# Patient Record
Sex: Female | Born: 1935 | Race: White | Hispanic: No | Marital: Married | Smoking: Never smoker
Health system: Southern US, Community
[De-identification: ages and names within clinical notes are randomized; demographics above are authoritative.]

## PROBLEM LIST (undated history)

## (undated) DIAGNOSIS — H9190 Unspecified hearing loss, unspecified ear: Secondary | ICD-10-CM

## (undated) DIAGNOSIS — K219 Gastro-esophageal reflux disease without esophagitis: Secondary | ICD-10-CM

## (undated) DIAGNOSIS — E785 Hyperlipidemia, unspecified: Secondary | ICD-10-CM

## (undated) DIAGNOSIS — R32 Unspecified urinary incontinence: Secondary | ICD-10-CM

## (undated) DIAGNOSIS — F32A Depression, unspecified: Secondary | ICD-10-CM

## (undated) DIAGNOSIS — I119 Hypertensive heart disease without heart failure: Secondary | ICD-10-CM

## (undated) DIAGNOSIS — F329 Major depressive disorder, single episode, unspecified: Secondary | ICD-10-CM

## (undated) DIAGNOSIS — R2689 Other abnormalities of gait and mobility: Secondary | ICD-10-CM

## (undated) DIAGNOSIS — G629 Polyneuropathy, unspecified: Secondary | ICD-10-CM

## (undated) DIAGNOSIS — Z973 Presence of spectacles and contact lenses: Secondary | ICD-10-CM

## (undated) DIAGNOSIS — J441 Chronic obstructive pulmonary disease with (acute) exacerbation: Secondary | ICD-10-CM

## (undated) DIAGNOSIS — IMO0001 Reserved for inherently not codable concepts without codable children: Secondary | ICD-10-CM

## (undated) DIAGNOSIS — D128 Benign neoplasm of rectum: Secondary | ICD-10-CM

## (undated) DIAGNOSIS — E2749 Other adrenocortical insufficiency: Secondary | ICD-10-CM

## (undated) DIAGNOSIS — J42 Unspecified chronic bronchitis: Secondary | ICD-10-CM

## (undated) DIAGNOSIS — I10 Essential (primary) hypertension: Secondary | ICD-10-CM

## (undated) DIAGNOSIS — G20A1 Parkinson's disease without dyskinesia, without mention of fluctuations: Secondary | ICD-10-CM

## (undated) DIAGNOSIS — G20 Parkinson's disease: Secondary | ICD-10-CM

## (undated) DIAGNOSIS — Z972 Presence of dental prosthetic device (complete) (partial): Secondary | ICD-10-CM

## (undated) DIAGNOSIS — IMO0002 Reserved for concepts with insufficient information to code with codable children: Secondary | ICD-10-CM

## (undated) DIAGNOSIS — J449 Chronic obstructive pulmonary disease, unspecified: Secondary | ICD-10-CM

## (undated) DIAGNOSIS — M1990 Unspecified osteoarthritis, unspecified site: Secondary | ICD-10-CM

## (undated) HISTORY — DX: Chronic obstructive pulmonary disease with (acute) exacerbation: J44.1

## (undated) HISTORY — DX: Reserved for concepts with insufficient information to code with codable children: IMO0002

## (undated) HISTORY — PX: FINGER SURGERY: SHX640

## (undated) HISTORY — PX: APPENDECTOMY: SHX54

## (undated) HISTORY — PX: RECTAL SURGERY: SHX760

## (undated) HISTORY — DX: Hypertensive heart disease without heart failure: I11.9

## (undated) HISTORY — PX: INCONTINENCE SURGERY: SHX676

## (undated) HISTORY — DX: Benign neoplasm of rectum: D12.8

## (undated) HISTORY — PX: DILATION AND CURETTAGE OF UTERUS: SHX78

## (undated) HISTORY — PX: EYE SURGERY: SHX253

## (undated) HISTORY — PX: TONSILLECTOMY: SUR1361

## (undated) HISTORY — DX: Presence of dental prosthetic device (complete) (partial): Z97.2

## (undated) HISTORY — DX: Unspecified urinary incontinence: R32

## (undated) HISTORY — DX: Essential (primary) hypertension: I10

## (undated) HISTORY — PX: ABDOMINAL HYSTERECTOMY: SHX81

## (undated) HISTORY — DX: Hyperlipidemia, unspecified: E78.5

---

## 1998-02-07 ENCOUNTER — Emergency Department (HOSPITAL_COMMUNITY): Admission: EM | Admit: 1998-02-07 | Discharge: 1998-02-07 | Payer: Self-pay | Admitting: Emergency Medicine

## 1998-05-01 ENCOUNTER — Ambulatory Visit (HOSPITAL_COMMUNITY): Admission: RE | Admit: 1998-05-01 | Discharge: 1998-05-01 | Payer: Self-pay | Admitting: Internal Medicine

## 1999-03-18 ENCOUNTER — Ambulatory Visit (HOSPITAL_BASED_OUTPATIENT_CLINIC_OR_DEPARTMENT_OTHER): Admission: RE | Admit: 1999-03-18 | Discharge: 1999-03-18 | Payer: Self-pay | Admitting: Orthopedic Surgery

## 1999-05-20 ENCOUNTER — Ambulatory Visit (HOSPITAL_COMMUNITY): Admission: RE | Admit: 1999-05-20 | Discharge: 1999-05-20 | Payer: Self-pay | Admitting: Internal Medicine

## 1999-05-20 ENCOUNTER — Encounter: Payer: Self-pay | Admitting: Internal Medicine

## 1999-10-18 ENCOUNTER — Encounter: Payer: Self-pay | Admitting: Emergency Medicine

## 1999-10-18 ENCOUNTER — Emergency Department (HOSPITAL_COMMUNITY): Admission: EM | Admit: 1999-10-18 | Discharge: 1999-10-18 | Payer: Self-pay | Admitting: Emergency Medicine

## 2000-09-21 ENCOUNTER — Ambulatory Visit (HOSPITAL_COMMUNITY): Admission: RE | Admit: 2000-09-21 | Discharge: 2000-09-21 | Payer: Self-pay | Admitting: Internal Medicine

## 2000-09-21 ENCOUNTER — Encounter: Payer: Self-pay | Admitting: Internal Medicine

## 2000-09-28 ENCOUNTER — Ambulatory Visit (HOSPITAL_BASED_OUTPATIENT_CLINIC_OR_DEPARTMENT_OTHER): Admission: RE | Admit: 2000-09-28 | Discharge: 2000-09-28 | Payer: Self-pay | Admitting: Orthopedic Surgery

## 2001-06-15 ENCOUNTER — Encounter: Payer: Self-pay | Admitting: Internal Medicine

## 2001-06-15 ENCOUNTER — Encounter: Admission: RE | Admit: 2001-06-15 | Discharge: 2001-06-15 | Payer: Self-pay | Admitting: Internal Medicine

## 2001-09-27 ENCOUNTER — Ambulatory Visit (HOSPITAL_COMMUNITY): Admission: RE | Admit: 2001-09-27 | Discharge: 2001-09-27 | Payer: Self-pay | Admitting: Internal Medicine

## 2001-09-27 ENCOUNTER — Encounter: Payer: Self-pay | Admitting: Internal Medicine

## 2001-10-03 ENCOUNTER — Ambulatory Visit (HOSPITAL_COMMUNITY): Admission: RE | Admit: 2001-10-03 | Discharge: 2001-10-03 | Payer: Self-pay | Admitting: Gastroenterology

## 2002-09-28 ENCOUNTER — Encounter: Payer: Self-pay | Admitting: Internal Medicine

## 2002-09-28 ENCOUNTER — Ambulatory Visit (HOSPITAL_COMMUNITY): Admission: RE | Admit: 2002-09-28 | Discharge: 2002-09-28 | Payer: Self-pay | Admitting: Internal Medicine

## 2003-04-27 ENCOUNTER — Encounter: Payer: Self-pay | Admitting: Preventative Medicine

## 2003-04-27 ENCOUNTER — Ambulatory Visit (HOSPITAL_COMMUNITY): Admission: RE | Admit: 2003-04-27 | Discharge: 2003-04-27 | Payer: Self-pay | Admitting: Preventative Medicine

## 2003-10-02 ENCOUNTER — Ambulatory Visit (HOSPITAL_COMMUNITY): Admission: RE | Admit: 2003-10-02 | Discharge: 2003-10-02 | Payer: Self-pay | Admitting: Internal Medicine

## 2003-10-08 ENCOUNTER — Ambulatory Visit (HOSPITAL_BASED_OUTPATIENT_CLINIC_OR_DEPARTMENT_OTHER): Admission: RE | Admit: 2003-10-08 | Discharge: 2003-10-08 | Payer: Self-pay | Admitting: Urology

## 2003-10-08 ENCOUNTER — Ambulatory Visit (HOSPITAL_COMMUNITY): Admission: RE | Admit: 2003-10-08 | Discharge: 2003-10-08 | Payer: Self-pay | Admitting: Urology

## 2004-03-10 ENCOUNTER — Ambulatory Visit (HOSPITAL_COMMUNITY): Admission: RE | Admit: 2004-03-10 | Discharge: 2004-03-10 | Payer: Self-pay | Admitting: Family Medicine

## 2004-10-08 ENCOUNTER — Ambulatory Visit (HOSPITAL_COMMUNITY): Admission: RE | Admit: 2004-10-08 | Discharge: 2004-10-08 | Payer: Self-pay | Admitting: Internal Medicine

## 2004-12-28 ENCOUNTER — Emergency Department (HOSPITAL_COMMUNITY): Admission: EM | Admit: 2004-12-28 | Discharge: 2004-12-28 | Payer: Self-pay | Admitting: Emergency Medicine

## 2005-01-05 ENCOUNTER — Ambulatory Visit (HOSPITAL_COMMUNITY): Admission: RE | Admit: 2005-01-05 | Discharge: 2005-01-05 | Payer: Self-pay | Admitting: Internal Medicine

## 2005-06-25 ENCOUNTER — Ambulatory Visit (HOSPITAL_COMMUNITY): Admission: RE | Admit: 2005-06-25 | Discharge: 2005-06-25 | Payer: Self-pay | Admitting: Internal Medicine

## 2005-07-29 ENCOUNTER — Ambulatory Visit (HOSPITAL_COMMUNITY): Admission: RE | Admit: 2005-07-29 | Discharge: 2005-07-29 | Payer: Self-pay | Admitting: Internal Medicine

## 2005-08-10 ENCOUNTER — Ambulatory Visit: Payer: Self-pay | Admitting: Internal Medicine

## 2005-08-19 ENCOUNTER — Ambulatory Visit: Payer: Self-pay | Admitting: *Deleted

## 2005-08-31 ENCOUNTER — Ambulatory Visit: Payer: Self-pay | Admitting: Internal Medicine

## 2005-09-09 ENCOUNTER — Ambulatory Visit (HOSPITAL_COMMUNITY): Admission: RE | Admit: 2005-09-09 | Discharge: 2005-09-09 | Payer: Self-pay | Admitting: Internal Medicine

## 2005-09-15 ENCOUNTER — Ambulatory Visit: Payer: Self-pay | Admitting: Internal Medicine

## 2005-09-17 ENCOUNTER — Ambulatory Visit (HOSPITAL_COMMUNITY): Admission: RE | Admit: 2005-09-17 | Discharge: 2005-09-17 | Payer: Self-pay | Admitting: Internal Medicine

## 2005-09-29 ENCOUNTER — Ambulatory Visit: Payer: Self-pay | Admitting: Internal Medicine

## 2005-10-06 ENCOUNTER — Ambulatory Visit: Payer: Self-pay | Admitting: Internal Medicine

## 2005-10-27 ENCOUNTER — Ambulatory Visit: Payer: Self-pay | Admitting: Internal Medicine

## 2005-11-23 ENCOUNTER — Ambulatory Visit: Payer: Self-pay | Admitting: Internal Medicine

## 2005-12-15 ENCOUNTER — Ambulatory Visit: Payer: Self-pay | Admitting: Internal Medicine

## 2006-02-15 ENCOUNTER — Ambulatory Visit: Payer: Self-pay | Admitting: Internal Medicine

## 2006-03-09 ENCOUNTER — Ambulatory Visit (HOSPITAL_COMMUNITY): Admission: RE | Admit: 2006-03-09 | Discharge: 2006-03-09 | Payer: Self-pay | Admitting: Internal Medicine

## 2006-06-14 ENCOUNTER — Ambulatory Visit (HOSPITAL_COMMUNITY): Admission: RE | Admit: 2006-06-14 | Discharge: 2006-06-15 | Payer: Self-pay | Admitting: Urology

## 2006-08-16 ENCOUNTER — Ambulatory Visit: Payer: Self-pay | Admitting: Internal Medicine

## 2006-08-26 ENCOUNTER — Ambulatory Visit: Payer: Self-pay | Admitting: Gastroenterology

## 2006-08-31 ENCOUNTER — Ambulatory Visit (HOSPITAL_BASED_OUTPATIENT_CLINIC_OR_DEPARTMENT_OTHER): Admission: RE | Admit: 2006-08-31 | Discharge: 2006-09-01 | Payer: Self-pay | Admitting: Orthopedic Surgery

## 2006-09-06 ENCOUNTER — Encounter: Payer: Self-pay | Admitting: Gastroenterology

## 2006-09-06 ENCOUNTER — Ambulatory Visit: Payer: Self-pay | Admitting: Gastroenterology

## 2006-09-14 ENCOUNTER — Ambulatory Visit (HOSPITAL_COMMUNITY): Admission: RE | Admit: 2006-09-14 | Discharge: 2006-09-14 | Payer: Self-pay | Admitting: Gastroenterology

## 2006-09-27 ENCOUNTER — Ambulatory Visit: Payer: Self-pay | Admitting: Gastroenterology

## 2006-10-19 DIAGNOSIS — R2689 Other abnormalities of gait and mobility: Secondary | ICD-10-CM

## 2006-10-19 HISTORY — DX: Other abnormalities of gait and mobility: R26.89

## 2007-03-21 ENCOUNTER — Ambulatory Visit (HOSPITAL_COMMUNITY): Admission: RE | Admit: 2007-03-21 | Discharge: 2007-03-21 | Payer: Self-pay | Admitting: Internal Medicine

## 2007-04-09 ENCOUNTER — Emergency Department (HOSPITAL_COMMUNITY): Admission: EM | Admit: 2007-04-09 | Discharge: 2007-04-09 | Payer: Self-pay | Admitting: *Deleted

## 2008-03-21 ENCOUNTER — Ambulatory Visit (HOSPITAL_COMMUNITY): Admission: RE | Admit: 2008-03-21 | Discharge: 2008-03-21 | Payer: Self-pay | Admitting: Internal Medicine

## 2008-03-29 ENCOUNTER — Encounter: Admission: RE | Admit: 2008-03-29 | Discharge: 2008-03-29 | Payer: Self-pay | Admitting: Internal Medicine

## 2008-04-11 ENCOUNTER — Encounter: Admission: RE | Admit: 2008-04-11 | Discharge: 2008-04-11 | Payer: Self-pay | Admitting: Neurology

## 2008-06-07 ENCOUNTER — Encounter (HOSPITAL_COMMUNITY): Admission: RE | Admit: 2008-06-07 | Discharge: 2008-07-07 | Payer: Self-pay | Admitting: Neurology

## 2008-07-10 ENCOUNTER — Encounter (HOSPITAL_COMMUNITY): Admission: RE | Admit: 2008-07-10 | Discharge: 2008-07-18 | Payer: Self-pay | Admitting: Neurology

## 2008-07-19 ENCOUNTER — Encounter (HOSPITAL_COMMUNITY): Admission: RE | Admit: 2008-07-19 | Discharge: 2008-08-18 | Payer: Self-pay | Admitting: Neurology

## 2008-09-03 ENCOUNTER — Ambulatory Visit (HOSPITAL_COMMUNITY): Admission: RE | Admit: 2008-09-03 | Discharge: 2008-09-03 | Payer: Self-pay | Admitting: Internal Medicine

## 2009-04-15 ENCOUNTER — Ambulatory Visit (HOSPITAL_COMMUNITY): Admission: RE | Admit: 2009-04-15 | Discharge: 2009-04-15 | Payer: Self-pay | Admitting: Internal Medicine

## 2009-07-03 ENCOUNTER — Encounter (INDEPENDENT_AMBULATORY_CARE_PROVIDER_SITE_OTHER): Payer: Self-pay | Admitting: General Surgery

## 2009-07-03 ENCOUNTER — Ambulatory Visit (HOSPITAL_COMMUNITY): Admission: RE | Admit: 2009-07-03 | Discharge: 2009-07-03 | Payer: Self-pay | Admitting: General Surgery

## 2009-07-18 ENCOUNTER — Encounter: Admission: RE | Admit: 2009-07-18 | Discharge: 2009-07-18 | Payer: Self-pay | Admitting: General Surgery

## 2009-08-21 ENCOUNTER — Inpatient Hospital Stay (HOSPITAL_COMMUNITY): Admission: EM | Admit: 2009-08-21 | Discharge: 2009-08-22 | Payer: Self-pay | Admitting: Emergency Medicine

## 2010-04-16 ENCOUNTER — Ambulatory Visit (HOSPITAL_COMMUNITY): Admission: RE | Admit: 2010-04-16 | Discharge: 2010-04-16 | Payer: Self-pay | Admitting: Internal Medicine

## 2010-11-08 ENCOUNTER — Encounter: Payer: Self-pay | Admitting: Internal Medicine

## 2010-11-09 ENCOUNTER — Encounter: Payer: Self-pay | Admitting: Internal Medicine

## 2011-01-15 ENCOUNTER — Other Ambulatory Visit: Payer: Self-pay

## 2011-01-21 LAB — LIPID PANEL
Cholesterol: 179 mg/dL (ref 0–200)
LDL Cholesterol: 85 mg/dL (ref 0–99)
Triglycerides: 60 mg/dL (ref <150)
VLDL: 12 mg/dL (ref 0–40)

## 2011-01-21 LAB — POCT I-STAT, CHEM 8
Calcium, Ion: 1.06 mmol/L — ABNORMAL LOW (ref 1.12–1.32)
Glucose, Bld: 101 mg/dL — ABNORMAL HIGH (ref 70–99)
HCT: 50 % — ABNORMAL HIGH (ref 36.0–46.0)
Hemoglobin: 17 g/dL — ABNORMAL HIGH (ref 12.0–15.0)
TCO2: 26 mmol/L (ref 0–100)

## 2011-01-21 LAB — CBC
HCT: 48 % — ABNORMAL HIGH (ref 36.0–46.0)
Hemoglobin: 16.4 g/dL — ABNORMAL HIGH (ref 12.0–15.0)
MCHC: 34.1 g/dL (ref 30.0–36.0)
MCV: 92.2 fL (ref 78.0–100.0)
Platelets: 241 10*3/uL (ref 150–400)
RBC: 5.21 MIL/uL — ABNORMAL HIGH (ref 3.87–5.11)
RDW: 13.7 % (ref 11.5–15.5)
WBC: 10.1 10*3/uL (ref 4.0–10.5)

## 2011-01-21 LAB — CARDIAC PANEL(CRET KIN+CKTOT+MB+TROPI)
CK, MB: 2.4 ng/mL (ref 0.3–4.0)
Relative Index: INVALID (ref 0.0–2.5)
Troponin I: 0.03 ng/mL (ref 0.00–0.06)

## 2011-01-21 LAB — POCT CARDIAC MARKERS: Troponin i, poc: <0.05 ng/mL (ref 0.00–0.09)

## 2011-01-21 LAB — DIFFERENTIAL
Basophils Absolute: 0 10*3/uL (ref 0.0–0.1)
Basophils Relative: 0 % (ref 0–1)
Eosinophils Absolute: 0 10*3/uL (ref 0.0–0.7)
Eosinophils Relative: 0 % (ref 0–5)
Monocytes Absolute: 0.6 10*3/uL (ref 0.1–1.0)
Monocytes Relative: 6 % (ref 3–12)
Neutro Abs: 7.8 10*3/uL — ABNORMAL HIGH (ref 1.7–7.7)

## 2011-01-21 LAB — HEMOGLOBIN A1C
Hgb A1c MFr Bld: 5.8 % (ref 4.6–6.1)
Mean Plasma Glucose: 120 mg/dL

## 2011-01-21 LAB — D-DIMER, QUANTITATIVE: D-Dimer, Quant: 3.03 ug/mL-FEU — ABNORMAL HIGH (ref 0.00–0.48)

## 2011-01-21 LAB — CK TOTAL AND CKMB (NOT AT ARMC): CK, MB: 2.6 ng/mL (ref 0.3–4.0)

## 2011-01-23 LAB — DIFFERENTIAL
Lymphocytes Relative: 30 % (ref 12–46)
Monocytes Absolute: 0.5 10*3/uL (ref 0.1–1.0)
Monocytes Relative: 7 % (ref 3–12)
Neutro Abs: 3.4 10*3/uL (ref 1.7–7.7)

## 2011-01-23 LAB — CBC
HCT: 42 % (ref 36.0–46.0)
Platelets: 259 10*3/uL (ref 150–400)
RDW: 13.6 % (ref 11.5–15.5)

## 2011-01-23 LAB — COMPREHENSIVE METABOLIC PANEL
AST: 21 U/L (ref 0–37)
Albumin: 3.9 g/dL (ref 3.5–5.2)
BUN: 18 mg/dL (ref 6–23)
Creatinine, Ser: 1.07 mg/dL (ref 0.4–1.2)
GFR calc Af Amer: >60 mL/min (ref >60)
Potassium: 3.7 mEq/L (ref 3.5–5.1)
Total Protein: 5.7 g/dL — ABNORMAL LOW (ref 6.0–8.3)

## 2011-02-03 ENCOUNTER — Other Ambulatory Visit (HOSPITAL_COMMUNITY): Payer: Self-pay | Admitting: Internal Medicine

## 2011-02-03 ENCOUNTER — Ambulatory Visit (HOSPITAL_COMMUNITY)
Admission: RE | Admit: 2011-02-03 | Discharge: 2011-02-03 | Disposition: A | Discharge status: Home / Self Care | Payer: Medicare Other | Source: Ambulatory Visit | Attending: Internal Medicine | Admitting: Internal Medicine

## 2011-02-03 DIAGNOSIS — I10 Essential (primary) hypertension: Secondary | ICD-10-CM | POA: Insufficient documentation

## 2011-02-03 DIAGNOSIS — J4489 Other specified chronic obstructive pulmonary disease: Secondary | ICD-10-CM | POA: Insufficient documentation

## 2011-02-03 DIAGNOSIS — Z0000 Encounter for general adult medical examination without abnormal findings: Secondary | ICD-10-CM | POA: Insufficient documentation

## 2011-02-03 DIAGNOSIS — J449 Chronic obstructive pulmonary disease, unspecified: Secondary | ICD-10-CM | POA: Insufficient documentation

## 2011-02-18 ENCOUNTER — Other Ambulatory Visit (HOSPITAL_COMMUNITY): Payer: Self-pay | Admitting: Urology

## 2011-02-18 DIAGNOSIS — R109 Unspecified abdominal pain: Secondary | ICD-10-CM

## 2011-02-23 ENCOUNTER — Ambulatory Visit (HOSPITAL_COMMUNITY)
Admission: RE | Admit: 2011-02-23 | Discharge: 2011-02-23 | Disposition: A | Discharge status: Home / Self Care | Payer: Medicare Other | Source: Ambulatory Visit | Attending: Urology | Admitting: Urology

## 2011-02-23 ENCOUNTER — Other Ambulatory Visit (HOSPITAL_COMMUNITY): Payer: Self-pay | Admitting: Urology

## 2011-02-23 DIAGNOSIS — N949 Unspecified condition associated with female genital organs and menstrual cycle: Secondary | ICD-10-CM | POA: Insufficient documentation

## 2011-02-23 DIAGNOSIS — Z90710 Acquired absence of both cervix and uterus: Secondary | ICD-10-CM | POA: Insufficient documentation

## 2011-02-23 DIAGNOSIS — K5730 Diverticulosis of large intestine without perforation or abscess without bleeding: Secondary | ICD-10-CM | POA: Insufficient documentation

## 2011-02-23 DIAGNOSIS — R109 Unspecified abdominal pain: Secondary | ICD-10-CM

## 2011-03-06 ENCOUNTER — Encounter (INDEPENDENT_AMBULATORY_CARE_PROVIDER_SITE_OTHER): Payer: Self-pay | Admitting: General Surgery

## 2011-03-06 NOTE — Procedures (Signed)
[facility]  Patient:    Marie [name], Marie [name] Visit Number: [id number] MRN: [id number]          Service Type: END Location: ENDO Attending Physician:  Louie Bun Dictated by:   Everardo All Madilyn Fireman, M.D. Proc. Date: 10/03/01 Admit Date:  10/03/2001   CC:         [name], M.D.   Procedure Report  PROCEDURE:  Colonoscopy.  SURGEON:  John C. Madilyn Fireman, M.D.  INDICATIONS FOR PROCEDURE:  Screening colonoscopy in a 75 year old patient.  DESCRIPTION OF PROCEDURE:  The patient was placed in the left lateral decubitus position and placed on the pulse monitor with continuous low flow oxygen delivered by nasal cannula.  She was sedated with 100 mcg and IV fentanyl and 10 mg of IV Versed.  The Olympus video colonoscope was inserted into the rectum and advanced with difficulty due to looping and tortuosity to the mid ascending colon.  I could see the ileocecal valve and about two-thirds to the cecum beyond it, but could not advance all the way deep into the cecum, and thus, view of the cecum was somewhat limited.  The visualized areas of the cecum appeared normal as did the ascending, transverse, descending, and sigmoid colon.  No masses, polyps, diverticula, or other mucosal abnormalities were noted.  The rectum likewise appeared normal and a retroflexed view of the anus revealed no obviously enlarged internal hemorrhoids.  The colonoscope was then withdrawn and the patient returned to the recovery room in stable condition.  She tolerated the procedure well and there were no immediate complications.  IMPRESSION:  Essentially normal colonoscopy with limited view to the cecum.  PLAN:  Flexible sigmoidoscopy in five years. Dictated by:   Everardo All Madilyn Fireman, M.D. Attending Physician:  Louie Bun DD:  10/03/01 TD:  10/04/01 Job: [id number]

## 2011-03-06 NOTE — Assessment & Plan Note (Signed)
[facility]                           GASTROENTEROLOGY OFFICE NOTE   NAME:HYLERShagun, Wordell                     MRN:          [id number]  DATE:08/26/2006                            DOB:          06-30-1936    REASON FOR CONSULTATION:  Chronic cough.  Mrs. Bertram is a pleasant 75-year-  old white female referred through the courtesy of Dr. Sherene Sires and Dr. Maple Hudson for  evaluation.  She has been suffering from a chronic nonproductive cough.  With severe coughing, she may regurgitate some gastric contents.  She  complains of a tickling sensation when she swallows, which sets off her  cough.  She denies pyrosis.  She takes omeprazole twice a day.  Recent  barium swallow demonstrated a Schatzki's ring and severe dysmotility.  There  was no obstruction to a barium pill.  She has occasional hoarseness and a  sore throat.   PAST MEDICAL HISTORY:  Pertinent for hypertension.  She has degenerative  arthritis.  Has had joint replacements in her hand.  She is status post  hysterectomy and appendectomy.   FAMILY HISTORY:  Pertinent for a sister with ovarian cancer and father with  heart disease.   MEDICATIONS:  1. Omeprazole 20 mg q.a.m. and nightly.  2. Simvastatin.  3. Estradiol.  4. Hydrochlorothiazide.  5. Baby aspirin.  6. Bisoprolol.  7. Advair.   She has no allergies.   She neither smokes nor drinks.  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   REVIEW OF SYSTEMS:  Positive for joint pains and fatigue.   EXAM:  Pulse 80, blood pressure 110/70, weight 164.   PHYSICAL EXAMINATION:  HEENT:  EOMI. PERRLA. Sclerae are anicteric.  Conjunctivae are pink.  NECK:  Supple without thyromegaly, adenopathy or carotid bruits.  CHEST:  Clear to auscultation and percussion without adventitious sounds.  CARDIAC:  Regular rhythm; normal S1 S2.  There are no murmurs, gallops or  rubs.  ABDOMEN:  Bowel sounds are normoactive.  Abdomen is soft, non-tender and non-  distended.  There are no abdominal masses, tenderness, splenic enlargement  or hepatomegaly.  EXTREMITIES:  Full range of motion.  No cyanosis, clubbing or edema.  RECTAL:  Deferred.   IMPRESSION:  Chronic cough.  This could be due to esophageal irritation from  acid reflux.  She does have a distal esophageal stricture (Schatzki's ring)  though it does not appear to be obstructing.  She appears to have  dysmotility, which could be causing some mild aspiration.   RECOMMENDATION:  1. Switch omeprazole to 20 mg q.a.m. and 20 mg 1/2 hour prior to dinner.  2. Upper endoscopy with dilatation if indicated.  3. To consider dysphagia study to determine whether she has any      aspiration.     Barbette Hair. Arlyce Dice, MD,FACG  Electronically Signed    RDK/MedQ  DD: 08/26/2006  DT: 08/26/2006  Job #: [id number]

## 2011-03-06 NOTE — Op Note (Signed)
NAMEJONEL, Marie [name]              ACCOUNT NO.:  [id number]   MEDICAL RECORD NO.:  [id number]          PATIENT TYPE:  AMB   LOCATION:  DSC                          FACILITY:  [facility]   PHYSICIAN:  Katy Fitch. Sypher, M.D. DATE OF BIRTH:  28-Feb-1936   DATE OF PROCEDURE:  08/31/2006  DATE OF DISCHARGE:                                 OPERATIVE REPORT   PREOPERATIVE DIAGNOSES:  1. 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  2. 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   POSTOPERATIVE DIAGNOSES:  1. 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  2. 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   OPERATION:  1. Resection of left trapezium with synovectomy and removal of loose      bodies.  2. Tightrope suspension plasty operation.  3. Suspending left thumb off of index metacarpal.  4. Reconstruction of thumb index intermetacarpal ligament utilizing a free      palmaris longus tendon graft.  5. His arthrodesis of left long finger proximal interphalangeal joint.  6. Injection of right thumb carpometacarpal joint with Depo-Medrol and      lidocaine.   OPERATING SURGEON:  Josephine Igo, MD   ASSISTANT:  Annye Rusk PA-C.   ANESTHESIA:  General by LMA.   SUPERVISING ANESTHESIOLOGIST:  Dr. Krista Blue.   INDICATIONS:  Marie [name] is 75 year old right-hand-dominant woman well-  known to our practice.  She has had multiple prior procedures including  implant arthroplasty of the distal interphalangeal joints of the right ring  and small fingers and arthrodesis of her left index, ring and small finger  PIP joints.   We have followed her for rather severe bilateral thumb arthrosis.  She has  now reached the state where she would like to proceed with arthrodesis of  her final long finger DIP joint on the left  and reconstruction of her left  thumb CMC joint due to chronic pain due to bone-on-bone arthropathy.   After informed consent, Marie [name] is brought to the operating room at this  time.   Preoperatively, Marie [name] requested that we inject her right thumb CMC joint  while under anesthesia.   After informed consent, she was permitted for the aforementioned procedures  and is brought to the operating room at this time.   PROCEDURE:  Marie [name] is brought to the operating room and placed in  supine position on the operating table.   Following an anesthesia consult by Dr. Krista Blue, general anesthesia by LMA  technique was selected.   Marie [name] is brought to room 6, placed supine position on the table and  placed under general anesthesia under Dr. Theodoro Doing' direct supervision.  One  gram of Ancef was administered as an IV prophylactic antibiotic.   The procedure commenced with injection of the right thumb CMC joint with a  mixture of 1 mL of Depo-Medrol 40 mg per mL and 1 mL of 2% lidocaine.  This  was uncomplicated.  A Band-Aid was applied.  The left arm was exsanguinated  with and Esmarch bandage arterial tourniquet at the proximal brachium  inflated to 220 mmHg.   The procedure commenced in curvilinear Wagner incision exposing the thenar  muscles.  The interval between the two abductor pollicis longus tendon slips  was sharply incised and the thenar muscles elevated with a flap of  periosteum.  The carpometacarpal joint capsule was entered and the trapezium  exposed subperiosteally.  The entire trapezium was morselize and removed  piecemeal with careful preservation of the flexor carpi radialis tendon.  After complete synovectomy, multiple loose bodies removed from the thumb  carpometacarpal joint followed by a complete synovectomy.   The scaphotrapezoid joint was inspected and found to be significantly  degenerative; however, there was good synovial fluid present and some   residual cartilage.  Therefore, we did not resect the proximal portion of  the trapezoid.   Drill holes were created to the base of the index metacarpal and thumb  metacarpal utilizing the drill guide and K-wire for the tightrope set.   A mini tightrope was then passed through the drill holes with modification  with two sutures of #2 FiberWire used to tunnel the tendon grafts into the  index and thumb metacarpals.   The palmaris longus was harvested through a short transverse incision was  noted to be a very satisfactory graft.  This measured 4 mm in width and  approximate 1.5 mm in thickness.   After the drill holes were carefully cleaned of all cancellus bone, the #2  FiberWire was tensioned appropriately and the 2-0 FiberWire sutures were  used to pull loops of the palmaris longus into the thumb metacarpal and the  index metacarpal, creating a tendon bridge.   A overhand knot was tied between creating interposition between the thumb  and index metacarpals and subsequently repeatedly knotted with a spiral  technique creating a large interposition tendon graft.   The free ends were then tied in an overhand knot and secured with two  sutures of 3-0 Ethilon.   The tight rope was then properly tensioned to suspend the thumb in an  anatomic position followed by tying a four knot stacks, securing the tight  rope reconstruction.   The wounds were then irrigated followed by repair of the thenar muscles over  the tightrope button and anatomic repair of the interval between the  abductor pollicis longus tendon slips and the thenar muscles.   The palmaris longus wound was repaired with intradermal 3-0 Prolene and  Steri-Strip as was the dorsal incision used to properly seat the index  anchor.   Attention directed to the left long finger.  A paired Mercedes incision was fashioned exposing extensor mechanism.  The dorsal veins were preserved.  The extensor was incised transversely and  the DIP joint opened shotgun  style.  The collateral ligaments resected followed by meticulous cup and  cone arthrodesis technique, removing the periarticular osteophytes and  tailoring the subchondral bone for a congruous fit.   The joint was placed in approximately 10 degrees of flexion, followed by use  of three 0.035-inch Kirschner wires to secure the joint in proper slight  supination, neutral angulation and 10 degrees flexion.   After C-arm fluoroscopy confirmed satisfactory position of the PIP joint and  Kirschner wires, the extensor mechanism was repaired with multiple mattress  sutures of 4-0 Vicryl with knots buried.  The skin was repaired with  interrupted suture  of 5-0 nylon.   The tourniquet was released for a total tourniquet time of 2 hours at 220  mmHg.  There were no apparent complications.  Marie [name] tolerated surgery  and anesthesia well.   She was transferred to the recovery room with stable signs.      Katy Fitch Sypher, M.D.  Electronically Signed     RVS/MEDQ  D:  08/31/2006  T:  08/31/2006  Job:  [id number]

## 2011-03-06 NOTE — Op Note (Signed)
[facility]  Patient:    Marie [name], Marie [name]                     MRN: [id number] Proc. Date: 09/28/00 Adm. Date:  [id number] Attending:  Susa Day CC:         Katy Fitch. Naaman Plummer., M.D.   Operative Report  PREOPERATIVE DIAGNOSIS:  Severe degenerative arthritis, right long finger distal interphalangeal joint.  POSTOPERATIVE DIAGNOSIS:  Severe degenerative arthritis, right long finger distal interphalangeal joint.  OPERATION PERFORMED:  Arthrodesis, right long finger distal interphalangeal joint with 0.035 inch Kirschner wire fixation.  SURGEON:  Katy Fitch. Sypher, Montez Hageman., M.D.  ASSISTANT:  Jonni Sanger, P.A.  ANESTHESIA:  Axillary block.  SUPERVISING ANESTHESIOLOGIST:  Dr. Michelle Piper.  INDICATIONS FOR PROCEDURE:  The patient is a 75 year old woman who has had longstanding osteoarthritis affecting her small joints of her hands.  She is status post implant arthroplasty and arthrodesis of multiple distal interphalangeal joints.  Recently, she developed painful arthrosis of her right long finger DIP joint and requested arthrodesis for pain control and to aid her recovery of strong prehension.  She is very familiar with surgery having undergone previous arthrodesis multiple times in the past.  She is scheduled at this time for arthrodesis of her right long finger DIP joint.  DESCRIPTION OF PROCEDURE:  [name] was brought to the operating room and placed in supine position on the operating table.  Following placement of an axillary block, the right arm was prepped with Betadine soap and solution and sterilely draped.  One gram of Ancef was administered as an IV prophylactic antibiotic.  Following exsanguination of the limb with an Esmarch bandage, the arterial tourniquet was inflated with 250 mmHg.  The procedure commenced with a dorsal curvilinear incision exposing the extensor mechanism. A dorsal vein was electrocauterized  with bipolar current.  The extensor mechanism was transected 4 mm proximal to its insertion at the distal phalanx. Subcutaneous tissues were carefully divided exposing the collateral ligaments which were resected with scalpel and rongeur dissection.  A cup and cone type arthrodesis was fashioned shaping the middle phalanx head to a bullet shape and cup was created with a power bur in the distal phalanx.  Care was taken to remove as much of the osteophyte at the insertion of the collateral ligaments as possible without compromising the integrity of the distal phalanx.  The arthrodesis was then positioned at approximately 10 degrees flexion which was Ms. Hylars choice.  Two 0.035 inch Kirschner wires were placed with retrograde technique securing the DIP joint in 10 degrees flexion, neutral rotation and very slight supination to facilitate pinch with the pulp of the thumb.  This was secured and confirmed with satisfactory C-arm AP lateral fluoroscopic images.  The pins were trimmed in the usual manner and the wound repaired with mattress sutures of 4-0 Vicryl repairing the extensor tendon anatomically, followed by repair of the skin with interrupted sutures of 5-0 nylon.  The wound was dressed with collodion followed by application of voluminous gauze dressing and an Alumafoam splint.  There were in no apparent distress.  The patient tolerated the surgery and anesthesia well and was transferred to the recovery room with stable vital signs. DD:  09/28/00 TD:  09/28/00 Job: [id number]

## 2011-03-06 NOTE — Op Note (Signed)
Marie [name], Marie [name]                        ACCOUNT NO.:  [id number]   MEDICAL RECORD NO.:  [id number]                   PATIENT TYPE:  AMB   LOCATION:  NESC                                 FACILITY:  [facility]   PHYSICIAN:  Sigmund I. Patsi Sears, M.D.         DATE OF BIRTH:  [birth year]   DATE OF PROCEDURE:  10/08/2003  DATE OF DISCHARGE:                                 OPERATIVE REPORT   PREOPERATIVE DIAGNOSIS:  Stress urinary incontinence.   POSTOPERATIVE DIAGNOSIS:  Stress urinary incontinence.   OPERATION:  Mentor OB tape sling.   SURGEON:  Sigmund I. Patsi Sears, M.D.   ANESTHESIA:  General LMA.   PREPARATION:  After appropriate preanesthesia, the patient is brought to the  operating room and placed on the operating table in the dorsal supine  position where general LMA anesthesia was introduced.  She was then re-  placed in dorsal lithotomy position where the pubis was prepped with  Betadine solution and draped in the usual fashion.   DESCRIPTION OF PROCEDURE:  A 2 cm incision is made over the midline of the  urethra and subcutaneous tissue was dissected with the electrosurgical unit.  A 0.5 cm lateral to the clitoris is identified bilaterally with marking pen,  and incisions are made through the skin and subcutaneous tissue.  The Mentor  OB tape sling instrument is then passed bilaterally behind the obturator  bone, and the sling is placed without tension.  Cystoscopy reveals a normal-  appearing bladder.  The wings of the sling are then cut subcutaneously, with  a right-angle behind the sling.  The urethral incision is then closed in two  layers with 3-0 Vicryl suture.  Minimal bleeding was noted.  The patient was  awakened after given IV Toradol, taken to the recovery room in good  condition.                                               Sigmund I. Patsi Sears, M.D.    SIT/MEDQ  D:  10/08/2003  T:  10/08/2003  Job:  [id number]

## 2011-03-06 NOTE — Op Note (Signed)
NAMEGERTIE, Marie [name]              ACCOUNT NO.:  [id number]   MEDICAL RECORD NO.:  [id number]          PATIENT TYPE:  OIB   LOCATION:  1412                         FACILITY:  [facility]   PHYSICIAN:  Sigmund I. Patsi Sears, M.D.DATE OF BIRTH:  1935-12-17   DATE OF PROCEDURE:  06/14/2006  DATE OF DISCHARGE:                                 OPERATIVE REPORT   PREOPERATIVE DIAGNOSIS:  Stress incontinence.   POSTOPERATIVE DIAGNOSIS:  Stress incontinence.   PROCEDURE:  Prepubic sling (Prefyx : [employer]) and  cystoscopy.   SURGEON:  Sigmund I. Patsi Sears, M.D.   ASSISTANT:  Cornelious Bryant, M.D.   ANESTHESIA:  General.   ESTIMATED BLOOD LOSS:  Minimal.   COMPLICATIONS:  None.   INDICATIONS:  This is a 75 year old lady who has history of mixed urge and  stress incontinence.  She is status post __________ urethropexy in 1996 and  a __________ transobturator sling in 2004.  The patient continues to have  complaints of mixed stress and urge incontinence.  At this point she is  distressed about her stress incontinence and seeks further surgical  intervention to address the issue.  After extensive counseling, the patient  agreed to undergo prepubic sling insertion with cystoscopy.   DESCRIPTION OF PROCEDURE:  The patient was brought to the operating room.  Preoperative antibiotics were given.  Time out was taken to properly  identify the patient and procedure to be done.  General anesthesia was  induced.  She was placed in the dorsal lithotomy position.  She was prepped  and draped in normal sterile fashion.  A 1-cm incision was made over the mid  urethra.  This incision was done after the submucosal tissues were  infiltrated with lidocaine.  Tenotomy scissors were then used to dissect  through the retropubic space.  Using the Prefyx needle system, the needles  were placed through the vaginal incision directed 45 degrees laterally on  either side toward the inferior ramus of  the pubic bone.  Once the needle  touched the inferior aspect of the anterior pubic ramus, the needle was then  rotated medially and was brought up subcutaneously through the pubic skin  about 2 cm lateral to the symphysis pubis.  This process was done on both  sides.  The needles were then taken out leaving the blue dilator in place.  Right angle was then applied on the middle of the urethra and the sling was  then pulled through the pubic puncture hole until the sling snugged against  the urethra.  The  protective plastic covering was then removed leaving the  sling in place.  Cystoscopy was performed with the 12 and the 70-degree lens  and there was no bladder or urethral injury.  The extra string that was  present about the pubic sling was then cut and the wounds were closed with  Dermabond.  The __________ incision was then closed using 3-0 Vicryl in a  running fashion.  Foley catheter was inserted and vaginal pack with estrogen  cream was inserted.  The patient was then admitted for 23-hour  observation in the  hospital.  The procedure was terminated with no  complications.  The patient was awakened from anesthesia and extubated in  the operating room and taken in stable condition to PACU.  Please note Dr.  Patsi Sears was present __________ entire procedure as he was the responsible  surgeon.     ______________________________  Cornelious Bryant, MD      Sigmund I. Patsi Sears, M.D.  Electronically Signed    SK/MEDQ  D:  06/14/2006  T:  06/14/2006  Job:  [id number]

## 2011-03-06 NOTE — Assessment & Plan Note (Signed)
[facility]                         GASTROENTEROLOGY OFFICE NOTE   NAME:[name], Marie [name]                     MRN:          [id number]  DATE:09/27/2006                            DOB:          January 31, 1936    PROBLEM:  Cough.  [name] has returned following upper endoscopy.  This demonstrated a distal esophageal stricture, which was dilated to 18  mm.  Multiple gastric polyps were seen.  Biopsies demonstrated benign  fundic gland polyps.  [name] continues to complain of chronic  nonproductive cough.  This is despite omeprazole twice a day.   EXAM:  Pulse 64, blood pressure 106/70, weight 164.   IMPRESSION:  1. Chronic cough.  At this point, I am not convinced that this is acid-      related.  2. Distal esophageal stricture - asymptomatic following dilatation      therapy.   RECOMMENDATIONS:  Decrease omeprazole to once a day.  If cough is not  worsened, then she will attempt to discontinue her omeprazole all  together.  I carefully instructed [name] to contact me if she has  problems with pyrosis or worsening cough when lowering her omeprazole.     Barbette Hair. Arlyce Dice, MD,FACG  Electronically Signed    RDK/MedQ  DD: 09/27/2006  DT: 09/27/2006  Job #: [id number]

## 2011-03-10 ENCOUNTER — Other Ambulatory Visit (HOSPITAL_COMMUNITY): Payer: Self-pay | Admitting: Internal Medicine

## 2011-03-10 DIAGNOSIS — Z1231 Encounter for screening mammogram for malignant neoplasm of breast: Secondary | ICD-10-CM

## 2011-03-20 LAB — HM COLONOSCOPY

## 2011-04-24 ENCOUNTER — Ambulatory Visit (HOSPITAL_COMMUNITY): Payer: Medicare Other

## 2011-04-29 ENCOUNTER — Ambulatory Visit (HOSPITAL_COMMUNITY)
Admission: RE | Admit: 2011-04-29 | Discharge: 2011-04-29 | Disposition: A | Discharge status: Home / Self Care | Payer: Medicare Other | Source: Ambulatory Visit | Attending: Internal Medicine | Admitting: Internal Medicine

## 2011-04-29 DIAGNOSIS — Z1231 Encounter for screening mammogram for malignant neoplasm of breast: Secondary | ICD-10-CM | POA: Insufficient documentation

## 2011-04-30 ENCOUNTER — Ambulatory Visit (HOSPITAL_COMMUNITY): Payer: Medicare Other

## 2011-05-13 ENCOUNTER — Other Ambulatory Visit (HOSPITAL_COMMUNITY): Payer: Self-pay | Admitting: Internal Medicine

## 2011-05-13 ENCOUNTER — Ambulatory Visit (HOSPITAL_COMMUNITY)
Admission: RE | Admit: 2011-05-13 | Discharge: 2011-05-13 | Disposition: A | Discharge status: Home / Self Care | Payer: Medicare Other | Source: Ambulatory Visit | Attending: Internal Medicine | Admitting: Internal Medicine

## 2011-05-13 DIAGNOSIS — M47817 Spondylosis without myelopathy or radiculopathy, lumbosacral region: Secondary | ICD-10-CM | POA: Insufficient documentation

## 2011-05-13 DIAGNOSIS — M25559 Pain in unspecified hip: Secondary | ICD-10-CM | POA: Insufficient documentation

## 2011-05-13 DIAGNOSIS — W19XXXA Unspecified fall, initial encounter: Secondary | ICD-10-CM | POA: Insufficient documentation

## 2011-05-13 DIAGNOSIS — M545 Low back pain, unspecified: Secondary | ICD-10-CM | POA: Insufficient documentation

## 2011-05-13 DIAGNOSIS — M25569 Pain in unspecified knee: Secondary | ICD-10-CM

## 2011-06-11 ENCOUNTER — Ambulatory Visit (HOSPITAL_BASED_OUTPATIENT_CLINIC_OR_DEPARTMENT_OTHER)
Admission: RE | Admit: 2011-06-11 | Discharge: 2011-06-11 | Disposition: A | Discharge status: Home / Self Care | Payer: Medicare Other | Source: Ambulatory Visit | Attending: Urology | Admitting: Urology

## 2011-06-11 DIAGNOSIS — K219 Gastro-esophageal reflux disease without esophagitis: Secondary | ICD-10-CM | POA: Insufficient documentation

## 2011-06-11 DIAGNOSIS — I10 Essential (primary) hypertension: Secondary | ICD-10-CM | POA: Insufficient documentation

## 2011-06-11 DIAGNOSIS — N393 Stress incontinence (female) (male): Secondary | ICD-10-CM | POA: Insufficient documentation

## 2011-06-11 DIAGNOSIS — J42 Unspecified chronic bronchitis: Secondary | ICD-10-CM | POA: Insufficient documentation

## 2011-06-11 DIAGNOSIS — Z7982 Long term (current) use of aspirin: Secondary | ICD-10-CM | POA: Insufficient documentation

## 2011-06-11 DIAGNOSIS — R9431 Abnormal electrocardiogram [ECG] [EKG]: Secondary | ICD-10-CM | POA: Insufficient documentation

## 2011-06-11 DIAGNOSIS — Z79899 Other long term (current) drug therapy: Secondary | ICD-10-CM | POA: Insufficient documentation

## 2011-06-11 LAB — POCT I-STAT 4, (NA,K, GLUC, HGB,HCT)
Glucose, Bld: 99 mg/dL (ref 70–99)
HCT: 44 % (ref 36.0–46.0)
Hemoglobin: 15 g/dL (ref 12.0–15.0)
Potassium: 3.6 mEq/L (ref 3.5–5.1)

## 2011-06-19 NOTE — Op Note (Signed)
  Marie [name], Marie [name] NO.:  [id number]  MEDICAL RECORD NO.:  [id number]  LOCATION:                                 FACILITY:  PHYSICIAN:  Cannie Muckle I. Patsi Sears, M.D.DATE OF BIRTH:  [birth year]  DATE OF PROCEDURE:  06/11/2011 DATE OF DISCHARGE:                              OPERATIVE REPORT   PREOPERATIVE DIAGNOSIS:  Urinary incontinence.  POSTOPERATIVE DIAGNOSIS:  Urinary incontinence.  OPERATION:  Macroplastique injection, transurethral.  SURGEON:  Marlen Koman I. Patsi Sears, MD  ANESTHESIA:  General LMA.  OPERATION:  After appropriate preanesthesia, the patient was brought to the operating room, placed on the operating room in the dorsal supine position where general LMA anesthesia was induced.  She was then replaced in dorsal lithotomy position where the pubis was prepped with Betadine solution and draped in the usual fashion.  BRIEF HISTORY:  This is a 75 year old female who has a history of urinary incontinence despite multiple antiincontinence surgeries dating to 2004.  She has two-pad-a-day incontinence, with a Valsalva leak point pressure of 72 and 79 respectively.  She leaks two pads a day without sensory awareness.  She did have some improvement with physical therapy in 2008.  She is now for a Macroplastique injection to see if this will help her resolve her urinary incontinence.  DESCRIPTION OF PROCEDURE:  With the patient in the dorsal lithotomy position, vaginal inspection reveals a normal postmenopausal vagina with some mucosal atrophy.  The scope was placed in the urethra, and three vials of Macroplastique were injected in the submucosal portion of the urethra, closing the urethral orifice approximately 75%.  The patient tolerated the procedure well with no bleeding.  The bladder was drained, and the patient was awakened and taken to the recovery room in good condition.     Inez Stantz I. Patsi Sears, M.D.     SIT/MEDQ  D:  06/11/2011  T:   06/11/2011  Job:  [id number]  Electronically Signed by Jethro Bolus M.D. on 06/19/2011 03:12:19 PM

## 2011-08-05 LAB — I-STAT 8, (EC8 V) (CONVERTED LAB)
Acid-Base Excess: 1
BUN: 17
Bicarbonate: 28 — ABNORMAL HIGH
HCT: 36
Operator id: 257131
pCO2, Ven: 52.6 — ABNORMAL HIGH

## 2011-09-04 ENCOUNTER — Encounter (INDEPENDENT_AMBULATORY_CARE_PROVIDER_SITE_OTHER): Payer: Self-pay | Admitting: General Surgery

## 2011-09-04 ENCOUNTER — Ambulatory Visit (INDEPENDENT_AMBULATORY_CARE_PROVIDER_SITE_OTHER): Payer: Medicare Other | Admitting: General Surgery

## 2011-09-04 VITALS — BP 122/76 | HR 80 | Temp 96.9°F | Resp 12 | Ht 64.0 in | Wt 176.2 lb

## 2011-09-04 DIAGNOSIS — D128 Benign neoplasm of rectum: Secondary | ICD-10-CM

## 2011-09-04 NOTE — Progress Notes (Signed)
Subjective:     Patient ID: Marie [name], female   DOB: 20-Jan-1936, 75 y.o.   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 Since her last visit she has been doing fairly well. She notes that on occasion she will have a little leakage after her bowel movement. This occurs about once a month. She denies any rectal pain. She denies any bleeding in her stools.  Review of Systems  Constitutional: Negative.   HENT: Negative.   Eyes: Negative.   Respiratory: Negative.   Cardiovascular: Negative.   Gastrointestinal: Negative.   Genitourinary: Negative.   Musculoskeletal: Negative.   Skin: Negative.   Neurological: Negative.   Hematological: Bruises/bleeds easily.  Psychiatric/Behavioral: Negative.    Past Medical History  Diagnosis Date  . Hypertension   . Incontinence   . Arthritis   . Wears dentures   . Osteoporosis   . Bulging disc   . Hyperlipidemia    Past Surgical History  Procedure Date  . Incontinence surgery   . Rectal surgery     by dr. Carolynne Edouard  . Appendectomy   . Abdominal hysterectomy    Current Outpatient Prescriptions  Medication Sig Dispense Refill  . amitriptyline (ELAVIL) 10 MG tablet daily.      Marland Kitchen aspirin 81 MG tablet Take 81 mg by mouth daily.        . Cholecalciferol (VITAMIN D-3) 5000 UNITS TABS Take by mouth daily.        . Cyanocobalamin (VITAMIN B-12 PO) Take by mouth daily.        Marland Kitchen FLUoxetine (PROZAC) 40 MG capsule Take 40 mg by mouth daily.        . Fluticasone-Salmeterol (ADVAIR DISKUS) 250-50 MCG/DOSE AEPB Inhale 1 puff into the lungs every 12 (twelve) hours.        Marland Kitchen losartan-hydrochlorothiazide (HYZAAR) 100-25 MG per tablet Take 1 tablet by mouth daily.        . Multiple Vitamin (MULTIVITAMIN) capsule Take 1 capsule by mouth daily.        Marland Kitchen omeprazole (PRILOSEC) 20 MG capsule BID times 48H.      Marland Kitchen oxybutynin (DITROPAN) 5 MG  tablet daily.      . pravastatin (PRAVACHOL) 40 MG tablet daily.      . predniSONE (DELTASONE) 10 MG tablet Take 10 mg by mouth as directed.        . predniSONE (DELTASONE) 5 MG tablet Take 5 mg by mouth as directed.        Marland Kitchen TRAMADOL HCL PO Take by mouth as needed.         Allergies  Allergen Reactions  . Codeine   . Demerol   . Morphine And Related   . Sulfate        Objective:   Physical Exam  Constitutional: She is oriented to person, place, and time. She appears well-developed and well-nourished.  HENT:  Head: Normocephalic and atraumatic.  Eyes: Conjunctivae and EOM are normal. Pupils are equal, round, and reactive to light.  Neck: Normal range of motion. Neck supple.  Cardiovascular: Normal rate, regular rhythm and normal heart sounds.   Pulmonary/Chest: Effort normal and breath sounds normal.  Abdominal: Soft. Bowel sounds are normal.  Genitourinary:       Her external perirectal skin looks good. She may have a small superficial tear posteriorly. This is nontender. She has poor rectal  tone. She has a lot of stool in the rectal vault. On anoscopic exam posteriorly she does have a small 3-4 mm pedunculated mass. This is posterior.  Musculoskeletal: Normal range of motion.  Neurological: She is alert and oriented to person, place, and time.  Skin: Skin is warm and dry.  Psychiatric: She has a normal mood and affect. Her behavior is normal.       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 She now has a small mass in that same area.    Plan:     Because of her history I think this mass needs to be biopsied. She does bruise very easily and I'm afraid of rebiopsy this in the clinic that we may get into some bleeding that we can't control. I have therefore recommended that we do this biopsy in the operating room where we can control any bleeding we encounter. I discussed her in detail the risks and benefits that perspective this  as well some of the technical aspects and she understands and wishes to proceed. For her poor rectal tone we may need to refer her to a colorectal specialist

## 2011-09-04 NOTE — Patient Instructions (Signed)
Rectal prep

## 2011-09-14 ENCOUNTER — Encounter (HOSPITAL_COMMUNITY): Payer: Self-pay | Admitting: Pharmacy Technician

## 2011-09-15 ENCOUNTER — Inpatient Hospital Stay (HOSPITAL_COMMUNITY): Admission: RE | Admit: 2011-09-15 | Payer: Medicare Other | Source: Ambulatory Visit

## 2011-09-15 ENCOUNTER — Telehealth (INDEPENDENT_AMBULATORY_CARE_PROVIDER_SITE_OTHER): Payer: Self-pay | Admitting: General Surgery

## 2011-09-16 NOTE — Telephone Encounter (Signed)
Sick in what way? Probably should reschedule when she is well unless 'sickness' is minor.

## 2011-09-17 ENCOUNTER — Encounter (HOSPITAL_COMMUNITY)
Admission: RE | Admit: 2011-09-17 | Discharge: 2011-09-17 | Disposition: A | Discharge status: Home / Self Care | Payer: Medicare Other | Source: Ambulatory Visit | Attending: General Surgery | Admitting: General Surgery

## 2011-09-17 ENCOUNTER — Encounter (HOSPITAL_COMMUNITY)
Admission: RE | Admit: 2011-09-17 | Discharge: 2011-09-17 | Disposition: A | Discharge status: Home / Self Care | Payer: Medicare Other | Source: Ambulatory Visit | Attending: Anesthesiology | Admitting: Anesthesiology

## 2011-09-17 ENCOUNTER — Encounter (HOSPITAL_COMMUNITY): Payer: Self-pay

## 2011-09-17 HISTORY — DX: Gastro-esophageal reflux disease without esophagitis: K21.9

## 2011-09-17 HISTORY — DX: Depression, unspecified: F32.A

## 2011-09-17 HISTORY — DX: Polyneuropathy, unspecified: G62.9

## 2011-09-17 HISTORY — DX: Major depressive disorder, single episode, unspecified: F32.9

## 2011-09-17 LAB — BASIC METABOLIC PANEL
CO2: 32 mEq/L (ref 19–32)
Calcium: 9.6 mg/dL (ref 8.4–10.5)
Creatinine, Ser: 0.94 mg/dL (ref 0.50–1.10)

## 2011-09-17 LAB — SURGICAL PCR SCREEN: MRSA, PCR: NEGATIVE

## 2011-09-17 LAB — CBC
MCH: 32.4 pg (ref 26.0–34.0)
MCV: 96.6 fL (ref 78.0–100.0)
Platelets: 333 10*3/uL (ref 150–400)
RBC: 4.75 MIL/uL (ref 3.87–5.11)

## 2011-09-17 NOTE — Progress Notes (Signed)
Contacted patients pcp office, Dr. Kendrick Fries, Spoke with Irving Burton requested last office visit notes, and EKG.

## 2011-09-17 NOTE — Pre-Procedure Instructions (Signed)
20 Marie [name]  09/17/2011   Your procedure is scheduled on:  Monday September 21, 2011  Report to [facility].  Call this number if you have problems the morning of surgery: [phone]   Remember:   Do not eat food:After Midnight.  May have clear liquids: up to 4 Hours before arrival.(1:30am)  Clear liquids include soda, tea, black coffee, apple or grape juice, broth.  Take these medicines the morning of surgery with A SIP OF WATER:*advair, prozac, prilosec, tramadol, oxybutyin, prednisone   Do not wear jewelry, make-up or nail polish.  Do not wear lotions, powders, or perfumes. You may wear deodorant.  Do not shave 48 hours prior to surgery.  Do not bring valuables to the hospital.  Contacts, dentures or bridgework may not be worn into surgery.  Leave suitcase in the car. After surgery it may be brought to your room.  For patients admitted to the hospital, checkout time is 11:00 AM the day of discharge.   Patients discharged the day of surgery will not be allowed to drive home.  Name and phone number of your driver: Marie [name] [phone]  Special Instructions: CHG Shower Use Special Wash: 1/2 bottle night before surgery and 1/2 bottle morning of surgery.   Please read over the following fact sheets that you were given: Pain Booklet, Coughing and Deep Breathing, MRSA Information and Surgical Site Infection Prevention

## 2011-09-20 MED ORDER — DEXTROSE 5 % IV SOLN
2.0000 g | INTRAVENOUS | Status: DC
  Filled 2011-09-20: qty 2

## 2011-09-21 ENCOUNTER — Encounter (HOSPITAL_COMMUNITY)
Admission: RE | Disposition: A | Discharge status: Home / Self Care | Payer: Self-pay | Source: Ambulatory Visit | Attending: General Surgery

## 2011-09-21 ENCOUNTER — Encounter (HOSPITAL_COMMUNITY): Payer: Self-pay | Admitting: Anesthesiology

## 2011-09-21 ENCOUNTER — Ambulatory Visit (HOSPITAL_COMMUNITY): Payer: Medicare Other | Admitting: Anesthesiology

## 2011-09-21 ENCOUNTER — Ambulatory Visit (HOSPITAL_COMMUNITY)
Admission: RE | Admit: 2011-09-21 | Discharge: 2011-09-21 | Disposition: A | Discharge status: Home / Self Care | Payer: Medicare Other | Source: Ambulatory Visit | Attending: General Surgery | Admitting: General Surgery

## 2011-09-21 ENCOUNTER — Other Ambulatory Visit (INDEPENDENT_AMBULATORY_CARE_PROVIDER_SITE_OTHER): Payer: Self-pay | Admitting: General Surgery

## 2011-09-21 DIAGNOSIS — D129 Benign neoplasm of anus and anal canal: Secondary | ICD-10-CM

## 2011-09-21 DIAGNOSIS — I10 Essential (primary) hypertension: Secondary | ICD-10-CM | POA: Insufficient documentation

## 2011-09-21 DIAGNOSIS — R198 Other specified symptoms and signs involving the digestive system and abdomen: Secondary | ICD-10-CM | POA: Insufficient documentation

## 2011-09-21 DIAGNOSIS — D128 Benign neoplasm of rectum: Secondary | ICD-10-CM

## 2011-09-21 DIAGNOSIS — Z01818 Encounter for other preprocedural examination: Secondary | ICD-10-CM | POA: Insufficient documentation

## 2011-09-21 DIAGNOSIS — Z01812 Encounter for preprocedural laboratory examination: Secondary | ICD-10-CM | POA: Insufficient documentation

## 2011-09-21 DIAGNOSIS — K219 Gastro-esophageal reflux disease without esophagitis: Secondary | ICD-10-CM | POA: Insufficient documentation

## 2011-09-21 DIAGNOSIS — K08409 Partial loss of teeth, unspecified cause, unspecified class: Secondary | ICD-10-CM | POA: Insufficient documentation

## 2011-09-21 DIAGNOSIS — R0602 Shortness of breath: Secondary | ICD-10-CM | POA: Insufficient documentation

## 2011-09-21 HISTORY — PX: RECTAL BIOPSY: SHX2303

## 2011-09-21 SURGERY — BIOPSY, RECTUM
Anesthesia: Monitor Anesthesia Care | Wound class: Clean Contaminated

## 2011-09-21 MED ORDER — SODIUM CHLORIDE 0.9 % IJ SOLN: 3.0000 mL | INTRAMUSCULAR | Status: DC | PRN

## 2011-09-21 MED ORDER — SODIUM CHLORIDE 0.9 % IJ SOLN: 3.0000 mL | Freq: Two times a day (BID) | INTRAMUSCULAR | Status: DC

## 2011-09-21 MED ORDER — PROPOFOL 10 MG/ML IV EMUL
INTRAVENOUS | Status: DC | PRN
  Administered 2011-09-21: 75 ug/kg/min via INTRAVENOUS

## 2011-09-21 MED ORDER — PROMETHAZINE HCL 25 MG/ML IJ SOLN: 12.5000 mg | Freq: Four times a day (QID) | INTRAMUSCULAR | Status: DC | PRN

## 2011-09-21 MED ORDER — ACETAMINOPHEN 650 MG RE SUPP: 650.0000 mg | RECTAL | Status: DC | PRN

## 2011-09-21 MED ORDER — DEXTROSE 5 % IV SOLN
1.0000 g | INTRAVENOUS | Status: DC | PRN
  Administered 2011-09-21: 2 g via INTRAVENOUS

## 2011-09-21 MED ORDER — LACTATED RINGERS IV SOLN
INTRAVENOUS | Status: DC | PRN
  Administered 2011-09-21: 07:00:00 via INTRAVENOUS

## 2011-09-21 MED ORDER — SODIUM CHLORIDE 0.9 % IR SOLN
Status: DC | PRN
  Administered 2011-09-21: 1000 mL

## 2011-09-21 MED ORDER — ACETAMINOPHEN 325 MG PO TABS: 650.0000 mg | ORAL_TABLET | ORAL | Status: DC | PRN

## 2011-09-21 MED ORDER — PROPOFOL 10 MG/ML IV EMUL
INTRAVENOUS | Status: DC | PRN
  Administered 2011-09-21: 20 mg via INTRAVENOUS
  Administered 2011-09-21: 15 mg via INTRAVENOUS

## 2011-09-21 MED ORDER — ONDANSETRON HCL 4 MG/2ML IJ SOLN: 4.0000 mg | Freq: Once | INTRAMUSCULAR | Status: DC | PRN

## 2011-09-21 MED ORDER — MIDAZOLAM HCL 5 MG/5ML IJ SOLN
INTRAMUSCULAR | Status: DC | PRN
  Administered 2011-09-21 (×2): 1 mg via INTRAVENOUS

## 2011-09-21 MED ORDER — ONDANSETRON HCL 4 MG/2ML IJ SOLN: 4.0000 mg | Freq: Four times a day (QID) | INTRAMUSCULAR | Status: DC | PRN

## 2011-09-21 MED ORDER — HYDROMORPHONE HCL PF 1 MG/ML IJ SOLN: 0.2500 mg | INTRAMUSCULAR | Status: DC | PRN

## 2011-09-21 MED ORDER — LIDOCAINE-EPINEPHRINE 0.5-1:200000 % IJ SOLN
INTRAMUSCULAR | Status: DC | PRN
  Administered 2011-09-21: 30 mL

## 2011-09-21 MED ORDER — DIBUCAINE 1 % RE OINT
TOPICAL_OINTMENT | RECTAL | Status: DC | PRN
  Administered 2011-09-21: 1 via RECTAL

## 2011-09-21 MED ORDER — OXYCODONE HCL 5 MG PO TABS: 5.0000 mg | ORAL_TABLET | Freq: Four times a day (QID) | ORAL | Status: AC | PRN

## 2011-09-21 MED ORDER — DIBUCAINE 1 % EX OINT: TOPICAL_OINTMENT | Freq: Three times a day (TID) | CUTANEOUS | Status: AC | PRN

## 2011-09-21 MED ORDER — SODIUM CHLORIDE 0.9 % IV SOLN: 250.0000 mL | INTRAVENOUS | Status: DC | PRN

## 2011-09-21 MED ORDER — OXYCODONE HCL 5 MG PO TABS: 5.0000 mg | ORAL_TABLET | ORAL | Status: DC | PRN

## 2011-09-21 MED ORDER — BUPIVACAINE-EPINEPHRINE 0.25% -1:200000 IJ SOLN
INTRAMUSCULAR | Status: DC | PRN
  Administered 2011-09-21: 30 mL

## 2011-09-21 SURGICAL SUPPLY — 40 items
BLADE SURG 15 STRL LF DISP TIS (BLADE) ×1 IMPLANT
BLADE SURG 15 STRL SS (BLADE) ×2
CANISTER SUCTION 2500CC (MISCELLANEOUS) ×2 IMPLANT
CLOTH BEACON ORANGE TIMEOUT ST (SAFETY) ×2 IMPLANT
CONT SPEC 4OZ CLIKSEAL STRL BL (MISCELLANEOUS) ×1 IMPLANT
COVER SURGICAL LIGHT HANDLE (MISCELLANEOUS) ×2 IMPLANT
DECANTER SPIKE VIAL GLASS SM (MISCELLANEOUS) ×2 IMPLANT
DRAPE PROXIMA HALF (DRAPES) ×2 IMPLANT
DRAPE UTILITY 15X26 W/TAPE STR (DRAPE) ×4 IMPLANT
DRESSING TELFA 8X3 (GAUZE/BANDAGES/DRESSINGS) ×1 IMPLANT
ELECT CAUTERY BLADE 6.4 (BLADE) ×2 IMPLANT
ELECT REM PT RETURN 9FT ADLT (ELECTROSURGICAL) ×2
ELECTRODE REM PT RTRN 9FT ADLT (ELECTROSURGICAL) ×1 IMPLANT
GAUZE SPONGE 4X4 12PLY STRL LF (GAUZE/BANDAGES/DRESSINGS) ×1 IMPLANT
GAUZE SPONGE 4X4 16PLY XRAY LF (GAUZE/BANDAGES/DRESSINGS) ×2 IMPLANT
GLOVE BIO SURGEON STRL SZ7.5 (GLOVE) ×2 IMPLANT
GLOVE NEODERM STER SZ 7 (GLOVE) ×1 IMPLANT
GOWN STRL NON-REIN LRG LVL3 (GOWN DISPOSABLE) ×4 IMPLANT
KIT BASIN OR (CUSTOM PROCEDURE TRAY) ×2 IMPLANT
KIT ROOM TURNOVER OR (KITS) ×2 IMPLANT
NDL HYPO 25GX1X1/2 BEV (NEEDLE) ×1 IMPLANT
NEEDLE HYPO 25GX1X1/2 BEV (NEEDLE) ×2 IMPLANT
NS IRRIG 1000ML POUR BTL (IV SOLUTION) ×2 IMPLANT
PACK LITHOTOMY IV (CUSTOM PROCEDURE TRAY) ×2 IMPLANT
PAD ARMBOARD 7.5X6 YLW CONV (MISCELLANEOUS) ×4 IMPLANT
PENCIL BUTTON HOLSTER BLD 10FT (ELECTRODE) ×2 IMPLANT
SPONGE GAUZE 4X4 12PLY (GAUZE/BANDAGES/DRESSINGS) ×2 IMPLANT
SPONGE SURGIFOAM ABS GEL 12-7 (HEMOSTASIS) ×1 IMPLANT
SURGILUBE 2OZ TUBE FLIPTOP (MISCELLANEOUS) ×2 IMPLANT
SUT CHROMIC 2 0 SH (SUTURE) ×1 IMPLANT
SYR BULB 3OZ (MISCELLANEOUS) ×2 IMPLANT
SYR CONTROL 10ML LL (SYRINGE) ×4 IMPLANT
TAPE CLOTH SURG 4X10 WHT LF (GAUZE/BANDAGES/DRESSINGS) ×1 IMPLANT
TOWEL OR 17X24 6PK STRL BLUE (TOWEL DISPOSABLE) ×2 IMPLANT
TOWEL OR 17X26 10 PK STRL BLUE (TOWEL DISPOSABLE) ×2 IMPLANT
TRAY PROCTOSCOPIC FIBER OPTIC (SET/KITS/TRAYS/PACK) IMPLANT
TUBE CONNECTING 12X1/4 (SUCTIONS) ×2 IMPLANT
UNDERPAD 30X30 INCONTINENT (UNDERPADS AND DIAPERS) ×2 IMPLANT
WATER STERILE IRR 1000ML POUR (IV SOLUTION) IMPLANT
YANKAUER SUCT BULB TIP NO VENT (SUCTIONS) ×2 IMPLANT

## 2011-09-21 NOTE — Preoperative (Signed)
Beta Blockers   Reason not to administer Beta Blockers:Not Applicable 

## 2011-09-21 NOTE — Transfer of Care (Signed)
Immediate Anesthesia Transfer of Care Note  Patient: Marie [name]  Procedure(s) Performed:  BIOPSY RECTAL - 3-4 cm  Patient Location: PACU  Anesthesia Type: MAC  Level of Consciousness: awake, alert  and oriented  Airway & Oxygen Therapy: Patient Spontanous Breathing  Post-op Assessment: Report given to PACU RN and Post -op Vital signs reviewed and stable  Post vital signs: Reviewed and stable  Complications: No apparent anesthesia complications

## 2011-09-21 NOTE — H&P (View-Only) (Signed)
Subjective:     Patient ID: Marie [name], female   DOB: 12/13/1935, 75 y.o.   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 Since her last visit she has been doing fairly well. She notes that on occasion she will have a little leakage after her bowel movement. This occurs about once a month. She denies any rectal pain. She denies any bleeding in her stools.  Review of Systems  Constitutional: Negative.   HENT: Negative.   Eyes: Negative.   Respiratory: Negative.   Cardiovascular: Negative.   Gastrointestinal: Negative.   Genitourinary: Negative.   Musculoskeletal: Negative.   Skin: Negative.   Neurological: Negative.   Hematological: Bruises/bleeds easily.  Psychiatric/Behavioral: Negative.    Past Medical History  Diagnosis Date  . Hypertension   . Incontinence   . Arthritis   . Wears dentures   . Osteoporosis   . Bulging disc   . Hyperlipidemia    Past Surgical History  Procedure Date  . Incontinence surgery   . Rectal surgery     by dr. toth  . Appendectomy   . Abdominal hysterectomy    Current Outpatient Prescriptions  Medication Sig Dispense Refill  . amitriptyline (ELAVIL) 10 MG tablet daily.      . aspirin 81 MG tablet Take 81 mg by mouth daily.        . Cholecalciferol (VITAMIN D-3) 5000 UNITS TABS Take by mouth daily.        . Cyanocobalamin (VITAMIN B-12 PO) Take by mouth daily.        . FLUoxetine (PROZAC) 40 MG capsule Take 40 mg by mouth daily.        . Fluticasone-Salmeterol (ADVAIR DISKUS) 250-50 MCG/DOSE AEPB Inhale 1 puff into the lungs every 12 (twelve) hours.        . losartan-hydrochlorothiazide (HYZAAR) 100-25 MG per tablet Take 1 tablet by mouth daily.        . Multiple Vitamin (MULTIVITAMIN) capsule Take 1 capsule by mouth daily.        . omeprazole (PRILOSEC) 20 MG capsule BID times 48H.      . oxybutynin (DITROPAN) 5 MG  tablet daily.      . pravastatin (PRAVACHOL) 40 MG tablet daily.      . predniSONE (DELTASONE) 10 MG tablet Take 10 mg by mouth as directed.        . predniSONE (DELTASONE) 5 MG tablet Take 5 mg by mouth as directed.        . TRAMADOL HCL PO Take by mouth as needed.         Allergies  Allergen Reactions  . Codeine   . Demerol   . Morphine And Related   . Sulfate        Objective:   Physical Exam  Constitutional: She is oriented to person, place, and time. She appears well-developed and well-nourished.  HENT:  Head: Normocephalic and atraumatic.  Eyes: Conjunctivae and EOM are normal. Pupils are equal, round, and reactive to light.  Neck: Normal range of motion. Neck supple.  Cardiovascular: Normal rate, regular rhythm and normal heart sounds.   Pulmonary/Chest: Effort normal and breath sounds normal.  Abdominal: Soft. Bowel sounds are normal.  Genitourinary:       Her external perirectal skin looks good. She may have a small superficial tear posteriorly. This is nontender. She has poor rectal   tone. She has a lot of stool in the rectal vault. On anoscopic exam posteriorly she does have a small 3-4 mm pedunculated mass. This is posterior.  Musculoskeletal: Normal range of motion.  Neurological: She is alert and oriented to person, place, and time.  Skin: Skin is warm and dry.  Psychiatric: She has a normal mood and affect. Her behavior is normal.       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 She now has a small mass in that same area.    Plan:     Because of her history I think this mass needs to be biopsied. She does bruise very easily and I'm afraid of rebiopsy this in the clinic that we may get into some bleeding that we can't control. I have therefore recommended that we do this biopsy in the operating room where we can control any bleeding we encounter. I discussed her in detail the risks and benefits that perspective this  as well some of the technical aspects and she understands and wishes to proceed. For her poor rectal tone we may need to refer her to a colorectal specialist      

## 2011-09-21 NOTE — Anesthesia Postprocedure Evaluation (Signed)
  Anesthesia Post-op Note  Patient: Marie [name]  Procedure(s) Performed:  BIOPSY RECTAL - 3-4 cm  Patient Location: PACU  Anesthesia Type: MAC  Level of Consciousness: awake, alert , oriented and patient cooperative  Airway and Oxygen Therapy: Patient Spontanous Breathing and Patient connected to nasal cannula oxygen  Post-op Pain: none  Post-op Assessment: Post-op Vital signs reviewed, Patient's Cardiovascular Status Stable, Respiratory Function Stable, Patent Airway, No signs of Nausea or vomiting and Pain level controlled  Post-op Vital Signs: stable  Complications: No apparent anesthesia complications

## 2011-09-21 NOTE — Op Note (Signed)
09/21/2011  8:13 AM  PATIENT:  Marie [name]  75 y.o. female  PRE-OPERATIVE DIAGNOSIS:  Rectal Mass  POST-OPERATIVE DIAGNOSIS:  Rectal Mass  PROCEDURE:  Procedure(s): BIOPSY RECTAL  SURGEON:  Surgeon(s): Caleen Essex III, MD  PHYSICIAN ASSISTANT:   ASSISTANTS: none   ANESTHESIA:   local and IV sedation  EBL:  Total I/O In: 500 [I.V.:500] Out: 15 [Blood:15]  BLOOD ADMINISTERED:none  DRAINS: none   LOCAL MEDICATIONS USED:  MARCAINE 15CC and LIDOCAINE 10CC  SPECIMEN:  Excision  DISPOSITION OF SPECIMEN:  PATHOLOGY  COUNTS:  YES  TOURNIQUET:  * No tourniquets in log *  DICTATION: .Dragon Dictation  PLAN OF CARE: Discharge to home after PACU  PATIENT DISPOSITION:  PACU - hemodynamically stable.   Procedure: After informed consent was obtained the patient was brought to the operating room and placed in the supine position on the operating room table. After IV sedation had been given the patient was placed in lithotomy position. Her perirectal region was prepped with Betadine and draped in usual sterile manner. Her perirectal area was then infiltrated with half percent lidocaine with epinephrine. Her perirectal area was then infiltrated with quarter percent Marcaine with epinephrine. A bullet-type retractor was placed in the rectum and the rectum was examined circumferentially. Posteriorly the patient had 3 small masses clustered together right at her dentate line. This area was infiltrated with quarter percent Marcaine and the masses were excised sharply with tenotomy scissors. Hemostasis was achieved using the Bovie electrocautery. The specimens were sent to pathology for further evaluation. No other abnormalities were noted. A small piece of Gelfoam and the betaine ointment was used to coat the inside and outside of the rectum. Sterile dressings were applied. The patient tolerated the procedure well. At the end of the case all needle sponge and instrument counts were  correct. The patient was then taken to recovery in stable condition.

## 2011-09-21 NOTE — Anesthesia Preprocedure Evaluation (Addendum)
Anesthesia Evaluation  Patient identified by MRN, date of birth, ID band Patient awake    Reviewed: Allergy & Precautions, H&P , NPO status , Patient's Chart, lab work & pertinent test results  History of Anesthesia Complications (+) AWARENESS UNDER ANESTHESIA  Airway Mallampati: I TM Distance: >3 FB Neck ROM: full    Dental  (+) Edentulous Upper and Partial Upper   Pulmonary shortness of breath,  + rhonchi        Cardiovascular hypertension, regular Normal    Neuro/Psych PSYCHIATRIC DISORDERS  Neuromuscular disease    GI/Hepatic GERD-  ,  Endo/Other  Negative Endocrine ROS  Renal/GU negative Renal ROS  Genitourinary negative   Musculoskeletal negative musculoskeletal ROS (+)   Abdominal   Peds  Hematology negative hematology ROS (+)   Anesthesia Other Findings   Reproductive/Obstetrics negative OB ROS                         Anesthesia Physical Anesthesia Plan  ASA: III  Anesthesia Plan: MAC   Post-op Pain Management:    Induction: Intravenous  Airway Management Planned: Mask  Additional Equipment:   Intra-op Plan:   Post-operative Plan:   Informed Consent: I have reviewed the patients History and Physical, chart, labs and discussed the procedure including the risks, benefits and alternatives for the proposed anesthesia with the patient or authorized representative who has indicated his/her understanding and acceptance.   Dental Advisory Given  Plan Discussed with: CRNA, Anesthesiologist and Surgeon  Anesthesia Plan Comments:       Anesthesia Quick Evaluation

## 2011-09-21 NOTE — Interval H&P Note (Signed)
History and Physical Interval Note:  09/21/2011 7:18 AM  Marie [name]  has presented today for surgery, with the diagnosis of Rectal Mass  The various methods of treatment have been discussed with the patient and family. After consideration of risks, benefits and other options for treatment, the patient has consented to  Procedure(s): BIOPSY RECTAL as a surgical intervention .  The patients' history has been reviewed, patient examined, no change in status, stable for surgery.  I have reviewed the patients' chart and labs.  Questions were answered to the patient's satisfaction.     TOTH III,Alexas Basulto S

## 2011-09-25 ENCOUNTER — Telehealth (INDEPENDENT_AMBULATORY_CARE_PROVIDER_SITE_OTHER): Payer: Self-pay | Admitting: General Surgery

## 2011-09-25 NOTE — Telephone Encounter (Signed)
Path report given 

## 2011-09-29 ENCOUNTER — Encounter (HOSPITAL_COMMUNITY): Payer: Self-pay | Admitting: General Surgery

## 2011-10-08 ENCOUNTER — Telehealth (INDEPENDENT_AMBULATORY_CARE_PROVIDER_SITE_OTHER): Payer: Self-pay

## 2011-10-08 NOTE — Telephone Encounter (Signed)
Pt called to report some bleeding after a BM.  She is having normal bowel movements and is not constipated.  I explained that some bleeding is normal after rectal surgery, but to call our office if the bleeding increased, or if she was having increased pain.  She understood and agreed.

## 2011-10-23 ENCOUNTER — Encounter (INDEPENDENT_AMBULATORY_CARE_PROVIDER_SITE_OTHER): Payer: Self-pay | Admitting: General Surgery

## 2011-10-23 ENCOUNTER — Ambulatory Visit (INDEPENDENT_AMBULATORY_CARE_PROVIDER_SITE_OTHER): Payer: Medicare Other | Admitting: General Surgery

## 2011-10-23 VITALS — BP 116/68 | HR 66 | Temp 97.2°F | Resp 16 | Ht 64.0 in | Wt 174.0 lb

## 2011-10-23 DIAGNOSIS — D128 Benign neoplasm of rectum: Secondary | ICD-10-CM

## 2011-10-26 DIAGNOSIS — IMO0002 Reserved for concepts with insufficient information to code with codable children: Secondary | ICD-10-CM | POA: Diagnosis not present

## 2011-10-27 ENCOUNTER — Encounter (INDEPENDENT_AMBULATORY_CARE_PROVIDER_SITE_OTHER): Payer: Self-pay | Admitting: General Surgery

## 2011-10-27 NOTE — Progress Notes (Signed)
Subjective:     Patient ID: Marie [name], female   DOB: July 31, 1936, 76 y.o.   MRN: [id number]  HPI The patient is a 76 year old white female who is now about a month out from excision of a mass from her rectum. The path showed this to be a tubulovillous adenoma with no dysplasia. She has done well since the surgery and has no complaints today. Her appetite is good and her bowels are working normally.  Review of Systems     Objective:   Physical Exam On exam her perirectal skin looks good. The operative site appears to be healing well.    Assessment:     Tubulovillous adenoma of the rectum completely removed.    Plan:     At this point I would like her to continue to avoid periods of constipation. She otherwise appears to be healing well. We will plan to see her back in another month at which time we'll repeat an anoscopy

## 2011-11-09 DIAGNOSIS — M545 Low back pain: Secondary | ICD-10-CM | POA: Diagnosis not present

## 2011-11-09 DIAGNOSIS — M47817 Spondylosis without myelopathy or radiculopathy, lumbosacral region: Secondary | ICD-10-CM | POA: Diagnosis not present

## 2011-11-11 DIAGNOSIS — E559 Vitamin D deficiency, unspecified: Secondary | ICD-10-CM | POA: Diagnosis not present

## 2011-11-11 DIAGNOSIS — E782 Mixed hyperlipidemia: Secondary | ICD-10-CM | POA: Diagnosis not present

## 2011-11-11 DIAGNOSIS — R7309 Other abnormal glucose: Secondary | ICD-10-CM | POA: Diagnosis not present

## 2011-11-11 DIAGNOSIS — I10 Essential (primary) hypertension: Secondary | ICD-10-CM | POA: Diagnosis not present

## 2011-11-11 DIAGNOSIS — Z79899 Other long term (current) drug therapy: Secondary | ICD-10-CM | POA: Diagnosis not present

## 2011-11-24 ENCOUNTER — Encounter (INDEPENDENT_AMBULATORY_CARE_PROVIDER_SITE_OTHER): Payer: Medicare Other | Admitting: General Surgery

## 2011-12-09 DIAGNOSIS — N3942 Incontinence without sensory awareness: Secondary | ICD-10-CM | POA: Diagnosis not present

## 2011-12-09 DIAGNOSIS — N390 Urinary tract infection, site not specified: Secondary | ICD-10-CM | POA: Diagnosis not present

## 2011-12-21 ENCOUNTER — Encounter (INDEPENDENT_AMBULATORY_CARE_PROVIDER_SITE_OTHER): Payer: Self-pay | Admitting: General Surgery

## 2011-12-22 ENCOUNTER — Encounter (INDEPENDENT_AMBULATORY_CARE_PROVIDER_SITE_OTHER): Payer: Self-pay | Admitting: General Surgery

## 2011-12-22 ENCOUNTER — Ambulatory Visit (INDEPENDENT_AMBULATORY_CARE_PROVIDER_SITE_OTHER): Payer: Medicare Other | Admitting: General Surgery

## 2011-12-22 VITALS — BP 118/66 | HR 70 | Temp 97.8°F | Resp 18 | Ht 64.0 in | Wt 176.2 lb

## 2011-12-22 DIAGNOSIS — D129 Benign neoplasm of anus and anal canal: Secondary | ICD-10-CM | POA: Diagnosis not present

## 2011-12-22 DIAGNOSIS — D128 Benign neoplasm of rectum: Secondary | ICD-10-CM

## 2011-12-29 ENCOUNTER — Encounter (INDEPENDENT_AMBULATORY_CARE_PROVIDER_SITE_OTHER): Payer: Self-pay | Admitting: General Surgery

## 2011-12-29 NOTE — Progress Notes (Signed)
Subjective:     Patient ID: Bolivar Haw, female   DOB: 13-Aug-1936, 76 y.o.   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 She has done well since surgery. She has no complaints today. She does note some occasional fecal incontinence but this is a chronic problem for her.  Review of Systems     Objective:   Physical Exam On exam her perirectal skin looks good. On digital exam I cannot feel any masses. On anoscopic exam I do not see any irregularities.    Assessment:     Status post excision of tubulovillous adenoma from the anal verge    Plan:     At this point she is doing well. We will plan to see her back in about 3 months. I have offered to have her see a colorectal specialist for her fecal incontinence and she has declined.

## 2012-02-01 DIAGNOSIS — M25569 Pain in unspecified knee: Secondary | ICD-10-CM | POA: Diagnosis not present

## 2012-02-01 DIAGNOSIS — S8000XA Contusion of unspecified knee, initial encounter: Secondary | ICD-10-CM | POA: Diagnosis not present

## 2012-02-11 DIAGNOSIS — M1710 Unilateral primary osteoarthritis, unspecified knee: Secondary | ICD-10-CM | POA: Diagnosis not present

## 2012-02-12 ENCOUNTER — Other Ambulatory Visit (HOSPITAL_COMMUNITY): Payer: Self-pay | Admitting: Internal Medicine

## 2012-02-12 ENCOUNTER — Ambulatory Visit (HOSPITAL_COMMUNITY)
Admission: RE | Admit: 2012-02-12 | Discharge: 2012-02-12 | Disposition: A | Discharge status: Home / Self Care | Payer: Medicare Other | Source: Ambulatory Visit | Attending: Internal Medicine | Admitting: Internal Medicine

## 2012-02-12 DIAGNOSIS — Z1212 Encounter for screening for malignant neoplasm of rectum: Secondary | ICD-10-CM | POA: Diagnosis not present

## 2012-02-12 DIAGNOSIS — I10 Essential (primary) hypertension: Secondary | ICD-10-CM | POA: Diagnosis not present

## 2012-02-12 DIAGNOSIS — E782 Mixed hyperlipidemia: Secondary | ICD-10-CM | POA: Diagnosis not present

## 2012-02-12 DIAGNOSIS — R05 Cough: Secondary | ICD-10-CM | POA: Insufficient documentation

## 2012-02-12 DIAGNOSIS — Z111 Encounter for screening for respiratory tuberculosis: Secondary | ICD-10-CM | POA: Diagnosis not present

## 2012-02-12 DIAGNOSIS — R7309 Other abnormal glucose: Secondary | ICD-10-CM | POA: Diagnosis not present

## 2012-02-12 DIAGNOSIS — Z1231 Encounter for screening mammogram for malignant neoplasm of breast: Secondary | ICD-10-CM

## 2012-02-12 DIAGNOSIS — R059 Cough, unspecified: Secondary | ICD-10-CM | POA: Diagnosis not present

## 2012-02-12 DIAGNOSIS — E559 Vitamin D deficiency, unspecified: Secondary | ICD-10-CM | POA: Diagnosis not present

## 2012-02-12 DIAGNOSIS — M8580 Other specified disorders of bone density and structure, unspecified site: Secondary | ICD-10-CM

## 2012-02-12 DIAGNOSIS — Z79899 Other long term (current) drug therapy: Secondary | ICD-10-CM | POA: Diagnosis not present

## 2012-02-16 DIAGNOSIS — N3000 Acute cystitis without hematuria: Secondary | ICD-10-CM | POA: Diagnosis not present

## 2012-03-01 DIAGNOSIS — B372 Candidiasis of skin and nail: Secondary | ICD-10-CM | POA: Diagnosis not present

## 2012-03-15 ENCOUNTER — Encounter (INDEPENDENT_AMBULATORY_CARE_PROVIDER_SITE_OTHER): Payer: Self-pay | Admitting: General Surgery

## 2012-03-15 ENCOUNTER — Ambulatory Visit (INDEPENDENT_AMBULATORY_CARE_PROVIDER_SITE_OTHER): Payer: Medicare Other | Admitting: General Surgery

## 2012-03-15 VITALS — BP 120/64 | HR 86 | Temp 97.7°F | Resp 16 | Ht 64.0 in | Wt 175.4 lb

## 2012-03-15 DIAGNOSIS — D129 Benign neoplasm of anus and anal canal: Secondary | ICD-10-CM | POA: Diagnosis not present

## 2012-03-15 DIAGNOSIS — D128 Benign neoplasm of rectum: Secondary | ICD-10-CM | POA: Diagnosis not present

## 2012-03-15 NOTE — Progress Notes (Signed)
Subjective:     Patient ID: Marie [name], female   DOB: Nov 05, 1935, 76 y.o.   MRN: [id number]  HPI The patient is a 76 year old white female who is about 6 months out from a second resection of a tubulovillous adenoma from the anal verge. She denies any rectal pain. She will occasionally get a small bit of leakage of stool which is stable for her. She denies any blood in her stool.  Review of Systems  Constitutional: Negative.   HENT: Negative.   Eyes: Negative.   Respiratory: Negative.   Cardiovascular: Negative.   Gastrointestinal: Negative for blood in stool, anal bleeding and rectal pain.  Genitourinary: Negative.   Musculoskeletal: Negative.   Skin: Negative.   Neurological: Negative.   Hematological: Negative.   Psychiatric/Behavioral: Negative.        Objective:   Physical Exam  Constitutional: She is oriented to person, place, and time. She appears well-developed and well-nourished.  HENT:  Head: Normocephalic and atraumatic.  Eyes: Conjunctivae and EOM are normal. Pupils are equal, round, and reactive to light.  Neck: Normal range of motion. Neck supple.  Cardiovascular: Normal rate, regular rhythm and normal heart sounds.   Pulmonary/Chest: Effort normal and breath sounds normal.  Abdominal: Soft. Bowel sounds are normal.  Genitourinary:       She has poor rectal tone. No palpable mass. On anoscopic exam I did not see any evidence of recurrence of the adenoma.  Musculoskeletal: Normal range of motion.  Neurological: She is alert and oriented to person, place, and time.  Skin: Skin is warm and dry.  Psychiatric: She has a normal mood and affect. Her behavior is normal.       Assessment:     6 months status post removal of tubulovillous adenoma at the anal verge    Plan:     At this point we will see her back in about 6 months for another recheck

## 2012-04-29 ENCOUNTER — Ambulatory Visit (HOSPITAL_COMMUNITY)
Admission: RE | Admit: 2012-04-29 | Discharge: 2012-04-29 | Disposition: A | Discharge status: Home / Self Care | Payer: Medicare Other | Source: Ambulatory Visit | Attending: Internal Medicine | Admitting: Internal Medicine

## 2012-04-29 DIAGNOSIS — M949 Disorder of cartilage, unspecified: Secondary | ICD-10-CM | POA: Diagnosis not present

## 2012-04-29 DIAGNOSIS — M899 Disorder of bone, unspecified: Secondary | ICD-10-CM | POA: Diagnosis not present

## 2012-04-29 DIAGNOSIS — Z1231 Encounter for screening mammogram for malignant neoplasm of breast: Secondary | ICD-10-CM | POA: Diagnosis not present

## 2012-04-29 DIAGNOSIS — M8580 Other specified disorders of bone density and structure, unspecified site: Secondary | ICD-10-CM

## 2012-05-17 DIAGNOSIS — E782 Mixed hyperlipidemia: Secondary | ICD-10-CM | POA: Diagnosis not present

## 2012-05-17 DIAGNOSIS — E559 Vitamin D deficiency, unspecified: Secondary | ICD-10-CM | POA: Diagnosis not present

## 2012-05-17 DIAGNOSIS — Z79899 Other long term (current) drug therapy: Secondary | ICD-10-CM | POA: Diagnosis not present

## 2012-05-17 DIAGNOSIS — I10 Essential (primary) hypertension: Secondary | ICD-10-CM | POA: Diagnosis not present

## 2012-05-17 DIAGNOSIS — R7309 Other abnormal glucose: Secondary | ICD-10-CM | POA: Diagnosis not present

## 2012-06-27 DIAGNOSIS — G608 Other hereditary and idiopathic neuropathies: Secondary | ICD-10-CM | POA: Diagnosis not present

## 2012-06-27 DIAGNOSIS — N3000 Acute cystitis without hematuria: Secondary | ICD-10-CM | POA: Diagnosis not present

## 2012-06-27 DIAGNOSIS — M79609 Pain in unspecified limb: Secondary | ICD-10-CM | POA: Diagnosis not present

## 2012-06-27 DIAGNOSIS — L609 Nail disorder, unspecified: Secondary | ICD-10-CM | POA: Diagnosis not present

## 2012-07-21 DIAGNOSIS — Z23 Encounter for immunization: Secondary | ICD-10-CM | POA: Diagnosis not present

## 2012-07-27 DIAGNOSIS — N312 Flaccid neuropathic bladder, not elsewhere classified: Secondary | ICD-10-CM | POA: Diagnosis not present

## 2012-07-27 DIAGNOSIS — R109 Unspecified abdominal pain: Secondary | ICD-10-CM | POA: Diagnosis not present

## 2012-08-09 DIAGNOSIS — N390 Urinary tract infection, site not specified: Secondary | ICD-10-CM | POA: Diagnosis not present

## 2012-08-09 DIAGNOSIS — N312 Flaccid neuropathic bladder, not elsewhere classified: Secondary | ICD-10-CM | POA: Diagnosis not present

## 2012-08-17 DIAGNOSIS — R7309 Other abnormal glucose: Secondary | ICD-10-CM | POA: Diagnosis not present

## 2012-08-17 DIAGNOSIS — E782 Mixed hyperlipidemia: Secondary | ICD-10-CM | POA: Diagnosis not present

## 2012-08-17 DIAGNOSIS — E559 Vitamin D deficiency, unspecified: Secondary | ICD-10-CM | POA: Diagnosis not present

## 2012-08-17 DIAGNOSIS — I10 Essential (primary) hypertension: Secondary | ICD-10-CM | POA: Diagnosis not present

## 2012-08-23 DIAGNOSIS — M2040 Other hammer toe(s) (acquired), unspecified foot: Secondary | ICD-10-CM | POA: Diagnosis not present

## 2012-08-23 DIAGNOSIS — M79609 Pain in unspecified limb: Secondary | ICD-10-CM | POA: Diagnosis not present

## 2012-09-05 DIAGNOSIS — J209 Acute bronchitis, unspecified: Secondary | ICD-10-CM | POA: Diagnosis not present

## 2012-09-07 ENCOUNTER — Telehealth (INDEPENDENT_AMBULATORY_CARE_PROVIDER_SITE_OTHER): Payer: Self-pay | Admitting: General Surgery

## 2012-09-07 NOTE — Telephone Encounter (Signed)
LMOM letting pt know that I had to reschedule her appt from 11/26 to 12/5 at 4:40.  I also sent a reminder card in the mail

## 2012-09-13 ENCOUNTER — Encounter (INDEPENDENT_AMBULATORY_CARE_PROVIDER_SITE_OTHER): Payer: Medicare Other | Admitting: General Surgery

## 2012-09-22 ENCOUNTER — Ambulatory Visit (INDEPENDENT_AMBULATORY_CARE_PROVIDER_SITE_OTHER): Payer: Medicare Other | Admitting: General Surgery

## 2012-09-22 ENCOUNTER — Encounter (INDEPENDENT_AMBULATORY_CARE_PROVIDER_SITE_OTHER): Payer: Self-pay | Admitting: General Surgery

## 2012-09-22 VITALS — BP 102/60 | HR 102 | Temp 97.6°F | Resp 18 | Ht 64.0 in | Wt 184.4 lb

## 2012-09-22 DIAGNOSIS — D128 Benign neoplasm of rectum: Secondary | ICD-10-CM | POA: Diagnosis not present

## 2012-09-22 NOTE — Patient Instructions (Signed)
Plan to get 2nd opinion from Dr. Maisie Fus

## 2012-09-23 ENCOUNTER — Telehealth (INDEPENDENT_AMBULATORY_CARE_PROVIDER_SITE_OTHER): Payer: Self-pay

## 2012-09-23 NOTE — Telephone Encounter (Signed)
The pt called back and was given her appt for 12/24.

## 2012-09-23 NOTE — Telephone Encounter (Signed)
I called and left a message for the pt to call me so I can give her an appt with Dr Maisie Fus.

## 2012-09-26 ENCOUNTER — Encounter (INDEPENDENT_AMBULATORY_CARE_PROVIDER_SITE_OTHER): Payer: Self-pay | Admitting: General Surgery

## 2012-09-26 NOTE — Progress Notes (Signed)
Subjective:     Patient ID: Marie [name], female   DOB: 06-05-1936, 76 y.o.   MRN: [id number]  HPI The patient is a 76 year old white female who is about one year status post resection for a second time of a tubulovillous adenoma at the anal verge. Since her last visit she has been well. She has no complaints. She denies any rectal pain. She denies any rectal bleeding. Her appetite is good and her bowels are working normally.  Review of Systems  Constitutional: Negative.   HENT: Negative.   Eyes: Negative.   Respiratory: Negative.   Cardiovascular: Negative.   Gastrointestinal: Negative.   Genitourinary: Negative.   Musculoskeletal: Negative.   Skin: Negative.   Neurological: Negative.   Hematological: Negative.   Psychiatric/Behavioral: Negative.        Objective:   Physical Exam  Constitutional: She is oriented to person, place, and time. She appears well-developed and well-nourished.  HENT:  Head: Normocephalic and atraumatic.  Eyes: Conjunctivae normal and EOM are normal. Pupils are equal, round, and reactive to light.  Neck: Normal range of motion. Neck supple.  Cardiovascular: Normal rate, regular rhythm and normal heart sounds.   Pulmonary/Chest: Effort normal and breath sounds normal.  Abdominal: Soft. Bowel sounds are normal. She exhibits no mass. There is no tenderness.  Genitourinary:       Her perirectal skin looks good. On digital exam I cannot feel any masses. She has decreased rectal tone. On anoscopic exam it is possible there may be anterior small abnormality posteriorly. It is very difficult to distinguish this from a fold of the mucosa.  Musculoskeletal: Normal range of motion.  Neurological: She is alert and oriented to person, place, and time.  Skin: Skin is warm and dry.  Psychiatric: She has a normal mood and affect. Her behavior is normal.       Assessment:     1 year status post excision of tubulovillous adenoma from the anal verge    Plan:      At this point I would like to refer her to Dr. Maisie Fus our colorectal specialist to get a second opinion on what we're seeing that her anal verge. Otherwise will plan to see her back in about 2-3 months to see if there's been any change.

## 2012-10-03 DIAGNOSIS — Z85828 Personal history of other malignant neoplasm of skin: Secondary | ICD-10-CM | POA: Diagnosis not present

## 2012-10-03 DIAGNOSIS — L821 Other seborrheic keratosis: Secondary | ICD-10-CM | POA: Diagnosis not present

## 2012-10-03 DIAGNOSIS — D239 Other benign neoplasm of skin, unspecified: Secondary | ICD-10-CM | POA: Diagnosis not present

## 2012-10-03 DIAGNOSIS — D692 Other nonthrombocytopenic purpura: Secondary | ICD-10-CM | POA: Diagnosis not present

## 2012-10-11 ENCOUNTER — Encounter (INDEPENDENT_AMBULATORY_CARE_PROVIDER_SITE_OTHER): Payer: Self-pay | Admitting: General Surgery

## 2012-10-11 ENCOUNTER — Ambulatory Visit (INDEPENDENT_AMBULATORY_CARE_PROVIDER_SITE_OTHER): Payer: Medicare Other | Admitting: General Surgery

## 2012-10-11 VITALS — BP 120/70 | HR 84 | Temp 97.0°F | Resp 12 | Ht 64.0 in | Wt 183.5 lb

## 2012-10-11 DIAGNOSIS — K621 Rectal polyp: Secondary | ICD-10-CM

## 2012-10-11 DIAGNOSIS — K620 Anal polyp: Secondary | ICD-10-CM | POA: Diagnosis not present

## 2012-10-11 NOTE — Progress Notes (Signed)
Chief Complaint  Patient presents with  . Colon Polyps    HISTORY:  Marie [name] is a 76 y.o. female who presents to clinic for a second opinion regarding an anal exam.  She is a 76 year old female who is about one year status post resection for a second time of a tubulovillous adenoma at the anal verge.  Per Dr Billey Chang note there was a small abnormality posteriorly on anoscopy.  I have been asked to evaluate this.  She denies any fecal leakage.  She denies any fevers or pain.     Past Medical History  Diagnosis Date  . Incontinence   . Arthritis   . Wears dentures   . Osteoporosis   . Bulging disc   . Hyperlipidemia   . Shortness of breath     due to congestion at times, on prednisone and advair  . GERD (gastroesophageal reflux disease)   . Neuropathy   . Depression   . Hypertension     followed by intern med Dr. Kendrick Fries [phone]  . Tubulovillous adenoma of rectum        Past Surgical History  Procedure Date  . Incontinence surgery   . Rectal surgery     by dr. Carolynne Edouard  . Appendectomy   . Abdominal hysterectomy   . Tonsillectomy   . Eye surgery     bilateral cataract surgery and lens implant  . Dilation and curettage of uterus   . Rectal biopsy 09/21/2011    Procedure: BIOPSY RECTAL;  Surgeon: Robyne Askew, MD;  Location: [facility] OR;  Service: General;  Laterality: N/A;  3-4 cm      Current Outpatient Prescriptions  Medication Sig Dispense Refill  . amitriptyline (ELAVIL) 10 MG tablet Take 10 mg by mouth daily.       Marland Kitchen aspirin 81 MG tablet Take 81 mg by mouth daily.       . Cholecalciferol (VITAMIN D-3) 5000 UNITS TABS Take 1 tablet by mouth daily.       . Cyanocobalamin (VITAMIN B-12 PO) Take 1 tablet by mouth daily.       Marland Kitchen FLUoxetine (PROZAC) 40 MG capsule Take 40 mg by mouth daily.       . Fluticasone-Salmeterol (ADVAIR DISKUS) 250-50 MCG/DOSE AEPB Inhale 1 puff into the lungs every 12 (twelve) hours.       Marland Kitchen losartan-hydrochlorothiazide (HYZAAR) 100-25 MG per  tablet Take 1 tablet by mouth daily.       . Multiple Vitamin (MULTIVITAMIN) capsule Take 1 capsule by mouth daily.       Marland Kitchen omeprazole (PRILOSEC) 20 MG capsule Take 20 mg by mouth 2 (two) times daily.       Marland Kitchen oxybutynin (DITROPAN) 5 MG tablet Take 5 mg by mouth daily.       . pravastatin (PRAVACHOL) 40 MG tablet Take 40 mg by mouth daily.       . predniSONE (DELTASONE) 5 MG tablet Take 5 mg by mouth as directed.       Marland Kitchen TRAMADOL HCL PO Take 50 mg by mouth 3 (three) times daily as needed. For pain         Allergies  Allergen Reactions  . Codeine Other (See Comments)    hallucinations  . Demerol Other (See Comments)    hallucinations  . Morphine And Related Other (See Comments)    unknown  . Sulfate Other (See Comments)    unknown      Family History  Problem Relation Age  of Onset  . Cancer Sister     pt unaware of what kind      History   Social History  . Marital Status: Married    Spouse Name: N/A    Number of Children: N/A  . Years of Education: N/A   Social History Main Topics  . Smoking status: Never Smoker   . Smokeless tobacco: Never Used  . Alcohol Use: No  . Drug Use: No  . Sexually Active: None   Other Topics Concern  . None   Social History Narrative  . None       REVIEW OF SYSTEMS - PERTINENT POSITIVES ONLY: Review of Systems - Negative except in HPI  EXAM: Filed Vitals:   10/11/12 1128  BP: 120/70  Pulse: 84  Temp: 97 F (36.1 C)  Resp: 12    General appearance: alert and cooperative GI: soft, non-tender; bowel sounds normal; no masses,  no organomegaly  Procedure: Anoscopy Surgeon: Maisie Fus Assistant: Christella Scheuermann After the risks and benefits were explained, verbal consent was obtained for above procedure  Anesthesia: none Diagnosis:  H/O rectal polyp Findings: scar noted at dentate line, mucosa appears normal     ASSESSMENT AND PLAN: I believe I see the area that Dr Carolynne Edouard was questioning.  It appears to be mucosa that abruptly  transitions to anal canal and scar from previous resection.  Agree with plan.  Re-evaluate in 3 month.     [name], MD Colon and Rectal Surgery / General Surgery [facility]      Visit Diagnoses: No diagnosis found.  Primary Care Physician: Nadean Corwin, MD

## 2012-10-11 NOTE — Patient Instructions (Addendum)
Continue follow up with DrToth as scheduled

## 2012-10-25 ENCOUNTER — Inpatient Hospital Stay (HOSPITAL_COMMUNITY): Payer: Medicare Other | Admitting: Anesthesiology

## 2012-10-25 ENCOUNTER — Inpatient Hospital Stay (HOSPITAL_COMMUNITY): Payer: Medicare Other

## 2012-10-25 ENCOUNTER — Inpatient Hospital Stay (HOSPITAL_COMMUNITY)
Admission: EM | Admit: 2012-10-25 | Discharge: 2012-10-28 | DRG: 493 | Disposition: A | Discharge status: Skilled Nursing Facility | Payer: Medicare Other | Attending: Emergency Medicine | Admitting: Emergency Medicine

## 2012-10-25 ENCOUNTER — Emergency Department (HOSPITAL_COMMUNITY): Payer: Medicare Other

## 2012-10-25 ENCOUNTER — Encounter (HOSPITAL_COMMUNITY): Payer: Self-pay | Admitting: Anesthesiology

## 2012-10-25 ENCOUNTER — Encounter (HOSPITAL_COMMUNITY)
Admission: EM | Disposition: A | Discharge status: Skilled Nursing Facility | Payer: Self-pay | Source: Home / Self Care | Attending: Internal Medicine

## 2012-10-25 ENCOUNTER — Encounter (HOSPITAL_COMMUNITY): Payer: Self-pay | Admitting: Emergency Medicine

## 2012-10-25 DIAGNOSIS — S9306XA Dislocation of unspecified ankle joint, initial encounter: Secondary | ICD-10-CM | POA: Diagnosis not present

## 2012-10-25 DIAGNOSIS — E785 Hyperlipidemia, unspecified: Secondary | ICD-10-CM | POA: Diagnosis present

## 2012-10-25 DIAGNOSIS — S92213A Displaced fracture of cuboid bone of unspecified foot, initial encounter for closed fracture: Secondary | ICD-10-CM | POA: Diagnosis not present

## 2012-10-25 DIAGNOSIS — D128 Benign neoplasm of rectum: Secondary | ICD-10-CM

## 2012-10-25 DIAGNOSIS — S93439A Sprain of tibiofibular ligament of unspecified ankle, initial encounter: Secondary | ICD-10-CM | POA: Diagnosis not present

## 2012-10-25 DIAGNOSIS — S82851A Displaced trimalleolar fracture of right lower leg, initial encounter for closed fracture: Secondary | ICD-10-CM

## 2012-10-25 DIAGNOSIS — S82853A Displaced trimalleolar fracture of unspecified lower leg, initial encounter for closed fracture: Principal | ICD-10-CM | POA: Diagnosis present

## 2012-10-25 DIAGNOSIS — Z21 Asymptomatic human immunodeficiency virus [HIV] infection status: Secondary | ICD-10-CM | POA: Diagnosis present

## 2012-10-25 DIAGNOSIS — G609 Hereditary and idiopathic neuropathy, unspecified: Secondary | ICD-10-CM | POA: Diagnosis present

## 2012-10-25 DIAGNOSIS — R29818 Other symptoms and signs involving the nervous system: Secondary | ICD-10-CM | POA: Diagnosis not present

## 2012-10-25 DIAGNOSIS — M1990 Unspecified osteoarthritis, unspecified site: Secondary | ICD-10-CM

## 2012-10-25 DIAGNOSIS — M19049 Primary osteoarthritis, unspecified hand: Secondary | ICD-10-CM | POA: Diagnosis present

## 2012-10-25 DIAGNOSIS — I10 Essential (primary) hypertension: Secondary | ICD-10-CM

## 2012-10-25 DIAGNOSIS — Z973 Presence of spectacles and contact lenses: Secondary | ICD-10-CM

## 2012-10-25 DIAGNOSIS — K219 Gastro-esophageal reflux disease without esophagitis: Secondary | ICD-10-CM | POA: Diagnosis not present

## 2012-10-25 DIAGNOSIS — M25579 Pain in unspecified ankle and joints of unspecified foot: Secondary | ICD-10-CM | POA: Diagnosis not present

## 2012-10-25 DIAGNOSIS — G629 Polyneuropathy, unspecified: Secondary | ICD-10-CM

## 2012-10-25 DIAGNOSIS — N390 Urinary tract infection, site not specified: Secondary | ICD-10-CM | POA: Diagnosis not present

## 2012-10-25 DIAGNOSIS — J42 Unspecified chronic bronchitis: Secondary | ICD-10-CM

## 2012-10-25 DIAGNOSIS — M810 Age-related osteoporosis without current pathological fracture: Secondary | ICD-10-CM | POA: Diagnosis present

## 2012-10-25 DIAGNOSIS — E876 Hypokalemia: Secondary | ICD-10-CM

## 2012-10-25 DIAGNOSIS — H9190 Unspecified hearing loss, unspecified ear: Secondary | ICD-10-CM

## 2012-10-25 DIAGNOSIS — E2749 Other adrenocortical insufficiency: Secondary | ICD-10-CM | POA: Diagnosis present

## 2012-10-25 DIAGNOSIS — J40 Bronchitis, not specified as acute or chronic: Secondary | ICD-10-CM | POA: Diagnosis present

## 2012-10-25 DIAGNOSIS — IMO0002 Reserved for concepts with insufficient information to code with codable children: Secondary | ICD-10-CM | POA: Diagnosis not present

## 2012-10-25 DIAGNOSIS — Z01818 Encounter for other preprocedural examination: Secondary | ICD-10-CM | POA: Diagnosis not present

## 2012-10-25 DIAGNOSIS — R2689 Other abnormalities of gait and mobility: Secondary | ICD-10-CM

## 2012-10-25 DIAGNOSIS — W19XXXA Unspecified fall, initial encounter: Secondary | ICD-10-CM | POA: Diagnosis present

## 2012-10-25 DIAGNOSIS — T148XXA Other injury of unspecified body region, initial encounter: Secondary | ICD-10-CM | POA: Diagnosis not present

## 2012-10-25 DIAGNOSIS — G589 Mononeuropathy, unspecified: Secondary | ICD-10-CM | POA: Diagnosis present

## 2012-10-25 DIAGNOSIS — R296 Repeated falls: Secondary | ICD-10-CM

## 2012-10-25 DIAGNOSIS — R32 Unspecified urinary incontinence: Secondary | ICD-10-CM

## 2012-10-25 DIAGNOSIS — S82899A Other fracture of unspecified lower leg, initial encounter for closed fracture: Secondary | ICD-10-CM | POA: Diagnosis not present

## 2012-10-25 HISTORY — DX: Presence of spectacles and contact lenses: Z97.3

## 2012-10-25 HISTORY — DX: Other abnormalities of gait and mobility: R26.89

## 2012-10-25 HISTORY — DX: Unspecified osteoarthritis, unspecified site: M19.90

## 2012-10-25 HISTORY — DX: Unspecified hearing loss, unspecified ear: H91.90

## 2012-10-25 HISTORY — PX: EXTERNAL FIXATION LEG: SHX1549

## 2012-10-25 HISTORY — DX: Other adrenocortical insufficiency: E27.49

## 2012-10-25 HISTORY — DX: Unspecified chronic bronchitis: J42

## 2012-10-25 LAB — SURGICAL PCR SCREEN: MRSA, PCR: NEGATIVE

## 2012-10-25 LAB — BASIC METABOLIC PANEL
BUN: 22 mg/dL (ref 6–23)
CO2: 29 mEq/L (ref 19–32)
Chloride: 99 mEq/L (ref 96–112)
Creatinine, Ser: 1.06 mg/dL (ref 0.50–1.10)
Glucose, Bld: 108 mg/dL — ABNORMAL HIGH (ref 70–99)

## 2012-10-25 LAB — CBC WITH DIFFERENTIAL/PLATELET
Basophils Absolute: 0.1 10*3/uL (ref 0.0–0.1)
Eosinophils Absolute: 0.2 10*3/uL (ref 0.0–0.7)
Eosinophils Relative: 2 % (ref 0–5)
HCT: 42.5 % (ref 36.0–46.0)
Lymphocytes Relative: 30 % (ref 12–46)
MCH: 31.4 pg (ref 26.0–34.0)
MCHC: 33.9 g/dL (ref 30.0–36.0)
MCV: 92.8 fL (ref 78.0–100.0)
Monocytes Absolute: 1 10*3/uL (ref 0.1–1.0)
RDW: 14.1 % (ref 11.5–15.5)
WBC: 10.2 10*3/uL (ref 4.0–10.5)

## 2012-10-25 LAB — TROPONIN I: Troponin I: <0.3 ng/mL (ref <0.30)

## 2012-10-25 LAB — HEMOGLOBIN A1C: Mean Plasma Glucose: 120 mg/dL — ABNORMAL HIGH (ref <117)

## 2012-10-25 SURGERY — EXTERNAL FIXATION, LOWER EXTREMITY
Anesthesia: General | Site: Ankle | Laterality: Right | Wound class: Clean

## 2012-10-25 MED ORDER — OXYCODONE HCL 5 MG/5ML PO SOLN: 5.0000 mg | Freq: Once | ORAL | Status: DC | PRN

## 2012-10-25 MED ORDER — FENTANYL CITRATE 0.05 MG/ML IJ SOLN
INTRAMUSCULAR | Status: AC
  Filled 2012-10-25: qty 2

## 2012-10-25 MED ORDER — ONDANSETRON HCL 4 MG PO TABS: 4.0000 mg | ORAL_TABLET | Freq: Four times a day (QID) | ORAL | Status: DC | PRN

## 2012-10-25 MED ORDER — METHOCARBAMOL 100 MG/ML IJ SOLN
1000.0000 mg | Freq: Four times a day (QID) | INTRAMUSCULAR | Status: DC
  Filled 2012-10-25 (×3): qty 10

## 2012-10-25 MED ORDER — METOPROLOL TARTRATE 12.5 MG HALF TABLET
12.5000 mg | ORAL_TABLET | Freq: Two times a day (BID) | ORAL | Status: DC
  Administered 2012-10-26 – 2012-10-28 (×5): 12.5 mg via ORAL
  Filled 2012-10-25 (×7): qty 1

## 2012-10-25 MED ORDER — FENTANYL CITRATE 0.05 MG/ML IJ SOLN
INTRAMUSCULAR | Status: DC | PRN
  Administered 2012-10-25: 100 ug via INTRAVENOUS
  Administered 2012-10-25 (×2): 50 ug via INTRAVENOUS

## 2012-10-25 MED ORDER — LOSARTAN POTASSIUM 50 MG PO TABS
100.0000 mg | ORAL_TABLET | Freq: Every day | ORAL | Status: DC
  Administered 2012-10-26 – 2012-10-28 (×3): 100 mg via ORAL
  Filled 2012-10-25 (×4): qty 2

## 2012-10-25 MED ORDER — ARTIFICIAL TEARS OP OINT
TOPICAL_OINTMENT | OPHTHALMIC | Status: DC | PRN
  Administered 2012-10-25: 1 via OPHTHALMIC

## 2012-10-25 MED ORDER — CEFAZOLIN SODIUM 1-5 GM-% IV SOLN
1.0000 g | Freq: Once | INTRAVENOUS | Status: AC
  Administered 2012-10-25: 1 g via INTRAVENOUS
  Filled 2012-10-25: qty 50

## 2012-10-25 MED ORDER — METHOCARBAMOL 500 MG PO TABS
500.0000 mg | ORAL_TABLET | Freq: Four times a day (QID) | ORAL | Status: DC | PRN
  Administered 2012-10-26: 1000 mg via ORAL
  Filled 2012-10-25 (×2): qty 2

## 2012-10-25 MED ORDER — VITAMIN D3 25 MCG (1000 UNIT) PO TABS
5000.0000 [IU] | ORAL_TABLET | Freq: Every day | ORAL | Status: DC
  Administered 2012-10-26 – 2012-10-28 (×3): 5000 [IU] via ORAL
  Filled 2012-10-25 (×3): qty 5

## 2012-10-25 MED ORDER — HYDROMORPHONE HCL PF 1 MG/ML IJ SOLN: 0.2500 mg | INTRAMUSCULAR | Status: DC | PRN

## 2012-10-25 MED ORDER — ONDANSETRON HCL 4 MG/2ML IJ SOLN: 4.0000 mg | Freq: Four times a day (QID) | INTRAMUSCULAR | Status: DC | PRN

## 2012-10-25 MED ORDER — ENOXAPARIN SODIUM 40 MG/0.4ML ~~LOC~~ SOLN
40.0000 mg | SUBCUTANEOUS | Status: DC
  Administered 2012-10-26 – 2012-10-28 (×3): 40 mg via SUBCUTANEOUS
  Filled 2012-10-25 (×4): qty 0.4

## 2012-10-25 MED ORDER — CEFAZOLIN SODIUM 1-5 GM-% IV SOLN
1.0000 g | Freq: Three times a day (TID) | INTRAVENOUS | Status: AC
  Administered 2012-10-26 (×3): 1 g via INTRAVENOUS
  Filled 2012-10-25 (×3): qty 50

## 2012-10-25 MED ORDER — OXYCODONE HCL 5 MG PO TABS: 5.0000 mg | ORAL_TABLET | Freq: Once | ORAL | Status: DC | PRN

## 2012-10-25 MED ORDER — SENNA 8.6 MG PO TABS
1.0000 | ORAL_TABLET | Freq: Two times a day (BID) | ORAL | Status: DC
  Administered 2012-10-26 – 2012-10-27 (×4): 8.6 mg via ORAL
  Filled 2012-10-25 (×7): qty 1

## 2012-10-25 MED ORDER — HYDROCORTISONE SOD SUCCINATE 100 MG IJ SOLR
25.0000 mg | Freq: Three times a day (TID) | INTRAMUSCULAR | Status: AC
  Administered 2012-10-26 (×2): 25 mg via INTRAVENOUS
  Filled 2012-10-25 (×2): qty 0.5

## 2012-10-25 MED ORDER — PREDNISONE 10 MG PO TABS
10.0000 mg | ORAL_TABLET | Freq: Every day | ORAL | Status: DC
Start: 2012-10-27 — End: 2012-10-28
  Administered 2012-10-27 – 2012-10-28 (×2): 10 mg via ORAL
  Filled 2012-10-25 (×2): qty 1

## 2012-10-25 MED ORDER — EPHEDRINE SULFATE 50 MG/ML IJ SOLN
INTRAMUSCULAR | Status: DC | PRN
  Administered 2012-10-25: 10 mg via INTRAVENOUS
  Administered 2012-10-25: 15 mg via INTRAVENOUS
  Administered 2012-10-25: 5 mg via INTRAVENOUS
  Administered 2012-10-25 (×2): 10 mg via INTRAVENOUS

## 2012-10-25 MED ORDER — PROPOFOL 10 MG/ML IV BOLUS
INTRAVENOUS | Status: DC | PRN
  Administered 2012-10-25: 150 mg via INTRAVENOUS
  Administered 2012-10-25: 50 mg via INTRAVENOUS

## 2012-10-25 MED ORDER — VITAMIN D-3 125 MCG (5000 UT) PO TABS: 1.0000 | ORAL_TABLET | Freq: Every day | ORAL | Status: DC

## 2012-10-25 MED ORDER — ETOMIDATE 2 MG/ML IV SOLN
INTRAVENOUS | Status: DC | PRN
  Administered 2012-10-25: 5 mg via INTRAVENOUS

## 2012-10-25 MED ORDER — POLYETHYLENE GLYCOL 3350 17 G PO PACK
17.0000 g | PACK | Freq: Every day | ORAL | Status: DC
  Administered 2012-10-27: 17 g via ORAL
  Filled 2012-10-25 (×3): qty 1

## 2012-10-25 MED ORDER — PHENYLEPHRINE HCL 10 MG/ML IJ SOLN
10.0000 mg | INTRAVENOUS | Status: DC | PRN
  Administered 2012-10-25: 20 ug/min via INTRAVENOUS

## 2012-10-25 MED ORDER — CEFAZOLIN SODIUM-DEXTROSE 2-3 GM-% IV SOLR
2.0000 g | INTRAVENOUS | Status: AC
  Administered 2012-10-25: 2 g via INTRAVENOUS
  Filled 2012-10-25: qty 50

## 2012-10-25 MED ORDER — HYDROMORPHONE HCL PF 1 MG/ML IJ SOLN
0.5000 mg | INTRAMUSCULAR | Status: DC | PRN
  Administered 2012-10-26: 1 mg via INTRAVENOUS
  Filled 2012-10-25: qty 1

## 2012-10-25 MED ORDER — 0.9 % SODIUM CHLORIDE (POUR BTL) OPTIME
TOPICAL | Status: DC | PRN
  Administered 2012-10-25: 1000 mL

## 2012-10-25 MED ORDER — ACETAMINOPHEN 10 MG/ML IV SOLN: 1000.0000 mg | Freq: Once | INTRAVENOUS | Status: DC

## 2012-10-25 MED ORDER — OXYBUTYNIN CHLORIDE 5 MG PO TABS
5.0000 mg | ORAL_TABLET | Freq: Every day | ORAL | Status: DC
  Administered 2012-10-26 – 2012-10-28 (×3): 5 mg via ORAL
  Filled 2012-10-25 (×4): qty 1

## 2012-10-25 MED ORDER — CEFAZOLIN SODIUM 1-5 GM-% IV SOLN
INTRAVENOUS | Status: AC
  Filled 2012-10-25: qty 50

## 2012-10-25 MED ORDER — SODIUM CHLORIDE 0.9 % IJ SOLN
3.0000 mL | Freq: Two times a day (BID) | INTRAMUSCULAR | Status: DC
  Administered 2012-10-26 – 2012-10-28 (×5): 3 mL via INTRAVENOUS

## 2012-10-25 MED ORDER — ACETAMINOPHEN 10 MG/ML IV SOLN
1000.0000 mg | Freq: Four times a day (QID) | INTRAVENOUS | Status: DC
  Administered 2012-10-25: 1000 mg via INTRAVENOUS
  Filled 2012-10-25 (×5): qty 100

## 2012-10-25 MED ORDER — FENTANYL CITRATE 0.05 MG/ML IJ SOLN
100.0000 ug | Freq: Once | INTRAMUSCULAR | Status: AC
  Administered 2012-10-25: 100 ug via INTRAVENOUS
  Filled 2012-10-25: qty 2

## 2012-10-25 MED ORDER — METHOCARBAMOL 100 MG/ML IJ SOLN
1000.0000 mg | Freq: Four times a day (QID) | INTRAVENOUS | Status: DC
  Filled 2012-10-25 (×4): qty 10

## 2012-10-25 MED ORDER — MIDAZOLAM HCL 2 MG/2ML IJ SOLN: 0.5000 mg | Freq: Once | INTRAMUSCULAR | Status: DC | PRN

## 2012-10-25 MED ORDER — OXYCODONE HCL 5 MG PO TABS: 5.0000 mg | ORAL_TABLET | ORAL | Status: DC | PRN

## 2012-10-25 MED ORDER — ONDANSETRON HCL 4 MG/2ML IJ SOLN
INTRAMUSCULAR | Status: DC | PRN
  Administered 2012-10-25: 4 mg via INTRAVENOUS

## 2012-10-25 MED ORDER — FENTANYL CITRATE 0.05 MG/ML IJ SOLN
50.0000 ug | Freq: Once | INTRAMUSCULAR | Status: AC
  Administered 2012-10-25: 50 ug via INTRAVENOUS
  Filled 2012-10-25: qty 2

## 2012-10-25 MED ORDER — PANTOPRAZOLE SODIUM 40 MG PO TBEC
40.0000 mg | DELAYED_RELEASE_TABLET | Freq: Two times a day (BID) | ORAL | Status: DC
  Administered 2012-10-26 – 2012-10-28 (×5): 40 mg via ORAL
  Filled 2012-10-25 (×5): qty 1

## 2012-10-25 MED ORDER — PROMETHAZINE HCL 25 MG/ML IJ SOLN: 6.2500 mg | INTRAMUSCULAR | Status: DC | PRN

## 2012-10-25 MED ORDER — HYDROMORPHONE HCL PF 1 MG/ML IJ SOLN
1.0000 mg | INTRAMUSCULAR | Status: DC | PRN
  Administered 2012-10-25: 1 mg via INTRAVENOUS
  Filled 2012-10-25: qty 1

## 2012-10-25 MED ORDER — METHOCARBAMOL 100 MG/ML IJ SOLN
500.0000 mg | Freq: Four times a day (QID) | INTRAVENOUS | Status: DC | PRN
  Filled 2012-10-25: qty 10

## 2012-10-25 MED ORDER — METOCLOPRAMIDE HCL 10 MG PO TABS: 5.0000 mg | ORAL_TABLET | Freq: Three times a day (TID) | ORAL | Status: DC | PRN

## 2012-10-25 MED ORDER — ADULT MULTIVITAMIN W/MINERALS CH
1.0000 | ORAL_TABLET | Freq: Every day | ORAL | Status: DC
  Administered 2012-10-26 – 2012-10-28 (×3): 1 via ORAL
  Filled 2012-10-25 (×3): qty 1

## 2012-10-25 MED ORDER — ETOMIDATE 2 MG/ML IV SOLN
5.0000 mg | Freq: Once | INTRAVENOUS | Status: AC
  Administered 2012-10-25: 5 mg via INTRAVENOUS
  Filled 2012-10-25: qty 10

## 2012-10-25 MED ORDER — DOCUSATE SODIUM 100 MG PO CAPS
100.0000 mg | ORAL_CAPSULE | Freq: Two times a day (BID) | ORAL | Status: DC
  Administered 2012-10-26 – 2012-10-28 (×5): 100 mg via ORAL
  Filled 2012-10-25 (×7): qty 1

## 2012-10-25 MED ORDER — ACETAMINOPHEN 325 MG PO TABS: 650.0000 mg | ORAL_TABLET | Freq: Four times a day (QID) | ORAL | Status: DC | PRN

## 2012-10-25 MED ORDER — LACTATED RINGERS IV SOLN
INTRAVENOUS | Status: DC | PRN
  Administered 2012-10-25 (×2): via INTRAVENOUS

## 2012-10-25 MED ORDER — BISACODYL 10 MG RE SUPP: 10.0000 mg | Freq: Every day | RECTAL | Status: DC | PRN

## 2012-10-25 MED ORDER — TETANUS-DIPHTH-ACELL PERTUSSIS 5-2.5-18.5 LF-MCG/0.5 IM SUSP
0.5000 mL | Freq: Once | INTRAMUSCULAR | Status: AC
  Administered 2012-10-25: 0.5 mL via INTRAMUSCULAR
  Filled 2012-10-25: qty 0.5

## 2012-10-25 MED ORDER — METOCLOPRAMIDE HCL 5 MG/ML IJ SOLN: 5.0000 mg | Freq: Three times a day (TID) | INTRAMUSCULAR | Status: DC | PRN

## 2012-10-25 MED ORDER — METHYLPREDNISOLONE SODIUM SUCC 125 MG IJ SOLR
INTRAMUSCULAR | Status: DC | PRN
  Administered 2012-10-25: 25 mg via INTRAVENOUS

## 2012-10-25 MED ORDER — SODIUM CHLORIDE 0.9 % IV SOLN
INTRAVENOUS | Status: DC
  Administered 2012-10-25 – 2012-10-26 (×2): via INTRAVENOUS

## 2012-10-25 MED ORDER — MOMETASONE FURO-FORMOTEROL FUM 100-5 MCG/ACT IN AERO
2.0000 | INHALATION_SPRAY | Freq: Two times a day (BID) | RESPIRATORY_TRACT | Status: DC
  Administered 2012-10-26 – 2012-10-28 (×5): 2 via RESPIRATORY_TRACT
  Filled 2012-10-25: qty 8.8

## 2012-10-25 MED ORDER — ALBUTEROL SULFATE (5 MG/ML) 0.5% IN NEBU: 2.5000 mg | INHALATION_SOLUTION | RESPIRATORY_TRACT | Status: DC | PRN

## 2012-10-25 MED ORDER — PHENYLEPHRINE HCL 10 MG/ML IJ SOLN
INTRAMUSCULAR | Status: DC | PRN
  Administered 2012-10-25 (×6): 80 ug via INTRAVENOUS

## 2012-10-25 MED ORDER — DOCUSATE SODIUM 100 MG PO CAPS: 100.0000 mg | ORAL_CAPSULE | Freq: Two times a day (BID) | ORAL | Status: DC

## 2012-10-25 MED ORDER — MAGNESIUM CITRATE PO SOLN: 1.0000 | Freq: Once | ORAL | Status: AC | PRN

## 2012-10-25 MED ORDER — DIAZEPAM 5 MG/ML IJ SOLN
2.5000 mg | Freq: Once | INTRAMUSCULAR | Status: AC
  Administered 2012-10-25: 2.5 mg via INTRAVENOUS
  Filled 2012-10-25: qty 2

## 2012-10-25 MED ORDER — ACETAMINOPHEN 650 MG RE SUPP: 650.0000 mg | Freq: Four times a day (QID) | RECTAL | Status: DC | PRN

## 2012-10-25 MED ORDER — LIDOCAINE HCL (CARDIAC) 20 MG/ML IV SOLN
INTRAVENOUS | Status: DC | PRN
  Administered 2012-10-25: 100 mg via INTRAVENOUS

## 2012-10-25 MED ORDER — ACETAMINOPHEN 10 MG/ML IV SOLN
1000.0000 mg | Freq: Four times a day (QID) | INTRAVENOUS | Status: AC
  Administered 2012-10-26 (×2): 1000 mg via INTRAVENOUS
  Filled 2012-10-25 (×7): qty 100

## 2012-10-25 MED ORDER — PREDNISONE 10 MG PO TABS
10.0000 mg | ORAL_TABLET | Freq: Every day | ORAL | Status: DC
  Filled 2012-10-25: qty 1

## 2012-10-25 MED ORDER — SIMVASTATIN 20 MG PO TABS
20.0000 mg | ORAL_TABLET | Freq: Every day | ORAL | Status: DC
  Administered 2012-10-26 – 2012-10-27 (×2): 20 mg via ORAL
  Filled 2012-10-25 (×4): qty 1

## 2012-10-25 MED ORDER — LIDOCAINE HCL 2 % EX GEL
CUTANEOUS | Status: AC
  Filled 2012-10-25: qty 20

## 2012-10-25 MED ORDER — MULTIVITAMINS PO CAPS: 1.0000 | ORAL_CAPSULE | Freq: Every day | ORAL | Status: DC

## 2012-10-25 MED ORDER — DEXAMETHASONE SODIUM PHOSPHATE 4 MG/ML IJ SOLN
INTRAMUSCULAR | Status: DC | PRN
  Administered 2012-10-25: 4 mg via INTRAVENOUS

## 2012-10-25 MED ORDER — MEPERIDINE HCL 25 MG/ML IJ SOLN: 6.2500 mg | INTRAMUSCULAR | Status: DC | PRN

## 2012-10-25 MED ORDER — LACTATED RINGERS IV SOLN
INTRAVENOUS | Status: DC
  Administered 2012-10-25: 18:00:00 via INTRAVENOUS

## 2012-10-25 MED ORDER — ROCURONIUM BROMIDE 100 MG/10ML IV SOLN
INTRAVENOUS | Status: DC | PRN
  Administered 2012-10-25: 35 mg via INTRAVENOUS

## 2012-10-25 SURGICAL SUPPLY — 74 items
BANDAGE CONFORM 2  STR LF (GAUZE/BANDAGES/DRESSINGS) ×2 IMPLANT
BANDAGE ELASTIC 3 VELCRO ST LF (GAUZE/BANDAGES/DRESSINGS) ×4 IMPLANT
BANDAGE ELASTIC 4 VELCRO ST LF (GAUZE/BANDAGES/DRESSINGS) IMPLANT
BANDAGE ELASTIC 6 VELCRO ST LF (GAUZE/BANDAGES/DRESSINGS) ×1 IMPLANT
BANDAGE ESMARK 6X9 LF (GAUZE/BANDAGES/DRESSINGS) ×1 IMPLANT
BANDAGE GAUZE ELAST BULKY 4 IN (GAUZE/BANDAGES/DRESSINGS) ×2 IMPLANT
BAR EXFX 150X11 NS LF (EXFIX) ×2
BAR EXFX 350X11 NS LF (EXFIX) ×1
BAR EXFX 400X11 NS LF (EXFIX) ×1
BAR GLASS FIBER EXFX 11X150 (EXFIX) ×2 IMPLANT
BAR GLASS FIBER EXFX 11X350 (EXFIX) ×1 IMPLANT
BAR GLASS FIBER EXFX 11X400 (EXFIX) ×1 IMPLANT
BNDG CMPR 9X6 STRL LF SNTH (GAUZE/BANDAGES/DRESSINGS) ×1
BNDG COHESIVE 6X5 TAN STRL LF (GAUZE/BANDAGES/DRESSINGS) ×1 IMPLANT
BNDG ESMARK 6X9 LF (GAUZE/BANDAGES/DRESSINGS) ×2
BRUSH SCRUB DISP (MISCELLANEOUS) ×4 IMPLANT
CLAMP BLUE BAR TO BAR (MISCELLANEOUS) ×4 IMPLANT
CLAMP BLUE BAR TO PIN (MISCELLANEOUS) ×8 IMPLANT
CLEANER TIP ELECTROSURG 2X2 (MISCELLANEOUS) ×2 IMPLANT
CLOTH BEACON ORANGE TIMEOUT ST (SAFETY) ×2 IMPLANT
COVER SURGICAL LIGHT HANDLE (MISCELLANEOUS) ×4 IMPLANT
CUFF TOURNIQUET SINGLE 18IN (TOURNIQUET CUFF) IMPLANT
CUFF TOURNIQUET SINGLE 24IN (TOURNIQUET CUFF) IMPLANT
CUFF TOURNIQUET SINGLE 34IN LL (TOURNIQUET CUFF) IMPLANT
DRAPE C-ARM 42X72 X-RAY (DRAPES) IMPLANT
DRAPE C-ARMOR (DRAPES) ×3 IMPLANT
DRAPE U-SHAPE 47X51 STRL (DRAPES) ×2 IMPLANT
DRSG ADAPTIC 3X8 NADH LF (GAUZE/BANDAGES/DRESSINGS) IMPLANT
DRSG MEPITEL 4X7.2 (GAUZE/BANDAGES/DRESSINGS) ×1 IMPLANT
ELECT REM PT RETURN 9FT ADLT (ELECTROSURGICAL) ×2
ELECTRODE REM PT RTRN 9FT ADLT (ELECTROSURGICAL) ×1 IMPLANT
EVACUATOR 1/8 PVC DRAIN (DRAIN) IMPLANT
GLOVE BIO SURGEON STRL SZ7.5 (GLOVE) ×2 IMPLANT
GLOVE BIO SURGEON STRL SZ8 (GLOVE) ×2 IMPLANT
GLOVE BIOGEL PI IND STRL 7.5 (GLOVE) ×1 IMPLANT
GLOVE BIOGEL PI IND STRL 8 (GLOVE) ×1 IMPLANT
GLOVE BIOGEL PI INDICATOR 7.5 (GLOVE) ×1
GLOVE BIOGEL PI INDICATOR 8 (GLOVE) ×1
GOWN PREVENTION PLUS XLARGE (GOWN DISPOSABLE) ×2 IMPLANT
GOWN STRL NON-REIN LRG LVL3 (GOWN DISPOSABLE) ×4 IMPLANT
HALF PIN 3MM (PIN) ×1 IMPLANT
HANDPIECE INTERPULSE COAX TIP (DISPOSABLE)
KIT BASIN OR (CUSTOM PROCEDURE TRAY) ×2 IMPLANT
KIT ROOM TURNOVER OR (KITS) ×2 IMPLANT
MANIFOLD NEPTUNE II (INSTRUMENTS) ×1 IMPLANT
NEEDLE 22X1 1/2 (OR ONLY) (NEEDLE) IMPLANT
NS IRRIG 1000ML POUR BTL (IV SOLUTION) ×2 IMPLANT
PACK ORTHO EXTREMITY (CUSTOM PROCEDURE TRAY) ×2 IMPLANT
PAD ARMBOARD 7.5X6 YLW CONV (MISCELLANEOUS) ×4 IMPLANT
PADDING CAST COTTON 6X4 STRL (CAST SUPPLIES) ×3 IMPLANT
PIN CLAMP 2BAR 75MM BLUE (PIN) ×1 IMPLANT
PIN HALF YELLOW 5X160X35 (PIN) ×4 IMPLANT
PIN TRANSFIXING 5.0 (PIN) ×1 IMPLANT
SET HNDPC FAN SPRY TIP SCT (DISPOSABLE) IMPLANT
SPONGE GAUZE 4X4 12PLY (GAUZE/BANDAGES/DRESSINGS) IMPLANT
SPONGE LAP 18X18 X RAY DECT (DISPOSABLE) ×2 IMPLANT
SPONGE SCRUB IODOPHOR (GAUZE/BANDAGES/DRESSINGS) ×2 IMPLANT
STAPLER VISISTAT 35W (STAPLE) IMPLANT
STOCKINETTE IMPERVIOUS LG (DRAPES) ×2 IMPLANT
STRIP CLOSURE SKIN 1/2X4 (GAUZE/BANDAGES/DRESSINGS) IMPLANT
SUCTION FRAZIER TIP 10 FR DISP (SUCTIONS) IMPLANT
SUT ETHILON 3 0 PS 1 (SUTURE) IMPLANT
SUT ETHILON 4 0 PS 2 18 (SUTURE) ×2 IMPLANT
SUT VIC AB 0 CT1 27 (SUTURE)
SUT VIC AB 0 CT1 27XBRD ANBCTR (SUTURE) ×2 IMPLANT
SUT VIC AB 2-0 CT1 27 (SUTURE)
SUT VIC AB 2-0 CT1 TAPERPNT 27 (SUTURE) ×2 IMPLANT
SYR CONTROL 10ML LL (SYRINGE) IMPLANT
TOWEL OR 17X24 6PK STRL BLUE (TOWEL DISPOSABLE) ×2 IMPLANT
TOWEL OR 17X26 10 PK STRL BLUE (TOWEL DISPOSABLE) ×3 IMPLANT
TUBE CONNECTING 12X1/4 (SUCTIONS) ×2 IMPLANT
UNDERPAD 30X30 INCONTINENT (UNDERPADS AND DIAPERS) ×2 IMPLANT
WATER STERILE IRR 1000ML POUR (IV SOLUTION) ×2 IMPLANT
YANKAUER SUCT BULB TIP NO VENT (SUCTIONS) ×2 IMPLANT

## 2012-10-25 NOTE — Transfer of Care (Signed)
Immediate Anesthesia Transfer of Care Note  Patient: Marie [name]  Procedure(s) Performed: Procedure(s) (LRB) with comments: EXTERNAL FIXATION LEG (Right)  Patient Location: PACU  Anesthesia Type:General  Level of Consciousness: awake, patient cooperative and responds to stimulation  Airway & Oxygen Therapy: Patient Spontanous Breathing and Patient connected to nasal cannula oxygen  Post-op Assessment: Report given to PACU RN, Post -op Vital signs reviewed and stable and Patient moving all extremities X 4  Post vital signs: Reviewed and stable  Complications: No apparent anesthesia complications

## 2012-10-25 NOTE — Progress Notes (Signed)
Orthopedic Tech Progress Note Patient Details:  Marie [name] January 17, 1936 [id number]  Ortho Devices Type of Ortho Device: Post (short leg) splint Ortho Device/Splint Location: RIGHT POSTERIOR WITH STIRRUP SPLINT, ANKLE REDUCED BY ER DOCTOR Ortho Device/Splint Interventions: Application   Cammer, Mickie Bail 10/25/2012, 9:21 AM

## 2012-10-25 NOTE — ED Notes (Signed)
Patient had been to bathroom and was going back to bed.  Patient claims she heard a "pop" when she fell.  Patient claims she does not know why she fell, but denies tripping over anything.  Patient claims "my balance is bad and I trip a lot".

## 2012-10-25 NOTE — ED Notes (Signed)
PT REPORTS LEG CRAMPING CONTINUES. AWAITING ORDERS FROM ADMITTING DOCTOR WHO IS AWARE OF PROBLEM.

## 2012-10-25 NOTE — Consult Note (Signed)
Orthopaedic Trauma Service Consult note  Reason for Consult: Right trimalleolar ankle fracture dislocation Referring Physician: D. Bebe Shaggy, MD (ED physician)   HPI:   The patient is a 77 year old Caucasian female with numerous medical comorbidities who was at home earlier this morning and sustained a ground-level fall at approximately 0 6:30. Patient does not recall the exact mechanism by which she fell other than her feet got tangled underneath her and she fell resulting in a injury to her right ankle. Patient does have a history of multiple falls and falls on a regular basis. She does use a cane and a walker to assist with ambulation. Patient had immediate deformity of her right ankle and was brought to [place] for evaluation where she was found to have a right ankle fracture dislocation. Patient did have a reduction performed in the emergency department and the orthopedic trauma service was consult for definitive treatment. Do to her medical issues patient was admitted to the medicine service. We did review the post reduction films and it appeared that the talus was still subluxed relative to the tibia. Because of this we decided to perform another reduction maneuver to get better alignment. Patient was seen and evaluated in the emergency department. At the time of evaluation patient is in pod C room 29. She complains of right ankle pain. Denies any injury or other pain elsewhere. Denies any chest pain or shortness of breath no headaches, no lightheadedness. No blurred vision. Abdominal pain.   Again patient suffers from frequent falls. Per report this has been worked up by her primary care including an MRI which was normal. Her husband and her balance has been somewhat better over the last several weeks but again not perfect.   Past Medical History  Diagnosis Date  . Incontinence   . Osteoarthritis     hands,   . Wears dentures   . Osteoporosis   . Bulging disc   . Hyperlipidemia     . Chronic bronchitis     due to congestion at times, on prednisone and advair  . GERD (gastroesophageal reflux disease)   . Neuropathy     legs stay numb   . Depression     many years ago  . Hypertension     followed by intern med Dr. Kendrick Fries [phone]  . Tubulovillous adenoma of rectum   . Urinary incontinence   . Balance disorder 2008.      Falls a lot.  She can be standing and then leans too far over to one side   . Wears glasses   . Hearing loss     mild  . Iatrogenic adrenal insufficiency     Past Surgical History  Procedure Date  . Incontinence surgery     multiple procedures, not cured  . Rectal surgery     by dr. Carolynne Edouard, removal of polyp  . Appendectomy   . Abdominal hysterectomy   . Tonsillectomy   . Eye surgery     bilateral cataract surgery and lens implant  . Dilation and curettage of uterus   . Rectal biopsy 09/21/2011    Procedure: BIOPSY RECTAL;  Surgeon: Robyne Askew, MD;  Location: [facility] OR;  Service: General;  Laterality: N/A;  3-4 cm  . Finger surgery     fusions and debridements for OA    Family History  Problem Relation Age of Onset  . Cancer Sister     pt unaware of what kind  . High blood pressure Mother   .  Heart attack Father     Social History:  reports that she has never smoked. She has never used smokeless tobacco. She reports that she does not drink alcohol or use illicit drugs.  Allergies:  Allergies  Allergen Reactions  . Codeine Other (See Comments)    hallucinations  . Demerol Other (See Comments)    hallucinations  . Morphine And Related Other (See Comments)    unknown  . Other Other (See Comments)    Invan 7- pt became red all over  . Sulfate Other (See Comments)    unknown    Medications: I have reviewed the patient's current medications. See chart for home meds   Results for orders placed during the hospital encounter of 10/25/12 (from the past 48 hour(s))  BASIC METABOLIC PANEL     Status: Abnormal   Collection  Time   10/25/12  8:08 AM      Component Value Range Comment   Sodium 139  135 - 145 mEq/L    Potassium 3.1 (*) 3.5 - 5.1 mEq/L    Chloride 99  96 - 112 mEq/L    CO2 29  19 - 32 mEq/L    Glucose, Bld 108 (*) 70 - 99 mg/dL    BUN 22  6 - 23 mg/dL    Creatinine, Ser 4.09  0.50 - 1.10 mg/dL    Calcium 8.9  8.4 - 81.1 mg/dL    GFR calc non Af Amer 50 (*) >90 mL/min    GFR calc Af Amer 58 (*) >90 mL/min   CBC WITH DIFFERENTIAL     Status: Normal   Collection Time   10/25/12  8:08 AM      Component Value Range Comment   WBC 10.2  4.0 - 10.5 K/uL    RBC 4.58  3.87 - 5.11 MIL/uL    Hemoglobin 14.4  12.0 - 15.0 g/dL    HCT 91.4  78.2 - 95.6 %    MCV 92.8  78.0 - 100.0 fL    MCH 31.4  26.0 - 34.0 pg    MCHC 33.9  30.0 - 36.0 g/dL    RDW 21.3  08.6 - 57.8 %    Platelets 257  150 - 400 K/uL    Neutrophils Relative 58  43 - 77 %    Neutro Abs 5.9  1.7 - 7.7 K/uL    Lymphocytes Relative 30  12 - 46 %    Lymphs Abs 3.0  0.7 - 4.0 K/uL    Monocytes Relative 9  3 - 12 %    Monocytes Absolute 1.0  0.1 - 1.0 K/uL    Eosinophils Relative 2  0 - 5 %    Eosinophils Absolute 0.2  0.0 - 0.7 K/uL    Basophils Relative 1  0 - 1 %    Basophils Absolute 0.1  0.0 - 0.1 K/uL   TYPE AND SCREEN     Status: Normal   Collection Time   10/25/12  8:10 AM      Component Value Range Comment   ABO/RH(D) A POS      Antibody Screen NEG      Sample Expiration 10/28/2012     ABO/RH     Status: Normal   Collection Time   10/25/12  8:10 AM      Component Value Range Comment   ABO/RH(D) A POS     TROPONIN I     Status: Normal   Collection Time   10/25/12  8:21 AM      Component Value Range Comment   Troponin I <0.30  <0.30 ng/mL     Dg Chest Portable 1 View  10/25/2012  *RADIOLOGY REPORT*  Clinical Data: Fall.  Preop.  PORTABLE CHEST - 1 VIEW  Comparison: 02/12/2012  Findings: Low lung volumes.  Normal heart size.  Clear lungs.  No pneumothorax.  No obvious acute bony deformity.  IMPRESSION: No active cardiopulmonary  disease.   Original Report Authenticated By: Jolaine Click, M.D.    Dg Ankle Right Port  10/25/2012  *RADIOLOGY REPORT*  Clinical Data: Fracture.  Postreduction.  PORTABLE RIGHT ANKLE - 2 VIEW  Comparison: None.  Findings: Trimalleolar fracture again noted.  Alignment is significantly improved but not anatomic, with 9 mm of separation between the medial tibial plafond and the medial talus but only 2 mm of separation laterally, and 6 mm posterior displacement of the inferior fragment of the lateral malleolar fracture with respect to the superior.  No new fracture or specific complicating feature is observed.  A fiberglass splint noted.  IMPRESSION:  1.  Significantly improved but not anatomic alignment, with mild persistent medial displacement of the tibia with respect to the talus, and mild separation of the lateral malleolar oblique fracture site.  Interval fiberglass splinting.   Original Report Authenticated By: Gaylyn Rong, M.D.    Dg Ankle Right Port  10/25/2012  *RADIOLOGY REPORT*  Clinical Data: Ankle pain post fall  PORTABLE RIGHT ANKLE - 2 VIEW  Comparison: Portable exam 0820 hours without priors for comparison.  Findings: Fracture-dislocation right ankle: Lateral dislocation of talus from tibiotalar joint. Oblique distal fibular fracture displaced laterally with mild apex medial angulation. Fracture medial malleolus displaced laterally. Displaced posterior malleolar fracture fragment.  Tarsals grossly intact. Bones appear demineralized.  IMPRESSION: Trimalleolar fracture-dislocation right ankle.   Original Report Authenticated By: Ulyses Southward, M.D.     Review of Systems  Constitutional: Negative for fever and chills.  HENT: Positive for hearing loss.   Eyes: Negative for blurred vision.  Respiratory: Negative for shortness of breath and wheezing.   Cardiovascular: Negative for chest pain and palpitations.  Gastrointestinal: Negative for nausea, vomiting and abdominal pain.    Musculoskeletal:       R ankle pain  Neurological: Positive for tingling and sensory change. Negative for headaches.       Baseline B LEx peripheral neuropathy   Blood pressure 162/68, pulse 86, temperature 98.5 F (36.9 C), temperature source Oral, resp. rate 21, height 5\' 4"  (1.626 m), weight 81.647 kg (180 lb), SpO2 96.00%. Physical Exam  Constitutional: She is oriented to person, place, and time. She appears well-developed and well-nourished. She is cooperative.       Mild distress  HENT:  Head: Normocephalic and atraumatic.  Mouth/Throat: Mucous membranes are dry.  Eyes: EOM are normal. Right eye exhibits no nystagmus. Left eye exhibits no nystagmus.  Neck: Normal range of motion and full passive range of motion without pain. Neck supple. No spinous process tenderness and no muscular tenderness present. Normal range of motion present.  Cardiovascular:       s1 and s2  Respiratory:       CTA B   GI:       Soft, NT, + BS  Musculoskeletal:       B UEx    No acute findings     Symmetric ROM B     Motor and sensory functions grossly intact    + radial pulses bilaterally  Right Lower Extremity     Splint removed     Extensive ecchymosis and swelling medially     + fx blisters medially    Swelling and ecchymosis to dorsum of R foot    + DP pulse    Dec DPN sensation, pt states that this is baseline, contralateral lower extremity is the same    SPN and TN grossly intact     EHL, FHL motor intact    No DCT    Compartments soft and NT    No pain out of proportion with passive stretching  Left Leg notable for peripheral neuropathy   Neurological: She is alert and oriented to person, place, and time.       Baseline lower extremity peripheral neuropathy   Psychiatric: She has a normal mood and affect. Her speech is normal and behavior is normal. Cognition and memory are normal.    Assessment/Plan:  77 year old female status post ground level fall with balance issue and  acute right trimalleolar ankle fracture dislocation  1. Falls  Medicine service working up and checking labs including vitamin B12, RPR and HIV screen and TSH 2. right trimalleolar ankle fracture dislocation  We did perform another reduction maneuver in the emergency department to obtain better alignment and completely reduce the talus  She does have very significant soft tissue injury at this time including swelling ecchymosis and as well as fracture blisters. This will delay definitive fixation at least 10 days or so if not more. In all probability we'll proceed to the OR tonight for external fixation. I given patient's medical comorbidities as well as her chronic prednisone use I do not feel that her skin can handle a longterm splint. In addition it would be best to be able to evaluate her soft tissue a regular basis. This would be accomplished most easily via external fixation.  The patient will be nonweightbearing until further notice  Reduction films are pending  we will check a CT scan of the foot and ankle as well to evaluate for foot fractures as well as syndesmosis  3. medical issues  Per medicine 4. pain  Ordered IV Tylenol   Narcotics as needed  Robaxin for muscle spasms 5. DVT and PE prophylaxis   Will place on Lovenox postoperatively 6. metabolic bone disease   patient with known osteoporosis but she is also on chronic steroids. I suspect that the patient for a bone density as a result of her chronic disease and chronic steroid use. Vitamin D panel is pending. She is on vitamin D supplementation as well.   Suspect that poor bone density is secondary to chronic disease and medication use. Age is also a factor as well    Her chronic disease and a chronic steroid use does pose some risks in terms of wound and soft tissue healing and may delay healing for several weeks. It definitely predisposes her to an increased risk for complications including deep wound infection, nonunion etc.    7. disposition    OR likely this evening   Patient will need a skilled nursing facility to help care for her. Do not believe that the patient's husband is capable of doing this as it would be quite an undertaking   If all goes well the patient can likely discharge to a skilled nursing facility tomorrow   [name], PA-C [facility] [phone] (P) 10/25/2012, 1:43 PM

## 2012-10-25 NOTE — Progress Notes (Signed)
NURSING PROGRESS NOTE  Charmain Diosdado Meeuwsen [id number] Admission Data: 10/25/2012 5:28 PM Attending Provider: Renae Fickle, MD NUU:[id number] DAVID, MD Code Status: full   Marie [name] is a 77 y.o. female patient admitted from ED  No acute distress noted.  No c/o shortness of breath, no c/o chest pain.  Cardiac tele # [phone], in place, cardiac monitor yields:normal sinus rhythm.  Blood pressure 113/48, pulse 77, temperature 98.7 F (37.1 C), temperature source Oral, resp. rate 18, height 5\' 4"  (1.626 m), weight 81.647 kg (180 lb), SpO2 95.00%.   IV Fluids:  IV in place, occlusive dsg intact without redness, IV cath forearm left, condition patent and no redness normal saline.   Allergies:  Codeine; Demerol; Morphine and related; Other; and Sulfate  Past Medical History:   has a past medical history of Incontinence; Osteoarthritis; Wears dentures; Osteoporosis; Bulging disc; Hyperlipidemia; Chronic bronchitis; GERD (gastroesophageal reflux disease); Neuropathy; Depression; Hypertension; Tubulovillous adenoma of rectum; Urinary incontinence; Balance disorder (2008.  ); Wears glasses; Hearing loss; and Iatrogenic adrenal insufficiency.  Past Surgical History:   has past surgical history that includes Incontinence surgery; Rectal surgery; Appendectomy; Abdominal hysterectomy; Tonsillectomy; Eye surgery; Dilation and curettage of uterus; Rectal biopsy (09/21/2011); and Finger surgery.  Social History:   reports that she has never smoked. She has never used smokeless tobacco. She reports that she does not drink alcohol or use illicit drugs.  Skin: intact  Orientation to room, and floor completed with information packet given to patient/family. Admission INP armband ID verified with patient/family, and in place.   SR up x 2, fall assessment complete, with patient and family able to verbalize understanding of risk associated with falls, and verbalized understanding to call for assistance  before getting out of bed.   Call light within reach. Patient able to voice and demonstrate understanding of unit orientation instructions.   Will cont to eval and treat per MD orders.  Julyan Gales, Elmarie Mainland, RN

## 2012-10-25 NOTE — Progress Notes (Signed)
Ring removed from patient and given to husband prior to surgery.

## 2012-10-25 NOTE — H&P (Signed)
[facility] History and Physical  Marie [name] NFA:[id number] DOB: 1936-06-13 DOA: 10/25/2012  Referring physician: Drs. Dominica Severin PCP: Nadean Corwin, MD   Chief Complaint: Fall  HPI:   The patient is a 77 year old female with history of hypertension, hyperlipidemia, chronic bronchitis, steroid dependence, urinary incontinence, and frequent falls who presents with a fall resulting in trimalleolar fracture. She was walking from the bathroom this morning towards her walker without using an assist device. The lights were on and her right ankle twisted or gave out and she fell on it, causing a fracture. She denies associated chest pain, palpitations, shortness of breath, flushing, lightheadedness, spinning room, vision darkening, muffled hearing, nausea. She did not hit her head or lose consciousness. She was in pain and transported to the ER for evaluation.   The patient states that she has baseline difficulty with balance and walks with a cane or a rolling walker. She denies lightheadedness or room spinning or floor tilting, but frequently, she leans to the side when standing and falls over. She falls in any direction, forwards, sideways, or backwards. She states she does not realize she is leaning until it is too late and then she falls over. She states she has poor feeling in her feet and her legs are numb to the level of the knees bilaterally. She wears glasses and has some mild difficulty hearing, minimizes rugs and stairs in the household. She has had a work up by her primary care doctor for her falls which included an MRI brain which was reportedly normal. She participated in vestibular therapy previously which helped her balance.   Review of Systems:  Denies fevers, chills, recent changes in hearing and vision, rhinorrhea, sinus congestion, sore throat, shortness of breath, chest pain, nausea, vomiting, diarrhea, constipation, blood in stools. She has urinary incontinence  which has not improved despite several surgeries. She denies dysuria, but always has urinary frequency. Denies lymphadenopathy, abnormal bruising or bleeding, focal weakness, but has the numbness of the legs described in HPI. She denies confusion, slurred speech, facial droop, memory loss suggestive of dementia.    Past Medical History  Diagnosis Date  . Incontinence   . Osteoarthritis     hands,   . Wears dentures   . Osteoporosis   . Bulging disc   . Hyperlipidemia   . Chronic bronchitis     due to congestion at times, on prednisone and advair  . GERD (gastroesophageal reflux disease)   . Neuropathy     legs stay numb   . Depression     many years ago  . Hypertension     followed by intern med Dr. Kendrick Fries [phone]  . Tubulovillous adenoma of rectum   . Urinary incontinence   . Balance disorder 2008.      Falls a lot.  She can be standing and then leans too far over to one side   . Wears glasses   . Hearing loss     mild  . Iatrogenic adrenal insufficiency    Past Surgical History  Procedure Date  . Incontinence surgery     multiple procedures, not cured  . Rectal surgery     by dr. Carolynne Edouard, removal of polyp  . Appendectomy   . Abdominal hysterectomy   . Tonsillectomy   . Eye surgery     bilateral cataract surgery and lens implant  . Dilation and curettage of uterus   . Rectal biopsy 09/21/2011    Procedure: BIOPSY RECTAL;  Surgeon:  Caleen Essex III, MD;  Location: [facility] OR;  Service: General;  Laterality: N/A;  3-4 cm  . Finger surgery     fusions and debridements for OA   Social History:  reports that she has never smoked. She has never used smokeless tobacco. She reports that she does not drink alcohol or use illicit drugs. Lives in a house that is mostly single story.  She uses a cane and a walker which she uses for ambulation.  Drives.    Allergies  Allergen Reactions  . Codeine Other (See Comments)    hallucinations  . Demerol Other (See Comments)     hallucinations  . Morphine And Related Other (See Comments)    unknown  . Other Other (See Comments)    Invan 7- pt became red all over  . Sulfate Other (See Comments)    unknown    Family History  Problem Relation Age of Onset  . Cancer Sister     pt unaware of what kind  . High blood pressure Mother   . Heart attack Father     Prior to Admission medications   Medication Sig Start Date End Date Taking? Authorizing Provider  amitriptyline (ELAVIL) 10 MG tablet Take 10 mg by mouth daily.  08/15/11  Yes Historical Provider, MD  aspirin 81 MG tablet Take 81 mg by mouth daily.    Yes Historical Provider, MD  Cholecalciferol (VITAMIN D-3) 5000 UNITS TABS Take 1 tablet by mouth daily.    Yes Historical Provider, MD  Fluticasone-Salmeterol (ADVAIR DISKUS) 250-50 MCG/DOSE AEPB Inhale 1 puff into the lungs every 12 (twelve) hours.    Yes Historical Provider, MD  losartan-hydrochlorothiazide (HYZAAR) 100-25 MG per tablet Take 1 tablet by mouth daily.    Yes Historical Provider, MD  Multiple Vitamin (MULTIVITAMIN) capsule Take 1 capsule by mouth daily.    Yes Historical Provider, MD  omeprazole (PRILOSEC) 20 MG capsule Take 20 mg by mouth 2 (two) times daily.  07/11/11  Yes Historical Provider, MD  oxybutynin (DITROPAN) 5 MG tablet Take 5 mg by mouth daily.  07/13/11  Yes Historical Provider, MD  pravastatin (PRAVACHOL) 40 MG tablet Take 40 mg by mouth daily.  07/11/11  Yes Historical Provider, MD  predniSONE (DELTASONE) 5 MG tablet Take 10 mg by mouth as directed.    Yes Historical Provider, MD  traMADol (ULTRAM) 50 MG tablet Take 50 mg by mouth 2 (two) times daily as needed. For coughing   Yes Historical Provider, MD   Physical Exam: Filed Vitals:   10/25/12 0951 10/25/12 1000 10/25/12 1014 10/25/12 1100  BP: 160/56 165/55 152/59 162/68  Pulse: 98 94 90 86  Temp:      TempSrc:      Resp: 18 21    Height:      Weight:      SpO2: 96% 96% 93% 96%    General: Caucasian female, no acute  distress, lying on stretcher in pain  Eyes: PERRL, anicteric, noninjected, EOMI, no nystagmus  ENT: nares clear, OP nonerythematous, no exudates or plaques. Dry MM  Neck: supple, possibly mild thyromegaly but difficult to discern given neck habitus  Lymph: No cervical, submandibular, or supraclavicular LAD  Cardiovascular: RRR, 2/6 systolic murmur at the left and right sternal borders, 2+ pulses,  Respiratory: CTAB, no increased work of breathing  Abdomen: NABS, soft, nondistended, nontender, no organomegaly  Skin: warm and dry, no rash.  Musculoskeletal: Normal tone and bulk, patient spontaneously moves all extremities. No LEE.  RLE splinted to the knee.  Psychiatric: A&Ox4, normal affect  Neurologic: III-XII intact, strength 5/5 in BUE and LLE. RLE not tested due to splint. Hip flexion of the right side 5/5. Sensation decreased to light touch from the level of the knee down on the left side and absent proprioception.    Labs on Admission:  Basic Metabolic Panel:  Lab 10/25/12 1610  NA 139  K 3.1*  CL 99  CO2 29  GLUCOSE 108*  BUN 22  CREATININE 1.06  CALCIUM 8.9  MG --  PHOS --   Liver Function Tests: No results found for this basename: AST:5,ALT:5,ALKPHOS:5,BILITOT:5,PROT:5,ALBUMIN:5 in the last 168 hours No results found for this basename: LIPASE:5,AMYLASE:5 in the last 168 hours No results found for this basename: AMMONIA:5 in the last 168 hours CBC:  Lab 10/25/12 0808  WBC 10.2  NEUTROABS 5.9  HGB 14.4  HCT 42.5  MCV 92.8  PLT 257   Cardiac Enzymes:  Lab 10/25/12 0821  CKTOTAL --  CKMB --  CKMBINDEX --  TROPONINI <0.30    BNP (last 3 results) No results found for this basename: PROBNP:3 in the last 8760 hours CBG: No results found for this basename: GLUCAP:5 in the last 168 hours  Radiological Exams on Admission: Dg Chest Portable 1 View  10/25/2012  *RADIOLOGY REPORT*  Clinical Data: Fall.  Preop.  PORTABLE CHEST - 1 VIEW  Comparison: 02/12/2012   Findings: Low lung volumes.  Normal heart size.  Clear lungs.  No pneumothorax.  No obvious acute bony deformity.  IMPRESSION: No active cardiopulmonary disease.   Original Report Authenticated By: Jolaine Click, M.D.    Dg Ankle Right Port  10/25/2012  *RADIOLOGY REPORT*  Clinical Data: Fracture.  Postreduction.  PORTABLE RIGHT ANKLE - 2 VIEW  Comparison: None.  Findings: Trimalleolar fracture again noted.  Alignment is significantly improved but not anatomic, with 9 mm of separation between the medial tibial plafond and the medial talus but only 2 mm of separation laterally, and 6 mm posterior displacement of the inferior fragment of the lateral malleolar fracture with respect to the superior.  No new fracture or specific complicating feature is observed.  A fiberglass splint noted.  IMPRESSION:  1.  Significantly improved but not anatomic alignment, with mild persistent medial displacement of the tibia with respect to the talus, and mild separation of the lateral malleolar oblique fracture site.  Interval fiberglass splinting.   Original Report Authenticated By: Gaylyn Rong, M.D.    Dg Ankle Right Port  10/25/2012  *RADIOLOGY REPORT*  Clinical Data: Ankle pain post fall  PORTABLE RIGHT ANKLE - 2 VIEW  Comparison: Portable exam 0820 hours without priors for comparison.  Findings: Fracture-dislocation right ankle: Lateral dislocation of talus from tibiotalar joint. Oblique distal fibular fracture displaced laterally with mild apex medial angulation. Fracture medial malleolus displaced laterally. Displaced posterior malleolar fracture fragment.  Tarsals grossly intact. Bones appear demineralized.  IMPRESSION: Trimalleolar fracture-dislocation right ankle.   Original Report Authenticated By: Ulyses Southward, M.D.     EKG: Independently reviewed.  NSR. Q-waves in the inferior leads and possible suggestion of old anterior MI. No acute ischemia. Unchanged from prior.   Assessment/Plan Principal Problem:   *Closed trimalleolar fracture of right ankle Active Problems:  Hyperlipidemia  Chronic bronchitis  GERD (gastroesophageal reflux disease)  Neuropathy  Hypertension  Balance disorder  Iatrogenic adrenal insufficiency  Trimalleolar fracture:  - Management per orthopedics  - Pain medication and muscle relaxants per orthopedics  - weight bearing status per  ortho - DVT prophylaxis per ortho  Frequent mechanical falls: MRI per patient reportedly normal making CVA and normal pressure hydrocephalus less likely. Peripheral neuropathy likely contributing to balance issues. Given lack of syncope, palpitations, LOC, heart arrhythmia less likely cause of falls.  - Obtain outpatient MRI report  - HIV (patient consented in presence of her husband), RPR, TSH, SPEP, B12, B1 levels  - D/C amitriptyline (patient on low dose so low risk of withdrawal symptoms)  - PT/OT for right foot fracture in conjunction with vestibular rehab which can be done again as an outpatient  HTN/HLD and possible CAD given Q-waves on ECG: Blood pressure and pulse elevated, likely due to severe pain. Denies chest pain and able to sustain 3-4 METs  - Control pain  - Hold diuretic perioperatively  - Start low dose beta blocker  - Continue statin perioperatively  - Patient moderate risk for moderate risk procedure  - Outpatient follow up with primary care doctor for further evaluation of CAD  Chronic bronchitis: Low lung volumes on CXR, on room air. Mild tachypnea from pain  - Continue advair  - Albuterol PRN  - Incentive spirometry  - Continue oral steroids as below  - Outpatient PFTs after discharge  Steroid dependence (likely iatrogenic adrenal insufficiency):  - Stress dose steroids perioperatively: Hydrocortisone 25 mg every 8h on date of surgery, then resume home prednisone dose  - Endocrinology follow up  GERD, stable. Continue PPI  Osteoporosis, may be due to chronic steroids and now with ankle fracture -  Vitamin D  level -  Needs calcium and vitamin D and follow up with PCP  Code Status:  Full code Family Communication:  Spoke with patient and husband Disposition Plan: pending medical stability  Time spent: 72  Renae Fickle [facility] Pager [phone]  If 7PM-7AM, please contact night-coverage [email or web address] Password [email or web address] 10/25/2012, 12:01 PM

## 2012-10-25 NOTE — ED Notes (Signed)
Per dr Bebe Shaggy we are waiting for dr handy to come see pt. Pt family informed.

## 2012-10-25 NOTE — ED Notes (Signed)
DR SHORT THE HOSPITALIST IN TO SEE PT.

## 2012-10-25 NOTE — Anesthesia Preprocedure Evaluation (Addendum)
Anesthesia Evaluation  Patient identified by MRN, date of birth, ID band Patient awake    Reviewed: Allergy & Precautions, H&P , NPO status , Patient's Chart, lab work & pertinent test results  History of Anesthesia Complications Negative for: history of anesthetic complications  Airway Mallampati: II TM Distance: >3 FB Neck ROM: Full    Dental  (+) Edentulous Upper, Partial Lower and Dental Advisory Given   Pulmonary  On steroids breath sounds clear to auscultation  Pulmonary exam normal       Cardiovascular hypertension, Pt. on medications Rhythm:Regular Rate:Normal     Neuro/Psych negative neurological ROS     GI/Hepatic Neg liver ROS, GERD-  Medicated and Controlled,  Endo/Other  Morbid obesity  Renal/GU negative Renal ROS     Musculoskeletal   Abdominal (+) + obese,   Peds  Hematology   Anesthesia Other Findings   Reproductive/Obstetrics                           Anesthesia Physical Anesthesia Plan  ASA: III  Anesthesia Plan: General   Post-op Pain Management:    Induction: Intravenous  Airway Management Planned: Oral ETT  Additional Equipment:   Intra-op Plan:   Post-operative Plan: Extubation in OR  Informed Consent: I have reviewed the patients History and Physical, chart, labs and discussed the procedure including the risks, benefits and alternatives for the proposed anesthesia with the patient or authorized representative who has indicated his/her understanding and acceptance.   Dental advisory given  Plan Discussed with: CRNA and Surgeon  Anesthesia Plan Comments: (Plan routine monitors, GETA)        Anesthesia Quick Evaluation

## 2012-10-25 NOTE — ED Notes (Signed)
Handy  MD paged for additional pain medication

## 2012-10-25 NOTE — Anesthesia Procedure Notes (Signed)
Procedure Name: Intubation Date/Time: 10/25/2012 8:53 PM Performed by: Angelica Pou Pre-anesthesia Checklist: Patient identified, Timeout performed, Emergency Drugs available, Suction available and Patient being monitored Patient Re-evaluated:Patient Re-evaluated prior to inductionOxygen Delivery Method: Circle system utilized Preoxygenation: Pre-oxygenation with 100% oxygen Intubation Type: IV induction Ventilation: Mask ventilation without difficulty and Oral airway inserted - appropriate to patient size Laryngoscope Size: Mac and 3 Grade View: Grade I Tube type: Oral Tube size: 7.5 mm Number of attempts: 1 Airway Equipment and Method: Stylet and Oral airway Placement Confirmation: ETT inserted through vocal cords under direct vision,  breath sounds checked- equal and bilateral and positive ETCO2 Secured at: 22 cm Tube secured with: Tape Dental Injury: Teeth and Oropharynx as per pre-operative assessment

## 2012-10-25 NOTE — Progress Notes (Signed)
Port x-ray done.

## 2012-10-25 NOTE — ED Notes (Signed)
Care transferred and report given to Annette, RN. 

## 2012-10-25 NOTE — ED Notes (Signed)
Ortho at bedside to rereduce pt ankle

## 2012-10-25 NOTE — Preoperative (Signed)
Beta Blockers   Reason not to administer Beta Blockers:Not Applicable 

## 2012-10-25 NOTE — ED Provider Notes (Addendum)
History     CSN: [id number]  Arrival date & time 10/25/12  4098   First MD Initiated Contact with Patient 10/25/12 [phone]      Chief Complaint  Patient presents with  . Fall  . Ankle Pain    Patient is a 77 y.o. female presenting with fall. The history is provided by the patient and the spouse.  Fall Incident onset: just prior to arrival. The fall occurred while walking. The pain is moderate. She was not ambulatory at the scene. Pertinent negatives include no fever, no abdominal pain, no vomiting, no headaches and no loss of consciousness. The symptoms are aggravated by activity. She has tried rest for the symptoms. The treatment provided moderate relief.  the symptoms are improved with rest Duration - several minutes  Pt report she was walking back from bathroom and she fell.  She reports she heard a pop and may have injured her right ankle.  No head injury.  No LOC.  No headache.  No cp/sob.  No neck or back pain.  No focal weakness.  She is not sure why she fell, she just reports "I lose my balance" but no proceeding cp/sob/dizziness.  She had otherwise been at her baseline prior to fall.    Past Medical History  Diagnosis Date  . Incontinence   . Arthritis   . Wears dentures   . Osteoporosis   . Bulging disc   . Hyperlipidemia   . Shortness of breath     due to congestion at times, on prednisone and advair  . GERD (gastroesophageal reflux disease)   . Neuropathy   . Depression   . Hypertension     followed by intern med Dr. Kendrick Fries [phone]  . Tubulovillous adenoma of rectum     Past Surgical History  Procedure Date  . Incontinence surgery   . Rectal surgery     by dr. Carolynne Edouard  . Appendectomy   . Abdominal hysterectomy   . Tonsillectomy   . Eye surgery     bilateral cataract surgery and lens implant  . Dilation and curettage of uterus   . Rectal biopsy 09/21/2011    Procedure: BIOPSY RECTAL;  Surgeon: Robyne Askew, MD;  Location: [facility] OR;  Service: General;   Laterality: N/A;  3-4 cm    Family History  Problem Relation Age of Onset  . Cancer Sister     pt unaware of what kind    History  Substance Use Topics  . Smoking status: Never Smoker   . Smokeless tobacco: Never Used  . Alcohol Use: No    OB History    Grav Para Term Preterm Abortions TAB SAB Ect Mult Living                  Review of Systems  Constitutional: Negative for fever.  Respiratory: Positive for cough. Negative for chest tightness and shortness of breath.   Cardiovascular: Negative for chest pain.  Gastrointestinal: Negative for vomiting and abdominal pain.  Musculoskeletal: Negative for back pain.  Skin: Positive for wound. Negative for color change.  Neurological: Negative for dizziness, loss of consciousness, weakness and headaches.  Psychiatric/Behavioral: Negative for agitation.  All other systems reviewed and are negative.    Allergies  Codeine; Demerol; Morphine and related; Other; and Sulfate  Home Medications   Current Outpatient Rx  Name  Route  Sig  Dispense  Refill  . AMITRIPTYLINE HCL 10 MG PO TABS   Oral   Take  10 mg by mouth daily.          . ASPIRIN 81 MG PO TABS   Oral   Take 81 mg by mouth daily.          Marland Kitchen VITAMIN D-3 5000 UNITS PO TABS   Oral   Take 1 tablet by mouth daily.          Marland Kitchen VITAMIN B-12 PO   Oral   Take 1 tablet by mouth daily.          Marland Kitchen FLUOXETINE HCL 40 MG PO CAPS   Oral   Take 40 mg by mouth daily.          Marland Kitchen FLUTICASONE-SALMETEROL 250-50 MCG/DOSE IN AEPB   Inhalation   Inhale 1 puff into the lungs every 12 (twelve) hours.          Marland Kitchen LOSARTAN POTASSIUM-HCTZ 100-25 MG PO TABS   Oral   Take 1 tablet by mouth daily.          . MULTIVITAMINS PO CAPS   Oral   Take 1 capsule by mouth daily.          Marland Kitchen OMEPRAZOLE 20 MG PO CPDR   Oral   Take 20 mg by mouth 2 (two) times daily.          . OXYBUTYNIN CHLORIDE 5 MG PO TABS   Oral   Take 5 mg by mouth daily.          Marland Kitchen PRAVASTATIN  SODIUM 40 MG PO TABS   Oral   Take 40 mg by mouth daily.          Marland Kitchen PREDNISONE 5 MG PO TABS   Oral   Take 5 mg by mouth as directed.          Marland Kitchen TRAMADOL HCL PO   Oral   Take 50 mg by mouth 3 (three) times daily as needed. For pain           BP 163/79  Pulse 97  Temp 98.5 F (36.9 C) (Oral)  Resp 18  Ht 5\' 4"  (1.626 m)  Wt 180 lb (81.647 kg)  BMI 30.90 kg/m2  SpO2 93%  Physical Exam CONSTITUTIONAL: Well developed/well nourished HEAD AND FACE: Normocephalic/atraumatic EYES: EOMI/PERRL ENMT: Mucous membranes moist, No evidence of facial/nasal trauma NECK: supple no meningeal signs SPINE:entire spine nontender, NEXUS criteria met, No bruising/crepitance/stepoffs noted to spine CV: S1/S2 noted, soft systolic murmur noted LUNGS: Lungs are clear to auscultation bilaterally, no apparent distress ABDOMEN: soft, nontender, no rebound or guarding GU:no cva tenderness NEURO: Pt is awake/alert, moves all extremitiesx4, GCS 15.  No distress EXTREMITIES: pulses equal on both feet.  She has obvious deformity to right ankle.  Small abrasion overlying deformity site but no bone is protruding and no active bleeding.  There is no right prox fib tenderness.  She has no tenderness with movement of right hip. All other extremities/joints palpated/ranged and nontender SKIN: warm, color normal PSYCH: no abnormalities of mood noted  ED Course  Reduction of fracture Performed by: Joya Gaskins Authorized by: Joya Gaskins Consent: Written consent obtained. Risks and benefits: risks, benefits and alternatives were discussed Consent given by: patient Patient identity confirmed: verbally with patient and arm band Time out: Immediately prior to procedure a "time out" was called to verify the correct patient, procedure, equipment, support staff and site/side marked as required. Patient sedated: yes Vitals: Vital signs were monitored during sedation. Patient tolerance: Patient  tolerated the  procedure well with no immediate complications. Comments: Closed reduction of right ankle trimalleolar fracture Pt tolerated well She was splinted immediately after reduction by ortho tech Distal pulses intact after reduction She is able to move her toes without difficulty     Procedural sedation Performed by: Joya Gaskins Consent: Verbal consent obtained. Risks and benefits: risks, benefits and alternatives were discussed Required items:  special equipment available Patient identity confirmed: arm band and provided demographic data Time out: Immediately prior to procedure a "time out" was called to verify the correct patient, procedure, equipment, support staff and site/side marked as required.  Sedation type: moderate (conscious) sedation NPO time confirmed and considedered  Sedatives: ETOMIDATE  Physician Time at Bedside: 17  Vitals: Vital signs were monitored during sedation. Cardiac Monitor, pulse oximeter Patient tolerance: Patient tolerated the procedure well with no immediate complications. Comments: Pt with uneventful recovered. Returned to pre-procedural sedation baseline    Labs Reviewed  BASIC METABOLIC PANEL  CBC WITH DIFFERENTIAL  TYPE AND SCREEN  TROPONIN I   8:37 AM Pt with fall of unclear etiology now with obvious fx/dislocation of right ankle.  She has no other injuries noted.  She has agreed to procedural sedation.  Will reduce ankle and call orthopedics 9:48 AM D/w dr handy.  He reviewed imaging and is aware of small abrasion but no protruding bone/bleeding Given unclear cause of fall, will consult internal medicine Pt stable, without complaints after reduction  MDM  Nursing notes including past medical history and social history reviewed and considered in documentation xrays reviewed and considered Labs/vital reviewed and considered        Date: 10/25/2012  Rate: 86  Rhythm: normal sinus rhythm  QRS Axis: left   Intervals: normal  ST/T Wave abnormalities: nonspecific ST changes  Conduction Disutrbances:none  Narrative Interpretation:   Old EKG Reviewed: unchanged    Joya Gaskins, MD 10/25/12 0949  12:18 PM Update - seen by medicine Awaiting dr handy consult   Joya Gaskins, MD 10/25/12 1218

## 2012-10-25 NOTE — Anesthesia Postprocedure Evaluation (Signed)
  Anesthesia Post-op Note  Patient: Marie [name]  Procedure(s) Performed: Procedure(s) (LRB) with comments: EXTERNAL FIXATION LEG (Right)  Patient Location: PACU  Anesthesia Type:General  Level of Consciousness: awake  Airway and Oxygen Therapy: Patient Spontanous Breathing  Post-op Pain: mild  Post-op Assessment: Post-op Vital signs reviewed  Post-op Vital Signs: stable  Complications: No apparent anesthesia complications

## 2012-10-25 NOTE — Brief Op Note (Signed)
10/25/2012  9:31 PM  PATIENT:  Marie [name]  77 y.o. female  PRE-OPERATIVE DIAGNOSIS:  right ankle trimalleolar fracture, ruptured syndesmosis  POST-OPERATIVE DIAGNOSIS:  right ankle trimalleolar fracture, ruptured syndesmosis  PROCEDURE:  Procedure(s) (LRB) with comments: EXTERNAL FIXATION LEG (Right) 2. ORIF trimalleolar fracture without fixation of posterior malleolus 3. ORIF of syndesmosis  SURGEON:  Surgeon(s) and Role:    * Budd Palmer, MD - Primary  PHYSICIAN ASSISTANT: Montez Morita, [facility]  ANESTHESIA:   general  EBL:  Total I/O In: 1000 [I.V.:1000] Out: 20 [Blood:20]  BLOOD ADMINISTERED:none  DRAINS: none   LOCAL MEDICATIONS USED:  NONE  SPECIMEN:  No Specimen  DISPOSITION OF SPECIMEN:  N/A  COUNTS:  YES  TOURNIQUET:  * No tourniquets in log *  DICTATION: .Other Dictation: Dictation Number [id number]  PLAN OF CARE: Admit to inpatient   PATIENT DISPOSITION:  PACU - hemodynamically stable.   Delay start of Pharmacological VTE agent (>24hrs) due to surgical blood loss or risk of bleeding: no

## 2012-10-25 NOTE — Progress Notes (Signed)
Pt is back on the unit. Stable and alert. Husband is with pt. Will continue to monitor

## 2012-10-26 ENCOUNTER — Encounter (HOSPITAL_COMMUNITY): Payer: Self-pay | Admitting: Orthopedic Surgery

## 2012-10-26 DIAGNOSIS — G589 Mononeuropathy, unspecified: Secondary | ICD-10-CM

## 2012-10-26 DIAGNOSIS — S82853A Displaced trimalleolar fracture of unspecified lower leg, initial encounter for closed fracture: Principal | ICD-10-CM

## 2012-10-26 LAB — CBC
HCT: 36.9 % (ref 36.0–46.0)
Hemoglobin: 12.1 g/dL (ref 12.0–15.0)
MCH: 31.1 pg (ref 26.0–34.0)
MCHC: 32.8 g/dL (ref 30.0–36.0)
MCV: 94.9 fL (ref 78.0–100.0)
RDW: 14.3 % (ref 11.5–15.5)

## 2012-10-26 LAB — BASIC METABOLIC PANEL
BUN: 17 mg/dL (ref 6–23)
CO2: 26 mEq/L (ref 19–32)
Chloride: 100 mEq/L (ref 96–112)
Creatinine, Ser: 0.97 mg/dL (ref 0.50–1.10)
Glucose, Bld: 172 mg/dL — ABNORMAL HIGH (ref 70–99)

## 2012-10-26 LAB — URINE MICROSCOPIC-ADD ON

## 2012-10-26 LAB — VITAMIN D 25 HYDROXY (VIT D DEFICIENCY, FRACTURES): Vit D, 25-Hydroxy: 56 ng/mL (ref 30–89)

## 2012-10-26 LAB — URINALYSIS, ROUTINE W REFLEX MICROSCOPIC
Bilirubin Urine: NEGATIVE
Protein, ur: NEGATIVE mg/dL
Urobilinogen, UA: 0.2 mg/dL (ref 0.0–1.0)

## 2012-10-26 LAB — TSH: TSH: 0.608 u[IU]/mL (ref 0.350–4.500)

## 2012-10-26 MED ORDER — HYDROCODONE-ACETAMINOPHEN 5-325 MG PO TABS
1.0000 | ORAL_TABLET | Freq: Four times a day (QID) | ORAL | Status: DC | PRN
  Administered 2012-10-26 – 2012-10-27 (×3): 2 via ORAL
  Filled 2012-10-26 (×4): qty 2

## 2012-10-26 MED ORDER — POTASSIUM CHLORIDE CRYS ER 20 MEQ PO TBCR
40.0000 meq | EXTENDED_RELEASE_TABLET | ORAL | Status: AC
  Administered 2012-10-26 (×2): 40 meq via ORAL
  Filled 2012-10-26 (×2): qty 2

## 2012-10-26 NOTE — Clinical Social Work Placement (Addendum)
     Clinical Social Work Department CLINICAL SOCIAL WORK PLACEMENT NOTE 10/28/2012  Patient:  Marie [name], Marie [name]  Account Number:  [id number] Admit date:  10/25/2012  Clinical Social Worker:  Robin Searing  Date/time:  10/26/2012 02:11 PM  Clinical Social Work is seeking post-discharge placement for this patient at the following level of care:   SKILLED NURSING   (*CSW will update this form in Epic as items are completed)   10/26/2012  Patient/family provided with [facility] Department of Clinical Social Works list of facilities offering this level of care within the geographic area requested by the patient (or if unable, by the patients family).  10/26/2012  Patient/family informed of their freedom to choose among providers that offer the needed level of care, that participate in Medicare, Medicaid or managed care program needed by the patient, have an available bed and are willing to accept the patient.  10/26/2012  Patient/family informed of [facility] ownership interest in [facility], as well as of the fact that they are under no obligation to receive care at this facility.  PASARR submitted to EDS on 10/26/2012 PASARR number received from EDS on 10/26/2012  FL2 transmitted to all facilities in geographic area requested by pt/family on  10/26/2012 FL2 transmitted to all facilities within larger geographic area on   Patient informed that his/her managed care company has contracts with or will negotiate with  certain facilities, including the following:     Patient/family informed of bed offers received:  10/27/2012 Patient chooses bed at [facility] [place] Physician recommends and patient chooses bed at    Patient to be transferred to [facility] [place] on  10/28/2012 Patient to be transferred to facility by EMS  The following physician request were entered in Epic:   Additional Comments:

## 2012-10-26 NOTE — Clinical Social Work Psychosocial (Signed)
     Clinical Social Work Department BRIEF PSYCHOSOCIAL ASSESSMENT 10/26/2012  Patient:  Marie [name], Marie [name]     Account Number:  [id number]     Admit date:  10/25/2012  Clinical Social Worker:  Robin Searing  Date/Time:  10/26/2012 11:50 AM  Referred by:  Physician  Date Referred:  10/26/2012 Referred for  SNF Placement   Other Referral:   Interview type:  Patient Other interview type:   husband at bedside.    PSYCHOSOCIAL DATA Living Status:  FAMILY Admitted from facility:   Level of care:   Primary support name:  husband Primary support relationship to patient:  FAMILY Degree of support available:   good    CURRENT CONCERNS Current Concerns  Post-Acute Placement   Other Concerns:    SOCIAL WORK ASSESSMENT / PLAN met with patient and husband briefly to discuss possible SNF.  They are hoping for CIR but understnad and agree SNF may be needed.  They reside in [place] and prefer that area for SNF if not able to go to SNF>   Assessment/plan status:  Other - See comment Other assessment/ plan:   FL2 and PASARR completion for ?SNF   Information/referral to community resources:   SNF  EMS    PATIENTS/FAMILYS RESPONSE TO PLAN OF CARE: Patient and spouse agree to plans for CIR-vs- SNF. Will proceed with SNF search and f/u

## 2012-10-26 NOTE — Evaluation (Signed)
Occupational Therapy Evaluation Patient Details Name: Marie [name] MRN: [id number] DOB: 1935-12-18 Today's Date: 10/26/2012 Time: 4098-1191 OT Time Calculation (min): 33 min  OT Assessment / Plan / Recommendation Clinical Impression  This 77 yo female s/p fall with ankle fracture with ORIF and exteranl fixator presents to acute OT with problems below. Will benefit from acute OT with follow up OT on CIR.    OT Assessment  Patient needs continued OT Services    Follow Up Recommendations  CIR    Barriers to Discharge None    Equipment Recommendations  None recommended by OT    Recommendations for Other Services Rehab consult  Frequency  Min 2X/week    Precautions / Restrictions Precautions Precautions: Fall Precaution Comments: external fixator Restrictions Weight Bearing Restrictions: Yes RLE Weight Bearing: Non weight bearing       ADL  Eating/Feeding: Performed;Independent Where Assessed - Eating/Feeding: Chair Grooming: Simulated;Set up Where Assessed - Grooming: Unsupported sitting Upper Body Bathing: Simulated;Set up Where Assessed - Upper Body Bathing: Unsupported sitting Lower Body Bathing: Simulated;Maximal assistance Where Assessed - Lower Body Bathing: Supported sit to stand Upper Body Dressing: Simulated;Set up Where Assessed - Upper Body Dressing: Unsupported sitting Lower Body Dressing: Performed;+2 Total assistance Lower Body Dressing: Patient Percentage: 20% Where Assessed - Lower Body Dressing: Supported sit to stand Toilet Transfer: Simulated;Moderate assistance (with additional +1 for safety) Toilet Transfer Method: Squat pivot Toilet Transfer Equipment:  (Bed to recliner going to pt's left) Toileting - Clothing Manipulation and Hygiene: Performed;+1 Total assistance (With another to help stand) Toileting - Clothing Manipulation and Hygiene: Patient Percentage: 20% Where Assessed - Toileting Clothing Manipulation and Hygiene: Standing Equipment  Used: Rolling walker;Gait belt Transfers/Ambulation Related to ADLs: Mod A for sit to stand and stand to sit    OT Diagnosis: Generalized weakness  OT Problem List: Impaired balance (sitting and/or standing);Decreased knowledge of use of DME or AE OT Treatment Interventions: Self-care/ADL training;DME and/or AE instruction;Patient/family education;Balance training   OT Goals Acute Rehab OT Goals OT Goal Formulation: With patient Time For Goal Achievement: 11/09/12 Potential to Achieve Goals: Good ADL Goals Pt Will Perform Lower Body Dressing: with min assist;Supported;with adaptive equipment;Sit to stand from bed;Sit to stand from chair ADL Goal: Lower Body Dressing - Progress: Goal set today Pt Will Transfer to Toilet: with min assist;3-in-1;Squat pivot transfer ADL Goal: Toilet Transfer - Progress: Goal set today Pt Will Perform Toileting - Clothing Manipulation: with min assist;Rolling right and/or left ADL Goal: Toileting - Clothing Manipulation - Progress: Goal set today Pt Will Perform Toileting - Hygiene: with min assist;Leaning right and/or left on 3-in-1/toilet ADL Goal: Toileting - Hygiene - Progress: Goal set today Miscellaneous OT Goals Miscellaneous OT Goal #1: Pt will be Supervision in and OOB for BADLs. OT Goal: Miscellaneous Goal #1 - Progress: Goal set today Miscellaneous OT Goal #2: Pt will be able maintain standing with Min guard A in prep for LB ADLs at a sit to stand level.  Visit Information  Last OT Received On: 10/26/12 Assistance Needed: +2 PT/OT Co-Evaluation/Treatment: Yes    Subjective Data  Subjective: I need to put a Depends on  Patient Stated Goal: Go to rehab short term   Prior Functioning     Home Living Lives With: Spouse Available Help at Discharge: Family (most of time) Type of Home: House Home Access: Ramped entrance Home Layout: One level Bathroom Shower/Tub: Health visitor: Standard Home Adaptive Equipment: Bedside  commode/3-in-1;Walker - rolling;Shower chair with back;Wheelchair - manual  Prior Function Level of Independence: Independent with assistive device(s) (using RW) Able to Take Stairs?: Yes Vocation: Retired Musician: No difficulties Dominant Hand: Right         Vision/Perception Vision - Assessment Additional Comments: None   Cognition  Overall Cognitive Status: Appears within functional limits for tasks assessed/performed Arousal/Alertness: Awake/alert Orientation Level: Appears intact for tasks assessed Behavior During Session: [facility] for tasks performed    Extremity/Trunk Assessment Right Upper Extremity Assessment RUE ROM/Strength/Tone: Within functional levels Left Upper Extremity Assessment LUE ROM/Strength/Tone: Within functional levels     Mobility Bed Mobility Bed Mobility: Supine to Sit;Sitting - Scoot to Edge of Bed Supine to Sit: 1: +2 Total assist;HOB flat Supine to Sit: Patient Percentage: 70% Sitting - Scoot to Edge of Bed: 4: Min assist Transfers Transfers: Sit to Stand;Stand to Sit Sit to Stand: 3: Mod assist;With upper extremity assist;From bed Stand to Sit: 3: Mod assist;With upper extremity assist;With armrests;To chair/3-in-1 Details for Transfer Assistance: Vc's needed for safe hand placement              End of Session OT - End of Session Equipment Utilized During Treatment: Gait belt Activity Tolerance: Patient tolerated treatment well Patient left: in chair;with call bell/phone within reach;with family/visitor present       Evette Georges [phone] 10/26/2012, 10:02 AM

## 2012-10-26 NOTE — Evaluation (Signed)
Physical Therapy Evaluation Patient Details Name: Marie [name] MRN: [id number] DOB: [birth year] Today's Date: 10/26/2012 Time: 4098-1191 PT Time Calculation (min): 27 min  PT Assessment / Plan / Recommendation Clinical Impression  Pt adm with rt ankle fx and underwent external fixator and ORIF.  Pt needs skilled PT to maximize I and safety so pt can eventually return home with husband.  Pt with excellent tolerance of activity and motivated.  Feel pt could benefit from CIR.    PT Assessment  Patient needs continued PT services    Follow Up Recommendations  CIR    Does the patient have the potential to tolerate intense rehabilitation      Barriers to Discharge        Equipment Recommendations  None recommended by PT    Recommendations for Other Services     Frequency Min 5X/week    Precautions / Restrictions Precautions Precautions: Fall Precaution Comments: external fixator Restrictions Weight Bearing Restrictions: Yes RLE Weight Bearing: Non weight bearing   Pertinent Vitals/Pain Pt reports no pain currently but did have pain last night.      Mobility  Bed Mobility Bed Mobility: Supine to Sit;Sitting - Scoot to Edge of Bed Supine to Sit: 1: +2 Total assist;HOB flat Supine to Sit: Patient Percentage: 70% Sitting - Scoot to Edge of Bed: 4: Min assist Details for Bed Mobility Assistance: verbal cues for technique Transfers Sit to Stand: 3: Mod assist;With upper extremity assist;From bed Stand to Sit: 3: Mod assist;With upper extremity assist;With armrests;To chair/3-in-1 Details for Transfer Assistance: Vc's needed for safe hand placement    Shoulder Instructions     Exercises     PT Diagnosis: Difficulty walking  PT Problem List: Decreased strength;Decreased mobility;Decreased knowledge of use of DME;Decreased knowledge of precautions;Pain;Decreased balance PT Treatment Interventions: DME instruction;Functional mobility training;Patient/family  education;Therapeutic activities;Therapeutic exercise;Balance training   PT Goals Acute Rehab PT Goals PT Goal Formulation: With patient Time For Goal Achievement: 11/02/12 Potential to Achieve Goals: Good Pt will go Supine/Side to Sit: with modified independence PT Goal: Supine/Side to Sit - Progress: Goal set today Pt will go Sit to Supine/Side: with modified independence PT Goal: Sit to Supine/Side - Progress: Goal set today Pt will go Sit to Stand: with supervision PT Goal: Sit to Stand - Progress: Goal set today Pt will go Stand to Sit: with supervision PT Goal: Stand to Sit - Progress: Goal set today Pt will Transfer Bed to Chair/Chair to Bed: with supervision PT Transfer Goal: Bed to Chair/Chair to Bed - Progress: Goal set today Pt will Stand: with supervision;with bilateral upper extremity support;1 - 2 min PT Goal: Stand - Progress: Goal set today  Visit Information  Last PT Received On: 10/26/12 Assistance Needed:  (for safety) PT/OT Co-Evaluation/Treatment: Yes    Subjective Data  Subjective: Pt states she was hurting last night but isn't this morning. Patient Stated Goal: Return home   Prior Functioning  Home Living Lives With: Spouse Available Help at Discharge: Family (most of time) Type of Home: House Home Access: Ramped entrance Home Layout: One level Bathroom Shower/Tub: Health visitor: Standard Home Adaptive Equipment: Bedside commode/3-in-1;Walker - rolling;Shower chair with back;Wheelchair - manual Prior Function Level of Independence: Independent with assistive device(s) (using RW) Able to Take Stairs?: Yes Vocation: Retired Musician: No difficulties Dominant Hand: Right    Cognition  Overall Cognitive Status: Appears within functional limits for tasks assessed/performed Arousal/Alertness: Awake/alert Orientation Level: Appears intact for tasks assessed Behavior During Session: [facility]  for tasks performed      Extremity/Trunk Assessment Right Upper Extremity Assessment RUE ROM/Strength/Tone: Within functional levels Left Upper Extremity Assessment LUE ROM/Strength/Tone: Within functional levels Right Lower Extremity Assessment RLE ROM/Strength/Tone: Unable to fully assess (due to external fix.  Able to move hip/knee against gravity) Left Lower Extremity Assessment LLE ROM/Strength/Tone: Deficits LLE ROM/Strength/Tone Deficits: grossly 4/5   Balance Static Sitting Balance Static Sitting - Balance Support: No upper extremity supported Static Sitting - Level of Assistance: 7: Independent Static Standing Balance Static Standing - Balance Support: Bilateral upper extremity supported (on walker) Static Standing - Level of Assistance: 4: Min assist  End of Session PT - End of Session Equipment Utilized During Treatment: Gait belt Activity Tolerance: Patient tolerated treatment well Patient left: in chair;with call bell/phone within reach;with family/visitor present Nurse Communication: Mobility status;Precautions;Weight bearing status  GP     Marie [name] 10/26/2012, 10:27 AM  [employer] PT [phone]

## 2012-10-26 NOTE — Progress Notes (Signed)
[facility] (OTS)  Subjective: 1 Day Post-Op Procedure(s) (LRB): EXTERNAL FIXATION LEG (Right)  Pt doing very well this am Pain well controlled and much better than yesterday Denies CP, no SOB No palpitations No n/v No h/a or lightheadedness  Pt did receive stress dose of steroid in the OR hydrocortisone 25 mg, ordered hydrocortisone 25 mg q 8 h x 2 more does post op and ordered home steroid to resume on 10/27/2012  Objective: Current Vitals Blood pressure 135/57, pulse 82, temperature 98.4 F (36.9 C), temperature source Oral, resp. rate 20, height 5\' 4"  (1.626 m), weight 89.1 kg (196 lb 6.9 oz), SpO2 96.00%. Vital signs in last 24 hours: Temp:  [97 F (36.1 C)-99.2 F (37.3 C)] 98.4 F (36.9 C) (01/08 0610) Pulse Rate:  [76-98] 82  (01/08 0610) Resp:  [15-21] 20  (01/08 0610) BP: (113-165)/(42-68) 135/57 mmHg (01/08 0610) SpO2:  [92 %-100 %] 96 % (01/08 0610) Weight:  [89.1 kg (196 lb 6.9 oz)-89.2 kg (196 lb 10.4 oz)] 89.1 kg (196 lb 6.9 oz) (01/08 0500)  Intake/Output from previous day: 01/07 0701 - 01/08 0700 In: 1400 [I.V.:1400] Out: 20 [Blood:20] Intake/Output      01/07 0701 - 01/08 0700 01/08 0701 - 01/09 0700   I.V. (mL/kg) 1400 (15.7)    Total Intake(mL/kg) 1400 (15.7)    Blood 20    Total Output 20    Net +1380            LABS  Basename 10/26/12 0640 10/25/12 0808  HGB 12.1 14.4    Basename 10/26/12 0640 10/25/12 0808  WBC 10.4 10.2  RBC 3.89 4.58  HCT 36.9 42.5  PLT 227 257    Basename 10/26/12 0640 10/25/12 0808  NA 138 139  K 3.1* 3.1*  CL 100 99  CO2 26 29  BUN 17 22  CREATININE 0.97 1.06  GLUCOSE 172* 108*  CALCIUM 8.3* 8.9   No results found for this basename: LABPT:2,INR:2 in the last 72 hours  Results for Marie [name] (MRN [id number]) as of 10/26/2012 09:36  Ref. Range 10/25/2012 13:29  TSH Latest Range: 0.350-4.500 uIU/mL 3.362   Other labs pending  Physical Exam  Gen: awake and alert, appears very  comfortable Lungs: clear Cardiac: s1 and s2 Abd: soft, + BS Ext:  Right Lower Extremity   Ex fix intact   pinsites stable and look good   Dressing stable and intact   Expected drainage noted, particularly along calcaneal pin   Distal motor and sensory functions at baseline   Ext warm   + DP pulse    Assessment/Plan: 1 Day Post-Op Procedure(s) (LRB): EXTERNAL FIXATION LEG (Right)  77 y/o female s/p fall  1. Fall  Medicine initiating work up  Google pending    As outpatient pt may benefit from a program called balance for life, which we can provide contact info on during f/u  2. R trimall ankle fracture dislocation s/p ex fix and percutaneous fixation  NWB x 8 weeks  Ex fix will remain on for approximately 6 weeks  Ex fix and limited percutaneous fixation will serve as definitive treatment   Toe and knee motion as tolerated    Continue with therapies    Pt was working with therapy at the time of eval and did very will with pivot transfer to chair   Pt has a lot of equipment already set up at home, including a ramp and walker. Pt may benefit from short term stay  at inpatient rehab as no further surgery is planned other than to remove ex fix, but ex fix can also be removed in the office   3. Metabolic bone disease  Vitamin D pending  Continue with supplementation  Again chronic disease and chronic systemic steroid use likely contributing to poor bone density  Pt should have a DEXA scan performed as an outpt if she has not had one in the last 2 years, we can arrange for this during f/u  4. Chronic Bronchitis with chronic steroid use  Pt to receive 1 more stress dose of hydrocortisone, then resume home med 5. Pain  Pt with minimal pain  Continue with IV tylenol until it expires, then low dose narcotic (norco 5/325 1-2 po q 6 h prn) and po tylenol 6. DVT/PE prophylaxis  lovenox x 21 days post op (day 1/21) 7. Med issues  Continue per primary team 8. Dispo  CIR  consult  SW already consulted to look for SNF in the event pt not CIR candidate  Marie Latin, PA-C [facility] [phone] (P) 10/26/2012, 9:34 AM

## 2012-10-26 NOTE — Progress Notes (Signed)
[facility] PROGRESS NOTE  Marie [name] ZOX:[id number] DOB: December 10, 1935 DOA: 10/25/2012 PCP: Nadean Corwin, MD  Assessment/Plan: Trimalleolar fracture:  - Management per orthopedics  - Pain medication and muscle relaxants per orthopedics, good control today  - weight bearing status per ortho  - DVT prophylaxis per ortho   Frequent mechanical falls: MRI per patient reportedly normal making CVA and normal pressure hydrocephalus less likely. Peripheral neuropathy likely contributing to balance issues. Given lack of syncope, palpitations, LOC, heart arrhythmia less likely cause of falls.  -  HIV (patient consented in presence of her husband), RPR, TSH, SPEP, B12, B1 levels all pending this am -  PT/OT for right foot fracture in conjunction with vestibular rehab if needed which can be done again as an outpatient  -being considered for CIR   HTN/HLD and possible CAD given Q-waves on ECG: Blood pressure and pulse elevated initially, likely due to severe pain. Denies chest pain. Good control today. continue to control pain . Continue to hold diuretic. - continue low dose beta blocker  - Continue statin  - Outpatient follow up with primary care doctor for further evaluation of CAD   Chronic bronchitis: Low lung volumes on CXR, on room air.   - Continue advair  - Albuterol PRN  - Incentive spirometry  - Continue oral steroids.  - Outpatient PFTs after discharge   Steroid dependence (likely iatrogenic adrenal insufficiency):  - Stress dose steroids perioperatively: Hydrocortisone 25 mg every 8h on date of surgery, and for 2 doses afterwards and resume home prednisone dose on 10/27/12 per ortho recommendation - Endocrinology follow up OP  GERD, stable. Continue PPI : diet advanced to bland today  Osteoporosis, may be due to chronic steroids and now with ankle fracture  - Vitamin D level  - calcium and vitamin D levels pending. Will need OP follow up with PCP   Code Status:  full Family Communication: husband at bedside Disposition Plan: CIR or SNF for rehab and then ultimately home with husband   Consultants:  ortho  Procedures:  ORIF rt leg 10/25/12  Antibiotics:  Ancef 10/25/12>>>10/27/12  HPI/Subjective: Awake alert. Up in chair. Reports fair to good pain control NAD  Objective: Filed Vitals:   10/25/12 2300 10/26/12 0500 10/26/12 0610 10/26/12 1000  BP: 125/54  135/57   Pulse: 84  82   Temp: 99.2 F (37.3 C)  98.4 F (36.9 C)   TempSrc: Oral  Oral   Resp: 18  20   Height: 5\' 4"  (1.626 m)     Weight: 89.2 kg (196 lb 10.4 oz) 89.1 kg (196 lb 6.9 oz)    SpO2: 96%  96% 97%    Intake/Output Summary (Last 24 hours) at 10/26/12 1026 Last data filed at 10/25/12 2144  Gross per 24 hour  Intake   1400 ml  Output     20 ml  Net   1380 ml   Filed Weights   10/25/12 0746 10/25/12 2300 10/26/12 0500  Weight: 81.647 kg (180 lb) 89.2 kg (196 lb 10.4 oz) 89.1 kg (196 lb 6.9 oz)    Exam:   General:  Awake alert oriented x3  Cardiovascular: RRR +murmur No gallup rub  Respiratory: normal effort BSCTAB No wheeze  Abdomen: soft +BS non-tender to palpation  Data Reviewed: Basic Metabolic Panel:  Lab 10/26/12 8119 10/25/12 0808  NA 138 139  K 3.1* 3.1*  CL 100 99  CO2 26 29  GLUCOSE 172* 108*  BUN 17 22  CREATININE 0.97  1.06  CALCIUM 8.3* 8.9  MG -- --  PHOS -- --   Liver Function Tests: No results found for this basename: AST:5,ALT:5,ALKPHOS:5,BILITOT:5,PROT:5,ALBUMIN:5 in the last 168 hours No results found for this basename: LIPASE:5,AMYLASE:5 in the last 168 hours No results found for this basename: AMMONIA:5 in the last 168 hours CBC:  Lab 10/26/12 0640 2012-11-15 0808  WBC 10.4 10.2  NEUTROABS -- 5.9  HGB 12.1 14.4  HCT 36.9 42.5  MCV 94.9 92.8  PLT 227 257   Cardiac Enzymes:  Lab 11/15/12 0821  CKTOTAL --  CKMB --  CKMBINDEX --  TROPONINI <0.30   BNP (last 3 results) No results found for this basename: PROBNP:3  in the last 8760 hours CBG: No results found for this basename: GLUCAP:5 in the last 168 hours  Recent Results (from the past 240 hour(s))  SURGICAL PCR SCREEN     Status: Normal   Collection Time   15-Nov-2012  5:01 PM      Component Value Range Status Comment   MRSA, PCR NEGATIVE  NEGATIVE Final    Staphylococcus aureus NEGATIVE  NEGATIVE Final      Studies: Dg Ankle 2 Views Right  2012-11-15  *RADIOLOGY REPORT*  Clinical Data: Post reduction  RIGHT ANKLE - 2 VIEW  Comparison: 904 this morning  Findings: Detail obscured by cast.  Oblique fracture distal fibula appears unchanged and remains mildly displaced.  There is reduced displacement of medial malleolar fracture.  There is decreased widening of the mortise medially.  There remains a displaced posterior malleolar fracture with minimal decrease in this placement when compared to prior study.  IMPRESSION: Trimalleolar fracture with significant displacement, mildly decreased displacement of some fragments when compared to prior study.   Original Report Authenticated By: Esperanza Heir, M.D.    Dg Ankle Complete Right  Nov 15, 2012  *RADIOLOGY REPORT*  Clinical Data: Fracture fixation.  DG C-ARM 61-120 MIN, RIGHT ANKLE - COMPLETE 3+ VIEW  Technique: Four fluoroscopic spot views of the right ankle are provided.  Comparison:  CT scan and plain films 2012-11-15.  Findings: Four fluoroscopic spot views are provided and demonstrate placement of syndesmotic screw and screw in the medial malleolus. External fixator placement is also identified.  IMPRESSION: ORIF ankle fracture and external fixator placement as above.   Original Report Authenticated By: Holley Dexter, M.D.    Ct Ankle Right Wo Contrast  11/15/12  *RADIOLOGY REPORT*  Clinical Data: Fracture dislocation of the right ankle.  CT OF THE RIGHT ANKLE WITH CONTRAST  Technique:  Multidetector CT imaging was performed following the standard protocol during bolus administration of intravenous contrast.   Comparison: Radiographs dated 11/15/12  Findings: The fracture dislocation has been reduced.  Posterior malleolar fracture involves only a small segment of the articular surface of the distal tibia. There is approximately 5 mm of distraction and 4 mm of overriding.  There is 6 mm of distraction of the horizontal fracture through the medial malleolus.  The distal tibiofibular syndesmosis is disrupted.  There is an avulsion of bone from the lateral aspect of the distal tibia at the insertion of the syndesmosis and this fragment is displaced laterally between a fragment of the distal fibula and the site of origin of this avulsion.  In addition, the tibial attachment of the syndesmosis is avulsed with its osseous insertion and is displaced anterior to its normal position.  There is a small avulsion of bone from the proximal dorsal aspect of the cuboid adjacent to the anterior process of  the calcaneus.  There is no evidence of entrapped tendons at the ankle joint. Talus is intact.  Calcaneus is intact.  IMPRESSION:  1.  Disruption of the distal tibial fibular syndesmosis.  The tibial insertion of the syndesmosis is avulsed with a bone fragment which is trapped between a distal fibular fragment and the origin of the avulsed tibial fragment.  This is best seen on image number 16 of series 2.  There is also subluxation anteriorly of the fibular fragment with respect to the normal position at the syndesmosis. 2.  Slightly distracted medial and posterior malleolar fractures of the distal tibia as described. 3.  Small avulsion fracture of the dorsal proximal aspect of the cuboid.   Original Report Authenticated By: Francene Boyers, M.D.    Ct Foot Right Wo Contrast  10/25/2012  *RADIOLOGY REPORT*  Clinical Data: Fracture dislocation of the right ankle.  CT OF THE RIGHT FOOT WITHOUT CONTRAST  Technique:  Multidetector CT imaging was performed according to the standard protocol. Multiplanar CT image reconstructions were also  generated.  Comparison: Radiographs dated 10/25/2012  Findings: There is a small avulsion fracture of the dorsal proximal aspect of the cuboid adjacent to the anterior process of the calcaneus.  The other bones of the foot are intact.  Ankle fractures are described on the CT scan of the ankle of the same date.  Slight arthritis of the head of the first metatarsal.  IMPRESSION: Small avulsion from the dorsal proximal aspect of the cuboid adjacent to the anterior process of the calcaneus.   Original Report Authenticated By: Francene Boyers, M.D.    Dg Chest Portable 1 View  10/25/2012  *RADIOLOGY REPORT*  Clinical Data: Fall.  Preop.  PORTABLE CHEST - 1 VIEW  Comparison: 02/12/2012  Findings: Low lung volumes.  Normal heart size.  Clear lungs.  No pneumothorax.  No obvious acute bony deformity.  IMPRESSION: No active cardiopulmonary disease.   Original Report Authenticated By: Jolaine Click, M.D.    Dg Ankle Right Port  10/25/2012  *RADIOLOGY REPORT*  Clinical Data: Status post fracture fixation.  PORTABLE RIGHT ANKLE - 2 VIEW  Comparison: CT and plain films earlier this same date.  Findings: A single lag screw is seen in the medial malleolus. Syndesmotic screw is also in place.  Distal fibular fracture is again identified.  The patient is in a external fixator. The tibiotalar joint has been reduced.  IMPRESSION: ORIF ankle fractures and placement of an external fixator.  No acute finding.   Original Report Authenticated By: Holley Dexter, M.D.    Dg Ankle Right Port  10/25/2012  *RADIOLOGY REPORT*  Clinical Data: Fracture.  Postreduction.  PORTABLE RIGHT ANKLE - 2 VIEW  Comparison: None.  Findings: Trimalleolar fracture again noted.  Alignment is significantly improved but not anatomic, with 9 mm of separation between the medial tibial plafond and the medial talus but only 2 mm of separation laterally, and 6 mm posterior displacement of the inferior fragment of the lateral malleolar fracture with respect to the  superior.  No new fracture or specific complicating feature is observed.  A fiberglass splint noted.  IMPRESSION:  1.  Significantly improved but not anatomic alignment, with mild persistent medial displacement of the tibia with respect to the talus, and mild separation of the lateral malleolar oblique fracture site.  Interval fiberglass splinting.   Original Report Authenticated By: Gaylyn Rong, M.D.    Dg Ankle Right Port  10/25/2012  *RADIOLOGY REPORT*  Clinical Data: Ankle pain post  fall  PORTABLE RIGHT ANKLE - 2 VIEW  Comparison: Portable exam 0820 hours without priors for comparison.  Findings: Fracture-dislocation right ankle: Lateral dislocation of talus from tibiotalar joint. Oblique distal fibular fracture displaced laterally with mild apex medial angulation. Fracture medial malleolus displaced laterally. Displaced posterior malleolar fracture fragment.  Tarsals grossly intact. Bones appear demineralized.  IMPRESSION: Trimalleolar fracture-dislocation right ankle.   Original Report Authenticated By: Ulyses Southward, M.D.    Dg C-arm [phone] Min  10/25/2012  *RADIOLOGY REPORT*  Clinical Data: Fracture fixation.  DG C-ARM 61-120 MIN, RIGHT ANKLE - COMPLETE 3+ VIEW  Technique: Four fluoroscopic spot views of the right ankle are provided.  Comparison:  CT scan and plain films 10/25/12.  Findings: Four fluoroscopic spot views are provided and demonstrate placement of syndesmotic screw and screw in the medial malleolus. External fixator placement is also identified.  IMPRESSION: ORIF ankle fracture and external fixator placement as above.   Original Report Authenticated By: Holley Dexter, M.D.     Scheduled Meds:   . acetaminophen  1,000 mg Intravenous Q6H  .  ceFAZolin (ANCEF) IV  1 g Intravenous Q8H  . cholecalciferol  5,000 Units Oral Daily  . docusate sodium  100 mg Oral BID  . enoxaparin (LOVENOX) injection  40 mg Subcutaneous Q24H  . hydrocortisone sod succinate (SOLU-CORTEF) injection  25  mg Intravenous Q8H  . losartan  100 mg Oral Daily  . metoprolol tartrate  12.5 mg Oral BID  . mometasone-formoterol  2 puff Inhalation BID  . multivitamin with minerals  1 tablet Oral Daily  . oxybutynin  5 mg Oral Daily  . pantoprazole  40 mg Oral BID AC  . polyethylene glycol  17 g Oral Daily  . potassium chloride  40 mEq Oral Q4H  . predniSONE  10 mg Oral Daily  . senna  1 tablet Oral BID  . simvastatin  20 mg Oral q1800  . sodium chloride  3 mL Intravenous Q12H   Continuous Infusions:   . sodium chloride 75 mL/hr at 10/25/12 2325  . lactated ringers 50 mL/hr at 10/25/12 1731    Principal Problem:  *Closed trimalleolar fracture of right ankle Active Problems:  Hyperlipidemia  Chronic bronchitis  GERD (gastroesophageal reflux disease)  Neuropathy  Hypertension  Balance disorder  Iatrogenic adrenal insufficiency  Frequent falls    Time spent: 30 minutes    [facility] M  [facility]  If 8PM-8AM, please contact night-coverage at [email or web address], password [facility] 10/26/2012, 10:26 AM  LOS: 1 day

## 2012-10-26 NOTE — Op Note (Signed)
NAMESUBRENA, DEVEREUX NO.:  [id number]  MEDICAL RECORD NO.:  [id number]  LOCATION:                                 FACILITY:  PHYSICIAN:  Doralee Albino. Carola Frost, M.D. DATE OF BIRTH:  10/21/1935  DATE OF PROCEDURE:  10/25/2012 DATE OF DISCHARGE:                              OPERATIVE REPORT   PREOPERATIVE DIAGNOSES: 1. Right ankle trimalleolar fracture. 2. Ruptured syndesmosis. 3. Status post reduction of dislocation.  POSTOPERATIVE DIAGNOSES: 1. Right ankle trimalleolar fracture. 2. Ruptured syndesmosis. 3. Status post reduction of dislocation.  PROCEDURES: 1. ORIF of trimalleolar fracture without fixation of the posterior     malleolus. 2. ORIF of syndesmosis. 3. Spanning external fixation of the right ankle.  SURGEON:  Doralee Albino. Carola Frost, MD  ASSISTANT:  [name], PAC.  ANESTHESIA:  General.  COMPLICATIONS:  None.  TOURNIQUET:  None.  ESTIMATED BLOOD LOSS:  Minimal.  IV FLUIDS:  1000 mL crystalloid.  DISPOSITION:  PACU.  CONDITION:  Stable.  BRIEF SUMMARY OF INDICATION FOR PROCEDURE:  Marie [name] is a 77 year old female, with a past medical history notable for poor balance and multiple falls who sustained a fracture and dislocation of the ankle. She was treated with emergent reduction by the ED physician upon arrival and initial films.  However x-rays demonstrated at least 50% translation of the talus outside of the tibiotalar joint, and she then underwent a 2nd closed reduction of her dislocation by myself and Mr. Renae Fickle.  This was administered with a ankle block using lidocaine.  Subsequent CT scan demonstrated that the trimalleolar fracture did not involve a significant portion of the articular surface, and confirmed the known disruption of the syndesmosis.  There were no fracture fragments within the joint, but some within the fracture site of the posterior malleolus above the articular surface.  I did discuss with the patient and  her husband the risks and benefits of surgical treatment including the indications for same restore stability.  I am quite concerned that she already developed a large eschar over the area of pressure and was developing fracture blisters at the time of her 2nd reduction.  She understood the need for continued soft tissue care and that this could be facilitated with external fixation.  After a full discussion of these risks including heart attack, stroke, allergic reaction, infection, neurovascular injury, malunion, nonunion, loss of motion, arthritis, need for further surgery, multiple others, I did wish to proceed.  DESCRIPTION OF PROCEDURE:  Ms. Dusenbury received preoperative antibiotics, taken to the operating room where general anesthesia was induced.  Her right lower extremity was prepped and draped in usual sterile fashion while holding the reduction throughout.  A external fixator was then applied using a clamp anteriorly.  A transfixion pin into the calcaneus and pinned into the 1st and 5th metatarsal.  The pins were adjusted such that they did not protrude posteriorly and then secured in this position.  The patient underwent a reduction maneuver during provisional and definitive fixation, which required myself and Mr. Renae Fickle as well as an additional assistant to help with securing the bars while we held the reduction.  This was quite successful, and then we  proceeded with two additional maneuvers, 1 off the lateral malleolus.  A tonsil clamp was used to identify the appropriate place for a syndesmotic screw which was applied while I held reduction digitally to avoid any injury to the skin or risk an additional incision and then placed a tricortical screw with about 15 degrees of anterior angulation.  This helped to reduce the fibula in addition to the syndesmosis.  Attention was then turned to the medial side where distal to both the eschar and a additional blister and longitudinal  incision was made with advancement of the K-wire into the medial malleolus which was used to de rotate the fragment, achieve reduction and then drive that across in the metaphysis.  This was followed by placement of 36 mm, partially threaded screw which gave [place] good purchase and excellent reduction.  Following this, the additional adjustments were made to have the ankle in a plantigrade position.  As we did anticipate to continuing the fixture until complete union.  The blisters were then dressed with Mepitel and gauze followed by Kerlix and Ace wrap.  Patient was then awakened from anesthesia and transported to the PACU in stable condition.  Again [facility] did assist throughout the procedure and was absolutely necessary for its completion given the hands required for simultaneous reduction and securing of the fixture.  PROGNOSIS:  Ms. Rottinghaus remains at increased risk for multiple complications, primarily because of her comorbidities and poor balance. She is at very heavy fall risk and with the soft tissue condition has already had elevated risk for infection as well.  She will remain on the medical service until they are satisfied with her metabolic workup and will use Lovenox for DVT prophylaxis.     Doralee Albino. Carola Frost, M.D.     MHH/MEDQ  D:  10/25/2012  T:  10/26/2012  Job:  [id number]

## 2012-10-26 NOTE — Progress Notes (Signed)
PT Treatment Note  10/26/12 1059  PT Visit Information  Last PT Received On 10/26/12  Assistance Needed +2  PT Time Calculation  PT Start Time 1030  PT Stop Time 1054  PT Time Calculation (min) 24 min  Subjective Data  Subjective Pt asking to go to bathroom.  Precautions  Precautions Fall  Precaution Comments external fixator  Restrictions  Weight Bearing Restrictions Yes  RLE Weight Bearing NWB  Cognition  Overall Cognitive Status Appears within functional limits for tasks assessed/performed  Arousal/Alertness Awake/alert  Orientation Level Appears intact for tasks assessed  Behavior During Session [facility] for tasks performed  Bed Mobility  Bed Mobility Sit to Supine;Scooting to HOB  Sit to Supine 1: +2 Total assist  Sit to Supine: Patient Percentage 60%  Scooting to HOB 1: +2 Total assist  Scooting to [facility]: Patient Percentage 0%  Details for Bed Mobility Assistance verbal cues for technique  Transfers  Transfers Squat Pivot Transfers  Sit to Stand 2: Max assist;With upper extremity assist;With armrests;From chair/3-in-1  Stand to Sit 3: Mod assist;With upper extremity assist;With armrests;To chair/3-in-1  Stand Pivot Transfers 1: +2 Total assist  Stand Pivot Transfers: Patient Percentage 60%  Transfer via Psychologist, forensic  Details for Transfer Assistance Pt performed stand pivot from recliner to [facility] by holding my forearms.  For [facility] to bed used Stedy but with difficulty getting Stedy close enough to bed due to pt holding rt leg behind her.  PT - End of Session  Equipment Utilized During Treatment Gait belt  Activity Tolerance Patient tolerated treatment well  Patient left in bed;with call bell/phone within reach;with nursing in room  Nurse Communication Mobility status  PT - Assessment/Plan  Comments on Treatment Session Pt with more difficulty getting back up from recliner.    PT Plan Discharge plan remains appropriate;Frequency remains appropriate  PT Frequency Min  5X/week  Follow Up Recommendations CIR  PT equipment None recommended by PT  Acute Rehab PT Goals  PT Goal: Sit to Supine/Side - Progress Goal set today  PT Goal: Sit to Stand - Progress Goal set today  PT Goal: Stand to Sit - Progress Goal set today  PT Transfer Goal: Bed to Chair/Chair to Bed - Progress Goal set today  PT Goal: Stand - Progress Goal set today  PT General Charges  $$ ACUTE PT VISIT 1 Procedure  PT Treatments  $Therapeutic Activity 23-37 mins    [facility] PT [phone]

## 2012-10-26 NOTE — Progress Notes (Signed)
Rehab Admissions Coordinator Note:  Patient was screened by Trish Mage for appropriateness for an Inpatient Acute Rehab Consult.  At this time, I am deferring the prescreen.  An inpatient rehab consult has already been ordered and is pending completion today.  Trish Mage 10/26/2012, 10:37 AM  I can be reached at [phone].

## 2012-10-26 NOTE — Progress Notes (Signed)
Physical medicine rehabilitation consult was requested however [employer] has denied patient inpatient rehabilitation services thus we'll hold formal consult this time recommend skilled nursing facility versus home health

## 2012-10-26 NOTE — Progress Notes (Signed)
Addendum  Patient seen and examined, chart and data base reviewed.  I agree with the above assessment and plan.  For full details please see Mrs. Toya Smothers NP note.  Right lower extremity, Trimalleolar fracture. Status post surgery.  Needs placement, likely SNF.   Clint Lipps, MD [facility] Pager: [phone] 10/26/2012, 3:11 PM

## 2012-10-26 NOTE — Progress Notes (Signed)
[employer] onsite reviewer states they will not approve an inpt rehab admission for this diagnosis. I met with pt and her spouse at bedside. Wife had just signed up for [facility] 10/19/2012 but thought she had cancelled policy and had traditional Medicare and [place] supplemental. I explained that I had contacted customer service and onsite reviewer to verify her active policy. Advised pt to contact her insurance representative to  Clarify her enrollment and cancel policy if she preferred. Her current hospitalization will be billed under [facility] which I verified with rep at the pre service center also. Recommend SNF rehab at this time. I alerted RN CM and SW. Please call with any questions. [phone]

## 2012-10-27 DIAGNOSIS — S82853A Displaced trimalleolar fracture of unspecified lower leg, initial encounter for closed fracture: Secondary | ICD-10-CM | POA: Diagnosis not present

## 2012-10-27 DIAGNOSIS — N390 Urinary tract infection, site not specified: Secondary | ICD-10-CM

## 2012-10-27 DIAGNOSIS — M1990 Unspecified osteoarthritis, unspecified site: Secondary | ICD-10-CM | POA: Diagnosis not present

## 2012-10-27 DIAGNOSIS — J42 Unspecified chronic bronchitis: Secondary | ICD-10-CM | POA: Diagnosis not present

## 2012-10-27 LAB — BASIC METABOLIC PANEL
BUN: 16 mg/dL (ref 6–23)
Creatinine, Ser: 0.9 mg/dL (ref 0.50–1.10)
GFR calc Af Amer: 71 mL/min — ABNORMAL LOW (ref >90)
GFR calc non Af Amer: 61 mL/min — ABNORMAL LOW (ref >90)

## 2012-10-27 LAB — URINE CULTURE

## 2012-10-27 MED ORDER — DEXTROSE 5 % IV SOLN
2.0000 g | INTRAVENOUS | Status: DC
  Administered 2012-10-27: 2 g via INTRAVENOUS
  Filled 2012-10-27 (×3): qty 2

## 2012-10-27 MED ORDER — ACETAMINOPHEN 325 MG PO TABS: 325.0000 mg | ORAL_TABLET | Freq: Four times a day (QID) | ORAL | Status: DC | PRN

## 2012-10-27 MED ORDER — HYDROCODONE-ACETAMINOPHEN 5-325 MG PO TABS: 1.0000 | ORAL_TABLET | Freq: Four times a day (QID) | ORAL | Status: DC | PRN

## 2012-10-27 MED ORDER — ENOXAPARIN SODIUM 40 MG/0.4ML ~~LOC~~ SOLN: 40.0000 mg | SUBCUTANEOUS | Status: DC

## 2012-10-27 NOTE — Progress Notes (Signed)
[facility] PROGRESS NOTE  Berniece Abid Bunkley ZOX:[id number] DOB: 1936-10-08 DOA: 10/25/2012 PCP: Nadean Corwin, MD  Assessment/Plan: Trimalleolar fracture:  - Management per orthopedics  - Pain medication and muscle relaxants per orthopedics increased pain today  - weight bearing status per ortho  - DVT prophylaxis per ortho   UTI -will send cultures -Rocephin day #1  Frequent mechanical falls: MRI per patient reportedly normal making CVA and normal pressure hydrocephalus less likely. Peripheral neuropathy likely contributing to balance issues. Given lack of syncope, palpitations, LOC, heart arrhythmia less likely cause of falls.  - HIV (patient consented in presence of her husband), RPR, TSH,  B12 all WNL., B1 level pending this am  - PT/OT for right foot fracture in conjunction with vestibular rehab if needed which can be done again as an outpatient   HTN/HLD and possible CAD given Q-waves on ECG: Blood pressure and pulse elevated initially, likely due to severe pain. Denies chest pain. Good control today. continue to control pain . Continue to hold diuretic.  - continue low dose beta blocker  - Continue statin  - Outpatient follow up with primary care doctor for further evaluation of CAD   Chronic bronchitis: Low lung volumes on CXR, on room air.  - Continue advair  - Albuterol PRN  - Incentive spirometry  - Continue oral steroids. Back to home regimen today - Outpatient PFTs after discharge   Steroid dependence (likely iatrogenic adrenal insufficiency):  - Stress dose steroids perioperatively: Hydrocortisone 25 mg every 8h on date of surgery, and for 2 doses afterwards - resume home prednisone dose today  - Endocrinology follow up OP   GERD, stable. Continue PPI : diet advanced to bland today   Osteoporosis, may be due to chronic steroids and now with ankle fracture  - Vitamin D level WNL -Will need OP follow up with PCP   Code Status: full Family  Communication:  Disposition Plan: likely to snf   Consultants:  ortho  Procedures:  ORIF rt ankle 10/25/12  Antibiotics:  10/25/12>>>10/27/12  HPI/Subjective: Awake alert. Reports worsening pain right ankle during night  Objective: Filed Vitals:   10/26/12 2128 10/26/12 2140 10/27/12 0119 10/27/12 0518  BP: 122/72  116/58 108/62  Pulse: 82  74 71  Temp:   98.8 F (37.1 C) 99.1 F (37.3 C)  TempSrc:   Oral Oral  Resp:   20 18  Height:      Weight:    90.3 kg (199 lb 1.2 oz)  SpO2:  94% 95% 94%    Intake/Output Summary (Last 24 hours) at 10/27/12 0743 Last data filed at 10/26/12 1949  Gross per 24 hour  Intake   1970 ml  Output      0 ml  Net   1970 ml   Filed Weights   10/25/12 2300 10/26/12 0500 10/27/12 0518  Weight: 89.2 kg (196 lb 10.4 oz) 89.1 kg (196 lb 6.9 oz) 90.3 kg (199 lb 1.2 oz)    Exam:   General:  Awake alert somewhat uncomfortable appearing  Cardiovascular: RRR +murmur No gallup rub  Respiratory: normal effort BSCTAB No rhonchi no wheeze  Abdomen: soft +BS non-tender to palpation  Musckoskeletal: right LE with external fixation hardware. Site at posterior ankle with moderate amount bldy drainage. Toes warm to touch  Data Reviewed: Basic Metabolic Panel:  Lab 10/26/12 8119 10/25/12 0808  NA 138 139  K 3.1* 3.1*  CL 100 99  CO2 26 29  GLUCOSE 172* 108*  BUN 17  22  CREATININE 0.97 1.06  CALCIUM 8.3* 8.9  MG -- --  PHOS -- --   Liver Function Tests: No results found for this basename: AST:5,ALT:5,ALKPHOS:5,BILITOT:5,PROT:5,ALBUMIN:5 in the last 168 hours No results found for this basename: LIPASE:5,AMYLASE:5 in the last 168 hours No results found for this basename: AMMONIA:5 in the last 168 hours CBC:  Lab 10/26/12 0640 2012/11/15 0808  WBC 10.4 10.2  NEUTROABS -- 5.9  HGB 12.1 14.4  HCT 36.9 42.5  MCV 94.9 92.8  PLT 227 257   Cardiac Enzymes:  Lab 11/15/12 0821  CKTOTAL --  CKMB --  CKMBINDEX --  TROPONINI <0.30   BNP  (last 3 results) No results found for this basename: PROBNP:3 in the last 8760 hours CBG: No results found for this basename: GLUCAP:5 in the last 168 hours  Recent Results (from the past 240 hour(s))  SURGICAL PCR SCREEN     Status: Normal   Collection Time   11-15-2012  5:01 PM      Component Value Range Status Comment   MRSA, PCR NEGATIVE  NEGATIVE Final    Staphylococcus aureus NEGATIVE  NEGATIVE Final      Studies: Dg Ankle 2 Views Right  2012-11-15  *RADIOLOGY REPORT*  Clinical Data: Post reduction  RIGHT ANKLE - 2 VIEW  Comparison: 904 this morning  Findings: Detail obscured by cast.  Oblique fracture distal fibula appears unchanged and remains mildly displaced.  There is reduced displacement of medial malleolar fracture.  There is decreased widening of the mortise medially.  There remains a displaced posterior malleolar fracture with minimal decrease in this placement when compared to prior study.  IMPRESSION: Trimalleolar fracture with significant displacement, mildly decreased displacement of some fragments when compared to prior study.   Original Report Authenticated By: Esperanza Heir, M.D.    Dg Ankle Complete Right  2012/11/15  *RADIOLOGY REPORT*  Clinical Data: Fracture fixation.  DG C-ARM 61-120 MIN, RIGHT ANKLE - COMPLETE 3+ VIEW  Technique: Four fluoroscopic spot views of the right ankle are provided.  Comparison:  CT scan and plain films Nov 15, 2012.  Findings: Four fluoroscopic spot views are provided and demonstrate placement of syndesmotic screw and screw in the medial malleolus. External fixator placement is also identified.  IMPRESSION: ORIF ankle fracture and external fixator placement as above.   Original Report Authenticated By: Holley Dexter, M.D.    Ct Ankle Right Wo Contrast  11-15-2012  *RADIOLOGY REPORT*  Clinical Data: Fracture dislocation of the right ankle.  CT OF THE RIGHT ANKLE WITH CONTRAST  Technique:  Multidetector CT imaging was performed following the standard  protocol during bolus administration of intravenous contrast.  Comparison: Radiographs dated 11-15-12  Findings: The fracture dislocation has been reduced.  Posterior malleolar fracture involves only a small segment of the articular surface of the distal tibia. There is approximately 5 mm of distraction and 4 mm of overriding.  There is 6 mm of distraction of the horizontal fracture through the medial malleolus.  The distal tibiofibular syndesmosis is disrupted.  There is an avulsion of bone from the lateral aspect of the distal tibia at the insertion of the syndesmosis and this fragment is displaced laterally between a fragment of the distal fibula and the site of origin of this avulsion.  In addition, the tibial attachment of the syndesmosis is avulsed with its osseous insertion and is displaced anterior to its normal position.  There is a small avulsion of bone from the proximal dorsal aspect of the cuboid adjacent to  the anterior process of the calcaneus.  There is no evidence of entrapped tendons at the ankle joint. Talus is intact.  Calcaneus is intact.  IMPRESSION:  1.  Disruption of the distal tibial fibular syndesmosis.  The tibial insertion of the syndesmosis is avulsed with a bone fragment which is trapped between a distal fibular fragment and the origin of the avulsed tibial fragment.  This is best seen on image number 16 of series 2.  There is also subluxation anteriorly of the fibular fragment with respect to the normal position at the syndesmosis. 2.  Slightly distracted medial and posterior malleolar fractures of the distal tibia as described. 3.  Small avulsion fracture of the dorsal proximal aspect of the cuboid.   Original Report Authenticated By: Francene Boyers, M.D.    Ct Foot Right Wo Contrast  10/25/2012  *RADIOLOGY REPORT*  Clinical Data: Fracture dislocation of the right ankle.  CT OF THE RIGHT FOOT WITHOUT CONTRAST  Technique:  Multidetector CT imaging was performed according to the  standard protocol. Multiplanar CT image reconstructions were also generated.  Comparison: Radiographs dated 10/25/2012  Findings: There is a small avulsion fracture of the dorsal proximal aspect of the cuboid adjacent to the anterior process of the calcaneus.  The other bones of the foot are intact.  Ankle fractures are described on the CT scan of the ankle of the same date.  Slight arthritis of the head of the first metatarsal.  IMPRESSION: Small avulsion from the dorsal proximal aspect of the cuboid adjacent to the anterior process of the calcaneus.   Original Report Authenticated By: Francene Boyers, M.D.    Dg Chest Portable 1 View  10/25/2012  *RADIOLOGY REPORT*  Clinical Data: Fall.  Preop.  PORTABLE CHEST - 1 VIEW  Comparison: 02/12/2012  Findings: Low lung volumes.  Normal heart size.  Clear lungs.  No pneumothorax.  No obvious acute bony deformity.  IMPRESSION: No active cardiopulmonary disease.   Original Report Authenticated By: Jolaine Click, M.D.    Dg Ankle Right Port  10/25/2012  *RADIOLOGY REPORT*  Clinical Data: Status post fracture fixation.  PORTABLE RIGHT ANKLE - 2 VIEW  Comparison: CT and plain films earlier this same date.  Findings: A single lag screw is seen in the medial malleolus. Syndesmotic screw is also in place.  Distal fibular fracture is again identified.  The patient is in a external fixator. The tibiotalar joint has been reduced.  IMPRESSION: ORIF ankle fractures and placement of an external fixator.  No acute finding.   Original Report Authenticated By: Holley Dexter, M.D.    Dg Ankle Right Port  10/25/2012  *RADIOLOGY REPORT*  Clinical Data: Fracture.  Postreduction.  PORTABLE RIGHT ANKLE - 2 VIEW  Comparison: None.  Findings: Trimalleolar fracture again noted.  Alignment is significantly improved but not anatomic, with 9 mm of separation between the medial tibial plafond and the medial talus but only 2 mm of separation laterally, and 6 mm posterior displacement of the  inferior fragment of the lateral malleolar fracture with respect to the superior.  No new fracture or specific complicating feature is observed.  A fiberglass splint noted.  IMPRESSION:  1.  Significantly improved but not anatomic alignment, with mild persistent medial displacement of the tibia with respect to the talus, and mild separation of the lateral malleolar oblique fracture site.  Interval fiberglass splinting.   Original Report Authenticated By: Gaylyn Rong, M.D.    Dg Ankle Right Port  10/25/2012  *RADIOLOGY REPORT*  Clinical  Data: Ankle pain post fall  PORTABLE RIGHT ANKLE - 2 VIEW  Comparison: Portable exam 0820 hours without priors for comparison.  Findings: Fracture-dislocation right ankle: Lateral dislocation of talus from tibiotalar joint. Oblique distal fibular fracture displaced laterally with mild apex medial angulation. Fracture medial malleolus displaced laterally. Displaced posterior malleolar fracture fragment.  Tarsals grossly intact. Bones appear demineralized.  IMPRESSION: Trimalleolar fracture-dislocation right ankle.   Original Report Authenticated By: Ulyses Southward, M.D.    Dg C-arm [phone] Min  10/25/2012  *RADIOLOGY REPORT*  Clinical Data: Fracture fixation.  DG C-ARM 61-120 MIN, RIGHT ANKLE - COMPLETE 3+ VIEW  Technique: Four fluoroscopic spot views of the right ankle are provided.  Comparison:  CT scan and plain films 10/25/12.  Findings: Four fluoroscopic spot views are provided and demonstrate placement of syndesmotic screw and screw in the medial malleolus. External fixator placement is also identified.  IMPRESSION: ORIF ankle fracture and external fixator placement as above.   Original Report Authenticated By: Holley Dexter, M.D.     Scheduled Meds:   . cholecalciferol  5,000 Units Oral Daily  . docusate sodium  100 mg Oral BID  . enoxaparin (LOVENOX) injection  40 mg Subcutaneous Q24H  . losartan  100 mg Oral Daily  . metoprolol tartrate  12.5 mg Oral BID  .  mometasone-formoterol  2 puff Inhalation BID  . multivitamin with minerals  1 tablet Oral Daily  . oxybutynin  5 mg Oral Daily  . pantoprazole  40 mg Oral BID AC  . polyethylene glycol  17 g Oral Daily  . predniSONE  10 mg Oral Daily  . senna  1 tablet Oral BID  . simvastatin  20 mg Oral q1800  . sodium chloride  3 mL Intravenous Q12H   Continuous Infusions:   Principal Problem:  *Closed trimalleolar fracture of right ankle Active Problems:  Hyperlipidemia  Chronic bronchitis  GERD (gastroesophageal reflux disease)  Neuropathy  Hypertension  Balance disorder  Iatrogenic adrenal insufficiency  Frequent falls    Time spent: 67    [facility] M  [facility]  If 8PM-8AM, please contact night-coverage at [email or web address], password [facility] 10/27/2012, 7:43 AM  LOS: 2 days

## 2012-10-27 NOTE — Progress Notes (Signed)
UR COMPLETED  

## 2012-10-27 NOTE — Progress Notes (Signed)
Met with patient and her husband at bedside and gave SNF bed offers- they are leaning towards [facility]. I have contacted [facility] and will f/u in the morning to further plan- Reece Levy, MSW, Theresia Majors [phone]

## 2012-10-27 NOTE — Care Management Note (Signed)
    Page 1 of 1   10/28/2012     3:57:54 PM   CARE MANAGEMENT NOTE 10/28/2012  Patient:  Marie [name], Marie [name]   Account Number:  [id number]  Date Initiated:  10/27/2012  Documentation initiated by:  Letha Cape  Subjective/Objective Assessment:   dx ankle fx  admit-lives with spouse.     Action/Plan:   pt eval- rec CIR- pt 's insurance [employer] will not apporve CIR.   Anticipated DC Date:  10/28/2012   Anticipated DC Plan:  SKILLED NURSING FACILITY  In-house referral  Clinical Social Worker      DC Planning Services  CM consult      Choice offered to / List presented to:             Status of service:  Completed, signed off Medicare Important Message given?   (If response is "NO", the following Medicare IM given date fields will be blank) Date Medicare IM given:   Date Additional Medicare IM given:    Discharge Disposition:  SKILLED NURSING FACILITY  Per UR Regulation:  Reviewed for med. necessity/level of care/duration of stay  If discussed at Long Length of Stay Meetings, dates discussed:    Comments:  10/28/12 15:57 Letha Cape RN, BSN [phone] patient dc to [facility] today, CSW following.  10/27/12 10:40 Letha Cape RN, BSN [phone] patient lives with spouse,  per physical therapy recs CIR, but patient's insurance which is [employer] will not approve CIR for patient's dx.  CSW following for snf placement.

## 2012-10-27 NOTE — Progress Notes (Signed)
Orthopedic Tech Progress Note Patient Details:  Marie [name] 1936/09/05 [id number] OHF with trapeze bar applied to patient's bed. Patient comfortable with hanging bar.  Patient ID: Marie [name], female   DOB: [birth year], 77 y.o.   MRN: [id number]   Orie Rout 10/27/2012, 3:51 PM

## 2012-10-27 NOTE — Progress Notes (Signed)
Addendum  Patient seen and examined, chart and data base reviewed.  I agree with the above assessment and plan.  For full details please see Mrs Toya Smothers NP note.  Right lower extremity trimalleolar fracture status post surgery with external fixation.  UTI started on Rocephin.  Disposition to skilled nursing facility per orthopedics notes no weight bearing for 8 weeks.   Clint Lipps, MD [facility] Pager: [phone] 10/27/2012, 1:33 PM

## 2012-10-27 NOTE — Progress Notes (Signed)
Orthopaedic Trauma Service (OTS)  Subjective: 2 Days Post-Op Procedure(s) (LRB): EXTERNAL FIXATION LEG (Right)    Doing well this am Worked with therapies to transfer to chair Had some pain in R ankle last night but improved today Ice helped ankle pain last night  Insurance denied CIR Pt will need SNF  No acute ortho issues noted  Objective: Current Vitals Blood pressure 106/57, pulse 74, temperature 99.1 F (37.3 C), temperature source Oral, resp. rate 18, height 5\' 4"  (1.626 m), weight 90.3 kg (199 lb 1.2 oz), SpO2 94.00%. Vital signs in last 24 hours: Temp:  [98.3 F (36.8 C)-99.1 F (37.3 C)] 99.1 F (37.3 C) (01/09 0518) Pulse Rate:  [71-86] 74  (01/09 1016) Resp:  [18-20] 18  (01/09 0518) BP: (106-122)/(54-72) 106/57 mmHg (01/09 1016) SpO2:  [92 %-96 %] 94 % (01/09 0518) Weight:  [90.3 kg (199 lb 1.2 oz)] 90.3 kg (199 lb 1.2 oz) (01/09 0518)  Intake/Output from previous day: 01/08 0701 - 01/09 0700 In: 1970 [P.O.:240; I.V.:1530; IV Piggyback:200] Out: -   LABS  Basename 10/26/12 0640 10/25/12 0808  HGB 12.1 14.4    Basename 10/26/12 0640 10/25/12 0808  WBC 10.4 10.2  RBC 3.89 4.58  HCT 36.9 42.5  PLT 227 257    Basename 10/27/12 0635 10/26/12 0640  NA 142 138  K 3.6 3.1*  CL 106 100  CO2 26 26  BUN 16 17  CREATININE 0.90 0.97  GLUCOSE 118* 172*  CALCIUM 8.4 8.3*   No results found for this basename: LABPT:2,INR:2 in the last 72 hours  25 OH Vitamin D- 56 (normal range)  Physical Exam  Gen: awake and alert, appears very comfortable, observed pt transfer from [facility] to chair with therapy Lungs: clear  Cardiac: s1 and s2  Abd: soft, + BS  Ext:       Right Lower Extremity   Ex fix intact    pinsites stable and look good   Dressings changed, all wounds look good, blisters stable   Distal motor and sensory functions at baseline   Ext warm   + DP pulse  Covered sharp end of ex fix pin with mepilex  Swelling improving  Pt fidgeting during  dressing change almost tremor like   Assessment/Plan: 2 Days Post-Op Procedure(s) (LRB): EXTERNAL FIXATION LEG (Right)  77 y/o female s/p fall   1. Fall   Labs look good  As outpatient pt may benefit from a program called balance for life, which we can provide contact info on during f/u  2. R trimall ankle fracture dislocation s/p ex fix and percutaneous fixation   NWB x 8 weeks   Ex fix will remain on for approximately 6 weeks   Ex fix and limited percutaneous fixation will serve as definitive treatment   Toe and knee motion as tolerated   Continue with therapies   Dressing changed today  Continue to Ice and elevate  Dressing change on saturday 3. Metabolic bone disease   Vitamin d levels look good   Continue with supplementation   Again chronic disease and chronic systemic steroid use likely contributing to poor bone density   Pt should have a DEXA scan performed as an outpt if she has not had one in the last 2 years, we can arrange for this during f/u  4. Chronic Bronchitis with chronic steroid use   Home regimen 5. Pain   Pt with minimal pain   Po tylenol and norco  6. DVT/PE prophylaxis  lovenox x 21 days post op (day 2/21)  7. Med issues   Continue per primary team  8. Dispo   Pt will need snf  Follow up in 7 days with ortho   [name], PA-C [facility] [phone] (P) 10/27/2012, 10:26 AM

## 2012-10-27 NOTE — Progress Notes (Signed)
Physical Therapy Treatment Patient Details Name: Marie [name] MRN: [id number] DOB: 05-28-1936 Today's Date: 10/27/2012 Time: 0865-7846 PT Time Calculation (min): 30 min  PT Assessment / Plan / Recommendation Comments on Treatment Session  Patient slightly impulsive with stand pivot transfers with RW and needs cues to slow down and assist for safety due to walker too far out and tends to use walker for transfers.  Feel increased time of rehab at SNF will benefit her which is plan for D/C per patient.    Follow Up Recommendations  SNF                 Equipment Recommendations  None recommended by PT        Frequency Min 5X/week   Plan Discharge plan needs to be updated    Precautions / Restrictions Precautions Precautions: Fall Precaution Comments: external fixator Restrictions Weight Bearing Restrictions: Yes RLE Weight Bearing: Non weight bearing   Pertinent Vitals/Pain 5/10 left LE RN delivered meds prior to PT    Mobility  Bed Mobility Supine to Sit: 4: Min assist Sitting - Scoot to Edge of Bed: 4: Min assist Transfers Sit to Stand: 1: +2 Total assist;From bed;From chair/3-in-1;With armrests Sit to Stand: Patient Percentage: 50% Stand to Sit: 3: Mod assist;With armrests;To chair/3-in-1 Stand Pivot Transfers: 2: Max assist Details for Transfer Assistance: stand pivot with walker.  Very unsteady and pushing walker out too far.  Has 4 wheeled walker at home and report planned to put her knee on the walker seat.  Educated would not work due to pins and fixator in the way.      PT Goals Acute Rehab PT Goals Pt will go Supine/Side to Sit: with modified independence PT Goal: Supine/Side to Sit - Progress: Progressing toward goal Pt will go Sit to Stand: with supervision PT Goal: Sit to Stand - Progress: Progressing toward goal Pt will go Stand to Sit: with supervision PT Goal: Stand to Sit - Progress: Progressing toward goal Pt will Transfer Bed to Chair/Chair to  Bed: with supervision PT Transfer Goal: Bed to Chair/Chair to Bed - Progress: Progressing toward goal  Visit Information  Last PT Received On: 10/27/12    Subjective Data  Subjective: Can't go on the bedside commode.  That leg was bleeding overnight   Cognition  Overall Cognitive Status: Appears within functional limits for tasks assessed/performed Arousal/Alertness: Awake/alert Behavior During Session: Anxious         End of Session PT - End of Session Equipment Utilized During Treatment: Gait belt Activity Tolerance: Patient tolerated treatment well Patient left: in chair;with call bell/phone within reach;with family/visitor present;Other (comment) (PA in room to change dressing)   GP     Kiira Brach,CYNDI 10/27/2012, 1:18 PM Sheran Lawless, PT [phone] 10/27/2012

## 2012-10-27 NOTE — Progress Notes (Signed)
RN was attempting to clean pt's iv site and change dressing. Pt moved arm & IV came out. RN attempted two times to get pt a new site and was unsuccessful. RN paged IV team to get pt a new IV site. Pt's sites are unremarkable and pt has no complaints. -Judy Pimple, RN

## 2012-10-28 DIAGNOSIS — N390 Urinary tract infection, site not specified: Secondary | ICD-10-CM | POA: Diagnosis not present

## 2012-10-28 DIAGNOSIS — S9306XA Dislocation of unspecified ankle joint, initial encounter: Secondary | ICD-10-CM | POA: Diagnosis not present

## 2012-10-28 DIAGNOSIS — E785 Hyperlipidemia, unspecified: Secondary | ICD-10-CM | POA: Diagnosis not present

## 2012-10-28 DIAGNOSIS — E2749 Other adrenocortical insufficiency: Secondary | ICD-10-CM | POA: Diagnosis not present

## 2012-10-28 DIAGNOSIS — S82853A Displaced trimalleolar fracture of unspecified lower leg, initial encounter for closed fracture: Secondary | ICD-10-CM | POA: Diagnosis not present

## 2012-10-28 DIAGNOSIS — M6281 Muscle weakness (generalized): Secondary | ICD-10-CM | POA: Diagnosis not present

## 2012-10-28 DIAGNOSIS — W19XXXA Unspecified fall, initial encounter: Secondary | ICD-10-CM | POA: Diagnosis not present

## 2012-10-28 DIAGNOSIS — Z9181 History of falling: Secondary | ICD-10-CM | POA: Diagnosis not present

## 2012-10-28 DIAGNOSIS — R29818 Other symptoms and signs involving the nervous system: Secondary | ICD-10-CM | POA: Diagnosis not present

## 2012-10-28 DIAGNOSIS — R269 Unspecified abnormalities of gait and mobility: Secondary | ICD-10-CM | POA: Diagnosis not present

## 2012-10-28 DIAGNOSIS — I10 Essential (primary) hypertension: Secondary | ICD-10-CM | POA: Diagnosis not present

## 2012-10-28 DIAGNOSIS — F329 Major depressive disorder, single episode, unspecified: Secondary | ICD-10-CM | POA: Diagnosis not present

## 2012-10-28 LAB — PROTEIN ELECTROPHORESIS, SERUM
Alpha-1-Globulin: 6.2 % — ABNORMAL HIGH (ref 2.9–4.9)
Alpha-2-Globulin: 10.9 % (ref 7.1–11.8)
Beta Globulin: 6.9 % (ref 4.7–7.2)
Gamma Globulin: 10.4 % — ABNORMAL LOW (ref 11.1–18.8)
M-Spike, %: NOT DETECTED g/dL

## 2012-10-28 LAB — CBC
HCT: 32.2 % — ABNORMAL LOW (ref 36.0–46.0)
Hemoglobin: 10.4 g/dL — ABNORMAL LOW (ref 12.0–15.0)
WBC: 9 10*3/uL (ref 4.0–10.5)

## 2012-10-28 LAB — BASIC METABOLIC PANEL
BUN: 14 mg/dL (ref 6–23)
Chloride: 106 mEq/L (ref 96–112)
GFR calc Af Amer: 76 mL/min — ABNORMAL LOW (ref >90)
Glucose, Bld: 105 mg/dL — ABNORMAL HIGH (ref 70–99)
Potassium: 3.5 mEq/L (ref 3.5–5.1)

## 2012-10-28 MED ORDER — POLYETHYLENE GLYCOL 3350 17 G PO PACK: 17.0000 g | PACK | Freq: Every day | ORAL | Status: DC

## 2012-10-28 MED ORDER — CIPROFLOXACIN HCL 500 MG PO TABS: 500.0000 mg | ORAL_TABLET | Freq: Two times a day (BID) | ORAL | Status: AC

## 2012-10-28 MED ORDER — DSS 100 MG PO CAPS: 100.0000 mg | ORAL_CAPSULE | Freq: Two times a day (BID) | ORAL | Status: DC

## 2012-10-28 MED ORDER — BISACODYL 10 MG RE SUPP: 10.0000 mg | Freq: Every day | RECTAL | Status: DC | PRN

## 2012-10-28 MED ORDER — VITAMIN D3 25 MCG (1000 UNIT) PO TABS: 5000.0000 [IU] | ORAL_TABLET | Freq: Every day | ORAL | Status: DC

## 2012-10-28 MED ORDER — METOPROLOL TARTRATE 12.5 MG HALF TABLET: 12.5000 mg | ORAL_TABLET | Freq: Two times a day (BID) | ORAL | Status: DC

## 2012-10-28 NOTE — Discharge Summary (Signed)
Addendum  Patient seen and examined, chart and data base reviewed.  I agree with the above assessment and the discharge plan.  For full details please see Mrs Toya Smothers NP note.  Right lower extremity trimalleolar fracture status post surgery with external fixation.  UTI started on Rocephin.  Disposition to skilled nursing facility per orthopedics notes no weight bearing for 8 weeks.  Clint Lipps Pager: [phone] 10/28/2012, 12:25 PM

## 2012-10-28 NOTE — Progress Notes (Addendum)
Nsg Discharge Note  Admit Date:  10/25/2012 Discharge date: 10/28/2012   Orvis Brill Landess to be D/C'd Skilled nursing facility per MD order.  AVS completed.  Copy for chart, and copy for patient signed, and dated. Patient/caregiver able to verbalize understanding.  Discharge Medication:  [place], [place]  Home Medication Instructions ZOX:[id number]   Printed on:10/28/12 1828  Medication Information                    Fluticasone-Salmeterol (ADVAIR DISKUS) 250-50 MCG/DOSE AEPB Inhale 1 puff into the lungs every 12 (twelve) hours.            amitriptyline (ELAVIL) 10 MG tablet Take 10 mg by mouth daily.            omeprazole (PRILOSEC) 20 MG capsule Take 20 mg by mouth 2 (two) times daily.            oxybutynin (DITROPAN) 5 MG tablet Take 5 mg by mouth daily.            pravastatin (PRAVACHOL) 40 MG tablet Take 40 mg by mouth daily.            aspirin 81 MG tablet Take 81 mg by mouth daily.            Multiple Vitamin (MULTIVITAMIN) capsule Take 1 capsule by mouth daily.            Cholecalciferol (VITAMIN D-3) 5000 UNITS TABS Take 1 tablet by mouth daily.            losartan-hydrochlorothiazide (HYZAAR) 100-25 MG per tablet Take 1 tablet by mouth daily.            traMADol (ULTRAM) 50 MG tablet Take 50 mg by mouth 2 (two) times daily as needed. For coughing           predniSONE (DELTASONE) 5 MG tablet Take 10 mg by mouth daily.           acetaminophen (TYLENOL) 325 MG tablet Take 1-2 tablets (325-650 mg total) by mouth every 6 (six) hours as needed.           enoxaparin (LOVENOX) 40 MG/0.4ML injection Inject 0.4 mLs (40 mg total) into the skin daily.           HYDROcodone-acetaminophen (NORCO/VICODIN) 5-325 MG per tablet Take 1-2 tablets by mouth every 6 (six) hours as needed for pain.           bisacodyl (DULCOLAX) 10 MG suppository Place 1 suppository (10 mg total) rectally daily as needed.           cholecalciferol (VITAMIN D) 1000 UNITS tablet Take 5  tablets (5,000 Units total) by mouth daily.           docusate sodium 100 MG CAPS Take 100 mg by mouth 2 (two) times daily.           metoprolol tartrate (LOPRESSOR) 12.5 mg TABS Take 0.5 tablets (12.5 mg total) by mouth 2 (two) times daily.           polyethylene glycol (MIRALAX / GLYCOLAX) packet Take 17 g by mouth daily.           ciprofloxacin (CIPRO) 500 MG tablet Take 1 tablet (500 mg total) by mouth 2 (two) times daily.             Discharge Assessment: Filed Vitals:   10/28/12 0437  BP: 130/66  Pulse: 71  Temp: 99 F (37.2 C)  Resp: 18  Skin clean, dry and intact without evidence of skin break down, no evidence of skin tears noted. IV catheter discontinued intact. Site without signs and symptoms of complications - no redness or edema noted at insertion site, patient denies c/o pain - only slight tenderness at site.  Dressing with slight pressure applied.  D/c Instructions-Education: Discharge instructions given to transporters.   Thereasa Iannello Consuella Lose, RN 10/28/2012 6:28 PM

## 2012-10-28 NOTE — Progress Notes (Signed)
[facility] ([facility])  Subjective: 3 Days Post-Op Procedure(s) (LRB): EXTERNAL FIXATION LEG (Right)  Doing well  No issues Ready to go to snf  Objective: Current Vitals Blood pressure 130/66, pulse 71, temperature 99 F (37.2 C), temperature source Oral, resp. rate 18, height 5\' 4"  (1.626 m), weight 88.4 kg (194 lb 14.2 oz), SpO2 95.00%. Vital signs in last 24 hours: Temp:  [98.7 F (37.1 C)-99 F (37.2 C)] 99 F (37.2 C) (01/10 0437) Pulse Rate:  [71-83] 71  (01/10 0437) Resp:  [18] 18  (01/10 0437) BP: (106-135)/(57-74) 130/66 mmHg (01/10 0437) SpO2:  [95 %-99 %] 95 % (01/10 0437) Weight:  [88.4 kg (194 lb 14.2 oz)] 88.4 kg (194 lb 14.2 oz) (01/10 0437)  Intake/Output from previous day: 01/09 0701 - 01/10 0700 In: 843 [P.O.:840; I.V.:3] Out: -   LABS  Basename 10/28/12 0605 10/26/12 0640  HGB 10.4* 12.1    Basename 10/28/12 0605 10/26/12 0640  WBC 9.0 10.4  RBC 3.33* 3.89  HCT 32.2* 36.9  PLT 211 227    Basename 10/28/12 0720 10/27/12 0635  NA 141 142  K 3.5 3.6  CL 106 106  CO2 26 26  BUN 14 16  CREATININE 0.85 0.90  GLUCOSE 105* 118*  CALCIUM 8.4 8.4   No results found for this basename: LABPT:2,INR:2 in the last 72 hours    Physical Exam  Gen: Awake and alert, sitting in bedside chair, finishing breakfast Lungs:clear Cardiac:reg Abd:+ BS, NT Ext:       Right Lower Extremity  Ex fix intact  pinsites look good  Expected drainage from calcaneus pin  Distal motor and sensory functions intact  + DP pulse  Swelling looks good    Imaging No results found.  Assessment/Plan: 3 Days Post-Op Procedure(s) (LRB): EXTERNAL FIXATION LEG (Right)  77 y/o female s/p fall   1. Fall               Labs look good             As outpatient pt may benefit from a program called balance for life, which we can provide contact info on during f/u   2. R trimall ankle fracture dislocation s/p ex fix and percutaneous fixation               NWB  x 8 weeks               Ex fix will remain on for approximately 6 weeks               Ex fix and limited percutaneous fixation will serve as definitive treatment               Toe and knee motion as tolerated               Continue with therapies               Dressing changes daily starting tomorrow  Was pinsites with soap and water, wrap with kerlix to tamp down pin-skin interface to limit motion             Continue to Ice and elevate             3. Metabolic bone disease               Vitamin d levels look good                   Continue with supplementation  Again chronic disease and chronic systemic steroid use likely contributing to poor bone density               Pt should have a DEXA scan performed as an outpt if she has not had one in the last 2 years, we can arrange for this during f/u    4. Chronic Bronchitis with chronic steroid use               Home regimen 5. Pain               Pt with minimal pain               Po tylenol and norco   6. DVT/PE prophylaxis               lovenox x 21 days post op (day 3/21)   7. Med issues               Continue per primary team    UTI- on rocephin 8. Dispo               Stable for d/c to snf today             Follow up in 7 days with ortho   Activity- OOB as tolerated with assistance, NWB R leg   [name], PA-C [facility] [phone] (P) 10/28/2012, 9:32 AM

## 2012-10-28 NOTE — Discharge Summary (Signed)
Physician Discharge Summary  Laparis Durrett Freiman ZOX:[id number] DOB: 1936-09-16 DOA: 10/25/2012  PCP: Nadean Corwin, MD  Admit date: 10/25/2012 Discharge date: 10/28/2012  Time spent: 40 minutes  Recommendations for Outpatient Follow-up:  1. Follow up with Dr handy 7 days 2. Follow up with PCP 1 week  Discharge Diagnoses:  Principal Problem:  *Closed trimalleolar fracture of right ankle Active Problems:  Hyperlipidemia  Chronic bronchitis  GERD (gastroesophageal reflux disease)  Neuropathy  Hypertension  Balance disorder  Iatrogenic adrenal insufficiency  Frequent falls  UTI (lower urinary tract infection)   Discharge Condition: stable  Diet recommendation: bland   Filed Weights   10/26/12 0500 10/27/12 0518 10/28/12 0437  Weight: 89.1 kg (196 lb 6.9 oz) 90.3 kg (199 lb 1.2 oz) 88.4 kg (194 lb 14.2 oz)    History of present illness:  The patient is a 77 year old female with history of hypertension, hyperlipidemia, chronic bronchitis, steroid dependence, urinary incontinence, and frequent falls who presented on 10/25/12 with a fall resulting in trimalleolar fracture. She was walking from the bathroom towards her walker without using an assist device. The lights were on and her right ankle twisted or gave out and she fell on it, causing a fracture. She denied associated chest pain, palpitations, shortness of breath, flushing, lightheadedness, spinning room, vision darkening, muffled hearing, nausea. She did not hit her head or lose consciousness. She was in pain and transported to the ER for evaluation.  The patient stated that she has baseline difficulty with balance and walks with a cane or a rolling walker. She denied lightheadedness or room spinning or floor tilting, but frequently, she leans to the side when standing and falls over. She stated she does not realize she is leaning until it is too late and then she falls over. She stated she has poor feeling in her feet and her  legs are numb to the level of the knees bilaterally. She wears glasses and has some mild difficulty hearing, minimizes rugs and stairs in the household. She has had a work up by her primary care doctor for her falls which included an MRI brain which was reportedly normal. She participated in vestibular therapy previously which helped her balance.    Hospital Course:   Trimalleolar fracture:  10/25/12 pt underwent ORIF with external fixation right leg.  - Pain medication and muscle relaxants provided with good relief.   - will be discharged with current regimen as above -follow up with ortho 7 days -OOB with assist as tolerated NWB rt leg UTI  -cultures with no growth -received 2 days rocephin. Will discharge with cipro 3 days  Frequent mechanical falls: MRI per patient reportedly normal making CVA and normal pressure hydrocephalus less likely. Peripheral neuropathy likely contributing to balance issues. Given lack of syncope, palpitations, LOC, heart arrhythmia less likely cause of falls.  - HIV (patient consented in presence of her husband), RPR, TSH, B12 all WNL., B1 level pending this am  - PT/OT for right foot fracture in conjunction with vestibular rehab if needed which can be done again as an outpatient . At discharge no vertigo. HTN/HLD and possible CAD given Q-waves on ECG: Blood pressure and pulse elevated initially, likely due to severe pain. Denied chest pain. Good control of BP during this hospitalization. Continue to control pain .   - continue low dose beta blocker  - Continue statin    Chronic bronchitis: Low lung volumes on CXR, on room air. Stable. At discharge pt maintaining sats >90 on  room air. Continue advair - Incentive spirometry  Continue oral steroids.   Steroid dependence (likely iatrogenic adrenal insufficiency):  - Stress dose steroids perioperatively: Hydrocortisone 25 mg every 8h on date of surgery, and for 2 doses afterwards  - resume home prednisone 10/27/12. Will  be discharged on home regimen.   GERD, stable. Continue PPI : diet advanced to bland.   Osteoporosis, may be due to chronic steroids and now with ankle fracture  - Vitamin D level WNL  -Will need OP follow up with PCP     Procedures:  ORIF rt ankle 10/25/12  Consultations:  Orthopedics  Discharge Exam: Filed Vitals:   10/27/12 2107 10/27/12 2111 10/28/12 0437 10/28/12 1027  BP:  135/74 130/66   Pulse:  77 71   Temp:   99 F (37.2 C)   TempSrc:   Oral   Resp:   18   Height:      Weight:   88.4 kg (194 lb 14.2 oz)   SpO2: 97%  95% 97%    General: awake alert calm NAD Cardiovascular: RRR NO MGR  Respiratory: normal effort BSCTAB No wheeze  Discharge Instructions      Discharge Orders    Future Appointments: Provider: Department: Dept Phone: Center:   11/17/2012 2:10 PM Robyne Askew, MD [facility], [place] [phone] None     Future Orders Please Complete By Expires   Diet - low sodium heart healthy      Non weight bearing      Comments:   Right leg   Increase activity slowly          Medication List     As of 10/28/2012 10:44 AM    TAKE these medications         acetaminophen 325 MG tablet   Commonly known as: TYLENOL   Take 1-2 tablets (325-650 mg total) by mouth every 6 (six) hours as needed.      ADVAIR DISKUS 250-50 MCG/DOSE Aepb   Generic drug: Fluticasone-Salmeterol   Inhale 1 puff into the lungs every 12 (twelve) hours.      amitriptyline 10 MG tablet   Commonly known as: ELAVIL   Take 10 mg by mouth daily.      aspirin 81 MG tablet   Take 81 mg by mouth daily.      bisacodyl 10 MG suppository   Commonly known as: DULCOLAX   Place 1 suppository (10 mg total) rectally daily as needed.      Vitamin D-3 5000 UNITS Tabs   Take 1 tablet by mouth daily.      cholecalciferol 1000 UNITS tablet   Commonly known as: VITAMIN D   Take 5 tablets (5,000 Units total) by mouth daily.      ciprofloxacin 500 MG tablet   Commonly known  as: CIPRO   Take 1 tablet (500 mg total) by mouth 2 (two) times daily.      DSS 100 MG Caps   Take 100 mg by mouth 2 (two) times daily.      enoxaparin 40 MG/0.4ML injection   Commonly known as: LOVENOX   Inject 0.4 mLs (40 mg total) into the skin daily.      HYDROcodone-acetaminophen 5-325 MG per tablet   Commonly known as: NORCO/VICODIN   Take 1-2 tablets by mouth every 6 (six) hours as needed for pain.      losartan-hydrochlorothiazide 100-25 MG per tablet   Commonly known as: HYZAAR   Take 1 tablet  by mouth daily.      metoprolol tartrate 12.5 mg Tabs   Commonly known as: LOPRESSOR   Take 0.5 tablets (12.5 mg total) by mouth 2 (two) times daily.      multivitamin capsule   Take 1 capsule by mouth daily.      omeprazole 20 MG capsule   Commonly known as: PRILOSEC   Take 20 mg by mouth 2 (two) times daily.      oxybutynin 5 MG tablet   Commonly known as: DITROPAN   Take 5 mg by mouth daily.      polyethylene glycol packet   Commonly known as: MIRALAX / GLYCOLAX   Take 17 g by mouth daily.      pravastatin 40 MG tablet   Commonly known as: PRAVACHOL   Take 40 mg by mouth daily.      predniSONE 5 MG tablet   Commonly known as: DELTASONE   Take 10 mg by mouth daily.      traMADol 50 MG tablet   Commonly known as: ULTRAM   Take 50 mg by mouth 2 (two) times daily as needed. For coughing        Follow-up Information    Follow up with HANDY,MICHAEL H, MD. Schedule an appointment as soon as possible for a visit in 7 days.   Contact information:   [address] [place] [address] [phone]       Schedule an appointment as soon as possible for a visit with Nadean Corwin, MD.   Contact information:   [address] [place] [address] [phone]           The results of significant diagnostics from this hospitalization (including imaging, microbiology, ancillary and laboratory) are listed  below for reference.    Significant Diagnostic Studies: Dg Ankle 2 Views Right  October 29, 2012  *RADIOLOGY REPORT*  Clinical Data: Post reduction  RIGHT ANKLE - 2 VIEW  Comparison: 904 this morning  Findings: Detail obscured by cast.  Oblique fracture distal fibula appears unchanged and remains mildly displaced.  There is reduced displacement of medial malleolar fracture.  There is decreased widening of the mortise medially.  There remains a displaced posterior malleolar fracture with minimal decrease in this placement when compared to prior study.  IMPRESSION: Trimalleolar fracture with significant displacement, mildly decreased displacement of some fragments when compared to prior study.   Original Report Authenticated By: Esperanza Heir, M.D.    Dg Ankle Complete Right  2012-10-29  *RADIOLOGY REPORT*  Clinical Data: Fracture fixation.  DG C-ARM 61-120 MIN, RIGHT ANKLE - COMPLETE 3+ VIEW  Technique: Four fluoroscopic spot views of the right ankle are provided.  Comparison:  CT scan and plain films 2012-10-29.  Findings: Four fluoroscopic spot views are provided and demonstrate placement of syndesmotic screw and screw in the medial malleolus. External fixator placement is also identified.  IMPRESSION: ORIF ankle fracture and external fixator placement as above.   Original Report Authenticated By: Holley Dexter, M.D.    Ct Ankle Right Wo Contrast  2012-10-29  *RADIOLOGY REPORT*  Clinical Data: Fracture dislocation of the right ankle.  CT OF THE RIGHT ANKLE WITH CONTRAST  Technique:  Multidetector CT imaging was performed following the standard protocol during bolus administration of intravenous contrast.  Comparison: Radiographs dated Oct 29, 2012  Findings: The fracture dislocation has been reduced.  Posterior malleolar fracture involves only a small segment of the articular surface of the distal tibia. There is approximately 5  mm of distraction and 4 mm of overriding.  There is 6 mm of distraction of the  horizontal fracture through the medial malleolus.  The distal tibiofibular syndesmosis is disrupted.  There is an avulsion of bone from the lateral aspect of the distal tibia at the insertion of the syndesmosis and this fragment is displaced laterally between a fragment of the distal fibula and the site of origin of this avulsion.  In addition, the tibial attachment of the syndesmosis is avulsed with its osseous insertion and is displaced anterior to its normal position.  There is a small avulsion of bone from the proximal dorsal aspect of the cuboid adjacent to the anterior process of the calcaneus.  There is no evidence of entrapped tendons at the ankle joint. Talus is intact.  Calcaneus is intact.  IMPRESSION:  1.  Disruption of the distal tibial fibular syndesmosis.  The tibial insertion of the syndesmosis is avulsed with a bone fragment which is trapped between a distal fibular fragment and the origin of the avulsed tibial fragment.  This is best seen on image number 16 of series 2.  There is also subluxation anteriorly of the fibular fragment with respect to the normal position at the syndesmosis. 2.  Slightly distracted medial and posterior malleolar fractures of the distal tibia as described. 3.  Small avulsion fracture of the dorsal proximal aspect of the cuboid.   Original Report Authenticated By: Francene Boyers, M.D.    Ct Foot Right Wo Contrast  10/25/2012  *RADIOLOGY REPORT*  Clinical Data: Fracture dislocation of the right ankle.  CT OF THE RIGHT FOOT WITHOUT CONTRAST  Technique:  Multidetector CT imaging was performed according to the standard protocol. Multiplanar CT image reconstructions were also generated.  Comparison: Radiographs dated 10/25/2012  Findings: There is a small avulsion fracture of the dorsal proximal aspect of the cuboid adjacent to the anterior process of the calcaneus.  The other bones of the foot are intact.  Ankle fractures are described on the CT scan of the ankle of the same  date.  Slight arthritis of the head of the first metatarsal.  IMPRESSION: Small avulsion from the dorsal proximal aspect of the cuboid adjacent to the anterior process of the calcaneus.   Original Report Authenticated By: Francene Boyers, M.D.    Dg Chest Portable 1 View  10/25/2012  *RADIOLOGY REPORT*  Clinical Data: Fall.  Preop.  PORTABLE CHEST - 1 VIEW  Comparison: 02/12/2012  Findings: Low lung volumes.  Normal heart size.  Clear lungs.  No pneumothorax.  No obvious acute bony deformity.  IMPRESSION: No active cardiopulmonary disease.   Original Report Authenticated By: Jolaine Click, M.D.    Dg Ankle Right Port  10/25/2012  *RADIOLOGY REPORT*  Clinical Data: Status post fracture fixation.  PORTABLE RIGHT ANKLE - 2 VIEW  Comparison: CT and plain films earlier this same date.  Findings: A single lag screw is seen in the medial malleolus. Syndesmotic screw is also in place.  Distal fibular fracture is again identified.  The patient is in a external fixator. The tibiotalar joint has been reduced.  IMPRESSION: ORIF ankle fractures and placement of an external fixator.  No acute finding.   Original Report Authenticated By: Holley Dexter, M.D.    Dg Ankle Right Port  10/25/2012  *RADIOLOGY REPORT*  Clinical Data: Fracture.  Postreduction.  PORTABLE RIGHT ANKLE - 2 VIEW  Comparison: None.  Findings: Trimalleolar fracture again noted.  Alignment is significantly improved but not anatomic, with 9 mm  of separation between the medial tibial plafond and the medial talus but only 2 mm of separation laterally, and 6 mm posterior displacement of the inferior fragment of the lateral malleolar fracture with respect to the superior.  No new fracture or specific complicating feature is observed.  A fiberglass splint noted.  IMPRESSION:  1.  Significantly improved but not anatomic alignment, with mild persistent medial displacement of the tibia with respect to the talus, and mild separation of the lateral malleolar oblique  fracture site.  Interval fiberglass splinting.   Original Report Authenticated By: Gaylyn Rong, M.D.    Dg Ankle Right Port  10/25/2012  *RADIOLOGY REPORT*  Clinical Data: Ankle pain post fall  PORTABLE RIGHT ANKLE - 2 VIEW  Comparison: Portable exam 0820 hours without priors for comparison.  Findings: Fracture-dislocation right ankle: Lateral dislocation of talus from tibiotalar joint. Oblique distal fibular fracture displaced laterally with mild apex medial angulation. Fracture medial malleolus displaced laterally. Displaced posterior malleolar fracture fragment.  Tarsals grossly intact. Bones appear demineralized.  IMPRESSION: Trimalleolar fracture-dislocation right ankle.   Original Report Authenticated By: Ulyses Southward, M.D.    Dg C-arm [id number] Min  10/25/2012  *RADIOLOGY REPORT*  Clinical Data: Fracture fixation.  DG C-ARM 61-120 MIN, RIGHT ANKLE - COMPLETE 3+ VIEW  Technique: Four fluoroscopic spot views of the right ankle are provided.  Comparison:  CT scan and plain films 10/25/12.  Findings: Four fluoroscopic spot views are provided and demonstrate placement of syndesmotic screw and screw in the medial malleolus. External fixator placement is also identified.  IMPRESSION: ORIF ankle fracture and external fixator placement as above.   Original Report Authenticated By: Holley Dexter, M.D.     Microbiology: Recent Results (from the past 240 hour(s))  SURGICAL PCR SCREEN     Status: Normal   Collection Time   10/25/12  5:01 PM      Component Value Range Status Comment   MRSA, PCR NEGATIVE  NEGATIVE Final    Staphylococcus aureus NEGATIVE  NEGATIVE Final   URINE CULTURE     Status: Normal   Collection Time   10/26/12  4:54 PM      Component Value Range Status Comment   Specimen Description URINE, CLEAN CATCH   Final    Special Requests NONE   Final    Culture  Setup Time 10/27/2012 02:00   Final    Colony Count NO GROWTH   Final    Culture NO GROWTH   Final    Report Status 10/27/2012  FINAL   Final      Labs: Basic Metabolic Panel:  Lab 10/28/12 4098 10/27/12 0635 10/26/12 0640 10/25/12 0808  NA 141 142 138 139  K 3.5 3.6 3.1* 3.1*  CL 106 106 100 99  CO2 26 26 26 29   GLUCOSE 105* 118* 172* 108*  BUN 14 16 17 22   CREATININE 0.85 0.90 0.97 1.06  CALCIUM 8.4 8.4 8.3* 8.9  MG -- -- -- --  PHOS -- -- -- --   Liver Function Tests: No results found for this basename: AST:5,ALT:5,ALKPHOS:5,BILITOT:5,PROT:5,ALBUMIN:5 in the last 168 hours No results found for this basename: LIPASE:5,AMYLASE:5 in the last 168 hours No results found for this basename: AMMONIA:5 in the last 168 hours CBC:  Lab 10/28/12 0605 10/26/12 0640 10/25/12 0808  WBC 9.0 10.4 10.2  NEUTROABS -- -- 5.9  HGB 10.4* 12.1 14.4  HCT 32.2* 36.9 42.5  MCV 96.7 94.9 92.8  PLT 211 227 257   Cardiac Enzymes:  Lab 10/25/12 0821  CKTOTAL --  CKMB --  CKMBINDEX --  TROPONINI <0.30   BNP: BNP (last 3 results) No results found for this basename: PROBNP:3 in the last 8760 hours CBG: No results found for this basename: GLUCAP:5 in the last 168 hours     Signed:  Gwenyth Bender  [facility] 10/28/2012, 10:44 AM

## 2012-10-28 NOTE — Progress Notes (Signed)
Physical Therapy Treatment Patient Details Name: Marie [name] MRN: [id number] DOB: 07/30/1936 Today's Date: 10/28/2012 Time: 1201-1225 PT Time Calculation (min): 24 min  PT Assessment / Plan / Recommendation Comments on Treatment Session  Pt adm with rt ankle fx and underwent placement of external fixator.  Pt making steady but slow progress.  Agree with plan for ST-SNF.    Follow Up Recommendations  SNF     Does the patient have the potential to tolerate intense rehabilitation     Barriers to Discharge        Equipment Recommendations  None recommended by PT    Recommendations for Other Services    Frequency Min 5X/week   Plan Discharge plan remains appropriate;Frequency remains appropriate    Precautions / Restrictions Precautions Precautions: Fall Restrictions RLE Weight Bearing: Non weight bearing   Pertinent Vitals/Pain Denies pain.    Mobility  Transfers Sit to Stand: 1: +2 Total assist;With upper extremity assist;With armrests;From bed;From chair/3-in-1 Sit to Stand: Patient Percentage: 60% Stand to Sit: 1: +2 Total assist;Without upper extremity assist;To chair/3-in-1 Stand Pivot Transfers: 3: Mod assist (+1 for safety) Details for Transfer Assistance: Performed sit to stand from chair x 3 with walker.  Performed bed to chair transfer without assistive device and holding onto my forearms.    Exercises General Exercises - Upper Extremity Shoulder Flexion: Strengthening;Both;10 reps;Seated General Exercises - Lower Extremity Ankle Circles/Pumps: AROM;Left;10 reps;Seated Quad Sets: Strengthening;10 reps;Both;Seated   PT Diagnosis:    PT Problem List:   PT Treatment Interventions:     PT Goals Acute Rehab PT Goals PT Goal: Sit to Stand - Progress: Progressing toward goal PT Goal: Stand to Sit - Progress: Progressing toward goal PT Transfer Goal: Bed to Chair/Chair to Bed - Progress: Progressing toward goal PT Goal: Stand - Progress: Progressing toward  goal  Visit Information  Last PT Received On: 10/28/12 Assistance Needed: +2    Subjective Data  Subjective: Pt states she is supposed to leave today.l   Cognition  Overall Cognitive Status: Appears within functional limits for tasks assessed/performed Arousal/Alertness: Awake/alert Orientation Level: Appears intact for tasks assessed Behavior During Session: [facility] for tasks performed    Balance  Static Standing Balance Static Standing - Balance Support: Bilateral upper extremity supported (on walker) Static Standing - Level of Assistance: 4: Min assist;3: Mod assist Static Standing - Comment/# of Minutes: Stood 3 times for 30 to 60 seconds.  Initially required mod A due to balance.  Needs verbal cues to bring rt leg in front to improve balance and keep rt leg in front of chair.  End of Session PT - End of Session Equipment Utilized During Treatment: Gait belt Activity Tolerance: Patient tolerated treatment well Patient left: in chair;with call bell/phone within reach Nurse Communication: Mobility status   GP     Ledarius Leeson 10/28/2012, 1:37 PM  [facility] PT [phone]

## 2012-11-02 DIAGNOSIS — S9306XA Dislocation of unspecified ankle joint, initial encounter: Secondary | ICD-10-CM | POA: Diagnosis not present

## 2012-11-02 DIAGNOSIS — S82853A Displaced trimalleolar fracture of unspecified lower leg, initial encounter for closed fracture: Secondary | ICD-10-CM | POA: Diagnosis not present

## 2012-11-04 NOTE — Consult Note (Signed)
I saw and examined the patient with Mr. Renae Fickle, communicating the findings and plan noted above, with him scribing.  Please see notes for procedures.  Myrene Galas, MD [facility] [phone] (p)

## 2012-11-04 NOTE — Progress Notes (Signed)
I saw and examined the patient with Mr. Paul, communicating the findings and plan noted above.  Davanta Meuser, MD [facility] [phone] (p)  

## 2012-11-04 NOTE — Progress Notes (Signed)
I have seen and examined the patient. I agree with the findings above.  Budd Palmer, MD  3:35 PM

## 2012-11-16 DIAGNOSIS — S9306XA Dislocation of unspecified ankle joint, initial encounter: Secondary | ICD-10-CM | POA: Diagnosis not present

## 2012-11-16 DIAGNOSIS — S93439A Sprain of tibiofibular ligament of unspecified ankle, initial encounter: Secondary | ICD-10-CM | POA: Diagnosis not present

## 2012-11-16 DIAGNOSIS — S82853A Displaced trimalleolar fracture of unspecified lower leg, initial encounter for closed fracture: Secondary | ICD-10-CM | POA: Diagnosis not present

## 2012-11-17 ENCOUNTER — Encounter (INDEPENDENT_AMBULATORY_CARE_PROVIDER_SITE_OTHER): Payer: Medicare Other | Admitting: General Surgery

## 2012-11-30 DIAGNOSIS — S82853A Displaced trimalleolar fracture of unspecified lower leg, initial encounter for closed fracture: Secondary | ICD-10-CM | POA: Diagnosis not present

## 2012-11-30 DIAGNOSIS — S9306XA Dislocation of unspecified ankle joint, initial encounter: Secondary | ICD-10-CM | POA: Diagnosis not present

## 2012-12-02 DIAGNOSIS — J449 Chronic obstructive pulmonary disease, unspecified: Secondary | ICD-10-CM | POA: Diagnosis not present

## 2012-12-02 DIAGNOSIS — L8991 Pressure ulcer of unspecified site, stage 1: Secondary | ICD-10-CM | POA: Diagnosis not present

## 2012-12-02 DIAGNOSIS — IMO0001 Reserved for inherently not codable concepts without codable children: Secondary | ICD-10-CM | POA: Diagnosis not present

## 2012-12-02 DIAGNOSIS — M810 Age-related osteoporosis without current pathological fracture: Secondary | ICD-10-CM | POA: Diagnosis not present

## 2012-12-02 DIAGNOSIS — L89609 Pressure ulcer of unspecified heel, unspecified stage: Secondary | ICD-10-CM | POA: Diagnosis not present

## 2012-12-02 DIAGNOSIS — G609 Hereditary and idiopathic neuropathy, unspecified: Secondary | ICD-10-CM | POA: Diagnosis not present

## 2012-12-07 DIAGNOSIS — S82853A Displaced trimalleolar fracture of unspecified lower leg, initial encounter for closed fracture: Secondary | ICD-10-CM | POA: Diagnosis not present

## 2012-12-08 DIAGNOSIS — J449 Chronic obstructive pulmonary disease, unspecified: Secondary | ICD-10-CM | POA: Diagnosis not present

## 2012-12-08 DIAGNOSIS — G609 Hereditary and idiopathic neuropathy, unspecified: Secondary | ICD-10-CM | POA: Diagnosis not present

## 2012-12-08 DIAGNOSIS — IMO0001 Reserved for inherently not codable concepts without codable children: Secondary | ICD-10-CM | POA: Diagnosis not present

## 2012-12-08 DIAGNOSIS — L89609 Pressure ulcer of unspecified heel, unspecified stage: Secondary | ICD-10-CM | POA: Diagnosis not present

## 2012-12-08 DIAGNOSIS — L8991 Pressure ulcer of unspecified site, stage 1: Secondary | ICD-10-CM | POA: Diagnosis not present

## 2012-12-08 DIAGNOSIS — M810 Age-related osteoporosis without current pathological fracture: Secondary | ICD-10-CM | POA: Diagnosis not present

## 2012-12-09 DIAGNOSIS — G609 Hereditary and idiopathic neuropathy, unspecified: Secondary | ICD-10-CM | POA: Diagnosis not present

## 2012-12-09 DIAGNOSIS — L89609 Pressure ulcer of unspecified heel, unspecified stage: Secondary | ICD-10-CM | POA: Diagnosis not present

## 2012-12-09 DIAGNOSIS — IMO0001 Reserved for inherently not codable concepts without codable children: Secondary | ICD-10-CM | POA: Diagnosis not present

## 2012-12-09 DIAGNOSIS — M810 Age-related osteoporosis without current pathological fracture: Secondary | ICD-10-CM | POA: Diagnosis not present

## 2012-12-09 DIAGNOSIS — L8991 Pressure ulcer of unspecified site, stage 1: Secondary | ICD-10-CM | POA: Diagnosis not present

## 2012-12-20 DIAGNOSIS — R490 Dysphonia: Secondary | ICD-10-CM | POA: Diagnosis not present

## 2012-12-21 DIAGNOSIS — S82853A Displaced trimalleolar fracture of unspecified lower leg, initial encounter for closed fracture: Secondary | ICD-10-CM | POA: Diagnosis not present

## 2012-12-21 DIAGNOSIS — S9306XA Dislocation of unspecified ankle joint, initial encounter: Secondary | ICD-10-CM | POA: Diagnosis not present

## 2013-01-10 ENCOUNTER — Encounter (INDEPENDENT_AMBULATORY_CARE_PROVIDER_SITE_OTHER): Payer: Self-pay | Admitting: General Surgery

## 2013-01-10 ENCOUNTER — Ambulatory Visit (INDEPENDENT_AMBULATORY_CARE_PROVIDER_SITE_OTHER): Payer: Medicare Other | Admitting: General Surgery

## 2013-01-10 VITALS — BP 104/72 | HR 76 | Temp 97.6°F | Resp 16 | Ht 64.0 in | Wt 187.0 lb

## 2013-01-10 DIAGNOSIS — D129 Benign neoplasm of anus and anal canal: Secondary | ICD-10-CM | POA: Diagnosis not present

## 2013-01-10 DIAGNOSIS — D128 Benign neoplasm of rectum: Secondary | ICD-10-CM

## 2013-01-11 DIAGNOSIS — S82853A Displaced trimalleolar fracture of unspecified lower leg, initial encounter for closed fracture: Secondary | ICD-10-CM | POA: Diagnosis not present

## 2013-01-12 ENCOUNTER — Encounter (INDEPENDENT_AMBULATORY_CARE_PROVIDER_SITE_OTHER): Payer: Self-pay | Admitting: General Surgery

## 2013-01-12 NOTE — Progress Notes (Signed)
Subjective:     Patient ID: Marie [name], female   DOB: 1936-05-31, 77 y.o.   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 She has done well since her last visit. She denies any rectal pain or rectal bleeding. Her appetite is good.  Review of Systems  Constitutional: Negative.   HENT: Negative.   Eyes: Negative.   Respiratory: Negative.   Cardiovascular: Negative.   Gastrointestinal: Negative.   Endocrine: Negative.   Genitourinary: Negative.   Musculoskeletal: Negative.   Skin: Negative.   Allergic/Immunologic: Negative.   Neurological: Negative.   Hematological: Negative.   Psychiatric/Behavioral: Negative.        Objective:   Physical Exam  Constitutional: She is oriented to person, place, and time. She appears well-developed and well-nourished.  HENT:  Head: Normocephalic and atraumatic.  Eyes: Conjunctivae and EOM are normal. Pupils are equal, round, and reactive to light.  Neck: Normal range of motion. Neck supple.  Cardiovascular: Normal rate, regular rhythm and normal heart sounds.   Pulmonary/Chest: Effort normal and breath sounds normal.  Abdominal: Soft. Bowel sounds are normal.  Genitourinary:  Her perirectal skin looks good. On digital exam I do not palpate any mass. On anoscopic exam I see no evidence for recurrence of the adenoma at this point  Musculoskeletal: Normal range of motion.  Neurological: She is alert and oriented to person, place, and time.  Skin: Skin is warm and dry.  Psychiatric: She has a normal mood and affect. Her behavior is normal.       Assessment:     The patient is one year and 3 months status post resection of a tubulovillous adenoma from her anal verge     Plan:     At this point we see no evidence of recurrence. We will plan to see her back in about 3 or 4 months for a recheck

## 2013-01-18 DIAGNOSIS — I10 Essential (primary) hypertension: Secondary | ICD-10-CM | POA: Diagnosis not present

## 2013-01-18 DIAGNOSIS — R7309 Other abnormal glucose: Secondary | ICD-10-CM | POA: Diagnosis not present

## 2013-01-18 DIAGNOSIS — E782 Mixed hyperlipidemia: Secondary | ICD-10-CM | POA: Diagnosis not present

## 2013-01-18 DIAGNOSIS — Z79899 Other long term (current) drug therapy: Secondary | ICD-10-CM | POA: Diagnosis not present

## 2013-01-18 DIAGNOSIS — E559 Vitamin D deficiency, unspecified: Secondary | ICD-10-CM | POA: Diagnosis not present

## 2013-02-08 DIAGNOSIS — S9306XA Dislocation of unspecified ankle joint, initial encounter: Secondary | ICD-10-CM | POA: Diagnosis not present

## 2013-02-08 DIAGNOSIS — S82853A Displaced trimalleolar fracture of unspecified lower leg, initial encounter for closed fracture: Secondary | ICD-10-CM | POA: Diagnosis not present

## 2013-02-08 DIAGNOSIS — IMO0001 Reserved for inherently not codable concepts without codable children: Secondary | ICD-10-CM | POA: Diagnosis not present

## 2013-02-08 DIAGNOSIS — S93439A Sprain of tibiofibular ligament of unspecified ankle, initial encounter: Secondary | ICD-10-CM | POA: Diagnosis not present

## 2013-02-21 DIAGNOSIS — N3000 Acute cystitis without hematuria: Secondary | ICD-10-CM | POA: Diagnosis not present

## 2013-02-21 DIAGNOSIS — I10 Essential (primary) hypertension: Secondary | ICD-10-CM | POA: Diagnosis not present

## 2013-02-21 DIAGNOSIS — J449 Chronic obstructive pulmonary disease, unspecified: Secondary | ICD-10-CM | POA: Diagnosis not present

## 2013-02-21 DIAGNOSIS — E782 Mixed hyperlipidemia: Secondary | ICD-10-CM | POA: Diagnosis not present

## 2013-03-27 ENCOUNTER — Telehealth (INDEPENDENT_AMBULATORY_CARE_PROVIDER_SITE_OTHER): Payer: Self-pay

## 2013-03-27 DIAGNOSIS — N3000 Acute cystitis without hematuria: Secondary | ICD-10-CM | POA: Diagnosis not present

## 2013-03-27 DIAGNOSIS — N760 Acute vaginitis: Secondary | ICD-10-CM | POA: Diagnosis not present

## 2013-03-27 NOTE — Telephone Encounter (Signed)
Patient called reporting Dr Carolynne Edouard follows her for a rectal tubovillous adenoma.  She wants a sooner appointment because she started having vaginal bleeding.  It started yesterday.  Sometimes it has been heavy and other times spotting.  I asked if she has a gyn and she doesn't.  I told her it may not be related to check with her PCP 1st and they may want to check her.  She will call us back if she needs an appointment sooner still.

## 2013-04-04 DIAGNOSIS — R55 Syncope and collapse: Secondary | ICD-10-CM | POA: Diagnosis not present

## 2013-04-04 DIAGNOSIS — E782 Mixed hyperlipidemia: Secondary | ICD-10-CM | POA: Diagnosis not present

## 2013-04-04 DIAGNOSIS — I10 Essential (primary) hypertension: Secondary | ICD-10-CM | POA: Diagnosis not present

## 2013-04-06 DIAGNOSIS — M25569 Pain in unspecified knee: Secondary | ICD-10-CM | POA: Diagnosis not present

## 2013-04-07 DIAGNOSIS — S838X9A Sprain of other specified parts of unspecified knee, initial encounter: Secondary | ICD-10-CM | POA: Diagnosis not present

## 2013-04-07 DIAGNOSIS — S86819A Strain of other muscle(s) and tendon(s) at lower leg level, unspecified leg, initial encounter: Secondary | ICD-10-CM | POA: Diagnosis not present

## 2013-04-10 ENCOUNTER — Ambulatory Visit (INDEPENDENT_AMBULATORY_CARE_PROVIDER_SITE_OTHER): Payer: Medicare Other | Admitting: General Surgery

## 2013-04-17 ENCOUNTER — Other Ambulatory Visit (HOSPITAL_COMMUNITY): Payer: Self-pay | Admitting: Internal Medicine

## 2013-04-17 DIAGNOSIS — Z1231 Encounter for screening mammogram for malignant neoplasm of breast: Secondary | ICD-10-CM

## 2013-04-20 ENCOUNTER — Inpatient Hospital Stay (HOSPITAL_COMMUNITY)
Admission: EM | Admit: 2013-04-20 | Discharge: 2013-04-22 | DRG: 195 | Disposition: A | Discharge status: Home Health | Payer: Medicare Other | Attending: Internal Medicine | Admitting: Internal Medicine

## 2013-04-20 ENCOUNTER — Encounter (HOSPITAL_COMMUNITY): Payer: Self-pay

## 2013-04-20 ENCOUNTER — Emergency Department (HOSPITAL_COMMUNITY): Payer: Medicare Other

## 2013-04-20 DIAGNOSIS — J189 Pneumonia, unspecified organism: Principal | ICD-10-CM

## 2013-04-20 DIAGNOSIS — I10 Essential (primary) hypertension: Secondary | ICD-10-CM | POA: Diagnosis not present

## 2013-04-20 DIAGNOSIS — G589 Mononeuropathy, unspecified: Secondary | ICD-10-CM | POA: Diagnosis present

## 2013-04-20 DIAGNOSIS — E785 Hyperlipidemia, unspecified: Secondary | ICD-10-CM | POA: Diagnosis present

## 2013-04-20 DIAGNOSIS — R0602 Shortness of breath: Secondary | ICD-10-CM | POA: Diagnosis not present

## 2013-04-20 DIAGNOSIS — K219 Gastro-esophageal reflux disease without esophagitis: Secondary | ICD-10-CM | POA: Diagnosis present

## 2013-04-20 DIAGNOSIS — R269 Unspecified abnormalities of gait and mobility: Secondary | ICD-10-CM | POA: Diagnosis present

## 2013-04-20 DIAGNOSIS — R296 Repeated falls: Secondary | ICD-10-CM

## 2013-04-20 DIAGNOSIS — R2689 Other abnormalities of gait and mobility: Secondary | ICD-10-CM

## 2013-04-20 DIAGNOSIS — R55 Syncope and collapse: Secondary | ICD-10-CM | POA: Diagnosis not present

## 2013-04-20 DIAGNOSIS — E860 Dehydration: Secondary | ICD-10-CM | POA: Diagnosis not present

## 2013-04-20 DIAGNOSIS — Z79899 Other long term (current) drug therapy: Secondary | ICD-10-CM | POA: Diagnosis not present

## 2013-04-20 DIAGNOSIS — Z9181 History of falling: Secondary | ICD-10-CM

## 2013-04-20 DIAGNOSIS — R29818 Other symptoms and signs involving the nervous system: Secondary | ICD-10-CM | POA: Diagnosis not present

## 2013-04-20 DIAGNOSIS — R404 Transient alteration of awareness: Secondary | ICD-10-CM | POA: Diagnosis not present

## 2013-04-20 DIAGNOSIS — IMO0002 Reserved for concepts with insufficient information to code with codable children: Secondary | ICD-10-CM | POA: Diagnosis not present

## 2013-04-20 DIAGNOSIS — E876 Hypokalemia: Secondary | ICD-10-CM | POA: Diagnosis present

## 2013-04-20 DIAGNOSIS — I959 Hypotension, unspecified: Secondary | ICD-10-CM | POA: Diagnosis present

## 2013-04-20 DIAGNOSIS — J42 Unspecified chronic bronchitis: Secondary | ICD-10-CM

## 2013-04-20 DIAGNOSIS — R5381 Other malaise: Secondary | ICD-10-CM | POA: Diagnosis not present

## 2013-04-20 LAB — GLUCOSE, CAPILLARY: Glucose-Capillary: 92 mg/dL (ref 70–99)

## 2013-04-20 LAB — URINALYSIS, ROUTINE W REFLEX MICROSCOPIC
Glucose, UA: NEGATIVE mg/dL
Hgb urine dipstick: NEGATIVE
Ketones, ur: 15 mg/dL — AB
Protein, ur: NEGATIVE mg/dL

## 2013-04-20 LAB — POCT I-STAT, CHEM 8
BUN: 23 mg/dL (ref 6–23)
Calcium, Ion: 1.12 mmol/L — ABNORMAL LOW (ref 1.13–1.30)
Chloride: 101 mEq/L (ref 96–112)
Creatinine, Ser: 1.4 mg/dL — ABNORMAL HIGH (ref 0.50–1.10)
Glucose, Bld: 96 mg/dL (ref 70–99)
HCT: 53 % — ABNORMAL HIGH (ref 36.0–46.0)
Hemoglobin: 18 g/dL — ABNORMAL HIGH (ref 12.0–15.0)
Potassium: 3.6 meq/L (ref 3.5–5.1)
Sodium: 138 meq/L (ref 135–145)
TCO2: 28 mmol/L (ref 0–100)

## 2013-04-20 LAB — URINE MICROSCOPIC-ADD ON

## 2013-04-20 LAB — COMPREHENSIVE METABOLIC PANEL
ALT: 20 U/L (ref 0–35)
CO2: 26 mEq/L (ref 19–32)
Calcium: 9.4 mg/dL (ref 8.4–10.5)
Creatinine, Ser: 1.19 mg/dL — ABNORMAL HIGH (ref 0.50–1.10)
GFR calc Af Amer: 51 mL/min — ABNORMAL LOW (ref >90)
GFR calc non Af Amer: 44 mL/min — ABNORMAL LOW (ref >90)
Glucose, Bld: 93 mg/dL (ref 70–99)
Sodium: 137 mEq/L (ref 135–145)
Total Bilirubin: 0.8 mg/dL (ref 0.3–1.2)

## 2013-04-20 LAB — DIFFERENTIAL
Eosinophils Relative: 2 % (ref 0–5)
Lymphocytes Relative: 23 % (ref 12–46)
Lymphs Abs: 2.3 10*3/uL (ref 0.7–4.0)
Monocytes Absolute: 0.8 10*3/uL (ref 0.1–1.0)

## 2013-04-20 LAB — CBC
HCT: 50.1 % — ABNORMAL HIGH (ref 36.0–46.0)
HCT: 47.3 % — ABNORMAL HIGH (ref 36.0–46.0)
Hemoglobin: 17.7 g/dL — ABNORMAL HIGH (ref 12.0–15.0)
MCH: 31.3 pg (ref 26.0–34.0)
MCHC: 34 g/dL (ref 30.0–36.0)
MCV: 91.4 fL (ref 78.0–100.0)
MCV: 91.8 fL (ref 78.0–100.0)
Platelets: 275 10*3/uL (ref 150–400)
RBC: 5.48 MIL/uL — ABNORMAL HIGH (ref 3.87–5.11)
RDW: 13.7 % (ref 11.5–15.5)
WBC: 10.4 10*3/uL (ref 4.0–10.5)

## 2013-04-20 LAB — CG4 I-STAT (LACTIC ACID): Lactic Acid, Venous: 1.94 mmol/L (ref 0.5–2.2)

## 2013-04-20 MED ORDER — DEXTROSE 5 % IV SOLN
1.0000 g | Freq: Once | INTRAVENOUS | Status: AC
  Administered 2013-04-20: 1 g via INTRAVENOUS
  Filled 2013-04-20: qty 10

## 2013-04-20 MED ORDER — ENOXAPARIN SODIUM 40 MG/0.4ML ~~LOC~~ SOLN
40.0000 mg | SUBCUTANEOUS | Status: DC
  Administered 2013-04-20 – 2013-04-21 (×2): 40 mg via SUBCUTANEOUS
  Filled 2013-04-20 (×3): qty 0.4

## 2013-04-20 MED ORDER — DEXTROSE 5 % IV SOLN
1.0000 g | INTRAVENOUS | Status: DC
  Administered 2013-04-20 – 2013-04-21 (×2): 1 g via INTRAVENOUS
  Filled 2013-04-20 (×3): qty 10

## 2013-04-20 MED ORDER — AZITHROMYCIN 250 MG PO TABS
500.0000 mg | ORAL_TABLET | Freq: Once | ORAL | Status: AC
  Administered 2013-04-20: 500 mg via ORAL
  Filled 2013-04-20: qty 2

## 2013-04-20 MED ORDER — SODIUM CHLORIDE 0.9 % IV BOLUS (SEPSIS)
1000.0000 mL | Freq: Once | INTRAVENOUS | Status: AC
  Administered 2013-04-20: 1000 mL via INTRAVENOUS

## 2013-04-20 MED ORDER — DEXTROSE 5 % IV SOLN
500.0000 mg | INTRAVENOUS | Status: DC
  Administered 2013-04-20 – 2013-04-21 (×2): 500 mg via INTRAVENOUS
  Filled 2013-04-20 (×3): qty 500

## 2013-04-20 MED ORDER — PREDNISONE 50 MG PO TABS
50.0000 mg | ORAL_TABLET | Freq: Every day | ORAL | Status: AC
  Administered 2013-04-21 – 2013-04-22 (×2): 50 mg via ORAL
  Filled 2013-04-20 (×2): qty 1

## 2013-04-20 MED ORDER — SODIUM CHLORIDE 0.9 % IV SOLN
INTRAVENOUS | Status: AC
  Administered 2013-04-20: 17:00:00 via INTRAVENOUS

## 2013-04-20 NOTE — Progress Notes (Signed)
Petrice Beedy Hor [id number] Admission Data: 04/20/2013 6:19 PM Attending Provider: Drema Dallas, MD  JYN:[id number],HYQMVHQ DAVID, MD Consults/ Treatment Team:    Marie [name] is a 77 y.o. female patient admitted from ED awake, alert  & orientated  X 3,  Prior, VSS - Blood pressure 124/45, pulse 83, temperature 98.1 F (36.7 C), temperature source Oral, resp. rate 18, height 5\' 4"  (1.626 m), weight 84.8 kg (186 lb 15.2 oz), SpO2 100.00%.,, no c/o shortness of breath, no c/o chest pain, no distress noted. Tele # [id number] placed and pt is currently running:normal sinus rhythm.   IV site WDL:  antecubital left, condition patent and no redness with a transparent dsg that's clean dry and intact.  Allergies:   Allergies  Allergen Reactions  . Codeine Other (See Comments)    hallucinations  . Demerol Other (See Comments)    hallucinations  . Morphine And Related Other (See Comments)    unknown  . Other Other (See Comments)    Invan 7- pt became red all over  . Sulfate Other (See Comments)    unknown     Past Medical History  Diagnosis Date  . Incontinence   . Osteoarthritis     hands,   . Wears dentures   . Osteoporosis   . Bulging disc   . Hyperlipidemia   . Chronic bronchitis     due to congestion at times, on prednisone and advair  . GERD (gastroesophageal reflux disease)   . Neuropathy     legs stay numb   . Depression     many years ago  . Hypertension     followed by intern med Dr. Kendrick Fries [phone]  . Tubulovillous adenoma of rectum   . Urinary incontinence   . Balance disorder 2008.      Falls a lot.  She can be standing and then leans too far over to one side   . Wears glasses   . Hearing loss     mild  . Iatrogenic adrenal insufficiency     History:  obtained from the patient.   Pt orientation to unit, room and routine. Information packet given to patient/family and safety video watched.  Admission INP armband ID verified with patient/family, and in place. SR  up x 2, fall risk assessment complete with Patient and family verbalizing understanding of risks associated with falls. Pt verbalizes an understanding of how to use the call bell and to call for help before getting out of bed.  Skin, ecchymosis of right arm and buttocks from fall at home.   Will cont to monitor and assist as needed.  Cindra Eves, RN 04/20/2013 6:19 PM

## 2013-04-20 NOTE — ED Provider Notes (Signed)
I saw and evaluated the patient, reviewed the resident's note and I agree with the findings and plan.  I reviewed ECG and agree with resident interpretation.  Pt with weakness, orthostatic and mild cough over the past several weeks.  Pt was seen by PCP 3 times during this time, Dr. Nicola Police.  Pt seen again today, found to be profoundly orthostatic, 70/40 in office.  Also possible facial droop there that pt's family didn't note.  On exam today, no sig appreciable facial droop, no other focal deficits, just generalized fatigue and weakness.  CXR shows possible early LLL pneumonia.  Pt will be treated with IVF's, abx for CAP and admission.  Lungs clear, not hypoxic, No arm drift.  No facial droop.    Gavin Pound. Robb Sibal, MD 04/20/13 1526

## 2013-04-20 NOTE — ED Notes (Signed)
Results of lactic acid shown to Dr. Oni 

## 2013-04-20 NOTE — ED Provider Notes (Signed)
History    CSN: [id number] Arrival date & time 04/20/13  1257  First MD Initiated Contact with Patient 04/20/13 1318     Chief Complaint  Patient presents with  . Fatigue   (Consider location/radiation/quality/duration/timing/severity/associated sxs/prior Treatment) HPI Comments: Marie [name] is a 77 y.o. female presents today for generalized weakness for the past 2 weeks.  Patient has always had a balance issue where she walks with a  Walker, however now she describes symptoms of weakness to where she falls over every other day.  She denies hitting her head or LOC.  Her husband attests to this as well.  She has been seen by Dr. Tamela Oddi 2-3x for this but has not improved.  Today, here PCP noticed a facial droop and transferred the patient to our ED.  She was hypotensive in the office, however EMS and our ED have not gotten any hypotensive recordings.  ROS for this patient is otherwise negative as she is not complaining of anything else besides generalized and worsening weakness.  The history is provided by the patient and the spouse.   Past Medical History  Diagnosis Date  . Incontinence   . Osteoarthritis     hands,   . Wears dentures   . Osteoporosis   . Bulging disc   . Hyperlipidemia   . Chronic bronchitis     due to congestion at times, on prednisone and advair  . GERD (gastroesophageal reflux disease)   . Neuropathy     legs stay numb   . Depression     many years ago  . Hypertension     followed by intern med Dr. Kendrick Fries [phone]  . Tubulovillous adenoma of rectum   . Urinary incontinence   . Balance disorder 2008.      Falls a lot.  She can be standing and then leans too far over to one side   . Wears glasses   . Hearing loss     mild  . Iatrogenic adrenal insufficiency    Past Surgical History  Procedure Laterality Date  . Incontinence surgery      multiple procedures, not cured  . Rectal surgery      by dr. Carolynne Edouard, removal of polyp  . Appendectomy    .  Abdominal hysterectomy    . Tonsillectomy    . Eye surgery      bilateral cataract surgery and lens implant  . Dilation and curettage of uterus    . Rectal biopsy  09/21/2011    Procedure: BIOPSY RECTAL;  Surgeon: Robyne Askew, MD;  Location: [facility] OR;  Service: General;  Laterality: N/A;  3-4 cm  . Finger surgery      fusions and debridements for OA  . External fixation leg  10/25/2012    Procedure: EXTERNAL FIXATION LEG;  Surgeon: Budd Palmer, MD;  Location: [facility] OR;  Service: Orthopedics;  Laterality: Right;   Family History  Problem Relation Age of Onset  . Cancer Sister     pt unaware of what kind  . High blood pressure Mother   . Heart attack Father    History  Substance Use Topics  . Smoking status: Never Smoker   . Smokeless tobacco: Never Used  . Alcohol Use: No   OB History   Grav Para Term Preterm Abortions TAB SAB Ect Mult Living                 Review of Systems 10 Systems reviewed and  are negative for acute change except as noted in the HPI.  Allergies  Codeine; Demerol; Morphine and related; Other; and Sulfate  Home Medications   Current Outpatient Rx  Name  Route  Sig  Dispense  Refill  . acetaminophen (TYLENOL) 325 MG tablet   Oral   Take 1-2 tablets (325-650 mg total) by mouth every 6 (six) hours as needed.   90 tablet   0   . amitriptyline (ELAVIL) 10 MG tablet   Oral   Take 10 mg by mouth daily.          Marland Kitchen aspirin 81 MG tablet   Oral   Take 81 mg by mouth daily.          . bisacodyl (DULCOLAX) 10 MG suppository   Rectal   Place 1 suppository (10 mg total) rectally daily as needed.   12 suppository      . cholecalciferol (VITAMIN D) 1000 UNITS tablet   Oral   Take 5 tablets (5,000 Units total) by mouth daily.         . Cholecalciferol (VITAMIN D-3) 5000 UNITS TABS   Oral   Take 1 tablet by mouth daily.          Marland Kitchen docusate sodium 100 MG CAPS   Oral   Take 100 mg by mouth 2 (two) times daily.   10 capsule      .  EXPIRED: enoxaparin (LOVENOX) 40 MG/0.4ML injection   Subcutaneous   Inject 0.4 mLs (40 mg total) into the skin daily.   19 Syringe   0   . Fluticasone-Salmeterol (ADVAIR DISKUS) 250-50 MCG/DOSE AEPB   Inhalation   Inhale 1 puff into the lungs every 12 (twelve) hours.          Marland Kitchen HYDROcodone-acetaminophen (NORCO/VICODIN) 5-325 MG per tablet   Oral   Take 1-2 tablets by mouth every 6 (six) hours as needed for pain.   60 tablet   0   . losartan-hydrochlorothiazide (HYZAAR) 100-25 MG per tablet   Oral   Take 1 tablet by mouth daily.          . metoprolol tartrate (LOPRESSOR) 12.5 mg TABS   Oral   Take 0.5 tablets (12.5 mg total) by mouth 2 (two) times daily.   30 tablet   0   . Multiple Vitamin (MULTIVITAMIN) capsule   Oral   Take 1 capsule by mouth daily.          Marland Kitchen omeprazole (PRILOSEC) 20 MG capsule   Oral   Take 20 mg by mouth 2 (two) times daily.          Marland Kitchen oxybutynin (DITROPAN) 5 MG tablet   Oral   Take 5 mg by mouth daily.          . polyethylene glycol (MIRALAX / GLYCOLAX) packet   Oral   Take 17 g by mouth daily.   14 each   0   . pravastatin (PRAVACHOL) 40 MG tablet   Oral   Take 40 mg by mouth daily.          . predniSONE (DELTASONE) 5 MG tablet   Oral   Take 10 mg by mouth daily.         . traMADol (ULTRAM) 50 MG tablet   Oral   Take 50 mg by mouth 2 (two) times daily as needed. For coughing          BP 113/66  Pulse 88  Temp(Src) 97.7 F (36.5 C) (  Oral)  Resp 18  SpO2 98% Physical Exam  Nursing note and vitals reviewed. Constitutional: She is oriented to person, place, and time. She appears well-developed and well-nourished. No distress.  HENT:  Head: Normocephalic and atraumatic.  Nose: Nose normal.  Mouth/Throat: Oropharynx is clear and moist. No oropharyngeal exudate.  Eyes: Conjunctivae and EOM are normal. Pupils are equal, round, and reactive to light. No scleral icterus.  Neck: Normal range of motion. Neck supple.  No JVD present. No tracheal deviation present. No thyromegaly present.  Cardiovascular: Normal rate, regular rhythm and normal heart sounds.  Exam reveals no gallop and no friction rub.   No murmur heard. Pulmonary/Chest: Effort normal and breath sounds normal. No stridor. No respiratory distress. She has no wheezes. She exhibits no tenderness.  Abdominal: Soft. Bowel sounds are normal. She exhibits no distension and no mass. There is no tenderness. There is no rebound and no guarding.  Musculoskeletal: Normal range of motion. She exhibits no edema and no tenderness.  Lymphadenopathy:    She has no cervical adenopathy.  Neurological: She is alert and oriented to person, place, and time. She has normal strength. She is not disoriented. No cranial nerve deficit or sensory deficit. GCS eye subscore is 4. GCS verbal subscore is 5. GCS motor subscore is 6.  Skin: Skin is warm and dry. No rash noted. She is not diaphoretic. No erythema. No pallor.    ED Course  Procedures (including critical care time) Labs Reviewed  GLUCOSE, CAPILLARY  URINALYSIS, ROUTINE W REFLEX MICROSCOPIC  PROTIME-INR  APTT  CBC  DIFFERENTIAL  COMPREHENSIVE METABOLIC PANEL  TROPONIN I   No results found. No diagnosis found.  Date: 04/20/2013  Rate: 85  Rhythm: normal sinus rhythm  QRS Axis: left  Intervals: normal  ST/T Wave abnormalities: ST depressions laterally  Conduction Disutrbances:none  Narrative Interpretation: PRWP  Old EKG Reviewed: unchanged   MDM  Patient was found to have a pna on cxr.  Likely the cause of her weakness.  Family also states she has recurrent UTIs, UA is currently pending.  Patient given 1L IVF bolus, ceftriaxone and azithro for treatment.  She will be admitted to tele for continued care.  Patient also with AKI, Cr up 1.4 from 0.8   Tomasita Crumble, MD 04/20/13 [phone]

## 2013-04-20 NOTE — ED Notes (Signed)
CBG- 92 

## 2013-04-20 NOTE — ED Notes (Signed)
PER EMS: pt from doctors office with complaints of generalized weakness and fatigue x 1 month. BP:70/40 at doctors office. Upon EMS arrival, BP-112/67, HR-72, RR-16, 99% RA. CBG-95. Doctors office and husband states that within the last hr or two they noticed right sided facial droop. A&O x 4. Strong grips, neuro intact, no slurred speech.

## 2013-04-20 NOTE — H&P (Signed)
[facility] History and Physical  [name] ZOX:[id number] DOB: 1936-04-28 DOA: 04/20/2013  Referring physician: PCP: Nadean Corwin, MD  Specialists:  Chief Complaint: 2 weeks of presyncope   HPI: Marie [name] is a 77 y.o. WF PMHx HLD, HTN, frequent falls, chronic bronchitis, 08, neuropathy, S/P Rt Ankle Trimalleolar Fx w/ Ex-Fix. Patient has been gradually becoming weaker, positive nonproductive cough Earlier today patient states complaint Husband of significant weakness the husband took patient to see  Dr. Nicola Police, and she was found to be profoundly orthostatic, 70/40 in office. Also possible facial droop there that pt's family didn't note. On exam today, no sig appreciable facial droop, no other focal deficits, just generalized fatigue and weakness. CXR shows possible early LLL pneumonia. Pt will be treated with IVF's, abx for CAP and admission. Lungs clear, not hypoxic, No arm drift. No facial droop. TODAY patient's daughter present and stated that patient had been trying to lose weight and had decreased her nutrition intake significantly as well as increased her caffeine intake. Daughter stated that patient was not drinking fluids as she should. Patient agreed that she been drinking significant coffee, tea.   Review of Systems: The patient denies anorexia, fever, weight loss,, vision loss, decreased hearing, hoarseness, chest pain,  hematochezia, severe indigestion/heartburn, hematuria, incontinence, genital sores, muscle weakness, suspicious skin lesions, transient blindness,  depression, unusual weight change, abnormal bleeding, enlarged lymph nodes, angioedema, and breast masses.    Past Medical History  Diagnosis Date  . Incontinence   . Osteoarthritis     hands,   . Wears dentures   . Osteoporosis   . Bulging disc   . Hyperlipidemia   . Chronic bronchitis     due to congestion at times, on prednisone and advair  . GERD (gastroesophageal reflux disease)   .  Neuropathy     legs stay numb   . Depression     many years ago  . Hypertension     followed by intern med Dr. Kendrick Fries [phone]  . Tubulovillous adenoma of rectum   . Urinary incontinence   . Balance disorder 2008.      Falls a lot.  She can be standing and then leans too far over to one side   . Wears glasses   . Hearing loss     mild  . Iatrogenic adrenal insufficiency    Past Surgical History  Procedure Laterality Date  . Incontinence surgery      multiple procedures, not cured  . Rectal surgery      by dr. Carolynne Edouard, removal of polyp  . Appendectomy    . Abdominal hysterectomy    . Tonsillectomy    . Eye surgery      bilateral cataract surgery and lens implant  . Dilation and curettage of uterus    . Rectal biopsy  09/21/2011    Procedure: BIOPSY RECTAL;  Surgeon: Robyne Askew, MD;  Location: [facility] OR;  Service: General;  Laterality: N/A;  3-4 cm  . Finger surgery      fusions and debridements for OA  . External fixation leg  10/25/2012    Procedure: EXTERNAL FIXATION LEG;  Surgeon: Budd Palmer, MD;  Location: [facility] OR;  Service: Orthopedics;  Laterality: Right;   Social History:  reports that she has never smoked. She has never used smokeless tobacco. She reports that she does not drink alcohol or use illicit drugs.   where does patient live--home,    Allergies  Allergen Reactions  .  Codeine Other (See Comments)    hallucinations  . Demerol Other (See Comments)    hallucinations  . Morphine And Related Other (See Comments)    unknown  . Other Other (See Comments)    Invan 7- pt became red all over  . Sulfate Other (See Comments)    unknown    Family History  Problem Relation Age of Onset  . Cancer Sister     pt unaware of what kind  . High blood pressure Mother   . Heart attack Father      Prior to Admission medications   Medication Sig Start Date End Date Taking? Authorizing Provider  acetaminophen (TYLENOL) 325 MG tablet Take 1-2 tablets (325-650 mg  total) by mouth every 6 (six) hours as needed. 10/27/12  Yes [name], PA-C  amitriptyline (ELAVIL) 10 MG tablet Take 10 mg by mouth 2 (two) times daily.  08/15/11  Yes Historical Provider, MD  aspirin 81 MG tablet Take 81 mg by mouth daily.    Yes Historical Provider, MD  bisacodyl (DULCOLAX) 10 MG suppository Place 1 suppository (10 mg total) rectally daily as needed. 10/28/12  Yes Gwenyth Bender, NP  cholecalciferol (VITAMIN D) 1000 UNITS tablet Take 5 tablets (5,000 Units total) by mouth daily. 10/28/12  Yes Lesle Chris Black, NP  Cholecalciferol (VITAMIN D-3) 5000 UNITS TABS Take 1 tablet by mouth daily.    Yes Historical Provider, MD  Fluticasone-Salmeterol (ADVAIR DISKUS) 250-50 MCG/DOSE AEPB Inhale 1 puff into the lungs 2 (two) times daily.    Yes Historical Provider, MD  HYDROcodone-acetaminophen (NORCO/VICODIN) 5-325 MG per tablet Take 1-2 tablets by mouth every 6 (six) hours as needed for pain. 10/27/12  Yes [name], PA-C  losartan-hydrochlorothiazide (HYZAAR) 100-25 MG per tablet Take 1 tablet by mouth daily.    Yes Historical Provider, MD  metoprolol tartrate (LOPRESSOR) 12.5 mg TABS Take 0.5 tablets (12.5 mg total) by mouth 2 (two) times daily. 10/28/12  Yes Gwenyth Bender, NP  Multiple Vitamin (MULTIVITAMIN) capsule Take 1 capsule by mouth daily.    Yes Historical Provider, MD  omeprazole (PRILOSEC) 20 MG capsule Take 20 mg by mouth 2 (two) times daily.  07/11/11  Yes Historical Provider, MD  oxybutynin (DITROPAN) 5 MG tablet Take 5 mg by mouth daily.  07/13/11  Yes Historical Provider, MD  pravastatin (PRAVACHOL) 40 MG tablet Take 40 mg by mouth daily.  07/11/11  Yes Historical Provider, MD  predniSONE (DELTASONE) 5 MG tablet Take 10 mg by mouth daily.   Yes Historical Provider, MD  traMADol (ULTRAM) 50 MG tablet Take 50 mg by mouth 2 (two) times daily as needed. For coughing   Yes Historical Provider, MD   Physical Exam: Filed Vitals:   04/20/13 1530 04/20/13 1700 04/20/13 2131 04/21/13  0529  BP:  124/45 119/60 134/70  Pulse:  83 82 72  Temp: 97 F (36.1 C) 98.1 F (36.7 C) 98.1 F (36.7 C) 97.8 F (36.6 C)  TempSrc:  Oral Oral Oral  Resp:  18 18 20   Height:  5\' 4"  (1.626 m)    Weight:  84.8 kg (186 lb 15.2 oz)    SpO2:  100% 98% 94%     General: Alert, operative,NAD, still expresses feeling weakness  Eyes: Equal round reactive to light and accommodation  Cardiovascular: Regular rhythm and rate, negative murmurs rubs or gallops, PT/DP pulses 2+  Respiratory: Clear to auscultation bilateral  Abdomen: Soft, nontender, nondistended, plus bowel sounds  Skin: Bruising  on her right forearm  Neurologic: Pupils equal and react to light and accommodation, cranial nerves II through XII are intact, strength in all extremities 5/5  Labs on Admission:  Basic Metabolic Panel:  Recent Labs Lab 04/20/13 1330 04/20/13 1342 04/20/13 2026  NA 137 138  --   K 3.5 3.6  --   CL 97 101  --   CO2 26  --   --   GLUCOSE 93 96  --   BUN 22 23  --   CREATININE 1.19* 1.40* 1.13*  CALCIUM 9.4  --   --    Liver Function Tests:  Recent Labs Lab 04/20/13 1330  AST 17  ALT 20  ALKPHOS 53  BILITOT 0.8  PROT 6.5  ALBUMIN 3.5   No results found for this basename: LIPASE, AMYLASE,  in the last 168 hours No results found for this basename: AMMONIA,  in the last 168 hours CBC:  Recent Labs Lab 04/20/13 1330 04/20/13 1342 04/20/13 2026  WBC 10.4  --  11.6*  NEUTROABS 7.0  --   --   HGB 17.7* 18.0* 16.1*  HCT 50.1* 53.0* 47.3*  MCV 91.4  --  91.8  PLT 281  --  275   Cardiac Enzymes:  Recent Labs Lab 04/20/13 1330  TROPONINI <0.30    BNP (last 3 results) No results found for this basename: PROBNP,  in the last 8760 hours CBG:  Recent Labs Lab 04/20/13 1321  GLUCAP 92    Radiological Exams on Admission: Dg Chest 2 View  04/20/2013   *RADIOLOGY REPORT*  Clinical Data: Weakness  CHEST - 2 VIEW  Comparison: 02/12/2012  Findings: Cardiomediastinal  silhouette is stable.  No pulmonary edema.  There is left basilar atelectasis or early infiltrate. Mild thoracic dextroscoliosis.  IMPRESSION: Left basilar atelectasis or early infiltrate.  No pulmonary edema. Mild dextroscoliosis thoracic spine.   Original Report Authenticated By: Natasha Mead, M.D.    EKG: Mild left axis deviation, otherwise unremarkable EKG   Assessment/Plan Principal Problem:   CAP (community acquired pneumonia) Active Problems:   Hyperlipidemia   Hypertension   Dehydration   1. Hypotension; patient most likely hypotensive secondary to patient's dieting, and large intake of caffeine. We'll hydrate, and have counseled patient on the need for adequate intake of fluids during the day which do not contain(milk, juice, water) 2. CAP; patient has CXR findings consistent with early stages of pneumonia we'll treat per protocol. Will give patient a 2 day steroid burst 3. HTN; not applicable Will hold patient's hypertension medication at this time 4. Chronic bronchitis; patient on Advair and chronic steroids for her chronic bronchitis. We'll continue that regimen while in the hospital    Family Communication: Communicated care plan to daughter Erskine Squibb   Disposition Plan:   Time spent: 60 minute Drema [place] [facility] Pager [phone]  If 7PM-7AM, please contact night-coverage [email or web address] Password [facility] 04/21/2013, 6:42 AM

## 2013-04-21 ENCOUNTER — Encounter (HOSPITAL_COMMUNITY): Payer: Self-pay | Admitting: *Deleted

## 2013-04-21 ENCOUNTER — Inpatient Hospital Stay (HOSPITAL_COMMUNITY): Payer: Medicare Other

## 2013-04-21 DIAGNOSIS — E785 Hyperlipidemia, unspecified: Secondary | ICD-10-CM

## 2013-04-21 DIAGNOSIS — R0602 Shortness of breath: Secondary | ICD-10-CM | POA: Diagnosis not present

## 2013-04-21 DIAGNOSIS — I10 Essential (primary) hypertension: Secondary | ICD-10-CM

## 2013-04-21 DIAGNOSIS — E860 Dehydration: Secondary | ICD-10-CM

## 2013-04-21 DIAGNOSIS — J189 Pneumonia, unspecified organism: Secondary | ICD-10-CM

## 2013-04-21 LAB — CBC WITH DIFFERENTIAL/PLATELET
Basophils Relative: 1 % (ref 0–1)
Eosinophils Absolute: 0.2 10*3/uL (ref 0.0–0.7)
Hemoglobin: 16.1 g/dL — ABNORMAL HIGH (ref 12.0–15.0)
Lymphocytes Relative: 27 % (ref 12–46)
MCH: 31.4 pg (ref 26.0–34.0)
MCHC: 34.5 g/dL (ref 30.0–36.0)
Monocytes Absolute: 0.7 10*3/uL (ref 0.1–1.0)
Neutrophils Relative %: 60 % (ref 43–77)
Platelets: 250 10*3/uL (ref 150–400)
RBC: 5.12 MIL/uL — ABNORMAL HIGH (ref 3.87–5.11)

## 2013-04-21 LAB — COMPREHENSIVE METABOLIC PANEL
ALT: 16 U/L (ref 0–35)
CO2: 22 mEq/L (ref 19–32)
Calcium: 8.6 mg/dL (ref 8.4–10.5)
Chloride: 103 mEq/L (ref 96–112)
Creatinine, Ser: 1.08 mg/dL (ref 0.50–1.10)
GFR calc Af Amer: 57 mL/min — ABNORMAL LOW (ref >90)
GFR calc non Af Amer: 49 mL/min — ABNORMAL LOW (ref >90)
Glucose, Bld: 85 mg/dL (ref 70–99)
Total Bilirubin: 0.4 mg/dL (ref 0.3–1.2)

## 2013-04-21 LAB — LEGIONELLA ANTIGEN, URINE: Legionella Antigen, Urine: NEGATIVE

## 2013-04-21 LAB — HIV ANTIBODY (ROUTINE TESTING W REFLEX): HIV: NONREACTIVE

## 2013-04-21 MED ORDER — TRAMADOL HCL 50 MG PO TABS
50.0000 mg | ORAL_TABLET | Freq: Two times a day (BID) | ORAL | Status: DC | PRN
  Filled 2013-04-21: qty 1

## 2013-04-21 MED ORDER — MOMETASONE FURO-FORMOTEROL FUM 100-5 MCG/ACT IN AERO
2.0000 | INHALATION_SPRAY | Freq: Two times a day (BID) | RESPIRATORY_TRACT | Status: DC
  Administered 2013-04-21 (×2): 2 via RESPIRATORY_TRACT
  Filled 2013-04-21: qty 8.8

## 2013-04-21 MED ORDER — SODIUM CHLORIDE 0.9 % IV SOLN
INTRAVENOUS | Status: DC
  Administered 2013-04-21: 1000 mL via INTRAVENOUS
  Administered 2013-04-21: via INTRAVENOUS

## 2013-04-21 MED ORDER — METOPROLOL TARTRATE 12.5 MG HALF TABLET
12.5000 mg | ORAL_TABLET | Freq: Two times a day (BID) | ORAL | Status: DC
  Administered 2013-04-21 – 2013-04-22 (×3): 12.5 mg via ORAL
  Filled 2013-04-21 (×4): qty 1

## 2013-04-21 MED ORDER — PANTOPRAZOLE SODIUM 40 MG PO TBEC
40.0000 mg | DELAYED_RELEASE_TABLET | Freq: Every day | ORAL | Status: DC
  Administered 2013-04-21 – 2013-04-22 (×2): 40 mg via ORAL
  Filled 2013-04-21 (×2): qty 1

## 2013-04-21 MED ORDER — AMITRIPTYLINE HCL 25 MG PO TABS
25.0000 mg | ORAL_TABLET | Freq: Every day | ORAL | Status: DC
  Administered 2013-04-21: 25 mg via ORAL
  Filled 2013-04-21 (×2): qty 1

## 2013-04-21 MED ORDER — SIMVASTATIN 20 MG PO TABS
20.0000 mg | ORAL_TABLET | Freq: Every day | ORAL | Status: DC
  Administered 2013-04-21: 20 mg via ORAL
  Filled 2013-04-21 (×2): qty 1

## 2013-04-21 MED ORDER — BISACODYL 10 MG RE SUPP: 10.0000 mg | Freq: Every day | RECTAL | Status: DC | PRN

## 2013-04-21 MED ORDER — ASPIRIN 81 MG PO CHEW
81.0000 mg | CHEWABLE_TABLET | Freq: Every day | ORAL | Status: DC
  Administered 2013-04-21 – 2013-04-22 (×2): 81 mg via ORAL
  Filled 2013-04-21 (×3): qty 1

## 2013-04-21 NOTE — Progress Notes (Signed)
[facility] PROGRESS NOTE  Kataleena Holsapple Coste ZOX:[id number] DOB: 1935/12/08 DOA: 04/20/2013 PCP: Nadean Corwin, MD  Assessment/Plan: 1. Community acquired pneumonia -continue IV rocephin/zithromax -check swallow eval -FU cultures data  2. Chronic Bronchitis -stable, prednisone higher dose for 1 more day then back to basal dose  3. Hypotension -due to poor PO intake, in setting of infection and anti-hypertensives, resolved -continue gentle IVF today  DVt proph: lovenox  Ambulate, PT eval  Resume chronic home meds  Code Status: FULL CODE Family Communication: d/w pt and husband at bedside Disposition Plan: Home tomorrow if stable   HPI/Subjective: Feels well, wants to know when she can go home  Objective: Filed Vitals:   04/20/13 1700 04/20/13 2131 04/21/13 0529 04/21/13 1402  BP: 124/45 119/60 134/70 114/68  Pulse: 83 82 72 72  Temp: 98.1 F (36.7 C) 98.1 F (36.7 C) 97.8 F (36.6 C) 98.2 F (36.8 C)  TempSrc: Oral Oral Oral Oral  Resp: 18 18 20 20   Height: 5\' 4"  (1.626 m)     Weight: 84.8 kg (186 lb 15.2 oz)     SpO2: 100% 98% 94% 96%    Intake/Output Summary (Last 24 hours) at 04/21/13 1529 Last data filed at 04/21/13 0600  Gross per 24 hour  Intake   1245 ml  Output    350 ml  Net    895 ml   Filed Weights   04/20/13 1700  Weight: 84.8 kg (186 lb 15.2 oz)    Exam:   General: AAOx3  Cardiovascular: S1S2/RRR  Respiratory: CTAB  Abdomen: soft, NT, BS present  Musculoskeletal: no edema    Data Reviewed: Basic Metabolic Panel:  Recent Labs Lab 04/20/13 1330 04/20/13 1342 04/20/13 2026 04/21/13 0720  NA 137 138  --  136  K 3.5 3.6  --  3.5  CL 97 101  --  103  CO2 26  --   --  22  GLUCOSE 93 96  --  85  BUN 22 23  --  15  CREATININE 1.19* 1.40* 1.13* 1.08  CALCIUM 9.4  --   --  8.6   Liver Function Tests:  Recent Labs Lab 04/20/13 1330 04/21/13 0720  AST 17 15  ALT 20 16  ALKPHOS 53 44  BILITOT 0.8 0.4  PROT  6.5 5.4*  ALBUMIN 3.5 2.8*   No results found for this basename: LIPASE, AMYLASE,  in the last 168 hours No results found for this basename: AMMONIA,  in the last 168 hours CBC:  Recent Labs Lab 04/20/13 1330 04/20/13 1342 04/20/13 2026 04/21/13 0720  WBC 10.4  --  11.6* 7.8  NEUTROABS 7.0  --   --  4.7  HGB 17.7* 18.0* 16.1* 16.1*  HCT 50.1* 53.0* 47.3* 46.7*  MCV 91.4  --  91.8 91.2  PLT 281  --  275 250   Cardiac Enzymes:  Recent Labs Lab 04/20/13 1330  TROPONINI <0.30   BNP (last 3 results) No results found for this basename: PROBNP,  in the last 8760 hours CBG:  Recent Labs Lab 04/20/13 1321  GLUCAP 92    No results found for this or any previous visit (from the past 240 hour(s)).   Studies: Dg Chest 2 View  04/20/2013   *RADIOLOGY REPORT*  Clinical Data: Weakness  CHEST - 2 VIEW  Comparison: 02/12/2012  Findings: Cardiomediastinal silhouette is stable.  No pulmonary edema.  There is left basilar atelectasis or early infiltrate. Mild thoracic dextroscoliosis.  IMPRESSION: Left basilar atelectasis  or early infiltrate.  No pulmonary edema. Mild dextroscoliosis thoracic spine.   Original Report Authenticated By: Natasha Mead, M.D.   Dg Chest Port 1 View  04/21/2013   *RADIOLOGY REPORT*  Clinical Data: Shortness of breath, evaluate pneumonia  PORTABLE CHEST - 1 VIEW  Comparison: 04/20/2013  Findings: Minimal patchy opacity in the lateral left lung base, atelectasis versus pneumonia, unchanged.  No pleural effusion or pneumothorax.  The heart is normal in size.  IMPRESSION: Minimal patchy opacity in the lateral left lung base, atelectasis versus pneumonia, unchanged.   Original Report Authenticated By: Charline Bills, M.D.    Scheduled Meds: . amitriptyline  25 mg Oral QHS  . aspirin  81 mg Oral Daily  . azithromycin  500 mg Intravenous Q24H  . cefTRIAXone (ROCEPHIN)  IV  1 g Intravenous Q24H  . enoxaparin (LOVENOX) injection  40 mg Subcutaneous Q24H  . metoprolol  tartrate  12.5 mg Oral BID  . mometasone-formoterol  2 puff Inhalation BID  . pantoprazole  40 mg Oral Daily  . predniSONE  50 mg Oral Q breakfast  . simvastatin  20 mg Oral q1800   Continuous Infusions: . sodium chloride 1,000 mL (04/21/13 0948)    Principal Problem:   CAP (community acquired pneumonia) Active Problems:   Hyperlipidemia   Hypertension   Dehydration    Time spent:    [facility]  [facility] Pager [phone]. If 7PM-7AM, please contact night-coverage at [email or web address], password [facility] 04/21/2013, 3:29 PM  LOS: 1 day

## 2013-04-21 NOTE — Evaluation (Signed)
Clinical/Bedside Swallow Evaluation Patient Details  Name: Marie [name] MRN: [id number] Date of Birth: 12/08/1935  Today's Date: 04/21/2013 Time: 1030-1055 SLP Time Calculation (min): 25 min  Past Medical History:  Past Medical History  Diagnosis Date  . Incontinence   . Osteoarthritis     hands,   . Wears dentures   . Osteoporosis   . Bulging disc   . Hyperlipidemia   . Chronic bronchitis     due to congestion at times, on prednisone and advair  . GERD (gastroesophageal reflux disease)   . Neuropathy     legs stay numb   . Depression     many years ago  . Hypertension     followed by intern med Dr. Kendrick Fries [phone]  . Tubulovillous adenoma of rectum   . Urinary incontinence   . Balance disorder 2008.      Falls a lot.  She can be standing and then leans too far over to one side   . Wears glasses   . Hearing loss     mild  . Iatrogenic adrenal insufficiency    Past Surgical History:  Past Surgical History  Procedure Laterality Date  . Incontinence surgery      multiple procedures, not cured  . Rectal surgery      by dr. Carolynne Edouard, removal of polyp  . Appendectomy    . Abdominal hysterectomy    . Tonsillectomy    . Eye surgery      bilateral cataract surgery and lens implant  . Dilation and curettage of uterus    . Rectal biopsy  09/21/2011    Procedure: BIOPSY RECTAL;  Surgeon: Robyne Askew, MD;  Location: [facility] OR;  Service: General;  Laterality: N/A;  3-4 cm  . Finger surgery      fusions and debridements for OA  . External fixation leg  10/25/2012    Procedure: EXTERNAL FIXATION LEG;  Surgeon: Budd Palmer, MD;  Location: [facility] OR;  Service: Orthopedics;  Laterality: Right;   HPI:  Marie [name] is a 77 y.o. WF PMHx HLD, HTN, frequent falls, chronic bronchitis, 08, neuropathy, S/P Rt Ankle Trimalleolar Fx w/ Ex-Fix. Patient has been gradually becoming weaker, positive nonproductive cough Earlier today patient states complaint Husband of significant weakness the  husband took patient to see  Dr. Nicola Police, and she was found to be profoundly orthostatic, 70/40 in office. Also possible facial droop there that pt's family didn't note. On exam today, no sig appreciable facial droop, no other focal deficits, just generalized fatigue and weakness. CXR shows possible early LLL pneumonia. Pt will be treated with IVF's, abx for CAP and admission. Lungs clear, not hypoxic, No arm drift. No facial droop. TODAY patient's daughter present and stated that patient had been trying to lose weight and had decreased her nutrition intake significantly as well as increased her caffeine intake. Daughter stated that patient was not drinking fluids as she should. Patient agreed that she been drinking significant coffee, tea.   Assessment / Plan / Recommendation Clinical Impression  Pt with minimal evidence of aspiration. With one (initial) sip out of 10 pt with hard cough. Swallow response appears timely and strong. Pt with few risk factors and no history of pna prior. Pt does have a history of GERD and chronic bronchitis and reports she has a cough occasionally due to this. Husband confirms this. Discussed reflux precautions with pt/family, most of which pt is already following. Recommend pt continue diet with minimal  aspiration risk. No f/u at this time; If aspiration strongly suspected, MD could order MBS for objective test of swallow function    Aspiration Risk  Mild    Diet Recommendation Regular;Thin liquid   Liquid Administration via: Cup;Straw Medication Administration: Whole meds with liquid Supervision: Patient able to self feed Postural Changes and/or Swallow Maneuvers: Seated upright 90 degrees;Out of bed for meals;Upright 30-60 min after meal    Other  Recommendations Oral Care Recommendations: Patient independent with oral care   Follow Up Recommendations  None    Frequency and Duration        Pertinent Vitals/Pain NA    SLP Swallow Goals     Swallow  Study Prior Functional Status       General HPI: Marie [name] is a 77 y.o. WF PMHx HLD, HTN, frequent falls, chronic bronchitis, 08, neuropathy, S/P Rt Ankle Trimalleolar Fx w/ Ex-Fix. Patient has been gradually becoming weaker, positive nonproductive cough Earlier today patient states complaint Husband of significant weakness the husband took patient to see  Dr. Nicola Police, and she was found to be profoundly orthostatic, 70/40 in office. Also possible facial droop there that pt's family didn't note. On exam today, no sig appreciable facial droop, no other focal deficits, just generalized fatigue and weakness. CXR shows possible early LLL pneumonia. Pt will be treated with IVF's, abx for CAP and admission. Lungs clear, not hypoxic, No arm drift. No facial droop. TODAY patient's daughter present and stated that patient had been trying to lose weight and had decreased her nutrition intake significantly as well as increased her caffeine intake. Daughter stated that patient was not drinking fluids as she should. Patient agreed that she been drinking significant coffee, tea. Type of Study: Bedside swallow evaluation Diet Prior to this Study: Regular;Thin liquids Temperature Spikes Noted: No Respiratory Status: Supplemental O2 delivered via (comment) History of Recent Intubation: No Behavior/Cognition: Alert;Cooperative;Pleasant mood Oral Cavity - Dentition: Dentures, top;Dentures, not available;Missing dentition (bottom partial at home) Self-Feeding Abilities: Able to feed self Patient Positioning: Upright in chair Baseline Vocal Quality: Clear Volitional Cough: Strong Volitional Swallow: Able to elicit    Oral/Motor/Sensory Function Overall Oral Motor/Sensory Function: Appears within functional limits for tasks assessed   Ice Chips     Thin Liquid Thin Liquid: Within functional limits Presentation: Cup;Straw (hard cough following initial sip. pt reports reflux cough)    Nectar Thick Nectar Thick  Liquid: Not tested   Honey Thick Honey Thick Liquid: Not tested   Puree Puree: Within functional limits   Solid   GO    Solid: Within functional limits      [facility], [place] CCC-SLP [phone]  Claudine Mouton 04/21/2013,11:11 AM

## 2013-04-21 NOTE — Evaluation (Signed)
Physical Therapy Evaluation Patient Details Name: Marie [name] MRN: [id number] DOB: [birth year] Today's Date: 04/21/2013 Time: 7829-5621 PT Time Calculation (min): 16 min  PT Assessment / Plan / Recommendation History of Present Illness     Clinical Impression  Pt is a 77 y.o. WF PMHx HLD, HTN, frequent falls, chronic bronchitis, neuropathy, S/P Rt Ankle Trimalleolar Fx w/ Ex-Fix and admitted due to increased weakness at home and presyncope, diagnosed with hypotension and CAP. Pt currently with functional limitations due to the deficits listed below (see PT Problem List).  Pt will benefit from skilled PT to increase their independence and safety with mobility to allow discharge to home with spouse.  Recommended HHPT for balance deficits however pt declines at this time.     PT Assessment  Patient needs continued PT services    Follow Up Recommendations  Home health PT (however pt declines at this time)    Does the patient have the potential to tolerate intense rehabilitation      Barriers to Discharge        Equipment Recommendations  None recommended by PT    Recommendations for Other Services     Frequency Min 3X/week    Precautions / Restrictions Precautions Precautions: Fall   Pertinent Vitals/Pain SaO2 room air at rest 95% SaO2 room air after ambulation 100% RN notified of good sats and that pt left off O2      Mobility  Bed Mobility Bed Mobility: Not assessed Transfers Transfers: Sit to Stand;Stand to Sit Sit to Stand: 4: Min guard;With upper extremity assist;From chair/3-in-1;From toilet Stand to Sit: 4: Min guard;With upper extremity assist;To chair/3-in-1;To toilet Details for Transfer Assistance: verbal cues for safe technique Ambulation/Gait Ambulation/Gait Assistance: 4: Min guard Ambulation Distance (Feet): 300 Feet Assistive device: Rolling walker Ambulation/Gait Assistance Details: unsteady gait observed however no LOB, more unsteady during head  turns looking at environment and with turning, verbal cues for keeping RW closer Gait Pattern: Step-through pattern    Exercises     PT Diagnosis: Difficulty walking  PT Problem List: Decreased balance;Decreased mobility PT Treatment Interventions: DME instruction;Gait training;Functional mobility training;Therapeutic activities;Therapeutic exercise;Neuromuscular re-education;Balance training;Patient/family education     PT Goals(Current goals can be found in the care plan section) Acute Rehab PT Goals PT Goal Formulation: With patient Time For Goal Achievement: 04/28/13 Potential to Achieve Goals: Good  Visit Information  Last PT Received On: 04/21/13 Assistance Needed: +1       Prior Functioning  Home Living Family/patient expects to be discharged to:: Private residence Living Arrangements: Spouse/significant other Type of Home: House Home Access: Ramped entrance Home Layout: One level Home Equipment: Environmental consultant - 2 wheels;Walker - 4 wheels Prior Function Level of Independence: Independent with assistive device(s) Communication Communication: No difficulties    Cognition  Cognition Arousal/Alertness: Awake/alert Behavior During Therapy: WFL for tasks assessed/performed Overall Cognitive Status: Within Functional Limits for tasks assessed    Extremity/Trunk Assessment Lower Extremity Assessment Lower Extremity Assessment: Generalized weakness   Balance Balance Balance Assessed: Yes High Level Balance High Level Balance Activites: Head turns;Direction changes High Level Balance Comments: min/guard for above activities due to unsteadiness, pt reports hx of falls and poor balance  End of Session PT - End of Session Activity Tolerance: Patient tolerated treatment well Patient left: in chair;with call bell/phone within reach;with family/visitor present Nurse Communication: Mobility status  GP     [name],KATHrine E 04/21/2013, 12:16 PM Zenovia Jarred, PT,  DPT 04/21/2013 Pager: [phone]

## 2013-04-21 NOTE — Evaluation (Signed)
Occupational Therapy Evaluation Patient Details Name: Marie [name] MRN: [id number] DOB: 1936-04-13 Today's Date: 04/21/2013 Time: 8657-8469 OT Time Calculation (min): 21 min  OT Assessment / Plan / Recommendation History of present illness  PMH includes HLD, HTN, frequent falls, chronic bronchitis, neuropathy, s/p right ankles trimalleolar fx with Ex-Fix (January, 2014).   Clinical Impression   Pt admitted with increased weakness and presyncope. Will continue to follow acutely in order to address below problem list in prep for return home.    OT Assessment  Patient needs continued OT Services    Follow Up Recommendations  No OT follow up;Supervision/Assistance - 24 hour    Barriers to Discharge      Equipment Recommendations  None recommended by OT    Recommendations for Other Services    Frequency  Min 2X/week    Precautions / Restrictions Precautions Precautions: Fall   Pertinent Vitals/Pain See vitals    ADL  Grooming: Performed;Wash/dry hands;Set up;Brushing hair Where Assessed - Grooming: Unsupported standing Upper Body Bathing: Simulated;Set up Where Assessed - Upper Body Bathing: Unsupported sitting Lower Body Bathing: Simulated;Min guard Where Assessed - Lower Body Bathing: Unsupported sit to stand Upper Body Dressing: Simulated;Set up Where Assessed - Upper Body Dressing: Unsupported sit to stand Lower Body Dressing: Simulated;Min guard Where Assessed - Lower Body Dressing: Unsupported sit to stand Toilet Transfer: Performed;Min guard Toilet Transfer Method: Sit to Barista:  (chair) Equipment Used: Rolling walker;Gait belt Transfers/Ambulation Related to ADLs: min guard with RW ADL Comments: Pt moving slowy due to fatigue. Min guard for safety.    OT Diagnosis: Generalized weakness  OT Problem List: Decreased strength;Decreased activity tolerance;Impaired balance (sitting and/or standing);Decreased knowledge of use of DME or AE OT  Treatment Interventions: Self-care/ADL training;DME and/or AE instruction;Therapeutic activities;Patient/family education;Balance training   OT Goals(Current goals can be found in the care plan section) Acute Rehab OT Goals OT Goal Formulation: With patient Time For Goal Achievement: 04/28/13 Potential to Achieve Goals: Good  Visit Information  Last OT Received On: 04/21/13 Assistance Needed: +1       Prior Functioning     Home Living Family/patient expects to be discharged to:: Private residence Living Arrangements: Spouse/significant other Available Help at Discharge: Family Type of Home: House Home Access: Ramped entrance Home Layout: One level Home Equipment: Environmental consultant - 2 wheels;Walker - 4 wheels Prior Function Level of Independence: Independent with assistive device(s) Communication Communication: No difficulties         Vision/Perception     Cognition  Cognition Arousal/Alertness: Awake/alert Behavior During Therapy: WFL for tasks assessed/performed Overall Cognitive Status: Within Functional Limits for tasks assessed    Extremity/Trunk Assessment Upper Extremity Assessment Upper Extremity Assessment: Overall WFL for tasks assessed Lower Extremity Assessment Lower Extremity Assessment: Generalized weakness     Mobility Bed Mobility Bed Mobility: Not assessed Transfers Transfers: Sit to Stand;Stand to Sit Sit to Stand: 4: Min guard;From chair/3-in-1;With upper extremity assist;With armrests Stand to Sit: 4: Min guard;To chair/3-in-1;With armrests;With upper extremity assist Details for Transfer Assistance: VCs for safe hand placement     Exercise     Balance    End of Session OT - End of Session Equipment Utilized During Treatment: Rolling walker Activity Tolerance: Patient limited by fatigue Patient left: in chair;with call bell/phone within reach Nurse Communication: Mobility status  GO    04/21/2013 Cipriano Mile OTR/L Pager  [phone] Office [phone]  Cipriano Mile 04/21/2013, 2:55 PM

## 2013-04-22 DIAGNOSIS — Z9181 History of falling: Secondary | ICD-10-CM

## 2013-04-22 DIAGNOSIS — R29818 Other symptoms and signs involving the nervous system: Secondary | ICD-10-CM

## 2013-04-22 DIAGNOSIS — E860 Dehydration: Secondary | ICD-10-CM

## 2013-04-22 DIAGNOSIS — J42 Unspecified chronic bronchitis: Secondary | ICD-10-CM

## 2013-04-22 DIAGNOSIS — J189 Pneumonia, unspecified organism: Principal | ICD-10-CM

## 2013-04-22 LAB — COMPREHENSIVE METABOLIC PANEL
BUN: 13 mg/dL (ref 6–23)
CO2: 27 mEq/L (ref 19–32)
Calcium: 8.2 mg/dL — ABNORMAL LOW (ref 8.4–10.5)
Creatinine, Ser: 0.99 mg/dL (ref 0.50–1.10)
GFR calc Af Amer: 63 mL/min — ABNORMAL LOW (ref >90)
GFR calc non Af Amer: 55 mL/min — ABNORMAL LOW (ref >90)
Glucose, Bld: 123 mg/dL — ABNORMAL HIGH (ref 70–99)

## 2013-04-22 LAB — CBC WITH DIFFERENTIAL/PLATELET
Basophils Relative: 0 % (ref 0–1)
Hemoglobin: 14.3 g/dL (ref 12.0–15.0)
Lymphs Abs: 2 10*3/uL (ref 0.7–4.0)
Monocytes Relative: 9 % (ref 3–12)
Neutro Abs: 7.2 10*3/uL (ref 1.7–7.7)
Neutrophils Relative %: 71 % (ref 43–77)
Platelets: 251 10*3/uL (ref 150–400)
RBC: 4.51 MIL/uL (ref 3.87–5.11)

## 2013-04-22 MED ORDER — AZITHROMYCIN 500 MG PO TABS
500.0000 mg | ORAL_TABLET | Freq: Every day | ORAL | Status: DC
  Filled 2013-04-22: qty 1

## 2013-04-22 MED ORDER — POTASSIUM CHLORIDE ER 20 MEQ PO TBCR: 20.0000 meq | EXTENDED_RELEASE_TABLET | Freq: Every day | ORAL | Status: DC

## 2013-04-22 MED ORDER — LEVOFLOXACIN 500 MG PO TABS: 500.0000 mg | ORAL_TABLET | Freq: Every day | ORAL | Status: DC

## 2013-04-22 MED ORDER — POTASSIUM CHLORIDE CRYS ER 20 MEQ PO TBCR
60.0000 meq | EXTENDED_RELEASE_TABLET | Freq: Once | ORAL | Status: AC
  Administered 2013-04-22: 60 meq via ORAL
  Filled 2013-04-22: qty 3

## 2013-04-22 MED ORDER — LEVOFLOXACIN 500 MG PO TABS
500.0000 mg | ORAL_TABLET | Freq: Every day | ORAL | Status: DC
  Administered 2013-04-22: 500 mg via ORAL
  Filled 2013-04-22: qty 1

## 2013-04-22 NOTE — Discharge Summary (Addendum)
Physician Discharge Summary  Marie [name] YQM:[id number] DOB: June 17, 1936 DOA: 04/20/2013  PCP: Nadean Corwin, MD  Admit date: 04/20/2013 Discharge date: 04/22/2013  Time spent: 45 minutes  Recommendations for Outpatient Follow-up:  1. PCP in 1 week 2. Bmet in 1 week 3. Home health physical therapy   Discharge Diagnoses:  Principal Problem:   CAP (community acquired pneumonia)   Dehydration   Hypokalemia   Hyperlipidemia   Hypertension   CHronic bronchitis   Neuropathy   Gait disorder   Frequent falls  Discharge Condition: STable  Diet recommendation: heart healthy  Filed Weights   04/20/13 1700  Weight: 84.8 kg (186 lb 15.2 oz)    History of present illness:  Marie [name] is a 77 y.o. WF PMHx HLD, HTN, frequent falls, chronic bronchitis, 08, neuropathy, S/P Rt Ankle Trimalleolar Fx w/ Ex-Fix. Patient has been gradually becoming weaker, positive nonproductive cough Earlier today patient states complaint Husband of significant weakness the husband took patient to see Dr. Nicola Police, and she was found to be profoundly orthostatic, 70/40 in office. Also possible facial droop there that pt's family didn't note. On exam today, no sig appreciable facial droop, no other focal deficits, just generalized fatigue and weakness. CXR shows possible early LLL pneumonia. Pt will be treated with IVF's, abx for CAP and admission. Lungs clear, not hypoxic, No arm drift. No facial droop. On admission patient's daughter present and stated that patient had been trying to lose weight and had decreased her nutrition intake significantly as well as increased her caffeine intake. Daughter stated that patient was not drinking fluids as she should. Patient agreed that she been drinking significant coffee, tea.   Hospital Course:  1. Community acquired pneumonia -treated with IV rocephin/zithromax then transitioned to PO levaquin for 5 more days to complete a 7 day course. -Had swallow eval which  revealed only minimal evidence/risk of aspiration. Aspiration precautions were advised.  2. Chronic Bronchitis  -stable, she was treated with a higher dose of prednisone for 2 days due to risk of adrenal insufficiency, now back to basal Prednisone   3. Hypotension  -due to poor PO intake, dehydration in setting of infection and anti-hypertensives, resolved  -treated with IVF -home antihypertensives slowly resumed  4. Hypokalemia -corrected, due to HCTZ -added daily KCL at discharge -needs repeat Bmet in 1 week  5. Gait disorder/frequent falls -chronic problem -seen by PT, home health PT was recommended which was set up prior to discharge.   Consultations:  Speech therapy  Discharge Exam: Filed Vitals:   04/21/13 2044 04/21/13 2113 04/22/13 0430 04/22/13 0856  BP:  130/70 144/69 128/59  Pulse: 70 75 68 74  Temp:  98.7 F (37.1 C) 98.1 F (36.7 C)   TempSrc:  Oral Oral   Resp: 20 18 20    Height:      Weight:      SpO2: 96% 93% 96%     General: AAOx3 Cardiovascular: S1S2/RRR Respiratory: faint ronchi at R base  Discharge Instructions  Discharge Orders   Future Appointments Provider Department Dept Phone   05/01/2013 11:45 AM [facility] [place] [facility] [phone]   Patient should wear two piece clothing and wear no powder or deodorant. Patient should arrive 15 minutes early.   Future Orders Complete By Expires     Diet - low sodium heart healthy  As directed     Increase activity slowly  As directed         Medication List  acetaminophen 325 MG tablet  Commonly known as:  TYLENOL  Take 1-2 tablets (325-650 mg total) by mouth every 6 (six) hours as needed.     ADVAIR DISKUS 250-50 MCG/DOSE Aepb  Generic drug:  Fluticasone-Salmeterol  Inhale 1 puff into the lungs 2 (two) times daily.     amitriptyline 10 MG tablet  Commonly known as:  ELAVIL  Take 10 mg by mouth 2 (two) times daily.     aspirin 81 MG tablet  Take 81  mg by mouth daily.     bisacodyl 10 MG suppository  Commonly known as:  DULCOLAX  Place 1 suppository (10 mg total) rectally daily as needed.     HYDROcodone-acetaminophen 5-325 MG per tablet  Commonly known as:  NORCO/VICODIN  Take 1-2 tablets by mouth every 6 (six) hours as needed for pain.     levofloxacin 500 MG tablet  Commonly known as:  LEVAQUIN  Take 1 tablet (500 mg total) by mouth daily. For 5 days     losartan-hydrochlorothiazide 100-25 MG per tablet  Commonly known as:  HYZAAR  Take 1 tablet by mouth daily.     metoprolol tartrate 12.5 mg Tabs  Commonly known as:  LOPRESSOR  Take 0.5 tablets (12.5 mg total) by mouth 2 (two) times daily.     multivitamin capsule  Take 1 capsule by mouth daily.     omeprazole 20 MG capsule  Commonly known as:  PRILOSEC  Take 20 mg by mouth 2 (two) times daily.     oxybutynin 5 MG tablet  Commonly known as:  DITROPAN  Take 5 mg by mouth daily.     Potassium Chloride ER 20 MEQ Tbcr  Take 20 mEq by mouth daily.     pravastatin 40 MG tablet  Commonly known as:  PRAVACHOL  Take 40 mg by mouth daily.     predniSONE 5 MG tablet  Commonly known as:  DELTASONE  Take 10 mg by mouth daily.     traMADol 50 MG tablet  Commonly known as:  ULTRAM  Take 50 mg by mouth 2 (two) times daily as needed. For coughing     Vitamin D-3 5000 UNITS Tabs  Take 1 tablet by mouth daily.     cholecalciferol 1000 UNITS tablet  Commonly known as:  VITAMIN D  Take 5 tablets (5,000 Units total) by mouth daily.       Allergies  Allergen Reactions  . Codeine Other (See Comments)    hallucinations  . Demerol Other (See Comments)    hallucinations  . Morphine And Related Other (See Comments)    unknown  . Other Other (See Comments)    Invan 7- pt became red all over  . Sulfate Other (See Comments)    unknown       Follow-up Information   Follow up with Nadean Corwin, MD In 1 week.   Contact information:   [address] [place] [address] [phone]       Follow up with Lab-B[facility]  In 1 week.       The results of significant diagnostics from this hospitalization (including imaging, microbiology, ancillary and laboratory) are listed below for reference.    Significant Diagnostic Studies: Dg Chest 2 View  04/20/2013   *RADIOLOGY REPORT*  Clinical Data: Weakness  CHEST - 2 VIEW  Comparison: 02/12/2012  Findings: Cardiomediastinal silhouette is stable.  No pulmonary edema.  There is left basilar atelectasis or early infiltrate. Mild thoracic dextroscoliosis.  IMPRESSION: Left  basilar atelectasis or early infiltrate.  No pulmonary edema. Mild dextroscoliosis thoracic spine.   Original Report Authenticated By: Natasha Mead, M.D.   Dg Chest Port 1 View  04/21/2013   *RADIOLOGY REPORT*  Clinical Data: Shortness of breath, evaluate pneumonia  PORTABLE CHEST - 1 VIEW  Comparison: 04/20/2013  Findings: Minimal patchy opacity in the lateral left lung base, atelectasis versus pneumonia, unchanged.  No pleural effusion or pneumothorax.  The heart is normal in size.  IMPRESSION: Minimal patchy opacity in the lateral left lung base, atelectasis versus pneumonia, unchanged.   Original Report Authenticated By: Charline Bills, M.D.    Microbiology: No results found for this or any previous visit (from the past 240 hour(s)).   Labs: Basic Metabolic Panel:  Recent Labs Lab 04/20/13 1330 04/20/13 1342 04/20/13 2026 04/21/13 0720 04/22/13 0414  NA 137 138  --  136 140  K 3.5 3.6  --  3.5 2.9*  CL 97 101  --  103 106  CO2 26  --   --  22 27  GLUCOSE 93 96  --  85 123*  BUN 22 23  --  15 13  CREATININE 1.19* 1.40* 1.13* 1.08 0.99  CALCIUM 9.4  --   --  8.6 8.2*   Liver Function Tests:  Recent Labs Lab 04/20/13 1330 04/21/13 0720 04/22/13 0414  AST 17 15 13   ALT 20 16 15   ALKPHOS 53 44 42  BILITOT 0.8 0.4 0.2*  PROT 6.5 5.4* 5.1*  ALBUMIN 3.5 2.8* 2.6*   No results found for this basename:  LIPASE, AMYLASE,  in the last 168 hours No results found for this basename: AMMONIA,  in the last 168 hours CBC:  Recent Labs Lab 04/20/13 1330 04/20/13 1342 04/20/13 2026 04/21/13 0720 04/22/13 0414  WBC 10.4  --  11.6* 7.8 10.2  NEUTROABS 7.0  --   --  4.7 7.2  HGB 17.7* 18.0* 16.1* 16.1* 14.3  HCT 50.1* 53.0* 47.3* 46.7* 41.1  MCV 91.4  --  91.8 91.2 91.1  PLT 281  --  275 250 251   Cardiac Enzymes:  Recent Labs Lab 04/20/13 1330  TROPONINI <0.30   BNP: BNP (last 3 results) No results found for this basename: PROBNP,  in the last 8760 hours CBG:  Recent Labs Lab 04/20/13 1321  GLUCAP 92       Signed:  Derwin Reddy  [facility] 04/22/2013, 10:36 AM

## 2013-04-22 NOTE — Progress Notes (Signed)
Pt. discharged to floor,verbalized understanding of discharged instruction,medication,restriction,diet and follow up appointment.Baseline Vitals sign stable,Pt comfortable,no sign and symptom of distress. 

## 2013-04-22 NOTE — Care Management Note (Signed)
Cm spoke with patient at bedside concerning PT/OT recommendation for [facility] services. Per pt refusing HH services. Pt resides home with spouse who will assist in home care and provide tx. No other barriers or needs identified.    Marie [name] [phone]

## 2013-04-23 LAB — URINE CULTURE: Colony Count: 4000

## 2013-04-25 DIAGNOSIS — R5381 Other malaise: Secondary | ICD-10-CM | POA: Diagnosis not present

## 2013-04-25 DIAGNOSIS — R5383 Other fatigue: Secondary | ICD-10-CM | POA: Diagnosis not present

## 2013-04-25 NOTE — Progress Notes (Signed)
Utilization review complete. Devota Viruet RN CCM Case Mgmt phone [phone] 

## 2013-04-26 DIAGNOSIS — E538 Deficiency of other specified B group vitamins: Secondary | ICD-10-CM | POA: Diagnosis not present

## 2013-04-26 DIAGNOSIS — D649 Anemia, unspecified: Secondary | ICD-10-CM | POA: Diagnosis not present

## 2013-04-26 DIAGNOSIS — R0602 Shortness of breath: Secondary | ICD-10-CM | POA: Diagnosis not present

## 2013-04-26 DIAGNOSIS — N3000 Acute cystitis without hematuria: Secondary | ICD-10-CM | POA: Diagnosis not present

## 2013-04-26 DIAGNOSIS — E039 Hypothyroidism, unspecified: Secondary | ICD-10-CM | POA: Diagnosis not present

## 2013-04-27 ENCOUNTER — Other Ambulatory Visit: Payer: Self-pay | Admitting: Internal Medicine

## 2013-04-27 DIAGNOSIS — R7989 Other specified abnormal findings of blood chemistry: Secondary | ICD-10-CM

## 2013-04-27 DIAGNOSIS — R0602 Shortness of breath: Secondary | ICD-10-CM

## 2013-04-27 DIAGNOSIS — N3000 Acute cystitis without hematuria: Secondary | ICD-10-CM | POA: Diagnosis not present

## 2013-04-27 LAB — CULTURE, BLOOD (ROUTINE X 2): Culture: NO GROWTH

## 2013-04-28 ENCOUNTER — Ambulatory Visit
Admission: RE | Admit: 2013-04-28 | Discharge: 2013-04-28 | Disposition: A | Discharge status: Home / Self Care | Payer: Medicare Other | Source: Ambulatory Visit | Attending: Internal Medicine | Admitting: Internal Medicine

## 2013-04-28 DIAGNOSIS — R0602 Shortness of breath: Secondary | ICD-10-CM

## 2013-04-28 DIAGNOSIS — J9819 Other pulmonary collapse: Secondary | ICD-10-CM | POA: Diagnosis not present

## 2013-04-28 DIAGNOSIS — I2510 Atherosclerotic heart disease of native coronary artery without angina pectoris: Secondary | ICD-10-CM | POA: Diagnosis not present

## 2013-04-28 DIAGNOSIS — R7989 Other specified abnormal findings of blood chemistry: Secondary | ICD-10-CM

## 2013-04-28 MED ORDER — IOHEXOL 350 MG/ML SOLN
100.0000 mL | Freq: Once | INTRAVENOUS | Status: AC | PRN
  Administered 2013-04-28: 100 mL via INTRAVENOUS

## 2013-05-01 ENCOUNTER — Ambulatory Visit (HOSPITAL_COMMUNITY)
Admission: RE | Admit: 2013-05-01 | Discharge: 2013-05-01 | Disposition: A | Discharge status: Home / Self Care | Payer: Medicare Other | Source: Ambulatory Visit | Attending: Internal Medicine | Admitting: Internal Medicine

## 2013-05-01 DIAGNOSIS — Z1231 Encounter for screening mammogram for malignant neoplasm of breast: Secondary | ICD-10-CM | POA: Insufficient documentation

## 2013-05-01 LAB — HM MAMMOGRAPHY

## 2013-05-16 ENCOUNTER — Encounter (INDEPENDENT_AMBULATORY_CARE_PROVIDER_SITE_OTHER): Payer: Self-pay | Admitting: General Surgery

## 2013-05-16 ENCOUNTER — Ambulatory Visit (INDEPENDENT_AMBULATORY_CARE_PROVIDER_SITE_OTHER): Payer: Medicare Other | Admitting: General Surgery

## 2013-05-16 VITALS — BP 110/50 | HR 96 | Resp 14 | Ht 64.0 in | Wt 171.6 lb

## 2013-05-16 DIAGNOSIS — D128 Benign neoplasm of rectum: Secondary | ICD-10-CM | POA: Diagnosis not present

## 2013-05-16 NOTE — Progress Notes (Signed)
Subjective:     Patient ID: Marie [name], female   DOB: 1936/02/08, 77 y.o.   MRN: [id number]  HPI The patient is a 77 year old white female who is one year and 7 months status post resection of a tubulovillous adenoma at the anal verge. She has noticed one or 2 episodes of some blood on her stool in the last few months. She denies any rectal pain. She does have a bowel movement about twice a week and states that that is always the way her bowels are functioning. Since her last visit she is also been hospitalized for dehydration but she is feeling better now.  Review of Systems  Constitutional: Negative.   HENT: Negative.   Eyes: Negative.   Respiratory: Negative.   Cardiovascular: Negative.   Gastrointestinal: Positive for blood in stool. Negative for rectal pain.  Endocrine: Negative.   Genitourinary: Negative.   Musculoskeletal: Negative.   Skin: Negative.   Allergic/Immunologic: Negative.   Neurological: Negative.   Hematological: Negative.   Psychiatric/Behavioral: Negative.        Objective:   Physical Exam  Constitutional: She is oriented to person, place, and time. She appears well-developed and well-nourished.  HENT:  Head: Normocephalic and atraumatic.  Eyes: Conjunctivae and EOM are normal. Pupils are equal, round, and reactive to light.  Neck: Normal range of motion. Neck supple.  Cardiovascular: Normal rate, regular rhythm and normal heart sounds.   Pulmonary/Chest: Effort normal and breath sounds normal.  Abdominal: Soft. Bowel sounds are normal. There is no tenderness.  Genitourinary:  Her perirectal skin looks normal. On digital exam there is no palpable mass and she has poor rectal tone. On anoscopic exam I do not appreciate a mass. She does have a small irritated area in the right posterior position.  Musculoskeletal: Normal range of motion.  Neurological: She is alert and oriented to person, place, and time.  Skin: Skin is warm and dry.  Psychiatric: She  has a normal mood and affect. Her behavior is normal.       Assessment:     The patient is one year and 7 months status post resection of a tubulovillous adenoma from her anal verge. There is no evidence of recurrence today.     Plan:     At this point I will plan to see her back in 6 months for a recheck

## 2013-05-31 DIAGNOSIS — M25469 Effusion, unspecified knee: Secondary | ICD-10-CM | POA: Diagnosis not present

## 2013-06-12 DIAGNOSIS — I10 Essential (primary) hypertension: Secondary | ICD-10-CM | POA: Diagnosis not present

## 2013-06-12 DIAGNOSIS — M542 Cervicalgia: Secondary | ICD-10-CM | POA: Diagnosis not present

## 2013-06-16 ENCOUNTER — Ambulatory Visit (HOSPITAL_COMMUNITY)
Admission: RE | Admit: 2013-06-16 | Discharge: 2013-06-16 | Disposition: A | Discharge status: Home / Self Care | Payer: Medicare Other | Source: Ambulatory Visit | Attending: Orthopedic Surgery | Admitting: Orthopedic Surgery

## 2013-06-16 DIAGNOSIS — I10 Essential (primary) hypertension: Secondary | ICD-10-CM | POA: Diagnosis not present

## 2013-06-16 DIAGNOSIS — IMO0001 Reserved for inherently not codable concepts without codable children: Secondary | ICD-10-CM | POA: Insufficient documentation

## 2013-06-16 DIAGNOSIS — Z9181 History of falling: Secondary | ICD-10-CM | POA: Insufficient documentation

## 2013-06-16 DIAGNOSIS — R269 Unspecified abnormalities of gait and mobility: Secondary | ICD-10-CM | POA: Insufficient documentation

## 2013-06-16 DIAGNOSIS — M25569 Pain in unspecified knee: Secondary | ICD-10-CM | POA: Diagnosis not present

## 2013-06-16 NOTE — Evaluation (Signed)
Physical Therapy Evaluation  Patient Details  Name: Marie [name] MRN: [id number] Date of Birth: [birth year]  Today's Date: 06/16/2013 Time: 1100-1150 PT Time Calculation (min): 50 min Charge eval             Visit#: 1 of 12  Re-eval: 07/16/13 Assessment Diagnosis: R knee OA/ loss of balance  Authorization: medicare    Authorization Time Period:    Authorization Visit#: 1 of 10   Past Medical History:  Past Medical History  Diagnosis Date  . Incontinence   . Osteoarthritis     hands,   . Wears dentures   . Osteoporosis   . Bulging disc   . Hyperlipidemia   . Chronic bronchitis     due to congestion at times, on prednisone and advair  . GERD (gastroesophageal reflux disease)   . Neuropathy     legs stay numb   . Depression     many years ago  . Hypertension     followed by intern med Dr. Kendrick Fries [phone]  . Tubulovillous adenoma of rectum   . Urinary incontinence   . Balance disorder 2008.      Falls a lot.  She can be standing and then leans too far over to one side   . Wears glasses   . Hearing loss     mild  . Iatrogenic adrenal insufficiency    Past Surgical History:  Past Surgical History  Procedure Laterality Date  . Incontinence surgery      multiple procedures, not cured  . Rectal surgery      by dr. Carolynne Edouard, removal of polyp  . Appendectomy    . Abdominal hysterectomy    . Tonsillectomy    . Eye surgery      bilateral cataract surgery and lens implant  . Dilation and curettage of uterus    . Rectal biopsy  09/21/2011    Procedure: BIOPSY RECTAL;  Surgeon: Robyne Askew, MD;  Location: [facility] OR;  Service: General;  Laterality: N/A;  3-4 cm  . Finger surgery      fusions and debridements for OA  . External fixation leg  10/25/2012    Procedure: EXTERNAL FIXATION LEG;  Surgeon: Budd Palmer, MD;  Location: [facility] OR;  Service: Orthopedics;  Laterality: Right;    Subjective Symptoms/Limitations Symptoms: Marie [name] states that she has had fluid on her  knee for three weeks now.  She states that the MD did not inject or give her any medication but stated that she should be doing some exercises to help pump the fluid out.  She is now being referred to PT to decrease her swelling. The patient states that her knee hurts her immedicately and that occasionally her knee gives way on her . How long can you sit comfortably?: no problem How long can you stand comfortably?: able to stand for five minutes How long can you walk comfortably?: Pt is using a walker and a cane she is not walking much due to the fact that she falls very easily due to decreased balance.  Pain Assessment Currently in Pain?: Yes Pain Score: 5  Pain Location: Knee Pain Orientation: Right Pain Onset: Today  Balance Screening Balance Screen Has the patient fallen in the past 6 months: Yes How many times?: over 20 Has the patient had a decrease in activity level because of a fear of falling? : Yes Is the patient reluctant to leave their home because of a fear of falling? :  Yes     Sensation/Coordination/Flexibility/Functional Tests Functional Tests Functional Tests: LEFS 11/80  Assessment RLE AROM (degrees) Right Knee Extension: 0 Right Knee Flexion: 130 RLE Strength Right Hip Flexion: 3/5 Right Hip Extension: 2+/5 Right Hip ABduction: 2+/5 Right Knee Flexion: 5/5 Right Knee Extension:  (4-/5) Right Ankle Dorsiflexion: 4/5 LLE Strength Left Hip Flexion: 4/5 Left Hip Extension: 2+/5 Left Hip ABduction: 3-/5 Left Knee Flexion: 5/5 Left Knee Extension: 5/5 Left Ankle Dorsiflexion: 5/5  Exercise/Treatments Mobility/Balance  Berg Balance Test Sit to Stand: Able to stand using hands after several tries Standing Unsupported: Able to stand 2 minutes with supervision Sitting with Back Unsupported but Feet Supported on Floor or Stool: Able to sit safely and securely 2 minutes Stand to Sit: Controls descent by using hands Transfers: Able to transfer safely, definite  need of hands Standing Unsupported with Eyes Closed: Able to stand 10 seconds with supervision Standing Ubsupported with Feet Together: Able to place feet together independently and stand for 1 minute with supervision From Standing, Reach Forward with Outstretched Arm: Can reach forward >12 cm safely (5") From Standing Position, Pick up Object from Floor: Able to pick up shoe, needs supervision From Standing Position, Turn to Look Behind Over each Shoulder: Turn sideways only but maintains balance Turn 360 Degrees: Able to turn 360 degrees safely but slowly Standing Unsupported, Alternately Place Feet on Step/Stool: Able to complete >2 steps/needs minimal assist Standing Unsupported, One Foot in Front: Able to take small step independently and hold 30 seconds Standing on One Leg: Tries to lift leg/unable to hold 3 seconds but remains standing independently Total Score: 35 Timed Up and Go Test TUG: Normal TUG Normal TUG (seconds): 32       Seated Long Arc Quad: 5 reps Supine Bridges: 5 reps Straight Leg Raises: 5 reps Sidelying Hip ABduction: 5 reps Prone  Hamstring Curl: 5 reps Hip Extension: 5 reps    Physical Therapy Assessment and Plan PT Assessment and Plan Clinical Impression Statement: Pt to department with complaint of Right knee pain and swelling.  Pt states that she is falling every week.  Therapist notes that when pt is standing she tends to bear 75% wt on Rt ; 25% on LT.  Pt has significant decreased strength, balance and pain.  Pt will benefit from skilled  PT to address the above deficiets.  Therapist spoke to pt about needing to improve her balance as if she continues to fall she will continue to strain and sprain her knee.   Pt agreeable to working with therapy for a four week period of time. Pt will benefit from skilled therapeutic intervention in order to improve on the following deficits: Decreased activity tolerance;Decreased balance;Decreased strength;Difficulty  walking;Pain;Increased edema Rehab Potential: Good PT Frequency: Min 3X/week PT Duration: 4 weeks PT Treatment/Interventions: Gait training;Therapeutic activities;Therapeutic exercise;Functional mobility training PT Plan: begin step ups, nustep, high marching without holding on, SLS, tandem gt, retrogtl, lunging, heel raises, mini squats...    Goals Home Exercise Program Pt/caregiver will Perform Home Exercise Program: For increased strengthening PT Goal: Perform Home Exercise Program - Progress: Goal set today PT Short Term Goals Time to Complete Short Term Goals: 2 weeks PT Short Term Goal 1: Pt to be able to come sit to stand in one attempt 90% of the time when using hands. PT Short Term Goal 2: Pt to be able to stand for 10 minues to be able to make a sandwich PT Short Term Goal 3: Pt to be using a  walker at all times to walk with PT Long Term Goals Time to Complete Long Term Goals: 4 weeks PT Long Term Goal 1: Pt I in advance HEP PT Long Term Goal 2: Pt Berg score to be improved by 10 pts to allow pt to be able to walk with a cane safely Long Term Goal 3: Pt to be able to stand for 20 minutes to make a small meal Long Term Goal 4: Pt to report that she has not fallen in two weeks PT Long Term Goal 5: Pt to state the pain in her Rt knee is  no greater that a 2/10 80% of the day.  Additional PT Long Term Goals?: Yes PT Long Term Goal 6: Pt TUG to be improved by 10 seconds to decrease pt risk of falling  Problem List Patient Active Problem List   Diagnosis Date Noted  . Difficulty in walking(719.7) 06/16/2013  . Bilateral leg weakness 06/16/2013  . Dehydration 04/21/2013  . CAP (community acquired pneumonia) 04/20/2013  . UTI (lower urinary tract infection) 10/27/2012  . Closed trimalleolar fracture of right ankle 10/25/2012  . Frequent falls 10/25/2012  . Incontinence   . Osteoarthritis   . Hyperlipidemia   . Chronic bronchitis   . GERD (gastroesophageal reflux disease)    . Neuropathy   . Hypertension   . Balance disorder   . Wears glasses   . Hearing loss   . Iatrogenic adrenal insufficiency   . Tubulovillous adenoma of rectum 09/04/2011    PT Plan of Care PT Home Exercise Plan: given  GP Functional Assessment Tool Used: LEFS  Functional Limitation: Self care;Mobility: Walking and moving around Mobility: Walking and Moving Around Current Status [phone]): At least 80 percent but less than 100 percent impaired, limited or restricted Mobility: Walking and Moving Around Goal Status [phone]): At least 40 percent but less than 60 percent impaired, limited or restricted  Nathaneal Sommers,CINDY 06/16/2013, 4:46 PM  Physician Documentation Your signature is required to indicate approval of the treatment plan as stated above.  Please sign and either send electronically or make a copy of this report for your files and return this physician signed original.   Please mark one 1.__approve of plan  2. ___approve of plan with the following conditions.   ______________________________                                                          _____________________ Physician Signature                                                                                                             Date

## 2013-06-21 ENCOUNTER — Ambulatory Visit (HOSPITAL_COMMUNITY): Payer: Medicare Other | Admitting: *Deleted

## 2013-06-23 ENCOUNTER — Ambulatory Visit (HOSPITAL_COMMUNITY)
Admission: RE | Admit: 2013-06-23 | Discharge: 2013-06-23 | Disposition: A | Discharge status: Home / Self Care | Payer: Medicare Other | Source: Ambulatory Visit | Attending: Internal Medicine | Admitting: Internal Medicine

## 2013-06-23 DIAGNOSIS — R269 Unspecified abnormalities of gait and mobility: Secondary | ICD-10-CM | POA: Insufficient documentation

## 2013-06-23 DIAGNOSIS — M25569 Pain in unspecified knee: Secondary | ICD-10-CM | POA: Insufficient documentation

## 2013-06-23 DIAGNOSIS — I10 Essential (primary) hypertension: Secondary | ICD-10-CM | POA: Insufficient documentation

## 2013-06-23 DIAGNOSIS — R29898 Other symptoms and signs involving the musculoskeletal system: Secondary | ICD-10-CM

## 2013-06-23 DIAGNOSIS — IMO0001 Reserved for inherently not codable concepts without codable children: Secondary | ICD-10-CM | POA: Diagnosis not present

## 2013-06-23 DIAGNOSIS — R262 Difficulty in walking, not elsewhere classified: Secondary | ICD-10-CM

## 2013-06-23 DIAGNOSIS — Z9181 History of falling: Secondary | ICD-10-CM | POA: Insufficient documentation

## 2013-06-23 NOTE — Progress Notes (Signed)
Physical Therapy Treatment Patient Details  Name: Marie [name] MRN: [id number] Date of Birth: Apr 18, 1936  Today's Date: 06/23/2013 Time: 4098-1191 PT Time Calculation (min): 42 min Charge: TE 1303-1325, NMR 4782-9562  Visit#: 2 of 12  Re-eval: 07/16/13 Assessment Diagnosis: Rt knee OA/ LOB  Authorization: medicare  Authorization Time Period:    Authorization Visit#: 2 of 10   Subjective: Symptoms/Limitations Symptoms: Pt stated compliance with HEP with no questions about the exercises.  Pt reported intemittent pain Rt knee that increases with standing.  Pt reports she feels faint today, husband reports low BP this morning.  Pain Assessment Currently in Pain?: Yes Pain Score: 5  Pain Location: Knee Pain Orientation: Right  Objective:   Exercise/Treatments Aerobic Stationary Bike: nustep 10 minutes hill level 3 resistance level 2 SPM >80 Standing Heel Raises: 10 reps;Limitations Heel Raises Limitations: toe raises 10 reps Forward Step Up: Both;10 reps;Hand Hold: 1;Step Height: 4" SLS: 3x 15" with 1 finger HHA and min-mod assistance  Other Standing Knee Exercises: Tandem, retro and side stepping 1 RT with min-mod assistance Other Standing Knee Exercises: 5 STS without HHA      Physical Therapy Assessment and Plan PT Assessment and Plan Clinical Impression Statement: Treatment focus on LE strengthening and balance activities.  Pt required min-mod assistance with all exercises for unsteadiness from her weak LE.  Pt required multiple episodes of rest breaks due to fatigue and c/o dizziness.   PT Plan: Continue with current POC, progress to lunging when ready.    Goals    Problem List Patient Active Problem List   Diagnosis Date Noted  . Difficulty in walking(719.7) 06/16/2013  . Bilateral leg weakness 06/16/2013  . Dehydration 04/21/2013  . CAP (community acquired pneumonia) 04/20/2013  . UTI (lower urinary tract infection) 10/27/2012  . Closed trimalleolar  fracture of right ankle 10/25/2012  . Frequent falls 10/25/2012  . Incontinence   . Osteoarthritis   . Hyperlipidemia   . Chronic bronchitis   . GERD (gastroesophageal reflux disease)   . Neuropathy   . Hypertension   . Balance disorder   . Wears glasses   . Hearing loss   . Iatrogenic adrenal insufficiency   . Tubulovillous adenoma of rectum 09/04/2011    PT - End of Session Equipment Utilized During Treatment: Gait belt Activity Tolerance: Patient tolerated treatment well General Behavior During Therapy: [facility] for tasks assessed/performed  GP    Juel Burrow 06/23/2013, 2:27 PM

## 2013-06-26 ENCOUNTER — Ambulatory Visit (HOSPITAL_COMMUNITY)
Admission: RE | Admit: 2013-06-26 | Discharge: 2013-06-26 | Disposition: A | Discharge status: Home / Self Care | Payer: Medicare Other | Source: Ambulatory Visit | Attending: Orthopedic Surgery | Admitting: Orthopedic Surgery

## 2013-06-26 NOTE — Progress Notes (Signed)
Physical Therapy Treatment Patient Details  Name: ERLENE DEVITA MRN: [id number] Date of Birth: 1935/12/12  Today's Date: 06/26/2013 Time: 1100-1150 PT Time Calculation (min): 50 min Charges: Therex x 25' (1100-1125) NMR x 20' (1127-1147)  Visit#: 3 of 12  Re-eval: 07/16/13  Authorization: medicare  Authorization Visit#: 3 of 10   Subjective: Symptoms/Limitations Symptoms: Pt states that she is pain free. She states her main concern is her balance. Pain Assessment Currently in Pain?: No/denies   Exercise/Treatments Aerobic Stationary Bike: NuStep 10 minutes hill level 3 resistance level 2 SPM >80 to improve strength Standing Heel Raises: 10 reps;Limitations Heel Raises Limitations: toe raises 10 reps Lateral Step Up: 10 reps;Hand Hold: 2;Step Height: 4";Both Functional Squat: 10 reps Rocker Board: 2 minutes;Limitations Rocker Board Limitations: R/L Other Standing Knee Exercises: Tandem, retro and side stepping 1 RT with min-mod assistance; Tandem stance 1' bilateral with mod assist and HHA PRN Other Standing Knee Exercises: 5 STS without HHA Seated Long Arc Quad: 10 reps;Weights;Both Long Arc Quad Weight: 3 lbs.  Physical Therapy Assessment and Plan PT Assessment and Plan Clinical Impression Statement: Pt appears to be most limited by weakness and decreased balance. Pt requires mod assist with most activities to maintain balance. Pt ambulates into therapy without AD. She states taht she left her walker in the car. Advised pt to bring walker next session. Pt ambulates with wide BOS and decreased knee/hip flexion.  Pt is without complaint of dizziness this session. PT Plan: Continue to progress strength and stability per PT POC.     Problem List Patient Active Problem List   Diagnosis Date Noted  . Difficulty in walking(719.7) 06/16/2013  . Bilateral leg weakness 06/16/2013  . Dehydration 04/21/2013  . CAP (community acquired pneumonia) 04/20/2013  . UTI (lower urinary  tract infection) 10/27/2012  . Closed trimalleolar fracture of right ankle 10/25/2012  . Frequent falls 10/25/2012  . Incontinence   . Osteoarthritis   . Hyperlipidemia   . Chronic bronchitis   . GERD (gastroesophageal reflux disease)   . Neuropathy   . Hypertension   . Balance disorder   . Wears glasses   . Hearing loss   . Iatrogenic adrenal insufficiency   . Tubulovillous adenoma of rectum 09/04/2011    PT - End of Session Equipment Utilized During Treatment: Gait belt Activity Tolerance: Patient tolerated treatment well General Behavior During Therapy: [facility] for tasks assessed/performed  Seth Bake, PTA  06/26/2013, 12:04 PM

## 2013-06-28 ENCOUNTER — Ambulatory Visit (HOSPITAL_COMMUNITY)
Admission: RE | Admit: 2013-06-28 | Discharge: 2013-06-28 | Disposition: A | Discharge status: Home / Self Care | Payer: Medicare Other | Source: Ambulatory Visit | Attending: Internal Medicine | Admitting: Internal Medicine

## 2013-06-28 DIAGNOSIS — R29898 Other symptoms and signs involving the musculoskeletal system: Secondary | ICD-10-CM

## 2013-06-28 DIAGNOSIS — R262 Difficulty in walking, not elsewhere classified: Secondary | ICD-10-CM

## 2013-06-28 NOTE — Progress Notes (Signed)
Physical Therapy Treatment Patient Details  Name: Marie [name] MRN: [id number] Date of Birth: Apr 07, 1936  Today's Date: 06/28/2013 Time: 1102-1200 PT Time Calculation (min): 58 min Charge: TE 1102-1150, Manual 1150-1200  Visit#: 4 of 12  Re-eval: 07/16/13 Assessment Diagnosis: Rt knee OA/ LOB Next MD Visit: Carola Frost MD unscheduled  Authorization: medicare  Authorization Time Period:    Authorization Visit#: 4 of 10   Subjective: Symptoms/Limitations Symptoms: Pt entered dept ambulating with RW.  Pt reports pain increases with standing, noted increased edema Rt knee today. Pain Assessment Currently in Pain?: Yes Pain Score: 10-Worst pain ever (with weight bearing) Pain Location: Knee Pain Orientation: Right  Objective:  Exercise/Treatments Aerobic Stationary Bike: NuStep 10 minutes hill level 3 resistance level 2 SPM >80 Standing Heel Raises: 15 reps;Limitations Heel Raises Limitations: toe raises 15 reps Lateral Step Up: 10 reps;Hand Hold: 2;Step Height: 4";Both Forward Step Up: Both;10 reps;Hand Hold: 1;Step Height: 4" Functional Squat: 15 reps Rocker Board: 2 minutes;Limitations Rocker Board Limitations: R/L, A/P and keeping level x 2 minutes SLS: 3x 15" with 1 finger HHA and min-mod assistance  Other Standing Knee Exercises: Tandem, retro and side stepping 1 RT with min-mod assistance; Tandem stance 1' bilateral with mod assist and HHA PRN Other Standing Knee Exercises: 5 STS without HHA  Manual Therapy Manual Therapy: Edema management Edema Management: Retro massage for edema control, supine position with LE elevated  Physical Therapy Assessment and Plan PT Assessment and Plan Clinical Impression Statement: Continued gait training with RW to ensure safe mechanics while walking.  Mod assistance required wtih balance activities this session for LOB episodes.  Progressed activiteis with rocker board this session to improve balance and hip mobilty/strengthening.   Ended session with retro massage to reduce swelling.  Pt educated on benefits of compression hose.  PT Plan: Continue to progress strength and stability per PT POC.  F/U with pt about compression hose for edema control, send referral if pt is interested.      Goals    Problem List Patient Active Problem List   Diagnosis Date Noted  . Difficulty in walking(719.7) 06/16/2013  . Bilateral leg weakness 06/16/2013  . Dehydration 04/21/2013  . CAP (community acquired pneumonia) 04/20/2013  . UTI (lower urinary tract infection) 10/27/2012  . Closed trimalleolar fracture of right ankle 10/25/2012  . Frequent falls 10/25/2012  . Incontinence   . Osteoarthritis   . Hyperlipidemia   . Chronic bronchitis   . GERD (gastroesophageal reflux disease)   . Neuropathy   . Hypertension   . Balance disorder   . Wears glasses   . Hearing loss   . Iatrogenic adrenal insufficiency   . Tubulovillous adenoma of rectum 09/04/2011    PT - End of Session Equipment Utilized During Treatment: Gait belt Activity Tolerance: Patient tolerated treatment well General Behavior During Therapy: [facility] for tasks assessed/performed  GP    Juel Burrow 06/28/2013, 4:34 PM

## 2013-06-30 ENCOUNTER — Ambulatory Visit (HOSPITAL_COMMUNITY)
Admission: RE | Admit: 2013-06-30 | Discharge: 2013-06-30 | Disposition: A | Discharge status: Home / Self Care | Payer: Medicare Other | Source: Ambulatory Visit | Attending: Internal Medicine | Admitting: Internal Medicine

## 2013-06-30 DIAGNOSIS — R29898 Other symptoms and signs involving the musculoskeletal system: Secondary | ICD-10-CM

## 2013-06-30 DIAGNOSIS — R262 Difficulty in walking, not elsewhere classified: Secondary | ICD-10-CM

## 2013-06-30 NOTE — Progress Notes (Signed)
Physical Therapy Treatment Patient Details  Name: Marie [name] MRN: [id number] Date of Birth: 1936-04-12  Today's Date: 06/30/2013 Time: 0865-7846 PT Time Calculation (min): 46 min Charge: TE 1110-1133, NMR 9629-5284  Visit#: 5 of 152  Re-eval: 07/16/13 Assessment Diagnosis: Rt knee OA/ LOB Next MD Visit: Carola Frost MD unscheduled  Authorization: medicare  Authorization Time Period:    Authorization Visit#: 5 of 10   Subjective: Symptoms/Limitations Symptoms: Pt entered dept with RW, pt stated Rt knee pain scale 5/10 when standing.   Pain Assessment Currently in Pain?: Yes Pain Score: 5  Pain Location: Knee Pain Orientation: Right  Objective:  Exercise/Treatments Aerobic Stationary Bike: NuStep 10 minutes hill level 3 resistance level 3 SPM >80 Standing Heel Raises: 15 reps;Limitations Heel Raises Limitations: toe raises 15 reps Knee Flexion: Both Functional Squat: 15 reps Other Standing Knee Exercises: 5 STS without HHA   Balance Exercises Standing Standing Eyes Opened: Narrow base of support (BOS);Solid surface;5 reps;Limitations Standing Eyes Opened Limitations: [facility] BOS Tandem Stance: 3 reps;30 secs;Eyes open Tandem Gait: Forward;2 reps Sidestepping: 2 reps Turning: Both;5 reps Cone Rotation: Solid surface;A/P;R/L Marching: Solid surface;5 reps;Limitations Marching Limitations: 5" holds     Physical Therapy Assessment and Plan PT Assessment and Plan Clinical Impression Statement: Session focus on improving spatial awareness on static surface to improve balance to reduce risk of falls and strengthening exercises.   Pt required mod assistance with balance activities and vc-ing to improve spatial awareness.  Following discussion with pt about compression hose pt stated she does not wish to purchase hose due to cost.   PT Plan: Continue to progress strength and stability per PT POC.  F/U with pt about compression hose for edema control, send  referral if pt is interested.      Goals Home Exercise Program Pt/caregiver will Perform Home Exercise Program: For increased strengthening PT Short Term Goals Time to Complete Short Term Goals: 2 weeks PT Short Term Goal 1: Pt to be able to come sit to stand in one attempt 90% of the time when using hands. PT Short Term Goal 1 - Progress: Progressing toward goal PT Short Term Goal 2: Pt to be able to stand for 10 minues to be able to make a sandwich PT Short Term Goal 2 - Progress: Progressing toward goal PT Short Term Goal 3: Pt to be using a walker at all times to walk with PT Short Term Goal 3 - Progress: Progressing toward goal PT Long Term Goals Time to Complete Long Term Goals: 4 weeks PT Long Term Goal 1: Pt I in advance HEP PT Long Term Goal 2: Pt Berg score to be improved by 10 pts to allow pt to be able to walk with a cane safely PT Long Term Goal 2 - Progress: Progressing toward goal Long Term Goal 3: Pt to be able to stand for 20 minutes to make a small meal Long Term Goal 3 Progress: Progressing toward goal Long Term Goal 4: Pt to report that she has not fallen in two weeks PT Long Term Goal 5: Pt to state the pain in her Rt knee is  no greater that a 2/10 80% of the day.  Additional PT Long Term Goals?: Yes PT Long Term Goal 6: Pt TUG to be improved by 10 seconds to decrease pt risk of falling  Problem List Patient Active Problem List   Diagnosis Date Noted  . Difficulty in walking(719.7) 06/16/2013  . Bilateral leg weakness 06/16/2013  .  Dehydration 04/21/2013  . CAP (community acquired pneumonia) 04/20/2013  . UTI (lower urinary tract infection) 10/27/2012  . Closed trimalleolar fracture of right ankle 10/25/2012  . Frequent falls 10/25/2012  . Incontinence   . Osteoarthritis   . Hyperlipidemia   . Chronic bronchitis   . GERD (gastroesophageal reflux disease)   . Neuropathy   . Hypertension   . Balance disorder   . Wears glasses   . Hearing loss   .  Iatrogenic adrenal insufficiency   . Tubulovillous adenoma of rectum 09/04/2011    PT - End of Session Equipment Utilized During Treatment: Gait belt Activity Tolerance: Patient tolerated treatment well;Patient limited by fatigue General Behavior During Therapy: [facility] for tasks assessed/performed  GP    Juel Burrow 06/30/2013, 12:22 PM

## 2013-07-03 ENCOUNTER — Ambulatory Visit (HOSPITAL_COMMUNITY)
Admission: RE | Admit: 2013-07-03 | Discharge: 2013-07-03 | Disposition: A | Discharge status: Home / Self Care | Payer: Medicare Other | Source: Ambulatory Visit | Attending: Internal Medicine | Admitting: Internal Medicine

## 2013-07-03 DIAGNOSIS — M25469 Effusion, unspecified knee: Secondary | ICD-10-CM | POA: Diagnosis not present

## 2013-07-03 NOTE — Progress Notes (Signed)
  Patient Details  Name: MARIBELL DEMEO MRN: [id number] Date of Birth: 03/17/1936  Today's Date: 07/03/2013 Pt comes to therapy with note from MD requesting to hold exercises this session and complete modalities only as needed. Pt is currently not in any pain therefor modalities are unwarranted. Treatment cancelled for today. No charge.  Seth Bake, PTA  07/03/2013, 11:07 AM

## 2013-07-05 ENCOUNTER — Ambulatory Visit (HOSPITAL_COMMUNITY)
Admission: RE | Admit: 2013-07-05 | Discharge: 2013-07-05 | Disposition: A | Discharge status: Home / Self Care | Payer: Medicare Other | Source: Ambulatory Visit | Attending: Internal Medicine | Admitting: Internal Medicine

## 2013-07-05 DIAGNOSIS — H02839 Dermatochalasis of unspecified eye, unspecified eyelid: Secondary | ICD-10-CM | POA: Diagnosis not present

## 2013-07-05 DIAGNOSIS — D2310 Other benign neoplasm of skin of unspecified eyelid, including canthus: Secondary | ICD-10-CM | POA: Diagnosis not present

## 2013-07-05 DIAGNOSIS — R262 Difficulty in walking, not elsewhere classified: Secondary | ICD-10-CM

## 2013-07-05 DIAGNOSIS — R29898 Other symptoms and signs involving the musculoskeletal system: Secondary | ICD-10-CM

## 2013-07-05 DIAGNOSIS — D3130 Benign neoplasm of unspecified choroid: Secondary | ICD-10-CM | POA: Diagnosis not present

## 2013-07-05 DIAGNOSIS — Z961 Presence of intraocular lens: Secondary | ICD-10-CM | POA: Diagnosis not present

## 2013-07-05 NOTE — Progress Notes (Signed)
Physical Therapy Treatment Patient Details  Name: Marie [name] MRN: [id number] Date of Birth: 1936-09-07  Today's Date: 07/05/2013 Time: 7829-5621 PT Time Calculation (min): 48 min Charge: TE 23' 1110-1133, NMR 25' 1133-1158  Visit#: 6 of 12  Re-eval: 07/16/13 Assessment Diagnosis: Rt knee OA/ LOB Next MD Visit: Carola Frost MD unscheduled  Authorization: medicare  Authorization Time Period:    Authorization Visit#: 6 of 10   Subjective: Symptoms/Limitations Symptoms: Pt entered dept ambulating with RW, stated pain free today. Pain Assessment Currently in Pain?: No/denies  Precautions/Restrictions  Precautions Precautions: None  Exercise/Treatments Aerobic Stationary Bike: NuStep 10 minutes hill level 3 resistance level 3 SPM >100 Standing Heel Raises: 15 reps;Limitations Heel Raises Limitations: toe raises 15 reps Functional Squat: 15 reps  Balance Exercises Standing Standing Eyes Opened: Narrow base of support (BOS);5 reps Standing Eyes Opened Limitations: NBOS finding center BOS Tandem Stance: 3 reps;30 secs;Eyes open Tandem Gait: Forward;2 reps Retro Gait: 1 rep Sidestepping: 2 reps Numbers 1-15: Balance Beam;1 rep Marching: Solid surface;5 reps;Limitations Marching Limitations: 5" holds   Physical Therapy Assessment and Plan PT Assessment and Plan Clinical Impression Statement: Pt improved abiility finding center of mass, less assistance required with balance activities this session.  Added dynamic balance activity to improve reaching outside BOS with min assistance required and vc-ing to improve spatial awareness with activity.  Improved activity tolerance, only required 1 rest break through sessoin today.   PT Plan: Continue to progress strength and stability per PT POC.     Goals PT Short Term Goals PT Short Term Goal 1 - Progress: Progressing toward goal PT Short Term Goal 2 - Progress: Progressing toward goal PT Short Term Goal 3 - Progress: Progressing  toward goal  Problem List Patient Active Problem List   Diagnosis Date Noted  . Difficulty in walking(719.7) 06/16/2013  . Bilateral leg weakness 06/16/2013  . Dehydration 04/21/2013  . CAP (community acquired pneumonia) 04/20/2013  . UTI (lower urinary tract infection) 10/27/2012  . Closed trimalleolar fracture of right ankle 10/25/2012  . Frequent falls 10/25/2012  . Incontinence   . Osteoarthritis   . Hyperlipidemia   . Chronic bronchitis   . GERD (gastroesophageal reflux disease)   . Neuropathy   . Hypertension   . Balance disorder   . Wears glasses   . Hearing loss   . Iatrogenic adrenal insufficiency   . Tubulovillous adenoma of rectum 09/04/2011    PT - End of Session Equipment Utilized During Treatment: Gait belt Activity Tolerance: Patient limited by fatigue;Patient tolerated treatment well General Behavior During Therapy: [facility] for tasks assessed/performed  GP    Juel Burrow 07/05/2013, 12:03 PM

## 2013-07-07 ENCOUNTER — Ambulatory Visit (HOSPITAL_COMMUNITY)
Admission: RE | Admit: 2013-07-07 | Discharge: 2013-07-07 | Disposition: A | Discharge status: Home / Self Care | Payer: Medicare Other | Source: Ambulatory Visit | Attending: Internal Medicine | Admitting: Internal Medicine

## 2013-07-07 DIAGNOSIS — R262 Difficulty in walking, not elsewhere classified: Secondary | ICD-10-CM

## 2013-07-07 DIAGNOSIS — R29898 Other symptoms and signs involving the musculoskeletal system: Secondary | ICD-10-CM

## 2013-07-07 NOTE — Progress Notes (Signed)
Physical Therapy Treatment Patient Details  Name: Marie [name] MRN: [id number] Date of Birth: [birth year]  Today's Date: 07/07/2013 Time: 4098-1191 PT Time Calculation (min): 48 min Charge: Self care [phone], TE 1116-1140, NMR 1140-1156  Visit#: 7 of 12  Re-eval: 07/16/13 Assessment Diagnosis: Rt knee OA/ LOB Next MD Visit: Carola Frost MD unscheduled  Authorization: medicare  Authorization Time Period:    Authorization Visit#: 7 of 10   Subjective: Symptoms/Limitations Symptoms: Pt stated no real pain but does c/o swelling Rt knee. Pain Assessment Currently in Pain?: No/denies  Precautions/Restrictions  Precautions Precautions: None  Exercise/Treatments Aerobic Stationary Bike: NuStep 10 minutes hill level 3 resistance level 3 SPM >100 Standing Heel Raises: 15 reps;Limitations Heel Raises Limitations: toe raises 15 reps Knee Flexion: Both;15 reps;Limitations Knee Flexion Limitations: 3# Lateral Step Up: 15 reps;Hand Hold: 1;Step Height: 4";Both Forward Step Up: Both;15 reps;Hand Hold: 1;Step Height: 4" Functional Squat: 15 reps Other Standing Knee Exercises: 5 STS without HHA  Balance Exercises Standing Tandem Stance: Eyes open;Intermittent HHA;3 reps;30 secs;Limitations Tandem Stance Limitations: 2 sets with partial tandem stance on 4 in step Tandem Gait: Forward;2 reps Retro Gait: 1 rep Sidestepping: 2 reps Marching: Solid surface;10 reps;Limitations Marching Limitations: 5" holds   Physical Therapy Assessment and Plan PT Assessment and Plan Clinical Impression Statement: Following discussion with pt about benefits of compression hose to assist with edema to Rt knee referral was sent to MD.  Pt educated on techniques to remember to complete HEP at home, explained benefits of completeing HEP and given another copy. Improved static standing balance, able to progress to 4 in step tandem stance with min assistance required.   PT Plan: Continue to progress strength  and stability per PT POC. F/U with referral for compression hose next session.      Goals Home Exercise Program Pt/caregiver will Perform Home Exercise Program: For increased strengthening PT Short Term Goals Time to Complete Short Term Goals: 2 weeks PT Short Term Goal 1: Pt to be able to come sit to stand in one attempt 90% of the time when using hands. PT Short Term Goal 1 - Progress: Progressing toward goal PT Short Term Goal 2: Pt to be able to stand for 10 minues to be able to make a sandwich PT Short Term Goal 3: Pt to be using a walker at all times to walk with PT Long Term Goals Time to Complete Long Term Goals: 4 weeks PT Long Term Goal 1: Pt I in advance HEP PT Long Term Goal 2: Pt Berg score to be improved by 10 pts to allow pt to be able to walk with a cane safely Long Term Goal 3: Pt to be able to stand for 20 minutes to make a small meal Long Term Goal 4: Pt to report that she has not fallen in two weeks PT Long Term Goal 5: Pt to state the pain in her Rt knee is  no greater that a 2/10 80% of the day.  Additional PT Long Term Goals?: Yes PT Long Term Goal 6: Pt TUG to be improved by 10 seconds to decrease pt risk of falling  Problem List Patient Active Problem List   Diagnosis Date Noted  . Difficulty in walking(719.7) 06/16/2013  . Bilateral leg weakness 06/16/2013  . Dehydration 04/21/2013  . CAP (community acquired pneumonia) 04/20/2013  . UTI (lower urinary tract infection) 10/27/2012  . Closed trimalleolar fracture of right ankle 10/25/2012  . Frequent falls 10/25/2012  . Incontinence   .  Osteoarthritis   . Hyperlipidemia   . Chronic bronchitis   . GERD (gastroesophageal reflux disease)   . Neuropathy   . Hypertension   . Balance disorder   . Wears glasses   . Hearing loss   . Iatrogenic adrenal insufficiency   . Tubulovillous adenoma of rectum 09/04/2011       GP    Juel Burrow 07/07/2013, 2:34 PM

## 2013-07-10 ENCOUNTER — Ambulatory Visit (HOSPITAL_COMMUNITY)
Admission: RE | Admit: 2013-07-10 | Discharge: 2013-07-10 | Disposition: A | Discharge status: Home / Self Care | Payer: Medicare Other | Source: Ambulatory Visit | Attending: Internal Medicine | Admitting: Internal Medicine

## 2013-07-10 NOTE — Progress Notes (Signed)
Physical Therapy Treatment Patient Details  Name: MATHEA FRIELING MRN: [id number] Date of Birth: [birth year]  Today's Date: 07/10/2013 Time: 5621-3086 PT Time Calculation (min): 42 min Visit#: 8 of 12  Re-eval: 07/16/13 Charges:  therex 08-1129 (30'), ice 1131-1141 (10') Authorization: medicare  Authorization Visit#: 8 of 10   Subjective: Symptoms/Limitations Symptoms: Pt states her rt knee continues to swell.  Mostly with discomfort but states pain increases terribly with ambulation. Pain Assessment Currently in Pain?: Yes Pain Score: 6  Pain Location: Knee Pain Orientation: Right Pain Onset: Other (comment) (Pain with ambulation)   Exercise/Treatments Aerobic Stationary Bike: NuStep 10 minutes hill level 3 resistance level 3 SPM >100 Standing Heel Raises: 15 reps;Limitations Heel Raises Limitations: toe raises 15 reps Knee Flexion: Both;15 reps;Limitations Knee Flexion Limitations: 3# Lateral Step Up: 15 reps;Hand Hold: 1;Step Height: 4";Both Forward Step Up: Both;15 reps;Hand Hold: 1;Step Height: 4" Other Standing Knee Exercises: 5 STS without HHA   Modalities Modalities: Cryotherapy Cryotherapy Number Minutes Cryotherapy: 10 Minutes Cryotherapy Location: Knee Type of Cryotherapy: Ice pack  Physical Therapy Assessment and Plan PT Assessment and Plan Clinical Impression Statement: order not received yet for compression hose.  PT continues to have edema in Rt knee.  Discussed retro massage, however pt has done previously and increased pain .  Added ice today at end of session to help decrease pain and swelling.  PT reported improvement at end of session.  PT Plan: Continue to progress strength and stability per PT POC. F/U with referral for compression hose next session.       Problem List Patient Active Problem List   Diagnosis Date Noted  . Difficulty in walking(719.7) 06/16/2013  . Bilateral leg weakness 06/16/2013  . Dehydration 04/21/2013  . CAP (community  acquired pneumonia) 04/20/2013  . UTI (lower urinary tract infection) 10/27/2012  . Closed trimalleolar fracture of right ankle 10/25/2012  . Frequent falls 10/25/2012  . Incontinence   . Osteoarthritis   . Hyperlipidemia   . Chronic bronchitis   . GERD (gastroesophageal reflux disease)   . Neuropathy   . Hypertension   . Balance disorder   . Wears glasses   . Hearing loss   . Iatrogenic adrenal insufficiency   . Tubulovillous adenoma of rectum 09/04/2011       Lurena Nida, PTA/CLT 07/10/2013, 11:39 AM

## 2013-07-12 ENCOUNTER — Ambulatory Visit (HOSPITAL_COMMUNITY)
Admission: RE | Admit: 2013-07-12 | Discharge: 2013-07-12 | Disposition: A | Discharge status: Home / Self Care | Payer: Medicare Other | Source: Ambulatory Visit | Attending: Internal Medicine | Admitting: Internal Medicine

## 2013-07-12 DIAGNOSIS — R29898 Other symptoms and signs involving the musculoskeletal system: Secondary | ICD-10-CM

## 2013-07-12 DIAGNOSIS — R262 Difficulty in walking, not elsewhere classified: Secondary | ICD-10-CM

## 2013-07-12 DIAGNOSIS — Z23 Encounter for immunization: Secondary | ICD-10-CM | POA: Diagnosis not present

## 2013-07-12 NOTE — Progress Notes (Signed)
Physical Therapy Treatment Patient Details  Name: Marie [name] MRN: [id number] Date of Birth: 1936/01/07  Today's Date: 07/12/2013 Time: 1110-1155 PT Time Calculation (min): 45 min Charge there ex 1110-1155 Visit#: 9 of 12  Re-eval: 07/16/13    Authorization: medicare  Authorization Visit#: 9 of 10   Subjective: Symptoms/Limitations Symptoms: Pt states she continues to have swelling in her Rt knee.   [name] Assessment Currently in [name]?: Yes [name] Score: 4  [name] Location: Knee [name] Orientation: Right    Exercise/Treatments   Aerobic Stationary Bike: NuStep 10 minutes hill level 3 resistance level 3 SPM >100   Standing Knee Flexion: Both;15 reps;Limitations Knee Flexion Limitations: 4# Lateral Step Up: 15 reps;Hand Hold: 1;Step Height: 4";Both Forward Step Up: Both;15 reps;Hand Hold: 1;Step Height: 4" Functional Squat: 15 reps Rocker Board: 2 minutes SLS: 3 x 15" Other Standing Knee Exercises: tandem and retro gt x 2 RT Other Standing Knee Exercises: 5 STS without HHA    Physical Therapy Assessment and Plan PT Assessment and Plan Clinical Impression Statement: Pt given order for compression host.  PT appears to be improving in balance.  Will reassess next week.  Added tandema and retro gt for improved balance PT Frequency: Min 3X/week PT Plan: complete LEFS for new g-code next visit. Next week reassess    Goals  progressing  Problem List Patient Active Problem List   Diagnosis Date Noted  . Difficulty in walking(719.7) 06/16/2013  . Bilateral leg weakness 06/16/2013  . Dehydration 04/21/2013  . CAP (community acquired pneumonia) 04/20/2013  . UTI (lower urinary tract infection) 10/27/2012  . Closed trimalleolar fracture of right ankle 10/25/2012  . Frequent falls 10/25/2012  . Incontinence   . Osteoarthritis   . Hyperlipidemia   . Chronic bronchitis   . GERD (gastroesophageal reflux disease)   . Neuropathy   . Hypertension   . Balance disorder   .  Wears glasses   . Hearing loss   . Iatrogenic adrenal insufficiency   . Tubulovillous adenoma of rectum 09/04/2011     GP Functional Assessment Tool Used: LEFS   Kentrell Hallahan,CINDY 07/12/2013, 5:16 PM

## 2013-07-14 ENCOUNTER — Ambulatory Visit (HOSPITAL_COMMUNITY)
Admission: RE | Admit: 2013-07-14 | Discharge: 2013-07-14 | Disposition: A | Discharge status: Home / Self Care | Payer: Medicare Other | Source: Ambulatory Visit | Attending: Internal Medicine | Admitting: Internal Medicine

## 2013-07-14 DIAGNOSIS — R262 Difficulty in walking, not elsewhere classified: Secondary | ICD-10-CM

## 2013-07-14 DIAGNOSIS — R29898 Other symptoms and signs involving the musculoskeletal system: Secondary | ICD-10-CM

## 2013-07-14 NOTE — Progress Notes (Addendum)
Physical Therapy Treatment Patient Details  Name: BRYNNAN RODENBAUGH MRN: [id number] Date of Birth: 04/06/1936  Today's Date: 07/14/2013 Time: 1105-1150 PT Time Calculation (min): 45 min  Visit#: 10 of 12  Re-eval: 07/16/13    Authorization: medicare  Authorization Time Period:    Authorization Visit#: 10 of 12  Charge there ex 1110-1140; neuromuscular re-ed 1140-1150 Subjective: Symptoms/Limitations Symptoms: Pt states no swelling; doing her exercises at home Pain Assessment Pain Score: 3  Pain Location: Knee Pain Orientation: Right  Exercise/Treatments   Aerobic Stationary Bike: NuStep 10 minutes hill level 3 resistance level 3 SPM >100   Standing Heel Raises: 10 reps Lateral Step Up: 15 reps;Hand Hold: 1;Step Height: 4";Both Forward Step Up: Both;15 reps;Hand Hold: 1;Step Height: 4" Functional Squat: 15 reps Rocker Board: 2 minutes SLS: 3 x 15" Other Standing Knee Exercises: side step, tandum and retro gait x 2 RT Other Standing Knee Exercises: 5 SLS without HHA Seated Other Seated Knee Exercises: sit to stand x 5 B    Physical Therapy Assessment and Plan PT Assessment and Plan Clinical Impression Statement: Pt continues to improve in her stability;  Addised sist to stand and side stepping.  LEFS improved from 11 to 29 /80 PT Plan: begin t-band with side stepping.    Goals   progressing Problem List Patient Active Problem List   Diagnosis Date Noted  . Difficulty in walking(719.7) 06/16/2013  . Bilateral leg weakness 06/16/2013  . Dehydration 04/21/2013  . CAP (community acquired pneumonia) 04/20/2013  . UTI (lower urinary tract infection) 10/27/2012  . Closed trimalleolar fracture of right ankle 10/25/2012  . Frequent falls 10/25/2012  . Incontinence   . Osteoarthritis   . Hyperlipidemia   . Chronic bronchitis   . GERD (gastroesophageal reflux disease)   . Neuropathy   . Hypertension   . Balance disorder   . Wears glasses   . Hearing loss   .  Iatrogenic adrenal insufficiency   . Tubulovillous adenoma of rectum 09/04/2011    General Behavior During Therapy: [facility] for tasks assessed/performed  GP Functional Assessment Tool Used: LEFS Functional Limitation: Self care Mobility: Walking and Moving Around Current Status (O1308): At least 60 percent but less than 80 percent impaired, limited or restricted Mobility: Walking and Moving Around Goal Status [phone]): At least 40 percent but less than 60 percent impaired, limited or restricted  Delynda Sepulveda,CINDY 07/14/2013, 12:03 PM

## 2013-07-17 ENCOUNTER — Ambulatory Visit (HOSPITAL_COMMUNITY)
Admission: RE | Admit: 2013-07-17 | Discharge: 2013-07-17 | Disposition: A | Discharge status: Home / Self Care | Payer: Medicare Other | Source: Ambulatory Visit | Attending: Internal Medicine | Admitting: Internal Medicine

## 2013-07-17 NOTE — Evaluation (Signed)
Physical Therapy Re-evaluation  Patient Details  Name: Marie [name] MRN: [id number] Date of Birth: 03-06-1936  Today's Date: 07/17/2013 Time: 7829-5621 PT Time Calculation (min): 42 min Charges: PPT x 15'(1515-1528) MMT x 3(0865-7846) Self care x 15'(1540-1555)              Visit#: 11 of 24  Re-eval: 07/16/13 Assessment Diagnosis: Rt knee OA/ LOB Next MD Visit: Carola Frost MD unscheduled  Authorization: medicare    Authorization Visit#: 11 of 24   Past Medical History:  Past Medical History  Diagnosis Date  . Incontinence   . Osteoarthritis     hands,   . Wears dentures   . Osteoporosis   . Bulging disc   . Hyperlipidemia   . Chronic bronchitis     due to congestion at times, on prednisone and advair  . GERD (gastroesophageal reflux disease)   . Neuropathy     legs stay numb   . Depression     many years ago  . Hypertension     followed by intern med Dr. Kendrick Fries [phone]  . Tubulovillous adenoma of rectum   . Urinary incontinence   . Balance disorder 2008.      Falls a lot.  She can be standing and then leans too far over to one side   . Wears glasses   . Hearing loss     mild  . Iatrogenic adrenal insufficiency    Past Surgical History:  Past Surgical History  Procedure Laterality Date  . Incontinence surgery      multiple procedures, not cured  . Rectal surgery      by dr. Carolynne Edouard, removal of polyp  . Appendectomy    . Abdominal hysterectomy    . Tonsillectomy    . Eye surgery      bilateral cataract surgery and lens implant  . Dilation and curettage of uterus    . Rectal biopsy  09/21/2011    Procedure: BIOPSY RECTAL;  Surgeon: Robyne Askew, MD;  Location: [facility] OR;  Service: General;  Laterality: N/A;  3-4 cm  . Finger surgery      fusions and debridements for OA  . External fixation leg  10/25/2012    Procedure: EXTERNAL FIXATION LEG;  Surgeon: Budd Palmer, MD;  Location: [facility] OR;  Service: Orthopedics;  Laterality: Right;     Subjective Symptoms/Limitations Symptoms: Pt states that she only has pain when she is walking. Pain Assessment Currently in Pain?: Yes Pain Score: 5  (when walking; 0/10 at rest) Pain Location: Knee Pain Orientation: Right  Precautions/Restrictions  Precautions Precautions: None  Assessment RLE Strength Right Hip Flexion:  (4+/5 was 3/5 on 06/16/13) Right Hip Extension: 3/5 ( was 2+/5 on 06/16/13) Right Hip ABduction: 3+/5 ( was 2+/5 on 06/16/13) Right Knee Flexion: 5/5 ( was 5/5 on 06/16/13) Right Knee Extension: 5/5 ( was 4-/5 on 06/16/13) Right Ankle Dorsiflexion: 5/5 ( was 4/5 on 06/16/13) LLE Strength Left Hip Flexion:  (4+/5 ) Left Hip Extension: 3/5 ( was 2+/5 on 06/16/13) Left Hip ABduction: 4/5 ( was 3-/5 on 06/16/13) Left Knee Flexion: 5/5 ( was 5/5 on 06/16/13) Left Knee Extension: 5/5 ( was 5/5 on 06/16/13) Left Ankle Dorsiflexion: 5/5 ( was 5/5 on 06/16/13)  Exercise/Treatments Mobility/Balance  Berg Balance Test Sit to Stand: Able to stand  independently using hands Standing Unsupported: Able to stand safely 2 minutes Sitting with Back Unsupported but Feet Supported on Floor or Stool: Able to sit safely and securely  2 minutes Stand to Sit: Sits safely with minimal use of hands Transfers: Able to transfer safely, minor use of hands Standing Unsupported with Eyes Closed: Able to stand 10 seconds safely Standing Ubsupported with Feet Together: Able to place feet together independently and stand for 1 minute with supervision From Standing, Reach Forward with Outstretched Arm: Can reach confidently >25 cm (10") From Standing Position, Pick up Object from Floor: Able to pick up shoe, needs supervision From Standing Position, Turn to Look Behind Over each Shoulder: Looks behind one side only/other side shows less weight shift Turn 360 Degrees: Able to turn 360 degrees safely one side only in 4 seconds or less Standing Unsupported, Alternately Place Feet on Step/Stool:  Able to complete >2 steps/needs minimal assist Standing Unsupported, One Foot in Front: Able to plae foot ahead of the other independently and hold 30 seconds Standing on One Leg: Able to lift leg independently and hold equal to or more than 3 seconds Total Score: 45 Timed Up and Go Test TUG: Normal TUG Normal TUG (seconds): 18.8 (was 32)   Physical Therapy Assessment and Plan PT Assessment and Plan Clinical Impression Statement: Pt has progressed well with therapy. Both TUG and BERG have significantly improved. Pt continues to be limited by knee pain from osteoarthritis. Though pt has made significant gains pt would benefit from continuing skilled PT to improved remaining deficits(LE weakness and decreased balance). PT Plan: Recommend to continue skilled PT 3x per week x 4 more weeks.    Goals Home Exercise Program Pt/caregiver will Perform Home Exercise Program: For increased strengthening PT Short Term Goals Time to Complete Short Term Goals: 2 weeks PT Short Term Goal 1: Pt to be able to come sit to stand in one attempt 90% of the time when using hands. PT Short Term Goal 1 - Progress: Met PT Short Term Goal 2: Pt to be able to stand for 10 minues to be able to make a sandwich PT Short Term Goal 2 - Progress: Met PT Short Term Goal 3: Pt to be using a walker at all times to walk with PT Short Term Goal 3 - Progress: Met PT Long Term Goals Time to Complete Long Term Goals: 4 weeks PT Long Term Goal 1: Pt I in advance HEP PT Long Term Goal 1 - Progress: Met PT Long Term Goal 2: Pt Berg score to be improved by 10 pts to allow pt to be able to walk with a cane safely PT Long Term Goal 2 - Progress: Partly met Long Term Goal 3: Pt to be able to stand for 20 minutes to make a small meal Long Term Goal 3 Progress: Progressing toward goal Long Term Goal 4: Pt to report that she has not fallen in two weeks Long Term Goal 4 Progress: Progressing toward goal PT Long Term Goal 5: Pt to  state the pain in her Rt knee is  no greater that a 2/10 80% of the day.  Long Term Goal 5 Progress: Progressing toward goal Additional PT Long Term Goals?: Yes PT Long Term Goal 6: Pt TUG to be improved by 10 seconds to decrease pt risk of falling Long Term Goal 6 Progress: Met . New goal Tug to be with a cane at 15 seconds or less Problem List Patient Active Problem List   Diagnosis Date Noted  . Difficulty in walking(719.7) 06/16/2013  . Bilateral leg weakness 06/16/2013  . Dehydration 04/21/2013  . CAP (community acquired pneumonia) 04/20/2013  .  UTI (lower urinary tract infection) 10/27/2012  . Closed trimalleolar fracture of right ankle 10/25/2012  . Frequent falls 10/25/2012  . Incontinence   . Osteoarthritis   . Hyperlipidemia   . Chronic bronchitis   . GERD (gastroesophageal reflux disease)   . Neuropathy   . Hypertension   . Balance disorder   . Wears glasses   . Hearing loss   . Iatrogenic adrenal insufficiency   . Tubulovillous adenoma of rectum 09/04/2011    PT - End of Session Equipment Utilized During Treatment: Gait belt Activity Tolerance: Patient tolerated treatment well General Behavior During Therapy: WFL for tasks assessed/performed PT Plan of Care PT Home Exercise Plan: Given to increase emphasis on hip strengthening.  GP Functional Assessment Tool Used: LEFS  Seth Bake, PTA  07/17/2013, 4:02 PM  Physician Documentation Your signature is required to indicate approval of the treatment plan as stated above.  Please sign and either send electronically or make a copy of this report for your files and return this physician signed original.   Please mark one 1.__approve of plan  2. ___approve of plan with the following conditions.   ______________________________                                                          _____________________ Physician Signature                                                                                                              Date

## 2013-07-19 ENCOUNTER — Telehealth (HOSPITAL_COMMUNITY): Payer: Self-pay

## 2013-07-19 ENCOUNTER — Ambulatory Visit (HOSPITAL_COMMUNITY): Payer: Medicare Other | Admitting: *Deleted

## 2013-07-24 ENCOUNTER — Ambulatory Visit (HOSPITAL_COMMUNITY)
Admission: RE | Admit: 2013-07-24 | Discharge: 2013-07-24 | Disposition: A | Discharge status: Home / Self Care | Payer: Medicare Other | Source: Ambulatory Visit | Attending: Internal Medicine | Admitting: Internal Medicine

## 2013-07-24 DIAGNOSIS — I10 Essential (primary) hypertension: Secondary | ICD-10-CM | POA: Diagnosis not present

## 2013-07-24 DIAGNOSIS — M25569 Pain in unspecified knee: Secondary | ICD-10-CM | POA: Insufficient documentation

## 2013-07-24 DIAGNOSIS — Z9181 History of falling: Secondary | ICD-10-CM | POA: Insufficient documentation

## 2013-07-24 DIAGNOSIS — R269 Unspecified abnormalities of gait and mobility: Secondary | ICD-10-CM | POA: Insufficient documentation

## 2013-07-24 DIAGNOSIS — IMO0001 Reserved for inherently not codable concepts without codable children: Secondary | ICD-10-CM | POA: Diagnosis not present

## 2013-07-24 NOTE — Progress Notes (Addendum)
Physical Therapy Treatment Patient Details  Name: Marie [name] MRN: [id number] Date of Birth: 1936-04-18  Today's Date: 07/24/2013 Time: 4696-2952 PT Time Calculation (min): 41 min Charges: Therex x [phone]) NMR x 15'(1330-1345)  Visit#: 12 of 24  Re-eval: 07/16/13  Authorization: medicare  Authorization Visit#: 12 of 24   Subjective: Symptoms/Limitations Symptoms: Pt states that her knee knee seems to be getting better. Pain Assessment Currently in Pain?: No/denies  Exercise/Treatments Supine Bridges: 10 reps;Limitations Bridges Limitations: 5" holds Sidelying Hip ABduction: 10 reps Prone  Hip Extension: 10 reps  Balance Exercises Standing Standing Eyes Opened: Narrow base of support (BOS);5 reps;Head turns Standing Eyes Opened Limitations: NBOS finding center BOS Standing Eyes Closed: Narrow base of support (BOS);1 rep;30 secs Tandem Stance: Eyes open;Intermittent HHA;Limitations Tandem Stance Limitations: 1x1 bilateral true tandem Wall Bumps: Hips;Shoulder;10 reps Other Standing Exercises: Standing with 1 foot on 6" step without UE assist 1x1' bilateral   Physical Therapy Assessment and Plan PT Assessment and Plan Clinical Impression Statement: Treatment focus on improve hip strength and balance. Pt displays improve proprioceptive control with standing with narrow BOS. Pt tolerates progression of balance activities well. Pt is without complaint throughout session. PT Plan: Continue to progress strength and blance per PT POC.    Problem List Patient Active Problem List   Diagnosis Date Noted  . Difficulty in walking(719.7) 06/16/2013  . Bilateral leg weakness 06/16/2013  . Dehydration 04/21/2013  . CAP (community acquired pneumonia) 04/20/2013  . UTI (lower urinary tract infection) 10/27/2012  . Closed trimalleolar fracture of right ankle 10/25/2012  . Frequent falls 10/25/2012  . Incontinence   . Osteoarthritis   . Hyperlipidemia   . Chronic  bronchitis   . GERD (gastroesophageal reflux disease)   . Neuropathy   . Hypertension   . Balance disorder   . Wears glasses   . Hearing loss   . Iatrogenic adrenal insufficiency   . Tubulovillous adenoma of rectum 09/04/2011    PT - End of Session Equipment Utilized During Treatment: Gait belt Activity Tolerance: Patient tolerated treatment well General Behavior During Therapy: [facility] for tasks assessed/performed  Seth Bake, PTA  07/24/2013, 3:47 PM

## 2013-07-26 ENCOUNTER — Encounter (INDEPENDENT_AMBULATORY_CARE_PROVIDER_SITE_OTHER): Payer: Self-pay

## 2013-07-26 ENCOUNTER — Ambulatory Visit (HOSPITAL_COMMUNITY)
Admission: RE | Admit: 2013-07-26 | Discharge: 2013-07-26 | Disposition: A | Discharge status: Home / Self Care | Payer: Medicare Other | Source: Ambulatory Visit | Attending: Internal Medicine | Admitting: Internal Medicine

## 2013-07-26 DIAGNOSIS — IMO0001 Reserved for inherently not codable concepts without codable children: Secondary | ICD-10-CM | POA: Diagnosis not present

## 2013-07-26 DIAGNOSIS — I10 Essential (primary) hypertension: Secondary | ICD-10-CM | POA: Diagnosis not present

## 2013-07-26 DIAGNOSIS — M25569 Pain in unspecified knee: Secondary | ICD-10-CM | POA: Diagnosis not present

## 2013-07-26 DIAGNOSIS — R269 Unspecified abnormalities of gait and mobility: Secondary | ICD-10-CM | POA: Diagnosis not present

## 2013-07-26 DIAGNOSIS — Z9181 History of falling: Secondary | ICD-10-CM | POA: Diagnosis not present

## 2013-07-26 NOTE — Progress Notes (Signed)
Physical Therapy Treatment Patient Details  Name: Marie [name] MRN: [id number] Date of Birth: 18-Jun-1936  Today's Date: 07/26/2013 Time: 0865-7846 PT Time Calculation (min): 27 min  Visit#: 13 of 24  Re-eval: 07/16/13 Authorization: medicare  Authorization Visit#: 13 of 24  Charges:  therex 12' 1435-1447, NMR 12' 1449-1501  Subjective: Symptoms/Limitations Symptoms: "I feel horrible today"  Pt reports she has no energy and really doesnt feel like completing therapy. Pain Assessment Currently in Pain?: No/denies   Exercise/Treatments Aerobic Stationary Bike: NuStep 8 minutes hill level 3 resistance level 3 SPM >100 Balance Tandem gait 1RT Retro gait 1RT Heelraises 15 reps     Physical Therapy Assessment and Plan PT Assessment and Plan Clinical Impression Statement: Pt unable to complete session today due to extreme fatigue.  Pt reports she will attempt her HEP later today if she begins to feel better. PT Plan: Continue to progress strength and blance per PT POC.     Problem List Patient Active Problem List   Diagnosis Date Noted  . Difficulty in walking(719.7) 06/16/2013  . Bilateral leg weakness 06/16/2013  . Dehydration 04/21/2013  . CAP (community acquired pneumonia) 04/20/2013  . UTI (lower urinary tract infection) 10/27/2012  . Closed trimalleolar fracture of right ankle 10/25/2012  . Frequent falls 10/25/2012  . Incontinence   . Osteoarthritis   . Hyperlipidemia   . Chronic bronchitis   . GERD (gastroesophageal reflux disease)   . Neuropathy   . Hypertension   . Balance disorder   . Wears glasses   . Hearing loss   . Iatrogenic adrenal insufficiency   . Tubulovillous adenoma of rectum 09/04/2011    PT - End of Session Equipment Utilized During Treatment: Gait belt Activity Tolerance: Patient tolerated treatment well General Behavior During Therapy: [facility] for tasks assessed/performed   Lurena Nida, PTA/CLT 07/26/2013, 3:11 PM

## 2013-07-28 ENCOUNTER — Telehealth (HOSPITAL_COMMUNITY): Payer: Self-pay

## 2013-07-28 ENCOUNTER — Ambulatory Visit (HOSPITAL_COMMUNITY): Payer: Medicare Other | Admitting: Physical Therapy

## 2013-07-31 ENCOUNTER — Ambulatory Visit (HOSPITAL_COMMUNITY)
Admission: RE | Admit: 2013-07-31 | Discharge: 2013-07-31 | Disposition: A | Discharge status: Home / Self Care | Payer: Medicare Other | Source: Ambulatory Visit | Attending: Internal Medicine | Admitting: Internal Medicine

## 2013-07-31 DIAGNOSIS — R269 Unspecified abnormalities of gait and mobility: Secondary | ICD-10-CM | POA: Diagnosis not present

## 2013-07-31 DIAGNOSIS — I10 Essential (primary) hypertension: Secondary | ICD-10-CM | POA: Diagnosis not present

## 2013-07-31 DIAGNOSIS — M25569 Pain in unspecified knee: Secondary | ICD-10-CM | POA: Diagnosis not present

## 2013-07-31 DIAGNOSIS — Z9181 History of falling: Secondary | ICD-10-CM | POA: Diagnosis not present

## 2013-07-31 DIAGNOSIS — IMO0001 Reserved for inherently not codable concepts without codable children: Secondary | ICD-10-CM | POA: Diagnosis not present

## 2013-07-31 NOTE — Progress Notes (Signed)
Physical Therapy Treatment Patient Details  Name: Marie [name] MRN: [id number] Date of Birth: 06/20/1936  Today's Date: 07/31/2013 Time: 4098-1191 PT Time Calculation (min): 47 min  Visit#: 14 of 24  Re-eval: 07/16/13 Authorization: medicare  Authorization Visit#: 14 of 24  Charges: NMR 45'  Subjective: Symptoms/Limitations Symptoms: Pt states she feels much better today than she did last week.    States she doesn't have her full energy back but better.   Pain Assessment Currently in Pain?: No/denies   Exercise/Treatments Balance Exercises Standing SLS: Eyes open;Solid surface;5 reps;Time SLS Time: Rt: 5" Lt: 3" Balance Beam: fwd 1RT, lateral 1RT Tandem Gait: 2 reps Retro Gait: 2 reps Heel Raises: Limitations Heel Raises Limitations: heelwalking 1RT Toe Raise: Limitations Toe Raise Limitations: Toewalking 1RT Other Standing Exercises: lateral step up 10 reps each Other Standing Exercises: Hip hike Lt only 10 reps  Seated Other Seated Exercises: NuStep 10' seat 8 level 3 hills #3   Physical Therapy Assessment and Plan PT Assessment and Plan Clinical Impression Statement: Focused session today on improving balance.  Added new balance activities with balance beam and heel/toe walking being the most difficult to complete.  Tendency for extension posture on balance beam while walking laterally, requiring min-mod assist to correct.  Pt able to complete full session today with only 3 seated rest breaks. PT Plan: Continue to progress strength and blance per PT POC.     Problem List Patient Active Problem List   Diagnosis Date Noted  . Difficulty in walking(719.7) 06/16/2013  . Bilateral leg weakness 06/16/2013  . Dehydration 04/21/2013  . CAP (community acquired pneumonia) 04/20/2013  . UTI (lower urinary tract infection) 10/27/2012  . Closed trimalleolar fracture of right ankle 10/25/2012  . Frequent falls 10/25/2012  . Incontinence   . Osteoarthritis   .  Hyperlipidemia   . Chronic bronchitis   . GERD (gastroesophageal reflux disease)   . Neuropathy   . Hypertension   . Balance disorder   . Wears glasses   . Hearing loss   . Iatrogenic adrenal insufficiency   . Tubulovillous adenoma of rectum 09/04/2011    PT - End of Session Equipment Utilized During Treatment: Gait belt Activity Tolerance: Patient tolerated treatment well General Behavior During Therapy: [facility] for tasks assessed/performed   Lurena Nida, PTA/CLT 07/31/2013, 12:24 PM

## 2013-08-02 ENCOUNTER — Ambulatory Visit (HOSPITAL_COMMUNITY)
Admission: RE | Admit: 2013-08-02 | Discharge: 2013-08-02 | Disposition: A | Discharge status: Home / Self Care | Payer: Medicare Other | Source: Ambulatory Visit | Attending: Internal Medicine | Admitting: Internal Medicine

## 2013-08-02 DIAGNOSIS — I10 Essential (primary) hypertension: Secondary | ICD-10-CM | POA: Diagnosis not present

## 2013-08-02 DIAGNOSIS — IMO0001 Reserved for inherently not codable concepts without codable children: Secondary | ICD-10-CM | POA: Diagnosis not present

## 2013-08-02 DIAGNOSIS — M25569 Pain in unspecified knee: Secondary | ICD-10-CM | POA: Diagnosis not present

## 2013-08-02 DIAGNOSIS — Z9181 History of falling: Secondary | ICD-10-CM | POA: Diagnosis not present

## 2013-08-02 DIAGNOSIS — R269 Unspecified abnormalities of gait and mobility: Secondary | ICD-10-CM | POA: Diagnosis not present

## 2013-08-02 NOTE — Progress Notes (Signed)
Physical Therapy Treatment Patient Details  Name: Marie [name] MRN: [id number] Date of Birth: 12/30/1935  Today's Date: 08/02/2013 Time: 4098-1191 PT Time Calculation (min): 43 min Charges: NMR x 47'(8295-6213) Therex x 8'(1550-1558)  Visit#: 15 of 24  Re-eval: 07/16/13  Authorization: medicare  Authorization Visit#: 15 of 24   Subjective: Symptoms/Limitations Symptoms: Pt states she feels like she has more energy and that her balance/confidence appears to be improving.  Pain Assessment Currently in Pain?: No/denies   Exercise/Treatments Balance Exercises Standing Standing Eyes Opened: Narrow base of support (BOS);5 reps;Head turns Standing Eyes Closed: Narrow base of support (BOS);1 rep;30 secs Tandem Stance: Eyes open;Intermittent HHA;Limitations Tandem Stance Limitations: 1x1' bilateral Wall Bumps: Hips;Shoulder;10 reps Heel Raises: 15 reps;Limitations Heel Raises Limitations: no UE assistance Toe Raise: 15 reps Toe Raise Limitations: with HHA Other Standing Exercises: Standing with one foot on 6" step without HHA 1x1' bilateral Other Standing Exercises: heel walk 1 RTe walk x 1 RT  Sidelying Hip ABduction: 10 reps;Limitations (only 5 reps completed on RLE secondary to left hip pain)  Physical Therapy Assessment and Plan PT Assessment and Plan Clinical Impression Statement: Pt displays improved stability with tandem stance and narrow BOS. Pt tends to stand with COG behind BOS relying on Y ligaments. Pt requires multimodal cuing to improve posture throughout session. Over all pt's balance and confidence appear to be improving. PT Plan: Continue to progress strength and blance per PT POC.    Problem List Patient Active Problem List   Diagnosis Date Noted  . Difficulty in walking(719.7) 06/16/2013  . Bilateral leg weakness 06/16/2013  . Dehydration 04/21/2013  . CAP (community acquired pneumonia) 04/20/2013  . UTI (lower urinary tract infection) 10/27/2012  .  Closed trimalleolar fracture of right ankle 10/25/2012  . Frequent falls 10/25/2012  . Incontinence   . Osteoarthritis   . Hyperlipidemia   . Chronic bronchitis   . GERD (gastroesophageal reflux disease)   . Neuropathy   . Hypertension   . Balance disorder   . Wears glasses   . Hearing loss   . Iatrogenic adrenal insufficiency   . Tubulovillous adenoma of rectum 09/04/2011    PT - End of Session Equipment Utilized During Treatment: Gait belt Activity Tolerance: Patient tolerated treatment well General Behavior During Therapy: [facility] for tasks assessed/performed  Seth Bake, PTA 08/02/2013, 4:20 PM

## 2013-08-04 ENCOUNTER — Inpatient Hospital Stay (HOSPITAL_COMMUNITY): Admission: RE | Admit: 2013-08-04 | Payer: Medicare Other | Source: Ambulatory Visit | Admitting: Physical Therapy

## 2013-08-07 ENCOUNTER — Telehealth (HOSPITAL_COMMUNITY): Payer: Self-pay

## 2013-08-07 ENCOUNTER — Ambulatory Visit (HOSPITAL_COMMUNITY): Payer: Medicare Other | Admitting: Physical Therapy

## 2013-08-07 DIAGNOSIS — J309 Allergic rhinitis, unspecified: Secondary | ICD-10-CM | POA: Diagnosis not present

## 2013-08-07 DIAGNOSIS — J40 Bronchitis, not specified as acute or chronic: Secondary | ICD-10-CM | POA: Diagnosis not present

## 2013-08-07 DIAGNOSIS — J019 Acute sinusitis, unspecified: Secondary | ICD-10-CM | POA: Diagnosis not present

## 2013-08-09 ENCOUNTER — Ambulatory Visit (HOSPITAL_COMMUNITY): Payer: Medicare Other

## 2013-08-11 ENCOUNTER — Ambulatory Visit (HOSPITAL_COMMUNITY): Payer: Medicare Other | Admitting: Physical Therapy

## 2013-08-17 DIAGNOSIS — Z79899 Other long term (current) drug therapy: Secondary | ICD-10-CM | POA: Diagnosis not present

## 2013-08-17 DIAGNOSIS — E782 Mixed hyperlipidemia: Secondary | ICD-10-CM | POA: Diagnosis not present

## 2013-08-17 DIAGNOSIS — Z0000 Encounter for general adult medical examination without abnormal findings: Secondary | ICD-10-CM | POA: Diagnosis not present

## 2013-08-17 DIAGNOSIS — R7309 Other abnormal glucose: Secondary | ICD-10-CM | POA: Diagnosis not present

## 2013-08-17 DIAGNOSIS — Z23 Encounter for immunization: Secondary | ICD-10-CM | POA: Diagnosis not present

## 2013-08-17 DIAGNOSIS — Z1212 Encounter for screening for malignant neoplasm of rectum: Secondary | ICD-10-CM | POA: Diagnosis not present

## 2013-08-17 DIAGNOSIS — I10 Essential (primary) hypertension: Secondary | ICD-10-CM | POA: Diagnosis not present

## 2013-08-17 DIAGNOSIS — E559 Vitamin D deficiency, unspecified: Secondary | ICD-10-CM | POA: Diagnosis not present

## 2013-08-17 DIAGNOSIS — N3000 Acute cystitis without hematuria: Secondary | ICD-10-CM | POA: Diagnosis not present

## 2013-09-01 ENCOUNTER — Ambulatory Visit (INDEPENDENT_AMBULATORY_CARE_PROVIDER_SITE_OTHER): Payer: Medicare Other | Admitting: General Surgery

## 2013-09-01 ENCOUNTER — Encounter (INDEPENDENT_AMBULATORY_CARE_PROVIDER_SITE_OTHER): Payer: Self-pay | Admitting: General Surgery

## 2013-09-01 ENCOUNTER — Encounter (INDEPENDENT_AMBULATORY_CARE_PROVIDER_SITE_OTHER): Payer: Self-pay

## 2013-09-01 VITALS — BP 118/68 | HR 84 | Temp 98.3°F | Resp 14 | Ht 64.0 in | Wt 163.8 lb

## 2013-09-01 DIAGNOSIS — D128 Benign neoplasm of rectum: Secondary | ICD-10-CM

## 2013-09-01 NOTE — Progress Notes (Signed)
Subjective:     Patient ID: Marie [name], female   DOB: 26-Jan-1936, 77 y.o.   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 The adenoma was oriented from left to right near the dentate line rather than superficial to deep which made the resection very difficult. She states that she has noticed some blood with her bowel movements about 4 times in the last month. She does not have regular bowel movements and only moves her bowels about twice a week. She denies constipation. She denies any rectal pain.  Review of Systems  Constitutional: Negative.   HENT: Negative.   Eyes: Negative.   Respiratory: Negative.   Cardiovascular: Negative.   Gastrointestinal: Positive for blood in stool.  Endocrine: Negative.   Genitourinary: Negative.   Musculoskeletal: Negative.   Skin: Negative.   Allergic/Immunologic: Negative.   Neurological: Negative.   Hematological: Negative.   Psychiatric/Behavioral: Negative.        Objective:   Physical Exam  Constitutional: She is oriented to person, place, and time. She appears well-developed and well-nourished.  HENT:  Head: Normocephalic and atraumatic.  Eyes: Conjunctivae and EOM are normal. Pupils are equal, round, and reactive to light.  Neck: Normal range of motion. Neck supple.  Cardiovascular: Normal rate, regular rhythm and normal heart sounds.   Pulmonary/Chest: Effort normal and breath sounds normal.  Abdominal: Soft. Bowel sounds are normal. She exhibits no mass. There is no tenderness.  Genitourinary:  Rectal exam her perirectal skin looks good with no lesions. On digital exam she has poor rectal tone. There is no palpable mass. On anoscopic exam I do not see obvious recurrence of the adenoma but she does have one area of friable mucosa in the left lateral position. This area bled easily and the bleeding was controlled with silver nitrate.  She tolerated this well.  Musculoskeletal: Normal range of motion.  Neurological: She is alert and oriented to person, place, and time.  Skin: Skin is warm and dry.  Psychiatric: She has a normal mood and affect. Her behavior is normal.       Assessment:     The patient is 2 years status post resection of a tubulovillous adenoma from her rectum near the dentate line     Plan:     Because of her current finding with the friable mucosa as well as her poor rectal tone I think she needs to be evaluated by a colorectal specialist. We will make a referral and plan to see her back as needed.

## 2013-09-01 NOTE — Patient Instructions (Signed)
Will refer to a colorectal specialist

## 2013-09-04 ENCOUNTER — Telehealth (INDEPENDENT_AMBULATORY_CARE_PROVIDER_SITE_OTHER): Payer: Self-pay | Admitting: *Deleted

## 2013-09-04 NOTE — Telephone Encounter (Signed)
I called pt to inform her of her appt with Dr. Toni Arthurs at [facility] on 09/08/13 with an arrival time of 1:45pm.  I provided pt with their address and phone number.  Pt agreeable with this appt.  [facility] [phone]

## 2013-09-05 DIAGNOSIS — L97509 Non-pressure chronic ulcer of other part of unspecified foot with unspecified severity: Secondary | ICD-10-CM | POA: Diagnosis not present

## 2013-09-05 LAB — FECAL OCCULT BLOOD, GUAIAC: Fecal Occult Blood: NEGATIVE

## 2013-09-07 ENCOUNTER — Ambulatory Visit: Payer: Medicare Other | Admitting: Emergency Medicine

## 2013-09-07 ENCOUNTER — Encounter: Payer: Self-pay | Admitting: Emergency Medicine

## 2013-09-07 VITALS — BP 112/58 | HR 80 | Temp 98.0°F | Resp 18 | Wt 167.0 lb

## 2013-09-07 DIAGNOSIS — J329 Chronic sinusitis, unspecified: Secondary | ICD-10-CM

## 2013-09-07 DIAGNOSIS — J309 Allergic rhinitis, unspecified: Secondary | ICD-10-CM

## 2013-09-07 DIAGNOSIS — J440 Chronic obstructive pulmonary disease with acute lower respiratory infection: Secondary | ICD-10-CM

## 2013-09-07 MED ORDER — BENZONATATE 100 MG PO CAPS: 100.0000 mg | ORAL_CAPSULE | Freq: Three times a day (TID) | ORAL | Status: DC | PRN

## 2013-09-07 MED ORDER — POTASSIUM CHLORIDE ER 20 MEQ PO TBCR: 20.0000 meq | EXTENDED_RELEASE_TABLET | Freq: Three times a day (TID) | ORAL | Status: DC

## 2013-09-07 MED ORDER — PREDNISONE 10 MG PO TABS: ORAL_TABLET | ORAL | Status: DC

## 2013-09-07 MED ORDER — AZITHROMYCIN 250 MG PO TABS: ORAL_TABLET | ORAL | Status: AC

## 2013-09-07 NOTE — Patient Instructions (Signed)
Bronchitis °Bronchitis is a problem of the air tubes leading to your lungs. This problem makes it hard for air to get in and out of the lungs. You may cough a lot because your air tubes are narrow. Going without care can cause lasting (chronic) bronchitis. °HOME CARE  °· Drink enough fluids to keep your pee (urine) clear or pale yellow. °· Use a cool mist humidifier. °· Quit smoking if you smoke. If you keep smoking, the bronchitis might not get better. °· Only take medicine as told by your doctor. °GET HELP RIGHT AWAY IF:  °· Coughing keeps you awake. °· You start to wheeze. °· You become more sick or weak. °· You have a hard time breathing or get short of breath. °· You cough up blood. °· Coughing lasts more than 2 weeks. °· You have a fever. °· Your baby is older than 3 months with a rectal temperature of 102° F (38.9° C) or higher. °· Your baby is 3 months old or younger with a rectal temperature of 100.4° F (38° C) or higher. °MAKE SURE YOU: °· Understand these instructions. °· Will watch your condition. °· Will get help right away if you are not doing well or get worse. °Document Released: 03/23/2008 Document Revised: 12/28/2011 Document Reviewed: 05/30/2013 °[employer]® Patient Information ©2014 [employer]. ° °

## 2013-09-07 NOTE — Progress Notes (Signed)
Subjective:    Patient ID: Marie [name], female    DOB: Aug 07, 1936, 77 y.o.   MRN: [id number]  HPI Comments: 77 yo presents with cough x 3 days, now with increased hoarseness and nasal production occasionally green. No OTC tried.  Cough Associated symptoms include postnasal drip.    Current Outpatient Prescriptions on File Prior to Visit  Medication Sig Dispense Refill  . amitriptyline (ELAVIL) 10 MG tablet Take 10 mg by mouth 2 (two) times daily.       Marland Kitchen aspirin 81 MG tablet Take 81 mg by mouth daily.       . Cholecalciferol (VITAMIN D-3) 5000 UNITS TABS Take 1 tablet by mouth daily.       . Fluticasone-Salmeterol (ADVAIR DISKUS) 250-50 MCG/DOSE AEPB Inhale 1 puff into the lungs 2 (two) times daily.       Marland Kitchen losartan-hydrochlorothiazide (HYZAAR) 100-25 MG per tablet Take 1 tablet by mouth daily.       . Multiple Vitamin (MULTIVITAMIN) capsule Take 1 capsule by mouth daily.       Marland Kitchen omeprazole (PRILOSEC) 20 MG capsule Take 20 mg by mouth 2 (two) times daily.       Marland Kitchen oxybutynin (DITROPAN) 5 MG tablet Take 5 mg by mouth daily.       . pravastatin (PRAVACHOL) 40 MG tablet Take 40 mg by mouth daily.       . predniSONE (DELTASONE) 5 MG tablet Take 10 mg by mouth daily.      . traMADol (ULTRAM) 50 MG tablet Take 50 mg by mouth 2 (two) times daily as needed. For coughing      . acetaminophen (TYLENOL) 325 MG tablet Take 1-2 tablets (325-650 mg total) by mouth every 6 (six) hours as needed.  90 tablet  0  . bisacodyl (DULCOLAX) 10 MG suppository Place 1 suppository (10 mg total) rectally daily as needed.  12 suppository    . cholecalciferol (VITAMIN D) 1000 UNITS tablet Take 5 tablets (5,000 Units total) by mouth daily.      Marland Kitchen HYDROcodone-acetaminophen (NORCO/VICODIN) 5-325 MG per tablet Take 1-2 tablets by mouth every 6 (six) hours as needed for pain.  60 tablet  0  . metoprolol tartrate (LOPRESSOR) 12.5 mg TABS Take 0.5 tablets (12.5 mg total) by mouth 2 (two) times daily.  30 tablet  0   No  current facility-administered medications on file prior to visit.   ALLERGIES Codeine; Demerol; Morphine and related; Other; and Sulfate  Past Medical History  Diagnosis Date  . Incontinence   . Osteoarthritis     hands,   . Wears dentures   . Osteoporosis   . Bulging disc   . Hyperlipidemia   . Chronic bronchitis     due to congestion at times, on prednisone and advair  . GERD (gastroesophageal reflux disease)   . Neuropathy     legs stay numb   . Depression     many years ago  . Hypertension     followed by intern med Dr. Kendrick Fries [phone]  . Tubulovillous adenoma of rectum   . Urinary incontinence   . Balance disorder 2008.      Falls a lot.  She can be standing and then leans too far over to one side   . Wears glasses   . Hearing loss     mild  . Iatrogenic adrenal insufficiency     Review of Systems  HENT: Positive for congestion and postnasal drip.   Respiratory: Positive for  cough.   All other systems reviewed and are negative.    BP 112/58  Pulse 80  Temp(Src) 98 F (36.7 C) (Temporal)  Resp 18  Wt 167 lb (75.751 kg)     Objective:   Physical Exam  Nursing note and vitals reviewed. Constitutional: She is oriented to person, place, and time. She appears well-developed and well-nourished.  HENT:  Head: Normocephalic and atraumatic.  Right Ear: External ear normal.  Left Ear: External ear normal.  Nose: Nose normal.  Mouth/Throat: Oropharynx is clear and moist.  Cloudy TMs  Eyes: Conjunctivae are normal. Pupils are equal, round, and reactive to light.  Neck: Normal range of motion.  Cardiovascular: Normal rate, regular rhythm, normal heart sounds and intact distal pulses.   Pulmonary/Chest: Effort normal. She has wheezes.  Clears with cough  Musculoskeletal: Normal range of motion.  Lymphadenopathy:    She has no cervical adenopathy.  Neurological: She is alert and oriented to person, place, and time.  Skin: Skin is warm and dry.  Psychiatric:  She has a normal mood and affect. Judgment normal.          Assessment & Plan:  1. Sinus, bronchitis, allergic rhinitis- ZPAK if symptoms increase. Pred 10 mg DP and Tessalon perles, AD. Hold maintenance prednisone while on DP. Restart nebs AD, push fluids. Call if no change.

## 2013-09-08 DIAGNOSIS — K620 Anal polyp: Secondary | ICD-10-CM | POA: Diagnosis not present

## 2013-09-10 ENCOUNTER — Other Ambulatory Visit: Payer: Self-pay | Admitting: Internal Medicine

## 2013-09-11 ENCOUNTER — Other Ambulatory Visit: Payer: Self-pay | Admitting: Internal Medicine

## 2013-09-19 DIAGNOSIS — L97509 Non-pressure chronic ulcer of other part of unspecified foot with unspecified severity: Secondary | ICD-10-CM | POA: Diagnosis not present

## 2013-09-21 DIAGNOSIS — K620 Anal polyp: Secondary | ICD-10-CM | POA: Diagnosis not present

## 2013-09-25 ENCOUNTER — Encounter: Payer: Self-pay | Admitting: *Deleted

## 2013-09-25 ENCOUNTER — Ambulatory Visit (INDEPENDENT_AMBULATORY_CARE_PROVIDER_SITE_OTHER): Payer: Medicare Other | Admitting: *Deleted

## 2013-09-25 VITALS — BP 114/62 | HR 80 | Temp 98.0°F | Resp 18 | Ht 64.0 in | Wt 166.0 lb

## 2013-09-25 DIAGNOSIS — N3000 Acute cystitis without hematuria: Secondary | ICD-10-CM | POA: Diagnosis not present

## 2013-09-26 LAB — URINALYSIS, MICROSCOPIC ONLY
Casts: NONE SEEN
Crystals: NONE SEEN

## 2013-09-26 LAB — URINALYSIS, ROUTINE W REFLEX MICROSCOPIC
Ketones, ur: NEGATIVE mg/dL
Nitrite: NEGATIVE
Protein, ur: NEGATIVE mg/dL
Specific Gravity, Urine: 1.012 (ref 1.005–1.030)
Urobilinogen, UA: 0.2 mg/dL (ref 0.0–1.0)

## 2013-09-30 LAB — URINE CULTURE: Colony Count: >=100000

## 2013-10-02 ENCOUNTER — Other Ambulatory Visit: Payer: Self-pay | Admitting: Internal Medicine

## 2013-10-02 DIAGNOSIS — N3000 Acute cystitis without hematuria: Secondary | ICD-10-CM

## 2013-10-02 MED ORDER — CIPROFLOXACIN HCL 500 MG PO TABS: 500.0000 mg | ORAL_TABLET | Freq: Two times a day (BID) | ORAL | Status: AC

## 2013-10-16 ENCOUNTER — Encounter: Payer: Self-pay | Admitting: Physician Assistant

## 2013-10-16 ENCOUNTER — Ambulatory Visit (INDEPENDENT_AMBULATORY_CARE_PROVIDER_SITE_OTHER): Payer: Medicare Other | Admitting: Physician Assistant

## 2013-10-16 VITALS — BP 122/78 | HR 80 | Temp 98.1°F | Resp 16 | Ht 64.0 in | Wt 166.0 lb

## 2013-10-16 DIAGNOSIS — J209 Acute bronchitis, unspecified: Secondary | ICD-10-CM

## 2013-10-16 MED ORDER — AZITHROMYCIN 250 MG PO TABS: ORAL_TABLET | ORAL | Status: DC

## 2013-10-16 MED ORDER — PREDNISONE 20 MG PO TABS: ORAL_TABLET | ORAL | Status: DC

## 2013-10-16 NOTE — Progress Notes (Signed)
Subjective:    Patient ID: Marie [name], female    DOB: 1936-03-30, 77 y.o.   MRN: [id number]  Cough This is a new problem. The current episode started in the past 7 days. The problem has been gradually worsening. The problem occurs constantly. The cough is productive of purulent sputum. Associated symptoms include myalgias. Pertinent negatives include no chest pain, chills, ear congestion, ear pain, fever, headaches, heartburn, hemoptysis, nasal congestion, postnasal drip, rash, rhinorrhea, sore throat, shortness of breath, sweats, weight loss or wheezing. The symptoms are aggravated by lying down. She has tried nothing for the symptoms. Her past medical history is significant for bronchitis and COPD.   Current Outpatient Prescriptions on File Prior to Visit  Medication Sig Dispense Refill  . acetaminophen (TYLENOL) 325 MG tablet Take 1-2 tablets (325-650 mg total) by mouth every 6 (six) hours as needed.  90 tablet  0  . amitriptyline (ELAVIL) 10 MG tablet Take 10 mg by mouth 2 (two) times daily.       Marland Kitchen aspirin 81 MG tablet Take 81 mg by mouth daily.       . bisacodyl (DULCOLAX) 10 MG suppository Place 1 suppository (10 mg total) rectally daily as needed.  12 suppository    . cholecalciferol (VITAMIN D) 1000 UNITS tablet Take 5 tablets (5,000 Units total) by mouth daily.      . Cholecalciferol (VITAMIN D-3) 5000 UNITS TABS Take 1 tablet by mouth daily.       . Fluticasone-Salmeterol (ADVAIR DISKUS) 250-50 MCG/DOSE AEPB Inhale 1 puff into the lungs 2 (two) times daily.       Marland Kitchen HYDROcodone-acetaminophen (NORCO/VICODIN) 5-325 MG per tablet Take 1-2 tablets by mouth every 6 (six) hours as needed for pain.  60 tablet  0  . losartan-hydrochlorothiazide (HYZAAR) 100-25 MG per tablet Take 1 tablet by mouth daily.       . metoprolol tartrate (LOPRESSOR) 12.5 mg TABS Take 0.5 tablets (12.5 mg total) by mouth 2 (two) times daily.  30 tablet  0  . Multiple Vitamin (MULTIVITAMIN) capsule Take 1 capsule  by mouth daily.       Marland Kitchen omeprazole (PRILOSEC) 20 MG capsule Take 20 mg by mouth 2 (two) times daily.       Marland Kitchen omeprazole (PRILOSEC) 40 MG capsule TAKE ONE CAPSULE BY MOUTH TWICE DAILY FOR  ACID  REFLUX  60 capsule  3  . oxybutynin (DITROPAN) 5 MG tablet Take 5 mg by mouth daily.       . Potassium Chloride ER 20 MEQ TBCR Take 20 mEq by mouth 3 (three) times daily.  90 tablet  2  . pravastatin (PRAVACHOL) 40 MG tablet TAKE ONE TABLET BY MOUTH EVERY DAY FOR CHOLESTEROL  90 tablet  1  . predniSONE (DELTASONE) 10 MG tablet 1 po tid x 3 days, 1 po bid x 3days, 1 po qd x 5 days  20 tablet  0  . predniSONE (DELTASONE) 5 MG tablet Take 10 mg by mouth daily.      . traMADol (ULTRAM) 50 MG tablet Take 50 mg by mouth 2 (two) times daily as needed. For coughing       No current facility-administered medications on file prior to visit.   Past Medical History  Diagnosis Date  . Incontinence   . Osteoarthritis     hands,   . Wears dentures   . Osteoporosis   . Bulging disc   . Hyperlipidemia   . Chronic bronchitis  due to congestion at times, on prednisone and advair  . GERD (gastroesophageal reflux disease)   . Neuropathy     legs stay numb   . Depression     many years ago  . Hypertension     followed by intern med Dr. Kendrick Fries [phone]  . Tubulovillous adenoma of rectum   . Urinary incontinence   . Balance disorder 2008.      Falls a lot.  She can be standing and then leans too far over to one side   . Wears glasses   . Hearing loss     mild  . Iatrogenic adrenal insufficiency      Review of Systems  Constitutional: Negative.  Negative for fever, chills and weight loss.  HENT: Negative.  Negative for ear pain, postnasal drip, rhinorrhea and sore throat.   Eyes: Negative.   Respiratory: Positive for cough. Negative for hemoptysis, chest tightness, shortness of breath and wheezing.   Cardiovascular: Negative.  Negative for chest pain.  Gastrointestinal: Negative.  Negative for  heartburn.  Genitourinary: Negative.   Musculoskeletal: Positive for myalgias.  Skin: Negative.  Negative for rash.  Neurological: Negative.  Negative for headaches.       Objective:   Physical Exam  Vitals reviewed. Constitutional: She is oriented to person, place, and time. She appears well-developed and well-nourished.  HENT:  Head: Normocephalic and atraumatic.  Right Ear: External ear normal.  Left Ear: External ear normal.  Nose: Nose normal.  Mouth/Throat: Oropharynx is clear and moist.  Eyes: Conjunctivae are normal. Pupils are equal, round, and reactive to light.  Neck: Normal range of motion. Neck supple.  Cardiovascular: Normal rate and regular rhythm.   Pulmonary/Chest: Effort normal. No respiratory distress. She has wheezes. She has no rales. She exhibits no tenderness.  Abdominal: Soft. Bowel sounds are normal.  Lymphadenopathy:    She has no cervical adenopathy.  Neurological: She is alert and oriented to person, place, and time.  Skin: Skin is warm and dry.      Assessment & Plan:  Acute bronchitis - Plan: azithromycin (ZITHROMAX) 250 MG tablet, predniSONE (DELTASONE) 20 MG tablet

## 2013-10-16 NOTE — Patient Instructions (Signed)
The majority of colds are caused by viruses and do not require antibiotics. Please read the rest of this hand out to learn more about the common cold and what you can do to help yourself as well as help prevent the over use of antibiotics.   COMMON COLD SIGNS AND SYMPTOMS - The common cold usually causes nasal congestion, runny nose, and sneezing. A sore throat may be present on the first day but usually resolves quickly. If a cough occurs, it generally develops on about the fourth or fifth day of symptoms, typically when congestion and runny nose are resolving  COMMON COLD COMPLICATIONS - In most cases, colds do not cause serious illness or complications. Most colds last for three to seven days, although many people continue to have symptoms (coughing, sneezing, congestion) for up to two weeks.  One of the more common complications is sinusitis, which is usually caused by viruses and rarely (about 2 percent of the time) by bacteria. Having thick or yellow to green-colored nasal discharge does not mean that bacterial sinusitis has developed; discolored nasal discharge is a normal phase of the common cold.  Lower respiratory infections, such as pneumonia or bronchitis, may develop following a cold.  Infection of the middle ear, or otitis media, can accompany or follow a cold.  COMMON COLD TREATMENT - There is no specific treatment for the viruses that cause the common cold. Most treatments are aimed at relieving some of the symptoms of the cold, but do not shorten or cure the cold. Antibiotics are not useful for treating the common cold; antibiotics are only used to treat illnesses caused by bacteria, not viruses. Unnecessary use of antibiotics for the treatment of the common cold can cause allergic reactions, diarrhea, or other gastrointestinal symptoms in some patients.  The symptoms of a cold will resolve over time, even without any treatment. People with underlying medical conditions and those who use  other over-the-counter or prescription medications should speak with their healthcare provider or pharmacist to ensure that it is safe to use these treatments. The following are treatments that may reduce the symptoms caused by the common cold.  Nasal congestion - Decongestants are good for nasal congestion- if you feel very stuffy but no mucus is coming out, this is the medication that will help you the most.  Pseudoephedrine is a decongestant that can improve nasal congestion. Although a prescription is not required, drugstores in the [place] keep pseudoephedrine behind the counter, so it must be requested from a pharmacist. If you have a heart condition or high blood pressure please use Coricidin BPH instead.   Runny nose - Antihistamines such as diphenhydramine (Benadryl), certazine (Zyrtec) which are best taking at night because they can make you tired OR loratadine (Claritin),  fexafinadine (Allegra) help with a runny nose.   Nasal sprays such an oxymetazoline (Afrin and others) may also give temporary relief of nasal congestion. However, these sprays should never be used for more than two to three days; use for more than three days use can worsen congestion.  Nasocort is now over the counter and can help decrease a runny nose. Please stop the medication if you have blurry vision or nose bleeds.   Sore throat and headache - Sore throat and headache are best treated with a mild pain reliever such as acetaminophen (Tylenol) or a non-steroidal anti-inflammatory agent such as ibuprofen or naproxen (Motrin or Aleve). These medications should be taken with food to prevent stomach problems. As well as   gargling with warm water and salt.   Cough - Common cough medicine ingredients include guaifenesin and dextromethorphan; these are often combined with other medications in over-the-counter cold formulas. Often a cough is worse at night or first in the morning due to post nasal drip from you nose. You can  try to sleep at an angle to decrease a cough.   Alternative treatments - Heated, humidified air can improve symptoms of nasal congestion and runny nose, and causes few to no side effects. A number of alternative products, including vitamin C, doubling up on your vitamin D and herbal products such as echinacea, may help. Certain products, such as nasal gels that contain zinc (eg, Zicam), have been associated with a permanent loss of smell.  Antibiotics - Antibiotics should not be used to treat an uncomplicated common cold. As noted above, colds are caused by viruses. Antibiotics treat bacterial, not viral infections. Some viruses that cause the common cold can also depress the immune system or cause swelling in the lining of the nose or airways; this can, in turn, lead to a bacterial infection. Often you need to give your body 7 days to fight off a common cold while treating the symptoms with the medications listed above. If after 7 days your symptoms are not improving, you are getting worse, you have shortness of breath, chest pain, a fever of over 103 you should seek medical help immediately.   PREVENTION IS THE BEST MEDICINE - Hand washing is an essential and highly effective way to prevent the spread of infection.  Alcohol-based hand rubs are a good alternative for disinfecting hands if a sink is not available.  Hands should be washed before preparing food and eating and after coughing, blowing the nose, or sneezing. While it is not always possible to limit contact with people who may be infected with a cold, touching the eyes, nose, or mouth after direct contact should be avoided when possible. Sneezing/coughing into the sleeve of one's clothing (at the inner elbow) is another means of containing sprays of saliva and secretions and does not contaminate the hands.     

## 2013-10-30 ENCOUNTER — Ambulatory Visit: Payer: Self-pay

## 2013-11-07 ENCOUNTER — Other Ambulatory Visit: Payer: Self-pay | Admitting: Internal Medicine

## 2013-11-16 ENCOUNTER — Ambulatory Visit (INDEPENDENT_AMBULATORY_CARE_PROVIDER_SITE_OTHER): Payer: Medicare Other | Admitting: Physician Assistant

## 2013-11-16 ENCOUNTER — Encounter: Payer: Self-pay | Admitting: Physician Assistant

## 2013-11-16 VITALS — BP 110/68 | HR 80 | Temp 98.2°F | Resp 16 | Ht 64.0 in | Wt 165.0 lb

## 2013-11-16 DIAGNOSIS — N3000 Acute cystitis without hematuria: Secondary | ICD-10-CM

## 2013-11-16 DIAGNOSIS — I10 Essential (primary) hypertension: Secondary | ICD-10-CM

## 2013-11-16 DIAGNOSIS — R7303 Prediabetes: Secondary | ICD-10-CM

## 2013-11-16 DIAGNOSIS — R7309 Other abnormal glucose: Secondary | ICD-10-CM | POA: Diagnosis not present

## 2013-11-16 DIAGNOSIS — E782 Mixed hyperlipidemia: Secondary | ICD-10-CM | POA: Diagnosis not present

## 2013-11-16 DIAGNOSIS — Z79899 Other long term (current) drug therapy: Secondary | ICD-10-CM

## 2013-11-16 DIAGNOSIS — J209 Acute bronchitis, unspecified: Secondary | ICD-10-CM

## 2013-11-16 DIAGNOSIS — E785 Hyperlipidemia, unspecified: Secondary | ICD-10-CM

## 2013-11-16 LAB — CBC WITH DIFFERENTIAL/PLATELET
Basophils Absolute: 0.1 10*3/uL (ref 0.0–0.1)
Basophils Relative: 1 % (ref 0–1)
EOS ABS: 0.3 10*3/uL (ref 0.0–0.7)
EOS PCT: 3 % (ref 0–5)
HCT: 44.6 % (ref 36.0–46.0)
HEMOGLOBIN: 15 g/dL (ref 12.0–15.0)
LYMPHS ABS: 1.5 10*3/uL (ref 0.7–4.0)
Lymphocytes Relative: 15 % (ref 12–46)
MCH: 31.3 pg (ref 26.0–34.0)
MCHC: 33.6 g/dL (ref 30.0–36.0)
MCV: 92.9 fL (ref 78.0–100.0)
MONOS PCT: 6 % (ref 3–12)
Monocytes Absolute: 0.6 10*3/uL (ref 0.1–1.0)
Neutro Abs: 7.4 10*3/uL (ref 1.7–7.7)
Neutrophils Relative %: 75 % (ref 43–77)
Platelets: 305 10*3/uL (ref 150–400)
RBC: 4.8 MIL/uL (ref 3.87–5.11)
RDW: 13.4 % (ref 11.5–15.5)
WBC: 9.8 10*3/uL (ref 4.0–10.5)

## 2013-11-16 LAB — LIPID PANEL
CHOLESTEROL: 164 mg/dL (ref 0–200)
HDL: 70 mg/dL (ref >39)
LDL Cholesterol: 49 mg/dL (ref 0–99)
Total CHOL/HDL Ratio: 2.3 Ratio
Triglycerides: 226 mg/dL — ABNORMAL HIGH (ref <150)
VLDL: 45 mg/dL — AB (ref 0–40)

## 2013-11-16 LAB — BASIC METABOLIC PANEL WITH GFR
BUN: 18 mg/dL (ref 6–23)
CALCIUM: 9.3 mg/dL (ref 8.4–10.5)
CO2: 29 meq/L (ref 19–32)
CREATININE: 1.03 mg/dL (ref 0.50–1.10)
Chloride: 100 mEq/L (ref 96–112)
GFR, Est African American: 61 mL/min
GFR, Est Non African American: 53 mL/min — ABNORMAL LOW
GLUCOSE: 83 mg/dL (ref 70–99)
Potassium: 3.6 mEq/L (ref 3.5–5.3)
Sodium: 138 mEq/L (ref 135–145)

## 2013-11-16 LAB — HEMOGLOBIN A1C
HEMOGLOBIN A1C: 5.7 % — AB (ref <5.7)
Mean Plasma Glucose: 117 mg/dL — ABNORMAL HIGH (ref <117)

## 2013-11-16 LAB — HEPATIC FUNCTION PANEL
ALBUMIN: 4 g/dL (ref 3.5–5.2)
ALT: 20 U/L (ref 0–35)
AST: 20 U/L (ref 0–37)
Alkaline Phosphatase: 48 U/L (ref 39–117)
Bilirubin, Direct: 0.1 mg/dL (ref 0.0–0.3)
Indirect Bilirubin: 0.3 mg/dL (ref 0.2–1.2)
Total Bilirubin: 0.4 mg/dL (ref 0.2–1.2)
Total Protein: 6.2 g/dL (ref 6.0–8.3)

## 2013-11-16 LAB — MAGNESIUM: MAGNESIUM: 1.9 mg/dL (ref 1.5–2.5)

## 2013-11-16 LAB — TSH: TSH: 1.269 u[IU]/mL (ref 0.350–4.500)

## 2013-11-16 MED ORDER — AZITHROMYCIN 250 MG PO TABS: ORAL_TABLET | ORAL | Status: DC

## 2013-11-16 MED ORDER — NYSTATIN 100000 UNIT/ML MT SUSP: OROMUCOSAL | Status: DC

## 2013-11-16 NOTE — Progress Notes (Signed)
HPI Patient presents for 3 month follow up with hypertension, hyperlipidemia, prediabetes and vitamin D. Patient's blood pressure has been controlled at home, today their BP is BP: 110/68 mmHg  Patient denies chest pain, shortness of breath, dizziness.  Patient's cholesterol is diet controlled. In addition they are on Pravachol and denies myalgias. The cholesterol last visit was LDL 50.  Her potassium last visit was 3.2 with CO2 of 33. She is on potassium 20 meq TID. Denies muscle cramps.  She has CKD with GFR 52 last visit, CR 1.05, BUN 17.  The patient has been working on diet and exercise for prediabetes, and denies changes in vision, polys, and paresthesias. A1C 5.7  She has been treated for recurrent bronchitis and states she continues to have chest congestion but states she does not have a cold. She occ has yellow productive cough, sometimes SOB with wheezing.  CT CHEST July 2014 IMPRESSION:  1. No evidence of acute pulmonary embolus.  2. Pulmonary atelectasis and scarring not significantly changed  since 2010.  3. Aortic and coronary atherosclerosis.  She has had a recurrent infection, one in Oct with her physical, retreated in Dec with Cipro. She states she is no longer have LUTS.  Patient is on Vitamin D supplement.   Current Medications:  Current Outpatient Prescriptions on File Prior to Visit  Medication Sig Dispense Refill  . acetaminophen (TYLENOL) 325 MG tablet Take 1-2 tablets (325-650 mg total) by mouth every 6 (six) hours as needed.  90 tablet  0  . ADVAIR DISKUS 250-50 MCG/DOSE AEPB INHALE ONE PUFF BY MOUTH TWICE DAILY  60 each  6  . amitriptyline (ELAVIL) 10 MG tablet Take 10 mg by mouth 2 (two) times daily.       Marland Kitchen aspirin 81 MG tablet Take 81 mg by mouth daily.       Marland Kitchen azithromycin (ZITHROMAX) 250 MG tablet 2 pills the first day and then one pill daily for 4 days.  6 tablet  2  . bisacodyl (DULCOLAX) 10 MG suppository Place 1 suppository (10 mg total) rectally daily as  needed.  12 suppository    . cholecalciferol (VITAMIN D) 1000 UNITS tablet Take 5 tablets (5,000 Units total) by mouth daily.      . Cholecalciferol (VITAMIN D-3) 5000 UNITS TABS Take 1 tablet by mouth daily.       Marland Kitchen HYDROcodone-acetaminophen (NORCO/VICODIN) 5-325 MG per tablet Take 1-2 tablets by mouth every 6 (six) hours as needed for pain.  60 tablet  0  . losartan-hydrochlorothiazide (HYZAAR) 100-25 MG per tablet Take 1 tablet by mouth daily.       . metoprolol tartrate (LOPRESSOR) 12.5 mg TABS Take 0.5 tablets (12.5 mg total) by mouth 2 (two) times daily.  30 tablet  0  . Multiple Vitamin (MULTIVITAMIN) capsule Take 1 capsule by mouth daily.       Marland Kitchen omeprazole (PRILOSEC) 20 MG capsule Take 20 mg by mouth 2 (two) times daily.       Marland Kitchen omeprazole (PRILOSEC) 40 MG capsule TAKE ONE CAPSULE BY MOUTH TWICE DAILY FOR  ACID  REFLUX  60 capsule  3  . oxybutynin (DITROPAN) 5 MG tablet Take 5 mg by mouth daily.       . Potassium Chloride ER 20 MEQ TBCR Take 20 mEq by mouth 3 (three) times daily.  90 tablet  2  . pravastatin (PRAVACHOL) 40 MG tablet TAKE ONE TABLET BY MOUTH EVERY DAY FOR CHOLESTEROL  90 tablet  1  .  predniSONE (DELTASONE) 10 MG tablet 1 po tid x 3 days, 1 po bid x 3days, 1 po qd x 5 days  20 tablet  0  . predniSONE (DELTASONE) 20 MG tablet Take one pill two times daily for 3 days, take one pill daily for 4 days.  10 tablet  0  . predniSONE (DELTASONE) 5 MG tablet Take 10 mg by mouth daily.      . traMADol (ULTRAM) 50 MG tablet Take 50 mg by mouth 2 (two) times daily as needed. For coughing       No current facility-administered medications on file prior to visit.   Medical History:  Past Medical History  Diagnosis Date  . Incontinence   . Osteoarthritis     hands,   . Wears dentures   . Osteoporosis   . Bulging disc   . Hyperlipidemia   . Chronic bronchitis     due to congestion at times, on prednisone and advair  . GERD (gastroesophageal reflux disease)   . Neuropathy      legs stay numb   . Depression     many years ago  . Hypertension     followed by intern med Dr. Marisue [place] [phone]  . Tubulovillous adenoma of rectum   . Urinary incontinence   . Balance disorder 2008.      Falls a lot.  She can be standing and then leans too far over to one side   . Wears glasses   . Hearing loss     mild  . Iatrogenic adrenal insufficiency    Allergies:  Allergies  Allergen Reactions  . Codeine Other (See Comments)    hallucinations  . Demerol Other (See Comments)    hallucinations  . Morphine And Related Other (See Comments)    unknown  . Other Other (See Comments)    Invan 7- pt became red all over  . Sulfate Other (See Comments)    unknown    ROS Constitutional: Denies fever, chills, headaches, insomnia, fatigue, night sweats Eyes: Denies redness, blurred vision, diplopia, discharge, itchy, watery eyes.  ENT: Denies congestion, post nasal drip, sore throat, earache, dental pain, Tinnitus, Vertigo, Sinus pain, snoring.  Cardio: Denies chest pain, palpitations, irregular heartbeat, dyspnea, diaphoresis, orthopnea, PND, claudication, edema Respiratory: denies cough, shortness of breath, wheezing.  Gastrointestinal: Denies dysphagia, heartburn, AB pain/ cramps, N/V, diarrhea, constipation, hematemesis, melena, hematochezia,  hemorrhoids Genitourinary: Denies dysuria, frequency, urgency, nocturia, hesitancy, discharge, hematuria, flank pain Musculoskeletal: Denies myalgia, stiffness, pain, swelling and strain/sprain. Skin: Denies pruritis, rash, changing in skin lesion Neuro: Denies Weakness, tremor, incoordination, spasms, pain Psychiatric: Denies confusion, memory loss, sensory loss Endocrine: Denies change in weight, skin, hair change, nocturia Diabetic Polys, Denies visual blurring, hyper /hypo glycemic episodes, and paresthesia, Heme/Lymph: Denies Excessive bleeding, bruising, enlarged lymph nodes  Family history- Review and unchanged Social history-  Review and unchanged Physical Exam: Filed Vitals:   11/16/13 1001  BP: 110/68  Pulse: 80  Temp: 98.2 F (36.8 C)  Resp: 16   Filed Weights   11/16/13 1001  Weight: 165 lb (74.844 kg)   General Appearance: Well nourished, in no apparent distress. Eyes: PERRLA, EOMs, conjunctiva no swelling or erythema Sinuses: No Frontal/maxillary tenderness ENT/Mouth: Ext aud canals clear, TMs without erythema, bulging. No erythema, swelling, or exudate on post pharynx.  Tonsils not swollen or erythematous. Hearing normal. + yeast on tongue Neck: Supple, thyroid normal.  Respiratory: Respiratory effort normal, Bilateral bases with wheezing/crackles, otherwise CTAB.  Cardio: RRR with  no MRGs. 1+ edema Abdomen: Soft, + BS.  Non tender, no guarding, rebound, hernias, masses. Lymphatics: Non tender without lymphadenopathy.  Musculoskeletal: Full ROM, 5/5 strength, wide stance/antalgic gait.  Skin: Warm, dry without rashes, lesions, ecchymosis.  Neuro: Cranial nerves intact. Normal muscle tone, no cerebellar symptoms.  Psych: Awake and oriented X 3, normal affect, Insight and Judgment appropriate.   Assessment and Plan:  Hypertension: Continue medication, monitor blood pressure at home.  Continue DASH diet. Cholesterol: Continue diet and exercise. Check cholesterol.  Pre-diabetes-Continue diet and exercise. Check A1C Vitamin D Def- check level and continue medications.  COPD- try Advair HFA with MDI, Duonebs at home BID, and can add on turdorza samples- zpak sent in to [facility] in case gets worse.  Yeast mouth- nystatin mouth wash UTI- recheck urine.   OVER 40 minutes of exam, counseling, chart review, referral performed Continue diet and meds as discussed. Further disposition pending results of labs.  Vicie Mutters 10:10 AM

## 2013-11-16 NOTE — Patient Instructions (Signed)
Start on [place] at least once daily but can do twice daily.  Please wash your mouth out well after you use Advair  Chronic Obstructive Pulmonary Disease Chronic obstructive pulmonary disease (COPD) is a common lung condition in which airflow from the lungs is limited. COPD is a general term that can be used to describe many different lung problems that limit airflow, including both chronic bronchitis and emphysema. If you have COPD, your lung function will probably never return to normal, but there are measures you can take to improve lung function and make yourself feel better.  CAUSES   Smoking (common).   Exposure to secondhand smoke.   Genetic problems.  Chronic inflammatory lung diseases or recurrent infections. SYMPTOMS   Shortness of breath, especially with physical activity.   Deep, persistent (chronic) cough with a large amount of thick mucus.   Wheezing.   Rapid breaths (tachypnea).   Gray or bluish discoloration (cyanosis) of the skin, especially in fingers, toes, or lips.   Fatigue.   Weight loss.   Frequent infections or episodes when breathing symptoms become much worse (exacerbations).   Chest tightness. DIAGNOSIS  Your healthcare provider will take a medical history and perform a physical examination to make the initial diagnosis. Additional tests for COPD may include:   Lung (pulmonary) function tests.  Chest X-ray.  CT scan.  Blood tests. TREATMENT  Treatment available to help you feel better when you have COPD include:   Inhaler and nebulizer medicines. These help manage the symptoms of COPD and make your breathing more comfortable  Supplemental oxygen. Supplemental oxygen is only helpful if you have a low oxygen level in your blood.   Exercise and physical activity. These are beneficial for nearly all people with COPD. Some people may also benefit from a pulmonary rehabilitation program. HOME CARE INSTRUCTIONS   Take all medicines  (inhaled or pills) as directed by your health care provider.  Only take over-the-counter or prescription medicines for pain, fever, or discomfort as directed by your health care provider.   Avoid over-the-counter medicines or cough syrups that dry up your airway (such as antihistamines) and slow down the elimination of secretions unless instructed otherwise by your healthcare provider.   If you are a smoker, the most important thing that you can do is stop smoking. Continuing to smoke will cause further lung damage and breathing trouble. Ask your health care provider for help with quitting smoking. He or she can direct you to community resources or hospitals that provide support.  Avoid exposure to irritants such as smoke, chemicals, and fumes that aggravate your breathing.  Use oxygen therapy and pulmonary rehabilitation if directed by your health care provider. If you require home oxygen therapy, ask your healthcare provider whether you should purchase a pulse oximeter to measure your oxygen level at home.   Avoid contact with individuals who have a contagious illness.  Avoid extreme temperature and humidity changes.  Eat healthy foods. Eating smaller, more frequent meals and resting before meals may help you maintain your strength.  Stay active, but balance activity with periods of rest. Exercise and physical activity will help you maintain your ability to do things you want to do.  Preventing infection and hospitalization is very important when you have COPD. Make sure to receive all the vaccines your health care provider recommends, especially the pneumococcal and influenza vaccines. Ask your healthcare provider whether you need a pneumonia vaccine.  Learn and use relaxation techniques to manage stress.  Learn  and use controlled breathing techniques as directed by your health care provider. Controlled breathing techniques include:   Pursed lip breathing. Start by breathing in  (inhaling) through your nose for 1 second. Then, purse your lips as if you were going to whistle and breathe out (exhale) through the pursed lips for 2 seconds.   Diaphragmatic breathing. Start by putting one hand on your abdomen just above your waist. Inhale slowly through your nose. The hand on your abdomen should move out. Then purse your lips and exhale slowly. You should be able to feel the hand on your abdomen moving in as you exhale.   Learn and use controlled coughing to clear mucus from your lungs. Controlled coughing is a series of short, progressive coughs. The steps of controlled coughing are:  1. Lean your head slightly forward.  2. Breathe in deeply using diaphragmatic breathing.  3. Try to hold your breath for 3 seconds.  4. Keep your mouth slightly open while coughing twice.  5. Spit any mucus out into a tissue.  6. Rest and repeat the steps once or twice as needed. SEEK MEDICAL CARE IF:   You are coughing up more mucus than usual.   There is a change in the color or thickness of your mucus.   Your breathing is more labored than usual.   Your breathing is faster than usual.  SEEK IMMEDIATE MEDICAL CARE IF:   You have shortness of breath while you are resting.   You have shortness of breath that prevents you from:  Being able to talk.   Performing your usual physical activities.   You have chest pain lasting longer than 5 minutes.   Your skin color is more cyanotic than usual.  You measure low oxygen saturations for longer than 5 minutes with a pulse oximeter. MAKE SURE YOU:   Understand these instructions.  Will watch your condition.  Will get help right away if you are not doing well or get worse. Document Released: 07/15/2005 Document Revised: 07/26/2013 Document Reviewed: 06/01/2013 [facility] Patient Information 2014 [place], [place].

## 2013-11-17 ENCOUNTER — Other Ambulatory Visit: Payer: Self-pay

## 2013-11-17 LAB — URINALYSIS, ROUTINE W REFLEX MICROSCOPIC
BILIRUBIN URINE: NEGATIVE
Glucose, UA: NEGATIVE mg/dL
HGB URINE DIPSTICK: NEGATIVE
KETONES UR: NEGATIVE mg/dL
Nitrite: NEGATIVE
PROTEIN: NEGATIVE mg/dL
Specific Gravity, Urine: 1.015 (ref 1.005–1.030)
UROBILINOGEN UA: 0.2 mg/dL (ref 0.0–1.0)
pH: 5.5 (ref 5.0–8.0)

## 2013-11-17 LAB — URINALYSIS, MICROSCOPIC ONLY
Bacteria, UA: NONE SEEN
CASTS: NONE SEEN
CRYSTALS: NONE SEEN
Squamous Epithelial / LPF: NONE SEEN

## 2013-11-17 LAB — URINE CULTURE: Colony Count: 40000

## 2013-11-28 ENCOUNTER — Ambulatory Visit (INDEPENDENT_AMBULATORY_CARE_PROVIDER_SITE_OTHER): Payer: Medicare Other | Admitting: Internal Medicine

## 2013-11-28 ENCOUNTER — Encounter: Payer: Self-pay | Admitting: Internal Medicine

## 2013-11-28 VITALS — BP 136/80 | HR 76 | Temp 98.1°F | Resp 18 | Wt 163.6 lb

## 2013-11-28 DIAGNOSIS — J209 Acute bronchitis, unspecified: Secondary | ICD-10-CM

## 2013-11-28 DIAGNOSIS — J449 Chronic obstructive pulmonary disease, unspecified: Secondary | ICD-10-CM | POA: Diagnosis not present

## 2013-11-28 MED ORDER — LEVOFLOXACIN 500 MG PO TABS: 500.0000 mg | ORAL_TABLET | Freq: Every day | ORAL | Status: AC

## 2013-11-28 NOTE — Patient Instructions (Signed)
  Buy Mucus Relief 400 mg and take   2 tablets 3 x day with meals for mucus thinning       Chronic Obstructive Pulmonary Disease Exacerbation  Chronic obstructive pulmonary disease (COPD) is a common lung problem. In COPD, the flow of air from the lungs is limited. COPD exacerbations are times that breathing gets worse and you need extra treatment. Without treatment they can be life threatening. If they happen often, your lungs can become more damaged. HOME CARE  Do not smoke.  Avoid tobacco smoke and other things that bother your lungs.  If given, take your antibiotic medicine as told. Finish the medicine even if you start to feel better.  Only take medicines as told by your doctor.  Drink enough fluids to keep your pee (urine) clear or pale yellow (unless your doctor has told you not to).  Use a cool mist machine (vaporizer).  If you use oxygen or a machine that turns liquid medicine into a mist (nebulizer), continue to use them as told.  Keep up with shots (vaccinations) as told by your doctor.  Exercise regularly.  Eat healthy foods.  Keep all doctor visits as told. GET HELP RIGHT AWAY IF:  You are very short of breath and it gets worse.  You have trouble talking.  You have bad chest pain.  You have blood in your spit (sputum).  You have a fever.  You keep throwing up (vomiting).  You feel weak, or you pass out (faint).  You feel confused.  You keep getting worse. MAKE SURE YOU:   Understand these instructions.  Will watch your condition.  Will get help right away if you are not doing well or get worse. Document Released: 09/24/2011 Document Revised: 07/26/2013 Document Reviewed: 06/09/2013 [facility] Patient Information 2014 [place].

## 2013-11-28 NOTE — Progress Notes (Signed)
Subjective:    Patient ID: Marie [name], female    DOB: 06/21/1936, 78 y.o.   MRN: [id number]  Cough This is a recurrent problem. The current episode started more than 1 month ago. The problem has been gradually worsening. The cough is non-productive. Associated symptoms include chest pain, a fever, headaches, nasal congestion, rhinorrhea, a sore throat, shortness of breath and wheezing. Pertinent negatives include no chills, ear congestion, ear pain, heartburn, hemoptysis, rash, sweats or weight loss. The symptoms are aggravated by pollens and fumes. She has tried ipratropium inhaler, oral steroids, a beta-agonist inhaler, steroid inhaler and prescription cough suppressant for the symptoms. The treatment provided no relief. Her past medical history is significant for asthma, bronchitis, COPD, environmental allergies and pneumonia. There is no history of bronchiectasis or emphysema.   He has been treated 3 x over the lat 3 months with a Z-Pak and steroid pulse taper, then back to her maintenance steroid dosing with never full resolution of her symptoms of non-productive cough and intermittant wheezing.    Medication List       This list is accurate as of: 11/28/13  8:21 PM.  Always use your most recent med list.               acetaminophen 325 MG tablet  Commonly known as:  TYLENOL  Take 1-2 tablets (325-650 mg total) by mouth every 6 (six) hours as needed.     ADVAIR DISKUS 250-50 MCG/DOSE Aepb  Generic drug:  Fluticasone-Salmeterol  INHALE ONE PUFF BY MOUTH TWICE DAILY     amitriptyline 10 MG tablet  Commonly known as:  ELAVIL  Take 10 mg by mouth 2 (two) times daily.     aspirin 81 MG tablet  Take 81 mg by mouth daily.     bisacodyl 10 MG suppository  Commonly known as:  DULCOLAX  Place 1 suppository (10 mg total) rectally daily as needed.     cholecalciferol 1000 UNITS tablet  Commonly known as:  VITAMIN D  Take 5 tablets (5,000 Units total) by mouth daily.     HYDROcodone-acetaminophen 5-325 MG per tablet  Commonly known as:  NORCO/VICODIN  Take 1-2 tablets by mouth every 6 (six) hours as needed for pain.     levofloxacin 500 MG tablet  Commonly known as:  LEVAQUIN  Take 1 tablet (500 mg total) by mouth daily. with food for infection     losartan-hydrochlorothiazide 100-25 MG per tablet  Commonly known as:  HYZAAR  Take 1 tablet by mouth daily.     metoprolol tartrate 12.5 mg Tabs tablet  Commonly known as:  LOPRESSOR  Take 0.5 tablets (12.5 mg total) by mouth 2 (two) times daily.     multivitamin capsule  Take 1 capsule by mouth daily.     nystatin 100000 UNIT/ML suspension  Commonly known as:  MYCOSTATIN  5 ml swish and spit 4 times a day for 2 days after symptoms resolve     omeprazole 20 MG capsule  Commonly known as:  PRILOSEC  Take 20 mg by mouth 2 (two) times daily.     oxybutynin 5 MG tablet  Commonly known as:  DITROPAN  Take 5 mg by mouth daily.     Potassium Chloride ER 20 MEQ Tbcr  Take 20 mEq by mouth 3 (three) times daily.     pravastatin 40 MG tablet  Commonly known as:  PRAVACHOL  TAKE ONE TABLET BY MOUTH EVERY DAY FOR CHOLESTEROL  predniSONE 5 MG tablet  Commonly known as:  DELTASONE  Take 10 mg by mouth daily.     traMADol 50 MG tablet  Commonly known as:  ULTRAM  Take 50 mg by mouth 2 (two) times daily as needed. For coughing         Allergies  Allergen Reactions  . Codeine Other (See Comments)    hallucinations  . Demerol Other (See Comments)    hallucinations  . Morphine And Related Other (See Comments)    unknown  . Other Other (See Comments)    Invan 7- pt became red all over  . Sulfate Other (See Comments)    unknown     Past Medical History  Diagnosis Date  . Incontinence   . Osteoarthritis     hands,   . Wears dentures   . Osteoporosis   . Bulging disc   . Hyperlipidemia   . Chronic bronchitis     due to congestion at times, on prednisone and advair  . GERD  (gastroesophageal reflux disease)   . Neuropathy     legs stay numb   . Depression     many years ago  . Hypertension     followed by intern med Dr. Marisue Brooklyn [phone]  . Tubulovillous adenoma of rectum   . Urinary incontinence   . Balance disorder 2008.      Falls a lot.  She can be standing and then leans too far over to one side   . Wears glasses   . Hearing loss     mild  . Iatrogenic adrenal insufficiency    Review of Systems  Constitutional: Positive for fever. Negative for chills and weight loss.  HENT: Positive for rhinorrhea and sore throat. Negative for ear pain.   Respiratory: Positive for cough, shortness of breath and wheezing. Negative for hemoptysis.   Cardiovascular: Positive for chest pain.  Gastrointestinal: Negative for heartburn.  Skin: Negative for rash.  Allergic/Immunologic: Positive for environmental allergies.  Neurological: Positive for headaches.       Objective:   Physical Exam  Constitutional: She is oriented to person, place, and time.  Appears mildly cushenoid facies.  HENT:  Right Ear: External ear normal.  Left Ear: External ear normal.  Mouth/Throat: Oropharynx is clear and moist. No oropharyngeal exudate.  negative  Eyes: Conjunctivae and EOM are normal. Pupils are equal, round, and reactive to light. Right eye exhibits discharge. Left eye exhibits no discharge.  Neck: Normal range of motion. Neck supple. No JVD present. No thyromegaly present.  Cardiovascular: Normal rate, regular rhythm and normal heart sounds.   No murmur heard. Pulmonary/Chest: Effort normal. No respiratory distress. She has wheezes. She has rales. She exhibits tenderness.  Abdominal: Soft. Bowel sounds are normal. She exhibits no distension. There is no tenderness. There is no guarding.  Musculoskeletal: Normal range of motion. She exhibits no edema.  Gait sl broad-based   Lymphadenopathy:    She has no cervical adenopathy.  Neurological: She is alert and oriented  to person, place, and time. She displays normal reflexes. No cranial nerve deficit. Coordination normal.  Psychiatric: She has a normal mood and affect. Thought content normal.   Assessment & Plan:  1. Acute bronchitis  - levofloxacin (LEVAQUIN) 500 MG tablet; Take 1 tablet (500 mg total) by mouth daily. with food for infection  Dispense: 15 tablet; Refill: 1  2. Obstructive chronic bronchitis without exacerbation

## 2013-12-05 ENCOUNTER — Ambulatory Visit: Payer: Self-pay | Admitting: Internal Medicine

## 2013-12-05 ENCOUNTER — Emergency Department (HOSPITAL_COMMUNITY): Payer: Medicare Other

## 2013-12-05 ENCOUNTER — Emergency Department (HOSPITAL_COMMUNITY)
Admission: EM | Admit: 2013-12-05 | Discharge: 2013-12-05 | Disposition: A | Discharge status: Home / Self Care | Payer: Medicare Other | Attending: Emergency Medicine | Admitting: Emergency Medicine

## 2013-12-05 ENCOUNTER — Encounter (HOSPITAL_COMMUNITY): Payer: Self-pay | Admitting: Emergency Medicine

## 2013-12-05 DIAGNOSIS — J441 Chronic obstructive pulmonary disease with (acute) exacerbation: Secondary | ICD-10-CM

## 2013-12-05 DIAGNOSIS — I10 Essential (primary) hypertension: Secondary | ICD-10-CM | POA: Diagnosis not present

## 2013-12-05 DIAGNOSIS — Z98811 Dental restoration status: Secondary | ICD-10-CM | POA: Diagnosis not present

## 2013-12-05 DIAGNOSIS — Z79899 Other long term (current) drug therapy: Secondary | ICD-10-CM | POA: Diagnosis not present

## 2013-12-05 DIAGNOSIS — R0609 Other forms of dyspnea: Secondary | ICD-10-CM | POA: Diagnosis not present

## 2013-12-05 DIAGNOSIS — H9190 Unspecified hearing loss, unspecified ear: Secondary | ICD-10-CM | POA: Insufficient documentation

## 2013-12-05 DIAGNOSIS — K219 Gastro-esophageal reflux disease without esophagitis: Secondary | ICD-10-CM | POA: Insufficient documentation

## 2013-12-05 DIAGNOSIS — E785 Hyperlipidemia, unspecified: Secondary | ICD-10-CM | POA: Diagnosis not present

## 2013-12-05 DIAGNOSIS — Z9089 Acquired absence of other organs: Secondary | ICD-10-CM | POA: Diagnosis not present

## 2013-12-05 DIAGNOSIS — R000 Tachycardia, unspecified: Secondary | ICD-10-CM | POA: Diagnosis not present

## 2013-12-05 DIAGNOSIS — F3289 Other specified depressive episodes: Secondary | ICD-10-CM | POA: Diagnosis not present

## 2013-12-05 DIAGNOSIS — M19049 Primary osteoarthritis, unspecified hand: Secondary | ICD-10-CM | POA: Insufficient documentation

## 2013-12-05 DIAGNOSIS — F329 Major depressive disorder, single episode, unspecified: Secondary | ICD-10-CM | POA: Diagnosis not present

## 2013-12-05 DIAGNOSIS — R0602 Shortness of breath: Secondary | ICD-10-CM | POA: Diagnosis not present

## 2013-12-05 DIAGNOSIS — Z7982 Long term (current) use of aspirin: Secondary | ICD-10-CM | POA: Diagnosis not present

## 2013-12-05 DIAGNOSIS — R059 Cough, unspecified: Secondary | ICD-10-CM | POA: Diagnosis not present

## 2013-12-05 DIAGNOSIS — R05 Cough: Secondary | ICD-10-CM | POA: Diagnosis not present

## 2013-12-05 DIAGNOSIS — R0989 Other specified symptoms and signs involving the circulatory and respiratory systems: Secondary | ICD-10-CM | POA: Diagnosis not present

## 2013-12-05 LAB — CBC WITH DIFFERENTIAL/PLATELET
BASOS ABS: 0.1 10*3/uL (ref 0.0–0.1)
BASOS PCT: 1 % (ref 0–1)
EOS ABS: 0.9 10*3/uL — AB (ref 0.0–0.7)
EOS PCT: 10 % — AB (ref 0–5)
HCT: 47 % — ABNORMAL HIGH (ref 36.0–46.0)
Hemoglobin: 15.6 g/dL — ABNORMAL HIGH (ref 12.0–15.0)
LYMPHS PCT: 29 % (ref 12–46)
Lymphs Abs: 2.8 10*3/uL (ref 0.7–4.0)
MCH: 31.1 pg (ref 26.0–34.0)
MCHC: 33.2 g/dL (ref 30.0–36.0)
MCV: 93.6 fL (ref 78.0–100.0)
Monocytes Absolute: 0.4 10*3/uL (ref 0.1–1.0)
Monocytes Relative: 4 % (ref 3–12)
Neutro Abs: 5.5 10*3/uL (ref 1.7–7.7)
Neutrophils Relative %: 56 % (ref 43–77)
Platelets: 280 10*3/uL (ref 150–400)
RBC: 5.02 MIL/uL (ref 3.87–5.11)
RDW: 13.3 % (ref 11.5–15.5)
WBC: 9.7 10*3/uL (ref 4.0–10.5)

## 2013-12-05 LAB — BASIC METABOLIC PANEL
BUN: 18 mg/dL (ref 6–23)
CALCIUM: 9.2 mg/dL (ref 8.4–10.5)
CO2: 30 mEq/L (ref 19–32)
CREATININE: 0.99 mg/dL (ref 0.50–1.10)
Chloride: 101 mEq/L (ref 96–112)
GFR calc Af Amer: 63 mL/min — ABNORMAL LOW (ref >90)
GFR, EST NON AFRICAN AMERICAN: 54 mL/min — AB (ref >90)
Glucose, Bld: 134 mg/dL — ABNORMAL HIGH (ref 70–99)
Potassium: 3.1 mEq/L — ABNORMAL LOW (ref 3.7–5.3)
SODIUM: 143 meq/L (ref 137–147)

## 2013-12-05 LAB — PRO B NATRIURETIC PEPTIDE: PRO B NATRI PEPTIDE: 101.9 pg/mL (ref 0–450)

## 2013-12-05 MED ORDER — POTASSIUM CHLORIDE CRYS ER 20 MEQ PO TBCR
40.0000 meq | EXTENDED_RELEASE_TABLET | Freq: Once | ORAL | Status: AC
  Administered 2013-12-05: 40 meq via ORAL
  Filled 2013-12-05: qty 2

## 2013-12-05 MED ORDER — PREDNISONE 20 MG PO TABS: 40.0000 mg | ORAL_TABLET | Freq: Every day | ORAL | Status: DC

## 2013-12-05 MED ORDER — BENZONATATE 100 MG PO CAPS
200.0000 mg | ORAL_CAPSULE | Freq: Once | ORAL | Status: AC
Start: 2013-12-05 — End: 2013-12-05
  Administered 2013-12-05: 200 mg via ORAL
  Filled 2013-12-05: qty 2

## 2013-12-05 MED ORDER — BENZONATATE 200 MG PO CAPS: 200.0000 mg | ORAL_CAPSULE | Freq: Three times a day (TID) | ORAL | Status: DC | PRN

## 2013-12-05 MED ORDER — ALBUTEROL (5 MG/ML) CONTINUOUS INHALATION SOLN
7.5000 mg/h | INHALATION_SOLUTION | Freq: Once | RESPIRATORY_TRACT | Status: AC
  Administered 2013-12-05: 7.5 mg/h via RESPIRATORY_TRACT
  Filled 2013-12-05: qty 20

## 2013-12-05 NOTE — Discharge Instructions (Signed)
Chronic Obstructive Pulmonary Disease  Chronic obstructive pulmonary disease (COPD) is a common lung condition in which airflow from the lungs is limited. COPD is a general term that can be used to describe many different lung problems that limit airflow, including both chronic bronchitis and emphysema.  If you have COPD, your lung function will probably never return to normal, but there are measures you can take to improve lung function and make yourself feel better.   CAUSES   · Smoking (common).    · Exposure to secondhand smoke.    · Genetic problems.  · Chronic inflammatory lung diseases or recurrent infections.  SYMPTOMS   · Shortness of breath, especially with physical activity.    · Deep, persistent (chronic) cough with a large amount of thick mucus.    · Wheezing.    · Rapid breaths (tachypnea).    · Gray or bluish discoloration (cyanosis) of the skin, especially in fingers, toes, or lips.    · Fatigue.    · Weight loss.    · Frequent infections or episodes when breathing symptoms become much worse (exacerbations).    · Chest tightness.  DIAGNOSIS   Your healthcare provider will take a medical history and perform a physical examination to make the initial diagnosis.  Additional tests for COPD may include:   · Lung (pulmonary) function tests.  · Chest X-ray.  · CT scan.  · Blood tests.  TREATMENT   Treatment available to help you feel better when you have COPD include:   · Inhaler and nebulizer medicines. These help manage the symptoms of COPD and make your breathing more comfortable  · Supplemental oxygen. Supplemental oxygen is only helpful if you have a low oxygen level in your blood.    · Exercise and physical activity. These are beneficial for nearly all people with COPD. Some people may also benefit from a pulmonary rehabilitation program.  HOME CARE INSTRUCTIONS   · Take all medicines (inhaled or pills) as directed by your health care provider.  · Only take over-the-counter or prescription medicines  for pain, fever, or discomfort as directed by your health care provider.    · Avoid over-the-counter medicines or cough syrups that dry up your airway (such as antihistamines) and slow down the elimination of secretions unless instructed otherwise by your healthcare provider.    · If you are a smoker, the most important thing that you can do is stop smoking. Continuing to smoke will cause further lung damage and breathing trouble. Ask your health care provider for help with quitting smoking. He or she can direct you to community resources or hospitals that provide support.  · Avoid exposure to irritants such as smoke, chemicals, and fumes that aggravate your breathing.  · Use oxygen therapy and pulmonary rehabilitation if directed by your health care provider. If you require home oxygen therapy, ask your healthcare provider whether you should purchase a pulse oximeter to measure your oxygen level at home.    · Avoid contact with individuals who have a contagious illness.  · Avoid extreme temperature and humidity changes.  · Eat healthy foods. Eating smaller, more frequent meals and resting before meals may help you maintain your strength.  · Stay active, but balance activity with periods of rest. Exercise and physical activity will help you maintain your ability to do things you want to do.  · Preventing infection and hospitalization is very important when you have COPD. Make sure to receive all the vaccines your health care provider recommends, especially the pneumococcal and influenza vaccines. Ask your healthcare provider whether you   in (inhaling) through your nose for 1 second. Then, purse your lips as if you were going to whistle and breathe out (exhale)  through the pursed lips for 2 seconds.   Diaphragmatic breathing. Start by putting one hand on your abdomen just above your waist. Inhale slowly through your nose. The hand on your abdomen should move out. Then purse your lips and exhale slowly. You should be able to feel the hand on your abdomen moving in as you exhale.   Learn and use controlled coughing to clear mucus from your lungs. Controlled coughing is a series of short, progressive coughs. The steps of controlled coughing are:  1. Lean your head slightly forward.  2. Breathe in deeply using diaphragmatic breathing.  3. Try to hold your breath for 3 seconds.  4. Keep your mouth slightly open while coughing twice.  5. Spit any mucus out into a tissue.  6. Rest and repeat the steps once or twice as needed. SEEK MEDICAL CARE IF:   You are coughing up more mucus than usual.   There is a change in the color or thickness of your mucus.   Your breathing is more labored than usual.   Your breathing is faster than usual.  SEEK IMMEDIATE MEDICAL CARE IF:   You have shortness of breath while you are resting.   You have shortness of breath that prevents you from:  Being able to talk.   Performing your usual physical activities.   You have chest pain lasting longer than 5 minutes.   Your skin color is more cyanotic than usual.  You measure low oxygen saturations for longer than 5 minutes with a pulse oximeter. MAKE SURE YOU:   Understand these instructions.  Will watch your condition.  Will get help right away if you are not doing well or get worse. Document Released: 07/15/2005 Document Revised: 07/26/2013 Document Reviewed: 06/01/2013 [facility] Patient Information 2014 [place], [place].   Finished taking your antibiotic.  Increase your nebulizer treatments to every 4 hours if you're symptomatic.  Increase your prednisone to the new prescription given for a total of 40 mg once a day for 4 days, and then return  to your regular daily dosing amounts.  Rest, make sure you're drinking plenty of fluids.  Return here for any worsened symptoms.  Plan to see your Dr. for recheck in one week.

## 2013-12-05 NOTE — ED Notes (Signed)
Pt ambulated well with assistance. [id number] pulse-88

## 2013-12-05 NOTE — ED Notes (Signed)
EDP aware of pt O2 SAT upon d/c vitals. PA aware and given verbal order to ambulate pt with pulse oximetry. Ambulated pt and pulse ox 90-92%, HR 93-95. Pt denies any dizziness or malaise. PA aware and reported to continue with discharge. Pt and pt family aware.

## 2013-12-05 NOTE — ED Notes (Addendum)
Per EMS, pt reports productive cough and generalized aches for several days. EMS admin Duo neb,125 solu-medrol en route. EMS obtained CBG en route with a reading of 128.Pt alert and oriented. Airway patent.  Moderate dyspnea noted at time of arrival.

## 2013-12-09 NOTE — ED Provider Notes (Signed)
CSN: [id number]     Arrival date & time 12/05/13  1058 History   First MD Initiated Contact with Patient 12/05/13 1120     Chief Complaint  Patient presents with  . Shortness of Breath     (Consider location/radiation/quality/duration/timing/severity/associated sxs/prior Treatment) HPI Comments: Marie [name] is a 78 y.o. Female with a history of COPD presenting with a 1 week history of cough, productive of clear sputum, shortness of breath and wheezing which has not improved despite treatment.  She has seen her pcp for this condition 1 week ago and is currently on day 7 of a 10 day levaquin course.  She is also using her home albuterol nebulizers, last dose was taken by her last night which helps temporarily, and received a duo neb treatment enroute by ems along with a dose of solu-medrol 125 mg.  She denies chest pain, fevers, chills abdominal pain, nausea or vomiting.  She attempted to contact her pcp, with the office being closed today due to inclement weather.      The history is provided by the patient and the spouse.    Past Medical History  Diagnosis Date  . Incontinence     not indicated at this visit.  . Osteoarthritis     hands,   . Wears dentures   . Osteoporosis   . Bulging disc   . Hyperlipidemia   . Chronic bronchitis     due to congestion at times, on prednisone and advair  . GERD (gastroesophageal reflux disease)   . Neuropathy     legs stay numb   . Depression     many years ago  . Hypertension     followed by intern med Dr. Marisue Brooklyn [phone]  . Tubulovillous adenoma of rectum   . Balance disorder 2008.      Falls a lot.  She can be standing and then leans too far over to one side   . Wears glasses   . Hearing loss     mild  . Iatrogenic adrenal insufficiency    Past Surgical History  Procedure Laterality Date  . Incontinence surgery      multiple procedures, not cured  . Rectal surgery      by dr. Marlou Starks, removal of polyp  . Appendectomy    .  Abdominal hysterectomy    . Tonsillectomy    . Eye surgery      bilateral cataract surgery and lens implant  . Dilation and curettage of uterus    . Rectal biopsy  09/21/2011    Procedure: BIOPSY RECTAL;  Surgeon: Merrie Roof, MD;  Location: [facility];  Service: General;  Laterality: N/A;  3-4 cm  . Finger surgery      fusions and debridements for OA  . External fixation leg  10/25/2012    Procedure: EXTERNAL FIXATION LEG;  Surgeon: Rozanna Box, MD;  Location: [facility];  Service: Orthopedics;  Laterality: Right;   Family History  Problem Relation Age of Onset  . Cancer Sister     pt unaware of what kind  . High blood pressure Mother   . COPD Mother   . Heart attack Father    History  Substance Use Topics  . Smoking status: Never Smoker   . Smokeless tobacco: Never Used  . Alcohol Use: No   OB History   Grav Para Term Preterm Abortions TAB SAB Ect Mult Living  Review of Systems  Constitutional: Positive for fatigue. Negative for fever and chills.  HENT: Negative for congestion and sore throat.   Eyes: Negative.   Respiratory: Positive for cough, shortness of breath and wheezing. Negative for chest tightness.   Cardiovascular: Negative for chest pain.  Gastrointestinal: Negative for nausea and abdominal pain.  Genitourinary: Negative.   Musculoskeletal: Positive for myalgias. Negative for arthralgias, joint swelling and neck pain.  Skin: Negative.  Negative for rash and wound.  Neurological: Negative for dizziness, weakness, light-headedness, numbness and headaches.  Psychiatric/Behavioral: Negative.       Allergies  Codeine; Demerol; Morphine and related; Other; and Sulfate  Home Medications   Current Outpatient Rx  Name  Route  Sig  Dispense  Refill  . acetaminophen (TYLENOL) 325 MG tablet   Oral   Take 1-2 tablets (325-650 mg total) by mouth every 6 (six) hours as needed.   90 tablet   0   . ADVAIR DISKUS 250-50 MCG/DOSE AEPB      INHALE ONE  PUFF BY MOUTH TWICE DAILY   60 each   6   . albuterol (PROVENTIL) (2.5 MG/3ML) 0.083% nebulizer solution   Nebulization   Take 2.5 mg by nebulization every 6 (six) hours as needed for wheezing or shortness of breath.         Marland Kitchen amitriptyline (ELAVIL) 10 MG tablet   Oral   Take 10 mg by mouth 2 (two) times daily.          Marland Kitchen aspirin 81 MG tablet   Oral   Take 81 mg by mouth daily.          . cholecalciferol (VITAMIN D) 1000 UNITS tablet   Oral   Take 5 tablets (5,000 Units total) by mouth daily.         Marland Kitchen losartan-hydrochlorothiazide (HYZAAR) 100-25 MG per tablet   Oral   Take 1 tablet by mouth daily.          . Multiple Vitamin (MULTIVITAMIN) capsule   Oral   Take 1 capsule by mouth daily.          Marland Kitchen omeprazole (PRILOSEC) 20 MG capsule   Oral   Take 20 mg by mouth 2 (two) times daily.          Marland Kitchen oxybutynin (DITROPAN) 5 MG tablet   Oral   Take 5 mg by mouth daily.          . pravastatin (PRAVACHOL) 40 MG tablet      TAKE ONE TABLET BY MOUTH EVERY DAY FOR CHOLESTEROL   90 tablet   1   . predniSONE (DELTASONE) 5 MG tablet   Oral   Take 10 mg by mouth daily.         . traMADol (ULTRAM) 50 MG tablet   Oral   Take 50 mg by mouth 2 (two) times daily as needed. For coughing         . benzonatate (TESSALON) 200 MG capsule   Oral   Take 1 capsule (200 mg total) by mouth 3 (three) times daily as needed for cough.   21 capsule   0   . predniSONE (DELTASONE) 20 MG tablet   Oral   Take 2 tablets (40 mg total) by mouth daily.   8 tablet   0    BP 135/55  Pulse 99  Temp(Src) 97.8 F (36.6 C) (Oral)  Resp 22  Ht 5\' 4"  (1.626 m)  Wt 160 lb (72.576 kg)  BMI  27.45 kg/m2  SpO2 90% Physical Exam  Nursing note and vitals reviewed. Constitutional: She appears well-developed and well-nourished. No distress.  HENT:  Head: Normocephalic and atraumatic.  Eyes: Conjunctivae are normal.  Neck: Normal range of motion.  Cardiovascular: Normal rate,  regular rhythm, normal heart sounds and intact distal pulses.   Borderline tachycardic, pt just completed duoneb.  Pulmonary/Chest: She has wheezes. She has no rhonchi. She has no rales. She exhibits no tenderness.  Wheezing with prolonged expiration throughout all lung fields.  Abdominal: Soft. Bowel sounds are normal. There is no tenderness.  Musculoskeletal: Normal range of motion. She exhibits no edema.  Neurological: She is alert.  Skin: Skin is warm and dry.  Psychiatric: She has a normal mood and affect.    ED Course  Procedures (including critical care time) Labs Review Labs Reviewed  CBC WITH DIFFERENTIAL - Abnormal; Notable for the following:    Hemoglobin 15.6 (*)    HCT 47.0 (*)    Eosinophils Relative 10 (*)    Eosinophils Absolute 0.9 (*)    All other components within normal limits  BASIC METABOLIC PANEL - Abnormal; Notable for the following:    Potassium 3.1 (*)    Glucose, Bld 134 (*)    GFR calc non Af Amer 54 (*)    GFR calc Af Amer 63 (*)    All other components within normal limits  PRO B NATRIURETIC PEPTIDE   Imaging Review No results found.  EKG Interpretation    Date/Time:  Tuesday December 05 2013 11:17:48 EST Ventricular Rate:  97 PR Interval:  138 QRS Duration: 76 QT Interval:  402 QTC Calculation: 510 R Axis:   -28 Text Interpretation:  Normal sinus rhythm Septal infarct , age undetermined Inferior infarct , age undetermined Prolonged QT Abnormal ECG Confirmed by Beau Fanny  MD, DOUGLAS ([id number]) on 12/05/2013 3:11:38 PM           Meds ordered this encounter  Medications  . albuterol (PROVENTIL) (2.5 MG/3ML) 0.083% nebulizer solution    Sig: Take 2.5 mg by nebulization every 6 (six) hours as needed for wheezing or shortness of breath.  Marland Kitchen albuterol (PROVENTIL,VENTOLIN) solution continuous neb    Sig:   . potassium chloride SA (K-DUR,KLOR-CON) CR tablet 40 mEq    Sig:   . benzonatate (TESSALON) capsule 200 mg    Sig:   . benzonatate  (TESSALON) 200 MG capsule    Sig: Take 1 capsule (200 mg total) by mouth 3 (three) times daily as needed for cough.    Dispense:  21 capsule    Refill:  0    Order Specific Question:  Supervising Provider    Answer:  Veryl Speak [[id number]]  . predniSONE (DELTASONE) 20 MG tablet    Sig: Take 2 tablets (40 mg total) by mouth daily.    Dispense:  8 tablet    Refill:  0    Order Specific Question:  Supervising Provider    Answer:  Veryl Speak [[id number]]   Pt received an additional continuous neb over one hour in the ed. Also given tessalon for cough relief, potassium supplemented due to decreased K+.   MDM   Final diagnoses:  COPD exacerbation    Pt with copd exacerbation currently on course of levaquin.  Her wheezing improved during the time she was observed in the ed.  She ambulated without increased wheezing, sob or other complaints, with her pulse ox remaining above 92%.  She felt better and was ready to go  home.  She was advised to finish her levaquin course,  Complete an albuterol neb every 4 hours.  Also advised to increase her prednisone ( currently chronically takes 5 mg bid) to 40 mg daily for the the next 4 days (starting tomorrow).  Advised to watch her cbg's while on this increased dose.  Pt and husband understands plan.  Return here or call her pcp for any worsened sx.  Pt was seen by Dr. Stark Jock during this visit.  The patient appears reasonably screened and/or stabilized for discharge and I doubt any other medical condition or other [facility] requiring further screening, evaluation, or treatment in the ED at this time prior to discharge.     Evalee Jefferson, PA-C 12/09/13 0900

## 2013-12-10 NOTE — ED Provider Notes (Signed)
Medical screening examination/treatment/procedure(s) were conducted as a shared visit with non-physician practitioner(s) and myself.  I personally evaluated the patient during the encounter.  Patient is a 78 year old female with history of COPD. She presents with a one-week history of cough and difficulty breathing. She was seen by her doctor 1 week ago and was started on Levaquin however is not improving. She is doing her nebulizers at home however this has not helped as well. She denies any chest pain.  On exam, vitals are stable and the patient is afebrile. She is somewhat hypoxic with oxygen saturations of 90%. Heart is regular rate and rhythm without murmurs. She has expiratory wheezes bilaterally. Extremities are without edema.  Workup reveals no significant abnormalities with her laboratory studies. Chest x-ray is without infiltrate or edema. She is feeling somewhat better with steroids by EMS and breathing treatments in the ER. This appears to be a flareup of COPD and will recommend a course of steroids, increased nebulizer treatments, and when necessary followup and return.  EKG Interpretation    Date/Time:  Tuesday December 05 2013 11:17:48 EST Ventricular Rate:  97 PR Interval:  138 QRS Duration: 76 QT Interval:  402 QTC Calculation: 510 R Axis:   -28 Text Interpretation:  Normal sinus rhythm Septal infarct , age undetermined Inferior infarct , age undetermined Prolonged QT Abnormal ECG Confirmed by Beau Fanny  MD, Asha Grumbine ([id number]) on 12/05/2013 3:11:38 PM             Veryl Speak, MD 12/10/13 0157

## 2013-12-12 ENCOUNTER — Other Ambulatory Visit: Payer: Self-pay | Admitting: Emergency Medicine

## 2013-12-13 ENCOUNTER — Ambulatory Visit (INDEPENDENT_AMBULATORY_CARE_PROVIDER_SITE_OTHER): Payer: Medicare Other | Admitting: Internal Medicine

## 2013-12-13 ENCOUNTER — Encounter: Payer: Self-pay | Admitting: Internal Medicine

## 2013-12-13 VITALS — BP 120/62 | HR 88 | Temp 99.5°F | Resp 18 | Wt 161.8 lb

## 2013-12-13 DIAGNOSIS — G589 Mononeuropathy, unspecified: Secondary | ICD-10-CM | POA: Diagnosis not present

## 2013-12-13 DIAGNOSIS — J209 Acute bronchitis, unspecified: Secondary | ICD-10-CM | POA: Diagnosis not present

## 2013-12-13 DIAGNOSIS — M1990 Unspecified osteoarthritis, unspecified site: Secondary | ICD-10-CM | POA: Diagnosis not present

## 2013-12-13 DIAGNOSIS — J42 Unspecified chronic bronchitis: Secondary | ICD-10-CM

## 2013-12-13 DIAGNOSIS — J45909 Unspecified asthma, uncomplicated: Secondary | ICD-10-CM | POA: Diagnosis not present

## 2013-12-13 DIAGNOSIS — G629 Polyneuropathy, unspecified: Secondary | ICD-10-CM

## 2013-12-13 DIAGNOSIS — I10 Essential (primary) hypertension: Secondary | ICD-10-CM

## 2013-12-13 DIAGNOSIS — R2689 Other abnormalities of gait and mobility: Secondary | ICD-10-CM

## 2013-12-13 DIAGNOSIS — H9190 Unspecified hearing loss, unspecified ear: Secondary | ICD-10-CM

## 2013-12-13 NOTE — Patient Instructions (Signed)

## 2013-12-13 NOTE — Progress Notes (Signed)
   Subjective:    Patient ID: Marie [name], female    DOB: Jul 03, 1936, 78 y.o.   MRN: [id number]  Cough This is a recurrent problem. The current episode started more than 1 month ago. The problem has been waxing and waning. The cough is productive of sputum. Associated symptoms include headaches, shortness of breath and wheezing. Pertinent negatives include no chest pain, chills, fever, heartburn, hemoptysis, myalgias, nasal congestion, postnasal drip, rash, rhinorrhea, sore throat or sweats. The symptoms are aggravated by dust, fumes and pollens. She has tried steroid inhaler for the symptoms. The treatment provided no relief. Her past medical history is significant for bronchitis and COPD.   she returns today in follow up after being seen about 2 weeks ago and started on Levaquin for 2 weeks with a refill. Previously she had been treated 3 X with a Z Pak over the preceding 3 months feeling that she never completely cleared her bronchitis. She did go to [facility] ER 1 week ago and had several nebulizer treatments for a Dx of AECB and was released and advised to continue her Levaquin and schedule office follow up. Now she feels improved but still no completely cleared with persist ant cough occasionally productive of scant amounts of sputum.    Review of Systems  Constitutional: Positive for fatigue. Negative for fever, chills and diaphoresis.  HENT: Positive for congestion. Negative for postnasal drip, rhinorrhea, sinus pressure and sore throat.   Eyes: Negative.   Respiratory: Positive for cough, chest tightness, shortness of breath and wheezing. Negative for hemoptysis and stridor.   Cardiovascular: Negative for chest pain, palpitations and leg swelling.  Gastrointestinal: Negative.  Negative for heartburn.  Genitourinary: Negative.   Musculoskeletal: Negative for myalgias.  Skin: Negative for rash.  Neurological: Positive for weakness and headaches. Negative for dizziness, tremors,  syncope, light-headedness and numbness.   Objective:   Physical Exam  Constitutional: She is oriented to person, place, and time. She appears well-nourished. No distress.  HENT:  Mouth/Throat: No oropharyngeal exudate.  Eyes: Conjunctivae and EOM are normal. Pupils are equal, round, and reactive to light. Right eye exhibits no discharge. Left eye exhibits no discharge.  Neck: Normal range of motion. Neck supple. No JVD present. No thyromegaly present.  Cardiovascular: Normal rate, regular rhythm, normal heart sounds and intact distal pulses.   No murmur heard. Pulmonary/Chest: No respiratory distress. She has wheezes. She has rales.  Abdominal: Soft. Bowel sounds are normal. There is no tenderness.  Musculoskeletal: Normal range of motion.  Lymphadenopathy:    She has no cervical adenopathy.  Neurological: She is alert and oriented to person, place, and time. No cranial nerve deficit.  Skin: Skin is warm and dry. No rash noted. She is not diaphoretic. No erythema.  Psychiatric: She has a normal mood and affect.   Assessment & Plan:  1. Acute bronchitis Advised to continue her Levaquin RF to a total of 30 day along with using her HHN qid (which she had not been doing except prn)  2. Intrinsic asthma, unspecified  Recc ROV 2 weeks for reassessment.

## 2013-12-18 ENCOUNTER — Other Ambulatory Visit: Payer: Self-pay | Admitting: Emergency Medicine

## 2013-12-21 DIAGNOSIS — K620 Anal polyp: Secondary | ICD-10-CM | POA: Diagnosis not present

## 2013-12-21 DIAGNOSIS — K621 Rectal polyp: Secondary | ICD-10-CM | POA: Diagnosis not present

## 2013-12-26 DIAGNOSIS — M2040 Other hammer toe(s) (acquired), unspecified foot: Secondary | ICD-10-CM | POA: Diagnosis not present

## 2013-12-26 DIAGNOSIS — B351 Tinea unguium: Secondary | ICD-10-CM | POA: Diagnosis not present

## 2013-12-26 DIAGNOSIS — M2010 Hallux valgus (acquired), unspecified foot: Secondary | ICD-10-CM | POA: Diagnosis not present

## 2013-12-27 ENCOUNTER — Ambulatory Visit (INDEPENDENT_AMBULATORY_CARE_PROVIDER_SITE_OTHER): Payer: Medicare Other | Admitting: Physician Assistant

## 2013-12-27 ENCOUNTER — Encounter: Payer: Self-pay | Admitting: Physician Assistant

## 2013-12-27 VITALS — BP 102/60 | HR 76 | Temp 97.5°F | Resp 16 | Ht 64.0 in | Wt 163.0 lb

## 2013-12-27 DIAGNOSIS — Z0000 Encounter for general adult medical examination without abnormal findings: Secondary | ICD-10-CM | POA: Diagnosis not present

## 2013-12-27 DIAGNOSIS — Z79899 Other long term (current) drug therapy: Secondary | ICD-10-CM

## 2013-12-27 DIAGNOSIS — E876 Hypokalemia: Secondary | ICD-10-CM

## 2013-12-27 DIAGNOSIS — Z1331 Encounter for screening for depression: Secondary | ICD-10-CM

## 2013-12-27 DIAGNOSIS — E2839 Other primary ovarian failure: Secondary | ICD-10-CM

## 2013-12-27 LAB — CBC WITH DIFFERENTIAL/PLATELET
BASOS ABS: 0.1 10*3/uL (ref 0.0–0.1)
BASOS PCT: 1 % (ref 0–1)
EOS PCT: 4 % (ref 0–5)
Eosinophils Absolute: 0.3 10*3/uL (ref 0.0–0.7)
HCT: 45.3 % (ref 36.0–46.0)
Hemoglobin: 15 g/dL (ref 12.0–15.0)
LYMPHS PCT: 18 % (ref 12–46)
Lymphs Abs: 1.3 10*3/uL (ref 0.7–4.0)
MCH: 29.9 pg (ref 26.0–34.0)
MCHC: 33.1 g/dL (ref 30.0–36.0)
MCV: 90.2 fL (ref 78.0–100.0)
Monocytes Absolute: 0.4 10*3/uL (ref 0.1–1.0)
Monocytes Relative: 6 % (ref 3–12)
Neutro Abs: 5 10*3/uL (ref 1.7–7.7)
Neutrophils Relative %: 71 % (ref 43–77)
PLATELETS: 266 10*3/uL (ref 150–400)
RBC: 5.02 MIL/uL (ref 3.87–5.11)
RDW: 13.6 % (ref 11.5–15.5)
WBC: 7 10*3/uL (ref 4.0–10.5)

## 2013-12-27 LAB — BASIC METABOLIC PANEL WITH GFR
BUN: 21 mg/dL (ref 6–23)
CHLORIDE: 104 meq/L (ref 96–112)
CO2: 30 mEq/L (ref 19–32)
Calcium: 8.7 mg/dL (ref 8.4–10.5)
Creat: 0.99 mg/dL (ref 0.50–1.10)
GFR, EST AFRICAN AMERICAN: 64 mL/min
GFR, EST NON AFRICAN AMERICAN: 56 mL/min — AB
Glucose, Bld: 105 mg/dL — ABNORMAL HIGH (ref 70–99)
Potassium: 3.8 mEq/L (ref 3.5–5.3)
Sodium: 143 mEq/L (ref 135–145)

## 2013-12-27 LAB — MAGNESIUM: MAGNESIUM: 2 mg/dL (ref 1.5–2.5)

## 2013-12-27 MED ORDER — PANTOPRAZOLE SODIUM 40 MG PO TBEC: 40.0000 mg | DELAYED_RELEASE_TABLET | Freq: Every day | ORAL | Status: DC

## 2013-12-27 MED ORDER — PREDNISONE 10 MG PO TABS: 10.0000 mg | ORAL_TABLET | Freq: Every day | ORAL | Status: DC

## 2013-12-27 NOTE — Progress Notes (Signed)
Subjective:   Marie [name] is a 78 y.o. female who presents for Medicare Annual Wellness Visit and 3 month follow up on hypertension, prediabetes, hyperlipidemia, vitamin D def.  Date of last medicare wellness visit is unknown.   Her blood pressure has been controlled at home, today their BP is BP: 102/60 mmHg She denies chest pain, shortness of breath, dizziness.  Her breathing is doing better, she has two more levaquin until she finishes. She is complaining of GERD today, has had nexium in past that works but too expensive.   Names of Other Physician/Practitioners you currently use: 1. [facility]- here for primary care 2. Dr. Katy Fitch switching to Dr. In Linna Hoff, eye doctor, last visit 01/2012 3. Dr. Harrington Challenger, dentist, last visit 10/2013 4. Dr. Jannifer Franklin 5. Dr. Amedeo Plenty 6. Dr. Lucia Gaskins Patient Care Team: [name], MD as PCP - General (Internal Medicine)   Medication Review Current Outpatient Prescriptions on File Prior to Visit  Medication Sig Dispense Refill  . acetaminophen (TYLENOL) 325 MG tablet Take 1-2 tablets (325-650 mg total) by mouth every 6 (six) hours as needed.  90 tablet  0  . ADVAIR DISKUS 250-50 MCG/DOSE AEPB INHALE ONE PUFF BY MOUTH TWICE DAILY  60 each  6  . albuterol (PROVENTIL) (2.5 MG/3ML) 0.083% nebulizer solution Take 2.5 mg by nebulization every 6 (six) hours as needed for wheezing or shortness of breath.      Marland Kitchen amitriptyline (ELAVIL) 10 MG tablet Take 10 mg by mouth 2 (two) times daily.       Marland Kitchen aspirin 81 MG tablet Take 81 mg by mouth daily.       . benzonatate (TESSALON) 200 MG capsule Take 1 capsule (200 mg total) by mouth 3 (three) times daily as needed for cough.  21 capsule  0  . cholecalciferol (VITAMIN D) 1000 UNITS tablet Take 5 tablets (5,000 Units total) by mouth daily.      Marland Kitchen losartan-hydrochlorothiazide (HYZAAR) 100-25 MG per tablet Take 1 tablet by mouth daily.       . Multiple Vitamin (MULTIVITAMIN)  capsule Take 1 capsule by mouth daily.       Marland Kitchen omeprazole (PRILOSEC) 20 MG capsule Take 20 mg by mouth 2 (two) times daily.       Marland Kitchen oxybutynin (DITROPAN) 5 MG tablet Take 5 mg by mouth daily.       . pravastatin (PRAVACHOL) 40 MG tablet TAKE ONE TABLET BY MOUTH EVERY DAY FOR CHOLESTEROL  90 tablet  1  . predniSONE (DELTASONE) 10 MG tablet Take 10 mg by mouth daily with breakfast.      . traMADol (ULTRAM) 50 MG tablet Take 50 mg by mouth 2 (two) times daily as needed. For coughing       No current facility-administered medications on file prior to visit.    Current Problems (verified) Patient Active Problem List   Diagnosis Date Noted  . CAP (community acquired pneumonia) 04/20/2013  . UTI (lower urinary tract infection) 10/27/2012  . Frequent falls 10/25/2012  . Incontinence   . Osteoarthritis   . Hyperlipidemia   . Chronic bronchitis   . GERD (gastroesophageal reflux disease)   . Neuropathy   . Hypertension   . Balance disorder   . Hearing loss   . Iatrogenic adrenal insufficiency   . 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 Zostavax  06/25/1998  . Influenza Vaccine  05/19/2013  . Mammogram  05/02/2015  . Colonoscopy  03/19/2021  . Tetanus/tdap  08/18/2023  . Pneumococcal Polysaccharide Vaccine Age 92 And Over  Completed    Health Maintenance Topics with due status: Overdue     Topic Date Due   ZOSTAVAX 06/25/1998   INFLUENZA VACCINE 05/19/2013    Immunization History  Administered Date(s) Administered  . Pneumococcal-Unspecified 07/20/2003, 08/17/2013  . Td 08/17/2013  . Tdap 10/25/2012    Preventative care: Last colonoscopy: 03/2011 Last mammogram: 04/2013 Last pap smear/pelvic exam: remote DEXA: years  Prior vaccinations: TD or Tdap: 2014  Influenza: 06/2013  Pneumococcal: 2014 Shingles/Zostavax: too expensive  History reviewed: allergies, current medications, past family history, past  medical history, past social history, past surgical history and problem list  Risk Factors: Osteoporosis: postmenopausal estrogen deficiency, dietary calcium and/or vitamin D deficiency, amenorrhea and prednisone use History of fracture in the past year: no  Tobacco History  Smoking status  . Never Smoker   Smokeless tobacco  . Never Used   She does not smoke.  Patient is not a former smoker. Are there smokers in your home (other than you)?  No  Alcohol Current alcohol use: none  Caffeine Current caffeine use: denies use  Exercise Exercise limitations: The patient has exercise limitations, uses walkder at home Current exercise: none  Nutrition/Diet Current diet: in general, a "healthy" diet    Cardiac risk factors: advanced age (older than 73 for men, 76 for women), dyslipidemia, hypertension and sedentary lifestyle.  Depression Screen Nurse depression screen reviewed.  (Note: if answer to either of the following is "Yes", a more complete depression screening is indicated)   Q1: Over the past two weeks, have you felt down, depressed or hopeless? No  Q2: Over the past two weeks, have you felt little interest or pleasure in doing things? No  Have you lost interest or pleasure in daily life? No  Do you often feel hopeless? No  Do you cry easily over simple problems? No  Activities of Daily Living Nurse ADLs screen reviewed.  In your present state of health, do you have any difficulty performing the following activities?:  Driving? No Managing money?  No Feeding yourself? No Getting from bed to chair? No Climbing a flight of stairs? Yes Preparing food and eating?: No Bathing or showering? No Getting dressed: No Getting to the toilet? No Using the toilet:No Moving around from place to place: Yes In the past year have you fallen or had a near fall?:Yes   Are you sexually active?  No  Do you have more than one partner?  No  Vision Difficulties: Yes  Hearing  Difficulties: Yes Do you often ask people to speak up or repeat themselves? No Do you experience ringing or noises in your ears? No Do you have difficulty understanding soft or whispered voices? No  Cognition  Do you feel that you have a problem with memory? Yes  Do you often misplace items? No  Do you feel safe at home?  Yes  Advanced directives Does patient have a [place]? Yes Does patient have a Living Will? Yes    Objective:     Vision and hearing screens reviewed.   Blood pressure 102/60, pulse 76, temperature 97.5 F (36.4 C), resp. rate 16, height 5\' 4"  (1.626 m), weight 163 lb (73.936 kg). Body mass index is 27.97 kg/(m^2).  General appearance: alert, no distress, WD/WN,  female Cognitive Testing  Alert? Yes  Normal Appearance?Yes  Oriented to person? Yes  Place? Yes   Time? Yes  Recall of three objects?  Yes  Can perform simple calculations? Yes  Displays appropriate judgment?Yes  Can read the correct time from a watch face?Yes  HEENT: normocephalic, sclerae anicteric, TMs pearly, nares patent, no discharge or erythema, pharynx normal Oral cavity: MMM, no lesions Neck: supple, no lymphadenopathy, no thyromegaly, no masses Heart: RRR, normal S1, S2, no murmurs Lungs: CTA bilaterally, no wheezes, rhonchi, or rales Abdomen: +bs, soft, non tender, non distended, no masses, no hepatomegaly, no splenomegaly Musculoskeletal: nontender, no swelling, no obvious deformity Extremities: no edema, no cyanosis, no clubbing Pulses: 2+ symmetric, upper and lower extremities, normal cap refill Neurological: alert, oriented x 3, CN2-12 intact, strength normal upper extremities and lower extremities, sensation normal throughout, DTRs 2+ throughout, no cerebellar signs, gait normal Psychiatric: normal affect, behavior normal, pleasant  Breast: nontender, no masses or lumps, no skin changes, no nipple discharge or inversion, no axillary lymphadenopathy Gyn:  Normal external genitalia without lesions, vagina with normal mucosa, cervix without lesions, no cervical motion tenderness, no abnormal vaginal discharge.  Uterus and adnexa not enlarged, nontender, no masses.  Pap performed.   Rectal:    Assessment:   Hypokalemia- taking 84meq TID- will recheck BMP Cough- SOB better, lungs CTAB, ? GERD switch to protonix.   Plan:   During the course of the visit the patient was educated and counseled about appropriate screening and preventive services including:    Influenza vaccine  Td vaccine  Screening electrocardiogram  Screening mammography  Bone densitometry screening  Colorectal cancer screening  Diabetes screening  Glaucoma screening  Nutrition counseling   Screening recommendations, referrals:  Vaccinations: Tdap vaccine not done  Influenza vaccine not done Pneumococcal vaccine not done Shingles vaccine not done Hep B vaccine not done  Nutrition assessed and recommended  Colonoscopy yes Mammogram yes Pap smear no Pelvic exam no Recommended yearly ophthalmology/optometry visit for glaucoma screening and checkup Recommended yearly dental visit for hygiene and checkup Advanced directives - not done  Conditions/risks identified: BMI: Discussed weight loss, diet, and increase physical activity.  Increase physical activity: AHA recommends 150 minutes of physical activity a week.  Medications reviewed DEXA- ordered Urinary Incontinence is an issue: discussed non pharmacology and pharmacology options.  Fall risk: high- discussed PT, home fall assessment, medications.   Medicare Attestation I have personally reviewed: The patient's medical and social history Their use of alcohol, tobacco or illicit drugs Their current medications and supplements The patient's functional ability including ADLs,fall risks, home safety risks, cognitive, and hearing and visual impairment Diet and physical activities Evidence for  depression or mood disorders  The patient's weight, height, BMI, and visual acuity have been recorded in the chart.  I have made referrals, counseling, and provided education to the patient based on review of the above and I have provided the patient with a written personalized care plan for preventive services.     Vicie Mutters, PA-C   12/27/2013

## 2013-12-27 NOTE — Patient Instructions (Signed)
Can stop omeprazole and start protonix 40mg  once daily 30 mins before food. Can also take prevacid or zegrid at night if needed.   Diet for Gastroesophageal Reflux Disease, Adult Reflux (acid reflux) is when acid from your stomach flows up into the esophagus. When acid comes in contact with the esophagus, the acid causes irritation and soreness (inflammation) in the esophagus. When reflux happens often or so severely that it causes damage to the esophagus, it is called gastroesophageal reflux disease (GERD). Nutrition therapy can help ease the discomfort of GERD. FOODS OR DRINKS TO AVOID OR LIMIT  Smoking or chewing tobacco. Nicotine is one of the most potent stimulants to acid production in the gastrointestinal tract.  Caffeinated and decaffeinated coffee and black tea.  Regular or low-calorie carbonated beverages or energy drinks (caffeine-free carbonated beverages are allowed).   Strong spices, such as black pepper, white pepper, red pepper, cayenne, curry powder, and chili powder.  Peppermint or spearmint.  Chocolate.  High-fat foods, including meats and fried foods. Extra added fats including oils, butter, salad dressings, and nuts. Limit these to less than 8 tsp per day.  Fruits and vegetables if they are not tolerated, such as citrus fruits or tomatoes.  Alcohol.  Any food that seems to aggravate your condition. If you have questions regarding your diet, call your caregiver or a registered dietitian. OTHER THINGS THAT MAY HELP GERD INCLUDE:   Eating your meals slowly, in a relaxed setting.  Eating 5 to 6 small meals per day instead of 3 large meals.  Eliminating food for a period of time if it causes distress.  Not lying down until 3 hours after eating a meal.  Keeping the head of your bed raised 6 to 9 inches (15 to 23 cm) by using a foam wedge or blocks under the legs of the bed. Lying flat may make symptoms worse.  Being physically active. Weight loss may be helpful  in reducing reflux in overweight or obese adults.  Wear loose fitting clothing Chronic Obstructive Pulmonary Disease Chronic obstructive pulmonary disease (COPD) is a common lung problem. In COPD, the flow of air from the lungs is limited. The way your lungs work will probably never return to normal, but there are things you can do to improve you lungs and make yourself feel better. HOME CARE  Take all medicines as told by your doctor.  Only take over-the-counter or prescription medicines as told by your doctor.  Avoid medicines or cough syrups that dry up your airway (such as antihistamines) and do not allow you to get rid of thick spit. You do not need to avoid them if told differently by your doctor.  If you smoke, stop. Smoking makes the problem worse.  Avoid being around things that make your breathing worse (like smoke, chemicals, and fumes).  Use oxygen therapy and therapy to help improve your lungs (pulmonary rehabilitation) if told by your doctor. If you need home oxygen therapy, ask your doctor if you should buy a tool to measure your oxygen level (oximeter).  Avoid people who have a sickness you can catch (contagious).  Avoid going outside when it is very hot, cold, or humid.  Eat healthy foods. Eat smaller meals more often. Rest before meals.  Stay active, but remember to also rest.  Make sure to get all the shots (vaccines) your doctor recommends. Ask your doctor if you need a pneumonia shot.  Learn and use tips on how to relax.  Learn and use tips  on how to control your breathing as told by your doctor. Try:  Breathing in (inhaling) through your nose for 1 second. Then, pucker your lips and breath out (exhale) through your lips for 2 seconds.  Putting one hand on your belly (abdomen). Breathe in slowly through your nose for 1 second. Your hand on your belly should move out. Pucker your lips and breathe out slowly through your lips. Your hand on your belly should move  in as you breathe out.  Learn and use controlled coughing to clear thick spit from your lungs. 1. Lean your head a little forward. 2. Breathe in deeply. 3. Try to hold your breath for 3 seconds. 4. Keep your mouth slightly open while coughing 2 times. 5. Spit any thick spit out into a tissue. 6. Rest and do the steps again 1 or 2 times as needed. GET HELP IF:  You cough up more thick spit than usual.  There is a change in the color or thickness of the spit.  It is harder to breathe than usual.  Your breathing is faster than usual. GET HELP RIGHT AWAY IF:   You have shortness of breath while resting.  You have shortness of breath that stops you from:  Being able to talk.  Doing normal activities.  You chest hurts for longer than 5 minutes.  Your skin color is more blue than usual.  Your pulse oximeter shows that you have low oxygen for longer than 5 minutes. MAKE SURE YOU:   Understand these instructions.  Will watch your condition.  Will get help right away if you are not doing well or get worse. Document Released: 03/23/2008 Document Revised: 07/26/2013 Document Reviewed: 06/01/2013 [facility] Patient Information 2014 [place], [place].

## 2014-01-15 ENCOUNTER — Encounter: Payer: Self-pay | Admitting: Physician Assistant

## 2014-01-15 ENCOUNTER — Ambulatory Visit (HOSPITAL_COMMUNITY)
Admission: RE | Admit: 2014-01-15 | Discharge: 2014-01-15 | Disposition: A | Discharge status: Home / Self Care | Payer: Medicare Other | Source: Ambulatory Visit | Attending: Physician Assistant | Admitting: Physician Assistant

## 2014-01-15 ENCOUNTER — Ambulatory Visit (INDEPENDENT_AMBULATORY_CARE_PROVIDER_SITE_OTHER): Payer: Medicare Other | Admitting: Physician Assistant

## 2014-01-15 VITALS — BP 122/68 | HR 80 | Temp 98.1°F | Resp 16 | Wt 166.0 lb

## 2014-01-15 DIAGNOSIS — R635 Abnormal weight gain: Secondary | ICD-10-CM

## 2014-01-15 DIAGNOSIS — J440 Chronic obstructive pulmonary disease with acute lower respiratory infection: Secondary | ICD-10-CM

## 2014-01-15 DIAGNOSIS — R0600 Dyspnea, unspecified: Secondary | ICD-10-CM

## 2014-01-15 DIAGNOSIS — R0989 Other specified symptoms and signs involving the circulatory and respiratory systems: Secondary | ICD-10-CM

## 2014-01-15 DIAGNOSIS — R0609 Other forms of dyspnea: Secondary | ICD-10-CM | POA: Diagnosis not present

## 2014-01-15 LAB — BASIC METABOLIC PANEL WITH GFR
BUN: 22 mg/dL (ref 6–23)
CO2: 32 mEq/L (ref 19–32)
Calcium: 9 mg/dL (ref 8.4–10.5)
Chloride: 101 mEq/L (ref 96–112)
Creat: 0.93 mg/dL (ref 0.50–1.10)
GFR, EST AFRICAN AMERICAN: 70 mL/min
GFR, Est Non African American: 60 mL/min
Glucose, Bld: 97 mg/dL (ref 70–99)
Potassium: 3.8 mEq/L (ref 3.5–5.3)
SODIUM: 142 meq/L (ref 135–145)

## 2014-01-15 LAB — HEPATIC FUNCTION PANEL
ALT: 17 U/L (ref 0–35)
AST: 18 U/L (ref 0–37)
Albumin: 3.7 g/dL (ref 3.5–5.2)
Alkaline Phosphatase: 43 U/L (ref 39–117)
BILIRUBIN DIRECT: 0.1 mg/dL (ref 0.0–0.3)
Indirect Bilirubin: 0.4 mg/dL (ref 0.2–1.2)
Total Bilirubin: 0.5 mg/dL (ref 0.2–1.2)
Total Protein: 6 g/dL (ref 6.0–8.3)

## 2014-01-15 LAB — MAGNESIUM: MAGNESIUM: 2.1 mg/dL (ref 1.5–2.5)

## 2014-01-15 LAB — CBC WITH DIFFERENTIAL/PLATELET
BASOS PCT: 1 % (ref 0–1)
Basophils Absolute: 0.1 10*3/uL (ref 0.0–0.1)
Eosinophils Absolute: 0.3 10*3/uL (ref 0.0–0.7)
Eosinophils Relative: 4 % (ref 0–5)
HCT: 45.1 % (ref 36.0–46.0)
HEMOGLOBIN: 15.3 g/dL — AB (ref 12.0–15.0)
Lymphocytes Relative: 16 % (ref 12–46)
Lymphs Abs: 1.3 10*3/uL (ref 0.7–4.0)
MCH: 30.7 pg (ref 26.0–34.0)
MCHC: 33.9 g/dL (ref 30.0–36.0)
MCV: 90.4 fL (ref 78.0–100.0)
MONOS PCT: 4 % (ref 3–12)
Monocytes Absolute: 0.3 10*3/uL (ref 0.1–1.0)
NEUTROS ABS: 6.3 10*3/uL (ref 1.7–7.7)
NEUTROS PCT: 75 % (ref 43–77)
PLATELETS: 279 10*3/uL (ref 150–400)
RBC: 4.99 MIL/uL (ref 3.87–5.11)
RDW: 13.7 % (ref 11.5–15.5)
WBC: 8.4 10*3/uL (ref 4.0–10.5)

## 2014-01-15 MED ORDER — ALBUTEROL SULFATE (2.5 MG/3ML) 0.083% IN NEBU: 2.5000 mg | INHALATION_SOLUTION | Freq: Four times a day (QID) | RESPIRATORY_TRACT | Status: DC | PRN

## 2014-01-15 MED ORDER — DOXYCYCLINE HYCLATE 100 MG PO TABS: 100.0000 mg | ORAL_TABLET | Freq: Two times a day (BID) | ORAL | Status: DC

## 2014-01-15 MED ORDER — FUROSEMIDE 40 MG PO TABS: 40.0000 mg | ORAL_TABLET | Freq: Every day | ORAL | Status: DC

## 2014-01-15 NOTE — Progress Notes (Signed)
   Subjective:    Patient ID: Marie [name], female    DOB: 10-05-1936, 78 y.o.   MRN: [id number]  HPI Patient was seen in  Feb for possible pneumonia/bronchitis, she was given levaquin and at her last visit 03/11 had two ABX pills left, she was doing better at that time except a continuing cough. She states the protonix does not help her like the omeprazole. Currently She is coughing, wheezing, chest feels full, for one week. Her weight is up 5 lbs from Feb and she has some mild edema. She sleeps with her bed elevated normally, denies PND. Denies fever, chills.   Wt Readings from Last 3 Encounters:  01/15/14 166 lb (75.297 kg)  12/27/13 163 lb (73.936 kg)  12/13/13 161 lb 12.8 oz (73.392 kg)    Review of Systems  Constitutional: Negative.   HENT: Negative.   Respiratory: Positive for cough and wheezing. Negative for choking, chest tightness and shortness of breath.   Cardiovascular: Positive for leg swelling. Negative for chest pain and palpitations.  Gastrointestinal: Negative.   Genitourinary: Negative.   Neurological: Negative.        Objective:   Physical Exam  Constitutional: She is oriented to person, place, and time. She appears well-developed and well-nourished.  HENT:  Head: Normocephalic and atraumatic.  Neck: Normal range of motion. Neck supple. No JVD present.  Cardiovascular: Normal rate and regular rhythm.   Pulmonary/Chest: Effort normal. No respiratory distress. She has wheezes. She has rales (LLL).  Abdominal: Soft. Bowel sounds are normal. She exhibits no distension. There is no tenderness.  Musculoskeletal: She exhibits edema (1+ edema).  Neurological: She is alert and oriented to person, place, and time.  Skin: Skin is warm and dry.       Assessment & Plan:  Dyspnea with cough with thin sputum and leg swelling- CHF, infection, COPD- Get CXR and labs. Give ABX, she was just on levaquin, we will do Doxycycline , and CHF recommendations including putting her  on Lasix 40mg  1 pill in the morning for 3 days and then 1/2 pill daily, will add kdur 58meq BID.  Follow up in 1 week. If any increasing shortness of breath, swelling, or chest pressure go to ER immediately.

## 2014-01-15 NOTE — Patient Instructions (Signed)
We will know more after the chest x ray.  Please take the lasix 40mg  once daily for 2-3 days and take a potassium pill with it.  After the 2-3 days you can go to 1/2 pill daily.   Do the following things EVERYDAY: 1) Weigh yourself in the morning before breakfast. Write it down and keep it in a log. 2) Take your medicines as prescribed 3) Eat low salt foods-Limit salt (sodium) to 2000 mg per day.  4) Stay as active as you can everyday 5) Limit all fluids for the day to less than 2 liters   Shortness of Breath Shortness of breath means you have trouble breathing. Shortness of breath may indicate that you have a medical problem. You should seek immediate medical care for shortness of breath. CAUSES   Not enough oxygen in the air (as with high altitudes or a smoke-filled room).  Short-term (acute) lung disease, including:  Infections, such as pneumonia.  Fluid in the lungs, such as heart failure.  A blood clot in the lungs (pulmonary embolism).  Long-term (chronic) lung diseases.  Heart disease (heart attack, angina, heart failure, and others).  Low red blood cells (anemia).  Poor physical fitness. This can cause shortness of breath when you exercise.  Chest or back injuries or stiffness.  Being overweight.  Smoking.  Anxiety. This can make you feel like you are not getting enough air. DIAGNOSIS  Serious medical problems can usually be found during your physical exam. Tests may also be done to determine why you are having shortness of breath. Tests may include:  Chest X-rays.  Lung function tests.  Blood tests.  Electrocardiography.  Exercise testing.  Echocardiography.  Imaging scans. Your caregiver may not be able to find a cause for your shortness of breath after your exam. In this case, it is important to have a follow-up exam with your caregiver as directed.  TREATMENT  Treatment for shortness of breath depends on the cause of your symptoms and can vary  greatly. HOME CARE INSTRUCTIONS   Do not smoke. Smoking is a common cause of shortness of breath. If you smoke, ask for help to quit.  Avoid being around chemicals or things that may bother your breathing, such as paint fumes and dust.  Rest as needed. Slowly resume your usual activities.  If medicines were prescribed, take them as directed for the full length of time directed. This includes oxygen and any inhaled medicines.  Keep all follow-up appointments as directed by your caregiver. SEEK MEDICAL CARE IF:   Your condition does not improve in the time expected.  You have a hard time doing your normal activities even with rest.  You have any side effects or problems with the medicines prescribed.  You develop any new symptoms. SEEK IMMEDIATE MEDICAL CARE IF:   Your shortness of breath gets worse.  You feel lightheaded, faint, or develop a cough not controlled with medicines.  You start coughing up blood.  You have pain with breathing.  You have chest pain or pain in your arms, shoulders, or abdomen.  You have a fever.  You are unable to walk up stairs or exercise the way you normally do. MAKE SURE YOU:  Understand these instructions.  Will watch your condition.  Will get help right away if you are not doing well or get worse. Document Released: 06/30/2001 Document Revised: 04/05/2012 Document Reviewed: 12/21/2011 [facility] Patient Information 2014 [place].

## 2014-01-16 LAB — BRAIN NATRIURETIC PEPTIDE: Brain Natriuretic Peptide: 33 pg/mL (ref 0.0–100.0)

## 2014-01-23 ENCOUNTER — Ambulatory Visit: Payer: Self-pay | Admitting: Internal Medicine

## 2014-01-30 ENCOUNTER — Ambulatory Visit (INDEPENDENT_AMBULATORY_CARE_PROVIDER_SITE_OTHER): Payer: Medicare Other | Admitting: Internal Medicine

## 2014-01-30 ENCOUNTER — Encounter: Payer: Self-pay | Admitting: Internal Medicine

## 2014-01-30 VITALS — BP 124/66 | HR 76 | Temp 97.5°F | Resp 18 | Ht 64.0 in | Wt 165.2 lb

## 2014-01-30 DIAGNOSIS — I10 Essential (primary) hypertension: Secondary | ICD-10-CM

## 2014-01-30 DIAGNOSIS — J449 Chronic obstructive pulmonary disease, unspecified: Secondary | ICD-10-CM | POA: Diagnosis not present

## 2014-01-30 DIAGNOSIS — R269 Unspecified abnormalities of gait and mobility: Secondary | ICD-10-CM | POA: Diagnosis not present

## 2014-01-30 DIAGNOSIS — J42 Unspecified chronic bronchitis: Secondary | ICD-10-CM

## 2014-01-30 NOTE — Progress Notes (Signed)
Subjective:    Patient ID: Marie [name], female    DOB: 02/14/1936, 78 y.o.   MRN: [id number]  HPI Very nice 78 yo MWF with HTN, HLD, PreDM, DJD, GERD, Gait Disorder, Vit D Def and Steroid dependent COLD presents for 2 week F/U after TX with Doxycycline which also followed TX with Levaquin. Pt reports moderate improvement with no more productive sputum. She still reports DOE .    Medication Sig  . acetaminophen (TYLENOL) 325 MG tablet Take 1-2 tablets  by mouth every 6  hours prn  . ADVAIR DISKUS 250-50 MCG/DOSE AEPB INHALE ONE PUFF BY MOUTH TWICE DAILY  . albuterol (PROVENTIL) (2.5 MG/3ML) 0.083% nebulizer  Take 3 mLs (2.5 mg total) every 6  hours as needed   . amitriptyline (ELAVIL) 10 MG tablet Take 10 mg by mouth 2 (two) times daily.   Marland Kitchen aspirin 81 MG tablet Take 81 mg by mouth daily.   . benzonatate (TESSALON) 200 MG capsule Take 1 capsule  by mouth 3  times daily as needed for cough.  . cholecalciferol (VITAMIN D) 1000 UNITS tablet Take 5 tablets (5,000 Units total) by mouth daily.  Marland Kitchen doxycycline (VIBRA-TABS) 100 MG tablet Take 1 tablet (100 mg total) by mouth 2 (two) times daily.  . furosemide (LASIX) 40 MG tablet Take 1 tablet (40 mg total) by mouth daily.  Marland Kitchen losartan-hydrochlorothiazide (HYZAAR) 100-25 MG  Take 1 tablet by mouth daily.   . Multiple Vitamin (MULTIVITAMIN) capsule Take 1 capsule by mouth daily.   Marland Kitchen omeprazole (PRILOSEC) 20 MG capsule Take 20 mg by mouth 2 (two) times daily.   Marland Kitchen oxybutynin (DITROPAN) 5 MG tablet Take 5 mg by mouth daily.   . pantoprazole (PROTONIX) 40 MG tablet Take 1 tablet (40 mg total) by mouth daily.  . pravastatin (PRAVACHOL) 40 MG tablet TAKE ONE TABLET BY MOUTH EVERY DAY FOR CHOLESTEROL  . predniSONE (DELTASONE) 10 MG tablet Take 1 tablet (10 mg total) by mouth daily with breakfast.  . traMADol (ULTRAM) 50 MG tablet Take 50 mg by mouth 2 (two) times daily as needed. For coughing    Allergies  Allergen Reactions  . Codeine Other (See  Comments)    hallucinations  . Demerol Other (See Comments)    hallucinations  . Morphine And Related Other (See Comments)    unknown  . Other Other (See Comments)    Invan 7- pt became red all over  . Sulfate Other (See Comments)    unknown   Past Medical History  Diagnosis Date  . Incontinence     not indicated at this visit.  . Osteoarthritis     hands,   . Wears dentures   . Osteoporosis   . Bulging disc   . Hyperlipidemia   . Chronic bronchitis     due to congestion at times, on prednisone and advair  . GERD (gastroesophageal reflux disease)   . Neuropathy     legs stay numb   . Depression     many years ago  . Hypertension     followed by intern med Dr. Marisue Brooklyn [phone]  . Tubulovillous adenoma of rectum   . Balance disorder 2008.      Falls a lot.  She can be standing and then leans too far over to one side   . Wears glasses   . Hearing loss     mild  . Iatrogenic adrenal insufficiency    Review of Systems In addition to the HPI  above,  No Fever-chills,  No Headache, No changes with Vision or hearing,  No problems swallowing food or Liquids,  Respiratory as above.  No Abdominal pain, No Nausea or Vommitting, Bowel movements are regular,  No Blood in stool or Urine,  No dysuria,  No new skin rashes or bruises,  No new joints pains-aches,  No new weakness, tingling, numbness in any extremity,  No recent weight loss,  No polyuria, polydypsia or polyphagia,  No significant Mental Stressors.  A full 10 point Review of Systems was done, except as stated above, all other Review of Systems were negative  Objective:   Physical Exam  BP 124/66  Pulse 76  Temp 97.5 F   Resp 18  Ht 5\' 4"    Wt 165 lb 3.2 oz   BMI 28.34 kg/m2 HEENT - Eac's patent. TM's Nl.EOM's full. PERRLA. NasoOroPharynx clear. Neck - supple. Nl Thyroid. No bruits nodes JVD Chest - Clear equal but very distant BS Cor - Nl HS. RRR w/o sig MGR. PP 1(+) No edema. Abd - Benign MS- FROM.  w/o deformities. Muscle power tone and bulk Nl. Gait Nl. Neuro - No obvious Cr N abnormalities. Sensory, motor and Cerebellar functions appear Nl w/o focal abnormalities.  Assessment & Plan:   1. Hypertension   2. COPD   3. Chronic bronchitis  - Discussed meds and treatments

## 2014-01-30 NOTE — Patient Instructions (Signed)
Chronic Obstructive Pulmonary Disease Exacerbation  Chronic obstructive pulmonary disease (COPD) is a common lung problem. In COPD, the flow of air from the lungs is limited. COPD exacerbations are times that breathing gets worse and you need extra treatment. Without treatment they can be life threatening. If they happen often, your lungs can become more damaged. HOME CARE  Do not smoke.  Avoid tobacco smoke and other things that bother your lungs.  If given, take your antibiotic medicine as told. Finish the medicine even if you start to feel better.  Only take medicines as told by your doctor.  Drink enough fluids to keep your pee (urine) clear or pale yellow (unless your doctor has told you not to).  Use a cool mist machine (vaporizer).  If you use oxygen or a machine that turns liquid medicine into a mist (nebulizer), continue to use them as told.  Keep up with shots (vaccinations) as told by your doctor.  Exercise regularly.  Eat healthy foods.  Keep all doctor visits as told. GET HELP RIGHT AWAY IF:  You are very short of breath and it gets worse.  You have trouble talking.  You have bad chest pain.  You have blood in your spit (sputum).  You have a fever.  You keep throwing up (vomiting).  You feel weak, or you pass out (faint).  You feel confused.  You keep getting worse. MAKE SURE YOU:   Understand these instructions.  Will watch your condition.  Will get help right away if you are not doing well or get worse. Document Released: 09/24/2011 Document Revised: 07/26/2013 Document Reviewed: 06/09/2013 [employer] Patient Information 2014 [employer].  

## 2014-02-01 ENCOUNTER — Other Ambulatory Visit: Payer: Self-pay | Admitting: *Deleted

## 2014-02-01 MED ORDER — LOSARTAN POTASSIUM-HCTZ 100-25 MG PO TABS: 1.0000 | ORAL_TABLET | Freq: Every day | ORAL | Status: DC

## 2014-02-05 ENCOUNTER — Other Ambulatory Visit: Payer: Self-pay | Admitting: Physician Assistant

## 2014-02-05 MED ORDER — PANTOPRAZOLE SODIUM 40 MG PO TBEC: 40.0000 mg | DELAYED_RELEASE_TABLET | Freq: Two times a day (BID) | ORAL | Status: DC

## 2014-02-15 ENCOUNTER — Ambulatory Visit: Payer: Self-pay | Admitting: Internal Medicine

## 2014-02-15 DIAGNOSIS — Z85828 Personal history of other malignant neoplasm of skin: Secondary | ICD-10-CM | POA: Diagnosis not present

## 2014-02-15 DIAGNOSIS — L821 Other seborrheic keratosis: Secondary | ICD-10-CM | POA: Diagnosis not present

## 2014-02-15 DIAGNOSIS — L905 Scar conditions and fibrosis of skin: Secondary | ICD-10-CM | POA: Diagnosis not present

## 2014-02-20 ENCOUNTER — Telehealth: Payer: Self-pay | Admitting: *Deleted

## 2014-02-20 NOTE — Telephone Encounter (Signed)
REFILL= FLUOXTINE 40MG  QD #90    [facility] [place]

## 2014-02-20 NOTE — Telephone Encounter (Signed)
I do not see this in the chart. Can you get paper chart and check or can you make sure she was not talking about furosemide 40mg .

## 2014-02-21 ENCOUNTER — Other Ambulatory Visit: Payer: Self-pay | Admitting: Physician Assistant

## 2014-02-21 MED ORDER — FLUOXETINE HCL 40 MG PO CAPS
40.0000 mg | ORAL_CAPSULE | Freq: Every day | ORAL | Status: DC
Start: 2014-02-21 — End: 2014-06-01

## 2014-03-14 ENCOUNTER — Encounter: Payer: Self-pay | Admitting: Physician Assistant

## 2014-03-14 ENCOUNTER — Other Ambulatory Visit: Payer: Self-pay

## 2014-03-14 ENCOUNTER — Ambulatory Visit (INDEPENDENT_AMBULATORY_CARE_PROVIDER_SITE_OTHER): Payer: Medicare Other | Admitting: Physician Assistant

## 2014-03-14 ENCOUNTER — Ambulatory Visit (HOSPITAL_COMMUNITY)
Admission: RE | Admit: 2014-03-14 | Discharge: 2014-03-14 | Disposition: A | Discharge status: Home / Self Care | Payer: Medicare Other | Source: Ambulatory Visit | Attending: Physician Assistant | Admitting: Physician Assistant

## 2014-03-14 VITALS — BP 120/60 | HR 80 | Temp 98.2°F | Resp 16 | Ht 64.0 in | Wt 163.0 lb

## 2014-03-14 DIAGNOSIS — Z79899 Other long term (current) drug therapy: Secondary | ICD-10-CM | POA: Diagnosis not present

## 2014-03-14 DIAGNOSIS — N949 Unspecified condition associated with female genital organs and menstrual cycle: Secondary | ICD-10-CM | POA: Insufficient documentation

## 2014-03-14 DIAGNOSIS — N3000 Acute cystitis without hematuria: Secondary | ICD-10-CM

## 2014-03-14 DIAGNOSIS — M545 Low back pain, unspecified: Secondary | ICD-10-CM

## 2014-03-14 DIAGNOSIS — W19XXXA Unspecified fall, initial encounter: Secondary | ICD-10-CM

## 2014-03-14 DIAGNOSIS — IMO0002 Reserved for concepts with insufficient information to code with codable children: Secondary | ICD-10-CM | POA: Diagnosis not present

## 2014-03-14 DIAGNOSIS — R42 Dizziness and giddiness: Secondary | ICD-10-CM | POA: Diagnosis not present

## 2014-03-14 LAB — BASIC METABOLIC PANEL WITH GFR
BUN: 15 mg/dL (ref 6–23)
CO2: 29 mEq/L (ref 19–32)
Calcium: 9 mg/dL (ref 8.4–10.5)
Chloride: 99 mEq/L (ref 96–112)
Creat: 1.04 mg/dL (ref 0.50–1.10)
GFR, EST AFRICAN AMERICAN: 61 mL/min
GFR, EST NON AFRICAN AMERICAN: 53 mL/min — AB
GLUCOSE: 109 mg/dL — AB (ref 70–99)
POTASSIUM: 3.8 meq/L (ref 3.5–5.3)
SODIUM: 141 meq/L (ref 135–145)

## 2014-03-14 LAB — CBC WITH DIFFERENTIAL/PLATELET
BASOS PCT: 1 % (ref 0–1)
Basophils Absolute: 0.1 10*3/uL (ref 0.0–0.1)
Eosinophils Absolute: 0.8 10*3/uL — ABNORMAL HIGH (ref 0.0–0.7)
Eosinophils Relative: 7 % — ABNORMAL HIGH (ref 0–5)
HEMATOCRIT: 41.6 % (ref 36.0–46.0)
HEMOGLOBIN: 14.4 g/dL (ref 12.0–15.0)
Lymphocytes Relative: 20 % (ref 12–46)
Lymphs Abs: 2.2 10*3/uL (ref 0.7–4.0)
MCH: 30.6 pg (ref 26.0–34.0)
MCHC: 34.6 g/dL (ref 30.0–36.0)
MCV: 88.5 fL (ref 78.0–100.0)
MONOS PCT: 10 % (ref 3–12)
Monocytes Absolute: 1.1 10*3/uL — ABNORMAL HIGH (ref 0.1–1.0)
NEUTROS ABS: 6.7 10*3/uL (ref 1.7–7.7)
NEUTROS PCT: 62 % (ref 43–77)
Platelets: 301 10*3/uL (ref 150–400)
RBC: 4.7 MIL/uL (ref 3.87–5.11)
RDW: 13.7 % (ref 11.5–15.5)
WBC: 10.8 10*3/uL — ABNORMAL HIGH (ref 4.0–10.5)

## 2014-03-14 LAB — HEPATIC FUNCTION PANEL
ALT: 13 U/L (ref 0–35)
AST: 15 U/L (ref 0–37)
Albumin: 3.8 g/dL (ref 3.5–5.2)
Alkaline Phosphatase: 48 U/L (ref 39–117)
BILIRUBIN DIRECT: 0.2 mg/dL (ref 0.0–0.3)
Indirect Bilirubin: 0.7 mg/dL (ref 0.2–1.2)
Total Bilirubin: 0.9 mg/dL (ref 0.2–1.2)
Total Protein: 6 g/dL (ref 6.0–8.3)

## 2014-03-14 LAB — MAGNESIUM: Magnesium: 1.9 mg/dL (ref 1.5–2.5)

## 2014-03-14 NOTE — Progress Notes (Signed)
   Subjective:    Patient ID: Marie [name], female    DOB: 21-Nov-1935, 78 y.o.   MRN: [id number]  HPI 77 y.o. female with history of COPD, HTN, OA, unstable gait had a fall on Saturday. She had just gotten up from bed to go sit in a chair, she had her walker and thinks that her right leg "gave way" but is uncertain why she fell. Denies CP, SOB, dizziness before falling. Denies LOC/hitting her head. She fell on her lower back, and she states she has pain in her lower back, lower AB, and occ sharp pain on right flank. The lower back pain is worse with walking and the suprapubic pain is worse with trying to stand. She states when she stands since the fall she feels dizzy. She is on BASA but no other blood thinners.    Review of Systems  Constitutional: Negative.   HENT: Negative.   Respiratory: Negative.   Cardiovascular: Negative.   Gastrointestinal: Positive for abdominal pain. Negative for nausea, vomiting, diarrhea, constipation and rectal pain.  Genitourinary: Negative.   Musculoskeletal: Positive for back pain, gait problem and myalgias. Negative for arthralgias, joint swelling, neck pain and neck stiffness.  Skin: Positive for rash and wound.  Neurological: Positive for dizziness. Negative for tremors, seizures, facial asymmetry, speech difficulty, weakness, light-headedness, numbness and headaches.       Objective:   Physical Exam  Constitutional: She is oriented to person, place, and time. She appears well-developed and well-nourished. No distress.  HENT:  Head: Normocephalic and atraumatic.  Right Ear: External ear normal.  Left Ear: External ear normal.  Eyes: Conjunctivae and EOM are normal. Pupils are equal, round, and reactive to light.  Neck: Normal range of motion. Neck supple.  Cardiovascular: Normal rate and regular rhythm.   Murmur (4/6 holosystolic murmur) heard. Pulmonary/Chest: Effort normal and breath sounds normal.  Abdominal: Soft. Bowel sounds are normal. She  exhibits no distension. There is no tenderness. There is no rebound and no guarding.  Musculoskeletal: Normal range of motion. She exhibits tenderness (Spinal tenderness at L2-L3).  Neurological: She is alert and oriented to person, place, and time. She has normal reflexes. No cranial nerve deficit.  Skin: Skin is warm and dry. Abrasion (right arm) and bruising noted.      Assessment & Plan:  Dizziness/?Orthostatic Hypotension- she was suppose to stop the Lasix in March however she is still on it- will stop it for now and check BMP, Mag, CBC.  Suprapubic pain- nontender, benign AB- check urine, CBC Back pain s/p fall- just wants to take aleve, and will continue the tramadol. Husband requesting Xray.

## 2014-03-14 NOTE — Patient Instructions (Signed)
Stop lasix for now.   Fall Prevention and Home Safety Falls cause injuries and can affect all age groups. It is possible to use preventive measures to significantly decrease the likelihood of falls. There are many simple measures which can make your home safer and prevent falls. OUTDOORS  Repair cracks and edges of walkways and driveways.  Remove high doorway thresholds.  Trim shrubbery on the main path into your home.  Have good outside lighting.  Clear walkways of tools, rocks, debris, and clutter.  Check that handrails are not broken and are securely fastened. Both sides of steps should have handrails.  Have leaves, snow, and ice cleared regularly.  Use sand or salt on walkways during winter months.  In the garage, clean up grease or oil spills. BATHROOM  Install night lights.  Install grab bars by the toilet and in the tub and shower.  Use non-skid mats or decals in the tub or shower.  Place a plastic non-slip stool in the shower to sit on, if needed.  Keep floors dry and clean up all water on the floor immediately.  Remove soap buildup in the tub or shower on a regular basis.  Secure bath mats with non-slip, double-sided rug tape.  Remove throw rugs and tripping hazards from the floors. BEDROOMS  Install night lights.  Make sure a bedside light is easy to reach.  Do not use oversized bedding.  Keep a telephone by your bedside.  Have a firm chair with side arms to use for getting dressed.  Remove throw rugs and tripping hazards from the floor. KITCHEN  Keep handles on pots and pans turned toward the center of the stove. Use back burners when possible.  Clean up spills quickly and allow time for drying.  Avoid walking on wet floors.  Avoid hot utensils and knives.  Position shelves so they are not too high or low.  Place commonly used objects within easy reach.  If necessary, use a sturdy step stool with a grab bar when reaching.  Keep  electrical cables out of the way.  Do not use floor polish or wax that makes floors slippery. If you must use wax, use non-skid floor wax.  Remove throw rugs and tripping hazards from the floor. STAIRWAYS  Never leave objects on stairs.  Place handrails on both sides of stairways and use them. Fix any loose handrails. Make sure handrails on both sides of the stairways are as long as the stairs.  Check carpeting to make sure it is firmly attached along stairs. Make repairs to worn or loose carpet promptly.  Avoid placing throw rugs at the top or bottom of stairways, or properly secure the rug with carpet tape to prevent slippage. Get rid of throw rugs, if possible.  Have an electrician put in a light switch at the top and bottom of the stairs. OTHER FALL PREVENTION TIPS  Wear low-heel or rubber-soled shoes that are supportive and fit well. Wear closed toe shoes.  When using a stepladder, make sure it is fully opened and both spreaders are firmly locked. Do not climb a closed stepladder.  Add color or contrast paint or tape to grab bars and handrails in your home. Place contrasting color strips on first and last steps.  Learn and use mobility aids as needed. Install an electrical emergency response system.  Turn on lights to avoid dark areas. Replace light bulbs that burn out immediately. Get light switches that glow.  Arrange furniture to create clear pathways.  Keep furniture in the same place.  Firmly attach carpet with non-skid or double-sided tape.  Eliminate uneven floor surfaces.  Select a carpet pattern that does not visually hide the edge of steps.  Be aware of all pets. OTHER HOME SAFETY TIPS  Set the water temperature for 120 F (48.8 C).  Keep emergency numbers on or near the telephone.  Keep smoke detectors on every level of the home and near sleeping areas. Document Released: 09/25/2002 Document Revised: 04/05/2012 Document Reviewed: 12/25/2011 [facility]  Patient Information 2014 [employer].

## 2014-03-15 LAB — URINALYSIS, ROUTINE W REFLEX MICROSCOPIC
GLUCOSE, UA: NEGATIVE mg/dL
Hgb urine dipstick: NEGATIVE
Ketones, ur: NEGATIVE mg/dL
Nitrite: NEGATIVE
PH: 7 (ref 5.0–8.0)
Protein, ur: NEGATIVE mg/dL
Specific Gravity, Urine: 1.021 (ref 1.005–1.030)
Urobilinogen, UA: 1 mg/dL (ref 0.0–1.0)

## 2014-03-15 LAB — URINALYSIS, MICROSCOPIC ONLY
BACTERIA UA: NONE SEEN
CRYSTALS: NONE SEEN

## 2014-03-15 LAB — URINE CULTURE: Colony Count: 70000

## 2014-03-16 MED ORDER — CIPROFLOXACIN HCL 250 MG PO TABS: 500.0000 mg | ORAL_TABLET | Freq: Two times a day (BID) | ORAL | Status: AC

## 2014-03-16 NOTE — Addendum Note (Signed)
Addended by: Vicie Mutters R on: 03/16/2014 11:22 AM   Modules accepted: Orders

## 2014-03-20 DIAGNOSIS — M545 Low back pain, unspecified: Secondary | ICD-10-CM | POA: Diagnosis not present

## 2014-03-20 DIAGNOSIS — S32009A Unspecified fracture of unspecified lumbar vertebra, initial encounter for closed fracture: Secondary | ICD-10-CM | POA: Diagnosis not present

## 2014-03-25 ENCOUNTER — Other Ambulatory Visit: Payer: Self-pay | Admitting: Emergency Medicine

## 2014-03-28 ENCOUNTER — Other Ambulatory Visit: Payer: Self-pay | Admitting: Physician Assistant

## 2014-03-28 MED ORDER — TRAMADOL HCL 50 MG PO TABS: 50.0000 mg | ORAL_TABLET | Freq: Two times a day (BID) | ORAL | Status: DC | PRN

## 2014-04-02 ENCOUNTER — Other Ambulatory Visit: Payer: Self-pay | Admitting: Emergency Medicine

## 2014-04-10 ENCOUNTER — Other Ambulatory Visit: Payer: Self-pay | Admitting: Internal Medicine

## 2014-04-10 DIAGNOSIS — Z1231 Encounter for screening mammogram for malignant neoplasm of breast: Secondary | ICD-10-CM

## 2014-04-18 ENCOUNTER — Other Ambulatory Visit: Payer: Self-pay | Admitting: Physician Assistant

## 2014-04-26 DIAGNOSIS — M545 Low back pain, unspecified: Secondary | ICD-10-CM | POA: Diagnosis not present

## 2014-04-26 DIAGNOSIS — M25569 Pain in unspecified knee: Secondary | ICD-10-CM | POA: Diagnosis not present

## 2014-05-01 ENCOUNTER — Other Ambulatory Visit: Payer: Self-pay | Admitting: Internal Medicine

## 2014-05-01 ENCOUNTER — Ambulatory Visit (HOSPITAL_COMMUNITY)
Admission: RE | Admit: 2014-05-01 | Discharge: 2014-05-01 | Disposition: A | Discharge status: Home / Self Care | Payer: Medicare Other | Source: Ambulatory Visit | Attending: Internal Medicine | Admitting: Internal Medicine

## 2014-05-01 ENCOUNTER — Ambulatory Visit (INDEPENDENT_AMBULATORY_CARE_PROVIDER_SITE_OTHER): Payer: Medicare Other | Admitting: Emergency Medicine

## 2014-05-01 ENCOUNTER — Encounter: Payer: Self-pay | Admitting: Emergency Medicine

## 2014-05-01 VITALS — BP 104/58 | HR 74 | Temp 98.6°F | Resp 16 | Ht 64.0 in | Wt 162.0 lb

## 2014-05-01 DIAGNOSIS — M25561 Pain in right knee: Secondary | ICD-10-CM

## 2014-05-01 DIAGNOSIS — Z1231 Encounter for screening mammogram for malignant neoplasm of breast: Secondary | ICD-10-CM

## 2014-05-01 DIAGNOSIS — R7309 Other abnormal glucose: Secondary | ICD-10-CM | POA: Diagnosis not present

## 2014-05-01 DIAGNOSIS — G8929 Other chronic pain: Secondary | ICD-10-CM | POA: Diagnosis not present

## 2014-05-01 DIAGNOSIS — M25569 Pain in unspecified knee: Secondary | ICD-10-CM | POA: Diagnosis not present

## 2014-05-01 DIAGNOSIS — E782 Mixed hyperlipidemia: Secondary | ICD-10-CM | POA: Diagnosis not present

## 2014-05-01 DIAGNOSIS — I10 Essential (primary) hypertension: Secondary | ICD-10-CM | POA: Diagnosis not present

## 2014-05-01 LAB — CBC WITH DIFFERENTIAL/PLATELET
BASOS PCT: 0 % (ref 0–1)
Basophils Absolute: 0 10*3/uL (ref 0.0–0.1)
Eosinophils Absolute: 0.1 10*3/uL (ref 0.0–0.7)
Eosinophils Relative: 1 % (ref 0–5)
HEMATOCRIT: 45.2 % (ref 36.0–46.0)
Hemoglobin: 15.3 g/dL — ABNORMAL HIGH (ref 12.0–15.0)
Lymphocytes Relative: 16 % (ref 12–46)
Lymphs Abs: 1.6 10*3/uL (ref 0.7–4.0)
MCH: 30.4 pg (ref 26.0–34.0)
MCHC: 33.8 g/dL (ref 30.0–36.0)
MCV: 89.7 fL (ref 78.0–100.0)
MONO ABS: 0.7 10*3/uL (ref 0.1–1.0)
MONOS PCT: 7 % (ref 3–12)
NEUTROS PCT: 76 % (ref 43–77)
Neutro Abs: 7.6 10*3/uL (ref 1.7–7.7)
Platelets: 330 10*3/uL (ref 150–400)
RBC: 5.04 MIL/uL (ref 3.87–5.11)
RDW: 13.4 % (ref 11.5–15.5)
WBC: 10 10*3/uL (ref 4.0–10.5)

## 2014-05-01 LAB — HEMOGLOBIN A1C
HEMOGLOBIN A1C: 5.9 % — AB (ref <5.7)
MEAN PLASMA GLUCOSE: 123 mg/dL — AB (ref <117)

## 2014-05-01 NOTE — Patient Instructions (Signed)
Knee Exercises EXERCISES RANGE OF MOTION(ROM) AND STRETCHING EXERCISES These exercises may help you when beginning to rehabilitate your injury. Your symptoms may resolve with or without further involvement from your physician, physical therapist or athletic trainer. While completing these exercises, remember:   Restoring tissue flexibility helps normal motion to return to the joints. This allows healthier, less painful movement and activity.  An effective stretch should be held for at least 30 seconds.  A stretch should never be painful. You should only feel a gentle lengthening or release in the stretched tissue. STRETCH - Knee Extension, Prone  Lie on your stomach on a firm surface, such as a bed or countertop. Place your right / left knee and leg just beyond the edge of the surface. You may wish to place a towel under the far end of your right / left thigh for comfort.  Relax your leg muscles and allow gravity to straighten your knee. Your clinician may advise you to add an ankle weight if more resistance is helpful for you.  You should feel a stretch in the back of your right / left knee. Hold this position for __________ seconds. Repeat __________ times. Complete this stretch __________ times per day. * Your physician, physical therapist or athletic trainer may ask you to add ankle weight to enhance your stretch.  RANGE OF MOTION - Knee Flexion, Active  Lie on your back with both knees straight. (If this causes back discomfort, bend your opposite knee, placing your foot flat on the floor.)  Slowly slide your heel back toward your buttocks until you feel a gentle stretch in the front of your knee or thigh.  Hold for __________ seconds. Slowly slide your heel back to the starting position. Repeat __________ times. Complete this exercise __________ times per day.  STRETCH - Quadriceps, Prone   Lie on your stomach on a firm surface, such as a bed or padded floor.  Bend your right /  left knee and grasp your ankle. If you are unable to reach, your ankle or pant leg, use a belt around your foot to lengthen your reach.  Gently pull your heel toward your buttocks. Your knee should not slide out to the side. You should feel a stretch in the front of your thigh and/or knee.  Hold this position for __________ seconds. Repeat __________ times. Complete this stretch __________ times per day.  STRETCH - Hamstrings, Supine   Lie on your back. Loop a belt or towel over the ball of your right / left foot.  Straighten your right / left knee and slowly pull on the belt to raise your leg. Do not allow the right / left knee to bend. Keep your opposite leg flat on the floor.  Raise the leg until you feel a gentle stretch behind your right / left knee or thigh. Hold this position for __________ seconds. Repeat __________ times. Complete this stretch __________ times per day.  STRENGTHENING EXERCISES These exercises may help you when beginning to rehabilitate your injury. They may resolve your symptoms with or without further involvement from your physician, physical therapist or athletic trainer. While completing these exercises, remember:   Muscles can gain both the endurance and the strength needed for everyday activities through controlled exercises.  Complete these exercises as instructed by your physician, physical therapist or athletic trainer. Progress the resistance and repetitions only as guided.  You may experience muscle soreness or fatigue, but the pain or discomfort you are trying to eliminate should  never worsen during these exercises. If this pain does worsen, stop and make certain you are following the directions exactly. If the pain is still present after adjustments, discontinue the exercise until you can discuss the trouble with your clinician. STRENGTH - Quadriceps, Isometrics  Lie on your back with your right / left leg extended and your opposite knee  bent.  Gradually tense the muscles in the front of your right / left thigh. You should see either your knee cap slide up toward your hip or increased dimpling just above the knee. This motion will push the back of the knee down toward the floor/mat/bed on which you are lying.  Hold the muscle as tight as you can without increasing your pain for __________ seconds.  Relax the muscles slowly and completely in between each repetition. Repeat __________ times. Complete this exercise __________ times per day.  STRENGTH - Quadriceps, Short Arcs   Lie on your back. Place a __________ inch towel roll under your knee so that the knee slightly bends.  Raise only your lower leg by tightening the muscles in the front of your thigh. Do not allow your thigh to rise.  Hold this position for __________ seconds. Repeat __________ times. Complete this exercise __________ times per day.  OPTIONAL ANKLE WEIGHTS: Begin with ____________________, but DO NOT exceed ____________________. Increase in 1 pound/0.5 kilogram increments.  STRENGTH - Quadriceps, Straight Leg Raises  Quality counts! Watch for signs that the quadriceps muscle is working to insure you are strengthening the correct muscles and not "cheating" by substituting with healthier muscles.  Lay on your back with your right / left leg extended and your opposite knee bent.  Tense the muscles in the front of your right / left thigh. You should see either your knee cap slide up or increased dimpling just above the knee. Your thigh may even quiver.  Tighten these muscles even more and raise your leg 4 to 6 inches off the floor. Hold for __________ seconds.  Keeping these muscles tense, lower your leg.  Relax the muscles slowly and completely in between each repetition. Repeat __________ times. Complete this exercise __________ times per day.  STRENGTH - Hamstring, Curls  Lay on your stomach with your legs extended. (If you lay on a bed, your feet  may hang over the edge.)  Tighten the muscles in the back of your thigh to bend your right / left knee up to 90 degrees. Keep your hips flat on the bed/floor.  Hold this position for __________ seconds.  Slowly lower your leg back to the starting position. Repeat __________ times. Complete this exercise __________ times per day.  OPTIONAL ANKLE WEIGHTS: Begin with ____________________, but DO NOT exceed ____________________. Increase in 1 pound/0.5 kilogram increments.  STRENGTH - Quadriceps, Squats  Stand in a door frame so that your feet and knees are in line with the frame.  Use your hands for balance, not support, on the frame.  Slowly lower your weight, bending at the hips and knees. Keep your lower legs upright so that they are parallel with the door frame. Squat only within the range that does not increase your knee pain. Never let your hips drop below your knees.  Slowly return upright, pushing with your legs, not pulling with your hands. Repeat __________ times. Complete this exercise __________ times per day.  STRENGTH - Quadriceps, Wall Slides  Follow guidelines for form closely. Increased knee pain often results from poorly placed feet or knees.  Lean against  a smooth wall or door and walk your feet out 18-24 inches. Place your feet hip-width apart.  Slowly slide down the wall or door until your knees bend __________ degrees.* Keep your knees over your heels, not your toes, and in line with your hips, not falling to either side.  Hold for __________ seconds. Stand up to rest for __________ seconds in between each repetition. Repeat __________ times. Complete this exercise __________ times per day. * Your physician, physical therapist or athletic trainer will alter this angle based on your symptoms and progress. Document Released: 08/19/2005 Document Revised: 12/28/2011 Document Reviewed: 01/17/2009 [employer] Patient Information 2015 [place], [place]. This information is not  intended to replace advice given to you by your health care provider. Make sure you discuss any questions you have with your health care provider.

## 2014-05-01 NOTE — Progress Notes (Signed)
Subjective:    Patient ID: Marie [name], female    DOB: 01-Dec-1935, 78 y.o.   MRN: [id number]  HPI Comments: 78 yo WF presents for 3 month F/U for HTN, Cholesterol, Pre-Dm, D. Deficient. She is eating healthy. She is not exercising due to right knee pain. SHe has had recent injection in knee last week. She notes pain is worse than prior to injection. She denies any fever/ redness but + edema chronic. She notes BP is good.   WBC            10.8   03/14/2014 HGB            14.4   03/14/2014 HCT            41.6   03/14/2014 PLT             301   03/14/2014 GLUCOSE         109   03/14/2014 CHOL            164   11/16/2013 TRIG            226   11/16/2013 HDL              70   11/16/2013 LDLCALC          49   11/16/2013 ALT              13   03/14/2014 AST              15   03/14/2014 NA              141   03/14/2014 K               3.8   03/14/2014 CL               99   03/14/2014 CREATININE     1.04   03/14/2014 BUN              15   03/14/2014 CO2              29   03/14/2014 TSH           1.269   11/16/2013 INR            0.95   04/20/2013 HGBA1C          5.7   11/16/2013   Hypertension  Gastrophageal Reflux  Hyperlipidemia      Medication List       This list is accurate as of: 05/01/14  1:59 PM.  Always use your most recent med list.               acetaminophen 325 MG tablet  Commonly known as:  TYLENOL  Take 1-2 tablets (325-650 mg total) by mouth every 6 (six) hours as needed.     ADVAIR DISKUS 250-50 MCG/DOSE Aepb  Generic drug:  Fluticasone-Salmeterol  INHALE ONE PUFF BY MOUTH TWICE DAILY     albuterol (2.5 MG/3ML) 0.083% nebulizer solution  Commonly known as:  PROVENTIL  Take 3 mLs (2.5 mg total) by nebulization every 6 (six) hours as needed for wheezing or shortness of breath.     amitriptyline 10 MG tablet  Commonly known as:  ELAVIL  Take 10 mg by mouth 2 (two) times daily.     aspirin 81 MG tablet  Take 81 mg by mouth daily.     cholecalciferol 1000 UNITS  tablet  Commonly known as:  VITAMIN D  Take 5 tablets (5,000 Units total) by mouth daily.     FLUoxetine 40 MG capsule  Commonly known as:  PROZAC  Take 1 capsule (40 mg total) by mouth daily.     furosemide 40 MG tablet  Commonly known as:  LASIX  Take 1 tablet (40 mg total) by mouth daily.     losartan-hydrochlorothiazide 100-25 MG per tablet  Commonly known as:  HYZAAR  Take 1 tablet by mouth daily.     multivitamin capsule  Take 1 capsule by mouth daily.     omeprazole 40 MG capsule  Commonly known as:  PRILOSEC  TAKE ONE CAPSULE BY MOUTH TWICE DAILY FOR ACID REFLUX     oxybutynin 5 MG tablet  Commonly known as:  DITROPAN  Take 5 mg by mouth daily.     pravastatin 40 MG tablet  Commonly known as:  PRAVACHOL  TAKE ONE TABLET BY MOUTH ONCE DAILY FOR CHOLESTEROL     predniSONE 10 MG tablet  Commonly known as:  DELTASONE  Take 1 tablet (10 mg total) by mouth daily with breakfast.     traMADol 50 MG tablet  Commonly known as:  ULTRAM  TAKE ONE TABLET BY MOUTH TWICE DAILY AS NEEDED       Allergies  Allergen Reactions  . Codeine Other (See Comments)    hallucinations  . Demerol Other (See Comments)    hallucinations  . Morphine And Related Other (See Comments)    unknown  . Other Other (See Comments)    Invan 7- pt became red all over  . Sulfate Other (See Comments)    unknown   Past Medical History  Diagnosis Date  . Incontinence     not indicated at this visit.  . Osteoarthritis     hands,   . Wears dentures   . Osteoporosis   . Bulging disc   . Hyperlipidemia   . Chronic bronchitis     due to congestion at times, on prednisone and advair  . GERD (gastroesophageal reflux disease)   . Neuropathy     legs stay numb   . Depression     many years ago  . Hypertension     followed by intern med Dr. Marisue Brooklyn [phone]  . Tubulovillous adenoma of rectum   . Balance disorder 2008.      Falls a lot.  She can be standing and then leans too far over to one  side   . Wears glasses   . Hearing loss     mild  . Iatrogenic adrenal insufficiency      Review of Systems  Musculoskeletal: Positive for arthralgias.  All other systems reviewed and are negative.  BP 104/58  Pulse 74  Temp(Src) 98.6 F (37 C) (Temporal)  Resp 16  Ht 5\' 4"  (1.626 m)  Wt 162 lb (73.483 kg)  BMI 27.79 kg/m2     Objective:   Physical Exam  Nursing note and vitals reviewed. Constitutional: She is oriented to person, place, and time. She appears well-developed and well-nourished. No distress.  HENT:  Head: Normocephalic and atraumatic.  Right Ear: External ear normal.  Left Ear: External ear normal.  Nose: Nose normal.  Eyes: Conjunctivae and EOM are normal.  Neck: Normal range of motion. Neck supple. No JVD present. No thyromegaly present.  Cardiovascular: Normal rate, regular rhythm, normal heart sounds and intact distal pulses.   Pulmonary/Chest: Effort normal and breath sounds normal.  Abdominal: Soft. Bowel sounds are  normal. She exhibits no distension and no mass. There is no tenderness. There is no rebound and no guarding.  Musculoskeletal: Normal range of motion. She exhibits edema. She exhibits no tenderness.  R>L knee + edema/ crepitus  Lymphadenopathy:    She has no cervical adenopathy.  Neurological: She is alert and oriented to person, place, and time. No cranial nerve deficit. Coordination abnormal.  Walks with assistance  Skin: Skin is warm and dry. No rash noted. No erythema. No pallor.  Psychiatric: She has a normal mood and affect. Her behavior is normal. Judgment and thought content normal.          Assessment & Plan:  1.  3 month F/U for HTN, Cholesterol, Pre-Dm, D. Deficient. Needs healthy diet, cardio QD and obtain healthy weight. Check Labs, Check BP if >130/80 call office   2. Knee pain- TRY chair exercises/ Water exercises, if no change f/u ortho.

## 2014-05-02 ENCOUNTER — Ambulatory Visit (HOSPITAL_COMMUNITY): Payer: Medicare Other

## 2014-05-02 LAB — BASIC METABOLIC PANEL WITH GFR
BUN: 13 mg/dL (ref 6–23)
CALCIUM: 9.1 mg/dL (ref 8.4–10.5)
CO2: 33 mEq/L — ABNORMAL HIGH (ref 19–32)
Chloride: 100 mEq/L (ref 96–112)
Creat: 0.92 mg/dL (ref 0.50–1.10)
GFR, EST AFRICAN AMERICAN: 70 mL/min
GFR, Est Non African American: 61 mL/min
Glucose, Bld: 112 mg/dL — ABNORMAL HIGH (ref 70–99)
Potassium: 4 mEq/L (ref 3.5–5.3)
SODIUM: 140 meq/L (ref 135–145)

## 2014-05-02 LAB — HEPATIC FUNCTION PANEL
ALK PHOS: 54 U/L (ref 39–117)
ALT: 17 U/L (ref 0–35)
AST: 12 U/L (ref 0–37)
Albumin: 3.9 g/dL (ref 3.5–5.2)
BILIRUBIN DIRECT: 0.1 mg/dL (ref 0.0–0.3)
BILIRUBIN TOTAL: 0.4 mg/dL (ref 0.2–1.2)
Indirect Bilirubin: 0.3 mg/dL (ref 0.2–1.2)
Total Protein: 5.9 g/dL — ABNORMAL LOW (ref 6.0–8.3)

## 2014-05-02 LAB — LIPID PANEL
CHOL/HDL RATIO: 1.7 ratio
Cholesterol: 133 mg/dL (ref 0–200)
HDL: 77 mg/dL (ref >39)
LDL CALC: 30 mg/dL (ref 0–99)
Triglycerides: 131 mg/dL (ref <150)
VLDL: 26 mg/dL (ref 0–40)

## 2014-05-02 LAB — URIC ACID: Uric Acid, Serum: 4.5 mg/dL (ref 2.4–7.0)

## 2014-05-07 ENCOUNTER — Ambulatory Visit (HOSPITAL_COMMUNITY)
Admission: RE | Admit: 2014-05-07 | Discharge: 2014-05-07 | Disposition: A | Discharge status: Home / Self Care | Payer: Medicare Other | Source: Ambulatory Visit | Attending: Internal Medicine | Admitting: Internal Medicine

## 2014-05-07 DIAGNOSIS — Z1231 Encounter for screening mammogram for malignant neoplasm of breast: Secondary | ICD-10-CM | POA: Insufficient documentation

## 2014-05-18 ENCOUNTER — Other Ambulatory Visit: Payer: Self-pay | Admitting: Physician Assistant

## 2014-05-24 DIAGNOSIS — M25569 Pain in unspecified knee: Secondary | ICD-10-CM | POA: Diagnosis not present

## 2014-05-31 ENCOUNTER — Other Ambulatory Visit: Payer: Self-pay

## 2014-05-31 DIAGNOSIS — J440 Chronic obstructive pulmonary disease with acute lower respiratory infection: Secondary | ICD-10-CM

## 2014-05-31 DIAGNOSIS — R0600 Dyspnea, unspecified: Secondary | ICD-10-CM

## 2014-05-31 MED ORDER — AMITRIPTYLINE HCL 10 MG PO TABS: 10.0000 mg | ORAL_TABLET | Freq: Three times a day (TID) | ORAL | Status: DC

## 2014-06-01 ENCOUNTER — Other Ambulatory Visit: Payer: Self-pay | Admitting: *Deleted

## 2014-06-01 DIAGNOSIS — R0600 Dyspnea, unspecified: Secondary | ICD-10-CM

## 2014-06-01 DIAGNOSIS — R635 Abnormal weight gain: Secondary | ICD-10-CM

## 2014-06-01 MED ORDER — FUROSEMIDE 40 MG PO TABS: 40.0000 mg | ORAL_TABLET | Freq: Every day | ORAL | Status: DC

## 2014-06-01 MED ORDER — FLUOXETINE HCL 40 MG PO CAPS: 40.0000 mg | ORAL_CAPSULE | Freq: Every day | ORAL | Status: DC

## 2014-06-01 MED ORDER — OMEPRAZOLE 40 MG PO CPDR
DELAYED_RELEASE_CAPSULE | ORAL | Status: DC
Start: 2014-06-01 — End: 2015-06-11

## 2014-06-01 MED ORDER — LOSARTAN POTASSIUM-HCTZ 100-25 MG PO TABS: 1.0000 | ORAL_TABLET | Freq: Every day | ORAL | Status: DC

## 2014-06-01 MED ORDER — PREDNISONE 10 MG PO TABS: 10.0000 mg | ORAL_TABLET | Freq: Every day | ORAL | Status: DC

## 2014-06-01 MED ORDER — PRAVASTATIN SODIUM 40 MG PO TABS: ORAL_TABLET | ORAL | Status: DC

## 2014-06-04 ENCOUNTER — Telehealth: Payer: Self-pay | Admitting: *Deleted

## 2014-06-04 ENCOUNTER — Other Ambulatory Visit: Payer: Self-pay | Admitting: *Deleted

## 2014-06-04 MED ORDER — MAGIC MOUTHWASH: 5.0000 mL | Freq: Four times a day (QID) | ORAL | Status: DC | PRN

## 2014-06-04 NOTE — Telephone Encounter (Signed)
Patient called and states she has white patches on her tongue and her mouth is sore.  Per Dr Melford Aase, [place] sent to [facility] in [place], [place]

## 2014-06-05 ENCOUNTER — Other Ambulatory Visit: Payer: Self-pay | Admitting: *Deleted

## 2014-06-05 ENCOUNTER — Telehealth: Payer: Self-pay | Admitting: *Deleted

## 2014-06-05 MED ORDER — TRAMADOL HCL 50 MG PO TABS: 50.0000 mg | ORAL_TABLET | Freq: Four times a day (QID) | ORAL | Status: DC

## 2014-06-05 NOTE — Telephone Encounter (Signed)
Faxed RX for magic mouthwash to [facility] in [place], [place] -fax [phone].

## 2014-06-28 DIAGNOSIS — M1710 Unilateral primary osteoarthritis, unspecified knee: Secondary | ICD-10-CM | POA: Diagnosis not present

## 2014-07-05 DIAGNOSIS — M1710 Unilateral primary osteoarthritis, unspecified knee: Secondary | ICD-10-CM | POA: Diagnosis not present

## 2014-07-12 DIAGNOSIS — M1710 Unilateral primary osteoarthritis, unspecified knee: Secondary | ICD-10-CM | POA: Diagnosis not present

## 2014-07-19 DIAGNOSIS — M1711 Unilateral primary osteoarthritis, right knee: Secondary | ICD-10-CM | POA: Diagnosis not present

## 2014-07-26 DIAGNOSIS — M1711 Unilateral primary osteoarthritis, right knee: Secondary | ICD-10-CM | POA: Diagnosis not present

## 2014-08-09 DIAGNOSIS — M25561 Pain in right knee: Secondary | ICD-10-CM | POA: Diagnosis not present

## 2014-08-17 DIAGNOSIS — H52223 Regular astigmatism, bilateral: Secondary | ICD-10-CM | POA: Diagnosis not present

## 2014-08-17 DIAGNOSIS — H5213 Myopia, bilateral: Secondary | ICD-10-CM | POA: Diagnosis not present

## 2014-08-17 DIAGNOSIS — H524 Presbyopia: Secondary | ICD-10-CM | POA: Diagnosis not present

## 2014-08-17 DIAGNOSIS — H43819 Vitreous degeneration, unspecified eye: Secondary | ICD-10-CM | POA: Diagnosis not present

## 2014-08-20 DIAGNOSIS — M1711 Unilateral primary osteoarthritis, right knee: Secondary | ICD-10-CM | POA: Diagnosis not present

## 2014-08-23 ENCOUNTER — Encounter: Payer: Self-pay | Admitting: Internal Medicine

## 2014-08-23 ENCOUNTER — Ambulatory Visit (INDEPENDENT_AMBULATORY_CARE_PROVIDER_SITE_OTHER): Payer: Medicare Other | Admitting: Internal Medicine

## 2014-08-23 VITALS — BP 104/60 | HR 64 | Temp 97.5°F | Resp 16 | Ht 64.0 in | Wt 174.4 lb

## 2014-08-23 DIAGNOSIS — E559 Vitamin D deficiency, unspecified: Secondary | ICD-10-CM | POA: Diagnosis not present

## 2014-08-23 DIAGNOSIS — Z79899 Other long term (current) drug therapy: Secondary | ICD-10-CM | POA: Insufficient documentation

## 2014-08-23 DIAGNOSIS — R7309 Other abnormal glucose: Secondary | ICD-10-CM | POA: Diagnosis not present

## 2014-08-23 DIAGNOSIS — Z1212 Encounter for screening for malignant neoplasm of rectum: Secondary | ICD-10-CM

## 2014-08-23 DIAGNOSIS — S83241D Other tear of medial meniscus, current injury, right knee, subsequent encounter: Secondary | ICD-10-CM | POA: Diagnosis not present

## 2014-08-23 DIAGNOSIS — Z23 Encounter for immunization: Secondary | ICD-10-CM

## 2014-08-23 DIAGNOSIS — E785 Hyperlipidemia, unspecified: Secondary | ICD-10-CM | POA: Diagnosis not present

## 2014-08-23 DIAGNOSIS — I10 Essential (primary) hypertension: Secondary | ICD-10-CM | POA: Diagnosis not present

## 2014-08-23 DIAGNOSIS — Z1331 Encounter for screening for depression: Secondary | ICD-10-CM

## 2014-08-23 DIAGNOSIS — R7303 Prediabetes: Secondary | ICD-10-CM | POA: Insufficient documentation

## 2014-08-23 DIAGNOSIS — Z9181 History of falling: Secondary | ICD-10-CM

## 2014-08-23 DIAGNOSIS — M23200 Derangement of unspecified lateral meniscus due to old tear or injury, right knee: Secondary | ICD-10-CM | POA: Diagnosis not present

## 2014-08-23 LAB — CBC WITH DIFFERENTIAL/PLATELET
BASOS ABS: 0.1 10*3/uL (ref 0.0–0.1)
BASOS PCT: 1 % (ref 0–1)
EOS ABS: 0.3 10*3/uL (ref 0.0–0.7)
Eosinophils Relative: 4 % (ref 0–5)
HCT: 43.9 % (ref 36.0–46.0)
Hemoglobin: 15.1 g/dL — ABNORMAL HIGH (ref 12.0–15.0)
Lymphocytes Relative: 19 % (ref 12–46)
Lymphs Abs: 1.5 10*3/uL (ref 0.7–4.0)
MCH: 31.1 pg (ref 26.0–34.0)
MCHC: 34.4 g/dL (ref 30.0–36.0)
MCV: 90.3 fL (ref 78.0–100.0)
Monocytes Absolute: 0.6 10*3/uL (ref 0.1–1.0)
Monocytes Relative: 7 % (ref 3–12)
NEUTROS ABS: 5.6 10*3/uL (ref 1.7–7.7)
Neutrophils Relative %: 69 % (ref 43–77)
PLATELETS: 255 10*3/uL (ref 150–400)
RBC: 4.86 MIL/uL (ref 3.87–5.11)
RDW: 13.7 % (ref 11.5–15.5)
WBC: 8.1 10*3/uL (ref 4.0–10.5)

## 2014-08-23 NOTE — Progress Notes (Signed)
Patient ID: Marie [name], female   DOB: [birth year], 78 y.o.   MRN: [id number]  Annual Screening Comprehensive Examination  This very nice 78 y.o.female presents for complete physical.  Patient has been followed for HTN, COPD/Asthma,  Prediabetes, Hyperlipidemia, and Vitamin D Deficiency. Patient's asthma has been quiescent over the last year and seemingly well controlled since she's been on low dose prednisone. Patient also has an idiopathic peripheral neuropathy with unstable gait for which she uses a walker.    HTN predates since 1996. Patient's BP has been controlled at home and patient denies any cardiac symptoms as chest pain, palpitations, shortness of breath, dizziness or ankle swelling. Today's BP: 104/60 mmHg    Patient's hyperlipidemia is controlled with diet and medications. Patient denies myalgias or other medication SE's. Last lipids were Total  Chol 133; HDL 77; LDL  30; Trig 131 on 05/01/2014.   Patient has prediabetes predating since April 2011 with A1c of 6.0% and patient denies reactive hypoglycemic symptoms, visual blurring, diabetic polys, or paresthesias. Last A1c was  5.9% on 05/01/2014.   Finally, patient has history of Vitamin D Deficiency of 10 in 2008 and last Vitamin D was 37 in Oct 2014.  Medication Sig  . acetaminophen  325 MG tablet Take 1-2 tablets (325-650 mg total) by mouth every 6 (six) hours as needed.  Marland Kitchen ADVAIR DISKUS 250-50 INHALE ONE PUFF BY MOUTH TWICE DAILY  . albuterol 2.5 MG/3ML nebulizer soln Take 3 mLs (2.5 mg total) by nebulization every 6 (six) hours as needed   . amitriptyline (ELAVIL) 10 MG tablet Take 1 tablet (10 mg total) by mouth 3 (three) times daily.  Marland Kitchen aspirin 81 MG tablet Take 81 mg by mouth daily.   Marland Kitchen VITAMIN D 1000 UNITS tablet Take 5 tablets (5,000 Units total) by mouth daily.  Marland Kitchen FLUoxetine  40 MG capsule Take 1 capsule (40 mg total) by mouth daily.  . furosemide40 MG tablet Take 1 tablet (40 mg total) by mouth daily.  Marland Kitchen losartan-hctz  100-25 MG  Take 1 tablet by mouth daily.  . MULTIVITAMIN Take 1 capsule by mouth daily.   Marland Kitchen omeprazole  40 MG cap TAKE ONE CAPSULE BY MOUTH TWICE DAILY FOR ACID REFLUX  . oxybutynin  5 MG tablet Take 5 mg by mouth daily.   . pravastatin  40 MG tablet TAKE ONE TABLET BY MOUTH ONCE DAILY FOR CHOLESTEROL  . predniSONE  10 MG tablet Take 1 tablet (10 mg total) by mouth daily with breakfast.  . traMADol 50 MG tablet Take 1 tablet (50 mg total) by mouth 4 (four) times daily.   Allergies  Allergen Reactions  . Codeine Other (See Comments)    hallucinations  . Demerol Other (See Comments)    hallucinations  . Morphine And Related Other (See Comments)    unknown  . Other Other (See Comments)    Invan 7- pt became red all over  . Sulfate Other (See Comments)    unknown   Past Medical History  Diagnosis Date  . Incontinence     not indicated at this visit.  . Osteoarthritis     hands,   . Wears dentures   . Osteoporosis   . Bulging disc   . Hyperlipidemia   . Chronic bronchitis     due to congestion at times, on prednisone and advair  . GERD (gastroesophageal reflux disease)   . Neuropathy     legs stay numb   . Depression  many years ago  . Hypertension     followed by intern med Dr. Marisue Brooklyn [phone]  . Tubulovillous adenoma of rectum   . Balance disorder 2008.      Falls a lot.  She can be standing and then leans too far over to one side   . Wears glasses   . Hearing loss     mild  . Iatrogenic adrenal insufficiency    Health Maintenance  Topic Date Due  . INFLUENZA VACCINE  05/19/2014  . ZOSTAVAX  01/21/2015 (Originally 06/25/1998)  . COLONOSCOPY  03/19/2021  . TETANUS/TDAP  08/18/2023  . PNEUMOCOCCAL POLYSACCHARIDE VACCINE AGE 17 AND OVER  Completed   Immunization History  Administered Date(s) Administered  . Influenza, High Dose Seasonal PF 08/23/2014  . Pneumococcal Conjugate-13 08/23/2014  . Pneumococcal-Unspecified 07/20/2003, 08/17/2013  . Td 08/17/2013  .  Tdap 10/25/2012   Past Surgical History  Procedure Laterality Date  . Incontinence surgery      multiple procedures, not cured  . Rectal surgery      by dr. Marlou Starks, removal of polyp  . Appendectomy    . Abdominal hysterectomy    . Tonsillectomy    . Eye surgery      bilateral cataract surgery and lens implant  . Dilation and curettage of uterus    . Rectal biopsy  09/21/2011    Procedure: BIOPSY RECTAL;  Surgeon: Merrie Roof, MD;  Location: [facility];  Service: General;  Laterality: N/A;  3-4 cm  . Finger surgery      fusions and debridements for OA  . External fixation leg  10/25/2012    Procedure: EXTERNAL FIXATION LEG;  Surgeon: Rozanna Box, MD;  Location: [facility];  Service: Orthopedics;  Laterality: Right;   Family History  Problem Relation Age of Onset  . Cancer Sister     pt unaware of what kind  . High blood pressure Mother   . COPD Mother   . Heart attack Father    History  Substance Use Topics  . Smoking status: Never Smoker   . Smokeless tobacco: Never Used  . Alcohol Use: No     ROS Constitutional: Denies fever, chills, weight loss/gain, headaches, insomnia, fatigue, night sweats, and change in appetite. Eyes: Denies redness, blurred vision, diplopia, discharge, itchy, watery eyes.  ENT: Denies discharge, congestion, post nasal drip, epistaxis, sore throat, earache, hearing loss, dental pain, Tinnitus, Vertigo, Sinus pain, snoring.  Cardio: Denies chest pain, palpitations, irregular heartbeat, syncope, dyspnea, diaphoresis, orthopnea, PND, claudication, edema Respiratory: denies cough, dyspnea, DOE, pleurisy, hoarseness, laryngitis, wheezing.  Gastrointestinal: Denies dysphagia, heartburn, reflux, water brash, pain, cramps, nausea, vomiting, bloating, diarrhea, constipation, hematemesis, melena, hematochezia, jaundice, hemorrhoids Genitourinary: Denies dysuria, frequency, urgency, nocturia, hesitancy, discharge, hematuria, flank pain Breast: Breast lumps, nipple  discharge, bleeding.  Musculoskeletal: Denies arthralgia, myalgia, stiffness, Jt. Swelling, pain, limp, and strain/sprain. Denies falls. Skin: Denies puritis, rash, hives, warts, acne, eczema, changing in skin lesion Neuro: No weakness, tremor, incoordination, spasms, paresthesia, pain Psychiatric: Denies confusion, memory loss, sensory loss. Denies Depression. Endocrine: Denies change in weight, skin, hair change, nocturia, and paresthesia, diabetic polys, visual blurring, hyper / hypo glycemic episodes.  Heme/Lymph: No excessive bleeding, bruising, enlarged lymph nodes.  Physical Exam  BP 104/60 mmHg  Pulse 64  Temp(Src) 97.5 F (36.4 C)  Resp 16  Ht 5\' 4"  (1.626 m)  Wt 174 lb 6.4 oz (79.107 kg)  BMI 29.92 kg/m2  General Appearance: Well nourished and in no apparent distress.  Eyes: PERRLA, EOMs, conjunctiva no swelling or erythema, normal fundi and vessels. Sinuses: No frontal/maxillary tenderness ENT/Mouth: EACs patent / TMs  nl. Nares clear without erythema, swelling, mucoid exudates. Oral hygiene is good. No erythema, swelling, or exudate. Tongue normal, non-obstructing. Tonsils not swollen or erythematous. Hearing normal.  Neck: Supple, thyroid normal. No bruits, nodes or JVD. Respiratory: Respiratory effort normal.  BS equal and decreased bilateral without rales, rhonci, wheezing or stridor. Cardio: Heart sounds are normal with regular rate and rhythm and no murmurs, rubs or gallops. Peripheral pulses are normal and equal bilaterally without edema. No aortic or femoral bruits. Chest: symmetric with normal excursions and percussion. Breasts: Symmetric, without lumps, nipple discharge, retractions, or fibrocystic changes.  Abdomen: Flat, soft, with bowl sounds. Nontender, no guarding, rebound, hernias, masses, or organomegaly.  Lymphatics: Non tender without lymphadenopathy.  Genitourinary:  Musculoskeletal: Full ROM all peripheral extremities, joint stability, 5/5 strength, and  gait broad-based & stabilized by a walker. Skin: Warm and dry without rashes, lesions, cyanosis, clubbing or  ecchymosis.  Neuro: Cranial nerves intact, reflexes equal bilaterally. Normal muscle tone, no cerebellar symptoms. Decreased distal sensation of the lower extremities in a stocking distribution. Pysch: Awake and oriented X 3, normal affect, Insight and Judgment appropriate.  Assessment and Plan  1. Annual Screening Examination 2. Hypertension  3. Hyperlipidemia 4. Pre Diabetes 5. Vitamin D Deficiency 6. COPD/Asthma    Continue prudent diet as discussed, weight control, BP monitoring, regular exercise, and medications. Discussed med's effects and SE's. Screening labs and tests as requested with regular follow-up as recommended.

## 2014-08-23 NOTE — Patient Instructions (Signed)

## 2014-08-24 LAB — TSH: TSH: 1.759 u[IU]/mL (ref 0.350–4.500)

## 2014-08-24 LAB — BASIC METABOLIC PANEL WITH GFR
BUN: 19 mg/dL (ref 6–23)
CALCIUM: 9 mg/dL (ref 8.4–10.5)
CO2: 30 mEq/L (ref 19–32)
Chloride: 99 mEq/L (ref 96–112)
Creat: 1.04 mg/dL (ref 0.50–1.10)
GFR, EST AFRICAN AMERICAN: 60 mL/min
GFR, EST NON AFRICAN AMERICAN: 52 mL/min — AB
GLUCOSE: 93 mg/dL (ref 70–99)
Potassium: 4.3 mEq/L (ref 3.5–5.3)
SODIUM: 141 meq/L (ref 135–145)

## 2014-08-24 LAB — HEPATIC FUNCTION PANEL
ALT: 15 U/L (ref 0–35)
AST: 19 U/L (ref 0–37)
Albumin: 3.8 g/dL (ref 3.5–5.2)
Alkaline Phosphatase: 47 U/L (ref 39–117)
BILIRUBIN DIRECT: 0.1 mg/dL (ref 0.0–0.3)
BILIRUBIN TOTAL: 0.4 mg/dL (ref 0.2–1.2)
Indirect Bilirubin: 0.3 mg/dL (ref 0.2–1.2)
Total Protein: 5.8 g/dL — ABNORMAL LOW (ref 6.0–8.3)

## 2014-08-24 LAB — LIPID PANEL
CHOL/HDL RATIO: 2 ratio
CHOLESTEROL: 139 mg/dL (ref 0–200)
HDL: 69 mg/dL (ref >39)
LDL Cholesterol: 41 mg/dL (ref 0–99)
TRIGLYCERIDES: 145 mg/dL (ref <150)
VLDL: 29 mg/dL (ref 0–40)

## 2014-08-24 LAB — VITAMIN D 25 HYDROXY (VIT D DEFICIENCY, FRACTURES): VIT D 25 HYDROXY: 52 ng/mL (ref 30–89)

## 2014-08-24 LAB — MICROALBUMIN / CREATININE URINE RATIO
CREATININE, URINE: 72 mg/dL
MICROALB UR: 0.3 mg/dL (ref <2.0)
Microalb Creat Ratio: 4.2 mg/g (ref 0.0–30.0)

## 2014-08-24 LAB — URINALYSIS, MICROSCOPIC ONLY
Bacteria, UA: NONE SEEN
Casts: NONE SEEN
Crystals: NONE SEEN
Squamous Epithelial / LPF: NONE SEEN

## 2014-08-24 LAB — MAGNESIUM: MAGNESIUM: 1.9 mg/dL (ref 1.5–2.5)

## 2014-08-24 LAB — HEMOGLOBIN A1C
HEMOGLOBIN A1C: 5.7 % — AB (ref <5.7)
Mean Plasma Glucose: 117 mg/dL — ABNORMAL HIGH (ref <117)

## 2014-08-24 LAB — INSULIN, FASTING: Insulin fasting, serum: 6 u[IU]/mL (ref 2.0–19.6)

## 2014-08-29 ENCOUNTER — Ambulatory Visit (INDEPENDENT_AMBULATORY_CARE_PROVIDER_SITE_OTHER): Payer: Medicare Other | Admitting: *Deleted

## 2014-08-29 DIAGNOSIS — R8290 Unspecified abnormal findings in urine: Secondary | ICD-10-CM

## 2014-08-29 DIAGNOSIS — R8299 Other abnormal findings in urine: Secondary | ICD-10-CM | POA: Diagnosis not present

## 2014-08-29 NOTE — Progress Notes (Signed)
Patient ID: Marie [name], female   DOB: 04-22-1936, 78 y.o.   MRN: [id number] Patient presents for recheck UA, C&S with abnormal UA 08/23/14 and inability to add UC onto labs (after 24 hours).

## 2014-08-30 LAB — URINALYSIS, ROUTINE W REFLEX MICROSCOPIC
Bilirubin Urine: NEGATIVE
GLUCOSE, UA: NEGATIVE mg/dL
HGB URINE DIPSTICK: NEGATIVE
Ketones, ur: NEGATIVE mg/dL
Nitrite: NEGATIVE
PH: 6.5 (ref 5.0–8.0)
Protein, ur: NEGATIVE mg/dL
Specific Gravity, Urine: 1.015 (ref 1.005–1.030)
Urobilinogen, UA: 0.2 mg/dL (ref 0.0–1.0)

## 2014-08-30 LAB — URINALYSIS, MICROSCOPIC ONLY
CASTS: NONE SEEN
Crystals: NONE SEEN
SQUAMOUS EPITHELIAL / LPF: NONE SEEN

## 2014-09-01 ENCOUNTER — Other Ambulatory Visit: Payer: Self-pay | Admitting: Internal Medicine

## 2014-09-01 LAB — URINE CULTURE: Colony Count: >=100000

## 2014-09-01 MED ORDER — AMOXICILLIN 250 MG PO CAPS: ORAL_CAPSULE | ORAL | Status: DC

## 2014-09-03 ENCOUNTER — Ambulatory Visit: Payer: Self-pay | Admitting: Physician Assistant

## 2014-09-03 ENCOUNTER — Emergency Department (HOSPITAL_COMMUNITY): Payer: Medicare Other

## 2014-09-03 ENCOUNTER — Emergency Department (HOSPITAL_COMMUNITY)
Admission: EM | Admit: 2014-09-03 | Discharge: 2014-09-03 | Disposition: A | Discharge status: Home / Self Care | Payer: Medicare Other | Attending: Emergency Medicine | Admitting: Emergency Medicine

## 2014-09-03 ENCOUNTER — Encounter (HOSPITAL_COMMUNITY): Payer: Self-pay | Admitting: Emergency Medicine

## 2014-09-03 DIAGNOSIS — K219 Gastro-esophageal reflux disease without esophagitis: Secondary | ICD-10-CM | POA: Insufficient documentation

## 2014-09-03 DIAGNOSIS — E785 Hyperlipidemia, unspecified: Secondary | ICD-10-CM | POA: Diagnosis not present

## 2014-09-03 DIAGNOSIS — G629 Polyneuropathy, unspecified: Secondary | ICD-10-CM | POA: Insufficient documentation

## 2014-09-03 DIAGNOSIS — Z8709 Personal history of other diseases of the respiratory system: Secondary | ICD-10-CM | POA: Insufficient documentation

## 2014-09-03 DIAGNOSIS — H9190 Unspecified hearing loss, unspecified ear: Secondary | ICD-10-CM | POA: Insufficient documentation

## 2014-09-03 DIAGNOSIS — I10 Essential (primary) hypertension: Secondary | ICD-10-CM | POA: Diagnosis not present

## 2014-09-03 DIAGNOSIS — R05 Cough: Secondary | ICD-10-CM | POA: Diagnosis not present

## 2014-09-03 DIAGNOSIS — R531 Weakness: Secondary | ICD-10-CM | POA: Diagnosis not present

## 2014-09-03 DIAGNOSIS — Z79899 Other long term (current) drug therapy: Secondary | ICD-10-CM | POA: Insufficient documentation

## 2014-09-03 DIAGNOSIS — E860 Dehydration: Secondary | ICD-10-CM | POA: Insufficient documentation

## 2014-09-03 DIAGNOSIS — E876 Hypokalemia: Secondary | ICD-10-CM | POA: Insufficient documentation

## 2014-09-03 DIAGNOSIS — F329 Major depressive disorder, single episode, unspecified: Secondary | ICD-10-CM | POA: Diagnosis not present

## 2014-09-03 DIAGNOSIS — R059 Cough, unspecified: Secondary | ICD-10-CM

## 2014-09-03 DIAGNOSIS — M810 Age-related osteoporosis without current pathological fracture: Secondary | ICD-10-CM | POA: Insufficient documentation

## 2014-09-03 DIAGNOSIS — M1990 Unspecified osteoarthritis, unspecified site: Secondary | ICD-10-CM | POA: Diagnosis not present

## 2014-09-03 DIAGNOSIS — Z7982 Long term (current) use of aspirin: Secondary | ICD-10-CM | POA: Insufficient documentation

## 2014-09-03 DIAGNOSIS — R112 Nausea with vomiting, unspecified: Secondary | ICD-10-CM | POA: Diagnosis not present

## 2014-09-03 DIAGNOSIS — R404 Transient alteration of awareness: Secondary | ICD-10-CM | POA: Diagnosis not present

## 2014-09-03 LAB — URINALYSIS, ROUTINE W REFLEX MICROSCOPIC
Bilirubin Urine: NEGATIVE
Glucose, UA: NEGATIVE mg/dL
Hgb urine dipstick: NEGATIVE
Ketones, ur: NEGATIVE mg/dL
NITRITE: NEGATIVE
PROTEIN: NEGATIVE mg/dL
SPECIFIC GRAVITY, URINE: 1.01 (ref 1.005–1.030)
Urobilinogen, UA: 0.2 mg/dL (ref 0.0–1.0)
pH: 7 (ref 5.0–8.0)

## 2014-09-03 LAB — COMPREHENSIVE METABOLIC PANEL
ALBUMIN: 3.4 g/dL — AB (ref 3.5–5.2)
ALK PHOS: 50 U/L (ref 39–117)
ALT: 17 U/L (ref 0–35)
ANION GAP: 16 — AB (ref 5–15)
AST: 27 U/L (ref 0–37)
BUN: 27 mg/dL — AB (ref 6–23)
CO2: 27 mEq/L (ref 19–32)
Calcium: 8.9 mg/dL (ref 8.4–10.5)
Chloride: 96 mEq/L (ref 96–112)
Creatinine, Ser: 1.13 mg/dL — ABNORMAL HIGH (ref 0.50–1.10)
GFR calc non Af Amer: 46 mL/min — ABNORMAL LOW (ref >90)
GFR, EST AFRICAN AMERICAN: 53 mL/min — AB (ref >90)
GLUCOSE: 124 mg/dL — AB (ref 70–99)
POTASSIUM: 3.1 meq/L — AB (ref 3.7–5.3)
SODIUM: 139 meq/L (ref 137–147)
Total Bilirubin: 0.6 mg/dL (ref 0.3–1.2)
Total Protein: 6.4 g/dL (ref 6.0–8.3)

## 2014-09-03 LAB — URINE MICROSCOPIC-ADD ON

## 2014-09-03 LAB — CBC WITH DIFFERENTIAL/PLATELET
BASOS ABS: 0.1 10*3/uL (ref 0.0–0.1)
BASOS PCT: 1 % (ref 0–1)
Eosinophils Absolute: 0.3 10*3/uL (ref 0.0–0.7)
Eosinophils Relative: 2 % (ref 0–5)
HCT: 47.6 % — ABNORMAL HIGH (ref 36.0–46.0)
Hemoglobin: 15.7 g/dL — ABNORMAL HIGH (ref 12.0–15.0)
LYMPHS PCT: 8 % — AB (ref 12–46)
Lymphs Abs: 1.1 10*3/uL (ref 0.7–4.0)
MCH: 31.3 pg (ref 26.0–34.0)
MCHC: 33 g/dL (ref 30.0–36.0)
MCV: 94.8 fL (ref 78.0–100.0)
Monocytes Absolute: 0.6 10*3/uL (ref 0.1–1.0)
Monocytes Relative: 5 % (ref 3–12)
NEUTROS PCT: 84 % — AB (ref 43–77)
Neutro Abs: 11.7 10*3/uL — ABNORMAL HIGH (ref 1.7–7.7)
PLATELETS: 249 10*3/uL (ref 150–400)
RBC: 5.02 MIL/uL (ref 3.87–5.11)
RDW: 13.2 % (ref 11.5–15.5)
WBC: 13.7 10*3/uL — AB (ref 4.0–10.5)

## 2014-09-03 LAB — TROPONIN I

## 2014-09-03 MED ORDER — SODIUM CHLORIDE 0.9 % IV SOLN
Freq: Once | INTRAVENOUS | Status: AC
  Administered 2014-09-03: 11:00:00 via INTRAVENOUS

## 2014-09-03 MED ORDER — SODIUM CHLORIDE 0.9 % IV BOLUS (SEPSIS)
500.0000 mL | Freq: Once | INTRAVENOUS | Status: AC
  Administered 2014-09-03: 500 mL via INTRAVENOUS

## 2014-09-03 MED ORDER — POTASSIUM CHLORIDE 10 MEQ/100ML IV SOLN
10.0000 meq | Freq: Once | INTRAVENOUS | Status: AC
  Administered 2014-09-03: 10 meq via INTRAVENOUS
  Filled 2014-09-03: qty 100

## 2014-09-03 MED ORDER — POTASSIUM CHLORIDE CRYS ER 20 MEQ PO TBCR
40.0000 meq | EXTENDED_RELEASE_TABLET | Freq: Once | ORAL | Status: AC
  Administered 2014-09-03: 40 meq via ORAL
  Filled 2014-09-03: qty 2

## 2014-09-03 NOTE — ED Notes (Signed)
States feeling weakness for last 3-4 days  Had nvd at first but not now.  Hurting  in shoulders and neck states does that when she gets weak

## 2014-09-03 NOTE — ED Notes (Signed)
Pt denies chest pain but states that her shoulders an neck hurts when she gets weak no n/v/d in the lastb 24 hours

## 2014-09-03 NOTE — Discharge Instructions (Signed)
Stop taking Lasix (Furosemide). Take your potassium am and pm for next 5 days. You will be contacted if your Urine Culture shows infection. Stay hydrated.  Dehydration, Adult Dehydration means your body does not have as much fluid as it needs. Your kidneys, brain, and heart will not work properly without the right amount of fluids and salt.  HOME CARE  Ask your doctor how to replace body fluid losses (rehydrate).  Drink enough fluids to keep your pee (urine) clear or pale yellow.  Drink small amounts of fluids often if you feel sick to your stomach (nauseous) or throw up (vomit).  Eat like you normally do.  Avoid:  Foods or drinks high in sugar.  Bubbly (carbonated) drinks.  Juice.  Very hot or cold fluids.  Drinks with caffeine.  Fatty, greasy foods.  Alcohol.  Tobacco.  Eating too much.  Gelatin desserts.  Wash your hands to avoid spreading germs (bacteria, viruses).  Only take medicine as told by your doctor.  Keep all doctor visits as told. GET HELP RIGHT AWAY IF:   You cannot drink something without throwing up.  You get worse even with treatment.  Your vomit has blood in it or looks greenish.  Your poop (stool) has blood in it or looks black and tarry.  You have not peed in 6 to 8 hours.  You pee a small amount of very dark pee.  You have a fever.  You pass out (faint).  You have belly (abdominal) pain that gets worse or stays in one spot (localizes).  You have a rash, stiff neck, or bad headache.  You get easily annoyed, sleepy, or are hard to wake up.  You feel weak, dizzy, or very thirsty. MAKE SURE YOU:   Understand these instructions.  Will watch your condition.  Will get help right away if you are not doing well or get worse. Document Released: 08/01/2009 Document Revised: 12/28/2011 Document Reviewed: 05/25/2011 [facility] Patient Information 2015 [place], [place]. This information is not intended to replace advice given to you by  your health care provider. Make sure you discuss any questions you have with your health care provider.  Hypokalemia Hypokalemia means that the amount of potassium in the blood is lower than normal.Potassium is a chemical, called an electrolyte, that helps regulate the amount of fluid in the body. It also stimulates muscle contraction and helps nerves function properly.Most of the body's potassium is inside of cells, and only a very small amount is in the blood. Because the amount in the blood is so small, minor changes can be life-threatening. CAUSES  Antibiotics.  Diarrhea or vomiting.  Using laxatives too much, which can cause diarrhea.  Chronic kidney disease.  Water pills (diuretics).  Eating disorders (bulimia).  Low magnesium level.  Sweating a lot. SIGNS AND SYMPTOMS  Weakness.  Constipation.  Fatigue.  Muscle cramps.  Mental confusion.  Skipped heartbeats or irregular heartbeat (palpitations).  Tingling or numbness. DIAGNOSIS  Your health care provider can diagnose hypokalemia with blood tests. In addition to checking your potassium level, your health care provider may also check other lab tests. TREATMENT Hypokalemia can be treated with potassium supplements taken by mouth or adjustments in your current medicines. If your potassium level is very low, you may need to get potassium through a vein (IV) and be monitored in the hospital. A diet high in potassium is also helpful. Foods high in potassium are:  Nuts, such as peanuts and pistachios.  Seeds, such as sunflower seeds  and pumpkin seeds.  Peas, lentils, and lima beans.  Whole grain and bran cereals and breads.  Fresh fruit and vegetables, such as apricots, avocado, bananas, cantaloupe, kiwi, oranges, tomatoes, asparagus, and potatoes.  Orange and tomato juices.  Red meats.  Fruit yogurt. HOME CARE INSTRUCTIONS  Take all medicines as prescribed by your health care provider.  Maintain a healthy  diet by including nutritious food, such as fruits, vegetables, nuts, whole grains, and lean meats.  If you are taking a laxative, be sure to follow the directions on the label. SEEK MEDICAL CARE IF:  Your weakness gets worse.  You feel your heart pounding or racing.  You are vomiting or having diarrhea.  You are diabetic and having trouble keeping your blood glucose in the normal range. SEEK IMMEDIATE MEDICAL CARE IF:  You have chest pain, shortness of breath, or dizziness.  You are vomiting or having diarrhea for more than 2 days.  You faint. MAKE SURE YOU:   Understand these instructions.  Will watch your condition.  Will get help right away if you are not doing well or get worse. Document Released: 10/05/2005 Document Revised: 07/26/2013 Document Reviewed: 04/07/2013 [facility] Patient Information 2015 [place], [place]. This information is not intended to replace advice given to you by your health care provider. Make sure you discuss any questions you have with your health care provider.

## 2014-09-03 NOTE — ED Provider Notes (Signed)
CSN: [id number]     Arrival date & time 09/03/14  8466 History   First MD Initiated Contact with Patient 09/03/14 0932     Chief Complaint  Patient presents with  . Weakness      HPI  Patient presents for evaluation of generalized weakness. Feeling away for 3-4 days. On first day had some nausea and vomiting. However none sense. States her appetite has been good and taking in by mouth. States she feels generally weak. When she stands she gets dizzy. Same thing happened within the last year or 2 and husband states "she was dehydrated". She denies pain. No current nausea or vomiting. No difficulty breathing. No localizing weakness.  Take blood pressure medications. She states she takes only about a half pill in the morning now. She did take it this morning. No recent changes in her blood pressure medications. Her medication is Hyzaar. Also takes Lasix, and she is uncertain why.  Past Medical History  Diagnosis Date  . Incontinence     not indicated at this visit.  . Osteoarthritis     hands,   . Wears dentures   . Osteoporosis   . Bulging disc   . Hyperlipidemia   . Chronic bronchitis     due to congestion at times, on prednisone and advair  . GERD (gastroesophageal reflux disease)   . Neuropathy     legs stay numb   . Depression     many years ago  . Hypertension     followed by intern med Dr. Marisue Brooklyn [phone]  . Tubulovillous adenoma of rectum   . Balance disorder 2008.      Falls a lot.  She can be standing and then leans too far over to one side   . Wears glasses   . Hearing loss     mild  . Iatrogenic adrenal insufficiency    Past Surgical History  Procedure Laterality Date  . Incontinence surgery      multiple procedures, not cured  . Rectal surgery      by dr. Marlou Starks, removal of polyp  . Appendectomy    . Abdominal hysterectomy    . Tonsillectomy    . Eye surgery      bilateral cataract surgery and lens implant  . Dilation and curettage of uterus    . Rectal  biopsy  09/21/2011    Procedure: BIOPSY RECTAL;  Surgeon: Merrie Roof, MD;  Location: [facility];  Service: General;  Laterality: N/A;  3-4 cm  . Finger surgery      fusions and debridements for OA  . External fixation leg  10/25/2012    Procedure: EXTERNAL FIXATION LEG;  Surgeon: Rozanna Box, MD;  Location: [facility];  Service: Orthopedics;  Laterality: Right;   Family History  Problem Relation Age of Onset  . Cancer Sister     pt unaware of what kind  . High blood pressure Mother   . COPD Mother   . Heart attack Father    History  Substance Use Topics  . Smoking status: Never Smoker   . Smokeless tobacco: Never Used  . Alcohol Use: No   OB History    No data available     Review of Systems  Constitutional: Negative for fever, chills, diaphoresis, appetite change and fatigue.  HENT: Negative for mouth sores, sore throat and trouble swallowing.   Eyes: Negative for visual disturbance.  Respiratory: Negative for cough, chest tightness, shortness of breath and wheezing.  Cardiovascular: Negative for chest pain.  Gastrointestinal: Positive for nausea and vomiting. Negative for abdominal pain, diarrhea and abdominal distention.  Endocrine: Negative for polydipsia, polyphagia and polyuria.  Genitourinary: Negative for dysuria, frequency and hematuria.  Musculoskeletal: Negative for gait problem.  Skin: Negative for color change, pallor and rash.  Neurological: Positive for dizziness and weakness. Negative for syncope, light-headedness and headaches.  Hematological: Does not bruise/bleed easily.  Psychiatric/Behavioral: Negative for behavioral problems and confusion.      Allergies  Codeine; Demerol; Morphine and related; Other; and Sulfate  Home Medications   Prior to Admission medications   Medication Sig Start Date End Date Taking? Authorizing Provider  ADVAIR DISKUS 250-50 MCG/DOSE AEPB INHALE ONE PUFF BY MOUTH TWICE DAILY 11/07/13  Yes Melissa R Smith, PA-C  albuterol  (PROVENTIL) (2.5 MG/3ML) 0.083% nebulizer solution Take 3 mLs (2.5 mg total) by nebulization every 6 (six) hours as needed for wheezing or shortness of breath. 01/15/14  Yes Vicie Mutters, PA-C  amitriptyline (ELAVIL) 10 MG tablet Take 1 tablet (10 mg total) by mouth 3 (three) times daily. Patient taking differently: Take 10 mg by mouth 2 (two) times daily.  05/31/14  Yes [name], MD  aspirin 81 MG tablet Take 81 mg by mouth daily.    Yes Historical Provider, MD  cholecalciferol (VITAMIN D) 1000 UNITS tablet Take 5 tablets (5,000 Units total) by mouth daily. 10/28/12  Yes Lezlie Octave Black, NP  FLUoxetine (PROZAC) 40 MG capsule Take 1 capsule (40 mg total) by mouth daily. 06/01/14 06/01/15 Yes [name], MD  furosemide (LASIX) 40 MG tablet Take 1 tablet (40 mg total) by mouth daily. 06/01/14 06/01/15 Yes [name], MD  losartan-hydrochlorothiazide (HYZAAR) 100-25 MG per tablet Take 1 tablet by mouth daily. Patient taking differently: Take 0.5 tablets by mouth daily.  06/01/14  Yes [name], MD  Multiple Vitamin (MULTIVITAMIN) capsule Take 1 capsule by mouth daily.    Yes Historical Provider, MD  omeprazole (PRILOSEC) 40 MG capsule TAKE ONE CAPSULE BY MOUTH TWICE DAILY FOR ACID REFLUX 06/01/14  Yes [name], MD  oxybutynin (DITROPAN) 5 MG tablet Take 5 mg by mouth daily.  07/13/11  Yes Historical Provider, MD  pravastatin (PRAVACHOL) 40 MG tablet TAKE ONE TABLET BY MOUTH ONCE DAILY FOR CHOLESTEROL 06/01/14  Yes [name], MD  predniSONE (DELTASONE) 10 MG tablet Take 1 tablet (10 mg total) by mouth daily with breakfast. 06/01/14  Yes [name], MD  traMADol (ULTRAM) 50 MG tablet Take 1 tablet (50 mg total) by mouth 4 (four) times daily. Patient taking differently: Take 50 mg by mouth 2 (two) times daily.  06/05/14  Yes [name], MD  acetaminophen (TYLENOL) 325 MG tablet Take 1-2 tablets (325-650 mg total) by mouth every 6 (six) hours as needed. 10/27/12   Jari Pigg, PA-C  amoxicillin (AMOXIL) 250 MG capsule Take 1 capsule 3 x day after meal for 10 days 09/01/14   [name], MD   BP 111/45 mmHg  Pulse 71  Temp(Src) 98.3 F (36.8 C) (Oral)  Resp 21  SpO2 94% Physical Exam  Constitutional: She is oriented to person, place, and time. She appears well-developed and well-nourished. No distress.  HENT:  Head: Normocephalic.  Eyes: Conjunctivae are normal. Pupils are equal, round, and reactive to light. No scleral icterus.  Conjunctiva not pale.  Neck: Normal range of motion. Neck supple. No thyromegaly present.  Cardiovascular: Normal rate and regular rhythm.  Exam reveals no gallop and no friction rub.  No murmur heard. Pulmonary/Chest: Effort normal and breath sounds normal. No respiratory distress. She has no wheezes. She has no rales.  Abdominal: Soft. Bowel sounds are normal. She exhibits no distension. There is no tenderness. There is no rebound.  Musculoskeletal: Normal range of motion.  Neurological: She is alert and oriented to person, place, and time.  Skin: Skin is warm and dry. No rash noted.  No lower extremity edema  Psychiatric: She has a normal mood and affect. Her behavior is normal.    ED Course  Procedures (including critical care time) Labs Review Labs Reviewed  CBC WITH DIFFERENTIAL - Abnormal; Notable for the following:    WBC 13.7 (*)    Hemoglobin 15.7 (*)    HCT 47.6 (*)    Neutrophils Relative % 84 (*)    Neutro Abs 11.7 (*)    Lymphocytes Relative 8 (*)    All other components within normal limits  URINALYSIS, ROUTINE W REFLEX MICROSCOPIC - Abnormal; Notable for the following:    Leukocytes, UA TRACE (*)    All other components within normal limits  COMPREHENSIVE METABOLIC PANEL - Abnormal; Notable for the following:    Potassium 3.1 (*)    Glucose, Bld 124 (*)    BUN 27 (*)    Creatinine, Ser 1.13 (*)    Albumin 3.4 (*)    GFR calc non Af Amer 46 (*)    GFR calc Af Amer 53 (*)    Anion gap 16  (*)    All other components within normal limits  URINE MICROSCOPIC-ADD ON - Abnormal; Notable for the following:    Bacteria, UA MANY (*)    All other components within normal limits  URINE CULTURE  TROPONIN I    Imaging Review Dg Chest 2 View  09/03/2014   CLINICAL DATA:  Weakness for the last 3-4 days with recent nausea, vomiting and diarrhea. Shoulder pain and neck pain.  EXAM: CHEST  2 VIEW  COMPARISON:  01/15/2014.  FINDINGS: Trachea is midline. Heart size normal. Thoracic aorta is calcified. Mild biapical pleural thickening. Lungs are clear. No pleural fluid.  IMPRESSION: No acute findings.   Electronically Signed   By: Lorin Picket M.D.   On: 09/03/2014 11:27     EKG Interpretation   Date/Time:  Monday September 03 2014 09:46:53 EST Ventricular Rate:  68 PR Interval:  157 QRS Duration: 95 QT Interval:  475 QTC Calculation: 505 R Axis:   -41 Text Interpretation:  Sinus rhythm Inferior infarct, old Prolonged QT  interval Baseline wander in lead(s) V5 Confirmed by Jeneen Rinks  MD, [place]  [phone]) on 09/03/2014 9:55:17 AM      MDM   Final diagnoses:  Cough  Hypokalemia  Dehydration    On recheck patient states she is feeling "a ton better". Still having no dysuria. I discussed the high urea with her. She had a culture obtained by her primary care physician on Tuesday for a routine check. Has not heard results of culture now. I think her dehydration and hypokalemia are result of her Lasix use, coupled with a day or 2 of the poor intake and nausea and vomiting at the onset of her illness. I think she is appropriate for outpatient treatment. She has potassium at home. I have asked her to take this twice a day for 5 days, and to hold her furosemide.    Tanna Furry, MD 09/03/14 [phone]

## 2014-09-04 ENCOUNTER — Other Ambulatory Visit: Payer: Self-pay | Admitting: Orthopedic Surgery

## 2014-09-06 ENCOUNTER — Encounter: Payer: Self-pay | Admitting: Physician Assistant

## 2014-09-06 ENCOUNTER — Ambulatory Visit (INDEPENDENT_AMBULATORY_CARE_PROVIDER_SITE_OTHER): Payer: Medicare Other | Admitting: Physician Assistant

## 2014-09-06 VITALS — BP 124/78 | HR 72 | Temp 97.5°F | Resp 18 | Wt 175.0 lb

## 2014-09-06 DIAGNOSIS — E876 Hypokalemia: Secondary | ICD-10-CM | POA: Diagnosis not present

## 2014-09-06 DIAGNOSIS — I10 Essential (primary) hypertension: Secondary | ICD-10-CM

## 2014-09-06 DIAGNOSIS — E860 Dehydration: Secondary | ICD-10-CM | POA: Diagnosis not present

## 2014-09-06 DIAGNOSIS — G2581 Restless legs syndrome: Secondary | ICD-10-CM

## 2014-09-06 DIAGNOSIS — N390 Urinary tract infection, site not specified: Secondary | ICD-10-CM | POA: Diagnosis not present

## 2014-09-06 DIAGNOSIS — J449 Chronic obstructive pulmonary disease, unspecified: Secondary | ICD-10-CM

## 2014-09-06 LAB — URINE CULTURE: Colony Count: >=100000

## 2014-09-06 LAB — BASIC METABOLIC PANEL WITH GFR
BUN: 15 mg/dL (ref 6–23)
CALCIUM: 8.9 mg/dL (ref 8.4–10.5)
CHLORIDE: 104 meq/L (ref 96–112)
CO2: 31 meq/L (ref 19–32)
Creat: 0.99 mg/dL (ref 0.50–1.10)
GFR, Est African American: 64 mL/min
GFR, Est Non African American: 56 mL/min — ABNORMAL LOW
GLUCOSE: 96 mg/dL (ref 70–99)
POTASSIUM: 3.9 meq/L (ref 3.5–5.3)
SODIUM: 143 meq/L (ref 135–145)

## 2014-09-06 LAB — CBC WITH DIFFERENTIAL/PLATELET
Basophils Absolute: 0.1 10*3/uL (ref 0.0–0.1)
Basophils Relative: 1 % (ref 0–1)
Eosinophils Absolute: 0.4 10*3/uL (ref 0.0–0.7)
Eosinophils Relative: 4 % (ref 0–5)
HEMATOCRIT: 42.4 % (ref 36.0–46.0)
Hemoglobin: 14.2 g/dL (ref 12.0–15.0)
LYMPHS ABS: 1.6 10*3/uL (ref 0.7–4.0)
LYMPHS PCT: 18 % (ref 12–46)
MCH: 31.3 pg (ref 26.0–34.0)
MCHC: 33.5 g/dL (ref 30.0–36.0)
MCV: 93.4 fL (ref 78.0–100.0)
MONOS PCT: 7 % (ref 3–12)
MPV: 9.9 fL (ref 9.4–12.4)
Monocytes Absolute: 0.6 10*3/uL (ref 0.1–1.0)
NEUTROS ABS: 6.2 10*3/uL (ref 1.7–7.7)
NEUTROS PCT: 70 % (ref 43–77)
Platelets: 252 10*3/uL (ref 150–400)
RBC: 4.54 MIL/uL (ref 3.87–5.11)
RDW: 13 % (ref 11.5–15.5)
WBC: 8.8 10*3/uL (ref 4.0–10.5)

## 2014-09-06 LAB — HEPATIC FUNCTION PANEL
ALK PHOS: 44 U/L (ref 39–117)
ALT: 16 U/L (ref 0–35)
AST: 18 U/L (ref 0–37)
Albumin: 3.6 g/dL (ref 3.5–5.2)
BILIRUBIN DIRECT: 0.1 mg/dL (ref 0.0–0.3)
BILIRUBIN INDIRECT: 0.3 mg/dL (ref 0.2–1.2)
BILIRUBIN TOTAL: 0.4 mg/dL (ref 0.2–1.2)
Total Protein: 5.8 g/dL — ABNORMAL LOW (ref 6.0–8.3)

## 2014-09-06 NOTE — Patient Instructions (Addendum)
-Continue to take Hyzaar (Losartan with HCTZ) in the morning. -STOP taking the Lasix -Continue taking the potassium pill.  CALL ME with strength -Start drinking more water Every day you lose water through your breath, perspiration, urine and bowel movements. For your body to function properly, you must replenish its water supply by consuming beverages and foods that contain water.  So how much fluid does the average, healthy adult living in a temperate climate need? The Institute of Medicine determined that an adequate intake (AI) AI for women is about 9 cups (2.2 liters) of total beverages a day. Please try to drink water or flavored water (Try fruit in water or Propel or crystal light)  Make sure low water content and low sodium content.  -Please keep follow up appt on 11/29/14.  Dehydration, Adult Dehydration is when you lose more fluids from the body than you take in. Vital organs like the kidneys, brain, and heart cannot function without a proper amount of fluids and salt. Any loss of fluids from the body can cause dehydration.  CAUSES   Vomiting.  Diarrhea.  Excessive sweating.  Excessive urine output.  Fever. SYMPTOMS  Mild dehydration  Thirst.  Dry lips.  Slightly dry mouth. Moderate dehydration  Very dry mouth.  Sunken eyes.  Skin does not bounce back quickly when lightly pinched and released.  Dark urine and decreased urine production.  Decreased tear production.  Headache. Severe dehydration  Very dry mouth.  Extreme thirst.  Rapid, weak pulse (more than 100 beats per minute at rest).  Cold hands and feet.  Not able to sweat in spite of heat and temperature.  Rapid breathing.  Blue lips.  Confusion and lethargy.  Difficulty being awakened.  Minimal urine production.  No tears. DIAGNOSIS  Your caregiver will diagnose dehydration based on your symptoms and your exam. Blood and urine tests will help confirm the diagnosis. The diagnostic  evaluation should also identify the cause of dehydration. TREATMENT  Treatment of mild or moderate dehydration can often be done at home by increasing the amount of fluids that you drink. It is best to drink small amounts of fluid more often. Drinking too much at one time can make vomiting worse. Refer to the home care instructions below. Severe dehydration needs to be treated at the hospital where you will probably be given intravenous (IV) fluids that contain water and electrolytes. HOME CARE INSTRUCTIONS   Ask your caregiver about specific rehydration instructions.  Drink enough fluids to keep your urine clear or pale yellow.  Drink small amounts frequently if you have nausea and vomiting.  Eat as you normally do.  Avoid:  Foods or drinks high in sugar.  Carbonated drinks.  Juice.  Extremely hot or cold fluids.  Drinks with caffeine.  Fatty, greasy foods.  Alcohol.  Tobacco.  Overeating.  Gelatin desserts.  Wash your hands well to avoid spreading bacteria and viruses.  Only take over-the-counter or prescription medicines for pain, discomfort, or fever as directed by your caregiver.  Ask your caregiver if you should continue all prescribed and over-the-counter medicines.  Keep all follow-up appointments with your caregiver. SEEK MEDICAL CARE IF:  You have abdominal pain and it increases or stays in one area (localizes).  You have a rash, stiff neck, or severe headache.  You are irritable, sleepy, or difficult to awaken.  You are weak, dizzy, or extremely thirsty. SEEK IMMEDIATE MEDICAL CARE IF:   You are unable to keep fluids down or you get worse  despite treatment.  You have frequent episodes of vomiting or diarrhea.  You have blood or green matter (bile) in your vomit.  You have blood in your stool or your stool looks black and tarry.  You have not urinated in 6 to 8 hours, or you have only urinated a small amount of very dark urine.  You have a  fever.  You faint. MAKE SURE YOU:   Understand these instructions.  Will watch your condition.  Will get help right away if you are not doing well or get worse. Document Released: 10/05/2005 Document Revised: 12/28/2011 Document Reviewed: 05/25/2011 [facility] Patient Information 2015 [place], [place]. This information is not intended to replace advice given to you by your health care provider. Make sure you discuss any questions you have with your health care provider.

## 2014-09-06 NOTE — Progress Notes (Signed)
Subjective:    Patient ID: Marie [name], female    DOB: Dec 14, 1935, 78 y.o.   MRN: [id number]  HPI A 78yo Caucasian female presents to the office today for follow up after [facility] ED visit on 09/03/14 for dehydration.    ED Summary: Martin Majestic to the ED on 09/03/14 for weakness.  Per that visit she started having weakness 3-4 days before ED visit and had nausea and vomiting on the first day.  She got dizzy with standing and husband stated that this happened before due to dehydration.  She was currently taking medications for BP- 1/2 Hyzaar (losartan with HCTZ) in the morning and Lasix 40mg .   ED doctor told her to take potassium twice a day for 5 days and hold the lasix.  He thought the lasix was causing dehydration and low potassium.    Potassium pill-  Potassium 20 mEq- 2 tablets by mouth per day. States she has NOT taken the lasix since ED visit.    Urine culture on 08/29/14 showed enterococcus and was given Amoxicillin 250mg  TID for 10 days.  Started Amoxicillin on 09/04/14 after ED visit and should have last dose on 09/13/14. Urine culture at ED on 09/03/14 showed Pseudomonas aeruginosa with enterococcus- sensitivity report not back yet as of 09/06/14 at 1017. GFR = 46 on 09/03/14.  Water consumption- Patient only drinks 20oz coffee with french vanilla (sugar free/fat free) and does not like to drink water.  States she is feeling stronger and does not have muscles aches anymore in her shoulders.  She states she remembers having hypokalemia before and got the same muscle aches in her shoulders.  She does have restless legs at night and was wondering what to do about that.    Review of Systems  Constitutional: Negative.  Negative for fever, chills, diaphoresis and fatigue.  HENT: Negative.   Eyes: Negative.   Respiratory: Positive for shortness of breath and wheezing. Negative for cough and chest tightness.        States the SOB and wheezing has always been there and states she has not  been taking her inhalers (Advair and Proventil).  Patient has COPD and is also on prednisone.    Cardiovascular: Negative.   Gastrointestinal: Negative.   Genitourinary: Negative.  Negative for dysuria, urgency and frequency.  Musculoskeletal: Negative.   Skin: Negative.   Neurological: Positive for dizziness. Negative for light-headedness and headaches.  Psychiatric/Behavioral: Negative.    Past Medical History  Diagnosis Date  . Incontinence     not indicated at this visit.  . Osteoarthritis     hands,   . Wears dentures   . Osteoporosis   . Bulging disc   . Hyperlipidemia   . Chronic bronchitis     due to congestion at times, on prednisone and advair  . GERD (gastroesophageal reflux disease)   . Neuropathy     legs stay numb   . Depression     many years ago  . Hypertension     followed by intern med Dr. Marisue Brooklyn [phone]  . Tubulovillous adenoma of rectum   . Balance disorder 2008.      Falls a lot.  She can be standing and then leans too far over to one side   . Wears glasses   . Hearing loss     mild  . Iatrogenic adrenal insufficiency    Current Outpatient Prescriptions on File Prior to Visit  Medication Sig Dispense Refill  . acetaminophen (TYLENOL) 325 MG  tablet Take 1-2 tablets (325-650 mg total) by mouth every 6 (six) hours as needed. 90 tablet 0  . ADVAIR DISKUS 250-50 MCG/DOSE AEPB INHALE ONE PUFF BY MOUTH TWICE DAILY 60 each 6  . albuterol (PROVENTIL) (2.5 MG/3ML) 0.083% nebulizer solution Take 3 mLs (2.5 mg total) by nebulization every 6 (six) hours as needed for wheezing or shortness of breath. 75 mL 4  . amitriptyline (ELAVIL) 10 MG tablet Take 1 tablet (10 mg total) by mouth 3 (three) times daily. (Patient taking differently: Take 10 mg by mouth 2 (two) times daily. ) 270 tablet prn  . amoxicillin (AMOXIL) 250 MG capsule Take 1 capsule 3 x day after meal for 10 days 30 capsule 0  . aspirin 81 MG tablet Take 81 mg by mouth daily.     . cholecalciferol  (VITAMIN D) 1000 UNITS tablet Take 5 tablets (5,000 Units total) by mouth daily.    Marland Kitchen FLUoxetine (PROZAC) 40 MG capsule Take 1 capsule (40 mg total) by mouth daily. 90 capsule 11  . furosemide (LASIX) 40 MG tablet Take 1 tablet (40 mg total) by mouth daily. 90 tablet 11  . losartan-hydrochlorothiazide (HYZAAR) 100-25 MG per tablet Take 1 tablet by mouth daily. (Patient taking differently: Take 0.5 tablets by mouth daily. ) 90 tablet 11  . Multiple Vitamin (MULTIVITAMIN) capsule Take 1 capsule by mouth daily.     Marland Kitchen omeprazole (PRILOSEC) 40 MG capsule TAKE ONE CAPSULE BY MOUTH TWICE DAILY FOR ACID REFLUX 180 capsule 11  . oxybutynin (DITROPAN) 5 MG tablet Take 5 mg by mouth daily.     . pravastatin (PRAVACHOL) 40 MG tablet TAKE ONE TABLET BY MOUTH ONCE DAILY FOR CHOLESTEROL 90 tablet 11  . predniSONE (DELTASONE) 10 MG tablet Take 1 tablet (10 mg total) by mouth daily with breakfast. 90 tablet 11  . traMADol (ULTRAM) 50 MG tablet Take 1 tablet (50 mg total) by mouth 4 (four) times daily. (Patient taking differently: Take 50 mg by mouth 2 (two) times daily. ) 360 tablet 1   No current facility-administered medications on file prior to visit.   Allergies  Allergen Reactions  . Codeine Other (See Comments)    hallucinations  . Demerol Other (See Comments)    hallucinations  . Morphine And Related Other (See Comments)    unknown  . Other Other (See Comments)    Invan 7- pt became red all over  . Sulfate Other (See Comments)    unknown     BP 124/78 mmHg  Pulse 72  Temp(Src) 97.5 F (36.4 C)  Resp 18  Wt 175 lb (79.379 kg)  SpO2 98% Wt Readings from Last 3 Encounters:  09/06/14 175 lb (79.379 kg)  08/23/14 174 lb 6.4 oz (79.107 kg)  05/01/14 162 lb (73.483 kg)   Objective:   Physical Exam  Constitutional: She is oriented to person, place, and time. She appears well-developed and well-nourished. She does not have a sickly appearance. No distress.  HENT:  Head: Normocephalic.  Right  Ear: Tympanic membrane, external ear and ear canal normal.  Left Ear: Tympanic membrane, external ear and ear canal normal.  Nose: Nose normal. Right sinus exhibits no maxillary sinus tenderness and no frontal sinus tenderness. Left sinus exhibits no maxillary sinus tenderness and no frontal sinus tenderness.  Mouth/Throat: Uvula is midline, oropharynx is clear and moist and mucous membranes are normal. Mucous membranes are not pale and not dry. No trismus in the jaw. No uvula swelling. No oropharyngeal exudate, posterior  oropharyngeal edema, posterior oropharyngeal erythema or tonsillar abscesses.  Eyes: Conjunctivae and lids are normal. Pupils are equal, round, and reactive to light. Right eye exhibits no discharge. Left eye exhibits no discharge. No scleral icterus.  Neck: Trachea normal, normal range of motion and phonation normal. Neck supple. No tracheal tenderness present. No tracheal deviation present.  Cardiovascular: Normal rate, regular rhythm, S1 normal, S2 normal, normal heart sounds, intact distal pulses and normal pulses.  Exam reveals no gallop, no distant heart sounds and no friction rub.   No murmur heard. Pulmonary/Chest: Effort normal and breath sounds normal. No stridor. No respiratory distress. She has no decreased breath sounds. She has no wheezes. She has no rhonchi. She has no rales. She exhibits no tenderness.  Abdominal: Soft. Bowel sounds are normal. There is no tenderness. There is no rebound and no guarding.  Musculoskeletal: Normal range of motion. She exhibits no edema or tenderness.  Lymphadenopathy:  No tenderness or LAD.  Neurological: She is alert and oriented to person, place, and time. She has normal strength. No cranial nerve deficit. Gait normal.  Walks with walker.  Skin: Skin is warm, dry and intact. Bruising noted. No rash noted. She is not diaphoretic.  Some bruising on both forearms from IV lines.  Psychiatric: She has a normal mood and affect. Her speech  is normal and behavior is normal. Judgment and thought content normal. Cognition and memory are normal.  Vitals reviewed.     Assessment & Plan:  1. Dehydration -Start drinking more water.  Cut back on soda and coffee.  Coffee is a natural diuretic.   Every day you lose water through your breath, perspiration, urine and bowel movements. For your body to function properly, you must replenish its water supply by consuming beverages and foods that contain water.  So how much fluid does the average, healthy adult living in a temperate climate need? The Institute of Medicine determined that an adequate intake (AI) AI for women is about 9 cups (2.2 liters) of total beverages a day. Please try to drink water or flavored water (Try fruit in water or Propel or crystal light)  Make sure low sugar content and low sodium content. - CBC with Differential - BASIC METABOLIC PANEL WITH GFR - Hepatic function panel  2. Hypokalemia -STOP taking the Lasix and continue potassium pills (20 mEq BID) for right now.  Might adjust dose or stop potassium depending on lab results; and get another level in 4 weeks. - BASIC METABOLIC PANEL WITH GFR   3. Essential hypertension -Continue to take Hyzaar (Losartan with HCTZ) in the morning- 1/2 tablet daily. -Make sure you are montioring BP at home and let me know if it gets >140/90 or <100/70. -Start wearing light weight compression stockings to help with swelling in legs and to help push blood back up to heart.  Only wear them during the day and take them off before bed.   - CBC with Differential - BASIC METABOLIC PANEL WITH GFR - Hepatic function panel  4. UTI  -Continue the Amoxicillin 250mg  TID for 10 days- Last dose will be on 09/13/14. -Waiting for sensitivity report for urine culture done at ED on 09/03/14.   Might have to change medication.  Will call pt when results come in.   -Will recheck urine on 10/10/14 and BP.    5.COPD -Continue to take Advair,  Proventil and prednisone to help maintain COPD in control.  6. Restless Leg Syndrome - Do activities that keep  your mind alert during the day, such as crossword puzzles -Get moderate regular exercise -Massage your legs (or have someone massage them) -Apply heat to your legs with heating pads or by taking a warm bath -Avoid caffeine, nicotine, and alcohol; these things seem to make it worse  Discussed medication effects and SE's.  Pt agreed to treatment plan. If you are having CP, SOB, SOB on exertion, cough with sputum production, leg swelling, then go to the ER immediately.   Please keep your follow up appt on 11/29/14.  Lamon Rotundo, Stephani Police, PA-C 9:13 AM [facility]

## 2014-09-07 ENCOUNTER — Other Ambulatory Visit: Payer: Self-pay | Admitting: Physician Assistant

## 2014-09-07 DIAGNOSIS — N390 Urinary tract infection, site not specified: Secondary | ICD-10-CM

## 2014-09-07 MED ORDER — CIPROFLOXACIN HCL 500 MG PO TABS: 500.0000 mg | ORAL_TABLET | Freq: Two times a day (BID) | ORAL | Status: AC

## 2014-09-07 NOTE — Progress Notes (Signed)
Patient aware.

## 2014-09-10 ENCOUNTER — Other Ambulatory Visit: Payer: Self-pay | Admitting: *Deleted

## 2014-09-10 MED ORDER — NYSTATIN 100000 UNIT/ML MT SUSP: OROMUCOSAL | Status: DC

## 2014-09-12 ENCOUNTER — Encounter (HOSPITAL_BASED_OUTPATIENT_CLINIC_OR_DEPARTMENT_OTHER): Payer: Self-pay | Admitting: *Deleted

## 2014-09-12 NOTE — Progress Notes (Signed)
Pt was in er 2 weeks ago dehydration-lasix stopped-kcl given-drinking better Saw pcp-labd done 09/06/14-better-states she feels better

## 2014-09-19 ENCOUNTER — Ambulatory Visit (HOSPITAL_BASED_OUTPATIENT_CLINIC_OR_DEPARTMENT_OTHER): Payer: Medicare Other | Admitting: Anesthesiology

## 2014-09-19 ENCOUNTER — Ambulatory Visit (HOSPITAL_BASED_OUTPATIENT_CLINIC_OR_DEPARTMENT_OTHER)
Admission: RE | Admit: 2014-09-19 | Discharge: 2014-09-19 | Disposition: A | Discharge status: Home / Self Care | Payer: Medicare Other | Source: Ambulatory Visit | Attending: Orthopedic Surgery | Admitting: Orthopedic Surgery

## 2014-09-19 ENCOUNTER — Encounter (HOSPITAL_BASED_OUTPATIENT_CLINIC_OR_DEPARTMENT_OTHER)
Admission: RE | Disposition: A | Discharge status: Home / Self Care | Payer: Self-pay | Source: Ambulatory Visit | Attending: Orthopedic Surgery

## 2014-09-19 ENCOUNTER — Encounter (HOSPITAL_BASED_OUTPATIENT_CLINIC_OR_DEPARTMENT_OTHER): Payer: Self-pay | Admitting: *Deleted

## 2014-09-19 DIAGNOSIS — E785 Hyperlipidemia, unspecified: Secondary | ICD-10-CM | POA: Insufficient documentation

## 2014-09-19 DIAGNOSIS — M19041 Primary osteoarthritis, right hand: Secondary | ICD-10-CM | POA: Insufficient documentation

## 2014-09-19 DIAGNOSIS — M810 Age-related osteoporosis without current pathological fracture: Secondary | ICD-10-CM | POA: Diagnosis not present

## 2014-09-19 DIAGNOSIS — I10 Essential (primary) hypertension: Secondary | ICD-10-CM | POA: Insufficient documentation

## 2014-09-19 DIAGNOSIS — Z882 Allergy status to sulfonamides status: Secondary | ICD-10-CM | POA: Diagnosis not present

## 2014-09-19 DIAGNOSIS — M2341 Loose body in knee, right knee: Secondary | ICD-10-CM | POA: Diagnosis not present

## 2014-09-19 DIAGNOSIS — S83241A Other tear of medial meniscus, current injury, right knee, initial encounter: Secondary | ICD-10-CM | POA: Insufficient documentation

## 2014-09-19 DIAGNOSIS — M19042 Primary osteoarthritis, left hand: Secondary | ICD-10-CM | POA: Insufficient documentation

## 2014-09-19 DIAGNOSIS — Z885 Allergy status to narcotic agent status: Secondary | ICD-10-CM | POA: Insufficient documentation

## 2014-09-19 DIAGNOSIS — J42 Unspecified chronic bronchitis: Secondary | ICD-10-CM | POA: Insufficient documentation

## 2014-09-19 DIAGNOSIS — Y998 Other external cause status: Secondary | ICD-10-CM | POA: Diagnosis not present

## 2014-09-19 DIAGNOSIS — J449 Chronic obstructive pulmonary disease, unspecified: Secondary | ICD-10-CM | POA: Insufficient documentation

## 2014-09-19 DIAGNOSIS — X58XXXA Exposure to other specified factors, initial encounter: Secondary | ICD-10-CM | POA: Diagnosis not present

## 2014-09-19 DIAGNOSIS — Y9389 Activity, other specified: Secondary | ICD-10-CM | POA: Insufficient documentation

## 2014-09-19 DIAGNOSIS — K219 Gastro-esophageal reflux disease without esophagitis: Secondary | ICD-10-CM | POA: Insufficient documentation

## 2014-09-19 DIAGNOSIS — S83281A Other tear of lateral meniscus, current injury, right knee, initial encounter: Secondary | ICD-10-CM | POA: Insufficient documentation

## 2014-09-19 DIAGNOSIS — M2241 Chondromalacia patellae, right knee: Secondary | ICD-10-CM | POA: Insufficient documentation

## 2014-09-19 DIAGNOSIS — Z888 Allergy status to other drugs, medicaments and biological substances status: Secondary | ICD-10-CM | POA: Diagnosis not present

## 2014-09-19 DIAGNOSIS — Y9289 Other specified places as the place of occurrence of the external cause: Secondary | ICD-10-CM | POA: Insufficient documentation

## 2014-09-19 HISTORY — PX: KNEE ARTHROSCOPY WITH MEDIAL MENISECTOMY: SHX5651

## 2014-09-19 HISTORY — PX: CHONDROPLASTY: SHX5177

## 2014-09-19 HISTORY — PX: KNEE ARTHROSCOPY WITH LATERAL MENISECTOMY: SHX6193

## 2014-09-19 LAB — POCT HEMOGLOBIN-HEMACUE: HEMOGLOBIN: 13.4 g/dL (ref 12.0–15.0)

## 2014-09-19 LAB — GLUCOSE, CAPILLARY: Glucose-Capillary: 98 mg/dL (ref 70–99)

## 2014-09-19 SURGERY — ARTHROSCOPY, KNEE, WITH MEDIAL MENISCECTOMY
Anesthesia: General | Site: Knee | Laterality: Right

## 2014-09-19 MED ORDER — TRAMADOL HCL 50 MG PO TABS: 50.0000 mg | ORAL_TABLET | Freq: Four times a day (QID) | ORAL | Status: DC | PRN

## 2014-09-19 MED ORDER — CEFAZOLIN SODIUM-DEXTROSE 2-3 GM-% IV SOLR
2.0000 g | INTRAVENOUS | Status: AC
  Administered 2014-09-19: 2 g via INTRAVENOUS

## 2014-09-19 MED ORDER — OXYCODONE HCL 5 MG/5ML PO SOLN: 5.0000 mg | Freq: Once | ORAL | Status: DC | PRN

## 2014-09-19 MED ORDER — LACTATED RINGERS IV SOLN
INTRAVENOUS | Status: DC
  Administered 2014-09-19: 09:00:00 via INTRAVENOUS

## 2014-09-19 MED ORDER — HYDROCODONE-ACETAMINOPHEN 5-325 MG PO TABS: 1.0000 | ORAL_TABLET | Freq: Four times a day (QID) | ORAL | Status: DC | PRN

## 2014-09-19 MED ORDER — FENTANYL CITRATE 0.05 MG/ML IJ SOLN
INTRAMUSCULAR | Status: AC
  Filled 2014-09-19: qty 6

## 2014-09-19 MED ORDER — ONDANSETRON HCL 4 MG/2ML IJ SOLN: 4.0000 mg | Freq: Four times a day (QID) | INTRAMUSCULAR | Status: DC | PRN

## 2014-09-19 MED ORDER — BUPIVACAINE HCL (PF) 0.5 % IJ SOLN
INTRAMUSCULAR | Status: AC
  Filled 2014-09-19: qty 30

## 2014-09-19 MED ORDER — EPHEDRINE SULFATE 50 MG/ML IJ SOLN
INTRAMUSCULAR | Status: DC | PRN
  Administered 2014-09-19: 10 mg via INTRAVENOUS

## 2014-09-19 MED ORDER — ONDANSETRON HCL 4 MG/2ML IJ SOLN
INTRAMUSCULAR | Status: DC | PRN
  Administered 2014-09-19: 4 mg via INTRAVENOUS

## 2014-09-19 MED ORDER — OXYCODONE HCL 5 MG PO TABS: 5.0000 mg | ORAL_TABLET | Freq: Once | ORAL | Status: DC | PRN

## 2014-09-19 MED ORDER — FENTANYL CITRATE 0.05 MG/ML IJ SOLN: 50.0000 ug | INTRAMUSCULAR | Status: DC | PRN

## 2014-09-19 MED ORDER — SODIUM CHLORIDE 0.9 % IR SOLN
Status: DC | PRN
  Administered 2014-09-19: 3000 mL

## 2014-09-19 MED ORDER — CHLORHEXIDINE GLUCONATE 4 % EX LIQD: 60.0000 mL | Freq: Once | CUTANEOUS | Status: DC

## 2014-09-19 MED ORDER — PROPOFOL 10 MG/ML IV BOLUS
INTRAVENOUS | Status: DC | PRN
  Administered 2014-09-19: 30 mg via INTRAVENOUS
  Administered 2014-09-19: 200 mg via INTRAVENOUS

## 2014-09-19 MED ORDER — BUPIVACAINE HCL (PF) 0.5 % IJ SOLN
INTRAMUSCULAR | Status: DC | PRN
  Administered 2014-09-19: 20 mL via INTRA_ARTICULAR

## 2014-09-19 MED ORDER — BUPIVACAINE HCL (PF) 0.25 % IJ SOLN
INTRAMUSCULAR | Status: AC
  Filled 2014-09-19: qty 30

## 2014-09-19 MED ORDER — CEFAZOLIN SODIUM-DEXTROSE 2-3 GM-% IV SOLR
INTRAVENOUS | Status: AC
  Filled 2014-09-19: qty 50

## 2014-09-19 MED ORDER — MIDAZOLAM HCL 2 MG/2ML IJ SOLN
INTRAMUSCULAR | Status: AC
  Filled 2014-09-19: qty 2

## 2014-09-19 MED ORDER — FENTANYL CITRATE 0.05 MG/ML IJ SOLN: 25.0000 ug | INTRAMUSCULAR | Status: DC | PRN

## 2014-09-19 MED ORDER — FENTANYL CITRATE 0.05 MG/ML IJ SOLN
INTRAMUSCULAR | Status: DC | PRN
  Administered 2014-09-19: 25 ug via INTRAVENOUS
  Administered 2014-09-19: 50 ug via INTRAVENOUS
  Administered 2014-09-19: 25 ug via INTRAVENOUS

## 2014-09-19 MED ORDER — MIDAZOLAM HCL 2 MG/2ML IJ SOLN: 1.0000 mg | INTRAMUSCULAR | Status: DC | PRN

## 2014-09-19 MED ORDER — LIDOCAINE HCL (CARDIAC) 20 MG/ML IV SOLN
INTRAVENOUS | Status: DC | PRN
  Administered 2014-09-19: 80 mg via INTRAVENOUS

## 2014-09-19 MED ORDER — EPINEPHRINE HCL 1 MG/ML IJ SOLN
INTRAMUSCULAR | Status: AC
  Filled 2014-09-19: qty 1

## 2014-09-19 SURGICAL SUPPLY — 43 items
BANDAGE ELASTIC 6 VELCRO ST LF (GAUZE/BANDAGES/DRESSINGS) ×4 IMPLANT
BLADE 4.2CUDA (BLADE) IMPLANT
BLADE GREAT WHITE 4.2 (BLADE) ×3 IMPLANT
BLADE GREAT WHITE 4.2MM (BLADE) ×1
CANISTER SUCT 3000ML (MISCELLANEOUS) IMPLANT
CUTTER MENISCUS  4.2MM (BLADE)
CUTTER MENISCUS 4.2MM (BLADE) IMPLANT
DRAPE ARTHROSCOPY W/POUCH 114 (DRAPES) ×4 IMPLANT
DRSG EMULSION OIL 3X3 NADH (GAUZE/BANDAGES/DRESSINGS) ×4 IMPLANT
DURAPREP 26ML APPLICATOR (WOUND CARE) ×4 IMPLANT
ELECT MENISCUS 165MM 90D (ELECTRODE) IMPLANT
ELECT REM PT RETURN 9FT ADLT (ELECTROSURGICAL)
ELECTRODE REM PT RTRN 9FT ADLT (ELECTROSURGICAL) IMPLANT
GAUZE SPONGE 4X4 12PLY STRL (GAUZE/BANDAGES/DRESSINGS) ×4 IMPLANT
GLOVE BIOGEL PI IND STRL 7.0 (GLOVE) ×1 IMPLANT
GLOVE BIOGEL PI IND STRL 8 (GLOVE) ×4 IMPLANT
GLOVE BIOGEL PI INDICATOR 7.0 (GLOVE) ×2
GLOVE BIOGEL PI INDICATOR 8 (GLOVE) ×4
GLOVE ECLIPSE 6.5 STRL STRAW (GLOVE) ×2 IMPLANT
GLOVE ECLIPSE 7.5 STRL STRAW (GLOVE) ×8 IMPLANT
GLOVE EXAM NITRILE LRG STRL (GLOVE) ×2 IMPLANT
GOWN STRL REUS W/ TWL LRG LVL3 (GOWN DISPOSABLE) ×2 IMPLANT
GOWN STRL REUS W/ TWL XL LVL3 (GOWN DISPOSABLE) ×2 IMPLANT
GOWN STRL REUS W/TWL LRG LVL3 (GOWN DISPOSABLE) ×4
GOWN STRL REUS W/TWL XL LVL3 (GOWN DISPOSABLE) ×8 IMPLANT
HOLDER KNEE FOAM BLUE (MISCELLANEOUS) ×4 IMPLANT
IV NS IRRIG 3000ML ARTHROMATIC (IV SOLUTION) ×6 IMPLANT
KNEE WRAP E Z 3 GEL PACK (MISCELLANEOUS) ×4 IMPLANT
MANIFOLD NEPTUNE II (INSTRUMENTS) ×2 IMPLANT
NDL SAFETY ECLIPSE 18X1.5 (NEEDLE) ×1 IMPLANT
NEEDLE HYPO 18GX1.5 SHARP (NEEDLE) ×4
PACK ARTHROSCOPY DSU (CUSTOM PROCEDURE TRAY) ×4 IMPLANT
PACK BASIN DAY SURGERY FS (CUSTOM PROCEDURE TRAY) ×4 IMPLANT
PAD CAST 4YDX4 CTTN HI CHSV (CAST SUPPLIES) ×2 IMPLANT
PADDING CAST COTTON 4X4 STRL (CAST SUPPLIES) ×4
PENCIL BUTTON HOLSTER BLD 10FT (ELECTRODE) IMPLANT
SET ARTHROSCOPY TUBING (MISCELLANEOUS) ×4
SET ARTHROSCOPY TUBING LN (MISCELLANEOUS) ×2 IMPLANT
SUT ETHILON 4 0 PS 2 18 (SUTURE) IMPLANT
SYR 5ML LL (SYRINGE) ×4 IMPLANT
TOWEL OR 17X24 6PK STRL BLUE (TOWEL DISPOSABLE) ×4 IMPLANT
TOWEL OR NON WOVEN STRL DISP B (DISPOSABLE) ×1 IMPLANT
WATER STERILE IRR 1000ML POUR (IV SOLUTION) ×4 IMPLANT

## 2014-09-19 NOTE — Brief Op Note (Signed)
09/19/2014  11:43 AM  PATIENT:  Mathis Bud Scaffidi  78 y.o. female  PRE-OPERATIVE DIAGNOSIS:  RIGHT KNEE MEDIAL AND LATERAL MENISCAL TEARS  POST-OPERATIVE DIAGNOSIS:  RIGHT KNEE MEDIAL AND LATERAL MENISCAL TEARS  PROCEDURE:  Procedure(s): RIGHT KNEE ARTHROSCOPY WITH MEDIAL AND LATERAL MENISECTOMIES. CHONDROPLASTY OF PATELLA-FEMORAL JOINT (Right)  SURGEON:  Surgeon(s) and Role:    * Alta Corning, MD - Primary  PHYSICIAN ASSISTANT:   ASSISTANTS: bethune   ANESTHESIA:   general  EBL:  Total I/O In: 200 [I.V.:200] Out: -   BLOOD ADMINISTERED:none  DRAINS: none   LOCAL MEDICATIONS USED:  MARCAINE     SPECIMEN:  No Specimen  DISPOSITION OF SPECIMEN:  N/A  COUNTS:  YES  TOURNIQUET:  * No tourniquets in log *  DICTATION: .Other Dictation: Dictation Number [id number]  PLAN OF CARE: Discharge to home after PACU  PATIENT DISPOSITION:  PACU - hemodynamically stable.   Delay start of Pharmacological VTE agent (>24hrs) due to surgical blood loss or risk of bleeding: no

## 2014-09-19 NOTE — Discharge Instructions (Addendum)
POST-OP KNEE ARTHROSCOPY INSTRUCTIONS  Dr. Alain Marion PA-C  Pain You will be expected to have a moderate amount of pain in the affected knee for approximately two weeks. However, the first two days will be the most severe pain. A prescription has been provided to take as needed for the pain. The pain can be reduced by applying ice packs to the knee for the first 1-2 weeks post surgery. Also, keeping the leg elevated on pillows will help alleviate the pain. If you develop any acute pain or swelling in your calf muscle, please call the doctor.  Activity It is preferred that you stay at bed rest for approximately 24 hours. However, you may go to the bathroom with help. Weight bearing as tolerated. You may begin the knee exercises the day of surgery. Discontinue crutches as the knee pain resolves.  Dressing Keep the dressing dry. If the ace bandage should wrinkle or roll up, this can be rewrapped to prevent ridges in the bandage. You may remove all dressings in 48 hours,  apply bandaids to each wound. You may shower on the 4th day after surgery but no tub bath.  Symptoms to report to your doctor Extreme pain Extreme swelling Temperature above 101 degrees Change in the feeling, color, or movement of your toes Redness, heat, or swelling at your incision  Exercise If is preferred that as soon as possible you try to do a straight leg raise without bending the knee and concentrate on bringing the heel of your foot off the bed up to approximately 45 degrees and hold for the count of 10 seconds. Repeat this at least 10 times three or four times per day. Additional exercises are provided below.  You are encouraged to bend the knee as tolerated.  Follow-Up Call to schedule a follow-up appointment in 5-7 days. Our office # is [phone].  POST-OP EXERCISES  Short Arc Quads  1. Lie on back with legs straight. Place towel roll under thigh, just above knee. 2. Tighten thigh muscles to  straighten knee and lift heel off bed. 3. Hold for slow count of five, then lower. 4. Do three sets of ten    Straight Leg Raises  1. Lie on back with operative leg straight and other leg bent. 2. Keeping operative leg completely straight, slowly lift operative leg so foot is 5 inches off bed. 3. Hold for slow count of five, then lower. 4. Do three sets of ten.    DO BOTH EXERCISES 2 TIMES A DAY  Ankle Pumps  Work/move the operative ankle and foot up and down 10 times every hour while awake.POST-OP KNEE ARTHROSCOPY INSTRUCTIONS  Dr. Alain Marion PA-C  Pain You will be expected to have a moderate amount of pain in the affected knee for approximately two weeks. However, the first two days will be the most severe pain. A prescription has been provided to take as needed for the pain. The pain can be reduced by applying ice packs to the knee for the first 1-2 weeks post surgery. Also, keeping the leg elevated on pillows will help alleviate the pain. If you develop any acute pain or swelling in your calf muscle, please call the doctor.  Activity It is preferred that you stay at bed rest for approximately 24 hours. However, you may go to the bathroom with help. Weight bearing as tolerated. You may begin the knee exercises the day of surgery. Discontinue crutches as the knee pain resolves.  Dressing Keep the  dressing dry. If the ace bandage should wrinkle or roll up, this can be rewrapped to prevent ridges in the bandage. You may remove all dressings in 48 hours,  apply bandaids to each wound. You may shower on the 4th day after surgery but no tub bath.  Symptoms to report to your doctor Extreme pain Extreme swelling Temperature above 101 degrees Change in the feeling, color, or movement of your toes Redness, heat, or swelling at your incision  Exercise If is preferred that as soon as possible you try to do a straight leg raise without bending the knee and concentrate on  bringing the heel of your foot off the bed up to approximately 45 degrees and hold for the count of 10 seconds. Repeat this at least 10 times three or four times per day. Additional exercises are provided below.  You are encouraged to bend the knee as tolerated.  Follow-Up Call to schedule a follow-up appointment in 5-7 days. Our office # is [phone].  POST-OP EXERCISES  Short Arc Quads  1. Lie on back with legs straight. Place towel roll under thigh, just above knee. 2. Tighten thigh muscles to straighten knee and lift heel off bed. 3. Hold for slow count of five, then lower. 4. Do three sets of ten    Straight Leg Raises  1. Lie on back with operative leg straight and other leg bent. 2. Keeping operative leg completely straight, slowly lift operative leg so foot is 5 inches off bed. 3. Hold for slow count of five, then lower. 4. Do three sets of ten.    DO BOTH EXERCISES 2 TIMES A DAY  Ankle Pumps  Work/move the operative ankle and foot up and down 10 times every hour while awake.      Post Anesthesia Home Care Instructions  Activity: Get plenty of rest for the remainder of the day. A responsible adult should stay with you for 24 hours following the procedure.  For the next 24 hours, DO NOT: -Drive a car -Paediatric nurse -Drink alcoholic beverages -Take any medication unless instructed by your physician -Make any legal decisions or sign important papers.  Meals: Start with liquid foods such as gelatin or soup. Progress to regular foods as tolerated. Avoid greasy, spicy, heavy foods. If nausea and/or vomiting occur, drink only clear liquids until the nausea and/or vomiting subsides. Call your physician if vomiting continues.  Special Instructions/Symptoms: Your throat may feel dry or sore from the anesthesia or the breathing tube placed in your throat during surgery. If this causes discomfort, gargle with warm salt water. The discomfort should disappear within  24 hours.

## 2014-09-19 NOTE — Anesthesia Procedure Notes (Signed)
Procedure Name: LMA Insertion Date/Time: 09/19/2014 11:11 AM Performed by: Lyndee Leo Pre-anesthesia Checklist: Patient identified, Emergency Drugs available, Suction available and Patient being monitored Patient Re-evaluated:Patient Re-evaluated prior to inductionOxygen Delivery Method: Circle System Utilized Preoxygenation: Pre-oxygenation with 100% oxygen Intubation Type: IV induction Ventilation: Mask ventilation without difficulty LMA: LMA inserted LMA Size: 4.0 Number of attempts: 1 Airway Equipment and Method: bite block Placement Confirmation: positive ETCO2 Tube secured with: Tape Dental Injury: Teeth and Oropharynx as per pre-operative assessment

## 2014-09-19 NOTE — Transfer of Care (Signed)
Immediate Anesthesia Transfer of Care Note  Patient: Marie [name]  Procedure(s) Performed: Procedure(s): RIGHT KNEE ARTHROSCOPY WITH MEDIAL AND LATERAL MENISECTOMIES. CHONDROPLASTY OF PATELLA-FEMORAL JOINT (Right)  Patient Location: PACU  Anesthesia Type:General  Level of Consciousness: awake, sedated and patient cooperative  Airway & Oxygen Therapy: Patient Spontanous Breathing and Patient connected to face mask oxygen  Post-op Assessment: Report given to PACU RN and Post -op Vital signs reviewed and stable  Post vital signs: Reviewed and stable  Complications: No apparent anesthesia complications

## 2014-09-19 NOTE — Anesthesia Postprocedure Evaluation (Signed)
  Anesthesia Post-op Note  Patient: Marie [name]  Procedure(s) Performed: Procedure(s): RIGHT KNEE ARTHROSCOPY WITH MEDIAL AND LATERAL MENISECTOMIES. CHONDROPLASTY OF PATELLA-FEMORAL JOINT (Right)  Patient Location: PACU  Anesthesia Type: General   Level of Consciousness: awake, alert  and oriented  Airway and Oxygen Therapy: Patient Spontanous Breathing  Post-op Pain: mild  Post-op Assessment: Post-op Vital signs reviewed  Post-op Vital Signs: Reviewed  Last Vitals:  Filed Vitals:   09/19/14 1323  BP: 109/46  Pulse: 76  Temp: 36.7 C  Resp: 16    Complications: No apparent anesthesia complications

## 2014-09-19 NOTE — H&P (Addendum)
PREOPERATIVE H&P  Chief Complaint: r knee pain  HPI: Marie [name] is a 78 y.o. female who presents for evaluation of r knee pain. It has been present for greater than 3 months and has been worsening. She has failed conservative measures. Pain is rated as moderate.  Past Medical History  Diagnosis Date  . Incontinence     not indicated at this visit.  . Wears dentures   . Osteoporosis   . Bulging disc   . Hyperlipidemia   . Chronic bronchitis     due to congestion at times, on prednisone and advair  . GERD (gastroesophageal reflux disease)   . Neuropathy     legs stay numb   . Depression     many years ago  . Hypertension     followed by intern med Dr. Marisue Brooklyn [phone]  . Tubulovillous adenoma of rectum   . Balance disorder 2008.      Falls a lot.  She can be standing and then leans too far over to one side   . Wears glasses   . Hearing loss     mild  . Iatrogenic adrenal insufficiency   . Osteoarthritis     hands,    Past Surgical History  Procedure Laterality Date  . Incontinence surgery      multiple procedures, not cured  . Rectal surgery      by dr. Marlou Starks, removal of polyp  . Appendectomy    . Abdominal hysterectomy    . Tonsillectomy    . Eye surgery      bilateral cataract surgery and lens implant  . Dilation and curettage of uterus    . Rectal biopsy  09/21/2011    Procedure: BIOPSY RECTAL;  Surgeon: Merrie Roof, MD;  Location: [facility];  Service: General;  Laterality: N/A;  3-4 cm  . Finger surgery      fusions and debridements for OA  . External fixation leg  10/25/2012    Procedure: EXTERNAL FIXATION LEG;  Surgeon: Rozanna Box, MD;  Location: [facility];  Service: Orthopedics;  Laterality: Right;   History   Social History  . Marital Status: Married    Spouse Name: N/A    Number of Children: N/A  . Years of Education: N/A   Social History Main Topics  . Smoking status: Never Smoker   . Smokeless tobacco: Never Used  . Alcohol Use: No  . Drug  Use: No  . Sexual Activity: None   Other Topics Concern  . None   Social History Narrative   Lives in a house that is mostly single story.  She uses a cane and a walker which she uses for ambulation.  Drives.     Family History  Problem Relation Age of Onset  . Cancer Sister     pt unaware of what kind  . High blood pressure Mother   . COPD Mother   . Heart attack Father    Allergies  Allergen Reactions  . Codeine Other (See Comments)    hallucinations  . Demerol Other (See Comments)    hallucinations  . Morphine And Related Other (See Comments)    unknown  . Other Other (See Comments)    Invan 7- pt became red all over  . Sulfate Other (See Comments)    unknown   Prior to Admission medications   Medication Sig Start Date End Date Taking? Authorizing Provider  acetaminophen (TYLENOL) 325 MG tablet Take 1-2 tablets (  325-650 mg total) by mouth every 6 (six) hours as needed. 10/27/12   Jari Pigg, PA-C  ADVAIR DISKUS 250-50 MCG/DOSE AEPB INHALE ONE PUFF BY MOUTH TWICE DAILY 11/07/13   Melissa R Smith, PA-C  albuterol (PROVENTIL) (2.5 MG/3ML) 0.083% nebulizer solution Take 3 mLs (2.5 mg total) by nebulization every 6 (six) hours as needed for wheezing or shortness of breath. 01/15/14   Vicie Mutters, PA-C  amitriptyline (ELAVIL) 10 MG tablet Take 1 tablet (10 mg total) by mouth 3 (three) times daily. Patient taking differently: Take 10 mg by mouth 2 (two) times daily.  05/31/14   [name], MD  amoxicillin (AMOXIL) 250 MG capsule Take 1 capsule 3 x day after meal for 10 days 09/01/14   [name], MD  aspirin 81 MG tablet Take 81 mg by mouth daily.     Historical Provider, MD  cholecalciferol (VITAMIN D) 1000 UNITS tablet Take 5 tablets (5,000 Units total) by mouth daily. 10/28/12   Radene Gunning, NP  FLUoxetine (PROZAC) 40 MG capsule Take 1 capsule (40 mg total) by mouth daily. 06/01/14 06/01/15  [name], MD  losartan-hydrochlorothiazide (HYZAAR) 100-25 MG per  tablet Take 1 tablet by mouth daily. Patient taking differently: Take 0.5 tablets by mouth daily.  06/01/14   [name], MD  Multiple Vitamin (MULTIVITAMIN) capsule Take 1 capsule by mouth daily.     Historical Provider, MD  nystatin (MYCOSTATIN) 100000 UNIT/ML suspension Swish and spit 5 ml QID for two days after symptoms resolve. 09/10/14   [name], MD  omeprazole (PRILOSEC) 40 MG capsule TAKE ONE CAPSULE BY MOUTH TWICE DAILY FOR ACID REFLUX 06/01/14   [name], MD  oxybutynin (DITROPAN) 5 MG tablet Take 5 mg by mouth daily.  07/13/11   Historical Provider, MD  pravastatin (PRAVACHOL) 40 MG tablet TAKE ONE TABLET BY MOUTH ONCE DAILY FOR CHOLESTEROL 06/01/14   [name], MD  predniSONE (DELTASONE) 10 MG tablet Take 1 tablet (10 mg total) by mouth daily with breakfast. 06/01/14   [name], MD  traMADol (ULTRAM) 50 MG tablet Take 1 tablet (50 mg total) by mouth 4 (four) times daily. Patient taking differently: Take 50 mg by mouth 2 (two) times daily.  06/05/14   [name], MD     Positive ROS: none  All other systems have been reviewed and were otherwise negative with the exception of those mentioned in the HPI and as above.  Physical Exam: Filed Vitals:   09/19/14 0849  BP: 136/59  Pulse: 71  Temp: 97.8 F (36.6 C)  Resp: 18    General: Alert, no acute distress Cardiovascular: No pedal edema Respiratory: No cyanosis, no use of accessory musculature GI: No organomegaly, abdomen is soft and non-tender Skin: No lesions in the area of chief complaint Neurologic: Sensation intact distally Psychiatric: Patient is competent for consent with normal mood and affect Lymphatic: No axillary or cervical lymphadenopathy  MUSCULOSKELETAL: r knee ttp over med and Lat  jt lines and + Mcmurray  Assessment/Plan: RIGHT KNEE MEDIAL AND LATERAL TEARS Plan for Procedure(s): RIGHT KNEE ARTHROSCOPY  The risks benefits and alternatives were discussed with the  patient including but not limited to the risks of nonoperative treatment, versus surgical intervention including infection, bleeding, nerve injury, malunion, nonunion, hardware prominence, hardware failure, need for hardware removal, blood clots, cardiopulmonary complications, morbidity, mortality, among others, and they were willing to proceed.  Predicted outcome is good, although there will be at least a six to nine month expected  recovery.  Tatiyanna Lashley L, MD 09/19/2014 10:21 AM

## 2014-09-19 NOTE — Anesthesia Preprocedure Evaluation (Addendum)
Anesthesia Evaluation  Patient identified by MRN, date of birth, ID band Patient awake    Reviewed: Allergy & Precautions, H&P , NPO status , Patient's Chart, lab work & pertinent test results  Airway Mallampati: II   Neck ROM: full    Dental   Pulmonary COPD         Cardiovascular hypertension,     Neuro/Psych Depression  Neuromuscular disease    GI/Hepatic GERD-  ,  Endo/Other    Renal/GU      Musculoskeletal  (+) Arthritis -,   Abdominal   Peds  Hematology   Anesthesia Other Findings   Reproductive/Obstetrics                            Anesthesia Physical Anesthesia Plan  ASA: II  Anesthesia Plan: General   Post-op Pain Management:    Induction: Intravenous  Airway Management Planned: LMA  Additional Equipment:   Intra-op Plan:   Post-operative Plan:   Informed Consent: I have reviewed the patients History and Physical, chart, labs and discussed the procedure including the risks, benefits and alternatives for the proposed anesthesia with the patient or authorized representative who has indicated his/her understanding and acceptance.     Plan Discussed with: CRNA, Anesthesiologist and Surgeon  Anesthesia Plan Comments:         Anesthesia Quick Evaluation

## 2014-09-20 NOTE — Op Note (Signed)
[name], Marie [name] NO.:  [id number]  MEDICAL RECORD NO.:  [id number]  LOCATION:                                 FACILITY:  PHYSICIAN:  Alta Corning, M.D.   DATE OF BIRTH:  06-Jul-1936  DATE OF PROCEDURE:  09/19/2014 DATE OF DISCHARGE:  09/19/2014                              OPERATIVE REPORT   PREOPERATIVE DIAGNOSIS:  Medial and lateral meniscal tears with some chondral injury medially.  PREOPERATIVE DIAGNOSES: 1. Medial and lateral meniscal tears with some chondral injury     medially. 2. Chondromalacia of patellofemoral joint. 3. Multiple cartilaginous loose bodies.  PRINCIPAL PROCEDURES: 1. Partial medial and partial lateral meniscectomy with corresponding     debridement of medial and lateral compartments. 2. Removal of multiple chondral loose bodies. 3. Chondromalacia of the patellofemoral joint.  SURGEON:  Alta Corning, M.D.  ASSISTANT:  Gary Fleet, P.A.  ANESTHESIA:  General.  BRIEF HISTORY:  [name] is a 78 year old female with long history of significant complaints of right knee pain.  She had been treated conservatively for period of time after failure of all conservative care.  MRI was obtained, which showed she had medial and lateral meniscus tears as well as chondromalacia after discussion of treatment options including the possibility of anti-inflammatory medication and after failure of all conservative care including injection therapy.  The patient was taken to the operating room for right knee arthroscopy.  DESCRIPTION OF PROCEDURE:  The patient was taken to the operative room. After adequate anesthesia was obtained with general anesthetic, the patient was placed supine on the operating table.  The right leg was prepped and draped in usual sterile fashion.  Following this routine arthroscopic examination, the knee revealed there was obvious large medial shelf plica, which was debrided back to a smooth and stable  rim. There was significant chondromalacia of the patellofemoral joint, which debrided back to a smooth and stable rim.  There was grade 2 and grade 3 changes throughout.  Attention was turned towards the medial compartment where there was significant chondromalacia of the femur and there was multiple areas where there was flaps of cartilage, which still attached, but undermined.  At this point, these flaps were debrided.  There were multiple cartilaginous loose bodies in the medial compartment, which were debrided.  There was a medial meniscus tear in the midbody extending around posteriorly, which was debrided.  Attention was turned to the ACL, normal.  Lateral side had midbody and posterolateral meniscal tear with some chondromalacia of the lateral femoral condyle and lateral tibial plateau, which were debrided.  At this point, the final check was made for loosen fragment and pieces seeing a couple of areas of loose cartilage, which were debrided with a suction shaver. Attention was then turned back to the patellofemoral joint, which debrided down to the medial plica was debrided.  At this point, the knee was copiously and thoroughly lavaged, suctioned dry.  The arthroscopic portals were closed with bandage. Sterile compressive dressing was applied.  After 20 mL of 0.25% Marcaine plain was instilled in the knee for postoperative anesthesia.     Alta Corning, M.D.     Corliss Skains  D:  09/19/2014  T:  09/19/2014  Job:  [id number]

## 2014-09-21 ENCOUNTER — Encounter (HOSPITAL_BASED_OUTPATIENT_CLINIC_OR_DEPARTMENT_OTHER): Payer: Self-pay | Admitting: Orthopedic Surgery

## 2014-09-24 ENCOUNTER — Other Ambulatory Visit: Payer: Self-pay | Admitting: *Deleted

## 2014-09-24 MED ORDER — OXYBUTYNIN CHLORIDE 5 MG PO TABS: 5.0000 mg | ORAL_TABLET | Freq: Every day | ORAL | Status: DC

## 2014-09-26 DIAGNOSIS — R262 Difficulty in walking, not elsewhere classified: Secondary | ICD-10-CM | POA: Diagnosis not present

## 2014-09-26 DIAGNOSIS — M25561 Pain in right knee: Secondary | ICD-10-CM | POA: Diagnosis not present

## 2014-09-26 DIAGNOSIS — S83241D Other tear of medial meniscus, current injury, right knee, subsequent encounter: Secondary | ICD-10-CM | POA: Diagnosis not present

## 2014-09-26 DIAGNOSIS — S83281D Other tear of lateral meniscus, current injury, right knee, subsequent encounter: Secondary | ICD-10-CM | POA: Diagnosis not present

## 2014-10-03 ENCOUNTER — Ambulatory Visit: Payer: Medicare Other

## 2014-10-03 ENCOUNTER — Encounter (HOSPITAL_BASED_OUTPATIENT_CLINIC_OR_DEPARTMENT_OTHER): Payer: Self-pay | Admitting: Orthopedic Surgery

## 2014-10-03 DIAGNOSIS — M25561 Pain in right knee: Secondary | ICD-10-CM | POA: Diagnosis not present

## 2014-10-03 DIAGNOSIS — S83241D Other tear of medial meniscus, current injury, right knee, subsequent encounter: Secondary | ICD-10-CM | POA: Diagnosis not present

## 2014-10-03 DIAGNOSIS — R262 Difficulty in walking, not elsewhere classified: Secondary | ICD-10-CM | POA: Diagnosis not present

## 2014-10-03 DIAGNOSIS — S83281D Other tear of lateral meniscus, current injury, right knee, subsequent encounter: Secondary | ICD-10-CM | POA: Diagnosis not present

## 2014-10-16 DIAGNOSIS — D128 Benign neoplasm of rectum: Secondary | ICD-10-CM | POA: Diagnosis not present

## 2014-10-24 DIAGNOSIS — B351 Tinea unguium: Secondary | ICD-10-CM | POA: Diagnosis not present

## 2014-10-24 DIAGNOSIS — M2040 Other hammer toe(s) (acquired), unspecified foot: Secondary | ICD-10-CM | POA: Diagnosis not present

## 2014-10-24 DIAGNOSIS — M79675 Pain in left toe(s): Secondary | ICD-10-CM | POA: Diagnosis not present

## 2014-10-24 DIAGNOSIS — M2010 Hallux valgus (acquired), unspecified foot: Secondary | ICD-10-CM | POA: Diagnosis not present

## 2014-10-26 DIAGNOSIS — K621 Rectal polyp: Secondary | ICD-10-CM | POA: Diagnosis not present

## 2014-11-06 DIAGNOSIS — K621 Rectal polyp: Secondary | ICD-10-CM | POA: Diagnosis not present

## 2014-11-12 ENCOUNTER — Emergency Department (HOSPITAL_COMMUNITY): Payer: Medicare Other

## 2014-11-12 ENCOUNTER — Inpatient Hospital Stay (HOSPITAL_COMMUNITY)
Admission: EM | Admit: 2014-11-12 | Discharge: 2014-11-16 | DRG: 291 | Disposition: A | Discharge status: Home / Self Care | Payer: Medicare Other | Attending: Internal Medicine | Admitting: Internal Medicine

## 2014-11-12 ENCOUNTER — Encounter (HOSPITAL_COMMUNITY): Payer: Self-pay | Admitting: General Practice

## 2014-11-12 DIAGNOSIS — J189 Pneumonia, unspecified organism: Secondary | ICD-10-CM | POA: Diagnosis present

## 2014-11-12 DIAGNOSIS — E273 Drug-induced adrenocortical insufficiency: Secondary | ICD-10-CM | POA: Diagnosis not present

## 2014-11-12 DIAGNOSIS — J45901 Unspecified asthma with (acute) exacerbation: Secondary | ICD-10-CM | POA: Diagnosis present

## 2014-11-12 DIAGNOSIS — R0602 Shortness of breath: Secondary | ICD-10-CM

## 2014-11-12 DIAGNOSIS — E876 Hypokalemia: Secondary | ICD-10-CM

## 2014-11-12 DIAGNOSIS — J9811 Atelectasis: Secondary | ICD-10-CM | POA: Diagnosis not present

## 2014-11-12 DIAGNOSIS — G629 Polyneuropathy, unspecified: Secondary | ICD-10-CM | POA: Diagnosis present

## 2014-11-12 DIAGNOSIS — R269 Unspecified abnormalities of gait and mobility: Secondary | ICD-10-CM

## 2014-11-12 DIAGNOSIS — R0600 Dyspnea, unspecified: Secondary | ICD-10-CM | POA: Diagnosis not present

## 2014-11-12 DIAGNOSIS — K219 Gastro-esophageal reflux disease without esophagitis: Secondary | ICD-10-CM | POA: Diagnosis present

## 2014-11-12 DIAGNOSIS — Z7982 Long term (current) use of aspirin: Secondary | ICD-10-CM

## 2014-11-12 DIAGNOSIS — Z7952 Long term (current) use of systemic steroids: Secondary | ICD-10-CM

## 2014-11-12 DIAGNOSIS — J9601 Acute respiratory failure with hypoxia: Secondary | ICD-10-CM | POA: Diagnosis present

## 2014-11-12 DIAGNOSIS — J42 Unspecified chronic bronchitis: Secondary | ICD-10-CM | POA: Diagnosis present

## 2014-11-12 DIAGNOSIS — I5033 Acute on chronic diastolic (congestive) heart failure: Principal | ICD-10-CM | POA: Diagnosis present

## 2014-11-12 DIAGNOSIS — N179 Acute kidney failure, unspecified: Secondary | ICD-10-CM | POA: Diagnosis not present

## 2014-11-12 DIAGNOSIS — J441 Chronic obstructive pulmonary disease with (acute) exacerbation: Secondary | ICD-10-CM

## 2014-11-12 DIAGNOSIS — Z885 Allergy status to narcotic agent status: Secondary | ICD-10-CM

## 2014-11-12 DIAGNOSIS — M810 Age-related osteoporosis without current pathological fracture: Secondary | ICD-10-CM | POA: Diagnosis present

## 2014-11-12 DIAGNOSIS — F329 Major depressive disorder, single episode, unspecified: Secondary | ICD-10-CM | POA: Diagnosis present

## 2014-11-12 DIAGNOSIS — J440 Chronic obstructive pulmonary disease with acute lower respiratory infection: Secondary | ICD-10-CM | POA: Diagnosis present

## 2014-11-12 DIAGNOSIS — I10 Essential (primary) hypertension: Secondary | ICD-10-CM | POA: Diagnosis present

## 2014-11-12 DIAGNOSIS — R9431 Abnormal electrocardiogram [ECG] [EKG]: Secondary | ICD-10-CM | POA: Diagnosis present

## 2014-11-12 DIAGNOSIS — H9190 Unspecified hearing loss, unspecified ear: Secondary | ICD-10-CM | POA: Diagnosis present

## 2014-11-12 DIAGNOSIS — E860 Dehydration: Secondary | ICD-10-CM | POA: Diagnosis not present

## 2014-11-12 DIAGNOSIS — R404 Transient alteration of awareness: Secondary | ICD-10-CM | POA: Diagnosis not present

## 2014-11-12 DIAGNOSIS — E785 Hyperlipidemia, unspecified: Secondary | ICD-10-CM | POA: Diagnosis present

## 2014-11-12 DIAGNOSIS — M1990 Unspecified osteoarthritis, unspecified site: Secondary | ICD-10-CM | POA: Diagnosis present

## 2014-11-12 DIAGNOSIS — R531 Weakness: Secondary | ICD-10-CM | POA: Diagnosis not present

## 2014-11-12 DIAGNOSIS — Z882 Allergy status to sulfonamides status: Secondary | ICD-10-CM

## 2014-11-12 HISTORY — DX: Reserved for inherently not codable concepts without codable children: IMO0001

## 2014-11-12 HISTORY — DX: Chronic obstructive pulmonary disease, unspecified: J44.9

## 2014-11-12 LAB — BASIC METABOLIC PANEL
ANION GAP: 10 (ref 5–15)
BUN: 21 mg/dL (ref 6–23)
CHLORIDE: 99 mmol/L (ref 96–112)
CO2: 32 mmol/L (ref 19–32)
Calcium: 9 mg/dL (ref 8.4–10.5)
Creatinine, Ser: 1.31 mg/dL — ABNORMAL HIGH (ref 0.50–1.10)
GFR calc Af Amer: 45 mL/min — ABNORMAL LOW (ref >90)
GFR calc non Af Amer: 38 mL/min — ABNORMAL LOW (ref >90)
GLUCOSE: 132 mg/dL — AB (ref 70–99)
POTASSIUM: 3.3 mmol/L — AB (ref 3.5–5.1)
Sodium: 141 mmol/L (ref 135–145)

## 2014-11-12 LAB — CBC WITH DIFFERENTIAL/PLATELET
BASOS ABS: 0.1 10*3/uL (ref 0.0–0.1)
Basophils Relative: 1 % (ref 0–1)
EOS ABS: 0.7 10*3/uL (ref 0.0–0.7)
Eosinophils Relative: 7 % — ABNORMAL HIGH (ref 0–5)
HEMATOCRIT: 47.9 % — AB (ref 36.0–46.0)
Hemoglobin: 15.9 g/dL — ABNORMAL HIGH (ref 12.0–15.0)
LYMPHS PCT: 29 % (ref 12–46)
Lymphs Abs: 2.7 10*3/uL (ref 0.7–4.0)
MCH: 31.5 pg (ref 26.0–34.0)
MCHC: 33.2 g/dL (ref 30.0–36.0)
MCV: 95 fL (ref 78.0–100.0)
MONOS PCT: 10 % (ref 3–12)
Monocytes Absolute: 0.9 10*3/uL (ref 0.1–1.0)
Neutro Abs: 4.9 10*3/uL (ref 1.7–7.7)
Neutrophils Relative %: 53 % (ref 43–77)
PLATELETS: 213 10*3/uL (ref 150–400)
RBC: 5.04 MIL/uL (ref 3.87–5.11)
RDW: 13.8 % (ref 11.5–15.5)
WBC: 9.3 10*3/uL (ref 4.0–10.5)

## 2014-11-12 LAB — INFLUENZA PANEL BY PCR (TYPE A & B)
H1N1FLUPCR: NOT DETECTED
INFLBPCR: NEGATIVE
Influenza A By PCR: NEGATIVE

## 2014-11-12 LAB — CREATININE, SERUM
Creatinine, Ser: 1.14 mg/dL — ABNORMAL HIGH (ref 0.50–1.10)
GFR calc Af Amer: 53 mL/min — ABNORMAL LOW (ref >90)
GFR calc non Af Amer: 46 mL/min — ABNORMAL LOW (ref >90)

## 2014-11-12 LAB — CBC
HEMATOCRIT: 44.9 % (ref 36.0–46.0)
HEMOGLOBIN: 14.6 g/dL (ref 12.0–15.0)
MCH: 30.9 pg (ref 26.0–34.0)
MCHC: 32.5 g/dL (ref 30.0–36.0)
MCV: 94.9 fL (ref 78.0–100.0)
PLATELETS: 187 10*3/uL (ref 150–400)
RBC: 4.73 MIL/uL (ref 3.87–5.11)
RDW: 13.7 % (ref 11.5–15.5)
WBC: 7.4 10*3/uL (ref 4.0–10.5)

## 2014-11-12 MED ORDER — AZITHROMYCIN 500 MG IV SOLR
500.0000 mg | INTRAVENOUS | Status: DC
  Administered 2014-11-12 – 2014-11-13 (×2): 500 mg via INTRAVENOUS
  Filled 2014-11-12 (×2): qty 500

## 2014-11-12 MED ORDER — ALBUTEROL (5 MG/ML) CONTINUOUS INHALATION SOLN
10.0000 mg/h | INHALATION_SOLUTION | Freq: Once | RESPIRATORY_TRACT | Status: AC
  Administered 2014-11-12: 10 mg/h via RESPIRATORY_TRACT
  Filled 2014-11-12: qty 20

## 2014-11-12 MED ORDER — ALBUTEROL SULFATE (2.5 MG/3ML) 0.083% IN NEBU
2.5000 mg | INHALATION_SOLUTION | Freq: Once | RESPIRATORY_TRACT | Status: AC
  Administered 2014-11-12: 2.5 mg via RESPIRATORY_TRACT
  Filled 2014-11-12: qty 3

## 2014-11-12 MED ORDER — SODIUM CHLORIDE 0.9 % IV SOLN
INTRAVENOUS | Status: DC
  Administered 2014-11-12: 15:00:00 via INTRAVENOUS

## 2014-11-12 MED ORDER — CEFTRIAXONE SODIUM IN DEXTROSE 20 MG/ML IV SOLN
1.0000 g | INTRAVENOUS | Status: DC
  Administered 2014-11-12 – 2014-11-13 (×2): 1 g via INTRAVENOUS
  Filled 2014-11-12 (×3): qty 50

## 2014-11-12 MED ORDER — ACETAMINOPHEN 325 MG PO TABS: 650.0000 mg | ORAL_TABLET | Freq: Four times a day (QID) | ORAL | Status: DC | PRN

## 2014-11-12 MED ORDER — PREDNISONE 10 MG PO TABS
60.0000 mg | ORAL_TABLET | Freq: Every day | ORAL | Status: DC
  Filled 2014-11-12: qty 1

## 2014-11-12 MED ORDER — FLUOXETINE HCL 40 MG PO CAPS: 40.0000 mg | ORAL_CAPSULE | Freq: Every day | ORAL | Status: DC

## 2014-11-12 MED ORDER — ONDANSETRON HCL 4 MG/2ML IJ SOLN: 4.0000 mg | Freq: Four times a day (QID) | INTRAMUSCULAR | Status: DC | PRN

## 2014-11-12 MED ORDER — FLUOXETINE HCL 20 MG PO CAPS
40.0000 mg | ORAL_CAPSULE | Freq: Every day | ORAL | Status: DC
  Administered 2014-11-12: 40 mg via ORAL
  Filled 2014-11-12 (×2): qty 2

## 2014-11-12 MED ORDER — HEPARIN SODIUM (PORCINE) 5000 UNIT/ML IJ SOLN
5000.0000 [IU] | Freq: Three times a day (TID) | INTRAMUSCULAR | Status: DC
  Administered 2014-11-12 – 2014-11-16 (×12): 5000 [IU] via SUBCUTANEOUS
  Filled 2014-11-12 (×13): qty 1

## 2014-11-12 MED ORDER — POTASSIUM CHLORIDE CRYS ER 20 MEQ PO TBCR
40.0000 meq | EXTENDED_RELEASE_TABLET | Freq: Once | ORAL | Status: AC
  Administered 2014-11-12: 40 meq via ORAL
  Filled 2014-11-12: qty 2

## 2014-11-12 MED ORDER — ALBUTEROL SULFATE (2.5 MG/3ML) 0.083% IN NEBU
2.5000 mg | INHALATION_SOLUTION | Freq: Four times a day (QID) | RESPIRATORY_TRACT | Status: DC
  Administered 2014-11-12 – 2014-11-13 (×4): 2.5 mg via RESPIRATORY_TRACT
  Filled 2014-11-12 (×5): qty 3

## 2014-11-12 MED ORDER — ALBUTEROL SULFATE (2.5 MG/3ML) 0.083% IN NEBU
5.0000 mg | INHALATION_SOLUTION | Freq: Once | RESPIRATORY_TRACT | Status: AC
  Administered 2014-11-12: 5 mg via RESPIRATORY_TRACT
  Filled 2014-11-12: qty 6

## 2014-11-12 MED ORDER — SODIUM CHLORIDE 0.9 % IJ SOLN
3.0000 mL | Freq: Two times a day (BID) | INTRAMUSCULAR | Status: DC
  Administered 2014-11-12 – 2014-11-15 (×5): 3 mL via INTRAVENOUS

## 2014-11-12 MED ORDER — ACETAMINOPHEN 650 MG RE SUPP: 650.0000 mg | Freq: Four times a day (QID) | RECTAL | Status: DC | PRN

## 2014-11-12 MED ORDER — POTASSIUM CHLORIDE CRYS ER 20 MEQ PO TBCR
20.0000 meq | EXTENDED_RELEASE_TABLET | Freq: Two times a day (BID) | ORAL | Status: DC
  Administered 2014-11-12 – 2014-11-16 (×9): 20 meq via ORAL
  Filled 2014-11-12 (×10): qty 1

## 2014-11-12 MED ORDER — ONDANSETRON HCL 4 MG PO TABS: 4.0000 mg | ORAL_TABLET | Freq: Four times a day (QID) | ORAL | Status: DC | PRN

## 2014-11-12 MED ORDER — MOMETASONE FURO-FORMOTEROL FUM 100-5 MCG/ACT IN AERO
2.0000 | INHALATION_SPRAY | Freq: Two times a day (BID) | RESPIRATORY_TRACT | Status: DC
  Administered 2014-11-12 – 2014-11-16 (×8): 2 via RESPIRATORY_TRACT
  Filled 2014-11-12: qty 8.8

## 2014-11-12 MED ORDER — PREDNISONE 50 MG PO TABS
60.0000 mg | ORAL_TABLET | Freq: Every day | ORAL | Status: DC
  Administered 2014-11-12 – 2014-11-13 (×2): 60 mg via ORAL
  Filled 2014-11-12 (×4): qty 1

## 2014-11-12 MED ORDER — ALUM & MAG HYDROXIDE-SIMETH 200-200-20 MG/5ML PO SUSP
30.0000 mL | Freq: Four times a day (QID) | ORAL | Status: DC | PRN
  Filled 2014-11-12: qty 30

## 2014-11-12 MED ORDER — SODIUM CHLORIDE 0.9 % IV BOLUS (SEPSIS)
1000.0000 mL | Freq: Once | INTRAVENOUS | Status: AC
  Administered 2014-11-12: 1000 mL via INTRAVENOUS

## 2014-11-12 MED ORDER — IPRATROPIUM-ALBUTEROL 0.5-2.5 (3) MG/3ML IN SOLN
3.0000 mL | Freq: Once | RESPIRATORY_TRACT | Status: AC
  Administered 2014-11-12: 3 mL via RESPIRATORY_TRACT
  Filled 2014-11-12: qty 3

## 2014-11-12 MED ORDER — METOPROLOL SUCCINATE 12.5 MG HALF TABLET
12.5000 mg | ORAL_TABLET | Freq: Two times a day (BID) | ORAL | Status: DC
  Administered 2014-11-12 – 2014-11-16 (×8): 12.5 mg via ORAL
  Filled 2014-11-12 (×11): qty 1

## 2014-11-12 MED ORDER — PREDNISONE 20 MG PO TABS
60.0000 mg | ORAL_TABLET | Freq: Once | ORAL | Status: AC
  Administered 2014-11-12: 60 mg via ORAL
  Filled 2014-11-12: qty 3

## 2014-11-12 MED ORDER — AMITRIPTYLINE HCL 10 MG PO TABS
10.0000 mg | ORAL_TABLET | Freq: Two times a day (BID) | ORAL | Status: DC
  Administered 2014-11-12 – 2014-11-16 (×9): 10 mg via ORAL
  Filled 2014-11-12 (×11): qty 1

## 2014-11-12 MED ORDER — OXYBUTYNIN CHLORIDE 5 MG PO TABS
2.5000 mg | ORAL_TABLET | Freq: Every day | ORAL | Status: DC
  Administered 2014-11-12: 2.5 mg via ORAL
  Filled 2014-11-12 (×2): qty 0.5

## 2014-11-12 NOTE — ED Provider Notes (Signed)
CSN: [id number]     Arrival date & time 11/12/14  0720 History   First MD Initiated Contact with Patient 11/12/14 [phone]     Chief Complaint  Patient presents with  . Weakness    Patient is a 79 y.o. female presenting with cough. The history is provided by the patient and the spouse.  Cough Cough characteristics:  Productive Severity:  Moderate Onset quality:  Gradual Duration:  1 week Timing:  Intermittent Progression:  Worsening Chronicity:  New Smoker: no   Relieved by:  Nothing Worsened by:  Activity Associated symptoms: shortness of breath and wheezing   Associated symptoms: no chest pain, no fever and no headaches   Patient reports cough for one week  - she reports productive sputum mixed with blood No CP She report SOB She also reports generalized fatigue/weakness  Per husband, pt had worsened coughing this morning.  Also she has had progressive weakness/fatigue for "awhile" but not necessarily related to her cough. He reports this morning her checked her blood pressure and it "was low" but unsure if the cuff was working properly    Past Medical History  Diagnosis Date  . Incontinence     not indicated at this visit.  . Wears dentures   . Osteoporosis   . Bulging disc   . Hyperlipidemia   . Chronic bronchitis     due to congestion at times, on prednisone and advair  . GERD (gastroesophageal reflux disease)   . Neuropathy     legs stay numb   . Depression     many years ago  . Hypertension     followed by intern med Dr. Marisue Brooklyn [phone]  . Tubulovillous adenoma of rectum   . Balance disorder 2008.      Falls a lot.  She can be standing and then leans too far over to one side   . Wears glasses   . Hearing loss     mild  . Iatrogenic adrenal insufficiency   . Osteoarthritis     hands,    Past Surgical History  Procedure Laterality Date  . Incontinence surgery      multiple procedures, not cured  . Rectal surgery      by dr. Marlou Starks, removal of polyp  .  Appendectomy    . Abdominal hysterectomy    . Tonsillectomy    . Eye surgery      bilateral cataract surgery and lens implant  . Dilation and curettage of uterus    . Rectal biopsy  09/21/2011    Procedure: BIOPSY RECTAL;  Surgeon: Merrie Roof, MD;  Location: [facility];  Service: General;  Laterality: N/A;  3-4 cm  . Finger surgery      fusions and debridements for OA  . External fixation leg  10/25/2012    Procedure: EXTERNAL FIXATION LEG;  Surgeon: Rozanna Box, MD;  Location: [facility];  Service: Orthopedics;  Laterality: Right;  . Knee arthroscopy with medial menisectomy Right 09/19/2014    Procedure: RIGHT KNEE ARTHROSCOPY WITH MEDIAL AND LATERAL MENISECTOMIES. CHONDROPLASTY OF PATELLA-FEMORAL JOINT;  Surgeon: Alta Corning, MD;  Location: [facility];  Service: Orthopedics;  Laterality: Right;  . Knee arthroscopy with lateral menisectomy Right 09/19/2014    Procedure: KNEE ARTHROSCOPY WITH LATERAL MENISECTOMY;  Surgeon: Alta Corning, MD;  Location: [facility];  Service: Orthopedics;  Laterality: Right;  . Chondroplasty  09/19/2014    Procedure: CHONDROPLASTY;  Surgeon: Alta Corning, MD;  Location: [facility];  Service: Orthopedics;;   Family History  Problem Relation Age of Onset  . Cancer Sister     pt unaware of what kind  . High blood pressure Mother   . COPD Mother   . Heart attack Father    History  Substance Use Topics  . Smoking status: Never Smoker   . Smokeless tobacco: Never Used  . Alcohol Use: No   OB History    No data available     Review of Systems  Constitutional: Positive for fatigue. Negative for fever.  Respiratory: Positive for cough, shortness of breath and wheezing.   Cardiovascular: Negative for chest pain.  Gastrointestinal: Negative for vomiting.  Neurological: Positive for weakness. Negative for headaches.  All other systems reviewed and are negative.     Allergies  Codeine; Demerol; Morphine and  related; Other; and Sulfate  Home Medications   Prior to Admission medications   Medication Sig Start Date End Date Taking? Authorizing Provider  acetaminophen (TYLENOL) 325 MG tablet Take 1-2 tablets (325-650 mg total) by mouth every 6 (six) hours as needed. 10/27/12   Jari Pigg, PA-C  ADVAIR DISKUS 250-50 MCG/DOSE AEPB INHALE ONE PUFF BY MOUTH TWICE DAILY 11/07/13   Melissa R Smith, PA-C  albuterol (PROVENTIL) (2.5 MG/3ML) 0.083% nebulizer solution Take 3 mLs (2.5 mg total) by nebulization every 6 (six) hours as needed for wheezing or shortness of breath. 01/15/14   Vicie Mutters, PA-C  amitriptyline (ELAVIL) 10 MG tablet Take 1 tablet (10 mg total) by mouth 3 (three) times daily. Patient taking differently: Take 10 mg by mouth 2 (two) times daily.  05/31/14   [name], MD  amoxicillin (AMOXIL) 250 MG capsule Take 1 capsule 3 x day after meal for 10 days 09/01/14   [name], MD  aspirin 81 MG tablet Take 81 mg by mouth daily.     Historical Provider, MD  cholecalciferol (VITAMIN D) 1000 UNITS tablet Take 5 tablets (5,000 Units total) by mouth daily. 10/28/12   Radene Gunning, NP  FLUoxetine (PROZAC) 40 MG capsule Take 1 capsule (40 mg total) by mouth daily. 06/01/14 06/01/15  [name], MD  HYDROcodone-acetaminophen (NORCO) 5-325 MG per tablet Take 1-2 tablets by mouth every 6 (six) hours as needed for moderate pain. 09/19/14   Erlene Senters, PA-C  losartan-hydrochlorothiazide (HYZAAR) 100-25 MG per tablet Take 1 tablet by mouth daily. Patient taking differently: Take 0.5 tablets by mouth daily.  06/01/14   [name], MD  Multiple Vitamin (MULTIVITAMIN) capsule Take 1 capsule by mouth daily.     Historical Provider, MD  nystatin (MYCOSTATIN) 100000 UNIT/ML suspension Swish and spit 5 ml QID for two days after symptoms resolve. 09/10/14   [name], MD  omeprazole (PRILOSEC) 40 MG capsule TAKE ONE CAPSULE BY MOUTH TWICE DAILY FOR ACID REFLUX 06/01/14   [name],  MD  oxybutynin (DITROPAN) 5 MG tablet Take 1 tablet (5 mg total) by mouth daily. 09/24/14   [name], MD  pravastatin (PRAVACHOL) 40 MG tablet TAKE ONE TABLET BY MOUTH ONCE DAILY FOR CHOLESTEROL 06/01/14   [name], MD  predniSONE (DELTASONE) 10 MG tablet Take 1 tablet (10 mg total) by mouth daily with breakfast. 06/01/14   [name], MD  traMADol (ULTRAM) 50 MG tablet Take 1-2 tablets (50-100 mg total) by mouth every 6 (six) hours as needed for moderate pain. 09/19/14   Erlene Senters, PA-C   BP 101/51 mmHg  Pulse 95  Temp(Src) 98.4 F (36.9 C) (Oral)  Resp 20  Ht 5\' 4"  (1.626 m)  Wt 170 lb (77.111 kg)  BMI 29.17 kg/m2  SpO2 95% Physical Exam CONSTITUTIONAL: Well developed/well nourished HEAD: Normocephalic/atraumatic EYES: EOMI/PERRL ENMT: Mucous membranes moist NECK: supple no meningeal signs SPINE/BACK:entire spine nontender CV: P3/X9 noted, systolic ejection murmur noted (3/6) LUNGS: mild tachypnea noted, wheezing bilaterally ABDOMEN: soft, nontender, no rebound or guarding, bowel sounds noted throughout abdomen GU:no cva tenderness NEURO: Pt is awake/alert/appropriate, moves all extremitiesx4.  No facial droop.   EXTREMITIES: pulses normal/equal, full ROM SKIN: warm, color normal PSYCH: no abnormalities of mood noted, alert and oriented to situation  ED Course  Procedures  CRITICAL CARE Performed by: Sharyon Cable Total critical care time: 33 Critical care time was exclusive of separately billable procedures and treating other patients. Critical care was necessary to treat or prevent imminent or life-threatening deterioration. Critical care was time spent personally by me on the following activities: development of treatment plan with patient and/or surrogate as well as nursing, discussions with consultants, evaluation of patient's response to treatment, examination of patient, obtaining history from patient or surrogate, ordering and performing  treatments and interventions, ordering and review of laboratory studies, ordering and review of radiographic studies, pulse oximetry and re-evaluation of patient's condition. PATIENT REQUIRED 3 NEBULIZED TREATMENTS AS WELL AS ORAL STEROIDS SHE STILL HAD SIGNIFICANT WHEEZING AND ALSO DYSPNEA ON EXERTION WILL NEED ADMISSION   11:05 AM D/W DR Scherrie Gerlach, WILL ADMIT TO TRAID   Labs Review Labs Reviewed  BASIC METABOLIC PANEL - Abnormal; Notable for the following:    Potassium 3.3 (*)    Glucose, Bld 132 (*)    Creatinine, Ser 1.31 (*)    GFR calc non Af Amer 38 (*)    GFR calc Af Amer 45 (*)    All other components within normal limits  CBC WITH DIFFERENTIAL/PLATELET - Abnormal; Notable for the following:    Hemoglobin 15.9 (*)    HCT 47.9 (*)    Eosinophils Relative 7 (*)    All other components within normal limits    Imaging Review Dg Chest Portable 1 View  11/12/2014   CLINICAL DATA:  Dyspnea with cough and congestion. Initial encounter.  EXAM: PORTABLE CHEST - 1 VIEW  COMPARISON:  09/03/2014 and 01/15/2014 radiographs.  FINDINGS: 0736 hr. There are lower lung volumes with mild resulting bibasilar atelectasis. The lungs are otherwise clear. The heart size and mediastinal contours are stable. There is no pleural effusion or pneumothorax. The bones appear unremarkable. Telemetry leads overlie the chest.  IMPRESSION: Mild bibasilar atelectasis.  Otherwise stable examination.   Electronically Signed   By: Camie Patience M.D.   On: 11/12/2014 07:51     EKG Interpretation   Date/Time:  Monday November 12 2014 07:35:53 EST Ventricular Rate:  95 PR Interval:  160 QRS Duration: 88 QT Interval:  398 QTC Calculation: 500 R Axis:   -33 Text Interpretation:  Sinus rhythm Probable left atrial enlargement  Inferior infarct, old Prolonged QT interval Confirmed by Christy Gentles  MD,  Evelynne Spiers ([id number]) on 11/12/2014 7:47:56 AM     Medications  albuterol (PROVENTIL,VENTOLIN) solution continuous neb (not  administered)  ipratropium-albuterol (DUONEB) 0.5-2.5 (3) MG/3ML nebulizer solution 3 mL (3 mLs Nebulization Given 11/12/14 0746)  albuterol (PROVENTIL) (2.5 MG/3ML) 0.083% nebulizer solution 2.5 mg (2.5 mg Nebulization Given 11/12/14 0746)  predniSONE (DELTASONE) tablet 60 mg (60 mg Oral Given 11/12/14 0744)  sodium chloride 0.9 % bolus 1,000 mL (0 mLs Intravenous  Stopped 11/12/14 1048)  albuterol (PROVENTIL) (2.5 MG/3ML) 0.083% nebulizer solution 5 mg (5 mg Nebulization Given 11/12/14 0837)  potassium chloride SA (K-DUR,KLOR-CON) CR tablet 40 mEq (40 mEq Oral Given 11/12/14 0925)    MDM   Final diagnoses:  Dyspnea  COPD with acute exacerbation  Hypokalemia    xrays/imaging reviewed by myself and considered during evaluation Nursing notes including past medical history and social history reviewed and considered in documentation Labs/vital reviewed myself and considered during evaluation     Sharyon Cable, MD 11/12/14 1106

## 2014-11-12 NOTE — H&P (Signed)
[facility] History and Physical                                                                                    Patient Demographics  Myleen Brailsford, is a 79 y.o. female  MRN: [id number]   DOB - 1936/07/08  Admit Date - 11/12/2014  Outpatient Primary MD for the patient is Alesia Richards, MD  With History of -   Past Medical History  Diagnosis Date  . Incontinence     not indicated at this visit.  . Wears dentures   . Osteoporosis   . Bulging disc   . Hyperlipidemia   . Chronic bronchitis     due to congestion at times, on prednisone and advair  . GERD (gastroesophageal reflux disease)   . Neuropathy     legs stay numb   . Depression     many years ago  . Hypertension     followed by intern med Dr. Marisue Brooklyn [phone]  . Tubulovillous adenoma of rectum   . Balance disorder 2008.      Falls a lot.  She can be standing and then leans too far over to one side   . Wears glasses   . Hearing loss     mild  . Iatrogenic adrenal insufficiency   . Osteoarthritis     hands,   . COPD (chronic obstructive pulmonary disease)       Past Surgical History  Procedure Laterality Date  . Incontinence surgery      multiple procedures, not cured  . Rectal surgery      by dr. Marlou Starks, removal of polyp  . Appendectomy    . Abdominal hysterectomy    . Tonsillectomy    . Eye surgery      bilateral cataract surgery and lens implant  . Dilation and curettage of uterus    . Rectal biopsy  09/21/2011    Procedure: BIOPSY RECTAL;  Surgeon: Merrie Roof, MD;  Location: [place];  Service: General;  Laterality: N/A;  3-4 cm  . Finger surgery      fusions and debridements for OA  . External fixation leg  10/25/2012    Procedure: EXTERNAL FIXATION LEG;  Surgeon: Rozanna Box, MD;  Location: [place];  Service: Orthopedics;  Laterality: Right;  . Knee arthroscopy with medial menisectomy Right 09/19/2014    Procedure: RIGHT KNEE ARTHROSCOPY WITH MEDIAL AND LATERAL MENISECTOMIES.  CHONDROPLASTY OF PATELLA-FEMORAL JOINT;  Surgeon: Alta Corning, MD;  Location: [place];  Service: Orthopedics;  Laterality: Right;  . Knee arthroscopy with lateral menisectomy Right 09/19/2014    Procedure: KNEE ARTHROSCOPY WITH LATERAL MENISECTOMY;  Surgeon: Alta Corning, MD;  Location: [place];  Service: Orthopedics;  Laterality: Right;  . Chondroplasty  09/19/2014    Procedure: CHONDROPLASTY;  Surgeon: Alta Corning, MD;  Location: [place];  Service: Orthopedics;;    in for   Chief Complaint  Patient presents with  . Weakness     HPI  Mulki Roesler  is a 79 y.o. female, with a hx of chronic bronchitis and diastolic heart failure who came in  today complaining of  generalized weakness, SOB, and a cough over the past week or 2 which is getting progressively worse.  She denies fever, N/V, PND, chest pain, heart palpitations, sick contacts or recent colds.  She has been coughing up yellow sputum but denies hemoptysis.  She says exertion makes the SOB worse but resting makes it better after a few minutes.  She is not a smoker and she has never been around anyone who smokes.  According to her, she has never been diagnosed with COPD or asthma.  She was hospitalized 2 months ago for these same symptoms.  She has had her flu shot this year.   Review of Systems    In addition to the HPI above,  No Fever-chills, No Headache, No changes with Vision or hearing, No problems swallowing food or Liquids, No Chest pain; Cough and Shortness of Breath that is an ongoing problem but has gotten worse over the last 2 weeks No Abdominal pain, No Nausea or Vomiting, Bowel movements are regular, No Blood in stool or Urine, No dysuria, No new skin rashes; bruise on ankle and thigh from fall 2 weeks ago at doctor's office (was reaching for walker and lost her balance) No new joints pains-aches,  No new weakness, tingling, numbness in any extremity, No  recent weight gain or loss, No polyuria, polydypsia or polyphagia; hx of bladder incontinence No significant Mental Stressors.  A full 10 point Review of Systems was done, except as stated above, all other Review of Systems were negative.   Social History History  Substance Use Topics  . Smoking status: Never Smoker   . Smokeless tobacco: Never Used  . Alcohol Use: No     Family History Family History  Problem Relation Age of Onset  . Cancer Sister     pt unaware of what kind  . High blood pressure Mother   . COPD Mother   . Heart attack Father      Prior to Admission medications   Medication Sig Start Date End Date Taking? Authorizing Provider  acetaminophen (TYLENOL) 325 MG tablet Take 1-2 tablets (325-650 mg total) by mouth every 6 (six) hours as needed. 10/27/12  Yes Jari Pigg, PA-C  ADVAIR DISKUS 250-50 MCG/DOSE AEPB INHALE ONE PUFF BY MOUTH TWICE DAILY 11/07/13  Yes Melissa R Smith, PA-C  albuterol (PROVENTIL) (2.5 MG/3ML) 0.083% nebulizer solution Take 3 mLs (2.5 mg total) by nebulization every 6 (six) hours as needed for wheezing or shortness of breath. 01/15/14  Yes Vicie Mutters, PA-C  amitriptyline (ELAVIL) 10 MG tablet Take 1 tablet (10 mg total) by mouth 3 (three) times daily. Patient taking differently: Take 10 mg by mouth 2 (two) times daily.  05/31/14  Yes [name], MD  aspirin 81 MG tablet Take 81 mg by mouth daily.    Yes Historical Provider, MD  cholecalciferol (VITAMIN D) 1000 UNITS tablet Take 5 tablets (5,000 Units total) by mouth daily. 10/28/12  Yes Lezlie Octave Black, NP  FLUoxetine (PROZAC) 40 MG capsule Take 1 capsule (40 mg total) by mouth daily. 06/01/14 06/01/15 Yes [name], MD  losartan-hydrochlorothiazide (HYZAAR) 100-25 MG per tablet Take 1 tablet by mouth daily. Patient taking differently: Take 0.5 tablets by mouth daily.  06/01/14  Yes [name], MD  metoprolol succinate (TOPROL-XL) 25 MG 24 hr tablet Take 12.5 mg by mouth 2 (two)  times daily.   Yes Historical Provider, MD  Multiple Vitamin (MULTIVITAMIN) capsule Take 1 capsule by mouth daily.  Yes Historical Provider, MD  omeprazole (PRILOSEC) 40 MG capsule TAKE ONE CAPSULE BY MOUTH TWICE DAILY FOR ACID REFLUX 06/01/14  Yes [name], MD  oxybutynin (DITROPAN) 5 MG tablet Take 1 tablet (5 mg total) by mouth daily. 09/24/14  Yes [name], MD  potassium chloride SA (K-DUR,KLOR-CON) 20 MEQ tablet Take 20 mEq by mouth 2 (two) times daily.   Yes Historical Provider, MD  pravastatin (PRAVACHOL) 40 MG tablet TAKE ONE TABLET BY MOUTH ONCE DAILY FOR CHOLESTEROL 06/01/14  Yes [name], MD  predniSONE (DELTASONE) 10 MG tablet Take 1 tablet (10 mg total) by mouth daily with breakfast. 06/01/14  Yes [name], MD  traMADol (ULTRAM) 50 MG tablet Take 1-2 tablets (50-100 mg total) by mouth every 6 (six) hours as needed for moderate pain. 09/19/14  Yes Erlene Senters, PA-C  amoxicillin (AMOXIL) 250 MG capsule Take 1 capsule 3 x day after meal for 10 days Patient not taking: Reported on 11/12/2014 09/01/14   [name], MD  HYDROcodone-acetaminophen [facility]) 5-325 MG per tablet Take 1-2 tablets by mouth every 6 (six) hours as needed for moderate pain. Patient not taking: Reported on 11/12/2014 09/19/14   Erlene Senters, PA-C  nystatin (MYCOSTATIN) 100000 UNIT/ML suspension Swish and spit 5 ml QID for two days after symptoms resolve. Patient not taking: Reported on 11/12/2014 09/10/14   [name], MD    Allergies  Allergen Reactions  . Codeine Other (See Comments)    hallucinations  . Demerol Other (See Comments)    hallucinations  . Morphine And Related Other (See Comments)    unknown  . Other Other (See Comments)    Invan 7- pt became red all over  . Sulfate Other (See Comments)    unknown    Physical Exam  Vitals Filed Vitals:   11/12/14 1015 11/12/14 1045 11/12/14 1118 11/12/14 1140  BP: 119/65 127/46 116/92   Pulse: 82 91 93   Temp:       TempSrc:      Resp: 21 19 25    Height:      Weight:      SpO2: 95% 90% 91% 92%      General:  lying in bed in NAD, talking with husband  Psych:  Normal affect and insight, Not Suicidal or Homicidal, Awake Alert, Oriented X 3.  Neuro:   No F.N deficits  ENT:  Ears and Eyes appear Normal, Conjunctivae clear. Moist Oral Mucosa.  Neck:  Supple Neck, No JVD, No cervical lymphadenopathy appreciated  Respiratory:  Symmetrical Chest wall movement, audible wheezes, coarse respiratory sounds, bilateral rhonchi  Cardiac:  RRR, No Gallops, Rubs or Murmurs, No Parasternal Heave.  Abdomen:  Positive Bowel Sounds, Abdomen Soft, Non tender, No organomegaly appriciated  Skin:  No Cyanosis, Normal Skin Turgor, No Skin Rash, bruise on ankle from fall 2 weeks ago at doctor's office    Data Review  CBC  Recent Labs Lab 11/12/14 0734  WBC 9.3  HGB 15.9*  HCT 47.9*  PLT 213  MCV 95.0  MCH 31.5  MCHC 33.2  RDW 13.8  LYMPHSABS 2.7  MONOABS 0.9  EOSABS 0.7  BASOSABS 0.1   ------------------------------------------------------------------------------------------------------------------  Chemistries   Recent Labs Lab 11/12/14 0734  NA 141  K 3.3*  CL 99  CO2 32  GLUCOSE 132*  BUN 21  CREATININE 1.31*  CALCIUM 9.0   ------------------------------------------------------------------------------------------------------------------ estimated creatinine clearance is 36.7 mL/min (by C-G formula based on Cr of 1.31). ------------------------------------------------------------------------------------------------------------------ No results for input(s):  TSH, T4TOTAL, T3FREE, THYROIDAB in the last 72 hours.  Invalid input(s): FREET3   Coagulation profile No results for input(s): INR, PROTIME in the last 168 hours. ------------------------------------------------------------------------------------------------------------------- No results for input(s): DDIMER in the last 72  hours. -------------------------------------------------------------------------------------------------------------------  Cardiac Enzymes No results for input(s): CKMB, TROPONINI, MYOGLOBIN in the last 168 hours.  Invalid input(s): CK ------------------------------------------------------------------------------------------------------------------ Invalid input(s): POCBNP   ---------------------------------------------------------------------------------------------------------------  Urinalysis    Component Value Date/Time   COLORURINE YELLOW 09/03/2014 [place] 09/03/2014 1124   LABSPEC 1.010 09/03/2014 1124   PHURINE 7.0 09/03/2014 1124   GLUCOSEU NEGATIVE 09/03/2014 1124   HGBUR NEGATIVE 09/03/2014 1124   BILIRUBINUR NEGATIVE 09/03/2014 1124   KETONESUR NEGATIVE 09/03/2014 1124   PROTEINUR NEGATIVE 09/03/2014 1124   UROBILINOGEN 0.2 09/03/2014 1124   NITRITE NEGATIVE 09/03/2014 1124   LEUKOCYTESUR TRACE* 09/03/2014 1124    ----------------------------------------------------------------------------------------------------------------  Imaging results:   Dg Chest Portable 1 View  11/12/2014   CLINICAL DATA:  Dyspnea with cough and congestion. Initial encounter.  EXAM: PORTABLE CHEST - 1 VIEW  COMPARISON:  09/03/2014 and 01/15/2014 radiographs.  FINDINGS: 0736 hr. There are lower lung volumes with mild resulting bibasilar atelectasis. The lungs are otherwise clear. The heart size and mediastinal contours are stable. There is no pleural effusion or pneumothorax. The bones appear unremarkable. Telemetry leads overlie the chest.  IMPRESSION: Mild bibasilar atelectasis.  Otherwise stable examination.   Electronically Signed   By: Camie Patience M.D.   On: 11/12/2014 07:51    My personal review of EKG: Rhythm NSR, Rate  95 /min, QTc 500 , no Acute ST changes    Assessment & Plan  Active Problems:   GERD (gastroesophageal reflux disease)   Hypertension    Unstable Gait   Asthma exacerbation   CAP (community acquired pneumonia)   Dyspnea   Asthma  Asthma exacerbation Possibly secondary to CAP or acute bacterial bronchitis,  Prescribed Albuterol nebulizer, duoneb, dulera, and deltasone  Repeat CXR ordered for 1/26, started on Rocephin and Azithromycin H. Flu PCR also ordered  Community acquired pneumonia Same treatment plan as above  Acute kidney injury Likely due to home meds: losartan-hydrochlorothiazide BP soft today, med held Normal saline started Monitor BMP tomorrow  Hypokalemia Likely due to home meds: losartan-hydrochlorothiazide BP soft today, med held Prescribed potassium chloride Monitor BMP tomorrow  Unstable gait Possibly secondary to oxybutynin Dose reduced to 2.5 mg PT consult requested  Hypertension BP soft today (101/51 this morning) Hyzaar held Metoprolol continued Continue monitoring BP and consider restarting if BP increases and kidney function improves  GERD Maalox/Mylanta ordered  Prolonged QTc EKG showing QTc of 500 Will decrease her SSRI dose Repeat EKG in am, keep her on tele  DVT Prophylaxis Heparin   AM Labs Ordered, also please review Full Orders  Family Communication:      Code Status:  Full  Likely DC to  Home with husband when medically appropriate  Condition:  Guarded  Time spent in minutes : 60    Howell-Methvin, Caroline PA-C on 11/12/2014 at 12:11 PM  Between 7am to 7pm - Pager -   After 7pm go to [email or web address] - password [email or web address]  And look for the night coverage person covering me after hours  [facility] Office  [phone]    Addendum   I personally evaluated patient in the emergency room and agree with the above findings.  Patient is a pleasant 79 year old with a history of asthma, chronic bronchitis, who presents to  the emergency department with complaints of discharge breath, cough, wheezing, cough that is associated with yellow sputum  production. Symptoms started approximately 2 weeks ago. On presentation she had an extensive bilateral rhonchi, expiratory wheezes although did not require supplemental oxygen. In the emergency room she was administered albuterol and prednisone, with x-ray showing bibasilar atelectasis, low lung volumes, no evidence of infiltrate or CHF. Blood work showed white count of 9,300 and had a creatinine of 1.31 as well as potassium of 3.3. Patient on hydrochlorothiazide at home which I believe may have contributed to hypokalemia and dehydration. Patient will be admitted to telemetry, start her on oral steroids, empiric antibiotic therapy with azithromycin and ceftriaxone as well as provide nebulizer treatments.. EKG did show prolonged QTc of 500. Patient will need telemetry and repeat EKG in a.m Her SSRI dose was decreased by half. Will repeat a chest x-ray in a.m.

## 2014-11-12 NOTE — ED Notes (Signed)
MD at bedside. 

## 2014-11-12 NOTE — ED Notes (Signed)
Pt brought in via EMS. Pt complaining of generalized weakness, SOB, and a cough over the past week which is getting progressively worse. Pt denies fever, N/V. Pt lung sounds rhonchus bilaterally. Pt coughing up yellowish sputum- per patient. Pt alert and oriented. V/S 92% on R/A, 117/80-B/P, 72-HR.

## 2014-11-13 ENCOUNTER — Observation Stay (HOSPITAL_COMMUNITY): Payer: Medicare Other

## 2014-11-13 DIAGNOSIS — R0609 Other forms of dyspnea: Secondary | ICD-10-CM | POA: Diagnosis not present

## 2014-11-13 DIAGNOSIS — R0600 Dyspnea, unspecified: Secondary | ICD-10-CM | POA: Diagnosis not present

## 2014-11-13 DIAGNOSIS — H9190 Unspecified hearing loss, unspecified ear: Secondary | ICD-10-CM | POA: Diagnosis present

## 2014-11-13 DIAGNOSIS — J9601 Acute respiratory failure with hypoxia: Secondary | ICD-10-CM | POA: Diagnosis present

## 2014-11-13 DIAGNOSIS — J42 Unspecified chronic bronchitis: Secondary | ICD-10-CM | POA: Diagnosis present

## 2014-11-13 DIAGNOSIS — J45901 Unspecified asthma with (acute) exacerbation: Secondary | ICD-10-CM | POA: Diagnosis present

## 2014-11-13 DIAGNOSIS — I5033 Acute on chronic diastolic (congestive) heart failure: Secondary | ICD-10-CM | POA: Diagnosis not present

## 2014-11-13 DIAGNOSIS — M810 Age-related osteoporosis without current pathological fracture: Secondary | ICD-10-CM | POA: Diagnosis present

## 2014-11-13 DIAGNOSIS — J189 Pneumonia, unspecified organism: Secondary | ICD-10-CM | POA: Diagnosis present

## 2014-11-13 DIAGNOSIS — K219 Gastro-esophageal reflux disease without esophagitis: Secondary | ICD-10-CM | POA: Diagnosis present

## 2014-11-13 DIAGNOSIS — I10 Essential (primary) hypertension: Secondary | ICD-10-CM | POA: Diagnosis not present

## 2014-11-13 DIAGNOSIS — J90 Pleural effusion, not elsewhere classified: Secondary | ICD-10-CM | POA: Diagnosis not present

## 2014-11-13 DIAGNOSIS — J440 Chronic obstructive pulmonary disease with acute lower respiratory infection: Secondary | ICD-10-CM | POA: Diagnosis present

## 2014-11-13 DIAGNOSIS — E876 Hypokalemia: Secondary | ICD-10-CM | POA: Diagnosis present

## 2014-11-13 DIAGNOSIS — J984 Other disorders of lung: Secondary | ICD-10-CM | POA: Diagnosis not present

## 2014-11-13 DIAGNOSIS — J00 Acute nasopharyngitis [common cold]: Secondary | ICD-10-CM

## 2014-11-13 DIAGNOSIS — E785 Hyperlipidemia, unspecified: Secondary | ICD-10-CM | POA: Diagnosis present

## 2014-11-13 DIAGNOSIS — J441 Chronic obstructive pulmonary disease with (acute) exacerbation: Secondary | ICD-10-CM | POA: Diagnosis present

## 2014-11-13 DIAGNOSIS — Z885 Allergy status to narcotic agent status: Secondary | ICD-10-CM | POA: Diagnosis not present

## 2014-11-13 DIAGNOSIS — R0602 Shortness of breath: Secondary | ICD-10-CM | POA: Diagnosis not present

## 2014-11-13 DIAGNOSIS — E860 Dehydration: Secondary | ICD-10-CM | POA: Diagnosis present

## 2014-11-13 DIAGNOSIS — Z7982 Long term (current) use of aspirin: Secondary | ICD-10-CM | POA: Diagnosis not present

## 2014-11-13 DIAGNOSIS — E273 Drug-induced adrenocortical insufficiency: Secondary | ICD-10-CM | POA: Diagnosis present

## 2014-11-13 DIAGNOSIS — I509 Heart failure, unspecified: Secondary | ICD-10-CM | POA: Diagnosis not present

## 2014-11-13 DIAGNOSIS — M1990 Unspecified osteoarthritis, unspecified site: Secondary | ICD-10-CM | POA: Diagnosis present

## 2014-11-13 DIAGNOSIS — N179 Acute kidney failure, unspecified: Secondary | ICD-10-CM | POA: Diagnosis present

## 2014-11-13 DIAGNOSIS — G629 Polyneuropathy, unspecified: Secondary | ICD-10-CM | POA: Diagnosis present

## 2014-11-13 DIAGNOSIS — Z882 Allergy status to sulfonamides status: Secondary | ICD-10-CM | POA: Diagnosis not present

## 2014-11-13 DIAGNOSIS — F329 Major depressive disorder, single episode, unspecified: Secondary | ICD-10-CM | POA: Diagnosis present

## 2014-11-13 DIAGNOSIS — Z7952 Long term (current) use of systemic steroids: Secondary | ICD-10-CM | POA: Diagnosis not present

## 2014-11-13 DIAGNOSIS — R531 Weakness: Secondary | ICD-10-CM | POA: Diagnosis not present

## 2014-11-13 DIAGNOSIS — R9431 Abnormal electrocardiogram [ECG] [EKG]: Secondary | ICD-10-CM | POA: Diagnosis present

## 2014-11-13 LAB — GLUCOSE, CAPILLARY: Glucose-Capillary: 140 mg/dL — ABNORMAL HIGH (ref 70–99)

## 2014-11-13 LAB — CBC
HEMATOCRIT: 40.1 % (ref 36.0–46.0)
Hemoglobin: 13.1 g/dL (ref 12.0–15.0)
MCH: 31.2 pg (ref 26.0–34.0)
MCHC: 32.7 g/dL (ref 30.0–36.0)
MCV: 95.5 fL (ref 78.0–100.0)
Platelets: 171 10*3/uL (ref 150–400)
RBC: 4.2 MIL/uL (ref 3.87–5.11)
RDW: 13.9 % (ref 11.5–15.5)
WBC: 12.2 10*3/uL — ABNORMAL HIGH (ref 4.0–10.5)

## 2014-11-13 LAB — BASIC METABOLIC PANEL
Anion gap: 10 (ref 5–15)
BUN: 25 mg/dL — ABNORMAL HIGH (ref 6–23)
CALCIUM: 8.7 mg/dL (ref 8.4–10.5)
CHLORIDE: 109 mmol/L (ref 96–112)
CO2: 21 mmol/L (ref 19–32)
Creatinine, Ser: 1.07 mg/dL (ref 0.50–1.10)
GFR calc non Af Amer: 49 mL/min — ABNORMAL LOW (ref >90)
GFR, EST AFRICAN AMERICAN: 57 mL/min — AB (ref >90)
GLUCOSE: 157 mg/dL — AB (ref 70–99)
POTASSIUM: 3.8 mmol/L (ref 3.5–5.1)
Sodium: 140 mmol/L (ref 135–145)

## 2014-11-13 MED ORDER — FLUOXETINE HCL 20 MG PO CAPS
20.0000 mg | ORAL_CAPSULE | Freq: Every day | ORAL | Status: DC
  Administered 2014-11-13 – 2014-11-16 (×4): 20 mg via ORAL
  Filled 2014-11-13 (×4): qty 1

## 2014-11-13 MED ORDER — FLUOXETINE HCL 20 MG PO CAPS: 20.0000 mg | ORAL_CAPSULE | Freq: Every day | ORAL | Status: DC

## 2014-11-13 MED ORDER — ALBUTEROL SULFATE (2.5 MG/3ML) 0.083% IN NEBU
2.5000 mg | INHALATION_SOLUTION | Freq: Four times a day (QID) | RESPIRATORY_TRACT | Status: DC
  Administered 2014-11-13 – 2014-11-14 (×2): 2.5 mg via RESPIRATORY_TRACT
  Filled 2014-11-13 (×4): qty 3

## 2014-11-13 MED ORDER — AZITHROMYCIN 500 MG PO TABS
500.0000 mg | ORAL_TABLET | ORAL | Status: DC
  Filled 2014-11-13 (×3): qty 1

## 2014-11-13 MED ORDER — ALBUTEROL SULFATE (2.5 MG/3ML) 0.083% IN NEBU
2.5000 mg | INHALATION_SOLUTION | RESPIRATORY_TRACT | Status: DC | PRN
  Administered 2014-11-14: 2.5 mg via RESPIRATORY_TRACT
  Filled 2014-11-13: qty 3

## 2014-11-13 NOTE — Progress Notes (Signed)
UR Completed.  [phone]  

## 2014-11-13 NOTE — Progress Notes (Signed)
PROGRESS NOTE  Marie [name] QBH:[id number] DOB: 1936/07/05 DOA: 11/12/2014 PCP: Alesia Richards, MD  HPI/Subjective: Marie [name] is a 79 y.o. Caucasian female, with a hx of chronic bronchitis and diastolic heart failure who was brought into the ED yesterday by her husband with complaints of generalized weakness, SOB, and a cough over the past week or 2 which is getting progressively worse. She denied fever, N/V, PND, chest pain, heart palpitations, sick contacts or recent colds. She has been coughing up yellow sputum but denies hemoptysis. She says exertion makes the SOB worse but resting makes it better after a few minutes. She is not a smoker and she has never been around anyone who smokes. According to her, she has never been diagnosed with COPD or asthma. She was hospitalized 2 months ago for these same symptoms. She has had her flu shot this year.  After spending the night here and receiving breathing treatments, she says she feels much better today.  She still has wheezing and some coughing spells, so she and her husband are agreeable to staying at least another day to make sure these issues are resolved.  Assessment/Plan:  Asthma exacerbation Possibly secondary to CAP vs acute bacterial bronchitis Continue Rocephin and Azithromycin  Continue Albuterol nebulizer, duoneb, dulera, and Prednsione H. Flu PCR negative Repeat CXR showed mild interstitial infiltrate or edema - stopped IV fluids O2 sats 96% on room air when ambulating with PT today Reassess O2 sats and pulm exam tomorrow Anticipate discharge in the next 24 hours  Community acquired pneumonia vs bacterial bronchitis Repeat CXR did not show infiltrate Continue 24 more hours of IV Rocephin, change Azithromycin to oral  Acute kidney injury Likely due to home meds: losartan-hydrochlorothiazide BP 133/75 today, normal saline stopped Continue to hold Hyzaar for another day Cr down-trending: 1.31 yesterday, 1.07  today (baseline 0.99 from November) Monitor BMP and BP tomorrow  Hypokalemia Likely due to home meds: losartan-hydrochlorothiazide Resolved after receiving potassium chloride on 1/25 Continue to hold Hyzaar for another day Monitor BMP tomorrow: she has a hx of hypokalemia, consider restarting Hyzaar  Unstable gait Possibly secondary to oxybutynin Dose reduced to 2.5 mg, pt states she feels more stable today PT consult - agrees she has some unsteadiness, they say she has all the support/equiptment she needs at home Pt agreeable to stopping oxybutynin all together after thorough discussion of the side effects  Hypertension Hyzaar held to resolve AKI and hypokalemia Metoprolol continued Continue monitoring BP and consider restarting Hyzaar tomorrow if BP increases and kidney function improves  Prolonged QTc EKG showing QTc of 500 Decreased her SSRI dose on 1/26 from 40 to 20 Pt agreeable to tapering off of med outpatient after discussing side effects Repeat EKG pending   GERD Continue Maalox/Mylanta   DVT Prophylaxis:  Heparin SQ  Code Status: Full Family Communication: Husband present at bedside Disposition Plan: Remain inpatient for another day and reassess breathing/coughing tomorrow   Consultants:  None  Procedures:  EKG 11/12/14: QTc prolonged (500 ms)  Repeat EKG: pending  Antibiotics: Anti-infectives    Start     Dose/Rate Route Frequency Ordered Stop   11/12/14 1300  cefTRIAXone (ROCEPHIN) 1 g in dextrose 5 % 50 mL IVPB - Premix     1 g100 mL/hr over 30 Minutes Intravenous Every 24 hours 11/12/14 1213     11/12/14 1300  azithromycin (ZITHROMAX) 500 mg in dextrose 5 % 250 mL IVPB     500 mg250 mL/hr over 60 Minutes Intravenous  Every 24 hours 11/12/14 1213         Objective: Filed Vitals:   11/12/14 2144 11/13/14 0536 11/13/14 0859 11/13/14 1002  BP:  107/51  133/75  Pulse:  73  87  Temp:  98 F (36.7 C)  97.7 F (36.5 C)  TempSrc:  Oral  Oral    Resp:  16  16  Height:      Weight:      SpO2: 92% 96% 93% 97%    Intake/Output Summary (Last 24 hours) at 11/13/14 1143 Last data filed at 11/13/14 1003  Gross per 24 hour  Intake   1005 ml  Output    450 ml  Net    555 ml   Filed Weights   11/12/14 0726 11/12/14 1224  Weight: 77.111 kg (170 lb) 76.5 kg (168 lb 10.4 oz)    Exam: General: Well developed, well nourished, NAD, appears stated age, sitting up in chair talking to husband  HEENT:  Anicteic Sclera, MMM. Neck: Supple, no JVD, no masses  Cardiovascular: RRR, S1 S2 auscultated, no rubs, murmurs or gallops.   Respiratory: Audible wheezes, bilateral rhonchi, deep breaths during exam initiated coughing Abdomen: Soft, nontender, nondistended, + bowel sounds  Neuro: AAOx3 Skin: Without rashes, exudates, or nodules.  Bruises on ankle, leg, and thigh from fall at doctor's office 2 weeks ago look the same as yesterday.   Psych: Normal affect and demeanor with intact judgement and insight   Data Reviewed: Basic Metabolic Panel:  Recent Labs Lab 11/12/14 0734 11/12/14 1315 11/13/14 0620  NA 141  --  140  K 3.3*  --  3.8  CL 99  --  109  CO2 32  --  21  GLUCOSE 132*  --  157*  BUN 21  --  25*  CREATININE 1.31* 1.14* 1.07  CALCIUM 9.0  --  8.7   Liver Function Tests: No results for input(s): AST, ALT, ALKPHOS, BILITOT, PROT, ALBUMIN in the last 168 hours. No results for input(s): LIPASE, AMYLASE in the last 168 hours. No results for input(s): AMMONIA in the last 168 hours. CBC:  Recent Labs Lab 11/12/14 0734 11/12/14 1315 11/13/14 0620  WBC 9.3 7.4 12.2*  NEUTROABS 4.9  --   --   HGB 15.9* 14.6 13.1  HCT 47.9* 44.9 40.1  MCV 95.0 94.9 95.5  PLT 213 187 171   Cardiac Enzymes: No results for input(s): CKTOTAL, CKMB, CKMBINDEX, TROPONINI in the last 168 hours. BNP (last 3 results)  Recent Labs  12/05/13 1154  PROBNP 101.9   CBG:  Recent Labs Lab 11/13/14 0749  GLUCAP 140*    Studies: X-ray  Chest Pa And Lateral  11/13/2014   CLINICAL DATA:  Pneumonia, shortness of Breath  EXAM: CHEST - 2 VIEW  COMPARISON:  11/12/2014  FINDINGS: Mild diffuse interstitial prominence, increased since prior study. No confluent airspace disease. Heart size remains normal. Atheromatous aorta. No effusion. Minimal spurring in the lower thoracic spine.  IMPRESSION: 1. Mild interstitial infiltrate or edema   Electronically Signed   By: Arne Cleveland M.D.   On: 11/13/2014 08:15   Dg Chest Portable 1 View  11/12/2014   CLINICAL DATA:  Dyspnea with cough and congestion. Initial encounter.  EXAM: PORTABLE CHEST - 1 VIEW  COMPARISON:  09/03/2014 and 01/15/2014 radiographs.  FINDINGS: 0736 hr. There are lower lung volumes with mild resulting bibasilar atelectasis. The lungs are otherwise clear. The heart size and mediastinal contours are stable. There is no pleural effusion  or pneumothorax. The bones appear unremarkable. Telemetry leads overlie the chest.  IMPRESSION: Mild bibasilar atelectasis.  Otherwise stable examination.   Electronically Signed   By: Camie Patience M.D.   On: 11/12/2014 07:51    Scheduled Meds: . albuterol  2.5 mg Nebulization QID  . amitriptyline  10 mg Oral BID  . azithromycin  500 mg Intravenous Q24H  . cefTRIAXone (ROCEPHIN)  IV  1 g Intravenous Q24H  . FLUoxetine  20 mg Oral Daily  . heparin  5,000 Units Subcutaneous 3 times per day  . metoprolol succinate  12.5 mg Oral BID  . mometasone-formoterol  2 puff Inhalation BID  . potassium chloride SA  20 mEq Oral BID  . predniSONE  60 mg Oral Q breakfast  . sodium chloride  3 mL Intravenous Q12H   Continuous Infusions:   Active Problems:   GERD (gastroesophageal reflux disease)   Hypertension   Unstable Gait   Asthma exacerbation   CAP (community acquired pneumonia)   Dyspnea   Asthma   Hypokalemia  Marie Racer E. Howell-Methvin, PA-S     [facility] Pager [phone]. If 7PM-7AM, please contact night-coverage at  [email or web address], password [email or web address] 11/13/2014, 11:43 AM  LOS: 1 day     Addendum  I personally saw and evaluated patient on 11/13/2014 and agree with the above findings. Patient was admitted on 11/12/2014 presenting with complaints of cough, shortness of breath, symptoms felt to be secondary to commit acquire money versus acute bacterial bronchitis. She had extensive wheezes on exam which I suspect is related to reactive airway disease. She was started on oral prednisone at 60 mg by mouth daily and empiric antimicrobial therapy with ceftriaxone and azithromycin. Patient showing clinical improvement on today's exam however continues to have expiratory wheezes. She reports not quite be at her baseline. Repeat chest x-ray was stable, did not show new infiltrate. This could be findings consistent with mild CHF. IV fluids were discontinued. Anticipate discharge in the next 24 hours she continues to improve.

## 2014-11-13 NOTE — Evaluation (Signed)
Physical Therapy Evaluation Patient Details Name: Marie [name] MRN: [id number] DOB: 01-25-1936 Today's Date: 11/13/2014   History of Present Illness  Pt is a 79 y.o. female, with a hx of chronic bronchitis and diastolic heart failure who came in today complaining of generalized weakness, SOB, and a cough over the past week or 2 which is getting progressively worse. She has been coughing up yellow sputum but denies hemoptysis.She says exertion makes the SOB worse but resting makes it better after a few minutes.  Clinical Impression  Pt admitted with above diagnosis. Pt currently with functional limitations due to the deficits listed below (see PT Problem List). At the time of PT eval pt was able to perform transfers and ambulation with close guard or occasional min assist. Coughing fits appear to be effecting balance. Pt appears unsteady during gait training, however pt states she is at her baseline of function. Pt will benefit from skilled PT to increase their independence and safety with mobility to allow discharge to the venue listed below.  Will keep on PT caseload to continue gait and balance training until d/c.      Follow Up Recommendations Outpatient PT;Other (comment) (Higher level balance training)    Equipment Recommendations  None recommended by PT    Recommendations for Other Services       Precautions / Restrictions Precautions Precautions: Fall Precaution Comments: Cough which is very strong at times and causes pt to be thrown off balance during these coughing fits.  Restrictions Weight Bearing Restrictions: No      Mobility  Bed Mobility Overal bed mobility: Needs Assistance Bed Mobility: Supine to Sit     Supine to sit: Supervision     General bed mobility comments: Increased time and heavy use of bed rails for assist. Supervision for safety as pt was coughing and appeared to have difficulty maintaining her trunk control at times.   Transfers Overall  transfer level: Needs assistance Equipment used: Rolling walker (2 wheeled) Transfers: Sit to/from Stand Sit to Stand: Min guard         General transfer comment: Pt was able to power-up to full standing position without assistance. With UE support on walker, no unsteadiness noted.   Ambulation/Gait Ambulation/Gait assistance: Min assist Ambulation Distance (Feet): 125 Feet Assistive device: Rolling walker (2 wheeled) Gait Pattern/deviations: Step-through pattern;Decreased stride length;Trunk flexed Gait velocity: Decreased Gait velocity interpretation: Below normal speed for age/gender General Gait Details: Pt was able to ambulate with RW and frequent VC's for walker direction to avoid obstacles in the hallway. Noted some wheezing during walk back to the room, and pt slightly SOB. O2 sats during this time at 96% on RA.   Stairs            Wheelchair Mobility    Modified Rankin (Stroke Patients Only)       Balance Overall balance assessment: Needs assistance Sitting-balance support: Feet supported Sitting balance-Leahy Scale: Fair     Standing balance support: Bilateral upper extremity supported;During functional activity Standing balance-Leahy Scale: Poor Standing balance comment: Pt requires UE support to maintain balance                             Pertinent Vitals/Pain Pain Assessment: No/denies pain    Home Living Family/patient expects to be discharged to:: Private residence Living Arrangements: Spouse/significant other Available Help at Discharge: Family;Available 24 hours/day Type of Home: House Home Access: Ramped entrance  Home Layout: One level Home Equipment: Walker - 2 wheels;Shower seat      Prior Function Level of Independence: Independent with assistive device(s)         Comments: Uses RW all the time. Has one for inside the home and one she keeps in her car for the community.      Hand Dominance   Dominant Hand:  Right    Extremity/Trunk Assessment   Upper Extremity Assessment: Defer to OT evaluation           Lower Extremity Assessment: Generalized weakness      Cervical / Trunk Assessment: Kyphotic  Communication   Communication: No difficulties  Cognition Arousal/Alertness: Awake/alert Behavior During Therapy: WFL for tasks assessed/performed Overall Cognitive Status: Within Functional Limits for tasks assessed                      General Comments      Exercises        Assessment/Plan    PT Assessment Patient needs continued PT services  PT Diagnosis Difficulty walking;Generalized weakness   PT Problem List Decreased strength;Decreased range of motion;Decreased activity tolerance;Decreased balance;Decreased mobility;Decreased knowledge of use of DME;Decreased safety awareness;Decreased knowledge of precautions;Cardiopulmonary status limiting activity  PT Treatment Interventions DME instruction;Gait training;Stair training;Functional mobility training;Therapeutic activities;Therapeutic exercise;Neuromuscular re-education;Patient/family education   PT Goals (Current goals can be found in the Care Plan section) Acute Rehab PT Goals Patient Stated Goal: Return home as soon as possible PT Goal Formulation: With patient Time For Goal Achievement: 11/20/14 Potential to Achieve Goals: Good    Frequency Min 3X/week   Barriers to discharge        Co-evaluation               End of Session Equipment Utilized During Treatment: Gait belt Activity Tolerance: Patient limited by fatigue Patient left: in chair;with call bell/phone within reach Nurse Communication: Mobility status         Time: 0801-0820 PT Time Calculation (min) (ACUTE ONLY): 19 min   Charges:   PT Evaluation $Initial PT Evaluation Tier I: 1 Procedure     PT G CodesRolinda Roan 11-28-2014, 8:35 AM   Rolinda Roan, PT, DPT Acute Rehabilitation Services Pager:  [phone]

## 2014-11-14 ENCOUNTER — Inpatient Hospital Stay (HOSPITAL_COMMUNITY): Payer: Medicare Other

## 2014-11-14 DIAGNOSIS — R0600 Dyspnea, unspecified: Secondary | ICD-10-CM

## 2014-11-14 LAB — BASIC METABOLIC PANEL
ANION GAP: 7 (ref 5–15)
BUN: 29 mg/dL — AB (ref 6–23)
CO2: 24 mmol/L (ref 19–32)
CREATININE: 1.03 mg/dL (ref 0.50–1.10)
Calcium: 8.8 mg/dL (ref 8.4–10.5)
Chloride: 109 mmol/L (ref 96–112)
GFR calc non Af Amer: 51 mL/min — ABNORMAL LOW (ref >90)
GFR, EST AFRICAN AMERICAN: 60 mL/min — AB (ref >90)
Glucose, Bld: 120 mg/dL — ABNORMAL HIGH (ref 70–99)
Potassium: 3.9 mmol/L (ref 3.5–5.1)
Sodium: 140 mmol/L (ref 135–145)

## 2014-11-14 LAB — CBC
HCT: 39.7 % (ref 36.0–46.0)
HEMOGLOBIN: 12.7 g/dL (ref 12.0–15.0)
MCH: 30.5 pg (ref 26.0–34.0)
MCHC: 32 g/dL (ref 30.0–36.0)
MCV: 95.4 fL (ref 78.0–100.0)
Platelets: 184 10*3/uL (ref 150–400)
RBC: 4.16 MIL/uL (ref 3.87–5.11)
RDW: 14 % (ref 11.5–15.5)
WBC: 12.6 10*3/uL — ABNORMAL HIGH (ref 4.0–10.5)

## 2014-11-14 LAB — C-REACTIVE PROTEIN: CRP: 0.6 mg/dL — AB (ref <0.60)

## 2014-11-14 LAB — EXPECTORATED SPUTUM ASSESSMENT W GRAM STAIN, RFLX TO RESP C

## 2014-11-14 LAB — EXPECTORATED SPUTUM ASSESSMENT W REFEX TO RESP CULTURE

## 2014-11-14 LAB — SEDIMENTATION RATE: SED RATE: 4 mm/h (ref 0–22)

## 2014-11-14 LAB — BRAIN NATRIURETIC PEPTIDE: B Natriuretic Peptide: 712 pg/mL — ABNORMAL HIGH (ref 0.0–100.0)

## 2014-11-14 MED ORDER — IPRATROPIUM-ALBUTEROL 0.5-2.5 (3) MG/3ML IN SOLN
3.0000 mL | Freq: Three times a day (TID) | RESPIRATORY_TRACT | Status: DC
  Administered 2014-11-14 – 2014-11-16 (×6): 3 mL via RESPIRATORY_TRACT
  Filled 2014-11-14 (×7): qty 3

## 2014-11-14 MED ORDER — FUROSEMIDE 10 MG/ML IJ SOLN
40.0000 mg | Freq: Once | INTRAMUSCULAR | Status: AC
  Administered 2014-11-14: 40 mg via INTRAVENOUS
  Filled 2014-11-14: qty 4

## 2014-11-14 MED ORDER — METHYLPREDNISOLONE SODIUM SUCC 125 MG IJ SOLR
80.0000 mg | Freq: Four times a day (QID) | INTRAMUSCULAR | Status: DC
  Administered 2014-11-14 – 2014-11-15 (×4): 80 mg via INTRAVENOUS
  Filled 2014-11-14: qty 1.28
  Filled 2014-11-14 (×2): qty 2
  Filled 2014-11-14: qty 1.28
  Filled 2014-11-14: qty 2
  Filled 2014-11-14 (×2): qty 1.28
  Filled 2014-11-14: qty 2

## 2014-11-14 MED ORDER — IPRATROPIUM-ALBUTEROL 0.5-2.5 (3) MG/3ML IN SOLN: 3.0000 mL | RESPIRATORY_TRACT | Status: DC | PRN

## 2014-11-14 MED ORDER — FUROSEMIDE 10 MG/ML IJ SOLN
40.0000 mg | Freq: Two times a day (BID) | INTRAMUSCULAR | Status: DC
  Administered 2014-11-15 (×2): 40 mg via INTRAVENOUS
  Filled 2014-11-14 (×5): qty 4

## 2014-11-14 MED ORDER — AMOXICILLIN-POT CLAVULANATE 500-125 MG PO TABS
1.0000 | ORAL_TABLET | Freq: Three times a day (TID) | ORAL | Status: DC
Start: 2014-11-14 — End: 2014-11-15
  Administered 2014-11-14 – 2014-11-15 (×4): 500 mg via ORAL
  Filled 2014-11-14 (×6): qty 1

## 2014-11-14 MED ORDER — METHYLPREDNISOLONE SODIUM SUCC 125 MG IJ SOLR: 80.0000 mg | Freq: Three times a day (TID) | INTRAMUSCULAR | Status: DC

## 2014-11-14 NOTE — Progress Notes (Signed)
PROGRESS NOTE  Marie [name] BTD:[id number] DOB: Jan 25, 1936 DOA: 11/12/2014 PCP: Alesia Richards, MD  HPI/Subjective: 79 year old female patient with history of chronic bronchitis and diastolic heart failure who presented to the hospital with reports of generalized weakness, shortness of breath and cough for 2 weeks that had become progressively worse. No other constitutional symptoms. She reported productive yellow sputum. She also reports some exertional shortness of breath. She was hospitalized 2 months ago for similar symptoms. Chest x-ray in the ER revealed mild bibasilar atelectasis. At presentation she was afebrile but did have leukocytosis 12,200. Her blood pressure was somewhat solved at 101/51.  Assessment/Plan:  Acute respiratory failure with hypoxemia secondary to acute bacterial bronchitis  -Chest x-ray without focal infiltrate so suspect etiology is bronchitis -Increased bilateral wheezing today and now requiring oxygen so that change prednisone to IV Solu-Medrol every 6 hours -Discontinue azithromycin since can prolong QT and will begin Augmentin; will also dc Rocephin -Continue Albuterol nebulizer schedulled and when necessary, duoneb, dulera -Flu PCR negative -ESR 4 so does not appear to have inflammatory etiology to interstitial lung changes, CR-P pending -Repeat 2 view chest x-ray today with infiltrates concerning for edema  -Patient denies history of asthma and has never smoked  Known diastolic dysfunction -Increased wheezing concerning for possible cardiac etiology -Check 2-D echocardiogram-patient recently hospitalized for similar symptoms which may have been heart failure as opposed to infectious etiology -Check proBNP -give Lasix 40 mg IV x 1  Acute kidney injury -Baseline renal function 15/0.99 -Presented with renal function 21/1.31 -Offending medications held at time of admission (losartan-hctz)-continue to hold for now -Creatinine down to 1.3; BUN  increasing in the setting of steroids  Hypokalemia -Probably chronic and setting up on use of hctz -Resolved for now -May need low-dose potassium supplementation at discharge  Unstable gait -Possibly secondary to oxybutynin -Dose reduced to 2.5 mg and patient reported to M.D. on 1/26 improvement 8 instability -PT consult - agrees she has some unsteadiness, they say she has all the support/equiptment she needs at home -Pt agreeable to stopping oxybutynin all together after thorough discussion of the side effects  Hypertension -Blood pressure remains soft and antihypertensives remain on hold (see above)  Prolonged QTc -EKG showing QTc of >500 which is chronic based on previous EKG readings -Decreased her SSRI dose on 1/26 from 40 to 20 -Pt agreeable to tapering off of med outpatient after discussing side effects -We'll also adjust antibiotics therapy as well as  GERD -Continue Maalox/Mylanta   DVT Prophylaxis:  Heparin SQ  Code Status: Full Family Communication: Husband present at bedside Disposition Plan: Remain inpatient until respiratory failure symptoms resolved  Consultants:  None  Procedures:  EKG 11/12/14: QTc prolonged (500 ms)  Repeat EKG 11/13/14 with QTC 500 ms  Antibiotics: Anti-infectives    Start     Dose/Rate Route Frequency Ordered Stop   11/13/14 1300  azithromycin (ZITHROMAX) tablet 500 mg     500 mg Oral Every 24 hours 11/13/14 1253 11/16/14 1259   11/12/14 1300  cefTRIAXone (ROCEPHIN) 1 g in dextrose 5 % 50 mL IVPB - Premix     1 g100 mL/hr over 30 Minutes Intravenous Every 24 hours 11/12/14 1213     11/12/14 1300  azithromycin (ZITHROMAX) 500 mg in dextrose 5 % 250 mL IVPB  Status:  Discontinued     500 mg250 mL/hr over 60 Minutes Intravenous Every 24 hours 11/12/14 1213 11/13/14 1253       Objective: Filed Vitals:  11/14/14 0230 11/14/14 0500 11/14/14 0900 11/14/14 0930  BP:  130/57 123/51 123/51  Pulse:  66 68 68  Temp:  97.9 F (36.6 C)  97.7 F (36.5 C) 97.7 F (36.5 C)  TempSrc:  Oral Oral Oral  Resp:  _0 Height:      Weight:      SpO2: 95% 98% 98% 98%    Intake/Output Summary (Last 24 hours) at 11/14/14 1415 Last data filed at 11/14/14 0881  Gross per 24 hour  Intake    360 ml  Output    227 ml  Net    133 ml   Filed Weights   11/12/14 0726 11/12/14 1224 11/13/14 2100  Weight: 170 lb (77.111 kg) 168 lb 10.4 oz (76.5 kg) 176 lb 5.9 oz (80 kg)    Exam: General: Well developed, well nourished, NAD, appears stated age, sitting up in chair talking to husband  HEENT:  Anicteic Sclera, MMM. Neck: Supple, no JVD, no masses  Cardiovascular: RRR, S1 S2 auscultated, no rubs, murmurs or gallops.   Respiratory: Bilateral coarse wheezes, some crackles, now on nasal cannula oxygen Abdomen: Soft, nontender, nondistended, + bowel sounds  Neuro: AAOx3 Skin: Without rashes, exudates, or nodules. Has scattered using on lower extremities   Psych: Normal affect and demeanor with intact judgement and insight   Data Reviewed: Basic Metabolic Panel:  Recent Labs Lab 11/12/14 0734 11/12/14 1315 11/13/14 0620 11/14/14 0510  NA 141  --  140 140  K 3.3*  --  3.8 3.9  CL 99  --  109 109  CO2 32  --  21 24  GLUCOSE 132*  --  157* 120*  BUN 21  --  25* 29*  CREATININE 1.31* 1.14* 1.07 1.03  CALCIUM 9.0  --  8.7 8.8   Liver Function Tests: No results for input(s): AST, ALT, ALKPHOS, BILITOT, PROT, ALBUMIN in the last 168 hours. No results for input(s): LIPASE, AMYLASE in the last 168 hours. No results for input(s): AMMONIA in the last 168 hours. CBC:  Recent Labs Lab 11/12/14 0734 11/12/14 1315 11/13/14 0620 11/14/14 0510  WBC 9.3 7.4 12.2* 12.6*  NEUTROABS 4.9  --   --   --   HGB 15.9* 14.6 13.1 12.7  HCT 47.9* 44.9 40.1 39.7  MCV 95.0 94.9 95.5 95.4  PLT 213 187 171 184   Cardiac Enzymes: No results for input(s): CKTOTAL, CKMB, CKMBINDEX, TROPONINI in the last 168 hours. BNP (last 3  results)  Recent Labs  12/05/13 1154  PROBNP 101.9   CBG:  Recent Labs Lab 11/13/14 0749  GLUCAP 140*    Studies: Dg Chest 2 View  11/14/2014   CLINICAL DATA:  Short of breath for 1 week  EXAM: CHEST  2 VIEW  COMPARISON:  11/13/2014  FINDINGS: Central and basilar opacities and increased. Kerley B-lines have increased. Small pleural effusions have developed. No pneumothorax. Normal heart size.  IMPRESSION: Increased bibasilar airspace disease, small pleural effusions, and interstitial infiltrates worrisome for edema. The heart size is normal.   Electronically Signed   By: Maryclare Bean M.D.   On: 11/14/2014 11:32   X-ray Chest Pa And Lateral  11/13/2014   CLINICAL DATA:  Pneumonia, shortness of Breath  EXAM: CHEST - 2 VIEW  COMPARISON:  11/12/2014  FINDINGS: Mild diffuse interstitial prominence, increased since prior study. No confluent airspace disease. Heart size remains normal. Atheromatous aorta. No effusion. Minimal spurring in the lower thoracic spine.  IMPRESSION: 1. Mild interstitial  infiltrate or edema   Electronically Signed   By: Arne Cleveland M.D.   On: 11/13/2014 08:15    Scheduled Meds: . amitriptyline  10 mg Oral BID  . azithromycin  500 mg Oral Q24H  . cefTRIAXone (ROCEPHIN)  IV  1 g Intravenous Q24H  . FLUoxetine  20 mg Oral Daily  . heparin  5,000 Units Subcutaneous 3 times per day  . ipratropium-albuterol  3 mL Nebulization TID  . methylPREDNISolone (SOLU-MEDROL) injection  80 mg Intravenous Q6H  . metoprolol succinate  12.5 mg Oral BID  . mometasone-formoterol  2 puff Inhalation BID  . potassium chloride SA  20 mEq Oral BID  . sodium chloride  3 mL Intravenous Q12H     Erin Hearing, ANP  [facility] Pager [phone].   If 7PM-7AM, please contact night-coverage at [email or web address], password [facility] 11/14/2014, 2:15 PM  LOS: 2 days

## 2014-11-15 DIAGNOSIS — I509 Heart failure, unspecified: Secondary | ICD-10-CM

## 2014-11-15 DIAGNOSIS — J9601 Acute respiratory failure with hypoxia: Secondary | ICD-10-CM

## 2014-11-15 LAB — COMPREHENSIVE METABOLIC PANEL
ALBUMIN: 3.2 g/dL — AB (ref 3.5–5.2)
ALK PHOS: 42 U/L (ref 39–117)
ALT: 32 U/L (ref 0–35)
ANION GAP: 8 (ref 5–15)
AST: 28 U/L (ref 0–37)
BUN: 21 mg/dL (ref 6–23)
CHLORIDE: 107 mmol/L (ref 96–112)
CO2: 27 mmol/L (ref 19–32)
CREATININE: 1.07 mg/dL (ref 0.50–1.10)
Calcium: 8.6 mg/dL (ref 8.4–10.5)
GFR calc Af Amer: 57 mL/min — ABNORMAL LOW (ref >90)
GFR, EST NON AFRICAN AMERICAN: 49 mL/min — AB (ref >90)
Glucose, Bld: 160 mg/dL — ABNORMAL HIGH (ref 70–99)
POTASSIUM: 4.1 mmol/L (ref 3.5–5.1)
Sodium: 142 mmol/L (ref 135–145)
Total Bilirubin: 0.5 mg/dL (ref 0.3–1.2)
Total Protein: 5.8 g/dL — ABNORMAL LOW (ref 6.0–8.3)

## 2014-11-15 LAB — TROPONIN I
Troponin I: <0.03 ng/mL (ref <0.031)
Troponin I: <0.03 ng/mL (ref <0.031)

## 2014-11-15 LAB — CBC
HCT: 39.6 % (ref 36.0–46.0)
Hemoglobin: 12.8 g/dL (ref 12.0–15.0)
MCH: 30.5 pg (ref 26.0–34.0)
MCHC: 32.3 g/dL (ref 30.0–36.0)
MCV: 94.5 fL (ref 78.0–100.0)
Platelets: 200 10*3/uL (ref 150–400)
RBC: 4.19 MIL/uL (ref 3.87–5.11)
RDW: 13.7 % (ref 11.5–15.5)
WBC: 9 10*3/uL (ref 4.0–10.5)

## 2014-11-15 LAB — BRAIN NATRIURETIC PEPTIDE: B NATRIURETIC PEPTIDE 5: 635.6 pg/mL — AB (ref 0.0–100.0)

## 2014-11-15 MED ORDER — AMOXICILLIN-POT CLAVULANATE 875-125 MG PO TABS
1.0000 | ORAL_TABLET | Freq: Two times a day (BID) | ORAL | Status: DC
  Administered 2014-11-15 – 2014-11-16 (×2): 1 via ORAL
  Filled 2014-11-15 (×3): qty 1

## 2014-11-15 MED ORDER — METHYLPREDNISOLONE SODIUM SUCC 125 MG IJ SOLR
80.0000 mg | Freq: Three times a day (TID) | INTRAMUSCULAR | Status: DC
  Administered 2014-11-15: 80 mg via INTRAVENOUS
  Filled 2014-11-15: qty 2

## 2014-11-15 MED ORDER — PREDNISONE 50 MG PO TABS
60.0000 mg | ORAL_TABLET | Freq: Every day | ORAL | Status: DC
  Administered 2014-11-16: 60 mg via ORAL
  Filled 2014-11-15 (×2): qty 1

## 2014-11-15 MED ORDER — PANTOPRAZOLE SODIUM 40 MG PO TBEC
40.0000 mg | DELAYED_RELEASE_TABLET | Freq: Every day | ORAL | Status: DC
  Administered 2014-11-15: 40 mg via ORAL
  Filled 2014-11-15: qty 1

## 2014-11-15 NOTE — Progress Notes (Signed)
[facility] PROGRESS NOTE  Marie [name] QMG:[id number] DOB: 01/24/1936 DOA: 11/12/2014 PCP: Alesia Richards, MD  Assessment/Plan: #1 acute respiratory failure Likely secondary to acute CHF exacerbation plus or minus asthma exacerbation with bronchitis. Patient with significant clinical improvement after diuresis last night. Chest x-ray was consistent with volume overload. ProBNP was elevated and is trending back down. We'll cycle cardiac enzymes every 6 hours 3. Check a 2-D echo. Continue Lasix 40 mg IV every 12 hours. Strict I's and O's. Daily weights. Continue empiric antibiotics of Augmentin antibiotic day 3/5-7. Continue nebulizer treatments. Taper steroids. Follow.  #2 acute CHF exacerbation Questionable etiology. Patient denies any prior history of heart failure. However patient states was recently discontinued off her Lasix by her PCP approximately one to 2 months prior to admission. Cycle cardiac enzymes every 6 hours 3. Her BNP was elevated and is trending back down. Check a 2-D echo. Continue Lasix 40 mg IV every 12 hours. Follow.  #3 probable asthma exacerbation Clinically improving. Taper steroids. Continue empiric antibiotics and treat for total of 5-7 days. Nebulizer treatments. Follow.  #4 acute renal failure Likely secondary to prerenal azotemia secondary to problem #2. Improved with diuresis. Follow.  #5 hypokalemia Repleted.  #6 unstable gait Felt secondary to oxybutynin. Dose has been reduced with improvement in gait. PT/OT.  #7 hypertension Stable.  #8 prolonged QTC Repeat EKG showed improvement of prolonged QTC now at 483 from greater than 500. SSRI dose has been decreased. Taper off as outpatient.  #9 gastroesophageal reflux disease Maalox/Mylanta. PPI.  #10 prophylaxis PPI for GI prophylaxis. Heparin for DVT prophylaxis.   Code Status: Full Family Communication: Updated patient and husband at bedside. Disposition Plan: Home when medically  stable and 1-2 days.   Consultants:  None  Procedures:  Chest x-ray 11/14/2014, 11/13/2014  2-D echo 11/15/2014  Antibiotics:  Oral amoxicillin 11/14/14  IV Azithromycin 11/12/14  IV Rocephin 11/12/14  HPI/Subjective: Patient states SOB improved.  Objective: Filed Vitals:   11/15/14 1700  BP: 119/56  Pulse: 66  Temp: 98.2 F (36.8 C)  Resp: 18    Intake/Output Summary (Last 24 hours) at 11/15/14 1707 Last data filed at 11/15/14 1311  Gross per 24 hour  Intake    720 ml  Output    700 ml  Net     20 ml   Filed Weights   11/12/14 1224 11/13/14 2100 11/14/14 2147  Weight: 76.5 kg (168 lb 10.4 oz) 80 kg (176 lb 5.9 oz) 78.5 kg (173 lb 1 oz)    Exam:   General:  NAD  Cardiovascular: RRR  Respiratory: CTAB  Abdomen: Soft/NT/ND/+BS  Musculoskeletal: No c/c/e  Data Reviewed: Basic Metabolic Panel:  Recent Labs Lab 11/12/14 0734 11/12/14 1315 11/13/14 0620 11/14/14 0510 11/15/14 0630  NA 141  --  140 140 142  K 3.3*  --  3.8 3.9 4.1  CL 99  --  109 109 107  CO2 32  --  21 24 27   GLUCOSE 132*  --  157* 120* 160*  BUN 21  --  25* 29* 21  CREATININE 1.31* 1.14* 1.07 1.03 1.07  CALCIUM 9.0  --  8.7 8.8 8.6   Liver Function Tests:  Recent Labs Lab 11/15/14 0630  AST 28  ALT 32  ALKPHOS 42  BILITOT 0.5  PROT 5.8*  ALBUMIN 3.2*   No results for input(s): LIPASE, AMYLASE in the last 168 hours. No results for input(s): AMMONIA in the last 168 hours. CBC:  Recent Labs  Lab 11/12/14 0734 11/12/14 1315 11/13/14 0620 11/14/14 0510 11/15/14 0630  WBC 9.3 7.4 12.2* 12.6* 9.0  NEUTROABS 4.9  --   --   --   --   HGB 15.9* 14.6 13.1 12.7 12.8  HCT 47.9* 44.9 40.1 39.7 39.6  MCV 95.0 94.9 95.5 95.4 94.5  PLT 213 187 171 184 200   Cardiac Enzymes:  Recent Labs Lab 11/15/14 1040  TROPONINI <0.03   BNP (last 3 results)  Recent Labs  12/05/13 1154  PROBNP 101.9   CBG:  Recent Labs Lab 11/13/14 0749  GLUCAP 140*    Recent  Results (from the past 240 hour(s))  Culture, expectorated sputum-assessment     Status: None   Collection Time: 11/14/14  9:57 PM  Result Value Ref Range Status   Specimen Description SPUTUM  Final   Special Requests NONE  Final   Sputum evaluation   Final    MICROSCOPIC FINDINGS SUGGEST THAT THIS SPECIMEN IS NOT REPRESENTATIVE OF LOWER RESPIRATORY SECRETIONS. PLEASE RECOLLECT. NOTIFIED EMMANUEL CASTRO,RN 01.27.16 [place]    Report Status 11/14/2014 FINAL  Final     Studies: Dg Chest 2 View  11/14/2014   CLINICAL DATA:  Short of breath for 1 week  EXAM: CHEST  2 VIEW  COMPARISON:  11/13/2014  FINDINGS: Central and basilar opacities and increased. Kerley B-lines have increased. Small pleural effusions have developed. No pneumothorax. Normal heart size.  IMPRESSION: Increased bibasilar airspace disease, small pleural effusions, and interstitial infiltrates worrisome for edema. The heart size is normal.   Electronically Signed   By: Maryclare Bean M.D.   On: 11/14/2014 11:32    Scheduled Meds: . amitriptyline  10 mg Oral BID  . amoxicillin-clavulanate  1 tablet Oral 3 times per day  . FLUoxetine  20 mg Oral Daily  . furosemide  40 mg Intravenous Q12H  . heparin  5,000 Units Subcutaneous 3 times per day  . ipratropium-albuterol  3 mL Nebulization TID  . methylPREDNISolone (SOLU-MEDROL) injection  80 mg Intravenous 3 times per day  . metoprolol succinate  12.5 mg Oral BID  . mometasone-formoterol  2 puff Inhalation BID  . potassium chloride SA  20 mEq Oral BID  . sodium chloride  3 mL Intravenous Q12H   Continuous Infusions:   Principal Problem:   Acute respiratory failure with hypoxia Active Problems:   Asthma exacerbation   Acute exacerbation of CHF (congestive heart failure)   GERD (gastroesophageal reflux disease)   Hypertension   Unstable Gait   CAP (community acquired pneumonia)   Dyspnea   Asthma   Hypokalemia    Time spent: 37  mins    [facility] MD [facility] Pager [phone]. If 7PM-7AM, please contact night-coverage at [email or web address], password [facility] 11/15/2014, 5:07 PM  LOS: 3 days

## 2014-11-15 NOTE — Progress Notes (Signed)
Echocardiogram completed.

## 2014-11-15 NOTE — Progress Notes (Signed)
Physical Therapy Treatment Patient Details Name: Marie [name] MRN: [id number] DOB: 26-Dec-1935 Today's Date: 11/15/2014    History of Present Illness Pt is a 79 y.o. female, with a hx of chronic bronchitis and diastolic heart failure who came in today complaining of generalized weakness, SOB, and a cough over the past week or 2 which is getting progressively worse. She has been coughing up yellow sputum but denies hemoptysis.She says exertion makes the SOB worse but resting makes it better after a few minutes.    PT Comments    Pt progressing towards physical therapy goals. Was able to ambulate full length of the unit (480') on RA and sats remained 92-93%. Tolerated therapeutic exercise without report of SOB, pain, or fatigue. Encouraged RW use at all times at home until balance improves. Will continue to follow and progress as able per POC.   Follow Up Recommendations  Outpatient PT;Other (comment) (Higher level balance training)     Equipment Recommendations  None recommended by PT    Recommendations for Other Services       Precautions / Restrictions Precautions Precautions: Fall Restrictions Weight Bearing Restrictions: No    Mobility  Bed Mobility               General bed mobility comments: Pt sitting up in recliner upon PT arrival.   Transfers Overall transfer level: Needs assistance Equipment used: Rolling walker (2 wheeled) Transfers: Sit to/from Stand Sit to Stand: Min guard         General transfer comment: Pt was able to power up to full standing without assist. No unsteadiness noted.   Ambulation/Gait Ambulation/Gait assistance: Min guard Ambulation Distance (Feet): 480 Feet Assistive device: Rolling walker (2 wheeled);None Gait Pattern/deviations: Step-through pattern;Decreased stride length;Antalgic Gait velocity: Decreased Gait velocity interpretation: Below normal speed for age/gender General Gait Details: Pt anxious to try ambulation  without RW. HHA required during this time for balance, and encouraged pt to try the walker. With RW use, pt had no balance disturbances and demonstrated a much smoother gait pattern.    Stairs            Wheelchair Mobility    Modified Rankin (Stroke Patients Only)       Balance Overall balance assessment: Needs assistance Sitting-balance support: Feet supported;No upper extremity supported Sitting balance-Leahy Scale: Fair     Standing balance support: No upper extremity supported;During functional activity Standing balance-Leahy Scale: Fair                      Cognition Arousal/Alertness: Awake/alert Behavior During Therapy: WFL for tasks assessed/performed Overall Cognitive Status: Within Functional Limits for tasks assessed                      Exercises General Exercises - Lower Extremity Ankle Circles/Pumps: 10 reps Quad Sets: 15 reps Long Arc Quad: 15 reps Hip ABduction/ADduction: 15 reps    General Comments        Pertinent Vitals/Pain Pain Assessment: No/denies pain    Home Living                      Prior Function            PT Goals (current goals can now be found in the care plan section) Acute Rehab PT Goals Patient Stated Goal: Return home as soon as possible PT Goal Formulation: With patient Time For Goal Achievement: 11/20/14 Potential to Achieve Goals: Good Progress  towards PT goals: Progressing toward goals    Frequency  Min 3X/week    PT Plan Current plan remains appropriate    Co-evaluation             End of Session Equipment Utilized During Treatment: Gait belt Activity Tolerance: Patient tolerated treatment well Patient left: in chair;with call bell/phone within reach     Time: 1415-1439 PT Time Calculation (min) (ACUTE ONLY): 24 min  Charges:  $Gait Training: 8-22 mins $Therapeutic Exercise: 8-22 mins                    G Codes:      Rolinda Roan November 26, 2014, 3:01 PM   Rolinda Roan, PT, DPT Acute Rehabilitation Services Pager: [phone]

## 2014-11-16 LAB — BASIC METABOLIC PANEL
Anion gap: 3 — ABNORMAL LOW (ref 5–15)
BUN: 32 mg/dL — ABNORMAL HIGH (ref 6–23)
CALCIUM: 8.6 mg/dL (ref 8.4–10.5)
CHLORIDE: 104 mmol/L (ref 96–112)
CO2: 36 mmol/L — ABNORMAL HIGH (ref 19–32)
CREATININE: 1.23 mg/dL — AB (ref 0.50–1.10)
GFR calc Af Amer: 48 mL/min — ABNORMAL LOW (ref >90)
GFR calc non Af Amer: 42 mL/min — ABNORMAL LOW (ref >90)
Glucose, Bld: 145 mg/dL — ABNORMAL HIGH (ref 70–99)
Potassium: 3.6 mmol/L (ref 3.5–5.1)
Sodium: 143 mmol/L (ref 135–145)

## 2014-11-16 LAB — BRAIN NATRIURETIC PEPTIDE: B Natriuretic Peptide: 485.1 pg/mL — ABNORMAL HIGH (ref 0.0–100.0)

## 2014-11-16 LAB — CBC
HCT: 38.8 % (ref 36.0–46.0)
Hemoglobin: 12.5 g/dL (ref 12.0–15.0)
MCH: 30.4 pg (ref 26.0–34.0)
MCHC: 32.2 g/dL (ref 30.0–36.0)
MCV: 94.4 fL (ref 78.0–100.0)
Platelets: 217 10*3/uL (ref 150–400)
RBC: 4.11 MIL/uL (ref 3.87–5.11)
RDW: 13.7 % (ref 11.5–15.5)
WBC: 12.3 10*3/uL — AB (ref 4.0–10.5)

## 2014-11-16 MED ORDER — FUROSEMIDE 40 MG PO TABS
40.0000 mg | ORAL_TABLET | Freq: Every day | ORAL | Status: DC
  Administered 2014-11-16: 40 mg via ORAL
  Filled 2014-11-16: qty 1

## 2014-11-16 MED ORDER — AMOXICILLIN-POT CLAVULANATE 875-125 MG PO TABS: 1.0000 | ORAL_TABLET | Freq: Two times a day (BID) | ORAL | Status: DC

## 2014-11-16 MED ORDER — FLUOXETINE HCL 20 MG PO CAPS: 20.0000 mg | ORAL_CAPSULE | Freq: Every day | ORAL | Status: DC

## 2014-11-16 MED ORDER — PREDNISONE 20 MG PO TABS: 60.0000 mg | ORAL_TABLET | Freq: Every day | ORAL | Status: DC

## 2014-11-16 MED ORDER — FUROSEMIDE 40 MG PO TABS: 40.0000 mg | ORAL_TABLET | Freq: Every day | ORAL | Status: DC

## 2014-11-16 NOTE — Discharge Summary (Signed)
Physician Discharge Summary  Marie [name] GNF:[id number] DOB: [birth year] DOA: 11/12/2014  PCP: Marie Richards, MD  Admit date: 11/12/2014 Discharge date: 11/16/2014  Time spent: 70 minutes  Recommendations for Outpatient Follow-up:  1. Patient be discharged home with outpatient PT. 2. Follow-up with Marie DAVID, MD in 1 week. On follow-up patient needs a basic metabolic profile done to follow-up on electrolytes and renal function. Patient's ARB- HCTZ was discontinued. Patient's blood pressure remained well controlled on Lopressor and Lasix. Outpatient follow-up. Patient's oxybutynin was also discontinued secondary to unsteady gait with improvement in her gait and this will need to be reassessed per PCP. Patient was also noted to have a prolonged QTC and a such her SSRI was decreased to 20 mg daily. This would need to be further assessed per PCP to determine whether she needs to be tapered off all together or maintained on this lower dose. Patient will need a repeat EKG to follow-up on QTC prolongation. 3.   Discharge Diagnoses:  Principal Problem:   Acute respiratory failure with hypoxia Active Problems:   Asthma exacerbation   Acute exacerbation of CHF (congestive heart failure)   GERD (gastroesophageal reflux disease)   Hypertension   Unstable Gait   CAP (community acquired pneumonia)   Dyspnea   Asthma   Hypokalemia   Discharge Condition: stable and improved  Diet recommendation: heart healthy  Filed Weights   11/13/14 2100 11/14/14 2147 11/15/14 2100  Weight: 80 kg (176 lb 5.9 oz) 78.5 kg (173 lb 1 oz) 78.1 kg (172 lb 2.9 oz)    History of present illness:  [name] is a 79 y.o. female, with a hx of chronic bronchitis and diastolic heart failure who came in on the day of admission complaining of generalized weakness, SOB, and a cough over the past week or 2 which is getting progressively worse. She denied fever, N/V, PND, chest pain, heart  palpitations, sick contacts or recent colds. She had been coughing up yellow sputum but denied hemoptysis. She said exertion made the SOB worse but resting made it better after a few minutes. She is not a smoker and she had never been around anyone who smoked. According to her, she had never been diagnosed with COPD or asthma. She was hospitalized 2 months ago for these same symptoms. She had her flu shot this year.    Hospital Course:  #1 acute respiratory failure Likely secondary to acute diastolic CHF exacerbation plus or minus asthma exacerbation with bronchitis. Patient was initially admitted placed on IV steroids, antibiotics, nebulizer treatments. Patient still did have some significant wheezing with hypoxia. Repeat chest x-ray was obtained which was consistent with volume overload. ProBNP was also obtained which was elevated at 712. Patient was placed on IV Lasix and diuresed well and improved clinically. Cardiac enzymes which was cycled were negative 3. 2-D echo which was obtained had a EF of 65-70% with no wall motion abnormalities. Patient's renal function was followed. IV steroids were quickly tapered to oral steroids and patient be discharged home on a steroid taper. Patient was maintained on antibiotics and will need 1 more day of Augmentin to complete a five-day course of antibiotic therapy. Patient will be discharged home on Lasix 40 mg daily. Patient will need close outpatient follow-up. On day of discharge patient was satting 95% on room air and on ambulation. Patient was discharged in stable and improved condition.   #2 acute diastolic CHF exacerbation Questionable etiology. Patient denied any prior history of heart failure. However  patient states was recently discontinued off her Lasix by her PCP approximately one to 2 months prior to admission. Cardiac enzymes were cycled which were negative 3. Pro BNP which was obtained was initially elevated at 712. Patient was diuresed with  IV Lasix with clinical improvement and trending down of her proBNP. On day of discharge proBNP was down to 485. Patient was transitioned to oral Lasix. 2-D echo which was obtained had a EF of 65-70% with decreased left ventricular diastolic compliance. Patient improved clinically and be discharged home on Lasix 40 mg daily. Patient will need close outpatient follow-up with PCP.   #3 probable asthma exacerbation On admission there was some concern for probable asthma exacerbation. Patient was placed empirically on IV antibiotics, IV steroids which were tapered to oral steroids, scheduled nebulizer treatments. Patient improved clinically. Patient be discharged from 1 more day of oral Augmentin to complete a five-day course of antibiotic therapy. Patient also be discharged on a steroid taper.   #4 acute renal failure Likely secondary to prerenal azotemia secondary to problem #2. Improved with diuresis. Patient's ARB was discontinued. Patient had been placed on IV Lasix for acute CHF exacerbation. Patient was subsequently transitioned to oral Lasix. On day of discharge patient's creatinine was 1.23. Outpatient follow-up. Follow.  #5 hypokalemia Repleted. Will need outpatient follow-up  #6 unstable gait Felt secondary to oxybutynin. Patient's oxybutynin was discontinued during the hospitalization with improvement in gait. PT/OT. Outpatient PT  #7 hypertension Stable. Patient was maintained on Lopressor throughout the hospitalization. Patient's ARB HCTZ was discontinued. Patient's blood pressures remained stable and patient will be discharged home on Lopressor and Lasix. Outpatient follow-up.  #8 prolonged QTC Repeat EKG showed improvement of prolonged QTC now at 483 from greater than 500. SSRI dose has been decreased. Taper off as outpatient.  #9 gastroesophageal reflux disease Maalox/Mylanta. PPI.  Procedures:  Chest x-ray 11/14/2014, 11/13/2014  2-D echo  11/15/2014    Consultations:  None  Discharge Exam: Filed Vitals:   11/16/14 0836  BP: 120/59  Pulse: 70  Temp: 97.7 F (36.5 C)  Resp: 18    General: NAD Cardiovascular: RRR Respiratory: CTAB  Discharge Instructions   Discharge Instructions    Diet - low sodium heart healthy    Complete by:  As directed      Discharge instructions    Complete by:  As directed   Follow up with Marie Richards, MD early next week.     Increase activity slowly    Complete by:  As directed           Current Discharge Medication List    START taking these medications   Details  amoxicillin-clavulanate (AUGMENTIN) 875-125 MG per tablet Take 1 tablet by mouth every 12 (twelve) hours. Take for 1 day then stop. Qty: 2 tablet, Refills: 0    furosemide (LASIX) 40 MG tablet Take 1 tablet (40 mg total) by mouth daily. Qty: 30 tablet, Refills: 0      CONTINUE these medications which have CHANGED   Details  FLUoxetine (PROZAC) 20 MG capsule Take 1 capsule (20 mg total) by mouth daily. Qty: 30 capsule, Refills: 0    predniSONE (DELTASONE) 20 MG tablet Take 3 tablets (60 mg total) by mouth daily before breakfast. Take 3 tablets (60mg ) daily x 2 days, then 2 tablets (40mg ) daily x 2 days, then 1 tablet (20mg ) daily x 2 days then stop. Qty: 12 tablet, Refills: 0      CONTINUE these medications which have NOT  CHANGED   Details  acetaminophen (TYLENOL) 325 MG tablet Take 1-2 tablets (325-650 mg total) by mouth every 6 (six) hours as needed. Qty: 90 tablet, Refills: 0    ADVAIR DISKUS 250-50 MCG/DOSE AEPB INHALE ONE PUFF BY MOUTH TWICE DAILY Qty: 60 each, Refills: 6    albuterol (PROVENTIL) (2.5 MG/3ML) 0.083% nebulizer solution Take 3 mLs (2.5 mg total) by nebulization every 6 (six) hours as needed for wheezing or shortness of breath. Qty: 75 mL, Refills: 4   Associated Diagnoses: Dyspnea; Bronchitis, chronic obstructive w acute bronchitis    amitriptyline (ELAVIL) 10 MG tablet  Take 1 tablet (10 mg total) by mouth 3 (three) times daily. Qty: 270 tablet, Refills: prn    aspirin 81 MG tablet Take 81 mg by mouth daily.     cholecalciferol (VITAMIN D) 1000 UNITS tablet Take 5 tablets (5,000 Units total) by mouth daily.    metoprolol succinate (TOPROL-XL) 25 MG 24 hr tablet Take 12.5 mg by mouth 2 (two) times daily.    Multiple Vitamin (MULTIVITAMIN) capsule Take 1 capsule by mouth daily.     omeprazole (PRILOSEC) 40 MG capsule TAKE ONE CAPSULE BY MOUTH TWICE DAILY FOR ACID REFLUX Qty: 180 capsule, Refills: 11    potassium chloride SA (K-DUR,KLOR-CON) 20 MEQ tablet Take 20 mEq by mouth 2 (two) times daily.    pravastatin (PRAVACHOL) 40 MG tablet TAKE ONE TABLET BY MOUTH ONCE DAILY FOR CHOLESTEROL Qty: 90 tablet, Refills: 11    traMADol (ULTRAM) 50 MG tablet Take 1-2 tablets (50-100 mg total) by mouth every 6 (six) hours as needed for moderate pain. Qty: 50 tablet, Refills: 0    HYDROcodone-acetaminophen (NORCO) 5-325 MG per tablet Take 1-2 tablets by mouth every 6 (six) hours as needed for moderate pain. Qty: 40 tablet, Refills: 0    nystatin (MYCOSTATIN) 100000 UNIT/ML suspension Swish and spit 5 ml QID for two days after symptoms resolve. Qty: 240 mL, Refills: 3      STOP taking these medications     losartan-hydrochlorothiazide (HYZAAR) 100-25 MG per tablet      oxybutynin (DITROPAN) 5 MG tablet      amoxicillin (AMOXIL) 250 MG capsule        Allergies  Allergen Reactions  . Codeine Other (See Comments)    hallucinations  . Demerol Other (See Comments)    hallucinations  . Morphine And Related Other (See Comments)    unknown  . Other Other (See Comments)    Invan 7- pt became red all over  . Sulfate Other (See Comments)    unknown   Follow-up Information    Follow up with Marie Richards, MD. Schedule an appointment as soon as possible for a visit in 1 week.   Specialty:  Internal Medicine   Contact information:   [address] [place] [address] [phone]        The results of significant diagnostics from this hospitalization (including imaging, microbiology, ancillary and laboratory) are listed below for reference.    Significant Diagnostic Studies: Dg Chest 2 View  11/14/2014   CLINICAL DATA:  Short of breath for 1 week  EXAM: CHEST  2 VIEW  COMPARISON:  11/13/2014  FINDINGS: Central and basilar opacities and increased. Kerley B-lines have increased. Small pleural effusions have developed. No pneumothorax. Normal heart size.  IMPRESSION: Increased bibasilar airspace disease, small pleural effusions, and interstitial infiltrates worrisome for edema. The heart size is normal.   Electronically Signed   By: Art  Hoss M.D.   On: 11/14/2014 11:32   X-ray Chest Pa And Lateral  11/13/2014   CLINICAL DATA:  Pneumonia, shortness of Breath  EXAM: CHEST - 2 VIEW  COMPARISON:  11/12/2014  FINDINGS: Mild diffuse interstitial prominence, increased since prior study. No confluent airspace disease. Heart size remains normal. Atheromatous aorta. No effusion. Minimal spurring in the lower thoracic spine.  IMPRESSION: 1. Mild interstitial infiltrate or edema   Electronically Signed   By: Arne Cleveland M.D.   On: 11/13/2014 08:15   Dg Chest Portable 1 View  11/12/2014   CLINICAL DATA:  Dyspnea with cough and congestion. Initial encounter.  EXAM: PORTABLE CHEST - 1 VIEW  COMPARISON:  09/03/2014 and 01/15/2014 radiographs.  FINDINGS: 0736 hr. There are lower lung volumes with mild resulting bibasilar atelectasis. The lungs are otherwise clear. The heart size and mediastinal contours are stable. There is no pleural effusion or pneumothorax. The bones appear unremarkable. Telemetry leads overlie the chest.  IMPRESSION: Mild bibasilar atelectasis.  Otherwise stable examination.   Electronically Signed   By: Camie Patience M.D.   On: 11/12/2014 07:51    Microbiology: Recent Results (from the past 240 hour(s))   Culture, expectorated sputum-assessment     Status: None   Collection Time: 11/14/14  9:57 PM  Result Value Ref Range Status   Specimen Description SPUTUM  Final   Special Requests NONE  Final   Sputum evaluation   Final    MICROSCOPIC FINDINGS SUGGEST THAT THIS SPECIMEN IS NOT REPRESENTATIVE OF LOWER RESPIRATORY SECRETIONS. PLEASE RECOLLECT. NOTIFIED EMMANUEL CASTRO,RN 01.27.16 [place]    Report Status 11/14/2014 FINAL  Final     Labs: Basic Metabolic Panel:  Recent Labs Lab 11/12/14 0734 11/12/14 1315 11/13/14 0620 11/14/14 0510 11/15/14 0630 11/16/14 0605  NA 141  --  140 140 142 143  K 3.3*  --  3.8 3.9 4.1 3.6  CL 99  --  109 109 107 104  CO2 32  --  21 24 27  36*  GLUCOSE 132*  --  157* 120* 160* 145*  BUN 21  --  25* 29* 21 32*  CREATININE 1.31* 1.14* 1.07 1.03 1.07 1.23*  CALCIUM 9.0  --  8.7 8.8 8.6 8.6   Liver Function Tests:  Recent Labs Lab 11/15/14 0630  AST 28  ALT 32  ALKPHOS 42  BILITOT 0.5  PROT 5.8*  ALBUMIN 3.2*   No results for input(s): LIPASE, AMYLASE in the last 168 hours. No results for input(s): AMMONIA in the last 168 hours. CBC:  Recent Labs Lab 11/12/14 0734 11/12/14 1315 11/13/14 0620 11/14/14 0510 11/15/14 0630 11/16/14 0605  WBC 9.3 7.4 12.2* 12.6* 9.0 12.3*  NEUTROABS 4.9  --   --   --   --   --   HGB 15.9* 14.6 13.1 12.7 12.8 12.5  HCT 47.9* 44.9 40.1 39.7 39.6 38.8  MCV 95.0 94.9 95.5 95.4 94.5 94.4  PLT 213 187 171 184 200 217   Cardiac Enzymes:  Recent Labs Lab 11/15/14 1040 11/15/14 1630 11/15/14 2253  TROPONINI <0.03 <0.03 <0.03   BNP: BNP (last 3 results)  Recent Labs  12/05/13 1154  PROBNP 101.9   CBG:  Recent Labs Lab 11/13/14 0749  GLUCAP 140*       Signed:  THOMPSON,DANIEL MD [facility] 11/16/2014, 2:13 PM

## 2014-11-16 NOTE — Care Management Note (Signed)
CARE MANAGEMENT NOTE 11/16/2014  Patient:  Marie [name], Marie [name]   Account Number:  [id number]  Date Initiated:  11/16/2014  Documentation initiated by:  Loudon Krakow  Subjective/Objective Assessment:   CM following for progression and d/c planning.     Action/Plan:   Pt will d/c to home , outpatient rehab requested at [facility]. Info faxed and they will contact the pt at home with an appointment time. Pt informed as she selected [facility] for these services.   Anticipated DC Date:  11/16/2014   Anticipated DC Plan:  HOME/SELF CARE         Choice offered to / List presented to:             Status of service:  Completed, signed off Medicare Important Message given?  YES (If response is "NO", the following Medicare IM given date fields will be blank) Date Medicare IM given:  11/16/2014 Medicare IM given by:  Jahlon Baines Date Additional Medicare IM given:   Additional Medicare IM given by:    Discharge Disposition:  HOME/SELF CARE  Per UR Regulation:    If discussed at Long Length of Stay Meetings, dates discussed:    Comments:

## 2014-11-20 ENCOUNTER — Encounter: Payer: Self-pay | Admitting: Internal Medicine

## 2014-11-20 ENCOUNTER — Ambulatory Visit (INDEPENDENT_AMBULATORY_CARE_PROVIDER_SITE_OTHER): Payer: Medicare Other | Admitting: Internal Medicine

## 2014-11-20 VITALS — BP 122/74 | HR 92 | Temp 98.1°F | Resp 16 | Ht 64.0 in | Wt 168.6 lb

## 2014-11-20 DIAGNOSIS — R7309 Other abnormal glucose: Secondary | ICD-10-CM | POA: Diagnosis not present

## 2014-11-20 DIAGNOSIS — R7303 Prediabetes: Secondary | ICD-10-CM

## 2014-11-20 DIAGNOSIS — Z79899 Other long term (current) drug therapy: Secondary | ICD-10-CM

## 2014-11-20 DIAGNOSIS — I10 Essential (primary) hypertension: Secondary | ICD-10-CM

## 2014-11-20 DIAGNOSIS — E785 Hyperlipidemia, unspecified: Secondary | ICD-10-CM | POA: Diagnosis not present

## 2014-11-20 NOTE — Progress Notes (Signed)
Subjective:    Patient ID: Marie [name], female    DOB: 02-Jun-1936, 79 y.o.   MRN: [id number]  HPI Patient here for post hospital f/u after a hospitalization 1/25-29 for an apparent exacerbation of Chronic Bronchitis with asthmatic bronchitis . Since hospitalization patient has remained improved and sx free. Medication Sig  . acetaminophen (TYLENOL) 325 MG tablet Take 1-2 tablets (325-650 mg total) by mouth every 6 (six) hours as needed.  Marland Kitchen ADVAIR DISKUS 250-50 MCG/DOSE AEPB INHALE ONE PUFF BY MOUTH TWICE DAILY  . albuterol  (2.5 MG/3ML) 0.083% nebulizer  Take 3 mLs  by nebevery 6 (six) hours as needed   . aspirin 81 MG tablet Take 81 mg by mouth daily.   Marland Kitchen VITAMIN D 1000 UNITS tablet Take 5 tablets (5,000 Units total) by mouth daily.  . furosemide (LASIX) 40 MG tablet Take 1 tablet (40 mg total) by mouth daily.  .  (NORCO) 5-325 MG per tablet Take 1-2 tablets by mouth every 6 (six) hours as needed for moderate pain.  . metoprolol succinate (TOPROL-XL) 25 MG 24 hr tablet Take 12.5 mg by mouth 2 (two) times daily.  . Multiple Vitamin (MULTIVITAMIN) capsule Take 1 capsule by mouth daily.   Marland Kitchen nystatin (MYCOSTATIN) 100000 UNIT/ML suspension Swish and spit 5 ml QID for two days after symptoms resolve.  Marland Kitchen omeprazole (PRILOSEC) 40 MG capsule TAKE ONE CAPSULE BY MOUTH TWICE DAILY FOR ACID REFLUX  . potassium chloride SA (K-DUR,KLOR-CON) 20 MEQ tablet Take 20 mEq by mouth 2 (two) times daily.  . pravastatin (PRAVACHOL) 40 MG tablet TAKE ONE TABLET BY MOUTH ONCE DAILY FOR CHOLESTEROL  . amitriptyline (ELAVIL) 10 MG tablet Take 1 tablet (10 mg total) by mouth 3 (three) times daily. (Patient not taking: Reported on 11/20/2014)  . FLUoxetine (PROZAC) 20 MG capsule Take 1 capsule (20 mg total) by mouth daily. (Patient not taking: Reported on 11/20/2014)  . traMADol (ULTRAM) 50 MG tablet Take 1-2 tablets (50-100 mg total) by mouth every 6 (six) hours as needed for moderate pain. (Patient not taking: Reported  on 11/20/2014)   Allergies  Allergen Reactions  . Codeine Other (See Comments)    hallucinations  . Demerol Other (See Comments)    hallucinations  . Morphine And Related Other (See Comments)    unknown  . Other Other (See Comments)    Invan 7- pt became red all over  . Sulfate Other (See Comments)    unknown   Past Medical History  Diagnosis Date  . Incontinence     not indicated at this visit.  . Wears dentures   . Osteoporosis   . Bulging disc   . Hyperlipidemia   . Chronic bronchitis     due to congestion at times, on prednisone and advair  . GERD (gastroesophageal reflux disease)   . Neuropathy     legs stay numb   . Depression     many years ago  . Hypertension     followed by intern med Dr. Marisue [place] [phone]  . Tubulovillous adenoma of rectum   . Balance disorder 2008.      Falls a lot.  She can be standing and then leans too far over to one side   . Wears glasses   . Hearing loss     mild  . Iatrogenic adrenal insufficiency   . Osteoarthritis     hands,   . COPD (chronic obstructive pulmonary disease)   . Shortness of breath dyspnea  Past Surgical History  Procedure Laterality Date  . Incontinence surgery      multiple procedures, not cured  . Rectal surgery      by dr. Marlou Starks, removal of polyp  . Appendectomy    . Abdominal hysterectomy    . Tonsillectomy    . Eye surgery      bilateral cataract surgery and lens implant  . Dilation and curettage of uterus    . Rectal biopsy  09/21/2011    Procedure: BIOPSY RECTAL;  Surgeon: Merrie Roof, MD;  Location: [facility];  Service: General;  Laterality: N/A;  3-4 cm  . Finger surgery      fusions and debridements for OA  . External fixation leg  10/25/2012    Procedure: EXTERNAL FIXATION LEG;  Surgeon: Rozanna Box, MD;  Location: [place];  Service: Orthopedics;  Laterality: Right;  . Knee arthroscopy with medial menisectomy Right 09/19/2014    Procedure: RIGHT KNEE ARTHROSCOPY WITH MEDIAL AND LATERAL  MENISECTOMIES. CHONDROPLASTY OF PATELLA-FEMORAL JOINT;  Surgeon: Alta Corning, MD;  Location: [place];  Service: Orthopedics;  Laterality: Right;  . Knee arthroscopy with lateral menisectomy Right 09/19/2014    Procedure: KNEE ARTHROSCOPY WITH LATERAL MENISECTOMY;  Surgeon: Alta Corning, MD;  Location: [place];  Service: Orthopedics;  Laterality: Right;  . Chondroplasty  09/19/2014    Procedure: CHONDROPLASTY;  Surgeon: Alta Corning, MD;  Location: [place];  Service: Orthopedics;;   Review of Systems  10 point systems review negative as to above.    Objective:   Physical Exam BP 122/74 mmHg  Pulse 92  Temp(Src) 98.1 F (36.7 C)  Resp 16  Ht 5\' 4"  (1.626 m)  Wt 168 lb 9.6 oz (76.476 kg)  BMI 28.93 kg/m2  HEENT - Eac's patent. TM's Nl. EOM's full. PERRLA. NasoOroPharynx clear. Neck - supple. Nl Thyroid. Carotids 2+ & No bruits, nodes, JVD Chest - Distant & clear BS w/o Rales, rhonchi, wheezes. Cor - Nl HS. RRR w/o sig MGR. PP 1(+). No edema. MS- FROM w/o deformities. Muscle power, tone and bulk Nl. Gait Nl. Neuro - No obvious Cr N abnormalities. Sensory, motor and Cerebellar functions appear Nl w/o focal abnormalities. Psyche - Mental status normal & appropriate.  No delusions, ideations or obvious mood abnormalities.    Assessment & Plan:   1. Essential hypertension  - EKG 12-Lead Nl QT in f/u prolonged QT during hospitalization  2. Hyperlipidemia  3. Prediabetes  4. Medication management  - BASIC METABOLIC PANEL WITH GFR - CBC with Differential/Platelet  - Discussed diet, meds/SE's & exercise

## 2014-11-21 ENCOUNTER — Other Ambulatory Visit: Payer: Self-pay | Admitting: *Deleted

## 2014-11-21 DIAGNOSIS — J440 Chronic obstructive pulmonary disease with acute lower respiratory infection: Secondary | ICD-10-CM

## 2014-11-21 DIAGNOSIS — R0600 Dyspnea, unspecified: Secondary | ICD-10-CM

## 2014-11-21 LAB — CBC WITH DIFFERENTIAL/PLATELET
BASOS ABS: 0 10*3/uL (ref 0.0–0.1)
BASOS PCT: 0 % (ref 0–1)
EOS ABS: 0 10*3/uL (ref 0.0–0.7)
Eosinophils Relative: 0 % (ref 0–5)
HCT: 47.6 % — ABNORMAL HIGH (ref 36.0–46.0)
HEMOGLOBIN: 15.9 g/dL — AB (ref 12.0–15.0)
Lymphocytes Relative: 6 % — ABNORMAL LOW (ref 12–46)
Lymphs Abs: 1 10*3/uL (ref 0.7–4.0)
MCH: 30.7 pg (ref 26.0–34.0)
MCHC: 33.4 g/dL (ref 30.0–36.0)
MCV: 91.9 fL (ref 78.0–100.0)
MPV: 10.2 fL (ref 8.6–12.4)
Monocytes Absolute: 0.5 10*3/uL (ref 0.1–1.0)
Monocytes Relative: 3 % (ref 3–12)
NEUTROS ABS: 15.1 10*3/uL — AB (ref 1.7–7.7)
NEUTROS PCT: 91 % — AB (ref 43–77)
Platelets: 372 10*3/uL (ref 150–400)
RBC: 5.18 MIL/uL — ABNORMAL HIGH (ref 3.87–5.11)
RDW: 13.8 % (ref 11.5–15.5)
WBC: 16.6 10*3/uL — AB (ref 4.0–10.5)

## 2014-11-21 LAB — BASIC METABOLIC PANEL WITH GFR
BUN: 33 mg/dL — ABNORMAL HIGH (ref 6–23)
CALCIUM: 9.2 mg/dL (ref 8.4–10.5)
CO2: 31 mEq/L (ref 19–32)
Chloride: 97 mEq/L (ref 96–112)
Creat: 1.19 mg/dL — ABNORMAL HIGH (ref 0.50–1.10)
GFR, EST AFRICAN AMERICAN: 51 mL/min — AB
GFR, EST NON AFRICAN AMERICAN: 44 mL/min — AB
Glucose, Bld: 194 mg/dL — ABNORMAL HIGH (ref 70–99)
POTASSIUM: 4.4 meq/L (ref 3.5–5.3)
Sodium: 141 mEq/L (ref 135–145)

## 2014-11-21 MED ORDER — ALBUTEROL SULFATE (2.5 MG/3ML) 0.083% IN NEBU: 2.5000 mg | INHALATION_SOLUTION | Freq: Four times a day (QID) | RESPIRATORY_TRACT | Status: DC | PRN

## 2014-11-26 DIAGNOSIS — D128 Benign neoplasm of rectum: Secondary | ICD-10-CM | POA: Diagnosis not present

## 2014-11-26 DIAGNOSIS — M1990 Unspecified osteoarthritis, unspecified site: Secondary | ICD-10-CM | POA: Diagnosis not present

## 2014-11-26 DIAGNOSIS — J449 Chronic obstructive pulmonary disease, unspecified: Secondary | ICD-10-CM | POA: Diagnosis not present

## 2014-11-26 DIAGNOSIS — I5030 Unspecified diastolic (congestive) heart failure: Secondary | ICD-10-CM | POA: Diagnosis not present

## 2014-11-26 DIAGNOSIS — K621 Rectal polyp: Secondary | ICD-10-CM | POA: Diagnosis not present

## 2014-11-26 DIAGNOSIS — Z885 Allergy status to narcotic agent status: Secondary | ICD-10-CM | POA: Diagnosis not present

## 2014-11-26 DIAGNOSIS — Z888 Allergy status to other drugs, medicaments and biological substances status: Secondary | ICD-10-CM | POA: Diagnosis not present

## 2014-11-26 DIAGNOSIS — I10 Essential (primary) hypertension: Secondary | ICD-10-CM | POA: Diagnosis not present

## 2014-11-26 DIAGNOSIS — K219 Gastro-esophageal reflux disease without esophagitis: Secondary | ICD-10-CM | POA: Diagnosis not present

## 2014-11-28 ENCOUNTER — Ambulatory Visit (HOSPITAL_COMMUNITY): Payer: Medicare Other | Admitting: Physical Therapy

## 2014-11-29 ENCOUNTER — Ambulatory Visit: Payer: Self-pay | Admitting: Physician Assistant

## 2014-12-03 ENCOUNTER — Encounter (HOSPITAL_COMMUNITY): Payer: Self-pay | Admitting: *Deleted

## 2014-12-03 ENCOUNTER — Emergency Department (HOSPITAL_COMMUNITY)
Admission: EM | Admit: 2014-12-03 | Discharge: 2014-12-03 | Disposition: A | Discharge status: Home / Self Care | Payer: Medicare Other | Attending: Emergency Medicine | Admitting: Emergency Medicine

## 2014-12-03 DIAGNOSIS — R112 Nausea with vomiting, unspecified: Secondary | ICD-10-CM | POA: Diagnosis not present

## 2014-12-03 DIAGNOSIS — H9190 Unspecified hearing loss, unspecified ear: Secondary | ICD-10-CM | POA: Insufficient documentation

## 2014-12-03 DIAGNOSIS — Z7982 Long term (current) use of aspirin: Secondary | ICD-10-CM | POA: Diagnosis not present

## 2014-12-03 DIAGNOSIS — M1990 Unspecified osteoarthritis, unspecified site: Secondary | ICD-10-CM | POA: Diagnosis not present

## 2014-12-03 DIAGNOSIS — R404 Transient alteration of awareness: Secondary | ICD-10-CM | POA: Diagnosis not present

## 2014-12-03 DIAGNOSIS — E785 Hyperlipidemia, unspecified: Secondary | ICD-10-CM | POA: Insufficient documentation

## 2014-12-03 DIAGNOSIS — F329 Major depressive disorder, single episode, unspecified: Secondary | ICD-10-CM | POA: Insufficient documentation

## 2014-12-03 DIAGNOSIS — Z9889 Other specified postprocedural states: Secondary | ICD-10-CM | POA: Insufficient documentation

## 2014-12-03 DIAGNOSIS — E860 Dehydration: Secondary | ICD-10-CM | POA: Insufficient documentation

## 2014-12-03 DIAGNOSIS — I10 Essential (primary) hypertension: Secondary | ICD-10-CM | POA: Diagnosis not present

## 2014-12-03 DIAGNOSIS — R197 Diarrhea, unspecified: Secondary | ICD-10-CM | POA: Diagnosis not present

## 2014-12-03 DIAGNOSIS — J449 Chronic obstructive pulmonary disease, unspecified: Secondary | ICD-10-CM | POA: Diagnosis not present

## 2014-12-03 DIAGNOSIS — Z79899 Other long term (current) drug therapy: Secondary | ICD-10-CM | POA: Insufficient documentation

## 2014-12-03 DIAGNOSIS — R1110 Vomiting, unspecified: Secondary | ICD-10-CM | POA: Diagnosis present

## 2014-12-03 DIAGNOSIS — K529 Noninfective gastroenteritis and colitis, unspecified: Secondary | ICD-10-CM | POA: Diagnosis not present

## 2014-12-03 DIAGNOSIS — K219 Gastro-esophageal reflux disease without esophagitis: Secondary | ICD-10-CM | POA: Insufficient documentation

## 2014-12-03 LAB — COMPREHENSIVE METABOLIC PANEL
ALBUMIN: 3.5 g/dL (ref 3.5–5.2)
ALK PHOS: 50 U/L (ref 39–117)
ALT: 34 U/L (ref 0–35)
AST: 38 U/L — AB (ref 0–37)
Anion gap: 11 (ref 5–15)
BUN: 22 mg/dL (ref 6–23)
CALCIUM: 8.7 mg/dL (ref 8.4–10.5)
CO2: 29 mmol/L (ref 19–32)
Chloride: 100 mmol/L (ref 96–112)
Creatinine, Ser: 1.41 mg/dL — ABNORMAL HIGH (ref 0.50–1.10)
GFR calc non Af Amer: 35 mL/min — ABNORMAL LOW (ref >90)
GFR, EST AFRICAN AMERICAN: 41 mL/min — AB (ref >90)
Glucose, Bld: 133 mg/dL — ABNORMAL HIGH (ref 70–99)
Potassium: 2.8 mmol/L — ABNORMAL LOW (ref 3.5–5.1)
Sodium: 140 mmol/L (ref 135–145)
Total Bilirubin: 1 mg/dL (ref 0.3–1.2)
Total Protein: 6.3 g/dL (ref 6.0–8.3)

## 2014-12-03 LAB — URINALYSIS, ROUTINE W REFLEX MICROSCOPIC
Bilirubin Urine: NEGATIVE
Glucose, UA: NEGATIVE mg/dL
Ketones, ur: NEGATIVE mg/dL
Nitrite: NEGATIVE
PH: 7.5 (ref 5.0–8.0)
PROTEIN: NEGATIVE mg/dL
Specific Gravity, Urine: 1.012 (ref 1.005–1.030)
Urobilinogen, UA: 0.2 mg/dL (ref 0.0–1.0)

## 2014-12-03 LAB — CBC WITH DIFFERENTIAL/PLATELET
BASOS PCT: 0 % (ref 0–1)
Basophils Absolute: 0 10*3/uL (ref 0.0–0.1)
EOS ABS: 0 10*3/uL (ref 0.0–0.7)
EOS PCT: 0 % (ref 0–5)
HCT: 48.3 % — ABNORMAL HIGH (ref 36.0–46.0)
Hemoglobin: 16.5 g/dL — ABNORMAL HIGH (ref 12.0–15.0)
LYMPHS PCT: 14 % (ref 12–46)
Lymphs Abs: 1.4 10*3/uL (ref 0.7–4.0)
MCH: 31.3 pg (ref 26.0–34.0)
MCHC: 34.2 g/dL (ref 30.0–36.0)
MCV: 91.5 fL (ref 78.0–100.0)
Monocytes Absolute: 0.5 10*3/uL (ref 0.1–1.0)
Monocytes Relative: 5 % (ref 3–12)
Neutro Abs: 8.2 10*3/uL — ABNORMAL HIGH (ref 1.7–7.7)
Neutrophils Relative %: 81 % — ABNORMAL HIGH (ref 43–77)
Platelets: 228 10*3/uL (ref 150–400)
RBC: 5.28 MIL/uL — ABNORMAL HIGH (ref 3.87–5.11)
RDW: 13.7 % (ref 11.5–15.5)
WBC: 10.2 10*3/uL (ref 4.0–10.5)

## 2014-12-03 LAB — I-STAT TROPONIN, ED: Troponin i, poc: 0.03 ng/mL (ref 0.00–0.08)

## 2014-12-03 LAB — URINE MICROSCOPIC-ADD ON

## 2014-12-03 LAB — LIPASE, BLOOD: Lipase: 26 U/L (ref 11–59)

## 2014-12-03 MED ORDER — POTASSIUM CHLORIDE CRYS ER 20 MEQ PO TBCR
40.0000 meq | EXTENDED_RELEASE_TABLET | Freq: Once | ORAL | Status: AC
  Administered 2014-12-03: 40 meq via ORAL
  Filled 2014-12-03: qty 2

## 2014-12-03 MED ORDER — ONDANSETRON HCL 4 MG PO TABS: 4.0000 mg | ORAL_TABLET | Freq: Four times a day (QID) | ORAL | Status: DC | PRN

## 2014-12-03 MED ORDER — SODIUM CHLORIDE 0.9 % IV BOLUS (SEPSIS)
1000.0000 mL | Freq: Once | INTRAVENOUS | Status: AC
  Administered 2014-12-03: 1000 mL via INTRAVENOUS

## 2014-12-03 MED ORDER — POTASSIUM CHLORIDE CRYS ER 20 MEQ PO TBCR: 40.0000 meq | EXTENDED_RELEASE_TABLET | Freq: Two times a day (BID) | ORAL | Status: DC

## 2014-12-03 NOTE — ED Provider Notes (Signed)
CSN: [id number]     Arrival date & time 12/03/14  1625 History   None    Chief Complaint  Patient presents with  . Emesis  . Diarrhea     (Consider location/radiation/quality/duration/timing/severity/associated sxs/prior Treatment) HPI Comments: 79 yo F hx of COPD, HTN, HLD, prediabetes, osteoporosis, presents with CC emesis, diarrhea.  Symptoms started 24-36 hours ago.  C/o three episodes of nonbilious, nonbloody emesis, and nonbloody diarrhea.  Associated fatigue 2/2 to this, and pt endorses "I feel dehydrated".  Pt denies fever, chills, CP, SOB, cough, abdominal pain, dysuria, vaginal symptoms, rash, myalgias, or any other symptoms.  Of note pt with recent excision of rectal polyp on 2/8, and recent admission to hospital on 1/25 for COPD exacerbation.  Denies any other sick contacts.   The history is provided by the patient, the spouse and a relative. No language interpreter was used.    Past Medical History  Diagnosis Date  . Incontinence     not indicated at this visit.  . Wears dentures   . Osteoporosis   . Bulging disc   . Hyperlipidemia   . Chronic bronchitis     due to congestion at times, on prednisone and advair  . GERD (gastroesophageal reflux disease)   . Neuropathy     legs stay numb   . Depression     many years ago  . Hypertension     followed by intern med Dr. Marisue Brooklyn [phone]  . Tubulovillous adenoma of rectum   . Balance disorder 2008.      Falls a lot.  She can be standing and then leans too far over to one side   . Wears glasses   . Hearing loss     mild  . Iatrogenic adrenal insufficiency   . Osteoarthritis     hands,   . COPD (chronic obstructive pulmonary disease)   . Shortness of breath dyspnea    Past Surgical History  Procedure Laterality Date  . Incontinence surgery      multiple procedures, not cured  . Rectal surgery      by dr. Marlou Starks, removal of polyp  . Appendectomy    . Abdominal hysterectomy    . Tonsillectomy    . Eye surgery       bilateral cataract surgery and lens implant  . Dilation and curettage of uterus    . Rectal biopsy  09/21/2011    Procedure: BIOPSY RECTAL;  Surgeon: Merrie Roof, MD;  Location: [facility];  Service: General;  Laterality: N/A;  3-4 cm  . Finger surgery      fusions and debridements for OA  . External fixation leg  10/25/2012    Procedure: EXTERNAL FIXATION LEG;  Surgeon: Rozanna Box, MD;  Location: [facility];  Service: Orthopedics;  Laterality: Right;  . Knee arthroscopy with medial menisectomy Right 09/19/2014    Procedure: RIGHT KNEE ARTHROSCOPY WITH MEDIAL AND LATERAL MENISECTOMIES. CHONDROPLASTY OF PATELLA-FEMORAL JOINT;  Surgeon: Alta Corning, MD;  Location: [facility];  Service: Orthopedics;  Laterality: Right;  . Knee arthroscopy with lateral menisectomy Right 09/19/2014    Procedure: KNEE ARTHROSCOPY WITH LATERAL MENISECTOMY;  Surgeon: Alta Corning, MD;  Location: [facility];  Service: Orthopedics;  Laterality: Right;  . Chondroplasty  09/19/2014    Procedure: CHONDROPLASTY;  Surgeon: Alta Corning, MD;  Location: [facility];  Service: Orthopedics;;   Family History  Problem Relation Age of Onset  . Cancer  Sister     pt unaware of what kind  . High blood pressure Mother   . COPD Mother   . Heart attack Father    History  Substance Use Topics  . Smoking status: Never Smoker   . Smokeless tobacco: Never Used  . Alcohol Use: No   OB History    No data available     Review of Systems  Constitutional: Positive for fatigue. Negative for fever and chills.  Respiratory: Negative for cough and shortness of breath.   Cardiovascular: Negative for chest pain and palpitations.  Gastrointestinal: Positive for nausea, vomiting and diarrhea. Negative for abdominal pain and constipation.  Genitourinary: Negative for dysuria.  Musculoskeletal: Negative for myalgias.  Skin: Negative for rash.  Neurological: Negative for dizziness, weakness,  light-headedness, numbness and headaches.  Hematological: Negative for adenopathy. Does not bruise/bleed easily.  All other systems reviewed and are negative.     Allergies  Codeine; Demerol; Morphine and related; Other; and Sulfate  Home Medications   Prior to Admission medications   Medication Sig Start Date End Date Taking? Authorizing Provider  acetaminophen (TYLENOL) 325 MG tablet Take 1-2 tablets (325-650 mg total) by mouth every 6 (six) hours as needed. 10/27/12   Jari Pigg, PA-C  ADVAIR DISKUS 250-50 MCG/DOSE AEPB INHALE ONE PUFF BY MOUTH TWICE DAILY 11/07/13   Melissa R Smith, PA-C  albuterol (PROVENTIL) (2.5 MG/3ML) 0.083% nebulizer solution Take 3 mLs (2.5 mg total) by nebulization every 6 (six) hours as needed for wheezing or shortness of breath. 11/21/14   [name], MD  amitriptyline (ELAVIL) 10 MG tablet Take 1 tablet (10 mg total) by mouth 3 (three) times daily. Patient not taking: Reported on 11/20/2014 05/31/14   [name], MD  aspirin 81 MG tablet Take 81 mg by mouth daily.     Historical Provider, MD  cholecalciferol (VITAMIN D) 1000 UNITS tablet Take 5 tablets (5,000 Units total) by mouth daily. 10/28/12   Radene Gunning, NP  FLUoxetine (PROZAC) 20 MG capsule Take 1 capsule (20 mg total) by mouth daily. Patient not taking: Reported on 11/20/2014 11/16/14   Eugenie Filler, MD  furosemide (LASIX) 40 MG tablet Take 1 tablet (40 mg total) by mouth daily. 11/16/14   Eugenie Filler, MD  HYDROcodone-acetaminophen (NORCO) 5-325 MG per tablet Take 1-2 tablets by mouth every 6 (six) hours as needed for moderate pain. 09/19/14   Erlene Senters, PA-C  metoprolol succinate (TOPROL-XL) 25 MG 24 hr tablet Take 12.5 mg by mouth 2 (two) times daily.    Historical Provider, MD  Multiple Vitamin (MULTIVITAMIN) capsule Take 1 capsule by mouth daily.     Historical Provider, MD  nystatin (MYCOSTATIN) 100000 UNIT/ML suspension Swish and spit 5 ml QID for two days after symptoms  resolve. 09/10/14   [name], MD  omeprazole (PRILOSEC) 40 MG capsule TAKE ONE CAPSULE BY MOUTH TWICE DAILY FOR ACID REFLUX 06/01/14   [name], MD  potassium chloride SA (K-DUR,KLOR-CON) 20 MEQ tablet Take 20 mEq by mouth 2 (two) times daily.    Historical Provider, MD  pravastatin (PRAVACHOL) 40 MG tablet TAKE ONE TABLET BY MOUTH ONCE DAILY FOR CHOLESTEROL 06/01/14   [name], MD  predniSONE (DELTASONE) 20 MG tablet Take 3 tablets (60 mg total) by mouth daily before breakfast. Take 3 tablets (60mg ) daily x 2 days, then 2 tablets (40mg ) daily x 2 days, then 1 tablet (20mg ) daily x 2 days then stop. 11/16/14   Irine Seal  V, MD  traMADol (ULTRAM) 50 MG tablet Take 1-2 tablets (50-100 mg total) by mouth every 6 (six) hours as needed for moderate pain. Patient not taking: Reported on 11/20/2014 09/19/14   Erlene Senters, PA-C   BP 102/54 mmHg  Temp(Src) 98.8 F (37.1 C) (Oral)  Resp 22  Ht 5\' 4"  (1.626 m)  Wt 170 lb (77.111 kg)  BMI 29.17 kg/m2  SpO2 92% Physical Exam  Constitutional: She is oriented to person, place, and time. She appears well-developed and well-nourished.  HENT:  Head: Normocephalic and atraumatic.  Right Ear: External ear normal.  Left Ear: External ear normal.  Mouth/Throat: Oropharynx is clear and moist.  Eyes: Conjunctivae and EOM are normal. Pupils are equal, round, and reactive to light.  Neck: Normal range of motion. Neck supple.  Cardiovascular: Normal rate, regular rhythm, normal heart sounds and intact distal pulses.   Pulmonary/Chest: Effort normal and breath sounds normal. No respiratory distress. She has no wheezes. She has no rales. She exhibits no tenderness.  Abdominal: Soft. Bowel sounds are normal. She exhibits no distension and no mass. There is no tenderness. There is no rebound and no guarding.  Soft, nontender.  Musculoskeletal: Normal range of motion.  Neurological: She is alert and oriented to person, place, and time.  Skin:  Skin is warm and dry.  Nursing note and vitals reviewed.   ED Course  Procedures (including critical care time) Labs Review Labs Reviewed  CBC WITH DIFFERENTIAL/PLATELET  COMPREHENSIVE METABOLIC PANEL  LIPASE, BLOOD  URINALYSIS, ROUTINE W REFLEX MICROSCOPIC  I-STAT TROPOININ, ED    Imaging Review No results found.   EKG Interpretation None      MDM   Final diagnoses:  Gastroenteritis  Dehydration   79 yo F hx of COPD, HTN, HLD, prediabetes, osteoporosis, presents with CC emesis, diarrhea.  Symptoms started 24-36 hours ago.   Physical exam as above.  VS WNL, afebrile.  Labs demonstrate hemoconcentration, Hgb 16.5; hypokalemia to 2.8 (which was repleted with oral KCl in ED), mild elevation Cr 1.41.  UA with small leuks, negative nitrites, few epithelials.  Pt without dysuria, thus will not treat for UTI at this time.    Pt given 2L NS bolus.  On reeval pt feels well, no nausea, vomiting, or diarrhea in ED.  Wants to go home.   Diagnosis dehydration, gastroenteritis.   Pt okay for d/c home at this time.  Rx Zofran given.  Advise supportive care, hydration at home.  F/u with PCP in 1 week.  Return precautions given.  Pt understands and agrees with plan.    Sinda Du  I discussed pt with my attending Dr. [name].     Sinda Du, MD 12/04/14 0814  Carmin Muskrat, MD 12/05/14 [phone]

## 2014-12-03 NOTE — ED Notes (Signed)
Pt arrives via EMS from home c/o N/V/D x 24 hrs.

## 2014-12-03 NOTE — Discharge Instructions (Signed)
Dehydration, Adult Dehydration means your body does not have as much fluid as it needs. Your kidneys, brain, and heart will not work properly without the right amount of fluids and salt.  HOME CARE  Ask your doctor how to replace body fluid losses (rehydrate).  Drink enough fluids to keep your pee (urine) clear or pale yellow.  Drink small amounts of fluids often if you feel sick to your stomach (nauseous) or throw up (vomit).  Eat like you normally do.  Avoid:  Foods or drinks high in sugar.  Bubbly (carbonated) drinks.  Juice.  Very hot or cold fluids.  Drinks with caffeine.  Fatty, greasy foods.  Alcohol.  Tobacco.  Eating too much.  Gelatin desserts.  Wash your hands to avoid spreading germs (bacteria, viruses).  Only take medicine as told by your doctor.  Keep all doctor visits as told. GET HELP RIGHT AWAY IF:   You cannot drink something without throwing up.  You get worse even with treatment.  Your vomit has blood in it or looks greenish.  Your poop (stool) has blood in it or looks black and tarry.  You have not peed in 6 to 8 hours.  You pee a small amount of very dark pee.  You have a fever.  You pass out (faint).  You have belly (abdominal) pain that gets worse or stays in one spot (localizes).  You have a rash, stiff neck, or bad headache.  You get easily annoyed, sleepy, or are hard to wake up.  You feel weak, dizzy, or very thirsty. MAKE SURE YOU:   Understand these instructions.  Will watch your condition.  Will get help right away if you are not doing well or get worse. Document Released: 08/01/2009 Document Revised: 12/28/2011 Document Reviewed: 05/25/2011 [facility] Patient Information 2015 [place], [place]. This information is not intended to replace advice given to you by your health care provider. Make sure you discuss any questions you have with your health care provider.  Viral Gastroenteritis Viral gastroenteritis is also  known as stomach flu. This condition affects the stomach and intestinal tract. It can cause sudden diarrhea and vomiting. The illness typically lasts 3 to 8 days. Most people develop an immune response that eventually gets rid of the virus. While this natural response develops, the virus can make you quite ill. CAUSES  Many different viruses can cause gastroenteritis, such as rotavirus or noroviruses. You can catch one of these viruses by consuming contaminated food or water. You may also catch a virus by sharing utensils or other personal items with an infected person or by touching a contaminated surface. SYMPTOMS  The most common symptoms are diarrhea and vomiting. These problems can cause a severe loss of body fluids (dehydration) and a body salt (electrolyte) imbalance. Other symptoms may include:  Fever.  Headache.  Fatigue.  Abdominal pain. DIAGNOSIS  Your caregiver can usually diagnose viral gastroenteritis based on your symptoms and a physical exam. A stool sample may also be taken to test for the presence of viruses or other infections. TREATMENT  This illness typically goes away on its own. Treatments are aimed at rehydration. The most serious cases of viral gastroenteritis involve vomiting so severely that you are not able to keep fluids down. In these cases, fluids must be given through an intravenous line (IV). HOME CARE INSTRUCTIONS   Drink enough fluids to keep your urine clear or pale yellow. Drink small amounts of fluids frequently and increase the amounts as tolerated.  Ask your caregiver for specific rehydration instructions.  Avoid:  Foods high in sugar.  Alcohol.  Carbonated drinks.  Tobacco.  Juice.  Caffeine drinks.  Extremely hot or cold fluids.  Fatty, greasy foods.  Too much intake of anything at one time.  Dairy products until 24 to 48 hours after diarrhea stops.  You may consume probiotics. Probiotics are active cultures of beneficial  bacteria. They may lessen the amount and number of diarrheal stools in adults. Probiotics can be found in yogurt with active cultures and in supplements.  Wash your hands well to avoid spreading the virus.  Only take over-the-counter or prescription medicines for pain, discomfort, or fever as directed by your caregiver. Do not give aspirin to children. Antidiarrheal medicines are not recommended.  Ask your caregiver if you should continue to take your regular prescribed and over-the-counter medicines.  Keep all follow-up appointments as directed by your caregiver. SEEK IMMEDIATE MEDICAL CARE IF:   You are unable to keep fluids down.  You do not urinate at least once every 6 to 8 hours.  You develop shortness of breath.  You notice blood in your stool or vomit. This may look like coffee grounds.  You have abdominal pain that increases or is concentrated in one small area (localized).  You have persistent vomiting or diarrhea.  You have a fever.  The patient is a child younger than 3 months, and he or she has a fever.  The patient is a child older than 3 months, and he or she has a fever and persistent symptoms.  The patient is a child older than 3 months, and he or she has a fever and symptoms suddenly get worse.  The patient is a baby, and he or she has no tears when crying. MAKE SURE YOU:   Understand these instructions.  Will watch your condition.  Will get help right away if you are not doing well or get worse. Document Released: 10/05/2005 Document Revised: 12/28/2011 Document Reviewed: 07/22/2011 [facility] Patient Information 2015 [place], [place]. This information is not intended to replace advice given to you by your health care provider. Make sure you discuss any questions you have with your health care provider.

## 2014-12-05 ENCOUNTER — Ambulatory Visit (HOSPITAL_COMMUNITY): Payer: Medicare Other | Attending: Internal Medicine | Admitting: Physical Therapy

## 2014-12-06 ENCOUNTER — Other Ambulatory Visit: Payer: Self-pay | Admitting: *Deleted

## 2014-12-06 MED ORDER — OXYBUTYNIN CHLORIDE 5 MG PO TABS: 5.0000 mg | ORAL_TABLET | Freq: Every day | ORAL | Status: DC

## 2014-12-07 ENCOUNTER — Ambulatory Visit: Payer: Self-pay | Admitting: Physician Assistant

## 2014-12-10 ENCOUNTER — Other Ambulatory Visit: Payer: Self-pay | Admitting: *Deleted

## 2014-12-10 MED ORDER — OXYBUTYNIN CHLORIDE 5 MG PO TABS: 5.0000 mg | ORAL_TABLET | Freq: Every day | ORAL | Status: DC

## 2014-12-13 ENCOUNTER — Encounter: Payer: Self-pay | Admitting: Physician Assistant

## 2014-12-13 ENCOUNTER — Other Ambulatory Visit: Payer: Self-pay

## 2014-12-13 ENCOUNTER — Ambulatory Visit (INDEPENDENT_AMBULATORY_CARE_PROVIDER_SITE_OTHER): Payer: Medicare Other | Admitting: Physician Assistant

## 2014-12-13 VITALS — BP 118/62 | HR 76 | Temp 97.9°F | Resp 16 | Ht 64.0 in | Wt 170.0 lb

## 2014-12-13 DIAGNOSIS — E876 Hypokalemia: Secondary | ICD-10-CM | POA: Diagnosis not present

## 2014-12-13 DIAGNOSIS — I10 Essential (primary) hypertension: Secondary | ICD-10-CM | POA: Diagnosis not present

## 2014-12-13 DIAGNOSIS — E785 Hyperlipidemia, unspecified: Secondary | ICD-10-CM

## 2014-12-13 DIAGNOSIS — Z79899 Other long term (current) drug therapy: Secondary | ICD-10-CM | POA: Diagnosis not present

## 2014-12-13 DIAGNOSIS — E860 Dehydration: Secondary | ICD-10-CM

## 2014-12-13 DIAGNOSIS — J449 Chronic obstructive pulmonary disease, unspecified: Secondary | ICD-10-CM

## 2014-12-13 DIAGNOSIS — R7303 Prediabetes: Secondary | ICD-10-CM

## 2014-12-13 DIAGNOSIS — R7309 Other abnormal glucose: Secondary | ICD-10-CM | POA: Diagnosis not present

## 2014-12-13 DIAGNOSIS — E559 Vitamin D deficiency, unspecified: Secondary | ICD-10-CM

## 2014-12-13 LAB — CBC WITH DIFFERENTIAL/PLATELET
BASOS PCT: 1 % (ref 0–1)
Basophils Absolute: 0.1 10*3/uL (ref 0.0–0.1)
Eosinophils Absolute: 0.1 10*3/uL (ref 0.0–0.7)
Eosinophils Relative: 1 % (ref 0–5)
HCT: 45.5 % (ref 36.0–46.0)
Hemoglobin: 15.3 g/dL — ABNORMAL HIGH (ref 12.0–15.0)
LYMPHS ABS: 1.8 10*3/uL (ref 0.7–4.0)
LYMPHS PCT: 24 % (ref 12–46)
MCH: 31.1 pg (ref 26.0–34.0)
MCHC: 33.6 g/dL (ref 30.0–36.0)
MCV: 92.5 fL (ref 78.0–100.0)
MONO ABS: 0.5 10*3/uL (ref 0.1–1.0)
MPV: 10.6 fL (ref 8.6–12.4)
Monocytes Relative: 7 % (ref 3–12)
Neutro Abs: 5 10*3/uL (ref 1.7–7.7)
Neutrophils Relative %: 67 % (ref 43–77)
PLATELETS: 300 10*3/uL (ref 150–400)
RBC: 4.92 MIL/uL (ref 3.87–5.11)
RDW: 14.1 % (ref 11.5–15.5)
WBC: 7.5 10*3/uL (ref 4.0–10.5)

## 2014-12-13 LAB — LIPID PANEL
CHOL/HDL RATIO: 2.7 ratio
CHOLESTEROL: 188 mg/dL (ref 0–200)
HDL: 69 mg/dL (ref >=46)
LDL CALC: 87 mg/dL (ref 0–99)
TRIGLYCERIDES: 162 mg/dL — AB (ref <150)
VLDL: 32 mg/dL (ref 0–40)

## 2014-12-13 LAB — TSH: TSH: 1.076 u[IU]/mL (ref 0.350–4.500)

## 2014-12-13 LAB — BASIC METABOLIC PANEL WITH GFR
BUN: 27 mg/dL — ABNORMAL HIGH (ref 6–23)
CALCIUM: 9.5 mg/dL (ref 8.4–10.5)
CO2: 32 meq/L (ref 19–32)
CREATININE: 1.4 mg/dL — AB (ref 0.50–1.10)
Chloride: 98 mEq/L (ref 96–112)
GFR, EST AFRICAN AMERICAN: 42 mL/min — AB
GFR, Est Non African American: 37 mL/min — ABNORMAL LOW
GLUCOSE: 134 mg/dL — AB (ref 70–99)
Potassium: 4.2 mEq/L (ref 3.5–5.3)
SODIUM: 142 meq/L (ref 135–145)

## 2014-12-13 LAB — HEPATIC FUNCTION PANEL
ALT: 17 U/L (ref 0–35)
AST: 19 U/L (ref 0–37)
Albumin: 4.1 g/dL (ref 3.5–5.2)
Alkaline Phosphatase: 47 U/L (ref 39–117)
BILIRUBIN TOTAL: 0.7 mg/dL (ref 0.2–1.2)
Bilirubin, Direct: 0.1 mg/dL (ref 0.0–0.3)
Indirect Bilirubin: 0.6 mg/dL (ref 0.2–1.2)
Total Protein: 6.3 g/dL (ref 6.0–8.3)

## 2014-12-13 LAB — MAGNESIUM: MAGNESIUM: 2.1 mg/dL (ref 1.5–2.5)

## 2014-12-13 MED ORDER — POTASSIUM CHLORIDE CRYS ER 20 MEQ PO TBCR: 20.0000 meq | EXTENDED_RELEASE_TABLET | Freq: Two times a day (BID) | ORAL | Status: DC

## 2014-12-13 MED ORDER — VALSARTAN 160 MG PO TABS: 160.0000 mg | ORAL_TABLET | Freq: Every day | ORAL | Status: DC

## 2014-12-13 NOTE — Progress Notes (Signed)
Assessment and Plan:  Hypertension: Continue medication but will stop losartan and HCTZ switch to diovan, monitor blood pressure at home. Continue DASH diet.  Reminder to go to the ER if any CP, SOB, nausea, dizziness, severe HA, changes vision/speech, left arm numbness and tingling, and jaw pain. Cholesterol: Continue diet and exercise. Check cholesterol.  Pre-diabetes-Continue diet and exercise. Check A1C Vitamin D Def- continue medications.  COPD- start doing duoneb BID with advair Fatigue- ? From copd, hypotension- will stop HCTZ, continue lasix, check potassium.  Cough- will stop losartan and HCTZ and switch to diovan 160 without HCTZ, check labs.   Continue diet and meds as discussed. Further disposition pending results of labs.  HPI 79 y.o. female  hx of COPD, HTN, HLD, prediabetes, osteoporosis, presents for 3 month follow up with hypertension,, hyperlipidemia, prediabetes, vitamin D and ER follow up. Marland Kitchen  Her blood pressure has been controlled at home, denies dizziness, today their BP is BP: 118/62 mmHg  She does not workout. She denies chest pain, shortness of breath, dizziness.  She is on cholesterol medication, pravastatin 40mg  1/2 pill daily Her cholesterol is at goal. The cholesterol last visit was:   Lab Results  Component Value Date   CHOL 139 08/23/2014   HDL 69 08/23/2014   LDLCALC 41 08/23/2014   TRIG 145 08/23/2014   CHOLHDL 2.0 08/23/2014    She has been working on diet and exercise for prediabetes, and denies paresthesia of the feet, polydipsia and polyuria. Last A1C in the office was:  Lab Results  Component Value Date   HGBA1C 5.7* 08/23/2014   Patient is on Vitamin D supplement.   Lab Results  Component Value Date   VD25OH 52 08/23/2014     Uses her walker all of the time.  Went to the ER on 02/15 for diarrhea and vomiting, had weakness. She had severe hypokalemia and dehydration, she was given NS bolus, oral potassium, zofran, and sent home. Since that time  she does not have any more vomiting, diarrhea but feels weak. She is on lasix 40mg  and HCTZ She was also in the ER recently for a COPD exacerbation, she is on advair, does not do duoneb regularly. She is still on prednisone and on her GERd med and on losartan.  While in the office she started to cough so much that her O2 dropped to 88 and she could not talk, given [place] and felt better-? Mucus plug?.   Current Medications:  Current Outpatient Prescriptions on File Prior to Visit  Medication Sig Dispense Refill  . acetaminophen (TYLENOL) 325 MG tablet Take 1-2 tablets (325-650 mg total) by mouth every 6 (six) hours as needed. 90 tablet 0  . ADVAIR DISKUS 250-50 MCG/DOSE AEPB INHALE ONE PUFF BY MOUTH TWICE DAILY 60 each 6  . albuterol (PROVENTIL) (2.5 MG/3ML) 0.083% nebulizer solution Take 3 mLs (2.5 mg total) by nebulization every 6 (six) hours as needed for wheezing or shortness of breath. 75 mL 4  . aspirin 81 MG tablet Take 81 mg by mouth daily.     . cholecalciferol (VITAMIN D) 1000 UNITS tablet Take 5 tablets (5,000 Units total) by mouth daily.    Marland Kitchen FLUoxetine (PROZAC) 20 MG capsule Take 1 capsule (20 mg total) by mouth daily. 30 capsule 0  . furosemide (LASIX) 40 MG tablet Take 1 tablet (40 mg total) by mouth daily. 30 tablet 0  . HYDROcodone-acetaminophen (NORCO) 5-325 MG per tablet Take 1-2 tablets by mouth every 6 (six) hours as  needed for moderate pain. 40 tablet 0  . losartan-hydrochlorothiazide (HYZAAR) 100-25 MG per tablet Take 1 tablet by mouth daily.    . Multiple Vitamin (MULTIVITAMIN) capsule Take 1 capsule by mouth daily.     Marland Kitchen nystatin (MYCOSTATIN) 100000 UNIT/ML suspension Swish and spit 5 ml QID for two days after symptoms resolve. 240 mL 3  . omeprazole (PRILOSEC) 40 MG capsule TAKE ONE CAPSULE BY MOUTH TWICE DAILY FOR ACID REFLUX 180 capsule 11  . ondansetron (ZOFRAN) 4 MG tablet Take 1 tablet (4 mg total) by mouth every 6 (six) hours as needed for nausea or vomiting. 28  tablet 0  . oxybutynin (DITROPAN) 5 MG tablet Take 1 tablet (5 mg total) by mouth daily. 90 tablet 4  . pravastatin (PRAVACHOL) 40 MG tablet TAKE ONE TABLET BY MOUTH ONCE DAILY FOR CHOLESTEROL 90 tablet 11  . predniSONE (DELTASONE) 20 MG tablet Take 3 tablets (60 mg total) by mouth daily before breakfast. Take 3 tablets (60mg ) daily x 2 days, then 2 tablets (40mg ) daily x 2 days, then 1 tablet (20mg ) daily x 2 days then stop. 12 tablet 0   No current facility-administered medications on file prior to visit.   Medical History:  Past Medical History  Diagnosis Date  . Incontinence     not indicated at this visit.  . Wears dentures   . Osteoporosis   . Bulging disc   . Hyperlipidemia   . Chronic bronchitis     due to congestion at times, on prednisone and advair  . GERD (gastroesophageal reflux disease)   . Neuropathy     legs stay numb   . Depression     many years ago  . Hypertension     followed by intern med Dr. Marisue Brooklyn [phone]  . Tubulovillous adenoma of rectum   . Balance disorder 2008.      Falls a lot.  She can be standing and then leans too far over to one side   . Wears glasses   . Hearing loss     mild  . Iatrogenic adrenal insufficiency   . Osteoarthritis     hands,   . COPD (chronic obstructive pulmonary disease)   . Shortness of breath dyspnea    Allergies:  Allergies  Allergen Reactions  . Codeine Other (See Comments)    hallucinations  . Demerol Other (See Comments)    hallucinations  . Morphine And Related Other (See Comments)    unknown  . Other Other (See Comments)    Invan 7- pt became red all over  . Sulfate Other (See Comments)    unknown     Review of Systems:  Review of Systems  Constitutional: Positive for malaise/fatigue. Negative for fever, chills, weight loss and diaphoresis.  Eyes: Negative.   Respiratory: Positive for cough, shortness of breath and wheezing. Negative for hemoptysis and sputum production.   Cardiovascular: Negative.    Gastrointestinal: Positive for heartburn. Negative for nausea, vomiting, abdominal pain, diarrhea, constipation, blood in stool and melena.  Genitourinary: Negative.  Negative for dysuria.  Musculoskeletal: Positive for myalgias, back pain, joint pain and neck pain. Negative for falls (high risk falls).  Skin: Negative.   Neurological: Positive for tingling. Negative for dizziness, tremors, sensory change, speech change, focal weakness, seizures, loss of consciousness and weakness.  Psychiatric/Behavioral: Positive for memory loss. Negative for depression, suicidal ideas, hallucinations and substance abuse. The patient is nervous/anxious. The patient does not have insomnia.     Family history- Review  and unchanged Social history- Review and unchanged Physical Exam: BP 118/62 mmHg  Pulse 76  Temp(Src) 97.9 F (36.6 C)  Resp 16  Ht 5\' 4"  (1.626 m)  Wt 170 lb (77.111 kg)  BMI 29.17 kg/m2 Wt Readings from Last 3 Encounters:  12/13/14 170 lb (77.111 kg)  12/03/14 170 lb (77.111 kg)  11/20/14 168 lb 9.6 oz (76.476 kg)   General Appearance: Well nourished, in no apparent distress. Eyes: PERRLA, EOMs, conjunctiva no swelling or erythema Sinuses: No Frontal/maxillary tenderness ENT/Mouth: Ext aud canals clear, TMs without erythema, bulging. No erythema, swelling, or exudate on post pharynx.  Tonsils not swollen or erythematous. Hearing decreased Neck: Supple, thyroid normal.  Respiratory: Respiratory effort normal, = diffuse wheezing and decreased breath sounds without rales, rhonchi, or stridor. 96% RA, will get to 88% with coughing.  Cardio: RRR with holosystolic murmur.  Without edema.  Abdomen: Soft, + BS, obese, distented, Non tender, no guarding, rebound, hernias, masses. Lymphatics: Non tender without lymphadenopathy.  Musculoskeletal: Full ROM, 4/5 strength, Ambulates with walker  Skin: Warm, dry without rashes, lesions, ecchymosis.  Neuro: Cranial nerves intact. Normal muscle  tone, no cerebellar symptoms. Psych: Awake and oriented X 3, normal affect, Insight and Judgment appropriate.    Vicie Mutters, PA-C 12:06 PM [facility]

## 2014-12-13 NOTE — Patient Instructions (Addendum)
I want you to continue the Advair and do the [place] no matter what Stay on mucinex daily Stop the losartan with HCTZ and start on diovan 160mg  daily start on 1/2 pill, if BP is greater than 130/80 then go up to a whole pill.  Stop the oxybutynin for 2 weeks and see if it helps the swallowing/cough  Potassium Content of Foods  The body needs potassium to control blood pressure and to keep the muscles and nervous system healthy. Here are some healthy foods below that are high in potassium. Also you can get the white label salt of "NO SALT" salt substitute, 1/4 teaspoon of this is equivalent to 86meq potassium.   FOODS AND DRINKS HIGH IN POTASSIUM FOODS MODERATE IN POTASSIUM   Fruits  Avocado (cubed),  c / 50 g.  Banana (sliced), 75 g.  Cantaloupe (cubed), 80 g.  Honeydew, 1 wedge / 85 g.  Kiwi (sliced), 90 g.  Nectarine, 1 small / 129 g.  Orange, 1 medium / 131 g. Vegetables  Artichoke,  of a medium / 64 g.  Asparagus (boiled), 90 g..  Broccoli (boiled), 78 g.  Brussels sprout (boiled), 78 g.  Butternut squash (baked), 103 g.  Chickpea (cooked), 82 g.  Green peas (cooked), 80 g.  Kidney beans (cooked), 5 tbsp / 55 g.  Lima beans (cooked),  c / 43 g.  Navy beans (cooked),  c / 61 g.  Spinach (cooked),  c / 45 g.  Sweet potato (baked),  c / 50 g.  Tomato (chopped or sliced), 90 g.  Vegetable juice.  White mushrooms (cooked), 78 g.  Yam (cooked or baked),  c / 34 g.  Zucchini squash (boiled), 90 g. Other Foods and Drinks  Almonds (whole),  c / 36 g.  Fish, 3 oz / 85 g.  Nonfat fruit variety yogurt, 123 g.  Pistachio nuts, 1 oz / 28 g.  Pumpkin seeds, 1 oz / 28 g.  Red meat (broiled, cooked, grilled), 3 oz / 85 g.  Scallops (steamed), 3 oz / 85 g.  Spaghetti sauce,  c / 66 g.  Sunflower seeds (dry roasted), 1 oz / 28 g.  Veggie burger, 1 patty / 70 g. Fruits  Grapefruit,  of the fruit / 803 g  Plums (sliced), 83  g.  Tangerine, 1 large / 120 g. Vegetables  Carrots (boiled), 78 g.  Carrots (sliced), 61 g.  Rhubarb (cooked with sugar), 120 g.  Rutabaga (cooked), 120 g.  Yellow snap beans (cooked), 63 g. Other Foods and Drinks   Chicken breast (roasted and chopped),  c / 70 g.  Pita bread, 1 large / 64 g.  Shrimp (steamed), 4 oz / 113 g.  Swiss cheese (diced), 70 g.

## 2014-12-14 LAB — HEMOGLOBIN A1C
Hgb A1c MFr Bld: 6.1 % — ABNORMAL HIGH (ref <5.7)
MEAN PLASMA GLUCOSE: 128 mg/dL — AB (ref <117)

## 2014-12-16 ENCOUNTER — Other Ambulatory Visit: Payer: Self-pay | Admitting: Emergency Medicine

## 2014-12-17 ENCOUNTER — Other Ambulatory Visit: Payer: Self-pay | Admitting: *Deleted

## 2014-12-17 MED ORDER — FLUTICASONE-SALMETEROL 250-50 MCG/DOSE IN AEPB: 1.0000 | INHALATION_SPRAY | Freq: Two times a day (BID) | RESPIRATORY_TRACT | Status: DC

## 2014-12-24 ENCOUNTER — Telehealth: Payer: Self-pay | Admitting: *Deleted

## 2014-12-24 ENCOUNTER — Ambulatory Visit: Payer: Self-pay | Admitting: Internal Medicine

## 2014-12-24 MED ORDER — OXYBUTYNIN CHLORIDE 5 MG PO TABS: 5.0000 mg | ORAL_TABLET | Freq: Every day | ORAL | Status: DC

## 2014-12-24 NOTE — Telephone Encounter (Signed)
Patient request a refill on Oxybutynin.  She states she stopped the RX x about 2 weeks and did not notice any improvement in cough or swallowing.  Ok to send in refill per Irving Shows.

## 2014-12-31 ENCOUNTER — Ambulatory Visit (HOSPITAL_COMMUNITY): Payer: Medicare Other | Admitting: Physical Therapy

## 2015-01-01 ENCOUNTER — Ambulatory Visit (HOSPITAL_COMMUNITY): Payer: Medicare Other | Admitting: Physical Therapy

## 2015-01-02 ENCOUNTER — Other Ambulatory Visit: Payer: Self-pay | Admitting: Internal Medicine

## 2015-01-06 ENCOUNTER — Encounter: Payer: Self-pay | Admitting: *Deleted

## 2015-01-07 ENCOUNTER — Ambulatory Visit (HOSPITAL_COMMUNITY)
Admission: RE | Admit: 2015-01-07 | Discharge: 2015-01-07 | Disposition: A | Discharge status: Home / Self Care | Payer: Medicare Other | Source: Ambulatory Visit | Attending: Internal Medicine | Admitting: Internal Medicine

## 2015-01-07 ENCOUNTER — Encounter: Payer: Self-pay | Admitting: Internal Medicine

## 2015-01-07 ENCOUNTER — Ambulatory Visit (INDEPENDENT_AMBULATORY_CARE_PROVIDER_SITE_OTHER): Payer: Medicare Other | Admitting: Internal Medicine

## 2015-01-07 ENCOUNTER — Ambulatory Visit: Payer: Medicare Other | Admitting: Internal Medicine

## 2015-01-07 VITALS — BP 106/60 | HR 54 | Temp 97.8°F | Resp 16 | Ht 64.0 in | Wt 171.0 lb

## 2015-01-07 DIAGNOSIS — M4856XD Collapsed vertebra, not elsewhere classified, lumbar region, subsequent encounter for fracture with routine healing: Secondary | ICD-10-CM | POA: Diagnosis not present

## 2015-01-07 DIAGNOSIS — R350 Frequency of micturition: Secondary | ICD-10-CM | POA: Diagnosis not present

## 2015-01-07 DIAGNOSIS — M5442 Lumbago with sciatica, left side: Secondary | ICD-10-CM

## 2015-01-07 DIAGNOSIS — M79605 Pain in left leg: Secondary | ICD-10-CM | POA: Insufficient documentation

## 2015-01-07 DIAGNOSIS — M545 Low back pain: Secondary | ICD-10-CM | POA: Insufficient documentation

## 2015-01-07 DIAGNOSIS — M5136 Other intervertebral disc degeneration, lumbar region: Secondary | ICD-10-CM | POA: Insufficient documentation

## 2015-01-07 DIAGNOSIS — M5430 Sciatica, unspecified side: Secondary | ICD-10-CM | POA: Diagnosis not present

## 2015-01-07 MED ORDER — VALSARTAN 160 MG PO TABS: 160.0000 mg | ORAL_TABLET | Freq: Every day | ORAL | Status: DC

## 2015-01-07 MED ORDER — HYDROCODONE-ACETAMINOPHEN 5-325 MG PO TABS: 1.0000 | ORAL_TABLET | Freq: Four times a day (QID) | ORAL | Status: DC | PRN

## 2015-01-07 NOTE — Progress Notes (Signed)
Subjective:    Patient ID: Marie [name], female    DOB: 1936/07/11, 79 y.o.   MRN: [id number]  Back Pain Pertinent negatives include no abdominal pain, chest pain, dysuria, fever or numbness.  Patient reports that she has been having lower back pain that shoots down her left leg down to the bottom of her left foot.  She reports the pain in her leg is a shooting sensation.  She reports that her back pain is more of an achiness and it is "so bad".  She has been rubbing ultra blue on it at home and it helps for a short period of time.  She is not taking any other medications.  She reports that it is a 10/10.  She reports no bowel incontience.  She reports no saddle anesthesia.  She reports no history of back pain.  She reports no injury.  She has had no surgeries on her back.  She does have a history of osteoporosis.  She has no history of cancer.  She is pain free with sitting.      Review of Systems  Constitutional: Negative for fever, chills and fatigue.  Respiratory: Negative for chest tightness and shortness of breath.   Cardiovascular: Negative for chest pain.  Gastrointestinal: Negative for nausea, vomiting and abdominal pain.  Genitourinary: Negative for dysuria, urgency and frequency.  Musculoskeletal: Positive for back pain.  Neurological: Negative for numbness.       Objective:   Physical Exam  Constitutional: She is oriented to person, place, and time. She appears well-developed and well-nourished. No distress.  HENT:  Head: Normocephalic and atraumatic.  Mouth/Throat: Oropharynx is clear and moist. No oropharyngeal exudate.  Eyes: Conjunctivae and EOM are normal. Pupils are equal, round, and reactive to light. No scleral icterus.  Neck: Normal range of motion. Neck supple. No JVD present. No thyromegaly present.  Cardiovascular: Regular rhythm, normal heart sounds and intact distal pulses.  Bradycardia present.  Exam reveals no gallop and no friction rub.   No murmur  heard. Pulmonary/Chest: Effort normal and breath sounds normal. No respiratory distress. She has no wheezes. She has no rales. She exhibits no tenderness.  Abdominal: Soft. Bowel sounds are normal. She exhibits no distension and no mass. There is no tenderness. There is no rebound and no guarding.  Musculoskeletal:  Patient rises slowly from sitting to standing.  They walk with an antalgic gait and use a cane in their right hand.  There is no evidence of erythema, ecchymosis, or gross deformity.  There is tenderness to palpation over left sciatic notch and tenderness over the left buttock.  There is minimal to no tenderness to palpation of the bony lumbar spine.  Active ROM is limited due to pain and deconditioning.  Sensation to light touch is intact over all extremities.  Strength is symmetric and equal in all extremities.     Lymphadenopathy:    She has no cervical adenopathy.  Neurological: She is alert and oriented to person, place, and time. She has normal strength. No cranial nerve deficit or sensory deficit. Coordination normal.  Skin: Skin is warm and dry. She is not diaphoretic.  Psychiatric: She has a normal mood and affect. Her behavior is normal. Judgment and thought content normal.  Nursing note and vitals reviewed.  Filed Vitals:   01/07/15 1100  BP: 106/60  Pulse: 54  Temp: 97.8 F (36.6 C)  Resp: 16          Assessment & Plan:  1. Midline low back pain with left-sided sciatica  Patient presents with low back pain with sciatica like symptoms of the left leg.  History and physical without red flags for cauda equina at this time.  Given history of osteoporosis and compression fracture will go ahead and get lumbar xray.  Will check urine for infection but doubt infection.  Will do a 6 day prednisone taper from 60 mg down by 10 mg each day until back to 10 mg daily.  Norco #30 given to take only as needed for severe pain as patient cannot have tramadol with elavil given  lowering of seizure threshold.  Patient has appointment in two weeks with Estill Bamberg.  If no better may need MRI and referral to ortho vs. Therapy.    - Urinalysis, Routine w reflex microscopic - Culture, Urine - DG Lumbar Spine Complete; Future

## 2015-01-07 NOTE — Patient Instructions (Signed)
How to take the prednisone  Day 1 take 6 pills Day 2 take 5 pills Day 3 take 4 pills Day 4 take 3 pills Day 5 take 2 pills Day 6 take 1 pill  GO TO [place]!!!!  Sciatica Sciatica is pain, weakness, numbness, or tingling along the path of the sciatic nerve. The nerve starts in the lower back and runs down the back of each leg. The nerve controls the muscles in the lower leg and in the back of the knee, while also providing sensation to the back of the thigh, lower leg, and the sole of your foot. Sciatica is a symptom of another medical condition. For instance, nerve damage or certain conditions, such as a herniated disk or bone spur on the spine, pinch or put pressure on the sciatic nerve. This causes the pain, weakness, or other sensations normally associated with sciatica. Generally, sciatica only affects one side of the body. CAUSES   Herniated or slipped disc.  Degenerative disk disease.  A pain disorder involving the narrow muscle in the buttocks (piriformis syndrome).  Pelvic injury or fracture.  Pregnancy.  Tumor (rare). SYMPTOMS  Symptoms can vary from mild to very severe. The symptoms usually travel from the low back to the buttocks and down the back of the leg. Symptoms can include:  Mild tingling or dull aches in the lower back, leg, or hip.  Numbness in the back of the calf or sole of the foot.  Burning sensations in the lower back, leg, or hip.  Sharp pains in the lower back, leg, or hip.  Leg weakness.  Severe back pain inhibiting movement. These symptoms may get worse with coughing, sneezing, laughing, or prolonged sitting or standing. Also, being overweight may worsen symptoms. DIAGNOSIS  Your caregiver will perform a physical exam to look for common symptoms of sciatica. He or she may ask you to do certain movements or activities that would trigger sciatic nerve pain. Other tests may be performed to find the cause of the  sciatica. These may include:  Blood tests.  X-rays.  Imaging tests, such as an MRI or CT scan. TREATMENT  Treatment is directed at the cause of the sciatic pain. Sometimes, treatment is not necessary and the pain and discomfort goes away on its own. If treatment is needed, your caregiver may suggest:  Over-the-counter medicines to relieve pain.  Prescription medicines, such as anti-inflammatory medicine, muscle relaxants, or narcotics.  Applying heat or ice to the painful area.  Steroid injections to lessen pain, irritation, and inflammation around the nerve.  Reducing activity during periods of pain.  Exercising and stretching to strengthen your abdomen and improve flexibility of your spine. Your caregiver may suggest losing weight if the extra weight makes the back pain worse.  Physical therapy.  Surgery to eliminate what is pressing or pinching the nerve, such as a bone spur or part of a herniated disk. HOME CARE INSTRUCTIONS   Only take over-the-counter or prescription medicines for pain or discomfort as directed by your caregiver.  Apply ice to the affected area for 20 minutes, 3-4 times a day for the first 48-72 hours. Then try heat in the same way.  Exercise, stretch, or perform your usual activities if these do not aggravate your pain.  Attend physical therapy sessions as directed by your caregiver.  Keep all follow-up appointments as directed by your caregiver.  Do not wear high heels or shoes that do not provide proper support.  Check your mattress to see if it is too soft. A firm mattress may lessen your pain and discomfort. SEEK IMMEDIATE MEDICAL CARE IF:   You lose control of your bowel or bladder (incontinence).  You have increasing weakness in the lower back, pelvis, buttocks, or legs.  You have redness or swelling of your back.  You have a burning sensation when you urinate.  You have pain that gets worse when you lie down or awakens you at  night.  Your pain is worse than you have experienced in the past.  Your pain is lasting longer than 4 weeks.  You are suddenly losing weight without reason. MAKE SURE YOU:  Understand these instructions.  Will watch your condition.  Will get help right away if you are not doing well or get worse. Document Released: 09/29/2001 Document Revised: 04/05/2012 Document Reviewed: 02/14/2012 [facility] Patient Information 2015 [place], [place]. This information is not intended to replace advice given to you by your health care provider. Make sure you discuss any questions you have with your health care provider.

## 2015-01-08 LAB — URINE CULTURE
COLONY COUNT: NO GROWTH
Organism ID, Bacteria: NO GROWTH

## 2015-01-08 LAB — URINALYSIS, ROUTINE W REFLEX MICROSCOPIC
Bilirubin Urine: NEGATIVE
Glucose, UA: NEGATIVE mg/dL
HGB URINE DIPSTICK: NEGATIVE
Ketones, ur: NEGATIVE mg/dL
NITRITE: NEGATIVE
Protein, ur: NEGATIVE mg/dL
Specific Gravity, Urine: 1.006 (ref 1.005–1.030)
UROBILINOGEN UA: 0.2 mg/dL (ref 0.0–1.0)
pH: 6.5 (ref 5.0–8.0)

## 2015-01-08 LAB — URINALYSIS, MICROSCOPIC ONLY
Bacteria, UA: NONE SEEN
CRYSTALS: NONE SEEN
Casts: NONE SEEN
SQUAMOUS EPITHELIAL / LPF: NONE SEEN

## 2015-01-16 ENCOUNTER — Ambulatory Visit (INDEPENDENT_AMBULATORY_CARE_PROVIDER_SITE_OTHER): Payer: Medicare Other | Admitting: Internal Medicine

## 2015-01-16 ENCOUNTER — Encounter: Payer: Self-pay | Admitting: Physician Assistant

## 2015-01-16 VITALS — BP 110/62 | HR 72 | Temp 97.7°F | Resp 16 | Ht 64.0 in | Wt 175.0 lb

## 2015-01-16 DIAGNOSIS — M5440 Lumbago with sciatica, unspecified side: Secondary | ICD-10-CM | POA: Diagnosis not present

## 2015-01-16 DIAGNOSIS — I10 Essential (primary) hypertension: Secondary | ICD-10-CM

## 2015-01-16 DIAGNOSIS — R350 Frequency of micturition: Secondary | ICD-10-CM

## 2015-01-16 MED ORDER — VALSARTAN 160 MG PO TABS: 160.0000 mg | ORAL_TABLET | Freq: Every day | ORAL | Status: DC

## 2015-01-16 NOTE — Patient Instructions (Signed)

## 2015-01-16 NOTE — Progress Notes (Signed)
Subjective:    Patient ID: Marie [name], female    DOB: [birth year], 79 y.o.   MRN: [id number]  HPI  Patient presents to the office for evaluation of low back pain, and blood pressure recheck.  Patient tried a steroid taper with no relief.  She states that she has not been taking her pain medication because it only hurts when she moves.  She reports that the pain is still a 10/10 when she is up and moving.  She was seen about a month ago and her blood pressure medication was changed and she was started on diovan and she reports that her blood pressure has been doing well at home.  It is usally 113/60-120/60.    Review of Systems  Constitutional: Negative for fever, chills and fatigue.  Respiratory: Negative for chest tightness and shortness of breath.   Cardiovascular: Negative for chest pain.  Gastrointestinal: Negative for abdominal pain, diarrhea and constipation.  Genitourinary: Negative for dysuria, urgency, frequency, hematuria and difficulty urinating.  Neurological: Negative for numbness.       Objective:   Physical Exam  Constitutional: She is oriented to person, place, and time. She appears well-developed and well-nourished. No distress.  HENT:  Head: Normocephalic and atraumatic.  Mouth/Throat: Oropharynx is clear and moist. No oropharyngeal exudate.  Eyes: Conjunctivae and EOM are normal. Pupils are equal, round, and reactive to light. No scleral icterus.  Neck: Normal range of motion. Neck supple. No JVD present. No thyromegaly present.  Cardiovascular: Normal rate, regular rhythm, normal heart sounds and intact distal pulses.  Exam reveals no gallop and no friction rub.   No murmur heard. Pulmonary/Chest: Effort normal and breath sounds normal. No respiratory distress. She has no wheezes. She has no rales. She exhibits no tenderness.  Abdominal: Soft. Bowel sounds are normal. She exhibits no distension and no mass. There is no tenderness. There is no rebound and no  guarding.  Musculoskeletal:  Patient rises slowly from sitting to standing.  They walk with a walker consistently.  There is no evidence of erythema, ecchymosis, or gross deformity.  There is tenderness to palpation over left buttock and left sciatic notch.  Active ROM is limited due to pain.  Pain is greatest with forward bending.  Sensation to light touch is intact over all extremities.  Strength is symmetric and equal in all extremities.    Lymphadenopathy:    She has no cervical adenopathy.  Neurological: She is alert and oriented to person, place, and time.  Skin: Skin is warm and dry. She is not diaphoretic.  Psychiatric: She has a normal mood and affect. Her behavior is normal. Judgment and thought content normal.  Nursing note and vitals reviewed.  Filed Vitals:   01/16/15 1052  BP: 110/62  Pulse: 72  Temp: 97.7 F (36.5 C)  Resp: 16           Assessment & Plan:    1. Essential hypertension -BP well controlled with diovan -patient feeling much better -Cont current therapy  2. Midline low back pain with sciatica, sciatica laterality unspecified -low back pain with left sided sciatic symptoms.  Back xray from previous shows old compression fracture and DDD.  Pain could be due to degenerative disc disease.  Patient has failed prednisone therapy.  Will get MRI and will refer to [facility] for further eval.  Patient not a great surgical candidate but could receive benefit from either PT vs. Epidural injections.   -cont norco prn for pain -  MR Lumbar Spine Wo Contrast; Future - Ambulatory referral to Orthopedic Surgery

## 2015-01-17 ENCOUNTER — Ambulatory Visit (HOSPITAL_COMMUNITY)
Admission: RE | Admit: 2015-01-17 | Discharge: 2015-01-17 | Disposition: A | Discharge status: Home / Self Care | Payer: Medicare Other | Source: Ambulatory Visit | Attending: Internal Medicine | Admitting: Internal Medicine

## 2015-01-17 DIAGNOSIS — M5440 Lumbago with sciatica, unspecified side: Secondary | ICD-10-CM | POA: Insufficient documentation

## 2015-01-17 DIAGNOSIS — M4806 Spinal stenosis, lumbar region: Secondary | ICD-10-CM | POA: Diagnosis not present

## 2015-01-21 ENCOUNTER — Encounter: Payer: Self-pay | Admitting: Internal Medicine

## 2015-01-22 ENCOUNTER — Other Ambulatory Visit: Payer: Self-pay | Admitting: Physician Assistant

## 2015-01-22 DIAGNOSIS — M810 Age-related osteoporosis without current pathological fracture: Secondary | ICD-10-CM

## 2015-01-26 DIAGNOSIS — M4806 Spinal stenosis, lumbar region: Secondary | ICD-10-CM | POA: Diagnosis not present

## 2015-01-26 DIAGNOSIS — M7138 Other bursal cyst, other site: Secondary | ICD-10-CM | POA: Diagnosis not present

## 2015-01-26 DIAGNOSIS — M4696 Unspecified inflammatory spondylopathy, lumbar region: Secondary | ICD-10-CM | POA: Diagnosis not present

## 2015-02-04 ENCOUNTER — Other Ambulatory Visit: Payer: Self-pay | Admitting: Internal Medicine

## 2015-02-07 DIAGNOSIS — Z780 Asymptomatic menopausal state: Secondary | ICD-10-CM | POA: Diagnosis not present

## 2015-02-15 ENCOUNTER — Telehealth: Payer: Self-pay | Admitting: *Deleted

## 2015-02-15 DIAGNOSIS — M5416 Radiculopathy, lumbar region: Secondary | ICD-10-CM | POA: Diagnosis not present

## 2015-02-15 DIAGNOSIS — M4806 Spinal stenosis, lumbar region: Secondary | ICD-10-CM | POA: Diagnosis not present

## 2015-02-15 DIAGNOSIS — M7138 Other bursal cyst, other site: Secondary | ICD-10-CM | POA: Diagnosis not present

## 2015-02-15 DIAGNOSIS — M7130 Other bursal cyst, unspecified site: Secondary | ICD-10-CM | POA: Diagnosis not present

## 2015-02-15 NOTE — Telephone Encounter (Signed)
Patient aware of BMD results. 

## 2015-03-01 DIAGNOSIS — M4696 Unspecified inflammatory spondylopathy, lumbar region: Secondary | ICD-10-CM | POA: Diagnosis not present

## 2015-03-01 DIAGNOSIS — M4806 Spinal stenosis, lumbar region: Secondary | ICD-10-CM | POA: Diagnosis not present

## 2015-03-01 DIAGNOSIS — M7138 Other bursal cyst, other site: Secondary | ICD-10-CM | POA: Diagnosis not present

## 2015-03-02 ENCOUNTER — Encounter: Payer: Self-pay | Admitting: *Deleted

## 2015-03-02 DIAGNOSIS — M810 Age-related osteoporosis without current pathological fracture: Secondary | ICD-10-CM

## 2015-03-07 ENCOUNTER — Ambulatory Visit: Payer: Self-pay | Admitting: Internal Medicine

## 2015-03-15 ENCOUNTER — Encounter (HOSPITAL_COMMUNITY): Payer: Self-pay | Admitting: Emergency Medicine

## 2015-03-15 ENCOUNTER — Emergency Department (HOSPITAL_COMMUNITY)
Admission: EM | Admit: 2015-03-15 | Discharge: 2015-03-15 | Disposition: A | Discharge status: Home / Self Care | Payer: Medicare Other | Attending: Emergency Medicine | Admitting: Emergency Medicine

## 2015-03-15 DIAGNOSIS — E785 Hyperlipidemia, unspecified: Secondary | ICD-10-CM | POA: Insufficient documentation

## 2015-03-15 DIAGNOSIS — J449 Chronic obstructive pulmonary disease, unspecified: Secondary | ICD-10-CM | POA: Insufficient documentation

## 2015-03-15 DIAGNOSIS — I10 Essential (primary) hypertension: Secondary | ICD-10-CM | POA: Insufficient documentation

## 2015-03-15 DIAGNOSIS — R600 Localized edema: Secondary | ICD-10-CM | POA: Diagnosis not present

## 2015-03-15 DIAGNOSIS — K219 Gastro-esophageal reflux disease without esophagitis: Secondary | ICD-10-CM | POA: Insufficient documentation

## 2015-03-15 DIAGNOSIS — Z8669 Personal history of other diseases of the nervous system and sense organs: Secondary | ICD-10-CM | POA: Insufficient documentation

## 2015-03-15 DIAGNOSIS — Z79899 Other long term (current) drug therapy: Secondary | ICD-10-CM | POA: Diagnosis not present

## 2015-03-15 DIAGNOSIS — Z7982 Long term (current) use of aspirin: Secondary | ICD-10-CM | POA: Insufficient documentation

## 2015-03-15 DIAGNOSIS — M1990 Unspecified osteoarthritis, unspecified site: Secondary | ICD-10-CM | POA: Insufficient documentation

## 2015-03-15 DIAGNOSIS — Z86018 Personal history of other benign neoplasm: Secondary | ICD-10-CM | POA: Diagnosis not present

## 2015-03-15 DIAGNOSIS — R2243 Localized swelling, mass and lump, lower limb, bilateral: Secondary | ICD-10-CM | POA: Diagnosis present

## 2015-03-15 DIAGNOSIS — F329 Major depressive disorder, single episode, unspecified: Secondary | ICD-10-CM | POA: Insufficient documentation

## 2015-03-15 LAB — CBC
HEMATOCRIT: 45.9 % (ref 36.0–46.0)
HEMOGLOBIN: 14.3 g/dL (ref 12.0–15.0)
MCH: 30.2 pg (ref 26.0–34.0)
MCHC: 31.2 g/dL (ref 30.0–36.0)
MCV: 96.8 fL (ref 78.0–100.0)
Platelets: 211 10*3/uL (ref 150–400)
RBC: 4.74 MIL/uL (ref 3.87–5.11)
RDW: 13.3 % (ref 11.5–15.5)
WBC: 7.4 10*3/uL (ref 4.0–10.5)

## 2015-03-15 LAB — BRAIN NATRIURETIC PEPTIDE: B Natriuretic Peptide: 102.1 pg/mL — ABNORMAL HIGH (ref 0.0–100.0)

## 2015-03-15 LAB — BASIC METABOLIC PANEL
Anion gap: 8 (ref 5–15)
BUN: 24 mg/dL — ABNORMAL HIGH (ref 6–20)
CO2: 29 mmol/L (ref 22–32)
Calcium: 8.8 mg/dL — ABNORMAL LOW (ref 8.9–10.3)
Chloride: 107 mmol/L (ref 101–111)
Creatinine, Ser: 0.96 mg/dL (ref 0.44–1.00)
GFR calc Af Amer: >60 mL/min (ref >60)
GFR calc non Af Amer: 56 mL/min — ABNORMAL LOW (ref >60)
Glucose, Bld: 120 mg/dL — ABNORMAL HIGH (ref 65–99)
Potassium: 4.2 mmol/L (ref 3.5–5.1)
Sodium: 144 mmol/L (ref 135–145)

## 2015-03-15 MED ORDER — DOXYCYCLINE HYCLATE 100 MG PO CAPS: 100.0000 mg | ORAL_CAPSULE | Freq: Two times a day (BID) | ORAL | Status: DC

## 2015-03-15 NOTE — ED Provider Notes (Signed)
CSN: [id number]     Arrival date & time 03/15/15  1140 History   First MD Initiated Contact with Patient 03/15/15 1154     Chief Complaint  Patient presents with  . Leg Swelling     (Consider location/radiation/quality/duration/timing/severity/associated sxs/prior Treatment) HPI  Pt presents with c/o bilateral leg swelling.  Pt states that she first noted the swelling last night and this morning it is worse.  Swelling in symmetric.  No pain.  No chest pain, no difficulty breathing.  No redness of skin.  She has no hx of heart problems, no known kidney disorders.  No recent change in activity or diet.  There are no other associated systemic symptoms, there are no other alleviating or modifying factors.   Past Medical History  Diagnosis Date  . Incontinence     not indicated at this visit.  . Wears dentures   . Osteoporosis   . Bulging disc   . Hyperlipidemia   . Chronic bronchitis     due to congestion at times, on prednisone and advair  . GERD (gastroesophageal reflux disease)   . Neuropathy     legs stay numb   . Depression     many years ago  . Hypertension     followed by intern med Dr. Marisue [place] [phone]  . Tubulovillous adenoma of rectum   . Balance disorder 2008.      Falls a lot.  She can be standing and then leans too far over to one side   . Wears glasses   . Hearing loss     mild  . Iatrogenic adrenal insufficiency   . Osteoarthritis     hands,   . COPD (chronic obstructive pulmonary disease)   . Shortness of breath dyspnea    Past Surgical History  Procedure Laterality Date  . Incontinence surgery      multiple procedures, not cured  . Rectal surgery      by dr. Marlou Starks, removal of polyp  . Appendectomy    . Abdominal hysterectomy    . Tonsillectomy    . Eye surgery      bilateral cataract surgery and lens implant  . Dilation and curettage of uterus    . Rectal biopsy  09/21/2011    Procedure: BIOPSY RECTAL;  Surgeon: Merrie Roof, MD;  Location: [facility];   Service: General;  Laterality: N/A;  3-4 cm  . Finger surgery      fusions and debridements for OA  . External fixation leg  10/25/2012    Procedure: EXTERNAL FIXATION LEG;  Surgeon: Rozanna Box, MD;  Location: [facility];  Service: Orthopedics;  Laterality: Right;  . Knee arthroscopy with medial menisectomy Right 09/19/2014    Procedure: RIGHT KNEE ARTHROSCOPY WITH MEDIAL AND LATERAL MENISECTOMIES. CHONDROPLASTY OF PATELLA-FEMORAL JOINT;  Surgeon: Alta Corning, MD;  Location: [facility];  Service: Orthopedics;  Laterality: Right;  . Knee arthroscopy with lateral menisectomy Right 09/19/2014    Procedure: KNEE ARTHROSCOPY WITH LATERAL MENISECTOMY;  Surgeon: Alta Corning, MD;  Location: [facility];  Service: Orthopedics;  Laterality: Right;  . Chondroplasty  09/19/2014    Procedure: CHONDROPLASTY;  Surgeon: Alta Corning, MD;  Location: [facility];  Service: Orthopedics;;   Family History  Problem Relation Age of Onset  . Cancer Sister     pt unaware of what kind  . High blood pressure Mother   . COPD Mother   . Heart attack  Father    History  Substance Use Topics  . Smoking status: Never Smoker   . Smokeless tobacco: Never Used  . Alcohol Use: No   OB History    No data available     Review of Systems  ROS reviewed and all otherwise negative except for mentioned in HPI    Allergies  Ertapenem; Codeine; Demerol; Morphine and related; Other; and Sulfate  Home Medications   Prior to Admission medications   Medication Sig Start Date End Date Taking? Authorizing Provider  albuterol (PROVENTIL) (2.5 MG/3ML) 0.083% nebulizer solution Take 3 mLs (2.5 mg total) by nebulization every 6 (six) hours as needed for wheezing or shortness of breath. 11/21/14  Yes [name], MD  amitriptyline (ELAVIL) 10 MG tablet TAKE 1 TABLET THREE TIMES DAILY Patient taking differently: Take on tablet twice daily 01/02/15  Yes [name], MD  aspirin 81  MG tablet Take 81 mg by mouth daily. Buys OTC   Yes Historical Provider, MD  cholecalciferol (VITAMIN D) 1000 UNITS tablet Take 5 tablets (5,000 Units total) by mouth daily. 10/28/12  Yes Lezlie Octave Black, NP  Fluticasone-Salmeterol (ADVAIR DISKUS) 250-50 MCG/DOSE AEPB Inhale 1 puff into the lungs 2 (two) times daily. 12/17/14  Yes [name], MD  Multiple Vitamin (MULTIVITAMIN) capsule Take 1 capsule by mouth daily.    Yes Historical Provider, MD  nystatin (MYCOSTATIN) 500000 UNITS TABS tablet Take 1 tablet by mouth. Takes every Friday usually   Yes Historical Provider, MD  omeprazole (PRILOSEC) 40 MG capsule TAKE ONE CAPSULE BY MOUTH TWICE DAILY FOR ACID REFLUX 06/01/14  Yes [name], MD  oxybutynin (DITROPAN) 5 MG tablet Take 1 tablet (5 mg total) by mouth daily. 12/24/14  Yes [name], MD  potassium chloride SA (K-DUR,KLOR-CON) 20 MEQ tablet Take 1 tablet (20 mEq total) by mouth 2 (two) times daily. 12/13/14  Yes Vicie Mutters, PA-C  pravastatin (PRAVACHOL) 40 MG tablet TAKE ONE TABLET BY MOUTH ONCE DAILY FOR CHOLESTEROL 06/01/14  Yes [name], MD  predniSONE (DELTASONE) 10 MG tablet Take 10 mg by mouth daily. Take continuously for chest congestion 02/12/15  Yes Historical Provider, MD  traMADol (ULTRAM) 50 MG tablet Take 100 mg by mouth 2 (two) times daily. To help with cough   Yes Historical Provider, MD  valsartan (DIOVAN) 160 MG tablet Take 1 tablet (160 mg total) by mouth daily. Patient taking differently: Take 160 mg by mouth daily. Take half a tablet daily as advised by prescriber 01/16/15 01/16/16 Yes Courtney Forcucci, PA-C  nystatin (MYCOSTATIN) 100000 UNIT/ML suspension Swish and spit 5 ml QID for two days after symptoms resolve. Patient not taking: Reported on 03/15/2015 09/10/14   [name], MD   BP 137/58 mmHg  Pulse 76  Temp(Src) 98 F (36.7 C) (Oral)  Resp 18  SpO2 99%  Vitals reviewed Physical Exam  Physical Examination: General appearance - alert, well  appearing, and in no distress Mental status - alert, oriented to person, place, and time Eyes - no conjunctival injection, no scleral icterus Mouth - mucous membranes moist, pharynx normal without lesions Chest - clear to auscultation, no wheezes, rales or rhonchi, symmetric air entry Heart - normal rate, regular rhythm, normal S1, S2, no murmurs, rubs, clicks or gallops Abdomen - soft, nontender, nondistended, no masses or organomegaly Extremities - peripheral pulses normal, bilateral lower extremity edema- 1+ pitting, no clubbing or cyanosis Skin - normal coloration and turgor, no rashes  ED Course  Procedures (including critical care time) Labs  Review Labs Reviewed  BRAIN NATRIURETIC PEPTIDE - Abnormal; Notable for the following:    B Natriuretic Peptide 102.1 (*)    All other components within normal limits  BASIC METABOLIC PANEL - Abnormal; Notable for the following:    Glucose, Bld 120 (*)    BUN 24 (*)    Calcium 8.8 (*)    GFR calc non Af Amer 56 (*)    All other components within normal limits  CBC    Imaging Review No results found.   EKG Interpretation None      MDM   Final diagnoses:  Bilateral lower extremity edema    Pt presenting with c/o bialteral lower extremity edema- no asymmetry, labs including renal function and BNP are reassuring.  Low concern for DVT, on recheck right lower extremity has some new erythema since my initial exam and mild warmth overlying- may be developing cellulitis.  Pt started on po abx and will arrange for a recheck with her PMD.  Discharged with strict return precautions.  Pt agreeable with plan.    Alfonzo Beers, MD 03/17/15 [phone]

## 2015-03-15 NOTE — Discharge Instructions (Signed)
Return to the ED with any concerns including increased swelling, pain in legs, chest pain, difficulty breathing, fainting, decreased level of alertness/lethargy, or any other alarming symptoms  You should keep legs elevated as much as possible and arrange for close followup with your primary care doctor

## 2015-03-15 NOTE — ED Notes (Signed)
Delay on lab draw, edp examaning pt

## 2015-03-15 NOTE — ED Notes (Signed)
Pt presents to ED C/O bilateral leg edema. Pt stated that she noticed swelling last night and swelling has continued to get worse. Denies pain, numbness or tingling. Pitting edema noted upon assessment

## 2015-03-16 ENCOUNTER — Encounter: Payer: Self-pay | Admitting: Internal Medicine

## 2015-03-16 NOTE — Patient Instructions (Addendum)
Recommend Adult Low dose Aspirin or baby Aspirin 81 mg daily   To reduce risk of Colon Cancer 20 %,   Skin Cancer 26 % ,   Melanoma 46%   and   Pancreatic cancer 60%  ++++++++++++++++++  Vitamin D goal is between 70-100.   Please make sure that you are taking your Vitamin D as directed.   It is very important as a natural anti-inflammatory   helping hair, skin, and nails, as well as reducing stroke and heart attack risk.   It helps your bones and helps with mood.  It also decreases numerous cancer risks so please take it as directed.   Low Vit D is associated with a 200-300% higher risk for CANCER   and 200-300% higher risk for HEART   ATTACK  &  STROKE.    ......................................  It is also associated with higher death rate at younger ages,   autoimmune diseases like Rheumatoid arthritis, Lupus, Multiple Sclerosis.     Also many other serious conditions, like depression, Alzheimer's  Dementia, infertility, muscle aches, fatigue, fibromyalgia - just to name a few.  +++++++++++++++++++    Recommend the book "The END of DIETING" by Dr Joel Fuhrman   & the book "The END of DIABETES " by Dr Joel Fuhrman  At [employer] - get book & Audio CD's     Being diabetic has a  300% increased risk for heart attack, stroke, cancer, and alzheimer- type vascular dementia. It is very important that you work harder with diet by avoiding all foods that are white. Avoid white rice (brown & wild rice is OK), white potatoes (sweetpotatoes in moderation is OK), White bread or wheat bread or anything made out of white flour like bagels, donuts, rolls, buns, biscuits, cakes, pastries, cookies, pizza crust, and pasta (made from white flour & egg whites) - vegetarian pasta or spinach or wheat pasta is OK. Multigrain breads like Arnold's or [employer], or multigrain sandwich thins or flatbreads.  Diet, exercise and weight loss can reverse and cure diabetes in the early  stages.  Diet, exercise and weight loss is very important in the control and prevention of complications of diabetes which affects every system in your body, ie. Brain - dementia/stroke, eyes - glaucoma/blindness, heart - heart attack/heart failure, kidneys - dialysis, stomach - gastric paralysis, intestines - malabsorption, nerves - severe painful neuritis, circulation - gangrene & loss of a leg(s), and finally cancer and Alzheimers.    I recommend avoid fried & greasy foods,  sweets/candy, white rice (brown or wild rice or Quinoa is OK), white potatoes (sweet potatoes are OK) - anything made from white flour - bagels, doughnuts, rolls, buns, biscuits,white and wheat breads, pizza crust and traditional pasta made of white flour & egg white(vegetarian pasta or spinach or wheat pasta is OK).  Multi-grain bread is OK - like multi-grain flat bread or sandwich thins. Avoid alcohol in excess. Exercise is also important.    Eat all the vegetables you want - avoid meat, especially red meat and dairy - especially cheese.  Cheese is the most concentrated form of trans-fats which is the worst thing to clog up our arteries. Veggie cheese is OK which can be found in the fresh produce section at [employer] or [employer] or [employer]  ++++++++++++++++++++++++++  Preventive Care for Adults  A healthy lifestyle and preventive care can promote health and wellness. Preventive health guidelines for women include the following key practices.  A routine yearly physical is   a good way to check with your health care provider about your health and preventive screening. It is a chance to share any concerns and updates on your health and to receive a thorough exam.  Visit your dentist for a routine exam and preventive care every 6 months. Brush your teeth twice a day and floss once a day. Good oral hygiene prevents tooth decay and gum disease.  The frequency of eye exams is based on your age, health, family medical  history, use of contact lenses, and other factors. Follow your health care provider's recommendations for frequency of eye exams.  Eat a healthy diet. Foods like vegetables, fruits, whole grains, low-fat dairy products, and lean protein foods contain the nutrients you need without too many calories. Decrease your intake of foods high in solid fats, added sugars, and salt. Eat the right amount of calories for you.Get information about a proper diet from your health care provider, if necessary.  Regular physical exercise is one of the most important things you can do for your health. Most adults should get at least 150 minutes of moderate-intensity exercise (any activity that increases your heart rate and causes you to sweat) each week. In addition, most adults need muscle-strengthening exercises on 2 or more days a week.  Maintain a healthy weight. The body mass index (BMI) is a screening tool to identify possible weight problems. It provides an estimate of body fat based on height and weight. Your health care provider can find your BMI and can help you achieve or maintain a healthy weight.For adults 20 years and older:  A BMI below 18.5 is considered underweight.  A BMI of 18.5 to 24.9 is normal.  A BMI of 25 to 29.9 is considered overweight.  A BMI of 30 and above is considered obese.  Maintain normal blood lipids and cholesterol levels by exercising and minimizing your intake of saturated fat. Eat a balanced diet with plenty of fruit and vegetables. If your lipid or cholesterol levels are high, you are over 50, or you are at high risk for heart disease, you may need your cholesterol levels checked more frequently.Ongoing high lipid and cholesterol levels should be treated with medicines if diet and exercise are not working.  If you smoke, find out from your health care provider how to quit. If you do not use tobacco, do not start.  Lung cancer screening is recommended for adults aged 55-80  years who are at high risk for developing lung cancer because of a history of smoking. A yearly low-dose CT scan of the lungs is recommended for people who have at least a 30-pack-year history of smoking and are a current smoker or have quit within the past 15 years. A pack year of smoking is smoking an average of 1 pack of cigarettes a day for 1 year (for example: 1 pack a day for 30 years or 2 packs a day for 15 years). Yearly screening should continue until the smoker has stopped smoking for at least 15 years. Yearly screening should be stopped for people who develop a health problem that would prevent them from having lung cancer treatment.  Avoid use of street drugs. Do not share needles with anyone. Ask for help if you need support or instructions about stopping the use of drugs.  High blood pressure causes heart disease and increases the risk of stroke.  Ongoing high blood pressure should be treated with medicines if weight loss and exercise do not work.  If   you are 55-79 years old, ask your health care provider if you should take aspirin to prevent strokes.  Diabetes screening involves taking a blood sample to check your fasting blood sugar level. This should be done once every 3 years, after age 45, if you are within normal weight and without risk factors for diabetes. Testing should be considered at a younger age or be carried out more frequently if you are overweight and have at least 1 risk factor for diabetes.  Breast cancer screening is essential preventive care for women. You should practice "breast self-awareness." This means understanding the normal appearance and feel of your breasts and may include breast self-examination. Any changes detected, no matter how small, should be reported to a health care provider. Women in their 20s and 30s should have a clinical breast exam (CBE) by a health care provider as part of a regular health exam every 1 to 3 years. After age 40, women should have a  CBE every year. Starting at age 40, women should consider having a mammogram (breast X-ray test) every year. Women who have a family history of breast cancer should talk to their health care provider about genetic screening. Women at a high risk of breast cancer should talk to their health care providers about having an MRI and a mammogram every year.  Breast cancer gene (BRCA)-related cancer risk assessment is recommended for women who have family members with BRCA-related cancers. BRCA-related cancers include breast, ovarian, tubal, and peritoneal cancers. Having family members with these cancers may be associated with an increased risk for harmful changes (mutations) in the breast cancer genes BRCA1 and BRCA2. Results of the assessment will determine the need for genetic counseling and BRCA1 and BRCA2 testing.  Routine pelvic exams to screen for cancer are no longer recommended for nonpregnant women who are considered low risk for cancer of the pelvic organs (ovaries, uterus, and vagina) and who do not have symptoms. Ask your health care provider if a screening pelvic exam is right for you.  If you have had past treatment for cervical cancer or a condition that could lead to cancer, you need Pap tests and screening for cancer for at least 20 years after your treatment. If Pap tests have been discontinued, your risk factors (such as having a new sexual partner) need to be reassessed to determine if screening should be resumed. Some women have medical problems that increase the chance of getting cervical cancer. In these cases, your health care provider may recommend more frequent screening and Pap tests.    Colorectal cancer can be detected and often prevented. Most routine colorectal cancer screening begins at the age of 50 years and continues through age 75 years. However, your health care provider may recommend screening at an earlier age if you have risk factors for colon cancer. On a yearly basis,  your health care provider may provide home test kits to check for hidden blood in the stool. Use of a small camera at the end of a tube, to directly examine the colon (sigmoidoscopy or colonoscopy), can detect the earliest forms of colorectal cancer. Talk to your health care provider about this at age 50, when routine screening begins. Direct exam of the colon should be repeated every 5-10 years through age 75 years, unless early forms of pre-cancerous polyps or small growths are found.  Osteoporosis is a disease in which the bones lose minerals and strength with aging. This can result in serious bone fractures or breaks. The   risk of osteoporosis can be identified using a bone density scan. Women ages 65 years and over and women at risk for fractures or osteoporosis should discuss screening with their health care providers. Ask your health care provider whether you should take a calcium supplement or vitamin D to reduce the rate of osteoporosis.  Menopause can be associated with physical symptoms and risks. Hormone replacement therapy is available to decrease symptoms and risks. You should talk to your health care provider about whether hormone replacement therapy is right for you.  Use sunscreen. Apply sunscreen liberally and repeatedly throughout the day. You should seek shade when your shadow is shorter than you. Protect yourself by wearing long sleeves, pants, a wide-brimmed hat, and sunglasses year round, whenever you are outdoors.  Once a month, do a whole body skin exam, using a mirror to look at the skin on your back. Tell your health care provider of new moles, moles that have irregular borders, moles that are larger than a pencil eraser, or moles that have changed in shape or color.  Stay current with required vaccines (immunizations).  Influenza vaccine. All adults should be immunized every year.  Tetanus, diphtheria, and acellular pertussis (Td, Tdap) vaccine. Pregnant women should receive  1 dose of Tdap vaccine during each pregnancy. The dose should be obtained regardless of the length of time since the last dose. Immunization is preferred during the 27th-36th week of gestation. An adult who has not previously received Tdap or who does not know her vaccine status should receive 1 dose of Tdap. This initial dose should be followed by tetanus and diphtheria toxoids (Td) booster doses every 10 years. Adults with an unknown or incomplete history of completing a 3-dose immunization series with Td-containing vaccines should begin or complete a primary immunization series including a Tdap dose. Adults should receive a Td booster every 10 years.    Zoster vaccine. One dose is recommended for adults aged 60 years or older unless certain conditions are present.    Pneumococcal 13-valent conjugate (PCV13) vaccine. When indicated, a person who is uncertain of her immunization history and has no record of immunization should receive the PCV13 vaccine. An adult aged 19 years or older who has certain medical conditions and has not been previously immunized should receive 1 dose of PCV13 vaccine. This PCV13 should be followed with a dose of pneumococcal polysaccharide (PPSV23) vaccine. The PPSV23 vaccine dose should be obtained at least 8 weeks after the dose of PCV13 vaccine. An adult aged 19 years or older who has certain medical conditions and previously received 1 or more doses of PPSV23 vaccine should receive 1 dose of PCV13. The PCV13 vaccine dose should be obtained 1 or more years after the last PPSV23 vaccine dose.    Pneumococcal polysaccharide (PPSV23) vaccine. When PCV13 is also indicated, PCV13 should be obtained first. All adults aged 65 years and older should be immunized. An adult younger than age 65 years who has certain medical conditions should be immunized. Any person who resides in a nursing home or long-term care facility should be immunized. An adult smoker should be immunized.  People with an immunocompromised condition and certain other conditions should receive both PCV13 and PPSV23 vaccines. People with human immunodeficiency virus (HIV) infection should be immunized as soon as possible after diagnosis. Immunization during chemotherapy or radiation therapy should be avoided. Routine use of PPSV23 vaccine is not recommended for American Indians, [place] Natives, or people younger than 65 years unless there are medical   conditions that require PPSV23 vaccine. When indicated, people who have unknown immunization and have no record of immunization should receive PPSV23 vaccine. One-time revaccination 5 years after the first dose of PPSV23 is recommended for people aged 19-64 years who have chronic kidney failure, nephrotic syndrome, asplenia, or immunocompromised conditions. People who received 1-2 doses of PPSV23 before age 65 years should receive another dose of PPSV23 vaccine at age 65 years or later if at least 5 years have passed since the previous dose. Doses of PPSV23 are not needed for people immunized with PPSV23 at or after age 65 years.   Preventive Services / Frequency  Ages 65 years and over  Blood pressure check.  Lipid and cholesterol check.  Lung cancer screening. / Every year if you are aged 55-80 years and have a 30-pack-year history of smoking and currently smoke or have quit within the past 15 years. Yearly screening is stopped once you have quit smoking for at least 15 years or develop a health problem that would prevent you from having lung cancer treatment.  Clinical breast exam.** / Every year after age 40 years.  BRCA-related cancer risk assessment.** / For women who have family members with a BRCA-related cancer (breast, ovarian, tubal, or peritoneal cancers).  Mammogram.** / Every year beginning at age 40 years and continuing for as long as you are in good health. Consult with your health care provider.  Pap test.** / Every 3 years starting at  age 30 years through age 65 or 70 years with 3 consecutive normal Pap tests. Testing can be stopped between 65 and 70 years with 3 consecutive normal Pap tests and no abnormal Pap or HPV tests in the past 10 years.  Fecal occult blood test (FOBT) of stool. / Every year beginning at age 50 years and continuing until age 75 years. You may not need to do this test if you get a colonoscopy every 10 years.  Flexible sigmoidoscopy or colonoscopy.** / Every 5 years for a flexible sigmoidoscopy or every 10 years for a colonoscopy beginning at age 50 years and continuing until age 75 years.  Hepatitis C blood test.** / For all people born from 1945 through 1965 and any individual with known risks for hepatitis C.  Osteoporosis screening.** / A one-time screening for women ages 65 years and over and women at risk for fractures or osteoporosis.  Skin self-exam. / Monthly.  Influenza vaccine. / Every year.  Tetanus, diphtheria, and acellular pertussis (Tdap/Td) vaccine.** / 1 dose of Td every 10 years.  Zoster vaccine.** / 1 dose for adults aged 60 years or older.  Pneumococcal 13-valent conjugate (PCV13) vaccine.** / Consult your health care provider.  Pneumococcal polysaccharide (PPSV23) vaccine.** / 1 dose for all adults aged 65 years and older. Screening for abdominal aortic aneurysm (AAA)  by ultrasound is recommended for people who have history of high blood pressure or who are current or former smokers. 

## 2015-03-16 NOTE — Progress Notes (Signed)
Patient ID: Marie [name], female   DOB: [birth year], 79 y.o.   MRN: [id number]  MEDICARE ANNUAL WELLNESS VISIT AND OV   Assessment:   1. Essential hypertension  - TSH  2. Hyperlipidemia  - Lipid panel  3. Prediabetes  - Hemoglobin A1c - Insulin, random  4. Vitamin D deficiency  - Vit D  25 hydroxy   5. Adrenal insufficiency   6. COPD   7. Hereditary and idiopathic peripheral neuropathy   8. Unstable Gait   9. Depression screen   10. Need for prophylactic vaccination and inoculation against varicella  - Varicella-zoster vaccine subcutaneous  11. At high risk for falls   12. Routine general medical examination at a health care facility   13. Medication management  - CBC with Differential/Platelet - BASIC METABOLIC PANEL WITH GFR - Hepatic function panel - Magnesium  Plan:   During the course of the visit the patient was educated and counseled about appropriate screening and preventive services including:    Pneumococcal vaccine   Influenza vaccine  Td vaccine  Screening electrocardiogram  Bone densitometry screening  Colorectal cancer screening  Diabetes screening  Glaucoma screening  Nutrition counseling   Advanced directives: requested  Screening recommendations, referrals: Vaccinations:  Immunization History  Administered Date(s) Administered  . Influenza, High Dose Seasonal PF 08/23/2014  . Pneumococcal Conjugate-13 08/23/2014  . Pneumococcal-Unspecified 07/20/2003, 08/17/2013  . Td 08/18/2003  . Tdap 10/25/2012  Shingles vaccine .03/19/2015 Hep B vaccine not indicated  Nutrition assessed and recommended  Colonoscopy 03/20/2011 Recommended yearly ophthalmology/optometry visit for glaucoma screening and checkup Recommended yearly dental visit for hygiene and checkup Advanced directives - No - offered forms  Conditions/risks identified: BMI: Discussed weight loss, diet, and increase physical activity.  Increase  physical activity: AHA recommends 150 minutes of physical activity a week.  Medications reviewed PreDiabetes is not at goal, ACE/ARB therapy: Yes. Urinary Incontinence is not an issue: discussed non pharmacology and pharmacology options.  Fall risk: high- discussed PT, home fall assessment, medications.   Subjective:    Marie [name]  presents for Medicare Annual Wellness Visit and OV.  Date of last medicare wellness visit was 12/27/2013.  This very nice 79 y.o. MWF also  presents for follow up with Hypertension, COPD, Hyperlipidemia, Unstable Gait, Pre-Diabetes and Vitamin D Deficiency. Pt's COPD has improved since starting on low dose alternate day steroids with improved quality of life, but unfortunately she's dependent on her steroids now after long term use. Patient also has an unstable undefined gait disorder and has has neurology consultation in the past. She has finally reconciled herself to her need to use a assistive support walker to avoid falls  And injuries.    Patient is treated for HTN since 1996 & BP has been controlled at home. Today's BP: 122/74 mmHg. Patient has had no complaints of any cardiac type chest pain, palpitations, dyspnea/orthopnea/PND, dizziness, claudication, or dependent edema.   Hyperlipidemia is controlled with diet & meds. Patient denies myalgias or other med SE's. Last Lipids were Total Chol 188; HDL 69; LDL  87; Trig 162 on 12/13/2014.   Also, the patient has history of PreDiabetes since 2011 with an A1c 6.0% and has had no symptoms of reactive hypoglycemia, diabetic polys, paresthesias or visual blurring.  Last A1c was  6.1% on 12/13/2014.    Further, the patient also has history of Vitamin D Deficiency of 10 in 2008 and supplements vitamin D without any suspected side-effects. Last vitamin D was  52 on  08/23/2014.   Names of Other Physician/Practitioners you currently use: 1. [facility] here for primary care 2.  [place], eye doctor, last visit 2015 3. Dr Harrington Challenger, DDS / Linna Hoff, dentist, last visit 6 months ago  Patient Care Team: [name], MD as PCP - General (Internal Medicine) Dorna Leitz, MD as Consulting Physician (Orthopedic Surgery) Teena Irani, MD as Consulting Physician (Gastroenterology)  Medication Review: Medication Sig  . albuterol  (2.5 MG/3ML) 0.083% neb soln Take 3 mLs (2.5 mg total) by nebulization every 6 (six) hours as needed   . amitriptyline  10 MG tablet TAKE 1 TABLET 2 - 3  X DAILY   . aspirin 81 MG tablet Take 81 mg  daily  . VITAMIN D 1000 UNITS tablet Take 5,000 Units  daily.  Marland Kitchen doxycycline  100 MG capsule Take 1 capsule  2  times daily.  Marland Kitchen ADVAIR DISKUS 250-50 Inhale 1 puff into the lungs 2 (two) times daily.  . Multiple Vitamin  capsule Take 1 capsule by mouth daily.   Marland Kitchen omeprazole 40 MG capsule TAKE ONE CAPSULE BY MOUTH TWICE DAILY FOR ACID REFLUX  . oxybutynin  5 MG tablet Take 1 tablet (5 mg total) by mouth daily.  . KCl SA K-DUR   20 MEQ tablet Take 1 tablet (20 mEq total) by mouth 2 (two) times daily.  . pravastatin  40 MG tablet TAKE ONE TABLET BY MOUTH ONCE DAILY FOR CHOLESTEROL  . predniSONE  10 MG tablet Take 10 mg by mouth daily. Take continuously for chest congestion  . traMADol  50 MG tablet Take 100 mg by mouth 2 (two) times daily. To help with cough  . valsartan  160 MG tablet Take 1 tablet (160 mg total) by mouth daily.    Current Problems (verified) Patient Active Problem List   Diagnosis Date Noted  . Acute exacerbation of CHF (congestive heart failure) 11/15/2014  . Asthma 11/12/2014  . Prediabetes 08/23/2014  . Vitamin D deficiency 08/23/2014  . Medication management 08/23/2014  . COPD 01/30/2014  . Unstable Gait 01/30/2014  . UTI (lower urinary tract infection) 10/27/2012  . Incontinence   . Osteoarthritis   . Hyperlipidemia   . GERD (gastroesophageal reflux disease)   . Hereditary and idiopathic peripheral  neuropathy   . Hypertension   . Iatrogenic adrenal insufficiency   . Tubulovillous adenoma of rectum 09/04/2011   Screening Tests Health Maintenance  Topic Date Due  . ZOSTAVAX  06/25/1998  . INFLUENZA VACCINE  05/20/2015  . COLONOSCOPY  03/19/2021  . TETANUS/TDAP  08/18/2023  . DEXA SCAN  Completed  . PNA vac Low Risk Adult  Completed   Immunization History  Administered Date(s) Administered  . Influenza, High Dose Seasonal PF 08/23/2014  . Pneumococcal Conjugate-13 08/23/2014  . Pneumococcal-Unspecified 07/20/2003, 08/17/2013  . Td 08/17/2013  . Tdap 10/25/2012   Preventative care: Last colonoscopy: 03/20/2011  History reviewed: allergies, current medications, past family history, past medical history, past social history, past surgical history and problem list  Risk Factors: Tobacco History  Substance Use Topics  . Smoking status: Never Smoker   . Smokeless tobacco: Never Used  . Alcohol Use: No   She does not smoke.  Patient is not a former smoker. Are there smokers in your home (other than you)?  No Alcohol Current alcohol use: none  Caffeine Current caffeine use: coffee 2 cups /day  Exercise Current exercise: none  Nutrition/Diet Current diet: in general, a "healthy"  diet    Cardiac risk factors: advanced age (older than 61 for men, 50 for women), dyslipidemia, family history of premature cardiovascular disease, hypertension and sedentary lifestyle.  Depression Screen (Note: if answer to either of the following is "Yes", a more complete depression screening is indicated)   Q1: Over the past two weeks, have you felt down, depressed or hopeless? No  Q2: Over the past two weeks, have you felt little interest or pleasure in doing things? No  Have you lost interest or pleasure in daily life? No  Do you often feel hopeless? No  Do you cry easily over simple problems? No  Activities of Daily Living In your present state of health, do you have any difficulty  performing the following activities?:  Driving? No Managing money?  No Feeding yourself? No Getting from bed to chair? No Climbing a flight of stairs? No Preparing food and eating?: No Bathing or showering? No Getting dressed: No Getting to the toilet? No Using the toilet:No Moving around from place to place: No In the past year have you fallen or had a near fall?:Yes   Are you sexually active?  No  Do you have more than one partner?  No  Vision Difficulties: No  Hearing Difficulties: No Do you often ask people to speak up or repeat themselves? No Do you experience ringing or noises in your ears? No Do you have difficulty understanding soft or whispered voices? Sometimes.  Cognition  Do you feel that you have a problem with memory?No  Do you often misplace items? No  Do you feel safe at home?  Yes  Advanced directives Does patient have a [place]? No - offered forms Does patient have a Living Will? No - offered  Past Medical History  Diagnosis Date  . Incontinence     not indicated at this visit.  . Wears dentures   . Osteoporosis   . Bulging disc   . Hyperlipidemia   . Chronic bronchitis     due to congestion at times, on prednisone and advair  . GERD (gastroesophageal reflux disease)   . Neuropathy     legs stay numb   . Depression     many years ago  . Hypertension     followed by intern med Dr. Marisue Brooklyn [phone]  . Tubulovillous adenoma of rectum   . Balance disorder 2008.      Falls a lot.  She can be standing and then leans too far over to one side   . Wears glasses   . Hearing loss     mild  . Iatrogenic adrenal insufficiency   . Osteoarthritis     hands,   . COPD (chronic obstructive pulmonary disease)   . Shortness of breath dyspnea    Past Surgical History  Procedure Laterality Date  . Incontinence surgery      multiple procedures, not cured  . Rectal surgery      by dr. Marlou Starks, removal of polyp  . Appendectomy    .  Abdominal hysterectomy    . Tonsillectomy    . Eye surgery      bilateral cataract surgery and lens implant  . Dilation and curettage of uterus    . Rectal biopsy  09/21/2011    Procedure: BIOPSY RECTAL;  Surgeon: Merrie Roof, MD;  Location: [facility];  Service: General;  Laterality: N/A;  3-4 cm  . Finger surgery      fusions and debridements  for OA  . External fixation leg  10/25/2012    Procedure: EXTERNAL FIXATION LEG;  Surgeon: Rozanna Box, MD;  Location: [facility];  Service: Orthopedics;  Laterality: Right;  . Knee arthroscopy with medial menisectomy Right 09/19/2014    Procedure: RIGHT KNEE ARTHROSCOPY WITH MEDIAL AND LATERAL MENISECTOMIES. CHONDROPLASTY OF PATELLA-FEMORAL JOINT;  Surgeon: Alta Corning, MD;  Location: [facility];  Service: Orthopedics;  Laterality: Right;  . Knee arthroscopy with lateral menisectomy Right 09/19/2014    Procedure: KNEE ARTHROSCOPY WITH LATERAL MENISECTOMY;  Surgeon: Alta Corning, MD;  Location: [facility];  Service: Orthopedics;  Laterality: Right;  . Chondroplasty  09/19/2014    Procedure: CHONDROPLASTY;  Surgeon: Alta Corning, MD;  Location: [facility];  Service: Orthopedics;;   ROS: Constitutional: Denies fever, chills, weight loss/gain, headaches, insomnia, fatigue, night sweats, and change in appetite. Eyes: Denies redness, blurred vision, diplopia, discharge, itchy, watery eyes.  ENT: Denies discharge, congestion, post nasal drip, epistaxis, sore throat, earache, hearing loss, dental pain, Tinnitus, Vertigo, Sinus pain, snoring.  Cardio: Denies chest pain, palpitations, irregular heartbeat, syncope, dyspnea, diaphoresis, orthopnea, PND, claudication, edema Respiratory: denies cough, dyspnea, DOE, pleurisy, hoarseness, laryngitis, wheezing.  Gastrointestinal: Denies dysphagia, heartburn, reflux, water brash, pain, cramps, nausea, vomiting, bloating, diarrhea, constipation, hematemesis, melena, hematochezia,  jaundice, hemorrhoids Genitourinary: Denies dysuria, frequency, urgency, nocturia, hesitancy, discharge, hematuria, flank pain Breast: Breast lumps, nipple discharge, bleeding.  Musculoskeletal: Denies arthralgia, myalgia, stiffness, Jt. Swelling, pain, limp, and strain/sprain. Denies falls. Skin: Denies puritis, rash, hives, warts, acne, eczema, changing in skin lesion Neuro: No weakness, tremor, incoordination, spasms, paresthesia, pain Psychiatric: Denies confusion, memory loss, sensory loss. Denies Depression. Endocrine: Denies change in weight, skin, hair change, nocturia, and paresthesia, diabetic polys, visual blurring, hyper / hypo glycemic episodes.  Heme/Lymph: No excessive bleeding, bruising, enlarged lymph nodes  Objective:     BP 122/74   Pulse 76  Temp 97.1 F   Resp 16  Ht 5' 3.5"   Wt 183 lb 9.6 oz     BMI 32.00  General Appearance: Well nourished, alert, WD/WN, female and in no apparent distress. Eyes: PERRLA, EOMs, conjunctiva no swelling or erythema, normal fundi and vessels. Sinuses: No frontal/maxillary tenderness ENT/Mouth: EACs patent / TMs  nl. Nares clear without erythema, swelling, mucoid exudates. Oral hygiene is good. No erythema, swelling, or exudate. Tongue normal, non-obstructing. Tonsils not swollen or erythematous. Hearing normal.  Neck: Supple, thyroid normal. No bruits, nodes or JVD. Respiratory: Respiratory effort normal.  BS equal and clear bilateral without rales, rhonci, wheezing or stridor. Cardio: Heart sounds are normal with regular rate and rhythm and no murmurs, rubs or gallops. Peripheral pulses are normal and equal bilaterally without edema. No aortic or femoral bruits. Chest: symmetric with normal excursions and percussion. Abdomen: Flat, soft  with nl bowel sounds. Nontender, no guarding, rebound, hernias, masses, or organomegaly.  Lymphatics: Non tender without lymphadenopathy.  Musculoskeletal: Full ROM all peripheral extremities with  generalized decrease in muscle power, tone and bulk with gait stabilized with a rolling walker. Skin: Warm and dry without rashes, lesions, cyanosis, clubbing or  ecchymosis.  Neuro: Cranial nerves intact, reflexes equal bilaterally. Normal muscle tone, no cerebellar symptoms. Sensation intact.  Pysch: Alert and oriented X 3, normal affect, Insight and Judgment appropriate.   Cognitive Testing  Alert? Yes  Normal Appearance?Yes  Oriented to person? Yes  Place? Yes   Time? Yes  Recall of three objects?  Yes  Can perform  simple calculations? Yes  Displays appropriate judgment? Yes  Can read the correct time from a watch/clock?Yes  Medicare Attestation I have personally reviewed: The patient's medical and social history Their use of alcohol, tobacco or illicit drugs Their current medications and supplements The patient's functional ability including ADLs,fall risks, home safety risks, cognitive, and hearing and visual impairment Diet and physical activities Evidence for depression or mood disorders  The patient's weight, height, BMI, and visual acuity have been recorded in the chart.  I have made referrals, counseling, and provided education to the patient based on review of the above and I have provided the patient with a written personalized care plan for preventive services.  Over 40 minutes of exam, counseling, chart review was performed.  Marie Fontenot DAVID, MD   03/19/2015

## 2015-03-19 ENCOUNTER — Ambulatory Visit (INDEPENDENT_AMBULATORY_CARE_PROVIDER_SITE_OTHER): Payer: Medicare Other | Admitting: Internal Medicine

## 2015-03-19 ENCOUNTER — Encounter: Payer: Self-pay | Admitting: Internal Medicine

## 2015-03-19 VITALS — BP 122/74 | HR 76 | Temp 97.1°F | Resp 16 | Ht 63.5 in | Wt 183.6 lb

## 2015-03-19 DIAGNOSIS — Z23 Encounter for immunization: Secondary | ICD-10-CM | POA: Diagnosis not present

## 2015-03-19 DIAGNOSIS — I10 Essential (primary) hypertension: Secondary | ICD-10-CM | POA: Diagnosis not present

## 2015-03-19 DIAGNOSIS — E2749 Other adrenocortical insufficiency: Secondary | ICD-10-CM

## 2015-03-19 DIAGNOSIS — E785 Hyperlipidemia, unspecified: Secondary | ICD-10-CM

## 2015-03-19 DIAGNOSIS — R269 Unspecified abnormalities of gait and mobility: Secondary | ICD-10-CM

## 2015-03-19 DIAGNOSIS — Z0001 Encounter for general adult medical examination with abnormal findings: Secondary | ICD-10-CM | POA: Diagnosis not present

## 2015-03-19 DIAGNOSIS — J449 Chronic obstructive pulmonary disease, unspecified: Secondary | ICD-10-CM

## 2015-03-19 DIAGNOSIS — G609 Hereditary and idiopathic neuropathy, unspecified: Secondary | ICD-10-CM

## 2015-03-19 DIAGNOSIS — Z0000 Encounter for general adult medical examination without abnormal findings: Secondary | ICD-10-CM

## 2015-03-19 DIAGNOSIS — E559 Vitamin D deficiency, unspecified: Secondary | ICD-10-CM | POA: Diagnosis not present

## 2015-03-19 DIAGNOSIS — Z79899 Other long term (current) drug therapy: Secondary | ICD-10-CM | POA: Diagnosis not present

## 2015-03-19 DIAGNOSIS — R7303 Prediabetes: Secondary | ICD-10-CM

## 2015-03-19 DIAGNOSIS — Z9181 History of falling: Secondary | ICD-10-CM

## 2015-03-19 DIAGNOSIS — Z1331 Encounter for screening for depression: Secondary | ICD-10-CM

## 2015-03-19 DIAGNOSIS — R7309 Other abnormal glucose: Secondary | ICD-10-CM | POA: Diagnosis not present

## 2015-03-19 LAB — LIPID PANEL
CHOLESTEROL: 154 mg/dL (ref 0–200)
HDL: 63 mg/dL (ref >=46)
LDL Cholesterol: 58 mg/dL (ref 0–99)
Total CHOL/HDL Ratio: 2.4 Ratio
Triglycerides: 165 mg/dL — ABNORMAL HIGH (ref <150)
VLDL: 33 mg/dL (ref 0–40)

## 2015-03-19 LAB — CBC WITH DIFFERENTIAL/PLATELET
BASOS PCT: 1 % (ref 0–1)
Basophils Absolute: 0.1 10*3/uL (ref 0.0–0.1)
EOS ABS: 0.2 10*3/uL (ref 0.0–0.7)
Eosinophils Relative: 2 % (ref 0–5)
HCT: 44.3 % (ref 36.0–46.0)
Hemoglobin: 14.8 g/dL (ref 12.0–15.0)
LYMPHS PCT: 35 % (ref 12–46)
Lymphs Abs: 2.6 10*3/uL (ref 0.7–4.0)
MCH: 31.2 pg (ref 26.0–34.0)
MCHC: 33.4 g/dL (ref 30.0–36.0)
MCV: 93.3 fL (ref 78.0–100.0)
MONOS PCT: 7 % (ref 3–12)
MPV: 10.1 fL (ref 8.6–12.4)
Monocytes Absolute: 0.5 10*3/uL (ref 0.1–1.0)
Neutro Abs: 4.1 10*3/uL (ref 1.7–7.7)
Neutrophils Relative %: 55 % (ref 43–77)
Platelets: 267 10*3/uL (ref 150–400)
RBC: 4.75 MIL/uL (ref 3.87–5.11)
RDW: 13.3 % (ref 11.5–15.5)
WBC: 7.5 10*3/uL (ref 4.0–10.5)

## 2015-03-19 LAB — MAGNESIUM: MAGNESIUM: 1.9 mg/dL (ref 1.5–2.5)

## 2015-03-19 LAB — TSH: TSH: 3.617 u[IU]/mL (ref 0.350–4.500)

## 2015-03-19 LAB — HEPATIC FUNCTION PANEL
ALT: 20 U/L (ref 0–35)
AST: 18 U/L (ref 0–37)
Albumin: 3.7 g/dL (ref 3.5–5.2)
Alkaline Phosphatase: 49 U/L (ref 39–117)
Bilirubin, Direct: 0.1 mg/dL (ref 0.0–0.3)
Indirect Bilirubin: 0.4 mg/dL (ref 0.2–1.2)
TOTAL PROTEIN: 5.9 g/dL — AB (ref 6.0–8.3)
Total Bilirubin: 0.5 mg/dL (ref 0.2–1.2)

## 2015-03-19 LAB — BASIC METABOLIC PANEL WITH GFR
BUN: 21 mg/dL (ref 6–23)
CO2: 29 meq/L (ref 19–32)
CREATININE: 0.96 mg/dL (ref 0.50–1.10)
Calcium: 8.7 mg/dL (ref 8.4–10.5)
Chloride: 104 mEq/L (ref 96–112)
GFR, Est African American: 66 mL/min
GFR, Est Non African American: 58 mL/min — ABNORMAL LOW
GLUCOSE: 89 mg/dL (ref 70–99)
POTASSIUM: 4.2 meq/L (ref 3.5–5.3)
SODIUM: 142 meq/L (ref 135–145)

## 2015-03-19 MED ORDER — FUROSEMIDE 20 MG PO TABS: ORAL_TABLET | ORAL | Status: DC

## 2015-03-20 LAB — HEMOGLOBIN A1C
Hgb A1c MFr Bld: 5.9 % — ABNORMAL HIGH (ref <5.7)
Mean Plasma Glucose: 123 mg/dL — ABNORMAL HIGH (ref <117)

## 2015-03-20 LAB — INSULIN, RANDOM: Insulin: 8.6 u[IU]/mL (ref 2.0–19.6)

## 2015-03-20 LAB — VITAMIN D 25 HYDROXY (VIT D DEFICIENCY, FRACTURES): Vit D, 25-Hydroxy: 35 ng/mL (ref 30–100)

## 2015-04-02 DIAGNOSIS — L97521 Non-pressure chronic ulcer of other part of left foot limited to breakdown of skin: Secondary | ICD-10-CM | POA: Diagnosis not present

## 2015-04-02 DIAGNOSIS — M2042 Other hammer toe(s) (acquired), left foot: Secondary | ICD-10-CM | POA: Diagnosis not present

## 2015-04-02 DIAGNOSIS — M79675 Pain in left toe(s): Secondary | ICD-10-CM | POA: Diagnosis not present

## 2015-04-03 ENCOUNTER — Ambulatory Visit: Payer: Self-pay | Admitting: Physician Assistant

## 2015-04-08 ENCOUNTER — Other Ambulatory Visit: Payer: Self-pay | Admitting: Internal Medicine

## 2015-04-08 DIAGNOSIS — Z1231 Encounter for screening mammogram for malignant neoplasm of breast: Secondary | ICD-10-CM

## 2015-04-12 ENCOUNTER — Other Ambulatory Visit: Payer: Self-pay | Admitting: Internal Medicine

## 2015-04-16 ENCOUNTER — Encounter: Payer: Self-pay | Admitting: Internal Medicine

## 2015-04-16 ENCOUNTER — Encounter (HOSPITAL_COMMUNITY): Payer: Self-pay | Admitting: *Deleted

## 2015-04-16 ENCOUNTER — Inpatient Hospital Stay (HOSPITAL_COMMUNITY)
Admission: EM | Admit: 2015-04-16 | Discharge: 2015-04-18 | DRG: 683 | Disposition: A | Discharge status: Home Health | Payer: Medicare Other | Attending: Internal Medicine | Admitting: Internal Medicine

## 2015-04-16 ENCOUNTER — Ambulatory Visit (INDEPENDENT_AMBULATORY_CARE_PROVIDER_SITE_OTHER): Payer: Medicare Other | Admitting: Internal Medicine

## 2015-04-16 ENCOUNTER — Emergency Department (HOSPITAL_COMMUNITY): Payer: Medicare Other

## 2015-04-16 VITALS — BP 72/40 | HR 80 | Temp 97.7°F | Resp 16

## 2015-04-16 DIAGNOSIS — Z7982 Long term (current) use of aspirin: Secondary | ICD-10-CM

## 2015-04-16 DIAGNOSIS — R197 Diarrhea, unspecified: Secondary | ICD-10-CM

## 2015-04-16 DIAGNOSIS — I959 Hypotension, unspecified: Secondary | ICD-10-CM

## 2015-04-16 DIAGNOSIS — Z888 Allergy status to other drugs, medicaments and biological substances status: Secondary | ICD-10-CM

## 2015-04-16 DIAGNOSIS — E860 Dehydration: Secondary | ICD-10-CM

## 2015-04-16 DIAGNOSIS — F329 Major depressive disorder, single episode, unspecified: Secondary | ICD-10-CM | POA: Diagnosis present

## 2015-04-16 DIAGNOSIS — M810 Age-related osteoporosis without current pathological fracture: Secondary | ICD-10-CM | POA: Diagnosis present

## 2015-04-16 DIAGNOSIS — Z7952 Long term (current) use of systemic steroids: Secondary | ICD-10-CM | POA: Diagnosis not present

## 2015-04-16 DIAGNOSIS — I10 Essential (primary) hypertension: Secondary | ICD-10-CM | POA: Diagnosis not present

## 2015-04-16 DIAGNOSIS — Z881 Allergy status to other antibiotic agents status: Secondary | ICD-10-CM | POA: Diagnosis not present

## 2015-04-16 DIAGNOSIS — J449 Chronic obstructive pulmonary disease, unspecified: Secondary | ICD-10-CM | POA: Diagnosis present

## 2015-04-16 DIAGNOSIS — E785 Hyperlipidemia, unspecified: Secondary | ICD-10-CM | POA: Diagnosis present

## 2015-04-16 DIAGNOSIS — Z8249 Family history of ischemic heart disease and other diseases of the circulatory system: Secondary | ICD-10-CM

## 2015-04-16 DIAGNOSIS — Z79891 Long term (current) use of opiate analgesic: Secondary | ICD-10-CM

## 2015-04-16 DIAGNOSIS — Z809 Family history of malignant neoplasm, unspecified: Secondary | ICD-10-CM

## 2015-04-16 DIAGNOSIS — Z825 Family history of asthma and other chronic lower respiratory diseases: Secondary | ICD-10-CM | POA: Diagnosis not present

## 2015-04-16 DIAGNOSIS — E876 Hypokalemia: Secondary | ICD-10-CM | POA: Diagnosis not present

## 2015-04-16 DIAGNOSIS — R112 Nausea with vomiting, unspecified: Secondary | ICD-10-CM

## 2015-04-16 DIAGNOSIS — E861 Hypovolemia: Secondary | ICD-10-CM | POA: Diagnosis present

## 2015-04-16 DIAGNOSIS — N179 Acute kidney failure, unspecified: Secondary | ICD-10-CM

## 2015-04-16 DIAGNOSIS — G629 Polyneuropathy, unspecified: Secondary | ICD-10-CM | POA: Diagnosis present

## 2015-04-16 DIAGNOSIS — H9190 Unspecified hearing loss, unspecified ear: Secondary | ICD-10-CM | POA: Diagnosis present

## 2015-04-16 DIAGNOSIS — K219 Gastro-esophageal reflux disease without esophagitis: Secondary | ICD-10-CM | POA: Diagnosis present

## 2015-04-16 DIAGNOSIS — E273 Drug-induced adrenocortical insufficiency: Secondary | ICD-10-CM | POA: Diagnosis present

## 2015-04-16 DIAGNOSIS — Z882 Allergy status to sulfonamides status: Secondary | ICD-10-CM

## 2015-04-16 DIAGNOSIS — Z79899 Other long term (current) drug therapy: Secondary | ICD-10-CM | POA: Diagnosis not present

## 2015-04-16 DIAGNOSIS — R05 Cough: Secondary | ICD-10-CM | POA: Diagnosis not present

## 2015-04-16 DIAGNOSIS — M1990 Unspecified osteoarthritis, unspecified site: Secondary | ICD-10-CM | POA: Diagnosis present

## 2015-04-16 DIAGNOSIS — I9589 Other hypotension: Secondary | ICD-10-CM | POA: Diagnosis present

## 2015-04-16 DIAGNOSIS — Z885 Allergy status to narcotic agent status: Secondary | ICD-10-CM

## 2015-04-16 LAB — BASIC METABOLIC PANEL
ANION GAP: 15 (ref 5–15)
BUN: 31 mg/dL — ABNORMAL HIGH (ref 6–20)
CALCIUM: 9.3 mg/dL (ref 8.9–10.3)
CO2: 30 mmol/L (ref 22–32)
CREATININE: 2.03 mg/dL — AB (ref 0.44–1.00)
Chloride: 94 mmol/L — ABNORMAL LOW (ref 101–111)
GFR, EST AFRICAN AMERICAN: 26 mL/min — AB (ref >60)
GFR, EST NON AFRICAN AMERICAN: 23 mL/min — AB (ref >60)
Glucose, Bld: 138 mg/dL — ABNORMAL HIGH (ref 65–99)
Potassium: 2.4 mmol/L — CL (ref 3.5–5.1)
Sodium: 139 mmol/L (ref 135–145)

## 2015-04-16 LAB — URINALYSIS, ROUTINE W REFLEX MICROSCOPIC
BILIRUBIN URINE: NEGATIVE
Glucose, UA: NEGATIVE mg/dL
Hgb urine dipstick: NEGATIVE
KETONES UR: NEGATIVE mg/dL
NITRITE: NEGATIVE
PROTEIN: NEGATIVE mg/dL
SPECIFIC GRAVITY, URINE: 1.012 (ref 1.005–1.030)
UROBILINOGEN UA: 0.2 mg/dL (ref 0.0–1.0)
pH: 6 (ref 5.0–8.0)

## 2015-04-16 LAB — HEPATIC FUNCTION PANEL
ALK PHOS: 58 U/L (ref 38–126)
ALT: 19 U/L (ref 14–54)
AST: 27 U/L (ref 15–41)
Albumin: 4.1 g/dL (ref 3.5–5.0)
Bilirubin, Direct: 0.2 mg/dL (ref 0.1–0.5)
Indirect Bilirubin: 0.9 mg/dL (ref 0.3–0.9)
TOTAL PROTEIN: 6.9 g/dL (ref 6.5–8.1)
Total Bilirubin: 1.1 mg/dL (ref 0.3–1.2)

## 2015-04-16 LAB — I-STAT CG4 LACTIC ACID, ED
LACTIC ACID, VENOUS: 2.65 mmol/L — AB (ref 0.5–2.0)
Lactic Acid, Venous: 1.68 mmol/L (ref 0.5–2.0)

## 2015-04-16 LAB — I-STAT TROPONIN, ED: TROPONIN I, POC: 0.03 ng/mL (ref 0.00–0.08)

## 2015-04-16 LAB — CBC
HCT: 48.6 % — ABNORMAL HIGH (ref 36.0–46.0)
Hemoglobin: 16.3 g/dL — ABNORMAL HIGH (ref 12.0–15.0)
MCH: 31.5 pg (ref 26.0–34.0)
MCHC: 33.5 g/dL (ref 30.0–36.0)
MCV: 94 fL (ref 78.0–100.0)
Platelets: 313 10*3/uL (ref 150–400)
RBC: 5.17 MIL/uL — ABNORMAL HIGH (ref 3.87–5.11)
RDW: 13.5 % (ref 11.5–15.5)
WBC: 12.7 10*3/uL — AB (ref 4.0–10.5)

## 2015-04-16 LAB — PROTIME-INR
INR: 1.03 (ref 0.00–1.49)
Prothrombin Time: 13.7 seconds (ref 11.6–15.2)

## 2015-04-16 LAB — MAGNESIUM: Magnesium: 2.3 mg/dL (ref 1.7–2.4)

## 2015-04-16 LAB — URINE MICROSCOPIC-ADD ON

## 2015-04-16 LAB — POC OCCULT BLOOD, ED: Fecal Occult Bld: NEGATIVE

## 2015-04-16 LAB — APTT: aPTT: 36 seconds (ref 24–37)

## 2015-04-16 MED ORDER — PRAVASTATIN SODIUM 40 MG PO TABS
40.0000 mg | ORAL_TABLET | Freq: Every day | ORAL | Status: DC
  Administered 2015-04-17: 40 mg via ORAL
  Filled 2015-04-16: qty 1

## 2015-04-16 MED ORDER — TRAMADOL HCL 50 MG PO TABS: 50.0000 mg | ORAL_TABLET | Freq: Four times a day (QID) | ORAL | Status: DC | PRN

## 2015-04-16 MED ORDER — SODIUM CHLORIDE 0.9 % IV BOLUS (SEPSIS)
1000.0000 mL | Freq: Once | INTRAVENOUS | Status: AC
  Administered 2015-04-16: 1000 mL via INTRAVENOUS

## 2015-04-16 MED ORDER — ACETAMINOPHEN 325 MG PO TABS: 650.0000 mg | ORAL_TABLET | Freq: Four times a day (QID) | ORAL | Status: DC | PRN

## 2015-04-16 MED ORDER — ADULT MULTIVITAMIN W/MINERALS CH
1.0000 | ORAL_TABLET | Freq: Every day | ORAL | Status: DC
  Administered 2015-04-17 – 2015-04-18 (×2): 1 via ORAL
  Filled 2015-04-16 (×2): qty 1

## 2015-04-16 MED ORDER — ACETAMINOPHEN 650 MG RE SUPP: 650.0000 mg | Freq: Four times a day (QID) | RECTAL | Status: DC | PRN

## 2015-04-16 MED ORDER — SODIUM CHLORIDE 0.9 % IV SOLN
INTRAVENOUS | Status: DC
  Administered 2015-04-16 – 2015-04-17 (×2): via INTRAVENOUS

## 2015-04-16 MED ORDER — ONDANSETRON HCL 4 MG PO TABS: 4.0000 mg | ORAL_TABLET | Freq: Four times a day (QID) | ORAL | Status: DC | PRN

## 2015-04-16 MED ORDER — MOMETASONE FURO-FORMOTEROL FUM 100-5 MCG/ACT IN AERO
2.0000 | INHALATION_SPRAY | Freq: Two times a day (BID) | RESPIRATORY_TRACT | Status: DC
  Administered 2015-04-17 – 2015-04-18 (×3): 2 via RESPIRATORY_TRACT
  Filled 2015-04-16: qty 8.8

## 2015-04-16 MED ORDER — OXYBUTYNIN CHLORIDE 5 MG PO TABS
5.0000 mg | ORAL_TABLET | Freq: Every day | ORAL | Status: DC
  Administered 2015-04-17 – 2015-04-18 (×2): 5 mg via ORAL
  Filled 2015-04-16 (×2): qty 1

## 2015-04-16 MED ORDER — HEPARIN SODIUM (PORCINE) 5000 UNIT/ML IJ SOLN
5000.0000 [IU] | Freq: Three times a day (TID) | INTRAMUSCULAR | Status: DC
  Administered 2015-04-16 – 2015-04-18 (×5): 5000 [IU] via SUBCUTANEOUS
  Filled 2015-04-16 (×6): qty 1

## 2015-04-16 MED ORDER — ONDANSETRON HCL 4 MG/2ML IJ SOLN: 4.0000 mg | Freq: Four times a day (QID) | INTRAMUSCULAR | Status: DC | PRN

## 2015-04-16 MED ORDER — VITAMIN B-1 100 MG PO TABS
100.0000 mg | ORAL_TABLET | Freq: Every day | ORAL | Status: DC
  Administered 2015-04-17 – 2015-04-18 (×2): 100 mg via ORAL
  Filled 2015-04-16 (×2): qty 1

## 2015-04-16 MED ORDER — POTASSIUM CHLORIDE CRYS ER 20 MEQ PO TBCR
40.0000 meq | EXTENDED_RELEASE_TABLET | Freq: Once | ORAL | Status: AC
  Administered 2015-04-16: 40 meq via ORAL
  Filled 2015-04-16: qty 2

## 2015-04-16 MED ORDER — VITAMIN D3 25 MCG (1000 UNIT) PO TABS
5000.0000 [IU] | ORAL_TABLET | Freq: Every day | ORAL | Status: DC
  Administered 2015-04-17 – 2015-04-18 (×2): 5000 [IU] via ORAL
  Filled 2015-04-16 (×4): qty 5

## 2015-04-16 MED ORDER — PREDNISONE 5 MG PO TABS
10.0000 mg | ORAL_TABLET | Freq: Every day | ORAL | Status: DC
  Administered 2015-04-17 – 2015-04-18 (×2): 10 mg via ORAL
  Filled 2015-04-16 (×2): qty 2

## 2015-04-16 MED ORDER — MAGNESIUM SULFATE 2 GM/50ML IV SOLN
2.0000 g | Freq: Once | INTRAVENOUS | Status: DC
  Filled 2015-04-16: qty 50

## 2015-04-16 MED ORDER — ALBUTEROL SULFATE (2.5 MG/3ML) 0.083% IN NEBU: 2.5000 mg | INHALATION_SOLUTION | Freq: Four times a day (QID) | RESPIRATORY_TRACT | Status: DC | PRN

## 2015-04-16 MED ORDER — SODIUM CHLORIDE 0.9 % IJ SOLN
3.0000 mL | Freq: Two times a day (BID) | INTRAMUSCULAR | Status: DC
  Administered 2015-04-16: 3 mL via INTRAVENOUS

## 2015-04-16 MED ORDER — PANTOPRAZOLE SODIUM 40 MG PO TBEC
80.0000 mg | DELAYED_RELEASE_TABLET | Freq: Every day | ORAL | Status: DC
  Administered 2015-04-17 – 2015-04-18 (×2): 80 mg via ORAL
  Filled 2015-04-16 (×2): qty 2

## 2015-04-16 MED ORDER — POTASSIUM CHLORIDE 10 MEQ/100ML IV SOLN
10.0000 meq | INTRAVENOUS | Status: AC
  Administered 2015-04-16 – 2015-04-17 (×4): 10 meq via INTRAVENOUS
  Filled 2015-04-16 (×6): qty 100

## 2015-04-16 MED ORDER — FOLIC ACID 1 MG PO TABS
1.0000 mg | ORAL_TABLET | Freq: Every day | ORAL | Status: DC
  Administered 2015-04-17 – 2015-04-18 (×2): 1 mg via ORAL
  Filled 2015-04-16 (×2): qty 1

## 2015-04-16 MED ORDER — ASPIRIN EC 81 MG PO TBEC
81.0000 mg | DELAYED_RELEASE_TABLET | Freq: Every day | ORAL | Status: DC
  Administered 2015-04-17 – 2015-04-18 (×2): 81 mg via ORAL
  Filled 2015-04-16 (×4): qty 1

## 2015-04-16 NOTE — ED Notes (Signed)
Two unsuccessful IV attempts by this RN  

## 2015-04-16 NOTE — Progress Notes (Signed)
   Subjective:    Patient ID: Marie [name], female    DOB: July 14, 1936, 79 y.o.   MRN: [id number]  HPI  Patient presents to the office for evaluation of vomiting, weakness, and intermittent diarrhea.  Patient states that for the last week she has been having intermittent diarrhea.  When she is having a bowel movement she gets nauseated and vomits.  She feels like she has some anorexia.  She has been eating minor amounts and can occasionally keep down food.  She is having BM twice daily.  She has not noticed any blood or black colors to her stool. She reports that it is darker than usual but not black.  She reports that she is weak but not short of breath.  She reports that she last vomited at lunch.  She reports that the last BM was at lunch.  She reports that she is not having any abom   Review of Systems     Objective:   Physical Exam  Constitutional: She is oriented to person, place, and time. She appears well-developed and well-nourished.  Ashen and ill appearing   HENT:  Head: Normocephalic and atraumatic.  Mouth/Throat: No oropharyngeal exudate.  Oropharynx dry, dentures in place  Eyes: Conjunctivae are normal. No scleral icterus.  Neck: Normal range of motion. Neck supple. No JVD present. No thyromegaly present.  Cardiovascular: An irregular rhythm present. Exam reveals no gallop.   Murmur heard. Pulmonary/Chest: Effort normal and breath sounds normal. No respiratory distress. She has no wheezes. She has no rales. She exhibits no tenderness.  Abdominal: Soft. Bowel sounds are normal. She exhibits no distension and no mass. There is no tenderness. There is no rebound and no guarding.  Musculoskeletal: Normal range of motion.  Lymphadenopathy:    She has no cervical adenopathy.  Neurological: She is alert and oriented to person, place, and time.  Skin: Skin is dry. There is pallor.  Psychiatric: She has a normal mood and affect. Her behavior is normal. Judgment and thought  content normal.  Nursing note and vitals reviewed.   Filed Vitals:   04/16/15 1644  BP: 72/40  Pulse: 80  Temp: 97.7 F (36.5 C)  Resp: 16         Assessment & Plan:   1. Dehydration -likely due to poor fluid intake and nausea and vomiting  2. Hypotension, unspecified hypotension type -severely hypotensive likely due to the above  3. Non-intractable vomiting with nausea, vomiting of unspecified type -exam of abdomen was relatively benign.  Question of possible infection?   4. Diarrhea -given dark stools possible GI bleed as well.    Patient presenting with severe hypotension likely related to dehydration from poor po intake due to diarrhea with nausea and vomiting.  Due to hypotension patient has likely renal failure.  Needs labs, rehydration, and to look for possible source of nausea and vomiting.  Differential includes diverticulitis, UTI, and less likely partial bowel obstruction.  Patient sent to ER in private vehicle.  ER told patient was coming.  Dr. Melford Aase agrees with sending patient to the ER.

## 2015-04-16 NOTE — ED Notes (Signed)
Notified RN Brandy of lactic acid

## 2015-04-16 NOTE — ED Provider Notes (Signed)
CSN: [id number]     Arrival date & time 04/16/15  1712 History   First MD Initiated Contact with Patient 04/16/15 1836     Chief Complaint  Patient presents with  . Hypotension     (Consider location/radiation/quality/duration/timing/severity/associated sxs/prior Treatment) HPI   Blood pressure 95/50, pulse 77, temperature 98.1 F (36.7 C), temperature source Oral, resp. rate 18, height 5\' 4"  (1.626 m), weight 170 lb (77.111 kg), SpO2 94 %.  Marie [name] is a 79 y.o. female  with past medical history significant for hypertension, COPD complaining of generalized fatigue, nonbloody, nonbilious, no coffee-ground emesis starting 3 days ago associated with darker than normal episodes of diarrhea. She denies abdominal pain, anticoagulation, NSAID use, excessive alcohol use, history of GI bleed, syncope, shortness of breath, palpitations, chest pain, fever, chills, sick contacts, recent antibiotics use, renal or cardiac issues  Past Medical History  Diagnosis Date  . Incontinence     not indicated at this visit.  . Wears dentures   . Osteoporosis   . Bulging disc   . Hyperlipidemia   . Chronic bronchitis     due to congestion at times, on prednisone and advair  . GERD (gastroesophageal reflux disease)   . Neuropathy     legs stay numb   . Depression     many years ago  . Hypertension     followed by intern med Dr. Marisue Brooklyn [phone]  . Tubulovillous adenoma of rectum   . Balance disorder 2008.      Falls a lot.  She can be standing and then leans too far over to one side   . Wears glasses   . Hearing loss     mild  . Iatrogenic adrenal insufficiency   . Osteoarthritis     hands,   . COPD (chronic obstructive pulmonary disease)   . Shortness of breath dyspnea    Past Surgical History  Procedure Laterality Date  . Incontinence surgery      multiple procedures, not cured  . Rectal surgery      by dr. Marlou Starks, removal of polyp  . Appendectomy    . Abdominal hysterectomy    .  Tonsillectomy    . Eye surgery      bilateral cataract surgery and lens implant  . Dilation and curettage of uterus    . Rectal biopsy  09/21/2011    Procedure: BIOPSY RECTAL;  Surgeon: Merrie Roof, MD;  Location: [place];  Service: General;  Laterality: N/A;  3-4 cm  . Finger surgery      fusions and debridements for OA  . External fixation leg  10/25/2012    Procedure: EXTERNAL FIXATION LEG;  Surgeon: Rozanna Box, MD;  Location: [place];  Service: Orthopedics;  Laterality: Right;  . Knee arthroscopy with medial menisectomy Right 09/19/2014    Procedure: RIGHT KNEE ARTHROSCOPY WITH MEDIAL AND LATERAL MENISECTOMIES. CHONDROPLASTY OF PATELLA-FEMORAL JOINT;  Surgeon: Alta Corning, MD;  Location: [place];  Service: Orthopedics;  Laterality: Right;  . Knee arthroscopy with lateral menisectomy Right 09/19/2014    Procedure: KNEE ARTHROSCOPY WITH LATERAL MENISECTOMY;  Surgeon: Alta Corning, MD;  Location: [place];  Service: Orthopedics;  Laterality: Right;  . Chondroplasty  09/19/2014    Procedure: CHONDROPLASTY;  Surgeon: Alta Corning, MD;  Location: [place];  Service: Orthopedics;;   Family History  Problem Relation Age of Onset  . Cancer Sister     pt  unaware of what kind  . High blood pressure Mother   . COPD Mother   . Heart attack Father    History  Substance Use Topics  . Smoking status: Never Smoker   . Smokeless tobacco: Never Used  . Alcohol Use: No   OB History    No data available     Review of Systems  10 systems reviewed and found to be negative, except as noted in the HPI.   Allergies  Ertapenem; Codeine; Demerol; Morphine and related; Other; and Sulfate  Home Medications   Prior to Admission medications   Medication Sig Start Date End Date Taking? Authorizing Provider  albuterol (PROVENTIL) (2.5 MG/3ML) 0.083% nebulizer solution Take 3 mLs (2.5 mg total) by nebulization every 6 (six) hours as needed for  wheezing or shortness of breath. 11/21/14   [name], MD  aspirin 81 MG tablet Take 81 mg by mouth daily. Buys OTC    Historical Provider, MD  cholecalciferol (VITAMIN D) 1000 UNITS tablet Take 5 tablets (5,000 Units total) by mouth daily. 10/28/12   Radene Gunning, NP  Fluticasone-Salmeterol (ADVAIR DISKUS) 250-50 MCG/DOSE AEPB Inhale 1 puff into the lungs 2 (two) times daily. 12/17/14   [name], MD  furosemide (LASIX) 20 MG tablet Take 1 tablet daily ONLY if needed for fluid retention & swelling 03/19/15 03/18/16  [name], MD  Multiple Vitamin (MULTIVITAMIN) capsule Take 1 capsule by mouth daily.     Historical Provider, MD  nystatin (MYCOSTATIN) 500000 UNITS TABS tablet Take 1 tablet by mouth. Takes every Friday usually    Historical Provider, MD  omeprazole (PRILOSEC) 40 MG capsule TAKE ONE CAPSULE BY MOUTH TWICE DAILY FOR ACID REFLUX 06/01/14   [name], MD  oxybutynin (DITROPAN) 5 MG tablet Take 1 tablet (5 mg total) by mouth daily. 12/24/14   [name], MD  potassium chloride SA (K-DUR,KLOR-CON) 20 MEQ tablet Take 1 tablet (20 mEq total) by mouth 2 (two) times daily. 12/13/14   Vicie Mutters, PA-C  pravastatin (PRAVACHOL) 40 MG tablet TAKE ONE TABLET BY MOUTH ONCE DAILY FOR CHOLESTEROL 06/01/14   [name], MD  predniSONE (DELTASONE) 10 MG tablet Take 10 mg by mouth daily. Take continuously for chest congestion 02/12/15   Historical Provider, MD  traMADol (ULTRAM) 50 MG tablet TAKE 1 TABLET FOUR TIMES DAILY FOR PAIN 04/12/15   [name], MD  valsartan (DIOVAN) 160 MG tablet Take 1 tablet (160 mg total) by mouth daily. Patient taking differently: Take 160 mg by mouth daily. Take half a tablet daily as advised by prescriber 01/16/15 01/16/16  Loma Sousa Forcucci, PA-C   BP 95/50 mmHg  Pulse 77  Temp(Src) 98.1 F (36.7 C) (Oral)  Resp 18  Ht 5\' 4"  (1.626 m)  Wt 170 lb (77.111 kg)  BMI 29.17 kg/m2  SpO2 94% Physical Exam  Constitutional: She is oriented to  person, place, and time. She appears well-developed and well-nourished. No distress.  HENT:  Head: Normocephalic and atraumatic.  Dry MM  Eyes: Conjunctivae and EOM are normal. Pupils are equal, round, and reactive to light.  Neck: Normal range of motion.  Cardiovascular: Normal rate, regular rhythm and intact distal pulses.   Pulmonary/Chest: Effort normal and breath sounds normal. No respiratory distress. She has no wheezes. She has no rales. She exhibits no tenderness.  Abdominal: Soft. Bowel sounds are normal. She exhibits no distension and no mass. There is no tenderness. There is no rebound and no guarding.  Musculoskeletal: Normal range of  motion. She exhibits no edema.  Neurological: She is alert and oriented to person, place, and time.  Skin: She is not diaphoretic.  Psychiatric: She has a normal mood and affect.  Nursing note and vitals reviewed.   ED Course  Procedures (including critical care time) Labs Review Labs Reviewed  CBC - Abnormal; Notable for the following:    WBC 12.7 (*)    RBC 5.17 (*)    Hemoglobin 16.3 (*)    HCT 48.6 (*)    All other components within normal limits  BASIC METABOLIC PANEL - Abnormal; Notable for the following:    Potassium 2.4 (*)    Chloride 94 (*)    Glucose, Bld 138 (*)    BUN 31 (*)    Creatinine, Ser 2.03 (*)    GFR calc non Af Amer 23 (*)    GFR calc Af Amer 26 (*)    All other components within normal limits  URINE CULTURE  CULTURE, BLOOD (ROUTINE X 2)  CULTURE, BLOOD (ROUTINE X 2)  PROTIME-INR  APTT  MAGNESIUM  URINALYSIS, ROUTINE W REFLEX MICROSCOPIC (NOT AT [facility])  HEPATIC FUNCTION PANEL  I-STAT CG4 LACTIC ACID, ED  POC OCCULT BLOOD, ED  POC OCCULT BLOOD, ED  Randolm Idol, ED    Imaging Review Dg Chest Port 1 View  04/16/2015   CLINICAL DATA:  Weakness and cough for 3 days.  COPD.  EXAM: PORTABLE CHEST - 1 VIEW  COMPARISON:  11/14/2014  FINDINGS: Atherosclerotic aortic arch. Heart size within normal limits.  The lungs appear clear. No pleural effusion identified.  IMPRESSION: 1. The lungs currently appear clear. 2. Atherosclerotic aortic arch.   Electronically Signed   By: Van Clines M.D.   On: 04/16/2015 19:29     EKG Interpretation None      MDM   Final diagnoses:  Hypotension  AKI (acute kidney injury)  Dehydration  Nausea vomiting and diarrhea  Hypokalemia    Filed Vitals:   04/16/15 1730 04/16/15 1836  BP: 93/48 95/50  Pulse: 72 77  Temp: 98.1 F (36.7 C)   TempSrc: Oral   Resp: 20 18  Height:  5\' 4"  (1.626 m)  Weight:  170 lb (77.111 kg)  SpO2: 93% 94%    Medications  sodium chloride 0.9 % bolus 1,000 mL (not administered)  potassium chloride SA (K-DUR,KLOR-CON) CR tablet 40 mEq (not administered)    Mathis Bud Sandefur is a pleasant 79 y.o. female presenting with  nausea vomiting diarrhea and generalized weakness. Patient is hypotensive with soft systolic in the 51O. She is not tachycardic. She is reporting a darker than normal stool however the fecal occult blood is negative. EKG shows increasing ST depressions in the lateral leads which have been chronic for her. Her troponin is negative. Blood work shows a profound hemoconcentration with a hemoglobin of 16 and a crit of 48. Her potassium is low at 2.4 but her magnesium is normal. She has an acute kidney injury with a creatinine of 2.03, previous measurements have been normal at 0.961 month ago. Patient will need admission for hydration. She has no fever however she does have a leukocytosis of 12.7. She has no signs of infection however blood and urine cultures are drawn. Chest x-rays without infiltrate.  Case discussed with Dr. Humphrey Rolls who accepts admission: States that he put in holding orders after he sees the patient.  Monico Blitz, PA-C 04/16/15 2020  Charlesetta Shanks, MD 04/16/15 [phone]

## 2015-04-16 NOTE — H&P (Signed)
[facility] History and Physical  Sheree Lalla Deskins VFI:[id number] DOB: [birth year] DOA: 04/16/2015  Referring physician: Charlesetta Shanks, MD PCP: Alesia Richards, MD   Chief Complaint: Nausea Vomiting  HPI: Marie [name] is a 79 y.o. female with history of COPD HTN GERD hyperlipidemia presents with nausea and vomiting. She states that she has been having vomiting for the last 2-3 days. She states that she has similar episodes in the past. She states that she did not have any sick exposure. She has no diarrhea presently. She does on occasion have loose stools. She denies having any fevers or chills. She has noted some difficulty voiding which is why she is on ditropan. Patient states that she also has COPD and is on advair at home. She is not on oxygen at home presently.   Review of Systems:  Constitutional:  No weight loss, night sweats, Fevers, chills, fatigue.  HEENT:  No headaches, No sneezing, itching, ear ache, nasal congestion, post nasal drip,  Cardio-vascular:  No chest pain, swelling in lower extremities  GI:  No heartburn, indigestion, abdominal pain, +nausea, +vomiting, no diarrhea  Resp:  No shortness of breath. no productive cough, No coughing up of blood. Skin:  no rash or lesions GU:  no dysuria, change in color of urine, no urgency or frequency  Musculoskeletal:  No joint pain or swelling. No decreased range of motion.  Psych:  No change in mood or affect. No depression or anxiety  Past Medical History  Diagnosis Date  . Incontinence     not indicated at this visit.  . Wears dentures   . Osteoporosis   . Bulging disc   . Hyperlipidemia   . Chronic bronchitis     due to congestion at times, on prednisone and advair  . GERD (gastroesophageal reflux disease)   . Neuropathy     legs stay numb   . Depression     many years ago  . Hypertension     followed by intern med Dr. Marisue Brooklyn [phone]  . Tubulovillous adenoma of rectum   . Balance  disorder 2008.      Falls a lot.  She can be standing and then leans too far over to one side   . Wears glasses   . Hearing loss     mild  . Iatrogenic adrenal insufficiency   . Osteoarthritis     hands,   . COPD (chronic obstructive pulmonary disease)   . Shortness of breath dyspnea    Past Surgical History  Procedure Laterality Date  . Incontinence surgery      multiple procedures, not cured  . Rectal surgery      by dr. Marlou Starks, removal of polyp  . Appendectomy    . Abdominal hysterectomy    . Tonsillectomy    . Eye surgery      bilateral cataract surgery and lens implant  . Dilation and curettage of uterus    . Rectal biopsy  09/21/2011    Procedure: BIOPSY RECTAL;  Surgeon: Merrie Roof, MD;  Location: [place];  Service: General;  Laterality: N/A;  3-4 cm  . Finger surgery      fusions and debridements for OA  . External fixation leg  10/25/2012    Procedure: EXTERNAL FIXATION LEG;  Surgeon: Rozanna Box, MD;  Location: [place];  Service: Orthopedics;  Laterality: Right;  . Knee arthroscopy with medial menisectomy Right 09/19/2014    Procedure: RIGHT KNEE ARTHROSCOPY WITH MEDIAL AND LATERAL  MENISECTOMIES. CHONDROPLASTY OF PATELLA-FEMORAL JOINT;  Surgeon: Alta Corning, MD;  Location: [place];  Service: Orthopedics;  Laterality: Right;  . Knee arthroscopy with lateral menisectomy Right 09/19/2014    Procedure: KNEE ARTHROSCOPY WITH LATERAL MENISECTOMY;  Surgeon: Alta Corning, MD;  Location: [place];  Service: Orthopedics;  Laterality: Right;  . Chondroplasty  09/19/2014    Procedure: CHONDROPLASTY;  Surgeon: Alta Corning, MD;  Location: [place];  Service: Orthopedics;;   Social History:  reports that she has never smoked. She has never used smokeless tobacco. She reports that she does not drink alcohol or use illicit drugs.  Allergies  Allergen Reactions  . Ertapenem     Other reaction(s): Redness Caused whole body to turn  red  . Codeine Other (See Comments)    hallucinations  . Demerol Other (See Comments)    hallucinations  . Morphine And Related Other (See Comments)    hallucinations  . Other Other (See Comments)    Invan 7- pt became red all over  . Sulfate Other (See Comments)    unknown    Family History  Problem Relation Age of Onset  . Cancer Sister     pt unaware of what kind  . High blood pressure Mother   . COPD Mother   . Heart attack Father      Prior to Admission medications   Medication Sig Start Date End Date Taking? Authorizing Provider  albuterol (PROVENTIL) (2.5 MG/3ML) 0.083% nebulizer solution Take 3 mLs (2.5 mg total) by nebulization every 6 (six) hours as needed for wheezing or shortness of breath. 11/21/14   [name], MD  aspirin 81 MG tablet Take 81 mg by mouth daily. Buys OTC    Historical Provider, MD  cholecalciferol (VITAMIN D) 1000 UNITS tablet Take 5 tablets (5,000 Units total) by mouth daily. 10/28/12   Radene Gunning, NP  Fluticasone-Salmeterol (ADVAIR DISKUS) 250-50 MCG/DOSE AEPB Inhale 1 puff into the lungs 2 (two) times daily. 12/17/14   [name], MD  furosemide (LASIX) 20 MG tablet Take 1 tablet daily ONLY if needed for fluid retention & swelling 03/19/15 03/18/16  [name], MD  Multiple Vitamin (MULTIVITAMIN) capsule Take 1 capsule by mouth daily.     Historical Provider, MD  nystatin (MYCOSTATIN) 500000 UNITS TABS tablet Take 1 tablet by mouth. Takes every Friday usually    Historical Provider, MD  omeprazole (PRILOSEC) 40 MG capsule TAKE ONE CAPSULE BY MOUTH TWICE DAILY FOR ACID REFLUX 06/01/14   [name], MD  oxybutynin (DITROPAN) 5 MG tablet Take 1 tablet (5 mg total) by mouth daily. 12/24/14   [name], MD  potassium chloride SA (K-DUR,KLOR-CON) 20 MEQ tablet Take 1 tablet (20 mEq total) by mouth 2 (two) times daily. 12/13/14   Vicie Mutters, PA-C  pravastatin (PRAVACHOL) 40 MG tablet TAKE ONE TABLET BY MOUTH ONCE DAILY FOR  CHOLESTEROL 06/01/14   [name], MD  predniSONE (DELTASONE) 10 MG tablet Take 10 mg by mouth daily. Take continuously for chest congestion 02/12/15   Historical Provider, MD  traMADol (ULTRAM) 50 MG tablet TAKE 1 TABLET FOUR TIMES DAILY FOR PAIN 04/12/15   [name], MD  valsartan (DIOVAN) 160 MG tablet Take 1 tablet (160 mg total) by mouth daily. Patient taking differently: Take 160 mg by mouth daily. Take half a tablet daily as advised by prescriber 01/16/15 01/16/16  Starlyn Skeans, PA-C   Physical Exam: Filed Vitals:   04/16/15 1730  04/16/15 1836 04/16/15 2011  BP: 93/48 95/50 120/48  Pulse: 72 77 77  Temp: 98.1 F (36.7 C)  99 F (37.2 C)  TempSrc: Oral  Oral  Resp: 20 18 16   Height:  5\' 4"  (1.626 m)   Weight:  77.111 kg (170 lb)   SpO2: 93% 94% 100%    Wt Readings from Last 3 Encounters:  04/16/15 77.111 kg (170 lb)  03/19/15 83.28 kg (183 lb 9.6 oz)  01/17/15 79.379 kg (175 lb)    General:  Appears calm and comfortable Eyes: PERRL, normal lids, irises & conjunctiva ENT: grossly normal hearing, lips & tongue Neck: no LAD, masses or thyromegaly Cardiovascular: RRR, no r/g. Respiratory: CTA bilaterally, no w/r/r. Normal respiratory effort. Abdomen: soft, ntnd no rebound Skin: no rash or induration seen on limited exam Musculoskeletal: grossly normal tone BUE/BLE Psychiatric: grossly normal mood and affect Neurologic: grossly non-focal.          Labs on Admission:  Basic Metabolic Panel:  Recent Labs Lab 04/16/15 1807  NA 139  K 2.4*  CL 94*  CO2 30  GLUCOSE 138*  BUN 31*  CREATININE 2.03*  CALCIUM 9.3  MG 2.3   Liver Function Tests: No results for input(s): AST, ALT, ALKPHOS, BILITOT, PROT, ALBUMIN in the last 168 hours. No results for input(s): LIPASE, AMYLASE in the last 168 hours. No results for input(s): AMMONIA in the last 168 hours. CBC:  Recent Labs Lab 04/16/15 1807  WBC 12.7*  HGB 16.3*  HCT 48.6*  MCV 94.0  PLT 313    Cardiac Enzymes: No results for input(s): CKTOTAL, CKMB, CKMBINDEX, TROPONINI in the last 168 hours.  BNP (last 3 results)  Recent Labs  11/15/14 0630 11/16/14 0605 03/15/15 1225  BNP 635.6* 485.1* 102.1*    ProBNP (last 3 results) No results for input(s): PROBNP in the last 8760 hours.  CBG: No results for input(s): GLUCAP in the last 168 hours.  Radiological Exams on Admission: Dg Chest Port 1 View  04/16/2015   CLINICAL DATA:  Weakness and cough for 3 days.  COPD.  EXAM: PORTABLE CHEST - 1 VIEW  COMPARISON:  11/14/2014  FINDINGS: Atherosclerotic aortic arch. Heart size within normal limits. The lungs appear clear. No pleural effusion identified.  IMPRESSION: 1. The lungs currently appear clear. 2. Atherosclerotic aortic arch.   Electronically Signed   By: Van Clines M.D.   On: 04/16/2015 19:29      Assessment/Plan Active Problems:   Hyperlipidemia   Hypertension   COPD   AKI (acute kidney injury)   Hypovolemia dehydration   Hypokalemia   1. Hypovolemia with Dehydration -will be started on IVF due to dehydration -hold lasix for now -will hold losartan due to hypotension and also due to increased creatinine  2. Nausea and Vomiting -PRN odansetron -hydrate with IVF as ordered  3. AKI -secondary to dehydration -holding lasix for now as well as losartan  4. Hypokalemia -will replete with IV KCL since she is vomiting  5. COPD -will continue with inhalers dulera and nebs as needed -CXR clear presently  6. Hyperlipidemia -On statins   Code Status: Full Code (must indicate code status--if unknown or must be presumed, indicate so) DVT Prophylaxis:heparin Family Communication: none (indicate person spoken with, if applicable, with phone number if by telephone) Disposition Plan: Home (indicate anticipated LOS)  Time spent: 44min  Eldonna Neuenfeldt A [facility] Pager [phone]

## 2015-04-16 NOTE — ED Notes (Signed)
Attempted to give patient K-DUR. Pt had difficulties swallowing half a pill. Remaining not given due to risk of aspiration. Wil notify provider.

## 2015-04-16 NOTE — ED Notes (Signed)
Pt aware that a urine sample is needed, pt sts she will push call bell when ready for bathroom.

## 2015-04-16 NOTE — ED Notes (Signed)
Pt complains of fatigue for the past 3 days. Pt went to her PCP today, who reccommended she come to the ED. Pt states she has been drinking fluids as usual.

## 2015-04-16 NOTE — ED Notes (Signed)
Pt unable to provide a urine sample. Will continue to monitor.

## 2015-04-17 DIAGNOSIS — N179 Acute kidney failure, unspecified: Principal | ICD-10-CM

## 2015-04-17 DIAGNOSIS — R112 Nausea with vomiting, unspecified: Secondary | ICD-10-CM

## 2015-04-17 DIAGNOSIS — E861 Hypovolemia: Secondary | ICD-10-CM

## 2015-04-17 DIAGNOSIS — E876 Hypokalemia: Secondary | ICD-10-CM

## 2015-04-17 DIAGNOSIS — E785 Hyperlipidemia, unspecified: Secondary | ICD-10-CM

## 2015-04-17 DIAGNOSIS — J449 Chronic obstructive pulmonary disease, unspecified: Secondary | ICD-10-CM

## 2015-04-17 DIAGNOSIS — I10 Essential (primary) hypertension: Secondary | ICD-10-CM

## 2015-04-17 LAB — COMPREHENSIVE METABOLIC PANEL
ALBUMIN: 3 g/dL — AB (ref 3.5–5.0)
ALK PHOS: 40 U/L (ref 38–126)
ALT: 14 U/L (ref 14–54)
AST: 25 U/L (ref 15–41)
Anion gap: 6 (ref 5–15)
BUN: 20 mg/dL (ref 6–20)
CO2: 29 mmol/L (ref 22–32)
CREATININE: 1.31 mg/dL — AB (ref 0.44–1.00)
Calcium: 7.7 mg/dL — ABNORMAL LOW (ref 8.9–10.3)
Chloride: 107 mmol/L (ref 101–111)
GFR calc Af Amer: 45 mL/min — ABNORMAL LOW (ref >60)
GFR calc non Af Amer: 38 mL/min — ABNORMAL LOW (ref >60)
Glucose, Bld: 108 mg/dL — ABNORMAL HIGH (ref 65–99)
Potassium: 2.8 mmol/L — ABNORMAL LOW (ref 3.5–5.1)
Sodium: 142 mmol/L (ref 135–145)
TOTAL PROTEIN: 5.1 g/dL — AB (ref 6.5–8.1)
Total Bilirubin: 0.7 mg/dL (ref 0.3–1.2)

## 2015-04-17 LAB — PROTIME-INR
INR: 1.06 (ref 0.00–1.49)
PROTHROMBIN TIME: 14 s (ref 11.6–15.2)

## 2015-04-17 LAB — CBC
HCT: 40.6 % (ref 36.0–46.0)
Hemoglobin: 13.3 g/dL (ref 12.0–15.0)
MCH: 30.9 pg (ref 26.0–34.0)
MCHC: 32.8 g/dL (ref 30.0–36.0)
MCV: 94.4 fL (ref 78.0–100.0)
PLATELETS: 195 10*3/uL (ref 150–400)
RBC: 4.3 MIL/uL (ref 3.87–5.11)
RDW: 13.5 % (ref 11.5–15.5)
WBC: 8.6 10*3/uL (ref 4.0–10.5)

## 2015-04-17 LAB — TSH: TSH: 0.589 u[IU]/mL (ref 0.350–4.500)

## 2015-04-17 LAB — MAGNESIUM: Magnesium: 2 mg/dL (ref 1.7–2.4)

## 2015-04-17 LAB — GLUCOSE, CAPILLARY: GLUCOSE-CAPILLARY: 103 mg/dL — AB (ref 65–99)

## 2015-04-17 MED ORDER — POTASSIUM CHLORIDE 10 MEQ/100ML IV SOLN
INTRAVENOUS | Status: AC
  Administered 2015-04-17: 10 meq via INTRAVENOUS
  Filled 2015-04-17: qty 200

## 2015-04-17 MED ORDER — POTASSIUM CHLORIDE CRYS ER 20 MEQ PO TBCR
80.0000 meq | EXTENDED_RELEASE_TABLET | Freq: Once | ORAL | Status: AC
  Administered 2015-04-17: 80 meq via ORAL
  Filled 2015-04-17: qty 4

## 2015-04-17 MED ORDER — POTASSIUM CHLORIDE 10 MEQ/100ML IV SOLN
10.0000 meq | INTRAVENOUS | Status: AC
  Administered 2015-04-17 (×5): 10 meq via INTRAVENOUS
  Filled 2015-04-17: qty 100

## 2015-04-17 NOTE — Care Management Note (Signed)
Case Management Note  Patient Details  Name: Marie [name] MRN: [id number] Date of Birth: [birth year]  Subjective/Objective:  Pt admitted with K+2.5                  Action/Plan: from home   Expected Discharge Date:   (unknown)               Expected Discharge Plan:  Home/Self Care  In-House Referral:     Discharge planning Services  CM Consult  Post Acute Care Choice:    Choice offered to:     DME Arranged:    DME Agency:     HH Arranged:    [place] Agency:     Status of Service:  In process, will continue to follow  Medicare Important Message Given:    Date Medicare IM Given:    Medicare IM give by:    Date Additional Medicare IM Given:    Additional Medicare Important Message give by:     If discussed at [place] of Stay Meetings, dates discussed:    Additional CommentsPurcell Mouton, RN 04/17/2015, 3:51 PM

## 2015-04-17 NOTE — Progress Notes (Signed)
[facility]                                                                              Patient Demographics  Marie [name], is a 79 y.o. female, DOB - 1936-08-01, SEG:[id number]  Admit date - 04/16/2015   Admitting Physician Marie Gee, MD  Outpatient Primary MD for the patient is Marie Richards, MD  LOS - 1   Chief Complaint  Patient presents with  . Hypotension      HPI on 04/16/2015 by Dr. Devona Konig Mathis Bud [name] is a 79 y.o. female with history of COPD HTN GERD hyperlipidemia presents with nausea and vomiting. She states that she has been having vomiting for the last 2-3 days. She states that she has similar episodes in the past. She states that she did not have any sick exposure. She has no diarrhea presently. She does on occasion have loose stools. She denies having any fevers or chills. She has noted some difficulty voiding which is why she is on ditropan. Patient states that she also has COPD and is on advair at home. She is not on oxygen at home presently.  Assessment & Plan   Hypovolemia with dehydration  -lasix held -Continue IVF -Per patient, this has occurred in the past  Transient hypotension -Resolved with IVF -Continue to monitor carefully -Lasix and losartan held  Nausea and vomiting -Improving -Continue antiemetics as needed -Denies abdominal pain or diarrhea, sick contacts  Acute kidney injury -Improving, possible secondary to dehydration vs medications  creatinine upon admission was 2, currently 1.31 -Continue IV fluids and monitor BMP  Hypokalemia -Likely secondary to GI losses -Continue to replace and monitor BMP -Obtain magnesium level  COPD -Currently stable, no wheezing -Continue to dulera -Continue nebs as needed -Chest x-ray negative for infection  Hyperlipidemia -Continue statin  Code Status: Full  Family Communication: None at bedside  Disposition Plan: Admitted.  Continue IVF and monitoring. PT consulted.     Time Spent in minutes   30 minutes  Procedures  None  Consults   None  DVT Prophylaxis  heparin  Lab Results  Component Value Date   PLT 195 04/17/2015    Medications  Scheduled Meds: . aspirin EC  81 mg Oral Daily  . cholecalciferol  5,000 Units Oral Daily  . folic acid  1 mg Oral Daily  . heparin  5,000 Units Subcutaneous 3 times per day  . mometasone-formoterol  2 puff Inhalation BID  . multivitamin with minerals  1 tablet Oral Daily  . oxybutynin  5 mg Oral Daily  . pantoprazole  80 mg Oral Daily  . potassium chloride  10 mEq Intravenous Q1 Hr x 5  . potassium chloride SA  80 mEq Oral Once  . pravastatin  40 mg Oral q1800  . predniSONE  10 mg Oral Daily  . sodium chloride  3 mL Intravenous Q12H  . thiamine  100 mg Oral Daily   Continuous Infusions: . sodium chloride 50 mL/hr at 04/16/15 2247   PRN Meds:.acetaminophen **OR** acetaminophen, albuterol, ondansetron **OR** ondansetron (ZOFRAN) IV, traMADol  Antibiotics    Anti-infectives    None      Subjective:  Marie [name] seen and examined today.  Patient states she is feeling better.  She states she has not taken her Lasix in the last several days. She only takes her Lasix and she has ankle swelling. Patient denies any current chest pain or shortness of breath, abdominal pain. She feels her nausea and vomiting have improved.  Objective:   Filed Vitals:   04/16/15 2253 04/17/15 0500 04/17/15 0541 04/17/15 0958  BP: 119/82  136/47   Pulse: 93  81   Temp: 99.4 F (37.4 C)  98.6 F (37 C)   TempSrc: Oral  Oral   Resp: 18  20   Height: 5\' 4"  (1.626 m)     Weight: 78.472 kg (173 lb) 80.74 kg (178 lb)    SpO2: 95%  97% 100%    Wt Readings from Last 3 Encounters:  04/17/15 80.74 kg (178 lb)  03/19/15 83.28 kg (183 lb 9.6 oz)  01/17/15 79.379 kg (175 lb)     Intake/Output Summary (Last 24 hours) at 04/17/15 1204 Last data filed at 04/17/15 0500  Gross per 24 hour  Intake 710.83 ml  Output     250 ml  Net 460.83 ml    Exam  General: Well developed, well nourished, NAD, appears stated age  HEENT: NCAT, mucous membranes moist.   Cardiovascular: S1 S2 auscultated, 2/6 SEM. Regular rate and rhythm.  Respiratory: Clear to auscultation bilaterally with equal chest rise  Abdomen: Soft, nontender, nondistended, + bowel sounds  Extremities: warm dry without cyanosis clubbing or edema  Neuro: AAOx3, nonfocal  Psych: Normal affect and demeanor with intact judgement and insight  Data Review   Micro Results No results found for this or any previous visit (from the past 240 hour(s)).  Radiology Reports Dg Chest Port 1 View  04/16/2015   CLINICAL DATA:  Weakness and cough for 3 days.  COPD.  EXAM: PORTABLE CHEST - 1 VIEW  COMPARISON:  11/14/2014  FINDINGS: Atherosclerotic aortic arch. Heart size within normal limits. The lungs appear clear. No pleural effusion identified.  IMPRESSION: 1. The lungs currently appear clear. 2. Atherosclerotic aortic arch.   Electronically Signed   By: Marie [name] M.D.   On: 04/16/2015 19:29    CBC  Recent Labs Lab 04/16/15 1807 04/17/15 0505  WBC 12.7* 8.6  HGB 16.3* 13.3  HCT 48.6* 40.6  PLT 313 195  MCV 94.0 94.4  MCH 31.5 30.9  MCHC 33.5 32.8  RDW 13.5 13.5    Chemistries   Recent Labs Lab 04/16/15 1807 04/17/15 0505  NA 139 142  K 2.4* 2.8*  CL 94* 107  CO2 30 29  GLUCOSE 138* 108*  BUN 31* 20  CREATININE 2.03* 1.31*  CALCIUM 9.3 7.7*  MG 2.3  --   AST 27 25  ALT 19 14  ALKPHOS 58 40  BILITOT 1.1 0.7   ------------------------------------------------------------------------------------------------------------------ estimated creatinine clearance is 37.5 mL/min (by C-G formula based on Cr of 1.31). ------------------------------------------------------------------------------------------------------------------ No results for input(s): HGBA1C in the last 72  hours. ------------------------------------------------------------------------------------------------------------------ No results for input(s): CHOL, HDL, LDLCALC, TRIG, CHOLHDL, LDLDIRECT in the last 72 hours. ------------------------------------------------------------------------------------------------------------------  Recent Labs  04/17/15 0456  TSH 0.589   ------------------------------------------------------------------------------------------------------------------ No results for input(s): VITAMINB12, FOLATE, FERRITIN, TIBC, IRON, RETICCTPCT in the last 72 hours.  Coagulation profile  Recent Labs Lab 04/16/15 1807 04/17/15 0505  INR 1.03 1.06    No results for input(s): DDIMER in the last 72 hours.  Cardiac Enzymes No results for input(s): CKMB, TROPONINI,  MYOGLOBIN in the last 168 hours.  Invalid input(s): CK ------------------------------------------------------------------------------------------------------------------ Invalid input(s): POCBNP    Trelon Plush D.O. on 04/17/2015 at 12:04 PM  Between 7am to 7pm - Pager - [phone]  After 7pm go to [email or web address] - password [email or web address]  And look for the night coverage person covering for me after hours  [facility] Office  [phone]

## 2015-04-18 DIAGNOSIS — E860 Dehydration: Secondary | ICD-10-CM

## 2015-04-18 DIAGNOSIS — I959 Hypotension, unspecified: Secondary | ICD-10-CM

## 2015-04-18 LAB — URINE CULTURE

## 2015-04-18 LAB — BASIC METABOLIC PANEL
ANION GAP: 7 (ref 5–15)
BUN: 15 mg/dL (ref 6–20)
CALCIUM: 8.2 mg/dL — AB (ref 8.9–10.3)
CO2: 24 mmol/L (ref 22–32)
CREATININE: 1.2 mg/dL — AB (ref 0.44–1.00)
Chloride: 111 mmol/L (ref 101–111)
GFR calc Af Amer: 50 mL/min — ABNORMAL LOW (ref >60)
GFR, EST NON AFRICAN AMERICAN: 43 mL/min — AB (ref >60)
Glucose, Bld: 125 mg/dL — ABNORMAL HIGH (ref 65–99)
Potassium: 3.2 mmol/L — ABNORMAL LOW (ref 3.5–5.1)
Sodium: 142 mmol/L (ref 135–145)

## 2015-04-18 LAB — POTASSIUM: Potassium: 3.8 mmol/L (ref 3.5–5.1)

## 2015-04-18 LAB — GLUCOSE, CAPILLARY: Glucose-Capillary: 138 mg/dL — ABNORMAL HIGH (ref 65–99)

## 2015-04-18 LAB — HEMOGLOBIN A1C
Hgb A1c MFr Bld: 6.3 % — ABNORMAL HIGH (ref 4.8–5.6)
Mean Plasma Glucose: 134 mg/dL

## 2015-04-18 MED ORDER — POTASSIUM CHLORIDE CRYS ER 20 MEQ PO TBCR
40.0000 meq | EXTENDED_RELEASE_TABLET | Freq: Once | ORAL | Status: AC
  Administered 2015-04-18: 40 meq via ORAL
  Filled 2015-04-18: qty 2

## 2015-04-18 NOTE — Care Management (Signed)
Important Message  Patient Details  Name: Marie [name] MRN: [id number] Date of Birth: Jul 23, 1936   Medicare Important Message Given:  Yes-second notification given    Shelda Altes, LCSW 04/18/2015, 3:30 PM

## 2015-04-18 NOTE — Discharge Instructions (Signed)
Schedule an appointment with Dr. Melford Aase, 1 week, hospital follow up, repeat BMP  Dehydration, Adult Dehydration is when you lose more fluids from the body than you take in. Vital organs like the kidneys, brain, and heart cannot function without a proper amount of fluids and salt. Any loss of fluids from the body can cause dehydration.  CAUSES   Vomiting.  Diarrhea.  Excessive sweating.  Excessive urine output.  Fever. SYMPTOMS  Mild dehydration  Thirst.  Dry lips.  Slightly dry mouth. Moderate dehydration  Very dry mouth.  Sunken eyes.  Skin does not bounce back quickly when lightly pinched and released.  Dark urine and decreased urine production.  Decreased tear production.  Headache. Severe dehydration  Very dry mouth.  Extreme thirst.  Rapid, weak pulse (more than 100 beats per minute at rest).  Cold hands and feet.  Not able to sweat in spite of heat and temperature.  Rapid breathing.  Blue lips.  Confusion and lethargy.  Difficulty being awakened.  Minimal urine production.  No tears. DIAGNOSIS  Your caregiver will diagnose dehydration based on your symptoms and your exam. Blood and urine tests will help confirm the diagnosis. The diagnostic evaluation should also identify the cause of dehydration. TREATMENT  Treatment of mild or moderate dehydration can often be done at home by increasing the amount of fluids that you drink. It is best to drink small amounts of fluid more often. Drinking too much at one time can make vomiting worse. Refer to the home care instructions below. Severe dehydration needs to be treated at the hospital where you will probably be given intravenous (IV) fluids that contain water and electrolytes. HOME CARE INSTRUCTIONS   Ask your caregiver about specific rehydration instructions.  Drink enough fluids to keep your urine clear or pale yellow.  Drink small amounts frequently if you have nausea and vomiting.  Eat as  you normally do.  Avoid:  Foods or drinks high in sugar.  Carbonated drinks.  Juice.  Extremely hot or cold fluids.  Drinks with caffeine.  Fatty, greasy foods.  Alcohol.  Tobacco.  Overeating.  Gelatin desserts.  Wash your hands well to avoid spreading bacteria and viruses.  Only take over-the-counter or prescription medicines for pain, discomfort, or fever as directed by your caregiver.  Ask your caregiver if you should continue all prescribed and over-the-counter medicines.  Keep all follow-up appointments with your caregiver. SEEK MEDICAL CARE IF:  You have abdominal pain and it increases or stays in one area (localizes).  You have a rash, stiff neck, or severe headache.  You are irritable, sleepy, or difficult to awaken.  You are weak, dizzy, or extremely thirsty. SEEK IMMEDIATE MEDICAL CARE IF:   You are unable to keep fluids down or you get worse despite treatment.  You have frequent episodes of vomiting or diarrhea.  You have blood or green matter (bile) in your vomit.  You have blood in your stool or your stool looks black and tarry.  You have not urinated in 6 to 8 hours, or you have only urinated a small amount of very dark urine.  You have a fever.  You faint. MAKE SURE YOU:   Understand these instructions.  Will watch your condition.  Will get help right away if you are not doing well or get worse. Document Released: 10/05/2005 Document Revised: 12/28/2011 Document Reviewed: 05/25/2011 [facility] Patient Information 2015 [place], [place]. This information is not intended to replace advice given to you by your health  care provider. Make sure you discuss any questions you have with your health care provider.

## 2015-04-18 NOTE — Discharge Summary (Signed)
Physician Discharge Summary  Marie [name] WEX:[id number] DOB: 11/21/1935 DOA: 04/16/2015  PCP: Alesia Richards, MD  Admit date: 04/16/2015 Discharge date: 04/18/2015  Time spent: 45 minutes  Recommendations for Outpatient Follow-up:  Patient will be discharged to home with home health PT.  Patient will need to follow up with primary care provider within one week of discharge.  She will need a repeat BMP at that time.  Patient should discuss resumption of lasix and valsartan with her PCP. Patient should continue medications as prescribed.  Patient should follow a heart healthy diet.   Discharge Diagnoses:  Hypovolemia with dehydration Transient hypotension  Nausea and vomiting Acute kidney injury Hypokalemia COPD Hyperlipidemia  Discharge Condition: Stable  Diet recommendation: Heart healthy  Filed Weights   04/16/15 2253 04/17/15 0500 04/18/15 0459  Weight: 78.472 kg (173 lb) 80.74 kg (178 lb) 82.01 kg (180 lb 12.8 oz)    History of present illness:  on 04/16/2015 by Dr. Devona Konig Mathis Bud Swab is a 79 y.o. female with history of COPD HTN GERD hyperlipidemia presents with nausea and vomiting. She states that she has been having vomiting for the last 2-3 days. She states that she has similar episodes in the past. She states that she did not have any sick exposure. She has no diarrhea presently. She does on occasion have loose stools. She denies having any fevers or chills. She has noted some difficulty voiding which is why she is on ditropan. Patient states that she also has COPD and is on advair at home. She is not on oxygen at home presently.  Hospital Course:  Hypovolemia with dehydration  -lasix held -Resolved with IV fluid -Per patient, this has occurred in the past -PT consulted and recommended home health  Transient hypotension -Resolved with IVF -Continue to monitor carefully -Lasix and losartan held, patient should speak with her primary care physician  regarding resumption of these medications.  Nausea and vomiting -Improving -Continue antiemetics as needed -Denies abdominal pain or diarrhea, sick contacts  Acute kidney injury -Improving, possible secondary to dehydration vs medications -creatinine upon admission was 2, currently 1.2 -Patient will need repeat BMP within 1 week of discharge.  Hypokalemia -Likely secondary to GI losses -Replaced, 3.8 upon discharge -Magnesium 2  COPD -Currently stable, no wheezing -Continue to dulera -Chest x-ray negative for infection  Hyperlipidemia -Continue statin  Procedures  None  Consults  None  Discharge Exam: Filed Vitals:   04/18/15 1330  BP: 138/69  Pulse: 84  Temp: 98.6 F (37 C)  Resp: 18   Exam  General: Well developed, well nourished, no distress  HEENT: NCAT, mucous membranes moist.   Cardiovascular: S1 S2 auscultated, 2/6 SEM. Regular rate and rhythm.  Respiratory: Clear to auscultation   Abdomen: Soft, nontender, nondistended, + bowel sounds  Extremities: warm dry without cyanosis clubbing or edema  Neuro: AAOx3, nonfocal  Psych: Normal affect and demeanor   Discharge Instructions      Discharge Instructions    Discharge instructions    Complete by:  As directed   Patient will be discharged to home with home health PT.  Patient will need to follow up with primary care provider within one week of discharge.  She will need a repeat BMP at that time.  Patient should discuss resumption of lasix and valsartan with her PCP. Patient should continue medications as prescribed.  Patient should follow a heart healthy diet.            Medication List  STOP taking these medications        furosemide 40 MG tablet  Commonly known as:  LASIX     valsartan 160 MG tablet  Commonly known as:  DIOVAN      TAKE these medications        albuterol (2.5 MG/3ML) 0.083% nebulizer solution  Commonly known as:  PROVENTIL  Take 3 mLs (2.5 mg total) by  nebulization every 6 (six) hours as needed for wheezing or shortness of breath.     aspirin 81 MG tablet  Take 81 mg by mouth daily. Buys OTC     cholecalciferol 1000 UNITS tablet  Commonly known as:  VITAMIN D  Take 5 tablets (5,000 Units total) by mouth daily.     Fluticasone-Salmeterol 250-50 MCG/DOSE Aepb  Commonly known as:  ADVAIR DISKUS  Inhale 1 puff into the lungs 2 (two) times daily.     multivitamin capsule  Take 1 capsule by mouth daily.     nystatin 100000 UNIT/ML suspension  Commonly known as:  MYCOSTATIN  Take 5 mLs by mouth 3 (three) times daily.     nystatin 500000 UNITS Tabs tablet  Commonly known as:  MYCOSTATIN  Take 1 tablet by mouth once a week. Takes every Friday usually     omeprazole 40 MG capsule  Commonly known as:  PRILOSEC  TAKE ONE CAPSULE BY MOUTH TWICE DAILY FOR ACID REFLUX     oxybutynin 5 MG tablet  Commonly known as:  DITROPAN  Take 1 tablet (5 mg total) by mouth daily.     potassium chloride SA 20 MEQ tablet  Commonly known as:  K-DUR,KLOR-CON  Take 1 tablet (20 mEq total) by mouth 2 (two) times daily.     pravastatin 40 MG tablet  Commonly known as:  PRAVACHOL  TAKE ONE TABLET BY MOUTH ONCE DAILY FOR CHOLESTEROL     predniSONE 10 MG tablet  Commonly known as:  DELTASONE  Take 10 mg by mouth daily. Take continuously for chest congestion     traMADol 50 MG tablet  Commonly known as:  ULTRAM  TAKE 1 TABLET FOUR TIMES DAILY FOR PAIN       Allergies  Allergen Reactions  . Ertapenem     Other reaction(s): Redness Caused whole body to turn red  . Codeine Other (See Comments)    hallucinations  . Demerol Other (See Comments)    hallucinations  . Morphine And Related Other (See Comments)    hallucinations  . Other Other (See Comments)    Invan 7- pt became red all over  . Sulfate Other (See Comments)    unknown   Follow-up Information    Follow up with Alesia Richards, MD. Schedule an appointment as soon as possible  for a visit in 1 week.   Specialty:  Internal Medicine   Why:  Hospital follow up   Contact information:   [address] [place] [address] [phone]        The results of significant diagnostics from this hospitalization (including imaging, microbiology, ancillary and laboratory) are listed below for reference.    Significant Diagnostic Studies: Dg Chest Port 1 View  04/16/2015   CLINICAL DATA:  Weakness and cough for 3 days.  COPD.  EXAM: PORTABLE CHEST - 1 VIEW  COMPARISON:  11/14/2014  FINDINGS: Atherosclerotic aortic arch. Heart size within normal limits. The lungs appear clear. No pleural effusion identified.  IMPRESSION: 1. The lungs currently appear clear. 2. Atherosclerotic aortic arch.  Electronically Signed   By: Van Clines M.D.   On: 04/16/2015 19:29    Microbiology: Recent Results (from the past 240 hour(s))  Blood culture (routine x 2)     Status: None (Preliminary result)   Collection Time: 04/16/15  7:29 PM  Result Value Ref Range Status   Specimen Description BLOOD LAC  Final   Special Requests BOTTLES DRAWN AEROBIC AND ANAEROBIC 3CC  Final   Culture   Final    NO GROWTH < 24 HOURS Performed at [facility]    Report Status PENDING  Incomplete  Urine culture     Status: None   Collection Time: 04/16/15 11:10 PM  Result Value Ref Range Status   Specimen Description URINE, CLEAN CATCH  Final   Special Requests NONE  Final   Culture   Final    MULTIPLE SPECIES PRESENT, SUGGEST RECOLLECTION IF CLINICALLY INDICATED Performed at [facility]    Report Status 04/18/2015 FINAL  Final     Labs: Basic Metabolic Panel:  Recent Labs Lab 04/16/15 1807 04/17/15 0456 04/17/15 0505 04/18/15 0448 04/18/15 1213  NA 139  --  142 142  --   K 2.4*  --  2.8* 3.2* 3.8  CL 94*  --  107 111  --   CO2 30  --  29 24  --   GLUCOSE 138*  --  108* 125*  --   BUN 31*  --  20 15  --   CREATININE 2.03*  --  1.31* 1.20*  --     CALCIUM 9.3  --  7.7* 8.2*  --   MG 2.3 2.0  --   --   --    Liver Function Tests:  Recent Labs Lab 04/16/15 1807 04/17/15 0505  AST 27 25  ALT 19 14  ALKPHOS 58 40  BILITOT 1.1 0.7  PROT 6.9 5.1*  ALBUMIN 4.1 3.0*   No results for input(s): LIPASE, AMYLASE in the last 168 hours. No results for input(s): AMMONIA in the last 168 hours. CBC:  Recent Labs Lab 04/16/15 1807 04/17/15 0505  WBC 12.7* 8.6  HGB 16.3* 13.3  HCT 48.6* 40.6  MCV 94.0 94.4  PLT 313 195   Cardiac Enzymes: No results for input(s): CKTOTAL, CKMB, CKMBINDEX, TROPONINI in the last 168 hours. BNP: BNP (last 3 results)  Recent Labs  11/15/14 0630 11/16/14 0605 03/15/15 1225  BNP 635.6* 485.1* 102.1*    ProBNP (last 3 results) No results for input(s): PROBNP in the last 8760 hours.  CBG:  Recent Labs Lab 04/17/15 0734 04/18/15 0807  GLUCAP 103* 138*       Signed:  Cristal Ford  [facility] 04/18/2015, 2:01 PM

## 2015-04-18 NOTE — Evaluation (Signed)
Physical Therapy Evaluation Patient Details Name: MAYOLA MCBAIN MRN: [id number] DOB: 20-Nov-1935 Today's Date: 04/18/2015   History of Present Illness  79 yo female admitted with hyperlipidemia, hypotension, N/V. hx of HTN, COPD, osteoporosis, neuropathy, OA, multiple falls.   Clinical Impression  On eval, pt required Min guard-Min assist for mobility-able to ambulate ~150 feet with use of RW. Unsteady at times requiring Min assist. Recommend HHPT and 24 hour supervision.     Follow Up Recommendations Home health PT;Supervision/Assistance - 24 hour    Equipment Recommendations  None recommended by PT    Recommendations for Other Services       Precautions / Restrictions Precautions Precautions: Fall Restrictions Weight Bearing Restrictions: No      Mobility  Bed Mobility               General bed mobility comments: pt oob in recliner  Transfers Overall transfer level: Needs assistance Equipment used: Rolling walker (2 wheeled) Transfers: Sit to/from Stand Sit to Stand: Min guard         General transfer comment: close guard for safety. VCs hand placement. Unsteady with initial standing  Ambulation/Gait Ambulation/Gait assistance: Min assist;Min guard Ambulation Distance (Feet): 150 Feet Assistive device: Rolling walker (2 wheeled)       General Gait Details: Intermittent assist needed to stabilize. Pt tolerated distance well. No LOB.   Stairs            Wheelchair Mobility    Modified Rankin (Stroke Patients Only)       Balance Overall balance assessment: History of Falls;Needs assistance         Standing balance support: Bilateral upper extremity supported;During functional activity Standing balance-Leahy Scale: Poor Standing balance comment: needs Rw                             Pertinent Vitals/Pain Pain Assessment: No/denies pain    Home Living Family/patient expects to be discharged to:: Private residence Living  Arrangements: Spouse/significant other Available Help at Discharge: Family;Available 24 hours/day Type of Home: House Home Access: Ramped entrance     Home Layout: One level Home Equipment: Walker - 2 wheels;Walker - 4 wheels;Wheelchair - manual;Bedside commode      Prior Function Level of Independence: Independent with assistive device(s)         Comments: Uses RW all the time. Has one for inside the home and one she keeps in her car for the community.      Hand Dominance        Extremity/Trunk Assessment   Upper Extremity Assessment: Generalized weakness           Lower Extremity Assessment: Generalized weakness      Cervical / Trunk Assessment: Normal  Communication   Communication: No difficulties  Cognition Arousal/Alertness: Awake/alert Behavior During Therapy: WFL for tasks assessed/performed Overall Cognitive Status: Within Functional Limits for tasks assessed                      General Comments      Exercises        Assessment/Plan    PT Assessment All further PT needs can be met in the next venue of care (HHPT)  PT Diagnosis Difficulty walking;Generalized weakness   PT Problem List Decreased balance;Decreased mobility;Decreased strength  PT Treatment Interventions     PT Goals (Current goals can be found in the Care Plan section) Acute Rehab PT Goals Patient  Stated Goal: home today PT Goal Formulation: All assessment and education complete, DC therapy    Frequency     Barriers to discharge        Co-evaluation               End of Session Equipment Utilized During Treatment: Gait belt Activity Tolerance: Patient tolerated treatment well Patient left: in chair;with call bell/phone within reach;with family/visitor present           Time: 4604-7998 PT Time Calculation (min) (ACUTE ONLY): 14 min   Charges:   PT Evaluation $Initial PT Evaluation Tier I: 1 Procedure     PT G Codes:        Weston Anna,  MPT Pager: [phone]

## 2015-04-18 NOTE — Progress Notes (Signed)
Pt selected [facility] for HHPT/referral given to [facility].

## 2015-04-21 DIAGNOSIS — G629 Polyneuropathy, unspecified: Secondary | ICD-10-CM | POA: Diagnosis not present

## 2015-04-21 DIAGNOSIS — J449 Chronic obstructive pulmonary disease, unspecified: Secondary | ICD-10-CM | POA: Diagnosis not present

## 2015-04-21 DIAGNOSIS — I10 Essential (primary) hypertension: Secondary | ICD-10-CM | POA: Diagnosis not present

## 2015-04-21 DIAGNOSIS — Z9181 History of falling: Secondary | ICD-10-CM | POA: Diagnosis not present

## 2015-04-21 DIAGNOSIS — M1990 Unspecified osteoarthritis, unspecified site: Secondary | ICD-10-CM | POA: Diagnosis not present

## 2015-04-21 LAB — CULTURE, BLOOD (ROUTINE X 2): Culture: NO GROWTH

## 2015-04-25 ENCOUNTER — Ambulatory Visit (INDEPENDENT_AMBULATORY_CARE_PROVIDER_SITE_OTHER): Payer: Medicare Other | Admitting: Internal Medicine

## 2015-04-25 ENCOUNTER — Encounter: Payer: Self-pay | Admitting: Internal Medicine

## 2015-04-25 VITALS — BP 122/68 | HR 76 | Temp 97.5°F | Resp 16 | Ht 63.5 in | Wt 179.0 lb

## 2015-04-25 DIAGNOSIS — Z79899 Other long term (current) drug therapy: Secondary | ICD-10-CM | POA: Diagnosis not present

## 2015-04-25 DIAGNOSIS — E876 Hypokalemia: Secondary | ICD-10-CM

## 2015-04-25 DIAGNOSIS — I10 Essential (primary) hypertension: Secondary | ICD-10-CM | POA: Diagnosis not present

## 2015-04-25 NOTE — Progress Notes (Signed)
Subjective:    Patient ID: Marie [name], female    DOB: 01/22/1936, 79 y.o.   MRN: [id number]  HPI Patient presents for 1 week post hospital f/u of 2 day hospitalization for hypotension, hypokalemia and dehydration from 1 week prodrome of refractory N/V & Diarrhea. Initial BUN/Creat were elevated and K was 2.4. Hospital ,w/u was negative and patient was rehydrated and K+ corrected and sx's resolved and pat was discharged with instructions to d/c her Lasix & Diovan. It is unclear whether she and her husband understood to stop those meds, but nonetheless - her BP is WNL today. Denies GI, CV or Resp sx's today.   Medication Sig  . albuterol 2.5 MG/3ML neb solution Take 3 mLs (2.5 mg total) by nebulization every 6 (six) hours as needed for wheezing or shortness of breath.  Marland Kitchen aspirin 81 MG tablet Take 81 mg by mouth daily. Buys OTC  . VITAMIN D 1000 UNITS  Take 5 tablets (5,000 Units total) by mouth daily.  Marland Kitchen ADVAIR DISKUS 250-50  AEPB Inhale 1 puff into the lungs 2 (two) times daily.  . Multiple Vitamin  Take 1 capsule by mouth daily.   Marland Kitchen omeprazole 40 MG capsule TAKE ONE CAPSULE BY MOUTH TWICE DAILY FOR ACID REFLUX  . oxybutynin 5 MG tablet Take 1 tablet (5 mg total) by mouth daily.  . pravastatin  40 MG tablet TAKE ONE TABLET BY MOUTH ONCE DAILY FOR CHOLESTEROL  . predniSONE (DELTASONE) 10 MG tablet Take 10 mg by mouth daily.   . traMADol (ULTRAM) 50 MG tablet TAKE 1 TABLET FOUR TIMES DAILY FOR PAIN  . KCl  (K-DUR) 20 MEQ tablet Take 1 tablet (20 mEq total) by mouth 2 (two) times daily.   Allergies  Allergen Reactions  . Ertapenem     Other reaction(s): Redness Caused whole body to turn red  . Codeine Other (See Comments)    hallucinations  . Demerol Other (See Comments)    hallucinations  . Morphine And Related Other (See Comments)    hallucinations  . Other Other (See Comments)    Invan 7- pt became red all over  . Sulfate Other (See Comments)    unknown   Past Medical History   Diagnosis Date  . Incontinence     not indicated at this visit.  . Wears dentures   . Osteoporosis   . Bulging disc   . Hyperlipidemia   . Chronic bronchitis     due to congestion at times, on prednisone and advair  . GERD (gastroesophageal reflux disease)   . Neuropathy     legs stay numb   . Depression     many years ago  . Hypertension     followed by intern med Dr. Marisue Brooklyn [phone]  . Tubulovillous adenoma of rectum   . Balance disorder 2008.      Falls a lot.  She can be standing and then leans too far over to one side   . Wears glasses   . Hearing loss     mild  . Iatrogenic adrenal insufficiency   . Osteoarthritis     hands,   . COPD (chronic obstructive pulmonary disease)   . Shortness of breath dyspnea    Past Surgical History  Procedure Laterality Date  . Incontinence surgery      multiple procedures, not cured  . Rectal surgery      by dr. Marlou Starks, removal of polyp  . Appendectomy    . Abdominal  hysterectomy    . Tonsillectomy    . Eye surgery      bilateral cataract surgery and lens implant  . Dilation and curettage of uterus    . Rectal biopsy  09/21/2011    Procedure: BIOPSY RECTAL;  Surgeon: Merrie Roof, MD;  Location: [place];  Service: General;  Laterality: N/A;  3-4 cm  . Finger surgery      fusions and debridements for OA  . External fixation leg  10/25/2012    Procedure: EXTERNAL FIXATION LEG;  Surgeon: Rozanna Box, MD;  Location: [place];  Service: Orthopedics;  Laterality: Right;  . Knee arthroscopy with medial menisectomy Right 09/19/2014    Procedure: RIGHT KNEE ARTHROSCOPY WITH MEDIAL AND LATERAL MENISECTOMIES. CHONDROPLASTY OF PATELLA-FEMORAL JOINT;  Surgeon: Alta Corning, MD;  Location: [place];  Service: Orthopedics;  Laterality: Right;  . Knee arthroscopy with lateral menisectomy Right 09/19/2014    Procedure: KNEE ARTHROSCOPY WITH LATERAL MENISECTOMY;  Surgeon: Alta Corning, MD;  Location: [place];   Service: Orthopedics;  Laterality: Right;  . Chondroplasty  09/19/2014    Procedure: CHONDROPLASTY;  Surgeon: Alta Corning, MD;  Location: [place];  Service: Orthopedics;;   Review of Systems  In addition to the HPI above,  No Fever-chills,  No Headache, No changes with Vision or hearing,  No problems swallowing food or Liquids,  No Chest pain or productive Cough or Shortness of Breath,  No Abdominal pain, No Nausea or Vomitting, Bowel movements are regular,  No Blood in stool or Urine,  No dysuria,  No new skin rashes or bruises,  No new joints pains-aches,  No new weakness, tingling, numbness in any extremity,  No recent weight loss,  No polyuria, polydypsia or polyphagia,  No significant Mental Stressors.  A full 10 point Review of Systems was done, except as stated above, all other Review of Systems were negative    Objective:   Physical Exam BP 122/68 mmHg  Pulse 76  Temp(Src) 97.5 F (36.4 C)  Resp 16  Ht 5' 3.5" (1.613 m)  Wt 179 lb (81.194 kg)  BMI 31.21 kg/m2  Skin - Clear w/o rash , cyanosis, icvterus, or pallor. HEENT - Eac's patent. TM's Nl. EOM's full. PERRLA. NasoOroPharynx clear. Neck - supple. Nl Thyroid. Carotids 2+ & No bruits, nodes, JVD Chest - Clear equal BS w/o Rales, rhonchi, wheezes. Cor - Nl HS. RRR w/o sig MGR. PP 1(+). No edema. Abd - No palpable organomegaly, masses or tenderness. BS nl. MS- FROM w/o deformities. Muscle power, tone and bulk Nl. Gait Nl. Neuro - No obvious Cr N abnormalities. Sensory, motor and Cerebellar functions appear Nl w/o focal abnormalities. Psyche - Mental status normal & appropriate.  No delusions, ideations or obvious mood abnormalities.    Assessment & Plan:   1. Essential hypertension   2. Hypokalemia   3. Medication management  - BASIC METABOLIC PANEL WITH GFR

## 2015-04-25 NOTE — Patient Instructions (Signed)
  Stop Lasix (Furosemide) & Potassium Chloride (KCl) for now  Please monitor BP's 2-3 x /week & call if over 160/95

## 2015-04-26 LAB — BASIC METABOLIC PANEL WITH GFR
BUN: 25 mg/dL — ABNORMAL HIGH (ref 6–23)
CO2: 33 mEq/L — ABNORMAL HIGH (ref 19–32)
Calcium: 9.6 mg/dL (ref 8.4–10.5)
Chloride: 101 mEq/L (ref 96–112)
Creat: 1.21 mg/dL — ABNORMAL HIGH (ref 0.50–1.10)
GFR, EST AFRICAN AMERICAN: 50 mL/min — AB
GFR, EST NON AFRICAN AMERICAN: 44 mL/min — AB
Glucose, Bld: 90 mg/dL (ref 70–99)
POTASSIUM: 4.5 meq/L (ref 3.5–5.3)
Sodium: 144 mEq/L (ref 135–145)

## 2015-04-30 DIAGNOSIS — L97521 Non-pressure chronic ulcer of other part of left foot limited to breakdown of skin: Secondary | ICD-10-CM | POA: Diagnosis not present

## 2015-05-08 DIAGNOSIS — S6992XA Unspecified injury of left wrist, hand and finger(s), initial encounter: Secondary | ICD-10-CM | POA: Diagnosis not present

## 2015-05-09 DIAGNOSIS — L981 Factitial dermatitis: Secondary | ICD-10-CM | POA: Diagnosis not present

## 2015-05-09 DIAGNOSIS — Z85828 Personal history of other malignant neoplasm of skin: Secondary | ICD-10-CM | POA: Diagnosis not present

## 2015-05-09 DIAGNOSIS — M25561 Pain in right knee: Secondary | ICD-10-CM | POA: Diagnosis not present

## 2015-05-10 ENCOUNTER — Other Ambulatory Visit: Payer: Self-pay | Admitting: Internal Medicine

## 2015-05-10 ENCOUNTER — Ambulatory Visit (HOSPITAL_COMMUNITY)
Admission: RE | Admit: 2015-05-10 | Discharge: 2015-05-10 | Disposition: A | Discharge status: Home / Self Care | Payer: Medicare Other | Source: Ambulatory Visit | Attending: Internal Medicine | Admitting: Internal Medicine

## 2015-05-10 DIAGNOSIS — Z1231 Encounter for screening mammogram for malignant neoplasm of breast: Secondary | ICD-10-CM

## 2015-05-20 DIAGNOSIS — M25561 Pain in right knee: Secondary | ICD-10-CM | POA: Diagnosis not present

## 2015-05-20 DIAGNOSIS — M1711 Unilateral primary osteoarthritis, right knee: Secondary | ICD-10-CM | POA: Diagnosis not present

## 2015-05-24 ENCOUNTER — Telehealth: Payer: Self-pay

## 2015-05-24 ENCOUNTER — Other Ambulatory Visit: Payer: Self-pay | Admitting: Internal Medicine

## 2015-05-24 DIAGNOSIS — G2581 Restless legs syndrome: Secondary | ICD-10-CM

## 2015-05-24 MED ORDER — ROPINIROLE HCL 1 MG PO TABS: ORAL_TABLET | ORAL | Status: DC

## 2015-05-24 NOTE — Telephone Encounter (Signed)
Patient called requesting Rx for restless legs. Rx was sent to [facility]. Patient aware.

## 2015-05-31 DIAGNOSIS — D2211 Melanocytic nevi of right eyelid, including canthus: Secondary | ICD-10-CM | POA: Diagnosis not present

## 2015-05-31 DIAGNOSIS — L905 Scar conditions and fibrosis of skin: Secondary | ICD-10-CM | POA: Diagnosis not present

## 2015-05-31 DIAGNOSIS — D485 Neoplasm of uncertain behavior of skin: Secondary | ICD-10-CM | POA: Diagnosis not present

## 2015-05-31 DIAGNOSIS — D2239 Melanocytic nevi of other parts of face: Secondary | ICD-10-CM | POA: Diagnosis not present

## 2015-06-11 ENCOUNTER — Other Ambulatory Visit: Payer: Self-pay | Admitting: Internal Medicine

## 2015-06-19 DIAGNOSIS — L821 Other seborrheic keratosis: Secondary | ICD-10-CM | POA: Diagnosis not present

## 2015-06-19 DIAGNOSIS — Z85828 Personal history of other malignant neoplasm of skin: Secondary | ICD-10-CM | POA: Diagnosis not present

## 2015-06-19 DIAGNOSIS — D1801 Hemangioma of skin and subcutaneous tissue: Secondary | ICD-10-CM | POA: Diagnosis not present

## 2015-06-21 ENCOUNTER — Encounter: Payer: Self-pay | Admitting: Internal Medicine

## 2015-06-21 ENCOUNTER — Ambulatory Visit (INDEPENDENT_AMBULATORY_CARE_PROVIDER_SITE_OTHER): Payer: Medicare Other | Admitting: Internal Medicine

## 2015-06-21 VITALS — BP 122/64 | HR 88 | Temp 98.2°F | Resp 18 | Ht 63.5 in | Wt 178.0 lb

## 2015-06-21 DIAGNOSIS — I10 Essential (primary) hypertension: Secondary | ICD-10-CM

## 2015-06-21 DIAGNOSIS — E559 Vitamin D deficiency, unspecified: Secondary | ICD-10-CM | POA: Diagnosis not present

## 2015-06-21 DIAGNOSIS — E785 Hyperlipidemia, unspecified: Secondary | ICD-10-CM

## 2015-06-21 DIAGNOSIS — Z79899 Other long term (current) drug therapy: Secondary | ICD-10-CM | POA: Diagnosis not present

## 2015-06-21 DIAGNOSIS — R7309 Other abnormal glucose: Secondary | ICD-10-CM

## 2015-06-21 DIAGNOSIS — R7303 Prediabetes: Secondary | ICD-10-CM

## 2015-06-21 LAB — CBC WITH DIFFERENTIAL/PLATELET
Basophils Absolute: 0.1 10*3/uL (ref 0.0–0.1)
Basophils Relative: 1 % (ref 0–1)
EOS PCT: 2 % (ref 0–5)
Eosinophils Absolute: 0.2 10*3/uL (ref 0.0–0.7)
HCT: 43.6 % (ref 36.0–46.0)
HEMOGLOBIN: 14.7 g/dL (ref 12.0–15.0)
LYMPHS ABS: 1.1 10*3/uL (ref 0.7–4.0)
Lymphocytes Relative: 14 % (ref 12–46)
MCH: 31.3 pg (ref 26.0–34.0)
MCHC: 33.7 g/dL (ref 30.0–36.0)
MCV: 92.8 fL (ref 78.0–100.0)
MPV: 10.1 fL (ref 8.6–12.4)
Monocytes Absolute: 0.5 10*3/uL (ref 0.1–1.0)
Monocytes Relative: 6 % (ref 3–12)
NEUTROS PCT: 77 % (ref 43–77)
Neutro Abs: 5.9 10*3/uL (ref 1.7–7.7)
Platelets: 269 10*3/uL (ref 150–400)
RBC: 4.7 MIL/uL (ref 3.87–5.11)
RDW: 13.4 % (ref 11.5–15.5)
WBC: 7.6 10*3/uL (ref 4.0–10.5)

## 2015-06-21 LAB — HEPATIC FUNCTION PANEL
ALBUMIN: 3.6 g/dL (ref 3.6–5.1)
ALK PHOS: 48 U/L (ref 33–130)
ALT: 16 U/L (ref 6–29)
AST: 15 U/L (ref 10–35)
BILIRUBIN INDIRECT: 0.5 mg/dL (ref 0.2–1.2)
Bilirubin, Direct: 0.1 mg/dL (ref <=0.2)
TOTAL PROTEIN: 5.6 g/dL — AB (ref 6.1–8.1)
Total Bilirubin: 0.6 mg/dL (ref 0.2–1.2)

## 2015-06-21 LAB — LIPID PANEL
Cholesterol: 145 mg/dL (ref 125–200)
HDL: 69 mg/dL (ref >=46)
LDL CALC: 53 mg/dL (ref <130)
Total CHOL/HDL Ratio: 2.1 Ratio (ref <=5.0)
Triglycerides: 115 mg/dL (ref <150)
VLDL: 23 mg/dL (ref <30)

## 2015-06-21 LAB — TSH: TSH: 1.033 u[IU]/mL (ref 0.350–4.500)

## 2015-06-21 LAB — BASIC METABOLIC PANEL WITH GFR
BUN: 23 mg/dL (ref 7–25)
CHLORIDE: 101 mmol/L (ref 98–110)
CO2: 31 mmol/L (ref 20–31)
Calcium: 8.9 mg/dL (ref 8.6–10.4)
Creat: 1.03 mg/dL — ABNORMAL HIGH (ref 0.60–0.93)
GFR, Est African American: 61 mL/min (ref >=60)
GFR, Est Non African American: 53 mL/min — ABNORMAL LOW (ref >=60)
GLUCOSE: 103 mg/dL — AB (ref 65–99)
POTASSIUM: 3.4 mmol/L — AB (ref 3.5–5.3)
Sodium: 146 mmol/L (ref 135–146)

## 2015-06-21 LAB — HEMOGLOBIN A1C
HEMOGLOBIN A1C: 5.9 % — AB (ref <5.7)
Mean Plasma Glucose: 123 mg/dL — ABNORMAL HIGH (ref <117)

## 2015-06-21 LAB — MAGNESIUM: Magnesium: 2 mg/dL (ref 1.5–2.5)

## 2015-06-21 NOTE — Patient Instructions (Signed)
Please increase the amount of water that you are drinking.  You want to try to get in 4-5 water bottles per day.    USE YOUR WALKER EVERYWHERE!!!! NO FALLS PLEASE!!!!!

## 2015-06-21 NOTE — Progress Notes (Signed)
Patient ID: Marie [name], female   DOB: 07/20/1936, 79 y.o.   MRN: [id number]  Assessment and Plan:  Hypertension:  -Continue medication,  -monitor blood pressure at home.  -Continue DASH diet.   -Reminder to go to the ER if any CP, SOB, nausea, dizziness, severe HA, changes vision/speech, left arm numbness and tingling, and jaw pain.  Cholesterol: -Continue diet and exercise.  -Check cholesterol.   Pre-diabetes: -Continue diet and exercise.  -Check A1C  Vitamin D Def: -check level -continue medications.   Continue diet and meds as discussed. Further disposition pending results of labs.  HPI 79 y.o. female  presents for 3 month follow up with hypertension, hyperlipidemia, prediabetes and vitamin D.   Her blood pressure has been controlled at home, today their BP is BP: 122/64 mmHg.   She does workout. She denies chest pain, shortness of breath, dizziness.   She is on cholesterol medication and denies myalgias. Her cholesterol is at goal. The cholesterol last visit was:   Lab Results  Component Value Date   CHOL 154 03/19/2015   HDL 63 03/19/2015   LDLCALC 58 03/19/2015   TRIG 165* 03/19/2015   CHOLHDL 2.4 03/19/2015     She has been working on diet and exercise for prediabetes, and denies foot ulcerations, hyperglycemia, hypoglycemia , increased appetite, nausea, paresthesia of the feet, polydipsia, polyuria, visual disturbances, vomiting and weight loss. Last A1C in the office was:  Lab Results  Component Value Date   HGBA1C 6.3* 04/17/2015    Patient is on Vitamin D supplement.  Lab Results  Component Value Date   VD25OH 35 03/19/2015      Current Medications:  Current Outpatient Prescriptions on File Prior to Visit  Medication Sig Dispense Refill  . albuterol (PROVENTIL) (2.5 MG/3ML) 0.083% nebulizer solution Take 3 mLs (2.5 mg total) by nebulization every 6 (six) hours as needed for wheezing or shortness of breath. 75 mL 4  . aspirin 81 MG tablet Take 81 mg  by mouth daily. Buys OTC    . cholecalciferol (VITAMIN D) 1000 UNITS tablet Take 5 tablets (5,000 Units total) by mouth daily.    . Fluticasone-Salmeterol (ADVAIR DISKUS) 250-50 MCG/DOSE AEPB Inhale 1 puff into the lungs 2 (two) times daily. 60 each 99  . Multiple Vitamin (MULTIVITAMIN) capsule Take 1 capsule by mouth daily.     Marland Kitchen nystatin (MYCOSTATIN) 100000 UNIT/ML suspension Take 5 mLs by mouth 3 (three) times daily.     Marland Kitchen nystatin (MYCOSTATIN) 500000 UNITS TABS tablet Take 1 tablet by mouth once a week. Takes every Friday usually    . omeprazole (PRILOSEC) 40 MG capsule TAKE 1 CAPSULE TWICE DAILY  FOR  ACID  REFLUX 180 capsule 11  . oxybutynin (DITROPAN) 5 MG tablet Take 1 tablet (5 mg total) by mouth daily. 90 tablet 4  . pravastatin (PRAVACHOL) 40 MG tablet TAKE 1 TABLET EVERY DAY  FOR  CHOLESTEROL 90 tablet 11  . predniSONE (DELTASONE) 10 MG tablet TAKE 1 TABLET EVERY DAY WITH BREAKFAST 90 tablet 2  . rOPINIRole (REQUIP) 1 MG tablet Take 1/2 to 1 tablet 2 to 3 x daily if needed for Restless Legs 90 tablet 5  . traMADol (ULTRAM) 50 MG tablet TAKE 1 TABLET FOUR TIMES DAILY FOR PAIN 360 tablet 1   No current facility-administered medications on file prior to visit.    Medical History:  Past Medical History  Diagnosis Date  . Incontinence     not indicated at this visit.  Marland Kitchen  Wears dentures   . Osteoporosis   . Bulging disc   . Hyperlipidemia   . Chronic bronchitis     due to congestion at times, on prednisone and advair  . GERD (gastroesophageal reflux disease)   . Neuropathy     legs stay numb   . Depression     many years ago  . Hypertension     followed by intern med Dr. Marisue Brooklyn [phone]  . Tubulovillous adenoma of rectum   . Balance disorder 2008.      Falls a lot.  She can be standing and then leans too far over to one side   . Wears glasses   . Hearing loss     mild  . Iatrogenic adrenal insufficiency   . Osteoarthritis     hands,   . COPD (chronic obstructive  pulmonary disease)   . Shortness of breath dyspnea     Allergies:  Allergies  Allergen Reactions  . Ertapenem     Other reaction(s): Redness Caused whole body to turn red  . Codeine Other (See Comments)    hallucinations  . Demerol Other (See Comments)    hallucinations  . Morphine And Related Other (See Comments)    hallucinations  . Other Other (See Comments)    Invan 7- pt became red all over  . Sulfate Other (See Comments)    unknown     Review of Systems:  Review of Systems  Constitutional: Negative for fever, chills and malaise/fatigue.  HENT: Negative for congestion, ear pain and sore throat.   Respiratory: Negative for cough, shortness of breath and wheezing.   Cardiovascular: Positive for leg swelling (leg swelling which does not go away over night). Negative for chest pain and palpitations.  Gastrointestinal: Negative for heartburn, diarrhea, constipation, blood in stool and melena.  Genitourinary: Negative for dysuria, urgency, frequency and hematuria.  Neurological: Negative for dizziness, sensory change, loss of consciousness and headaches.  Psychiatric/Behavioral: Negative for depression. The patient is not nervous/anxious and does not have insomnia.     Family history- Review and unchanged  Social history- Review and unchanged  Physical Exam: BP 122/64 mmHg  Pulse 88  Temp(Src) 98.2 F (36.8 C) (Temporal)  Resp 18  Ht 5' 3.5" (1.613 m)  Wt 178 lb (80.74 kg)  BMI 31.03 kg/m2 Wt Readings from Last 3 Encounters:  06/21/15 178 lb (80.74 kg)  04/25/15 179 lb (81.194 kg)  04/18/15 180 lb 12.8 oz (82.01 kg)    General Appearance: Well nourished well developed, in no apparent distress. Eyes: PERRLA, EOMs, conjunctiva no swelling or erythema ENT/Mouth: Ear canals normal without obstruction, swelling, erythma, discharge.  TMs normal bilaterally.  Oropharynx moist, clear, without exudate, or postoropharyngeal swelling. Neck: Supple, thyroid normal,no  cervical adenopathy  Respiratory: Respiratory effort normal, Breath sounds clear A&P without rhonchi, wheeze, or rale.  No retractions, no accessory usage. Cardio: RRR with no MRGs. Brisk peripheral pulses without edema.  Abdomen: Soft, + BS,  Non tender, no guarding, rebound, hernias, masses. Musculoskeletal: Full ROM, 5/5 strength, very unstable gait without walker today.   Skin: Warm, dry without rashes, lesions, ecchymosis.  Neuro: Awake and oriented X 3, Cranial nerves intact. Normal muscle tone, no cerebellar symptoms. Psych: Normal affect, Insight and Judgment appropriate.    Starlyn Skeans, PA-C 10:20 AM [facility]

## 2015-06-22 LAB — INSULIN, RANDOM: INSULIN: 6.3 u[IU]/mL (ref 2.0–19.6)

## 2015-06-22 LAB — VITAMIN D 25 HYDROXY (VIT D DEFICIENCY, FRACTURES): VIT D 25 HYDROXY: 38 ng/mL (ref 30–100)

## 2015-06-27 DIAGNOSIS — M1711 Unilateral primary osteoarthritis, right knee: Secondary | ICD-10-CM | POA: Diagnosis not present

## 2015-07-23 DIAGNOSIS — M2042 Other hammer toe(s) (acquired), left foot: Secondary | ICD-10-CM | POA: Diagnosis not present

## 2015-07-23 DIAGNOSIS — L110 Acquired keratosis follicularis: Secondary | ICD-10-CM | POA: Diagnosis not present

## 2015-07-23 DIAGNOSIS — M79675 Pain in left toe(s): Secondary | ICD-10-CM | POA: Diagnosis not present

## 2015-07-24 ENCOUNTER — Encounter: Payer: Self-pay | Admitting: Physician Assistant

## 2015-07-24 ENCOUNTER — Ambulatory Visit (INDEPENDENT_AMBULATORY_CARE_PROVIDER_SITE_OTHER): Payer: Medicare Other | Admitting: Physician Assistant

## 2015-07-24 VITALS — BP 124/70 | HR 85 | Temp 98.1°F | Resp 14 | Ht 63.5 in | Wt 176.0 lb

## 2015-07-24 DIAGNOSIS — R350 Frequency of micturition: Secondary | ICD-10-CM | POA: Diagnosis not present

## 2015-07-24 DIAGNOSIS — Z23 Encounter for immunization: Secondary | ICD-10-CM

## 2015-07-24 NOTE — Progress Notes (Signed)
HPI: Has history of recurrent UTI in the past, has strong odor in her urine x 3-4 weeks.  Has incontinence, urgency- Denies dysuria, fever, chills, frequency.  denies hematuria, flank pain or fever   Current Outpatient Prescriptions on File Prior to Visit  Medication Sig Dispense Refill  . albuterol (PROVENTIL) (2.5 MG/3ML) 0.083% nebulizer solution Take 3 mLs (2.5 mg total) by nebulization every 6 (six) hours as needed for wheezing or shortness of breath. 75 mL 4  . aspirin 81 MG tablet Take 81 mg by mouth daily. Buys OTC    . cholecalciferol (VITAMIN D) 1000 UNITS tablet Take 5 tablets (5,000 Units total) by mouth daily.    . Fluticasone-Salmeterol (ADVAIR DISKUS) 250-50 MCG/DOSE AEPB Inhale 1 puff into the lungs 2 (two) times daily. 60 each 99  . Multiple Vitamin (MULTIVITAMIN) capsule Take 1 capsule by mouth daily.     Marland Kitchen nystatin (MYCOSTATIN) 100000 UNIT/ML suspension Take 5 mLs by mouth 3 (three) times daily.     Marland Kitchen nystatin (MYCOSTATIN) 500000 UNITS TABS tablet Take 1 tablet by mouth once a week. Takes every Friday usually    . omeprazole (PRILOSEC) 40 MG capsule TAKE 1 CAPSULE TWICE DAILY  FOR  ACID  REFLUX 180 capsule 11  . oxybutynin (DITROPAN) 5 MG tablet Take 1 tablet (5 mg total) by mouth daily. 90 tablet 4  . pravastatin (PRAVACHOL) 40 MG tablet TAKE 1 TABLET EVERY DAY  FOR  CHOLESTEROL 90 tablet 11  . predniSONE (DELTASONE) 10 MG tablet TAKE 1 TABLET EVERY DAY WITH BREAKFAST 90 tablet 2  . rOPINIRole (REQUIP) 1 MG tablet Take 1/2 to 1 tablet 2 to 3 x daily if needed for Restless Legs 90 tablet 5  . traMADol (ULTRAM) 50 MG tablet TAKE 1 TABLET FOUR TIMES DAILY FOR PAIN 360 tablet 1   No current facility-administered medications on file prior to visit.    Past Medical History  Diagnosis Date  . Incontinence     not indicated at this visit.  . Wears dentures   . Osteoporosis   . Bulging disc   . Hyperlipidemia   . Chronic bronchitis ([place])     due to congestion at times, on  prednisone and advair  . GERD (gastroesophageal reflux disease)   . Neuropathy ([facility])     legs stay numb   . Depression     many years ago  . Hypertension     followed by intern med Dr. Marisue Brooklyn [phone]  . Tubulovillous adenoma of rectum   . Balance disorder 2008.      Falls a lot.  She can be standing and then leans too far over to one side   . Wears glasses   . Hearing loss     mild  . Iatrogenic adrenal insufficiency ([place])   . Osteoarthritis     hands,   . COPD (chronic obstructive pulmonary disease) ([place])   . Shortness of breath dyspnea     ROS:  Gen.: No unexpected weight change, no night sweats Lungs: No cough, + SOB unchanged Cardiovascular: No palpitations or chest pain  PE: BP 124/70 mmHg  Pulse 85  Temp(Src) 98.1 F (36.7 C) (Temporal)  Resp 14  Ht 5' 3.5" (1.613 m)  Wt 176 lb (79.833 kg)  BMI 30.68 kg/m2  SpO2 94% General: No acute distress Lungs: Clear to auscultation Cardiovascular: Regular rate rhythm with systolic murmur, no edema Abdomen: Mild to moderate discomfort of her suprapubic region, no flank tenderness to palpation  Lab  Results  Component Value Date   WBC 7.6 06/21/2015   HGB 14.7 06/21/2015   HCT 43.6 06/21/2015   PLT 269 06/21/2015   GLUCOSE 103* 06/21/2015   CHOL 145 06/21/2015   TRIG 115 06/21/2015   HDL 69 06/21/2015   LDLCALC 53 06/21/2015   ALT 16 06/21/2015   AST 15 06/21/2015   NA 146 06/21/2015   K 3.4* 06/21/2015   CL 101 06/21/2015   CREATININE 1.03* 06/21/2015   BUN 23 06/21/2015   CO2 31 06/21/2015   TSH 1.033 06/21/2015   INR 1.06 04/17/2015   HGBA1C 5.9* 06/21/2015   MICROALBUR 0.3 08/23/2014    Assessment/Plan: Recurrent UTI symptoms, will check urine and if + will send in ABX.   Urine culture for identification and sensitivities Hydration recommended education provided

## 2015-07-25 LAB — URINALYSIS, MICROSCOPIC ONLY
CASTS: NONE SEEN [LPF]
Crystals: NONE SEEN [HPF]
SQUAMOUS EPITHELIAL / LPF: NONE SEEN [HPF] (ref <=5)
Yeast: NONE SEEN [HPF]

## 2015-07-25 LAB — URINALYSIS, ROUTINE W REFLEX MICROSCOPIC
Bilirubin Urine: NEGATIVE
Glucose, UA: NEGATIVE
Ketones, ur: NEGATIVE
NITRITE: NEGATIVE
PH: 6.5 (ref 5.0–8.0)
Protein, ur: NEGATIVE
Specific Gravity, Urine: 1.011 (ref 1.001–1.035)

## 2015-07-29 LAB — URINE CULTURE

## 2015-07-29 MED ORDER — CIPROFLOXACIN HCL 250 MG PO TABS: 250.0000 mg | ORAL_TABLET | Freq: Two times a day (BID) | ORAL | Status: DC

## 2015-07-29 NOTE — Addendum Note (Signed)
Addended by: Vicie Mutters R on: 07/29/2015 10:20 AM   Modules accepted: Orders, SmartSet

## 2015-08-09 DIAGNOSIS — M79675 Pain in left toe(s): Secondary | ICD-10-CM | POA: Diagnosis not present

## 2015-08-09 DIAGNOSIS — M2040 Other hammer toe(s) (acquired), unspecified foot: Secondary | ICD-10-CM | POA: Diagnosis not present

## 2015-08-22 DIAGNOSIS — K621 Rectal polyp: Secondary | ICD-10-CM | POA: Diagnosis not present

## 2015-08-29 ENCOUNTER — Ambulatory Visit (INDEPENDENT_AMBULATORY_CARE_PROVIDER_SITE_OTHER): Payer: Medicare Other | Admitting: *Deleted

## 2015-08-29 DIAGNOSIS — R319 Hematuria, unspecified: Secondary | ICD-10-CM

## 2015-08-29 DIAGNOSIS — N390 Urinary tract infection, site not specified: Secondary | ICD-10-CM | POA: Diagnosis not present

## 2015-08-29 NOTE — Progress Notes (Signed)
Patient ID: Marie [name], female   DOB: 21-Jan-1936, 79 y.o.   MRN: [id number] Patient presents for 1 month recheck UA, C&S.  Patient completed abx AD.

## 2015-08-30 LAB — URINALYSIS, ROUTINE W REFLEX MICROSCOPIC
Bilirubin Urine: NEGATIVE
Glucose, UA: NEGATIVE
Ketones, ur: NEGATIVE
NITRITE: NEGATIVE
PH: 7.5 (ref 5.0–8.0)
PROTEIN: NEGATIVE
Specific Gravity, Urine: 1.01 (ref 1.001–1.035)

## 2015-08-30 LAB — URINE CULTURE: Colony Count: 6000

## 2015-08-30 LAB — URINALYSIS, MICROSCOPIC ONLY
BACTERIA UA: NONE SEEN [HPF]
CASTS: NONE SEEN [LPF]
Crystals: NONE SEEN [HPF]
RBC / HPF: NONE SEEN RBC/HPF (ref <=2)
SQUAMOUS EPITHELIAL / LPF: NONE SEEN [HPF] (ref <=5)
Yeast: NONE SEEN [HPF]

## 2015-09-03 ENCOUNTER — Encounter: Payer: Self-pay | Admitting: Internal Medicine

## 2015-10-16 ENCOUNTER — Encounter: Payer: Self-pay | Admitting: Internal Medicine

## 2015-10-16 ENCOUNTER — Ambulatory Visit (INDEPENDENT_AMBULATORY_CARE_PROVIDER_SITE_OTHER): Payer: Medicare Other | Admitting: Internal Medicine

## 2015-10-16 VITALS — BP 112/64 | HR 80 | Temp 97.3°F | Resp 16 | Ht 64.0 in | Wt 176.0 lb

## 2015-10-16 DIAGNOSIS — K219 Gastro-esophageal reflux disease without esophagitis: Secondary | ICD-10-CM

## 2015-10-16 DIAGNOSIS — Z1331 Encounter for screening for depression: Secondary | ICD-10-CM

## 2015-10-16 DIAGNOSIS — G609 Hereditary and idiopathic neuropathy, unspecified: Secondary | ICD-10-CM

## 2015-10-16 DIAGNOSIS — R7309 Other abnormal glucose: Secondary | ICD-10-CM | POA: Diagnosis not present

## 2015-10-16 DIAGNOSIS — J449 Chronic obstructive pulmonary disease, unspecified: Secondary | ICD-10-CM

## 2015-10-16 DIAGNOSIS — Z9181 History of falling: Secondary | ICD-10-CM

## 2015-10-16 DIAGNOSIS — J4489 Other specified chronic obstructive pulmonary disease: Secondary | ICD-10-CM

## 2015-10-16 DIAGNOSIS — E559 Vitamin D deficiency, unspecified: Secondary | ICD-10-CM | POA: Diagnosis not present

## 2015-10-16 DIAGNOSIS — Z1212 Encounter for screening for malignant neoplasm of rectum: Secondary | ICD-10-CM

## 2015-10-16 DIAGNOSIS — R7303 Prediabetes: Secondary | ICD-10-CM

## 2015-10-16 DIAGNOSIS — Z79899 Other long term (current) drug therapy: Secondary | ICD-10-CM

## 2015-10-16 DIAGNOSIS — I10 Essential (primary) hypertension: Secondary | ICD-10-CM

## 2015-10-16 DIAGNOSIS — R296 Repeated falls: Secondary | ICD-10-CM | POA: Diagnosis not present

## 2015-10-16 DIAGNOSIS — E785 Hyperlipidemia, unspecified: Secondary | ICD-10-CM | POA: Diagnosis not present

## 2015-10-16 DIAGNOSIS — Z1389 Encounter for screening for other disorder: Secondary | ICD-10-CM | POA: Diagnosis not present

## 2015-10-16 LAB — CBC WITH DIFFERENTIAL/PLATELET
BASOS ABS: 0.1 10*3/uL (ref 0.0–0.1)
Basophils Relative: 1 % (ref 0–1)
Eosinophils Absolute: 0.1 10*3/uL (ref 0.0–0.7)
Eosinophils Relative: 1 % (ref 0–5)
HEMATOCRIT: 39.7 % (ref 36.0–46.0)
HEMOGLOBIN: 13.9 g/dL (ref 12.0–15.0)
LYMPHS PCT: 19 % (ref 12–46)
Lymphs Abs: 1.5 10*3/uL (ref 0.7–4.0)
MCH: 32.6 pg (ref 26.0–34.0)
MCHC: 35 g/dL (ref 30.0–36.0)
MCV: 93 fL (ref 78.0–100.0)
MONO ABS: 0.6 10*3/uL (ref 0.1–1.0)
MPV: 10.3 fL (ref 8.6–12.4)
Monocytes Relative: 7 % (ref 3–12)
NEUTROS ABS: 5.7 10*3/uL (ref 1.7–7.7)
NEUTROS PCT: 72 % (ref 43–77)
Platelets: 256 10*3/uL (ref 150–400)
RBC: 4.27 MIL/uL (ref 3.87–5.11)
RDW: 14.4 % (ref 11.5–15.5)
WBC: 7.9 10*3/uL (ref 4.0–10.5)

## 2015-10-16 MED ORDER — FUROSEMIDE 40 MG PO TABS: ORAL_TABLET | ORAL | Status: DC

## 2015-10-16 NOTE — Patient Instructions (Signed)
Recommend Adult Low Dose Aspirin or   coated  Aspirin 81 mg daily   To reduce risk of Colon Cancer 20 %,   Skin Cancer 26 % ,   Melanoma 46%   and   Pancreatic cancer 60%   ++++++++++++++++++++++++++++++++++++++++++++++++++++++  Vitamin D goal   is between 70-100.   Please make sure that you are taking your Vitamin D as directed.   It is very important as a natural anti-inflammatory   helping hair, skin, and nails, as well as reducing stroke and heart attack risk.   It helps your bones and helps with mood.  It also decreases numerous cancer risks so please take it as directed.   Low Vit D is associated with a 200-300% higher risk for CANCER   and 200-300% higher risk for HEART   ATTACK  &  STROKE.   ......................................  It is also associated with higher death rate at younger ages,   autoimmune diseases like Rheumatoid arthritis, Lupus, Multiple Sclerosis.     Also many other serious conditions, like depression, Alzheimer's  Dementia, infertility, muscle aches, fatigue, fibromyalgia - just to name a few.  ++++++++++++++++++++++++++++++++++++++++++++++++  Recommend the book "The END of DIETING" by Dr Joel Fuhrman   & the book "The END of DIABETES " by Dr Joel Fuhrman  At [employer] - get book & Audio CD's     Being diabetic has a  300% increased risk for heart attack, stroke, cancer, and alzheimer- type vascular dementia. It is very important that you work harder with diet by avoiding all foods that are white. Avoid white rice (brown & wild rice is OK), white potatoes (sweetpotatoes in moderation is OK), White bread or wheat bread or anything made out of white flour like bagels, donuts, rolls, buns, biscuits, cakes, pastries, cookies, pizza crust, and pasta (made from white flour & egg whites) - vegetarian pasta or spinach or wheat pasta is OK. Multigrain breads like Arnold's or [employer], or multigrain sandwich thins or flatbreads.  Diet,  exercise and weight loss can reverse and cure diabetes in the early stages.  Diet, exercise and weight loss is very important in the control and prevention of complications of diabetes which affects every system in your body, ie. Brain - dementia/stroke, eyes - glaucoma/blindness, heart - heart attack/heart failure, kidneys - dialysis, stomach - gastric paralysis, intestines - malabsorption, nerves - severe painful neuritis, circulation - gangrene & loss of a leg(s), and finally cancer and Alzheimers.    I recommend avoid fried & greasy foods,  sweets/candy, white rice (brown or wild rice or Quinoa is OK), white potatoes (sweet potatoes are OK) - anything made from white flour - bagels, doughnuts, rolls, buns, biscuits,white and wheat breads, pizza crust and traditional pasta made of white flour & egg white(vegetarian pasta or spinach or wheat pasta is OK).  Multi-grain bread is OK - like multi-grain flat bread or sandwich thins. Avoid alcohol in excess. Exercise is also important.    Eat all the vegetables you want - avoid meat, especially red meat and dairy - especially cheese.  Cheese is the most concentrated form of trans-fats which is the worst thing to clog up our arteries. Veggie cheese is OK which can be found in the fresh produce section at [employer] or [employer] or [employer]  ++++++++++++++++++++++++++++++++++++++++++++++++++ DASH Eating Plan  DASH stands for "Dietary Approaches to Stop Hypertension."   The DASH eating plan is a healthy eating plan that has been shown to reduce high   blood pressure (hypertension). Additional health benefits may include reducing the risk of type 2 diabetes mellitus, heart disease, and stroke. The DASH eating plan may also help with weight loss.  WHAT DO I NEED TO KNOW ABOUT THE DASH EATING PLAN? For the DASH eating plan, you will follow these general guidelines:  Choose foods with a percent daily value for sodium of less than 5% (as listed on the food  label).  Use salt-free seasonings or herbs instead of table salt or sea salt.  Check with your health care provider or pharmacist before using salt substitutes.  Eat lower-sodium products, often labeled as "lower sodium" or "no salt added."  Eat fresh foods.  Eat more vegetables, fruits, and low-fat dairy products.    Choose whole grains. Look for the word "whole" as the first word in the ingredient list.  Choose fish   Limit sweets, desserts, sugars, and sugary drinks.  Choose heart-healthy fats.  Eat veggie cheese   Eat more home-cooked food and less restaurant, buffet, and fast food.  Limit fried foods.  Cook foods using methods other than frying.  Limit canned vegetables. If you do use them, rinse them well to decrease the sodium.  When eating at a restaurant, ask that your food be prepared with less salt, or no salt if possible.                      WHAT FOODS CAN I EAT?  Seek help from a dietitian for individual calorie needs.  Grains Whole grain or whole wheat bread. Brown rice. Whole grain or whole wheat pasta. Quinoa, bulgur, and whole grain cereals. Low-sodium cereals. Corn or whole wheat flour tortillas. Whole grain cornbread. Whole grain crackers. Low-sodium crackers.  Vegetables Fresh or frozen vegetables (raw, steamed, roasted, or grilled). Low-sodium or reduced-sodium tomato and vegetable juices. Low-sodium or reduced-sodium tomato sauce and paste. Low-sodium or reduced-sodium canned vegetables.   Fruits All fresh, canned (in natural juice), or frozen fruits.  Protein Products  All fish and seafood.  Dried beans, peas, or lentils. Unsalted nuts and seeds. Unsalted canned beans.  Dairy Low-fat dairy products, such as skim or 1% milk, 2% or reduced-fat cheeses, low-fat ricotta or cottage cheese, or plain low-fat yogurt. Low-sodium or reduced-sodium cheeses.  Fats and Oils Tub margarines without trans fats. Light or reduced-fat mayonnaise and salad  dressings (reduced sodium). Avocado. Safflower, olive, or canola oils. Natural peanut or almond butter.  Other Unsalted popcorn and pretzels. The items listed above may not be a complete list of recommended foods or beverages. Contact your dietitian for more options.  +++++++++++++++++++++++++++++++++++++++++++  WHAT FOODS ARE NOT RECOMMENDED?  Grains/ White flour or wheat flour  White bread. White pasta. White rice. Refined cornbread. Bagels and croissants. Crackers that contain trans fat.  Vegetables  Creamed or fried vegetables. Vegetables in a . Regular canned vegetables. Regular canned tomato sauce and paste. Regular tomato and vegetable juices.  Fruits Dried fruits. Canned fruit in light or heavy syrup. Fruit juice.  Meat and Other Protein Products Meat in general. Fatty cuts of meat. Ribs, chicken wings, bacon, sausage, bologna, salami, chitterlings, fatback, hot dogs, bratwurst, and packaged luncheon meats.  Dairy Whole or 2% milk, cream, half-and-half, and cream cheese. Whole-fat or sweetened yogurt. Full-fat cheeses or blue cheese. Nondairy creamers and whipped toppings. Processed cheese, cheese spreads, or cheese curds.  Condiments Onion and garlic salt, seasoned salt, table salt, and sea salt. Canned and packaged gravies. Worcestershire sauce. Tartar sauce.  Barbecue sauce. Teriyaki sauce. Soy sauce, including reduced sodium. Steak sauce. Fish sauce. Oyster sauce. Cocktail sauce. Horseradish. Ketchup and mustard. Meat flavorings and tenderizers. Bouillon cubes. Hot sauce. Tabasco sauce. Marinades. Taco seasonings. Relishes.  Fats and Oils Butter, stick margarine, lard, shortening, ghee, and bacon fat. Coconut, palm kernel, or palm oils. Regular salad dressings.  Pickles and olives. Salted popcorn and pretzels. The items listed above may not be a complete list of foods and beverages to avoid.   Preventive Care for Adults  A healthy lifestyle and preventive care can  promote health and wellness. Preventive health guidelines for women include the following key practices.  A routine yearly physical is a good way to check with your health care provider about your health and preventive screening. It is a chance to share any concerns and updates on your health and to receive a thorough exam.  Visit your dentist for a routine exam and preventive care every 6 months. Brush your teeth twice a day and floss once a day. Good oral hygiene prevents tooth decay and gum disease.  The frequency of eye exams is based on your age, health, family medical history, use of contact lenses, and other factors. Follow your health care provider's recommendations for frequency of eye exams.  Eat a healthy diet. Foods like vegetables, fruits, whole grains, low-fat dairy products, and lean protein foods contain the nutrients you need without too many calories. Decrease your intake of foods high in solid fats, added sugars, and salt. Eat the right amount of calories for you.Get information about a proper diet from your health care provider, if necessary.  Regular physical exercise is one of the most important things you can do for your health. Most adults should get at least 150 minutes of moderate-intensity exercise (any activity that increases your heart rate and causes you to sweat) each week. In addition, most adults need muscle-strengthening exercises on 2 or more days a week.  Maintain a healthy weight. The body mass index (BMI) is a screening tool to identify possible weight problems. It provides an estimate of body fat based on height and weight. Your health care provider can find your BMI and can help you achieve or maintain a healthy weight.For adults 20 years and older:  A BMI below 18.5 is considered underweight.  A BMI of 18.5 to 24.9 is normal.  A BMI of 25 to 29.9 is considered overweight.  A BMI of 30 and above is considered obese.  Maintain normal blood lipids and  cholesterol levels by exercising and minimizing your intake of saturated fat. Eat a balanced diet with plenty of fruit and vegetables. If your lipid or cholesterol levels are high, you are over 50, or you are at high risk for heart disease, you may need your cholesterol levels checked more frequently.Ongoing high lipid and cholesterol levels should be treated with medicines if diet and exercise are not working.  If you smoke, find out from your health care provider how to quit. If you do not use tobacco, do not start.  Lung cancer screening is recommended for adults aged 63-80 years who are at high risk for developing lung cancer because of a history of smoking. A yearly low-dose CT scan of the lungs is recommended for people who have at least a 30-pack-year history of smoking and are a current smoker or have quit within the past 15 years. A pack year of smoking is smoking an average of 1 pack of cigarettes a day for  1 year (for example: 1 pack a day for 30 years or 2 packs a day for 15 years). Yearly screening should continue until the smoker has stopped smoking for at least 15 years. Yearly screening should be stopped for people who develop a health problem that would prevent them from having lung cancer treatment.  Avoid use of street drugs. Do not share needles with anyone. Ask for help if you need support or instructions about stopping the use of drugs.  High blood pressure causes heart disease and increases the risk of stroke.  Ongoing high blood pressure should be treated with medicines if weight loss and exercise do not work.  If you are 67-44 years old, ask your health care provider if you should take aspirin to prevent strokes.  Diabetes screening involves taking a blood sample to check your fasting blood sugar level. This should be done once every 3 years, after age 62, if you are within normal weight and without risk factors for diabetes. Testing should be considered at a younger age or be  carried out more frequently if you are overweight and have at least 1 risk factor for diabetes.  Breast cancer screening is essential preventive care for women. You should practice "breast self-awareness." This means understanding the normal appearance and feel of your breasts and may include breast self-examination. Any changes detected, no matter how small, should be reported to a health care provider. Women in their 75s and 30s should have a clinical breast exam (CBE) by a health care provider as part of a regular health exam every 1 to 3 years. After age 30, women should have a CBE every year. Starting at age 43, women should consider having a mammogram (breast X-ray test) every year. Women who have a family history of breast cancer should talk to their health care provider about genetic screening. Women at a high risk of breast cancer should talk to their health care providers about having an MRI and a mammogram every year.  Breast cancer gene (BRCA)-related cancer risk assessment is recommended for women who have family members with BRCA-related cancers. BRCA-related cancers include breast, ovarian, tubal, and peritoneal cancers. Having family members with these cancers may be associated with an increased risk for harmful changes (mutations) in the breast cancer genes BRCA1 and BRCA2. Results of the assessment will determine the need for genetic counseling and BRCA1 and BRCA2 testing.  Routine pelvic exams to screen for cancer are no longer recommended for nonpregnant women who are considered low risk for cancer of the pelvic organs (ovaries, uterus, and vagina) and who do not have symptoms. Ask your health care provider if a screening pelvic exam is right for you.  If you have had past treatment for cervical cancer or a condition that could lead to cancer, you need Pap tests and screening for cancer for at least 20 years after your treatment. If Pap tests have been discontinued, your risk factors  (such as having a new sexual partner) need to be reassessed to determine if screening should be resumed. Some women have medical problems that increase the chance of getting cervical cancer. In these cases, your health care provider may recommend more frequent screening and Pap tests.    Colorectal cancer can be detected and often prevented. Most routine colorectal cancer screening begins at the age of 76 years and continues through age 75 years. However, your health care provider may recommend screening at an earlier age if you have risk factors for colon  cancer. On a yearly basis, your health care provider may provide home test kits to check for hidden blood in the stool. Use of a small camera at the end of a tube, to directly examine the colon (sigmoidoscopy or colonoscopy), can detect the earliest forms of colorectal cancer. Talk to your health care provider about this at age 49, when routine screening begins. Direct exam of the colon should be repeated every 5-10 years through age 64 years, unless early forms of pre-cancerous polyps or small growths are found.  Osteoporosis is a disease in which the bones lose minerals and strength with aging. This can result in serious bone fractures or breaks. The risk of osteoporosis can be identified using a bone density scan. Women ages 79 years and over and women at risk for fractures or osteoporosis should discuss screening with their health care providers. Ask your health care provider whether you should take a calcium supplement or vitamin D to reduce the rate of osteoporosis.  Menopause can be associated with physical symptoms and risks. Hormone replacement therapy is available to decrease symptoms and risks. You should talk to your health care provider about whether hormone replacement therapy is right for you.  Use sunscreen. Apply sunscreen liberally and repeatedly throughout the day. You should seek shade when your shadow is shorter than you. Protect  yourself by wearing long sleeves, pants, a wide-brimmed hat, and sunglasses year round, whenever you are outdoors.  Once a month, do a whole body skin exam, using a mirror to look at the skin on your back. Tell your health care provider of new moles, moles that have irregular borders, moles that are larger than a pencil eraser, or moles that have changed in shape or color.  Stay current with required vaccines (immunizations).  Influenza vaccine. All adults should be immunized every year.  Tetanus, diphtheria, and acellular pertussis (Td, Tdap) vaccine. Pregnant women should receive 1 dose of Tdap vaccine during each pregnancy. The dose should be obtained regardless of the length of time since the last dose. Immunization is preferred during the 27th-36th week of gestation. An adult who has not previously received Tdap or who does not know her vaccine status should receive 1 dose of Tdap. This initial dose should be followed by tetanus and diphtheria toxoids (Td) booster doses every 10 years. Adults with an unknown or incomplete history of completing a 3-dose immunization series with Td-containing vaccines should begin or complete a primary immunization series including a Tdap dose. Adults should receive a Td booster every 10 years.    Zoster vaccine. One dose is recommended for adults aged 77 years or older unless certain conditions are present.    Pneumococcal 13-valent conjugate (PCV13) vaccine. When indicated, a person who is uncertain of her immunization history and has no record of immunization should receive the PCV13 vaccine. An adult aged 68 years or older who has certain medical conditions and has not been previously immunized should receive 1 dose of PCV13 vaccine. This PCV13 should be followed with a dose of pneumococcal polysaccharide (PPSV23) vaccine. The PPSV23 vaccine dose should be obtained at least 8 weeks after the dose of PCV13 vaccine. An adult aged 13 years or older who has  certain medical conditions and previously received 1 or more doses of PPSV23 vaccine should receive 1 dose of PCV13. The PCV13 vaccine dose should be obtained 1 or more years after the last PPSV23 vaccine dose.    Pneumococcal polysaccharide (PPSV23) vaccine. When PCV13 is also indicated,  PCV13 should be obtained first. All adults aged 65 years and older should be immunized. An adult younger than age 65 years who has certain medical conditions should be immunized. Any person who resides in a nursing home or long-term care facility should be immunized. An adult smoker should be immunized. People with an immunocompromised condition and certain other conditions should receive both PCV13 and PPSV23 vaccines. People with human immunodeficiency virus (HIV) infection should be immunized as soon as possible after diagnosis. Immunization during chemotherapy or radiation therapy should be avoided. Routine use of PPSV23 vaccine is not recommended for American Indians, [place] Natives, or people younger than 65 years unless there are medical conditions that require PPSV23 vaccine. When indicated, people who have unknown immunization and have no record of immunization should receive PPSV23 vaccine. One-time revaccination 5 years after the first dose of PPSV23 is recommended for people aged 19-64 years who have chronic kidney failure, nephrotic syndrome, asplenia, or immunocompromised conditions. People who received 1-2 doses of PPSV23 before age 65 years should receive another dose of PPSV23 vaccine at age 65 years or later if at least 5 years have passed since the previous dose. Doses of PPSV23 are not needed for people immunized with PPSV23 at or after age 65 years.   Preventive Services / Frequency  Ages 65 years and over  Blood pressure check.  Lipid and cholesterol check.  Lung cancer screening. / Every year if you are aged 55-80 years and have a 30-pack-year history of smoking and currently smoke or have quit  within the past 15 years. Yearly screening is stopped once you have quit smoking for at least 15 years or develop a health problem that would prevent you from having lung cancer treatment.  Clinical breast exam.** / Every year after age 40 years.  BRCA-related cancer risk assessment.** / For women who have family members with a BRCA-related cancer (breast, ovarian, tubal, or peritoneal cancers).  Mammogram.** / Every year beginning at age 40 years and continuing for as long as you are in good health. Consult with your health care provider.  Pap test.** / Every 3 years starting at age 30 years through age 65 or 70 years with 3 consecutive normal Pap tests. Testing can be stopped between 65 and 70 years with 3 consecutive normal Pap tests and no abnormal Pap or HPV tests in the past 10 years.  Fecal occult blood test (FOBT) of stool. / Every year beginning at age 50 years and continuing until age 75 years. You may not need to do this test if you get a colonoscopy every 10 years.  Flexible sigmoidoscopy or colonoscopy.** / Every 5 years for a flexible sigmoidoscopy or every 10 years for a colonoscopy beginning at age 50 years and continuing until age 75 years.  Hepatitis C blood test.** / For all people born from 1945 through 1965 and any individual with known risks for hepatitis C.  Osteoporosis screening.** / A one-time screening for women ages 65 years and over and women at risk for fractures or osteoporosis.  Skin self-exam. / Monthly.  Influenza vaccine. / Every year.  Tetanus, diphtheria, and acellular pertussis (Tdap/Td) vaccine.** / 1 dose of Td every 10 years.  Zoster vaccine.** / 1 dose for adults aged 60 years or older.  Pneumococcal 13-valent conjugate (PCV13) vaccine.** / Consult your health care provider.  Pneumococcal polysaccharide (PPSV23) vaccine.** / 1 dose for all adults aged 65 years and older. Screening for abdominal aortic aneurysm (AAA)  by ultrasound   is recommended  for people who have history of high blood pressure or who are current or former smokers.

## 2015-10-16 NOTE — Progress Notes (Signed)
Patient ID: Marie [name], female   DOB: 05/28/1936, 79 y.o.   MRN: [id number]   Comprehensive Evaluation &  Examination  This very nice 79 y.o. MWF presents for presents for a comprehensive evaluation and management of multiple medical co-morbidities.  Patient has been followed for HTN, Prediabetes, Hyperlipidemia, COPD/Asthma, Peripheral sensory Neuropathy with unstable gait and Vitamin D Deficiency. Patient also has hx/o COPD/ chronic intermittent which has improved on low dose maintenance prednisone.     HTN predates since 1996. Patient's BP has been controlled at home and patient denies any cardiac symptoms as chest pain, palpitations, shortness of breath, dizziness or ankle swelling. Today's BP: 112/64 mmHg    Patient's hyperlipidemia is controlled with diet and medications. Patient denies myalgias or other medication SE's. Last lipids were at goal with  Cholesterol 145; HDL 69; LDL 53; Triglycerides 115 on 06/21/2015.   Patient has prediabetes predating since Oct 2013 with A1c 6.0% and patient denies reactive hypoglycemic symptoms, visual blurring, diabetic polys, or paresthesias. Last A1c was 5.9% on 06/21/2015.   Finally, patient has history of Vitamin D Deficiency of "10" in 2008  and last Vitamin D was 38 on 06/21/2015.    Medication Sig  . albuterol (2.5 MG/3ML) 0.083% neb soln Take 3 mLs by nebevery 6  hours as needed   . aspirin 81 MG tablet Take 81 mg  daily.   Marland Kitchen VITAMIN D  Take 5,000 Units daily.  Marland Kitchen ADVAIR DISKUS 250-50  Inhale 1 puff into the lungs 2 (two) times daily.  . Multiple Vitamin Take 1 capsule by mouth daily.   Marland Kitchen nystatin (MYCOSTATIN) 100000 UNIT/ML suspension Take 5 mLs by mouth 3 (three) times daily.   Marland Kitchen nystatin (MYCOSTATIN) 500000 UNITS TABS tablet Take 1 tablet by mouth once a week. Takes every Friday usually  . omeprazole  40 MG capsule TAKE 1 CAPSULE TWICE DAILY  FOR  ACID  REFLUX  . oxybutynin  5 MG tablet Take 1 tablet (5 mg total) by mouth daily.  . pravastatin  ( 40 MG tablet TAKE 1 TABLET EVERY DAY  FOR  CHOLESTEROL  . predniSONE 10 MG tablet TAKE 1 TABLET EVERY DAY WITH BREAKFAST  . rOPINIRole (REQUIP) 1 MG tablet Take 1/2 to 1 tablet 2 to 3 x daily if needed for Restless Legs  . traMADol 50 MG tablet TAKE 1 TABLET FOUR TIMES DAILY FOR PAIN   Allergies  Allergen Reactions  . Ertapenem     Other reaction(s): Redness Caused whole body to turn red  . Codeine Other (See Comments)    hallucinations  . Demerol Other (See Comments)    hallucinations  . Morphine And Related Other (See Comments)    hallucinations  . Other Other (See Comments)    Invan 7- pt became red all over  . Sulfate Other (See Comments)    unknown   Past Medical History  Diagnosis Date  . Incontinence     not indicated at this visit.  . Wears dentures   . Osteoporosis   . Bulging disc   . Hyperlipidemia   . Chronic bronchitis ([place])     due to congestion at times, on prednisone and advair  . GERD (gastroesophageal reflux disease)   . Neuropathy ([facility])     legs stay numb   . Depression     many years ago  . Hypertension     followed by intern med Dr. Marisue Brooklyn [phone]  . Tubulovillous adenoma of rectum   . Balance  disorder 2008.      Falls a lot.  She can be standing and then leans too far over to one side   . Wears glasses   . Hearing loss     mild  . Iatrogenic adrenal insufficiency ([place])   . Osteoarthritis     hands,   . COPD (chronic obstructive pulmonary disease) ([place])   . Shortness of breath dyspnea    Health Maintenance  Topic Date Due  . INFLUENZA VACCINE  05/19/2016  . TETANUS/TDAP  08/18/2023  . DEXA SCAN  Completed  . ZOSTAVAX  Completed  . PNA vac Low Risk Adult  Completed   Immunization History  Administered Date(s) Administered  . Influenza, High Dose Seasonal PF 08/23/2014, 07/24/2015  . Pneumococcal Conjugate-13 08/23/2014  . Pneumococcal-Unspecified 07/20/2003, 08/17/2013  . Td 08/17/2013  . Tdap 10/25/2012  . Zoster 03/19/2015    Past Surgical History  Procedure Laterality Date  . Incontinence surgery      multiple procedures, not cured  . Rectal surgery      by dr. Marlou Starks, removal of polyp  . Appendectomy    . Abdominal hysterectomy    . Tonsillectomy    . Eye surgery      bilateral cataract surgery and lens implant  . Dilation and curettage of uterus    . Rectal biopsy  09/21/2011    Procedure: BIOPSY RECTAL;  Surgeon: Merrie Roof, MD;  Location: [facility];  Service: General;  Laterality: N/A;  3-4 cm  . Finger surgery      fusions and debridements for OA  . External fixation leg  10/25/2012    Procedure: EXTERNAL FIXATION LEG;  Surgeon: Rozanna Box, MD;  Location: [facility];  Service: Orthopedics;  Laterality: Right;  . Knee arthroscopy with medial menisectomy Right 09/19/2014    Procedure: RIGHT KNEE ARTHROSCOPY WITH MEDIAL AND LATERAL MENISECTOMIES. CHONDROPLASTY OF PATELLA-FEMORAL JOINT;  Surgeon: Alta Corning, MD;  Location: [facility];  Service: Orthopedics;  Laterality: Right;  . Knee arthroscopy with lateral menisectomy Right 09/19/2014    Procedure: KNEE ARTHROSCOPY WITH LATERAL MENISECTOMY;  Surgeon: Alta Corning, MD;  Location: [facility];  Service: Orthopedics;  Laterality: Right;  . Chondroplasty  09/19/2014    Procedure: CHONDROPLASTY;  Surgeon: Alta Corning, MD;  Location: [facility];  Service: Orthopedics;;   Family History  Problem Relation Age of Onset  . Cancer Sister     pt unaware of what kind  . High blood pressure Mother   . COPD Mother   . Heart attack Father    Social History  Substance Use Topics  . Smoking status: Never Smoker   . Smokeless tobacco: Never Used  . Alcohol Use: No    ROS Constitutional: Denies fever, chills, weight loss/gain, headaches, insomnia,  night sweats, and change in appetite. Does c/o fatigue. Eyes: Denies redness, blurred vision, diplopia, discharge, itchy, watery eyes.  ENT: Denies discharge,  congestion, post nasal drip, epistaxis, sore throat, earache, hearing loss, dental pain, Tinnitus, Vertigo, Sinus pain, snoring.  Cardio: Denies chest pain, palpitations, irregular heartbeat, syncope, dyspnea, diaphoresis, orthopnea, PND, claudication, edema Respiratory: denies cough, dyspnea, DOE, pleurisy, hoarseness, laryngitis, wheezing.  Gastrointestinal: Denies dysphagia, heartburn, reflux, water brash, pain, cramps, nausea, vomiting, bloating, diarrhea, constipation, hematemesis, melena, hematochezia, jaundice, hemorrhoids Genitourinary: Denies dysuria, frequency, urgency, nocturia, hesitancy, discharge, hematuria, flank pain Breast: Breast lumps, nipple discharge, bleeding.  Musculoskeletal: Denies arthralgia, myalgia, stiffness, Jt. Swelling, pain, limp, and  strain/sprain. Denies falls. Skin: Denies puritis, rash, hives, warts, acne, eczema, changing in skin lesion Neuro: No weakness, tremor, incoordination, spasms, pain, but does report sting paresthesias of her feet.  Psychiatric: Denies confusion, memory loss, sensory loss. Denies Depression. Endocrine: Denies change in weight, skin, hair change, nocturia and  diabetic polys, visual blurring, hyper / hypo glycemic episodes.  Heme/Lymph: No excessive bleeding, bruising, enlarged lymph nodes.  Physical Exam  BP 112/64 mmHg  Pulse 80  Temp(Src) 97.3 F (36.3 C)  Resp 16  Ht 5\' 4"  (1.626 m)  Wt 176 lb (79.833 kg)  BMI 30.20 kg/m2   Baseline O2 sat 96%at rest and ambulating 100 feet, she became visibly dyspneic, but O2 sat remained at 96%.   General Appearance: Well nourished and in no apparent distress. Eyes: PERRLA, EOMs, conjunctiva no swelling or erythema, normal fundi and vessels. Sinuses: No frontal/maxillary tenderness ENT/Mouth: EACs patent / TMs  nl. Nares clear without erythema, swelling, mucoid exudates. Oral hygiene is good. No erythema, swelling, or exudate. Tongue normal, non-obstructing. Tonsils not swollen or  erythematous. Hearing normal.  Neck: Supple, thyroid normal. No bruits, nodes or JVD. Respiratory: Respiratory effort normal.  BS equal and clear bilateral without rales, rhonci, wheezing or stridor. Cardio: Heart sounds are normal with regular rate and rhythm and no murmurs, rubs or gallops. Peripheral pulses are normal and equal bilaterally without edema. No aortic or femoral bruits. Chest: symmetric with normal excursions and percussion. Breasts: Symmetric, without lumps, nipple discharge, retractions, or fibrocystic changes.  Abdomen: Flat, soft, with bowl sounds. Nontender, no guarding, rebound, hernias, masses, or organomegaly.  Lymphatics: Non tender without lymphadenopathy.  Musculoskeletal: Full ROM all peripheral extremities with generalized decrease in muscle power, tone and bulk. Gait is broad-based and stabilized with a rolling walker.  Skin: Warm and dry without rashes, lesions, cyanosis, clubbing or  ecchymosis.  Neuro: Cranial nerves intact. DTR's 1+ in UE's and absent in LE's. Normal muscle tone, no cerebellar symptoms. Sensation decreased at the mid shin bilaterally to touch, vibratory and monofilament testing.   Pysch: Alert and oriented X 3, normal affect, Insight and Judgment appropriate.   Assessment and Plan  1. Essential hypertension  - Microalbumin / creatinine urine ratio - EKG 12-Lead - [place], RETROPERITNL ABD,  LTD - TSH - furosemide (LASIX) 40 MG tablet; Take 1/2 to 1 tablet daily for BP and ankle swelling  Dispense: 90 tablet; Refill: 1  2. Hyperlipidemia  - Lipid panel - TSH  3. Prediabetes  - Hemoglobin A1c - Insulin, random  4. Vitamin D deficiency  - VITAMIN D 25 Hydroxy   5. Gastroesophageal reflux disease   6. COPD   7. Screening for rectal cancer  - POC Hemoccult Bld/Stl   8. Depression screen   9. At high risk for falls   10. Hereditary and idiopathic peripheral neuropathy   11. Other abnormal glucose  - Hemoglobin A1c -  Insulin, random  12. Medication management  - Urinalysis, Routine w reflex microscopic  - CBC with Differential/Platelet - BASIC METABOLIC PANEL WITH GFR - Hepatic function panel - Magnesium   Continue prudent diet as discussed, weight control, BP monitoring, regular exercise, and medications. Discussed med's effects and SE's. Screening labs and tests as requested with regular follow-up as recommended. Over 40 minutes of exam, counseling, chart review and high complex critical decision making was performed.

## 2015-10-17 ENCOUNTER — Other Ambulatory Visit: Payer: Self-pay | Admitting: Internal Medicine

## 2015-10-17 LAB — HEPATIC FUNCTION PANEL
ALK PHOS: 43 U/L (ref 33–130)
ALT: 17 U/L (ref 6–29)
AST: 16 U/L (ref 10–35)
Albumin: 3.9 g/dL (ref 3.6–5.1)
BILIRUBIN DIRECT: 0.1 mg/dL (ref <=0.2)
BILIRUBIN INDIRECT: 0.4 mg/dL (ref 0.2–1.2)
TOTAL PROTEIN: 5.9 g/dL — AB (ref 6.1–8.1)
Total Bilirubin: 0.5 mg/dL (ref 0.2–1.2)

## 2015-10-17 LAB — BASIC METABOLIC PANEL WITH GFR
BUN: 22 mg/dL (ref 7–25)
CHLORIDE: 101 mmol/L (ref 98–110)
CO2: 30 mmol/L (ref 20–31)
Calcium: 8.8 mg/dL (ref 8.6–10.4)
Creat: 1.18 mg/dL — ABNORMAL HIGH (ref 0.60–0.93)
GFR, EST NON AFRICAN AMERICAN: 45 mL/min — AB (ref >=60)
GFR, Est African American: 51 mL/min — ABNORMAL LOW (ref >=60)
GLUCOSE: 75 mg/dL (ref 65–99)
POTASSIUM: 3.5 mmol/L (ref 3.5–5.3)
Sodium: 144 mmol/L (ref 135–146)

## 2015-10-17 LAB — URINALYSIS, MICROSCOPIC ONLY
CASTS: NONE SEEN [LPF]
CRYSTALS: NONE SEEN [HPF]
SQUAMOUS EPITHELIAL / LPF: NONE SEEN [HPF] (ref <=5)
Yeast: NONE SEEN [HPF]

## 2015-10-17 LAB — LIPID PANEL
CHOL/HDL RATIO: 2 ratio (ref <=5.0)
Cholesterol: 145 mg/dL (ref 125–200)
HDL: 72 mg/dL (ref >=46)
LDL CALC: 47 mg/dL (ref <130)
Triglycerides: 129 mg/dL (ref <150)
VLDL: 26 mg/dL (ref <30)

## 2015-10-17 LAB — URINALYSIS, ROUTINE W REFLEX MICROSCOPIC
BILIRUBIN URINE: NEGATIVE
Glucose, UA: NEGATIVE
Ketones, ur: NEGATIVE
Nitrite: POSITIVE — AB
Protein, ur: NEGATIVE
SPECIFIC GRAVITY, URINE: 1.015 (ref 1.001–1.035)
pH: 6.5 (ref 5.0–8.0)

## 2015-10-17 LAB — MICROALBUMIN / CREATININE URINE RATIO
Creatinine, Urine: 106 mg/dL (ref 20–320)
Microalb Creat Ratio: 4 mcg/mg creat (ref <30)
Microalb, Ur: 0.4 mg/dL

## 2015-10-17 LAB — INSULIN, RANDOM: INSULIN: 10 u[IU]/mL (ref 2.0–19.6)

## 2015-10-17 LAB — TSH: TSH: 0.569 u[IU]/mL (ref 0.350–4.500)

## 2015-10-17 LAB — HEMOGLOBIN A1C
Hgb A1c MFr Bld: 6 % — ABNORMAL HIGH (ref <5.7)
Mean Plasma Glucose: 126 mg/dL — ABNORMAL HIGH (ref <117)

## 2015-10-17 LAB — MAGNESIUM: Magnesium: 2.2 mg/dL (ref 1.5–2.5)

## 2015-10-17 LAB — VITAMIN D 25 HYDROXY (VIT D DEFICIENCY, FRACTURES): Vit D, 25-Hydroxy: 45 ng/mL (ref 30–100)

## 2015-10-17 MED ORDER — CIPROFLOXACIN HCL 250 MG PO TABS: ORAL_TABLET | ORAL | Status: AC

## 2015-10-28 ENCOUNTER — Other Ambulatory Visit: Payer: Self-pay | Admitting: *Deleted

## 2015-10-28 DIAGNOSIS — Z1212 Encounter for screening for malignant neoplasm of rectum: Secondary | ICD-10-CM

## 2015-10-28 LAB — POC HEMOCCULT BLD/STL (HOME/3-CARD/SCREEN)
Card #2 Fecal Occult Blod, POC: NEGATIVE
FECAL OCCULT BLD: NEGATIVE
Fecal Occult Blood, POC: NEGATIVE

## 2015-11-01 ENCOUNTER — Other Ambulatory Visit: Payer: Self-pay | Admitting: Physician Assistant

## 2015-11-01 MED ORDER — TRAZODONE HCL 50 MG PO TABS: ORAL_TABLET | ORAL | Status: DC

## 2015-11-12 ENCOUNTER — Other Ambulatory Visit: Payer: Self-pay | Admitting: Internal Medicine

## 2015-11-12 DIAGNOSIS — J449 Chronic obstructive pulmonary disease, unspecified: Secondary | ICD-10-CM

## 2015-11-12 MED ORDER — MOMETASONE FURO-FORMOTEROL FUM 200-5 MCG/ACT IN AERO: INHALATION_SPRAY | RESPIRATORY_TRACT | Status: DC

## 2015-11-18 ENCOUNTER — Ambulatory Visit: Payer: Self-pay

## 2015-11-20 ENCOUNTER — Ambulatory Visit (INDEPENDENT_AMBULATORY_CARE_PROVIDER_SITE_OTHER): Payer: Medicare Other | Admitting: *Deleted

## 2015-11-20 ENCOUNTER — Other Ambulatory Visit: Payer: Self-pay | Admitting: *Deleted

## 2015-11-20 ENCOUNTER — Other Ambulatory Visit: Payer: Self-pay | Admitting: Internal Medicine

## 2015-11-20 DIAGNOSIS — N390 Urinary tract infection, site not specified: Secondary | ICD-10-CM | POA: Diagnosis not present

## 2015-11-20 LAB — URINALYSIS, ROUTINE W REFLEX MICROSCOPIC
Bilirubin Urine: NEGATIVE
Glucose, UA: NEGATIVE
HGB URINE DIPSTICK: NEGATIVE
KETONES UR: NEGATIVE
LEUKOCYTES UA: NEGATIVE
NITRITE: NEGATIVE
PH: 7.5 (ref 5.0–8.0)
Protein, ur: NEGATIVE
SPECIFIC GRAVITY, URINE: 1.011 (ref 1.001–1.035)

## 2015-11-20 MED ORDER — FLUTICASONE FUROATE-VILANTEROL 200-25 MCG/INH IN AEPB: 1.0000 | INHALATION_SPRAY | Freq: Every day | RESPIRATORY_TRACT | Status: DC

## 2015-11-20 NOTE — Progress Notes (Signed)
Patient ID: Marie [name], female   DOB: 1935/10/29, 80 y.o.   MRN: [id number] Patient presents for recheck UA, C&S per Dr. Idell Pickles orders.  Patient completed abx AD and denies any current UTI symptoms.

## 2015-11-22 LAB — URINE CULTURE

## 2016-01-08 ENCOUNTER — Other Ambulatory Visit: Payer: Self-pay | Admitting: Physician Assistant

## 2016-01-16 ENCOUNTER — Other Ambulatory Visit: Payer: Self-pay | Admitting: Internal Medicine

## 2016-01-28 ENCOUNTER — Ambulatory Visit: Payer: Self-pay | Admitting: Internal Medicine

## 2016-03-11 ENCOUNTER — Telehealth: Payer: Self-pay | Admitting: *Deleted

## 2016-03-11 ENCOUNTER — Other Ambulatory Visit: Payer: Self-pay | Admitting: Internal Medicine

## 2016-03-11 NOTE — Telephone Encounter (Signed)
Patient called and states she has sores in her mouth, possibly from the use of her inhalers.  Per Dr Melford Aase, she should try 1 teaspoon of vinegar in a glass of water and swish in her mouth 3-4 times a day.  Patient is aware.

## 2016-04-05 ENCOUNTER — Other Ambulatory Visit: Payer: Self-pay | Admitting: Internal Medicine

## 2016-04-23 ENCOUNTER — Other Ambulatory Visit: Payer: Self-pay | Admitting: Emergency Medicine

## 2016-04-28 ENCOUNTER — Ambulatory Visit (INDEPENDENT_AMBULATORY_CARE_PROVIDER_SITE_OTHER): Payer: Medicare Other | Admitting: Internal Medicine

## 2016-04-28 ENCOUNTER — Encounter: Payer: Self-pay | Admitting: Internal Medicine

## 2016-04-28 VITALS — BP 112/64 | HR 68 | Temp 97.7°F | Resp 16 | Ht 64.0 in | Wt 171.4 lb

## 2016-04-28 DIAGNOSIS — E785 Hyperlipidemia, unspecified: Secondary | ICD-10-CM | POA: Diagnosis not present

## 2016-04-28 DIAGNOSIS — I10 Essential (primary) hypertension: Secondary | ICD-10-CM | POA: Diagnosis not present

## 2016-04-28 DIAGNOSIS — E559 Vitamin D deficiency, unspecified: Secondary | ICD-10-CM

## 2016-04-28 DIAGNOSIS — Z79899 Other long term (current) drug therapy: Secondary | ICD-10-CM | POA: Diagnosis not present

## 2016-04-28 DIAGNOSIS — J449 Chronic obstructive pulmonary disease, unspecified: Secondary | ICD-10-CM

## 2016-04-28 DIAGNOSIS — R7303 Prediabetes: Secondary | ICD-10-CM

## 2016-04-28 LAB — BASIC METABOLIC PANEL WITH GFR
BUN: 19 mg/dL (ref 7–25)
CO2: 29 mmol/L (ref 20–31)
Calcium: 9.2 mg/dL (ref 8.6–10.4)
Chloride: 94 mmol/L — ABNORMAL LOW (ref 98–110)
Creat: 1.27 mg/dL — ABNORMAL HIGH (ref 0.60–0.93)
GFR, EST NON AFRICAN AMERICAN: 41 mL/min — AB (ref >=60)
GFR, Est African American: 47 mL/min — ABNORMAL LOW (ref >=60)
Glucose, Bld: 103 mg/dL — ABNORMAL HIGH (ref 65–99)
Potassium: 3.2 mmol/L — ABNORMAL LOW (ref 3.5–5.3)
Sodium: 138 mmol/L (ref 135–146)

## 2016-04-28 LAB — CBC WITH DIFFERENTIAL/PLATELET
BASOS ABS: 73 {cells}/uL (ref 0–200)
Basophils Relative: 1 %
EOS PCT: 1 %
Eosinophils Absolute: 73 cells/uL (ref 15–500)
HCT: 42.6 % (ref 35.0–45.0)
HEMOGLOBIN: 14.6 g/dL (ref 11.7–15.5)
LYMPHS ABS: 1387 {cells}/uL (ref 850–3900)
Lymphocytes Relative: 19 %
MCH: 31.8 pg (ref 27.0–33.0)
MCHC: 34.3 g/dL (ref 32.0–36.0)
MCV: 92.8 fL (ref 80.0–100.0)
MONO ABS: 438 {cells}/uL (ref 200–950)
MPV: 9.9 fL (ref 7.5–12.5)
Monocytes Relative: 6 %
Neutro Abs: 5329 cells/uL (ref 1500–7800)
Neutrophils Relative %: 73 %
Platelets: 280 10*3/uL (ref 140–400)
RBC: 4.59 MIL/uL (ref 3.80–5.10)
RDW: 13.4 % (ref 11.0–15.0)
WBC: 7.3 10*3/uL (ref 3.8–10.8)

## 2016-04-28 LAB — HEPATIC FUNCTION PANEL
ALT: 15 U/L (ref 6–29)
AST: 18 U/L (ref 10–35)
Albumin: 3.9 g/dL (ref 3.6–5.1)
Alkaline Phosphatase: 43 U/L (ref 33–130)
BILIRUBIN DIRECT: 0.2 mg/dL (ref <=0.2)
BILIRUBIN INDIRECT: 0.4 mg/dL (ref 0.2–1.2)
Total Bilirubin: 0.6 mg/dL (ref 0.2–1.2)
Total Protein: 6.1 g/dL (ref 6.1–8.1)

## 2016-04-28 LAB — LIPID PANEL
CHOL/HDL RATIO: 1.9 ratio (ref <=5.0)
Cholesterol: 146 mg/dL (ref 125–200)
HDL: 76 mg/dL (ref >=46)
LDL Cholesterol: 48 mg/dL (ref <130)
Triglycerides: 112 mg/dL (ref <150)
VLDL: 22 mg/dL (ref <30)

## 2016-04-28 LAB — HEMOGLOBIN A1C
HEMOGLOBIN A1C: 5.7 % — AB (ref <5.7)
Mean Plasma Glucose: 117 mg/dL

## 2016-04-28 LAB — MAGNESIUM: Magnesium: 2.1 mg/dL (ref 1.5–2.5)

## 2016-04-28 LAB — TSH: TSH: 0.54 m[IU]/L

## 2016-04-28 NOTE — Progress Notes (Signed)
Patient ID: Marie [name], female   DOB: [birth year], 80 y.o.   MRN: [id number]  [facility]                       [name], M.D.        [name]. Silverio Lay, P.A.-C       Starlyn Skeans, P.A.-C   [facility]                [address] [place] [id number] Telephone [phone] Telefax [phone]      This very nice 80 y.o. MWF presents for 6 month follow up with Hypertension, Hyperlipidemia, COPD, Peripheral Neuropathy, Pre-Diabetes and Vitamin D Deficiency. Patient , a non-smoker, has COPD and hx/o chronic intermittent asthma uses hrer inhalers on an occasional basis and does endorse DOE walking short distances. Denies recent cough,  congestion or sputum pdn. She also has a peripheral sensory neuropathy with abnormal unstable gait.      Patient is treated for HTN circa 1996  & BP has been controlled at home. Today's BP is  112/64 mmHg. Patient has had no complaints of any cardiac type chest pain, palpitations, dyspnea/orthopnea/PND, dizziness, claudication, or dependent edema.     Hyperlipidemia is controlled with diet & meds. Patient denies myalgias or other med SE's. Last Lipids were 10/16/2015: Cholesterol 145; HDL 72; LDL 47; Triglycerides 129 on      Also, the patient has history of PreDiabetes with A1c 6.0% in 2011 and has had no symptoms of reactive hypoglycemia, diabetic polys, paresthesias or visual blurring.  Last A1c was still 6.0% on 10/16/2015.     Further, the patient also has history of Vitamin D Deficiency of "10" in 2008  and supplements vitamin D without any suspected side-effects. Last vitamin D was 45 on 10/16/2015.  Medication Sig  . ADVAIR DISKUS 250-50  INHALE ONE DOSE BY MOUTH TWICE DAILY  . albuterol 2.5 MG/3ML  neb soln Take 3 mLs (2.5 mg total) by nebulization every 6 (six) hours as needed for wheezing or shortness of breath.  Marland Kitchen amitriptyline  10 MG  TAKE 1 TABLET  THREE TIMES DAILY  . aspirin 81 MG tablet Take 81 mg by mouth daily. Buys OTC  . benzonatate  100 MG  TAKE ONE CAPSULE BY MOUTH THREE TIMES DAILY AS NEEDED FOR COUGH  . VITAMIN D 1000 UNITS  Take 5 tablets (5,000 Units total) by mouth daily.  . Fluconazole  150 MG TAKE ONE TABLET BY MOUTH ONCE A WEEK FOR YEAST INFECTION  . FLUoxetine  40 MG TAKE 1 CAPSULE EVERY DAY  . BREO ELLIPTA 200-25  Inhale 1 puff into the lungs daily.  . furosemide40 MG  TAKE 1 TABLET EVERY DAY  . DULERA 200-5 MCG Take 1 to 2 Inhalations 2 x day  . MultiVit  Take 1 capsule by mouth daily.   Marland Kitchen nystatin 100000 UNIT/ML susp Take 5 mLs by mouth 3 (three) times daily.   Marland Kitchen nystatin  500000 UNITS Take 1 tablet by mouth once a week. Takes every Friday usually  . omeprazole  40 MG  TAKE 1 CAPSULE TWICE DAILY  FOR  ACID  REFLUX  . Oxybutynin 5 MG  Take 1 tablet (5 mg total) by mouth daily.  . pravastatin  40 MG TAKE 1 TABLET EVERY DAY  FOR  CHOLESTEROL  .  predniSONE  10 MG TAKE 1 TABLET EVERY DAY WITH BREAKFAST  . rOPINIRole (REQUIP) 1 MG  Take 1/2 to 1 tablet 2 to 3 x daily if needed for Restless Legs  . traMADol (ULTRAM) 50 MG TAKE 1 TABLET FOUR TIMES DAILY FOR PAIN  . traZODone (DESYREL) 50 MG  1/2-1 tablet for sleep   Allergies  Allergen Reactions  . Ertapenem Hives    Caused whole body to turn red Other reaction(s): Redness Caused whole body to turn red  . Codeine Other (See Comments)    hallucinations  . Demerol Other (See Comments)    hallucinations  . Morphine And Related Other (See Comments)    hallucinations  . Other Other (See Comments)    Invan 7- pt became red all over  . Sulfate Other (See Comments)    unknown    PMHx:   Past Medical History  Diagnosis Date  . Incontinence     not indicated at this visit.  . Wears dentures   . Osteoporosis   . Bulging disc   . Hyperlipidemia   . Chronic bronchitis ([place])     due to congestion at times, on prednisone and advair  . GERD (gastroesophageal reflux  disease)   . Neuropathy ([facility])     legs stay numb   . Depression     many years ago  . Hypertension     followed by intern med Dr. Marisue Brooklyn [phone]  . Tubulovillous adenoma of rectum   . Balance disorder 2008.      Falls a lot.  She can be standing and then leans too far over to one side   . Wears glasses   . Hearing loss     mild  . Iatrogenic adrenal insufficiency ([place])   . Osteoarthritis     hands,   . COPD (chronic obstructive pulmonary disease) ([place])   . Shortness of breath dyspnea    Immunization History  Administered Date(s) Administered  . Influenza, High Dose Seasonal PF 08/23/2014, 07/24/2015  . Pneumococcal Conjugate-13 08/23/2014  . Pneumococcal-Unspecified 07/20/2003, 08/17/2013  . Td 08/17/2013  . Tdap 10/25/2012  . Zoster 03/19/2015   Past Surgical History  Procedure Laterality Date  . Incontinence surgery      multiple procedures, not cured  . Rectal surgery      by dr. Marlou Starks, removal of polyp  . Appendectomy    . Abdominal hysterectomy    . Tonsillectomy    . Eye surgery      bilateral cataract surgery and lens implant  . Dilation and curettage of uterus    . Rectal biopsy  09/21/2011    Procedure: BIOPSY RECTAL;  Surgeon: Merrie Roof, MD;  Location: [facility];  Service: General;  Laterality: N/A;  3-4 cm  . Finger surgery      fusions and debridements for OA  . External fixation leg  10/25/2012    Procedure: EXTERNAL FIXATION LEG;  Surgeon: Rozanna Box, MD;  Location: [facility];  Service: Orthopedics;  Laterality: Right;  . Knee arthroscopy with medial menisectomy Right 09/19/2014    Procedure: RIGHT KNEE ARTHROSCOPY WITH MEDIAL AND LATERAL MENISECTOMIES. CHONDROPLASTY OF PATELLA-FEMORAL JOINT;  Surgeon: Alta Corning, MD;  Location: [facility];  Service: Orthopedics;  Laterality: Right;  . Knee arthroscopy with lateral menisectomy Right 09/19/2014    Procedure: KNEE ARTHROSCOPY WITH LATERAL MENISECTOMY;  Surgeon: Alta Corning, MD;   Location: [facility];  Service: Orthopedics;  Laterality:  Right;  . Chondroplasty  09/19/2014    Procedure: CHONDROPLASTY;  Surgeon: Alta Corning, MD;  Location: [facility];  Service: Orthopedics;;   FHx:    Reviewed / unchanged  SHx:    Reviewed / unchanged  Systems Review:  Constitutional: Denies fever, chills, wt changes, headaches, insomnia, fatigue, night sweats, change in appetite. Eyes: Denies redness, blurred vision, diplopia, discharge, itchy, watery eyes.  ENT: Denies discharge, congestion, post nasal drip, epistaxis, sore throat, earache, hearing loss, dental pain, tinnitus, vertigo, sinus pain, snoring.  CV: Denies chest pain, palpitations, irregular heartbeat, syncope, dyspnea, diaphoresis, orthopnea, PND, claudication or edema. Respiratory: denies cough, dyspnea, DOE, pleurisy, hoarseness, laryngitis, wheezing.  Gastrointestinal: Denies dysphagia, odynophagia, heartburn, reflux, water brash, abdominal pain or cramps, nausea, vomiting, bloating, diarrhea, constipation, hematemesis, melena, hematochezia  or hemorrhoids. Genitourinary: Denies dysuria, frequency, urgency, nocturia, hesitancy, discharge, hematuria or flank pain. Musculoskeletal: Denies arthralgias, myalgias, stiffness, jt. swelling, pain, limping or strain/sprain.  Skin: Denies pruritus, rash, hives, warts, acne, eczema or change in skin lesion(s). Neuro: No weakness, tremor, incoordination, spasms, paresthesia or pain. Psychiatric: Denies confusion, memory loss or sensory loss. Endo: Denies change in weight, skin or hair change.  Heme/Lymph: No excessive bleeding, bruising or enlarged lymph nodes.  Physical Exam  BP 112/64 mmHg  Pulse 68  Temp(Src) 97.7 F (36.5 C)  Resp 16  Ht 5\' 4"  (1.626 m)  Wt 171 lb 6.4 oz (77.747 kg)  BMI 29.41 kg/m2  O2 sat 96% on rm air & drops to 93-4% walking briskly 50 feet.  Appears well nourished and in no distress. Eyes: PERRLA, EOMs,  conjunctiva no swelling or erythema. Sinuses: No frontal/maxillary tenderness ENT/Mouth: EAC's clear, TM's nl w/o erythema, bulging. Nares clear w/o erythema, swelling, exudates. Oropharynx clear without erythema or exudates. Oral hygiene is good. Tongue normal, non obstructing. Hearing intact.  Neck: Supple. Thyroid nl. Car 2+/2+ without bruits, nodes or JVD. Chest: Respirations nl with BS clear & equal w/o rales, rhonchi, wheezing or stridor.  Cor: Heart sounds normal w/ regular rate and rhythm without sig. murmurs, gallops, clicks, or rubs. Peripheral pulses normal and equal  without edema.  Abdomen: Soft & bowel sounds normal. Non-tender w/o guarding, rebound, hernias, masses, or organomegaly.  Lymphatics: Unremarkable.  Musculoskeletal: Full ROM all peripheral extremities, joint stability, 5/5 strength and broad based gait requiring 1+ assistance for balance.   Skin: Warm, dry without exposed rashes, lesions or ecchymosis apparent.  Neuro: Cranial nerves intact, DTR's 1+ in UE & absent in LE's. Decreased sensory in a stocking distribution. Generalized decrease in muscle , power, tone & bulk.  Pysch: Alert & oriented x 3.  Insight and judgement nl & appropriate. No ideations.  Assessment and Plan:  1. Essential hypertension  - Continue medication, monitor blood pressure at home. Continue DASH diet. Reminder to go to the ER if any CP, SOB, nausea, dizziness, severe HA, changes vision/speech, left arm numbness and tingling and jaw pain. - TSH  2. Hyperlipidemia  - Continue diet/meds, exercise,& lifestyle modifications. Continue monitor periodic cholesterol/liver & renal functions  - Lipid panel - TSH  3. Prediabetes  - Continue diet, exercise, lifestyle modifications. Monitor appropriate labs. - Hemoglobin A1c - Insulin, random  4. Vitamin D deficiency  - Continue supplementation. - VITAMIN D 25 Hydroxy   5. COPD   6. Medication management  - CBC with  Differential/Platelet - BASIC METABOLIC PANEL WITH GFR - Hepatic function panel - Magnesium   Recommended regular exercise, BP monitoring, weight control,  and discussed med and SE's. Recommended labs to assess and monitor clinical status. Further disposition pending results of labs. Over 30 minutes of exam, counseling, chart review was performed

## 2016-04-28 NOTE — Patient Instructions (Signed)

## 2016-04-29 LAB — INSULIN, RANDOM: Insulin: 15.6 u[IU]/mL (ref 2.0–19.6)

## 2016-04-29 LAB — VITAMIN D 25 HYDROXY (VIT D DEFICIENCY, FRACTURES): Vit D, 25-Hydroxy: 66 ng/mL (ref 30–100)

## 2016-04-30 ENCOUNTER — Other Ambulatory Visit: Payer: Self-pay | Admitting: Internal Medicine

## 2016-04-30 DIAGNOSIS — Z139 Encounter for screening, unspecified: Secondary | ICD-10-CM

## 2016-05-13 ENCOUNTER — Ambulatory Visit
Admission: RE | Admit: 2016-05-13 | Discharge: 2016-05-13 | Disposition: A | Discharge status: Home / Self Care | Payer: Medicare Other | Source: Ambulatory Visit | Attending: Internal Medicine | Admitting: Internal Medicine

## 2016-05-13 DIAGNOSIS — Z1231 Encounter for screening mammogram for malignant neoplasm of breast: Secondary | ICD-10-CM | POA: Diagnosis not present

## 2016-05-13 DIAGNOSIS — Z139 Encounter for screening, unspecified: Secondary | ICD-10-CM

## 2016-05-18 ENCOUNTER — Encounter: Payer: Medicare Other | Admitting: Podiatry

## 2016-06-03 DIAGNOSIS — Z86010 Personal history of colonic polyps: Secondary | ICD-10-CM | POA: Diagnosis not present

## 2016-06-18 NOTE — Progress Notes (Signed)
This encounter was created in error - please disregard.

## 2016-06-23 ENCOUNTER — Other Ambulatory Visit: Payer: Self-pay | Admitting: Internal Medicine

## 2016-06-23 ENCOUNTER — Ambulatory Visit (INDEPENDENT_AMBULATORY_CARE_PROVIDER_SITE_OTHER): Payer: Medicare Other

## 2016-06-23 ENCOUNTER — Ambulatory Visit (INDEPENDENT_AMBULATORY_CARE_PROVIDER_SITE_OTHER): Payer: Medicare Other | Admitting: Podiatry

## 2016-06-23 ENCOUNTER — Encounter: Payer: Self-pay | Admitting: *Deleted

## 2016-06-23 ENCOUNTER — Encounter: Payer: Self-pay | Admitting: Podiatry

## 2016-06-23 VITALS — Ht 64.0 in | Wt 175.0 lb

## 2016-06-23 DIAGNOSIS — M2042 Other hammer toe(s) (acquired), left foot: Secondary | ICD-10-CM

## 2016-06-23 NOTE — Progress Notes (Signed)
   Subjective:    Patient ID: Marie [name], female    DOB: Feb 20, 1936, 80 y.o.   MRN: [id number]  HPI: She presents today with a chief complaint of a painful third toe to the left foot. She states it is aching and that is tender at the tip for the past several months. She states this seems to be worsening and develops a callus. She had been seeing a doctor in [place] who recommended trimming the callus on a regular basis. She would like to have something more permanent possible.    Review of Systems  Constitutional: Positive for unexpected weight change.  Respiratory: Positive for cough and shortness of breath.   Musculoskeletal: Positive for gait problem.  All other systems reviewed and are negative.      Objective:   Physical Exam: Vital signs are stable she is alert and oriented 3. Pleasant disposition. Pulses are strongly palpable neurologic sensorium is intact deep tendon reflexes are intact and muscle strength is normal. She does have a very unstable gait. Orthopedic evaluation demonstrates hallux abductovalgus deformity with hammertoe deformities. An elongated third digit with severe hammertoe deformity under lapping the second toes resulting in a distal clavus that is painful. Radiographs taken in the office today do demonstrate what appears to be chronic osteomyelitis to the distal aspect of the distal phalanx third digit left foot.        Assessment & Plan:  Osteomyelitis hammertoe deformity third digit left chronic pain.  Plan: Discussed etiology pathology conservative versus surgical therapies. At this point I recommended a disarticulation of the distal aspect of the toe at the level of the PIPJ third digit left foot. We signed a consent form today for this. We are requesting medical clearance from her primary provider once this is obtained we will consider IV sedation with local anesthetic and amputation of this toe.

## 2016-06-23 NOTE — Patient Instructions (Signed)

## 2016-06-24 ENCOUNTER — Other Ambulatory Visit: Payer: Self-pay | Admitting: Podiatry

## 2016-06-24 MED ORDER — CLINDAMYCIN HCL 150 MG PO CAPS: ORAL_CAPSULE | ORAL | 0 refills | Status: DC

## 2016-06-24 MED ORDER — TRAMADOL HCL 50 MG PO TABS: 50.0000 mg | ORAL_TABLET | Freq: Three times a day (TID) | ORAL | 0 refills | Status: DC | PRN

## 2016-06-26 ENCOUNTER — Encounter: Payer: Self-pay | Admitting: Podiatry

## 2016-06-26 DIAGNOSIS — M86679 Other chronic osteomyelitis, unspecified ankle and foot: Secondary | ICD-10-CM | POA: Diagnosis not present

## 2016-06-26 DIAGNOSIS — M2042 Other hammer toe(s) (acquired), left foot: Secondary | ICD-10-CM | POA: Diagnosis not present

## 2016-06-26 DIAGNOSIS — I10 Essential (primary) hypertension: Secondary | ICD-10-CM | POA: Diagnosis not present

## 2016-07-02 ENCOUNTER — Ambulatory Visit (INDEPENDENT_AMBULATORY_CARE_PROVIDER_SITE_OTHER): Payer: Medicare Other | Admitting: Podiatry

## 2016-07-02 ENCOUNTER — Encounter: Payer: Self-pay | Admitting: Podiatry

## 2016-07-02 ENCOUNTER — Ambulatory Visit: Payer: Medicare Other

## 2016-07-02 VITALS — BP 119/94 | HR 99 | Temp 98.8°F

## 2016-07-02 DIAGNOSIS — Z9889 Other specified postprocedural states: Secondary | ICD-10-CM

## 2016-07-02 DIAGNOSIS — M2042 Other hammer toe(s) (acquired), left foot: Secondary | ICD-10-CM

## 2016-07-02 NOTE — Progress Notes (Signed)
DOS 09.08.2017 Partial Disarticulation 3rd toe left foot at PIPJ

## 2016-07-02 NOTE — Progress Notes (Signed)
She presents today for her first postop visit. Date of surgery 06/26/2016. She denies fever chills nausea vomiting muscle aches and pains. States that she has not had any pain. She denies shortness of breath or chest pain.  Objective: Dry sterile dressing left foot was removed demonstrates no bleeding PIPJ disarticulation third digit left foot appears to be healing well without signs of infection. Radiographs demonstrate affectation of toe.  Assessment: Well-healing disarticulation third digit PIPJ left foot.  Plan: [place] today dressed with compressive dressing encouraged her to leave this on until next week at which time we may consider removing the sutures.

## 2016-07-08 ENCOUNTER — Emergency Department (HOSPITAL_COMMUNITY): Payer: Medicare Other

## 2016-07-08 ENCOUNTER — Encounter (HOSPITAL_COMMUNITY): Payer: Self-pay | Admitting: Emergency Medicine

## 2016-07-08 ENCOUNTER — Emergency Department (HOSPITAL_COMMUNITY)
Admission: EM | Admit: 2016-07-08 | Discharge: 2016-07-08 | Disposition: A | Discharge status: Home / Self Care | Payer: Medicare Other | Attending: Emergency Medicine | Admitting: Emergency Medicine

## 2016-07-08 DIAGNOSIS — Z79899 Other long term (current) drug therapy: Secondary | ICD-10-CM | POA: Insufficient documentation

## 2016-07-08 DIAGNOSIS — J45909 Unspecified asthma, uncomplicated: Secondary | ICD-10-CM | POA: Diagnosis not present

## 2016-07-08 DIAGNOSIS — Z7982 Long term (current) use of aspirin: Secondary | ICD-10-CM | POA: Insufficient documentation

## 2016-07-08 DIAGNOSIS — I959 Hypotension, unspecified: Secondary | ICD-10-CM | POA: Diagnosis not present

## 2016-07-08 DIAGNOSIS — I10 Essential (primary) hypertension: Secondary | ICD-10-CM | POA: Diagnosis not present

## 2016-07-08 DIAGNOSIS — J449 Chronic obstructive pulmonary disease, unspecified: Secondary | ICD-10-CM | POA: Insufficient documentation

## 2016-07-08 DIAGNOSIS — Z791 Long term (current) use of non-steroidal anti-inflammatories (NSAID): Secondary | ICD-10-CM | POA: Insufficient documentation

## 2016-07-08 DIAGNOSIS — N390 Urinary tract infection, site not specified: Secondary | ICD-10-CM | POA: Insufficient documentation

## 2016-07-08 DIAGNOSIS — M79651 Pain in right thigh: Secondary | ICD-10-CM | POA: Diagnosis not present

## 2016-07-08 DIAGNOSIS — M79604 Pain in right leg: Secondary | ICD-10-CM

## 2016-07-08 DIAGNOSIS — M25551 Pain in right hip: Secondary | ICD-10-CM | POA: Diagnosis not present

## 2016-07-08 DIAGNOSIS — M25552 Pain in left hip: Secondary | ICD-10-CM | POA: Diagnosis not present

## 2016-07-08 LAB — URINALYSIS, ROUTINE W REFLEX MICROSCOPIC
Bilirubin Urine: NEGATIVE
GLUCOSE, UA: NEGATIVE mg/dL
Ketones, ur: NEGATIVE mg/dL
Nitrite: NEGATIVE
Protein, ur: NEGATIVE mg/dL
SPECIFIC GRAVITY, URINE: 1.02 (ref 1.005–1.030)
pH: 5.5 (ref 5.0–8.0)

## 2016-07-08 LAB — URINE MICROSCOPIC-ADD ON

## 2016-07-08 MED ORDER — KETOROLAC TROMETHAMINE 30 MG/ML IJ SOLN
15.0000 mg | Freq: Once | INTRAMUSCULAR | Status: AC
  Administered 2016-07-08: 15 mg via INTRAVENOUS
  Filled 2016-07-08: qty 1

## 2016-07-08 MED ORDER — CEPHALEXIN 500 MG PO CAPS: 500.0000 mg | ORAL_CAPSULE | Freq: Four times a day (QID) | ORAL | 0 refills | Status: DC

## 2016-07-08 NOTE — ED Notes (Signed)
Pt in X-Ray ?

## 2016-07-08 NOTE — ED Notes (Signed)
PA at bedside.

## 2016-07-08 NOTE — ED Notes (Signed)
Pt on bedpan at this time.

## 2016-07-08 NOTE — ED Notes (Addendum)
RN at bedside with PA to assist with procedure.

## 2016-07-08 NOTE — ED Provider Notes (Signed)
[facility] DEPT Provider Note   CSN: [id number] Arrival date & time: 07/08/16  0504     History   Chief Complaint Chief Complaint  Patient presents with  . Leg Pain    HPI Marie [name] is a 80 y.o. female.  Pt is a 80 yo female with PMH of HTN, HLD, osteoarthritis, COPD and bulging disc who presents to the ED with right leg pain. Pt reports she started having mild right leg pain 5 days ago that she notes only is present when ambulating or moving her leg. She denies having any pain or discomfort when she is laying or sitting still. She notes her pain worsened last night. Denies taking any medications or applying ice/heat at home. Denies fever, chills, CP, SOB, abdominal pain, N/V/D, urinary sxs, back pain, urinary/bowel incontinence, saddle anesthesia, leg swelling, numbness, tingling, weakness. She notes she has left toe surgery 2 weeks ago. Denies any recent immobilizations, long car rides/flights, calf swelling.       Past Medical History:  Diagnosis Date  . Balance disorder 2008.     Falls a lot.  She can be standing and then leans too far over to one side   . Bulging disc   . Chronic bronchitis ([place])    due to congestion at times, on prednisone and advair  . COPD (chronic obstructive pulmonary disease) ([place])   . Depression    many years ago  . GERD (gastroesophageal reflux disease)   . Hearing loss    mild  . Hyperlipidemia   . Hypertension    followed by intern med Dr. Marisue Brooklyn [phone]  . Iatrogenic adrenal insufficiency ([place])   . Incontinence    not indicated at this visit.  Marland Kitchen Neuropathy ([facility])    legs stay numb   . Osteoarthritis    hands,   . Osteoporosis   . Shortness of breath dyspnea   . Tubulovillous adenoma of rectum   . Wears dentures   . Wears glasses     Patient Active Problem List   Diagnosis Date Noted  . At high risk for falls 10/16/2015  . Hypotension   . Asthma 11/12/2014  . Prediabetes 08/23/2014  . Vitamin D deficiency  08/23/2014  . Medication management 08/23/2014  . COPD 01/30/2014  . Unstable Gait 01/30/2014  . Polyp of rectum 09/09/2013  . UTI (lower urinary tract infection) 10/27/2012  . Incontinence   . Osteoarthritis   . Hyperlipidemia   . GERD (gastroesophageal reflux disease)   . Hereditary and idiopathic peripheral neuropathy   . Hypertension   . Iatrogenic adrenal insufficiency ([place])   . Tubulovillous adenoma of rectum 09/04/2011    Past Surgical History:  Procedure Laterality Date  . ABDOMINAL HYSTERECTOMY    . APPENDECTOMY    . CHONDROPLASTY  09/19/2014   Procedure: CHONDROPLASTY;  Surgeon: Alta Corning, MD;  Location: [facility];  Service: Orthopedics;;  . DILATION AND CURETTAGE OF UTERUS    . EXTERNAL FIXATION LEG  10/25/2012   Procedure: EXTERNAL FIXATION LEG;  Surgeon: Rozanna Box, MD;  Location: [facility];  Service: Orthopedics;  Laterality: Right;  . EYE SURGERY     bilateral cataract surgery and lens implant  . FINGER SURGERY     fusions and debridements for OA  . INCONTINENCE SURGERY     multiple procedures, not cured  . KNEE ARTHROSCOPY WITH LATERAL MENISECTOMY Right 09/19/2014   Procedure: KNEE ARTHROSCOPY WITH LATERAL MENISECTOMY;  Surgeon: Alta Corning, MD;  Location: [facility];  Service: Orthopedics;  Laterality: Right;  . KNEE ARTHROSCOPY WITH MEDIAL MENISECTOMY Right 09/19/2014   Procedure: RIGHT KNEE ARTHROSCOPY WITH MEDIAL AND LATERAL MENISECTOMIES. CHONDROPLASTY OF PATELLA-FEMORAL JOINT;  Surgeon: Alta Corning, MD;  Location: [facility];  Service: Orthopedics;  Laterality: Right;  . RECTAL BIOPSY  09/21/2011   Procedure: BIOPSY RECTAL;  Surgeon: Merrie Roof, MD;  Location: [facility];  Service: General;  Laterality: N/A;  3-4 cm  . RECTAL SURGERY     by dr. Marlou Starks, removal of polyp  . TONSILLECTOMY      OB History    No data available       Home Medications    Prior to Admission medications   Medication Sig  Start Date End Date Taking? Authorizing Provider  ADVAIR DISKUS 250-50 MCG/DOSE AEPB INHALE ONE DOSE BY MOUTH TWICE DAILY 04/06/16  Yes [name], MD  albuterol (PROVENTIL) (2.5 MG/3ML) 0.083% nebulizer solution Take 3 mLs (2.5 mg total) by nebulization every 6 (six) hours as needed for wheezing or shortness of breath. 11/21/14  Yes [name], MD  aspirin 81 MG tablet Take 81 mg by mouth daily. Buys OTC   Yes Historical Provider, MD  cholecalciferol (VITAMIN D) 1000 UNITS tablet Take 5 tablets (5,000 Units total) by mouth daily. 10/28/12  Yes Lezlie Octave Black, NP  clindamycin (CLEOCIN) 150 MG capsule Take one capsule by mouth twice daily. 06/24/16  Yes Max T Hyatt, DPM  furosemide (LASIX) 40 MG tablet TAKE 1 TABLET EVERY DAY Patient taking differently: TAKE 1/2 TABLET EVERY DAY 11/12/15  Yes [name], MD  Multiple Vitamin (MULTIVITAMIN) capsule Take 1 capsule by mouth daily.    Yes Historical Provider, MD  omeprazole (PRILOSEC) 40 MG capsule TAKE 1 CAPSULE TWICE DAILY  FOR  ACID  REFLUX 06/11/15  Yes Vicie Mutters, PA-C  oxybutynin (DITROPAN) 5 MG tablet Take 1 tablet (5 mg total) by mouth daily. 12/24/14  Yes [name], MD  pravastatin (PRAVACHOL) 40 MG tablet TAKE 1 TABLET EVERY DAY  FOR  CHOLESTEROL Patient taking differently: TAKE 1/2 TABLET EVERY DAY  FOR  CHOLESTEROL 06/11/15  Yes Vicie Mutters, PA-C  predniSONE (DELTASONE) 10 MG tablet Take 10 mg by mouth daily with breakfast.   Yes Historical Provider, MD  traZODone (DESYREL) 50 MG tablet 1/2-1 tablet for sleep 11/01/15  Yes Vicie Mutters, PA-C  amitriptyline (ELAVIL) 10 MG tablet TAKE 1 TABLET THREE TIMES DAILY Patient not taking: Reported on 07/08/2016 11/12/15   [name], MD  cephALEXin (KEFLEX) 500 MG capsule Take 1 capsule (500 mg total) by mouth 4 (four) times daily. 07/08/16   Nona Dell, PA-C  FLUoxetine (PROZAC) 40 MG capsule TAKE 1 CAPSULE EVERY DAY Patient not taking: Reported on 07/08/2016 11/12/15    [name], MD  traMADol (ULTRAM) 50 MG tablet Take 1 tablet (50 mg total) by mouth every 8 (eight) hours as needed. Patient not taking: Reported on 07/08/2016 06/24/16   Max Villa Herb, DPM    Family History Family History  Problem Relation Age of Onset  . Cancer Sister     pt unaware of what kind  . High blood pressure Mother   . COPD Mother   . Heart attack Father     Social History Social History  Substance Use Topics  . Smoking status: Never Smoker  . Smokeless tobacco: Never Used  . Alcohol use No     Allergies   Ertapenem; Codeine; Demerol;  Morphine and related; Other; and Sulfate   Review of Systems Review of Systems  Musculoskeletal: Positive for arthralgias (righ upper leg pain).  All other systems reviewed and are negative.    Physical Exam Updated Vital Signs BP 110/90 (BP Location: Left Arm)   Pulse 84   Temp 99 F (37.2 C) (Oral)   Resp 16   SpO2 95%   Physical Exam  Constitutional: She is oriented to person, place, and time. She appears well-developed and well-nourished. No distress.  HENT:  Head: Normocephalic and atraumatic.  Mouth/Throat: Oropharynx is clear and moist. No oropharyngeal exudate.  Eyes: Conjunctivae and EOM are normal. Right eye exhibits no discharge. Left eye exhibits no discharge. No scleral icterus.  Neck: Normal range of motion. Neck supple.  Cardiovascular: Normal rate, regular rhythm and intact distal pulses.   Murmur heard.  Systolic murmur is present  Pulmonary/Chest: Effort normal and breath sounds normal. No respiratory distress. She has no wheezes. She has no rales. She exhibits no tenderness.  Abdominal: Soft. Bowel sounds are normal. There is no tenderness.  No CVA tenderness.  Musculoskeletal: Normal range of motion. She exhibits tenderness. She exhibits no edema or deformity.       Right hip: She exhibits normal range of motion, normal strength, no tenderness, no swelling, no crepitus, no deformity and no  laceration.       Right upper leg: She exhibits tenderness (mild TTP over anterior proximal thigh). She exhibits no swelling, no edema, no deformity and no laceration.  No midline C, T, L tenderness. Full active and passive ROM of BLE. Pt endorses pain with flexion of right hip. No swelling, erythema, warmth noted. No calf swelling. Sensation grossly intact. 2+ DP pulse. Cap refill <2. Legs symmetric.   Neurological: She is alert and oriented to person, place, and time.  Skin: Skin is warm and dry. She is not diaphoretic.  Nursing note and vitals reviewed.    ED Treatments / Results  Labs (all labs ordered are listed, but only abnormal results are displayed) Labs Reviewed  URINALYSIS, ROUTINE W REFLEX MICROSCOPIC (NOT AT [facility]) - Abnormal; Notable for the following:       Result Value   APPearance CLOUDY (*)    Hgb urine dipstick TRACE (*)    Leukocytes, UA LARGE (*)    All other components within normal limits  URINE MICROSCOPIC-ADD ON - Abnormal; Notable for the following:    Squamous Epithelial / LPF 0-5 (*)    Bacteria, UA MANY (*)    All other components within normal limits  URINE CULTURE    EKG  EKG Interpretation None       Radiology Dg Hip Unilat W Or Wo Pelvis 2-3 Views Right  Result Date: 07/08/2016 CLINICAL DATA:  Right hip pain for 4 days, worse last night. Pain with movement. No known injury. EXAM: DG HIP (WITH OR WITHOUT PELVIS) 2-3V RIGHT COMPARISON:  None. FINDINGS: Degenerative changes in the lower lumbar spine and in both hips. Pelvis and right hip appear otherwise intact. No evidence of acute fracture or dislocation. No focal bone lesion or bone erosion. SI joints and symphysis pubis are not displaced. Calcified phleboliths in the pelvis. IMPRESSION: Mild degenerative changes in the right hip. No acute bony abnormalities. Electronically Signed   By: Lucienne Capers M.D.   On: 07/08/2016 06:54   Dg Femur Min 2 Views Right  Result Date: 07/08/2016 CLINICAL  DATA:  No known injury, c/o pain right hip x 4 days,  pain became worse last night, unable to move leg without pain. Unable to stand. EXAM: RIGHT FEMUR 2 VIEWS COMPARISON:  None. FINDINGS: Mild degenerative changes in the right hip and in the right knee. No evidence of acute fracture or dislocation of the right femur. No focal bone lesion or bone destruction. No significant effusion. Vascular calcifications. IMPRESSION: Mild degenerative changes in the right hip and right knee. No acute bony abnormalities. Electronically Signed   By: Lucienne Capers M.D.   On: 07/08/2016 06:55    Procedures Procedures (including critical care time)  Medications Ordered in ED Medications  ketorolac (TORADOL) 30 MG/ML injection 15 mg (15 mg Intravenous Given 07/08/16 0711)     Initial Impression / Assessment and Plan / ED Course  I have reviewed the triage vital signs and the nursing notes.  Pertinent labs & imaging results that were available during my care of the patient were reviewed by me and considered in my medical decision making (see chart for details).  Clinical Course    Patient presents with right upper thigh pain that started 5 days ago, pain only occurs with movement. Denies any recent fall, trauma or injury. VSS. Exam revealed mild tenderness over right anterior proximal thigh, reports pain with flexion of right hip, bilateral lower extremities neurovascularly intact. No leg swelling noted. No abdominal tenderness. No signs or symptoms concerning for blood clot. Right femur and hip x-ray showed mild degenerative changes. UA consistent with UTI, urine culture sent. Exam unconcerning for kidney stone. Patient given Toradol and ice pack. On reevaluation patient is sleeping resting comfortably in her bed. She denies any pain or complaints at this time but reports mild discomfort with movement of her right thigh. Pt able to stand and ambulate. Suspect patient's symptoms are likely musculoskeletal in  etiology. Discussed results and plan for discharge with patient. Plan to discharge patient home with Keflex for UTI and sent treatment. Discussed return precautions with patient.  Final Clinical Impressions(s) / ED Diagnoses   Final diagnoses:  Right leg pain  UTI (lower urinary tract infection)    New Prescriptions Discharge Medication List as of 07/08/2016 10:13 AM    START taking these medications   Details  cephALEXin (KEFLEX) 500 MG capsule Take 1 capsule (500 mg total) by mouth 4 (four) times daily., Starting Wed 07/08/2016, Print         Nona Dell, [place] 07/08/16 1039    April Palumbo, MD 07/08/16 [phone]

## 2016-07-08 NOTE — Discharge Instructions (Signed)
I recommend applying ice and/or heat to affected area for 15-20 minutes 3-4 times daily for pain relief. He may also take Tylenol or ibuprofen as prescribed over-the-counter as needed for pain relief. I recommend following up with your primary care provider in the next 3-5 days if your symptoms have not improved. Please return to the Emergency Department if symptoms worsen or new onset of fever, abdominal pain, nausea, vomiting, leg swelling, numbness, tingling, weakness.

## 2016-07-08 NOTE — ED Triage Notes (Addendum)
Pt BIB EMS from home c/o right leg pain that starts at the hip and travels down the leg; reports this pain began this past Saturday and worsened this morning at 2 AM; leg is not painful to palpation, only with movement; recent left foot surgery; pt usually walks with some balance issues.

## 2016-07-08 NOTE — ED Notes (Signed)
Bed: [id number] Expected date:  Expected time:  Means of arrival:  Comments: EMS 80 yo female right leg pain

## 2016-07-09 ENCOUNTER — Ambulatory Visit (INDEPENDENT_AMBULATORY_CARE_PROVIDER_SITE_OTHER): Payer: Medicare Other | Admitting: Podiatry

## 2016-07-09 VITALS — BP 95/59 | HR 80 | Temp 98.3°F

## 2016-07-09 DIAGNOSIS — Z9889 Other specified postprocedural states: Secondary | ICD-10-CM

## 2016-07-09 DIAGNOSIS — M2042 Other hammer toe(s) (acquired), left foot: Secondary | ICD-10-CM

## 2016-07-09 LAB — URINE CULTURE

## 2016-07-09 NOTE — Progress Notes (Signed)
She presents today 2 weeks status post amputation third digit left foot. She states that she feels great and has had no pain whatsoever.  Objective: Vital signs stable she is alert and oriented 3. I see no signs of infection.  Assessment: Healing surgical foot.  Plan: Follow-up with her in 2 weeks.

## 2016-07-13 ENCOUNTER — Ambulatory Visit (INDEPENDENT_AMBULATORY_CARE_PROVIDER_SITE_OTHER): Payer: Medicare Other | Admitting: Physician Assistant

## 2016-07-13 ENCOUNTER — Encounter: Payer: Self-pay | Admitting: Physician Assistant

## 2016-07-13 VITALS — BP 116/60 | HR 70 | Temp 97.9°F | Resp 14 | Ht 64.0 in | Wt 177.0 lb

## 2016-07-13 DIAGNOSIS — M79601 Pain in right arm: Secondary | ICD-10-CM

## 2016-07-13 DIAGNOSIS — M79604 Pain in right leg: Secondary | ICD-10-CM | POA: Diagnosis not present

## 2016-07-13 LAB — CBC WITH DIFFERENTIAL/PLATELET
BASOS PCT: 1 %
Basophils Absolute: 89 cells/uL (ref 0–200)
EOS ABS: 89 {cells}/uL (ref 15–500)
Eosinophils Relative: 1 %
HEMATOCRIT: 36.8 % (ref 35.0–45.0)
Hemoglobin: 12.4 g/dL (ref 11.7–15.5)
LYMPHS PCT: 11 %
Lymphs Abs: 979 cells/uL (ref 850–3900)
MCH: 31.2 pg (ref 27.0–33.0)
MCHC: 33.7 g/dL (ref 32.0–36.0)
MCV: 92.7 fL (ref 80.0–100.0)
MPV: 10.2 fL (ref 7.5–12.5)
Monocytes Absolute: 356 cells/uL (ref 200–950)
Monocytes Relative: 4 %
NEUTROS PCT: 83 %
Neutro Abs: 7387 cells/uL (ref 1500–7800)
PLATELETS: 262 10*3/uL (ref 140–400)
RBC: 3.97 MIL/uL (ref 3.80–5.10)
RDW: 14 % (ref 11.0–15.0)
WBC: 8.9 10*3/uL (ref 3.8–10.8)

## 2016-07-13 LAB — HEPATIC FUNCTION PANEL
ALBUMIN: 3.5 g/dL — AB (ref 3.6–5.1)
ALK PHOS: 37 U/L (ref 33–130)
ALT: 16 U/L (ref 6–29)
AST: 20 U/L (ref 10–35)
BILIRUBIN TOTAL: 0.7 mg/dL (ref 0.2–1.2)
Bilirubin, Direct: 0.2 mg/dL (ref <=0.2)
Indirect Bilirubin: 0.5 mg/dL (ref 0.2–1.2)
TOTAL PROTEIN: 5.6 g/dL — AB (ref 6.1–8.1)

## 2016-07-13 LAB — BASIC METABOLIC PANEL WITH GFR
BUN: 21 mg/dL (ref 7–25)
CO2: 29 mmol/L (ref 20–31)
CREATININE: 1.13 mg/dL — AB (ref 0.60–0.93)
Calcium: 8.5 mg/dL — ABNORMAL LOW (ref 8.6–10.4)
Chloride: 101 mmol/L (ref 98–110)
GFR, EST AFRICAN AMERICAN: 54 mL/min — AB (ref >=60)
GFR, Est Non African American: 47 mL/min — ABNORMAL LOW (ref >=60)
GLUCOSE: 123 mg/dL — AB (ref 65–99)
Potassium: 3.3 mmol/L — ABNORMAL LOW (ref 3.5–5.3)
Sodium: 140 mmol/L (ref 135–146)

## 2016-07-13 LAB — CK: CK TOTAL: 73 U/L (ref 7–177)

## 2016-07-13 MED ORDER — PREDNISONE 20 MG PO TABS: ORAL_TABLET | ORAL | 0 refills | Status: AC

## 2016-07-13 NOTE — Progress Notes (Signed)
Subjective:    Patient ID: Marie [name], female    DOB: August 09, 1936, 80 y.o.   MRN: [id number]  HPI 80 y.o. WF with history of HTN, COPD, OA presents with right leg pain x 2 weeks. She went to the Er on 09/20, had xray right hip and femur. She had mild OA right knee, and right hip but otherwise normal. Also had + UTI.  Right lower AB pain radiating down her right leg to her knee. She states it is worse than child birth.  Worse with lifting right thigh, worse with lifting her legs into bed, worse with movement, better with rest.  She has been taking 2 gabapentin 100mg  3 times a day, states she can raise her foot higher, has been on it since Saturday, some improvement  Blood pressure 116/60, pulse 70, temperature 97.9 F (36.6 C), resp. rate 14, height 5\' 4"  (1.626 m), weight 177 lb (80.3 kg), SpO2 96 %.  Medications Current Outpatient Prescriptions on File Prior to Visit  Medication Sig  . ADVAIR DISKUS 250-50 MCG/DOSE AEPB INHALE ONE DOSE BY MOUTH TWICE DAILY  . albuterol (PROVENTIL) (2.5 MG/3ML) 0.083% nebulizer solution Take 3 mLs (2.5 mg total) by nebulization every 6 (six) hours as needed for wheezing or shortness of breath.  Marland Kitchen aspirin 81 MG tablet Take 81 mg by mouth daily. Buys OTC  . cephALEXin (KEFLEX) 500 MG capsule Take 1 capsule (500 mg total) by mouth 4 (four) times daily.  . cholecalciferol (VITAMIN D) 1000 UNITS tablet Take 5 tablets (5,000 Units total) by mouth daily.  . clindamycin (CLEOCIN) 150 MG capsule Take one capsule by mouth twice daily.  Marland Kitchen FLUoxetine (PROZAC) 40 MG capsule TAKE 1 CAPSULE EVERY DAY  . furosemide (LASIX) 40 MG tablet TAKE 1 TABLET EVERY DAY (Patient taking differently: TAKE 1/2 TABLET EVERY DAY)  . Multiple Vitamin (MULTIVITAMIN) capsule Take 1 capsule by mouth daily.   Marland Kitchen omeprazole (PRILOSEC) 40 MG capsule TAKE 1 CAPSULE TWICE DAILY  FOR  ACID  REFLUX  . oxybutynin (DITROPAN) 5 MG tablet Take 1 tablet (5 mg total) by mouth daily.  . pravastatin  (PRAVACHOL) 40 MG tablet TAKE 1 TABLET EVERY DAY  FOR  CHOLESTEROL (Patient taking differently: TAKE 1/2 TABLET EVERY DAY  FOR  CHOLESTEROL)  . predniSONE (DELTASONE) 10 MG tablet Take 10 mg by mouth daily with breakfast.  . traZODone (DESYREL) 50 MG tablet 1/2-1 tablet for sleep   No current facility-administered medications on file prior to visit.     Problem list She has Tubulovillous adenoma of rectum; Incontinence; Osteoarthritis; Hyperlipidemia; GERD (gastroesophageal reflux disease); Hereditary and idiopathic peripheral neuropathy; Hypertension; Iatrogenic adrenal insufficiency (H[facility]); UTI (lower urinary tract infection); COPD; Unstable Gait; Prediabetes; Vitamin D deficiency; Medication management; Asthma; Hypotension; Polyp of rectum; and At high risk for falls on her problem list.  Review of Systems  Constitutional: Negative for chills, fatigue and fever.  Respiratory: Negative for chest tightness and shortness of breath.   Cardiovascular: Negative for chest pain.  Gastrointestinal: Negative for abdominal pain, nausea and vomiting.  Genitourinary: Negative for dysuria, frequency and urgency.  Musculoskeletal: Positive for arthralgias and gait problem. Negative for back pain.  Neurological: Negative for numbness.       Objective:   Physical Exam  Constitutional: She is oriented to person, place, and time. She appears well-developed and well-nourished. No distress.  HENT:  Head: Normocephalic and atraumatic.  Mouth/Throat: Oropharynx is clear and moist. No oropharyngeal exudate.  Eyes: Conjunctivae and EOM  are normal. Pupils are equal, round, and reactive to light. No scleral icterus.  Neck: Normal range of motion. Neck supple. No JVD present. No thyromegaly present.  Cardiovascular: Normal rate and regular rhythm.  Exam reveals no gallop and no friction rub.   Murmur heard. Pulmonary/Chest: Effort normal and breath sounds normal. No respiratory distress. She has no wheezes. She  has no rales. She exhibits no tenderness.  Abdominal: Soft. Bowel sounds are normal. She exhibits no distension and no mass. There is no tenderness. There is no rebound and no guarding.  Musculoskeletal: She exhibits no deformity.  Pain with palpation of right thigh/IT band, pain with flexion of right hip, no pain with abduction, negative straight leg raise (pain in her leg),  no noticeable weakness between right and left leg, normal sensation bilateral legs. Antalgic gait with walker.   Lymphadenopathy:    She has no cervical adenopathy.  Neurological: She is alert and oriented to person, place, and time.  Skin: Skin is warm and dry. She is not diaphoretic.  Psychiatric: She has a normal mood and affect. Her behavior is normal. Judgment and thought content normal.  Vitals reviewed.      Assessment & Plan:  Right thigh pain- ? It band, versus neuropathy versus lumbar Will do prednisone taper Increase gabapentin to 3 pills 3 x a day Will refer to ortho, has seen Dr. Berenice Primas in the past

## 2016-07-13 NOTE — Patient Instructions (Addendum)
Please call Dr. Berenice Primas  [phone];  Make appointment for right leg pain  If you have worsening pain, weakness in that leg, unable to stand go back to the ER  Take the gabapentin 300mg  up to 3 times a day for the leg pain Do the prednisone taper and then continue back on your regular dose of prednisone

## 2016-07-14 ENCOUNTER — Other Ambulatory Visit: Payer: Self-pay | Admitting: Physician Assistant

## 2016-07-14 DIAGNOSIS — M7061 Trochanteric bursitis, right hip: Secondary | ICD-10-CM | POA: Diagnosis not present

## 2016-07-14 MED ORDER — POTASSIUM CHLORIDE CRYS ER 20 MEQ PO TBCR: 20.0000 meq | EXTENDED_RELEASE_TABLET | Freq: Two times a day (BID) | ORAL | 1 refills | Status: DC

## 2016-07-15 ENCOUNTER — Telehealth: Payer: Self-pay

## 2016-07-15 NOTE — Telephone Encounter (Signed)
INFORMED PT OF HER OCT/2017. PT AGREED & SEEMED HAPPY WITH THAT INFORMATION.

## 2016-07-23 ENCOUNTER — Other Ambulatory Visit: Payer: Medicare Other

## 2016-07-24 ENCOUNTER — Other Ambulatory Visit: Payer: Self-pay | Admitting: Internal Medicine

## 2016-07-28 ENCOUNTER — Encounter: Payer: Self-pay | Admitting: Podiatry

## 2016-07-28 ENCOUNTER — Ambulatory Visit (INDEPENDENT_AMBULATORY_CARE_PROVIDER_SITE_OTHER): Payer: Medicare Other | Admitting: Podiatry

## 2016-07-28 DIAGNOSIS — M2042 Other hammer toe(s) (acquired), left foot: Secondary | ICD-10-CM

## 2016-07-28 DIAGNOSIS — Z9889 Other specified postprocedural states: Secondary | ICD-10-CM

## 2016-07-29 ENCOUNTER — Ambulatory Visit: Payer: Self-pay | Admitting: Physician Assistant

## 2016-07-29 NOTE — Progress Notes (Signed)
She presents today for her final postop visit status post amputation PIPJ third digit left foot. She states that she is doing wonderful she has had absolutely 0 pain since the time of surgery.  Objective: Vital signs are stable she is alert and oriented 3 she is very happy and has no pain on palpation of the foot whatsoever. There is no signs of infection in that there is no erythema saline as drainage or odor. Skin is closed 100% signs of infection.  Assessment: Well-healed surgical toe third left.  Plan: Follow up with [place] as needed.

## 2016-08-11 ENCOUNTER — Other Ambulatory Visit: Payer: Self-pay | Admitting: Internal Medicine

## 2016-08-13 ENCOUNTER — Ambulatory Visit (INDEPENDENT_AMBULATORY_CARE_PROVIDER_SITE_OTHER): Payer: Medicare Other | Admitting: Physician Assistant

## 2016-08-13 ENCOUNTER — Encounter: Payer: Self-pay | Admitting: Physician Assistant

## 2016-08-13 VITALS — BP 108/64 | HR 83 | Temp 97.7°F | Resp 14 | Wt 169.0 lb

## 2016-08-13 DIAGNOSIS — I10 Essential (primary) hypertension: Secondary | ICD-10-CM

## 2016-08-13 DIAGNOSIS — E785 Hyperlipidemia, unspecified: Secondary | ICD-10-CM

## 2016-08-13 DIAGNOSIS — Z79899 Other long term (current) drug therapy: Secondary | ICD-10-CM

## 2016-08-13 DIAGNOSIS — E559 Vitamin D deficiency, unspecified: Secondary | ICD-10-CM | POA: Diagnosis not present

## 2016-08-13 DIAGNOSIS — Z23 Encounter for immunization: Secondary | ICD-10-CM

## 2016-08-13 DIAGNOSIS — R7303 Prediabetes: Secondary | ICD-10-CM | POA: Diagnosis not present

## 2016-08-13 DIAGNOSIS — J449 Chronic obstructive pulmonary disease, unspecified: Secondary | ICD-10-CM

## 2016-08-13 DIAGNOSIS — M7061 Trochanteric bursitis, right hip: Secondary | ICD-10-CM | POA: Diagnosis not present

## 2016-08-13 DIAGNOSIS — J4489 Other specified chronic obstructive pulmonary disease: Secondary | ICD-10-CM

## 2016-08-13 LAB — CBC WITH DIFFERENTIAL/PLATELET
BASOS PCT: 1 %
Basophils Absolute: 55 cells/uL (ref 0–200)
EOS ABS: 275 {cells}/uL (ref 15–500)
Eosinophils Relative: 5 %
HEMATOCRIT: 44 % (ref 35.0–45.0)
HEMOGLOBIN: 14.7 g/dL (ref 11.7–15.5)
LYMPHS ABS: 1925 {cells}/uL (ref 850–3900)
Lymphocytes Relative: 35 %
MCH: 31.3 pg (ref 27.0–33.0)
MCHC: 33.4 g/dL (ref 32.0–36.0)
MCV: 93.6 fL (ref 80.0–100.0)
MONO ABS: 605 {cells}/uL (ref 200–950)
MPV: 10.1 fL (ref 7.5–12.5)
Monocytes Relative: 11 %
NEUTROS ABS: 2640 {cells}/uL (ref 1500–7800)
Neutrophils Relative %: 48 %
PLATELETS: 303 10*3/uL (ref 140–400)
RBC: 4.7 MIL/uL (ref 3.80–5.10)
RDW: 13.6 % (ref 11.0–15.0)
WBC: 5.5 10*3/uL (ref 3.8–10.8)

## 2016-08-13 LAB — HEPATIC FUNCTION PANEL
ALBUMIN: 3.5 g/dL — AB (ref 3.6–5.1)
ALK PHOS: 48 U/L (ref 33–130)
ALT: 14 U/L (ref 6–29)
AST: 20 U/L (ref 10–35)
BILIRUBIN TOTAL: 0.8 mg/dL (ref 0.2–1.2)
Bilirubin, Direct: 0.2 mg/dL (ref <=0.2)
Indirect Bilirubin: 0.6 mg/dL (ref 0.2–1.2)
Total Protein: 5.7 g/dL — ABNORMAL LOW (ref 6.1–8.1)

## 2016-08-13 LAB — BASIC METABOLIC PANEL WITH GFR
BUN: 18 mg/dL (ref 7–25)
CHLORIDE: 101 mmol/L (ref 98–110)
CO2: 30 mmol/L (ref 20–31)
Calcium: 9.2 mg/dL (ref 8.6–10.4)
Creat: 1.2 mg/dL — ABNORMAL HIGH (ref 0.60–0.93)
GFR, Est African American: 50 mL/min — ABNORMAL LOW (ref >=60)
GFR, Est Non African American: 43 mL/min — ABNORMAL LOW (ref >=60)
GLUCOSE: 115 mg/dL — AB (ref 65–99)
POTASSIUM: 3.4 mmol/L — AB (ref 3.5–5.3)
Sodium: 143 mmol/L (ref 135–146)

## 2016-08-13 LAB — MAGNESIUM: Magnesium: 2 mg/dL (ref 1.5–2.5)

## 2016-08-13 LAB — LIPID PANEL
Cholesterol: 150 mg/dL (ref 125–200)
HDL: 50 mg/dL (ref >=46)
LDL CALC: 60 mg/dL (ref <130)
Total CHOL/HDL Ratio: 3 Ratio (ref <=5.0)
Triglycerides: 201 mg/dL — ABNORMAL HIGH (ref <150)
VLDL: 40 mg/dL — ABNORMAL HIGH (ref <30)

## 2016-08-13 LAB — HEMOGLOBIN A1C
HEMOGLOBIN A1C: 5.4 % (ref <5.7)
Mean Plasma Glucose: 108 mg/dL

## 2016-08-13 LAB — TSH: TSH: 1.99 mIU/L

## 2016-08-13 NOTE — Patient Instructions (Signed)
Potassium Content of Foods  The body needs potassium to control blood pressure and to keep the muscles and nervous system healthy. Here are some healthy foods below that are high in potassium. Also you can get the white label salt of "NO SALT" salt substitute, 1/4 teaspoon of this is equivalent to 20meq potassium.   FOODS AND DRINKS HIGH IN POTASSIUM FOODS MODERATE IN POTASSIUM   Fruits Avocado (cubed),  c / 50 g. Banana (sliced), 75 g. Cantaloupe (cubed), 80 g. Honeydew, 1 wedge / 85 g. Kiwi (sliced), 90 g. Nectarine, 1 small / 129 g. Orange, 1 medium / 131 g. Vegetables Artichoke,  of a medium / 64 g. Asparagus (boiled), 90 g.. Broccoli (boiled), 78 g. Brussels sprout (boiled), 78 g. Butternut squash (baked), 103 g. Chickpea (cooked), 82 g. Green peas (cooked), 80 g. Kidney beans (cooked), 5 tbsp / 55 g. Lima beans (cooked),  c / 43 g. Navy beans (cooked),  c / 61 g. Spinach (cooked),  c / 45 g. Sweet potato (baked),  c / 50 g. Tomato (chopped or sliced), 90 g. Vegetable juice. White mushrooms (cooked), 78 g. Yam (cooked or baked),  c / 34 g. Zucchini squash (boiled), 90 g. Other Foods and Drinks Almonds (whole),  c / 36 g. Fish, 3 oz / 85 g. Nonfat fruit variety yogurt, 123 g. Pistachio nuts, 1 oz / 28 g. Pumpkin seeds, 1 oz / 28 g. Red meat (broiled, cooked, grilled), 3 oz / 85 g. Scallops (steamed), 3 oz / 85 g. Spaghetti sauce,  c / 66 g. Sunflower seeds (dry roasted), 1 oz / 28 g. Veggie burger, 1 patty / 70 g. Fruits Grapefruit,  of the fruit / 123 g Plums (sliced), 83 g. Tangerine, 1 large / 120 g. Vegetables Carrots (boiled), 78 g. Carrots (sliced), 61 g. Rhubarb (cooked with sugar), 120 g. Rutabaga (cooked), 120 g. Yellow snap beans (cooked), 63 g. Other Foods and Drinks  Chicken breast (roasted and chopped),  c / 70 g. Pita bread, 1 large / 64 g. Shrimp (steamed), 4 oz / 113 g. Swiss cheese (diced), 70 g.     

## 2016-08-13 NOTE — Progress Notes (Signed)
Patient ID: Marie [name], female   DOB: September 15, 1936, 80 y.o.   MRN: [id number]  Assessment and Plan:  Hypertension:  -Continue medication,  -monitor blood pressure at home.  -Continue DASH diet.   -Reminder to go to the ER if any CP, SOB, nausea, dizziness, severe HA, changes vision/speech, left arm numbness and tingling, and jaw pain.  Cholesterol: -Continue diet and exercise.  -Check cholesterol.   Pre-diabetes: -Continue diet and exercise.  -Check A1C  Vitamin D Def: -check level -continue medications.   Hypokalemia Continue pill, check labs, leg pain resolved  Continue diet and meds as discussed. Further disposition pending results of labs.  HPI 80 y.o. female  presents for 3 month follow up with hypertension, hyperlipidemia, prediabetes and vitamin D.  She is also here for 1 month recheck of her potassium/Mag, last visit she was having leg pain, had normal xray hip and leg. She was given prednisone taper and gabapentin, she states she is no longer on either medication and her leg pain has resolved. [name], her husband is with her today, normally walks with a walker. She is on potassium once a day.  Lab Results  Component Value Date   CREATININE 1.13 (H) 07/13/2016   BUN 21 07/13/2016   NA 140 07/13/2016   K 3.3 (L) 07/13/2016   CL 101 07/13/2016   CO2 29 07/13/2016    Her blood pressure has been controlled at home, today their BP is BP: 108/64.   She does workout. She denies chest pain, shortness of breath, dizziness.  She is on cholesterol medication and denies myalgias. Her cholesterol is at goal. The cholesterol last visit was:   Lab Results  Component Value Date   CHOL 146 04/28/2016   HDL 76 04/28/2016   LDLCALC 48 04/28/2016   TRIG 112 04/28/2016   CHOLHDL 1.9 04/28/2016    She has been working on diet and exercise for prediabetes, and denies foot ulcerations, hyperglycemia, hypoglycemia , increased appetite, nausea, paresthesia of the feet, polydipsia,  polyuria, visual disturbances, vomiting and weight loss. Last A1C in the office was:  Lab Results  Component Value Date   HGBA1C 5.7 (H) 04/28/2016    Patient is on Vitamin D supplement.  Lab Results  Component Value Date   VD25OH 66 04/28/2016      Current Medications:  Current Outpatient Prescriptions on File Prior to Visit  Medication Sig Dispense Refill  . ADVAIR DISKUS 250-50 MCG/DOSE AEPB INHALE ONE DOSE BY MOUTH TWICE DAILY 60 each 1  . albuterol (PROVENTIL) (2.5 MG/3ML) 0.083% nebulizer solution Take 3 mLs (2.5 mg total) by nebulization every 6 (six) hours as needed for wheezing or shortness of breath. 75 mL 4  . aspirin 81 MG tablet Take 81 mg by mouth daily. Buys OTC    . cholecalciferol (VITAMIN D) 1000 UNITS tablet Take 5 tablets (5,000 Units total) by mouth daily.    Marland Kitchen FLUoxetine (PROZAC) 40 MG capsule TAKE 1 CAPSULE EVERY DAY 90 capsule 3  . furosemide (LASIX) 40 MG tablet TAKE 1 TABLET EVERY DAY 90 tablet 3  . Multiple Vitamin (MULTIVITAMIN) capsule Take 1 capsule by mouth daily.     Marland Kitchen nystatin (MYCOSTATIN) 100000 UNIT/ML suspension SWISH AND SPIT  5 ML BY MOUTH 4 TIMES DAILY FOR 2 DAYS AFTER SYMPTOMS RESOLVE 240 mL 3  . omeprazole (PRILOSEC) 40 MG capsule TAKE 1 CAPSULE TWICE DAILY  FOR  ACID  REFLUX 180 capsule 11  . oxybutynin (DITROPAN) 5 MG tablet Take 1  tablet (5 mg total) by mouth daily. 90 tablet 4  . potassium chloride SA (K-DUR,KLOR-CON) 20 MEQ tablet Take 1 tablet (20 mEq total) by mouth 2 (two) times daily. 60 tablet 1  . pravastatin (PRAVACHOL) 40 MG tablet TAKE 1 TABLET EVERY DAY  FOR  CHOLESTEROL (Patient taking differently: TAKE 1/2 TABLET EVERY DAY  FOR  CHOLESTEROL) 90 tablet 11  . predniSONE (DELTASONE) 10 MG tablet TAKE 1 TABLET EVERY DAY WITH BREAKFAST 90 tablet 2  . traZODone (DESYREL) 50 MG tablet 1/2-1 tablet for sleep 30 tablet 2   No current facility-administered medications on file prior to visit.     Medical History:  Past Medical History:   Diagnosis Date  . Balance disorder 2008.     Falls a lot.  She can be standing and then leans too far over to one side   . Bulging disc   . Chronic bronchitis ([place])    due to congestion at times, on prednisone and advair  . COPD (chronic obstructive pulmonary disease) ([place])   . Depression    many years ago  . GERD (gastroesophageal reflux disease)   . Hearing loss    mild  . Hyperlipidemia   . Hypertension    followed by intern med Dr. Marisue Brooklyn [phone]  . Iatrogenic adrenal insufficiency ([place])   . Incontinence    not indicated at this visit.  Marland Kitchen Neuropathy ([facility])    legs stay numb   . Osteoarthritis    hands,   . Osteoporosis   . Shortness of breath dyspnea   . Tubulovillous adenoma of rectum   . Wears dentures   . Wears glasses     Allergies:  Allergies  Allergen Reactions  . Ertapenem Hives    Caused whole body to turn red Other reaction(s): Redness Caused whole body to turn red  . Codeine Other (See Comments)    hallucinations  . Demerol Other (See Comments)    hallucinations  . Morphine And Related Other (See Comments)    hallucinations  . Other Other (See Comments)    Invan 7- pt became red all over  . Sulfate Other (See Comments)    unknown     Review of Systems:  Review of Systems  Constitutional: Negative for chills, fever and malaise/fatigue.  HENT: Negative for congestion, ear pain and sore throat.   Respiratory: Negative for cough, shortness of breath and wheezing.   Cardiovascular: Positive for leg swelling (leg swelling which does not go away over night). Negative for chest pain and palpitations.  Gastrointestinal: Negative for blood in stool, constipation, diarrhea, heartburn and melena.  Genitourinary: Negative for dysuria, frequency, hematuria and urgency.  Neurological: Negative for dizziness, sensory change, loss of consciousness and headaches.  Psychiatric/Behavioral: Negative for depression. The patient is not nervous/anxious and does not  have insomnia.     Family history- Review and unchanged  Social history- Review and unchanged  Physical Exam: BP 108/64   Pulse 83   Temp 97.7 F (36.5 C)   Resp 14   Wt 169 lb (76.7 kg)   SpO2 97%   BMI 29.01 kg/m  Wt Readings from Last 3 Encounters:  08/13/16 169 lb (76.7 kg)  07/13/16 177 lb (80.3 kg)  06/23/16 175 lb (79.4 kg)    General Appearance: Well nourished well developed, in no apparent distress. Eyes: PERRLA, EOMs, conjunctiva no swelling or erythema ENT/Mouth: Ear canals normal without obstruction, swelling, erythma, discharge.  TMs normal bilaterally.  Oropharynx moist, clear, without  exudate, or postoropharyngeal swelling. Neck: Supple, thyroid normal,no cervical adenopathy  Respiratory: Respiratory effort normal, Breath sounds clear A&P without rhonchi, wheeze, or rale.  No retractions, no accessory usage. Cardio: RRR with no MRGs. Brisk peripheral pulses without edema.  Abdomen: Soft, + BS,  Non tender, no guarding, rebound, hernias, masses. Musculoskeletal: Full ROM, 5/5 strength, very unstable gait without walker today.   Skin: Warm, dry without rashes, lesions, ecchymosis.  Neuro: Awake and oriented X 3, Cranial nerves intact. Normal muscle tone, no cerebellar symptoms. Psych: Normal affect, Insight and Judgment appropriate.    Vicie Mutters, PA-C 8:36 AM [facility]

## 2016-08-28 ENCOUNTER — Ambulatory Visit (INDEPENDENT_AMBULATORY_CARE_PROVIDER_SITE_OTHER): Payer: Medicare Other | Admitting: Internal Medicine

## 2016-08-28 ENCOUNTER — Encounter: Payer: Self-pay | Admitting: Internal Medicine

## 2016-08-28 VITALS — BP 126/68 | HR 76 | Temp 97.3°F | Resp 16 | Ht 64.0 in | Wt 169.2 lb

## 2016-08-28 DIAGNOSIS — J209 Acute bronchitis, unspecified: Secondary | ICD-10-CM

## 2016-08-28 DIAGNOSIS — J449 Chronic obstructive pulmonary disease, unspecified: Secondary | ICD-10-CM | POA: Diagnosis not present

## 2016-08-28 MED ORDER — PREDNISONE 20 MG PO TABS: ORAL_TABLET | ORAL | 0 refills | Status: DC

## 2016-08-28 MED ORDER — LEVOFLOXACIN 500 MG PO TABS: ORAL_TABLET | ORAL | 1 refills | Status: DC

## 2016-08-28 NOTE — Progress Notes (Signed)
[facility]   [name], M.D.    [name]. Silverio Lay, P.A.-C      Starlyn Skeans, P.A.-C  [facility]                [address] [place] [id number] Telephone [phone] Telefax [phone] Subjective:    Patient ID: Marie [name], female    DOB: [birth year], 80 y.o.   MRN: [id number]  HPI  This very nice 80 yo MWF with HTN, HLD, preDM and severe COPD presents emergently for c/o exacerbation of her chest congestion & wheezing since last night. Says home O2 sat's are ~mid 90's and she denies CP, dyspnea, rash, fever, chills or sweats.   Medication Sig  . ADVAIR DISKUS 250-50  INHALE ONE DOSE BY MOUTH TWICE DAILY  . albuterol  (2.5 MG/3ML) 0.083% neb soln Take 3 mLs  by neb every 6  hrs as needed   . aspirin 81 MG tablet Take 81 mg by mouth daily. Buys OTC  . VITAMIN D 1000 UNITS tablet Take 5,000 Units  daily.  Marland Kitchen FLUoxetine (PROZAC) 40 MG capsule TAKE 1 CAP EVERY DAY  .  Lasix 40 MG tablet TAKE 1 TAB EVERY DAY  . Multiple Vitamin  Take 1 caps daily.   Marland Kitchen omeprazole  40 MG capsule TAKE 1 CAPSULE TWICE DAILY  FOR  ACID  REFLUX  . oxybutynin  5 MG tablet Take 1 tablet (5 mg total) by mouth daily.  Marland Kitchen K-DUR 20 MEQ  Take 1 tablet (20 mEq total) by mouth 2 (two) times daily.  . pravastatin 40 MG tablet TAKE 1/2 TABLET EVERY DAY  FOR  CHOLESTEROL)  . predniSONE 10 MG TAKE 1 TABLET EVERY DAY WITH BREAKFAST  . traZODone  50 MG tablet 1/2-1 tablet for sleep   Allergies  Allergen Reactions  . Ertapenem Hives    Caused whole body to turn red Other reaction(s): Redness Caused whole body to turn red  . Codeine Other (See Comments)    hallucinations  . Demerol Other (See Comments)    hallucinations  . Morphine And Related Other (See Comments)    hallucinations  . Other Other (See Comments)    Invan 7- pt became red all over  . Sulfate Other (See Comments)    unknown   Past Medical History:   Diagnosis Date  . Balance disorder 2008.     Falls a lot.  She can be standing and then leans too far over to one side   . Bulging disc   . Chronic bronchitis ([place])    due to congestion at times, on prednisone and advair  . COPD (chronic obstructive pulmonary disease) ([place])   . Depression    many years ago  . GERD (gastroesophageal reflux disease)   . Hearing loss    mild  . Hyperlipidemia   . Hypertension    followed by intern med Dr. Marisue Brooklyn [phone]  . Iatrogenic adrenal insufficiency ([place])   . Incontinence    not indicated at this visit.  Marland Kitchen Neuropathy ([facility])    legs stay numb   . Osteoarthritis    hands,   . Osteoporosis   . Shortness of breath dyspnea   . Tubulovillous adenoma of rectum   . Wears dentures   . Wears glasses    Review of Systems  10 point systems review negative  except as above.    Objective:   Physical Exam  BP 126/68   P 76   T 97.3 F    R 16   Ht 5\' 4"     Wt 169 lb 3.2 oz  BMI 29.04O2 sat 95-96%  No Stridor. Hoarse . Dry hacking cough.  HEENT - Eac's patent. TM's Nl. EOM's full. PERRLA. NasoOroPharynx clear. Neck - supple. Nl Thyroid. Carotids 2+ & No bruits, nodes, JVD Chest - BS decreased with few scattered rales and no rhonchi or wheezes. Cor - Nl HS. RRR w/o sig MGR. PP 1(+). No edema. MS- FROM w/o deformities. Muscle power, tone and bulk Nl. Gait Nl. Neuro - No obvious Cr N abnormalities. Sensory, motor and Cerebellar functions appear Nl w/o focal abnormalities.    Assessment & Plan:   1. Acute bronchitis, unspecified organism  - predniSONE (DELTASONE) 20 MG tablet; 1 tab 3 x day for 3 days, then 1 tab 2 x day for 3 days, then 1 tab 1 x day for 5 days  Dispense: 20 tablet; Refill: 0 - levofloxacin (LEVAQUIN) 500 MG tablet; Take 1 tablet daily with food for infection  Dispense: 15 tablet; Refill: 1  2. COPD  - Recc Mucus Relief 1200 mg bid -m Recc if sx's changed or worsened or o2 dropped to <90 , then go to ER

## 2016-08-28 NOTE — Patient Instructions (Addendum)
Acute Bronchitis Bronchitis is inflammation of the airways that extend from the windpipe into the lungs (bronchi). The inflammation often causes mucus to develop. This leads to a cough, which is the most common symptom of bronchitis.  In acute bronchitis, the condition usually develops suddenly and goes away over time, usually in a couple weeks. Smoking, allergies, and asthma can make bronchitis worse. Repeated episodes of bronchitis may cause further lung problems.  CAUSES Acute bronchitis is most often caused by the same virus that causes a cold. The virus can spread from person to person (contagious) through coughing, sneezing, and touching contaminated objects. SIGNS AND SYMPTOMS   Cough.   Fever.   Coughing up mucus.   Body aches.   Chest congestion.   Chills.   Shortness of breath.   Sore throat.  DIAGNOSIS  Acute bronchitis is usually diagnosed through a physical exam. Your health care provider will also ask you questions about your medical history. Tests, such as chest X-rays, are sometimes done to rule out other conditions.  TREATMENT  Acute bronchitis usually goes away in a couple weeks. Oftentimes, no medical treatment is necessary. Medicines are sometimes given for relief of fever or cough. Antibiotic medicines are usually not needed but may be prescribed in certain situations. In some cases, an inhaler may be recommended to help reduce shortness of breath and control the cough. A cool mist vaporizer may also be used to help thin bronchial secretions and make it easier to clear the chest.  HOME CARE INSTRUCTIONS  Get plenty of rest.   Drink enough fluids to keep your urine clear or pale yellow (unless you have a medical condition that requires fluid restriction). Increasing fluids may help thin your respiratory secretions (sputum) and reduce chest congestion, and it will prevent dehydration.   Take medicines only as directed by your health care provider.  If  you were prescribed an antibiotic medicine, finish it all even if you start to feel better.  Avoid smoking and secondhand smoke. Exposure to cigarette smoke or irritating chemicals will make bronchitis worse. If you are a smoker, consider using nicotine gum or skin patches to help control withdrawal symptoms. Quitting smoking will help your lungs heal faster.   Reduce the chances of another bout of acute bronchitis by washing your hands frequently, avoiding people with cold symptoms, and trying not to touch your hands to your mouth, nose, or eyes.   Keep all follow-up visits as directed by your health care provider.  SEEK MEDICAL CARE IF: Your symptoms do not improve after 1 week of treatment.  SEEK IMMEDIATE MEDICAL CARE IF:  You develop an increased fever or chills.   You have chest pain.   You have severe shortness of breath.  You have bloody sputum.   You develop dehydration.  You faint or repeatedly feel like you are going to pass out.  You develop repeated vomiting.  You develop a severe headache. MAKE SURE YOU:   Understand these instructions.  Will watch your condition.  Will get help right away if you are not doing well or get worse.

## 2016-08-31 ENCOUNTER — Other Ambulatory Visit: Payer: Self-pay | Admitting: Internal Medicine

## 2016-09-22 ENCOUNTER — Encounter: Payer: Self-pay | Admitting: Physician Assistant

## 2016-09-22 ENCOUNTER — Ambulatory Visit (INDEPENDENT_AMBULATORY_CARE_PROVIDER_SITE_OTHER): Payer: Medicare Other | Admitting: Physician Assistant

## 2016-09-22 VITALS — BP 116/64 | HR 65 | Temp 97.9°F | Resp 16 | Ht 64.0 in | Wt 165.0 lb

## 2016-09-22 DIAGNOSIS — R7303 Prediabetes: Secondary | ICD-10-CM

## 2016-09-22 DIAGNOSIS — R269 Unspecified abnormalities of gait and mobility: Secondary | ICD-10-CM

## 2016-09-22 DIAGNOSIS — M1990 Unspecified osteoarthritis, unspecified site: Secondary | ICD-10-CM

## 2016-09-22 DIAGNOSIS — I959 Hypotension, unspecified: Secondary | ICD-10-CM | POA: Diagnosis not present

## 2016-09-22 DIAGNOSIS — Z0001 Encounter for general adult medical examination with abnormal findings: Secondary | ICD-10-CM | POA: Diagnosis not present

## 2016-09-22 DIAGNOSIS — K219 Gastro-esophageal reflux disease without esophagitis: Secondary | ICD-10-CM

## 2016-09-22 DIAGNOSIS — J45909 Unspecified asthma, uncomplicated: Secondary | ICD-10-CM | POA: Diagnosis not present

## 2016-09-22 DIAGNOSIS — R6889 Other general symptoms and signs: Secondary | ICD-10-CM

## 2016-09-22 DIAGNOSIS — E785 Hyperlipidemia, unspecified: Secondary | ICD-10-CM | POA: Diagnosis not present

## 2016-09-22 DIAGNOSIS — Z79899 Other long term (current) drug therapy: Secondary | ICD-10-CM | POA: Diagnosis not present

## 2016-09-22 DIAGNOSIS — R32 Unspecified urinary incontinence: Secondary | ICD-10-CM | POA: Diagnosis not present

## 2016-09-22 DIAGNOSIS — D128 Benign neoplasm of rectum: Secondary | ICD-10-CM

## 2016-09-22 DIAGNOSIS — J449 Chronic obstructive pulmonary disease, unspecified: Secondary | ICD-10-CM | POA: Diagnosis not present

## 2016-09-22 DIAGNOSIS — G609 Hereditary and idiopathic neuropathy, unspecified: Secondary | ICD-10-CM

## 2016-09-22 DIAGNOSIS — J441 Chronic obstructive pulmonary disease with (acute) exacerbation: Secondary | ICD-10-CM

## 2016-09-22 DIAGNOSIS — M810 Age-related osteoporosis without current pathological fracture: Secondary | ICD-10-CM

## 2016-09-22 DIAGNOSIS — J4489 Other specified chronic obstructive pulmonary disease: Secondary | ICD-10-CM

## 2016-09-22 DIAGNOSIS — E559 Vitamin D deficiency, unspecified: Secondary | ICD-10-CM | POA: Diagnosis not present

## 2016-09-22 DIAGNOSIS — Z0000 Encounter for general adult medical examination without abnormal findings: Secondary | ICD-10-CM

## 2016-09-22 DIAGNOSIS — E2749 Other adrenocortical insufficiency: Secondary | ICD-10-CM

## 2016-09-22 DIAGNOSIS — F325 Major depressive disorder, single episode, in full remission: Secondary | ICD-10-CM

## 2016-09-22 MED ORDER — PREDNISONE 20 MG PO TABS: ORAL_TABLET | ORAL | 0 refills | Status: AC

## 2016-09-22 MED ORDER — DOXYCYCLINE HYCLATE 100 MG PO CAPS: ORAL_CAPSULE | ORAL | 0 refills | Status: DC

## 2016-09-22 MED ORDER — UMECLIDINIUM-VILANTEROL 62.5-25 MCG/INH IN AEPB: 1.0000 | INHALATION_SPRAY | Freq: Every day | RESPIRATORY_TRACT | 0 refills | Status: DC

## 2016-09-22 NOTE — Patient Instructions (Addendum)
Can go up to 4 x a day on the nebulizer treatment Continue the advair and add on anoro once daily with it until you run out- wash your mouth afterwards Start on the longer prednisone taper Start the doxy, take probiotic with it  If your oxygen is lower than 90, worsening shortness of breath go to the ER    Chronic Obstructive Pulmonary Disease Exacerbation Chronic obstructive pulmonary disease (COPD) is a common lung condition in which airflow from the lungs is limited. COPD is a general term that can be used to describe many different lung problems that limit airflow, including chronic bronchitis and emphysema. COPD exacerbations are episodes when breathing symptoms become much worse and require extra treatment. Without treatment, COPD exacerbations can be life threatening, and frequent COPD exacerbations can cause further damage to your lungs. What are the causes?  Respiratory infections.  Exposure to smoke.  Exposure to air pollution, chemical fumes, or dust. Sometimes there is no apparent cause or trigger. What increases the risk?  Smoking cigarettes.  Older age.  Frequent prior COPD exacerbations. What are the signs or symptoms?  Increased coughing.  Increased thick spit (sputum) production.  Increased wheezing.  Increased shortness of breath.  Rapid breathing.  Chest tightness. How is this diagnosed? Your medical history, a physical exam, and tests will help your health care provider make a diagnosis. Tests may include:  A chest X-ray.  Basic lab tests.  Sputum testing.  An arterial blood gas test. How is this treated? Depending on the severity of your COPD exacerbation, you may need to be admitted to a hospital for treatment. Some of the treatments commonly used to treat COPD exacerbations are:  Antibiotic medicines.  Bronchodilators. These are drugs that expand the air passages. They may be given with an inhaler or nebulizer. Spacer devices may be needed  to help improve drug delivery.  Corticosteroid medicines.  Supplemental oxygen therapy.  Airway clearing techniques, such as noninvasive ventilation (NIV) and positive expiratory pressure (PEP). These provide respiratory support through a mask or other noninvasive device. Follow these instructions at home:  Do not smoke. Quitting smoking is very important to prevent COPD from getting worse and exacerbations from happening as often.  Avoid exposure to all substances that irritate the airway, especially to tobacco smoke.  If you were prescribed an antibiotic medicine, finish it all even if you start to feel better.  Take all medicines as directed by your health care provider.It is important to use correct technique with inhaled medicines.  Drink enough fluids to keep your urine clear or pale yellow (unless you have a medical condition that requires fluid restriction).  Use a cool mist vaporizer. This makes it easier to clear your chest when you cough.  If you have a home nebulizer and oxygen, continue to use them as directed.  Maintain all necessary vaccinations to prevent infections.  Exercise regularly.  Eat a healthy diet.  Keep all follow-up appointments as directed by your health care provider. Get help right away if:  You have worsening shortness of breath.  You have trouble talking.  You have severe chest pain.  You have blood in your sputum.  You have a fever.  You have weakness, vomit repeatedly, or faint.  You feel confused.  You continue to get worse. This information is not intended to replace advice given to you by your health care provider. Make sure you discuss any questions you have with your health care provider. Document Released: 08/02/2007 Document  Revised: 03/12/2016 Document Reviewed: 06/09/2013 Elsevier Interactive Patient Education  2017 [employer].

## 2016-09-22 NOTE — Progress Notes (Signed)
MEDICARE ANNUAL WELLNESS VISIT AND Acute sick visit  Assessment:    Hypotension, unspecified hypotension type Controled at this time  COPD Continue inhalers, will add on anoro for short period -     umeclidinium-vilanterol (ANORO ELLIPTA) 62.5-25 MCG/INH AEPB; Inhale 1 puff into the lungs daily. Make sure you rinse your mouth after each use.  Iatrogenic adrenal insufficiency (HCC) Longer taper of steroid  Hyperlipidemia, unspecified hyperlipidemia type -continue medications, check lipids, decrease fatty foods, increase activity.   Prediabetes Decrease carbs  Vitamin D deficiency  Medication management  Unstable Gait Declines PT at this time, continue to walk with walker  Urinary incontinence, unspecified type  Osteoarthritis, unspecified osteoarthritis type, unspecified site  Hereditary and idiopathic peripheral neuropathy Offered PT, she declines  Uncomplicated asthma, unspecified asthma severity, unspecified whether persistent Continue pulmonary follow up  Tubulovillous adenoma of rectum Up to date  Gastroesophageal reflux disease, esophagitis presence not specified Continue PPI/H2 blocker, diet discussed  Osteoporosis, unspecified osteoporosis type, unspecified pathological fracture presence Continue to get DEXA PRN  Encounter for Medicare annual wellness exam  Depression, major, in remission ([place]) Continue medications  COPD exacerbation ([place]) -     doxycycline (VIBRAMYCIN) 100 MG capsule; Take 1 capsule twice daily with food -     predniSONE (DELTASONE) 20 MG tablet; 1 pill 3 x a day for 5 days, 1 pill 2 x a day x 5 days, 1 pill a day x 10 days with food then go to 10mg  daily -     umeclidinium-vilanterol (ANORO ELLIPTA) 62.5-25 MCG/INH AEPB; Inhale 1 puff into the lungs daily. Make sure you rinse your mouth after each use.    Over 40 minutes of exam, counseling, chart review and critical decision making was performed Future Appointments Date Time  Provider [place]  09/29/2016 11:30 AM Vicie Mutters, PA-C [facility] None  11/16/2016 3:30 PM Starlyn Skeans, PA-C [facility] None  02/17/2017 11:00 AM [name], MD [facility] None     Plan:   During the course of the visit the patient was educated and counseled about appropriate screening and preventive services including:    Pneumococcal vaccine   Prevnar 13  Influenza vaccine  Td vaccine  Screening electrocardiogram  Bone densitometry screening  Colorectal cancer screening  Diabetes screening  Glaucoma screening  Nutrition counseling   Advanced directives: requested   Subjective:  Marie [name] is a 80 y.o. female who presents for Medicare Annual Wellness Visit and sick visit    Her blood pressure has been controlled at home, today their BP is BP: 116/64  She does not workout. She denies chest pain, shortness of breath, dizziness. She has history of COPD, has long term use steroid. She was treated for AECOPD on 11/10 with levaquin and prednisone burst, states she was feeling better than Wednesday of last week she started to have wheezing, O2 at home was 90's at home, no chest congestion, no CP, SOB, no fever, chills. Just wheezing with nonproductive cough. She is on 10mg  daily Wt Readings from Last 3 Encounters:  09/22/16 165 lb (74.8 kg)  08/28/16 169 lb 3.2 oz (76.7 kg)  08/13/16 169 lb (76.7 kg)   She has unstable gait and is high fall risk, has had neuro consult in the past, uses walker.  She is on prozac for depression which helps.   She has stage 3 renal disease due to HTN/age, being monitored, has been stable.  Lab Results  Component Value Date   GFRNONAA 61 (L) 08/13/2016  She is on cholesterol medication and denies myalgias. Her cholesterol is not at goal. The cholesterol last visit was:   Lab Results  Component Value Date   CHOL 150 08/13/2016   HDL 50 08/13/2016   LDLCALC 60 08/13/2016   TRIG 201 (H) 08/13/2016   CHOLHDL  3.0 08/13/2016    She has been working on diet and exercise for prediabetes, and denies paresthesia of the feet, polydipsia, polyuria and visual disturbances. Last A1C in the office was:  Lab Results  Component Value Date   HGBA1C 5.4 08/13/2016   Patient is on Vitamin D supplement.   Lab Results  Component Value Date   VD25OH 66 04/28/2016      Medication Review: Current Outpatient Prescriptions on File Prior to Visit  Medication Sig Dispense Refill  . ADVAIR DISKUS 250-50 MCG/DOSE AEPB INHALE ONE DOSE BY MOUTH TWICE DAILY 60 each 1  . albuterol (PROVENTIL) (2.5 MG/3ML) 0.083% nebulizer solution Take 3 mLs (2.5 mg total) by nebulization every 6 (six) hours as needed for wheezing or shortness of breath. 75 mL 4  . aspirin 81 MG tablet Take 81 mg by mouth daily. Buys OTC    . cholecalciferol (VITAMIN D) 1000 UNITS tablet Take 5 tablets (5,000 Units total) by mouth daily.    Marland Kitchen FLUoxetine (PROZAC) 40 MG capsule TAKE 1 CAPSULE EVERY DAY 90 capsule 3  . furosemide (LASIX) 40 MG tablet TAKE 1 TABLET EVERY DAY 90 tablet 3  . levofloxacin (LEVAQUIN) 500 MG tablet Take 1 tablet daily with food for infection 15 tablet 1  . Multiple Vitamin (MULTIVITAMIN) capsule Take 1 capsule by mouth daily.     Marland Kitchen nystatin (MYCOSTATIN) 100000 UNIT/ML suspension SWISH AND SPIT  5 ML BY MOUTH 4 TIMES DAILY FOR 2 DAYS AFTER SYMPTOMS RESOLVE 240 mL 3  . omeprazole (PRILOSEC) 40 MG capsule TAKE 1 CAPSULE TWICE DAILY  FOR  ACID  REFLUX 180 capsule 11  . oxybutynin (DITROPAN) 5 MG tablet TAKE ONE TABLET BY MOUTH ONCE DAILY 30 tablet 2  . potassium chloride SA (K-DUR,KLOR-CON) 20 MEQ tablet Take 1 tablet (20 mEq total) by mouth 2 (two) times daily. 60 tablet 1  . pravastatin (PRAVACHOL) 40 MG tablet TAKE 1 TABLET EVERY DAY  FOR  CHOLESTEROL (Patient taking differently: TAKE 1/2 TABLET EVERY DAY  FOR  CHOLESTEROL) 90 tablet 11  . traZODone (DESYREL) 50 MG tablet 1/2-1 tablet for sleep 30 tablet 2   No current  facility-administered medications on file prior to visit.     Allergies  Allergen Reactions  . Ertapenem Hives    Caused whole body to turn red Other reaction(s): Redness Caused whole body to turn red  . Codeine Other (See Comments)    hallucinations  . Demerol Other (See Comments)    hallucinations  . Morphine And Related Other (See Comments)    hallucinations  . Other Other (See Comments)    Invan 7- pt became red all over  . Sulfate Other (See Comments)    unknown    Current Problems (verified) Patient Active Problem List   Diagnosis Date Noted  . Depression, major, in remission ([place]) 09/22/2016  . At high risk for falls 10/16/2015  . Hypotension   . Asthma 11/12/2014  . Prediabetes 08/23/2014  . Vitamin D deficiency 08/23/2014  . Medication management 08/23/2014  . COPD 01/30/2014  . Unstable Gait 01/30/2014  . Incontinence   . Osteoarthritis   . Hyperlipidemia   . GERD (gastroesophageal reflux disease)   .  Hereditary and idiopathic peripheral neuropathy   . Iatrogenic adrenal insufficiency ([place])   . Tubulovillous adenoma of rectum 09/04/2011    Screening Tests Immunization History  Administered Date(s) Administered  . Influenza, High Dose Seasonal PF 08/23/2014, 07/24/2015, 08/13/2016  . Pneumococcal Conjugate-13 08/23/2014  . Pneumococcal-Unspecified 07/20/2003, 08/17/2013  . Td 08/17/2013  . Tdap 10/25/2012  . Zoster 03/19/2015    Preventative care: Last colonoscopy: 2012 Last mammogram: 05/13/2016 Last pap smear/pelvic exam: remote   DEXA: 02/07/2015  Prior vaccinations: TD or Tdap: 2014  Influenza: 2017 Pneumococcal: 2014 Prevnar13: 2015 Shingles/Zostavax: 2016  Names of Other Physician/Practitioners you currently use: 1. [facility] here for primary care 2. Eulas Post, eye doctor, last visit 2016 3. Harrington Challenger, dentist, last visit Nov 2017 Patient Care Team: [name], MD as PCP - General (Internal  Medicine) Dorna Leitz, MD as Consulting Physician (Orthopedic Surgery) Teena Irani, MD as Consulting Physician (Gastroenterology) [place], DPM as Consulting Physician (Podiatry)  SURGICAL HISTORY She  has a past surgical history that includes Incontinence surgery; Rectal surgery; Appendectomy; Abdominal hysterectomy; Tonsillectomy; Eye surgery; Dilation and curettage of uterus; Rectal biopsy (09/21/2011); Finger surgery; External fixation leg (10/25/2012); Knee arthroscopy with medial menisectomy (Right, 09/19/2014); Knee arthroscopy with lateral menisectomy (Right, 09/19/2014); and Chondroplasty (09/19/2014). FAMILY HISTORY Her family history includes COPD in her mother; Cancer in her sister; Heart attack in her father; High blood pressure in her mother. SOCIAL HISTORY She  reports that she has never smoked. She has never used smokeless tobacco. She reports that she does not drink alcohol or use drugs.   MEDICARE WELLNESS OBJECTIVES: Physical activity: Current Exercise Habits: The patient does not participate in regular exercise at present, Exercise limited by: respiratory conditions(s) Cardiac risk factors: Cardiac Risk Factors include: advanced age (>86men, >70 women);dyslipidemia;hypertension;sedentary lifestyle;obesity (BMI >30kg/m2);family history of premature cardiovascular disease Depression/mood screen:   Depression screen [facility] 2/9 09/22/2016  Decreased Interest 0  Down, Depressed, Hopeless 0  PHQ - 2 Score 0    ADLs:  In your present state of health, do you have any difficulty performing the following activities: 09/22/2016 04/28/2016  Hearing? Y N  Vision? N N  Difficulty concentrating or making decisions? N N  Walking or climbing stairs? Y N  Dressing or bathing? Y N  Doing errands, shopping? Y N  Preparing Food and eating ? Y -  Using the Toilet? Y -  In the past six months, have you accidently leaked urine? Y -  Do you have problems with loss of bowel control? N -  Managing  your Medications? Y -  Managing your Finances? N -  Housekeeping or managing your Housekeeping? Y -  Some recent data might be hidden     Cognitive Testing  Alert? Yes  Normal Appearance?Yes  Oriented to person? Yes  Place? Yes   Time? Yes  Recall of three objects?  Yes  Can perform simple calculations? Yes  Displays appropriate judgment?Yes  Can read the correct time from a watch face?Yes  EOL planning: Does Patient Have a Medical Advance Directive?: Yes  Review of Systems  Constitutional: Positive for malaise/fatigue. Negative for chills, diaphoresis, fever and weight loss.  Eyes: Negative.   Respiratory: Positive for cough, shortness of breath and wheezing. Negative for hemoptysis and sputum production.   Cardiovascular: Negative.   Gastrointestinal: Positive for heartburn. Negative for abdominal pain, blood in stool, constipation, diarrhea, melena, nausea and vomiting.  Genitourinary: Negative.  Negative for dysuria.  Musculoskeletal: Positive for back  pain, joint pain, myalgias and neck pain. Negative for falls (high risk falls).  Skin: Negative.   Neurological: Positive for tingling. Negative for dizziness, tremors, sensory change, speech change, focal weakness, seizures, loss of consciousness and weakness.  Psychiatric/Behavioral: Positive for memory loss. Negative for depression, hallucinations, substance abuse and suicidal ideas. The patient is nervous/anxious. The patient does not have insomnia.      Objective:     Today's Vitals   09/22/16 1123  BP: 116/64  Pulse: 65  Resp: 16  Temp: 97.9 F (36.6 C)  SpO2: 95%  Weight: 165 lb (74.8 kg)  Height: 5\' 4"  (1.626 m)  PainSc: 0-No pain   Body mass index is 28.32 kg/m.  General Appearance: Well nourished, in no apparent distress. Eyes: PERRLA, EOMs, conjunctiva no swelling or erythema Sinuses: No Frontal/maxillary tenderness ENT/Mouth: Ext aud canals clear, TMs without erythema, bulging. No erythema, swelling,  or exudate on post pharynx.  Tonsils not swollen or erythematous. Hearing decreased Neck: Supple, thyroid normal.  Respiratory: Respiratory effort normal, = diffuse wheezing and decreased breath sounds without rales, rhonchi, or stridor. 93% RA Cardio: RRR with holosystolic murmur.  Without edema.  Abdomen: Soft, + BS, obese, distented, Non tender, no guarding, rebound, hernias, masses. Lymphatics: Non tender without lymphadenopathy.  Musculoskeletal: Full ROM, 4/5 strength, Ambulates with walker  Skin: Warm, dry without rashes, lesions, ecchymosis.  Neuro: Cranial nerves intact. Normal muscle tone, no cerebellar symptoms. Psych: Awake and oriented X 3, normal affect, Insight and Judgment appropriate.   Medicare Attestation I have personally reviewed: The patient's medical and social history Their use of alcohol, tobacco or illicit drugs Their current medications and supplements The patient's functional ability including ADLs,fall risks, home safety risks, cognitive, and hearing and visual impairment Diet and physical activities Evidence for depression or mood disorders  The patient's weight, height, BMI, and visual acuity have been recorded in the chart.  I have made referrals, counseling, and provided education to the patient based on review of the above and I have provided the patient with a written personalized care plan for preventive services.     Vicie Mutters, PA-C   09/22/2016

## 2016-09-29 ENCOUNTER — Encounter: Payer: Self-pay | Admitting: Physician Assistant

## 2016-09-29 ENCOUNTER — Ambulatory Visit (INDEPENDENT_AMBULATORY_CARE_PROVIDER_SITE_OTHER): Payer: Medicare Other | Admitting: Physician Assistant

## 2016-09-29 VITALS — BP 124/80 | HR 80 | Temp 97.3°F | Resp 16 | Ht 64.0 in | Wt 161.0 lb

## 2016-09-29 DIAGNOSIS — J449 Chronic obstructive pulmonary disease, unspecified: Secondary | ICD-10-CM | POA: Diagnosis not present

## 2016-09-29 NOTE — Progress Notes (Signed)
Assessment and Plan: COPD exacerbation- improved, continue inhalers, continue prednisone. Has follow up 01/29.  Future Appointments Date Time Provider [place]  11/16/2016 3:30 PM Starlyn Skeans, PA-C [facility] None  02/17/2017 11:00 AM [name], MD [facility] None     HPI 80 y.o.female presents for 1 week follow up for COPD exacerbation. Anoro was added to her inhalers, she was put on prednisone with long taper, and doxycycline. Her and her husband are here today and say that she is feeling better, still with cough. Weight is down.  Wt Readings from Last 3 Encounters:  09/29/16 161 lb (73 kg)  09/22/16 165 lb (74.8 kg)  08/28/16 169 lb 3.2 oz (76.7 kg)    Past Medical History:  Diagnosis Date  . Balance disorder 2008.     Falls a lot.  She can be standing and then leans too far over to one side   . Bulging disc   . Chronic bronchitis ([place])    due to congestion at times, on prednisone and advair  . COPD (chronic obstructive pulmonary disease) ([place])   . Depression    many years ago  . GERD (gastroesophageal reflux disease)   . Hearing loss    mild  . Hyperlipidemia   . Hypertension    followed by intern med Dr. Marisue [place] [phone]  . Iatrogenic adrenal insufficiency ([place])   . Incontinence    not indicated at this visit.  Marland Kitchen Neuropathy (HCC)    legs stay numb   . Osteoarthritis    hands,   . Osteoporosis   . Shortness of breath dyspnea   . Tubulovillous adenoma of rectum   . Wears dentures   . Wears glasses      Allergies  Allergen Reactions  . Ertapenem Hives    Caused whole body to turn red Other reaction(s): Redness Caused whole body to turn red  . Codeine Other (See Comments)    hallucinations  . Demerol Other (See Comments)    hallucinations  . Morphine And Related Other (See Comments)    hallucinations  . Other Other (See Comments)    Invan 7- pt became red all over  . Sulfate Other (See Comments)    unknown    Current Outpatient  Prescriptions on File Prior to Visit  Medication Sig  . ADVAIR DISKUS 250-50 MCG/DOSE AEPB INHALE ONE DOSE BY MOUTH TWICE DAILY  . albuterol (PROVENTIL) (2.5 MG/3ML) 0.083% nebulizer solution Take 3 mLs (2.5 mg total) by nebulization every 6 (six) hours as needed for wheezing or shortness of breath.  Marland Kitchen aspirin 81 MG tablet Take 81 mg by mouth daily. Buys OTC  . cholecalciferol (VITAMIN D) 1000 UNITS tablet Take 5 tablets (5,000 Units total) by mouth daily.  Marland Kitchen doxycycline (VIBRAMYCIN) 100 MG capsule Take 1 capsule twice daily with food  . FLUoxetine (PROZAC) 40 MG capsule TAKE 1 CAPSULE EVERY DAY  . furosemide (LASIX) 40 MG tablet TAKE 1 TABLET EVERY DAY  . levofloxacin (LEVAQUIN) 500 MG tablet Take 1 tablet daily with food for infection  . Multiple Vitamin (MULTIVITAMIN) capsule Take 1 capsule by mouth daily.   Marland Kitchen nystatin (MYCOSTATIN) 100000 UNIT/ML suspension SWISH AND SPIT  5 ML BY MOUTH 4 TIMES DAILY FOR 2 DAYS AFTER SYMPTOMS RESOLVE  . omeprazole (PRILOSEC) 40 MG capsule TAKE 1 CAPSULE TWICE DAILY  FOR  ACID  REFLUX  . oxybutynin (DITROPAN) 5 MG tablet TAKE ONE TABLET BY MOUTH ONCE DAILY  . potassium chloride SA (K-DUR,KLOR-CON) 20 MEQ tablet  Take 1 tablet (20 mEq total) by mouth 2 (two) times daily.  . pravastatin (PRAVACHOL) 40 MG tablet TAKE 1 TABLET EVERY DAY  FOR  CHOLESTEROL (Patient taking differently: TAKE 1/2 TABLET EVERY DAY  FOR  CHOLESTEROL)  . predniSONE (DELTASONE) 20 MG tablet 1 pill 3 x a day for 5 days, 1 pill 2 x a day x 5 days, 1 pill a day x 10 days with food then go to 10mg  daily  . traZODone (DESYREL) 50 MG tablet 1/2-1 tablet for sleep  . umeclidinium-vilanterol (ANORO ELLIPTA) 62.5-25 MCG/INH AEPB Inhale 1 puff into the lungs daily. Make sure you rinse your mouth after each use.   No current facility-administered medications on file prior to visit.     ROS: all negative except above.   Physical Exam: Filed Weights   09/29/16 1129  Weight: 161 lb (73 kg)   BP  124/80   Pulse 80   Temp 97.3 F (36.3 C)   Resp 16   Ht 5\' 4"  (1.626 m)   Wt 161 lb (73 kg)   SpO2 95%   BMI 27.64 kg/m  General Appearance: Well nourished, in no apparent distress. Eyes: PERRLA, EOMs, conjunctiva no swelling or erythema Sinuses: No Frontal/maxillary tenderness ENT/Mouth: Ext aud canals clear, TMs without erythema, bulging. No erythema, swelling, or exudate on post pharynx.  Tonsils not swollen or erythematous. Hearing decreased Neck: Supple, thyroid normal.  Respiratory: Respiratory effort normal, decreased breath sounds without wheezing, rales, rhonchi, or stridor. 95% RA Cardio: RRR with holosystolic murmur.  Without edema.  Abdomen: Soft, + BS, obese, nondistented, Non tender, no guarding, rebound, hernias, masses. Lymphatics: Non tender without lymphadenopathy.  Musculoskeletal: Full ROM, 4/5 strength, Ambulates with walker  Skin: Warm, dry without rashes, lesions, ecchymosis.  Neuro: Cranial nerves intact. Normal muscle tone, no cerebellar symptoms. Psych: Awake and oriented X 3, normal affect, Insight and Judgment appropriate.   Vicie Mutters, PA-C 11:35 AM [facility]

## 2016-09-29 NOTE — Patient Instructions (Signed)
HOME CARE INSTRUCTIONS   Do not stand or sit in one position for long periods of time. Do not sit with your legs crossed. Rest with your legs raised during the day.  Your legs have to be higher than your heart so that gravity will force the valves to open, so please really elevate your legs.   Wear elastic stockings or support hose. Do not wear other tight, encircling garments around the legs, pelvis, or waist.  [employer]  has a wide variety of well priced compression stockings. [place], [place] [address] [phone]  Walk as much as possible to increase blood flow.  Raise the foot of your bed at night with 2-inch blocks. SEEK MEDICAL CARE IF:   The skin around your ankle starts to break down.  You have pain, redness, tenderness, or hard swelling developing in your leg over a vein.  You are uncomfortable due to leg pain. Document Released: 07/15/2005 Document Revised: 12/28/2011 Document Reviewed: 12/01/2010 [facility] Patient Information 2014 [place].  Bursitis Introduction Bursitis is inflammation and irritation of a bursa, which is one of the small, fluid-filled sacs that cushion and protect the moving parts of your body. These sacs are located between bones and muscles, muscle attachments, or skin areas next to bones. A bursa protects these structures from the wear and tear that results from frequent movement. An inflamed bursa causes pain and swelling. Fluid may build up inside the sac. Bursitis is most common near joints, especially the knees, elbows, hips, and shoulders. What are the causes? Bursitis can be caused by:  Injury from:  A direct blow, like falling on your knee or elbow.  Overuse of a joint (repetitive stress).  Infection. This can happen if bacteria gets into a bursa through a cut or scrape near a joint.  Diseases that cause joint inflammation, such as gout and rheumatoid arthritis. What increases the risk? You may be at risk  for bursitis if you:  Have a job or hobby that involves a lot of repetitive stress on your joints.  Have a condition that weakens your body's defense system (immune system), such as diabetes, cancer, or HIV.  Lift and reach overhead often.  Kneel or lean on hard surfaces often.  Run or walk often. What are the signs or symptoms? The most common signs and symptoms of bursitis are:  Pain that gets worse when you move the affected body part or put weight on it.  Inflammation.  Stiffness. Other signs and symptoms may include:  Redness.  Tenderness.  Warmth.  Pain that continues after rest.  Fever and chills. This may occur in bursitis caused by infection. How is this diagnosed? Bursitis may be diagnosed by:  Medical history and physical exam.  MRI.  A procedure to drain fluid from the bursa with a needle (aspiration). The fluid may be checked for signs of infection or gout.  Blood tests to rule out other causes of inflammation. How is this treated? Bursitis can usually be treated at home with rest, ice, compression, and elevation (RICE). For mild bursitis, RICE treatment may be all you need. Other treatments may include:  Nonsteroidal anti-inflammatory drugs (NSAIDs) to treat pain and inflammation.  Corticosteroids to fight inflammation. You may have these drugs injected into and around the area of bursitis.  Aspiration of bursitis fluid to relieve pain and improve movement.  Antibiotic medicine to treat an infected bursa.  A splint, brace, or walking aid.  Physical therapy if  you continue to have pain or limited movement.  Surgery to remove a damaged or infected bursa. This may be needed if you have a very bad case of bursitis or if other treatments have not worked. Follow these instructions at home:  Take medicines only as directed by your health care provider.  If you were prescribed an antibiotic medicine, finish it all even if you start to feel  better.  Rest the affected area as directed by your health care provider.  Keep the area elevated.  Avoid activities that make pain worse.  Apply ice to the injured area:  Place ice in a plastic bag.  Place a towel between your skin and the bag.  Leave the ice on for 20 minutes, 2-3 times a day.  Use splints, braces, pads, or walking aids as directed by your health care provider.  Keep all follow-up visits as directed by your health care provider. This is important. How is this prevented?  Wear knee pads if you kneel often.  Wear sturdy running or walking shoes that fit you well.  Take regular breaks from repetitive activity.  Warm up by stretching before doing any strenuous activity.  Maintain a healthy weight or lose weight as recommended by your health care provider. Ask your health care provider if you need help.  Exercise regularly. Start any new physical activity gradually. Contact a health care provider if:  Your bursitis is not responding to treatment or home care.  You have a fever.  You have chills. This information is not intended to replace advice given to you by your health care provider. Make sure you discuss any questions you have with your health care provider. Document Released: 10/02/2000 Document Revised: 03/12/2016 Document Reviewed: 12/25/2013  2017 [employer]

## 2016-10-01 ENCOUNTER — Telehealth: Payer: Self-pay | Admitting: *Deleted

## 2016-10-01 NOTE — Telephone Encounter (Signed)
Patient called and states the Nystatin suspension is not working as well as the [employer] and requested an RX be sent in.  RX sent to [facility], [place] per Vicie Mutters, [place].

## 2016-10-07 ENCOUNTER — Encounter: Payer: Self-pay | Admitting: Internal Medicine

## 2016-10-08 ENCOUNTER — Other Ambulatory Visit: Payer: Self-pay | Admitting: Internal Medicine

## 2016-10-13 ENCOUNTER — Other Ambulatory Visit: Payer: Self-pay | Admitting: Physician Assistant

## 2016-10-20 ENCOUNTER — Ambulatory Visit (INDEPENDENT_AMBULATORY_CARE_PROVIDER_SITE_OTHER): Payer: Medicare Other | Admitting: Internal Medicine

## 2016-10-20 ENCOUNTER — Encounter: Payer: Self-pay | Admitting: Internal Medicine

## 2016-10-20 VITALS — BP 118/64 | HR 80 | Temp 97.8°F | Resp 16 | Ht 64.0 in | Wt 158.8 lb

## 2016-10-20 DIAGNOSIS — J45901 Unspecified asthma with (acute) exacerbation: Secondary | ICD-10-CM | POA: Diagnosis not present

## 2016-10-20 MED ORDER — PREDNISONE 20 MG PO TABS: ORAL_TABLET | ORAL | 0 refills | Status: DC

## 2016-10-20 MED ORDER — AZITHROMYCIN 250 MG PO TABS: ORAL_TABLET | ORAL | 1 refills | Status: DC

## 2016-10-20 NOTE — Progress Notes (Signed)
[facility]   [name], M.D.    [name]. Silverio Lay, P.A.-C      Starlyn Skeans, P.A.-C  [facility]                [address] [place] [id number] Telephone [phone] Telefax [phone]  Subjective:    Patient ID: Marie [name], female    DOB: October 01, 1936, 81 y.o.   MRN: [id number]  HPI  This very nice 81 yo MWF with moderately severe COPd was treated in Nov wit 2 week course of Levaquin and then in Dec with Doxycycline for an AECB and each time improved and had done well since last OV 12/12 until the last 4-5 days has had increasing congestion and wheezing concomittent wit a taper of her prednisone and apparently sh's only been using her Albuterol Neb's 2 x/day. Denies fever , chills , sweats , but does endorse dyspnea with activity and reports O2 sat's range from 90's, but usually about 94-96%.   Medication Sig  . ADVAIR DISKUS 250-50 MCG/DOSE AEPB INHALE ONE PUFF BY MOUTH TWICE DAILY  . albuterol  (2.5 MG/3ML) neb soln Take 3 mLs (2.5 mg) by neb every 6  hrs   . aspirin 81 MG tablet Take 81 mg by mouth daily. Buys OTC  . VITAMIN D 1000 UNITS tablet Take 5 tablets (5,000 Units total) by mouth daily.  Marland Kitchen FLUoxetine  40 MG capsule TAKE 1 CAPSULE EVERY DAY  . furosemide  40 MG tablet TAKE 1 TABLET EVERY DAY  . Multiple Vitamin Take 1 capsule by mouth daily.   Marland Kitchen omeprazole  40 MG capsule TAKE 1 CAPSULE TWICE DAILY  FOR  ACID  REFLUX  . oxybutynin  5 MG tablet TAKE ONE TABLET BY MOUTH ONCE DAILY  . K-DUR 20 MEQ tablet Take 1 tablet (20 mEq total) by mouth 2 (two) times daily.  . pravastatin  40 MG tablet TAKE 1 TABLET EVERY DAY FOR CHOLESTEROL  . traZODone  50 MG tablet 1/2-1 tablet for sleep   Allergies  Allergen Reactions  . Ertapenem Hives    Caused whole body to turn red Other reaction(s): Redness Caused whole body to turn red  . Codeine Other (See Comments)    hallucinations   . Demerol Other (See Comments)    hallucinations  . Morphine And Related Other (See Comments)    hallucinations  . Other Other (See Comments)    Invan 7- pt became red all over  . Sulfate Other (See Comments)    unknown    Past Medical History:  Diagnosis Date  . Balance disorder 2008.     Falls a lot.  She can be standing and then leans too far over to one side   . Bulging disc   . Chronic bronchitis ([place])    due to congestion at times, on prednisone and advair  . COPD (chronic obstructive pulmonary disease) ([place])   . Depression    many years ago  . GERD (gastroesophageal reflux disease)   . Hearing loss    mild  . Hyperlipidemia   . Hypertension    followed by intern med Dr. Marisue Brooklyn [phone]  . Iatrogenic adrenal insufficiency ([place])   . Incontinence    not indicated at this visit.  Marland Kitchen Neuropathy (HCC)    legs stay numb   . Osteoarthritis  hands,   . Osteoporosis   . Shortness of breath dyspnea   . Tubulovillous adenoma of rectum   . Wears dentures   . Wears glasses    Review of Systems  10 point systems review negative except as above.    Objective:   Physical Exam  BP 118/64   Pulse 80   Temp 97.8 F (36.6 C)   Resp 16   Ht 5\' 4"  (1.626 m)   Wt 158 lb 12.8 oz (72 kg)   BMI 27.26 kg/m   Congested wheezy cough. No Stridor. O2 sat's 92-94%  HEENT - Eac's patent. TM's Nl. EOM's full. PERRLA. NasoOroPharynx clear. Neck - supple. Nl Thyroid. Carotids 2+ & No bruits, nodes, JVD Chest - BS decreased with scattered rale, rhonchi and expiratory wheezes.  Cor - Nl HS. RRR w/o sig MGR. PP 1(+). No edema. MS- FROM w/o deformities. Muscle power, tone and bulk Nl. Gait unstable, broad-based and stabilized with a walker. Neuro - Nl w/o focal abnormalities.    Assessment & Plan:   1. AECB (asthmatic bronchitis), unspecified asthma severity, with acute exacerbation  - predniSONE  20 MG tab; 1 tab 3 x day for 3 days, then 1 tab 2 x day for 3 days, then 1 tab 1 x  day for 5 days  Disp: 20 tab; then resume Prednisone 10 mg daily  - azithromycin  250 MG tab; Take 2 tab on  Day 1,  followed by 1 tab once daily on Days 2 - 5.  Disp: 6 ; Refill: x 1  - discussed meds & SE"s   - Recc increase Neb Tx's to 4x/day - meals and hs  - advised monitoring O2 sat's and if worsen <90 to go to ER to be evaluated

## 2016-10-23 ENCOUNTER — Telehealth: Payer: Self-pay | Admitting: *Deleted

## 2016-10-23 NOTE — Telephone Encounter (Signed)
Patient called and asked if she has a DX of COPD.  Per Dr Melford Aase, the patient has had this DX for several years and is the reason she has respiratory problems.  The patient was advised.

## 2016-11-02 ENCOUNTER — Encounter: Payer: Self-pay | Admitting: Internal Medicine

## 2016-11-09 ENCOUNTER — Encounter: Payer: Self-pay | Admitting: Internal Medicine

## 2016-11-09 ENCOUNTER — Ambulatory Visit (INDEPENDENT_AMBULATORY_CARE_PROVIDER_SITE_OTHER): Payer: Medicare Other | Admitting: Internal Medicine

## 2016-11-09 VITALS — BP 118/66 | HR 73 | Temp 97.9°F | Resp 14 | Wt 157.6 lb

## 2016-11-09 DIAGNOSIS — J441 Chronic obstructive pulmonary disease with (acute) exacerbation: Secondary | ICD-10-CM | POA: Diagnosis not present

## 2016-11-09 MED ORDER — MONTELUKAST SODIUM 10 MG PO TABS: 10.0000 mg | ORAL_TABLET | Freq: Every day | ORAL | 2 refills | Status: DC

## 2016-11-09 MED ORDER — BUDESONIDE 0.5 MG/2ML IN SUSP: 0.5000 mg | Freq: Two times a day (BID) | RESPIRATORY_TRACT | 12 refills | Status: DC

## 2016-11-09 NOTE — Progress Notes (Addendum)
HPI  Patient presents to the office for evaluation of cough.  It has been going on for 1 weeks.  Patient reports night > day, wet, barky, clear mucous production.  They also endorse shortness of breath, sputum production and wheezing.  They have tried none.  They report that nothing has worked.  They denies other sick contacts.  Review of Systems  Constitutional: Positive for malaise/fatigue. Negative for chills and fever.  HENT: Positive for congestion, ear pain, hearing loss and sore throat.   Respiratory: Positive for cough. Negative for sputum production, shortness of breath and wheezing.   Cardiovascular: Negative for chest pain, palpitations and leg swelling.  Neurological: Positive for headaches.    PE:  Vitals:   11/09/16 1528  BP: 118/66  Pulse: 73  Resp: 14  Temp: 97.9 F (36.6 C)    General:  Alert and non-toxic, WDWN, NAD HEENT: NCAT, PERLA, EOM normal, no occular discharge or erythema.  Nasal mucosal edema with sinus tenderness to palpation.  Oropharynx clear with minimal oropharyngeal edema and erythema.  Mucous membranes moist and pink. Neck:  Cervical adenopathy Chest:  RRR no MRGs.  Lungs clear to auscultation A&P with diffuse wheeze which improved with duoneb.  There are course crackles at the bases of the lungs bilaterally. Abdomen: +BS x 4 quadrants, soft, non-tender, no guarding, rigidity, or rebound. Skin: warm and dry no rash Neuro: A&Ox4, CN II-XII grossly intact  Assessment and Plan:   1. COPD exacerbation ([place]) -start doing pulmicort twice daily with the nebulizer machine -albuterol q4-6 hours on a schedule -continue prednisone taper to 10 mg daily -start monteleukast nightly -recheck in a week.  Has been non-compliant with advair which is likely why she is having recurrent wheezing and COPD exacerbations -if still bad wheezing consider Chest CT and possible pulmonary referral -doubt has inspiratory capacity and mental where with all for regular  inhaler

## 2016-11-09 NOTE — Patient Instructions (Addendum)
Please use the albuterol every 4-6 hours in your nebulizer machine.  Please use the pulmicort in the morning and in the evening through your nebulizer machine.  Please do not mix this with the albuterol.  Please stop the advair for the time being.  Please take singulair or monteleukast prior to bedtime.   Please continue the prednisone taper until you get back to the 10 mg tablets daily.

## 2016-11-10 ENCOUNTER — Other Ambulatory Visit: Payer: Self-pay | Admitting: *Deleted

## 2016-11-10 MED ORDER — BUDESONIDE 0.5 MG/2ML IN SUSP: 0.5000 mg | Freq: Two times a day (BID) | RESPIRATORY_TRACT | 12 refills | Status: DC

## 2016-11-12 ENCOUNTER — Other Ambulatory Visit: Payer: Self-pay | Admitting: Internal Medicine

## 2016-11-12 DIAGNOSIS — J440 Chronic obstructive pulmonary disease with acute lower respiratory infection: Secondary | ICD-10-CM

## 2016-11-12 DIAGNOSIS — R0600 Dyspnea, unspecified: Secondary | ICD-10-CM

## 2016-11-16 ENCOUNTER — Ambulatory Visit (HOSPITAL_COMMUNITY)
Admission: RE | Admit: 2016-11-16 | Discharge: 2016-11-16 | Disposition: A | Discharge status: Home / Self Care | Payer: Medicare Other | Source: Ambulatory Visit | Attending: Internal Medicine | Admitting: Internal Medicine

## 2016-11-16 ENCOUNTER — Encounter: Payer: Self-pay | Admitting: Internal Medicine

## 2016-11-16 ENCOUNTER — Ambulatory Visit (INDEPENDENT_AMBULATORY_CARE_PROVIDER_SITE_OTHER): Payer: Medicare Other | Admitting: Internal Medicine

## 2016-11-16 VITALS — BP 120/68 | HR 88 | Temp 98.2°F | Resp 16 | Ht 64.0 in | Wt 155.0 lb

## 2016-11-16 DIAGNOSIS — R05 Cough: Secondary | ICD-10-CM | POA: Diagnosis not present

## 2016-11-16 DIAGNOSIS — J449 Chronic obstructive pulmonary disease, unspecified: Secondary | ICD-10-CM

## 2016-11-16 DIAGNOSIS — J441 Chronic obstructive pulmonary disease with (acute) exacerbation: Secondary | ICD-10-CM | POA: Diagnosis not present

## 2016-11-16 DIAGNOSIS — R0602 Shortness of breath: Secondary | ICD-10-CM

## 2016-11-16 MED ORDER — PREDNISONE 20 MG PO TABS: ORAL_TABLET | ORAL | 1 refills | Status: DC

## 2016-11-16 NOTE — Progress Notes (Signed)
HPI  Patient presents to the office for reevaluation of wheezing and COPD exacerbation.  She reports that she still doesn't feel good.  She reports that she has been doing her albuterol every 6 hours and has done the pulmicort twice a day.  She is finished with the prednisone. She is not taking any prednisone currently.  She cannot tell a difference in mucuous production and can't tell a difference in her breathing.  She reports that she did not feel at any point that she got any better.  Her O2 levels have been in the low 90s at home.  She has not had any below 88.  She is a never smoker.  She reports that she did take a zpak and finished that today. She has chronically been on abx for the last 3 months.     Review of Systems  Constitutional: Positive for malaise/fatigue. Negative for chills and fever.  HENT: Negative for congestion, ear pain, hearing loss and sore throat.   Respiratory: Positive for cough, shortness of breath and wheezing. Negative for sputum production.   Cardiovascular: Negative for chest pain, palpitations and leg swelling.  Gastrointestinal: Negative for abdominal pain, blood in stool, constipation, diarrhea and heartburn.  Genitourinary: Negative.   Skin: Negative.     PE:  Wt Readings from Last 10 Encounters:  11/16/16 155 lb (70.3 kg)  11/09/16 157 lb 9.6 oz (71.5 kg)  10/20/16 158 lb 12.8 oz (72 kg)  09/29/16 161 lb (73 kg)  09/22/16 165 lb (74.8 kg)  08/28/16 169 lb 3.2 oz (76.7 kg)  08/13/16 169 lb (76.7 kg)  07/13/16 177 lb (80.3 kg)  06/23/16 175 lb (79.4 kg)  04/28/16 171 lb 6.4 oz (77.7 kg)     General:  Alert and non-toxic, WDWN, NAD HEENT: NCAT, PERLA, EOM normal, no occular discharge or erythema.  Nasal mucosal edema with sinus tenderness to palpation.  Oropharynx clear with minimal oropharyngeal edema and erythema.  Mucous membranes moist and pink. Neck:  Cervical adenopathy Chest:  RRR no MRGs.  Generalized wheeze heard best at the bases of the  bilateral lungs.  NO appreciable rhonchi.   Abdomen: +BS x 4 quadrants, soft, non-tender, no guarding, rigidity, or rebound. Skin: warm and dry no rash Neuro: A&Ox4, CN II-XII grossly intact  Assessment and Plan:  1. COPD exacerbation ([place]) -still no improvement, is now completely off prednisone -CXR unreavealing and has no real evidence of COPD on imaging.  Possible still could be interstitial lung disease vs. IPF so will try to get CT chest and will refer to pulmonology for further evaluation.   -restart 10 mg of prednisone daily -cont albuterol and pulmicort - DG Chest 2 View; Future - Ambulatory referral to Pulmonology - CT Chest Wo Contrast; Future  2. Chronic obstructive pulmonary disease with bronchospasm (HCC)  - DG Chest 2 View; Future - Ambulatory referral to Pulmonology - CT Chest Wo Contrast; Future  3. Shortness of breath  - CT Chest Wo Contrast; Future

## 2016-11-18 NOTE — Progress Notes (Signed)
Pt aware of lab results & voiced understanding of those results.

## 2016-11-19 ENCOUNTER — Telehealth: Payer: Self-pay | Admitting: Pulmonary Disease

## 2016-11-19 ENCOUNTER — Encounter: Payer: Self-pay | Admitting: Pulmonary Disease

## 2016-11-19 ENCOUNTER — Ambulatory Visit (INDEPENDENT_AMBULATORY_CARE_PROVIDER_SITE_OTHER): Payer: Medicare Other | Admitting: Pulmonary Disease

## 2016-11-19 ENCOUNTER — Ambulatory Visit (HOSPITAL_COMMUNITY)
Admission: RE | Admit: 2016-11-19 | Discharge: 2016-11-19 | Disposition: A | Discharge status: Home / Self Care | Payer: Medicare Other | Source: Ambulatory Visit | Attending: Internal Medicine | Admitting: Internal Medicine

## 2016-11-19 VITALS — BP 120/70 | HR 85 | Ht 64.0 in | Wt 156.8 lb

## 2016-11-19 DIAGNOSIS — J441 Chronic obstructive pulmonary disease with (acute) exacerbation: Secondary | ICD-10-CM | POA: Insufficient documentation

## 2016-11-19 DIAGNOSIS — R0602 Shortness of breath: Secondary | ICD-10-CM | POA: Diagnosis not present

## 2016-11-19 DIAGNOSIS — J9811 Atelectasis: Secondary | ICD-10-CM | POA: Insufficient documentation

## 2016-11-19 DIAGNOSIS — K449 Diaphragmatic hernia without obstruction or gangrene: Secondary | ICD-10-CM | POA: Diagnosis not present

## 2016-11-19 DIAGNOSIS — R062 Wheezing: Secondary | ICD-10-CM

## 2016-11-19 DIAGNOSIS — M5134 Other intervertebral disc degeneration, thoracic region: Secondary | ICD-10-CM | POA: Insufficient documentation

## 2016-11-19 DIAGNOSIS — J449 Chronic obstructive pulmonary disease, unspecified: Secondary | ICD-10-CM | POA: Diagnosis not present

## 2016-11-19 DIAGNOSIS — I700 Atherosclerosis of aorta: Secondary | ICD-10-CM | POA: Diagnosis not present

## 2016-11-19 NOTE — Assessment & Plan Note (Signed)
She is here to see me today primarily for wheezing. She says that she has been told she had COPD recently but she is unaware of the details of this diagnosis. Interestingly, she's never smoked cigarettes. Early on in her career she did work in [employer]. However, otherwise her exposure history doesn't suggest anything that would definitively lead to obstructive lung disease. The differential diagnosis includes idiopathic emphysema versus asthma. Given her pronounced inspiratory phase of wheezing on physical exam I am concerned about extrathoracic tracheal air column obstruction but I cannot visualize that on her chest x-ray nor does she have any palpable masses on neck exam today.  Plan Continue current medicines as directed by primary care physician Pulmonary function testing now CT chest today Follow-up next week to go over these results

## 2016-11-19 NOTE — Telephone Encounter (Signed)
Spoke with pt's husband, [place]. He wanted to know if pt was to continue her nebulizer medications. Advised him that per BQ's instructions, the pt was to continue all of her medications prescribed by her PCP. Budesonide was prescribed by pt's PCP. [place] that albuterol neb solution should only be used prn. He agreed and verbalized understanding. Nothing further was needed at this time.

## 2016-11-19 NOTE — Patient Instructions (Signed)
We will arrange for a lung function test Keep taking her medicines as prescribed by your primary care physician We will have you follow-up with one of our practitioners next week to go over the results of the lung function test and the CAT scan you're having today

## 2016-11-19 NOTE — Progress Notes (Signed)
Subjective:    Patient ID: Marie [name], female    DOB: 02/09/1936, 81 y.o.   MRN: [id number]  HPI Chief Complaint  Patient presents with  . Pulm Consult    for possible COPD. Per patient's husband, xrays on the 29th showed no signs of COPD. Currently has a cough.    Marie [name] is here to see me for wheezing.  She says this has been worse for the last two weeks but it has been occurring off and on for two years.  She says that she hears wheezing sounds from her chest.  She doesn't really feel short of breath.  She says that it comes and goes but she can't think of any triggers that cause it.  She hasn't had a recent  Cold and she never smoked cigarettes.  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  She notes a 20 pounds weight loss which was unintentional.  She says that this has occurred over the last year.  No chest pain.  She is coughing, she will rarely produce yellow mucus.  Its never bloody.    She has been told she has COPD, but she doesn't know much about this.   No family history of lung problems.  It never really correllated with a cough or cold in the last year.  She has acid reflux from time to time.  That has not been worse lately.  She says that the prednisone she has been prescribed helps a little bit. Albuterol also helps. She is currently taking budesonide instead of Advair.   Past Medical History:  Diagnosis Date  . Balance disorder 2008.     Falls a lot.  She can be standing and then leans too far over to one side   . Bulging disc   . Chronic bronchitis ([place])    due to congestion at times, on prednisone and advair  . COPD (chronic obstructive pulmonary disease) ([place])   . Depression    many years ago  . GERD (gastroesophageal reflux disease)   . Hearing loss    mild  . Hyperlipidemia   . Hypertension   . Iatrogenic adrenal insufficiency ([place])   . Incontinence    not indicated at this visit.  Marland Kitchen Neuropathy (HCC)    legs  stay numb   . Osteoarthritis    hands,   . Osteoporosis   . Shortness of breath dyspnea   . Tubulovillous adenoma of rectum   . Wears dentures   . Wears glasses      Family History  Problem Relation Age of Onset  . Cancer Sister     pt unaware of what kind  . High blood pressure Mother   . COPD Mother   . Heart attack Father      Social History   Social History  . Marital status: Married    Spouse name: N/A  . Number of children: N/A  . Years of education: N/A   Occupational History  . Not on file.   Social History Main Topics  . Smoking status: Never Smoker  . Smokeless tobacco: Never Used  . Alcohol use No  . Drug use: No  . Sexual activity: Not on file   Other Topics Concern  . Not on file   Social History Narrative   Lives in a house that is mostly single story.  She uses a cane and a walker which she uses for ambulation.  Drives.  Allergies  Allergen Reactions  . Ertapenem Hives    Caused whole body to turn red Other reaction(s): Redness Caused whole body to turn red  . Codeine Other (See Comments)    hallucinations  . Demerol Other (See Comments)    hallucinations  . Morphine And Related Other (See Comments)    hallucinations  . Other Other (See Comments)    Invan 7- pt became red all over  . Sulfate Other (See Comments)    unknown     Outpatient Medications Prior to Visit  Medication Sig Dispense Refill  . ADVAIR DISKUS 250-50 MCG/DOSE AEPB INHALE ONE PUFF BY MOUTH TWICE DAILY 60 each 1  . albuterol (PROVENTIL) (2.5 MG/3ML) 0.083% nebulizer solution USE ONE VIAL IN NEBULIZER EVERY 6 HOURS AS NEEDED FOR WHEEZING OR SHORTNESS OF BREATH 75 mL 4  . aspirin 81 MG tablet Take 81 mg by mouth daily. Buys OTC    . budesonide (PULMICORT) 0.5 MG/2ML nebulizer solution Take 2 mLs (0.5 mg total) by nebulization 2 (two) times daily. 120 mL 12  . cholecalciferol (VITAMIN D) 1000 UNITS tablet Take 5 tablets (5,000 Units total) by mouth daily.    Marland Kitchen  FLUoxetine (PROZAC) 40 MG capsule TAKE 1 CAPSULE EVERY DAY 90 capsule 3  . furosemide (LASIX) 40 MG tablet TAKE 1 TABLET EVERY DAY 90 tablet 3  . montelukast (SINGULAIR) 10 MG tablet Take 1 tablet (10 mg total) by mouth daily. 30 tablet 2  . Multiple Vitamin (MULTIVITAMIN) capsule Take 1 capsule by mouth daily.     Marland Kitchen nystatin (MYCOSTATIN) 100000 UNIT/ML suspension SWISH AND SPIT  5 ML BY MOUTH 4 TIMES DAILY FOR 2 DAYS AFTER SYMPTOMS RESOLVE 240 mL 3  . omeprazole (PRILOSEC) 40 MG capsule TAKE 1 CAPSULE TWICE DAILY  FOR  ACID  REFLUX 180 capsule 11  . oxybutynin (DITROPAN) 5 MG tablet TAKE ONE TABLET BY MOUTH ONCE DAILY 30 tablet 2  . potassium chloride SA (K-DUR,KLOR-CON) 20 MEQ tablet Take 1 tablet (20 mEq total) by mouth 2 (two) times daily. 60 tablet 1  . pravastatin (PRAVACHOL) 40 MG tablet TAKE 1 TABLET EVERY DAY FOR CHOLESTEROL 90 tablet 11  . predniSONE (DELTASONE) 20 MG tablet Take 1 tablet daily with breakfast 30 tablet 1  . traZODone (DESYREL) 50 MG tablet 1/2-1 tablet for sleep 30 tablet 2   No facility-administered medications prior to visit.       Review of Systems   Gen: Denies fever, chills, + weight change, fatigue, night sweats HEENT: Denies blurred vision, double vision, hearing loss, tinnitus, sinus congestion, rhinorrhea, sore throat, neck stiffness, dysphagia PULM: per HPI CV: Denies chest pain, edema, orthopnea, paroxysmal nocturnal dyspnea, palpitations GI: Denies abdominal pain, nausea, vomiting, diarrhea, hematochezia, melena, constipation, change in bowel habits GU: Denies dysuria, hematuria, polyuria, oliguria, urethral discharge Endocrine: Denies hot or cold intolerance, polyuria, polyphagia or appetite change Derm: Denies rash, dry skin, scaling or peeling skin change Heme: Denies easy bruising, bleeding, bleeding gums Neuro: Denies headache, numbness, weakness, slurred speech, loss of memory or consciousness      Objective:   Physical Exam Vitals:    11/19/16 0912  BP: 120/70  Pulse: 85  SpO2: 94%  Weight: 156 lb 12.8 oz (71.1 kg)  Height: 5\' 4"  (1.626 m)   RA  Gen: well appearing, no acute distress HENT: NCAT, OP clear, neck supple without masses Eyes: PERRL, EOMi Lymph: no cervical lymphadenopathy PULM: Wheezing, upper lung zone primarily biphasic, loudest with inspiratory phase, good air  movement, no distress CV: RRR, no mgr, no JVD GI: BS+, soft, nontender, no hsm Derm: no rash or skin breakdown MSK: normal bulk and tone Neuro: A&Ox4, CN II-XII intact, strength 5/5 in all 4 extremities Psyche: normal mood and affect  11/16/2016 chest x-ray images personally reviewed showing normal pulmonary parenchyma normal tracheal air column.        Assessment & Plan:  Wheezing She is here to see me today primarily for wheezing. She says that she has been told she had COPD recently but she is unaware of the details of this diagnosis. Interestingly, she's never smoked cigarettes. Early on in her career she did work in [employer]. However, otherwise her exposure history doesn't suggest anything that would definitively lead to obstructive lung disease. The differential diagnosis includes idiopathic emphysema versus asthma. Given her pronounced inspiratory phase of wheezing on physical exam I am concerned about extrathoracic tracheal air column obstruction but I cannot visualize that on her chest x-ray nor does she have any palpable masses on neck exam today.  Plan Continue current medicines as directed by primary care physician Pulmonary function testing now CT chest today Follow-up next week to go over these results    Current Outpatient Prescriptions:  .  ADVAIR DISKUS 250-50 MCG/DOSE AEPB, INHALE ONE PUFF BY MOUTH TWICE DAILY, Disp: 60 each, Rfl: 1 .  albuterol (PROVENTIL) (2.5 MG/3ML) 0.083% nebulizer solution, USE ONE VIAL IN NEBULIZER EVERY 6 HOURS AS NEEDED FOR WHEEZING OR SHORTNESS OF BREATH, Disp: 75 mL, Rfl: 4 .  aspirin  81 MG tablet, Take 81 mg by mouth daily. Buys OTC, Disp: , Rfl:  .  budesonide (PULMICORT) 0.5 MG/2ML nebulizer solution, Take 2 mLs (0.5 mg total) by nebulization 2 (two) times daily., Disp: 120 mL, Rfl: 12 .  cholecalciferol (VITAMIN D) 1000 UNITS tablet, Take 5 tablets (5,000 Units total) by mouth daily., Disp: , Rfl:  .  FLUoxetine (PROZAC) 40 MG capsule, TAKE 1 CAPSULE EVERY DAY, Disp: 90 capsule, Rfl: 3 .  furosemide (LASIX) 40 MG tablet, TAKE 1 TABLET EVERY DAY, Disp: 90 tablet, Rfl: 3 .  montelukast (SINGULAIR) 10 MG tablet, Take 1 tablet (10 mg total) by mouth daily., Disp: 30 tablet, Rfl: 2 .  Multiple Vitamin (MULTIVITAMIN) capsule, Take 1 capsule by mouth daily. , Disp: , Rfl:  .  nystatin (MYCOSTATIN) 100000 UNIT/ML suspension, SWISH AND SPIT  5 ML BY MOUTH 4 TIMES DAILY FOR 2 DAYS AFTER SYMPTOMS RESOLVE, Disp: 240 mL, Rfl: 3 .  omeprazole (PRILOSEC) 40 MG capsule, TAKE 1 CAPSULE TWICE DAILY  FOR  ACID  REFLUX, Disp: 180 capsule, Rfl: 11 .  oxybutynin (DITROPAN) 5 MG tablet, TAKE ONE TABLET BY MOUTH ONCE DAILY, Disp: 30 tablet, Rfl: 2 .  potassium chloride SA (K-DUR,KLOR-CON) 20 MEQ tablet, Take 1 tablet (20 mEq total) by mouth 2 (two) times daily., Disp: 60 tablet, Rfl: 1 .  pravastatin (PRAVACHOL) 40 MG tablet, TAKE 1 TABLET EVERY DAY FOR CHOLESTEROL, Disp: 90 tablet, Rfl: 11 .  predniSONE (DELTASONE) 20 MG tablet, Take 1 tablet daily with breakfast, Disp: 30 tablet, Rfl: 1 .  traZODone (DESYREL) 50 MG tablet, 1/2-1 tablet for sleep, Disp: 30 tablet, Rfl: 2

## 2016-11-19 NOTE — Progress Notes (Signed)
   Subjective:    Patient ID: Marie [name], female    DOB: [birth year], 81 y.o.   MRN: [id number]  HPI    Review of Systems  Constitutional: Negative for fever and unexpected weight change.  HENT: Positive for congestion. Negative for dental problem, ear pain, nosebleeds, postnasal drip, rhinorrhea, sinus pressure, sneezing, sore throat and trouble swallowing.   Eyes: Negative for redness and itching.  Respiratory: Positive for cough, chest tightness and shortness of breath. Negative for wheezing.   Cardiovascular: Negative for palpitations and leg swelling.  Gastrointestinal: Negative for nausea and vomiting.  Genitourinary: Negative for dysuria.  Musculoskeletal: Negative for joint swelling.  Skin: Negative for rash.  Neurological: Negative for headaches.  Hematological: Does not bruise/bleed easily.  Psychiatric/Behavioral: Negative for dysphoric mood. The patient is not nervous/anxious.        Objective:   Physical Exam        Assessment & Plan:

## 2016-11-23 ENCOUNTER — Telehealth: Payer: Self-pay | Admitting: Pulmonary Disease

## 2016-11-23 ENCOUNTER — Other Ambulatory Visit: Payer: Self-pay | Admitting: Internal Medicine

## 2016-11-23 DIAGNOSIS — R062 Wheezing: Secondary | ICD-10-CM

## 2016-11-23 NOTE — Telephone Encounter (Signed)
Spoke with pt, states that wheezing is not improved since last week's OV- pt c/o SOB, wheezing with exertion, sometimes prod cough with yellow mucus.  Pt denies fever, sinus congestion, worsening fatigue.  Pt has been taking maintenance meds to help with symptoms. Requesting further recs.  Pt uses [facility] in [place].  Sending to DOD as BQ is night float.  AD please advise on further recs.  Thanks.

## 2016-11-23 NOTE — Progress Notes (Signed)
Chest CT really unrevealing. Patient seen by Dr. Lake Bells with [facility] who feel that the patient is not have a lower airway wheeze and is actually an upper airway wheeze.  Will refer to ENT.  Will not send any other abx in as these are not helpful.

## 2016-11-23 NOTE — Telephone Encounter (Signed)
Spoke with pt. She is aware of AD's recommendation. Pt has an appointment with TP on 11/27/16. Pt was instructed to keep this appointment. She is currently taking Prednisone 20mg  daily. Nothing further was needed.

## 2016-11-23 NOTE — Telephone Encounter (Signed)
   Per Ruby Cola, pt needs a follow up in 1-2 weeks. pls schedule.  Can we ask if pt is on prednisone?  If not, pls call in prednisone 20 mg daily for 5 days.  Cont other meds.   If pt is already on pred, tell him cont pred and needs a f/u in office this week so we can really determine why she is wheezing.   Monica Becton, MD 11/23/2016, 2:43 PM  Pulmonary and Critical Care Pager [phone] After 3 pm or if no answer, call [phone]

## 2016-11-27 ENCOUNTER — Encounter: Payer: Self-pay | Admitting: Adult Health

## 2016-11-27 ENCOUNTER — Ambulatory Visit (HOSPITAL_COMMUNITY)
Admission: RE | Admit: 2016-11-27 | Discharge: 2016-11-27 | Disposition: A | Discharge status: Home / Self Care | Payer: Medicare Other | Source: Ambulatory Visit | Attending: Pulmonary Disease | Admitting: Pulmonary Disease

## 2016-11-27 ENCOUNTER — Ambulatory Visit (INDEPENDENT_AMBULATORY_CARE_PROVIDER_SITE_OTHER): Payer: Medicare Other | Admitting: Adult Health

## 2016-11-27 DIAGNOSIS — J4520 Mild intermittent asthma, uncomplicated: Secondary | ICD-10-CM | POA: Diagnosis not present

## 2016-11-27 DIAGNOSIS — R0602 Shortness of breath: Secondary | ICD-10-CM | POA: Insufficient documentation

## 2016-11-27 DIAGNOSIS — R062 Wheezing: Secondary | ICD-10-CM

## 2016-11-27 LAB — PULMONARY FUNCTION TEST
DL/VA % PRED: 83 %
DL/VA: 3.89 ml/min/mmHg/L
DLCO UNC: 10.59 ml/min/mmHg
DLCO unc % pred: 46 %
FEF 25-75 Post: 0.98 L/sec
FEF 25-75 Pre: 1.09 L/sec
FEF2575-%Change-Post: -9 %
FEF2575-%PRED-PRE: 75 %
FEF2575-%Pred-Post: 68 %
FEV1-%Change-Post: -1 %
FEV1-%PRED-POST: 65 %
FEV1-%PRED-PRE: 65 %
FEV1-PRE: 1.25 L
FEV1-Post: 1.24 L
FEV1FVC-%Change-Post: 6 %
FEV1FVC-%Pred-Pre: 105 %
FEV6-%Change-Post: -6 %
FEV6-%PRED-PRE: 66 %
FEV6-%Pred-Post: 61 %
FEV6-POST: 1.5 L
FEV6-PRE: 1.61 L
FEV6FVC-%PRED-POST: 105 %
FEV6FVC-%PRED-PRE: 105 %
FVC-%Change-Post: -6 %
FVC-%Pred-Post: 58 %
FVC-%Pred-Pre: 63 %
FVC-POST: 1.5 L
FVC-Pre: 1.61 L
PRE FEV1/FVC RATIO: 78 %
Post FEV1/FVC ratio: 83 %
Post FEV6/FVC ratio: 100 %
Pre FEV6/FVC Ratio: 100 %
RV % pred: 79 %
RV: 1.84 L
TLC % PRED: 67 %
TLC: 3.31 L

## 2016-11-27 MED ORDER — MONTELUKAST SODIUM 10 MG PO TABS: 10.0000 mg | ORAL_TABLET | Freq: Every day | ORAL | 2 refills | Status: DC

## 2016-11-27 MED ORDER — ALBUTEROL SULFATE (2.5 MG/3ML) 0.083% IN NEBU
2.5000 mg | INHALATION_SOLUTION | Freq: Once | RESPIRATORY_TRACT | Status: AC
Start: 2016-11-27 — End: 2016-11-27
  Administered 2016-11-27: 2.5 mg via RESPIRATORY_TRACT

## 2016-11-27 NOTE — Patient Instructions (Addendum)
Remain off Advair .  Continue on Pulmicort Neb Twice daily  , rinse after use  Decrease Prednisone 10mg  1/2 daily for 2 weeks then 1/2 every other day for 2 weeks and stop .  Continue on Prilosec 20mg  Twice daily  .  Begin Delsym 2 tsp Twice daily  For cough .  Begin Claritin 10mg  daily .  Follow up Dr. Lake Bells in 6-8 weeks and As needed   Please contact office for sooner follow up if symptoms do not improve or worsen or seek emergency care    Late add : Will hold prednisone 10mg  1/2 daily -unsure the reason why she is on chronic steroids. -pt aware

## 2016-11-27 NOTE — Progress Notes (Signed)
@Patient  ID: Marie [name], female    DOB: 07-Nov-1935, 81 y.o.   MRN: [id number]  Chief Complaint  Patient presents with  . Follow-up    Wheezing     Referring provider: [name], MD  HPI: 81 yo female seen for pulmonary consult for COPD 11/19/16 . She is a never smoker .   12/03/2016 Follow up : Wheezing /PFT results  Pt returns for a 2 week follow up . Seen last ov for pulmonary consult for wheezing and COPD . She is a never smoker. She was set up for PFT and CT chest .  PFT done today showed moderate restriction with FEV1 65%, ratio 83, FVC 58%, and DLCO 67%. CT chest done on 2/1 showed Bilateral apical scarring. No bronchiectasis or significant fibrotic changes. Mild bronchitic changes in the lower lobes and right middle lobe. She complains of dry cough and drainage in throat.  Says reflux is well controlled on PPI . Remains on singulair daily .  She was on Advair previously but now on pulmicort neb Twice daily  .  She has been on prednisone 10mg  for >1 year.     Allergies  Allergen Reactions  . Ertapenem Hives    Caused whole body to turn red Other reaction(s): Redness Caused whole body to turn red  . Codeine Other (See Comments)    hallucinations  . Demerol Other (See Comments)    hallucinations  . Morphine And Related Other (See Comments)    hallucinations  . Other Other (See Comments)    Invan 7- pt became red all over  . Sulfate Other (See Comments)    unknown    Immunization History  Administered Date(s) Administered  . Influenza, High Dose Seasonal PF 08/23/2014, 07/24/2015, 08/13/2016  . Pneumococcal Conjugate-13 08/23/2014  . Pneumococcal-Unspecified 07/20/2003, 08/17/2013  . Td 08/17/2013  . Tdap 10/25/2012  . Zoster 03/19/2015    Past Medical History:  Diagnosis Date  . Balance disorder 2008.     Falls a lot.  She can be standing and then leans too far over to one side   . Bulging disc   . Chronic bronchitis ([place])    due to  congestion at times, on prednisone and advair  . COPD (chronic obstructive pulmonary disease) ([place])   . Depression    many years ago  . GERD (gastroesophageal reflux disease)   . Hearing loss    mild  . Hyperlipidemia   . Hypertension   . Iatrogenic adrenal insufficiency ([place])   . Incontinence    not indicated at this visit.  Marland Kitchen Neuropathy ([facility])    legs stay numb   . Osteoarthritis    hands,   . Osteoporosis   . Shortness of breath dyspnea   . Tubulovillous adenoma of rectum   . Wears dentures   . Wears glasses     Tobacco History: History  Smoking Status  . Never Smoker  Smokeless Tobacco  . Never Used   Counseling given: Not Answered   Outpatient Encounter Prescriptions as of 11/27/2016  Medication Sig  . ADVAIR DISKUS 250-50 MCG/DOSE AEPB INHALE ONE PUFF BY MOUTH TWICE DAILY  . albuterol (PROVENTIL) (2.5 MG/3ML) 0.083% nebulizer solution USE ONE VIAL IN NEBULIZER EVERY 6 HOURS AS NEEDED FOR WHEEZING OR SHORTNESS OF BREATH  . aspirin 81 MG tablet Take 81 mg by mouth daily. Buys OTC  . budesonide (PULMICORT) 0.5 MG/2ML nebulizer solution Take 2 mLs (0.5 mg total) by nebulization 2 (two) times daily.  Marland Kitchen  cholecalciferol (VITAMIN D) 1000 UNITS tablet Take 5 tablets (5,000 Units total) by mouth daily.  Marland Kitchen FLUoxetine (PROZAC) 40 MG capsule TAKE 1 CAPSULE EVERY DAY  . furosemide (LASIX) 40 MG tablet TAKE 1 TABLET EVERY DAY  . montelukast (SINGULAIR) 10 MG tablet Take 1 tablet (10 mg total) by mouth daily.  . Multiple Vitamin (MULTIVITAMIN) capsule Take 1 capsule by mouth daily.   Marland Kitchen nystatin (MYCOSTATIN) 100000 UNIT/ML suspension SWISH AND SPIT  5 ML BY MOUTH 4 TIMES DAILY FOR 2 DAYS AFTER SYMPTOMS RESOLVE  . omeprazole (PRILOSEC) 40 MG capsule TAKE 1 CAPSULE TWICE DAILY  FOR  ACID  REFLUX  . oxybutynin (DITROPAN) 5 MG tablet TAKE ONE TABLET BY MOUTH ONCE DAILY  . potassium chloride SA (K-DUR,KLOR-CON) 20 MEQ tablet Take 1 tablet (20 mEq total) by mouth 2 (two) times daily.  .  pravastatin (PRAVACHOL) 40 MG tablet TAKE 1 TABLET EVERY DAY FOR CHOLESTEROL  . predniSONE (DELTASONE) 20 MG tablet Take 1 tablet daily with breakfast  . traZODone (DESYREL) 50 MG tablet 1/2-1 tablet for sleep  . [DISCONTINUED] montelukast (SINGULAIR) 10 MG tablet Take 1 tablet (10 mg total) by mouth daily.   No facility-administered encounter medications on file as of 11/27/2016.      Review of Systems  Constitutional:   No  weight loss, night sweats,  Fevers, chills,  +fatigue, or  lassitude.  HEENT:   No headaches,  Difficulty swallowing,  Tooth/dental problems, or  Sore throat,                No sneezing, itching, ear ache,  +nasal congestion, post nasal drip,   CV:  No chest pain,  Orthopnea, PND, swelling in lower extremities, anasarca, dizziness, palpitations, syncope.   GI  No heartburn, indigestion, abdominal pain, nausea, vomiting, diarrhea, change in bowel habits, loss of appetite, bloody stools.   Resp:    No chest wall deformity  Skin: no rash or lesions.  GU: no dysuria, change in color of urine, no urgency or frequency.  No flank pain, no hematuria   MS:  No joint pain or swelling.  No decreased range of motion.  No back pain.    Physical Exam  BP 138/66 (BP Location: Left Arm, Cuff Size: Normal)   Pulse 74   Ht 5\' 4"  (1.626 m)   Wt 154 lb (69.9 kg)   SpO2 95%   BMI 26.43 kg/m   GEN: A/Ox3; pleasant , NAD, elderly    HEENT:  [place]/AT,  EACs-clear, TMs-wnl, NOSE-clear drainage  THROAT-clear, no lesions, no postnasal drip or exudate noted.   NECK:  Supple w/ fair ROM; no JVD; normal carotid impulses w/o bruits; no thyromegaly or nodules palpated; no lymphadenopathy.    RESP  Clear  P & A; w/o, wheezes/ rales/ or rhonchi. no accessory muscle use, no dullness to percussion  CARD:  RRR, no m/r/g, no peripheral edema, pulses intact, no cyanosis or clubbing.  GI:   Soft & nt; nml bowel sounds; no organomegaly or masses detected.   Musco: Warm bil, no  deformities or joint swelling noted. Arthritic changes in hands.   Neuro: alert, no focal deficits noted.    Skin: Warm, no lesions or rashes    Lab Results:  CBC  Imaging: Dg Chest 2 View  Result Date: 11/17/2016 CLINICAL DATA:  Productive cough. EXAM: CHEST  2 VIEW COMPARISON:  04/16/2015. FINDINGS: Mediastinum hilar structures are normal. Lungs are clear. No pleural effusion or pneumothorax. Biapical pleural thickening noted  consistent with scarring. Diffuse osteopenia degenerative change. IMPRESSION: No acute cardiopulmonary disease. Electronically Signed   By: Marcello Moores  Register   On: 11/17/2016 06:42   Ct Chest Wo Contrast  Result Date: 11/19/2016 CLINICAL DATA:  COPD exacerbation, wheezing, fatigue for 2 weeks, chronic cough EXAM: CT CHEST WITHOUT CONTRAST TECHNIQUE: Multidetector CT imaging of the chest was performed following the standard protocol without IV contrast. COMPARISON:  CT chest 04/28/2013 FINDINGS: Cardiovascular: Atherosclerotic calcifications of thoracic aorta. Atherosclerotic calcifications of coronary arteries. Heart size within normal limits. No pericardial effusion. Unenhanced central pulmonary artery is unremarkable. Mediastinum/Nodes: There is no mediastinal hematoma or adenopathy. No hilar adenopathy is noted on this unenhanced scan. Small hiatal hernia. Lungs/Pleura: Images of the lung parenchyma shows minimal hyperinflation. Stable bilateral apical pleuroparenchymal scarring. No infiltrate or pulmonary edema. No bronchiectasis. No fibrotic changes are noted. Small atelectasis or scarring in left base anteromedially axial image 42. There is no pneumothorax. No focal consolidation. Mild perihilar bronchitic/that mild infrahilar bronchitic changes especially in lower lobes and right middle lobe. Upper Abdomen: The visualized upper abdomen shows no adrenal gland mass. Atherosclerotic calcifications of abdominal aorta. Visualized spleen and liver is unremarkable.  Musculoskeletal: No destructive bony lesions are noted. Sagittal images of the spine shows osteopenia. Mild degenerative changes mid and lower thoracic spine. IMPRESSION: 1. No acute infiltrate or pulmonary edema. Mild bronchitic changes infrahilar lower lobes and right middle lobe. 2. No mediastinal hematoma or adenopathy. 3. Minimal hyperinflation. Stable bilateral apical pleuroparenchymal scarring. No bronchiectasis or significant fibrotic changes. Minimal atelectasis or scarring left base anteromedially. 4. Mild degenerative changes mid and lower thoracic spine. 5. Atherosclerotic calcifications of thoracic aorta, abdominal aorta and coronary arteries. Small hiatal hernia. Electronically Signed   By: Lahoma Crocker M.D.   On: 11/19/2016 12:05     Assessment & Plan:   Asthma Possible asthma component in never smoker  Suspect triggers of sinus drainage may be aggravating her cough  PFT do not show any significant obstruction , mostly restricted compoenent. No sign BD response.  Will continue on pulmicort for now .  Treat for triggers.  CT chest does not suggest ILD changes but if sx persist may need to consider HRCT chest .  Will try to taper slowly off steroids -hold off on weaning off unclear the reason she has been on for >1 yr .   Plan  Patient Instructions  Remain off Advair .  Continue on Pulmicort Neb Twice daily  , rinse after use  Decrease Prednisone 10mg  1/2 daily -hold at this dose  Continue on Prilosec 20mg  Twice daily  .  Begin Delsym 2 tsp Twice daily  For cough .  Begin Claritin 10mg  daily .  Follow up Dr. Lake Bells in 6-8 weeks and As needed   Please contact office for sooner follow up if symptoms do not improve or worsen or seek emergency care         Rexene Edison, NP 12/03/2016

## 2016-12-03 NOTE — Assessment & Plan Note (Addendum)
Possible asthma component in never smoker  Suspect triggers of sinus drainage may be aggravating her cough  PFT do not show any significant obstruction , mostly restricted compoenent. No sign BD response.  Will continue on pulmicort for now .  Treat for triggers.  CT chest does not suggest ILD changes but if sx persist may need to consider HRCT chest .  Will try to taper slowly off steroids -hold off on weaning off unclear the reason she has been on for >1 yr .   Plan  Patient Instructions  Remain off Advair .  Continue on Pulmicort Neb Twice daily  , rinse after use  Decrease Prednisone 10mg  1/2 daily -hold at this dose  Continue on Prilosec 20mg  Twice daily  .  Begin Delsym 2 tsp Twice daily  For cough .  Begin Claritin 10mg  daily .  Follow up Dr. [place] Bells in 6-8 weeks and As needed   Please contact office for sooner follow up if symptoms do not improve or worsen or seek emergency care

## 2016-12-04 ENCOUNTER — Ambulatory Visit (INDEPENDENT_AMBULATORY_CARE_PROVIDER_SITE_OTHER): Payer: Medicare Other | Admitting: Pulmonary Disease

## 2016-12-04 ENCOUNTER — Encounter: Payer: Self-pay | Admitting: Pulmonary Disease

## 2016-12-04 ENCOUNTER — Telehealth: Payer: Self-pay | Admitting: Pulmonary Disease

## 2016-12-04 VITALS — BP 122/64 | HR 89 | Temp 97.4°F | Ht 64.0 in | Wt 150.0 lb

## 2016-12-04 DIAGNOSIS — R062 Wheezing: Secondary | ICD-10-CM | POA: Diagnosis not present

## 2016-12-04 DIAGNOSIS — R0600 Dyspnea, unspecified: Secondary | ICD-10-CM | POA: Diagnosis not present

## 2016-12-04 DIAGNOSIS — R1310 Dysphagia, unspecified: Secondary | ICD-10-CM

## 2016-12-04 DIAGNOSIS — R011 Cardiac murmur, unspecified: Secondary | ICD-10-CM | POA: Diagnosis not present

## 2016-12-04 MED ORDER — SODIUM CHLORIDE 3 % IN NEBU: INHALATION_SOLUTION | RESPIRATORY_TRACT | 12 refills | Status: DC | PRN

## 2016-12-04 MED ORDER — DOXYCYCLINE HYCLATE 100 MG PO TABS: 100.0000 mg | ORAL_TABLET | Freq: Two times a day (BID) | ORAL | 0 refills | Status: DC

## 2016-12-04 NOTE — Progress Notes (Signed)
Subjective:    Patient ID: Marie [name], female    DOB: 1936-01-18, 81 y.o.   MRN: [id number]  Synopsis: Referred in 2018 for evaluation of wheezing.  HPI Chief Complaint  Patient presents with  . Acute Visit    pt c/o unchanged wheezing, sob with any exertion, prod cough with intermittent white/clear/yellow mucus.  Pt saw TP on 11/27/16- has had no improvement since this visit.     Bula notes a lot of coughing and wheezing, most of the time it is a dry cough.  She has no fever.  Only rare clear mucus production.  No clear exacerbating or relieving factors.  No chest pain recently.  She says that she is feeling stronger in terms of her muscle strength.  She was walking with her walker at home.  She says that sometimes she just "can't swallow" and can't make the food go down.  This occurs primarily with pills, but she has it with foods both solid and liquids.  She says that an albuterol nebulizer helps her break up the mucus.   Past Medical History:  Diagnosis Date  . Balance disorder 2008.     Falls a lot.  She can be standing and then leans too far over to one side   . Bulging disc   . Chronic bronchitis ([place])    due to congestion at times, on prednisone and advair  . COPD (chronic obstructive pulmonary disease) ([place])   . Depression    many years ago  . GERD (gastroesophageal reflux disease)   . Hearing loss    mild  . Hyperlipidemia   . Hypertension   . Iatrogenic adrenal insufficiency ([place])   . Incontinence    not indicated at this visit.  Marland Kitchen Neuropathy ([facility])    legs stay numb   . Osteoarthritis    hands,   . Osteoporosis   . Shortness of breath dyspnea   . Tubulovillous adenoma of rectum   . Wears dentures   . Wears glasses       Review of Systems  Constitutional: Positive for fatigue. Negative for chills and fever.  HENT: Negative for rhinorrhea, sinus pain and sinus pressure.   Respiratory: Positive for cough, shortness of breath and wheezing. Negative  for choking.   Cardiovascular: Negative for chest pain, palpitations and leg swelling.       Objective:   Physical Exam Vitals:   12/04/16 1626  BP: 122/64  Pulse: 89  Temp: 97.4 F (36.3 C)  TempSrc: Oral  SpO2: 93%  Weight: 150 lb (68 kg)  Height: 5\' 4"  (1.626 m)   Gen: chonically ill appearing HENT: OP clear, TM's clear, neck supple PULM: Some wheezing bilaterally, normal percussion CV: RRR, systolic murmur RUSB, trace edema GI: BS+, soft, nontender Derm: no cyanosis or rash Psyche: normal mood and affect   Lung function testing: February 2018 for pulmonary function tests: Ratio 83%, FEV1 1.24 L 65% predicted, FVC 1.50 L 58% predicted, some change in small airways with bronchodilator, total lung capacity 3.31 L 67% predicted, ERV 13% predicted, DLCO 10.5 946% predicted  CT chest in 2018 images personally reviewed showing no evidence of a pulmonary parenchymal abnormality with the exception of what appears to be mucus plugging in a central left lower lobe airway, but no other surrounding atelectasis or mass.      Assessment & Plan:  Impression: Wheezing Shortness of breath Cough Chest congestion Dysphagia  Discussion: Frankly, I'm having a hard time figuring out  what is going on with Ms. Hiler. While its possible she may have some degree of asthma, I'm surprised that there is no significant airflow obstruction seen on her lung function testing. Lung function testing reveals restrictive lung disease, exam shows some wheezing, and her CT chest shows what appears to be mucus plugging in the left lower lobe. With further history today she notes that she's been having dysphagia and some weakness lately. I worry about oral pharyngeal dysphagia leading to recurrent aspiration given her restrictive lung disease also worry about neuromuscular causes of her shortness of breath and restrictive lung disease as there is no clear pulmonary parenchymal disease seen on her CT chest. I  also worry that her wheezing may be due to an upper airway abnormalities think she needs to be seen by ear nose and throat.  Most of her symptoms seem to be related to chest congestion with mucus retentions I'm going to add hypertonic saline today to see if this helps her clear out some of the mucus and improve her symptoms. Given her recent acute uptake and symptoms I will add an antibiotic for the next 5 days.  For your wheezing and chest congestion: Keep taking prednisone at her current dose for now We will arrange for you to see an ear nose and throat physician for them to take a look at her vocal cords Take hypertonic saline twice a day Take mucinex twice a day Keep using albuterol on an as-needed basis Keep taking Advair as you are doing Doxycycline 100mg  po bid x 5 days  For your trouble swallowing: We will arrange for a speech therapy evaluation  For your shortness of breath: We will arrange for something called and inspiratory and expiratory pressure test Keep taking your inhaled medicines as you are doing  For your heart murmur: We will check an echocardiogram  We will see you back in a few weeks as previously scheduled   Current Outpatient Prescriptions:  .  ADVAIR DISKUS 250-50 MCG/DOSE AEPB, INHALE ONE PUFF BY MOUTH TWICE DAILY, Disp: 60 each, Rfl: 1 .  albuterol (PROVENTIL) (2.5 MG/3ML) 0.083% nebulizer solution, USE ONE VIAL IN NEBULIZER EVERY 6 HOURS AS NEEDED FOR WHEEZING OR SHORTNESS OF BREATH, Disp: 75 mL, Rfl: 4 .  aspirin 81 MG tablet, Take 81 mg by mouth daily. Buys OTC, Disp: , Rfl:  .  budesonide (PULMICORT) 0.5 MG/2ML nebulizer solution, Take 2 mLs (0.5 mg total) by nebulization 2 (two) times daily., Disp: 120 mL, Rfl: 12 .  cholecalciferol (VITAMIN D) 1000 UNITS tablet, Take 5 tablets (5,000 Units total) by mouth daily., Disp: , Rfl:  .  furosemide (LASIX) 40 MG tablet, TAKE 1 TABLET EVERY DAY, Disp: 90 tablet, Rfl: 3 .  montelukast (SINGULAIR) 10 MG tablet,  Take 1 tablet (10 mg total) by mouth daily., Disp: 30 tablet, Rfl: 2 .  Multiple Vitamin (MULTIVITAMIN) capsule, Take 1 capsule by mouth daily. , Disp: , Rfl:  .  nystatin (MYCOSTATIN) 100000 UNIT/ML suspension, SWISH AND SPIT  5 ML BY MOUTH 4 TIMES DAILY FOR 2 DAYS AFTER SYMPTOMS RESOLVE, Disp: 240 mL, Rfl: 3 .  omeprazole (PRILOSEC) 40 MG capsule, TAKE 1 CAPSULE TWICE DAILY  FOR  ACID  REFLUX, Disp: 180 capsule, Rfl: 11 .  oxybutynin (DITROPAN) 5 MG tablet, TAKE ONE TABLET BY MOUTH ONCE DAILY, Disp: 30 tablet, Rfl: 2 .  potassium chloride SA (K-DUR,KLOR-CON) 20 MEQ tablet, Take 1 tablet (20 mEq total) by mouth 2 (two) times daily., Disp: 60  tablet, Rfl: 1 .  pravastatin (PRAVACHOL) 40 MG tablet, TAKE 1 TABLET EVERY DAY FOR CHOLESTEROL, Disp: 90 tablet, Rfl: 11 .  predniSONE (DELTASONE) 20 MG tablet, Take 1 tablet daily with breakfast, Disp: 30 tablet, Rfl: 1 .  traZODone (DESYREL) 50 MG tablet, 1/2-1 tablet for sleep, Disp: 30 tablet, Rfl: 2

## 2016-12-04 NOTE — Telephone Encounter (Signed)
Spoke with pt's husband. Pt is not improving since her appointment on 11/27/16. She has been scheduled to see BQ today at 4:15pm. Nothing further was needed.

## 2016-12-04 NOTE — Patient Instructions (Signed)
For your wheezing and chest congestion: Keep taking prednisone at her current dose for now We will arrange for you to see an ear nose and throat physician for them to take a look at her vocal cords Take hypertonic saline twice a day Take mucinex twice a day Keep using albuterol on an as-needed basis Keep taking Advair as you are doing Doxycycline 100mg  po bid x 5 days  For your trouble swallowing: We will arrange for a speech therapy evaluation  For your shortness of breath: We will arrange for something called and inspiratory and expiratory pressure test Keep taking your inhaled medicines as you are doing  For your heart murmur: We will check an echocardiogram  We will see you back in a few weeks as previously scheduled

## 2016-12-08 ENCOUNTER — Other Ambulatory Visit: Payer: Self-pay | Admitting: *Deleted

## 2016-12-08 ENCOUNTER — Telehealth: Payer: Self-pay | Admitting: Pulmonary Disease

## 2016-12-08 MED ORDER — SODIUM CHLORIDE 3 % IN NEBU: INHALATION_SOLUTION | RESPIRATORY_TRACT | 12 refills | Status: DC

## 2016-12-08 NOTE — Telephone Encounter (Signed)
Pharm calling w/question a/b script that was just sent over they can be reached @ [phone].Hillery Hunter

## 2016-12-08 NOTE — Telephone Encounter (Signed)
Pt's husband is referring to pt's hypertonic neb solution. Pt is aware that I have resent this prescription to the pharmacy with the proper documentation.  BQ - please advise on the first message taken from the pt's husband. Thanks.

## 2016-12-08 NOTE — Telephone Encounter (Signed)
Spoke with pt. States that she is needing a refill on Doxycycline. Advised him that we do not typically put refills on antibiotics when we send them. He states that the pt is not feeling any better since starting the antibiotic, she has 1 more day of Doxy. She is still having the same symptoms that she had when she saw BQ on Friday. Pt's husband also questioned the hypertonic neb solution, advised him that this was sent to [facility] on Friday along with the pt's antibiotic.  BQ - please advise if you have any further recommendations for the pt. Thanks.

## 2016-12-08 NOTE — Telephone Encounter (Signed)
Patient husband [name] called and states that rx that was called into [facility] in [place] is missing a code per pharmacy - he doesn't know which medicine. He can be reached at [phone]

## 2016-12-08 NOTE — Telephone Encounter (Signed)
Spoke with pt. States that she will wait to see if she improves any. Pt does not want to come in for another appointment right now. Nothing further was needed.

## 2016-12-08 NOTE — Telephone Encounter (Signed)
Sounds like she needs an Acute OV

## 2016-12-08 NOTE — Telephone Encounter (Signed)
Spoke with [place] at [facility]. Prescription has been fixed again.

## 2016-12-09 ENCOUNTER — Other Ambulatory Visit (HOSPITAL_COMMUNITY): Payer: Self-pay | Admitting: Pulmonary Disease

## 2016-12-09 DIAGNOSIS — R1319 Other dysphagia: Secondary | ICD-10-CM

## 2016-12-10 ENCOUNTER — Ambulatory Visit: Payer: Self-pay | Admitting: Internal Medicine

## 2016-12-10 ENCOUNTER — Encounter: Payer: Self-pay | Admitting: Internal Medicine

## 2016-12-10 ENCOUNTER — Ambulatory Visit (INDEPENDENT_AMBULATORY_CARE_PROVIDER_SITE_OTHER): Payer: Medicare Other | Admitting: Internal Medicine

## 2016-12-10 VITALS — BP 120/84 | HR 81 | Temp 97.7°F | Resp 16 | Wt 150.0 lb

## 2016-12-10 DIAGNOSIS — B370 Candidal stomatitis: Secondary | ICD-10-CM

## 2016-12-10 DIAGNOSIS — R062 Wheezing: Secondary | ICD-10-CM

## 2016-12-10 MED ORDER — NYSTATIN 100000 UNIT/ML MT SUSP: 5.0000 mL | Freq: Four times a day (QID) | OROMUCOSAL | 3 refills | Status: DC

## 2016-12-10 NOTE — Patient Instructions (Signed)
Please call Dr. Redmond Baseman' office to see if you can get on the cancellation appointment list.    Please call Dr. Lake Bells and let him know that you cannot tolerate a barium swallow study that it makes you sick.  Please continue to use the advair twice daily.   You can use the albuterol up to 4 times per day as needed for shortness of breath and wheezing.  Continue the mucinex twice daily to help break up mucuous.  Please use nystatin 4 times per day.  You will swish it and hold it in your mouth for 30 seconds.  Then spit it out.  Please take out your denture plates when you do this.    Please spit any kind of sputum you have into the urine container and bring it to me because we will then be able to culture it.

## 2016-12-10 NOTE — Progress Notes (Signed)
Assessment and Plan:   1. Inspiratory wheeze on examination -thorough workup done by pulmonology without evidence of significant lung disease -needs further workup done by ENT -Abx have been used without relief and no further antibiotics really warranted as no evidence of active infection -suspect some form of tracheomalacia vs. Vocal cord dysfunction -suggested that they can do sputum sample if she brings anything up  2.  Thrush -oral nystatin solution -take dentures out while performing rinse    HPI 81 y.o.female presents to the office for evaluation of continued wheezing.  She has been doing 5 days worth of doxycycline.  She reports that she is frustrated because nothing is helping.  She reports that she feels like it doesn't help.  She states that he gave her the same things that we have been giving here.  She is due for a barium swallow on the 27th and also an appointment with Dr. Redmond Baseman on 12/22/16.  She is not coughing anything up currently.   She is still wheezing.  She states that she is still wheezing most of the time.  She does not thing there is post nasal drainage.  No fevers or chills.  She has been using the albuterol twice per day currently.  She is still on 20 mg of prednisone.  She is still doing advair twice daily.  She is taking mucinex twice daily as well.   She reports that   Past Medical History:  Diagnosis Date  . Balance disorder 2008.     Falls a lot.  She can be standing and then leans too far over to one side   . Bulging disc   . Chronic bronchitis ([place])    due to congestion at times, on prednisone and advair  . COPD (chronic obstructive pulmonary disease) ([place])   . Depression    many years ago  . GERD (gastroesophageal reflux disease)   . Hearing loss    mild  . Hyperlipidemia   . Hypertension   . Iatrogenic adrenal insufficiency ([place])   . Incontinence    not indicated at this visit.  Marland Kitchen Neuropathy ([facility])    legs stay numb   . Osteoarthritis    hands,    . Osteoporosis   . Shortness of breath dyspnea   . Tubulovillous adenoma of rectum   . Wears dentures   . Wears glasses      Allergies  Allergen Reactions  . Ertapenem Hives    Caused whole body to turn red Other reaction(s): Redness Caused whole body to turn red  . Codeine Other (See Comments)    hallucinations  . Demerol Other (See Comments)    hallucinations  . Morphine And Related Other (See Comments)    hallucinations  . Other Other (See Comments)    Invan 7- pt became red all over  . Sulfate Other (See Comments)    unknown      Current Outpatient Prescriptions on File Prior to Visit  Medication Sig Dispense Refill  . ADVAIR DISKUS 250-50 MCG/DOSE AEPB INHALE ONE PUFF BY MOUTH TWICE DAILY 60 each 1  . albuterol (PROVENTIL) (2.5 MG/3ML) 0.083% nebulizer solution USE ONE VIAL IN NEBULIZER EVERY 6 HOURS AS NEEDED FOR WHEEZING OR SHORTNESS OF BREATH 75 mL 4  . aspirin 81 MG tablet Take 81 mg by mouth daily. Buys OTC    . budesonide (PULMICORT) 0.5 MG/2ML nebulizer solution Take 2 mLs (0.5 mg total) by nebulization 2 (two) times daily. 120 mL 12  .  cholecalciferol (VITAMIN D) 1000 UNITS tablet Take 5 tablets (5,000 Units total) by mouth daily.    . furosemide (LASIX) 40 MG tablet TAKE 1 TABLET EVERY DAY 90 tablet 3  . montelukast (SINGULAIR) 10 MG tablet Take 1 tablet (10 mg total) by mouth daily. 30 tablet 2  . Multiple Vitamin (MULTIVITAMIN) capsule Take 1 capsule by mouth daily.     Marland Kitchen nystatin (MYCOSTATIN) 100000 UNIT/ML suspension SWISH AND SPIT  5 ML BY MOUTH 4 TIMES DAILY FOR 2 DAYS AFTER SYMPTOMS RESOLVE 240 mL 3  . omeprazole (PRILOSEC) 40 MG capsule TAKE 1 CAPSULE TWICE DAILY  FOR  ACID  REFLUX 180 capsule 11  . oxybutynin (DITROPAN) 5 MG tablet TAKE ONE TABLET BY MOUTH ONCE DAILY 30 tablet 2  . potassium chloride SA (K-DUR,KLOR-CON) 20 MEQ tablet Take 1 tablet (20 mEq total) by mouth 2 (two) times daily. 60 tablet 1  . pravastatin (PRAVACHOL) 40 MG tablet TAKE 1  TABLET EVERY DAY FOR CHOLESTEROL 90 tablet 11  . predniSONE (DELTASONE) 20 MG tablet Take 1 tablet daily with breakfast 30 tablet 1  . sodium chloride HYPERTONIC 3 % nebulizer solution Take 1 vial by nebulization BID 240 mL 12  . traZODone (DESYREL) 50 MG tablet 1/2-1 tablet for sleep 30 tablet 2   No current facility-administered medications on file prior to visit.     ROS: all negative except above.   Physical Exam: Filed Weights   12/10/16 1542  Weight: 150 lb (68 kg)   BP 120/84   Pulse 81   Temp 97.7 F (36.5 C)   Resp 16   Wt 150 lb (68 kg)   SpO2 95%   BMI 25.75 kg/m  General Appearance: Well developed well nourished, non-toxic appearing in no apparent distress. Eyes: PERRLA, EOMs, conjunctiva w/ no swelling or erythema or discharge Sinuses: No Frontal/maxillary tenderness ENT/Mouth: Ear canals clear without swelling or erythema.  TM's normal bilaterally with no retractions, bulging, or loss of landmarks.  White exudate present on tongue and cheeks. Easily scraped off with erythema below.  Neck: Supple, thyroid normal, no notable JVD.  Some palpable collapse of the trachea with inspiration.  Head held with some neck flexion all the time Respiratory: Respiratory effort normal, inspiratory wheeze present without significant expiratory wheeze Cardio: RRR with no MRGs.   Abdomen: Soft, + BS.  Non tender, no guarding, rebound, hernias, masses.  Musculoskeletal: Mild kyphosis with neck flexion present, Full ROM, 5/5 strength, Slow unsteady  Gait with walker.  Skin: Warm, dry without rashes  Neuro: Awake and oriented X 3, Cranial nerves intact. Normal muscle tone, no cerebellar symptoms. Sensation intact.  Psych: normal affect, Insight and Judgment appropriate.     Starlyn Skeans, PA-C 3:57 PM [facility]

## 2016-12-15 ENCOUNTER — Ambulatory Visit (HOSPITAL_COMMUNITY)
Admission: RE | Admit: 2016-12-15 | Discharge: 2016-12-15 | Disposition: A | Discharge status: Home / Self Care | Payer: Medicare Other | Source: Ambulatory Visit | Attending: Pulmonary Disease | Admitting: Pulmonary Disease

## 2016-12-15 DIAGNOSIS — R1319 Other dysphagia: Secondary | ICD-10-CM | POA: Insufficient documentation

## 2016-12-15 DIAGNOSIS — R011 Cardiac murmur, unspecified: Secondary | ICD-10-CM

## 2016-12-15 DIAGNOSIS — I071 Rheumatic tricuspid insufficiency: Secondary | ICD-10-CM | POA: Insufficient documentation

## 2016-12-15 DIAGNOSIS — R1310 Dysphagia, unspecified: Secondary | ICD-10-CM | POA: Insufficient documentation

## 2016-12-15 DIAGNOSIS — R05 Cough: Secondary | ICD-10-CM | POA: Diagnosis not present

## 2016-12-15 LAB — ECHOCARDIOGRAM COMPLETE
EERAT: 9.17
FS: 39 % (ref 28–44)
IV/PV OW: 1.22
LA diam end sys: 26 mm
LA diam index: 1.5 cm/m2
LA vol index: 24 mL/m2
LA vol: 41.5 mL
LASIZE: 26 mm
LAVOLA4C: 35.4 mL
LDCA: 2.84 cm2
LV E/e' medial: 9.17
LV E/e'average: 9.17
LV e' LATERAL: 6.42 cm/s
LVOT diameter: 19 mm
MV pk A vel: 112 m/s
MVPKEVEL: 58.9 m/s
PW: 8.7 mm — AB (ref 0.6–1.1)
RV LATERAL S' VELOCITY: 19.5 cm/s
TAPSE: 18.7 mm
TDI e' lateral: 6.42
TDI e' medial: 4.68

## 2016-12-15 NOTE — Procedures (Signed)
Objective Swallowing Evaluation: Type of Study: MBS-Modified Barium Swallow Study  Patient Details  Name: Marie [name] MRN: [id number] Date of Birth: 09/02/1936  Today's Date: 12/15/2016 Time: SLP Start Time (ACUTE ONLY): 1133-SLP Stop Time (ACUTE ONLY): 1212 SLP Time Calculation (min) (ACUTE ONLY): 39 min  Past Medical History:  Past Medical History:  Diagnosis Date  . Balance disorder 2008.     Falls a lot.  She can be standing and then leans too far over to one side   . Bulging disc   . Chronic bronchitis ([place])    due to congestion at times, on prednisone and advair  . COPD (chronic obstructive pulmonary disease) ([place])   . Depression    many years ago  . GERD (gastroesophageal reflux disease)   . Hearing loss    mild  . Hyperlipidemia   . Hypertension   . Iatrogenic adrenal insufficiency ([place])   . Incontinence    not indicated at this visit.  Marland Kitchen Neuropathy (H[facility])    legs stay numb   . Osteoarthritis    hands,   . Osteoporosis   . Shortness of breath dyspnea   . Tubulovillous adenoma of rectum   . Wears dentures   . Wears glasses    Past Surgical History:  Past Surgical History:  Procedure Laterality Date  . ABDOMINAL HYSTERECTOMY    . APPENDECTOMY    . CHONDROPLASTY  09/19/2014   Procedure: CHONDROPLASTY;  Surgeon: Alta Corning, MD;  Location: [place];  Service: Orthopedics;;  . DILATION AND CURETTAGE OF UTERUS    . EXTERNAL FIXATION LEG  10/25/2012   Procedure: EXTERNAL FIXATION LEG;  Surgeon: Rozanna Box, MD;  Location: [facility];  Service: Orthopedics;  Laterality: Right;  . EYE SURGERY     bilateral cataract surgery and lens implant  . FINGER SURGERY     fusions and debridements for OA  . INCONTINENCE SURGERY     multiple procedures, not cured  . KNEE ARTHROSCOPY WITH LATERAL MENISECTOMY Right 09/19/2014   Procedure: KNEE ARTHROSCOPY WITH LATERAL MENISECTOMY;  Surgeon: Alta Corning, MD;  Location: [facility];  Service:  Orthopedics;  Laterality: Right;  . KNEE ARTHROSCOPY WITH MEDIAL MENISECTOMY Right 09/19/2014   Procedure: RIGHT KNEE ARTHROSCOPY WITH MEDIAL AND LATERAL MENISECTOMIES. CHONDROPLASTY OF PATELLA-FEMORAL JOINT;  Surgeon: Alta Corning, MD;  Location: [facility];  Service: Orthopedics;  Laterality: Right;  . RECTAL BIOPSY  09/21/2011   Procedure: BIOPSY RECTAL;  Surgeon: Merrie Roof, MD;  Location: [facility];  Service: General;  Laterality: N/A;  3-4 cm  . RECTAL SURGERY     by dr. Marlou Starks, removal of polyp  . TONSILLECTOMY     HPI: 81 yo female with PMH significant for COPD, GERD, chronic cough, and wheezing, who presents for OP MBS at the recommendation of her pulmonologist. She reports frequent coughing, both during meals and otherwise, as well as difficulty starting oral transit. She denies any recent h/o PNA.  Subjective: pt alert, anxious about having to drink the barium   Assessment / Plan / Recommendation  CHL IP CLINICAL IMPRESSIONS 12/15/2016  Clinical Impression Pt presents with a moderate oral dysphagia, although study was kept brief due to pt vomiting. She has markedly prolonged oral phase with almost groping behaviors to attempt posterior oral transit with both thin liquids and purees. She has premature spillage of thin liquids that results in flash penetration before the swallow. Although it clears spontaneously, it  does still trigger a reflexive cough after the swallow. Given the above, recommend continuing regular diet and thin liquids as tolerated orally. Would take meds whole in puree and follow general aspiration precautions. Given significant oral deficits, may wish to consider neuro f/u.  SLP Visit Diagnosis Dysphagia, oral phase (R13.11)  Attention and concentration deficit following --  Frontal lobe and executive function deficit following --  Impact on safety and function Mild aspiration risk      CHL IP TREATMENT RECOMMENDATION 12/15/2016  Treatment  Recommendations Defer treatment plan to f/u with SLP     Prognosis 12/15/2016  Prognosis for Safe Diet Advancement Fair  Barriers to Reach Goals Other (Comment)  Barriers/Prognosis Comment --    CHL IP DIET RECOMMENDATION 12/15/2016  SLP Diet Recommendations Regular solids;Thin liquid  Liquid Administration via Cup;Straw  Medication Administration Whole meds with puree  Compensations Small sips/bites;Slow rate  Postural Changes Remain semi-upright after after feeds/meals (Comment);Seated upright at 90 degrees      CHL IP OTHER RECOMMENDATIONS 12/15/2016  Recommended Consults --  Oral Care Recommendations Oral care BID  Other Recommendations --      CHL IP FOLLOW UP RECOMMENDATIONS 12/15/2016  Follow up Recommendations Outpatient SLP      No flowsheet data found.         CHL IP ORAL PHASE 12/15/2016  Oral Phase Impaired  Oral - Pudding Teaspoon --  Oral - Pudding Cup --  Oral - Honey Teaspoon --  Oral - Honey Cup --  Oral - Nectar Teaspoon --  Oral - Nectar Cup --  Oral - Nectar Straw --  Oral - Thin Teaspoon --  Oral - Thin Cup Holding of bolus;Delayed oral transit;Premature spillage  Oral - Thin Straw Holding of bolus;Delayed oral transit;Premature spillage  Oral - Puree Holding of bolus;Delayed oral transit  Oral - Mech Soft --  Oral - Regular --  Oral - Multi-Consistency --  Oral - Pill --  Oral Phase - Comment --    CHL IP PHARYNGEAL PHASE 12/15/2016  Pharyngeal Phase Impaired  Pharyngeal- Pudding Teaspoon --  Pharyngeal --  Pharyngeal- Pudding Cup --  Pharyngeal --  Pharyngeal- Honey Teaspoon --  Pharyngeal --  Pharyngeal- Honey Cup --  Pharyngeal --  Pharyngeal- Nectar Teaspoon --  Pharyngeal --  Pharyngeal- Nectar Cup --  Pharyngeal --  Pharyngeal- Nectar Straw --  Pharyngeal --  Pharyngeal- Thin Teaspoon --  Pharyngeal --  Pharyngeal- Thin Cup Penetration/Aspiration before swallow  Pharyngeal Material enters airway, remains ABOVE vocal cords then  ejected out  Pharyngeal- Thin Straw Penetration/Aspiration before swallow  Pharyngeal Material enters airway, remains ABOVE vocal cords then ejected out  Pharyngeal- Puree WFL  Pharyngeal --  Pharyngeal- Mechanical Soft --  Pharyngeal --  Pharyngeal- Regular --  Pharyngeal --  Pharyngeal- Multi-consistency --  Pharyngeal --  Pharyngeal- Pill --  Pharyngeal --  Pharyngeal Comment --     CHL IP CERVICAL ESOPHAGEAL PHASE 12/15/2016  Cervical Esophageal Phase WFL  Pudding Teaspoon --  Pudding Cup --  Honey Teaspoon --  Honey Cup --  Nectar Teaspoon --  Nectar Cup --  Nectar Straw --  Thin Teaspoon --  Thin Cup --  Thin Straw --  Puree --  Mechanical Soft --  Regular --  Multi-consistency --  Pill --  Cervical Esophageal Comment --    CHL IP GO 12/15/2016  Functional Assessment Tool Used skilled clinical judgment  Functional Limitations Swallowing  Swallow Current Status [id number]) CJ  Swallow Goal Status (  [id number]) [place] Discharge Status [id number]) CJ  Motor Speech Current Status [phone]) (None)  Motor Speech Goal Status [phone]) (None)  Motor Speech Goal Status [id number]) (None)  Spoken Language Comprehension Current Status [id number]) (None)  Spoken Language Comprehension Goal Status [id number]) (None)  Spoken Language Comprehension Discharge Status [phone]) (None)  Spoken Language Expression Current Status [phone]) (None)  Spoken Language Expression Goal Status [id number]) (None)  Spoken Language Expression Discharge Status [phone]) (None)  Attention Current Status [id number]) (None)  Attention Goal Status [id number]) (None)  Attention Discharge Status [phone]) (None)  Memory Current Status [id number]) (None)  Memory Goal Status [id number]) (None)  Memory Discharge Status [id number]) (None)  Voice Current Status [id number]) (None)  Voice Goal Status [id number]) (None)  Voice Discharge Status [id number]) (None)  Other Speech-Language Pathology Functional Limitation [phone]) (None)  Other Speech-Language Pathology  Functional Limitation Goal Status [id number]) (None)  Other Speech-Language Pathology Functional Limitation Discharge Status [phone]) (None)    Germain Osgood 12/15/2016, 2:14 PM   Germain Osgood, M.A. CCC-SLP [phone]

## 2016-12-15 NOTE — Progress Notes (Signed)
  Echocardiogram 2D Echocardiogram has been performed.  Marie [name] 12/15/2016, 3:56 PM

## 2016-12-16 ENCOUNTER — Encounter (HOSPITAL_COMMUNITY): Payer: Medicare Other

## 2016-12-17 ENCOUNTER — Other Ambulatory Visit: Payer: Self-pay | Admitting: Pulmonary Disease

## 2016-12-17 DIAGNOSIS — R931 Abnormal findings on diagnostic imaging of heart and coronary circulation: Secondary | ICD-10-CM

## 2016-12-17 DIAGNOSIS — R1310 Dysphagia, unspecified: Secondary | ICD-10-CM

## 2016-12-17 NOTE — Progress Notes (Signed)
Spoke with patient and informed her results. Pt was also informed about referral to Cardiologist. Pt did not have any questions. Order was placed for cardiology referral. Nothing further needed.

## 2016-12-22 DIAGNOSIS — R062 Wheezing: Secondary | ICD-10-CM | POA: Diagnosis not present

## 2016-12-22 DIAGNOSIS — R1313 Dysphagia, pharyngeal phase: Secondary | ICD-10-CM | POA: Diagnosis not present

## 2016-12-22 DIAGNOSIS — J343 Hypertrophy of nasal turbinates: Secondary | ICD-10-CM | POA: Diagnosis not present

## 2016-12-25 ENCOUNTER — Telehealth: Payer: Self-pay | Admitting: Pulmonary Disease

## 2016-12-25 NOTE — Telephone Encounter (Signed)
Pt husband calling back to check on status of med request.Marie [name]

## 2016-12-25 NOTE — Telephone Encounter (Signed)
[phone] pt calling back still waiting

## 2016-12-25 NOTE — Telephone Encounter (Signed)
Nothing else to offer, already on max rx, if getting worse over weekend will need to go to [facility] or ER and set up with f/u asap next week

## 2016-12-25 NOTE — Telephone Encounter (Signed)
Spoke with pt, advised that we are still awaiting MD recs but that we would be calling her with recs before we leave today.  Pt expressed understanding.  Sending to DOD as BQ is off this afternoon.    MW please advise on further recs.  Thanks!   Below is 12/04/16 AVS:  Patient Instructions   For your wheezing and chest congestion: Keep taking prednisone at her current dose for now We will arrange for you to see an ear nose and throat physician for them to take a look at her vocal cords Take hypertonic saline twice a day Take mucinex twice a day Keep using albuterol on an as-needed basis Keep taking Advair as you are doing Doxycycline 100mg  po bid x 5 days   For your trouble swallowing: We will arrange for a speech therapy evaluation   For your shortness of breath: We will arrange for something called and inspiratory and expiratory pressure test Keep taking your inhaled medicines as you are doing   For your heart murmur: We will check an echocardiogram   We will see you back in a few weeks as previously scheduled

## 2016-12-25 NOTE — Telephone Encounter (Signed)
Spoke with the pt and notified of recs per MW  She verbalized understanding  OV with MW with all meds 12/28/16

## 2016-12-25 NOTE — Telephone Encounter (Signed)
Spoke with pt. States that she is not any better since her last OV with BQ. Still reports wheezing and cough. Cough is producing yellow mucus. Denies chest tightness, SOB or fever. Currently taking Prednisone 10mg  daily. She finished the Doxycycline that BQ prescribed on 12/07/16. Had an appointment with ENT on 12/22/16. Pt would like BQ's recommendations.  BQ - please advise. Thanks.

## 2016-12-25 NOTE — Telephone Encounter (Signed)
Spoke with pt's spouse, aware that we are still awaiting MD recs.    MW please advise.  Thanks.

## 2016-12-28 ENCOUNTER — Inpatient Hospital Stay (HOSPITAL_COMMUNITY)
Admission: EM | Admit: 2016-12-28 | Discharge: 2016-12-30 | DRG: 193 | Disposition: A | Discharge status: Home Health | Payer: Medicare Other | Attending: Internal Medicine | Admitting: Internal Medicine

## 2016-12-28 ENCOUNTER — Emergency Department (HOSPITAL_COMMUNITY): Payer: Medicare Other

## 2016-12-28 ENCOUNTER — Ambulatory Visit: Payer: Medicare Other | Admitting: Internal Medicine

## 2016-12-28 ENCOUNTER — Encounter (HOSPITAL_COMMUNITY): Payer: Self-pay | Admitting: Emergency Medicine

## 2016-12-28 DIAGNOSIS — I10 Essential (primary) hypertension: Secondary | ICD-10-CM | POA: Diagnosis not present

## 2016-12-28 DIAGNOSIS — Z90710 Acquired absence of both cervix and uterus: Secondary | ICD-10-CM

## 2016-12-28 DIAGNOSIS — E273 Drug-induced adrenocortical insufficiency: Secondary | ICD-10-CM | POA: Diagnosis not present

## 2016-12-28 DIAGNOSIS — J154 Pneumonia due to other streptococci: Secondary | ICD-10-CM | POA: Diagnosis not present

## 2016-12-28 DIAGNOSIS — K219 Gastro-esophageal reflux disease without esophagitis: Secondary | ICD-10-CM | POA: Diagnosis present

## 2016-12-28 DIAGNOSIS — H9190 Unspecified hearing loss, unspecified ear: Secondary | ICD-10-CM | POA: Diagnosis present

## 2016-12-28 DIAGNOSIS — Z7982 Long term (current) use of aspirin: Secondary | ICD-10-CM

## 2016-12-28 DIAGNOSIS — I482 Chronic atrial fibrillation: Secondary | ICD-10-CM | POA: Diagnosis not present

## 2016-12-28 DIAGNOSIS — J441 Chronic obstructive pulmonary disease with (acute) exacerbation: Secondary | ICD-10-CM | POA: Diagnosis not present

## 2016-12-28 DIAGNOSIS — M810 Age-related osteoporosis without current pathological fracture: Secondary | ICD-10-CM | POA: Diagnosis present

## 2016-12-28 DIAGNOSIS — Z7952 Long term (current) use of systemic steroids: Secondary | ICD-10-CM

## 2016-12-28 DIAGNOSIS — Z7951 Long term (current) use of inhaled steroids: Secondary | ICD-10-CM

## 2016-12-28 DIAGNOSIS — J181 Lobar pneumonia, unspecified organism: Secondary | ICD-10-CM

## 2016-12-28 DIAGNOSIS — G609 Hereditary and idiopathic neuropathy, unspecified: Secondary | ICD-10-CM | POA: Diagnosis not present

## 2016-12-28 DIAGNOSIS — F329 Major depressive disorder, single episode, unspecified: Secondary | ICD-10-CM | POA: Diagnosis not present

## 2016-12-28 DIAGNOSIS — Z79899 Other long term (current) drug therapy: Secondary | ICD-10-CM

## 2016-12-28 DIAGNOSIS — R9431 Abnormal electrocardiogram [ECG] [EKG]: Secondary | ICD-10-CM | POA: Diagnosis not present

## 2016-12-28 DIAGNOSIS — J440 Chronic obstructive pulmonary disease with acute lower respiratory infection: Secondary | ICD-10-CM | POA: Diagnosis not present

## 2016-12-28 DIAGNOSIS — R7309 Other abnormal glucose: Secondary | ICD-10-CM | POA: Diagnosis present

## 2016-12-28 DIAGNOSIS — J9601 Acute respiratory failure with hypoxia: Secondary | ICD-10-CM | POA: Diagnosis present

## 2016-12-28 DIAGNOSIS — I471 Supraventricular tachycardia: Secondary | ICD-10-CM | POA: Diagnosis not present

## 2016-12-28 DIAGNOSIS — E2749 Other adrenocortical insufficiency: Secondary | ICD-10-CM | POA: Diagnosis present

## 2016-12-28 DIAGNOSIS — R7303 Prediabetes: Secondary | ICD-10-CM | POA: Diagnosis not present

## 2016-12-28 DIAGNOSIS — R0682 Tachypnea, not elsewhere classified: Secondary | ICD-10-CM | POA: Diagnosis not present

## 2016-12-28 DIAGNOSIS — E785 Hyperlipidemia, unspecified: Secondary | ICD-10-CM | POA: Diagnosis not present

## 2016-12-28 DIAGNOSIS — E876 Hypokalemia: Secondary | ICD-10-CM | POA: Diagnosis present

## 2016-12-28 DIAGNOSIS — R0602 Shortness of breath: Secondary | ICD-10-CM | POA: Diagnosis not present

## 2016-12-28 LAB — COMPREHENSIVE METABOLIC PANEL
ALBUMIN: 3.7 g/dL (ref 3.5–5.0)
ALK PHOS: 54 U/L (ref 38–126)
ALT: 14 U/L (ref 14–54)
ANION GAP: 11 (ref 5–15)
AST: 26 U/L (ref 15–41)
BILIRUBIN TOTAL: 0.9 mg/dL (ref 0.3–1.2)
BUN: 14 mg/dL (ref 6–20)
CALCIUM: 8.9 mg/dL (ref 8.9–10.3)
CO2: 25 mmol/L (ref 22–32)
Chloride: 105 mmol/L (ref 101–111)
Creatinine, Ser: 1.07 mg/dL — ABNORMAL HIGH (ref 0.44–1.00)
GFR calc non Af Amer: 48 mL/min — ABNORMAL LOW (ref >60)
GFR, EST AFRICAN AMERICAN: 56 mL/min — AB (ref >60)
GLUCOSE: 129 mg/dL — AB (ref 65–99)
Potassium: 2.9 mmol/L — ABNORMAL LOW (ref 3.5–5.1)
Sodium: 141 mmol/L (ref 135–145)
TOTAL PROTEIN: 6.2 g/dL — AB (ref 6.5–8.1)

## 2016-12-28 LAB — CBC WITH DIFFERENTIAL/PLATELET
Basophils Absolute: 0.1 10*3/uL (ref 0.0–0.1)
Basophils Relative: 1 %
Eosinophils Absolute: 1.5 10*3/uL — ABNORMAL HIGH (ref 0.0–0.7)
Eosinophils Relative: 15 %
HEMATOCRIT: 47 % — AB (ref 36.0–46.0)
HEMOGLOBIN: 15.4 g/dL — AB (ref 12.0–15.0)
LYMPHS ABS: 4.4 10*3/uL — AB (ref 0.7–4.0)
LYMPHS PCT: 41 %
MCH: 29.8 pg (ref 26.0–34.0)
MCHC: 32.8 g/dL (ref 30.0–36.0)
MCV: 91.1 fL (ref 78.0–100.0)
MONOS PCT: 7 %
Monocytes Absolute: 0.7 10*3/uL (ref 0.1–1.0)
NEUTROS ABS: 3.8 10*3/uL (ref 1.7–7.7)
NEUTROS PCT: 36 %
Platelets: 240 10*3/uL (ref 150–400)
RBC: 5.16 MIL/uL — ABNORMAL HIGH (ref 3.87–5.11)
RDW: 14.1 % (ref 11.5–15.5)
WBC: 10.6 10*3/uL — ABNORMAL HIGH (ref 4.0–10.5)

## 2016-12-28 LAB — RESPIRATORY PANEL BY PCR
ADENOVIRUS-RVPPCR: NOT DETECTED
Bordetella pertussis: NOT DETECTED
CHLAMYDOPHILA PNEUMONIAE-RVPPCR: NOT DETECTED
CORONAVIRUS HKU1-RVPPCR: NOT DETECTED
CORONAVIRUS NL63-RVPPCR: NOT DETECTED
CORONAVIRUS OC43-RVPPCR: NOT DETECTED
Coronavirus 229E: NOT DETECTED
INFLUENZA A-RVPPCR: NOT DETECTED
Influenza B: NOT DETECTED
METAPNEUMOVIRUS-RVPPCR: NOT DETECTED
Mycoplasma pneumoniae: NOT DETECTED
PARAINFLUENZA VIRUS 1-RVPPCR: NOT DETECTED
PARAINFLUENZA VIRUS 2-RVPPCR: NOT DETECTED
PARAINFLUENZA VIRUS 3-RVPPCR: NOT DETECTED
PARAINFLUENZA VIRUS 4-RVPPCR: NOT DETECTED
RHINOVIRUS / ENTEROVIRUS - RVPPCR: NOT DETECTED
Respiratory Syncytial Virus: NOT DETECTED

## 2016-12-28 LAB — HIV ANTIBODY (ROUTINE TESTING W REFLEX): HIV Screen 4th Generation wRfx: NONREACTIVE

## 2016-12-28 LAB — PROCALCITONIN

## 2016-12-28 LAB — TROPONIN I: Troponin I: <0.03 ng/mL (ref <0.03)

## 2016-12-28 MED ORDER — HYDRALAZINE HCL 20 MG/ML IJ SOLN: 5.0000 mg | INTRAMUSCULAR | Status: DC | PRN

## 2016-12-28 MED ORDER — MOMETASONE FURO-FORMOTEROL FUM 200-5 MCG/ACT IN AERO
2.0000 | INHALATION_SPRAY | Freq: Two times a day (BID) | RESPIRATORY_TRACT | Status: DC
Start: 2016-12-28 — End: 2016-12-30
  Administered 2016-12-28 – 2016-12-30 (×4): 2 via RESPIRATORY_TRACT
  Filled 2016-12-28: qty 8.8

## 2016-12-28 MED ORDER — ONDANSETRON HCL 4 MG/2ML IJ SOLN: 4.0000 mg | Freq: Four times a day (QID) | INTRAMUSCULAR | Status: DC | PRN

## 2016-12-28 MED ORDER — GUAIFENESIN ER 600 MG PO TB12
1200.0000 mg | ORAL_TABLET | Freq: Two times a day (BID) | ORAL | Status: DC
Start: 2016-12-28 — End: 2016-12-30
  Administered 2016-12-28 – 2016-12-30 (×5): 1200 mg via ORAL
  Filled 2016-12-28 (×5): qty 2

## 2016-12-28 MED ORDER — ACETAMINOPHEN 325 MG PO TABS
650.0000 mg | ORAL_TABLET | Freq: Four times a day (QID) | ORAL | Status: DC | PRN
Start: 2016-12-28 — End: 2016-12-30

## 2016-12-28 MED ORDER — IPRATROPIUM-ALBUTEROL 0.5-2.5 (3) MG/3ML IN SOLN
RESPIRATORY_TRACT | Status: AC
  Filled 2016-12-28: qty 3

## 2016-12-28 MED ORDER — PANTOPRAZOLE SODIUM 40 MG PO TBEC
80.0000 mg | DELAYED_RELEASE_TABLET | Freq: Every day | ORAL | Status: DC
  Administered 2016-12-28 – 2016-12-30 (×3): 80 mg via ORAL
  Filled 2016-12-28 (×3): qty 2

## 2016-12-28 MED ORDER — HYDROCHLOROTHIAZIDE 12.5 MG PO CAPS
12.5000 mg | ORAL_CAPSULE | Freq: Every day | ORAL | Status: DC
  Administered 2016-12-28 – 2016-12-30 (×3): 12.5 mg via ORAL
  Filled 2016-12-28 (×4): qty 1

## 2016-12-28 MED ORDER — PRAVASTATIN SODIUM 20 MG PO TABS
20.0000 mg | ORAL_TABLET | Freq: Every day | ORAL | Status: DC
  Administered 2016-12-28 – 2016-12-29 (×2): 20 mg via ORAL
  Filled 2016-12-28 (×2): qty 1

## 2016-12-28 MED ORDER — BUDESONIDE 0.5 MG/2ML IN SUSP
0.5000 mg | Freq: Two times a day (BID) | RESPIRATORY_TRACT | Status: DC
  Administered 2016-12-28 – 2016-12-29 (×4): 0.5 mg via RESPIRATORY_TRACT
  Filled 2016-12-28 (×5): qty 2

## 2016-12-28 MED ORDER — ALBUTEROL SULFATE (2.5 MG/3ML) 0.083% IN NEBU: 2.5000 mg | INHALATION_SOLUTION | RESPIRATORY_TRACT | Status: DC | PRN

## 2016-12-28 MED ORDER — DEXTROSE 5 % IV SOLN
1.0000 g | INTRAVENOUS | Status: DC
  Administered 2016-12-28 – 2016-12-30 (×3): 1 g via INTRAVENOUS
  Filled 2016-12-28 (×3): qty 10

## 2016-12-28 MED ORDER — SODIUM CHLORIDE 0.9 % IV SOLN
INTRAVENOUS | Status: DC
  Administered 2016-12-28: 1000 mL via INTRAVENOUS

## 2016-12-28 MED ORDER — MAGNESIUM SULFATE 2 GM/50ML IV SOLN
2.0000 g | Freq: Once | INTRAVENOUS | Status: AC
  Administered 2016-12-28: 2 g via INTRAVENOUS
  Filled 2016-12-28: qty 50

## 2016-12-28 MED ORDER — DEXTROSE 5 % IV SOLN
500.0000 mg | INTRAVENOUS | Status: DC
  Administered 2016-12-28 – 2016-12-30 (×3): 500 mg via INTRAVENOUS
  Filled 2016-12-28 (×3): qty 500

## 2016-12-28 MED ORDER — ASPIRIN EC 81 MG PO TBEC
81.0000 mg | DELAYED_RELEASE_TABLET | Freq: Every day | ORAL | Status: DC
  Administered 2016-12-28 – 2016-12-30 (×3): 81 mg via ORAL
  Filled 2016-12-28 (×3): qty 1

## 2016-12-28 MED ORDER — LOSARTAN POTASSIUM 50 MG PO TABS
50.0000 mg | ORAL_TABLET | Freq: Every day | ORAL | Status: DC
  Administered 2016-12-28 – 2016-12-30 (×3): 50 mg via ORAL
  Filled 2016-12-28 (×3): qty 1

## 2016-12-28 MED ORDER — POTASSIUM CHLORIDE CRYS ER 20 MEQ PO TBCR
40.0000 meq | EXTENDED_RELEASE_TABLET | Freq: Once | ORAL | Status: AC
  Administered 2016-12-28: 40 meq via ORAL
  Filled 2016-12-28: qty 2

## 2016-12-28 MED ORDER — IPRATROPIUM BROMIDE 0.02 % IN SOLN
0.5000 mg | Freq: Once | RESPIRATORY_TRACT | Status: AC
  Administered 2016-12-28: 0.5 mg via RESPIRATORY_TRACT

## 2016-12-28 MED ORDER — POTASSIUM CHLORIDE CRYS ER 20 MEQ PO TBCR
40.0000 meq | EXTENDED_RELEASE_TABLET | Freq: Every day | ORAL | Status: DC
  Administered 2016-12-28 – 2016-12-29 (×2): 40 meq via ORAL
  Filled 2016-12-28 (×2): qty 2

## 2016-12-28 MED ORDER — ALBUTEROL (5 MG/ML) CONTINUOUS INHALATION SOLN
INHALATION_SOLUTION | RESPIRATORY_TRACT | Status: AC
  Administered 2016-12-28: 10 mg/h via RESPIRATORY_TRACT
  Filled 2016-12-28: qty 20

## 2016-12-28 MED ORDER — ENOXAPARIN SODIUM 40 MG/0.4ML ~~LOC~~ SOLN
40.0000 mg | SUBCUTANEOUS | Status: DC
  Administered 2016-12-28 – 2016-12-30 (×3): 40 mg via SUBCUTANEOUS
  Filled 2016-12-28 (×3): qty 0.4

## 2016-12-28 MED ORDER — ONDANSETRON HCL 4 MG PO TABS: 4.0000 mg | ORAL_TABLET | Freq: Four times a day (QID) | ORAL | Status: DC | PRN

## 2016-12-28 MED ORDER — ACETAMINOPHEN 650 MG RE SUPP: 650.0000 mg | Freq: Four times a day (QID) | RECTAL | Status: DC | PRN

## 2016-12-28 MED ORDER — IPRATROPIUM BROMIDE 0.02 % IN SOLN
RESPIRATORY_TRACT | Status: AC
  Administered 2016-12-28: 0.5 mg via RESPIRATORY_TRACT
  Filled 2016-12-28: qty 2.5

## 2016-12-28 MED ORDER — ALBUTEROL (5 MG/ML) CONTINUOUS INHALATION SOLN
10.0000 mg/h | INHALATION_SOLUTION | RESPIRATORY_TRACT | Status: DC
  Administered 2016-12-28: 10 mg/h via RESPIRATORY_TRACT

## 2016-12-28 MED ORDER — METHYLPREDNISOLONE SODIUM SUCC 125 MG IJ SOLR
60.0000 mg | Freq: Two times a day (BID) | INTRAMUSCULAR | Status: DC
  Administered 2016-12-28 (×2): 60 mg via INTRAVENOUS
  Filled 2016-12-28 (×3): qty 2

## 2016-12-28 MED ORDER — LOSARTAN POTASSIUM-HCTZ 100-25 MG PO TABS: 0.5000 | ORAL_TABLET | Freq: Every day | ORAL | Status: DC

## 2016-12-28 MED ORDER — IPRATROPIUM-ALBUTEROL 0.5-2.5 (3) MG/3ML IN SOLN
3.0000 mL | RESPIRATORY_TRACT | Status: DC
  Administered 2016-12-28 (×5): 3 mL via RESPIRATORY_TRACT
  Filled 2016-12-28 (×4): qty 3

## 2016-12-28 NOTE — Progress Notes (Addendum)
Noted respiratory panel by PCR ordered and notified Dr. Marily Memos if we can put pt on droplet isolation.  Awaiting return call.  Karie Kirks, Therapist, sports.

## 2016-12-28 NOTE — Progress Notes (Signed)
Return call received from [facility] admission nurse instructing nurse Dr. Marily Memos will be doing orders and MD notified.  Will continue to monitor.  Karie Kirks, Therapist, sports.

## 2016-12-28 NOTE — ED Provider Notes (Signed)
[facility] DEPT Provider Note   CSN: [id number] Arrival date & time: 12/28/16  5809     History   Chief Complaint Chief Complaint  Patient presents with  . Shortness of Breath    HPI Marie [name] is a 81 y.o. female.  Patient with a history of COPD, GERD, HYN, HLD presents with significant SOB that started tonight at home. No fever or cough. No chest pain, nausea, vomiting or abdominal pain. She is followed by Dr. Lake Bells for COPD and has been using her nebulizer at home without relief.    The history is provided by the patient. No language interpreter was used.  Shortness of Breath  Pertinent negatives include no fever, no sore throat, no cough, no chest pain, no abdominal pain, no rash and no leg swelling.    Past Medical History:  Diagnosis Date  . Balance disorder 2008.     Falls a lot.  She can be standing and then leans too far over to one side   . Bulging disc   . Chronic bronchitis ([place])    due to congestion at times, on prednisone and advair  . COPD (chronic obstructive pulmonary disease) ([place])   . Depression    many years ago  . GERD (gastroesophageal reflux disease)   . Hearing loss    mild  . Hyperlipidemia   . Hypertension   . Iatrogenic adrenal insufficiency ([place])   . Incontinence    not indicated at this visit.  Marland Kitchen Neuropathy (HCC)    legs stay numb   . Osteoarthritis    hands,   . Osteoporosis   . Shortness of breath dyspnea   . Tubulovillous adenoma of rectum   . Wears dentures   . Wears glasses     Patient Active Problem List   Diagnosis Date Noted  . Wheezing 11/19/2016  . Depression, major, in remission ([place]) 09/22/2016  . At high risk for falls 10/16/2015  . Hypotension   . Asthma 11/12/2014  . Prediabetes 08/23/2014  . Vitamin D deficiency 08/23/2014  . Medication management 08/23/2014  . COPD 01/30/2014  . Unstable Gait 01/30/2014  . Incontinence   . Osteoarthritis   . Hyperlipidemia   . GERD (gastroesophageal reflux  disease)   . Hereditary and idiopathic peripheral neuropathy   . Iatrogenic adrenal insufficiency ([place])   . Tubulovillous adenoma of rectum 09/04/2011    Past Surgical History:  Procedure Laterality Date  . ABDOMINAL HYSTERECTOMY    . APPENDECTOMY    . CHONDROPLASTY  09/19/2014   Procedure: CHONDROPLASTY;  Surgeon: Alta Corning, MD;  Location: [facility];  Service: Orthopedics;;  . DILATION AND CURETTAGE OF UTERUS    . EXTERNAL FIXATION LEG  10/25/2012   Procedure: EXTERNAL FIXATION LEG;  Surgeon: Rozanna Box, MD;  Location: [facility];  Service: Orthopedics;  Laterality: Right;  . EYE SURGERY     bilateral cataract surgery and lens implant  . FINGER SURGERY     fusions and debridements for OA  . INCONTINENCE SURGERY     multiple procedures, not cured  . KNEE ARTHROSCOPY WITH LATERAL MENISECTOMY Right 09/19/2014   Procedure: KNEE ARTHROSCOPY WITH LATERAL MENISECTOMY;  Surgeon: Alta Corning, MD;  Location: [facility];  Service: Orthopedics;  Laterality: Right;  . KNEE ARTHROSCOPY WITH MEDIAL MENISECTOMY Right 09/19/2014   Procedure: RIGHT KNEE ARTHROSCOPY WITH MEDIAL AND LATERAL MENISECTOMIES. CHONDROPLASTY OF PATELLA-FEMORAL JOINT;  Surgeon: Alta Corning, MD;  Location: [facility];  Service: Orthopedics;  Laterality: Right;  . RECTAL BIOPSY  09/21/2011   Procedure: BIOPSY RECTAL;  Surgeon: Merrie Roof, MD;  Location: [facility];  Service: General;  Laterality: N/A;  3-4 cm  . RECTAL SURGERY     by dr. Marlou Starks, removal of polyp  . TONSILLECTOMY      OB History    No data available       Home Medications    Prior to Admission medications   Medication Sig Start Date End Date Taking? Authorizing Provider  ADVAIR DISKUS 250-50 MCG/DOSE AEPB INHALE ONE PUFF BY MOUTH TWICE DAILY 10/08/16   [name], MD  albuterol (PROVENTIL) (2.5 MG/3ML) 0.083% nebulizer solution USE ONE VIAL IN NEBULIZER EVERY 6 HOURS AS NEEDED FOR WHEEZING OR SHORTNESS  OF BREATH 11/12/16   Vicie Mutters, PA-C  aspirin 81 MG tablet Take 81 mg by mouth daily. Buys OTC    Historical Provider, MD  budesonide (PULMICORT) 0.5 MG/2ML nebulizer solution Take 2 mLs (0.5 mg total) by nebulization 2 (two) times daily. 11/10/16   [name], MD  cholecalciferol (VITAMIN D) 1000 UNITS tablet Take 5 tablets (5,000 Units total) by mouth daily. 10/28/12   Radene Gunning, NP  furosemide (LASIX) 40 MG tablet TAKE 1 TABLET EVERY DAY 08/11/16   Vicie Mutters, PA-C  montelukast (SINGULAIR) 10 MG tablet Take 1 tablet (10 mg total) by mouth daily. 11/27/16 11/27/17  Melvenia Needles, NP  Multiple Vitamin (MULTIVITAMIN) capsule Take 1 capsule by mouth daily.     Historical Provider, MD  nystatin (MYCOSTATIN) 100000 UNIT/ML suspension Take 5 mLs (500,000 Units total) by mouth 4 (four) times daily. 12/10/16   Courtney Forcucci, PA-C  omeprazole (PRILOSEC) 40 MG capsule TAKE 1 CAPSULE TWICE DAILY  FOR  ACID  REFLUX 06/11/15   Vicie Mutters, PA-C  oxybutynin (DITROPAN) 5 MG tablet TAKE ONE TABLET BY MOUTH ONCE DAILY 08/31/16   Vicie Mutters, PA-C  potassium chloride SA (K-DUR,KLOR-CON) 20 MEQ tablet Take 1 tablet (20 mEq total) by mouth 2 (two) times daily. 07/14/16   Vicie Mutters, PA-C  pravastatin (PRAVACHOL) 40 MG tablet TAKE 1 TABLET EVERY DAY FOR CHOLESTEROL 10/13/16   [name], MD  predniSONE (DELTASONE) 20 MG tablet Take 1 tablet daily with breakfast 11/16/16   Starlyn Skeans, PA-C  sodium chloride HYPERTONIC 3 % nebulizer solution Take 1 vial by nebulization BID 12/08/16   Juanito Doom, MD  traZODone (DESYREL) 50 MG tablet 1/2-1 tablet for sleep 11/01/15   Vicie Mutters, PA-C    Family History Family History  Problem Relation Age of Onset  . Cancer Sister     pt unaware of what kind  . High blood pressure Mother   . COPD Mother   . Heart attack Father     Social History Social History  Substance Use Topics  . Smoking status: Never Smoker  . Smokeless  tobacco: Never Used  . Alcohol use No     Allergies   Ertapenem; Codeine; Demerol; Morphine and related; Other; and Sulfate   Review of Systems Review of Systems  Constitutional: Negative for chills and fever.  HENT: Negative.  Negative for congestion and sore throat.   Respiratory: Positive for shortness of breath. Negative for cough.   Cardiovascular: Negative.  Negative for chest pain and leg swelling.  Gastrointestinal: Negative.  Negative for abdominal pain and nausea.  Musculoskeletal: Negative.  Negative for myalgias.  Skin: Negative.  Negative for rash.  Neurological: Negative.  Physical Exam Updated Vital Signs BP 160/71 (BP Location: Right Arm)   Pulse 104   Temp 97.9 F (36.6 C) (Oral)   Resp 25   Ht 5\' 4"  (1.626 m)   Wt 68 kg   SpO2 95%   BMI 25.75 kg/m   Physical Exam  Constitutional: She is oriented to person, place, and time. She appears well-developed and well-nourished.  HENT:  Head: Normocephalic.  Neck: Normal range of motion. Neck supple.  Cardiovascular: Normal rate and regular rhythm.   Pulmonary/Chest:  Patient has significant inspiratory and expiratory wheezing with prolonged expirations despite multiple nebulizer treatments by EMS prior to arrival. Unable to speak in full sentences.   Abdominal: Soft. Bowel sounds are normal. There is no tenderness. There is no rebound and no guarding.  Musculoskeletal: Normal range of motion. She exhibits no edema.  Neurological: She is alert and oriented to person, place, and time.  Skin: Skin is warm and dry. No rash noted.  Psychiatric: She has a normal mood and affect.     ED Treatments / Results  Labs (all labs ordered are listed, but only abnormal results are displayed) Labs Reviewed  CBC WITH DIFFERENTIAL/PLATELET  COMPREHENSIVE METABOLIC PANEL    EKG  EKG Interpretation None       Radiology No results found.  Procedures Procedures (including critical care time)  Medications  Ordered in ED Medications  albuterol (PROVENTIL, VENTOLIN) (5 MG/ML) 0.5% continuous inhalation solution (not administered)  albuterol (PROVENTIL,VENTOLIN) solution continuous neb (not administered)  ipratropium (ATROVENT) nebulizer solution 0.5 mg (not administered)     Initial Impression / Assessment and Plan / ED Course  I have reviewed the triage vital signs and the nursing notes.  Pertinent labs & imaging results that were available during my care of the patient were reviewed by me and considered in my medical decision making (see chart for details).     Patient with a history of COPD presents with wheezing and SOB c/w COPD exacerbation. She received 3 nebulizer treatments with EMS consisting of Albuterol only, and has no perceived improvement. CAT with albuterol/atrovent ordered.   IV Solumedrol given in route. Will observe during one hour treatment, consider Bipap may be necessary. Anticipate admission.  No significant improvement in wheezing after CAT. Discussed with Dr. Alcario Drought of [facility] who accepts the patient for admission.  Final Clinical Impressions(s) / ED Diagnoses   Final diagnoses:  None   1. COPD exacerbation  New Prescriptions New Prescriptions   No medications on file     Charlann Lange, Hershal Coria 12/28/16 [place], MD 12/28/16 [phone]

## 2016-12-28 NOTE — ED Triage Notes (Signed)
Ems pt from home called out for shortness of breath x 6 hours. Initial o2 sat 85% pt was give 125mg  Solumedrol and albuterol neb x 3

## 2016-12-28 NOTE — ED Provider Notes (Signed)
Patient presented to the ER with shortness of breath. Family reports that she has been having increased difficulty with her breathing for approximately 5 weeks. Over night her breathing worsened. She is not expressing any chest pain. She is brought to the ER by EMS having received multiple albuterol treatments and Solu-Medrol.  Face to face Exam: HEENT - PERRLA Lungs - adequate air movement with diffuse wheezing Heart - tachycardia no M/R/G Abd - S/NT/ND Neuro - alert, oriented x3  Plan: Patient with significant COPD exacerbation. She was initially very hypoxic at home, improved with continuous albuterol treatment here in the ER. Will require hospitalization for further management.   Orpah Greek, MD 12/28/16 [phone]

## 2016-12-28 NOTE — Progress Notes (Signed)
Respiratory panel collected for PCR sent to lab.  Pt tolerated well.  Karie Kirks, RN

## 2016-12-28 NOTE — H&P (Addendum)
History and Physical    Kieu Quiggle Sprecher YQI:[id number] DOB: [birth year] DOA: 12/28/2016  PCP: Alesia Richards, MD Patient coming from: Home  Chief Complaint: SOB, cough  HPI: Marie [name] is a 81 y.o. female with medical history significant of COPD, depression, GERD, hyperlipidemia, hypertension, adrenal insufficiency, peripheral neuropathy with a several week history of progressive cough, shortness of breath and wheezing. Associated with occasional phlegm production. Acute worsening over the last day or so prompting her husband call EMS for evaluation. Minimal improvement with home inhalers. Denies any fevers, chest pain, palpitations, nausea, vomiting, neck stiffness, headache, vertigo, dizziness, LOC, dysuria, frequency, back pain. Upon arrival EMS found patient to 2 saturations of 85%. Administered multiple rounds of nebulizer treatments in route. No home O2. Denies lower extremity edema. Of note pt started on Doxy and prednisone a few days prior to acute worsening of sx.     ED Course: Solu Medrol and breathing treatments administered by EMS in route. CAT and potassium given by EDP. Improving subjectively.    Review of Systems: As per HPI otherwise 10 point review of systems negative.   Ambulatory Status: No restrictions  Past Medical History:  Diagnosis Date  . Balance disorder 2008.     Falls a lot.  She can be standing and then leans too far over to one side   . Bulging disc   . Chronic bronchitis ([place])    due to congestion at times, on prednisone and advair  . COPD (chronic obstructive pulmonary disease) ([place])   . Depression    many years ago  . GERD (gastroesophageal reflux disease)   . Hearing loss    mild  . Hyperlipidemia   . Hypertension   . Iatrogenic adrenal insufficiency ([place])   . Incontinence    not indicated at this visit.  Marland Kitchen Neuropathy (HCC)    legs stay numb   . Osteoarthritis    hands,   . Osteoporosis   . Shortness of breath dyspnea   .  Tubulovillous adenoma of rectum   . Wears dentures   . Wears glasses     Past Surgical History:  Procedure Laterality Date  . ABDOMINAL HYSTERECTOMY    . APPENDECTOMY    . CHONDROPLASTY  09/19/2014   Procedure: CHONDROPLASTY;  Surgeon: Alta Corning, MD;  Location: [facility];  Service: Orthopedics;;  . DILATION AND CURETTAGE OF UTERUS    . EXTERNAL FIXATION LEG  10/25/2012   Procedure: EXTERNAL FIXATION LEG;  Surgeon: Rozanna Box, MD;  Location: [facility];  Service: Orthopedics;  Laterality: Right;  . EYE SURGERY     bilateral cataract surgery and lens implant  . FINGER SURGERY     fusions and debridements for OA  . INCONTINENCE SURGERY     multiple procedures, not cured  . KNEE ARTHROSCOPY WITH LATERAL MENISECTOMY Right 09/19/2014   Procedure: KNEE ARTHROSCOPY WITH LATERAL MENISECTOMY;  Surgeon: Alta Corning, MD;  Location: [facility];  Service: Orthopedics;  Laterality: Right;  . KNEE ARTHROSCOPY WITH MEDIAL MENISECTOMY Right 09/19/2014   Procedure: RIGHT KNEE ARTHROSCOPY WITH MEDIAL AND LATERAL MENISECTOMIES. CHONDROPLASTY OF PATELLA-FEMORAL JOINT;  Surgeon: Alta Corning, MD;  Location: [facility];  Service: Orthopedics;  Laterality: Right;  . RECTAL BIOPSY  09/21/2011   Procedure: BIOPSY RECTAL;  Surgeon: Merrie Roof, MD;  Location: [facility];  Service: General;  Laterality: N/A;  3-4 cm  . RECTAL SURGERY     by dr. Marlou Starks,  removal of polyp  . TONSILLECTOMY      Social History   Social History  . Marital status: Married    Spouse name: N/A  . Number of children: N/A  . Years of education: N/A   Occupational History  . Not on file.   Social History Main Topics  . Smoking status: Never Smoker  . Smokeless tobacco: Never Used  . Alcohol use No  . Drug use: No  . Sexual activity: Not on file   Other Topics Concern  . Not on file   Social History Narrative   Lives in a house that is mostly single story.  She uses a cane and a  walker which she uses for ambulation.  Drives.      Allergies  Allergen Reactions  . Ertapenem Hives    Caused whole body to turn red Other reaction(s): Redness Caused whole body to turn red  . Codeine Other (See Comments)    hallucinations  . Demerol Other (See Comments)    hallucinations  . Morphine And Related Other (See Comments)    hallucinations  . Other Other (See Comments)    Invan 7- pt became red all over  . Sulfate Other (See Comments)    unknown    Family History  Problem Relation Age of Onset  . Cancer Sister     pt unaware of what kind  . High blood pressure Mother   . COPD Mother   . Heart attack Father     Prior to Admission medications   Medication Sig Start Date End Date Taking? Authorizing Provider  ADVAIR DISKUS 250-50 MCG/DOSE AEPB INHALE ONE PUFF BY MOUTH TWICE DAILY 10/08/16  Yes [name], MD  albuterol (PROVENTIL) (2.5 MG/3ML) 0.083% nebulizer solution USE ONE VIAL IN NEBULIZER EVERY 6 HOURS AS NEEDED FOR WHEEZING OR SHORTNESS OF BREATH 11/12/16  Yes Vicie Mutters, PA-C  aspirin 81 MG tablet Take 81 mg by mouth daily. Buys OTC   Yes Historical Provider, MD  budesonide (PULMICORT) 0.5 MG/2ML nebulizer solution Take 2 mLs (0.5 mg total) by nebulization 2 (two) times daily. 11/10/16  Yes [name], MD  Cholecalciferol (VITAMIN D) 2000 units CAPS Take 2,000 Units by mouth daily.   Yes Historical Provider, MD  losartan-hydrochlorothiazide (HYZAAR) 100-25 MG tablet Take 0.5 tablets by mouth daily.   Yes Historical Provider, MD  omeprazole (PRILOSEC) 40 MG capsule TAKE 1 CAPSULE TWICE DAILY  FOR  ACID  REFLUX 06/11/15  Yes Vicie Mutters, PA-C  pravastatin (PRAVACHOL) 40 MG tablet Take 20 mg by mouth daily.   Yes Historical Provider, MD  predniSONE (DELTASONE) 10 MG tablet Take 5 mg by mouth daily with breakfast.   Yes Historical Provider, MD  sodium chloride HYPERTONIC 3 % nebulizer solution Take 1 vial by nebulization BID 12/08/16  Yes Juanito Doom, MD  cholecalciferol (VITAMIN D) 1000 UNITS tablet Take 5 tablets (5,000 Units total) by mouth daily. Patient not taking: Reported on 12/28/2016 10/28/12   Radene Gunning, NP  furosemide (LASIX) 40 MG tablet TAKE 1 TABLET EVERY DAY Patient not taking: Reported on 12/28/2016 08/11/16   Vicie Mutters, PA-C  montelukast (SINGULAIR) 10 MG tablet Take 1 tablet (10 mg total) by mouth daily. Patient not taking: Reported on 12/28/2016 11/27/16 11/27/17  Fonnie Mu Parrett, NP  nystatin (MYCOSTATIN) 100000 UNIT/ML suspension Take 5 mLs (500,000 Units total) by mouth 4 (four) times daily. Patient not taking: Reported on 12/28/2016 12/10/16   Starlyn Skeans, PA-C  oxybutynin (  DITROPAN) 5 MG tablet TAKE ONE TABLET BY MOUTH ONCE DAILY Patient not taking: Reported on 12/28/2016 08/31/16   Vicie Mutters, PA-C  potassium chloride SA (K-DUR,KLOR-CON) 20 MEQ tablet Take 1 tablet (20 mEq total) by mouth 2 (two) times daily. Patient not taking: Reported on 12/28/2016 07/14/16   Vicie Mutters, PA-C  pravastatin (PRAVACHOL) 40 MG tablet TAKE 1 TABLET EVERY DAY FOR CHOLESTEROL Patient not taking: Reported on 12/28/2016 10/13/16   [name], MD  predniSONE (DELTASONE) 20 MG tablet Take 1 tablet daily with breakfast Patient not taking: Reported on 12/28/2016 11/16/16   Starlyn Skeans, PA-C  traZODone (DESYREL) 50 MG tablet 1/2-1 tablet for sleep Patient not taking: Reported on 12/28/2016 11/01/15   Vicie Mutters, PA-C    Physical Exam: Vitals:   12/28/16 0600 12/28/16 0630 12/28/16 0715 12/28/16 0750  BP: 145/56 (!) 166/51 (!) 166/54 117/65  Pulse: 101 96 95 97  Resp: 17 24 23  (!) 22  Temp:    98.7 F (37.1 C)  TempSrc:    Oral  SpO2: 97% 95% (!) 89% (!) 88%  Weight:    66.6 kg (146 lb 12.8 oz)  Height:    5\' 4"  (1.626 m)     General:  Appears mildly anxious, sitting in bed. Eyes:  PERRL, EOMI, normal lids, iris ENT:  grossly normal hearing, lips & tongue, mmm Neck:  no LAD, masses or  thyromegaly Cardiovascular:  RRR, 4/6 systolic murmur. No LE edema.  Respiratory:  Diffuse rhonchi and wheezes with increased effort. Sitting up in bed. Abdomen:  soft, ntnd, NABS Skin:  no rash or induration seen on limited exam Musculoskeletal:  grossly normal tone BUE/BLE, good ROM, no bony abnormality Psychiatric:  grossly normal mood and affect, speech fluent and appropriate, AOx3 Neurologic:  CN 2-12 grossly intact, moves all extremities in coordinated fashion, sensation intact  Labs on Admission: I have personally reviewed following labs and imaging studies  CBC:  Recent Labs Lab 12/28/16 0539  WBC 10.6*  NEUTROABS 3.8  HGB 15.4*  HCT 47.0*  MCV 91.1  PLT 751   Basic Metabolic Panel:  Recent Labs Lab 12/28/16 0539  NA 141  K 2.9*  CL 105  CO2 25  GLUCOSE 129*  BUN 14  CREATININE 1.07*  CALCIUM 8.9   GFR: Estimated Creatinine Clearance: 40.7 mL/min (by C-G formula based on SCr of 1.07 mg/dL (H)). Liver Function Tests:  Recent Labs Lab 12/28/16 0539  AST 26  ALT 14  ALKPHOS 54  BILITOT 0.9  PROT 6.2*  ALBUMIN 3.7   No results for input(s): LIPASE, AMYLASE in the last 168 hours. No results for input(s): AMMONIA in the last 168 hours. Coagulation Profile: No results for input(s): INR, PROTIME in the last 168 hours. Cardiac Enzymes: No results for input(s): CKTOTAL, CKMB, CKMBINDEX, TROPONINI in the last 168 hours. BNP (last 3 results) No results for input(s): PROBNP in the last 8760 hours. HbA1C: No results for input(s): HGBA1C in the last 72 hours. CBG: No results for input(s): GLUCAP in the last 168 hours. Lipid Profile: No results for input(s): CHOL, HDL, LDLCALC, TRIG, CHOLHDL, LDLDIRECT in the last 72 hours. Thyroid Function Tests: No results for input(s): TSH, T4TOTAL, FREET4, T3FREE, THYROIDAB in the last 72 hours. Anemia Panel: No results for input(s): VITAMINB12, FOLATE, FERRITIN, TIBC, IRON, RETICCTPCT in the last 72 hours. Urine  analysis:    Component Value Date/Time   COLORURINE YELLOW 07/08/2016 0758   APPEARANCEUR CLOUDY (A) 07/08/2016 0758   LABSPEC 1.020  07/08/2016 0758   PHURINE 5.5 07/08/2016 0758   GLUCOSEU NEGATIVE 07/08/2016 0758   HGBUR TRACE (A) 07/08/2016 0758   BILIRUBINUR NEGATIVE 07/08/2016 0758   KETONESUR NEGATIVE 07/08/2016 0758   PROTEINUR NEGATIVE 07/08/2016 0758   UROBILINOGEN 0.2 04/16/2015 2310   NITRITE NEGATIVE 07/08/2016 0758   LEUKOCYTESUR LARGE (A) 07/08/2016 0758    Creatinine Clearance: Estimated Creatinine Clearance: 40.7 mL/min (by C-G formula based on SCr of 1.07 mg/dL (H)).  Sepsis Labs: @LABRCNTIP (procalcitonin:4,lacticidven:4) )No results found for this or any previous visit (from the past 240 hour(s)).   Radiological Exams on Admission: Dg Chest Port 1 View  Result Date: 12/28/2016 CLINICAL DATA:  Initial evaluation for acute shortness of breath. EXAM: PORTABLE CHEST 1 VIEW COMPARISON:  Prior radiograph from 11/16/2016. FINDINGS: Cardiac and mediastinal silhouettes are stable in size and contour, and remain within normal limits. Aortic atherosclerosis noted. Lungs mildly hypoinflated. Patchy left basilar opacity, which may reflect atelectasis or infiltrate. No other focal airspace disease. No pulmonary edema or pleural effusion. No pneumothorax. No acute osseus abnormality. IMPRESSION: 1. Patchy left basilar opacity, which may reflect atelectasis and/or infiltrate. 2. No other active cardiopulmonary disease. Electronically Signed   By: Jeannine Boga M.D.   On: 12/28/2016 06:05    EKG: Independently reviewed. Difficult to interpret. Multifocal atrial tachy. Significant artifact likely from respiratory difficulties.   Assessment/Plan Active Problems:   GERD (gastroesophageal reflux disease)   Acute respiratory failure (HCC)   Lobar pneumonia (HCC)   COPD exacerbation (HCC)   Nonspecific abnormal electrocardiogram (ECG) (EKG)   Hypokalemia   Essential  hypertension   Acute respiratory failure: O2 sats as low as 85% on RA and requiring respiratory support. Likely from COPD exacerbation and new L basilar CAP. Failed outpt Doxy and prednisone 20 w/ home breathing treatments.  - CTX, Azithro - Procalcitonin, Respiratory viral panel, Strep/Legionella Ag, Sputum Cx - O2 PRN - Prednisone 60 Q12 - Mucinex - Duoneb Q4 x24 hrs Q2 prn - Prior to discharge recommend ambulatory O2 for home O2 eval - continue home DUlera  Abnormal EKG: difficult to interpret but likely w/ lots of artifact and multifocal atrial tach. No h/o CAD and no CP. 4/6 systolic murmur w/ essentially nml Echo (mild grade 1 diastolic dysfunction) - repeat EKG - Trop x1  HypoK: 2.9 - Kdur - Mag  HTN: - continue losartan, HCTZ - Hydralazine prn  GERD: - continue ppi   DVT prophylaxis: L:ovenox  Code Status: full  Family Communication: husband  Disposition Plan: pending improvementi n respiratory status  Consults called: none  Admission status: inpt as likely to need multiple day stay to get respiratory status back to baseline.    Keoki Mchargue J MD [facility]  If 7PM-7AM, please contact night-coverage [email or web address] Password [facility]  12/28/2016, 8:38 AM

## 2016-12-28 NOTE — ED Notes (Signed)
Nurse drawing labs. 

## 2016-12-28 NOTE — Progress Notes (Signed)
Admitting number pager to establish primary MD for pt and admiting orders Awaiting return call.  Karie Kirks, Therapist, sports.

## 2016-12-29 DIAGNOSIS — R7303 Prediabetes: Secondary | ICD-10-CM

## 2016-12-29 LAB — CBC
HEMATOCRIT: 40.7 % (ref 36.0–46.0)
HEMOGLOBIN: 12.9 g/dL (ref 12.0–15.0)
MCH: 28.9 pg (ref 26.0–34.0)
MCHC: 31.7 g/dL (ref 30.0–36.0)
MCV: 91.3 fL (ref 78.0–100.0)
Platelets: 235 10*3/uL (ref 150–400)
RBC: 4.46 MIL/uL (ref 3.87–5.11)
RDW: 14.1 % (ref 11.5–15.5)
WBC: 14.1 10*3/uL — AB (ref 4.0–10.5)

## 2016-12-29 LAB — BASIC METABOLIC PANEL
ANION GAP: 12 (ref 5–15)
BUN: 15 mg/dL (ref 6–20)
CHLORIDE: 106 mmol/L (ref 101–111)
CO2: 24 mmol/L (ref 22–32)
CREATININE: 1.02 mg/dL — AB (ref 0.44–1.00)
Calcium: 8.9 mg/dL (ref 8.9–10.3)
GFR calc non Af Amer: 51 mL/min — ABNORMAL LOW (ref >60)
GFR, EST AFRICAN AMERICAN: 59 mL/min — AB (ref >60)
Glucose, Bld: 160 mg/dL — ABNORMAL HIGH (ref 65–99)
POTASSIUM: 3.4 mmol/L — AB (ref 3.5–5.1)
SODIUM: 142 mmol/L (ref 135–145)

## 2016-12-29 LAB — MAGNESIUM: MAGNESIUM: 2.2 mg/dL (ref 1.7–2.4)

## 2016-12-29 MED ORDER — IPRATROPIUM-ALBUTEROL 0.5-2.5 (3) MG/3ML IN SOLN
3.0000 mL | Freq: Three times a day (TID) | RESPIRATORY_TRACT | Status: DC
  Administered 2016-12-29 – 2016-12-30 (×4): 3 mL via RESPIRATORY_TRACT
  Filled 2016-12-29 (×4): qty 3

## 2016-12-29 MED ORDER — METHYLPREDNISOLONE SODIUM SUCC 40 MG IJ SOLR
40.0000 mg | Freq: Two times a day (BID) | INTRAMUSCULAR | Status: DC
  Administered 2016-12-29: 40 mg via INTRAVENOUS
  Filled 2016-12-29: qty 1

## 2016-12-29 NOTE — Care Management Note (Addendum)
Case Management Note  Patient Details  Name: Marie [name] MRN: [id number] Date of Birth: 06/03/1936  Subjective/Objective:   Admitted with Pneumonia                Action/Plan: Patient lives at home with spouse; PCP: Alesia Richards, MD; has private insurance with Medicare; pharmacy of choice is [facility], pt reports no problem getting her medication; DME - walker and cane at home; CM following for DCP; awaiting on PT eval for disposition needs;  12/30/2016-Talked to patient about [facility] choices, patient chose [facility] for [facility] and PT; Butch Penny with [facility] called for arrangements;  Expected Discharge Date:  12/29/16               Expected Discharge Plan:  [facility] provided by   In-House Referral:   [facility]  Discharge planning Services  CM Consult  Status of Service:  In process, will continue to follow  Sherrilyn Rist [phone] 12/29/2016, 1:46 PM

## 2016-12-29 NOTE — Progress Notes (Signed)
Progress Note    Marie [name]  UXL:[id number] DOB: 1936/05/25  DOA: 12/28/2016 PCP: Alesia Richards, MD    Brief Narrative:   Chief complaint: Follow-up shortness of breath  Medical records reviewed and are as summarized below:  Marie [name] is an 81 y.o. female with a PMH of COPD (however no significant airflow obstruction seen on prior lung function testing, restrictive pattern), depression, GERD, hyperlipidemia, hypertension, adrenal insufficiency, peripheral neuropathy, grade 1 diastolic dysfunction and dynamic outflow obstruction seen on echo 11/2016 with referral to cardiology, moderate oral dysphagia status post barium swallow 11/2016, felt to be at mild aspiration risk, who was admitted 12/28/16 for evaluation of a several week history of progressive cough, shortness of breath and wheezing. Of note, she was placed on doxycycline and prednisone 12/25/16 by her pulmonologist.  Assessment/Plan:   Principal Problem:   Acute respiratory failure (HCC)Secondary to lobar pneumonia and COPD exacerbation Noted to be hypoxic with room air saturations of 85% on admission, requiring respiratory support. Chest x-ray personally reviewed and shows a new left basilar infiltrate. Respiratory virus panel negative. Follow-up Legionella and strep pneumonia antigens. Continue empiric Rocephin/azithromycin. Continue Pulmicort, Mucinex, DuoNeb's, Solu-Medrol and wean steroids as tolerated (Solu-Medrol decreased from 60---> 40 mg every 12 hours on 12/28/16).    Active Problems:   Hyperlipidemia Continue Pravachol.    GERD (gastroesophageal reflux disease) Continue PPI.    Hereditary and idiopathic peripheral neuropathy    Iatrogenic adrenal insufficiency (HCC) Wean steroids slowly.    Prediabetes Hemoglobin A1c was 6% 09/2015, but was 5.4% 07/2016. Monitor sugars while on steroids.    Nonspecific abnormal electrocardiogram (ECG) (EKG) No current reports of chest pain. Artifact and  multifocal atrial tachycardia appreciated. Monitor on telemetry. Replace electrolytes. Continue aspirin.    Hypokalemia Serum potassium improved but still low. Continue supplementation. Check magnesium.    Essential hypertension Continue Cozaar and HCTZ. Hydralazine ordered as needed.   Family Communication/Anticipated D/C date and plan/Code Status   DVT prophylaxis: Lovenox ordered. Code Status: Full Code.  Family Communication: Husband at bedside. Disposition Plan: Home in the next 24-48 hours if respiratory status stable.   Medical Consultants:    None.   Procedures:    None  Anti-Infectives:    Rocephin 12/28/16--->  Azithromycin 12/28/16--->  Subjective:   Patient feels that her breathing is a little better but continues to be short winded. No complaints of chest pain. No nausea or vomiting.  Objective:    Vitals:   12/28/16 2120 12/28/16 2320 12/29/16 0153 12/29/16 0343  BP: (!) 138/56  (!) 138/50 100/72  Pulse: 97 93 95 94  Resp: 18 18 (!) 22 20  Temp: 98.3 F (36.8 C)  97.9 F (36.6 C) 98.1 F (36.7 C)  TempSrc: Oral  Oral Oral  SpO2: 96% 96% 91% 94%  Weight:    67.9 kg (149 lb 9.6 oz)  Height:        Intake/Output Summary (Last 24 hours) at 12/29/16 0847 Last data filed at 12/29/16 0300  Gross per 24 hour  Intake          1654.17 ml  Output              950 ml  Net           704.17 ml   Filed Weights   12/28/16 0530 12/28/16 0750 12/29/16 0343  Weight: 68 kg (150 lb) 66.6 kg (146 lb 12.8 oz) 67.9 kg (149 lb 9.6 oz)  Exam: General exam: Appears calm and comfortable.  Respiratory system: Course but no wheeze. Respiratory effort mildly increased. Cardiovascular system: S1 & S2 heard, RRR. No JVD,  rubs, gallops or clicks. No murmurs. Gastrointestinal system: Abdomen is nondistended, soft and nontender. No organomegaly or masses felt. Normal bowel sounds heard. Central nervous system: Alert and oriented. No focal neurological  deficits. Extremities: No clubbing,  or cyanosis. No edema. Skin: No rashes, lesions or ulcers. Psychiatry: Judgement and insight appear normal. Mood & affect appropriate.   Data Reviewed:   I have personally reviewed following labs and imaging studies:  Labs: Basic Metabolic Panel:  Recent Labs Lab 12/28/16 0539 12/29/16 0516  NA 141 142  K 2.9* 3.4*  CL 105 106  CO2 25 24  GLUCOSE 129* 160*  BUN 14 15  CREATININE 1.07* 1.02*  CALCIUM 8.9 8.9   GFR Estimated Creatinine Clearance: 43.1 mL/min (by C-G formula based on SCr of 1.02 mg/dL (H)). Liver Function Tests:  Recent Labs Lab 12/28/16 0539  AST 26  ALT 14  ALKPHOS 54  BILITOT 0.9  PROT 6.2*  ALBUMIN 3.7    CBC:  Recent Labs Lab 12/28/16 0539 12/29/16 0516  WBC 10.6* 14.1*  NEUTROABS 3.8  --   HGB 15.4* 12.9  HCT 47.0* 40.7  MCV 91.1 91.3  PLT 240 235   Cardiac Enzymes:  Recent Labs Lab 12/28/16 0844  TROPONINI <0.03   Sepsis Labs:  Recent Labs Lab 12/28/16 0539 12/28/16 0844 12/29/16 0516  PROCALCITON  --  <0.10  --   WBC 10.6*  --  14.1*    Microbiology Recent Results (from the past 240 hour(s))  Respiratory Panel by PCR     Status: None   Collection Time: 12/28/16 10:21 AM  Result Value Ref Range Status   Adenovirus NOT DETECTED NOT DETECTED Final   Coronavirus 229E NOT DETECTED NOT DETECTED Final   Coronavirus HKU1 NOT DETECTED NOT DETECTED Final   Coronavirus NL63 NOT DETECTED NOT DETECTED Final   Coronavirus OC43 NOT DETECTED NOT DETECTED Final   Metapneumovirus NOT DETECTED NOT DETECTED Final   Rhinovirus / Enterovirus NOT DETECTED NOT DETECTED Final   Influenza A NOT DETECTED NOT DETECTED Final   Influenza B NOT DETECTED NOT DETECTED Final   Parainfluenza Virus 1 NOT DETECTED NOT DETECTED Final   Parainfluenza Virus 2 NOT DETECTED NOT DETECTED Final   Parainfluenza Virus 3 NOT DETECTED NOT DETECTED Final   Parainfluenza Virus 4 NOT DETECTED NOT DETECTED Final    Respiratory Syncytial Virus NOT DETECTED NOT DETECTED Final   Bordetella pertussis NOT DETECTED NOT DETECTED Final   Chlamydophila pneumoniae NOT DETECTED NOT DETECTED Final   Mycoplasma pneumoniae NOT DETECTED NOT DETECTED Final    Radiology: Dg Chest Port 1 View  Result Date: 12/28/2016 CLINICAL DATA:  Initial evaluation for acute shortness of breath. EXAM: PORTABLE CHEST 1 VIEW COMPARISON:  Prior radiograph from 11/16/2016. FINDINGS: Cardiac and mediastinal silhouettes are stable in size and contour, and remain within normal limits. Aortic atherosclerosis noted. Lungs mildly hypoinflated. Patchy left basilar opacity, which may reflect atelectasis or infiltrate. No other focal airspace disease. No pulmonary edema or pleural effusion. No pneumothorax. No acute osseus abnormality. IMPRESSION: 1. Patchy left basilar opacity, which may reflect atelectasis and/or infiltrate. 2. No other active cardiopulmonary disease. Electronically Signed   By: Jeannine Boga M.D.   On: 12/28/2016 06:05    Medications:   . aspirin EC  81 mg Oral Daily  . azithromycin  500 mg  Intravenous Q24H  . budesonide  0.5 mg Nebulization BID  . cefTRIAXone (ROCEPHIN)  IV  1 g Intravenous Q24H  . enoxaparin (LOVENOX) injection  40 mg Subcutaneous Q24H  . guaiFENesin  1,200 mg Oral BID  . losartan  50 mg Oral Daily   And  . hydrochlorothiazide  12.5 mg Oral Daily  . ipratropium-albuterol  3 mL Nebulization TID  . methylPREDNISolone (SOLU-MEDROL) injection  60 mg Intravenous Q12H  . mometasone-formoterol  2 puff Inhalation BID  . pantoprazole  80 mg Oral Daily  . potassium chloride  40 mEq Oral Daily  . pravastatin  20 mg Oral q1800   Continuous Infusions: . sodium chloride 1,000 mL (12/28/16 0931)  . albuterol 10 mg/hr (12/28/16 0533)    Medical decision making is of high complexity and this patient is at high risk of deterioration, therefore this is a level 3 visit.  (> 4 problem points, >4 data points, Mod  risk)   LOS: 1 day   RAMA,CHRISTINA  [facility] Pager [phone]. If unable to reach me by pager, please call my cell phone at [phone].  *Please refer to [email or web address], password [email or web address] to get updated schedule on who will round on this patient, as hospitalists switch teams weekly. If 7PM-7AM, please contact night-coverage at [email or web address], password [email or web address] for any overnight needs.  12/29/2016, 8:47 AM

## 2016-12-30 ENCOUNTER — Telehealth: Payer: Self-pay | Admitting: *Deleted

## 2016-12-30 DIAGNOSIS — E785 Hyperlipidemia, unspecified: Secondary | ICD-10-CM

## 2016-12-30 DIAGNOSIS — E876 Hypokalemia: Secondary | ICD-10-CM

## 2016-12-30 DIAGNOSIS — J441 Chronic obstructive pulmonary disease with (acute) exacerbation: Secondary | ICD-10-CM

## 2016-12-30 DIAGNOSIS — J9601 Acute respiratory failure with hypoxia: Secondary | ICD-10-CM

## 2016-12-30 DIAGNOSIS — K219 Gastro-esophageal reflux disease without esophagitis: Secondary | ICD-10-CM

## 2016-12-30 DIAGNOSIS — E2749 Other adrenocortical insufficiency: Secondary | ICD-10-CM

## 2016-12-30 DIAGNOSIS — I10 Essential (primary) hypertension: Secondary | ICD-10-CM

## 2016-12-30 LAB — CBC
HCT: 44.1 % (ref 36.0–46.0)
HEMOGLOBIN: 13.6 g/dL (ref 12.0–15.0)
MCH: 28.9 pg (ref 26.0–34.0)
MCHC: 30.8 g/dL (ref 30.0–36.0)
MCV: 93.6 fL (ref 78.0–100.0)
Platelets: 235 10*3/uL (ref 150–400)
RBC: 4.71 MIL/uL (ref 3.87–5.11)
RDW: 14.5 % (ref 11.5–15.5)
WBC: 13.7 10*3/uL — ABNORMAL HIGH (ref 4.0–10.5)

## 2016-12-30 LAB — BASIC METABOLIC PANEL
Anion gap: 6 (ref 5–15)
BUN: 22 mg/dL — ABNORMAL HIGH (ref 6–20)
CALCIUM: 8.5 mg/dL — AB (ref 8.9–10.3)
CHLORIDE: 109 mmol/L (ref 101–111)
CO2: 24 mmol/L (ref 22–32)
Creatinine, Ser: 1.12 mg/dL — ABNORMAL HIGH (ref 0.44–1.00)
GFR calc non Af Amer: 46 mL/min — ABNORMAL LOW (ref >60)
GFR, EST AFRICAN AMERICAN: 53 mL/min — AB (ref >60)
Glucose, Bld: 148 mg/dL — ABNORMAL HIGH (ref 65–99)
Potassium: 4.3 mmol/L (ref 3.5–5.1)
Sodium: 139 mmol/L (ref 135–145)

## 2016-12-30 MED ORDER — BUDESONIDE 0.5 MG/2ML IN SUSP
0.5000 mg | Freq: Two times a day (BID) | RESPIRATORY_TRACT | Status: DC
  Administered 2016-12-30: 0.5 mg via RESPIRATORY_TRACT

## 2016-12-30 MED ORDER — GUAIFENESIN ER 600 MG PO TB12: 1200.0000 mg | ORAL_TABLET | Freq: Two times a day (BID) | ORAL | 0 refills | Status: DC

## 2016-12-30 MED ORDER — NYSTATIN 100000 UNIT/ML MT SUSP: 5.0000 mL | Freq: Four times a day (QID) | OROMUCOSAL | 3 refills | Status: DC

## 2016-12-30 MED ORDER — PREDNISONE 10 MG PO TABS: ORAL_TABLET | ORAL | 0 refills | Status: DC

## 2016-12-30 MED ORDER — PREDNISONE 10 MG PO TABS
60.0000 mg | ORAL_TABLET | Freq: Once | ORAL | Status: AC
  Administered 2016-12-30: 60 mg via ORAL
  Filled 2016-12-30: qty 1

## 2016-12-30 MED ORDER — NYSTATIN 100000 UNIT/ML MT SUSP
5.0000 mL | Freq: Four times a day (QID) | OROMUCOSAL | Status: DC
  Administered 2016-12-30: 500000 [IU] via ORAL
  Filled 2016-12-30: qty 5

## 2016-12-30 MED ORDER — AZITHROMYCIN 500 MG PO TABS: 500.0000 mg | ORAL_TABLET | Freq: Every day | ORAL | Status: DC

## 2016-12-30 MED ORDER — PREDNISONE 10 MG PO TABS: 10.0000 mg | ORAL_TABLET | Freq: Every day | ORAL | Status: DC

## 2016-12-30 MED ORDER — AZITHROMYCIN 500 MG PO TABS: 500.0000 mg | ORAL_TABLET | Freq: Every day | ORAL | 0 refills | Status: DC

## 2016-12-30 MED ORDER — OMEPRAZOLE 40 MG PO CPDR: 40.0000 mg | DELAYED_RELEASE_CAPSULE | Freq: Two times a day (BID) | ORAL | 4 refills | Status: DC

## 2016-12-30 NOTE — Plan of Care (Signed)
Problem: Safety: Goal: Ability to remain free from injury will improve Outcome: Completed/Met Date Met: 12/30/16 Pt. Understands need for assistance and calls for help with [facility] and ambulation.   Problem: Pain Managment: Goal: General experience of comfort will improve Outcome: Progressing Pt. Has no complaints of pain thus far and continues to remain pain free.  Problem: Activity: Goal: Risk for activity intolerance will decrease Outcome: Progressing Pt. Able to ambulate to [facility] without distress. No SOB noted.

## 2016-12-30 NOTE — Discharge Summary (Signed)
Physician Discharge Summary   Patient ID: [name] MRN: [id number] DOB/AGE: [birth year] 81 y.o.  Admit date: 12/28/2016 Discharge date: 12/30/2016  Primary Care Physician:  Marie Richards, MD  Discharge Diagnoses:    . Acute respiratory failure ([place]) . Lobar pneumonia ([place]) . COPD exacerbation ([place]) . Nonspecific abnormal electrocardiogram (ECG) (EKG) . Hypokalemia . Essential hypertension . GERD (gastroesophageal reflux disease) . Prediabetes . Hereditary and idiopathic peripheral neuropathy . Iatrogenic adrenal insufficiency ([place]) . Hyperlipidemia   Consults:  None  Recommendations for Outpatient Follow-up:  1. Home health PT, OT, home health aide, RN will be arranged by case management 2. Please repeat CBC/BMET at next visit   DIET: Heart healthy diet    Allergies:   Allergies  Allergen Reactions  . Ertapenem Hives    Caused whole body to turn red Other reaction(s): Redness Caused whole body to turn red  . Codeine Other (See Comments)    hallucinations  . Demerol Other (See Comments)    hallucinations  . Morphine And Related Other (See Comments)    hallucinations  . Other Other (See Comments)    Invan 7- pt became red all over  . Sulfate Other (See Comments)    unknown     DISCHARGE MEDICATIONS: Current Discharge Medication List    START taking these medications   Details  azithromycin (ZITHROMAX) 500 MG tablet Take 1 tablet (500 mg total) by mouth daily. X 4 days Qty: 4 tablet, Refills: 0    guaiFENesin (MUCINEX) 600 MG 12 hr tablet Take 2 tablets (1,200 mg total) by mouth 2 (two) times daily. Qty: 30 tablet, Refills: 0      CONTINUE these medications which have CHANGED   Details  nystatin (MYCOSTATIN) 100000 UNIT/ML suspension Take 5 mLs (500,000 Units total) by mouth 4 (four) times daily. Qty: 240 mL, Refills: 3    omeprazole (PRILOSEC) 40 MG capsule Take 1 capsule (40 mg total) by mouth 2 (two) times daily. Qty: 60 capsule,  Refills: 4    !! predniSONE (DELTASONE) 10 MG tablet Take 1 tablet (10 mg total) by mouth daily with breakfast. HOLD until you have completed the prednisone taper    !! predniSONE (DELTASONE) 10 MG tablet Prednisone dosing: Take  Prednisone 40mg  (4 tabs) x 3 days, then taper to 30mg  (3 tabs) x 3 days, then 20mg  (2 tabs) x 3days, then continue your maintenance dose of prednisone of 10mg  daily Qty: 27 tablet, Refills: 0     !! - Potential duplicate medications found. Please discuss with provider.    CONTINUE these medications which have NOT CHANGED   Details  ADVAIR DISKUS 250-50 MCG/DOSE AEPB INHALE ONE PUFF BY MOUTH TWICE DAILY Qty: 60 each, Refills: 1    albuterol (PROVENTIL) (2.5 MG/3ML) 0.083% nebulizer solution USE ONE VIAL IN NEBULIZER EVERY 6 HOURS AS NEEDED FOR WHEEZING OR SHORTNESS OF BREATH Qty: 75 mL, Refills: 4   Associated Diagnoses: Dyspnea; Bronchitis, chronic obstructive w acute bronchitis (HCC)    aspirin 81 MG tablet Take 81 mg by mouth daily. Buys OTC    budesonide (PULMICORT) 0.5 MG/2ML nebulizer solution Take 2 mLs (0.5 mg total) by nebulization 2 (two) times daily. Qty: 120 mL, Refills: 12    Cholecalciferol (VITAMIN D) 2000 units CAPS Take 2,000 Units by mouth daily.    losartan-hydrochlorothiazide (HYZAAR) 100-25 MG tablet Take 0.5 tablets by mouth daily.    pravastatin (PRAVACHOL) 40 MG tablet Take 20 mg by mouth daily.    sodium chloride HYPERTONIC 3 %  nebulizer solution Take 1 vial by nebulization BID Qty: 240 mL, Refills: 12      STOP taking these medications     cholecalciferol (VITAMIN D) 1000 UNITS tablet      furosemide (LASIX) 40 MG tablet      montelukast (SINGULAIR) 10 MG tablet      oxybutynin (DITROPAN) 5 MG tablet      potassium chloride SA (K-DUR,KLOR-CON) 20 MEQ tablet      traZODone (DESYREL) 50 MG tablet          Brief H and P: For complete details please refer to admission H and P, but in briefLorraine H [name] is an 81 y.o.  female with a PMH of COPD (however no significant airflow obstruction seen on prior lung function testing, restrictive pattern), depression, GERD, hyperlipidemia, hypertension, adrenal insufficiency, peripheral neuropathy, grade 1 diastolic dysfunction and dynamic outflow obstruction seen on echo 11/2016 with referral to cardiology, moderate oral dysphagia status post barium swallow 11/2016, felt to be at mild aspiration risk, who was admitted 12/28/16 for evaluation of a several week history of progressive cough, shortness of breath and wheezing. Of note, she was placed on doxycycline and prednisone 12/25/16 by her pulmonologist.  Hospital Course:     Acute respiratory failure ([place]) secondary to COPD exacerbation, lobar pneumonia - Patient was noted to be hypoxic with room air saturations of 85% on admission, requiring respiratory support. - Chest x-ray showed patchy left basilar opacity, reflecting atelectasis or infiltrate - Respiratory virus panel was negative. Pro-calcitonin less than 0.1 -  Patient was started on IV Zithromax and Rocephin, placed on Pulmicort, Mucinex, DuoNeb's, Solu-Medrol - Patient was weaned off IV Solu-Medrol and placed on prednisone with a taper until she is on maintenance dose of 10 mg prednisone daily - Follow up with pulmonology arranged on 01/12/17   Hyperlipidemia Continue Pravachol.    GERD (gastroesophageal reflux disease) Continue PPI.    Hereditary and idiopathic peripheral neuropathy    Iatrogenic adrenal insufficiency (HCC) Wean steroids slowly.    Prediabetes Hemoglobin A1c was 6% 09/2015, but was 5.4% 07/2016. Monitor sugars while on steroids.    Nonspecific abnormal electrocardiogram (ECG) (EKG) No current reports of chest pain. Artifact and multifocal atrial tachycardia appreciated. Monitor on telemetry. Replace electrolytes. Continue aspirin.    Hypokalemia Replaced, 4.3 at the time of discharge, magnesium 2.2    Essential  hypertension Continue Cozaar and HCTZ.   Day of Discharge BP (!) 133/55 (BP Location: Right Arm)   Pulse 91   Temp 97.4 F (36.3 C) (Oral)   Resp 18   Ht 5\' 4"  (1.626 m)   Wt 68.4 kg (150 lb 11.2 oz)   SpO2 94%   BMI 25.87 kg/m   Physical Exam: General: Alert and awake oriented x3 not in any acute distress. HEENT: anicteric sclera, pupils reactive to light and accommodation CVS: S1-S2 clear no murmur rubs or gallops Chest: clear to auscultation bilaterally, no wheezing rales or rhonchi Abdomen: soft nontender, nondistended, normal bowel sounds Extremities: no cyanosis, clubbing or edema noted bilaterally Neuro: Cranial nerves II-XII intact, no focal neurological deficits   The results of significant diagnostics from this hospitalization (including imaging, microbiology, ancillary and laboratory) are listed below for reference.    LAB RESULTS: Basic Metabolic Panel:  Recent Labs Lab 12/29/16 0516 12/29/16 0900 12/30/16 0441  NA 142  --  139  K 3.4*  --  4.3  CL 106  --  109  CO2 24  --  24  GLUCOSE 160*  --  148*  BUN 15  --  22*  CREATININE 1.02*  --  1.12*  CALCIUM 8.9  --  8.5*  MG  --  2.2  --    Liver Function Tests:  Recent Labs Lab 12/28/16 0539  AST 26  ALT 14  ALKPHOS 54  BILITOT 0.9  PROT 6.2*  ALBUMIN 3.7   No results for input(s): LIPASE, AMYLASE in the last 168 hours. No results for input(s): AMMONIA in the last 168 hours. CBC:  Recent Labs Lab 12/28/16 0539 12/29/16 0516 12/30/16 0858  WBC 10.6* 14.1* 13.7*  NEUTROABS 3.8  --   --   HGB 15.4* 12.9 13.6  HCT 47.0* 40.7 44.1  MCV 91.1 91.3 93.6  PLT 240 235 235   Cardiac Enzymes:  Recent Labs Lab 12/28/16 0844  TROPONINI <0.03   BNP: Invalid input(s): POCBNP CBG: No results for input(s): GLUCAP in the last 168 hours.  Significant Diagnostic Studies:  Dg Chest Port 1 View  Result Date: 12/28/2016 CLINICAL DATA:  Initial evaluation for acute shortness of breath. EXAM:  PORTABLE CHEST 1 VIEW COMPARISON:  Prior radiograph from 11/16/2016. FINDINGS: Cardiac and mediastinal silhouettes are stable in size and contour, and remain within normal limits. Aortic atherosclerosis noted. Lungs mildly hypoinflated. Patchy left basilar opacity, which may reflect atelectasis or infiltrate. No other focal airspace disease. No pulmonary edema or pleural effusion. No pneumothorax. No acute osseus abnormality. IMPRESSION: 1. Patchy left basilar opacity, which may reflect atelectasis and/or infiltrate. 2. No other active cardiopulmonary disease. Electronically Signed   By: Jeannine Boga M.D.   On: 12/28/2016 06:05    2D ECHO:   Disposition and Follow-up: Discharge Instructions    Diet - low sodium heart healthy    Complete by:  As directed    Increase activity slowly    Complete by:  As directed        DISPOSITION: home    [facility] Follow up.   Why:  They will do your home health care at your home Contact information: [place] [address] [phone]        Pixie Casino, MD Follow up on 01/07/2017.   Specialty:  Cardiology Why:  at 11:30AM  Contact information: [place] [address] [phone]        Simonne Maffucci, MD Follow up on 01/12/2017.   Specialty:  Pulmonary Disease Why:  at 2:00PM  Contact information: [place] [address] [phone]            Time spent on Discharge: 50mins   Signed:   Russ Looper M.D. [facility] 12/30/2016, 12:20 PM Pager: [phone]

## 2016-12-30 NOTE — Progress Notes (Signed)
Pt has orders to be discharged. Discharge instructions given and pt has no additional questions at this time. Medication regimen reviewed and pt educated. Pt verbalized understanding and has no additional questions. Telemetry box removed. IV removed and site in good condition. Pt stable and waiting for transportation.  Claudie Rathbone RN 

## 2016-12-30 NOTE — Telephone Encounter (Signed)
Called to schedule the pt for a hosp fu on 3/20 with Dr Crissie Sickles

## 2016-12-30 NOTE — Evaluation (Signed)
Physical Therapy Evaluation Patient Details Name: Marie [name] MRN: [id number] DOB: 10/27/1935 Today's Date: 12/30/2016   History of Present Illness  Pt is a 81 y/o female admitted secondary to SOB with cough, found to have lobar pneumonia. PMH including but not limited to COPD, HTN, HLD and neuropathy.  Clinical Impression  Pt presented sitting OOB in chair when therapist entered room. Prior to admission, pt reported that she was mod I for functional mobility with use of RW to ambulate within her home. She stated that she has a manual w/c that she uses for community mobility. During evaluation, pt requesting to ambulate without AD and instead using one person HHA, requiring min A with significant decrease in stability. PT encouraged pt to use her RW at home for safety and to reduce the risk of falls. Pt would continue to benefit from skilled physical therapy services at this time while admitted and after d/c to address her below listed limitations in order to improve her overall safety and independence with functional mobility.      Follow Up Recommendations Home health PT;Supervision/Assistance - 24 hour    Equipment Recommendations  None recommended by PT;Other (comment) (pt has all necessary DME at home)    Recommendations for Other Services       Precautions / Restrictions Restrictions Weight Bearing Restrictions: No      Mobility  Bed Mobility               General bed mobility comments: pt sitting OOB in chair with therapist entered room  Transfers Overall transfer level: Needs assistance Equipment used: Rolling walker (2 wheeled) Transfers: Sit to/from Stand Sit to Stand: Supervision         General transfer comment: increased time, supervision for safety  Ambulation/Gait Ambulation/Gait assistance: Min guard;Min assist Ambulation Distance (Feet): 100 Feet Assistive device: Rolling walker (2 wheeled);None Gait Pattern/deviations: Step-through  pattern;Decreased step length - right;Decreased step length - left;Decreased stride length;Drifts right/left;Narrow base of support Gait velocity: decreased Gait velocity interpretation: Below normal speed for age/gender General Gait Details: pt ambulated ~20' with RW and then requested to ambulated with one person HHA. pt with signficant instability without AD, requiring constant min A for balance  Stairs            Wheelchair Mobility    Modified Rankin (Stroke Patients Only)       Balance Overall balance assessment: Needs assistance Sitting-balance support: Feet supported Sitting balance-Leahy Scale: Good     Standing balance support: During functional activity;Single extremity supported Standing balance-Leahy Scale: Poor Standing balance comment: pt reliant on at least one UE support                             Pertinent Vitals/Pain Pain Assessment: No/denies pain    Home Living Family/patient expects to be discharged to:: Private residence Living Arrangements: Spouse/significant other Available Help at Discharge: Family;Available 24 hours/day Type of Home: House Home Access: Ramped entrance     Home Layout: Two level;Able to live on main level with bedroom/bathroom Home Equipment: Walker - 4 wheels;Cane - single point;Shower seat;Grab bars - toilet;Grab bars - tub/shower;Wheelchair - manual      Prior Function Level of Independence: Independent with assistive device(s)         Comments: pt reports that she ambulates with use of a rollator and has a manual w/c for longer community distances     Hand Dominance  Extremity/Trunk Assessment   Upper Extremity Assessment Upper Extremity Assessment: Overall WFL for tasks assessed    Lower Extremity Assessment Lower Extremity Assessment: Generalized weakness       Communication   Communication: No difficulties  Cognition Arousal/Alertness: Awake/alert Behavior During Therapy: WFL  for tasks assessed/performed Overall Cognitive Status: Within Functional Limits for tasks assessed                      General Comments      Exercises     Assessment/Plan    PT Assessment Patient needs continued PT services  PT Problem List Decreased strength;Decreased balance;Decreased mobility;Decreased coordination;Decreased knowledge of use of DME;Decreased safety awareness       PT Treatment Interventions DME instruction;Gait training;Functional mobility training;Stair training;Therapeutic activities;Therapeutic exercise;Balance training;Neuromuscular re-education;Patient/family education    PT Goals (Current goals can be found in the Care Plan section)  Acute Rehab PT Goals Patient Stated Goal: return home today PT Goal Formulation: With patient/family Time For Goal Achievement: 01/13/17 Potential to Achieve Goals: Good    Frequency Min 3X/week   Barriers to discharge        Co-evaluation               End of Session   Activity Tolerance: Patient tolerated treatment well Patient left: in chair;with call bell/phone within reach;with family/visitor present Nurse Communication: Mobility status PT Visit Diagnosis: Unsteadiness on feet (R26.81);Other abnormalities of gait and mobility (R26.89)         Time: 3664-4034 PT Time Calculation (min) (ACUTE ONLY): 10 min   Charges:   PT Evaluation $PT Eval Low Complexity: 1 Procedure     PT G CodesClearnce Sorrel Bernetha Anschutz 12/30/2016, 11:49 AM Sherie Don, PT, DPT [phone]

## 2016-12-30 NOTE — Consult Note (Signed)
   [facility] CM Inpatient Consult   12/30/2016  Marie [name] 1936-05-09 [id number]   Patient assessed for high risk as associated with the Medicare ACO Registry the patient.  Patient has been assigned EMMI COPD phone calls by the inpatient RNCM.  Met with the patient at the bedside to explain, [facility] Management's role in post hospital follow up.  Patient was given a brochure with contact information and encouraged to call for questions or needs.  Patient verbalized understanding.  For questions, please contact:  Natividad Brood, RN BSN [facility] Liaison  [phone] business mobile phone Toll free office [phone]

## 2016-12-31 ENCOUNTER — Encounter: Payer: Self-pay | Admitting: Internal Medicine

## 2016-12-31 DIAGNOSIS — E876 Hypokalemia: Secondary | ICD-10-CM | POA: Diagnosis not present

## 2016-12-31 DIAGNOSIS — E785 Hyperlipidemia, unspecified: Secondary | ICD-10-CM | POA: Diagnosis not present

## 2016-12-31 DIAGNOSIS — K219 Gastro-esophageal reflux disease without esophagitis: Secondary | ICD-10-CM | POA: Diagnosis not present

## 2016-12-31 DIAGNOSIS — J181 Lobar pneumonia, unspecified organism: Secondary | ICD-10-CM | POA: Diagnosis not present

## 2016-12-31 DIAGNOSIS — J440 Chronic obstructive pulmonary disease with acute lower respiratory infection: Secondary | ICD-10-CM | POA: Diagnosis not present

## 2016-12-31 DIAGNOSIS — I10 Essential (primary) hypertension: Secondary | ICD-10-CM | POA: Diagnosis not present

## 2016-12-31 DIAGNOSIS — E2740 Unspecified adrenocortical insufficiency: Secondary | ICD-10-CM | POA: Diagnosis not present

## 2016-12-31 DIAGNOSIS — J9600 Acute respiratory failure, unspecified whether with hypoxia or hypercapnia: Secondary | ICD-10-CM | POA: Diagnosis not present

## 2016-12-31 DIAGNOSIS — G609 Hereditary and idiopathic neuropathy, unspecified: Secondary | ICD-10-CM | POA: Diagnosis not present

## 2016-12-31 NOTE — Progress Notes (Signed)
[facility] Clinic Note - Initial Visit   Date: 01/01/17  Marie [name] MRN: [id number] DOB: November 22, 1935   Dear Dr. [place] Bells:  Thank you for your kind referral of Marie [name] for consultation of dysphagia. Although her history is well known to you, please allow [place] to reiterate it for the purpose of our medical record. The patient was accompanied to the clinic by husband who also provides collateral information.     History of Present Illness: Marie [name] is a 81 y.o. right-handed Caucasian female with hypertension, hyperlipidemia, COPD, depression, GERD, and neuropathy presenting for evaluation of dysphagia.    Starting in around the fall 2017, she began having difficulty swallowing, which is worse with liquids.  She had difficulty initiating swallowing and had MBS which showed markedly delayed oral transit.  Fortunately, she has not had any choking spells.  There has been 30lb unintentional weight loss. She also complains of difficulty with speech production, but husband has not noticed slurred speech.  There are no exacerbating factors.  There is no diurnal variation to there symptoms.  She does not feel that symptoms are progressing, but she is concerned that they are not improving. She denies double vision, droopy eyelids, or weakness of the arms and legs.  She is short of breath, especially when eating. She sleeps on a wedge for the past few years because of acid reflux. She complains of generalized fatigue for the past 2 years.  She has been using a walker during the same time because of imbalance.  She denies numbness/burning/tingling of the legs.  She saw Dr Jannifer Franklin in 2009 at [facility], but does not recall the reason for her visit.  For her wheezing, has been evaluated by Dr. [place] Bells whose PFTs were suggestive of restrictive pattern and therefore referred to Dr. Redmond Baseman, ENT, to evaluate for upper airway obstruction, which was not present on his exam.  She  also has echocardiogram which was notable for grade 1 diastolic dysfunction.  She has been treated for COPD and URI with several courses of antibiotics and prednisone.  Most recently she was admitted on 3/12 - 3/14 for acute respiratory failure secondary to pneumonia and COPD exacerbation.  She is currently on a prednisone taper.  She denies noticing any benefit with respect to her speech or swallow function while being on prednisone.   Out-side paper records, electronic medical record, and images have been reviewed where available and summarized as:  MBS 12/15/2016: 1. Markedly delayed oral transit. 2. Premature spill with thin liquid and flash penetration with cough. No aspiration.   Echo 12/15/2016:  Normal EF.  Grade 1 diastolic dysfunction.  No PFO.   Labs 06/2016: CK 73, TSH 1.9, HIV neg  Past Medical History:  Diagnosis Date  . Balance disorder 2008.     Falls a lot.  She can be standing and then leans too far over to one side   . Bulging disc   . Chronic bronchitis ([place])    due to congestion at times, on prednisone and advair  . COPD (chronic obstructive pulmonary disease) ([place])   . Depression    many years ago  . GERD (gastroesophageal reflux disease)   . Hearing loss    mild  . Hyperlipidemia   . Hypertension   . Iatrogenic adrenal insufficiency ([place])   . Incontinence    not indicated at this visit.  Marland Kitchen Neuropathy (HCC)    legs stay numb   . Osteoarthritis  hands,   . Osteoporosis   . Shortness of breath dyspnea   . Tubulovillous adenoma of rectum   . Wears dentures   . Wears glasses     Past Surgical History:  Procedure Laterality Date  . ABDOMINAL HYSTERECTOMY    . APPENDECTOMY    . CHONDROPLASTY  09/19/2014   Procedure: CHONDROPLASTY;  Surgeon: Alta Corning, MD;  Location: [place];  Service: Orthopedics;;  . DILATION AND CURETTAGE OF UTERUS    . EXTERNAL FIXATION LEG  10/25/2012   Procedure: EXTERNAL FIXATION LEG;  Surgeon: Rozanna Box,  MD;  Location: [place];  Service: Orthopedics;  Laterality: Right;  . EYE SURGERY     bilateral cataract surgery and lens implant  . FINGER SURGERY     fusions and debridements for OA  . INCONTINENCE SURGERY     multiple procedures, not cured  . KNEE ARTHROSCOPY WITH LATERAL MENISECTOMY Right 09/19/2014   Procedure: KNEE ARTHROSCOPY WITH LATERAL MENISECTOMY;  Surgeon: Alta Corning, MD;  Location: [place];  Service: Orthopedics;  Laterality: Right;  . KNEE ARTHROSCOPY WITH MEDIAL MENISECTOMY Right 09/19/2014   Procedure: RIGHT KNEE ARTHROSCOPY WITH MEDIAL AND LATERAL MENISECTOMIES. CHONDROPLASTY OF PATELLA-FEMORAL JOINT;  Surgeon: Alta Corning, MD;  Location: [place];  Service: Orthopedics;  Laterality: Right;  . RECTAL BIOPSY  09/21/2011   Procedure: BIOPSY RECTAL;  Surgeon: Merrie Roof, MD;  Location: [place];  Service: General;  Laterality: N/A;  3-4 cm  . RECTAL SURGERY     by dr. Marlou Starks, removal of polyp  . TONSILLECTOMY       Medications:  Outpatient Encounter Prescriptions as of 01/01/2017  Medication Sig  . ADVAIR DISKUS 250-50 MCG/DOSE AEPB INHALE ONE PUFF BY MOUTH TWICE DAILY  . albuterol (PROVENTIL) (2.5 MG/3ML) 0.083% nebulizer solution USE ONE VIAL IN NEBULIZER EVERY 6 HOURS AS NEEDED FOR WHEEZING OR SHORTNESS OF BREATH  . aspirin 81 MG tablet Take 81 mg by mouth daily. Buys OTC  . budesonide (PULMICORT) 0.5 MG/2ML nebulizer solution Take 2 mLs (0.5 mg total) by nebulization 2 (two) times daily.  . Cholecalciferol (VITAMIN D) 2000 units CAPS Take 2,000 Units by mouth daily.  Marland Kitchen guaiFENesin (MUCINEX) 600 MG 12 hr tablet Take 2 tablets (1,200 mg total) by mouth 2 (two) times daily.  Marland Kitchen losartan-hydrochlorothiazide (HYZAAR) 100-25 MG tablet Take 0.5 tablets by mouth daily.  Marland Kitchen nystatin (MYCOSTATIN) 100000 UNIT/ML suspension Take 5 mLs (500,000 Units total) by mouth 4 (four) times daily.  Marland Kitchen omeprazole (PRILOSEC) 40 MG capsule Take 1 capsule (40 mg  total) by mouth 2 (two) times daily.  . pravastatin (PRAVACHOL) 40 MG tablet Take 20 mg by mouth daily.  . sodium chloride HYPERTONIC 3 % nebulizer solution Take 1 vial by nebulization BID  . [DISCONTINUED] azithromycin (ZITHROMAX) 500 MG tablet Take 1 tablet (500 mg total) by mouth daily. X 4 days  . [DISCONTINUED] predniSONE (DELTASONE) 10 MG tablet Take 1 tablet (10 mg total) by mouth daily with breakfast. HOLD until you have completed the prednisone taper  . [DISCONTINUED] predniSONE (DELTASONE) 10 MG tablet Prednisone dosing: Take  Prednisone 40mg  (4 tabs) x 3 days, then taper to 30mg  (3 tabs) x 3 days, then 20mg  (2 tabs) x 3days, then continue your maintenance dose of prednisone of 10mg  daily   No facility-administered encounter medications on file as of 01/01/2017.      Allergies:  Allergies  Allergen Reactions  . Ertapenem Hives  Caused whole body to turn red Other reaction(s): Redness Caused whole body to turn red  . Codeine Other (See Comments)    hallucinations  . Demerol Other (See Comments)    hallucinations  . Morphine And Related Other (See Comments)    hallucinations  . Other Other (See Comments)    Invan 7- pt became red all over  . Sulfate Other (See Comments)    unknown    Family History: Family History  Problem Relation Age of Onset  . Cancer Sister     pt unaware of what kind  . High blood pressure Mother   . COPD Mother   . Heart attack Father     Social History: Social History  Substance Use Topics  . Smoking status: Never Smoker  . Smokeless tobacco: Never Used  . Alcohol use No   Social History   Social History Narrative   Lives in a house that is mostly single story.  She uses a cane and a walker which she uses for ambulation.  Drives.  Lives with husband.  Has one daughter.  Retired Building control surveyor.  Education: high school.    Review of Systems:  CONSTITUTIONAL: No fevers, chills, night sweats, +30lb weight loss.   EYES: No visual changes  or eye pain ENT: No hearing changes.  No history of nose bleeds.   RESPIRATORY: +cough, +wheezing +shortness of breath.   CARDIOVASCULAR: Negative for +pain, and palpitations.   GI: Negative for abdominal discomfort, blood in stools or black stools.  No recent change in bowel habits.   GU:  No history of incontinence.   MUSCLOSKELETAL: No history of joint pain or swelling.  No myalgias.   SKIN: Negative for lesions, rash, and itching.   HEMATOLOGY/ONCOLOGY: Negative for prolonged bleeding, bruising easily, and swollen nodes.  No history of cancer.   ENDOCRINE: Negative for cold or heat intolerance, polydipsia or goiter.   PSYCH:  No depression or anxiety symptoms.   NEURO: As Above.   Vital Signs:  BP 120/80   Pulse 79   Ht 5\' 4"  (1.626 m)   Wt 149 lb 7 oz (67.8 kg)   SpO2 98%   BMI 25.65 kg/m    General Medical Exam:   General:  Frail-appearing, comfortable.   Eyes/ENT: see cranial nerve examination.   Neck: No masses appreciated. No carotid bruits. Respiratory:  Mild bibasilar wheezing, good air entry bilaterally.   Cardiac:  Regular rate and rhythm, no murmur.   Skin:  Multiple ecchymosis over the skin  Neurological Exam: MENTAL STATUS including orientation to time, place, person, recent and remote memory, attention span and concentration, language, and fund of knowledge is normal.  Speech is is mildly dysarthric, especially with guttural sounds. Lingual sounds intact.   CRANIAL NERVES: II:  No visual field defects.    III-IV-VI: Pupils equal round and reactive to light.  Normal conjugate, extra-ocular eye movements in all directions of gaze.  No nystagmus.  No ptosis at rest or with sustained upgaze.   V:  Normal facial sensation.  Jaw jerk is absent.   VII:  Normal facial symmetry and movements.  Orbicularis oculi, orbicularis oris, and buccinator muscles are 5/5.  Myerson's sign is present.  VIII:  Normal hearing and vestibular function.   IX-X:  Normal palatal movement.    XI:  Normal shoulder shrug and head rotation.   XII:  Normal tongue strength and range of motion, no deviation or fasciculation.  MOTOR:  Generalized loss of muscle bulk,  with atrophy in the intrinsic hand muscles.  No fasciculations or abnormal movements.  No pronator drift.  Tone is normal.  Motor strength including neck flexion is 5/5.  There is no evidence of muscle fatigability.  Right Upper Extremity:    Left Upper Extremity:    Deltoid  5/5   Deltoid  5/5   Biceps  5/5   Biceps  5/5   Triceps  5/5   Triceps  5/5   Wrist extensors  5/5   Wrist extensors  5/5   Wrist flexors  5/5   Wrist flexors  5/5   Finger extensors  5/5   Finger extensors  5/5   Finger flexors  5/5   Finger flexors  5/5   Dorsal interossei  4+/5   Dorsal interossei  4+/5   Abductor pollicis  5/5   Abductor pollicis  5/5   Tone (Ashworth scale)  0  Tone (Ashworth scale)  0   Right Lower Extremity:    Left Lower Extremity:    Hip flexors  5/5   Hip flexors  5/5   Hip extensors  5/5   Hip extensors  5/5   Knee flexors  5/5   Knee flexors  5/5   Knee extensors  5/5   Knee extensors  5/5   Dorsiflexors  5/5   Dorsiflexors  5/5   Plantarflexors  5/5   Plantarflexors  5/5   Toe extensors  5/5   Toe extensors  5/5   Toe flexors  5/5   Toe flexors  5/5   Tone (Ashworth scale)  0  Tone (Ashworth scale)  0   MSRs:  Right                                                                 Left brachioradialis 3+  brachioradialis 3+  biceps 3+  biceps 3+  triceps 3+  triceps 3+  patellar 3+  patellar 3+  ankle jerk 2+  ankle jerk 2+  Hoffman no  Hoffman no  plantar response down  plantar response down  Bilateral crossed adductors and medial pectoralis muscles are 5/5  SENSORY:  Vibration absent below the knees, temperature and pin prick also reduced in the lower legs.  COORDINATION/GAIT: Normal finger-to- nose-finger.  Mild decrement with finger and heel tapping on the left as compared to the right.  Able to rise  from a chair without using arms.  Gait appears ataxic, assisted with walker.      IMPRESSION: Marie [name] is a very pleasant 81 year-old female referred for evaluation of dysarthria and dysphagia.  Her exam shows difficulty with enunciation of guttural sounds, generalized hyperreflexia, subtle bradykinesia on the left, and sensory loss in the legs.  My suspicion for myasthenia is low given that she does not have bulbar weakness on exam and there has been no improvement despite being on high dose prednisone.  To be sure, she will have acetylcholine receptor antibody testing.  With her hyperreflexia, she will have MRI brain to assess for intracranial pathology (stroke, focal atrophy).  She may have atypical presentation of neurodegenerative condition such as parkinson-plus syndrome (?PSP), but I would expect more parkinsonian features, which she does not have.  Primary lateral sclerosis would have spasticity, which is not  present.  No signs of lower motor neuron dysfunction, making motor neuron disease less likely. Doubt bulbar variant of CIDP as reflexes would be absent.  EMG will help narrow her differential.  PLAN/RECOMMENDATIONS:  1.  Check myasthenia gravis antibodies, CK, aldolase, vitamin B12 2.  MRI brain wo contrast  3.  NCS/EMG of the right side - MG protocol  4.  Consider MRI cervical spine, going forward 5.  Encouraged to drink 2-3 cans of ensure daily  Further recommendations will be based on the results of her testing  The duration of this appointment visit was 60 minutes of face-to-face time with the patient.  Greater than 50% of this time was spent in counseling, explanation of diagnosis, planning of further management, and coordination of care.   Thank you for allowing me to participate in patient's care.  If I can answer any additional questions, I would be pleased to do so.    Sincerely,    Latarsha Zani K. Posey Pronto, DO

## 2017-01-01 ENCOUNTER — Other Ambulatory Visit (INDEPENDENT_AMBULATORY_CARE_PROVIDER_SITE_OTHER): Payer: Medicare Other

## 2017-01-01 ENCOUNTER — Encounter: Payer: Self-pay | Admitting: Neurology

## 2017-01-01 ENCOUNTER — Ambulatory Visit (INDEPENDENT_AMBULATORY_CARE_PROVIDER_SITE_OTHER): Payer: Medicare Other | Admitting: Neurology

## 2017-01-01 VITALS — BP 120/80 | HR 79 | Ht 64.0 in | Wt 149.4 lb

## 2017-01-01 DIAGNOSIS — R1310 Dysphagia, unspecified: Secondary | ICD-10-CM

## 2017-01-01 DIAGNOSIS — R270 Ataxia, unspecified: Secondary | ICD-10-CM | POA: Diagnosis not present

## 2017-01-01 DIAGNOSIS — R471 Dysarthria and anarthria: Secondary | ICD-10-CM | POA: Diagnosis not present

## 2017-01-01 LAB — VITAMIN B12: Vitamin B-12: 503 pg/mL (ref 211–911)

## 2017-01-01 LAB — CK: CK TOTAL: 36 U/L (ref 7–177)

## 2017-01-01 NOTE — Patient Instructions (Addendum)
1.  MRI brain wo contrast 2.  Check blood work 3.  NCS/EMG of the right side - first week of April  We will call you with the results and decide the next step

## 2017-01-02 LAB — CULTURE, BLOOD (ROUTINE X 2)
CULTURE: NO GROWTH
Culture: NO GROWTH

## 2017-01-03 DIAGNOSIS — I10 Essential (primary) hypertension: Secondary | ICD-10-CM | POA: Diagnosis not present

## 2017-01-03 DIAGNOSIS — J9600 Acute respiratory failure, unspecified whether with hypoxia or hypercapnia: Secondary | ICD-10-CM | POA: Diagnosis not present

## 2017-01-03 DIAGNOSIS — G609 Hereditary and idiopathic neuropathy, unspecified: Secondary | ICD-10-CM | POA: Diagnosis not present

## 2017-01-03 DIAGNOSIS — J440 Chronic obstructive pulmonary disease with acute lower respiratory infection: Secondary | ICD-10-CM | POA: Diagnosis not present

## 2017-01-03 DIAGNOSIS — E876 Hypokalemia: Secondary | ICD-10-CM | POA: Diagnosis not present

## 2017-01-03 DIAGNOSIS — J181 Lobar pneumonia, unspecified organism: Secondary | ICD-10-CM | POA: Diagnosis not present

## 2017-01-04 DIAGNOSIS — J440 Chronic obstructive pulmonary disease with acute lower respiratory infection: Secondary | ICD-10-CM | POA: Diagnosis not present

## 2017-01-04 DIAGNOSIS — J9600 Acute respiratory failure, unspecified whether with hypoxia or hypercapnia: Secondary | ICD-10-CM | POA: Diagnosis not present

## 2017-01-04 DIAGNOSIS — E876 Hypokalemia: Secondary | ICD-10-CM | POA: Diagnosis not present

## 2017-01-04 DIAGNOSIS — G609 Hereditary and idiopathic neuropathy, unspecified: Secondary | ICD-10-CM | POA: Diagnosis not present

## 2017-01-04 DIAGNOSIS — I10 Essential (primary) hypertension: Secondary | ICD-10-CM | POA: Diagnosis not present

## 2017-01-04 DIAGNOSIS — J181 Lobar pneumonia, unspecified organism: Secondary | ICD-10-CM | POA: Diagnosis not present

## 2017-01-04 LAB — ALDOLASE: Aldolase: 6.3 U/L (ref <=8.1)

## 2017-01-05 ENCOUNTER — Ambulatory Visit: Payer: Self-pay | Admitting: Internal Medicine

## 2017-01-05 DIAGNOSIS — E876 Hypokalemia: Secondary | ICD-10-CM | POA: Diagnosis not present

## 2017-01-05 DIAGNOSIS — J440 Chronic obstructive pulmonary disease with acute lower respiratory infection: Secondary | ICD-10-CM | POA: Diagnosis not present

## 2017-01-05 DIAGNOSIS — J181 Lobar pneumonia, unspecified organism: Secondary | ICD-10-CM | POA: Diagnosis not present

## 2017-01-05 DIAGNOSIS — I10 Essential (primary) hypertension: Secondary | ICD-10-CM | POA: Diagnosis not present

## 2017-01-05 DIAGNOSIS — G609 Hereditary and idiopathic neuropathy, unspecified: Secondary | ICD-10-CM | POA: Diagnosis not present

## 2017-01-05 DIAGNOSIS — J9600 Acute respiratory failure, unspecified whether with hypoxia or hypercapnia: Secondary | ICD-10-CM | POA: Diagnosis not present

## 2017-01-06 DIAGNOSIS — J9600 Acute respiratory failure, unspecified whether with hypoxia or hypercapnia: Secondary | ICD-10-CM | POA: Diagnosis not present

## 2017-01-06 DIAGNOSIS — J440 Chronic obstructive pulmonary disease with acute lower respiratory infection: Secondary | ICD-10-CM | POA: Diagnosis not present

## 2017-01-06 DIAGNOSIS — I10 Essential (primary) hypertension: Secondary | ICD-10-CM | POA: Diagnosis not present

## 2017-01-06 DIAGNOSIS — E876 Hypokalemia: Secondary | ICD-10-CM | POA: Diagnosis not present

## 2017-01-06 DIAGNOSIS — J181 Lobar pneumonia, unspecified organism: Secondary | ICD-10-CM | POA: Diagnosis not present

## 2017-01-06 DIAGNOSIS — G609 Hereditary and idiopathic neuropathy, unspecified: Secondary | ICD-10-CM | POA: Diagnosis not present

## 2017-01-07 ENCOUNTER — Ambulatory Visit (INDEPENDENT_AMBULATORY_CARE_PROVIDER_SITE_OTHER): Payer: Medicare Other | Admitting: Internal Medicine

## 2017-01-07 ENCOUNTER — Encounter: Payer: Self-pay | Admitting: Internal Medicine

## 2017-01-07 VITALS — BP 132/64 | HR 74 | Ht 64.0 in | Wt 147.5 lb

## 2017-01-07 DIAGNOSIS — R011 Cardiac murmur, unspecified: Secondary | ICD-10-CM

## 2017-01-07 DIAGNOSIS — I517 Cardiomegaly: Secondary | ICD-10-CM

## 2017-01-07 DIAGNOSIS — I10 Essential (primary) hypertension: Secondary | ICD-10-CM

## 2017-01-07 LAB — MYASTHENIA GRAVIS PANEL 2
Acetylcholine Rec Binding: <0.3 nmol/L
Acetylcholine Rec Mod Ab: 11 % binding inhibition

## 2017-01-07 NOTE — Progress Notes (Signed)
OFFICE NOTE  Chief Complaint:  Abnormal echo  Primary Care Physician: Alesia Richards, MD  HPI:  Marie [name] is a 81 y.o. female is a history of COPD and hospitalization for worsening shortness of breath. She was noted to have a murmur and underwent echocardiogram. This demonstrated normal systolic function, with mild to moderate LVH and a peak gradient of 7 mmHg in the mid ventricle. There was no significant LVOT obstruction or evidence for hypertrophic obstructive cardiopathy. No aortic or mitral stenosis or significant regurgitation was noted although in the past she's had at least mild mitral regurgitation. RVSP was noted to be normal at 28 mmHg. She reports her breathing has improved. She denies any chest pain with exertion. There is a family history of heart disease in her father who had an MI and died in his 75s.  PMHx:  Past Medical History:  Diagnosis Date  . Balance disorder 2008.     Falls a lot.  She can be standing and then leans too far over to one side   . Bulging disc   . Chronic bronchitis ([place])    due to congestion at times, on prednisone and advair  . COPD (chronic obstructive pulmonary disease) ([place])   . Depression    many years ago  . GERD (gastroesophageal reflux disease)   . Hearing loss    mild  . Hyperlipidemia   . Hypertension   . Iatrogenic adrenal insufficiency ([place])   . Incontinence    not indicated at this visit.  Marland Kitchen Neuropathy (HCC)    legs stay numb   . Osteoarthritis    hands,   . Osteoporosis   . Shortness of breath dyspnea   . Tubulovillous adenoma of rectum   . Wears dentures   . Wears glasses     Past Surgical History:  Procedure Laterality Date  . ABDOMINAL HYSTERECTOMY    . APPENDECTOMY    . CHONDROPLASTY  09/19/2014   Procedure: CHONDROPLASTY;  Surgeon: Alta Corning, MD;  Location: [place];  Service: Orthopedics;;  . DILATION AND CURETTAGE OF UTERUS    . EXTERNAL FIXATION LEG  10/25/2012   Procedure: EXTERNAL FIXATION LEG;  Surgeon: Rozanna Box, MD;  Location: [place];  Service: Orthopedics;  Laterality: Right;  . EYE SURGERY     bilateral cataract surgery and lens implant  . FINGER SURGERY     fusions and debridements for OA  . INCONTINENCE SURGERY     multiple procedures, not cured  . KNEE ARTHROSCOPY WITH LATERAL MENISECTOMY Right 09/19/2014   Procedure: KNEE ARTHROSCOPY WITH LATERAL MENISECTOMY;  Surgeon: Alta Corning, MD;  Location: [place];  Service: Orthopedics;  Laterality: Right;  . KNEE ARTHROSCOPY WITH MEDIAL MENISECTOMY Right 09/19/2014   Procedure: RIGHT KNEE ARTHROSCOPY WITH MEDIAL AND LATERAL MENISECTOMIES. CHONDROPLASTY OF PATELLA-FEMORAL JOINT;  Surgeon: Alta Corning, MD;  Location: [place];  Service: Orthopedics;  Laterality: Right;  . RECTAL BIOPSY  09/21/2011   Procedure: BIOPSY RECTAL;  Surgeon: Merrie Roof, MD;  Location: [place];  Service: General;  Laterality: N/A;  3-4 cm  . RECTAL SURGERY     by dr. Marlou Starks, removal of polyp  . TONSILLECTOMY      FAMHx:  Family History  Problem Relation Age of Onset  . Cancer Sister     pt unaware of what kind  . High blood pressure Mother   . COPD Mother   . Heart  attack Father     SOCHx:   reports that she has never smoked. She has never used smokeless tobacco. She reports that she does not drink alcohol or use drugs.  ALLERGIES:  Allergies  Allergen Reactions  . Ertapenem Hives    Caused whole body to turn red Other reaction(s): Redness Caused whole body to turn red  . Codeine Other (See Comments)    hallucinations  . Demerol Other (See Comments)    hallucinations  . Morphine And Related Other (See Comments)    hallucinations  . Other Other (See Comments)    Invan 7- pt became red all over  . Sulfate Other (See Comments)    unknown    ROS: Pertinent items noted in HPI and remainder of comprehensive ROS otherwise negative.  HOME MEDS: Current  Outpatient Prescriptions on File Prior to Visit  Medication Sig Dispense Refill  . ADVAIR DISKUS 250-50 MCG/DOSE AEPB INHALE ONE PUFF BY MOUTH TWICE DAILY 60 each 1  . albuterol (PROVENTIL) (2.5 MG/3ML) 0.083% nebulizer solution USE ONE VIAL IN NEBULIZER EVERY 6 HOURS AS NEEDED FOR WHEEZING OR SHORTNESS OF BREATH 75 mL 4  . aspirin 81 MG tablet Take 81 mg by mouth daily. Buys OTC    . budesonide (PULMICORT) 0.5 MG/2ML nebulizer solution Take 2 mLs (0.5 mg total) by nebulization 2 (two) times daily. 120 mL 12  . Cholecalciferol (VITAMIN D) 2000 units CAPS Take 2,000 Units by mouth daily.    Marland Kitchen guaiFENesin (MUCINEX) 600 MG 12 hr tablet Take 2 tablets (1,200 mg total) by mouth 2 (two) times daily. 30 tablet 0  . losartan-hydrochlorothiazide (HYZAAR) 100-25 MG tablet Take 0.5 tablets by mouth daily.    Marland Kitchen nystatin (MYCOSTATIN) 100000 UNIT/ML suspension Take 5 mLs (500,000 Units total) by mouth 4 (four) times daily. 240 mL 3  . omeprazole (PRILOSEC) 40 MG capsule Take 1 capsule (40 mg total) by mouth 2 (two) times daily. 60 capsule 4  . pravastatin (PRAVACHOL) 40 MG tablet Take 20 mg by mouth daily.    . sodium chloride HYPERTONIC 3 % nebulizer solution Take 1 vial by nebulization BID 240 mL 12   No current facility-administered medications on file prior to visit.     LABS/IMAGING: No results found for this or any previous visit (from the past 48 hour(s)). No results found.  WEIGHTS: Wt Readings from Last 3 Encounters:  01/07/17 147 lb 8 oz (66.9 kg)  01/01/17 149 lb 7 oz (67.8 kg)  12/30/16 150 lb 11.2 oz (68.4 kg)    VITALS: BP 132/64 (BP Location: Right Arm, Patient Position: Sitting, Cuff Size: Normal)   Pulse 74   Ht 5\' 4"  (1.626 m)   Wt 147 lb 8 oz (66.9 kg)   SpO2 98%   BMI 25.32 kg/m   EXAM: General appearance: alert and no distress Neck: no carotid bruit and no JVD Lungs: diminished breath sounds bilaterally Heart: regular rate and rhythm Abdomen: soft, non-tender; bowel  sounds normal; no masses,  no organomegaly Extremities: extremities normal, atraumatic, no cyanosis or edema Pulses: 2+ and symmetric Skin: Skin color, texture, turgor normal. No rashes or lesions Neurologic: Grossly normal Psych: Pleasant  EKG: Deferred (reviewed recent EKG from PCP on 12/29/2016, NSR, LAE and inferior Q waves)  ASSESSMENT: 1. LVH with hyperdynamic ventricle and mild mid-cavitary gradient 2. HTN 3. COPD/Chronic bronchitis  PLAN: 1.   Mrs. Dorothea Ogle had an echocardiogram which did not show any significant or concerning valvular heart disease, normal heart function although  EF was not provided the LV function was hyperdynamic with a mid cavitary gradient. This is not of consequence and not associated with hypertrophic cardiomyopathy. I think her LVH is likely related to hypertensive heart disease. She's asymptomatic with this. Her shortness of breath is at baseline currently. She denies any chest pain. She is on appropriate preventive therapy with aspirin, statin and hypertensive medications.  Follow-up with me as needed. Thanks for the consultation.  Pixie Casino, MD, [facility] Attending Cardiologist [place] C Vrishank Moster 01/07/2017, 12:52 PM

## 2017-01-07 NOTE — Patient Instructions (Signed)
Your physician recommends that you schedule a follow-up appointment as needed  

## 2017-01-08 ENCOUNTER — Ambulatory Visit: Payer: Self-pay | Admitting: Internal Medicine

## 2017-01-08 ENCOUNTER — Telehealth: Payer: Self-pay | Admitting: *Deleted

## 2017-01-08 ENCOUNTER — Other Ambulatory Visit: Payer: Self-pay | Admitting: *Deleted

## 2017-01-08 NOTE — Patient Outreach (Signed)
[facility]) Care Management  01/08/2017  Marie [name] Dec 28, 1935 [id number]  EMMI-Pneumonia referral for red dashboard -more short of breath than yesterday 01/06/2017:  Telephone call to patient; left message on voice mail requesting return call.  Plan: Will follow up.  Sherrin Daisy, RN BSN [place] Management Coordinator [facility] Care Management  [phone]

## 2017-01-08 NOTE — Telephone Encounter (Signed)
Patient notified

## 2017-01-08 NOTE — Telephone Encounter (Signed)
-----   Message from Alda Berthold, DO sent at 01/07/2017  5:07 PM EDT ----- Please notify patient lab are within normal limits. Thank you.

## 2017-01-11 ENCOUNTER — Ambulatory Visit: Payer: Self-pay | Admitting: Internal Medicine

## 2017-01-11 ENCOUNTER — Encounter: Payer: Self-pay | Admitting: *Deleted

## 2017-01-11 ENCOUNTER — Other Ambulatory Visit: Payer: Self-pay | Admitting: *Deleted

## 2017-01-11 DIAGNOSIS — G609 Hereditary and idiopathic neuropathy, unspecified: Secondary | ICD-10-CM | POA: Diagnosis not present

## 2017-01-11 DIAGNOSIS — J440 Chronic obstructive pulmonary disease with acute lower respiratory infection: Secondary | ICD-10-CM | POA: Diagnosis not present

## 2017-01-11 DIAGNOSIS — E876 Hypokalemia: Secondary | ICD-10-CM | POA: Diagnosis not present

## 2017-01-11 DIAGNOSIS — J9600 Acute respiratory failure, unspecified whether with hypoxia or hypercapnia: Secondary | ICD-10-CM | POA: Diagnosis not present

## 2017-01-11 DIAGNOSIS — J181 Lobar pneumonia, unspecified organism: Secondary | ICD-10-CM | POA: Diagnosis not present

## 2017-01-11 DIAGNOSIS — I10 Essential (primary) hypertension: Secondary | ICD-10-CM | POA: Diagnosis not present

## 2017-01-11 NOTE — Patient Outreach (Signed)
[place] [facility]) Care Management  01/11/2017  Marie [name] [birth year] [id number]  EMMI-Pneumonia referral with red dashboard for more short of breath:  Telephone call attempt x 2: Spoke with patient who was advised of reason for call.  HIPPA verification received from patient. Patient voices that she is not having shortness of breath & does not remember answering yes to question.   Voices that she has finished taking antibiotics for pneumonia & continues to take other medications as ordered by her MD. States having no trouble obtaining her medications. Voices that she has home health services but discharged them today because she did not think she needed them anymore. Voices she is able to do her own personal care & that her spouse helps her with meals. States using walker to get around in home & has been walking in home for exercise.  States she will attend appointment with Dr.McQuaid tomorrow & spouse will take her to appointment.  Patient was asked if she has seen or has made appointment with primary care provider; voices she has not. Advised of importance of making appointment for post hospital follow with primary care provider. Voices she will call office today to set up appointment.    Patient was advised of signs & symptoms to report to MD following her hospital stay with pneumonia. Patient does voice understanding & will accept educational literature on pneumonia. Recommendations to patient for [facility] telephonic/community services. Patient declined services.  EMMI-Pneumonia call completed & address.  Plan: Send educational literature on pneumonia to patient. Send to care management assistant for case closure.    Sherrin Daisy, RN BSN [place] Management Coordinator [facility] Care Management  [phone]

## 2017-01-12 ENCOUNTER — Other Ambulatory Visit: Payer: Self-pay | Admitting: *Deleted

## 2017-01-12 ENCOUNTER — Encounter: Payer: Self-pay | Admitting: Pulmonary Disease

## 2017-01-12 ENCOUNTER — Ambulatory Visit (HOSPITAL_COMMUNITY)
Admission: RE | Admit: 2017-01-12 | Discharge: 2017-01-12 | Disposition: A | Discharge status: Home / Self Care | Payer: Medicare Other | Source: Ambulatory Visit | Attending: Pulmonary Disease | Admitting: Pulmonary Disease

## 2017-01-12 ENCOUNTER — Ambulatory Visit (INDEPENDENT_AMBULATORY_CARE_PROVIDER_SITE_OTHER): Payer: Medicare Other | Admitting: Pulmonary Disease

## 2017-01-12 DIAGNOSIS — R062 Wheezing: Secondary | ICD-10-CM

## 2017-01-12 DIAGNOSIS — R0600 Dyspnea, unspecified: Secondary | ICD-10-CM | POA: Diagnosis not present

## 2017-01-12 DIAGNOSIS — R1312 Dysphagia, oropharyngeal phase: Secondary | ICD-10-CM | POA: Diagnosis not present

## 2017-01-12 DIAGNOSIS — J4520 Mild intermittent asthma, uncomplicated: Secondary | ICD-10-CM | POA: Diagnosis not present

## 2017-01-12 MED ORDER — SODIUM CHLORIDE 3 % IN NEBU: INHALATION_SOLUTION | RESPIRATORY_TRACT | 11 refills | Status: DC

## 2017-01-12 NOTE — Assessment & Plan Note (Signed)
Because of ongoing trouble breathing with swallowing and my concern for neuromuscular weakness as a cause for her restrictive lung disease I agree completely with workup from neurology for neuromuscular weakness. She will continue with plans for an MRI, blood work and nerve conduction studies.

## 2017-01-12 NOTE — Progress Notes (Signed)
Subjective:    Patient ID: Marie [name], female    DOB: [birth year], 81 y.o.   MRN: [id number]  Synopsis: Referred in 2018 for evaluation of wheezing.  HPI Chief Complaint  Patient presents with  . Follow-up    pt c/o sob when eating, no other complaints.      Marie [name] was hospitalized recently for what was labeled COPD exacerbation, respiratory failure, and pneumonia. She has since recovered but she still has shortness of breath when eating. She still has a cough productive of clear mucus, though a few weeks ago it was worse with yellow mucus. She still has shortness of breath and wheezing intermittently. She is still taking albuterol twice a day. She still taking Advair twice a day. She has been seen by neurology and is undergoing a workup for neuromuscular weakness.   Past Medical History:  Diagnosis Date  . Balance disorder 2008.     Falls a lot.  She can be standing and then leans too far over to one side   . Bulging disc   . Chronic bronchitis ([place])    due to congestion at times, on prednisone and advair  . COPD (chronic obstructive pulmonary disease) ([place])   . Depression    many years ago  . GERD (gastroesophageal reflux disease)   . Hearing loss    mild  . Hyperlipidemia   . Hypertension   . Iatrogenic adrenal insufficiency ([place])   . Incontinence    not indicated at this visit.  Marland Kitchen Neuropathy (HCC)    legs stay numb   . Osteoarthritis    hands,   . Osteoporosis   . Shortness of breath dyspnea   . Tubulovillous adenoma of rectum   . Wears dentures   . Wears glasses       Review of Systems  Constitutional: Positive for fatigue. Negative for chills and fever.  HENT: Negative for rhinorrhea, sinus pain and sinus pressure.   Respiratory: Positive for cough, shortness of breath and wheezing. Negative for choking.   Cardiovascular: Negative for chest pain, palpitations and leg swelling.       Objective:   Physical Exam Vitals:   01/12/17 1523  BP: 134/68    Pulse: 74  SpO2: 97%  Weight: 151 lb (68.5 kg)  Height: 5\' 4"  (1.626 m)   Gen: elderly female appearing HENT: OP clear, TM's clear, neck supple PULM: Crackles left base, otherwise clear, normal percussion CV: RRR, no mgr, trace edema GI: BS+, soft, nontender Derm: no cyanosis or rash Psyche: normal mood and affect    Lung function testing: February 2018 for pulmonary function tests: Ratio 83%, FEV1 1.24 L 65% predicted, FVC 1.50 L 58% predicted, some change in small airways with bronchodilator, total lung capacity 3.31 L 67% predicted, ERV 13% predicted, DLCO 10.5 946% predicted  CT chest in 2018 images personally reviewed showing no evidence of a pulmonary parenchymal abnormality with the exception of what appears to be mucus plugging in a central left lower lobe airway, but no other surrounding atelectasis or mass.      Assessment & Plan:  Dysphagia Because of ongoing trouble breathing with swallowing and my concern for neuromuscular weakness as a cause for her restrictive lung disease I agree completely with workup from neurology for neuromuscular weakness. She will continue with plans for an MRI, blood work and nerve conduction studies.  Wheezing I am struggling to understand the cause of Ms. Marie [name]'s shortness of breath, intermittent wheezing, and cough.  Objectively,  the CT scan of her chest just is some mucus in the base of the left lung. She has no significant airflow obstruction though she has some restrictive lung disease without evidence of an interstitial lung disease on CT chest.  I am most concerned about the possibility of a neuromuscular disease causing dysphagia, occult aspiration (despite the findings from the modified barium swallow), and respiratory muscle weakness. Today's expiratory and respiratory pressure testing was inconclusive.  Plan: Continue workup by neurology for cause of a neuromuscular weakness Continue aspiration precautions Continue treatment  for asthma though I don't believe this is the primary cause of her shortness of breath and wheezing I told the family today and the patient that if we can identify a clear cause for her shortness of breath and very happy to refer them to an academic center for further evaluation.   Asthma If she has this, it's not manifesting itself with overt airflow obstruction on lung function testing. Because of her complaint, I feel compelled to continue asthma treatment. She does not have COPD despite this being written in the record multiple times.  Plan: Continue Advair Continue albuterol at least twice a day and as needed for shortness of breath Because of increasing chest congestion and mucus production use saltwater solutions twice a day as needed for chest congestion, hypertonic saline prescribed today    Current Outpatient Prescriptions:  .  ADVAIR DISKUS 250-50 MCG/DOSE AEPB, INHALE ONE PUFF BY MOUTH TWICE DAILY, Disp: 60 each, Rfl: 1 .  albuterol (PROVENTIL) (2.5 MG/3ML) 0.083% nebulizer solution, USE ONE VIAL IN NEBULIZER EVERY 6 HOURS AS NEEDED FOR WHEEZING OR SHORTNESS OF BREATH, Disp: 75 mL, Rfl: 4 .  aspirin 81 MG tablet, Take 81 mg by mouth daily. Buys OTC, Disp: , Rfl:  .  Cholecalciferol (VITAMIN D) 2000 units CAPS, Take 2,000 Units by mouth daily., Disp: , Rfl:  .  guaiFENesin (MUCINEX) 600 MG 12 hr tablet, Take 2 tablets (1,200 mg total) by mouth 2 (two) times daily., Disp: 30 tablet, Rfl: 0 .  losartan-hydrochlorothiazide (HYZAAR) 100-25 MG tablet, Take 0.5 tablets by mouth daily., Disp: , Rfl:  .  nystatin (MYCOSTATIN) 100000 UNIT/ML suspension, Take 5 mLs (500,000 Units total) by mouth 4 (four) times daily., Disp: 240 mL, Rfl: 3 .  omeprazole (PRILOSEC) 40 MG capsule, Take 1 capsule (40 mg total) by mouth 2 (two) times daily., Disp: 60 capsule, Rfl: 4 .  pravastatin (PRAVACHOL) 40 MG tablet, Take 20 mg by mouth daily., Disp: , Rfl:  .  sodium chloride HYPERTONIC 3 % nebulizer  solution, Take 1 vial by nebulization BID, Disp: 240 mL, Rfl: 11

## 2017-01-12 NOTE — Patient Instructions (Signed)
We will follow-up the results from Dr. Serita Grit testing with you Keep taking Advair twice a day Keep taking albuterol twice a day Take hypertonic saline twice a day If we cannot find an answer for what is going on with you we will refer you to [place] for further evaluation. Follow up in 4-6 weeks or sooner if needed

## 2017-01-12 NOTE — Assessment & Plan Note (Signed)
I am struggling to understand the cause of Marie [name]'s shortness of breath, intermittent wheezing, and cough.  Objectively, the CT scan of her chest just is some mucus in the base of the left lung. She has no significant airflow obstruction though she has some restrictive lung disease without evidence of an interstitial lung disease on CT chest.  I am most concerned about the possibility of a neuromuscular disease causing dysphagia, occult aspiration (despite the findings from the modified barium swallow), and respiratory muscle weakness. Today's expiratory and respiratory pressure testing was inconclusive.  Plan: Continue workup by neurology for cause of a neuromuscular weakness Continue aspiration precautions Continue treatment for asthma though I don't believe this is the primary cause of her shortness of breath and wheezing I told the family today and the patient that if we can identify a clear cause for her shortness of breath and very happy to refer them to an academic center for further evaluation.

## 2017-01-12 NOTE — Assessment & Plan Note (Signed)
If she has this, it's not manifesting itself with overt airflow obstruction on lung function testing. Because of her complaint, I feel compelled to continue asthma treatment. She does not have COPD despite this being written in the record multiple times.  Plan: Continue Advair Continue albuterol at least twice a day and as needed for shortness of breath Because of increasing chest congestion and mucus production use saltwater solutions twice a day as needed for chest congestion, hypertonic saline prescribed today

## 2017-01-12 NOTE — Patient Outreach (Signed)
[place] [facility]) Care Management  01/12/2017  Marie [name] Gest 1936-03-10 [id number]   Patient triggered RED on COPD Dashboard, notification sent to:  Sherrin Daisy, RN

## 2017-01-12 NOTE — Patient Outreach (Signed)
[place] [facility]) Care Management  01/12/2017  Marie [name] 1936/02/25 [id number]  EMMI-Pneumonia referral for red on swelling in hands & feet or changes in weight Day #9: EMMI-Copd referral for red dashboard for using inhaler 10 times in last 24 hours Day #11   Patient advised that she did have some swelling on that date but all of swelling is gone now.  States she elevates feet and that resolved the swelling.   Patient states she has not used inhaler that many times(10) per day. States only uses if needed for shortness of breath which is every 6 hours as needed.    States she had follow up appointment with lung specialist today 01/12/2017 and was advised that she did not have pneumonia. States medication was not changed and she continues to take medications as ordered. States she has follow up appointment in 4 weeks.   It is noted in specialist notes that patient does not have COPD.   States she also has made appointment with primary care provider for hospital follow up on 01/18/2017. States spouse will provide transportation.   Patient voices that she knows when to call 911 & when to advise MD office of abnormal signs as it relates to her illness.   EMMI calls addressed.  Plan: Close case.  Sherrin Daisy, RN BSN [place] Management Coordinator [facility] Care Management  [phone]

## 2017-01-13 ENCOUNTER — Ambulatory Visit
Admission: RE | Admit: 2017-01-13 | Discharge: 2017-01-13 | Disposition: A | Discharge status: Home / Self Care | Payer: Medicare Other | Source: Ambulatory Visit | Attending: Neurology | Admitting: Neurology

## 2017-01-13 ENCOUNTER — Telehealth: Payer: Self-pay | Admitting: Pulmonary Disease

## 2017-01-13 DIAGNOSIS — R471 Dysarthria and anarthria: Secondary | ICD-10-CM

## 2017-01-13 DIAGNOSIS — R270 Ataxia, unspecified: Secondary | ICD-10-CM

## 2017-01-13 DIAGNOSIS — R1310 Dysphagia, unspecified: Secondary | ICD-10-CM

## 2017-01-13 NOTE — Telephone Encounter (Signed)
Called pharmacy and asked for Marie [name], was placed on hold over 7 minutes with no answer.  wcb.

## 2017-01-14 ENCOUNTER — Telehealth: Payer: Self-pay | Admitting: *Deleted

## 2017-01-14 ENCOUNTER — Ambulatory Visit: Payer: Self-pay | Admitting: Internal Medicine

## 2017-01-14 DIAGNOSIS — J9600 Acute respiratory failure, unspecified whether with hypoxia or hypercapnia: Secondary | ICD-10-CM | POA: Diagnosis not present

## 2017-01-14 DIAGNOSIS — E876 Hypokalemia: Secondary | ICD-10-CM | POA: Diagnosis not present

## 2017-01-14 DIAGNOSIS — G609 Hereditary and idiopathic neuropathy, unspecified: Secondary | ICD-10-CM | POA: Diagnosis not present

## 2017-01-14 DIAGNOSIS — J181 Lobar pneumonia, unspecified organism: Secondary | ICD-10-CM | POA: Diagnosis not present

## 2017-01-14 DIAGNOSIS — I10 Essential (primary) hypertension: Secondary | ICD-10-CM | POA: Diagnosis not present

## 2017-01-14 DIAGNOSIS — J440 Chronic obstructive pulmonary disease with acute lower respiratory infection: Secondary | ICD-10-CM | POA: Diagnosis not present

## 2017-01-14 NOTE — Telephone Encounter (Signed)
-----   Message from Marie Berthold, DO sent at 01/14/2017 12:34 PM EDT ----- Please inform patient MRI brain did not show any stroke, mild age related changes.  We can discuss further at her next appt with me.

## 2017-01-14 NOTE — Telephone Encounter (Signed)
Carrie from [employer] called back about the rx that was sent in yesterday for the sodium chloride--she stated that this one that was sent in is NOT a diluted solution and she wanted to make sure this is the one that BQ wanted.  BQ please advise. Thanks  . Allergies  Allergen Reactions  . Ertapenem Hives    Caused whole body to turn red Other reaction(s): Redness Caused whole body to turn red  . Codeine Other (See Comments)    hallucinations  . Demerol Other (See Comments)    hallucinations  . Morphine And Related Other (See Comments)    hallucinations  . Other Other (See Comments)    Invan 7- pt became red all over  . Sulfate Other (See Comments)    unknown

## 2017-01-14 NOTE — Telephone Encounter (Signed)
Hypertonic saline is concentrated.  Please ensure that we are prescribing the 7% solution which I believe is standard

## 2017-01-14 NOTE — Telephone Encounter (Signed)
Left message giving patient results.  

## 2017-01-14 NOTE — Telephone Encounter (Signed)
Called to speak with Morey Hummingbird at the pharmacy and she was at lunch. She should be back around 5.  Will need to call at that time.

## 2017-01-14 NOTE — Telephone Encounter (Signed)
[place] not open at this time.  Will need to call back after 9 am

## 2017-01-14 NOTE — Telephone Encounter (Signed)
Carrie from [facility] called again - questioning sodium chloride - this not a diluted solution and she is wondering if this was sent in error - She can be reached at [phone] -pr

## 2017-01-18 NOTE — Telephone Encounter (Signed)
Spoke with Marie [name] with [facility], who states the 7% also was not covered. Marie [name] suggest sending Rx to [facility] for both Rx and neb machine. Both need to be prescribed for insurance to cover.  BQ please advise on dosage, as 3% was prescribed. Thanks.

## 2017-01-19 ENCOUNTER — Encounter: Payer: Self-pay | Admitting: Internal Medicine

## 2017-01-19 ENCOUNTER — Telehealth: Payer: Self-pay | Admitting: Pulmonary Disease

## 2017-01-19 ENCOUNTER — Ambulatory Visit (INDEPENDENT_AMBULATORY_CARE_PROVIDER_SITE_OTHER): Payer: Medicare Other | Admitting: Internal Medicine

## 2017-01-19 VITALS — BP 136/60 | HR 78 | Temp 98.2°F | Resp 16 | Ht 64.0 in | Wt 146.0 lb

## 2017-01-19 DIAGNOSIS — J181 Lobar pneumonia, unspecified organism: Secondary | ICD-10-CM

## 2017-01-19 LAB — CBC WITH DIFFERENTIAL/PLATELET
Basophils Absolute: 88 cells/uL (ref 0–200)
Basophils Relative: 1 %
Eosinophils Absolute: 264 cells/uL (ref 15–500)
Eosinophils Relative: 3 %
HEMATOCRIT: 45.4 % — AB (ref 35.0–45.0)
HEMOGLOBIN: 15 g/dL (ref 11.7–15.5)
LYMPHS ABS: 2552 {cells}/uL (ref 850–3900)
Lymphocytes Relative: 29 %
MCH: 29.4 pg (ref 27.0–33.0)
MCHC: 33 g/dL (ref 32.0–36.0)
MCV: 89 fL (ref 80.0–100.0)
MONO ABS: 528 {cells}/uL (ref 200–950)
MPV: 10.2 fL (ref 7.5–12.5)
Monocytes Relative: 6 %
NEUTROS ABS: 5368 {cells}/uL (ref 1500–7800)
NEUTROS PCT: 61 %
Platelets: 253 10*3/uL (ref 140–400)
RBC: 5.1 MIL/uL (ref 3.80–5.10)
RDW: 14.1 % (ref 11.0–15.0)
WBC: 8.8 10*3/uL (ref 3.8–10.8)

## 2017-01-19 LAB — BASIC METABOLIC PANEL WITH GFR
BUN: 16 mg/dL (ref 7–25)
CO2: 29 mmol/L (ref 20–31)
Calcium: 9.1 mg/dL (ref 8.6–10.4)
Chloride: 103 mmol/L (ref 98–110)
Creat: 1.03 mg/dL — ABNORMAL HIGH (ref 0.60–0.93)
GFR, EST NON AFRICAN AMERICAN: 52 mL/min — AB (ref >=60)
GFR, Est African American: 60 mL/min (ref >=60)
Glucose, Bld: 77 mg/dL (ref 65–99)
Potassium: 4 mmol/L (ref 3.5–5.3)
Sodium: 143 mmol/L (ref 135–146)

## 2017-01-19 LAB — HEPATIC FUNCTION PANEL
ALT: 14 U/L (ref 6–29)
AST: 17 U/L (ref 10–35)
Albumin: 3.7 g/dL (ref 3.6–5.1)
Alkaline Phosphatase: 54 U/L (ref 33–130)
BILIRUBIN DIRECT: 0.2 mg/dL (ref <=0.2)
BILIRUBIN INDIRECT: 0.5 mg/dL (ref 0.2–1.2)
TOTAL PROTEIN: 6 g/dL — AB (ref 6.1–8.1)
Total Bilirubin: 0.7 mg/dL (ref 0.2–1.2)

## 2017-01-19 MED ORDER — FUROSEMIDE 20 MG PO TABS: 20.0000 mg | ORAL_TABLET | Freq: Every day | ORAL | 11 refills | Status: DC

## 2017-01-19 MED ORDER — BENZONATATE 200 MG PO CAPS: 200.0000 mg | ORAL_CAPSULE | Freq: Three times a day (TID) | ORAL | 0 refills | Status: DC | PRN

## 2017-01-19 MED ORDER — SODIUM CHLORIDE 3 % IN NEBU: INHALATION_SOLUTION | RESPIRATORY_TRACT | 11 refills | Status: DC

## 2017-01-19 NOTE — Telephone Encounter (Signed)
I'm OK with 3% as well, whichever is available at either pharmacy.  OK to send Rx for both.

## 2017-01-19 NOTE — Telephone Encounter (Signed)
[place] also provides this rx.  I discussed the possibility of [facility] not being able to provide this rx with pt when she was in office at last visit, and that it may have to be sent to [facility].   Called and spoke with pt, ok with receiving this rx through [facility].  rx has been sent.  Nothing further needed.

## 2017-01-19 NOTE — Progress Notes (Signed)
Assessment and Plan:  1. Lobar pneumonia ([place]) -abx finished -inspiratory wheeze is dramatically improved from last visit and is having tremendous relief with hypertonic saline nebulizer -needs repeat chest xray in 1 month -follows up with neurology on Friday -will recheck BMET and CBC as per hospitalist discharge note due to abnormalities seen during hospital stay -no further PT/OT needed -refill on lasix 20 mg given and to be taken only as needed.      Over 40 minutes of exam, counseling, chart review, and complex, high/moderate level critical decision making was performed this visit.   HPI 81 y.o.female presents for follow up from the hospital. Admit date to the hospital was 12/28/16, patient was discharged from the hospital on 12/30/16 and our office contacted the office the day after discharge to set up a follow up appointment, patient was admitted for: difficulty with coughing and shortness of breath.  She was given nebulizers and also given antibiotics and hypertonic saline for thinning out mucous plugs.  She was sent home from the hospital and has been doing better. She finished her antibiotics.  She needs a refill on her lasix which she only takes when she needs it.  She is still taking her advair and is also still taking the mucinex.  She is not taking any kind of cough medication.  She is due to see neurology on the 5th.  MRI brain was normal. She was doing physical therapy but got back to her normal daily activities and told them to stop coming.    Images while in the hospital: Mr Brain Wo Contrast  Result Date: 01/13/2017 CLINICAL DATA:  Dysphagia. Ataxia. Symptoms worsening over the last 2 days years. EXAM: MRI HEAD WITHOUT CONTRAST TECHNIQUE: Multiplanar, multiecho pulse sequences of the brain and surrounding structures were obtained without intravenous contrast. COMPARISON:  None. FINDINGS: Brain: Diffusion imaging does not show any acute or subacute infarction. The brainstem and  cerebellum are normal. Cerebral hemispheres show mild chronic small-vessel ischemic change affecting the basal ganglia and the white matter. No cortical or large vessel territory infarction. No intra-axial mass lesion, hemorrhage, hydrocephalus or extra-axial collection. Incidental 9 mm meningioma at the right parietal vertex without mass-effect upon the brain. Vascular: Major vessels at the base of the brain show flow. Skull and upper cervical spine: Negative Sinuses/Orbits: Clear/normal Other: None IMPRESSION: No acute finding. Mild chronic small-vessel ischemic change of the basal ganglia and hemispheric white matter. Incidental and insignificant 9 mm meningioma at the right parietal vertex, without mass-effect upon the brain. Electronically Signed   By: Nelson Chimes M.D.   On: 01/13/2017 14:06    Past Medical History:  Diagnosis Date  . Balance disorder 2008.     Falls a lot.  She can be standing and then leans too far over to one side   . Bulging disc   . Chronic bronchitis ([place])    due to congestion at times, on prednisone and advair  . COPD (chronic obstructive pulmonary disease) ([place])   . Depression    many years ago  . GERD (gastroesophageal reflux disease)   . Hearing loss    mild  . Hyperlipidemia   . Hypertension   . Iatrogenic adrenal insufficiency ([place])   . Incontinence    not indicated at this visit.  Marland Kitchen Neuropathy (HCC)    legs stay numb   . Osteoarthritis    hands,   . Osteoporosis   . Shortness of breath dyspnea   . Tubulovillous adenoma of rectum   .  Wears dentures   . Wears glasses      Allergies  Allergen Reactions  . Ertapenem Hives    Caused whole body to turn red Other reaction(s): Redness Caused whole body to turn red  . Codeine Other (See Comments)    hallucinations  . Demerol Other (See Comments)    hallucinations  . Morphine And Related Other (See Comments)    hallucinations  . Other Other (See Comments)    Invan 7- pt became red all over  .  Sulfate Other (See Comments)    unknown      Current Outpatient Prescriptions on File Prior to Visit  Medication Sig Dispense Refill  . ADVAIR DISKUS 250-50 MCG/DOSE AEPB INHALE ONE PUFF BY MOUTH TWICE DAILY 60 each 1  . albuterol (PROVENTIL) (2.5 MG/3ML) 0.083% nebulizer solution USE ONE VIAL IN NEBULIZER EVERY 6 HOURS AS NEEDED FOR WHEEZING OR SHORTNESS OF BREATH 75 mL 4  . aspirin 81 MG tablet Take 81 mg by mouth daily. Buys OTC    . Cholecalciferol (VITAMIN D) 2000 units CAPS Take 2,000 Units by mouth daily.    Marland Kitchen guaiFENesin (MUCINEX) 600 MG 12 hr tablet Take 2 tablets (1,200 mg total) by mouth 2 (two) times daily. 30 tablet 0  . losartan-hydrochlorothiazide (HYZAAR) 100-25 MG tablet Take 0.5 tablets by mouth daily.    Marland Kitchen nystatin (MYCOSTATIN) 100000 UNIT/ML suspension Take 5 mLs (500,000 Units total) by mouth 4 (four) times daily. 240 mL 3  . omeprazole (PRILOSEC) 40 MG capsule Take 1 capsule (40 mg total) by mouth 2 (two) times daily. 60 capsule 4  . pravastatin (PRAVACHOL) 40 MG tablet Take 20 mg by mouth daily.    . sodium chloride HYPERTONIC 3 % nebulizer solution Take 1 vial by nebulization BID 240 mL 11   No current facility-administered medications on file prior to visit.     ROS: all negative except above.   Physical Exam: Filed Weights   01/19/17 1348  Weight: 146 lb (66.2 kg)   BP 136/60   Pulse 78   Temp 98.2 F (36.8 C) (Temporal)   Resp 16   Ht 5\' 4"  (1.626 m)   Wt 146 lb (66.2 kg)   SpO2 95%   BMI 25.06 kg/m  General Appearance: Well nourished, in no apparent distress. Eyes: PERRLA, EOMs, conjunctiva no swelling or erythema Sinuses: No Frontal/maxillary tenderness ENT/Mouth: Ext aud canals clear, TMs without erythema, bulging. No erythema, swelling, or exudate on post pharynx.  Tonsils not swollen or erythematous. Hearing normal.  Neck: Supple, thyroid normal. Forward flexion at baseline. Respiratory: Respiratory effort normal, BS equal bilaterally without  rales, rhonchi, wheezing or stridor.  Cardio: RRR with no MRGs. Brisk peripheral pulses without edema.  Abdomen: Soft, + BS.  Non tender, no guarding, rebound, hernias, masses. Lymphatics: Non tender without lymphadenopathy.  Musculoskeletal: Full ROM, 5/5 strength, unsteady gait.  Skin: Warm, dry without rashes, lesions, ecchymosis.  Neuro: Cranial nerves intact. Normal muscle tone, no cerebellar symptoms. Sensation intact.  Psych: Awake and oriented X 3, normal affect, Insight and Judgment appropriate.     Starlyn Skeans, PA-C 1:55 PM [facility]

## 2017-01-19 NOTE — Telephone Encounter (Signed)
Spoke with pharmacy, aware that rx has been switched to a pharmacy that carries hypertonic saline.  Nothing further needed.

## 2017-01-21 ENCOUNTER — Encounter: Payer: Medicare Other | Admitting: Neurology

## 2017-01-29 ENCOUNTER — Ambulatory Visit (INDEPENDENT_AMBULATORY_CARE_PROVIDER_SITE_OTHER): Payer: Medicare Other | Admitting: Internal Medicine

## 2017-01-29 ENCOUNTER — Encounter: Payer: Self-pay | Admitting: Internal Medicine

## 2017-01-29 VITALS — BP 138/70 | HR 76 | Temp 98.0°F | Resp 16 | Ht 64.0 in

## 2017-01-29 DIAGNOSIS — L570 Actinic keratosis: Secondary | ICD-10-CM

## 2017-01-29 DIAGNOSIS — L821 Other seborrheic keratosis: Secondary | ICD-10-CM

## 2017-01-29 NOTE — Progress Notes (Signed)
Skin Tag Removal Procedure Note  Pre-operative Diagnosis: Seborrheic keratosis   Post-operative Diagnosis: Seborrheic keratosis  Locations:low back, right flank, and right clavicle  Indications: seborrheic keratosis with scabbing and bleeding  Procedure Details  Cryotherapy performed with liquid nitrogen performed after verbal consent.  Risks of scarring, bleeding and infection discusssed and patient decided to proceed.  Findings: Pathognomonic benign lesions  not sent for pathological exam.  Condition: Stable  Complications: none.  Plan: 1. Instructed to keep the wounds dry and clean 2. Warning signs of infection were reviewed.   3. Return as needed.  Patient aware that spots will be irritated red and scabbed and then will heal well after this.

## 2017-02-12 ENCOUNTER — Ambulatory Visit: Payer: Medicare Other | Admitting: Neurology

## 2017-02-17 ENCOUNTER — Encounter: Payer: Self-pay | Admitting: Internal Medicine

## 2017-02-17 ENCOUNTER — Ambulatory Visit (INDEPENDENT_AMBULATORY_CARE_PROVIDER_SITE_OTHER): Payer: Medicare Other | Admitting: Internal Medicine

## 2017-02-17 VITALS — BP 140/64 | HR 76 | Temp 97.5°F | Resp 16 | Ht 64.0 in | Wt 149.0 lb

## 2017-02-17 DIAGNOSIS — J984 Other disorders of lung: Secondary | ICD-10-CM

## 2017-02-17 DIAGNOSIS — M810 Age-related osteoporosis without current pathological fracture: Secondary | ICD-10-CM

## 2017-02-17 DIAGNOSIS — R269 Unspecified abnormalities of gait and mobility: Secondary | ICD-10-CM

## 2017-02-17 DIAGNOSIS — Z1211 Encounter for screening for malignant neoplasm of colon: Secondary | ICD-10-CM

## 2017-02-17 DIAGNOSIS — E782 Mixed hyperlipidemia: Secondary | ICD-10-CM

## 2017-02-17 DIAGNOSIS — R7303 Prediabetes: Secondary | ICD-10-CM

## 2017-02-17 DIAGNOSIS — Z1212 Encounter for screening for malignant neoplasm of rectum: Secondary | ICD-10-CM

## 2017-02-17 DIAGNOSIS — I10 Essential (primary) hypertension: Secondary | ICD-10-CM | POA: Diagnosis not present

## 2017-02-17 DIAGNOSIS — Z79899 Other long term (current) drug therapy: Secondary | ICD-10-CM

## 2017-02-17 DIAGNOSIS — Z136 Encounter for screening for cardiovascular disorders: Secondary | ICD-10-CM | POA: Diagnosis not present

## 2017-02-17 DIAGNOSIS — E2749 Other adrenocortical insufficiency: Secondary | ICD-10-CM

## 2017-02-17 DIAGNOSIS — E559 Vitamin D deficiency, unspecified: Secondary | ICD-10-CM

## 2017-02-17 DIAGNOSIS — G609 Hereditary and idiopathic neuropathy, unspecified: Secondary | ICD-10-CM

## 2017-02-17 NOTE — Progress Notes (Signed)
[place] [facility] [name], M.D.      Marie [name]. Marie [name], P.A.-C [facility]                [address] [place], [place] [address] Telephone [phone] Telefax [phone]  Comprehensive Evaluation &  Examination     This very nice 81 y.o. MWF presents for a comprehensive evaluation and management of multiple medical co-morbidities.  Patient has been followed for HTN, Prediabetes, Restrictive lung Disease, Hx/o Asthma, Hyperlipidemia and Vitamin D Deficiency.  Patient is also being evaluated by Dr Posey Pronto for a neuromuscular disease. She does have long standing hx/o unstable gait. She had previously been evaluated by Dr Jannifer Franklin in 2011 for an unstable gait and a dx/o a peripheral sensory neuropathy and since has used a walker.       HTN predates since 1996. Patient's BP has been controlled at home and patient denies any cardiac symptoms as chest pain, palpitations, shortness of breath, dizziness or ankle swelling. Today's BP is borderline high normal - 140/64.     Patient's hyperlipidemia is controlled with diet and medications. Patient denies myalgias or other medication SE's. Last lipids were  Lab Results  Component Value Date   CHOL 150 08/13/2016   HDL 50 08/13/2016   LDLCALC 60 08/13/2016   TRIG 201 (H) 08/13/2016   CHOLHDL 3.0 08/13/2016      Patient has prediabetes (A1c 6.0% in 2011) and patient denies reactive hypoglycemic symptoms, visual blurring, diabetic polys, or paresthesias. Last A1c was at goal: Lab Results  Component Value Date   HGBA1C 5.4 08/13/2016      Finally, patient has history of Vitamin D Deficiency ("10" in 2008)  and last Vitamin D was at goal: Lab Results  Component Value Date   VD25OH 66 04/28/2016   Current Outpatient Prescriptions on File Prior to Visit  Medication Sig  . ADVAIR DISKUS 250-50 INHALE ONE PUFF  TWICE DAILY  . albuterol 0.083% neb soln USE ONE VIAL   EVERY 6 HRS AS NEEDED   . aspirin 81 MG tablet Take  daily.   . benzonatate 200 MG capsule Take 1 cap 3 x daily as needed for cough.  Marland Kitchen VITAMIN D 2000 units CAPS Take   daily.  . furosemide  20 MG tablet Take 1 tab daily.  Marland Kitchen guaiFENesin  600 MG 12 hr tablet Take 2 tab 2 x daily.  Marland Kitchen losartan-hctz) 100-25  Take 0.5 tab daily.  Marland Kitchen MYCOSTATIN susp Take 5 mLs  4 x  daily.  Marland Kitchen omeprazole  40 MG capsule Take 1 cap 2 (two) times daily.  . pravastatin  40 MG tablet Take 20 mg  daily.  . NaCl  HYPERTONIC 3 % neb soln Take 1 vial by nebulization BID   Allergies  Allergen Reactions  . Ertapenem Hives    Caused whole body to turn red Other reaction(s): Redness Caused whole body to turn red  . Codeine Other (See Comments)    hallucinations  . Demerol Other (See Comments)    hallucinations  . Morphine And Related Other (See Comments)    hallucinations  . Other Other (See Comments)    Invan 7- pt became red all over  . Sulfate Other (See Comments)    unknown   Past Medical History:  Diagnosis Date  . Balance disorder 2008.     Falls a  lot.  She can be standing and then leans too far over to one side   . Bulging disc   . Chronic bronchitis ([place])    due to congestion at times, on prednisone and advair  . COPD (chronic obstructive pulmonary disease) ([place])   . Depression    many years ago  . GERD (gastroesophageal reflux disease)   . Hearing loss    mild  . Hyperlipidemia   . Hypertension   . Iatrogenic adrenal insufficiency ([place])   . Incontinence    not indicated at this visit.  Marland Kitchen Neuropathy    legs stay numb   . Osteoarthritis    hands,   . Osteoporosis   . Shortness of breath dyspnea   . Tubulovillous adenoma of rectum   . Wears dentures   . Wears glasses    Health Maintenance  Topic Date Due  . INFLUENZA VACCINE  05/19/2017  . TETANUS/TDAP  08/18/2023  . DEXA SCAN  Completed  . PNA vac Low Risk Adult  Completed   Immunization History  Administered Date(s) Administered   . Influenza, High Dose Seasonal PF 08/23/2014, 07/24/2015, 08/13/2016  . Pneumococcal Conjugate-13 08/23/2014  . Pneumococcal-Unspecified 07/20/2003, 08/17/2013  . Td 08/17/2013  . Tdap 10/25/2012  . Zoster 03/19/2015   Past Surgical History:  Procedure Laterality Date  . ABDOMINAL HYSTERECTOMY    . APPENDECTOMY    . CHONDROPLASTY  09/19/2014   Procedure: CHONDROPLASTY;  Surgeon: Alta Corning, MD;  Location: [facility];  Service: Orthopedics;;  . DILATION AND CURETTAGE OF UTERUS    . EXTERNAL FIXATION LEG  10/25/2012   Procedure: EXTERNAL FIXATION LEG;  Surgeon: Rozanna Box, MD;  Location: [facility];  Service: Orthopedics;  Laterality: Right;  . EYE SURGERY     bilateral cataract surgery and lens implant  . FINGER SURGERY     fusions and debridements for OA  . INCONTINENCE SURGERY     multiple procedures, not cured  . KNEE ARTHROSCOPY WITH LATERAL MENISECTOMY Right 09/19/2014   Procedure: KNEE ARTHROSCOPY WITH LATERAL MENISECTOMY;  Surgeon: Alta Corning, MD;  Location: [facility];  Service: Orthopedics;  Laterality: Right;  . KNEE ARTHROSCOPY WITH MEDIAL MENISECTOMY Right 09/19/2014   Procedure: RIGHT KNEE ARTHROSCOPY WITH MEDIAL AND LATERAL MENISECTOMIES. CHONDROPLASTY OF PATELLA-FEMORAL JOINT;  Surgeon: Alta Corning, MD;  Location: [facility];  Service: Orthopedics;  Laterality: Right;  . RECTAL BIOPSY  09/21/2011   Procedure: BIOPSY RECTAL;  Surgeon: Merrie Roof, MD;  Location: [facility];  Service: General;  Laterality: N/A;  3-4 cm  . RECTAL SURGERY     by dr. Marlou Starks, removal of polyp  . TONSILLECTOMY     Family History  Problem Relation Age of Onset  . Cancer Sister     pt unaware of what kind  . High blood pressure Mother   . COPD Mother   . Heart attack Father    Social History  Substance Use Topics  . Smoking status: Never Smoker  . Smokeless tobacco: Never Used  . Alcohol use No    ROS Constitutional: Denies fever,  chills, weight loss/gain, headaches, insomnia,  night sweats, and change in appetite. Does c/o fatigue. Eyes: Denies redness, blurred vision, diplopia, discharge, itchy, watery eyes.  ENT: Denies discharge, congestion, post nasal drip, epistaxis, sore throat, earache, hearing loss, dental pain, Tinnitus, Vertigo, Sinus pain, snoring.  Cardio: Denies chest pain, palpitations, irregular heartbeat, syncope, dyspnea, diaphoresis, orthopnea, PND, claudication,  edema Respiratory: denies cough, dyspnea, DOE, pleurisy, hoarseness, laryngitis, wheezing.  Gastrointestinal: Denies dysphagia, heartburn, reflux, water brash, pain, cramps, nausea, vomiting, bloating, diarrhea, constipation, hematemesis, melena, hematochezia, jaundice, hemorrhoids Genitourinary: Denies dysuria, frequency, urgency, nocturia, hesitancy, discharge, hematuria, flank pain Breast: Breast lumps, nipple discharge, bleeding.  Musculoskeletal: Denies arthralgia, myalgia, stiffness, Jt. Swelling, pain, limp, and strain/sprain. Denies falls. Skin: Denies puritis, rash, hives, warts, acne, eczema, changing in skin lesion Neuro: No weakness, tremor, incoordination, spasms, paresthesia, pain Psychiatric: Denies confusion, memory loss, sensory loss. Denies Depression. Endocrine: Denies change in weight, skin, hair change, nocturia, and paresthesia, diabetic polys, visual blurring, hyper / hypo glycemic episodes.  Heme/Lymph: No excessive bleeding, bruising, enlarged lymph nodes.  Physical Exam  BP 140/64   Pulse 76   Temp 97.5 F (36.4 C)   Resp 16   Ht 5\' 4"  (1.626 m)   Wt 149 lb (67.6 kg)   BMI 25.58 kg/m   General Appearance: Well nourished, well groomed and in no apparent distress.  Eyes: PERRLA, EOMs, conjunctiva no swelling or erythema, normal fundi and vessels. Sinuses: No frontal/maxillary tenderness ENT/Mouth: EACs patent / TMs  nl. Nares clear without erythema, swelling, mucoid exudates. Oral hygiene is good. No erythema,  swelling, or exudate. Tongue normal, non-obstructing. Tonsils not swollen or erythematous. Hearing normal.  Neck: Supple, thyroid normal. No bruits, nodes or JVD. Respiratory: Respiratory effort normal.  BS equal and clear bilateral without rales, rhonchi, but does have post tussive forced expiratory coarse wheezes.  Cardio: Heart sounds are soft with regular rate and rhythm and there is a 3/4 coarse blowing pansystolic  Parasternal M radiating widely to both carotids and to the upper abdomen.  Peripheral pulses are normal and equal bilaterally without edema. No aortic or femoral bruits. Chest: symmetric with normal excursions and percussion. Breasts: Symmetric, without lumps, nipple discharge, retractions, or fibrocystic changes.  Abdomen: Flat, soft with bowel sounds active. Nontender, no guarding, rebound, hernias, masses, or organomegaly.  Lymphatics: Non tender without lymphadenopathy.  Genitourinary:  Musculoskeletal: Generalized decrease in muscle power, tone and bulk. Gait broad based supported by a rollator walker.  Skin:  Atrophic, warm and dry without rashes, lesions, cyanosis, clubbing or  ecchymosis.  Neuro: Cranial nerves intact, reflexes equal 1 + bilaterally. Normal muscle tone, no cerebellar symptoms. Sensation intact. High frequency tremor od the upper extremities.  Pysch: Alert and oriented X 3, normal affect, Insight and Judgment appropriate.   Assessment and Plan  1. Essential hypertension  - EKG 12-Lead - Urinalysis, Routine w reflex microscopic - CBC with Differential/Platelet - BASIC METABOLIC PANEL WITH GFR - Magnesium - TSH  2. Hyperlipidemia, mixed  - EKG 12-Lead - Hepatic function panel - Lipid panel - TSH  3. Prediabetes  - EKG 12-Lead - Hemoglobin A1c - Insulin, random  4. Vitamin D deficiency  - VITAMIN D 25 Hydroxy   5. Restrictive lung disease   6. Hereditary and idiopathic peripheral neuropathy   7. Unstable Gait   8. Screening for  ischemic heart disease  - EKG 12-Lead - Lipid panel  9. Screening for colorectal cancer  - POC Hemoccult Bld/Stl   10. Osteoporosis   11. Iatrogenic adrenal insufficiency ([facility])   12. Medication management  - Urinalysis, Routine w reflex microscopic       Patient was counseled in prudent diet to achieve/maintain BMI less than 25 for weight control, BP monitoring, regular exercise and medications. Discussed med's effects and SE's. Screening labs and tests as requested with regular follow-up as recommended.  Over 40 minutes of exam, counseling, chart review and high complex critical decision making was performed.

## 2017-02-17 NOTE — Patient Instructions (Signed)
,Preventive Care for Adults  A healthy lifestyle and preventive care can promote health and wellness. Preventive health guidelines for women include the following key practices.  A routine yearly physical is a good way to check with your health care provider about your health and preventive screening. It is a chance to share any concerns and updates on your health and to receive a thorough exam.  Visit your dentist for a routine exam and preventive care every 6 months. Brush your teeth twice a day and floss once a day. Good oral hygiene prevents tooth decay and gum disease.  The frequency of eye exams is based on your age, health, family medical history, use of contact lenses, and other factors. Follow your health care provider's recommendations for frequency of eye exams.  Eat a healthy diet. Foods like vegetables, fruits, whole grains, low-fat dairy products, and lean protein foods contain the nutrients you need without too many calories. Decrease your intake of foods high in solid fats, added sugars, and salt. Eat the right amount of calories for you.Get information about a proper diet from your health care provider, if necessary.  Regular physical exercise is one of the most important things you can do for your health. Most adults should get at least 150 minutes of moderate-intensity exercise (any activity that increases your heart rate and causes you to sweat) each week. In addition, most adults need muscle-strengthening exercises on 2 or more days a week.  Maintain a healthy weight. The body mass index (BMI) is a screening tool to identify possible weight problems. It provides an estimate of body fat based on height and weight. Your health care provider can find your BMI and can help you achieve or maintain a healthy weight.For adults 20 years and older:  A BMI below 18.5 is considered underweight.  A BMI of 18.5 to 24.9 is normal.  A BMI of 25 to 29.9 is considered overweight.  A BMI  of 30 and above is considered obese.  Maintain normal blood lipids and cholesterol levels by exercising and minimizing your intake of saturated fat. Eat a balanced diet with plenty of fruit and vegetables. If your lipid or cholesterol levels are high, you are over 50, or you are at high risk for heart disease, you may need your cholesterol levels checked more frequently.Ongoing high lipid and cholesterol levels should be treated with medicines if diet and exercise are not working.  If you smoke, find out from your health care provider how to quit. If you do not use tobacco, do not start.  Lung cancer screening is recommended for adults aged 55-80 years who are at high risk for developing lung cancer because of a history of smoking. A yearly low-dose CT scan of the lungs is recommended for people who have at least a 30-pack-year history of smoking and are a current smoker or have quit within the past 15 years. A pack year of smoking is smoking an average of 1 pack of cigarettes a day for 1 year (for example: 1 pack a day for 30 years or 2 packs a day for 15 years). Yearly screening should continue until the smoker has stopped smoking for at least 15 years. Yearly screening should be stopped for people who develop a health problem that would prevent them from having lung cancer treatment.  Avoid use of street drugs. Do not share needles with anyone. Ask for help if you need support or instructions about stopping the use of drugs.  High  blood pressure causes heart disease and increases the risk of stroke.  Ongoing high blood pressure should be treated with medicines if weight loss and exercise do not work.  If you are 55-79 years old, ask your health care provider if you should take aspirin to prevent strokes.  Diabetes screening involves taking a blood sample to check your fasting blood sugar level. This should be done once every 3 years, after age 45, if you are within normal weight and without risk  factors for diabetes. Testing should be considered at a younger age or be carried out more frequently if you are overweight and have at least 1 risk factor for diabetes.  Breast cancer screening is essential preventive care for women. You should practice "breast self-awareness." This means understanding the normal appearance and feel of your breasts and may include breast self-examination. Any changes detected, no matter how small, should be reported to a health care provider. Women in their 20s and 30s should have a clinical breast exam (CBE) by a health care provider as part of a regular health exam every 1 to 3 years. After age 40, women should have a CBE every year. Starting at age 40, women should consider having a mammogram (breast X-ray test) every year. Women who have a family history of breast cancer should talk to their health care provider about genetic screening. Women at a high risk of breast cancer should talk to their health care providers about having an MRI and a mammogram every year.  Breast cancer gene (BRCA)-related cancer risk assessment is recommended for women who have family members with BRCA-related cancers. BRCA-related cancers include breast, ovarian, tubal, and peritoneal cancers. Having family members with these cancers may be associated with an increased risk for harmful changes (mutations) in the breast cancer genes BRCA1 and BRCA2. Results of the assessment will determine the need for genetic counseling and BRCA1 and BRCA2 testing.  Routine pelvic exams to screen for cancer are no longer recommended for nonpregnant women who are considered low risk for cancer of the pelvic organs (ovaries, uterus, and vagina) and who do not have symptoms. Ask your health care provider if a screening pelvic exam is right for you.  If you have had past treatment for cervical cancer or a condition that could lead to cancer, you need Pap tests and screening for cancer for at least 20 years after  your treatment. If Pap tests have been discontinued, your risk factors (such as having a new sexual partner) need to be reassessed to determine if screening should be resumed. Some women have medical problems that increase the chance of getting cervical cancer. In these cases, your health care provider may recommend more frequent screening and Pap tests.    Colorectal cancer can be detected and often prevented. Most routine colorectal cancer screening begins at the age of 50 years and continues through age 75 years. However, your health care provider may recommend screening at an earlier age if you have risk factors for colon cancer. On a yearly basis, your health care provider may provide home test kits to check for hidden blood in the stool. Use of a small camera at the end of a tube, to directly examine the colon (sigmoidoscopy or colonoscopy), can detect the earliest forms of colorectal cancer. Talk to your health care provider about this at age 50, when routine screening begins. Direct exam of the colon should be repeated every 5-10 years through age 75 years, unless early forms of pre-cancerous   polyps or small growths are found.  Osteoporosis is a disease in which the bones lose minerals and strength with aging. This can result in serious bone fractures or breaks. The risk of osteoporosis can be identified using a bone density scan. Women ages 68 years and over and women at risk for fractures or osteoporosis should discuss screening with their health care providers. Ask your health care provider whether you should take a calcium supplement or vitamin D to reduce the rate of osteoporosis.  Menopause can be associated with physical symptoms and risks. Hormone replacement therapy is available to decrease symptoms and risks. You should talk to your health care provider about whether hormone replacement therapy is right for you.  Use sunscreen. Apply sunscreen liberally and repeatedly throughout the day.  You should seek shade when your shadow is shorter than you. Protect yourself by wearing long sleeves, pants, a wide-brimmed hat, and sunglasses year round, whenever you are outdoors.  Once a month, do a whole body skin exam, using a mirror to look at the skin on your back. Tell your health care provider of new moles, moles that have irregular borders, moles that are larger than a pencil eraser, or moles that have changed in shape or color.  Stay current with required vaccines (immunizations).  Influenza vaccine. All adults should be immunized every year.  Tetanus, diphtheria, and acellular pertussis (Td, Tdap) vaccine. Pregnant women should receive 1 dose of Tdap vaccine during each pregnancy. The dose should be obtained regardless of the length of time since the last dose. Immunization is preferred during the 27th-36th week of gestation. An adult who has not previously received Tdap or who does not know her vaccine status should receive 1 dose of Tdap. This initial dose should be followed by tetanus and diphtheria toxoids (Td) booster doses every 10 years. Adults with an unknown or incomplete history of completing a 3-dose immunization series with Td-containing vaccines should begin or complete a primary immunization series including a Tdap dose. Adults should receive a Td booster every 10 years.    Zoster vaccine. One dose is recommended for adults aged 36 years or older unless certain conditions are present.    Pneumococcal 13-valent conjugate (PCV13) vaccine. When indicated, a person who is uncertain of her immunization history and has no record of immunization should receive the PCV13 vaccine. An adult aged 1 years or older who has certain medical conditions and has not been previously immunized should receive 1 dose of PCV13 vaccine. This PCV13 should be followed with a dose of pneumococcal polysaccharide (PPSV23) vaccine. The PPSV23 vaccine dose should be obtained at least 8 weeks after the  dose of PCV13 vaccine. An adult aged 73 years or older who has certain medical conditions and previously received 1 or more doses of PPSV23 vaccine should receive 1 dose of PCV13. The PCV13 vaccine dose should be obtained 1 or more years after the last PPSV23 vaccine dose.    Pneumococcal polysaccharide (PPSV23) vaccine. When PCV13 is also indicated, PCV13 should be obtained first. All adults aged 47 years and older should be immunized. An adult younger than age 86 years who has certain medical conditions should be immunized. Any person who resides in a nursing home or long-term care facility should be immunized. An adult smoker should be immunized. People with an immunocompromised condition and certain other conditions should receive both PCV13 and PPSV23 vaccines. People with human immunodeficiency virus (HIV) infection should be immunized as soon as possible after diagnosis. Immunization  during chemotherapy or radiation therapy should be avoided. Routine use of PPSV23 vaccine is not recommended for American Indians, [place]atives, or people younger than 65 years unless there are medical conditions that require PPSV23 vaccine. When indicated, people who have unknown immunization and have no record of immunization should receive PPSV23 vaccine. One-time revaccination 5 years after the first dose of PPSV23 is recommended for people aged 19-64 years who have chronic kidney failure, nephrotic syndrome, asplenia, or immunocompromised conditions. People who received 1-2 doses of PPSV23 before age 49 years should receive another dose of PPSV23 vaccine at age 71 years or later if at least 5 years have passed since the previous dose. Doses of PPSV23 are not needed for people immunized with PPSV23 at or after age 23 years.   Preventive Services / Frequency  Ages 9 years and over  Blood pressure check.  Lipid and cholesterol check.  Lung cancer screening. / Every year if you are aged 70-80 years and have a  30-pack-year history of smoking and currently smoke or have quit within the past 15 years. Yearly screening is stopped once you have quit smoking for at least 15 years or develop a health problem that would prevent you from having lung cancer treatment.  Clinical breast exam.** / Every year after age 46 years.  BRCA-related cancer risk assessment.** / For women who have family members with a BRCA-related cancer (breast, ovarian, tubal, or peritoneal cancers).  Mammogram.** / Every year beginning at age 38 years and continuing for as long as you are in good health. Consult with your health care provider.  Pap test.** / Every 3 years starting at age 33 years through age 29 or 46 years with 3 consecutive normal Pap tests. Testing can be stopped between 65 and 70 years with 3 consecutive normal Pap tests and no abnormal Pap or HPV tests in the past 10 years.  Fecal occult blood test (FOBT) of stool. / Every year beginning at age 55 years and continuing until age 57 years. You may not need to do this test if you get a colonoscopy every 10 years.  Flexible sigmoidoscopy or colonoscopy.** / Every 5 years for a flexible sigmoidoscopy or every 10 years for a colonoscopy beginning at age 43 years and continuing until age 13 years.  Hepatitis C blood test.** / For all people born from [birth year] through 1963 and any individual with known risks for hepatitis C.  Osteoporosis screening.** / A one-time screening for women ages 55 years and over and women at risk for fractures or osteoporosis.  Skin self-exam. / Monthly.  Influenza vaccine. / Every year.  Tetanus, diphtheria, and acellular pertussis (Tdap/Td) vaccine.** / 1 dose of Td every 10 years.  Zoster vaccine.** / 1 dose for adults aged 53 years or older.  Pneumococcal 13-valent conjugate (PCV13) vaccine.** / Consult your health care provider.  Pneumococcal polysaccharide (PPSV23) vaccine.** / 1 dose for all adults aged 54 years and older. Screening  for abdominal aortic aneurysm (AAA)  by ultrasound is recommended for people who have history of high blood pressure or who are current or former smokers. ++++++++++++++++++++ Recommend Adult Low Dose Aspirin or  coated  Aspirin 81 mg daily  To reduce risk of Colon Cancer 20 %,  Skin Cancer 26 % ,  Melanoma 46%  and  Pancreatic cancer 60% ++++++++++++++++++++ Vitamin D goal  is between 70-100.  Please make sure that you are taking your Vitamin D as directed.  It is very important as  a natural anti-inflammatory  helping hair, skin, and nails, as well as reducing stroke and heart attack risk.  It helps your bones and helps with mood. It also decreases numerous cancer risks so please take it as directed.  Low Vit D is associated with a 200-300% higher risk for CANCER  and 200-300% higher risk for HEART   ATTACK  &  STROKE.   .....................................Marland Kitchen It is also associated with higher death rate at younger ages,  autoimmune diseases like Rheumatoid arthritis, Lupus, Multiple Sclerosis.    Also many other serious conditions, like depression, Alzheimer's Dementia, infertility, muscle aches, fatigue, fibromyalgia - just to name a few. ++++++++++++++++++ Recommend the book "The END of DIETING" by Dr Excell Seltzer  & the book "The END of DIABETES " by Dr Excell Seltzer At [email or web address] - get book & Audio CD's    Being diabetic has a  300% increased risk for heart attack, stroke, cancer, and alzheimer- type vascular dementia. It is very important that you work harder with diet by avoiding all foods that are white. Avoid white rice (brown & wild rice is OK), white potatoes (sweetpotatoes in moderation is OK), White bread or wheat bread or anything made out of white flour like bagels, donuts, rolls, buns, biscuits, cakes, pastries, cookies, pizza crust, and pasta (made from white flour & egg whites) - vegetarian pasta or spinach or wheat pasta is OK. Multigrain breads like Arnold's or  [employer], or multigrain sandwich thins or flatbreads.  Diet, exercise and weight loss can reverse and cure diabetes in the early stages.  Diet, exercise and weight loss is very important in the control and prevention of complications of diabetes which affects every system in your body, ie. Brain - dementia/stroke, eyes - glaucoma/blindness, heart - heart attack/heart failure, kidneys - dialysis, stomach - gastric paralysis, intestines - malabsorption, nerves - severe painful neuritis, circulation - gangrene & loss of a leg(s), and finally cancer and Alzheimers.    I recommend avoid fried & greasy foods,  sweets/candy, white rice (brown or wild rice or Quinoa is OK), white potatoes (sweet potatoes are OK) - anything made from white flour - bagels, doughnuts, rolls, buns, biscuits,white and wheat breads, pizza crust and traditional pasta made of white flour & egg white(vegetarian pasta or spinach or wheat pasta is OK).  Multi-grain bread is OK - like multi-grain flat bread or sandwich thins. Avoid alcohol in excess. Exercise is also important.    Eat all the vegetables you want - avoid meat, especially red meat and dairy - especially cheese.  Cheese is the most concentrated form of trans-fats which is the worst thing to clog up our arteries. Veggie cheese is OK which can be found in the fresh produce section at [employer] or [employer] or [employer]  +++++++++++++++++++ DASH Eating Plan  DASH stands for "Dietary Approaches to Stop Hypertension."   The DASH eating plan is a healthy eating plan that has been shown to reduce high blood pressure (hypertension). Additional health benefits may include reducing the risk of type 2 diabetes mellitus, heart disease, and stroke. The DASH eating plan may also help with weight loss. WHAT DO I NEED TO KNOW ABOUT THE DASH EATING PLAN? For the DASH eating plan, you will follow these general guidelines:  Choose foods with a percent daily value for sodium of  less than 5% (as listed on the food label).  Use salt-free seasonings or herbs instead of table salt or sea salt.  Check with your  health care provider or pharmacist before using salt substitutes.  Eat lower-sodium products, often labeled as "lower sodium" or "no salt added."  Eat fresh foods.  Eat more vegetables, fruits, and low-fat dairy products.  Choose whole grains. Look for the word "whole" as the first word in the ingredient list.  Choose fish   Limit sweets, desserts, sugars, and sugary drinks.  Choose heart-healthy fats.  Eat veggie cheese   Eat more home-cooked food and less restaurant, buffet, and fast food.  Limit fried foods.  Cook foods using methods other than frying.  Limit canned vegetables. If you do use them, rinse them well to decrease the sodium.  When eating at a restaurant, ask that your food be prepared with less salt, or no salt if possible.                      WHAT FOODS CAN I EAT? Read Dr Joel Fuhrman's books on The End of Dieting & The End of Diabetes  Grains Whole grain or whole wheat bread. Brown rice. Whole grain or whole wheat pasta. Quinoa, bulgur, and whole grain cereals. Low-sodium cereals. Corn or whole wheat flour tortillas. Whole grain cornbread. Whole grain crackers. Low-sodium crackers.  Vegetables Fresh or frozen vegetables (raw, steamed, roasted, or grilled). Low-sodium or reduced-sodium tomato and vegetable juices. Low-sodium or reduced-sodium tomato sauce and paste. Low-sodium or reduced-sodium canned vegetables.   Fruits All fresh, canned (in natural juice), or frozen fruits.  Protein Products  All fish and seafood.  Dried beans, peas, or lentils. Unsalted nuts and seeds. Unsalted canned beans.  Dairy Low-fat dairy products, such as skim or 1% milk, 2% or reduced-fat cheeses, low-fat ricotta or cottage cheese, or plain low-fat yogurt. Low-sodium or reduced-sodium cheeses.  Fats and Oils Tub margarines without trans  fats. Light or reduced-fat mayonnaise and salad dressings (reduced sodium). Avocado. Safflower, olive, or canola oils. Natural peanut or almond butter.  Other Unsalted popcorn and pretzels. The items listed above may not be a complete list of recommended foods or beverages. Contact your dietitian for more options.  +++++++++++++++  WHAT FOODS ARE NOT RECOMMENDED? Grains/ White flour or wheat flour White bread. White pasta. White rice. Refined cornbread. Bagels and croissants. Crackers that contain trans fat.  Vegetables  Creamed or fried vegetables. Vegetables in a . Regular canned vegetables. Regular canned tomato sauce and paste. Regular tomato and vegetable juices.  Fruits Dried fruits. Canned fruit in light or heavy syrup. Fruit juice.  Meat and Other Protein Products Meat in general - RED meat & White meat.  Fatty cuts of meat. Ribs, chicken wings, all processed meats as bacon, sausage, bologna, salami, fatback, hot dogs, bratwurst and packaged luncheon meats.  Dairy Whole or 2% milk, cream, half-and-half, and cream cheese. Whole-fat or sweetened yogurt. Full-fat cheeses or blue cheese. Non-dairy creamers and whipped toppings. Processed cheese, cheese spreads, or cheese curds.  Condiments Onion and garlic salt, seasoned salt, table salt, and sea salt. Canned and packaged gravies. Worcestershire sauce. Tartar sauce. Barbecue sauce. Teriyaki sauce. Soy sauce, including reduced sodium. Steak sauce. Fish sauce. Oyster sauce. Cocktail sauce. Horseradish. Ketchup and mustard. Meat flavorings and tenderizers. Bouillon cubes. Hot sauce. Tabasco sauce. Marinades. Taco seasonings. Relishes.  Fats and Oils Butter, stick margarine, lard, shortening and bacon fat. Coconut, palm kernel, or palm oils. Regular salad dressings.  Pickles and olives. Salted popcorn and pretzels.  The items listed above may not be a complete list of foods and beverages to   avoid.

## 2017-02-18 ENCOUNTER — Encounter: Payer: Medicare Other | Admitting: Neurology

## 2017-02-18 DIAGNOSIS — Z029 Encounter for administrative examinations, unspecified: Secondary | ICD-10-CM

## 2017-02-18 LAB — LIPID PANEL
CHOLESTEROL: 158 mg/dL (ref <200)
HDL: 69 mg/dL (ref >50)
LDL Cholesterol: 72 mg/dL (ref <100)
TRIGLYCERIDES: 84 mg/dL (ref <150)
Total CHOL/HDL Ratio: 2.3 Ratio (ref <5.0)
VLDL: 17 mg/dL (ref <30)

## 2017-02-18 LAB — CBC WITH DIFFERENTIAL/PLATELET
Basophils Absolute: 88 cells/uL (ref 0–200)
Basophils Relative: 1 %
Eosinophils Absolute: 352 cells/uL (ref 15–500)
Eosinophils Relative: 4 %
HCT: 44.7 % (ref 35.0–45.0)
Hemoglobin: 14.8 g/dL (ref 11.7–15.5)
LYMPHS PCT: 12 %
Lymphs Abs: 1056 cells/uL (ref 850–3900)
MCH: 29.8 pg (ref 27.0–33.0)
MCHC: 33.1 g/dL (ref 32.0–36.0)
MCV: 89.9 fL (ref 80.0–100.0)
MONOS PCT: 6 %
MPV: 10.2 fL (ref 7.5–12.5)
Monocytes Absolute: 528 cells/uL (ref 200–950)
NEUTROS PCT: 77 %
Neutro Abs: 6776 cells/uL (ref 1500–7800)
PLATELETS: 235 10*3/uL (ref 140–400)
RBC: 4.97 MIL/uL (ref 3.80–5.10)
RDW: 14.3 % (ref 11.0–15.0)
WBC: 8.8 10*3/uL (ref 3.8–10.8)

## 2017-02-18 LAB — HEPATIC FUNCTION PANEL
ALBUMIN: 3.6 g/dL (ref 3.6–5.1)
ALT: 17 U/L (ref 6–29)
AST: 21 U/L (ref 10–35)
Alkaline Phosphatase: 47 U/L (ref 33–130)
BILIRUBIN TOTAL: 0.7 mg/dL (ref 0.2–1.2)
Bilirubin, Direct: 0.2 mg/dL (ref <=0.2)
Indirect Bilirubin: 0.5 mg/dL (ref 0.2–1.2)
Total Protein: 6 g/dL — ABNORMAL LOW (ref 6.1–8.1)

## 2017-02-18 LAB — URINALYSIS, ROUTINE W REFLEX MICROSCOPIC
BILIRUBIN URINE: NEGATIVE
Glucose, UA: NEGATIVE
KETONES UR: NEGATIVE
Leukocytes, UA: NEGATIVE
NITRITE: NEGATIVE
Protein, ur: NEGATIVE
Specific Gravity, Urine: 1.015 (ref 1.001–1.035)
pH: 6.5 (ref 5.0–8.0)

## 2017-02-18 LAB — INSULIN, RANDOM: INSULIN: 7.4 u[IU]/mL (ref 2.0–19.6)

## 2017-02-18 LAB — HEMOGLOBIN A1C
Hgb A1c MFr Bld: 5.5 % (ref <5.7)
Mean Plasma Glucose: 111 mg/dL

## 2017-02-18 LAB — BASIC METABOLIC PANEL WITH GFR
BUN: 21 mg/dL (ref 7–25)
CO2: 29 mmol/L (ref 20–31)
Calcium: 9 mg/dL (ref 8.6–10.4)
Chloride: 103 mmol/L (ref 98–110)
Creat: 1 mg/dL — ABNORMAL HIGH (ref 0.60–0.93)
GFR, EST AFRICAN AMERICAN: 62 mL/min (ref >=60)
GFR, Est Non African American: 54 mL/min — ABNORMAL LOW (ref >=60)
Glucose, Bld: 106 mg/dL — ABNORMAL HIGH (ref 65–99)
POTASSIUM: 3.9 mmol/L (ref 3.5–5.3)
Sodium: 143 mmol/L (ref 135–146)

## 2017-02-18 LAB — TSH: TSH: 1.06 mIU/L

## 2017-02-18 LAB — MAGNESIUM: MAGNESIUM: 2.1 mg/dL (ref 1.5–2.5)

## 2017-02-18 LAB — URINALYSIS, MICROSCOPIC ONLY
Bacteria, UA: NONE SEEN [HPF]
Casts: NONE SEEN [LPF]
Crystals: NONE SEEN [HPF]
RBC / HPF: NONE SEEN RBC/HPF (ref <=2)
SQUAMOUS EPITHELIAL / LPF: NONE SEEN [HPF] (ref <=5)
WBC, UA: NONE SEEN WBC/HPF (ref <=5)
YEAST: NONE SEEN [HPF]

## 2017-02-18 LAB — VITAMIN D 25 HYDROXY (VIT D DEFICIENCY, FRACTURES): VIT D 25 HYDROXY: 45 ng/mL (ref 30–100)

## 2017-02-24 ENCOUNTER — Encounter: Payer: Self-pay | Admitting: Pulmonary Disease

## 2017-02-24 ENCOUNTER — Ambulatory Visit (INDEPENDENT_AMBULATORY_CARE_PROVIDER_SITE_OTHER): Payer: Medicare Other | Admitting: Pulmonary Disease

## 2017-02-24 ENCOUNTER — Encounter: Payer: Self-pay | Admitting: Neurology

## 2017-02-24 DIAGNOSIS — J4520 Mild intermittent asthma, uncomplicated: Secondary | ICD-10-CM

## 2017-02-24 DIAGNOSIS — J984 Other disorders of lung: Secondary | ICD-10-CM

## 2017-02-24 DIAGNOSIS — R0989 Other specified symptoms and signs involving the circulatory and respiratory systems: Secondary | ICD-10-CM

## 2017-02-24 MED ORDER — SODIUM CHLORIDE 3 % IN NEBU: INHALATION_SOLUTION | RESPIRATORY_TRACT | 11 refills | Status: DC

## 2017-02-24 NOTE — Progress Notes (Signed)
Subjective:    Patient ID: Marie [name], female    DOB: June 01, 1936, 81 y.o.   MRN: [id number]  Synopsis: Referred in 2018 for evaluation of wheezing, mucus production and dyspnea.  She was noted to have restrictive lung disease and dysphagia.  HPI Chief Complaint  Patient presents with  . Follow-up    pt c/o wheezing, sob, prod cough with yellow mucus- attributes this to pollen.      She is coughing up a lot of yellow mucus lately.  She has been wheezing more.  She never got the hypertonic saline solution because none of the pharmacies carried.   She is supposed to go back on 5/15 to Dr. Posey Pronto for a nerve conduction study.    She is still losing weight.  She has now lost 35 pounds. She still has some trouble swallowing.  She can't swallow medicines well.    She says that her nebulizer twice a day which helps.    Past Medical History:  Diagnosis Date  . Balance disorder 2008.     Falls a lot.  She can be standing and then leans too far over to one side   . Bulging disc   . Chronic bronchitis ([place])    due to congestion at times, on prednisone and advair  . COPD (chronic obstructive pulmonary disease) ([place])   . Depression    many years ago  . GERD (gastroesophageal reflux disease)   . Hearing loss    mild  . Hyperlipidemia   . Hypertension   . Iatrogenic adrenal insufficiency ([place])   . Incontinence    not indicated at this visit.  Marland Kitchen Neuropathy    legs stay numb   . Osteoarthritis    hands,   . Osteoporosis   . Shortness of breath dyspnea   . Tubulovillous adenoma of rectum   . Wears dentures   . Wears glasses       Review of Systems  Constitutional: Positive for fatigue. Negative for chills and fever.  HENT: Negative for rhinorrhea, sinus pain and sinus pressure.   Respiratory: Positive for cough, shortness of breath and wheezing. Negative for choking.   Cardiovascular: Negative for chest pain, palpitations and leg swelling.       Objective:   Physical  Exam Vitals:   02/24/17 1542  BP: 114/62  Pulse: 74  SpO2: 94%  Weight: 145 lb 12.8 oz (66.1 kg)  Height: 5\' 4"  (1.626 m)   Gen: well appearing, no acute distress HENT: NCAT, OP clear, neck supple without masses Eyes: PERRL, EOMi Lymph: no cervical lymphadenopathy PULM: CTA B CV: RRR, no mgr, no JVD GI: BS+, soft, nontender, no hsm Derm: no rash or skin breakdown MSK: markedly diminished bulk hand muscles Neuro: A&Ox4, CN II-XII intact, tremor R hand Psyche: normal mood and affect    Lung function testing: February 2018 for pulmonary function tests: Ratio 83%, FEV1 1.24 L 65% predicted, FVC 1.50 L 58% predicted, some change in small airways with bronchodilator, total lung capacity 3.31 L 67% predicted, ERV 13% predicted, DLCO 10.5 946% predicted  Imaging: CT chest in 2018 images personally reviewed showing no evidence of a pulmonary parenchymal abnormality with the exception of what appears to be mucus plugging in a central left lower lobe airway, but no other surrounding atelectasis or mass.  Other testing: Barium swallow 11/2016 IMPRESSION:1. Markedly delayed oral transit. 2. Premature spill with thin liquid and flash penetration with cough. No aspiration.  Records from neurology reviewed:  next planned step in work up is a nerve conduction study     Assessment & Plan:  Restrictive lung disease She has restrictive lung disease without evidence of a pulmonary parenchymal explanation. Given her trouble swallowing and notable decreased muscular tone on physical exam I worry that this is a neuromuscular problem. I appreciate the neurology team working her up for a neuromuscular weakness.  At this point I don't have a medicine to help fix the problem and we are left with managing the consequences of her dysphagia leading to chest congestion and bronchitis.  Plan: f/u with neurology for nerve conduction study Repeat PFT in August to monitor restrictive lung disease See  below  Chest congestion Add back hypertonic saline to help manage mucociliary clearance. May need cough assist device if symptoms worsen  Asthma No airflow obstruction, only wheeze is upper airway (laryngeal) only and not suggestive of asthma.  OK to continue Advair for now, but I doubt it is adding much.    > 50% of this 28 minute visit spent face to face   Current Outpatient Prescriptions:  .  ADVAIR DISKUS 250-50 MCG/DOSE AEPB, INHALE ONE PUFF BY MOUTH TWICE DAILY, Disp: 60 each, Rfl: 1 .  albuterol (PROVENTIL) (2.5 MG/3ML) 0.083% nebulizer solution, USE ONE VIAL IN NEBULIZER EVERY 6 HOURS AS NEEDED FOR WHEEZING OR SHORTNESS OF BREATH, Disp: 75 mL, Rfl: 4 .  aspirin 81 MG tablet, Take 81 mg by mouth daily. Buys OTC, Disp: , Rfl:  .  benzonatate (TESSALON) 200 MG capsule, Take 1 capsule (200 mg total) by mouth 3 (three) times daily as needed for cough., Disp: 60 capsule, Rfl: 0 .  Cholecalciferol (VITAMIN D) 2000 units CAPS, Take 2,000 Units by mouth daily., Disp: , Rfl:  .  furosemide (LASIX) 20 MG tablet, Take 1 tablet (20 mg total) by mouth daily., Disp: 30 tablet, Rfl: 11 .  guaiFENesin (MUCINEX) 600 MG 12 hr tablet, Take 2 tablets (1,200 mg total) by mouth 2 (two) times daily., Disp: 30 tablet, Rfl: 0 .  losartan-hydrochlorothiazide (HYZAAR) 100-25 MG tablet, Take 0.5 tablets by mouth daily., Disp: , Rfl:  .  nystatin (MYCOSTATIN) 100000 UNIT/ML suspension, Take 5 mLs (500,000 Units total) by mouth 4 (four) times daily., Disp: 240 mL, Rfl: 3 .  omeprazole (PRILOSEC) 40 MG capsule, Take 1 capsule (40 mg total) by mouth 2 (two) times daily., Disp: 60 capsule, Rfl: 4 .  pravastatin (PRAVACHOL) 40 MG tablet, Take 20 mg by mouth daily., Disp: , Rfl:  .  sodium chloride HYPERTONIC 3 % nebulizer solution, Take 1 vial by nebulization BID, Disp: 240 mL, Rfl: 11

## 2017-02-24 NOTE — Assessment & Plan Note (Signed)
Add back hypertonic saline to help manage mucociliary clearance. May need cough assist device if symptoms worsen

## 2017-02-24 NOTE — Patient Instructions (Signed)
We will prescribe the hypertonic saline solution for you to use twice a day. This is intended to help you clear mucus out of your lungs. Please call us if the pharmacy is unable to prescribe this. Keep taking Advair twice a day Follow-up with neurology as prescribed Follow-up with [place] in 3 months, we will get a lung function test at that point.

## 2017-02-24 NOTE — Assessment & Plan Note (Signed)
She has restrictive lung disease without evidence of a pulmonary parenchymal explanation. Given her trouble swallowing and notable decreased muscular tone on physical exam I worry that this is a neuromuscular problem. I appreciate the neurology team working her up for a neuromuscular weakness.  At this point I don't have a medicine to help fix the problem and we are left with managing the consequences of her dysphagia leading to chest congestion and bronchitis.  Plan: f/u with neurology for nerve conduction study Repeat PFT in August to monitor restrictive lung disease See below

## 2017-02-24 NOTE — Assessment & Plan Note (Signed)
No airflow obstruction, only wheeze is upper airway (laryngeal) only and not suggestive of asthma.  OK to continue Advair for now, but I doubt it is adding much.

## 2017-03-02 ENCOUNTER — Ambulatory Visit (INDEPENDENT_AMBULATORY_CARE_PROVIDER_SITE_OTHER): Payer: Medicare Other | Admitting: Neurology

## 2017-03-02 ENCOUNTER — Telehealth: Payer: Self-pay | Admitting: *Deleted

## 2017-03-02 DIAGNOSIS — R1310 Dysphagia, unspecified: Secondary | ICD-10-CM

## 2017-03-02 DIAGNOSIS — R471 Dysarthria and anarthria: Secondary | ICD-10-CM

## 2017-03-02 DIAGNOSIS — R270 Ataxia, unspecified: Secondary | ICD-10-CM

## 2017-03-02 NOTE — Telephone Encounter (Signed)
Refill for [facility] Mouthwash faxed to [facility] per Dr Melford Aase.

## 2017-03-02 NOTE — Procedures (Signed)
[facility]  [place], [place]  [place], [place] [address] Tel: [phone] Fax:  [phone] Test Date:  03/02/2017  Patient: Marie [name] DOB: [birth year] Physician: Narda Amber, DO  Sex: Female Height: 5\' 4"  Ref Phys: Narda Amber, DO  ID#: [id number] Temp: 30.6C Technician:    Patient Complaints: This is a 81 year-old female referred for evaluation of dysphagia and dysarthria.  NCV & EMG Findings: Extensive electrodiagnostic testing of the right upper, lower extremity, midthoracic paraspinal muscles and bulbar muscles shows:  1. Absent right median, ulnar, and sural sensory responses. 2. Right median and ulnar motor responses are within normal limits. Right peroneal motor response at the extensor digitorum brevis is absent, and normal at the tibialis anterior. 3. Repetitive nerve stimulation of the spinal accessory, median, and peroneal nerves recording at the trapezius, abductor pollicis brevis, and tibialis anterior muscles, respectively, is within normal limits. Specifically, there is no evidence of decrement. 4. Chronic motor axon loss changes are seen affecting the distal upper and lower extremity muscles. 5. There is evidence of chronic motor axon loss changes involving the hyoglossus and mentalis muscles. There is no evidence of accompanied active denervation involving any of the tested muscles.   Impression: 1. There is evidence of a length dependent sensorimotor polyneuropathy, axon loss in type, affecting the upper and lower extremities; moderate in degree electrically. 2. Chronic motor axon loss changes seen in the bulbar muscles. In isolation, these findings are of unclear clinical significance. Correlate clinically. 3. There is no evidence of a neuromuscular junction disorder, widespread disorder of anterior horn cells, or cervical/lumbosacral radiculopathy affecting the right side.    ___________________________ Narda Amber, DO    Nerve  Conduction Studies Anti Sensory Summary Table   Site NR Peak (ms) Norm Peak (ms) P-T Amp (V) Norm P-T Amp  Right Median Anti Sensory (2nd Digit)  Wrist NR  <3.8  >10  Right Sural Anti Sensory (Lat [place])  Calf NR  <4.6  >3   Ortho Sensory Summary Table   Site NR Peak (ms) Norm Peak (ms) P-T Amp (V) Norm P-T Amp  Right Ulnar Ortho Sensory (Wrist)  5th Digit NR  <3.5  >5   Motor Summary Table   Site NR Onset (ms) Norm Onset (ms) O-P Amp (mV) Norm O-P Amp Site1 Site2 Delta-0 (ms) Dist (cm) Vel (m/s) Norm Vel (m/s)  Right Median Motor (Abd Poll Brev)  Wrist    3.2 <4.0 6.1 >5 Elbow Wrist 5.7 29.0 51 >50  Elbow    8.9  5.8         Right Peroneal Motor (Ext Dig Brev)  Ankle NR  <6.0  >2.5 B Fib Ankle  0.0  >40  B Fib NR     Poplt B Fib  0.0  >40  Poplt NR            Right Peroneal TA Motor (Tib Ant)  Fib Head    3.4 <4.5 3.8 >3 Poplit Fib Head 1.4 7.0 50 >40  Poplit    4.8  3.7         Right Ulnar Motor (Abd Dig Minimi)  Wrist    2.8 <3.1 9.6 >7 B Elbow Wrist 4.6 23.0 50 >50  B Elbow    7.4  8.6  A Elbow B Elbow 2.1 10.0 48 >50  A Elbow    9.5  7.5          EMG   Side Muscle Ins Act Fibs  Psw Fasc Number Recrt Dur Dur. Amp Amp. Poly Poly. Comment  Right AntTibialis Nml Nml Nml Nml 1- Rapid Some 1+ Some 1+ Nml Nml N/A  Right 1stDorInt Nml Nml Nml Nml 1- Rapid Some 1+ Some 1+ Nml Nml N/A  Right Ext Indicis Nml Nml Nml Nml 1- Rapid Some 1+ Some 1+ Nml Nml N/A  Right Abd Poll Brev Nml Nml Nml Nml 1- Rapid Some 1+ Some 1+ Nml Nml N/A  Right Mentalis Nml Nml Nml Nml 1- Rapid Some 1+ Some 1+ Nml Nml N/A  Right Hyoglossus Nml Nml Nml Nml 1- Rapid Some 1+ Some 1+ Some 1+ N/A  Right Gastroc Nml Nml Nml Nml 2- Rapid Some 1+ Some 1+ Nml Nml N/A  Right GluteusMed Nml Nml Nml Nml Nml Nml Nml Nml Nml Nml Nml Nml N/A  Right Triceps Nml Nml Nml Nml Nml Nml Nml Nml Nml Nml Nml Nml N/A  Right Biceps Nml Nml Nml Nml Nml Nml Nml Nml Nml Nml Nml Nml N/A  Right RectFemoris Nml Nml Nml Nml Nml Nml  Nml Nml Nml Nml Nml Nml N/A  Right BicepsFemS Nml Nml Nml Nml Nml Nml Nml Nml Nml Nml Nml Nml N/A  Right Cervical Parasp Low Nml Nml Nml Nml Nml Nml Nml Nml Nml Nml Nml Nml N/A  Right Thoracic Parasp Up Nml Nml Nml Nml Nml Nml Nml Nml Nml Nml Nml Nml N/A  Right Thoracic Parasp Mid Nml Nml Nml Nml Nml Nml Nml Nml Nml Nml Nml Nml N/A  Right Deltoid Nml Nml Nml Nml Nml Nml Nml Nml Nml Nml Nml Nml N/A  Right PronatorTeres Nml Nml Nml Nml Nml Nml Nml Nml Nml Nml Nml Nml N/A   RNS   Trial # Label Amp 1 (mV)  O-P Amp 5 (mV)  O-P Amp % Dif Area 1 (mVms) Area 5 (mVms) Area % Dif Rep Rate Train Length Pause Time (min:sec) Comments  Right Abd Poll Brev  Tr 1 Baseline 2.15 2.10 -2.3 4.26 4.00 -6.0 3.00 10 00:30   Tr 2 Post Exercise 2.35 2.27 -3.4 4.30 4.19 -2.5 3.00 10 01:00   Tr 3 1 Min Post 2.31 2.37 2.2 4.46 4.52 1.5 3.00 10 01:00   Tr 4 2 Min Post 2.59 2.54 -2.0 5.04 4.80 -4.8 3.00 10 01:00   Tr 5 3 Min Post 2.55 2.52 -1.1 4.91 4.83 -1.7 3.00 10 00:00   Right Trapezius  Tr 1 Baseline 2.99 2.89 -3.2 24.14 22.07 -8.6 3.00 10 00:30   Tr 3 Post Exercise 3.33 3.40 2.0 27.15 25.58 -5.8 3.00 10 01:00   Tr 4 1 Min Post 3.55 3.49 -1.7 27.51 25.33 -7.9 3.00 10 01:00   Tr 5 2 Min Post 3.53 3.50 -0.7 24.86 24.10 -3.1 3.00 10 01:00   Tr 6 3 Min Post 3.55 3.44 -3.2 25.75 23.72 -7.9 3.00 10 00:00   Right AntTibialis  Tr 1 Baseline 3.21 3.28 2.1 23.47 22.88 -2.5 3.00 10 00:30   Tr 2 Post Exercise 2.80 2.89 3.2 18.84 19.78 5.0 3.00 10 01:00   Tr 3 1 Min Post 2.97 2.91 -2.0 20.83 19.88 -4.6 3.00 10 01:00   Tr 4 2 Min Post 3.02 2.96 -2.1 22.10 20.14 -8.9 3.00 10 01:00   Tr 5 3 Min Post 2.74 2.74 -0.2 19.54 19.47 -0.4 3.00 10 00:00              Waveforms:

## 2017-03-03 ENCOUNTER — Telehealth: Payer: Self-pay | Admitting: *Deleted

## 2017-03-03 NOTE — Telephone Encounter (Signed)
I spoke with patient and she will be in tomorrow at 8 am.

## 2017-03-03 NOTE — Telephone Encounter (Signed)
-----   Message from Alda Berthold, DO sent at 03/03/2017 12:43 PM EDT ----- Caryl Pina, see if the patient can come in tomorrow morning at 8am to go over her EMG results. Otherwise, we will need to bring her on another day.  The front will need to schedule her, if she can come in.  Thanks.

## 2017-03-04 ENCOUNTER — Encounter: Payer: Self-pay | Admitting: Neurology

## 2017-03-04 ENCOUNTER — Ambulatory Visit (INDEPENDENT_AMBULATORY_CARE_PROVIDER_SITE_OTHER): Payer: Medicare Other | Admitting: Neurology

## 2017-03-04 VITALS — BP 90/64 | HR 68 | Ht 64.0 in | Wt 145.0 lb

## 2017-03-04 DIAGNOSIS — R1310 Dysphagia, unspecified: Secondary | ICD-10-CM

## 2017-03-04 DIAGNOSIS — G629 Polyneuropathy, unspecified: Secondary | ICD-10-CM | POA: Diagnosis not present

## 2017-03-04 DIAGNOSIS — R471 Dysarthria and anarthria: Secondary | ICD-10-CM | POA: Diagnosis not present

## 2017-03-04 DIAGNOSIS — G232 Striatonigral degeneration: Secondary | ICD-10-CM | POA: Diagnosis not present

## 2017-03-04 DIAGNOSIS — G20C Parkinsonism, unspecified: Secondary | ICD-10-CM | POA: Insufficient documentation

## 2017-03-04 MED ORDER — CARBIDOPA-LEVODOPA 25-100 MG PO TABS: 1.0000 | ORAL_TABLET | Freq: Three times a day (TID) | ORAL | 5 refills | Status: DC

## 2017-03-04 NOTE — Progress Notes (Signed)
Follow-up Visit   Date: 03/04/17    Marie [name] MRN: [id number] DOB: 1936/08/06   Interim History: Marie [name] is a 81 y.o. right-handed Caucasian female with hypertension, hyperlipidemia, COPD, depression, GERD, and neuropathy returning to the clinic for follow-up of dysphagia and dysarthria.  The patient was accompanied to the clinic by husband who also provides collateral information.    History of present illness: Starting in around the fall 2017, she began having difficulty swallowing, which is worse with liquids.  She had difficulty initiating swallowing and had MBS which showed markedly delayed oral transit.  Fortunately, she has not had any choking spells.  There has been 30lb unintentional weight loss. She also complains of difficulty with speech production, but husband has not noticed slurred speech.  There are no exacerbating factors.  There is no diurnal variation to there symptoms.  She does not feel that symptoms are progressing, but she is concerned that they are not improving. She denies double vision, droopy eyelids, or weakness of the arms and legs.  She is short of breath, especially when eating. She sleeps on a wedge for the past few years because of acid reflux. She complains of generalized fatigue for the past 2 years.  She has been using a walker during the same time because of imbalance.  She denies numbness/burning/tingling of the legs.  She saw Dr Jannifer Franklin in 2009 at [facility], but does not recall the reason for her visit.  For her wheezing, has been evaluated by Dr. Lake Bells whose PFTs were suggestive of restrictive pattern and therefore referred to Dr. Redmond Baseman, ENT, to evaluate for upper airway obstruction, which was not present on his exam.  She also has echocardiogram which was notable for grade 1 diastolic dysfunction.  She has been treated for COPD and URI with several courses of antibiotics and prednisone.  Most recently she was admitted on 3/12 - 3/14 for acute  respiratory failure secondary to pneumonia and COPD exacerbation.  She is currently on a prednisone taper.  She denies noticing any benefit with respect to her speech or swallow function while being on prednisone.    UPDATE 03/04/2017:  She is here to discuss her results of EMG and labs.  There was no evidence of a neuromuscular junction disorder, myopathy, or disorder of anterior horn cells on EMG. She has no neuropathy and mild bulbar neurogenic changes. Labs including screening for myopathy, vitamin B12 deficiency, and myasthenia gravis also returned normal.  Overall, she feels that her symptoms of difficulty swallowing and talking stabilized. She has difficulty with swallowing her capsulated medications, but otherwise has no problems with liquids or solids.  She was evaluated by pulmonology who is findings indicated restrictive lung disease; she continues to struggle with chronic cough and wheezing. She has noticed intermittent left hand tremor.   Medications:  Current Outpatient Prescriptions on File Prior to Visit  Medication Sig Dispense Refill  . ADVAIR DISKUS 250-50 MCG/DOSE AEPB INHALE ONE PUFF BY MOUTH TWICE DAILY 60 each 1  . albuterol (PROVENTIL) (2.5 MG/3ML) 0.083% nebulizer solution USE ONE VIAL IN NEBULIZER EVERY 6 HOURS AS NEEDED FOR WHEEZING OR SHORTNESS OF BREATH 75 mL 4  . aspirin 81 MG tablet Take 81 mg by mouth daily. Buys OTC    . benzonatate (TESSALON) 200 MG capsule Take 1 capsule (200 mg total) by mouth 3 (three) times daily as needed for cough. 60 capsule 0  . Cholecalciferol (VITAMIN D) 2000 units CAPS Take 2,000 Units by  mouth daily.    . furosemide (LASIX) 20 MG tablet Take 1 tablet (20 mg total) by mouth daily. 30 tablet 11  . guaiFENesin (MUCINEX) 600 MG 12 hr tablet Take 2 tablets (1,200 mg total) by mouth 2 (two) times daily. 30 tablet 0  . losartan-hydrochlorothiazide (HYZAAR) 100-25 MG tablet Take 0.5 tablets by mouth daily.    Marland Kitchen nystatin (MYCOSTATIN) 100000  UNIT/ML suspension Take 5 mLs (500,000 Units total) by mouth 4 (four) times daily. 240 mL 3  . omeprazole (PRILOSEC) 40 MG capsule Take 1 capsule (40 mg total) by mouth 2 (two) times daily. 60 capsule 4  . pravastatin (PRAVACHOL) 40 MG tablet Take 20 mg by mouth daily.    . sodium chloride HYPERTONIC 3 % nebulizer solution Take 1 vial by nebulization BID 240 mL 11   No current facility-administered medications on file prior to visit.     Allergies:  Allergies  Allergen Reactions  . Ertapenem Hives    Caused whole body to turn red Other reaction(s): Redness Caused whole body to turn red  . Codeine Other (See Comments)    hallucinations  . Demerol Other (See Comments)    hallucinations  . Morphine And Related Other (See Comments)    hallucinations  . Other Other (See Comments)    Invan 7- pt became red all over  . Sulfate Other (See Comments)    unknown    Review of Systems:  CONSTITUTIONAL: No fevers, chills, night sweats, or weight loss.  EYES: No visual changes or eye pain ENT: No hearing changes.  No history of nose bleeds.   RESPIRATORY: +cough, +wheezing and shortness of breath.   CARDIOVASCULAR: Negative for chest pain, and palpitations.   GI: Negative for abdominal discomfort, blood in stools or black stools.  No recent change in bowel habits.   GU:  No history of incontinence.   MUSCLOSKELETAL: +history of joint pain or swelling.  No myalgias.   SKIN: Negative for lesions, rash, and itching.   ENDOCRINE: Negative for cold or heat intolerance, polydipsia or goiter.   PSYCH:  No depression or anxiety symptoms.   NEURO: As Above.   Vital Signs:  BP 90/64   Pulse 68   Ht 5\' 4"  (1.626 m)   Wt 145 lb (65.8 kg)   SpO2 91%   BMI 24.89 kg/m   Neurological Exam: MENTAL STATUS including orientation to time, place, person, recent and remote memory, attention span and concentration, language, and fund of knowledge is normal.  Speech is  slow, mild dysarthria, especially  with guttural sounds.  CRANIAL NERVES: No visual field defects.  Pupils equal round and reactive to light. Normal conjugate, extra-ocular eye movements in all directions of gaze, except mildly restricted upgaze.  No ptosis. Face is symmetric. Facial muscles are 5/5. Palate elevates symmetrically.  Tongue is midline.  MOTOR:  Motor strength is 5/5 in all extremities, except interosseus muscles of the hands which is 4+/5. There is generalized loss of muscle bulk with atrophy of the intrinsic hand muscles. There is a new resting left hand and thumb tremor noted of low amplitude moderate frequency. The tremor dissipates with arms outstretched.  No pronator drift.  Tone is increased in the right upper extremity (0+).    MSRs:  Reflexes are 3+/4 throughout, except 2+/4 at the Achilles.  COORDINATION/GAIT:  Normal finger-to- nose-finger.  There is bradykinesia with reduced amplitude and rate of finger and toe tapping on the left.  Gait appears stooped, ataxic, assisted with  a walker.   Data: Labs 01/01/2017:  CK 36, AChR antibody negative, aldolase 6.3, vitamin B12 503  NCS/EMG of the right side 03/02/2017: 1. There is evidence of a length dependent sensorimotor polyneuropathy, axon loss in type, affecting the upper and lower extremities; moderate in degree electrically. 2. Chronic motor axon loss changes seen in the bulbar muscles. In isolation, these findings are of unclear clinical significance. Correlate clinically. 3. There is no evidence of a neuromuscular junction disorder, widespread disorder of anterior horn cells, or cervical/lumbosacral radiculopathy affecting the right side.   IMPRESSION/PLAN: 1.  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 There was no evidence of a neuromuscular junction disorder, myopathy, or ALS on her  electrodiagnostic testing.  She will be started on a tri al of Sinemet 25/100 half a tablet 3 times a day and titrate to 1 tablet 3 times a day.  She has difficulty swallowing capsulated medications, and I have asked her to discuss with her respective providers to transition her to liquid solution or tablets.  2.  Generalized hyperreflexia, possibly due to cervical canal stenosis.  If she has new arm weakness/parestheisas,  I will obtain MRI cervical spine  3  Idiopathic peripheral neuropathy, which is been long-standing and manifesting with numbness and gait ataxia.  Continue to use rollator.  4.  Cough, wheezing, and restrictive lung disease. This may certainly be related to parkinson-plus syndrome.  Appreciate pulmonology assistance with symptom management.  Return to clinic in 3 months.  The duration of this appointment visit was 40 minutes of face-to-face time with the patient.  Greater than 50% of this time was spent in counseling, explanation of diagnosis, planning of further management, and coordination of care.   Thank you for allowing me to participate in patient's care.  If I can answer any additional questions, I would be pleased to do so.    Sincerely,    Donicia Druck K. Posey Pronto, DO

## 2017-03-04 NOTE — Patient Instructions (Addendum)
Start Carbidopa Levodopa as follows at least 30-min prior to meals:     AM  Afternoon   Evening   Week 1:  1/2 tab  1/2 tab   1/2 tab  Week 2:   1/2 tab  1/2 tab   1 tab  Week 3:  1/2 tab  1 tab   1 tab  Week 4:  1 tab  1 tab   1 tab  *Avoid taking with protein products, such as milk, meat, cheese  *if you develop nausea, take with crackers  Call your primary care doctor to see if you can switch your capsule medication to liquid solution so it's easier to swallow  Return to clinic in 3 months

## 2017-03-09 ENCOUNTER — Encounter (HOSPITAL_COMMUNITY): Payer: Self-pay

## 2017-03-09 ENCOUNTER — Emergency Department (HOSPITAL_COMMUNITY): Payer: Medicare Other

## 2017-03-09 ENCOUNTER — Inpatient Hospital Stay (HOSPITAL_COMMUNITY)
Admission: EM | Admit: 2017-03-09 | Discharge: 2017-03-10 | DRG: 189 | Disposition: A | Discharge status: Home / Self Care | Payer: Medicare Other | Attending: Family Medicine | Admitting: Family Medicine

## 2017-03-09 DIAGNOSIS — F419 Anxiety disorder, unspecified: Secondary | ICD-10-CM | POA: Diagnosis present

## 2017-03-09 DIAGNOSIS — J984 Other disorders of lung: Secondary | ICD-10-CM

## 2017-03-09 DIAGNOSIS — R069 Unspecified abnormalities of breathing: Secondary | ICD-10-CM | POA: Diagnosis not present

## 2017-03-09 DIAGNOSIS — E876 Hypokalemia: Secondary | ICD-10-CM | POA: Diagnosis present

## 2017-03-09 DIAGNOSIS — M810 Age-related osteoporosis without current pathological fracture: Secondary | ICD-10-CM | POA: Diagnosis present

## 2017-03-09 DIAGNOSIS — Z7951 Long term (current) use of inhaled steroids: Secondary | ICD-10-CM | POA: Diagnosis not present

## 2017-03-09 DIAGNOSIS — Z9102 Food additives allergy status: Secondary | ICD-10-CM | POA: Diagnosis not present

## 2017-03-09 DIAGNOSIS — J9601 Acute respiratory failure with hypoxia: Secondary | ICD-10-CM | POA: Diagnosis not present

## 2017-03-09 DIAGNOSIS — R062 Wheezing: Secondary | ICD-10-CM | POA: Diagnosis not present

## 2017-03-09 DIAGNOSIS — Z881 Allergy status to other antibiotic agents status: Secondary | ICD-10-CM | POA: Diagnosis not present

## 2017-03-09 DIAGNOSIS — H9190 Unspecified hearing loss, unspecified ear: Secondary | ICD-10-CM | POA: Diagnosis present

## 2017-03-09 DIAGNOSIS — R0602 Shortness of breath: Secondary | ICD-10-CM | POA: Diagnosis not present

## 2017-03-09 DIAGNOSIS — J209 Acute bronchitis, unspecified: Secondary | ICD-10-CM | POA: Diagnosis present

## 2017-03-09 DIAGNOSIS — K219 Gastro-esophageal reflux disease without esophagitis: Secondary | ICD-10-CM | POA: Diagnosis present

## 2017-03-09 DIAGNOSIS — G20 Parkinson's disease: Secondary | ICD-10-CM | POA: Diagnosis present

## 2017-03-09 DIAGNOSIS — I10 Essential (primary) hypertension: Secondary | ICD-10-CM | POA: Diagnosis not present

## 2017-03-09 DIAGNOSIS — G232 Striatonigral degeneration: Secondary | ICD-10-CM | POA: Diagnosis present

## 2017-03-09 DIAGNOSIS — J441 Chronic obstructive pulmonary disease with (acute) exacerbation: Secondary | ICD-10-CM | POA: Diagnosis present

## 2017-03-09 DIAGNOSIS — E785 Hyperlipidemia, unspecified: Secondary | ICD-10-CM | POA: Diagnosis present

## 2017-03-09 DIAGNOSIS — G20C Parkinsonism, unspecified: Secondary | ICD-10-CM | POA: Diagnosis present

## 2017-03-09 DIAGNOSIS — J440 Chronic obstructive pulmonary disease with acute lower respiratory infection: Secondary | ICD-10-CM | POA: Diagnosis present

## 2017-03-09 DIAGNOSIS — R4702 Dysphasia: Secondary | ICD-10-CM | POA: Diagnosis present

## 2017-03-09 DIAGNOSIS — Z7982 Long term (current) use of aspirin: Secondary | ICD-10-CM

## 2017-03-09 DIAGNOSIS — Z79899 Other long term (current) drug therapy: Secondary | ICD-10-CM | POA: Diagnosis not present

## 2017-03-09 DIAGNOSIS — Z885 Allergy status to narcotic agent status: Secondary | ICD-10-CM

## 2017-03-09 DIAGNOSIS — I4581 Long QT syndrome: Secondary | ICD-10-CM | POA: Diagnosis present

## 2017-03-09 LAB — BASIC METABOLIC PANEL
ANION GAP: 11 (ref 5–15)
BUN: 21 mg/dL — ABNORMAL HIGH (ref 6–20)
CHLORIDE: 104 mmol/L (ref 101–111)
CO2: 27 mmol/L (ref 22–32)
Calcium: 9.3 mg/dL (ref 8.9–10.3)
Creatinine, Ser: 0.97 mg/dL (ref 0.44–1.00)
GFR calc Af Amer: >60 mL/min (ref >60)
GFR, EST NON AFRICAN AMERICAN: 55 mL/min — AB (ref >60)
GLUCOSE: 117 mg/dL — AB (ref 65–99)
POTASSIUM: 3.4 mmol/L — AB (ref 3.5–5.1)
Sodium: 142 mmol/L (ref 135–145)

## 2017-03-09 LAB — CBC WITH DIFFERENTIAL/PLATELET
BASOS ABS: 0.1 10*3/uL (ref 0.0–0.1)
Basophils Relative: 1 %
EOS PCT: 8 %
Eosinophils Absolute: 0.9 10*3/uL — ABNORMAL HIGH (ref 0.0–0.7)
HEMATOCRIT: 48.4 % — AB (ref 36.0–46.0)
HEMOGLOBIN: 15.8 g/dL — AB (ref 12.0–15.0)
LYMPHS PCT: 39 %
Lymphs Abs: 4.1 10*3/uL — ABNORMAL HIGH (ref 0.7–4.0)
MCH: 30.3 pg (ref 26.0–34.0)
MCHC: 32.6 g/dL (ref 30.0–36.0)
MCV: 92.7 fL (ref 78.0–100.0)
Monocytes Absolute: 0.8 10*3/uL (ref 0.1–1.0)
Monocytes Relative: 8 %
NEUTROS ABS: 4.6 10*3/uL (ref 1.7–7.7)
NEUTROS PCT: 44 %
PLATELETS: 255 10*3/uL (ref 150–400)
RBC: 5.22 MIL/uL — AB (ref 3.87–5.11)
RDW: 14.2 % (ref 11.5–15.5)
WBC: 10.5 10*3/uL (ref 4.0–10.5)

## 2017-03-09 LAB — TROPONIN I: Troponin I: <0.03 ng/mL (ref <0.03)

## 2017-03-09 LAB — BRAIN NATRIURETIC PEPTIDE: B Natriuretic Peptide: 109 pg/mL — ABNORMAL HIGH (ref 0.0–100.0)

## 2017-03-09 MED ORDER — ASPIRIN 81 MG PO CHEW
81.0000 mg | CHEWABLE_TABLET | Freq: Every day | ORAL | Status: DC
  Administered 2017-03-09 – 2017-03-10 (×2): 81 mg via ORAL
  Filled 2017-03-09 (×4): qty 1

## 2017-03-09 MED ORDER — PRAVASTATIN SODIUM 10 MG PO TABS
20.0000 mg | ORAL_TABLET | Freq: Every day | ORAL | Status: DC
Start: 2017-03-09 — End: 2017-03-10
  Administered 2017-03-09 – 2017-03-10 (×2): 20 mg via ORAL
  Filled 2017-03-09: qty 2

## 2017-03-09 MED ORDER — ENOXAPARIN SODIUM 40 MG/0.4ML ~~LOC~~ SOLN
40.0000 mg | SUBCUTANEOUS | Status: DC
  Administered 2017-03-09: 40 mg via SUBCUTANEOUS
  Filled 2017-03-09: qty 0.4

## 2017-03-09 MED ORDER — ALBUTEROL (5 MG/ML) CONTINUOUS INHALATION SOLN
10.0000 mg/h | INHALATION_SOLUTION | Freq: Once | RESPIRATORY_TRACT | Status: AC
  Administered 2017-03-09: 10 mg/h via RESPIRATORY_TRACT
  Filled 2017-03-09: qty 20

## 2017-03-09 MED ORDER — AZITHROMYCIN 250 MG PO TABS
500.0000 mg | ORAL_TABLET | Freq: Every day | ORAL | Status: DC
Start: 2017-03-09 — End: 2017-03-09

## 2017-03-09 MED ORDER — NYSTATIN 100000 UNIT/ML MT SUSP
5.0000 mL | Freq: Two times a day (BID) | OROMUCOSAL | Status: DC
  Administered 2017-03-09 – 2017-03-10 (×2): 500000 [IU] via ORAL
  Filled 2017-03-09 (×2): qty 5

## 2017-03-09 MED ORDER — POTASSIUM CHLORIDE CRYS ER 20 MEQ PO TBCR
40.0000 meq | EXTENDED_RELEASE_TABLET | Freq: Once | ORAL | Status: AC
  Administered 2017-03-09: 40 meq via ORAL
  Filled 2017-03-09: qty 2

## 2017-03-09 MED ORDER — CARBIDOPA-LEVODOPA 25-100 MG PO TABS
1.0000 | ORAL_TABLET | Freq: Three times a day (TID) | ORAL | Status: DC
  Administered 2017-03-09 – 2017-03-10 (×3): 1 via ORAL
  Filled 2017-03-09 (×2): qty 1

## 2017-03-09 MED ORDER — ACETAMINOPHEN 325 MG PO TABS: 650.0000 mg | ORAL_TABLET | Freq: Four times a day (QID) | ORAL | Status: DC | PRN

## 2017-03-09 MED ORDER — ACETAMINOPHEN 650 MG RE SUPP: 650.0000 mg | Freq: Four times a day (QID) | RECTAL | Status: DC | PRN

## 2017-03-09 MED ORDER — ALBUTEROL SULFATE (2.5 MG/3ML) 0.083% IN NEBU: 2.5000 mg | INHALATION_SOLUTION | Freq: Four times a day (QID) | RESPIRATORY_TRACT | Status: DC | PRN

## 2017-03-09 MED ORDER — BENZONATATE 100 MG PO CAPS
200.0000 mg | ORAL_CAPSULE | Freq: Three times a day (TID) | ORAL | Status: DC | PRN
  Filled 2017-03-09: qty 2

## 2017-03-09 MED ORDER — ONDANSETRON HCL 4 MG/2ML IJ SOLN: 4.0000 mg | Freq: Four times a day (QID) | INTRAMUSCULAR | Status: DC | PRN

## 2017-03-09 MED ORDER — SODIUM CHLORIDE 0.9% FLUSH
3.0000 mL | Freq: Two times a day (BID) | INTRAVENOUS | Status: DC
  Administered 2017-03-09 – 2017-03-10 (×2): 3 mL via INTRAVENOUS

## 2017-03-09 MED ORDER — PANTOPRAZOLE SODIUM 40 MG PO TBEC
40.0000 mg | DELAYED_RELEASE_TABLET | Freq: Every day | ORAL | Status: DC
  Administered 2017-03-09 – 2017-03-10 (×2): 40 mg via ORAL
  Filled 2017-03-09 (×2): qty 1

## 2017-03-09 MED ORDER — MONTELUKAST SODIUM 10 MG PO TABS
10.0000 mg | ORAL_TABLET | Freq: Every day | ORAL | Status: DC
  Administered 2017-03-09: 10 mg via ORAL
  Filled 2017-03-09: qty 1

## 2017-03-09 MED ORDER — FUROSEMIDE 40 MG PO TABS
40.0000 mg | ORAL_TABLET | Freq: Every day | ORAL | Status: DC
  Administered 2017-03-09 – 2017-03-10 (×2): 40 mg via ORAL
  Filled 2017-03-09 (×2): qty 1

## 2017-03-09 MED ORDER — SODIUM CHLORIDE 0.9 % IV SOLN: 250.0000 mL | INTRAVENOUS | Status: DC | PRN

## 2017-03-09 MED ORDER — IPRATROPIUM BROMIDE 0.02 % IN SOLN
1.0000 mg | Freq: Once | RESPIRATORY_TRACT | Status: AC
  Administered 2017-03-09: 1 mg via RESPIRATORY_TRACT
  Filled 2017-03-09: qty 5

## 2017-03-09 MED ORDER — SODIUM CHLORIDE 3 % IN NEBU
4.0000 mL | INHALATION_SOLUTION | Freq: Two times a day (BID) | RESPIRATORY_TRACT | Status: DC
  Administered 2017-03-09 – 2017-03-10 (×2): 4 mL via RESPIRATORY_TRACT
  Filled 2017-03-09 (×2): qty 4

## 2017-03-09 MED ORDER — HYDROCHLOROTHIAZIDE 12.5 MG PO CAPS
12.5000 mg | ORAL_CAPSULE | Freq: Every day | ORAL | Status: DC
  Administered 2017-03-09 – 2017-03-10 (×2): 12.5 mg via ORAL
  Filled 2017-03-09 (×2): qty 1

## 2017-03-09 MED ORDER — ONDANSETRON HCL 4 MG PO TABS: 4.0000 mg | ORAL_TABLET | Freq: Four times a day (QID) | ORAL | Status: DC | PRN

## 2017-03-09 MED ORDER — TRAZODONE HCL 50 MG PO TABS
25.0000 mg | ORAL_TABLET | Freq: Every day | ORAL | Status: DC
  Administered 2017-03-09: 25 mg via ORAL
  Filled 2017-03-09: qty 1

## 2017-03-09 MED ORDER — LOSARTAN POTASSIUM 50 MG PO TABS
50.0000 mg | ORAL_TABLET | Freq: Every day | ORAL | Status: DC
  Administered 2017-03-09 – 2017-03-10 (×2): 50 mg via ORAL
  Filled 2017-03-09 (×2): qty 1

## 2017-03-09 MED ORDER — IPRATROPIUM-ALBUTEROL 0.5-2.5 (3) MG/3ML IN SOLN
3.0000 mL | Freq: Four times a day (QID) | RESPIRATORY_TRACT | Status: DC
  Administered 2017-03-09 – 2017-03-10 (×3): 3 mL via RESPIRATORY_TRACT
  Filled 2017-03-09 (×2): qty 3

## 2017-03-09 MED ORDER — METHYLPREDNISOLONE SODIUM SUCC 125 MG IJ SOLR
60.0000 mg | Freq: Four times a day (QID) | INTRAMUSCULAR | Status: DC
  Administered 2017-03-09 – 2017-03-10 (×3): 60 mg via INTRAVENOUS
  Filled 2017-03-09 (×3): qty 2

## 2017-03-09 MED ORDER — DOXYCYCLINE HYCLATE 100 MG PO TABS
100.0000 mg | ORAL_TABLET | Freq: Two times a day (BID) | ORAL | Status: DC
  Administered 2017-03-09 – 2017-03-10 (×2): 100 mg via ORAL
  Filled 2017-03-09 (×2): qty 1

## 2017-03-09 MED ORDER — SODIUM CHLORIDE 0.9% FLUSH: 3.0000 mL | INTRAVENOUS | Status: DC | PRN

## 2017-03-09 MED ORDER — DM-GUAIFENESIN ER 30-600 MG PO TB12
1.0000 | ORAL_TABLET | Freq: Two times a day (BID) | ORAL | Status: DC
  Administered 2017-03-09 – 2017-03-10 (×2): 1 via ORAL
  Filled 2017-03-09 (×2): qty 1

## 2017-03-09 MED ORDER — LOSARTAN POTASSIUM-HCTZ 100-25 MG PO TABS: 0.5000 | ORAL_TABLET | Freq: Every day | ORAL | Status: DC

## 2017-03-09 MED ORDER — MOMETASONE FURO-FORMOTEROL FUM 200-5 MCG/ACT IN AERO
2.0000 | INHALATION_SPRAY | Freq: Two times a day (BID) | RESPIRATORY_TRACT | Status: DC
  Administered 2017-03-09 – 2017-03-10 (×2): 2 via RESPIRATORY_TRACT
  Filled 2017-03-09: qty 8.8

## 2017-03-09 MED ORDER — VITAMIN D 1000 UNITS PO TABS
2000.0000 [IU] | ORAL_TABLET | Freq: Every day | ORAL | Status: DC
  Administered 2017-03-09 – 2017-03-10 (×2): 2000 [IU] via ORAL
  Filled 2017-03-09 (×4): qty 2

## 2017-03-09 NOTE — ED Notes (Signed)
PT pulse ox drops to 86% while sleeping.

## 2017-03-09 NOTE — ED Provider Notes (Signed)
[facility] DEPT Provider Note   CSN: [id number] Arrival date & time: 03/09/17  0818     History   Chief Complaint Chief Complaint  Patient presents with  . Shortness of Breath    HPI Marie [name] is a 81 y.o. female.  HPI Pt was seen at [facility].  Per EMS and pt, c/o gradual onset and worsening of persistent cough, wheezing and SOB since yesterday. Has been using home MDI and nebs without relief. EMS gave IV solumedrol and short neb en route.  Denies CP/palpitations, no back pain, no abd pain, no N/V/D, no fevers, no rash.    Past Medical History:  Diagnosis Date  . Balance disorder 2008.     Falls a lot.  She can be standing and then leans too far over to one side   . Bulging disc   . Chronic bronchitis ([place])    due to congestion at times, on prednisone and advair  . COPD (chronic obstructive pulmonary disease) ([place])   . Depression    many years ago  . GERD (gastroesophageal reflux disease)   . Hearing loss    mild  . Hyperlipidemia   . Hypertension   . Iatrogenic adrenal insufficiency ([place])   . Incontinence    not indicated at this visit.  Marland Kitchen Neuropathy    legs stay numb   . Osteoarthritis    hands,   . Osteoporosis   . Shortness of breath dyspnea   . Tubulovillous adenoma of rectum   . Wears dentures   . Wears glasses     Patient Active Problem List   Diagnosis Date Noted  . Parkinson's plus syndrome ([place]) 03/04/2017  . Restrictive lung disease 02/24/2017  . Chest congestion 02/24/2017  . Dysphagia 01/12/2017  . Murmur 01/07/2017  . LVH (left ventricular hypertrophy) 01/07/2017  . Acute respiratory failure ([place]) 12/28/2016  . Lobar pneumonia ([place]) 12/28/2016  . Nonspecific abnormal electrocardiogram (ECG) (EKG) 12/28/2016  . Hypokalemia 12/28/2016  . Essential hypertension 12/28/2016  . Depression, major, in remission ([place]) 09/22/2016  . At high risk for falls 10/16/2015  . Hypotension   . Asthma 11/12/2014  . Prediabetes 08/23/2014  .  Vitamin D deficiency 08/23/2014  . Medication management 08/23/2014  . Unstable Gait 01/30/2014  . Incontinence   . Osteoarthritis   . Hyperlipidemia   . GERD (gastroesophageal reflux disease)   . Hereditary and idiopathic peripheral neuropathy   . Iatrogenic adrenal insufficiency ([place])   . Tubulovillous adenoma of rectum 09/04/2011    Past Surgical History:  Procedure Laterality Date  . ABDOMINAL HYSTERECTOMY    . APPENDECTOMY    . CHONDROPLASTY  09/19/2014   Procedure: CHONDROPLASTY;  Surgeon: Alta Corning, MD;  Location: [facility];  Service: Orthopedics;;  . DILATION AND CURETTAGE OF UTERUS    . EXTERNAL FIXATION LEG  10/25/2012   Procedure: EXTERNAL FIXATION LEG;  Surgeon: Rozanna Box, MD;  Location: [facility];  Service: Orthopedics;  Laterality: Right;  . EYE SURGERY     bilateral cataract surgery and lens implant  . FINGER SURGERY     fusions and debridements for OA  . INCONTINENCE SURGERY     multiple procedures, not cured  . KNEE ARTHROSCOPY WITH LATERAL MENISECTOMY Right 09/19/2014   Procedure: KNEE ARTHROSCOPY WITH LATERAL MENISECTOMY;  Surgeon: Alta Corning, MD;  Location: [facility];  Service: Orthopedics;  Laterality: Right;  . KNEE ARTHROSCOPY WITH MEDIAL MENISECTOMY Right 09/19/2014   Procedure: RIGHT KNEE  ARTHROSCOPY WITH MEDIAL AND LATERAL MENISECTOMIES. CHONDROPLASTY OF PATELLA-FEMORAL JOINT;  Surgeon: Alta Corning, MD;  Location: [facility];  Service: Orthopedics;  Laterality: Right;  . RECTAL BIOPSY  09/21/2011   Procedure: BIOPSY RECTAL;  Surgeon: Merrie Roof, MD;  Location: [facility];  Service: General;  Laterality: N/A;  3-4 cm  . RECTAL SURGERY     by dr. Marlou Starks, removal of polyp  . TONSILLECTOMY      OB History    No data available       Home Medications    Prior to Admission medications   Medication Sig Start Date End Date Taking? Authorizing Provider  ADVAIR DISKUS 250-50 MCG/DOSE AEPB INHALE ONE PUFF  BY MOUTH TWICE DAILY 10/08/16  Yes [name], MD  albuterol (PROVENTIL) (2.5 MG/3ML) 0.083% nebulizer solution USE ONE VIAL IN NEBULIZER EVERY 6 HOURS AS NEEDED FOR WHEEZING OR SHORTNESS OF BREATH 11/12/16  Yes Vicie Mutters, PA-C  aspirin 81 MG tablet Take 81 mg by mouth daily. Buys OTC   Yes [provider]  benzonatate (TESSALON) 200 MG capsule Take 1 capsule (200 mg total) by mouth 3 (three) times daily as needed for cough. 01/19/17  Yes Forcucci, Courtney, PA-C  carbidopa-levodopa (SINEMET IR) 25-100 MG tablet Take 1 tablet by mouth 3 (three) times daily. 03/04/17  Yes Patel, Donika K, DO  Cholecalciferol (VITAMIN D) 2000 units CAPS Take 2,000 Units by mouth daily.   Yes [provider]  furosemide (LASIX) 40 MG tablet Take 40 mg by mouth daily.   Yes [provider]  guaiFENesin (MUCINEX) 600 MG 12 hr tablet Take 2 tablets (1,200 mg total) by mouth 2 (two) times daily. 12/30/16  Yes Rai, Ripudeep K, MD  losartan-hydrochlorothiazide (HYZAAR) 100-25 MG tablet Take 0.5 tablets by mouth daily.    Yes [provider]  montelukast (SINGULAIR) 10 MG tablet Take 10 mg by mouth at bedtime.   Yes [provider]  nystatin (MYCOSTATIN) 100000 UNIT/ML suspension Take 5 mLs (500,000 Units total) by mouth 4 (four) times daily. Patient taking differently: Take 5 mLs by mouth 2 (two) times daily.  12/30/16  Yes Rai, Ripudeep K, MD  omeprazole (PRILOSEC) 40 MG capsule Take 1 capsule (40 mg total) by mouth 2 (two) times daily. 12/30/16  Yes Rai, Ripudeep K, MD  pravastatin (PRAVACHOL) 40 MG tablet Take 20 mg by mouth daily.   Yes [provider]  sodium chloride HYPERTONIC 3 % nebulizer solution Take 1 vial by nebulization BID 02/24/17  Yes Juanito Doom, MD  traZODone (DESYREL) 50 MG tablet Take 25 mg by mouth at bedtime.   Yes [provider]    Family History Family History  Problem Relation Age of Onset  . Cancer Sister        pt  unaware of what kind  . High blood pressure Mother   . COPD Mother   . Heart attack Father     Social History Social History  Substance Use Topics  . Smoking status: Never Smoker  . Smokeless tobacco: Never Used  . Alcohol use No     Allergies   Ertapenem; Codeine; Demerol; Morphine and related; Other; and Sulfate   Review of Systems Review of Systems ROS: Statement: All systems negative except as marked or noted in the HPI; Constitutional: Negative for fever and chills. ; ; Eyes: Negative for eye pain, redness and discharge. ; ; ENMT: Negative for ear pain, hoarseness, nasal congestion, sinus pressure and  sore throat. ; ; Cardiovascular: Negative for chest pain, palpitations, diaphoresis, and peripheral edema. ; ; Respiratory: +cough, wheezing, SOB. Negative for stridor. ; ; Gastrointestinal: Negative for nausea, vomiting, diarrhea, abdominal pain, blood in stool, hematemesis, jaundice and rectal bleeding. . ; ; Genitourinary: Negative for dysuria, flank pain and hematuria. ; ; Musculoskeletal: Negative for back pain and neck pain. Negative for swelling and trauma.; ; Skin: Negative for pruritus, rash, abrasions, blisters, bruising and skin lesion.; ; Neuro: Negative for headache, lightheadedness and neck stiffness. Negative for weakness, altered level of consciousness, altered mental status, extremity weakness, paresthesias, involuntary movement, seizure and syncope.       Physical Exam Updated Vital Signs Ht 5\' 4"  (1.626 m)   Wt 65.8 kg (145 lb)   SpO2 95%   BMI 24.89 kg/m   Physical Exam 0830: Physical examination:  Nursing notes reviewed; Vital signs and O2 SAT reviewed;  Constitutional: Well developed, Well nourished, Well hydrated, Uncomfortable appearing.;; Head:  Normocephalic, atraumatic; Eyes: EOMI, PERRL, No scleral icterus; ENMT: Mouth and pharynx normal, Mucous membranes moist; Neck: Supple, Full range of motion, No lymphadenopathy; Cardiovascular: Tachycardic rate  and rhythm, No gallop; Respiratory: Breath sounds diminished & equal bilaterally, insp/exp wheezes bilat. Faint audible wheezing.  Speaking short sentences, sitting upright, tachypneic.; Chest: Nontender, Movement normal; Abdomen: Soft, Nontender, Nondistended, Normal bowel sounds; Genitourinary: No CVA tenderness; Extremities: Pulses normal, No tenderness, No edema, No calf edema or asymmetry.; Neuro: AA&Ox3, Major CN grossly intact.  Speech clear. No gross focal motor or sensory deficits in extremities.; Skin: Color normal, Warm, Dry.   ED Treatments / Results  Labs (all labs ordered are listed, but only abnormal results are displayed)   EKG  EKG Interpretation  Date/Time:  Tuesday Mar 09 2017 08:30:26 EDT Ventricular Rate:  103 PR Interval:    QRS Duration: 97 QT Interval:  382 QTC Calculation: 501 R Axis:   39 Text Interpretation:  Sinus tachycardia Nonspecific ST and T wave abnormality Prolonged QT interval Artifact Baseline wander When compared with ECG of 12/29/2016 Rate faster Confirmed by [facility]  MD, Nunzio Cory [id number]) on 03/09/2017 8:54:16 AM       Radiology   Procedures Procedures (including critical care time)  Medications Ordered in ED Medications  albuterol (PROVENTIL,VENTOLIN) solution continuous neb (10 mg/hr Nebulization Given 03/09/17 0852)  ipratropium (ATROVENT) nebulizer solution 1 mg (1 mg Nebulization Given 03/09/17 0347)     Initial Impression / Assessment and Plan / ED Course  I have reviewed the triage vital signs and the nursing notes.  Pertinent labs & imaging results that were available during my care of the patient were reviewed by me and considered in my medical decision making (see chart for details).  MDM Reviewed: previous chart, nursing note and vitals Reviewed previous: labs and ECG Interpretation: labs, ECG and x-ray Total time providing critical care: 30-74 minutes. This excludes time spent performing separately reportable procedures and  services. Consults: admitting MD   CRITICAL CARE Performed by: Alfonzo Feller Total critical care time: 35 minutes Critical care time was exclusive of separately billable procedures and treating other patients. Critical care was necessary to treat or prevent imminent or life-threatening deterioration. Critical care was time spent personally by me on the following activities: development of treatment plan with patient and/or surrogate as well as nursing, discussions with consultants, evaluation of patient's response to treatment, examination of patient, obtaining history from patient or surrogate, ordering and performing treatments and interventions, ordering and review of laboratory studies, ordering and review  of radiographic studies, pulse oximetry and re-evaluation of patient's condition.   Results for orders placed or performed during the hospital encounter of 03/09/17  CBC with Differential  Result Value Ref Range   WBC 10.5 4.0 - 10.5 K/uL   RBC 5.22 (H) 3.87 - 5.11 MIL/uL   Hemoglobin 15.8 (H) 12.0 - 15.0 g/dL   HCT 48.4 (H) 36.0 - 46.0 %   MCV 92.7 78.0 - 100.0 fL   MCH 30.3 26.0 - 34.0 pg   MCHC 32.6 30.0 - 36.0 g/dL   RDW 14.2 11.5 - 15.5 %   Platelets 255 150 - 400 K/uL   Neutrophils Relative % 44 %   Neutro Abs 4.6 1.7 - 7.7 K/uL   Lymphocytes Relative 39 %   Lymphs Abs 4.1 (H) 0.7 - 4.0 K/uL   Monocytes Relative 8 %   Monocytes Absolute 0.8 0.1 - 1.0 K/uL   Eosinophils Relative 8 %   Eosinophils Absolute 0.9 (H) 0.0 - 0.7 K/uL   Basophils Relative 1 %   Basophils Absolute 0.1 0.0 - 0.1 K/uL  Basic metabolic panel  Result Value Ref Range   Sodium 142 135 - 145 mmol/L   Potassium 3.4 (L) 3.5 - 5.1 mmol/L   Chloride 104 101 - 111 mmol/L   CO2 27 22 - 32 mmol/L   Glucose, Bld 117 (H) 65 - 99 mg/dL   BUN 21 (H) 6 - 20 mg/dL   Creatinine, Ser 0.97 0.44 - 1.00 mg/dL   Calcium 9.3 8.9 - 10.3 mg/dL   GFR calc non Af Amer 55 (L) >60 mL/min   GFR calc Af Amer >60  >60 mL/min   Anion gap 11 5 - 15  Troponin I  Result Value Ref Range   Troponin I <0.03 <0.03 ng/mL   Dg Chest Portable 1 View Result Date: 03/09/2017 CLINICAL DATA:  81 year old female with shortness of breath and wheezing since yesterday. Initial encounter. EXAM: PORTABLE CHEST 1 VIEW COMPARISON:  12/28/2016 and 11/16/2016 chest x-ray. 11/19/2016 chest CT. FINDINGS: Pulmonary vascular congestion. Minimal peribronchial thickening without segmental consolidation. No pneumothorax. Heart size within normal limits. Calcified aorta. IMPRESSION: Pulmonary vascular congestion. Minimal peribronchial thickening probably related to degree of pulmonary vascular congestion rather than bronchitis. No segmental consolidation. Aortic atherosclerosis. Electronically Signed   By: Genia Del M.D.   On: 03/09/2017 09:02    1140:  On arrival: pt sitting upright, tachypneic, tachycardic, Sats 96 % on O2 2L N/C, lungs diminished with wheezing. IV solumedrol given by EMS PTA, and hour long neb started. After neb: pt appears more comfortable at rest, less tachypneic, Sats 100 % on O2 2L N/C, lungs continue diminished. O2 N/C removed, pt fell asleep, and her O2 Sat dropped to 86% R/A. Pt ambulated in hallway: pt's O2 Sats remained 90-92% % R/A with pt c/o increasing SOB, with increasing HR and RR. Pt escorted back to stretcher with O2 Sats slowly increasing to 94 % on R/A. Dx and testing d/w pt and family.  Questions answered.  Verb understanding, agreeable to admit. T/C to [facility]. Roderic Palau, case discussed, including:  HPI, pertinent PM/SHx, VS/PE, dx testing, ED course and treatment:  Agreeable to admit.     Final Clinical Impressions(s) / ED Diagnoses   Final diagnoses:  None    New Prescriptions New Prescriptions   No medications on file     Francine Graven, DO 03/10/17 0076

## 2017-03-09 NOTE — ED Notes (Signed)
Report given to Magda Paganini, RN for room 326.

## 2017-03-09 NOTE — H&P (Signed)
History and Physical    [name] FBP:[id number] DOB: [birth year] DOA: 03/09/2017  PCP: [name], MD  Patient coming from: home  I have personally briefly reviewed patient's old medical records in [place]  Chief Complaint: shortness of breath  HPI: Marie [name] is a 81 y.o. female with medical history significant of restrictive lung disease followed by pulmonology, dysphagia and recurrent bronchitis. She felt that she was in her usual state of health on Sunday when she began developing wheezes. She took nebulizer with some improvement. The following day, she was anemic recently short of breath, wheezing, productive cough of yellow colored sputum. Did not have any fever. No chest pain. Her symptoms progressed leading her to the emergency room.  ED Course: Patient was noted to be hypoxic on room air, down to 86%. She received steroids on route to the emergency room and continued nebulizer treatments with some improvement of her breathing. Labs were relatively unremarkable. Chest x-ray showed possible pulmonary vascular congestion. She's been referred for admission.  Review of Systems: As per HPI otherwise 10 point review of systems negative.    Past Medical History:  Diagnosis Date  . Balance disorder 2008.     Falls a lot.  She can be standing and then leans too far over to one side   . Bulging disc   . Chronic bronchitis ([place])    due to congestion at times, on prednisone and advair  . COPD (chronic obstructive pulmonary disease) ([place])   . Depression    many years ago  . GERD (gastroesophageal reflux disease)   . Hearing loss    mild  . Hyperlipidemia   . Hypertension   . Iatrogenic adrenal insufficiency ([place])   . Incontinence    not indicated at this visit.  Marland Kitchen Neuropathy    legs stay numb   . Osteoarthritis    hands,   . Osteoporosis   . Shortness of breath dyspnea   . Tubulovillous adenoma of rectum   . Wears dentures   . Wears glasses      Past Surgical History:  Procedure Laterality Date  . ABDOMINAL HYSTERECTOMY    . APPENDECTOMY    . CHONDROPLASTY  09/19/2014   Procedure: CHONDROPLASTY;  Surgeon: Alta Corning, MD;  Location: [facility];  Service: Orthopedics;;  . DILATION AND CURETTAGE OF UTERUS    . EXTERNAL FIXATION LEG  10/25/2012   Procedure: EXTERNAL FIXATION LEG;  Surgeon: Rozanna Box, MD;  Location: [facility];  Service: Orthopedics;  Laterality: Right;  . EYE SURGERY     bilateral cataract surgery and lens implant  . FINGER SURGERY     fusions and debridements for OA  . INCONTINENCE SURGERY     multiple procedures, not cured  . KNEE ARTHROSCOPY WITH LATERAL MENISECTOMY Right 09/19/2014   Procedure: KNEE ARTHROSCOPY WITH LATERAL MENISECTOMY;  Surgeon: Alta Corning, MD;  Location: [facility];  Service: Orthopedics;  Laterality: Right;  . KNEE ARTHROSCOPY WITH MEDIAL MENISECTOMY Right 09/19/2014   Procedure: RIGHT KNEE ARTHROSCOPY WITH MEDIAL AND LATERAL MENISECTOMIES. CHONDROPLASTY OF PATELLA-FEMORAL JOINT;  Surgeon: Alta Corning, MD;  Location: [facility];  Service: Orthopedics;  Laterality: Right;  . RECTAL BIOPSY  09/21/2011   Procedure: BIOPSY RECTAL;  Surgeon: Merrie Roof, MD;  Location: [facility];  Service: General;  Laterality: N/A;  3-4 cm  . RECTAL SURGERY     by dr. Marlou Starks, removal of polyp  .  TONSILLECTOMY       reports that she has never smoked. She has never used smokeless tobacco. She reports that she does not drink alcohol or use drugs.  Allergies  Allergen Reactions  . Ertapenem Hives    Caused whole body to turn red Other reaction(s): Redness Caused whole body to turn red  . Codeine Other (See Comments)    hallucinations  . Demerol Other (See Comments)    hallucinations  . Morphine And Related Other (See Comments)    hallucinations  . Other Other (See Comments)    Invan 7- pt became red all over  . Sulfate Other (See Comments)    unknown     Family History  Problem Relation Age of Onset  . Cancer Sister        pt unaware of what kind  . High blood pressure Mother   . COPD Mother   . Heart attack Father      Prior to Admission medications   Medication Sig Start Date End Date Taking? Authorizing Provider  ADVAIR DISKUS 250-50 MCG/DOSE AEPB INHALE ONE PUFF BY MOUTH TWICE DAILY 10/08/16  Yes [name], MD  albuterol (PROVENTIL) (2.5 MG/3ML) 0.083% nebulizer solution USE ONE VIAL IN NEBULIZER EVERY 6 HOURS AS NEEDED FOR WHEEZING OR SHORTNESS OF BREATH 11/12/16  Yes Vicie Mutters, PA-C  aspirin 81 MG tablet Take 81 mg by mouth daily. Buys OTC   Yes [provider]  benzonatate (TESSALON) 200 MG capsule Take 1 capsule (200 mg total) by mouth 3 (three) times daily as needed for cough. 01/19/17  Yes Forcucci, Courtney, PA-C  carbidopa-levodopa (SINEMET IR) 25-100 MG tablet Take 1 tablet by mouth 3 (three) times daily. 03/04/17  Yes Patel, Donika K, DO  Cholecalciferol (VITAMIN D) 2000 units CAPS Take 2,000 Units by mouth daily.   Yes [provider]  furosemide (LASIX) 40 MG tablet Take 40 mg by mouth daily.   Yes [provider]  guaiFENesin (MUCINEX) 600 MG 12 hr tablet Take 2 tablets (1,200 mg total) by mouth 2 (two) times daily. 12/30/16  Yes Rai, Ripudeep K, MD  losartan-hydrochlorothiazide (HYZAAR) 100-25 MG tablet Take 0.5 tablets by mouth daily.    Yes [provider]  montelukast (SINGULAIR) 10 MG tablet Take 10 mg by mouth at bedtime.   Yes [provider]  nystatin (MYCOSTATIN) 100000 UNIT/ML suspension Take 5 mLs (500,000 Units total) by mouth 4 (four) times daily. Patient taking differently: Take 5 mLs by mouth 2 (two) times daily.  12/30/16  Yes Rai, Ripudeep K, MD  omeprazole (PRILOSEC) 40 MG capsule Take 1 capsule (40 mg total) by mouth 2 (two) times daily. 12/30/16  Yes Rai, Ripudeep K, MD  pravastatin (PRAVACHOL) 40 MG tablet Take 20 mg by mouth daily.   Yes [provider]  sodium chloride HYPERTONIC 3 % nebulizer solution Take 1 vial by nebulization BID 02/24/17  Yes Juanito Doom, MD  traZODone (DESYREL) 50 MG tablet Take 25 mg by mouth at bedtime.   Yes [provider]    Physical Exam: Vitals:   03/09/17 1120 03/09/17 1202 03/09/17 1203 03/09/17 1257  BP:  112/70 (!) 123/53 117/70  Pulse: (!) 103 94 92 95  Resp:  18 16 17   Temp:   98.2 F (36.8 C) 99.5 F (37.5 C)  TempSrc:    Oral  SpO2: 94% 91% 94% 94%  Weight:    65.8 kg (145 lb)  Height:    5\' 4"  (1.626  m)    Constitutional: NAD, calm, comfortable Vitals:   03/09/17 1120 03/09/17 1202 03/09/17 1203 03/09/17 1257  BP:  112/70 (!) 123/53 117/70  Pulse: (!) 103 94 92 95  Resp:  18 16 17   Temp:   98.2 F (36.8 C) 99.5 F (37.5 C)  TempSrc:    Oral  SpO2: 94% 91% 94% 94%  Weight:    65.8 kg (145 lb)  Height:    5\' 4"  (1.626 m)   Eyes: PERRL, lids and conjunctivae normal ENMT: Mucous membranes are moist. Posterior pharynx clear of any exudate or lesions.Normal dentition.  Neck: normal, supple, no masses, no thyromegaly Respiratory: clear to auscultation bilaterally, no wheezing, fine crackles at bases. Increased respiratory effort. No accessory muscle use.  Cardiovascular: Regular rate and rhythm, 4/6 systolic ejection murmur, No extremity edema. 2+ pedal pulses. No carotid bruits.  Abdomen: no tenderness, no masses palpated. No hepatosplenomegaly. Bowel sounds positive.  Musculoskeletal: no clubbing / cyanosis. No joint deformity upper and lower extremities. Good ROM, no contractures. Normal muscle tone.  Skin: no rashes, lesions, ulcers. No induration Neurologic: CN 2-12 grossly intact. Sensation intact, DTR normal. Strength 5/5 in all 4.  Psychiatric: Normal judgment and insight. Alert and oriented x 3. Normal mood.   Labs on Admission: I have personally reviewed following labs and imaging studies  CBC:  Recent Labs Lab 03/09/17 0834  WBC 10.5    NEUTROABS 4.6  HGB 15.8*  HCT 48.4*  MCV 92.7  PLT 242   Basic Metabolic Panel:  Recent Labs Lab 03/09/17 0834  NA 142  K 3.4*  CL 104  CO2 27  GLUCOSE 117*  BUN 21*  CREATININE 0.97  CALCIUM 9.3   GFR: Estimated Creatinine Clearance: 44.6 mL/min (by C-G formula based on SCr of 0.97 mg/dL). Liver Function Tests: No results for input(s): AST, ALT, ALKPHOS, BILITOT, PROT, ALBUMIN in the last 168 hours. No results for input(s): LIPASE, AMYLASE in the last 168 hours. No results for input(s): AMMONIA in the last 168 hours. Coagulation Profile: No results for input(s): INR, PROTIME in the last 168 hours. Cardiac Enzymes:  Recent Labs Lab 03/09/17 0834  TROPONINI <0.03   BNP (last 3 results) No results for input(s): PROBNP in the last 8760 hours. HbA1C: No results for input(s): HGBA1C in the last 72 hours. CBG: No results for input(s): GLUCAP in the last 168 hours. Lipid Profile: No results for input(s): CHOL, HDL, LDLCALC, TRIG, CHOLHDL, LDLDIRECT in the last 72 hours. Thyroid Function Tests: No results for input(s): TSH, T4TOTAL, FREET4, T3FREE, THYROIDAB in the last 72 hours. Anemia Panel: No results for input(s): VITAMINB12, FOLATE, FERRITIN, TIBC, IRON, RETICCTPCT in the last 72 hours. Urine analysis:    Component Value Date/Time   COLORURINE YELLOW 02/17/2017 1229   APPEARANCEUR CLEAR 02/17/2017 1229   LABSPEC 1.015 02/17/2017 1229   PHURINE 6.5 02/17/2017 1229   GLUCOSEU NEGATIVE 02/17/2017 1229   HGBUR TRACE (A) 02/17/2017 1229   BILIRUBINUR NEGATIVE 02/17/2017 1229   KETONESUR NEGATIVE 02/17/2017 1229   PROTEINUR NEGATIVE 02/17/2017 1229   UROBILINOGEN 0.2 04/16/2015 2310   NITRITE NEGATIVE 02/17/2017 1229   LEUKOCYTESUR NEGATIVE 02/17/2017 1229    Radiological Exams on Admission: Dg Chest Portable 1 View  Result Date: 03/09/2017 CLINICAL DATA:  81 year old female with shortness of breath and wheezing since yesterday. Initial encounter. EXAM:  PORTABLE CHEST 1 VIEW COMPARISON:  12/28/2016 and 11/16/2016 chest x-ray. 11/19/2016 chest CT. FINDINGS: Pulmonary vascular congestion. Minimal peribronchial thickening without segmental consolidation. No  pneumothorax. Heart size within normal limits. Calcified aorta. IMPRESSION: Pulmonary vascular congestion. Minimal peribronchial thickening probably related to degree of pulmonary vascular congestion rather than bronchitis. No segmental consolidation. Aortic atherosclerosis. Electronically Signed   By: Genia Del M.D.   On: 03/09/2017 09:02    EKG: Independently reviewed. Sinus tachycardia, prolonged QT  Assessment/Plan Active Problems:   GERD (gastroesophageal reflux disease)   Acute respiratory failure with hypoxia (H[facility])   Essential hypertension   Restrictive lung disease   Parkinson's plus syndrome ([place])   Acute respiratory failure ([place])    1. Acute respiratory failure with hypoxia. Suspect this is related to bronchitis in the setting of her restrictive lung disease. Wean off oxygen as tolerated. 2. Acute bronchitis. Patient has a history of dysphasia and restrictive lung disease which predisposes her to bronchitis. Continue to treat with nebulizer treatments, steroids and antibiotics. Continue pulmonary hygiene 3. Restrictive lung disease. Follow-up with pulmonology for further management. There is no clear cause of the restrictive lung disease as of yet. 4. Parkinson's plus syndrome. Continue on Sinemet. 5. Hypertension. Continue outpatient regimen 6. GERD. Continue PPI  DVT prophylaxis: lovenox Code Status: full code Family Communication: discussed with husband and daughter at the bedside Disposition Plan: discharge home once improved Consults called:  Admission status: inpatient, Dollene Cleveland MD [facility] Pager [phone]  If 7PM-7AM, please contact night-coverage [email or web address] Password [facility]  03/09/2017, 5:21 PM

## 2017-03-09 NOTE — ED Notes (Signed)
PT ambulated with two person stand by assist in the hallway and became SOB with exertion but sats remained 90-92%.

## 2017-03-09 NOTE — ED Triage Notes (Signed)
Pt reports SOB since yesterday.  Denies cough.  Audible wheezing.  EMS administered albuterol neb and 125mg  solumedrol iv pta.  Pt has been taking some mucinex for the past week.

## 2017-03-10 DIAGNOSIS — J441 Chronic obstructive pulmonary disease with (acute) exacerbation: Secondary | ICD-10-CM | POA: Diagnosis not present

## 2017-03-10 DIAGNOSIS — J9601 Acute respiratory failure with hypoxia: Secondary | ICD-10-CM | POA: Diagnosis not present

## 2017-03-10 DIAGNOSIS — G20 Parkinson's disease: Secondary | ICD-10-CM | POA: Diagnosis not present

## 2017-03-10 DIAGNOSIS — J440 Chronic obstructive pulmonary disease with acute lower respiratory infection: Secondary | ICD-10-CM | POA: Diagnosis not present

## 2017-03-10 DIAGNOSIS — I4581 Long QT syndrome: Secondary | ICD-10-CM | POA: Diagnosis not present

## 2017-03-10 DIAGNOSIS — J209 Acute bronchitis, unspecified: Secondary | ICD-10-CM | POA: Diagnosis not present

## 2017-03-10 LAB — BASIC METABOLIC PANEL
Anion gap: 11 (ref 5–15)
BUN: 24 mg/dL — ABNORMAL HIGH (ref 6–20)
CALCIUM: 9 mg/dL (ref 8.9–10.3)
CO2: 25 mmol/L (ref 22–32)
Chloride: 108 mmol/L (ref 101–111)
Creatinine, Ser: 1.03 mg/dL — ABNORMAL HIGH (ref 0.44–1.00)
GFR calc non Af Amer: 51 mL/min — ABNORMAL LOW (ref >60)
GFR, EST AFRICAN AMERICAN: 59 mL/min — AB (ref >60)
GLUCOSE: 183 mg/dL — AB (ref 65–99)
Potassium: 3.4 mmol/L — ABNORMAL LOW (ref 3.5–5.1)
Sodium: 144 mmol/L (ref 135–145)

## 2017-03-10 LAB — CBC
HCT: 45.1 % (ref 36.0–46.0)
Hemoglobin: 14.8 g/dL (ref 12.0–15.0)
MCH: 30.2 pg (ref 26.0–34.0)
MCHC: 32.8 g/dL (ref 30.0–36.0)
MCV: 92 fL (ref 78.0–100.0)
PLATELETS: 253 10*3/uL (ref 150–400)
RBC: 4.9 MIL/uL (ref 3.87–5.11)
RDW: 14.3 % (ref 11.5–15.5)
WBC: 11.9 10*3/uL — ABNORMAL HIGH (ref 4.0–10.5)

## 2017-03-10 MED ORDER — ADULT MULTIVITAMIN W/MINERALS CH: 1.0000 | ORAL_TABLET | Freq: Every day | ORAL | Status: DC

## 2017-03-10 MED ORDER — GUAIFENESIN ER 600 MG PO TB12: 600.0000 mg | ORAL_TABLET | Freq: Two times a day (BID) | ORAL | Status: DC

## 2017-03-10 MED ORDER — LOSARTAN POTASSIUM 50 MG PO TABS: 50.0000 mg | ORAL_TABLET | Freq: Every day | ORAL | 2 refills | Status: DC

## 2017-03-10 MED ORDER — POTASSIUM CHLORIDE CRYS ER 20 MEQ PO TBCR
40.0000 meq | EXTENDED_RELEASE_TABLET | Freq: Once | ORAL | Status: AC
  Administered 2017-03-10: 40 meq via ORAL
  Filled 2017-03-10: qty 2

## 2017-03-10 MED ORDER — ENSURE ENLIVE PO LIQD: 237.0000 mL | Freq: Two times a day (BID) | ORAL | Status: DC

## 2017-03-10 MED ORDER — DOXYCYCLINE MONOHYDRATE 100 MG PO TABS: 100.0000 mg | ORAL_TABLET | Freq: Two times a day (BID) | ORAL | 0 refills | Status: DC

## 2017-03-10 MED ORDER — METHYLPREDNISOLONE SODIUM SUCC 40 MG IJ SOLR: 40.0000 mg | Freq: Two times a day (BID) | INTRAMUSCULAR | Status: DC

## 2017-03-10 MED ORDER — POTASSIUM CHLORIDE ER 10 MEQ PO TBCR: 10.0000 meq | EXTENDED_RELEASE_TABLET | Freq: Every day | ORAL | 0 refills | Status: DC

## 2017-03-10 MED ORDER — PREDNISONE 20 MG PO TABS: 40.0000 mg | ORAL_TABLET | Freq: Every day | ORAL | 0 refills | Status: AC

## 2017-03-10 NOTE — Discharge Summary (Addendum)
Marie [name], is a 81 y.o. female  DOB 1936/06/27  MRN [id number].  Admission date:  03/09/2017  Admitting Physician  Kathie Dike, MD  Discharge Date:  03/10/2017   Primary MD  [name], MD  Recommendations for primary care physician for things to follow:     Admission Diagnosis  COPD with acute exacerbation [facility]) [J44.1]   Discharge Diagnosis  COPD with acute exacerbation ([place]) [J44.1]    Active Problems:   GERD (gastroesophageal reflux disease)   Acute respiratory failure with hypoxia ([place])   Essential hypertension   Restrictive lung disease   Parkinson's plus syndrome ([place])   Acute respiratory failure ([place])      Past Medical History:  Diagnosis Date  . Balance disorder 2008.     Falls a lot.  She can be standing and then leans too far over to one side   . Bulging disc   . Chronic bronchitis ([place])    due to congestion at times, on prednisone and advair  . COPD (chronic obstructive pulmonary disease) ([place])   . Depression    many years ago  . GERD (gastroesophageal reflux disease)   . Hearing loss    mild  . Hyperlipidemia   . Hypertension   . Iatrogenic adrenal insufficiency ([place])   . Incontinence    not indicated at this visit.  Marland Kitchen Neuropathy    legs stay numb   . Osteoarthritis    hands,   . Osteoporosis   . Shortness of breath dyspnea   . Tubulovillous adenoma of rectum   . Wears dentures   . Wears glasses     Past Surgical History:  Procedure Laterality Date  . ABDOMINAL HYSTERECTOMY    . APPENDECTOMY    . CHONDROPLASTY  09/19/2014   Procedure: CHONDROPLASTY;  Surgeon: Alta Corning, MD;  Location: [facility];  Service: Orthopedics;;  . DILATION AND CURETTAGE OF UTERUS    . EXTERNAL FIXATION LEG  10/25/2012   Procedure: EXTERNAL FIXATION LEG;  Surgeon: Rozanna Box, MD;  Location: [place] [facility];  Service: Orthopedics;  Laterality: Right;  . EYE  SURGERY     bilateral cataract surgery and lens implant  . FINGER SURGERY     fusions and debridements for OA  . INCONTINENCE SURGERY     multiple procedures, not cured  . KNEE ARTHROSCOPY WITH LATERAL MENISECTOMY Right 09/19/2014   Procedure: KNEE ARTHROSCOPY WITH LATERAL MENISECTOMY;  Surgeon: Alta Corning, MD;  Location: [facility];  Service: Orthopedics;  Laterality: Right;  . KNEE ARTHROSCOPY WITH MEDIAL MENISECTOMY Right 09/19/2014   Procedure: RIGHT KNEE ARTHROSCOPY WITH MEDIAL AND LATERAL MENISECTOMIES. CHONDROPLASTY OF PATELLA-FEMORAL JOINT;  Surgeon: Alta Corning, MD;  Location: [facility];  Service: Orthopedics;  Laterality: Right;  . RECTAL BIOPSY  09/21/2011   Procedure: BIOPSY RECTAL;  Surgeon: Merrie Roof, MD;  Location: [facility];  Service: General;  Laterality: N/A;  3-4 cm  . RECTAL SURGERY     by  dr. Marlou Starks, removal of polyp  . TONSILLECTOMY         HPI  from the history and physical done on the day of admission:    Chief Complaint: shortness of breath  HPI: Marie [name] is a 82 y.o. female with medical history significant of restrictive lung disease followed by pulmonology, dysphagia and recurrent bronchitis. She felt that she was in her usual state of health on Sunday when she began developing wheezes. She took nebulizer with some improvement. The following day, she was anemic recently short of breath, wheezing, productive cough of yellow colored sputum. Did not have any fever. No chest pain. Her symptoms progressed leading her to the emergency room.  ED Course: Patient was noted to be hypoxic on room air, down to 86%. She received steroids on route to the emergency room and continued nebulizer treatments with some improvement of her breathing. Labs were relatively unremarkable. Chest x-ray showed possible pulmonary vascular congestion. She's been referred for admission.  Review of Systems: As per HPI otherwise 10 point review of  systems negative.      Hospital Course:   1) Acute Hypoxic Respiratory Failure - Secondary to Acute bronchitis, hypoxia has resolved, post ambulation O2 sats 92-94% on room air.  Treated with bronchodilators steroids and antibiotics with significant improvement. Patient will be discharged on doxycycline, prednisone and bronchodilators. Please note the patient was initially admitted to inpatient status, however with the above-mentioned treatment regimen she improved rapidly beyond expectation, so she did not stay 2 midnights  2)Restrictive lung disease- acute exacerbation secondary to acute bronchitis as above 1. Follow-up with pulmonology for further management. There is no clear cause of the restrictive lung disease as of yet.  3)Parkinson's  Syndrome- stable, Continue on Sinemet.  4)HTN- stable, continue losartan, stop HCTZ due to hypokalemia (patient is already on Lasix)  5)Possible CHF- on admission chest x-ray findings were suggestive of possible CHF however clinical exam was more consistent with acute bronchitis superimposed on restrictive lung disease. ProBNP was only 109, recent echocardiogram from 12/15/2016 with preserved EF. Patient responded well to treatment for bronchitis and restrictive lung disease  NB!!! Please note the patient was initially admitted to inpatient status, however with the above-mentioned treatment regimen she improved rapidly beyond expectation, so she did not stay 2 midnights    Discharge Condition: stable, much improved  Follow UP    Diet and Activity recommendation:  As advised  Discharge Instructions     Discharge Instructions    (HEART FAILURE PATIENTS) Call MD:  Anytime you have any of the following symptoms: 1) 3 pound weight gain in 24 hours or 5 pounds in 1 week 2) shortness of breath, with or without a dry hacking cough 3) swelling in the hands, feet or stomach 4) if you have to sleep on extra pillows at night in order to breathe.     Complete by:  As directed    Call MD for:  difficulty breathing, headache or visual disturbances    Complete by:  As directed    Call MD for:  persistant dizziness or light-headedness    Complete by:  As directed    Call MD for:  persistant nausea and vomiting    Complete by:  As directed    Call MD for:  temperature >100.4    Complete by:  As directed    Diet - low sodium heart healthy    Complete by:  As directed    Increase activity slowly  Complete by:  As directed         Discharge Medications     Allergies as of 03/10/2017      Reactions   Ertapenem Hives   Caused whole body to turn red Other reaction(s): Redness Caused whole body to turn red   Codeine Other (See Comments)   hallucinations   Demerol Other (See Comments)   hallucinations   Morphine And Related Other (See Comments)   hallucinations   Other Other (See Comments)   Invan 7- pt became red all over   Sulfate Other (See Comments)   unknown      Medication List    STOP taking these medications   losartan-hydrochlorothiazide 100-25 MG tablet Commonly known as:  HYZAAR     TAKE these medications   ADVAIR DISKUS 250-50 MCG/DOSE Aepb Generic drug:  Fluticasone-Salmeterol INHALE ONE PUFF BY MOUTH TWICE DAILY   albuterol (2.5 MG/3ML) 0.083% nebulizer solution Commonly known as:  PROVENTIL USE ONE VIAL IN NEBULIZER EVERY 6 HOURS AS NEEDED FOR WHEEZING OR SHORTNESS OF BREATH   aspirin 81 MG tablet Take 81 mg by mouth daily. Buys OTC   benzonatate 200 MG capsule Commonly known as:  TESSALON Take 1 capsule (200 mg total) by mouth 3 (three) times daily as needed for cough.   carbidopa-levodopa 25-100 MG tablet Commonly known as:  SINEMET IR Take 1 tablet by mouth 3 (three) times daily.   doxycycline 100 MG tablet Commonly known as:  ADOXA Take 1 tablet (100 mg total) by mouth 2 (two) times daily.   furosemide 40 MG tablet Commonly known as:  LASIX Take 40 mg by mouth daily.   guaiFENesin  600 MG 12 hr tablet Commonly known as:  MUCINEX Take 2 tablets (1,200 mg total) by mouth 2 (two) times daily.   losartan 50 MG tablet Commonly known as:  COZAAR Take 1 tablet (50 mg total) by mouth daily.   montelukast 10 MG tablet Commonly known as:  SINGULAIR Take 10 mg by mouth at bedtime.   nystatin 100000 UNIT/ML suspension Commonly known as:  MYCOSTATIN Take 5 mLs (500,000 Units total) by mouth 4 (four) times daily. What changed:  when to take this   omeprazole 40 MG capsule Commonly known as:  PRILOSEC Take 1 capsule (40 mg total) by mouth 2 (two) times daily.   potassium chloride 10 MEQ tablet Commonly known as:  K-DUR Take 1 tablet (10 mEq total) by mouth daily.   pravastatin 40 MG tablet Commonly known as:  PRAVACHOL Take 20 mg by mouth daily.   predniSONE 20 MG tablet Commonly known as:  DELTASONE Take 2 tablets (40 mg total) by mouth daily with breakfast.   sodium chloride HYPERTONIC 3 % nebulizer solution Take 1 vial by nebulization BID   traZODone 50 MG tablet Commonly known as:  DESYREL Take 25 mg by mouth at bedtime.   Vitamin D 2000 units Caps Take 2,000 Units by mouth daily.       Major procedures and Radiology Reports - PLEASE review detailed and final reports for all details, in brief -    Dg Chest Portable 1 View  Result Date: 03/09/2017 CLINICAL DATA:  81 year old female with shortness of breath and wheezing since yesterday. Initial encounter. EXAM: PORTABLE CHEST 1 VIEW COMPARISON:  12/28/2016 and 11/16/2016 chest x-ray. 11/19/2016 chest CT. FINDINGS: Pulmonary vascular congestion. Minimal peribronchial thickening without segmental consolidation. No pneumothorax. Heart size within normal limits. Calcified aorta. IMPRESSION: Pulmonary vascular congestion. Minimal peribronchial thickening probably  related to degree of pulmonary vascular congestion rather than bronchitis. No segmental consolidation. Aortic atherosclerosis. Electronically Signed    By: Genia Del M.D.   On: 03/09/2017 09:02    Micro Results    No results found for this or any previous visit (from the past 240 hour(s)).     Today   Subjective    Maryann Conners today has no new c/o          Patient has been seen and examined prior to discharge   Objective   Blood pressure (!) 116/53, pulse 77, temperature 98.9 F (37.2 C), temperature source Oral, resp. rate 18, height 5\' 4"  (1.626 m), weight 65.8 kg (145 lb), SpO2 92 %.   Intake/Output Summary (Last 24 hours) at 03/10/17 1556 Last data filed at 03/10/17 1300  Gross per 24 hour  Intake              720 ml  Output              400 ml  Net              320 ml    Exam Gen:- Awake  In no apparent distress  HEENT:- [place].AT,   Neck-Supple Neck,No JVD,  Lungs- mostly clear  CV- S1, S2 normal Abd-  +ve B.Sounds, Abd Soft, No tenderness,    Extremity/Skin:- Intact peripheral pulses     Data Review   CBC w Diff: Lab Results  Component Value Date   WBC 11.9 (H) 03/10/2017   HGB 14.8 03/10/2017   HCT 45.1 03/10/2017   PLT 253 03/10/2017   LYMPHOPCT 39 03/09/2017   MONOPCT 8 03/09/2017   EOSPCT 8 03/09/2017   BASOPCT 1 03/09/2017    CMP: Lab Results  Component Value Date   NA 144 03/10/2017   K 3.4 (L) 03/10/2017   CL 108 03/10/2017   CO2 25 03/10/2017   BUN 24 (H) 03/10/2017   CREATININE 1.03 (H) 03/10/2017   CREATININE 1.00 (H) 02/17/2017   PROT 6.0 (L) 02/17/2017   ALBUMIN 3.6 02/17/2017   BILITOT 0.7 02/17/2017   ALKPHOS 47 02/17/2017   AST 21 02/17/2017   ALT 17 02/17/2017  .   Total Discharge time is about 33 minutes  Shreyan Hinz M.D on 03/10/2017 at 3:56 PM    NB!!! Please note the patient was initially admitted to inpatient status, however with the above-mentioned treatment regimen she improved rapidly beyond expectation, so she did not stay [facility]   Office  [phone]  Voice Recognition Viviann Spare dictation system was used to create  this note, attempts have been made to correct errors. Please contact the author with questions and/or clarifications.

## 2017-03-10 NOTE — Progress Notes (Signed)
Marie [name] discharged Home per MD order.  Discharge instructions reviewed and discussed with the patient, all questions and concerns answered. Copy of instructions and scripts given to patient.  Allergies as of 03/10/2017      Reactions   Ertapenem Hives   Caused whole body to turn red Other reaction(s): Redness Caused whole body to turn red   Codeine Other (See Comments)   hallucinations   Demerol Other (See Comments)   hallucinations   Morphine And Related Other (See Comments)   hallucinations   Other Other (See Comments)   Invan 7- pt became red all over   Sulfate Other (See Comments)   unknown      Medication List    STOP taking these medications   losartan-hydrochlorothiazide 100-25 MG tablet Commonly known as:  HYZAAR     TAKE these medications   ADVAIR DISKUS 250-50 MCG/DOSE Aepb Generic drug:  Fluticasone-Salmeterol INHALE ONE PUFF BY MOUTH TWICE DAILY   albuterol (2.5 MG/3ML) 0.083% nebulizer solution Commonly known as:  PROVENTIL USE ONE VIAL IN NEBULIZER EVERY 6 HOURS AS NEEDED FOR WHEEZING OR SHORTNESS OF BREATH   aspirin 81 MG tablet Take 81 mg by mouth daily. Buys OTC   benzonatate 200 MG capsule Commonly known as:  TESSALON Take 1 capsule (200 mg total) by mouth 3 (three) times daily as needed for cough.   carbidopa-levodopa 25-100 MG tablet Commonly known as:  SINEMET IR Take 1 tablet by mouth 3 (three) times daily.   doxycycline 100 MG tablet Commonly known as:  ADOXA Take 1 tablet (100 mg total) by mouth 2 (two) times daily.   furosemide 40 MG tablet Commonly known as:  LASIX Take 40 mg by mouth daily.   guaiFENesin 600 MG 12 hr tablet Commonly known as:  MUCINEX Take 2 tablets (1,200 mg total) by mouth 2 (two) times daily.   losartan 50 MG tablet Commonly known as:  COZAAR Take 1 tablet (50 mg total) by mouth daily.   montelukast 10 MG tablet Commonly known as:  SINGULAIR Take 10 mg by mouth at bedtime.   nystatin 100000  UNIT/ML suspension Commonly known as:  MYCOSTATIN Take 5 mLs (500,000 Units total) by mouth 4 (four) times daily. What changed:  when to take this   omeprazole 40 MG capsule Commonly known as:  PRILOSEC Take 1 capsule (40 mg total) by mouth 2 (two) times daily.   potassium chloride 10 MEQ tablet Commonly known as:  K-DUR Take 1 tablet (10 mEq total) by mouth daily.   pravastatin 40 MG tablet Commonly known as:  PRAVACHOL Take 20 mg by mouth daily.   predniSONE 20 MG tablet Commonly known as:  DELTASONE Take 2 tablets (40 mg total) by mouth daily with breakfast.   sodium chloride HYPERTONIC 3 % nebulizer solution Take 1 vial by nebulization BID   traZODone 50 MG tablet Commonly known as:  DESYREL Take 25 mg by mouth at bedtime.   Vitamin D 2000 units Caps Take 2,000 Units by mouth daily.       Patients skin is clean, dry and intact, no evidence of skin break down. IV site discontinued and catheter remains intact. Site without signs and symptoms of complications. Dressing and pressure applied.  Patient escorted to car by NT in a wheelchair,  no distress noted upon discharge.  Marie [name] 03/10/2017 1:48 PM

## 2017-03-10 NOTE — Care Management Note (Signed)
Case Management Note  Patient Details  Name: ACHSAH MCQUADE MRN: [id number] Date of Birth: July 02, 1936  Subjective/Objective:                  Pt admitted with SOB. She is from home, lives with her husband and is ind with ADL's. She has no HH services or DME needs pta. She has PCP, transportation and insurance with drug coverage. Pt communicates no needs.   Action/Plan: Pt plans to return home with self care. No CM needs.   Expected Discharge Date:  03/11/17               Expected Discharge Plan:  Home/Self Care  In-House Referral:  NA  Discharge planning Services  CM Consult  Post Acute Care Choice:  NA Choice offered to:  NA  Status of Service:  Completed, signed off  Sherald Barge, RN 03/10/2017, 10:39 AM

## 2017-03-10 NOTE — Progress Notes (Signed)
Initial Nutrition Assessment  DOCUMENTATION CODES:   Severe malnutrition in context of chronic illness  INTERVENTION:  Ensure Enlive po BID to help meet increased energy demand (each supplement provides 350 kcal and 20 grams of protein)  Encourage frequent small nutrient dense meals   MVI daily   NUTRITION DIAGNOSIS:   Malnutrition (severe) related to  (acute on chronic COPD) as evidenced by severe depletion of muscle mass, percent weight loss (>10% in 6 months).  Unintentional weight loss -expected to be related to chronic respiratory failure (COPD) as evidenced by acute hospitalizations and unintended wt loss of 14% since November 2017.   GOAL:   Patient will meet greater than or equal to 90% of their needs to prevent further weight loss and decline in nutrition status   MONITOR:   PO intake, Supplement acceptance, Labs, Weight trends  REASON FOR ASSESSMENT:   Malnutrition Screening Tool    ASSESSMENT:  Marie [name] is a 81 y.o. fm with a history of GERD, HTN, COPD, Chronic bronchitis, OA and UTI.   She has had multiple falls and has hx of balance disorder. She uses a walker to ambulate at home. Her home diet is Regular and appetite self-reported as fair.  Diet hx: breakfast is usually 3 "NABS" and coffee, a sandwich for lunch and hot dinner in the evening. She is able to help shop and prepare meals but says her husband does most of the work. Feeds herslf and has dentures which don't fit as well now that she has had a 30# wt loss.  Weight hx: she has a persistent, significant  downward trend in her weight since last September. 9/25-  177# 11/10- 169# 12/5-  165# 12/12- 161# 5/22-  145# Total of 14% loss since November which was unintentional.   Nutrition-Focused physical exam completed. Findings are mild-moderate fat depletion, moderate and severe muscle depletion, and no edema.    Education:  -focus on prevention of additional wt loss and continue MVI daily  especially focus on antioxidants (vitamin A,C and E) -purposefully consume high quality protein with a goal of 25-28 gr 3 x daily - small meals to minimize the work of chewing   Recent Labs Lab 03/09/17 0834 03/10/17 0513  NA 142 144  K 3.4* 3.4*  CL 104 108  CO2 27 25  BUN 21* 24*  CREATININE 0.97 1.03*  CALCIUM 9.3 9.0  GLUCOSE 117* 183*   Labs: potassium 3.4, Glu 183 mg/dl Meds. Vitmain D, lasix, solumedrol   Diet Order:  Diet Heart Room service appropriate? Yes; Fluid consistency: Thin  Skin:  Reviewed, no issues  Last BM:  03/09/17  Height:   Ht Readings from Last 1 Encounters:  03/09/17 5\' 4"  (1.626 m)    Weight:   Wt Readings from Last 1 Encounters:  03/09/17 145 lb (65.8 kg)    Ideal Body Weight:  54.5 kg  BMI:  Body mass index is 24.89 kg/m.  Estimated Nutritional Needs:   Kcal:  1650-1848 (25-28 kcal/kg)  Protein:  79-86 gr (1.2-1.3 gr/kg)  Fluid:  1.6-1.8 lityers daily  EDUCATION NEEDS:   Education needs addressed  Colman Cater Marie,RD,CSG,LDN Office: [phone] Pager: [phone]

## 2017-03-11 ENCOUNTER — Telehealth: Payer: Self-pay | Admitting: *Deleted

## 2017-03-11 NOTE — Telephone Encounter (Signed)
Pt called we scheduled the pt for a hosp f/u on Wed 03/17/17

## 2017-03-16 ENCOUNTER — Encounter (HOSPITAL_COMMUNITY): Payer: Self-pay | Admitting: *Deleted

## 2017-03-16 ENCOUNTER — Inpatient Hospital Stay (HOSPITAL_COMMUNITY): Payer: Medicare Other

## 2017-03-16 ENCOUNTER — Emergency Department (HOSPITAL_COMMUNITY): Payer: Medicare Other

## 2017-03-16 ENCOUNTER — Inpatient Hospital Stay (HOSPITAL_COMMUNITY)
Admission: EM | Admit: 2017-03-16 | Discharge: 2017-03-19 | DRG: 189 | Disposition: A | Discharge status: Home / Self Care | Payer: Medicare Other | Attending: Internal Medicine | Admitting: Internal Medicine

## 2017-03-16 DIAGNOSIS — R0902 Hypoxemia: Secondary | ICD-10-CM | POA: Diagnosis not present

## 2017-03-16 DIAGNOSIS — R200 Anesthesia of skin: Secondary | ICD-10-CM | POA: Diagnosis present

## 2017-03-16 DIAGNOSIS — E43 Unspecified severe protein-calorie malnutrition: Secondary | ICD-10-CM | POA: Diagnosis not present

## 2017-03-16 DIAGNOSIS — R0602 Shortness of breath: Secondary | ICD-10-CM | POA: Diagnosis not present

## 2017-03-16 DIAGNOSIS — R062 Wheezing: Secondary | ICD-10-CM | POA: Diagnosis not present

## 2017-03-16 DIAGNOSIS — G629 Polyneuropathy, unspecified: Secondary | ICD-10-CM | POA: Diagnosis present

## 2017-03-16 DIAGNOSIS — I248 Other forms of acute ischemic heart disease: Secondary | ICD-10-CM | POA: Diagnosis not present

## 2017-03-16 DIAGNOSIS — J209 Acute bronchitis, unspecified: Secondary | ICD-10-CM | POA: Diagnosis not present

## 2017-03-16 DIAGNOSIS — R0600 Dyspnea, unspecified: Secondary | ICD-10-CM | POA: Diagnosis not present

## 2017-03-16 DIAGNOSIS — J440 Chronic obstructive pulmonary disease with acute lower respiratory infection: Secondary | ICD-10-CM

## 2017-03-16 DIAGNOSIS — K219 Gastro-esophageal reflux disease without esophagitis: Secondary | ICD-10-CM | POA: Diagnosis present

## 2017-03-16 DIAGNOSIS — N179 Acute kidney failure, unspecified: Secondary | ICD-10-CM | POA: Diagnosis not present

## 2017-03-16 DIAGNOSIS — J9601 Acute respiratory failure with hypoxia: Secondary | ICD-10-CM | POA: Diagnosis not present

## 2017-03-16 DIAGNOSIS — R7989 Other specified abnormal findings of blood chemistry: Secondary | ICD-10-CM | POA: Diagnosis not present

## 2017-03-16 DIAGNOSIS — E860 Dehydration: Secondary | ICD-10-CM | POA: Diagnosis present

## 2017-03-16 DIAGNOSIS — E785 Hyperlipidemia, unspecified: Secondary | ICD-10-CM | POA: Diagnosis present

## 2017-03-16 DIAGNOSIS — J984 Other disorders of lung: Secondary | ICD-10-CM

## 2017-03-16 DIAGNOSIS — G232 Striatonigral degeneration: Secondary | ICD-10-CM | POA: Diagnosis present

## 2017-03-16 DIAGNOSIS — F329 Major depressive disorder, single episode, unspecified: Secondary | ICD-10-CM | POA: Diagnosis present

## 2017-03-16 DIAGNOSIS — I259 Chronic ischemic heart disease, unspecified: Secondary | ICD-10-CM | POA: Diagnosis not present

## 2017-03-16 DIAGNOSIS — Z7982 Long term (current) use of aspirin: Secondary | ICD-10-CM

## 2017-03-16 DIAGNOSIS — Z79899 Other long term (current) drug therapy: Secondary | ICD-10-CM | POA: Diagnosis not present

## 2017-03-16 DIAGNOSIS — E876 Hypokalemia: Secondary | ICD-10-CM | POA: Diagnosis present

## 2017-03-16 DIAGNOSIS — R748 Abnormal levels of other serum enzymes: Secondary | ICD-10-CM | POA: Diagnosis not present

## 2017-03-16 DIAGNOSIS — H9190 Unspecified hearing loss, unspecified ear: Secondary | ICD-10-CM | POA: Diagnosis present

## 2017-03-16 DIAGNOSIS — R778 Other specified abnormalities of plasma proteins: Secondary | ICD-10-CM

## 2017-03-16 DIAGNOSIS — G20 Parkinson's disease: Secondary | ICD-10-CM | POA: Diagnosis present

## 2017-03-16 DIAGNOSIS — I10 Essential (primary) hypertension: Secondary | ICD-10-CM | POA: Diagnosis not present

## 2017-03-16 DIAGNOSIS — R0682 Tachypnea, not elsewhere classified: Secondary | ICD-10-CM | POA: Diagnosis not present

## 2017-03-16 LAB — CBC WITH DIFFERENTIAL/PLATELET
Basophils Absolute: 0.1 10*3/uL (ref 0.0–0.1)
Basophils Relative: 1 %
EOS ABS: 0.5 10*3/uL (ref 0.0–0.7)
EOS PCT: 4 %
HCT: 50.8 % — ABNORMAL HIGH (ref 36.0–46.0)
Hemoglobin: 17.2 g/dL — ABNORMAL HIGH (ref 12.0–15.0)
LYMPHS ABS: 2.3 10*3/uL (ref 0.7–4.0)
Lymphocytes Relative: 16 %
MCH: 30.7 pg (ref 26.0–34.0)
MCHC: 33.9 g/dL (ref 30.0–36.0)
MCV: 90.7 fL (ref 78.0–100.0)
MONO ABS: 1.2 10*3/uL — AB (ref 0.1–1.0)
MONOS PCT: 8 %
Neutro Abs: 10.3 10*3/uL — ABNORMAL HIGH (ref 1.7–7.7)
Neutrophils Relative %: 72 %
PLATELETS: 224 10*3/uL (ref 150–400)
RBC: 5.6 MIL/uL — AB (ref 3.87–5.11)
RDW: 13.9 % (ref 11.5–15.5)
WBC: 14.4 10*3/uL — AB (ref 4.0–10.5)

## 2017-03-16 LAB — TROPONIN I
TROPONIN I: 0.09 ng/mL — AB (ref <0.03)
TROPONIN I: 0.11 ng/mL — AB (ref <0.03)
TROPONIN I: 0.09 ng/mL — AB (ref <0.03)

## 2017-03-16 LAB — HEPARIN LEVEL (UNFRACTIONATED)
Heparin Unfractionated: <0.1 IU/mL — ABNORMAL LOW (ref 0.30–0.70)
Heparin Unfractionated: 0.29 IU/mL — ABNORMAL LOW (ref 0.30–0.70)

## 2017-03-16 LAB — COMPREHENSIVE METABOLIC PANEL
ALBUMIN: 4.1 g/dL (ref 3.5–5.0)
ALT: 28 U/L (ref 14–54)
AST: 32 U/L (ref 15–41)
Alkaline Phosphatase: 47 U/L (ref 38–126)
Anion gap: 12 (ref 5–15)
BUN: 49 mg/dL — AB (ref 6–20)
CHLORIDE: 93 mmol/L — AB (ref 101–111)
CO2: 34 mmol/L — ABNORMAL HIGH (ref 22–32)
Calcium: 9.2 mg/dL (ref 8.9–10.3)
Creatinine, Ser: 1.54 mg/dL — ABNORMAL HIGH (ref 0.44–1.00)
GFR calc Af Amer: 36 mL/min — ABNORMAL LOW (ref >60)
GFR, EST NON AFRICAN AMERICAN: 31 mL/min — AB (ref >60)
GLUCOSE: 135 mg/dL — AB (ref 65–99)
Potassium: 3.4 mmol/L — ABNORMAL LOW (ref 3.5–5.1)
Sodium: 139 mmol/L (ref 135–145)
Total Bilirubin: 1.5 mg/dL — ABNORMAL HIGH (ref 0.3–1.2)
Total Protein: 6.8 g/dL (ref 6.5–8.1)

## 2017-03-16 LAB — D-DIMER, QUANTITATIVE (NOT AT ARMC): D DIMER QUANT: 3.32 ug{FEU}/mL — AB (ref 0.00–0.50)

## 2017-03-16 MED ORDER — SODIUM CHLORIDE 0.9 % IV BOLUS (SEPSIS)
500.0000 mL | Freq: Once | INTRAVENOUS | Status: AC
  Administered 2017-03-16: 500 mL via INTRAVENOUS

## 2017-03-16 MED ORDER — HYDRALAZINE HCL 20 MG/ML IJ SOLN: 5.0000 mg | Freq: Once | INTRAMUSCULAR | Status: DC

## 2017-03-16 MED ORDER — IPRATROPIUM-ALBUTEROL 0.5-2.5 (3) MG/3ML IN SOLN
3.0000 mL | Freq: Four times a day (QID) | RESPIRATORY_TRACT | Status: DC
  Administered 2017-03-17 – 2017-03-19 (×9): 3 mL via RESPIRATORY_TRACT
  Filled 2017-03-16 (×9): qty 3

## 2017-03-16 MED ORDER — MONTELUKAST SODIUM 10 MG PO TABS
10.0000 mg | ORAL_TABLET | Freq: Every day | ORAL | Status: DC
  Administered 2017-03-16 – 2017-03-18 (×3): 10 mg via ORAL
  Filled 2017-03-16 (×3): qty 1

## 2017-03-16 MED ORDER — IPRATROPIUM-ALBUTEROL 0.5-2.5 (3) MG/3ML IN SOLN
3.0000 mL | RESPIRATORY_TRACT | Status: DC | PRN
  Administered 2017-03-16: 3 mL via RESPIRATORY_TRACT
  Filled 2017-03-16: qty 3

## 2017-03-16 MED ORDER — ACETAMINOPHEN 650 MG RE SUPP: 650.0000 mg | Freq: Four times a day (QID) | RECTAL | Status: DC | PRN

## 2017-03-16 MED ORDER — MOMETASONE FURO-FORMOTEROL FUM 200-5 MCG/ACT IN AERO
2.0000 | INHALATION_SPRAY | Freq: Two times a day (BID) | RESPIRATORY_TRACT | Status: DC
  Administered 2017-03-17 – 2017-03-19 (×5): 2 via RESPIRATORY_TRACT
  Filled 2017-03-16: qty 8.8

## 2017-03-16 MED ORDER — TRAZODONE HCL 50 MG PO TABS
25.0000 mg | ORAL_TABLET | Freq: Every day | ORAL | Status: DC
  Administered 2017-03-16 – 2017-03-18 (×3): 25 mg via ORAL
  Filled 2017-03-16 (×3): qty 1

## 2017-03-16 MED ORDER — ASPIRIN 325 MG PO TABS
ORAL_TABLET | ORAL | Status: AC
  Filled 2017-03-16: qty 1

## 2017-03-16 MED ORDER — VITAMIN D 1000 UNITS PO TABS
2000.0000 [IU] | ORAL_TABLET | Freq: Every day | ORAL | Status: DC
  Administered 2017-03-16 – 2017-03-18 (×3): 2000 [IU] via ORAL
  Filled 2017-03-16 (×3): qty 2

## 2017-03-16 MED ORDER — HEPARIN (PORCINE) IN NACL 100-0.45 UNIT/ML-% IJ SOLN
900.0000 [IU]/h | INTRAMUSCULAR | Status: DC
  Administered 2017-03-16: 850 [IU]/h via INTRAVENOUS
  Filled 2017-03-16: qty 250

## 2017-03-16 MED ORDER — SODIUM CHLORIDE 0.9% FLUSH: 3.0000 mL | INTRAVENOUS | Status: DC | PRN

## 2017-03-16 MED ORDER — AZITHROMYCIN 250 MG PO TABS
500.0000 mg | ORAL_TABLET | Freq: Every day | ORAL | Status: DC
  Administered 2017-03-16 – 2017-03-17 (×2): 500 mg via ORAL
  Filled 2017-03-16 (×2): qty 2

## 2017-03-16 MED ORDER — ASPIRIN 325 MG PO TABS
325.0000 mg | ORAL_TABLET | Freq: Once | ORAL | Status: AC
  Administered 2017-03-16: 325 mg via ORAL

## 2017-03-16 MED ORDER — ENSURE ENLIVE PO LIQD
237.0000 mL | Freq: Two times a day (BID) | ORAL | Status: DC
  Administered 2017-03-17 – 2017-03-18 (×3): 237 mL via ORAL

## 2017-03-16 MED ORDER — METHYLPREDNISOLONE SODIUM SUCC 40 MG IJ SOLR
40.0000 mg | INTRAMUSCULAR | Status: DC
  Administered 2017-03-16: 40 mg via INTRAVENOUS
  Filled 2017-03-16: qty 1

## 2017-03-16 MED ORDER — SODIUM CHLORIDE 0.9 % IV SOLN: 250.0000 mL | INTRAVENOUS | Status: DC | PRN

## 2017-03-16 MED ORDER — SODIUM CHLORIDE 0.9% FLUSH
3.0000 mL | Freq: Two times a day (BID) | INTRAVENOUS | Status: DC
  Administered 2017-03-17: 3 mL via INTRAVENOUS

## 2017-03-16 MED ORDER — CARBIDOPA-LEVODOPA 25-100 MG PO TABS
1.0000 | ORAL_TABLET | Freq: Three times a day (TID) | ORAL | Status: DC
  Administered 2017-03-16 – 2017-03-19 (×9): 1 via ORAL
  Filled 2017-03-16 (×9): qty 1

## 2017-03-16 MED ORDER — SODIUM CHLORIDE 0.9 % IV BOLUS (SEPSIS)
1000.0000 mL | Freq: Once | INTRAVENOUS | Status: AC
  Administered 2017-03-16: 1000 mL via INTRAVENOUS

## 2017-03-16 MED ORDER — SODIUM CHLORIDE 0.9% FLUSH: 3.0000 mL | Freq: Two times a day (BID) | INTRAVENOUS | Status: DC

## 2017-03-16 MED ORDER — HEPARIN BOLUS VIA INFUSION
4000.0000 [IU] | Freq: Once | INTRAVENOUS | Status: AC
  Administered 2017-03-16: 4000 [IU] via INTRAVENOUS

## 2017-03-16 MED ORDER — ONDANSETRON HCL 4 MG PO TABS: 4.0000 mg | ORAL_TABLET | Freq: Four times a day (QID) | ORAL | Status: DC | PRN

## 2017-03-16 MED ORDER — ACETAMINOPHEN 325 MG PO TABS
650.0000 mg | ORAL_TABLET | Freq: Four times a day (QID) | ORAL | Status: DC | PRN
  Administered 2017-03-17: 650 mg via ORAL
  Filled 2017-03-16: qty 2

## 2017-03-16 MED ORDER — PANTOPRAZOLE SODIUM 40 MG PO TBEC
40.0000 mg | DELAYED_RELEASE_TABLET | Freq: Every day | ORAL | Status: DC
  Administered 2017-03-16 – 2017-03-18 (×3): 40 mg via ORAL
  Filled 2017-03-16 (×3): qty 1

## 2017-03-16 MED ORDER — ASPIRIN EC 81 MG PO TBEC
81.0000 mg | DELAYED_RELEASE_TABLET | Freq: Every day | ORAL | Status: DC
  Administered 2017-03-16 – 2017-03-19 (×4): 81 mg via ORAL
  Filled 2017-03-16 (×4): qty 1

## 2017-03-16 MED ORDER — ORAL CARE MOUTH RINSE
15.0000 mL | Freq: Two times a day (BID) | OROMUCOSAL | Status: DC
  Administered 2017-03-17 – 2017-03-18 (×4): 15 mL via OROMUCOSAL

## 2017-03-16 MED ORDER — TECHNETIUM TC 99M DIETHYLENETRIAME-PENTAACETIC ACID
30.0000 | Freq: Once | INTRAVENOUS | Status: AC | PRN
  Administered 2017-03-16: 30 via INTRAVENOUS

## 2017-03-16 MED ORDER — PRAVASTATIN SODIUM 10 MG PO TABS
20.0000 mg | ORAL_TABLET | Freq: Every day | ORAL | Status: DC
  Administered 2017-03-16 – 2017-03-18 (×3): 20 mg via ORAL
  Filled 2017-03-16 (×3): qty 2

## 2017-03-16 MED ORDER — ONDANSETRON HCL 4 MG/2ML IJ SOLN: 4.0000 mg | Freq: Four times a day (QID) | INTRAMUSCULAR | Status: DC | PRN

## 2017-03-16 MED ORDER — TECHNETIUM TO 99M ALBUMIN AGGREGATED
4.0000 | Freq: Once | INTRAVENOUS | Status: AC | PRN
  Administered 2017-03-16: 4.3 via INTRAVENOUS

## 2017-03-16 MED ORDER — GUAIFENESIN ER 600 MG PO TB12
1200.0000 mg | ORAL_TABLET | Freq: Two times a day (BID) | ORAL | Status: DC
  Administered 2017-03-16 – 2017-03-19 (×6): 1200 mg via ORAL
  Filled 2017-03-16 (×6): qty 2

## 2017-03-16 MED ORDER — SODIUM CHLORIDE 0.9 % IV SOLN
INTRAVENOUS | Status: DC
Start: 2017-03-16 — End: 2017-03-18
  Administered 2017-03-16 – 2017-03-18 (×5): via INTRAVENOUS

## 2017-03-16 NOTE — ED Notes (Signed)
Pt calm and sleeping at this time. BP lowered and MD made aware.

## 2017-03-16 NOTE — Progress Notes (Signed)
ANTICOAGULATION CONSULT NOTE - Initial Consult  Pharmacy Consult for HEPARIN Indication: ACS vs PE  Allergies  Allergen Reactions  . Ertapenem Hives    Caused whole body to turn red Other reaction(s): Redness Caused whole body to turn red  . Codeine Other (See Comments)    hallucinations  . Demerol Other (See Comments)    hallucinations  . Morphine And Related Other (See Comments)    hallucinations  . Other Other (See Comments)    Invan 7- pt became red all over  . Sulfate Other (See Comments)    unknown   Patient Measurements: Height: 5\' 4"  (162.6 cm) Weight: 125 lb (56.7 kg) IBW/kg (Calculated) : 54.7 HEPARIN DW (KG): 56.7  Vital Signs: Temp: 97.6 F (36.4 C) (05/29 0728) Temp Source: Axillary (05/29 0728) BP: 102/80 (05/29 0930) Pulse Rate: 98 (05/29 0930)  Labs:  Recent Labs  03/16/17 0917  HGB 17.2*  HCT 50.8*  PLT 224  CREATININE 1.54*  TROPONINI 0.09*   Estimated Creatinine Clearance: 26 mL/min (A) (by C-G formula based on SCr of 1.54 mg/dL (H)).  Medical History: Past Medical History:  Diagnosis Date  . Balance disorder 2008.     Falls a lot.  She can be standing and then leans too far over to one side   . Bulging disc   . Chronic bronchitis ([place])    due to congestion at times, on prednisone and advair  . COPD (chronic obstructive pulmonary disease) ([place])   . Depression    many years ago  . GERD (gastroesophageal reflux disease)   . Hearing loss    mild  . Hyperlipidemia   . Hypertension   . Iatrogenic adrenal insufficiency ([place])   . Incontinence    not indicated at this visit.  Marland Kitchen Neuropathy    legs stay numb   . Osteoarthritis    hands,   . Osteoporosis   . Shortness of breath dyspnea   . Tubulovillous adenoma of rectum   . Wears dentures   . Wears glasses    Medications:   (Not in a hospital admission)  Assessment: 81yo female presented to ED with dyspnea.  Asked to initiate heparin for PE vs ACS.   Goal of Therapy:   Heparin level 0.3-0.7 units/ml Monitor platelets by anticoagulation protocol: Yes   Plan:   Heparin 4000 units IV now x 1  Heparin infusion at 850 units/hr  Heparin level in 6-8 hrs then daily  CBC daily while on Heparin  [place] Crane, Zeno Hickel A 03/16/2017,10:50 AM

## 2017-03-16 NOTE — ED Triage Notes (Signed)
Pt comes in from home by EMS for shortness of breath. Pt has had shortness of breath for 3 days and it has gotten worse.  EMS got 82% on RA. Pt has had 1 breathing tx in route and is getting a 2nd one now. Oxygen is now 92%, while getting breathing tx.  Denies any pain.

## 2017-03-16 NOTE — ED Provider Notes (Signed)
[facility] DEPT Provider Note   CSN: [id number] Arrival date & time: 03/16/17  4627   History   Chief Complaint Chief Complaint  Patient presents with  . Shortness of Breath    HPI Marie [name] is a 81 y.o. female.  HPI  Patient presents to ED by EMS for shortness of breath. PMH significant for restrictive lung disease followed by pulmonology, recurrent bronchitis, parkinson's syndrome, and recent hospilization for COPD exacerbation. Patient states that she has been having worsening dyspnea for the last 2 days. Her husband called EMS this morning. He states that patient was having a hard time breathing especially while walking. Patient states she has been giving herself her breathing treatments at home without improvement. Patient was discharged from hospitalization on 03-10-2017. She was discharged with steroids, duonebs, and antibiotics. Patient has been taking medications. She denies any chest pain, fevers. She does not wear oxygen at baseline.    Past Medical History:  Diagnosis Date  . Balance disorder 2008.     Falls a lot.  She can be standing and then leans too far over to one side   . Bulging disc   . Chronic bronchitis ([place])    due to congestion at times, on prednisone and advair  . COPD (chronic obstructive pulmonary disease) ([place])   . Depression    many years ago  . GERD (gastroesophageal reflux disease)   . Hearing loss    mild  . Hyperlipidemia   . Hypertension   . Iatrogenic adrenal insufficiency ([place])   . Incontinence    not indicated at this visit.  Marland Kitchen Neuropathy    legs stay numb   . Osteoarthritis    hands,   . Osteoporosis   . Shortness of breath dyspnea   . Tubulovillous adenoma of rectum   . Wears dentures   . Wears glasses     Patient Active Problem List   Diagnosis Date Noted  . Acute respiratory failure ([place]) 03/09/2017  . 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 Restrictive lung disease 02/24/2017  . Chest congestion  02/24/2017  . Dysphagia 01/12/2017  . Murmur 01/07/2017  . LVH (left ventricular hypertrophy) 01/07/2017  . Acute respiratory failure with hypoxia ([place]) 12/28/2016  . Lobar pneumonia ([place]) 12/28/2016  . Nonspecific abnormal electrocardiogram (ECG) (EKG) 12/28/2016  . Hypokalemia 12/28/2016  . Essential hypertension 12/28/2016  . Depression, major, in remission ([place]) 09/22/2016  . At high risk for falls 10/16/2015  . Hypotension   . Asthma 11/12/2014  . Prediabetes 08/23/2014  . Vitamin D deficiency 08/23/2014  . Medication management 08/23/2014  . Unstable Gait 01/30/2014  . Incontinence   . Osteoarthritis   . Hyperlipidemia   . GERD (gastroesophageal reflux disease)   . Hereditary and idiopathic peripheral neuropathy   . Iatrogenic adrenal insufficiency ([place])   . Tubulovillous adenoma of rectum 09/04/2011    Past Surgical History:  Procedure Laterality Date  . ABDOMINAL HYSTERECTOMY    . APPENDECTOMY    . CHONDROPLASTY  09/19/2014   Procedure: CHONDROPLASTY;  Surgeon: Alta Corning, MD;  Location: [facility];  Service: Orthopedics;;  . DILATION AND CURETTAGE OF UTERUS    . EXTERNAL FIXATION LEG  10/25/2012   Procedure: EXTERNAL FIXATION LEG;  Surgeon: Rozanna Box, MD;  Location: [facility];  Service: Orthopedics;  Laterality: Right;  . EYE SURGERY     bilateral cataract surgery and lens implant  . FINGER SURGERY     fusions and debridements for OA  .  INCONTINENCE SURGERY     multiple procedures, not cured  . KNEE ARTHROSCOPY WITH LATERAL MENISECTOMY Right 09/19/2014   Procedure: KNEE ARTHROSCOPY WITH LATERAL MENISECTOMY;  Surgeon: Alta Corning, MD;  Location: [facility];  Service: Orthopedics;  Laterality: Right;  . KNEE ARTHROSCOPY WITH MEDIAL MENISECTOMY Right 09/19/2014   Procedure: RIGHT KNEE ARTHROSCOPY WITH MEDIAL AND LATERAL MENISECTOMIES. CHONDROPLASTY OF PATELLA-FEMORAL JOINT;  Surgeon: Alta Corning, MD;  Location: [facility];  Service: Orthopedics;  Laterality: Right;  . RECTAL BIOPSY  09/21/2011   Procedure: BIOPSY RECTAL;  Surgeon: Merrie Roof, MD;  Location: [facility];  Service: General;  Laterality: N/A;  3-4 cm  . RECTAL SURGERY     by dr. Marlou Starks, removal of polyp  . TONSILLECTOMY      OB History    No data available      Home Medications    Prior to Admission medications   Medication Sig Start Date End Date Taking? Authorizing Provider  ADVAIR DISKUS 250-50 MCG/DOSE AEPB INHALE ONE PUFF BY MOUTH TWICE DAILY 10/08/16   [name], MD  albuterol (PROVENTIL) (2.5 MG/3ML) 0.083% nebulizer solution USE ONE VIAL IN NEBULIZER EVERY 6 HOURS AS NEEDED FOR WHEEZING OR SHORTNESS OF BREATH 11/12/16   Vicie Mutters, PA-C  aspirin 81 MG tablet Take 81 mg by mouth daily. Buys OTC    [provider]  benzonatate (TESSALON) 200 MG capsule Take 1 capsule (200 mg total) by mouth 3 (three) times daily as needed for cough. 01/19/17   Forcucci, Courtney, PA-C  carbidopa-levodopa (SINEMET IR) 25-100 MG tablet Take 1 tablet by mouth 3 (three) times daily. 03/04/17   Narda Amber K, DO  Cholecalciferol (VITAMIN D) 2000 units CAPS Take 2,000 Units by mouth daily.    [provider]  doxycycline (ADOXA) 100 MG tablet Take 1 tablet (100 mg total) by mouth 2 (two) times daily. 03/10/17 03/20/17  Roxan Hockey, MD  furosemide (LASIX) 40 MG tablet Take 40 mg by mouth daily.    [provider]  guaiFENesin (MUCINEX) 600 MG 12 hr tablet Take 2 tablets (1,200 mg total) by mouth 2 (two) times daily. 12/30/16   Rai, Vernelle Emerald, MD  losartan (COZAAR) 50 MG tablet Take 1 tablet (50 mg total) by mouth daily. 03/10/17 03/10/18  Roxan Hockey, MD  montelukast (SINGULAIR) 10 MG tablet Take 10 mg by mouth at bedtime.    [provider]  nystatin (MYCOSTATIN) 100000 UNIT/ML suspension Take 5 mLs (500,000 Units total) by mouth 4 (four) times daily. Patient taking differently: Take 5 mLs by mouth 2  (two) times daily.  12/30/16   Rai, Vernelle Emerald, MD  omeprazole (PRILOSEC) 40 MG capsule Take 1 capsule (40 mg total) by mouth 2 (two) times daily. 12/30/16   Rai, Ripudeep K, MD  potassium chloride (K-DUR) 10 MEQ tablet Take 1 tablet (10 mEq total) by mouth daily. 03/10/17 03/17/17  Roxan Hockey, MD  pravastatin (PRAVACHOL) 40 MG tablet Take 20 mg by mouth daily.    [provider]  sodium chloride HYPERTONIC 3 % nebulizer solution Take 1 vial by nebulization BID 02/24/17   Juanito Doom, MD  traZODone (DESYREL) 50 MG tablet Take 25 mg by mouth at bedtime.    [provider]    Family History Family History  Problem Relation Age of Onset  . Cancer Sister        pt unaware of what kind  . High blood pressure Mother   .  COPD Mother   . Heart attack Father     Social History Social History  Substance Use Topics  . Smoking status: Never Smoker  . Smokeless tobacco: Never Used  . Alcohol use No     Allergies   Ertapenem; Codeine; Demerol; Morphine and related; Other; and Sulfate   Review of Systems Review of Systems All systems reviewed and are negative for acute change except as noted in the HPI.   Physical Exam Updated Vital Signs BP (!) 210/164 (BP Location: Left Arm)   Pulse (!) 102   Temp 97.6 F (36.4 C) (Axillary)   Resp 20   Ht 5\' 4"  (1.626 m)   Wt 56.7 kg (125 lb)   SpO2 95%   BMI 21.46 kg/m   Physical Exam  Constitutional: She is oriented to person, place, and time. She appears well-developed and well-nourished. No distress.  HENT:  Head: Normocephalic and atraumatic.  Eyes: Conjunctivae and EOM are normal.  Neck: Neck supple.  Cardiovascular: Regular rhythm, normal heart sounds and normal pulses.  Tachycardia present.   Pulmonary/Chest: Breath sounds normal. Accessory muscle usage present. Tachypnea noted. She is in respiratory distress. She has no wheezes. She has no rales.  Abdominal: Soft. She exhibits no distension. There is no  tenderness. There is no guarding.  Musculoskeletal:  Trace edema. Increased movement of extremities.   Neurological: She is alert and oriented to person, place, and time. No cranial nerve deficit. She exhibits normal muscle tone.  Skin: Skin is warm and dry.  Psychiatric: She has a normal mood and affect.  Nursing note and vitals reviewed.   ED Treatments / Results  Labs (all labs ordered are listed, but only abnormal results are displayed) Labs Reviewed  COMPREHENSIVE METABOLIC PANEL - Abnormal; Notable for the following:       Result Value   Potassium 3.4 (*)    Chloride 93 (*)    CO2 34 (*)    Glucose, Bld 135 (*)    BUN 49 (*)    Creatinine, Ser 1.54 (*)    Total Bilirubin 1.5 (*)    GFR calc non Af Amer 31 (*)    GFR calc Af Amer 36 (*)    All other components within normal limits  CBC WITH DIFFERENTIAL/PLATELET - Abnormal; Notable for the following:    WBC 14.4 (*)    RBC 5.60 (*)    Hemoglobin 17.2 (*)    HCT 50.8 (*)    Neutro Abs 10.3 (*)    Monocytes Absolute 1.2 (*)    All other components within normal limits  TROPONIN I - Abnormal; Notable for the following:    Troponin I 0.09 (*)    All other components within normal limits    EKG  EKG Interpretation  Date/Time:  Tuesday Mar 16 2017 08:15:34 EDT Ventricular Rate:  97 PR Interval:    QRS Duration: 97 QT Interval:  401 QTC Calculation: 505 R Axis:   -44 Text Interpretation:  Normal sinus rhythm Inferior infarct, old Consider anterior infarct Repol abnrm, severe global ischemia (LM/MVD) Prolonged QT interval Poor baseline Confirmed by Reather Converse MD, JOSHUA [phone]) on 03/16/2017 8:27:10 AM       Radiology Dg Chest Portable 1 View  Result Date: 03/16/2017 CLINICAL DATA:  Short of breath EXAM: PORTABLE CHEST 1 VIEW COMPARISON:  03/09/2017 FINDINGS: Normal heart size. Lungs clear. No pneumothorax. No pleural effusion. IMPRESSION: No active disease. Electronically Signed   By: Marybelle Killings M.D.   On:  03/16/2017 08:00    Procedures Procedures (including critical care time)  Medications Ordered in ED Medications  aspirin tablet 325 mg (not administered)    Initial Impression / Assessment and Plan / ED Course  I have reviewed the triage vital signs and the nursing notes.  Pertinent labs & imaging results that were available during my care of the patient were reviewed by me and considered in my medical decision making (see chart for details).  She presented to ED via EMS for increased shortness of breath. Patient with medical history significant for restrictive lung disease. Was recently discharged from hospital for COPD exacerbation. Patient with recurring worsening symptoms. Patient with hypoxia via EMS to 82%. She was given 2 DuoNeb treatments via EMS with improvement.   On presentation ED oxygen saturation greater than 90%. She had some accessory muscle use and speaking in short sentences. No fevers. CXR without signs of acute process. Blood work with a leukocytosis (steriods vs infection?). Troponin elevated to 0.09.  EKG with some ST depression in anterolateral leads; new changes compared to last. Given 325mg  of ASA. AKI appreciated so this hinders ability to do imaging with contrast. Concern for PE vs NSTEMI. Patient started in heparin. Heart score 6.   Patient ambulated and oxygen saturation dropped to 80%. Patient with new oxygen requirement. Will admit for hypoxia with new oxygen requirement, respiratory distress, AKI, and elevated troponin concerning for NSTEMI.  Final Clinical Impressions(s) / ED Diagnoses   Final diagnoses:  Hypoxia  Shortness of breath  Elevated troponin    New Prescriptions New Prescriptions   No medications on file     Katheren Shams, DO 03/16/17 1045    Elnora Morrison, MD 03/16/17 1521    Elnora Morrison, MD 03/18/17 1000

## 2017-03-16 NOTE — H&P (Addendum)
History and Physical    Marie [name] FTD:[id number] DOB: [birth year] DOA: 03/16/2017  PCP: [name], MD   Patient coming from: Home    Chief Complaint:  Dyspnea and weakness.   HPI: Marie [name] is a 81 y.o. female with medical history significant of COPD with recent hospitalization for an exacerbation, discharged on 5/23 after a 24 hours stay. Since patient reached home she has developed worsening weakness, generalized, worsening to the point where she was symptomatic with minimal efforts, worse with exertion, no improving factors, associated with worsening dyspnea and dry cough as well as wheezing. Denies any lower extremity edema or frank chest pain.  Patient has been compliant with her discharge medical regimen, she has been using her nebulizer machine with no improvement of her current symptoms.  When EMS arrived at her home her oxygen saturation was 82% on room air.  ED Course: Patient was found to be hypoxic on room air, on ambulation she dropped to 80%. She was placed on supplemental oxygen per nasal cannula, given bronchodilator therapy. Started on heparin drip due to suspicion of pulmonary embolism and referred for admission and further evaluation.   Review of Systems:  1. General. Significant weakness and deconditioning as mentioned in history present illness 2. ENT. No runny nose or sore throat 3. Pulmonary. Positive for dyspnea, cough and wheezing as mentioned in history present illness 4. Cardiovascular. No angina, no claudication, no PND orthopnea 5. Gastrointestinal. No nausea vomiting or diarrhea 6. Musculoskeletal no chest pain 7. Dermatology no rashes 8. Urology no dysuria or increased urinary frequency 9. Endocrine, no tremors, heat or cold intolerance 10. Hematology no easy bruisability or frequent infections  Past Medical History:  Diagnosis Date  . Balance disorder 2008.     Falls a lot.  She can be standing and then leans too far over to one  side   . Bulging disc   . Chronic bronchitis ([place])    due to congestion at times, on prednisone and advair  . COPD (chronic obstructive pulmonary disease) ([place])   . Depression    many years ago  . GERD (gastroesophageal reflux disease)   . Hearing loss    mild  . Hyperlipidemia   . Hypertension   . Iatrogenic adrenal insufficiency ([place])   . Incontinence    not indicated at this visit.  Marland Kitchen Neuropathy    legs stay numb   . Osteoarthritis    hands,   . Osteoporosis   . Shortness of breath dyspnea   . Tubulovillous adenoma of rectum   . Wears dentures   . Wears glasses     Past Surgical History:  Procedure Laterality Date  . ABDOMINAL HYSTERECTOMY    . APPENDECTOMY    . CHONDROPLASTY  09/19/2014   Procedure: CHONDROPLASTY;  Surgeon: Alta Corning, MD;  Location: [facility];  Service: Orthopedics;;  . DILATION AND CURETTAGE OF UTERUS    . EXTERNAL FIXATION LEG  10/25/2012   Procedure: EXTERNAL FIXATION LEG;  Surgeon: Rozanna Box, MD;  Location: [facility];  Service: Orthopedics;  Laterality: Right;  . EYE SURGERY     bilateral cataract surgery and lens implant  . FINGER SURGERY     fusions and debridements for OA  . INCONTINENCE SURGERY     multiple procedures, not cured  . KNEE ARTHROSCOPY WITH LATERAL MENISECTOMY Right 09/19/2014   Procedure: KNEE ARTHROSCOPY WITH LATERAL MENISECTOMY;  Surgeon: Alta Corning, MD;  Location: [facility];  Service: Orthopedics;  Laterality: Right;  . KNEE ARTHROSCOPY WITH MEDIAL MENISECTOMY Right 09/19/2014   Procedure: RIGHT KNEE ARTHROSCOPY WITH MEDIAL AND LATERAL MENISECTOMIES. CHONDROPLASTY OF PATELLA-FEMORAL JOINT;  Surgeon: Alta Corning, MD;  Location: [facility];  Service: Orthopedics;  Laterality: Right;  . RECTAL BIOPSY  09/21/2011   Procedure: BIOPSY RECTAL;  Surgeon: Merrie Roof, MD;  Location: [facility];  Service: General;  Laterality: N/A;  3-4 cm  . RECTAL SURGERY     by dr. Marlou Starks, removal  of polyp  . TONSILLECTOMY       reports that she has never smoked. She has never used smokeless tobacco. She reports that she does not drink alcohol or use drugs.  Allergies  Allergen Reactions  . Ertapenem Hives    Caused whole body to turn red Other reaction(s): Redness Caused whole body to turn red  . Codeine Other (See Comments)    hallucinations  . Demerol Other (See Comments)    hallucinations  . Morphine And Related Other (See Comments)    hallucinations  . Other Other (See Comments)    Invan 7- pt became red all over  . Sulfate Other (See Comments)    unknown    Family History  Problem Relation Age of Onset  . Cancer Sister        pt unaware of what kind  . High blood pressure Mother   . COPD Mother   . Heart attack Father      Prior to Admission medications   Medication Sig Start Date End Date Taking? Authorizing Provider  ADVAIR DISKUS 250-50 MCG/DOSE AEPB INHALE ONE PUFF BY MOUTH TWICE DAILY 10/08/16  Yes [name], MD  albuterol (PROVENTIL) (2.5 MG/3ML) 0.083% nebulizer solution USE ONE VIAL IN NEBULIZER EVERY 6 HOURS AS NEEDED FOR WHEEZING OR SHORTNESS OF BREATH 11/12/16  Yes Vicie Mutters, PA-C  aspirin 81 MG tablet Take 81 mg by mouth daily. Buys OTC   Yes [provider]  carbidopa-levodopa (SINEMET IR) 25-100 MG tablet Take 1 tablet by mouth 3 (three) times daily. 03/04/17  Yes Patel, Donika K, DO  Cholecalciferol (VITAMIN D) 2000 units CAPS Take 2,000 Units by mouth daily.   Yes [provider]  doxycycline (ADOXA) 100 MG tablet Take 1 tablet (100 mg total) by mouth 2 (two) times daily. 03/10/17 03/20/17 Yes Emokpae, Courage, MD  furosemide (LASIX) 40 MG tablet Take 20 mg by mouth daily.    Yes [provider]  guaiFENesin (MUCINEX) 600 MG 12 hr tablet Take 2 tablets (1,200 mg total) by mouth 2 (two) times daily. 12/30/16  Yes Rai, Ripudeep K, MD  losartan-hydrochlorothiazide (HYZAAR) 100-25 MG tablet Take 1 tablet by mouth  daily.   Yes [provider]  montelukast (SINGULAIR) 10 MG tablet Take 10 mg by mouth at bedtime.   Yes [provider]  nystatin (MYCOSTATIN) 100000 UNIT/ML suspension Take 5 mLs (500,000 Units total) by mouth 4 (four) times daily. Patient taking differently: Take 5 mLs by mouth 2 (two) times daily.  12/30/16  Yes Rai, Ripudeep K, MD  omeprazole (PRILOSEC) 40 MG capsule Take 1 capsule (40 mg total) by mouth 2 (two) times daily. 12/30/16  Yes Rai, Ripudeep K, MD  potassium chloride (K-DUR) 10 MEQ tablet Take 1 tablet (10 mEq total) by mouth daily. 03/10/17 03/17/17 Yes Emokpae, Courage, MD  pravastatin (PRAVACHOL) 40 MG tablet Take 20 mg by mouth daily.   Yes [provider]  predniSONE (DELTASONE) 10 MG  tablet Take 10 mg by mouth daily with breakfast.   Yes [provider]  sodium chloride HYPERTONIC 3 % nebulizer solution Take 1 vial by nebulization BID 02/24/17  Yes Juanito Doom, MD  traZODone (DESYREL) 50 MG tablet Take 25 mg by mouth at bedtime.   Yes [provider]  benzonatate (TESSALON) 200 MG capsule Take 1 capsule (200 mg total) by mouth 3 (three) times daily as needed for cough. Patient not taking: Reported on 03/16/2017 01/19/17   Forcucci, Loma Sousa, PA-C  losartan (COZAAR) 50 MG tablet Take 1 tablet (50 mg total) by mouth daily. Patient not taking: Reported on 03/16/2017 03/10/17 03/10/18  Roxan Hockey, MD    Physical Exam: Vitals:   03/16/17 0900 03/16/17 0915 03/16/17 0930 03/16/17 1030  BP: 104/71 91/67 102/80   Pulse:  91 98   Resp: 20 19 18 18   Temp:      TempSrc:      SpO2: 96% 95% 92%   Weight:      Height:        Constitutional: deconditioned and ill looking appearing Vitals:   03/16/17 0900 03/16/17 0915 03/16/17 0930 03/16/17 1030  BP: 104/71 91/67 102/80   Pulse:  91 98   Resp: 20 19 18 18   Temp:      TempSrc:      SpO2: 96% 95% 92%   Weight:      Height:       Eyes: PERRL, lids and conjunctivae mild pallor  but no icterus.  ENMT: Mucous membranes are dry. Posterior pharynx clear of any exudate or lesions.Normal dentition.  Neck: normal, supple, no masses, no thyromegaly Respiratory: Decreased breath sounds bilaterally with decreased ventuilation, no significant wheezing or rhonchi, scattered basal crackles. Normal respiratory effort. No accessory muscle use.  Cardiovascular: Regular rate and rhythm, no / rubs / gallops. Lower extremity with trace edema, bilateral non pitting. 2+ pedal pulses. No carotid bruits. Positive murmur at the apex, systolic, 3/6, radiated to the axilla.  Abdomen: no tenderness, no masses palpated. No hepatosplenomegaly. Bowel sounds positive.  Musculoskeletal: no clubbing / cyanosis. No joint deformity upper and lower extremities. Good ROM, no contractures. Normal muscle tone.  Skin: no rashes, lesions, ulcers. No induration Neurologic: CN 2-12 grossly intact. Sensation intact, DTR normal. Strength 5/5 in all 4.     Labs on Admission: I have personally reviewed following labs and imaging studies  CBC:  Recent Labs Lab 03/10/17 0513 03/16/17 0917  WBC 11.9* 14.4*  NEUTROABS  --  10.3*  HGB 14.8 17.2*  HCT 45.1 50.8*  MCV 92.0 90.7  PLT 253 867   Basic Metabolic Panel:  Recent Labs Lab 03/10/17 0513 03/16/17 0917  NA 144 139  K 3.4* 3.4*  CL 108 93*  CO2 25 34*  GLUCOSE 183* 135*  BUN 24* 49*  CREATININE 1.03* 1.54*  CALCIUM 9.0 9.2   GFR: Estimated Creatinine Clearance: 26 mL/min (A) (by C-G formula based on SCr of 1.54 mg/dL (H)). Liver Function Tests:  Recent Labs Lab 03/16/17 0917  AST 32  ALT 28  ALKPHOS 47  BILITOT 1.5*  PROT 6.8  ALBUMIN 4.1   No results for input(s): LIPASE, AMYLASE in the last 168 hours. No results for input(s): AMMONIA in the last 168 hours. Coagulation Profile: No results for input(s): INR, PROTIME in the last 168 hours. Cardiac Enzymes:  Recent Labs Lab 03/16/17 0917  TROPONINI 0.09*   BNP (last 3  results) No results for input(s): PROBNP in  the last 8760 hours. HbA1C: No results for input(s): HGBA1C in the last 72 hours. CBG: No results for input(s): GLUCAP in the last 168 hours. Lipid Profile: No results for input(s): CHOL, HDL, LDLCALC, TRIG, CHOLHDL, LDLDIRECT in the last 72 hours. Thyroid Function Tests: No results for input(s): TSH, T4TOTAL, FREET4, T3FREE, THYROIDAB in the last 72 hours. Anemia Panel: No results for input(s): VITAMINB12, FOLATE, FERRITIN, TIBC, IRON, RETICCTPCT in the last 72 hours. Urine analysis:    Component Value Date/Time   COLORURINE YELLOW 02/17/2017 [place] 02/17/2017 1229   LABSPEC 1.015 02/17/2017 1229   PHURINE 6.5 02/17/2017 1229   GLUCOSEU NEGATIVE 02/17/2017 1229   HGBUR TRACE (A) 02/17/2017 1229   BILIRUBINUR NEGATIVE 02/17/2017 1229   KETONESUR NEGATIVE 02/17/2017 1229   PROTEINUR NEGATIVE 02/17/2017 1229   UROBILINOGEN 0.2 04/16/2015 2310   NITRITE NEGATIVE 02/17/2017 1229   LEUKOCYTESUR NEGATIVE 02/17/2017 1229    Radiological Exams on Admission: Dg Chest Portable 1 View  Result Date: 03/16/2017 CLINICAL DATA:  Short of breath EXAM: PORTABLE CHEST 1 VIEW COMPARISON:  03/09/2017 FINDINGS: Normal heart size. Lungs clear. No pneumothorax. No pleural effusion. IMPRESSION: No active disease. Electronically Signed   By: Marybelle Killings M.D.   On: 03/16/2017 08:00    EKG: Independently reviewed. Normal sinus rhythm, at 97 bpm, premature atrial complexes, left axis deviation, poor R-wave progression, ST depressions in V5 through V6, Q waves in V3, loss R-wave in V4 compared to prior EKGs, positive baseline interference.  Assessment/Plan Active Problems:   COPD (chronic obstructive pulmonary disease) with acute bronchitis ([place])   This is a 81 year old female with diagnosis of COPD, recent hospitalization, presents with worsening weakness, associated with dyspnea and wheezing. The main symptom she complains currently is  generalized weakness. She has been compliant with her medications, her symptoms have been persistent despite the use of bronchodilator therapy at home. On the physical examination she looks deconditioning, blood pressure 104/71, heart rate 91, respiratory 20, oxygen saturation 96% of supplemental oxygen per nasal cannula. She has a significant decreased breath sounds bilaterally, no wheezing, heart S1-S2 present rhythmic, positive systolic murmur at the apex radiating to axilla, abdomen soft, trace lower extremity edema. Sodium 139, potassium 3.4, chloride 93, bicarbonate 24, glucose 135, BUN 49, creatinine 1.54, troponin 0.09, her chest x-ray personally reviewed, is well penetrated, good inspiration, rotated left side, increased pulmonary artery, and increased lung markings bilaterally aren't changed from May 22.  Working diagnosis, acute hypoxic respiratory failure due to decompensated COPD complicated by acute kidney injury, MI type 2, to rule out pulmonary embolism.  1. Hypoxic respiratory failure due to COPD exacerbation. Patient will be admitted to the medical unit with a remote telemetry monitor, she will be placed on supplemental oxygen per nasal cannula, continue oximetry monitoring. Target O2 saturation more than 92%. Continue bronchodilator therapy with DuoNeb every 6 hours and as needed q  2 hours. Antibiotic therapy with azithromycin for 5 days for airway inflammation. Systemic steroids with Solu-Medrol 40 mg daily. Physical therapy evaluation, patient may need pulmonary rehabilitation by the time of discharge. Old records personally reviewed, pulmonary function testing from February 2018, with a nonobstructive pattern,  FEV1/FVC ratio of 83 with an FEV1 of 65, with a DLCO 46. Will check a d-dimer if positive will proceed with VQ scan, considering elevated creatinine will avoid contrast exposure. Continue Advair.  2. MI type II. Suspected demand ischemia due to acute hypoxic respiratory failure,  will continue to trend cardiac enzymes  and do serial EKGs. Admission 12-lead with loss of R-wave in V4 and lateral ST depressions. Echocardiogram from February 2018 with a normal systolic LV function, reported dynamic systolic obstruction. The PA pressures were reportedly normal.  3. Acute kidney injury. Suspected to be prerenal related to poor oral intake associated with acute illness, patient will receive gentle hydration with isotonic saline, follow-up kidney function in the morning, avoid hypotension and nephrotoxic agents. Hold furosemide.  4. Parkinsonism. Continue carbidopa/levodopa.  5. Hypertension. Will hydrate patient with isotonic saline, hold losartan and hydrochlorothiazide to avoid further hypotension or kidney injury.  6. Depression. Continue trazodone. No agitation or confusion.  Late entry: positive d dimer, will continue IV heparin and will plan to do CT chest in am once renal function improves.   DVT prophylaxis:  Heparin  Code Status: Full  Family Communication: Disposition Plan: Patient's husband at the bedside, all questions were addressed.  Consults called:  Admission status: Inpatient    Nicolemarie Wooley Gerome Apley MD [facility] Pager [phone]  If 7PM-7AM, please contact night-coverage [email or web address] Password [facility]  03/16/2017, 11:23 AM

## 2017-03-16 NOTE — ED Notes (Signed)
CRITICAL VALUE ALERT  Critical Value:  Troponin 0.09  Date & Time Notified:  03/16/17 at 1000  Provider Notified: Gerarda Fraction  Orders Received/Actions taken: MD notified

## 2017-03-16 NOTE — ED Notes (Signed)
Hospitalist made aware of BP and told to give pt 500 bolus and re-evaluate before sending to the floor.

## 2017-03-16 NOTE — ED Notes (Signed)
MD made aware of BP

## 2017-03-16 NOTE — ED Notes (Signed)
Pt was only able to walk a few steps in her room before stating "I don't think I can go anymore". She was unsteady when ambulating and appeared to have increased work of breathing. O2 saturation dropped down to 80. RN notified.

## 2017-03-16 NOTE — ED Notes (Signed)
Lab at bedside

## 2017-03-16 NOTE — Progress Notes (Signed)
Dr. Cathlean Sauer notified via text page of patient's DDimer.

## 2017-03-17 ENCOUNTER — Inpatient Hospital Stay (HOSPITAL_COMMUNITY): Payer: Medicare Other

## 2017-03-17 ENCOUNTER — Ambulatory Visit: Payer: Self-pay | Admitting: Internal Medicine

## 2017-03-17 DIAGNOSIS — J984 Other disorders of lung: Secondary | ICD-10-CM

## 2017-03-17 DIAGNOSIS — I10 Essential (primary) hypertension: Secondary | ICD-10-CM

## 2017-03-17 DIAGNOSIS — I248 Other forms of acute ischemic heart disease: Secondary | ICD-10-CM

## 2017-03-17 DIAGNOSIS — E43 Unspecified severe protein-calorie malnutrition: Secondary | ICD-10-CM | POA: Insufficient documentation

## 2017-03-17 DIAGNOSIS — G232 Striatonigral degeneration: Secondary | ICD-10-CM

## 2017-03-17 DIAGNOSIS — N179 Acute kidney failure, unspecified: Secondary | ICD-10-CM

## 2017-03-17 DIAGNOSIS — K219 Gastro-esophageal reflux disease without esophagitis: Secondary | ICD-10-CM

## 2017-03-17 LAB — COMPREHENSIVE METABOLIC PANEL
ALT: 8 U/L — ABNORMAL LOW (ref 14–54)
ANION GAP: 9 (ref 5–15)
AST: 30 U/L (ref 15–41)
Albumin: 3 g/dL — ABNORMAL LOW (ref 3.5–5.0)
Alkaline Phosphatase: 39 U/L (ref 38–126)
BUN: 33 mg/dL — ABNORMAL HIGH (ref 6–20)
CALCIUM: 8.2 mg/dL — AB (ref 8.9–10.3)
CO2: 26 mmol/L (ref 22–32)
CREATININE: 0.99 mg/dL (ref 0.44–1.00)
Chloride: 103 mmol/L (ref 101–111)
GFR, EST NON AFRICAN AMERICAN: 53 mL/min — AB (ref >60)
Glucose, Bld: 169 mg/dL — ABNORMAL HIGH (ref 65–99)
Potassium: 2.9 mmol/L — ABNORMAL LOW (ref 3.5–5.1)
SODIUM: 138 mmol/L (ref 135–145)
TOTAL PROTEIN: 5.1 g/dL — AB (ref 6.5–8.1)
Total Bilirubin: 1.1 mg/dL (ref 0.3–1.2)

## 2017-03-17 LAB — CBC
HCT: 43 % (ref 36.0–46.0)
Hemoglobin: 14.1 g/dL (ref 12.0–15.0)
MCH: 29.9 pg (ref 26.0–34.0)
MCHC: 32.8 g/dL (ref 30.0–36.0)
MCV: 91.1 fL (ref 78.0–100.0)
PLATELETS: 177 10*3/uL (ref 150–400)
RBC: 4.72 MIL/uL (ref 3.87–5.11)
RDW: 14.2 % (ref 11.5–15.5)
WBC: 4.9 10*3/uL (ref 4.0–10.5)

## 2017-03-17 LAB — HEPARIN LEVEL (UNFRACTIONATED): HEPARIN UNFRACTIONATED: 0.75 [IU]/mL — AB (ref 0.30–0.70)

## 2017-03-17 LAB — TROPONIN I: Troponin I: 0.06 ng/mL (ref <0.03)

## 2017-03-17 MED ORDER — METHYLPREDNISOLONE SODIUM SUCC 40 MG IJ SOLR
40.0000 mg | Freq: Two times a day (BID) | INTRAMUSCULAR | Status: DC
  Administered 2017-03-17 – 2017-03-18 (×3): 40 mg via INTRAVENOUS
  Filled 2017-03-17 (×3): qty 1

## 2017-03-17 MED ORDER — MAGNESIUM SULFATE 2 GM/50ML IV SOLN
2.0000 g | Freq: Once | INTRAVENOUS | Status: AC
  Administered 2017-03-17: 2 g via INTRAVENOUS
  Filled 2017-03-17: qty 50

## 2017-03-17 MED ORDER — HEPARIN SODIUM (PORCINE) 5000 UNIT/ML IJ SOLN
5000.0000 [IU] | Freq: Three times a day (TID) | INTRAMUSCULAR | Status: DC
  Administered 2017-03-17 – 2017-03-19 (×5): 5000 [IU] via SUBCUTANEOUS
  Filled 2017-03-17 (×5): qty 1

## 2017-03-17 MED ORDER — NITROGLYCERIN 0.4 MG SL SUBL
0.4000 mg | SUBLINGUAL_TABLET | SUBLINGUAL | Status: DC | PRN
  Administered 2017-03-17: 0.4 mg via SUBLINGUAL
  Filled 2017-03-17: qty 1

## 2017-03-17 MED ORDER — POTASSIUM CHLORIDE CRYS ER 20 MEQ PO TBCR
40.0000 meq | EXTENDED_RELEASE_TABLET | ORAL | Status: AC
  Administered 2017-03-17 (×2): 40 meq via ORAL
  Filled 2017-03-17 (×2): qty 2

## 2017-03-17 MED ORDER — DOXYCYCLINE HYCLATE 100 MG PO TABS
100.0000 mg | ORAL_TABLET | Freq: Two times a day (BID) | ORAL | Status: DC
  Administered 2017-03-17 – 2017-03-19 (×4): 100 mg via ORAL
  Filled 2017-03-17 (×4): qty 1

## 2017-03-17 NOTE — Progress Notes (Signed)
Initial Nutrition Assessment  DOCUMENTATION CODES:  Severe malnutrition in context of chronic illness  INTERVENTION:  Continue Ensure Enlive po BID, each supplement provides 350 kcal and 20 grams of protein  Meal preferences  NUTRITION DIAGNOSIS:  Malnutrition (Severe) related to poor appetite, chronic illness (worsening COPD) as evidenced by loss of >10% bw x 6 months and severe depletion of body fat.  GOAL:  Patient will meet greater than or equal to 90% of their needs  MONITOR:  PO intake, Supplement acceptance, Labs, Weight trends  REASON FOR ASSESSMENT:  Malnutrition Screening Tool    ASSESSMENT:  81 y/o female PMHx COPD,  Depression/Anxiety, GERD, Parkinsonism, HTN. Had recent bried hospitalization (discharged 5/26) for copd exacerbation. Since returned home, has had worsening weakness to point where she becomes dyspneic with minimal effort. Worked up for hypoxia, suspicion for PE, AKI. MI type II. Admitted for management   Spoke with pt and husband. She reports that her appetite has been poor recently due to her weakness. Her meal pattern is the same as last RD assessment that was done 1 week ago; she eats only a few nabs for breakfst, a sandwich for lunch and a hot meal for dinner (prepared by husband). She took a mvi at home.   No n/v/c/d.   Asking for husbands perspective, he believes her appetite has worsened in the past 6 months and her SOB has increased.   When asked for UBW, she stated the most she ever weighed was 175 lbs. Per chart review, she has lost 20-25 lbs in the last 6 months. This is a loss of >10% and clinically significant.   RD stated to pt and husband that her increased difficulty breathing is likely causing her to expend more Kcals as well as taking away her appetite. She needs to eat more then 2x a day. We discussed appropriateness of supplements. She was agreeable to Ensure. Already has these ordered. Took other meal prefs while admitted  Physical  Exam: mod-severe fat depletion, moderate muscle depletion.   Labs: Glu:1.69, K:2.9, Albumin: 3.0,  Medications: Abx, Ensure, Vit D, Methylprednisolone, ppi, IVF   Recent Labs Lab 03/16/17 0917 03/17/17 0456  NA 139 138  K 3.4* 2.9*  CL 93* 103  CO2 34* 26  BUN 49* 33*  CREATININE 1.54* 0.99  CALCIUM 9.2 8.2*  GLUCOSE 135* 169*   Diet Order:  Diet Heart Room service appropriate? Yes; Fluid consistency: Thin  Skin:  Reviewed, no issues  Last BM:  5/28  Height:  Ht Readings from Last 1 Encounters:  03/16/17 5\' 4"  (1.626 m)   Weight:  Wt Readings from Last 1 Encounters:  03/16/17 140 lb 9.6 oz (63.8 kg)   Wt Readings from Last 10 Encounters:  03/16/17 140 lb 9.6 oz (63.8 kg)  03/09/17 145 lb (65.8 kg)  03/04/17 145 lb (65.8 kg)  02/24/17 145 lb 12.8 oz (66.1 kg)  02/17/17 149 lb (67.6 kg)  01/19/17 146 lb (66.2 kg)  01/12/17 151 lb (68.5 kg)  01/07/17 147 lb 8 oz (66.9 kg)  01/01/17 149 lb 7 oz (67.8 kg)  12/30/16 150 lb 11.2 oz (68.4 kg)   Ideal Body Weight:  54.54 kg  BMI:  Body mass index is 24.13 kg/m.  Estimated Nutritional Needs:  Kcal:  1650-1850 (26-29 kcal/kg bw)  Protein:  75-90g Pro (1.2-1.4 g/kg bw) Fluid:  >1/6 L (25 ml ml/kg)  EDUCATION NEEDS:  Education needs addressed  Burtis Junes RD, LDN, CNSC Clinical Nutrition Pager: [phone] 03/17/2017  10:49 AM

## 2017-03-17 NOTE — Progress Notes (Signed)
PROGRESS NOTE    Marie [name]  SWN:[id number] DOB: [birth year] DOA: 03/16/2017 PCP: [name], MD    Brief Narrative:  81 y/o female with a history of restrictive lung disease, admitted to the hospital with acute respiratory failure, dehydration and generalized weakness. Started on intravenous steroids, antibiotics and bronchodilators for possible acute bronchitis superimposed on restrictive lung disease. She was IV fluids for acute kidney injury. She did have some demand ischemia with elevation of troponin. Cardiology consulted.   Assessment & Plan:   Active Problems:   GERD (gastroesophageal reflux disease)   Essential hypertension   Restrictive lung disease   Parkinson's plus syndrome ([place])   Acute respiratory failure (HCC)   Protein-calorie malnutrition, severe   AKI (acute kidney injury) ([place])   Demand ischemia ([place])   1. Acute respiratory failure with hypoxia. Likely related to underlying restrictive lung disease. May still have an element of acute bronchitis. Recent pulmonary function tests did not indicate any element of COPD . she's been started on supplemental oxygen. Intravenous steroids and antibiotics. Continue neb treatments. Overall breathing is improving. Chest x-ray did not show any evidence of pneumonia. VQ scan negative for pulmonary embolus. Chest x-ray did not show any evidence of pneumonia. VQ scan negative for pulmonary embolus. 2. Restrictive lung disease. Followed by Dr. [place] Bells. Continue on bronchodilators and IV steroids. 3. Acute kidney injury. Suspect this is related to dehydration and decreased by mouth intake. Improving with IV fluids. 4. Demand ischemia. Mild elevation of troponin on admission, since trending down. EKG did show some mild ST depressions in the anterolateral leads. He did have some chest discomfort today. Request cardiology consult. 5. Parkinson's plus syndrome. Continue on Sinemet. 6. GERD. Continue on PPI 7. Hypertension. Blood  pressure is currently stable. 8. Hypokalemia. Replace. Check magnesium.   DVT prophylaxis: heparin Code Status: full code Family Communication: discussed with husband at the bedside Disposition Plan: discharge home once improved   Consultants:     Procedures:     Antimicrobials:   Doxycycline 5/30>>    Subjective: Shortness of breath improving, mild cough  Objective: Vitals:   03/17/17 1249 03/17/17 1300 03/17/17 1411 03/17/17 1430  BP: (!) 116/48 (!) 104/44 (!) 95/51   Pulse: 79 88 73   Resp: (!) 22  16   Temp:   98.2 F (36.8 C)   TempSrc:   Oral   SpO2: 96%  100% 97%  Weight:      Height:        Intake/Output Summary (Last 24 hours) at 03/17/17 1519 Last data filed at 03/17/17 1400  Gross per 24 hour  Intake          3011.22 ml  Output                0 ml  Net          3011.22 ml   Filed Weights   03/16/17 0727 03/16/17 1603  Weight: 56.7 kg (125 lb) 63.8 kg (140 lb 9.6 oz)    Examination:  General exam: Appears calm and comfortable  Respiratory system: mild wheeze bilaterally. Respiratory effort normal. Cardiovascular system: S1 & S2 heard, RRR. No JVD, murmurs, rubs, gallops or clicks. No pedal edema. Gastrointestinal system: Abdomen is nondistended, soft and nontender. No organomegaly or masses felt. Normal bowel sounds heard. Central nervous system: Alert and oriented. No focal neurological deficits. Extremities: Symmetric 5 x 5 power. Skin: No rashes, lesions or ulcers Psychiatry: Judgement and insight appear normal. Mood & affect  appropriate.     Data Reviewed: I have personally reviewed following labs and imaging studies  CBC:  Recent Labs Lab 03/16/17 0917 03/17/17 0456  WBC 14.4* 4.9  NEUTROABS 10.3*  --   HGB 17.2* 14.1  HCT 50.8* 43.0  MCV 90.7 91.1  PLT 224 119   Basic Metabolic Panel:  Recent Labs Lab 03/16/17 0917 03/17/17 0456  NA 139 138  K 3.4* 2.9*  CL 93* 103  CO2 34* 26  GLUCOSE 135* 169*  BUN 49* 33*    CREATININE 1.54* 0.99  CALCIUM 9.2 8.2*   GFR: Estimated Creatinine Clearance: 40.4 mL/min (by C-G formula based on SCr of 0.99 mg/dL). Liver Function Tests:  Recent Labs Lab 03/16/17 0917 03/17/17 0456  AST 32 30  ALT 28 8*  ALKPHOS 47 39  BILITOT 1.5* 1.1  PROT 6.8 5.1*  ALBUMIN 4.1 3.0*   No results for input(s): LIPASE, AMYLASE in the last 168 hours. No results for input(s): AMMONIA in the last 168 hours. Coagulation Profile: No results for input(s): INR, PROTIME in the last 168 hours. Cardiac Enzymes:  Recent Labs Lab 03/16/17 0917 03/16/17 1632 03/16/17 2127 03/17/17 0456  TROPONINI 0.09* 0.11* 0.09* 0.06*   BNP (last 3 results) No results for input(s): PROBNP in the last 8760 hours. HbA1C: No results for input(s): HGBA1C in the last 72 hours. CBG: No results for input(s): GLUCAP in the last 168 hours. Lipid Profile: No results for input(s): CHOL, HDL, LDLCALC, TRIG, CHOLHDL, LDLDIRECT in the last 72 hours. Thyroid Function Tests: No results for input(s): TSH, T4TOTAL, FREET4, T3FREE, THYROIDAB in the last 72 hours. Anemia Panel: No results for input(s): VITAMINB12, FOLATE, FERRITIN, TIBC, IRON, RETICCTPCT in the last 72 hours. Sepsis Labs: No results for input(s): PROCALCITON, LATICACIDVEN in the last 168 hours.  No results found for this or any previous visit (from the past 240 hour(s)).       Radiology Studies: Nm Pulmonary Perf And Vent  Result Date: 03/16/2017 CLINICAL DATA:  Acute onset of shortness of breath and cough. Initial encounter. EXAM: NUCLEAR MEDICINE VENTILATION - PERFUSION LUNG SCAN TECHNIQUE: Ventilation images were obtained in multiple projections using inhaled aerosol Tc-103m DTPA. Perfusion images were obtained in multiple projections after intravenous injection of Tc-47m MAA. RADIOPHARMACEUTICALS:  30 mCi Technetium-14m DTPA aerosol inhalation and 4.3 mCi Technetium-24m MAA IV COMPARISON:  Chest radiograph performed earlier today  at 7:44 a.m. FINDINGS: Ventilation: There are multiple large ventilation defects throughout both lungs, suspicious for underlying emphysema. There is suggestion of artifactual accumulation of activity along the tracheobronchial tree. Perfusion: No significant wedge shaped peripheral perfusion defects to suggest acute pulmonary embolism. Perfusion within both lungs is somewhat heterogeneous in appearance, with small subsegmental filling defects noted bilaterally. IMPRESSION: Low probability for pulmonary embolus. Significantly suboptimal ventilation images raises question for underlying emphysema. Electronically Signed   By: Garald Balding M.D.   On: 03/16/2017 23:44   US Venous Img Lower Bilateral  Result Date: 03/17/2017 CLINICAL DATA:  Hypoxia EXAM: BILATERAL LOWER EXTREMITY VENOUS DUPLEX ULTRASOUND TECHNIQUE: Gray-scale sonography with graded compression, as well as color Doppler and duplex ultrasound were performed to evaluate the lower extremity deep venous systems from the level of the common femoral vein and including the common femoral, femoral, profunda femoral, popliteal and calf veins including the posterior tibial, peroneal and gastrocnemius veins when visible. The superficial great saphenous vein was also interrogated. Spectral Doppler was utilized to evaluate flow at rest and with distal augmentation maneuvers in the common femoral,  femoral and popliteal veins. COMPARISON:  None. FINDINGS: RIGHT LOWER EXTREMITY Common Femoral Vein: No evidence of thrombus. Normal compressibility, respiratory phasicity and response to augmentation. Saphenofemoral Junction: No evidence of thrombus. Normal compressibility and flow on color Doppler imaging. Profunda Femoral Vein: No evidence of thrombus. Normal compressibility and flow on color Doppler imaging. Femoral Vein: No evidence of thrombus. Normal compressibility, respiratory phasicity and response to augmentation. Popliteal Vein: No evidence of thrombus.  Normal compressibility, respiratory phasicity and response to augmentation. Calf Veins: No evidence of thrombus. Normal compressibility and flow on color Doppler imaging. Superficial Great Saphenous Vein: No evidence of thrombus. Normal compressibility and flow on color Doppler imaging. Venous Reflux:  None. Other Findings:  None. LEFT LOWER EXTREMITY Common Femoral Vein: No evidence of thrombus. Normal compressibility, respiratory phasicity and response to augmentation. Saphenofemoral Junction: No evidence of thrombus. Normal compressibility and flow on color Doppler imaging. Profunda Femoral Vein: No evidence of thrombus. Normal compressibility and flow on color Doppler imaging. Femoral Vein: No evidence of thrombus. Normal compressibility, respiratory phasicity and response to augmentation. Popliteal Vein: No evidence of thrombus. Normal compressibility, respiratory phasicity and response to augmentation. Calf Veins: No evidence of thrombus. Normal compressibility and flow on color Doppler imaging. Superficial Great Saphenous Vein: No evidence of thrombus. Normal compressibility and flow on color Doppler imaging. Venous Reflux:  None. Other Findings:  None. IMPRESSION: No evidence of deep venous thrombosis within either lower extremity. Electronically Signed   By: Lowella Grip III M.D.   On: 03/17/2017 09:07   Dg Chest Portable 1 View  Result Date: 03/16/2017 CLINICAL DATA:  Short of breath EXAM: PORTABLE CHEST 1 VIEW COMPARISON:  03/09/2017 FINDINGS: Normal heart size. Lungs clear. No pneumothorax. No pleural effusion. IMPRESSION: No active disease. Electronically Signed   By: Marybelle Killings M.D.   On: 03/16/2017 08:00        Scheduled Meds: . aspirin EC  81 mg Oral Daily  . carbidopa-levodopa  1 tablet Oral TID  . cholecalciferol  2,000 Units Oral Daily  . doxycycline  100 mg Oral Q12H  . feeding supplement (ENSURE ENLIVE)  237 mL Oral BID BM  . guaiFENesin  1,200 mg Oral BID  . heparin  subcutaneous  5,000 Units Subcutaneous Q8H  . ipratropium-albuterol  3 mL Nebulization Q6H  . mouth rinse  15 mL Mouth Rinse BID  . methylPREDNISolone (SOLU-MEDROL) injection  40 mg Intravenous Q12H  . mometasone-formoterol  2 puff Inhalation BID  . montelukast  10 mg Oral QHS  . pantoprazole  40 mg Oral Daily  . potassium chloride  40 mEq Oral Q3H  . pravastatin  20 mg Oral q1800  . sodium chloride flush  3 mL Intravenous Q12H  . sodium chloride flush  3 mL Intravenous Q12H  . traZODone  25 mg Oral QHS   Continuous Infusions: . sodium chloride    . sodium chloride 75 mL/hr at 03/17/17 0744     LOS: 1 day    Time spent: 53mins    Meriel Kelliher, MD [facility] Pager [phone]  If 7PM-7AM, please contact night-coverage [email or web address] Password [email or web address] 03/17/2017, 3:19 PM

## 2017-03-17 NOTE — Care Management Note (Signed)
Case Management Note  Patient Details  Name: Marie [name] MRN: [id number] Date of Birth: 09-Feb-1936  Subjective/Objective:                  Pt admitted with COPD/bronchitis, elevated troponins, elevated d-dimer. Pt from home, lives with husband and is ind with ADL's. She has PCP, transportation and insurance with drug coverage. Pt has no HH, no supplemental oxygen or other DME pta. Pt planning to return home at DC. Communicates no needs.   Action/Plan: Anticipate DC home. CM will cont to follow. ? Need for HH, ? Need for home O2 assessment.   Expected Discharge Date:     03/20/2017             Expected Discharge Plan:  Home/Self Care  In-House Referral:  NA  Discharge planning Services  CM Consult  Post Acute Care Choice:  NA Choice offered to:  NA  Status of Service:  In process, will continue to follow  Sherald Barge, RN 03/17/2017, 9:36 AM

## 2017-03-17 NOTE — Progress Notes (Signed)
ANTICOAGULATION CONSULT NOTE -  Pharmacy Consult for HEPARIN Indication: PE  Allergies  Allergen Reactions  . Ertapenem Hives    Caused whole body to turn red Other reaction(s): Redness Caused whole body to turn red  . Codeine Other (See Comments)    hallucinations  . Demerol Other (See Comments)    hallucinations  . Morphine And Related Other (See Comments)    hallucinations  . Other Other (See Comments)    Invan 7- pt became red all over  . Sulfate Other (See Comments)    unknown   Patient Measurements: Height: 5\' 4"  (162.6 cm) Weight: 140 lb 9.6 oz (63.8 kg) IBW/kg (Calculated) : 54.7 HEPARIN DW (KG): 63.8  Vital Signs: Temp: 97.6 F (36.4 C) (05/30 0622) Temp Source: Oral (05/30 0622) BP: 112/51 (05/30 0622) Pulse Rate: 92 (05/30 0622)  Labs:  Recent Labs  03/16/17 0917 03/16/17 1105 03/16/17 1632 03/16/17 2127 03/17/17 0456  HGB 17.2*  --   --   --  14.1  HCT 50.8*  --   --   --  43.0  PLT 224  --   --   --  177  HEPARINUNFRC  --  <0.10*  --  0.29* 0.75*  CREATININE 1.54*  --   --   --  0.99  TROPONINI 0.09*  --  0.11* 0.09* 0.06*   Estimated Creatinine Clearance: 40.4 mL/min (by C-G formula based on SCr of 0.99 mg/dL).  Medical History: Past Medical History:  Diagnosis Date  . Balance disorder 2008.     Falls a lot.  She can be standing and then leans too far over to one side   . Bulging disc   . Chronic bronchitis ([place])    due to congestion at times, on prednisone and advair  . COPD (chronic obstructive pulmonary disease) ([place])   . Depression    many years ago  . GERD (gastroesophageal reflux disease)   . Hearing loss    mild  . Hyperlipidemia   . Hypertension   . Iatrogenic adrenal insufficiency ([place])   . Incontinence    not indicated at this visit.  [place] Neuropathy    legs stay numb   . Osteoarthritis    hands,   . Osteoporosis   . Shortness of breath dyspnea   . Tubulovillous adenoma of rectum   . Wears dentures   . Wears glasses     Medications:  Prescriptions Prior to Admission  Medication Sig Dispense Refill Last Dose  . ADVAIR DISKUS 250-50 MCG/DOSE AEPB INHALE ONE PUFF BY MOUTH TWICE DAILY 60 each 1 03/08/2017 at Unknown time  . albuterol (PROVENTIL) (2.5 MG/3ML) 0.083% nebulizer solution USE ONE VIAL IN NEBULIZER EVERY 6 HOURS AS NEEDED FOR WHEEZING OR SHORTNESS OF BREATH 75 mL 4 03/15/2017 at Unknown time  . aspirin 81 MG tablet Take 81 mg by mouth daily. Buys OTC   Past Week at Unknown time  . carbidopa-levodopa (SINEMET IR) 25-100 MG tablet Take 1 tablet by mouth 3 (three) times daily. 90 tablet 5 Past Week at Unknown time  . Cholecalciferol (VITAMIN D) 2000 units CAPS Take 2,000 Units by mouth daily.   Past Week at Unknown time  . doxycycline (ADOXA) 100 MG tablet Take 1 tablet (100 mg total) by mouth 2 (two) times daily. 20 tablet 0 Past Week at Unknown time  . furosemide (LASIX) 40 MG tablet Take 20 mg by mouth daily.    Past Week at Unknown time  . guaiFENesin (MUCINEX) 600  MG 12 hr tablet Take 2 tablets (1,200 mg total) by mouth 2 (two) times daily. 30 tablet 0 03/08/2017 at Unknown time  . losartan-hydrochlorothiazide (HYZAAR) 100-25 MG tablet Take 1 tablet by mouth daily.   Past Week at Unknown time  . montelukast (SINGULAIR) 10 MG tablet Take 10 mg by mouth at bedtime.   Past Week at Unknown time  . nystatin (MYCOSTATIN) 100000 UNIT/ML suspension Take 5 mLs (500,000 Units total) by mouth 4 (four) times daily. (Patient taking differently: Take 5 mLs by mouth 2 (two) times daily. ) 240 mL 3 03/08/2017 at Unknown time  . omeprazole (PRILOSEC) 40 MG capsule Take 1 capsule (40 mg total) by mouth 2 (two) times daily. 60 capsule 4 Past Week at Unknown time  . potassium chloride (K-DUR) 10 MEQ tablet Take 1 tablet (10 mEq total) by mouth daily. 7 tablet 0 Past Month at Unknown time  . pravastatin (PRAVACHOL) 40 MG tablet Take 20 mg by mouth daily.   Past Week at Unknown time  . predniSONE (DELTASONE) 10 MG tablet Take  10 mg by mouth daily with breakfast.   Past Week at Unknown time  . sodium chloride HYPERTONIC 3 % nebulizer solution Take 1 vial by nebulization BID 240 mL 11 03/08/2017 at Unknown time  . traZODone (DESYREL) 50 MG tablet Take 25 mg by mouth at bedtime.   Past Week at Unknown time  . benzonatate (TESSALON) 200 MG capsule Take 1 capsule (200 mg total) by mouth 3 (three) times daily as needed for cough. (Patient not taking: Reported on 03/16/2017) 60 capsule 0 Not Taking at Unknown time  . losartan (COZAAR) 50 MG tablet Take 1 tablet (50 mg total) by mouth daily. (Patient not taking: Reported on 03/16/2017) 30 tablet 2 Not Taking at Unknown time   Assessment: 81yo female presented to ED with dyspnea.  Asked to initiate heparin for PE vs ACS.  Initial heparin level below target, rate increased, then level became SUPRAtherapeutic.  No bleeding reported.    Goal of Therapy:  Heparin level 0.3-0.7 units/ml Monitor platelets by anticoagulation protocol: Yes   Plan:   Decrease Heparin infusion to 900 units/hr  Heparin level in 6-8 hrs then daily  CBC daily while on Heparin  [place] Crane, Nely Dedmon A 03/17/2017,7:37 AM

## 2017-03-18 DIAGNOSIS — J9601 Acute respiratory failure with hypoxia: Principal | ICD-10-CM

## 2017-03-18 DIAGNOSIS — R748 Abnormal levels of other serum enzymes: Secondary | ICD-10-CM

## 2017-03-18 LAB — BASIC METABOLIC PANEL
ANION GAP: 5 (ref 5–15)
BUN: 20 mg/dL (ref 6–20)
CALCIUM: 8.1 mg/dL — AB (ref 8.9–10.3)
CO2: 25 mmol/L (ref 22–32)
Chloride: 109 mmol/L (ref 101–111)
Creatinine, Ser: 0.86 mg/dL (ref 0.44–1.00)
GFR calc Af Amer: >60 mL/min (ref >60)
GLUCOSE: 157 mg/dL — AB (ref 65–99)
POTASSIUM: 4 mmol/L (ref 3.5–5.1)
SODIUM: 139 mmol/L (ref 135–145)

## 2017-03-18 LAB — MAGNESIUM: MAGNESIUM: 2.4 mg/dL (ref 1.7–2.4)

## 2017-03-18 LAB — CBC
HCT: 38.9 % (ref 36.0–46.0)
Hemoglobin: 12.7 g/dL (ref 12.0–15.0)
MCH: 30 pg (ref 26.0–34.0)
MCHC: 32.6 g/dL (ref 30.0–36.0)
MCV: 92 fL (ref 78.0–100.0)
PLATELETS: 157 10*3/uL (ref 150–400)
RBC: 4.23 MIL/uL (ref 3.87–5.11)
RDW: 14.4 % (ref 11.5–15.5)
WBC: 6.8 10*3/uL (ref 4.0–10.5)

## 2017-03-18 MED ORDER — PANTOPRAZOLE SODIUM 40 MG PO TBEC
40.0000 mg | DELAYED_RELEASE_TABLET | Freq: Two times a day (BID) | ORAL | Status: DC
  Administered 2017-03-18 – 2017-03-19 (×2): 40 mg via ORAL
  Filled 2017-03-18 (×2): qty 1

## 2017-03-18 MED ORDER — FAMOTIDINE 20 MG PO TABS
20.0000 mg | ORAL_TABLET | Freq: Two times a day (BID) | ORAL | Status: DC
  Administered 2017-03-18 – 2017-03-19 (×2): 20 mg via ORAL
  Filled 2017-03-18 (×2): qty 1

## 2017-03-18 MED ORDER — PREDNISONE 20 MG PO TABS
40.0000 mg | ORAL_TABLET | Freq: Every day | ORAL | Status: DC
  Administered 2017-03-19: 40 mg via ORAL
  Filled 2017-03-18: qty 2

## 2017-03-18 NOTE — Progress Notes (Signed)
PROGRESS NOTE    Marie [name]  GDJ:[id number] DOB: August 04, 1936 DOA: 03/16/2017 PCP: [name], MD    Brief Narrative:  81 y/o female with a history of restrictive lung disease, admitted to the hospital with acute respiratory failure, dehydration and generalized weakness. Started on intravenous steroids, antibiotics and bronchodilators for possible acute bronchitis superimposed on restrictive lung disease. She was IV fluids for acute kidney injury. She did have some demand ischemia with elevation of troponin. Cardiology consulted.   Assessment & Plan:   Active Problems:   GERD (gastroesophageal reflux disease)   Essential hypertension   Restrictive lung disease   Parkinson's plus syndrome ([place])   Acute respiratory failure (HCC)   Protein-calorie malnutrition, severe   AKI (acute kidney injury) ([place])   Demand ischemia ([place])   1. Acute respiratory failure with hypoxia. Likely related to underlying restrictive lung disease. May still have an element of acute bronchitis. Recent pulmonary function tests did not indicate any element of COPD . she's been started on supplemental oxygen. Intravenous steroids and antibiotics. Continue neb treatments. Overall breathing is improving. Chest x-ray did not show any evidence of pneumonia. VQ scan negative for pulmonary embolus. Will likely need home oxygen evaluation. Can start tapering steroids 2. Restrictive lung disease. Followed by Dr. [place] Bells. Continue on bronchodilators and IV steroids. 3. Acute kidney injury. Suspect this is related to dehydration and decreased by mouth intake. Improving with IV fluids. 4. Demand ischemia. Mild elevation of troponin on admission, since trending down. EKG did show some mild ST depressions in the anterolateral leads. Cardiology consult and process. 5. Parkinson's plus syndrome. Continue on Sinemet. 6. GERD. May be contributing to patient's cough. Change PPI to twice a day and add H2  blockers. 7. Hypertension. Blood pressure is currently stable. 8. Hypokalemia. Replace. Magnesium normal   DVT prophylaxis: heparin Code Status: full code Family Communication: No family at bedside. Disposition Plan: discharge home once improved, physical therapy consult in a.m.   Consultants:   Cardiology  Procedures:     Antimicrobials:   Doxycycline 5/30>>    Subjective: No nausea or chest discomfort. Shortness of breath is improving.  Objective: Vitals:   03/18/17 0729 03/18/17 0810 03/18/17 1342 03/18/17 1408  BP:  (!) 126/56  (!) 118/51  Pulse:  78  87  Resp:    16  Temp:  97.5 F (36.4 C)  98.5 F (36.9 C)  TempSrc:  Oral  Oral  SpO2: 98% 96% 96% 98%  Weight:      Height:        Intake/Output Summary (Last 24 hours) at 03/18/17 1753 Last data filed at 03/18/17 1742  Gross per 24 hour  Intake             2475 ml  Output              400 ml  Net             2075 ml   Filed Weights   03/16/17 0727 03/16/17 1603  Weight: 56.7 kg (125 lb) 63.8 kg (140 lb 9.6 oz)    Examination:  General exam: Alert, awake, oriented x 3 Respiratory system: Clear to auscultation. Respiratory effort normal. Cardiovascular system:RRR. No murmurs, rubs, gallops. Gastrointestinal system: Abdomen is nondistended, soft and nontender. No organomegaly or masses felt. Normal bowel sounds heard. Central nervous system: Alert and oriented. No focal neurological deficits. Extremities: No C/C/E, +pedal pulses Skin: No rashes, lesions or ulcers Psychiatry: Judgement and insight appear normal. Mood &  affect appropriate.      Data Reviewed: I have personally reviewed following labs and imaging studies  CBC:  Recent Labs Lab 03/16/17 0917 03/17/17 0456 03/18/17 0636  WBC 14.4* 4.9 6.8  NEUTROABS 10.3*  --   --   HGB 17.2* 14.1 12.7  HCT 50.8* 43.0 38.9  MCV 90.7 91.1 92.0  PLT 224 177 010   Basic Metabolic Panel:  Recent Labs Lab 03/16/17 0917 03/17/17 0456  03/18/17 0636  NA 139 138 139  K 3.4* 2.9* 4.0  CL 93* 103 109  CO2 34* 26 25  GLUCOSE 135* 169* 157*  BUN 49* 33* 20  CREATININE 1.54* 0.99 0.86  CALCIUM 9.2 8.2* 8.1*  MG  --   --  2.4   GFR: Estimated Creatinine Clearance: 46.6 mL/min (by C-G formula based on SCr of 0.86 mg/dL). Liver Function Tests:  Recent Labs Lab 03/16/17 0917 03/17/17 0456  AST 32 30  ALT 28 8*  ALKPHOS 47 39  BILITOT 1.5* 1.1  PROT 6.8 5.1*  ALBUMIN 4.1 3.0*   No results for input(s): LIPASE, AMYLASE in the last 168 hours. No results for input(s): AMMONIA in the last 168 hours. Coagulation Profile: No results for input(s): INR, PROTIME in the last 168 hours. Cardiac Enzymes:  Recent Labs Lab 03/16/17 0917 03/16/17 1632 03/16/17 2127 03/17/17 0456  TROPONINI 0.09* 0.11* 0.09* 0.06*   BNP (last 3 results) No results for input(s): PROBNP in the last 8760 hours. HbA1C: No results for input(s): HGBA1C in the last 72 hours. CBG: No results for input(s): GLUCAP in the last 168 hours. Lipid Profile: No results for input(s): CHOL, HDL, LDLCALC, TRIG, CHOLHDL, LDLDIRECT in the last 72 hours. Thyroid Function Tests: No results for input(s): TSH, T4TOTAL, FREET4, T3FREE, THYROIDAB in the last 72 hours. Anemia Panel: No results for input(s): VITAMINB12, FOLATE, FERRITIN, TIBC, IRON, RETICCTPCT in the last 72 hours. Sepsis Labs: No results for input(s): PROCALCITON, LATICACIDVEN in the last 168 hours.  No results found for this or any previous visit (from the past 240 hour(s)).       Radiology Studies: Nm Pulmonary Perf And Vent  Result Date: 03/16/2017 CLINICAL DATA:  Acute onset of shortness of breath and cough. Initial encounter. EXAM: NUCLEAR MEDICINE VENTILATION - PERFUSION LUNG SCAN TECHNIQUE: Ventilation images were obtained in multiple projections using inhaled aerosol Tc-61m DTPA. Perfusion images were obtained in multiple projections after intravenous injection of Tc-55m MAA.  RADIOPHARMACEUTICALS:  30 mCi Technetium-95m DTPA aerosol inhalation and 4.3 mCi Technetium-33m MAA IV COMPARISON:  Chest radiograph performed earlier today at 7:44 a.m. FINDINGS: Ventilation: There are multiple large ventilation defects throughout both lungs, suspicious for underlying emphysema. There is suggestion of artifactual accumulation of activity along the tracheobronchial tree. Perfusion: No significant wedge shaped peripheral perfusion defects to suggest acute pulmonary embolism. Perfusion within both lungs is somewhat heterogeneous in appearance, with small subsegmental filling defects noted bilaterally. IMPRESSION: Low probability for pulmonary embolus. Significantly suboptimal ventilation images raises question for underlying emphysema. Electronically Signed   By: Garald Balding M.D.   On: 03/16/2017 23:44   US Venous Img Lower Bilateral  Result Date: 03/17/2017 CLINICAL DATA:  Hypoxia EXAM: BILATERAL LOWER EXTREMITY VENOUS DUPLEX ULTRASOUND TECHNIQUE: Gray-scale sonography with graded compression, as well as color Doppler and duplex ultrasound were performed to evaluate the lower extremity deep venous systems from the level of the common femoral vein and including the common femoral, femoral, profunda femoral, popliteal and calf veins including the posterior tibial, peroneal and  gastrocnemius veins when visible. The superficial great saphenous vein was also interrogated. Spectral Doppler was utilized to evaluate flow at rest and with distal augmentation maneuvers in the common femoral, femoral and popliteal veins. COMPARISON:  None. FINDINGS: RIGHT LOWER EXTREMITY Common Femoral Vein: No evidence of thrombus. Normal compressibility, respiratory phasicity and response to augmentation. Saphenofemoral Junction: No evidence of thrombus. Normal compressibility and flow on color Doppler imaging. Profunda Femoral Vein: No evidence of thrombus. Normal compressibility and flow on color Doppler imaging.  Femoral Vein: No evidence of thrombus. Normal compressibility, respiratory phasicity and response to augmentation. Popliteal Vein: No evidence of thrombus. Normal compressibility, respiratory phasicity and response to augmentation. Calf Veins: No evidence of thrombus. Normal compressibility and flow on color Doppler imaging. Superficial Great Saphenous Vein: No evidence of thrombus. Normal compressibility and flow on color Doppler imaging. Venous Reflux:  None. Other Findings:  None. LEFT LOWER EXTREMITY Common Femoral Vein: No evidence of thrombus. Normal compressibility, respiratory phasicity and response to augmentation. Saphenofemoral Junction: No evidence of thrombus. Normal compressibility and flow on color Doppler imaging. Profunda Femoral Vein: No evidence of thrombus. Normal compressibility and flow on color Doppler imaging. Femoral Vein: No evidence of thrombus. Normal compressibility, respiratory phasicity and response to augmentation. Popliteal Vein: No evidence of thrombus. Normal compressibility, respiratory phasicity and response to augmentation. Calf Veins: No evidence of thrombus. Normal compressibility and flow on color Doppler imaging. Superficial Great Saphenous Vein: No evidence of thrombus. Normal compressibility and flow on color Doppler imaging. Venous Reflux:  None. Other Findings:  None. IMPRESSION: No evidence of deep venous thrombosis within either lower extremity. Electronically Signed   By: Lowella Grip III M.D.   On: 03/17/2017 09:07        Scheduled Meds: . aspirin EC  81 mg Oral Daily  . carbidopa-levodopa  1 tablet Oral TID  . cholecalciferol  2,000 Units Oral Daily  . doxycycline  100 mg Oral Q12H  . feeding supplement (ENSURE ENLIVE)  237 mL Oral BID BM  . guaiFENesin  1,200 mg Oral BID  . heparin subcutaneous  5,000 Units Subcutaneous Q8H  . ipratropium-albuterol  3 mL Nebulization Q6H  . mouth rinse  15 mL Mouth Rinse BID  . methylPREDNISolone (SOLU-MEDROL)  injection  40 mg Intravenous Q12H  . mometasone-formoterol  2 puff Inhalation BID  . montelukast  10 mg Oral QHS  . pantoprazole  40 mg Oral Daily  . pravastatin  20 mg Oral q1800  . sodium chloride flush  3 mL Intravenous Q12H  . sodium chloride flush  3 mL Intravenous Q12H  . traZODone  25 mg Oral QHS   Continuous Infusions: . sodium chloride    . sodium chloride 75 mL/hr at 03/18/17 1028     LOS: 2 days    Time spent: 52mins    MEMON,JEHANZEB, MD [facility] Pager [phone]  If 7PM-7AM, please contact night-coverage [email or web address] Password [facility] 03/18/2017, 5:53 PM

## 2017-03-18 NOTE — Consult Note (Signed)
Cardiology Consultation:   Patient ID: SHERAL PFAHLER; [id number]; [birth year]   Admit date: 03/16/2017 Date of Consult: 03/18/2017  Primary Care Provider: [name], MD Primary Cardiologist: Dr Debara Pickett   Patient Profile:   Cherlynn Popiel Raya is a 81 y.o. female with a hx of respiratory failure who is being seen today for the evaluation of abnormal Troponin at the request of Dr Roderic Palau.  History of Present Illness:   LEVERN KALKA is a pleasant 81 y/o female with a history recurrent respiratory failure and restrictive lung disease of unclear etiology, followed by Dr Lake Bells, and recently diagnosed Parkinson's Plus syndrome diagnosed by Dr Posey Pronto with [facility]. The pt has no history of chest pain or MI. She was seen by Dr Debara Pickett for a murmur in March 2018 when she was admitted with respiratory failure. He felt her echo did not suggest HOCM. She did have mild to moderate LVH felt to be secondary to HTN.   The pt was admitted 5/23-5/24 with respiratory failure. She felt better at discharge but then again developed increasing SOB. When EMS arrived at her home her oxygen saturation was 82% on room air. Since admission she has had elevated Troponin with a peak of 0.11. Her D-dimer was elevated but VQ was low risk and LE venous dopplers were negative for DVT. She denies any chest pain.    Past Medical History:  Diagnosis Date  . Balance disorder 2008.     Falls a lot.  She can be standing and then leans too far over to one side   . Bulging disc   . Chronic bronchitis ([place])    due to congestion at times, on prednisone and advair  . COPD (chronic obstructive pulmonary disease) ([place])   . Depression    many years ago  . GERD (gastroesophageal reflux disease)   . Hearing loss    mild  . Hyperlipidemia   . Hypertension   . Iatrogenic adrenal insufficiency ([place])   . Incontinence    not indicated at this visit.  Marland Kitchen Neuropathy    legs stay numb   . Osteoarthritis    hands,   .  Osteoporosis   . Shortness of breath dyspnea   . Tubulovillous adenoma of rectum   . Wears dentures   . Wears glasses     Past Surgical History:  Procedure Laterality Date  . ABDOMINAL HYSTERECTOMY    . APPENDECTOMY    . CHONDROPLASTY  09/19/2014   Procedure: CHONDROPLASTY;  Surgeon: Alta Corning, MD;  Location: [place];  Service: Orthopedics;;  . DILATION AND CURETTAGE OF UTERUS    . EXTERNAL FIXATION LEG  10/25/2012   Procedure: EXTERNAL FIXATION LEG;  Surgeon: Rozanna Box, MD;  Location: [place];  Service: Orthopedics;  Laterality: Right;  . EYE SURGERY     bilateral cataract surgery and lens implant  . FINGER SURGERY     fusions and debridements for OA  . INCONTINENCE SURGERY     multiple procedures, not cured  . KNEE ARTHROSCOPY WITH LATERAL MENISECTOMY Right 09/19/2014   Procedure: KNEE ARTHROSCOPY WITH LATERAL MENISECTOMY;  Surgeon: Alta Corning, MD;  Location: [place];  Service: Orthopedics;  Laterality: Right;  . KNEE ARTHROSCOPY WITH MEDIAL MENISECTOMY Right 09/19/2014   Procedure: RIGHT KNEE ARTHROSCOPY WITH MEDIAL AND LATERAL MENISECTOMIES. CHONDROPLASTY OF PATELLA-FEMORAL JOINT;  Surgeon: Alta Corning, MD;  Location: [place];  Service: Orthopedics;  Laterality: Right;  . RECTAL  BIOPSY  09/21/2011   Procedure: BIOPSY RECTAL;  Surgeon: Merrie Roof, MD;  Location: [place];  Service: General;  Laterality: N/A;  3-4 cm  . RECTAL SURGERY     by dr. Marlou Starks, removal of polyp  . TONSILLECTOMY       Inpatient Medications: Scheduled Meds: . aspirin EC  81 mg Oral Daily  . carbidopa-levodopa  1 tablet Oral TID  . cholecalciferol  2,000 Units Oral Daily  . doxycycline  100 mg Oral Q12H  . feeding supplement (ENSURE ENLIVE)  237 mL Oral BID BM  . guaiFENesin  1,200 mg Oral BID  . heparin subcutaneous  5,000 Units Subcutaneous Q8H  . ipratropium-albuterol  3 mL Nebulization Q6H  . mouth rinse  15 mL Mouth Rinse BID  .  methylPREDNISolone (SOLU-MEDROL) injection  40 mg Intravenous Q12H  . mometasone-formoterol  2 puff Inhalation BID  . montelukast  10 mg Oral QHS  . pantoprazole  40 mg Oral Daily  . pravastatin  20 mg Oral q1800  . sodium chloride flush  3 mL Intravenous Q12H  . sodium chloride flush  3 mL Intravenous Q12H  . traZODone  25 mg Oral QHS   Continuous Infusions: . sodium chloride    . sodium chloride 75 mL/hr at 03/18/17 0512   PRN Meds: sodium chloride, acetaminophen **OR** acetaminophen, ipratropium-albuterol, nitroGLYCERIN, ondansetron **OR** ondansetron (ZOFRAN) IV, sodium chloride flush  Allergies:    Allergies  Allergen Reactions  . Ertapenem Hives    Caused whole body to turn red Other reaction(s): Redness Caused whole body to turn red  . Codeine Other (See Comments)    hallucinations  . Demerol Other (See Comments)    hallucinations  . Morphine And Related Other (See Comments)    hallucinations  . Other Other (See Comments)    Invan 7- pt became red all over  . Sulfate Other (See Comments)    unknown    Social History:   Social History   Social History  . Marital status: Married    Spouse name: N/A  . Number of children: 1  . Years of education: 17   Occupational History  . Not on file.   Social History Main Topics  . Smoking status: Never Smoker  . Smokeless tobacco: Never Used  . Alcohol use No  . Drug use: No  . Sexual activity: Not on file   Other Topics Concern  . Not on file   Social History Narrative   Lives in a house that is mostly single story.  She uses a cane and a walker which she uses for ambulation.  Drives.  Lives with husband.  Has one daughter.  Retired Building control surveyor.  Education: high school.    Family History:   The patient's family history includes COPD in her mother; Cancer in her sister; Heart attack in her father; High blood pressure in her mother.  ROS:  Please see the history of present illness. All other ROS reviewed and  negative.     Physical Exam/Data:   Vitals:   03/17/17 2047 03/18/17 0533 03/18/17 0729 03/18/17 0810  BP: (!) 116/39 (!) 121/45  (!) 126/56  Pulse: 94 74  78  Resp: 18 18    Temp: 99 F (37.2 C) 97.4 F (36.3 C)  97.5 F (36.4 C)  TempSrc: Oral Oral  Oral  SpO2: 97% 99% 98% 96%  Weight:      Height:        Intake/Output Summary (Last  24 hours) at 03/18/17 0910 Last data filed at 03/18/17 0703  Gross per 24 hour  Intake             2730 ml  Output              200 ml  Net             2530 ml   Filed Weights   03/16/17 0727 03/16/17 1603  Weight: 125 lb (56.7 kg) 140 lb 9.6 oz (63.8 kg)   Body mass index is 24.13 kg/m.  General:  Chronically ill appearing female, SOB at rest, on O2 HEENT: normal Lymph: no adenopathy Neck: no JVD Endocrine:  No thryomegaly Vascular: No carotid bruits; FA pulses 2+ bilaterally without bruits  Cardiac:  normal S1, S2; RRR 2/6 systolic murmur AOV and LSB Lungs:  Decreased breath sounds, no wheezing Abd: soft, nontender, no hepatomegaly  Ext: no edema Musculoskeletal:  No deformities, BUE and BLE strength normal and equal Skin: warm and dry  Neuro:  CNs 2-12 intact, no focal abnormalities noted Psych:  Normal affect    EKG:  The EKG was personally reviewed and demonstrates NSR, inferior Qs, NSST changes  Relevant CV Studies: Echo 12/15/16 Study Conclusions  - Left ventricle: The cavity size was normal. Systolic function was   normal. There was dynamic obstruction, with a peak velocity of   136 cm/sec and a peak gradient of 7 mm Hg. Wall motion was   normal; there were no regional wall motion abnormalities. Doppler   parameters are consistent with abnormal left ventricular   relaxation (grade 1 diastolic dysfunction). Doppler parameters   are consistent with indeterminate ventricular filling pressure. - Aortic valve: Transvalvular velocity was within the normal range.   There was no stenosis. There was no regurgitation. -  Mitral valve: Transvalvular velocity was within the normal range.   There was no evidence for stenosis. There was no regurgitation. - Right ventricle: The cavity size was normal. Wall thickness was   normal. Systolic function was normal. - Atrial septum: No defect or patent foramen ovale was identified   by color flow Doppler. - Tricuspid valve: There was trivial regurgitation. - Pulmonary arteries: Systolic pressure was within the normal   range. PA peak pressure: 28 mm Hg (S).  Laboratory Data:  Chemistry Recent Labs Lab 03/16/17 0917 03/17/17 0456 03/18/17 0636  NA 139 138 139  K 3.4* 2.9* 4.0  CL 93* 103 109  CO2 34* 26 25  GLUCOSE 135* 169* 157*  BUN 49* 33* 20  CREATININE 1.54* 0.99 0.86  CALCIUM 9.2 8.2* 8.1*  GFRNONAA 31* 53* >60  GFRAA 36* >60 >60  ANIONGAP 12 9 5      Recent Labs Lab 03/16/17 0917 03/17/17 0456  PROT 6.8 5.1*  ALBUMIN 4.1 3.0*  AST 32 30  ALT 28 8*  ALKPHOS 47 39  BILITOT 1.5* 1.1   Hematology Recent Labs Lab 03/16/17 0917 03/17/17 0456 03/18/17 0636  WBC 14.4* 4.9 6.8  RBC 5.60* 4.72 4.23  HGB 17.2* 14.1 12.7  HCT 50.8* 43.0 38.9  MCV 90.7 91.1 92.0  MCH 30.7 29.9 30.0  MCHC 33.9 32.8 32.6  RDW 13.9 14.2 14.4  PLT 224 177 157   Cardiac Enzymes Recent Labs Lab 03/16/17 0917 03/16/17 1632 03/16/17 2127 03/17/17 0456  TROPONINI 0.09* 0.11* 0.09* 0.06*   No results for input(s): TROPIPOC in the last 168 hours.  BNPNo results for input(s): BNP, PROBNP in the last 168 hours.  DDimer  Recent Labs Lab 03/16/17 1632  DDIMER 3.32*    Radiology/Studies:  Nm Pulmonary Perf And Vent  Result Date: 03/16/2017 CLINICAL DATA:  Acute onset of shortness of breath and cough. Initial encounter. EXAM: NUCLEAR MEDICINE VENTILATION - PERFUSION LUNG SCAN TECHNIQUE: Ventilation images were obtained in multiple projections using inhaled aerosol Tc-64m DTPA. Perfusion images were obtained in multiple projections after intravenous injection  of Tc-61m MAA. RADIOPHARMACEUTICALS:  30 mCi Technetium-27m DTPA aerosol inhalation and 4.3 mCi Technetium-20m MAA IV COMPARISON:  Chest radiograph performed earlier today at 7:44 a.m. FINDINGS: Ventilation: There are multiple large ventilation defects throughout both lungs, suspicious for underlying emphysema. There is suggestion of artifactual accumulation of activity along the tracheobronchial tree. Perfusion: No significant wedge shaped peripheral perfusion defects to suggest acute pulmonary embolism. Perfusion within both lungs is somewhat heterogeneous in appearance, with small subsegmental filling defects noted bilaterally. IMPRESSION: Low probability for pulmonary embolus. Significantly suboptimal ventilation images raises question for underlying emphysema. Electronically Signed   By: Garald Balding M.D.   On: 03/16/2017 23:44   US Venous Img Lower Bilateral  Result Date: 03/17/2017 CLINICAL DATA:  Hypoxia EXAM: BILATERAL LOWER EXTREMITY VENOUS DUPLEX ULTRASOUND TECHNIQUE: Gray-scale sonography with graded compression, as well as color Doppler and duplex ultrasound were performed to evaluate the lower extremity deep venous systems from the level of the common femoral vein and including the common femoral, femoral, profunda femoral, popliteal and calf veins including the posterior tibial, peroneal and gastrocnemius veins when visible. The superficial great saphenous vein was also interrogated. Spectral Doppler was utilized to evaluate flow at rest and with distal augmentation maneuvers in the common femoral, femoral and popliteal veins. COMPARISON:  None. FINDINGS: RIGHT LOWER EXTREMITY Common Femoral Vein: No evidence of thrombus. Normal compressibility, respiratory phasicity and response to augmentation. Saphenofemoral Junction: No evidence of thrombus. Normal compressibility and flow on color Doppler imaging. Profunda Femoral Vein: No evidence of thrombus. Normal compressibility and flow on color  Doppler imaging. Femoral Vein: No evidence of thrombus. Normal compressibility, respiratory phasicity and response to augmentation. Popliteal Vein: No evidence of thrombus. Normal compressibility, respiratory phasicity and response to augmentation. Calf Veins: No evidence of thrombus. Normal compressibility and flow on color Doppler imaging. Superficial Great Saphenous Vein: No evidence of thrombus. Normal compressibility and flow on color Doppler imaging. Venous Reflux:  None. Other Findings:  None. LEFT LOWER EXTREMITY Common Femoral Vein: No evidence of thrombus. Normal compressibility, respiratory phasicity and response to augmentation. Saphenofemoral Junction: No evidence of thrombus. Normal compressibility and flow on color Doppler imaging. Profunda Femoral Vein: No evidence of thrombus. Normal compressibility and flow on color Doppler imaging. Femoral Vein: No evidence of thrombus. Normal compressibility, respiratory phasicity and response to augmentation. Popliteal Vein: No evidence of thrombus. Normal compressibility, respiratory phasicity and response to augmentation. Calf Veins: No evidence of thrombus. Normal compressibility and flow on color Doppler imaging. Superficial Great Saphenous Vein: No evidence of thrombus. Normal compressibility and flow on color Doppler imaging. Venous Reflux:  None. Other Findings:  None. IMPRESSION: No evidence of deep venous thrombosis within either lower extremity. Electronically Signed   By: Lowella Grip III M.D.   On: 03/17/2017 09:07   Dg Chest Portable 1 View  Result Date: 03/16/2017 CLINICAL DATA:  Short of breath EXAM: PORTABLE CHEST 1 VIEW COMPARISON:  03/09/2017 FINDINGS: Normal heart size. Lungs clear. No pneumothorax. No pleural effusion. IMPRESSION: No active disease. Electronically Signed   By: Marybelle Killings M.D.   On: 03/16/2017 08:00  Assessment and Plan:   1. Recurrent respiratory failure-   2. HCVD- LVH, outflow track murmur  3. Troponin  elevation- no history of CAD but inferior Qs on EKG- consider underlying ischemia.  4. Parkinson's Plus syndrome  5. HLD  Plan- MD to see   Signed, Kerin Ransom, PA-C  03/18/2017 9:10 AM   Attending note Patient seen and discussed with PA Rosalyn Gess, I agree with his documentation above. History of COPD, HL, HTN, LVH with hyperdynamic LV with midcavitary gradient, admitted with SOB, cough, wheezing. In Er found to be hypoxic to 82% on RA.   02/2017 VQ low probability. Possible emphysema pattern LE [place]: no DVT 11/2016 echoL LVEF hyperdyanmic, mild midcavitary gradient, normal RV, PASP 28 EKG SR, chronic lateral ST depressions  Mild troponin elevation in setting of significant hypoxia and respiratory exacerbation, trop trended down. At this time suspect demand ischemia. We will repeat echo to evaluate for any change in LV or RV function. Do not anticipate ischemic testing at this time  Zandra Abts MD

## 2017-03-19 ENCOUNTER — Inpatient Hospital Stay (HOSPITAL_COMMUNITY): Payer: Medicare Other

## 2017-03-19 DIAGNOSIS — R0600 Dyspnea, unspecified: Secondary | ICD-10-CM

## 2017-03-19 LAB — ECHOCARDIOGRAM COMPLETE
Height: 64 in
WEIGHTICAEL: 2249.6 [oz_av]

## 2017-03-19 MED ORDER — FUROSEMIDE 40 MG PO TABS: 20.0000 mg | ORAL_TABLET | Freq: Every day | ORAL | Status: DC | PRN

## 2017-03-19 MED ORDER — DILTIAZEM HCL 30 MG PO TABS: 30.0000 mg | ORAL_TABLET | Freq: Two times a day (BID) | ORAL | 0 refills | Status: DC

## 2017-03-19 MED ORDER — FAMOTIDINE 20 MG PO TABS
20.0000 mg | ORAL_TABLET | Freq: Two times a day (BID) | ORAL | 0 refills | Status: DC
Start: 2017-03-19 — End: 2017-07-21

## 2017-03-19 MED ORDER — DILTIAZEM HCL 30 MG PO TABS
30.0000 mg | ORAL_TABLET | Freq: Two times a day (BID) | ORAL | Status: DC
  Administered 2017-03-19: 30 mg via ORAL
  Filled 2017-03-19: qty 1

## 2017-03-19 MED ORDER — PREDNISONE 10 MG PO TABS: ORAL_TABLET | ORAL | 0 refills | Status: DC

## 2017-03-19 NOTE — Evaluation (Signed)
Physical Therapy Evaluation Patient Details Name: Marie [name] MRN: [id number] DOB: [birth year] Today's Date: 03/19/2017   History of Present Illness  Marie [name] is a 81 y.o. female with medical history significant of COPD with recent hospitalization for an exacerbation, discharged on 5/23 after a 24 hours stay. Since patient reached home she has developed worsening weakness, generalized, worsening to the point where she was symptomatic with minimal efforts, worse with exertion, no improving factors, associated with worsening dyspnea and dry cough as well as wheezing. Found to be hypoxic in ED admitted for COPD exacerbation. Since admitted, elevated Tropponin atributed to demand ischemia, (+) D-dimer with negative workup for  PE.    Clinical Impression  Pt admitted with above diagnosis. Pt currently with functional limitations due to the deficits listed below (see "PT Problem List"). Pt performing all mobility a baseline level of function and will have caregiver support as needed at home. Pt has good safety awareness. No increased SOB with AMB. SpO2 WNL during all activity. Pt monitors SpO2 at home with oximeter. Pt will benefit from skilled PT intervention to increase independence and safety with basic mobility in preparation for discharge to the venue listed below.      Follow Up Recommendations No PT follow up    Equipment Recommendations  None recommended by PT    Recommendations for Other Services       Precautions / Restrictions Precautions Precautions: Fall Restrictions Weight Bearing Restrictions: No      Mobility  Bed Mobility               General bed mobility comments: received up in chair   Transfers Overall transfer level: Independent Equipment used: None Transfers: Sit to/from Stand Sit to Stand: Independent            Ambulation/Gait Ambulation/Gait assistance: Supervision Ambulation Distance (Feet): 150 Feet Assistive device: Rolling walker (2  wheeled)     Gait velocity interpretation: <1.8 ft/sec, indicative of risk for recurrent falls General Gait Details: safe use of RW even though she isaccustomed to [employer]   Stairs            Wheelchair Mobility    Modified Rankin (Stroke Patients Only)       Balance                                             Pertinent Vitals/Pain Pain Assessment: No/denies pain    Home Living Family/patient expects to be discharged to:: Private residence Living Arrangements: Spouse/significant other Available Help at Discharge: Family;Available 24 hours/day Type of Home: House Home Access: Ramped entrance     Home Layout: Two level;Able to live on main level with bedroom/bathroom Home Equipment: Walker - 4 wheels;Cane - single point;Shower seat;Grab bars - toilet;Grab bars - tub/shower;Wheelchair - manual      Prior Function Level of Independence: Independent with assistive device(s)         Comments: pt reports that she ambulates with use of a rollator and has a manual w/c for longer community distances     Hand Dominance        Extremity/Trunk Assessment   Upper Extremity Assessment Upper Extremity Assessment: Generalized weakness    Lower Extremity Assessment Lower Extremity Assessment: Generalized weakness       Communication   Communication: No difficulties  Cognition Arousal/Alertness: Awake/alert Behavior During Therapy: WFL for  tasks assessed/performed Overall Cognitive Status: Within Functional Limits for tasks assessed                                        General Comments      Exercises     Assessment/Plan    PT Assessment Patent does not need any further PT services  PT Problem List         PT Treatment Interventions      PT Goals (Current goals can be found in the Care Plan section)  Acute Rehab PT Goals PT Goal Formulation: All assessment and education complete, DC therapy    Frequency      Barriers to discharge        Co-evaluation               AM-PAC PT "6 Clicks" Daily Activity  Outcome Measure                  End of Session Equipment Utilized During Treatment: Gait belt Activity Tolerance: Patient tolerated treatment well;Patient limited by fatigue Patient left: in chair;with family/visitor present;with call bell/phone within reach Nurse Communication: Mobility status (SpO2 )      Time: 4920-1007 PT Time Calculation (min) (ACUTE ONLY): 12 min   Charges:   PT Evaluation $PT Eval Moderate Complexity: 1 Procedure     PT G Codes:        1:13 PM, 03/29/17 Etta Grandchild, PT, DPT Physical Therapist - [place] [phone] (Office)    Buccola,Allan C 2017-03-29, 1:12 PM

## 2017-03-19 NOTE — Care Management Note (Signed)
Case Management Note  Patient Details  Name: LARAMIE GELLES MRN: [id number] Date of Birth: December 05, 1935     Expected Discharge Date:  03/19/17               Expected Discharge Plan:  [place]  In-House Referral:  NA  Discharge planning Services  CM Consult  Post Acute Care Choice:  Home Health Choice offered to:  Patient  DME Arranged:    DME Agency:  [place]:  RN [facility] Agency:     Status of Service:  Completed, signed off  If discussed at [place] of Stay Meetings, dates discussed:    Additional Comments: Patient discharging home today. HH RN ordered, patient agreeable, wants [facility]. [place] Cal[facility] of [facility] notified and will obtain orders from chart. Patient aware that [facility] has 48 hours to initiate services.  Robbie Rideaux, Chauncey [place], RN 03/19/2017, 2:38 PM

## 2017-03-19 NOTE — Progress Notes (Signed)
Progress Note  Patient Name: Marie [name] Date of Encounter: 03/19/2017   Subjective   SOB is improving  Inpatient Medications    Scheduled Meds: . aspirin EC  81 mg Oral Daily  . carbidopa-levodopa  1 tablet Oral TID  . cholecalciferol  2,000 Units Oral Daily  . doxycycline  100 mg Oral Q12H  . famotidine  20 mg Oral BID  . feeding supplement (ENSURE ENLIVE)  237 mL Oral BID BM  . guaiFENesin  1,200 mg Oral BID  . heparin subcutaneous  5,000 Units Subcutaneous Q8H  . ipratropium-albuterol  3 mL Nebulization Q6H  . mouth rinse  15 mL Mouth Rinse BID  . mometasone-formoterol  2 puff Inhalation BID  . montelukast  10 mg Oral QHS  . pantoprazole  40 mg Oral BID  . pravastatin  20 mg Oral q1800  . predniSONE  40 mg Oral Q breakfast  . sodium chloride flush  3 mL Intravenous Q12H  . sodium chloride flush  3 mL Intravenous Q12H  . traZODone  25 mg Oral QHS   Continuous Infusions: . sodium chloride     PRN Meds: sodium chloride, acetaminophen **OR** acetaminophen, ipratropium-albuterol, nitroGLYCERIN, ondansetron **OR** ondansetron (ZOFRAN) IV, sodium chloride flush   Vital Signs    Vitals:   03/18/17 2151 03/19/17 0650 03/19/17 0805 03/19/17 0810  BP: 121/69 130/63    Pulse: 94 82    Resp: 20 20    Temp: 98.5 F (36.9 C) 97.5 F (36.4 C)    TempSrc: Oral Oral    SpO2: 96% 99% 98% 100%  Weight:      Height:        Intake/Output Summary (Last 24 hours) at 03/19/17 0900 Last data filed at 03/19/17 0650  Gross per 24 hour  Intake             1095 ml  Output              300 ml  Net              795 ml   Filed Weights   03/16/17 0727 03/16/17 1603  Weight: 125 lb (56.7 kg) 140 lb 9.6 oz (63.8 kg)    Telemetry    SR, PACs - Personally Reviewed  ECG    n/a  Physical Exam   GEN: No acute distress.   Neck: No JVD Cardiac: RRR, 3/6 systolic murmur RUSB, no rubs, or gallops.  Respiratory: Clear to auscultation bilaterally. GI: Soft, nontender,  non-distended  MS: No edema; No deformity. Neuro:  Nonfocal  Psych: Normal affect   Labs    Chemistry Recent Labs Lab 03/16/17 0917 03/17/17 0456 03/18/17 0636  NA 139 138 139  K 3.4* 2.9* 4.0  CL 93* 103 109  CO2 34* 26 25  GLUCOSE 135* 169* 157*  BUN 49* 33* 20  CREATININE 1.54* 0.99 0.86  CALCIUM 9.2 8.2* 8.1*  PROT 6.8 5.1*  --   ALBUMIN 4.1 3.0*  --   AST 32 30  --   ALT 28 8*  --   ALKPHOS 47 39  --   BILITOT 1.5* 1.1  --   GFRNONAA 31* 53* >60  GFRAA 36* >60 >60  ANIONGAP 12 9 5      Hematology Recent Labs Lab 03/16/17 0917 03/17/17 0456 03/18/17 0636  WBC 14.4* 4.9 6.8  RBC 5.60* 4.72 4.23  HGB 17.2* 14.1 12.7  HCT 50.8* 43.0 38.9  MCV 90.7 91.1 92.0  MCH 30.7  29.9 30.0  MCHC 33.9 32.8 32.6  RDW 13.9 14.2 14.4  PLT 224 177 157    Cardiac Enzymes Recent Labs Lab 03/16/17 0917 03/16/17 1632 03/16/17 2127 03/17/17 0456  TROPONINI 0.09* 0.11* 0.09* 0.06*   No results for input(s): TROPIPOC in the last 168 hours.   BNPNo results for input(s): BNP, PROBNP in the last 168 hours.   DDimer  Recent Labs Lab 03/16/17 1632  DDIMER 3.32*     Radiology    US Venous Img Lower Bilateral  Result Date: 03/17/2017 CLINICAL DATA:  Hypoxia EXAM: BILATERAL LOWER EXTREMITY VENOUS DUPLEX ULTRASOUND TECHNIQUE: Gray-scale sonography with graded compression, as well as color Doppler and duplex ultrasound were performed to evaluate the lower extremity deep venous systems from the level of the common femoral vein and including the common femoral, femoral, profunda femoral, popliteal and calf veins including the posterior tibial, peroneal and gastrocnemius veins when visible. The superficial great saphenous vein was also interrogated. Spectral Doppler was utilized to evaluate flow at rest and with distal augmentation maneuvers in the common femoral, femoral and popliteal veins. COMPARISON:  None. FINDINGS: RIGHT LOWER EXTREMITY Common Femoral Vein: No evidence of  thrombus. Normal compressibility, respiratory phasicity and response to augmentation. Saphenofemoral Junction: No evidence of thrombus. Normal compressibility and flow on color Doppler imaging. Profunda Femoral Vein: No evidence of thrombus. Normal compressibility and flow on color Doppler imaging. Femoral Vein: No evidence of thrombus. Normal compressibility, respiratory phasicity and response to augmentation. Popliteal Vein: No evidence of thrombus. Normal compressibility, respiratory phasicity and response to augmentation. Calf Veins: No evidence of thrombus. Normal compressibility and flow on color Doppler imaging. Superficial Great Saphenous Vein: No evidence of thrombus. Normal compressibility and flow on color Doppler imaging. Venous Reflux:  None. Other Findings:  None. LEFT LOWER EXTREMITY Common Femoral Vein: No evidence of thrombus. Normal compressibility, respiratory phasicity and response to augmentation. Saphenofemoral Junction: No evidence of thrombus. Normal compressibility and flow on color Doppler imaging. Profunda Femoral Vein: No evidence of thrombus. Normal compressibility and flow on color Doppler imaging. Femoral Vein: No evidence of thrombus. Normal compressibility, respiratory phasicity and response to augmentation. Popliteal Vein: No evidence of thrombus. Normal compressibility, respiratory phasicity and response to augmentation. Calf Veins: No evidence of thrombus. Normal compressibility and flow on color Doppler imaging. Superficial Great Saphenous Vein: No evidence of thrombus. Normal compressibility and flow on color Doppler imaging. Venous Reflux:  None. Other Findings:  None. IMPRESSION: No evidence of deep venous thrombosis within either lower extremity. Electronically Signed   By: Lowella Grip III M.D.   On: 03/17/2017 09:07    Cardiac Studies   Patient Profile     Marie [name] is a pleasant 81 y/o female with a history recurrent respiratory failure and restrictive  lung disease of unclear etiology, followed by Dr Lake Bells, and recently diagnosed Parkinson's Plus syndrome diagnosed by Dr Posey Pronto with [facility]. Cardiology consulted for elevated troponin.    Assessment & Plan    1. Elevated troponin - mild elevation in setting of significant hypoxia and respiratory exacerbation - peak trop 0.11 trending down. She denies any chest pain to me. EKG SR probably LAFB that looks like inferior infarct, no acute ischemic changes - f/u echo today. No ischemic testing planned at this time.  - negative workup for PE  Signed, Carlyle Dolly, MD  03/19/2017, 9:00 AM

## 2017-03-19 NOTE — Discharge Summary (Signed)
Physician Discharge Summary  Marie [name] ZOX:[id number] DOB: Jul 11, 1936 DOA: 03/16/2017  PCP: [name], MD  Admit date: 03/16/2017 Discharge date: 03/19/2017  Admitted From: Home Disposition:  Home  Recommendations for Outpatient Follow-up:  1. Follow up with PCP in 1-2 weeks 2. Please obtain BMP/CBC in one week 3. Follow-up with cardiology in 1-2 weeks 4. Follow-up with pulmonology on 6/6 as previously scheduled  Home Health:Home health RN Equipment/Devices:  Discharge Condition:Stable CODE STATUS:Full code Diet recommendation: Heart Healthy  Brief/Interim Summary: 82 y/o female with a history of restrictive lung disease, admitted to the hospital with acute respiratory failure, dehydration and generalized weakness. Started on intravenous steroids, antibiotics and bronchodilators for possible acute bronchitis superimposed on restrictive lung disease  Discharge Diagnoses:  Active Problems:   GERD (gastroesophageal reflux disease)   Essential hypertension   Restrictive lung disease   Parkinson's plus syndrome ([place])   Acute respiratory failure (HCC)   Protein-calorie malnutrition, severe   AKI (acute kidney injury) ([place])   Demand ischemia ([place])  1. Acute respiratory failure with hypoxia. Likely related to underlying restrictive lung disease. May still have an element of acute bronchitis. Recent pulmonary function tests did not indicate any element of COPD . She was initially started on supplemental oxygen, but by the end of her hospital stay was breathing comfortably on room air and maintain oxygen saturations greater than 90% on ambulation.Marland Kitchen She was treated with intravenous steroids, antibiotics and bronchodilators. Chest x-ray did not show any evidence of pneumonia. VQ scan negative for pulmonary embolus. She is placed on a prednisone taper. She plans to follow-up with her pulmonologist later this week. 2. Restrictive lung disease. Followed by Dr. Lake Bells. Appears to be at  baseline. 3. Acute kidney injury. Suspect this was related to dehydration and decreased by mouth intake. She was treated with IV fluids and improved. 4. Demand ischemia. Mild elevation of troponin on admission, since trending down. EKG did show some mild ST depressions in the anterolateral leads. Cardiology was consult. Echocardiogram showed preserved ejection fraction and there was a fairly elevated subvalvular gradient of 70 mmHg in the setting of hyperdynamic LV, global hypertrophy and SAM. She was started on diltiazem 30 mg twice a day. No plans for ischemic workup during her hospital stay. Follow-up with her primary cardiologist. 5. Parkinson's plus syndrome. Continue on Sinemet. 6. GERD. May be contributing to patient's cough. She is on PPI and H2 blockers.. 7. Hypertension. Blood pressure is currently stable. 8. Hypokalemia. Replaced. Magnesium normal  Discharge Instructions  Discharge Instructions    Diet - low sodium heart healthy    Complete by:  As directed    Increase activity slowly    Complete by:  As directed      Allergies as of 03/19/2017      Reactions   Ertapenem Hives   Caused whole body to turn red Other reaction(s): Redness Caused whole body to turn red   Codeine Other (See Comments)   hallucinations   Demerol Other (See Comments)   hallucinations   Morphine And Related Other (See Comments)   hallucinations   Other Other (See Comments)   Invan 7- pt became red all over   Sulfate Other (See Comments)   unknown      Medication List    STOP taking these medications   doxycycline 100 MG tablet Commonly known as:  ADOXA   losartan 50 MG tablet Commonly known as:  COZAAR   losartan-hydrochlorothiazide 100-25 MG tablet Commonly known as:  HYZAAR  TAKE these medications   ADVAIR DISKUS 250-50 MCG/DOSE Aepb Generic drug:  Fluticasone-Salmeterol INHALE ONE PUFF BY MOUTH TWICE DAILY   albuterol (2.5 MG/3ML) 0.083% nebulizer solution Commonly known as:   PROVENTIL USE ONE VIAL IN NEBULIZER EVERY 6 HOURS AS NEEDED FOR WHEEZING OR SHORTNESS OF BREATH   aspirin 81 MG tablet Take 81 mg by mouth daily. Buys OTC   benzonatate 200 MG capsule Commonly known as:  TESSALON Take 1 capsule (200 mg total) by mouth 3 (three) times daily as needed for cough.   carbidopa-levodopa 25-100 MG tablet Commonly known as:  SINEMET IR Take 1 tablet by mouth 3 (three) times daily.   diltiazem 30 MG tablet Commonly known as:  CARDIZEM Take 1 tablet (30 mg total) by mouth 2 (two) times daily.   famotidine 20 MG tablet Commonly known as:  PEPCID Take 1 tablet (20 mg total) by mouth 2 (two) times daily.   furosemide 40 MG tablet Commonly known as:  LASIX Take 0.5 tablets (20 mg total) by mouth daily as needed for fluid. What changed:  when to take this  reasons to take this   guaiFENesin 600 MG 12 hr tablet Commonly known as:  MUCINEX Take 2 tablets (1,200 mg total) by mouth 2 (two) times daily.   montelukast 10 MG tablet Commonly known as:  SINGULAIR Take 10 mg by mouth at bedtime.   nystatin 100000 UNIT/ML suspension Commonly known as:  MYCOSTATIN Take 5 mLs (500,000 Units total) by mouth 4 (four) times daily. What changed:  when to take this   omeprazole 40 MG capsule Commonly known as:  PRILOSEC Take 1 capsule (40 mg total) by mouth 2 (two) times daily.   potassium chloride 10 MEQ tablet Commonly known as:  K-DUR Take 1 tablet (10 mEq total) by mouth daily.   pravastatin 40 MG tablet Commonly known as:  PRAVACHOL Take 20 mg by mouth daily.   predniSONE 10 MG tablet Commonly known as:  DELTASONE Take 40mg  po daily for 2 days then 30mg  daily for 2 days then 20mg  daily for 2 days then 10mg  daily What changed:  how much to take  how to take this  when to take this  additional instructions   sodium chloride HYPERTONIC 3 % nebulizer solution Take 1 vial by nebulization BID   traZODone 50 MG tablet Commonly known as:   DESYREL Take 25 mg by mouth at bedtime.   Vitamin D 2000 units Caps Take 2,000 Units by mouth daily.      Follow-up Information    [name], MD Follow up.   Specialty:  Internal Medicine Why:  Call to schedule hospital follow-up within 1-2 weeks. Contact information: [address] [place] [address] [phone]        Juanito Doom, MD Follow up.   Specialty:  Pulmonary Disease Why:  Follow-up appointment as scheduled. Contact information: [place] [address] [phone]        Pixie Casino, MD Follow up.   Specialty:  Cardiology Why:  Hospital follow-up appointment on Monday, 03/29/2017 at 1:30 p.m. Contact information: [address] [place] [address] [phone]          Allergies  Allergen Reactions  . Ertapenem Hives    Caused whole body to turn red Other reaction(s): Redness Caused whole body to turn red  . Codeine Other (See Comments)    hallucinations  . Demerol Other (See Comments)    hallucinations  .  Morphine And Related Other (See Comments)    hallucinations  . Other Other (See Comments)    Invan 7- pt became red all over  . Sulfate Other (See Comments)    unknown    Consultations:  Cardiology   Procedures/Studies: Nm Pulmonary Perf And Vent  Result Date: 03/16/2017 CLINICAL DATA:  Acute onset of shortness of breath and cough. Initial encounter. EXAM: NUCLEAR MEDICINE VENTILATION - PERFUSION LUNG SCAN TECHNIQUE: Ventilation images were obtained in multiple projections using inhaled aerosol Tc-37m DTPA. Perfusion images were obtained in multiple projections after intravenous injection of Tc-75m MAA. RADIOPHARMACEUTICALS:  30 mCi Technetium-83m DTPA aerosol inhalation and 4.3 mCi Technetium-53m MAA IV COMPARISON:  Chest radiograph performed earlier today at 7:44 a.m. FINDINGS: Ventilation: There are multiple large ventilation defects throughout both lungs, suspicious for  underlying emphysema. There is suggestion of artifactual accumulation of activity along the tracheobronchial tree. Perfusion: No significant wedge shaped peripheral perfusion defects to suggest acute pulmonary embolism. Perfusion within both lungs is somewhat heterogeneous in appearance, with small subsegmental filling defects noted bilaterally. IMPRESSION: Low probability for pulmonary embolus. Significantly suboptimal ventilation images raises question for underlying emphysema. Electronically Signed   By: Garald Balding M.D.   On: 03/16/2017 23:44   US Venous Img Lower Bilateral  Result Date: 03/17/2017 CLINICAL DATA:  Hypoxia EXAM: BILATERAL LOWER EXTREMITY VENOUS DUPLEX ULTRASOUND TECHNIQUE: Gray-scale sonography with graded compression, as well as color Doppler and duplex ultrasound were performed to evaluate the lower extremity deep venous systems from the level of the common femoral vein and including the common femoral, femoral, profunda femoral, popliteal and calf veins including the posterior tibial, peroneal and gastrocnemius veins when visible. The superficial great saphenous vein was also interrogated. Spectral Doppler was utilized to evaluate flow at rest and with distal augmentation maneuvers in the common femoral, femoral and popliteal veins. COMPARISON:  None. FINDINGS: RIGHT LOWER EXTREMITY Common Femoral Vein: No evidence of thrombus. Normal compressibility, respiratory phasicity and response to augmentation. Saphenofemoral Junction: No evidence of thrombus. Normal compressibility and flow on color Doppler imaging. Profunda Femoral Vein: No evidence of thrombus. Normal compressibility and flow on color Doppler imaging. Femoral Vein: No evidence of thrombus. Normal compressibility, respiratory phasicity and response to augmentation. Popliteal Vein: No evidence of thrombus. Normal compressibility, respiratory phasicity and response to augmentation. Calf Veins: No evidence of thrombus. Normal  compressibility and flow on color Doppler imaging. Superficial Great Saphenous Vein: No evidence of thrombus. Normal compressibility and flow on color Doppler imaging. Venous Reflux:  None. Other Findings:  None. LEFT LOWER EXTREMITY Common Femoral Vein: No evidence of thrombus. Normal compressibility, respiratory phasicity and response to augmentation. Saphenofemoral Junction: No evidence of thrombus. Normal compressibility and flow on color Doppler imaging. Profunda Femoral Vein: No evidence of thrombus. Normal compressibility and flow on color Doppler imaging. Femoral Vein: No evidence of thrombus. Normal compressibility, respiratory phasicity and response to augmentation. Popliteal Vein: No evidence of thrombus. Normal compressibility, respiratory phasicity and response to augmentation. Calf Veins: No evidence of thrombus. Normal compressibility and flow on color Doppler imaging. Superficial Great Saphenous Vein: No evidence of thrombus. Normal compressibility and flow on color Doppler imaging. Venous Reflux:  None. Other Findings:  None. IMPRESSION: No evidence of deep venous thrombosis within either lower extremity. Electronically Signed   By: Lowella Grip III M.D.   On: 03/17/2017 09:07   Dg Chest Portable 1 View  Result Date: 03/16/2017 CLINICAL DATA:  Short of breath EXAM: PORTABLE CHEST 1 VIEW COMPARISON:  03/09/2017 FINDINGS:  Normal heart size. Lungs clear. No pneumothorax. No pleural effusion. IMPRESSION: No active disease. Electronically Signed   By: Marybelle Killings M.D.   On: 03/16/2017 08:00   Dg Chest Portable 1 View  Result Date: 03/09/2017 CLINICAL DATA:  81 year old female with shortness of breath and wheezing since yesterday. Initial encounter. EXAM: PORTABLE CHEST 1 VIEW COMPARISON:  12/28/2016 and 11/16/2016 chest x-ray. 11/19/2016 chest CT. FINDINGS: Pulmonary vascular congestion. Minimal peribronchial thickening without segmental consolidation. No pneumothorax. Heart size within  normal limits. Calcified aorta. IMPRESSION: Pulmonary vascular congestion. Minimal peribronchial thickening probably related to degree of pulmonary vascular congestion rather than bronchitis. No segmental consolidation. Aortic atherosclerosis. Electronically Signed   By: Genia Del M.D.   On: 03/09/2017 09:02     Echo:- Left ventricle: The cavity size was normal. Wall thickness was   increased in a pattern of moderate LVH. Systolic function was   vigorous. The estimated ejection fraction was in the range of 65%   to 70%. There is a subvalvular gradient created by global   moderate LVH, hyperdynamic LV, and SAM. Evaluation of gradient is   limited, peak systolic gradient approximately 70 mmHg at rest. No   provocative maneuvers were performed. Diastolic function is   abnormal, indeterminant grade. Wall motion was normal; there were   no regional wall motion abnormalities. - Aortic valve: The AV Doppler signals are contaminated by the   subvalvular dynamic obstruction, cannot measure AV pressure   gradients or velocity. Grossly there is no significant stenosis   or regurgitation. Elevated gradients across the valve appear to   originate subvalvular due to hyperdynamic LV, moderate global   LVH, and SAM. - Mitral valve: There was mild regurgitation   Subjective: Breathing is improving. No chest pain.  Discharge Exam: Vitals:   03/19/17 1303 03/19/17 1428  BP:  (!) 126/55  Pulse: (!) 110 89  Resp:  20  Temp:     Vitals:   03/19/17 0810 03/19/17 1303 03/19/17 1428 03/19/17 1439  BP:   (!) 126/55   Pulse:  (!) 110 89   Resp:   20   Temp:      TempSrc:      SpO2: 100% 93% 94% 96%  Weight:      Height:        General: Pt is alert, awake, not in acute distress Cardiovascular: RRR, S1/S2 +, no rubs, no gallops Respiratory: CTA bilaterally, no wheezing, no rhonchi Abdominal: Soft, NT, ND, bowel sounds + Extremities: no edema, no cyanosis    The results of significant  diagnostics from this hospitalization (including imaging, microbiology, ancillary and laboratory) are listed below for reference.     Microbiology: No results found for this or any previous visit (from the past 240 hour(s)).   Labs: BNP (last 3 results)  Recent Labs  03/09/17 0834  BNP 616.0*   Basic Metabolic Panel:  Recent Labs Lab 03/16/17 0917 03/17/17 0456 03/18/17 0636  NA 139 138 139  K 3.4* 2.9* 4.0  CL 93* 103 109  CO2 34* 26 25  GLUCOSE 135* 169* 157*  BUN 49* 33* 20  CREATININE 1.54* 0.99 0.86  CALCIUM 9.2 8.2* 8.1*  MG  --   --  2.4   Liver Function Tests:  Recent Labs Lab 03/16/17 0917 03/17/17 0456  AST 32 30  ALT 28 8*  ALKPHOS 47 39  BILITOT 1.5* 1.1  PROT 6.8 5.1*  ALBUMIN 4.1 3.0*   No results for input(s): LIPASE, AMYLASE in  the last 168 hours. No results for input(s): AMMONIA in the last 168 hours. CBC:  Recent Labs Lab 03/16/17 0917 03/17/17 0456 03/18/17 0636  WBC 14.4* 4.9 6.8  NEUTROABS 10.3*  --   --   HGB 17.2* 14.1 12.7  HCT 50.8* 43.0 38.9  MCV 90.7 91.1 92.0  PLT 224 177 157   Cardiac Enzymes:  Recent Labs Lab 03/16/17 0917 03/16/17 1632 03/16/17 2127 03/17/17 0456  TROPONINI 0.09* 0.11* 0.09* 0.06*   BNP: Invalid input(s): POCBNP CBG: No results for input(s): GLUCAP in the last 168 hours. D-Dimer No results for input(s): DDIMER in the last 72 hours. Hgb A1c No results for input(s): HGBA1C in the last 72 hours. Lipid Profile No results for input(s): CHOL, HDL, LDLCALC, TRIG, CHOLHDL, LDLDIRECT in the last 72 hours. Thyroid function studies No results for input(s): TSH, T4TOTAL, T3FREE, THYROIDAB in the last 72 hours.  Invalid input(s): FREET3 Anemia work up No results for input(s): VITAMINB12, FOLATE, FERRITIN, TIBC, IRON, RETICCTPCT in the last 72 hours. Urinalysis    Component Value Date/Time   COLORURINE YELLOW 02/17/2017 1229   APPEARANCEUR CLEAR 02/17/2017 1229   LABSPEC 1.015 02/17/2017 1229    PHURINE 6.5 02/17/2017 1229   GLUCOSEU NEGATIVE 02/17/2017 1229   HGBUR TRACE (A) 02/17/2017 1229   BILIRUBINUR NEGATIVE 02/17/2017 1229   KETONESUR NEGATIVE 02/17/2017 1229   PROTEINUR NEGATIVE 02/17/2017 1229   UROBILINOGEN 0.2 04/16/2015 2310   NITRITE NEGATIVE 02/17/2017 1229   LEUKOCYTESUR NEGATIVE 02/17/2017 1229   Sepsis Labs Invalid input(s): PROCALCITONIN,  WBC,  LACTICIDVEN Microbiology No results found for this or any previous visit (from the past 240 hour(s)).   Time coordinating discharge: Over 30 minutes  SIGNED:   Kathie Dike, MD  [facility] 03/19/2017, 6:52 PM Pager   If 7PM-7AM, please contact night-coverage [email or web address] Password [email or web address]

## 2017-03-19 NOTE — Progress Notes (Signed)
*  PRELIMINARY RESULTS* Echocardiogram 2D Echocardiogram has been performed.  Leavy Cella 03/19/2017, 10:25 AM

## 2017-03-19 NOTE — Progress Notes (Signed)
Fairly elevated subvalvular gradient of 70 mmHg by echo in setting of hyperdynamic LV, global hyertrophy, and SAM. We will start dilt 30mg  bid, would avoid beta blocker given her lung disease, though appears primarily to be restrictive lung process, would still favor CCB.   J Tandrea Kommer MD

## 2017-03-19 NOTE — Progress Notes (Signed)
AVS reviewed with patient and patient's husband.  Verbalized understanding of discharge instructions, physician follow-up, medications.  Patient transported by NT via wheelchair to entrance at discharge.  Patient stable at time of discharge.

## 2017-03-22 DIAGNOSIS — G629 Polyneuropathy, unspecified: Secondary | ICD-10-CM | POA: Diagnosis not present

## 2017-03-22 DIAGNOSIS — Z7951 Long term (current) use of inhaled steroids: Secondary | ICD-10-CM | POA: Diagnosis not present

## 2017-03-22 DIAGNOSIS — G903 Multi-system degeneration of the autonomic nervous system: Secondary | ICD-10-CM | POA: Diagnosis not present

## 2017-03-22 DIAGNOSIS — I10 Essential (primary) hypertension: Secondary | ICD-10-CM | POA: Diagnosis not present

## 2017-03-22 DIAGNOSIS — E43 Unspecified severe protein-calorie malnutrition: Secondary | ICD-10-CM | POA: Diagnosis not present

## 2017-03-22 DIAGNOSIS — K219 Gastro-esophageal reflux disease without esophagitis: Secondary | ICD-10-CM | POA: Diagnosis not present

## 2017-03-22 DIAGNOSIS — Z7982 Long term (current) use of aspirin: Secondary | ICD-10-CM | POA: Diagnosis not present

## 2017-03-22 DIAGNOSIS — J441 Chronic obstructive pulmonary disease with (acute) exacerbation: Secondary | ICD-10-CM | POA: Diagnosis not present

## 2017-03-22 DIAGNOSIS — I248 Other forms of acute ischemic heart disease: Secondary | ICD-10-CM | POA: Diagnosis not present

## 2017-03-25 ENCOUNTER — Encounter: Payer: Self-pay | Admitting: Acute Care

## 2017-03-25 ENCOUNTER — Ambulatory Visit (INDEPENDENT_AMBULATORY_CARE_PROVIDER_SITE_OTHER): Payer: Medicare Other | Admitting: Acute Care

## 2017-03-25 DIAGNOSIS — G232 Striatonigral degeneration: Secondary | ICD-10-CM | POA: Diagnosis not present

## 2017-03-25 DIAGNOSIS — J9601 Acute respiratory failure with hypoxia: Secondary | ICD-10-CM | POA: Diagnosis not present

## 2017-03-25 DIAGNOSIS — E43 Unspecified severe protein-calorie malnutrition: Secondary | ICD-10-CM

## 2017-03-25 NOTE — Progress Notes (Signed)
History of Present Illness Marie [name] is a 81 y.o. female never smoker with wheezing, mucous production ,dyspnea, restrictive lung disease and dysphagia.She is followed by Dr. Lake Bells.   Synopsis: Referred in 2018 for evaluation of wheezing, mucus production and dyspnea.  She was noted to have restrictive lung disease and dysphagia.   03/25/2017 Hospital Follow Up.  Pt. Presents for hospital follow up. She was admitted from 5/29-03/19/2017 for   Acute respiratory failure, dehydration and weakness. She was treated with IV steroids,IV antibiotics,scheduled BD's and acute bronchitis.      She states she is feeling much better.She is compliant with her Advair discus and her hypertonic saline nebulizers.She is compliant with her lasix daily.She denies chest pain, fever, orthopnea or hemoptysis. She has noticed improvement in regard to decreased secretions, and secretions that are clear in color.  This patient was seen by Dr. Lake Bells on 02/24/2017 prior to hospital admission. She was referred at that time for evaluation of wheezing, mucus production and dyspnea. Additionally she had a recent weight loss of 35 pounds. She was noted at that time to have restrictive lung disease and dysphasia. She states that she is coughing up less mucus since hospitalization. She states her wheezing has improved with use of Advair and hypertonic saline nebulizer treatments. She states she has not really used the albuterol nebs as they make her jittery. After initial visit on 02/24/17 Dr. Lake Bells had concerns that she had an underlying neuromuscular problem that was contributing to her swallowing issues. plan was referral and follow-up with neurology for nerve conduction study, and repeat pulmonary function testing in August 2018 to monitor her restrictive lung disease. Additionally he started hypertonic saline. She was evaluated by Dr. Rochele Raring regarding concern for neuromuscular disease. She completed nerve conduction  studies and EMG studies on 03/02/2017. The patient then returned to Dr. Serita Grit office for review of results on 03/04/2017. Impression and plan were as follows:  IMPRESSION/PLAN: 1.  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 There was no evidence of a neuromuscular junction disorder, myopathy, or ALS on her electrodiagnostic testing.  She will be started on a tri al of Sinemet 25/100 half a tablet 3 times a day and titrate to 1 tablet 3 times a day.  She has difficulty swallowing capsulated medications, and I have asked her to discuss with her respective providers to transition her to liquid solution or tablets.  2.  Generalized hyperreflexia, possibly due to cervical canal stenosis.  If she has new arm weakness/parestheisas,  I will obtain MRI cervical spine  3  Idiopathic peripheral neuropathy, which is been long-standing and manifesting with numbness and gait ataxia.  Continue to use rollator.  4.  Cough, wheezing, and restrictive lung disease. This may certainly be related to parkinson-plus syndrome.  Appreciate pulmonology assistance with symptom management.  Patient is to go for follow-up in 3 months.   She states she has not noticed any improvement in her dysphagia since addition of Sinemet 3 times daily.  Test Results:  NCV and EMG findings 03/02/2017 Impression: 1. There is evidence of a length dependent sensorimotor polyneuropathy, axon loss in type, affecting the upper and lower extremities; moderate in degree electrically. 2. Chronic motor axon loss changes seen in the bulbar muscles. In isolation, these findings are of unclear clinical significance. Correlate clinically. 3. There is no evidence of a neuromuscular junction disorder, widespread  disorder of anterior horn cells, or cervical/lumbosacral  radiculopathy affecting the right side.    CBC Latest Ref Rng & Units 03/18/2017 03/17/2017 03/16/2017  WBC 4.0 - 10.5 K/uL 6.8 4.9 14.4(H)  Hemoglobin 12.0 - 15.0 g/dL 12.7 14.1 17.2(H)  Hematocrit 36.0 - 46.0 % 38.9 43.0 50.8(H)  Platelets 150 - 400 K/uL 157 177 224    BMP Latest Ref Rng & Units 03/18/2017 03/17/2017 03/16/2017  Glucose 65 - 99 mg/dL 157(H) 169(H) 135(H)  BUN 6 - 20 mg/dL 20 33(H) 49(H)  Creatinine 0.44 - 1.00 mg/dL 0.86 0.99 1.54(H)  Sodium 135 - 145 mmol/L 139 138 139  Potassium 3.5 - 5.1 mmol/L 4.0 2.9(L) 3.4(L)  Chloride 101 - 111 mmol/L 109 103 93(L)  CO2 22 - 32 mmol/L 25 26 34(H)  Calcium 8.9 - 10.3 mg/dL 8.1(L) 8.2(L) 9.2    BNP    Component Value Date/Time   BNP 109.0 (H) 03/09/2017 0834   BNP 33.0 01/15/2014 1103    ProBNP    Component Value Date/Time   PROBNP 101.9 12/05/2013 1154    PFT    Component Value Date/Time   FEV1PRE 1.25 11/27/2016 1337   FEV1POST 1.24 11/27/2016 1337   FVCPRE 1.61 11/27/2016 1337   FVCPOST 1.50 11/27/2016 1337   TLC 3.31 11/27/2016 1337   DLCOUNC 10.59 11/27/2016 1337   PREFEV1FVCRT 78 11/27/2016 1337   PSTFEV1FVCRT 83 11/27/2016 1337    Nm Pulmonary Perf And Vent  Result Date: 03/16/2017 CLINICAL DATA:  Acute onset of shortness of breath and cough. Initial encounter. EXAM: NUCLEAR MEDICINE VENTILATION - PERFUSION LUNG SCAN TECHNIQUE: Ventilation images were obtained in multiple projections using inhaled aerosol Tc-47m DTPA. Perfusion images were obtained in multiple projections after intravenous injection of Tc-18m MAA. RADIOPHARMACEUTICALS:  30 mCi Technetium-32m DTPA aerosol inhalation and 4.3 mCi Technetium-54m MAA IV COMPARISON:  Chest radiograph performed earlier today at 7:44 a.m. FINDINGS: Ventilation: There are multiple large ventilation defects throughout both lungs, suspicious for underlying emphysema. There is suggestion of artifactual accumulation of activity along the tracheobronchial tree. Perfusion:  No significant wedge shaped peripheral perfusion defects to suggest acute pulmonary embolism. Perfusion within both lungs is somewhat heterogeneous in appearance, with small subsegmental filling defects noted bilaterally. IMPRESSION: Low probability for pulmonary embolus. Significantly suboptimal ventilation images raises question for underlying emphysema. Electronically Signed   By: Garald Balding M.D.   On: 03/16/2017 23:44   US Venous Img Lower Bilateral  Result Date: 03/17/2017 CLINICAL DATA:  Hypoxia EXAM: BILATERAL LOWER EXTREMITY VENOUS DUPLEX ULTRASOUND TECHNIQUE: Gray-scale sonography with graded compression, as well as color Doppler and duplex ultrasound were performed to evaluate the lower extremity deep venous systems from the level of the common femoral vein and including the common femoral, femoral, profunda femoral, popliteal and calf veins including the posterior tibial, peroneal and gastrocnemius veins when visible. The superficial great saphenous vein was also interrogated. Spectral Doppler was utilized to evaluate flow at rest and with distal augmentation maneuvers in the common femoral, femoral and popliteal veins. COMPARISON:  None. FINDINGS: RIGHT LOWER EXTREMITY Common Femoral Vein: No evidence of thrombus. Normal compressibility, respiratory phasicity and response to augmentation. Saphenofemoral Junction: No evidence of thrombus. Normal compressibility and flow on color Doppler imaging. Profunda Femoral Vein: No evidence of thrombus. Normal compressibility and flow on color Doppler imaging. Femoral Vein: No evidence of thrombus. Normal compressibility, respiratory phasicity and response to augmentation. Popliteal Vein: No evidence of thrombus. Normal compressibility, respiratory phasicity and response to augmentation. Calf Veins: No evidence  of thrombus. Normal compressibility and flow on color Doppler imaging. Superficial Great Saphenous Vein: No evidence of thrombus. Normal  compressibility and flow on color Doppler imaging. Venous Reflux:  None. Other Findings:  None. LEFT LOWER EXTREMITY Common Femoral Vein: No evidence of thrombus. Normal compressibility, respiratory phasicity and response to augmentation. Saphenofemoral Junction: No evidence of thrombus. Normal compressibility and flow on color Doppler imaging. Profunda Femoral Vein: No evidence of thrombus. Normal compressibility and flow on color Doppler imaging. Femoral Vein: No evidence of thrombus. Normal compressibility, respiratory phasicity and response to augmentation. Popliteal Vein: No evidence of thrombus. Normal compressibility, respiratory phasicity and response to augmentation. Calf Veins: No evidence of thrombus. Normal compressibility and flow on color Doppler imaging. Superficial Great Saphenous Vein: No evidence of thrombus. Normal compressibility and flow on color Doppler imaging. Venous Reflux:  None. Other Findings:  None. IMPRESSION: No evidence of deep venous thrombosis within either lower extremity. Electronically Signed   By: Lowella Grip III M.D.   On: 03/17/2017 09:07   Dg Chest Portable 1 View  Result Date: 03/16/2017 CLINICAL DATA:  Short of breath EXAM: PORTABLE CHEST 1 VIEW COMPARISON:  03/09/2017 FINDINGS: Normal heart size. Lungs clear. No pneumothorax. No pleural effusion. IMPRESSION: No active disease. Electronically Signed   By: Marybelle Killings M.D.   On: 03/16/2017 08:00   Dg Chest Portable 1 View  Result Date: 03/09/2017 CLINICAL DATA:  81 year old female with shortness of breath and wheezing since yesterday. Initial encounter. EXAM: PORTABLE CHEST 1 VIEW COMPARISON:  12/28/2016 and 11/16/2016 chest x-ray. 11/19/2016 chest CT. FINDINGS: Pulmonary vascular congestion. Minimal peribronchial thickening without segmental consolidation. No pneumothorax. Heart size within normal limits. Calcified aorta. IMPRESSION: Pulmonary vascular congestion. Minimal peribronchial thickening probably  related to degree of pulmonary vascular congestion rather than bronchitis. No segmental consolidation. Aortic atherosclerosis. Electronically Signed   By: Genia Del M.D.   On: 03/09/2017 09:02     Past medical hx Past Medical History:  Diagnosis Date  . Balance disorder 2008.     Falls a lot.  She can be standing and then leans too far over to one side   . Bulging disc   . Chronic bronchitis ([place])    due to congestion at times, on prednisone and advair  . COPD (chronic obstructive pulmonary disease) ([place])   . Depression    many years ago  . GERD (gastroesophageal reflux disease)   . Hearing loss    mild  . Hyperlipidemia   . Hypertension   . Iatrogenic adrenal insufficiency ([place])   . Incontinence    not indicated at this visit.  Marland Kitchen Neuropathy    legs stay numb   . Osteoarthritis    hands,   . Osteoporosis   . Shortness of breath dyspnea   . Tubulovillous adenoma of rectum   . Wears dentures   . Wears glasses      Social History  Substance Use Topics  . Smoking status: Never Smoker  . Smokeless tobacco: Never Used  . Alcohol use No    Tobacco Cessation: Counseling given: Yes Never smoker  Past surgical hx, Family hx, Social hx all reviewed.  Current Outpatient Prescriptions on File Prior to Visit  Medication Sig  . ADVAIR DISKUS 250-50 MCG/DOSE AEPB INHALE ONE PUFF BY MOUTH TWICE DAILY  . albuterol (PROVENTIL) (2.5 MG/3ML) 0.083% nebulizer solution USE ONE VIAL IN NEBULIZER EVERY 6 HOURS AS NEEDED FOR WHEEZING OR SHORTNESS OF BREATH  . aspirin 81 MG tablet Take 81 mg  by mouth daily. Buys OTC  . benzonatate (TESSALON) 200 MG capsule Take 1 capsule (200 mg total) by mouth 3 (three) times daily as needed for cough.  . carbidopa-levodopa (SINEMET IR) 25-100 MG tablet Take 1 tablet by mouth 3 (three) times daily.  . Cholecalciferol (VITAMIN D) 2000 units CAPS Take 2,000 Units by mouth daily.  Marland Kitchen diltiazem (CARDIZEM) 30 MG tablet Take 1 tablet (30 mg total) by mouth  2 (two) times daily.  . famotidine (PEPCID) 20 MG tablet Take 1 tablet (20 mg total) by mouth 2 (two) times daily.  . furosemide (LASIX) 40 MG tablet Take 0.5 tablets (20 mg total) by mouth daily as needed for fluid.  Marland Kitchen guaiFENesin (MUCINEX) 600 MG 12 hr tablet Take 2 tablets (1,200 mg total) by mouth 2 (two) times daily.  . montelukast (SINGULAIR) 10 MG tablet Take 10 mg by mouth at bedtime.  Marland Kitchen nystatin (MYCOSTATIN) 100000 UNIT/ML suspension Take 5 mLs (500,000 Units total) by mouth 4 (four) times daily. (Patient taking differently: Take 5 mLs by mouth 2 (two) times daily. )  . omeprazole (PRILOSEC) 40 MG capsule Take 1 capsule (40 mg total) by mouth 2 (two) times daily.  . pravastatin (PRAVACHOL) 40 MG tablet Take 20 mg by mouth daily.  . predniSONE (DELTASONE) 10 MG tablet Take 40mg  po daily for 2 days then 30mg  daily for 2 days then 20mg  daily for 2 days then 10mg  daily  . sodium chloride HYPERTONIC 3 % nebulizer solution Take 1 vial by nebulization BID  . traZODone (DESYREL) 50 MG tablet Take 25 mg by mouth at bedtime.  . potassium chloride (K-DUR) 10 MEQ tablet Take 1 tablet (10 mEq total) by mouth daily.   No current facility-administered medications on file prior to visit.      Allergies  Allergen Reactions  . Ertapenem Hives    Caused whole body to turn red Other reaction(s): Redness Caused whole body to turn red  . Codeine Other (See Comments)    hallucinations  . Demerol Other (See Comments)    hallucinations  . Morphine And Related Other (See Comments)    hallucinations  . Other Other (See Comments)    Invan 7- pt became red all over  . Sulfate Other (See Comments)    unknown    Review Of Systems:  Constitutional:   No  weight loss, night sweats,  Fevers, chills, +fatigue, or  lassitude.  HEENT:   No headaches,  Difficulty swallowing,  Tooth/dental problems, or  Sore throat,                No sneezing, itching, ear ache, nasal congestion, post nasal drip,   CV:   No chest pain,  Orthopnea, PND, swelling in lower extremities, anasarca, dizziness, palpitations, syncope.   GI  No heartburn, indigestion, abdominal pain, nausea, vomiting, diarrhea, change in bowel habits, loss of appetite, bloody stools.   Resp: + shortness of breath with exertion or at rest.  Baseline excess mucus, baseline productive cough,  No non-productive cough,  No coughing up of blood.  No change in color of mucus.  + wheezing.  No chest wall deformity  Skin: no rash or lesions.  GU: no dysuria, change in color of urine, no urgency or frequency.  No flank pain, no hematuria   MS:  No joint pain or swelling. + decreased range of motion.  No back pain.  Psych:  No change in mood or affect. No depression or anxiety.  No memory loss.  Vital Signs BP 112/78 (BP Location: Right Arm, Cuff Size: Normal)   Pulse 74   Resp 18   Ht 5\' 4"  (1.626 m)   Wt 148 lb 9.6 oz (67.4 kg)   SpO2 95%   BMI 25.51 kg/m    Body mass index is 25.51 kg/m.  Physical Exam:  General- No distress,  A&Ox3, pleasant, chronically ill-appearing, walks with walker ENT: No sinus tenderness, TM clear, pale nasal mucosa, no oral exudate,no post nasal drip, no LAN Cardiac: S1, S2, regular rate and rhythm, no murmur Chest: Slight expiratory wheeze/ no rales/ dullness; no accessory muscle use, no nasal flaring, no sternal retractions, diminished per bases Abd.: Soft Non-tender Ext: No clubbing cyanosis, trace edema lower extremities Neuro:  Deconditioned at baseline, moving all extremities 4, right hand tremor Skin: No rashes, warm and dry Psych: normal mood and behavior, asking very appropriate questions   Assessment/Plan  Acute respiratory failure (HCC) Resolved after hospitalization Plan We will schedule you for a Pulmonary Function test  August  Continue your Advair and Saline nebs as you have been doing. Remember to rinse mouth after use of any inhaled corticosteroid. Remember you can use the  a;lbuterol nebs if you have breakthrough shortness of breath. Follow up with Dr. Posey Pronto in August. Continue medication in liquid form when possible Follow up with [facility] 06/15/2017 with PFT's prior as is already scheduled. Please contact office for sooner follow up if symptoms do not improve or worsen or seek emergency care    Parkinson's plus syndrome (HCC) Continue Sinemet 25/100  one tablet 3 times daily per Dr. Posey Pronto Follow-up with Dr. Posey Pronto in August as is scheduled Continue with liquid medications when able Please let Dr. Posey Pronto or Dr. Lake Bells know if you are having worsening problems with swallowing Please contact office for sooner follow up if symptoms do not improve or worsen or seek emergency care   Protein-calorie malnutrition, severe Monitor weight Meal supplements Consider dietary consult    Magdalen Spatz, NP 03/25/2017  9:17 PM

## 2017-03-25 NOTE — Patient Instructions (Addendum)
It is nice to meet you today. We will schedule you for a Pulmonary Function test  August  Continue your Advair and Saline nebs as you have been doing. Remember to rinse mouth after use. Remember you can use the a;lbuterol nebs if you have breakthrough shortness of breath. Follow up with Dr. Posey Pronto in August. Continue medication in liquid form when possible Follow up with [facility] 06/15/2017 with PFT's prior as is already scheduled. Please contact office for sooner follow up if symptoms do not improve or worsen or seek emergency care

## 2017-03-25 NOTE — Assessment & Plan Note (Signed)
Resolved after hospitalization Plan We will schedule you for a Pulmonary Function test  August  Continue your Advair and Saline nebs as you have been doing. Remember to rinse mouth after use of any inhaled corticosteroid. Remember you can use the a;lbuterol nebs if you have breakthrough shortness of breath. Follow up with Dr. Posey Pronto in August. Continue medication in liquid form when possible Follow up with [facility] 06/15/2017 with PFT's prior as is already scheduled. Please contact office for sooner follow up if symptoms do not improve or worsen or seek emergency care

## 2017-03-25 NOTE — Assessment & Plan Note (Signed)
Continue Sinemet 25/100  one tablet 3 times daily per Dr. Posey Pronto Follow-up with Dr. Posey Pronto in August as is scheduled Continue with liquid medications when able Please let Dr. Posey Pronto or Dr. Lake Bells know if you are having worsening problems with swallowing Please contact office for sooner follow up if symptoms do not improve or worsen or seek emergency care

## 2017-03-25 NOTE — Assessment & Plan Note (Signed)
Monitor weight Meal supplements Consider dietary consult

## 2017-03-26 DIAGNOSIS — G903 Multi-system degeneration of the autonomic nervous system: Secondary | ICD-10-CM | POA: Diagnosis not present

## 2017-03-26 DIAGNOSIS — E43 Unspecified severe protein-calorie malnutrition: Secondary | ICD-10-CM | POA: Diagnosis not present

## 2017-03-26 DIAGNOSIS — J441 Chronic obstructive pulmonary disease with (acute) exacerbation: Secondary | ICD-10-CM | POA: Diagnosis not present

## 2017-03-26 DIAGNOSIS — I248 Other forms of acute ischemic heart disease: Secondary | ICD-10-CM | POA: Diagnosis not present

## 2017-03-26 DIAGNOSIS — I10 Essential (primary) hypertension: Secondary | ICD-10-CM | POA: Diagnosis not present

## 2017-03-26 DIAGNOSIS — G629 Polyneuropathy, unspecified: Secondary | ICD-10-CM | POA: Diagnosis not present

## 2017-03-28 NOTE — Progress Notes (Signed)
Cardiology Office Note    Date:  03/29/2017   ID:  Marie [name], DOB 1936/04/27, MRN [id number]  PCP:  [name], MD  Cardiologist: Dr. Debara Pickett   Chief Complaint  Patient presents with  . Hospitalization Follow-up    History of Present Illness:    Marie [name] is a 81 y.o. female with past medical history of HTN, hypertensive heart disease, Parkinson's syndrome, and known restrictive lung disease who presents to the office today for hospital follow-up.   She was examined by Dr. Debara Pickett in 12/2016 and as doing well from a cardiac perspective at that time. She was admitted from 5/22 - 03/10/2017 for a COPD exacerbation. She presented back to the ED on 5/29 for worsening dyspnea and was again admitted from 5/29 - 03/19/2017 for acute respiratory failure. CXR showed no evidence of PNA and VQ Scan was low probability for a VQ Scan. Troponin values were mildly elevated at 0.09, 0.11, 0.09, and 0.06. Cardiology was consulted and her values were thought to be secondary to demand ischemia. An echocardiogram showed a preserved EF of 65-70% with moderate LVH, hyperdynamic LV, SAM, and mild MR. She was started on Diltiazem 30mg  BID with plans to avoid BB therapy in the setting of her lung disease.   In talking with the patient today, she reports significant improvement in her respiratory status since her recent hospitalization. She has baseline dyspnea on exertion but denies any acute worsening of this. No associated orthopnea, PND, or lower extremity edema. She denies any recent chest discomfort, palpitations, lightheadedness, dizziness, or presyncope.  Prior to her hospitalization, she was on Losartan-HCTZ but this was discontinued as she was placed on PRN Lasix for lower extremity edema. Her husband is unsure if she has continued on Losartan-HCTZ. She reports good compliance with Diltiazem and denies any side-effects to this.    Past Medical History:  Diagnosis Date  . Balance disorder  2008.     Falls a lot.  She can be standing and then leans too far over to one side   . Bulging disc   . Chronic bronchitis ([place])    due to congestion at times, on prednisone and advair  . COPD (chronic obstructive pulmonary disease) ([place])   . Depression    many years ago  . GERD (gastroesophageal reflux disease)   . Hearing loss    mild  . Hyperlipidemia   . Hypertension   . Iatrogenic adrenal insufficiency ([place])   . Incontinence    not indicated at this visit.  Marland Kitchen LVH (left ventricular hypertrophy) due to hypertensive disease    a. 02/2017: echo showing an EF of 65-70% with moderate LVH, hyperdynamic LV with subvalvular gradient of 70 mmHg, SAM, and mild MR  . Neuropathy    legs stay numb   . Osteoarthritis    hands,   . Osteoporosis   . Shortness of breath dyspnea   . Tubulovillous adenoma of rectum   . Wears dentures   . Wears glasses     Past Surgical History:  Procedure Laterality Date  . ABDOMINAL HYSTERECTOMY    . APPENDECTOMY    . CHONDROPLASTY  09/19/2014   Procedure: CHONDROPLASTY;  Surgeon: Alta Corning, MD;  Location: [facility];  Service: Orthopedics;;  . DILATION AND CURETTAGE OF UTERUS    . EXTERNAL FIXATION LEG  10/25/2012   Procedure: EXTERNAL FIXATION LEG;  Surgeon: Rozanna Box, MD;  Location: [facility];  Service: Orthopedics;  Laterality: Right;  .  EYE SURGERY     bilateral cataract surgery and lens implant  . FINGER SURGERY     fusions and debridements for OA  . INCONTINENCE SURGERY     multiple procedures, not cured  . KNEE ARTHROSCOPY WITH LATERAL MENISECTOMY Right 09/19/2014   Procedure: KNEE ARTHROSCOPY WITH LATERAL MENISECTOMY;  Surgeon: Alta Corning, MD;  Location: [facility];  Service: Orthopedics;  Laterality: Right;  . KNEE ARTHROSCOPY WITH MEDIAL MENISECTOMY Right 09/19/2014   Procedure: RIGHT KNEE ARTHROSCOPY WITH MEDIAL AND LATERAL MENISECTOMIES. CHONDROPLASTY OF PATELLA-FEMORAL JOINT;  Surgeon: Alta Corning,  MD;  Location: [facility];  Service: Orthopedics;  Laterality: Right;  . RECTAL BIOPSY  09/21/2011   Procedure: BIOPSY RECTAL;  Surgeon: Merrie Roof, MD;  Location: [facility];  Service: General;  Laterality: N/A;  3-4 cm  . RECTAL SURGERY     by dr. Marlou Starks, removal of polyp  . TONSILLECTOMY      Current Medications: Outpatient Medications Prior to Visit  Medication Sig Dispense Refill  . ADVAIR DISKUS 250-50 MCG/DOSE AEPB INHALE ONE PUFF BY MOUTH TWICE DAILY 60 each 1  . albuterol (PROVENTIL) (2.5 MG/3ML) 0.083% nebulizer solution USE ONE VIAL IN NEBULIZER EVERY 6 HOURS AS NEEDED FOR WHEEZING OR SHORTNESS OF BREATH 75 mL 4  . aspirin 81 MG tablet Take 81 mg by mouth daily. Buys OTC    . benzonatate (TESSALON) 200 MG capsule Take 1 capsule (200 mg total) by mouth 3 (three) times daily as needed for cough. 60 capsule 0  . carbidopa-levodopa (SINEMET IR) 25-100 MG tablet Take 1 tablet by mouth 3 (three) times daily. 90 tablet 5  . Cholecalciferol (VITAMIN D) 2000 units CAPS Take 2,000 Units by mouth daily.    . famotidine (PEPCID) 20 MG tablet Take 1 tablet (20 mg total) by mouth 2 (two) times daily. 60 tablet 0  . furosemide (LASIX) 40 MG tablet Take 0.5 tablets (20 mg total) by mouth daily as needed for fluid. 30 tablet   . guaiFENesin (MUCINEX) 600 MG 12 hr tablet Take 2 tablets (1,200 mg total) by mouth 2 (two) times daily. 30 tablet 0  . montelukast (SINGULAIR) 10 MG tablet Take 10 mg by mouth at bedtime.    Marland Kitchen nystatin (MYCOSTATIN) 100000 UNIT/ML suspension Take 5 mLs (500,000 Units total) by mouth 4 (four) times daily. (Patient taking differently: Take 5 mLs by mouth 2 (two) times daily. ) 240 mL 3  . omeprazole (PRILOSEC) 40 MG capsule Take 1 capsule (40 mg total) by mouth 2 (two) times daily. 60 capsule 4  . pravastatin (PRAVACHOL) 40 MG tablet Take 20 mg by mouth daily.    . predniSONE (DELTASONE) 10 MG tablet Take 40mg  po daily for 2 days then 30mg  daily for 2 days then  20mg  daily for 2 days then 10mg  daily 20 tablet 0  . sodium chloride HYPERTONIC 3 % nebulizer solution Take 1 vial by nebulization BID 240 mL 11  . traZODone (DESYREL) 50 MG tablet Take 25 mg by mouth at bedtime.    Marland Kitchen diltiazem (CARDIZEM) 30 MG tablet Take 1 tablet (30 mg total) by mouth 2 (two) times daily. 60 tablet 0  . potassium chloride (K-DUR) 10 MEQ tablet Take 1 tablet (10 mEq total) by mouth daily. 7 tablet 0   No facility-administered medications prior to visit.      Allergies:   Ertapenem; Codeine; Demerol; Morphine and related; Other; and Sulfate   Social History  Social History  . Marital status: Married    Spouse name: N/A  . Number of children: 1  . Years of education: 1   Social History Main Topics  . Smoking status: Never Smoker  . Smokeless tobacco: Never Used  . Alcohol use No  . Drug use: No  . Sexual activity: Not Asked   Other Topics Concern  . None   Social History Narrative   Lives in a house that is mostly single story.  She uses a cane and a walker which she uses for ambulation.  Drives.  Lives with husband.  Has one daughter.  Retired Building control surveyor.  Education: high school.     Family History:  The patient's family history includes COPD in her mother; Cancer in her sister; Heart attack in her father; High blood pressure in her mother.   Review of Systems:   Please see the history of present illness.     General:  No chills, fever, night sweats or weight changes.  Cardiovascular:  No chest pain, edema, orthopnea, palpitations, paroxysmal nocturnal dyspnea. Positive for dyspnea on exertion (at baseline).  Dermatological: No rash, lesions/masses Respiratory: No cough, dyspnea Urologic: No hematuria, dysuria Abdominal:   No nausea, vomiting, diarrhea, bright red blood per rectum, melena, or hematemesis Neurologic:  No visual changes, wkns, changes in mental status. All other systems reviewed and are otherwise negative except as noted  above.   Physical Exam:    VS:  BP (!) 122/55   Pulse 87   Ht 5\' 4"  (1.626 m)   Wt 146 lb 6.4 oz (66.4 kg)   BMI 25.13 kg/m    General: Well developed, well nourished Caucasian female appearing in no acute distress. Head: Normocephalic, atraumatic, sclera non-icteric, no xanthomas, nares are without discharge.  Neck: No carotid bruits. JVD not elevated.  Lungs: Respirations regular and unlabored, without wheezes or rales.  Heart: Regular rate and rhythm. No S3 or S4.  No rubs, or gallops appreciated. 2/6 SEM best appreciated along RUSB.  Abdomen: Soft, non-tender, non-distended with normoactive bowel sounds. No hepatomegaly. No rebound/guarding. No obvious abdominal masses. Msk:  Strength and tone appear normal for age. No joint deformities or effusions. Extremities: No clubbing or cyanosis. No lower extremity edema.  Distal pedal pulses are 2+ bilaterally. Neuro: Alert and oriented X 3. Moves all extremities spontaneously. No focal deficits noted. Psych:  Responds to questions appropriately with a normal affect. Skin: No rashes or lesions noted  Wt Readings from Last 3 Encounters:  03/29/17 146 lb 6.4 oz (66.4 kg)  03/25/17 148 lb 9.6 oz (67.4 kg)  03/16/17 140 lb 9.6 oz (63.8 kg)     Studies/Labs Reviewed:   EKG:  EKG is not ordered today.   Recent Labs: 02/17/2017: TSH 1.06 03/09/2017: B Natriuretic Peptide 109.0 03/17/2017: ALT 8 03/18/2017: BUN 20; Creatinine, Ser 0.86; Hemoglobin 12.7; Magnesium 2.4; Platelets 157; Potassium 4.0; Sodium 139   Lipid Panel    Component Value Date/Time   CHOL 158 02/17/2017 1229   TRIG 84 02/17/2017 1229   HDL 69 02/17/2017 1229   CHOLHDL 2.3 02/17/2017 1229   VLDL 17 02/17/2017 1229   LDLCALC 72 02/17/2017 1229    Additional studies/ records that were reviewed today include:   Echocardiogram: 03/19/2017 Study Conclusions  - Left ventricle: The cavity size was normal. Wall thickness was   increased in a pattern of moderate LVH.  Systolic function was   vigorous. The estimated ejection fraction was in the range of 65%  to 70%. There is a subvalvular gradient created by global   moderate LVH, hyperdynamic LV, and SAM. Evaluation of gradient is   limited, peak systolic gradient approximately 70 mmHg at rest. No   provocative maneuvers were performed. Diastolic function is   abnormal, indeterminant grade. Wall motion was normal; there were   no regional wall motion abnormalities. - Aortic valve: The AV Doppler signals are contaminated by the   subvalvular dynamic obstruction, cannot measure AV pressure   gradients or velocity. Grossly there is no significant stenosis   or regurgitation. Elevated gradients across the valve appear to   originate subvalvular due to hyperdynamic LV, moderate global   LVH, and SAM. - Mitral valve: There was mild regurgitation.  Assessment:    1. Hypertensive heart disease without heart failure   2. LVH (left ventricular hypertrophy)   3. Essential hypertension   4. Restrictive lung disease      Plan:   In order of problems listed above:  1. Hypertensive Heart Disease/ LVH - she was admitted twice in 02/2017 for acute respiratory failure. A repeat echocardiogram showed a preserved EF of 65-70% with moderate LVH, hyperdynamic LV with subvalvular gradient of 70 mmHg, SAM, and mild MR. She was started on Diltiazem 30mg  BID with plans to avoid BB therapy in the setting of her lung disease.  - she denies any recent orthopnea, PND, or lower extremity edema. She does not appear volume overloaded by physical examination.  - she reports good compliance with her medication regimen.  - continue Cardizem 30mg  BID as started during her recent hospitalization. Can titrate as needed for improved BP control. Continue PRN Lasix.   2. HTN - BP is well-controlled at 122/55. She reports similar readings at home. - continue Cardizem 30mg  BID and Lasix PRN. I asked her husband to verify that she is  not taking Losartan-HCTZ in the setting of using Lasix as this was discontinued during her recent admission.   3. Restrictive Lung Disease - followed by Pulmonology. Reports her breathing status is at baseline.    Medication Adjustments/Labs and Tests Ordered: Current medicines are reviewed at length with the patient today.  Concerns regarding medicines are outlined above.  Medication changes, Labs and Tests ordered today are listed in the Patient Instructions below. Patient Instructions  Medication Instructions:  CONTINUE DILTIAZEM 30MG  TWICE DAILY  If you need a refill on your cardiac medications before your next appointment, please call your pharmacy.  Follow-Up: Your physician wants you to follow-up in: 6 [place] will receive a reminder letter in the mail two months in advance. If you don't receive a letter, please call our office October to schedule the December 2018 follow-up appointment.   Special Instructions: CHECK BLOOD PRESSURE DAILY IF CONTINUES TO BE CONSISTENTLY >140 OR >90 CALL AND LET us KNOW  Thank you for choosing [facility] at [facility], Erma Heritage, PA-C  03/29/2017 4:25 PM    [facility] [address], [place], [place]  [address] Phone: [phone]; Fax: [phone]  [address], [place], [place] [address] Phone: [phone]

## 2017-03-29 ENCOUNTER — Encounter: Payer: Self-pay | Admitting: Student

## 2017-03-29 ENCOUNTER — Ambulatory Visit (INDEPENDENT_AMBULATORY_CARE_PROVIDER_SITE_OTHER): Payer: Medicare Other | Admitting: Student

## 2017-03-29 VITALS — BP 122/55 | HR 87 | Ht 64.0 in | Wt 146.4 lb

## 2017-03-29 DIAGNOSIS — I517 Cardiomegaly: Secondary | ICD-10-CM

## 2017-03-29 DIAGNOSIS — J984 Other disorders of lung: Secondary | ICD-10-CM | POA: Diagnosis not present

## 2017-03-29 DIAGNOSIS — I119 Hypertensive heart disease without heart failure: Secondary | ICD-10-CM | POA: Diagnosis not present

## 2017-03-29 DIAGNOSIS — I10 Essential (primary) hypertension: Secondary | ICD-10-CM

## 2017-03-29 MED ORDER — DILTIAZEM HCL 30 MG PO TABS: 30.0000 mg | ORAL_TABLET | Freq: Two times a day (BID) | ORAL | 0 refills | Status: DC

## 2017-03-29 NOTE — Patient Instructions (Signed)
Medication Instructions:  CONTINUE DILTIAZEM 30MG  TWICE DAILY  If you need a refill on your cardiac medications before your next appointment, please call your pharmacy.  Follow-Up: Your physician wants you to follow-up in: 6 [place] will receive a reminder letter in the mail two months in advance. If you don't receive a letter, please call our office October to schedule the December 2018 follow-up appointment.   Special Instructions: CHECK BLOOD PRESSURE DAILY IF CONTINUES TO BE CONSISTENTLY >140 & >90 CALL AND LET us KNOW  Thank you for choosing [facility] at [facility]!!

## 2017-03-30 ENCOUNTER — Ambulatory Visit (INDEPENDENT_AMBULATORY_CARE_PROVIDER_SITE_OTHER): Payer: Medicare Other | Admitting: Internal Medicine

## 2017-03-30 ENCOUNTER — Encounter: Payer: Self-pay | Admitting: Internal Medicine

## 2017-03-30 VITALS — BP 110/50 | HR 69 | Temp 96.4°F | Resp 16 | Ht 64.0 in | Wt 146.4 lb

## 2017-03-30 DIAGNOSIS — K219 Gastro-esophageal reflux disease without esophagitis: Secondary | ICD-10-CM

## 2017-03-30 DIAGNOSIS — J9601 Acute respiratory failure with hypoxia: Secondary | ICD-10-CM | POA: Diagnosis not present

## 2017-03-30 DIAGNOSIS — I10 Essential (primary) hypertension: Secondary | ICD-10-CM

## 2017-03-30 DIAGNOSIS — J984 Other disorders of lung: Secondary | ICD-10-CM | POA: Diagnosis not present

## 2017-03-30 DIAGNOSIS — I248 Other forms of acute ischemic heart disease: Secondary | ICD-10-CM | POA: Diagnosis not present

## 2017-03-30 DIAGNOSIS — N179 Acute kidney failure, unspecified: Secondary | ICD-10-CM

## 2017-03-30 DIAGNOSIS — G232 Striatonigral degeneration: Secondary | ICD-10-CM | POA: Diagnosis not present

## 2017-03-30 DIAGNOSIS — Z79899 Other long term (current) drug therapy: Secondary | ICD-10-CM

## 2017-03-30 DIAGNOSIS — E876 Hypokalemia: Secondary | ICD-10-CM | POA: Diagnosis not present

## 2017-03-30 LAB — CBC WITH DIFFERENTIAL/PLATELET
Basophils Absolute: 0 cells/uL (ref 0–200)
Basophils Relative: 0 %
EOS ABS: 154 {cells}/uL (ref 15–500)
Eosinophils Relative: 2 %
HEMATOCRIT: 45.8 % — AB (ref 35.0–45.0)
Hemoglobin: 14.8 g/dL (ref 11.7–15.5)
LYMPHS PCT: 28 %
Lymphs Abs: 2156 cells/uL (ref 850–3900)
MCH: 29.5 pg (ref 27.0–33.0)
MCHC: 32.3 g/dL (ref 32.0–36.0)
MCV: 91.4 fL (ref 80.0–100.0)
MONO ABS: 693 {cells}/uL (ref 200–950)
MPV: 10.4 fL (ref 7.5–12.5)
Monocytes Relative: 9 %
NEUTROS ABS: 4697 {cells}/uL (ref 1500–7800)
Neutrophils Relative %: 61 %
Platelets: 268 10*3/uL (ref 140–400)
RBC: 5.01 MIL/uL (ref 3.80–5.10)
RDW: 14.8 % (ref 11.0–15.0)
WBC: 7.7 10*3/uL (ref 3.8–10.8)

## 2017-03-30 LAB — BASIC METABOLIC PANEL WITH GFR
BUN: 18 mg/dL (ref 7–25)
CHLORIDE: 107 mmol/L (ref 98–110)
CO2: 27 mmol/L (ref 20–31)
CREATININE: 1.04 mg/dL — AB (ref 0.60–0.93)
Calcium: 8.5 mg/dL — ABNORMAL LOW (ref 8.6–10.4)
GFR, Est African American: 59 mL/min — ABNORMAL LOW (ref >=60)
GFR, Est Non African American: 52 mL/min — ABNORMAL LOW (ref >=60)
Glucose, Bld: 95 mg/dL (ref 65–99)
POTASSIUM: 3.7 mmol/L (ref 3.5–5.3)
SODIUM: 144 mmol/L (ref 135–146)

## 2017-03-30 NOTE — Patient Instructions (Signed)

## 2017-03-30 NOTE — Progress Notes (Signed)
[place]     This very nice 81 y.o. MWF was admitted  5/29-03/19/2017 for Acute Respiratory Failure w/ hypoxia, Restrictive Lung Disease, Acute Kidney Injury, Demand Ischemia, Parkinson's (+), GERD, HTN and Hypokalemia and presents for post hospital follow up.  Patient was treated aggressively w/high dose IV steroids, Abx's, inhalent Bronchodilators for an AECB and rapidly improved. W/u was negative for PNA, PE, ACS/CHF w/ EC showing EF 65-70%.  Patient was switched from Losartan to Diltiazem and to avoid BB for her COPD. Patient was contacted post discharge by office staff to assure stability and schedule f/u.      Hospitalization discharge instructions and medications are reconciled with the patient.      Patient is also followed with Hypertension, Hyperlipidemia, Pre-Diabetes and Vitamin D Deficiency.      Patient is treated for HTN (1996) & BP has been controlled at home. Today's BP is at goal 110/50. Patient has had no complaints of any cardiac type chest pain, palpitations, dyspnea/orthopnea/PND, dizziness, claudication, or dependent edema.     Hyperlipidemia is controlled with diet & meds. Patient denies myalgias or other med SE's. Last Lipids were at goal:  Lab Results  Component Value Date   CHOL 158 02/17/2017   HDL 69 02/17/2017   LDLCALC 72 02/17/2017   TRIG 84 02/17/2017   CHOLHDL 2.3 02/17/2017      Also, the patient has history of PreDiabetes with A1c 6.0% in 2011 and has had no symptoms of reactive hypoglycemia, diabetic polys, paresthesias or visual blurring.  Last A1c was at goal: Lab Results  Component Value Date   HGBA1C 5.5 02/17/2017      Further, the patient also has history of Vitamin D Deficiency of "10" in 2008  and supplements vitamin D without any suspected side-effects. Last vitamin D was still low:  Lab Results  Component Value Date   VD25OH 45 02/17/2017   Current Outpatient Prescriptions on File Prior to Visit  Medication Sig  . ADVAIR  DISKUS 250-50 MCG/DOSE AEPB INHALE ONE PUFF BY MOUTH TWICE DAILY  . albuterol (PROVENTIL) (2.5 MG/3ML) 0.083% nebulizer solution USE ONE VIAL IN NEBULIZER EVERY 6 HOURS AS NEEDED FOR WHEEZING OR SHORTNESS OF BREATH  . aspirin 81 MG tablet Take 81 mg by mouth daily. Buys OTC  . benzonatate (TESSALON) 200 MG capsule Take 1 capsule (200 mg total) by mouth 3 (three) times daily as needed for cough.  . carbidopa-levodopa (SINEMET IR) 25-100 MG tablet Take 1 tablet by mouth 3 (three) times daily.  . Cholecalciferol (VITAMIN D) 2000 units CAPS Take 2,000 Units by mouth daily.  Marland Kitchen diltiazem (CARDIZEM) 30 MG tablet Take 1 tablet (30 mg total) by mouth 2 (two) times daily.  . famotidine (PEPCID) 20 MG tablet Take 1 tablet (20 mg total) by mouth 2 (two) times daily.  . furosemide (LASIX) 40 MG tablet Take 0.5 tablets (20 mg total) by mouth daily as needed for fluid.  Marland Kitchen guaiFENesin (MUCINEX) 600 MG 12 hr tablet Take 2 tablets (1,200 mg total) by mouth 2 (two) times daily.  . montelukast (SINGULAIR) 10 MG tablet Take 10 mg by mouth at bedtime.  Marland Kitchen nystatin (MYCOSTATIN) 100000 UNIT/ML suspension Take 5 mLs (500,000 Units total) by mouth 4 (four) times daily. (Patient taking differently: Take 5 mLs by mouth 2 (two) times daily. )  . omeprazole (PRILOSEC) 40 MG capsule Take 1 capsule (40 mg total) by mouth 2 (two) times daily.  . pravastatin (PRAVACHOL) 40 MG tablet Take  20 mg by mouth daily.  . sodium chloride HYPERTONIC 3 % nebulizer solution Take 1 vial by nebulization BID  . traZODone (DESYREL) 50 MG tablet Take 25 mg by mouth at bedtime.  . potassium chloride (K-DUR) 10 MEQ tablet Take 1 tablet (10 mEq total) by mouth daily.   No current facility-administered medications on file prior to visit.    Allergies  Allergen Reactions  . Ertapenem Hives    Caused whole body to turn red Other reaction(s): Redness Caused whole body to turn red  . Codeine Other (See Comments)    hallucinations  . Demerol Other  (See Comments)    hallucinations  . Morphine And Related Other (See Comments)    hallucinations  . Other Other (See Comments)    Invan 7- pt became red all over  . Sulfate Other (See Comments)    unknown   PMHx:   Past Medical History:  Diagnosis Date  . Balance disorder 2008.     Falls a lot.  She can be standing and then leans too far over to one side   . Bulging disc   . Chronic bronchitis ([place])    due to congestion at times, on prednisone and advair  . COPD (chronic obstructive pulmonary disease) ([place])   . Depression    many years ago  . GERD (gastroesophageal reflux disease)   . Hearing loss    mild  . Hyperlipidemia   . Hypertension   . Iatrogenic adrenal insufficiency ([place])   . Incontinence    not indicated at this visit.  Marland Kitchen LVH (left ventricular hypertrophy) due to hypertensive disease    a. 02/2017: echo showing an EF of 65-70% with moderate LVH, hyperdynamic LV with subvalvular gradient of 70 mmHg, SAM, and mild MR  . Neuropathy    legs stay numb   . Osteoarthritis    hands,   . Osteoporosis   . Shortness of breath dyspnea   . Tubulovillous adenoma of rectum   . Wears dentures   . Wears glasses    Immunization History  Administered Date(s) Administered  . Influenza, High Dose Seasonal PF 08/23/2014, 07/24/2015, 08/13/2016  . Pneumococcal Conjugate-13 08/23/2014  . Pneumococcal-Unspecified 07/20/2003, 08/17/2013  . Td 08/17/2013  . Tdap 10/25/2012  . Zoster 03/19/2015   Past Surgical History:  Procedure Laterality Date  . ABDOMINAL HYSTERECTOMY    . APPENDECTOMY    . CHONDROPLASTY  09/19/2014   Procedure: CHONDROPLASTY;  Surgeon: Alta Corning, MD;  Location: [facility];  Service: Orthopedics;;  . DILATION AND CURETTAGE OF UTERUS    . EXTERNAL FIXATION LEG  10/25/2012   Procedure: EXTERNAL FIXATION LEG;  Surgeon: Rozanna Box, MD;  Location: [facility];  Service: Orthopedics;  Laterality: Right;  . EYE SURGERY     bilateral cataract  surgery and lens implant  . FINGER SURGERY     fusions and debridements for OA  . INCONTINENCE SURGERY     multiple procedures, not cured  . KNEE ARTHROSCOPY WITH LATERAL MENISECTOMY Right 09/19/2014   Procedure: KNEE ARTHROSCOPY WITH LATERAL MENISECTOMY;  Surgeon: Alta Corning, MD;  Location: [facility];  Service: Orthopedics;  Laterality: Right;  . KNEE ARTHROSCOPY WITH MEDIAL MENISECTOMY Right 09/19/2014   Procedure: RIGHT KNEE ARTHROSCOPY WITH MEDIAL AND LATERAL MENISECTOMIES. CHONDROPLASTY OF PATELLA-FEMORAL JOINT;  Surgeon: Alta Corning, MD;  Location: [facility];  Service: Orthopedics;  Laterality: Right;  . RECTAL BIOPSY  09/21/2011   Procedure: BIOPSY  RECTAL;  Surgeon: Merrie Roof, MD;  Location: [facility];  Service: General;  Laterality: N/A;  3-4 cm  . RECTAL SURGERY     by dr. Marlou Starks, removal of polyp  . TONSILLECTOMY     FHx:    Reviewed / unchanged  SHx:    Reviewed / unchanged  Systems Review:  Constitutional: Denies fever, chills, wt changes, headaches, insomnia, fatigue, night sweats, change in appetite. Eyes: Denies redness, blurred vision, diplopia, discharge, itchy, watery eyes.  ENT: Denies discharge, congestion, post nasal drip, epistaxis, sore throat, earache, hearing loss, dental pain, tinnitus, vertigo, sinus pain, snoring.  CV: Denies chest pain, palpitations, irregular heartbeat, syncope, dyspnea, diaphoresis, orthopnea, PND, claudication or edema. Respiratory: denies cough, dyspnea, DOE, pleurisy, hoarseness, laryngitis, wheezing.  Gastrointestinal: Denies dysphagia, odynophagia, heartburn, reflux, water brash, abdominal pain or cramps, nausea, vomiting, bloating, diarrhea, constipation, hematemesis, melena, hematochezia  or hemorrhoids. Genitourinary: Denies dysuria, frequency, urgency, nocturia, hesitancy, discharge, hematuria or flank pain. Musculoskeletal: Denies arthralgias, myalgias, stiffness, jt. swelling, pain, limping or  strain/sprain.  Skin: Denies pruritus, rash, hives, warts, acne, eczema or change in skin lesion(s). Neuro: No weakness, tremor, incoordination, spasms, paresthesia or pain. Psychiatric: Denies confusion, memory loss or sensory loss. Endo: Denies change in weight, skin or hair change.  Heme/Lymph: No excessive bleeding, bruising or enlarged lymph nodes.  Physical Exam  BP (!) 110/50   Pulse 69   Temp (!) 96.4 F (35.8 C)   Resp 16   Ht 5\' 4"  (1.626 m)   Wt 146 lb 6.4 oz (66.4 kg)   BMI 25.13 kg/m   O2 sat 96 % on Room Air  Appears chronically ill, w/o cyanosis clubbing, icterus or rash and in no distress.  Eyes: PERRLA, EOMs, conjunctiva no swelling or erythema. Sinuses: No frontal/maxillary tenderness ENT/Mouth: EAC's clear, TM's nl w/o erythema, bulging. Nares clear w/o erythema, swelling, exudates. Oropharynx clear without erythema or exudates. Oral hygiene is good. Tongue normal, non obstructing. Hearing intact.  Neck: Supple. Thyroid nl. Car 2+/2+ without bruits, nodes or JVD. Chest: Respirations nl with BS clear, distant & equal w/o rales, rhonchi, wheezing or stridor.  Cor: Heart sounds soft w/ regular rate and rhythm w/ gr 3/4 sys Ao murmur radiating widely. Peripheral pulses normal and equal  without edema.  Abdomen: Soft & bowel sounds normal. Non-tender w/o guarding, rebound, hernias, masses or organomegaly.  Lymphatics: Unremarkable.  Musculoskeletal: Full ROM all peripheral extremities, joint stability, 5/5 strength and broad based unstable gait.  Skin: Warm, dry without exposed rashes, lesions or ecchymosis apparent. Neuro: Cranial nerves intact, reflexes equal bilaterally. Sensory-motor testing grossly intact. Tendon reflexes grossly intact. Pill rolling tremor and sl cogwheeling of the UE's.  Pysch: Alert & oriented x 3.  Insight and judgement nl & appropriate.   Assessment and Plan:  1. Acute respiratory failure with hypoxemia (HCC)   2. Restrictive lung  disease   3. Acute kidney injury ([place])  - Continue monitor periodic renal functions  - BASIC METABOLIC PANEL WITH GFR  4. Demand ischemia ([place])   5. 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 Gastroesophageal reflux disease without esophagitis   7. Essential hypertension  - Continue medication, monitor blood pressure at home.  - Continue DASH diet. Reminder to go to the ER if any CP,  SOB, nausea, dizziness, severe HA, changes vision/speech,  left arm numbness and tingling and jaw pain.  - BASIC METABOLIC PANEL WITH GFR - CBC with Differential/Platelet  8. Hypokalemia  - BASIC METABOLIC PANEL  WITH GFR  9. Medication management  - BASIC METABOLIC PANEL WITH GFR - CBC with Differential/Platelet       Discussed  regular exercise, BP monitoring, weight control to achieve/maintain BMI less than 25 and discussed med and SE's. Recommended labs to assess and monitor clinical status with further disposition pending results of labs. Over 30 minutes of exam, counseling, chart review was performed.

## 2017-04-01 DIAGNOSIS — I10 Essential (primary) hypertension: Secondary | ICD-10-CM | POA: Diagnosis not present

## 2017-04-01 DIAGNOSIS — G629 Polyneuropathy, unspecified: Secondary | ICD-10-CM | POA: Diagnosis not present

## 2017-04-01 DIAGNOSIS — J441 Chronic obstructive pulmonary disease with (acute) exacerbation: Secondary | ICD-10-CM | POA: Diagnosis not present

## 2017-04-01 DIAGNOSIS — I248 Other forms of acute ischemic heart disease: Secondary | ICD-10-CM | POA: Diagnosis not present

## 2017-04-01 DIAGNOSIS — E43 Unspecified severe protein-calorie malnutrition: Secondary | ICD-10-CM | POA: Diagnosis not present

## 2017-04-01 DIAGNOSIS — G903 Multi-system degeneration of the autonomic nervous system: Secondary | ICD-10-CM | POA: Diagnosis not present

## 2017-04-07 ENCOUNTER — Ambulatory Visit: Payer: Medicare Other | Admitting: Neurology

## 2017-04-10 ENCOUNTER — Emergency Department (HOSPITAL_COMMUNITY): Payer: Medicare Other

## 2017-04-10 ENCOUNTER — Encounter (HOSPITAL_COMMUNITY): Payer: Self-pay | Admitting: Emergency Medicine

## 2017-04-10 ENCOUNTER — Emergency Department (HOSPITAL_COMMUNITY)
Admission: EM | Admit: 2017-04-10 | Discharge: 2017-04-10 | Disposition: A | Discharge status: Home / Self Care | Payer: Medicare Other | Attending: Emergency Medicine | Admitting: Emergency Medicine

## 2017-04-10 DIAGNOSIS — Z79899 Other long term (current) drug therapy: Secondary | ICD-10-CM | POA: Insufficient documentation

## 2017-04-10 DIAGNOSIS — I10 Essential (primary) hypertension: Secondary | ICD-10-CM | POA: Insufficient documentation

## 2017-04-10 DIAGNOSIS — R069 Unspecified abnormalities of breathing: Secondary | ICD-10-CM | POA: Diagnosis not present

## 2017-04-10 DIAGNOSIS — J441 Chronic obstructive pulmonary disease with (acute) exacerbation: Secondary | ICD-10-CM | POA: Diagnosis not present

## 2017-04-10 DIAGNOSIS — R0602 Shortness of breath: Secondary | ICD-10-CM

## 2017-04-10 DIAGNOSIS — Z7982 Long term (current) use of aspirin: Secondary | ICD-10-CM | POA: Insufficient documentation

## 2017-04-10 DIAGNOSIS — R062 Wheezing: Secondary | ICD-10-CM | POA: Diagnosis not present

## 2017-04-10 DIAGNOSIS — G20 Parkinson's disease: Secondary | ICD-10-CM | POA: Insufficient documentation

## 2017-04-10 LAB — CBC
HCT: 47.5 % — ABNORMAL HIGH (ref 36.0–46.0)
Hemoglobin: 15.6 g/dL — ABNORMAL HIGH (ref 12.0–15.0)
MCH: 30.5 pg (ref 26.0–34.0)
MCHC: 32.8 g/dL (ref 30.0–36.0)
MCV: 93 fL (ref 78.0–100.0)
PLATELETS: 172 10*3/uL (ref 150–400)
RBC: 5.11 MIL/uL (ref 3.87–5.11)
RDW: 14.3 % (ref 11.5–15.5)
WBC: 6.3 10*3/uL (ref 4.0–10.5)

## 2017-04-10 LAB — BASIC METABOLIC PANEL
ANION GAP: 11 (ref 5–15)
BUN: 18 mg/dL (ref 6–20)
CALCIUM: 9.1 mg/dL (ref 8.9–10.3)
CO2: 29 mmol/L (ref 22–32)
Chloride: 103 mmol/L (ref 101–111)
Creatinine, Ser: 1.05 mg/dL — ABNORMAL HIGH (ref 0.44–1.00)
GFR calc Af Amer: 57 mL/min — ABNORMAL LOW (ref >60)
GFR, EST NON AFRICAN AMERICAN: 50 mL/min — AB (ref >60)
Glucose, Bld: 135 mg/dL — ABNORMAL HIGH (ref 65–99)
Potassium: 3.2 mmol/L — ABNORMAL LOW (ref 3.5–5.1)
Sodium: 143 mmol/L (ref 135–145)

## 2017-04-10 MED ORDER — POTASSIUM CHLORIDE CRYS ER 20 MEQ PO TBCR
40.0000 meq | EXTENDED_RELEASE_TABLET | Freq: Once | ORAL | Status: AC
  Administered 2017-04-10: 40 meq via ORAL
  Filled 2017-04-10: qty 2

## 2017-04-10 MED ORDER — PREDNISONE 20 MG PO TABS
ORAL_TABLET | ORAL | 0 refills | Status: DC
Start: 2017-04-10 — End: 2017-04-12

## 2017-04-10 MED ORDER — ALBUTEROL (5 MG/ML) CONTINUOUS INHALATION SOLN
10.0000 mg/h | INHALATION_SOLUTION | Freq: Once | RESPIRATORY_TRACT | Status: AC
  Administered 2017-04-10: 10 mg/h via RESPIRATORY_TRACT
  Filled 2017-04-10: qty 20

## 2017-04-10 NOTE — ED Notes (Signed)
RT notified of pending neb tx

## 2017-04-10 NOTE — ED Notes (Signed)
Oxygen 88% on RA at this time, pt placed on 2L. Sats increased to 93%.

## 2017-04-10 NOTE — ED Provider Notes (Signed)
[facility] DEPT Provider Note   CSN: [id number] Arrival date & time: 04/10/17  1127     History   Chief Complaint Chief Complaint  Patient presents with  . Shortness of Breath    HPI Marie [name] is a 81 y.o. female.Complains of shortness of breath with cough productive of yellow sputum for 2 days. Nothing makes symptoms better or worse. Feels like COPD she's had in the past. Brought by EMS. Received nebulized treatments in the field by EMS , supplemental oxygenand also Solu-Medrol 125 mg IV. She reports some improvement of breathing since treatment by EMS. Denies fever. Denies chest pain Denies other associated symptoms  HPI  Past Medical History:  Diagnosis Date  . Balance disorder 2008.     Falls a lot.  She can be standing and then leans too far over to one side   . Bulging disc   . Chronic bronchitis ([place])    due to congestion at times, on prednisone and advair  . COPD (chronic obstructive pulmonary disease) ([place])   . Depression    many years ago  . GERD (gastroesophageal reflux disease)   . Hearing loss    mild  . Hyperlipidemia   . Hypertension   . Iatrogenic adrenal insufficiency ([place])   . Incontinence    not indicated at this visit.  Marland Kitchen LVH (left ventricular hypertrophy) due to hypertensive disease    a. 02/2017: echo showing an EF of 65-70% with moderate LVH, hyperdynamic LV with subvalvular gradient of 70 mmHg, [place], and mild MR  . Neuropathy    legs stay numb   . Osteoarthritis    hands,   . Osteoporosis   . Shortness of breath dyspnea   . Tubulovillous adenoma of rectum   . Wears dentures   . Wears glasses     Patient Active Problem List   Diagnosis Date Noted  . Hypertensive heart disease 03/29/2017  . Protein-calorie malnutrition, severe 03/17/2017  . AKI (acute kidney injury) ([place]) 03/17/2017  . Demand ischemia ([place]) 03/17/2017  . COPD (chronic obstructive pulmonary disease) with acute bronchitis ([place]) 03/16/2017  . Acute respiratory  failure ([place]) 03/09/2017  . 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 Restrictive lung disease 02/24/2017  . Chest congestion 02/24/2017  . Dysphagia 01/12/2017  . Murmur 01/07/2017  . LVH (left ventricular hypertrophy) 01/07/2017  . Acute respiratory failure with hypoxia ([place]) 12/28/2016  . Lobar pneumonia ([place]) 12/28/2016  . Nonspecific abnormal electrocardiogram (ECG) (EKG) 12/28/2016  . Hypokalemia 12/28/2016  . Essential hypertension 12/28/2016  . Depression, major, in remission ([place]) 09/22/2016  . At high risk for falls 10/16/2015  . Hypotension   . Asthma 11/12/2014  . Prediabetes 08/23/2014  . Vitamin D deficiency 08/23/2014  . Medication management 08/23/2014  . Unstable Gait 01/30/2014  . Incontinence   . Osteoarthritis   . Hyperlipidemia   . GERD (gastroesophageal reflux disease)   . Hereditary and idiopathic peripheral neuropathy   . Iatrogenic adrenal insufficiency ([place])   . Tubulovillous adenoma of rectum 09/04/2011    Past Surgical History:  Procedure Laterality Date  . ABDOMINAL HYSTERECTOMY    . APPENDECTOMY    . CHONDROPLASTY  09/19/2014   Procedure: CHONDROPLASTY;  Surgeon: Alta Corning, MD;  Location: [facility];  Service: Orthopedics;;  . DILATION AND CURETTAGE OF UTERUS    . EXTERNAL FIXATION LEG  10/25/2012   Procedure: EXTERNAL FIXATION LEG;  Surgeon: Rozanna Box, MD;  Location: [facility];  Service: Orthopedics;  Laterality: Right;  . EYE SURGERY     bilateral cataract surgery and lens implant  . FINGER SURGERY     fusions and debridements for OA  . INCONTINENCE SURGERY     multiple procedures, not cured  . KNEE ARTHROSCOPY WITH LATERAL MENISECTOMY Right 09/19/2014   Procedure: KNEE ARTHROSCOPY WITH LATERAL MENISECTOMY;  Surgeon: Alta Corning, MD;  Location: [facility];  Service: Orthopedics;  Laterality: Right;  . KNEE ARTHROSCOPY WITH MEDIAL MENISECTOMY Right 09/19/2014   Procedure: RIGHT KNEE ARTHROSCOPY WITH  MEDIAL AND LATERAL MENISECTOMIES. CHONDROPLASTY OF PATELLA-FEMORAL JOINT;  Surgeon: Alta Corning, MD;  Location: [facility];  Service: Orthopedics;  Laterality: Right;  . RECTAL BIOPSY  09/21/2011   Procedure: BIOPSY RECTAL;  Surgeon: Merrie Roof, MD;  Location: [facility];  Service: General;  Laterality: N/A;  3-4 cm  . RECTAL SURGERY     by dr. Marlou Starks, removal of polyp  . TONSILLECTOMY      OB History    No data available       Home Medications    Prior to Admission medications   Medication Sig Start Date End Date Taking? Authorizing Provider  ADVAIR DISKUS 250-50 MCG/DOSE AEPB INHALE ONE PUFF BY MOUTH TWICE DAILY 10/08/16  Yes [name], MD  albuterol (PROVENTIL) (2.5 MG/3ML) 0.083% nebulizer solution USE ONE VIAL IN NEBULIZER EVERY 6 HOURS AS NEEDED FOR WHEEZING OR SHORTNESS OF BREATH 11/12/16  Yes Vicie Mutters, PA-C  aspirin 81 MG tablet Take 81 mg by mouth daily. Buys OTC   Yes [provider]  benzonatate (TESSALON) 200 MG capsule Take 1 capsule (200 mg total) by mouth 3 (three) times daily as needed for cough. 01/19/17  Yes Forcucci, Courtney, PA-C  carbidopa-levodopa (SINEMET IR) 25-100 MG tablet Take 1 tablet by mouth 3 (three) times daily. 03/04/17  Yes Patel, Donika K, DO  Cholecalciferol (VITAMIN D) 2000 units CAPS Take 2,000 Units by mouth daily.   Yes [provider]  diltiazem (CARDIZEM) 30 MG tablet Take 1 tablet (30 mg total) by mouth 2 (two) times daily. 03/29/17  Yes Strader, [place] M, PA-C  famotidine (PEPCID) 20 MG tablet Take 1 tablet (20 mg total) by mouth 2 (two) times daily. 03/19/17  Yes Kathie Dike, MD  furosemide (LASIX) 40 MG tablet Take 0.5 tablets (20 mg total) by mouth daily as needed for fluid. 03/19/17  Yes Kathie Dike, MD  nystatin (MYCOSTATIN) 100000 UNIT/ML suspension Take 5 mLs (500,000 Units total) by mouth 4 (four) times daily. Patient taking differently: Take 5 mLs by mouth 2 (two) times daily.  12/30/16   Yes Rai, Ripudeep K, MD  omeprazole (PRILOSEC) 40 MG capsule Take 1 capsule (40 mg total) by mouth 2 (two) times daily. 12/30/16  Yes Rai, Ripudeep K, MD  potassium chloride (K-DUR) 10 MEQ tablet Take 1 tablet (10 mEq total) by mouth daily. 03/10/17 04/10/17 Yes Emokpae, Courage, MD  pravastatin (PRAVACHOL) 40 MG tablet Take 20 mg by mouth daily.   Yes [provider]  sodium chloride HYPERTONIC 3 % nebulizer solution Take 1 vial by nebulization BID 02/24/17  Yes Juanito Doom, MD  traZODone (DESYREL) 50 MG tablet Take 25 mg by mouth at bedtime.   Yes [provider]    Family History Family History  Problem Relation Age of Onset  . Cancer Sister        pt unaware of what kind  . High blood pressure Mother   .  COPD Mother   . Heart attack Father     Social History Social History  Substance Use Topics  . Smoking status: Never Smoker  . Smokeless tobacco: Never Used  . Alcohol use No     Allergies   Ertapenem; Codeine; Demerol; Morphine and related; Other; and Sulfate   Review of Systems Review of Systems  Constitutional: Negative.   HENT: Negative.   Respiratory: Positive for cough and shortness of breath.   Cardiovascular: Negative.   Gastrointestinal: Negative.   Musculoskeletal: Negative.   Skin: Negative.   Neurological: Negative.   Psychiatric/Behavioral: Negative.   All other systems reviewed and are negative.    Physical Exam Updated Vital Signs BP (!) 142/68 (BP Location: Left Arm)   Pulse (!) 102   Temp 97.7 F (36.5 C) (Oral)   Resp (!) 25   Ht 5\' 4"  (1.626 m)   Wt 66.2 kg (146 lb)   SpO2 98%   BMI 25.06 kg/m   Physical Exam  Constitutional:  Chronically ill-appearing  HENT:  Head: Normocephalic and atraumatic.  Eyes: Conjunctivae are normal. Pupils are equal, round, and reactive to light.  Neck: Neck supple. No tracheal deviation present. No thyromegaly present.  Cardiovascular: Regular rhythm.   No murmur heard. Mildly  tachycardic  Pulmonary/Chest:  Expiratory wheezes. Coughing frequently  Abdominal: Soft. Bowel sounds are normal. She exhibits no distension. There is no tenderness.  Musculoskeletal: Normal range of motion. She exhibits no edema or tenderness.  Neurological: She is alert. Coordination normal.  Skin: Skin is warm and dry. No rash noted.  Psychiatric: She has a normal mood and affect.  Nursing note and vitals reviewed.    ED Treatments / Results  Labs (all labs ordered are listed, but only abnormal results are displayed) Labs Reviewed  BASIC METABOLIC PANEL  CBC    EKG  EKG Interpretation  Date/Time:  Saturday April 10 2017 11:35:10 EDT Ventricular Rate:  103 PR Interval:    QRS Duration: 98 QT Interval:  378 QTC Calculation: 493 R Axis:   -36 Text Interpretation:  Sinus tachycardia Inferior infarct, old Anterior infarct, old Artifact in lead(s) I II III aVR aVL aVF V1 V2 V5 Interpretation limited secondary to artifact No significant change since last tracing Confirmed by Fredia Sorrow [phone]) on 04/10/2017 11:52:03 AM     ED ECG REPORT   Date: 04/10/2017  Rate: 103  Rhythm: sinus tachycardia  QRS Axis: normal  Intervals: QT prolonged  ST/T Wave abnormalities: nonspecific T wave changes  Conduction Disutrbances:none  Narrative Interpretation:   Old EKG Reviewed: Prior tracing of poor quality, no significant change likely  I have personally reviewed the EKG tracing and agree with the computerized printout as noted.  Radiology No results found.  Procedures Procedures (including critical care time)  Medications Ordered in ED Medications  albuterol (PROVENTIL,VENTOLIN) solution continuous neb (not administered)    Chest x-ray viewed by me Results for orders placed or performed during the hospital encounter of 69/45/03  Basic metabolic panel  Result Value Ref Range   Sodium 143 135 - 145 mmol/L   Potassium 3.2 (L) 3.5 - 5.1 mmol/L   Chloride 103 101 - 111  mmol/L   CO2 29 22 - 32 mmol/L   Glucose, Bld 135 (H) 65 - 99 mg/dL   BUN 18 6 - 20 mg/dL   Creatinine, Ser 1.05 (H) 0.44 - 1.00 mg/dL   Calcium 9.1 8.9 - 10.3 mg/dL   GFR calc non Af Amer 50 (L) >  60 mL/min   GFR calc Af Amer 57 (L) >60 mL/min   Anion gap 11 5 - 15  CBC  Result Value Ref Range   WBC 6.3 4.0 - 10.5 K/uL   RBC 5.11 3.87 - 5.11 MIL/uL   Hemoglobin 15.6 (H) 12.0 - 15.0 g/dL   HCT 47.5 (H) 36.0 - 46.0 %   MCV 93.0 78.0 - 100.0 fL   MCH 30.5 26.0 - 34.0 pg   MCHC 32.8 30.0 - 36.0 g/dL   RDW 14.3 11.5 - 15.5 %   Platelets 172 150 - 400 K/uL   Nm Pulmonary Perf And Vent  Result Date: 03/16/2017 CLINICAL DATA:  Acute onset of shortness of breath and cough. Initial encounter. EXAM: NUCLEAR MEDICINE VENTILATION - PERFUSION LUNG SCAN TECHNIQUE: Ventilation images were obtained in multiple projections using inhaled aerosol Tc-76m DTPA. Perfusion images were obtained in multiple projections after intravenous injection of Tc-64m MAA. RADIOPHARMACEUTICALS:  30 mCi Technetium-44m DTPA aerosol inhalation and 4.3 mCi Technetium-19m MAA IV COMPARISON:  Chest radiograph performed earlier today at 7:44 a.m. FINDINGS: Ventilation: There are multiple large ventilation defects throughout both lungs, suspicious for underlying emphysema. There is suggestion of artifactual accumulation of activity along the tracheobronchial tree. Perfusion: No significant wedge shaped peripheral perfusion defects to suggest acute pulmonary embolism. Perfusion within both lungs is somewhat heterogeneous in appearance, with small subsegmental filling defects noted bilaterally. IMPRESSION: Low probability for pulmonary embolus. Significantly suboptimal ventilation images raises question for underlying emphysema. Electronically Signed   By: Garald Balding M.D.   On: 03/16/2017 23:44   US Venous Img Lower Bilateral  Result Date: 03/17/2017 CLINICAL DATA:  Hypoxia EXAM: BILATERAL LOWER EXTREMITY VENOUS DUPLEX ULTRASOUND  TECHNIQUE: Gray-scale sonography with graded compression, as well as color Doppler and duplex ultrasound were performed to evaluate the lower extremity deep venous systems from the level of the common femoral vein and including the common femoral, femoral, profunda femoral, popliteal and calf veins including the posterior tibial, peroneal and gastrocnemius veins when visible. The superficial great saphenous vein was also interrogated. Spectral Doppler was utilized to evaluate flow at rest and with distal augmentation maneuvers in the common femoral, femoral and popliteal veins. COMPARISON:  None. FINDINGS: RIGHT LOWER EXTREMITY Common Femoral Vein: No evidence of thrombus. Normal compressibility, respiratory phasicity and response to augmentation. Saphenofemoral Junction: No evidence of thrombus. Normal compressibility and flow on color Doppler imaging. Profunda Femoral Vein: No evidence of thrombus. Normal compressibility and flow on color Doppler imaging. Femoral Vein: No evidence of thrombus. Normal compressibility, respiratory phasicity and response to augmentation. Popliteal Vein: No evidence of thrombus. Normal compressibility, respiratory phasicity and response to augmentation. Calf Veins: No evidence of thrombus. Normal compressibility and flow on color Doppler imaging. Superficial Great Saphenous Vein: No evidence of thrombus. Normal compressibility and flow on color Doppler imaging. Venous Reflux:  None. Other Findings:  None. LEFT LOWER EXTREMITY Common Femoral Vein: No evidence of thrombus. Normal compressibility, respiratory phasicity and response to augmentation. Saphenofemoral Junction: No evidence of thrombus. Normal compressibility and flow on color Doppler imaging. Profunda Femoral Vein: No evidence of thrombus. Normal compressibility and flow on color Doppler imaging. Femoral Vein: No evidence of thrombus. Normal compressibility, respiratory phasicity and response to augmentation. Popliteal Vein:  No evidence of thrombus. Normal compressibility, respiratory phasicity and response to augmentation. Calf Veins: No evidence of thrombus. Normal compressibility and flow on color Doppler imaging. Superficial Great Saphenous Vein: No evidence of thrombus. Normal compressibility and flow on color Doppler imaging. Venous Reflux:  None. Other Findings:  None. IMPRESSION: No evidence of deep venous thrombosis within either lower extremity. Electronically Signed   By: Lowella Grip III M.D.   On: 03/17/2017 09:07   Dg Chest Port 1 View  Result Date: 04/10/2017 CLINICAL DATA:  81 year old female with a history of shortness of breath EXAM: PORTABLE CHEST 1 VIEW COMPARISON:  03/16/2017 FINDINGS: Cardiomediastinal silhouette unchanged. Calcifications of the aortic arch. No evidence of central vascular congestion. Low lung volumes with interstitial prominence. No interlobular septal thickening. No confluent airspace disease. No large pleural effusion or pneumothorax. IMPRESSION: Low lung volumes with chronic lung changes and no evidence of superimposed acute cardiopulmonary disease Electronically Signed   By: Corrie Mckusick D.O.   On: 04/10/2017 13:36   Dg Chest Portable 1 View  Result Date: 03/16/2017 CLINICAL DATA:  Short of breath EXAM: PORTABLE CHEST 1 VIEW COMPARISON:  03/09/2017 FINDINGS: Normal heart size. Lungs clear. No pneumothorax. No pleural effusion. IMPRESSION: No active disease. Electronically Signed   By: Marybelle Killings M.D.   On: 03/16/2017 08:00   Initial Impression / Assessment and Plan / ED Course  I have reviewed the triage vital signs and the nursing notes.  Pertinent labs & imaging results that were available during my care of the patient were reviewed by me and considered in my medical decision making (see chart for details).     4 PM patient was able to walk around the emergency department without dyspnea after treatment with continuous nebulization albuterol for 1 hour. She did not  require oxygen. She states her breathing is normal for her  and at baseline. On reexamination lungs clear auscultation she is mildly tachypnea and tachycardic likely secondary to continuous nebulization. She received potassium chloride 40 mEq by mouth prior to discharge. Plan she is instructed to use her albuterol nebulizer every 4 hours as needed. Prescription prednisone Return if needed more than every 4 hours or see Dr.Mckeown Final Clinical Impressions(s) / ED Diagnoses  Diagnosis #1 exacerbation of COPD #2 hypokalemia Final diagnoses:  None    New Prescriptions New Prescriptions   No medications on file     Orlie Dakin, MD 04/10/17 [phone]

## 2017-04-10 NOTE — ED Notes (Signed)
Pt given Sprite to drink with EDP approval.

## 2017-04-10 NOTE — ED Triage Notes (Signed)
Pt from c/o SOB that began yesterday. Per EMS, pt sats 88% on RA. Pt received 2 neb tx PTA and 125mg  Solumedrol at 1115. Pt AOx4.

## 2017-04-10 NOTE — ED Notes (Signed)
EDP at bedside  

## 2017-04-10 NOTE — Discharge Instructions (Signed)
Use your albuterol nebulizer every 4 hours as needed for shortness of breath. Return if needed more than every 4 hours or see Dr.Mckeown. Return if your condition worsens for any reason

## 2017-04-10 NOTE — ED Notes (Signed)
Pt's husband arrived and is at bedside.

## 2017-04-12 ENCOUNTER — Encounter: Payer: Self-pay | Admitting: Internal Medicine

## 2017-04-12 ENCOUNTER — Ambulatory Visit (INDEPENDENT_AMBULATORY_CARE_PROVIDER_SITE_OTHER): Payer: Medicare Other | Admitting: Internal Medicine

## 2017-04-12 ENCOUNTER — Other Ambulatory Visit: Payer: Self-pay | Admitting: Internal Medicine

## 2017-04-12 VITALS — BP 120/64 | HR 86 | Temp 96.8°F | Resp 16 | Ht 64.0 in | Wt 138.4 lb

## 2017-04-12 DIAGNOSIS — I248 Other forms of acute ischemic heart disease: Secondary | ICD-10-CM

## 2017-04-12 DIAGNOSIS — J9601 Acute respiratory failure with hypoxia: Secondary | ICD-10-CM | POA: Diagnosis not present

## 2017-04-12 DIAGNOSIS — G232 Striatonigral degeneration: Secondary | ICD-10-CM

## 2017-04-12 DIAGNOSIS — Z1231 Encounter for screening mammogram for malignant neoplasm of breast: Secondary | ICD-10-CM

## 2017-04-12 DIAGNOSIS — I10 Essential (primary) hypertension: Secondary | ICD-10-CM | POA: Diagnosis not present

## 2017-04-12 DIAGNOSIS — J984 Other disorders of lung: Secondary | ICD-10-CM | POA: Diagnosis not present

## 2017-04-12 DIAGNOSIS — G20C Parkinsonism, unspecified: Secondary | ICD-10-CM

## 2017-04-12 MED ORDER — IPRATROPIUM-ALBUTEROL 0.5-2.5 (3) MG/3ML IN SOLN: RESPIRATORY_TRACT | 5 refills | Status: DC

## 2017-04-12 MED ORDER — PREDNISONE 10 MG PO TABS: ORAL_TABLET | ORAL | 3 refills | Status: DC

## 2017-04-12 NOTE — Progress Notes (Signed)
[place]     This very nice 81 y.o. MWF was admitted  5/29-03/19/2017 for Restrictive Lung disease with AECB and hypoxic respiratory insufficiency. CXR and VQ lung scans were negative as was 2D EC. Patient was treated with inhalent bronchodilator, Abx's and hi dose steroids and improved and was d/c'd on a Prednisone pulse/taper.  Then she re-presented to the ER on 04/10/17 by EMS in respiratory distress and was treated with inhalent albuterol and was stabilized, improved and was discharged at home. Apparently she has only been using her neb albuterol 1-2 mx/day at home. She reports occasional yellowish sputum to a "thick" clear expectorant.           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          Patient is also followed with Hypertension, Hyperlipidemia, Pre-Diabetes and Vitamin D Deficiency.      Patient is treated for HTN & BP has been controlled at home. Today's BP is at goal -  120/64. Patient has had no complaints of any cardiac type chest pain, palpitations, dyspnea/orthopnea/PND, dizziness, claudication, or dependent edema.     Hyperlipidemia is controlled with diet & meds. Patient denies myalgias or other med SE's. Last Lipids were at goal: Lab Results  Component Value Date   CHOL 158 02/17/2017   HDL 69 02/17/2017   LDLCALC 72 02/17/2017   TRIG 84 02/17/2017   CHOLHDL 2.3 02/17/2017      Also, the patient has history of PreDiabetes and has had no symptoms of reactive hypoglycemia, diabetic polys, paresthesias or visual blurring.  Last A1c was at goal: Lab Results  Component Value Date   HGBA1C 5.5 02/17/2017      Further, the patient also has history of Vitamin D Deficiency and supplements vitamin D without any suspected side-effects. Last vitamin D was low: Lab Results  Component Value Date   VD25OH 45  02/17/2017   Current Outpatient Prescriptions on File Prior to Visit  Medication Sig  . ADVAIR DISKUS 250-50 MCG/DOSE AEPB INHALE ONE PUFF BY MOUTH TWICE DAILY  . albuterol (PROVENTIL) (2.5 MG/3ML) 0.083% nebulizer solution USE ONE VIAL IN NEBULIZER EVERY 6 HOURS AS NEEDED FOR WHEEZING OR SHORTNESS OF BREATH  . aspirin 81 MG tablet Take 81 mg by mouth daily. Buys OTC  . benzonatate (TESSALON) 200 MG capsule Take 1 capsule (200 mg total) by mouth 3 (three) times daily as needed for cough.  . carbidopa-levodopa (SINEMET IR) 25-100 MG tablet Take 1 tablet by mouth 3 (three) times daily.  . Cholecalciferol (VITAMIN D) 2000 units CAPS Take 2,000 Units by mouth daily.  Marland Kitchen diltiazem (CARDIZEM) 30 MG tablet Take 1 tablet (30 mg total) by mouth 2 (two) times daily.  . famotidine (PEPCID) 20 MG tablet Take 1 tablet (20 mg total) by mouth 2 (two) times daily.  . furosemide (LASIX) 40 MG tablet Take 0.5 tablets (20 mg total) by mouth daily as needed for fluid.  Marland Kitchen nystatin (MYCOSTATIN) 100000 UNIT/ML suspension Take 5 mLs (500,000 Units total) by mouth 4 (four) times daily. (Patient taking differently: Take 5 mLs by mouth 2 (two) times daily. )  . omeprazole (PRILOSEC) 40 MG capsule Take 1 capsule (40 mg total) by mouth 2 (two) times daily.  . pravastatin (PRAVACHOL) 40 MG tablet Take 20 mg by mouth daily.  . predniSONE (DELTASONE) 20 MG  tablet 2 tabs po daily x 4 days  . sodium chloride HYPERTONIC 3 % nebulizer solution Take 1 vial by nebulization BID  . traZODone (DESYREL) 50 MG tablet Take 25 mg by mouth at bedtime.  . potassium chloride (K-DUR) 10 MEQ tablet Take 1 tablet (10 mEq total) by mouth daily.   No current facility-administered medications on file prior to visit.    Allergies  Allergen Reactions  . Ertapenem Hives    Caused whole body to turn red Other reaction(s): Redness Caused whole body to turn red  . Codeine Other (See Comments)    hallucinations  . Demerol Other (See Comments)     hallucinations  . Morphine And Related Other (See Comments)    hallucinations  . Other Other (See Comments)    Invan 7- pt became red all over  . Sulfate Other (See Comments)    unknown   PMHx:   Past Medical History:  Diagnosis Date  . Balance disorder 2008.     Falls a lot.  She can be standing and then leans too far over to one side   . Bulging disc   . Chronic bronchitis ([place])    due to congestion at times, on prednisone and advair  . COPD (chronic obstructive pulmonary disease) ([place])   . Depression    many years ago  . GERD (gastroesophageal reflux disease)   . Hearing loss    mild  . Hyperlipidemia   . Hypertension   . Iatrogenic adrenal insufficiency ([place])   . Incontinence    not indicated at this visit.  Marland Kitchen LVH (left ventricular hypertrophy) due to hypertensive disease    a. 02/2017: echo showing an EF of 65-70% with moderate LVH, hyperdynamic LV with subvalvular gradient of 70 mmHg, SAM, and mild MR  . Neuropathy    legs stay numb   . Osteoarthritis    hands,   . Osteoporosis   . Shortness of breath dyspnea   . Tubulovillous adenoma of rectum   . Wears dentures   . Wears glasses    Immunization History  Administered Date(s) Administered  . Influenza, High Dose Seasonal PF 08/23/2014, 07/24/2015, 08/13/2016  . Pneumococcal Conjugate-13 08/23/2014  . Pneumococcal-Unspecified 07/20/2003, 08/17/2013  . Td 08/17/2013  . Tdap 10/25/2012  . Zoster 03/19/2015   Past Surgical History:  Procedure Laterality Date  . ABDOMINAL HYSTERECTOMY    . APPENDECTOMY    . CHONDROPLASTY  09/19/2014   Procedure: CHONDROPLASTY;  Surgeon: Alta Corning, MD;  Location: [facility];  Service: Orthopedics;;  . DILATION AND CURETTAGE OF UTERUS    . EXTERNAL FIXATION LEG  10/25/2012   Procedure: EXTERNAL FIXATION LEG;  Surgeon: Rozanna Box, MD;  Location: [facility];  Service: Orthopedics;  Laterality: Right;  . EYE SURGERY     bilateral cataract surgery and lens implant    . FINGER SURGERY     fusions and debridements for OA  . INCONTINENCE SURGERY     multiple procedures, not cured  . KNEE ARTHROSCOPY WITH LATERAL MENISECTOMY Right 09/19/2014   Procedure: KNEE ARTHROSCOPY WITH LATERAL MENISECTOMY;  Surgeon: Alta Corning, MD;  Location: [facility];  Service: Orthopedics;  Laterality: Right;  . KNEE ARTHROSCOPY WITH MEDIAL MENISECTOMY Right 09/19/2014   Procedure: RIGHT KNEE ARTHROSCOPY WITH MEDIAL AND LATERAL MENISECTOMIES. CHONDROPLASTY OF PATELLA-FEMORAL JOINT;  Surgeon: Alta Corning, MD;  Location: [facility];  Service: Orthopedics;  Laterality: Right;  . RECTAL BIOPSY  09/21/2011  Procedure: BIOPSY RECTAL;  Surgeon: Merrie Roof, MD;  Location: [facility];  Service: General;  Laterality: N/A;  3-4 cm  . RECTAL SURGERY     by dr. Marlou Starks, removal of polyp  . TONSILLECTOMY     FHx:    Reviewed / unchanged  SHx:    Reviewed / unchanged  Systems Review:  Constitutional: Denies fever, chills, wt changes, headaches, insomnia, fatigue, night sweats, change in appetite. Eyes: Denies redness, blurred vision, diplopia, discharge, itchy, watery eyes.  ENT: Denies discharge, congestion, post nasal drip, epistaxis, sore throat, earache, hearing loss, dental pain, tinnitus, vertigo, sinus pain, snoring.  CV: Denies chest pain, palpitations, irregular heartbeat, syncope, dyspnea, diaphoresis, orthopnea, PND, claudication or edema. Respiratory: denies cough, dyspnea, DOE, pleurisy, hoarseness, laryngitis, wheezing.  Gastrointestinal: Denies dysphagia, odynophagia, heartburn, reflux, water brash, abdominal pain or cramps, nausea, vomiting, bloating, diarrhea, constipation, hematemesis, melena, hematochezia  or hemorrhoids. Genitourinary: Denies dysuria, frequency, urgency, nocturia, hesitancy, discharge, hematuria or flank pain. Musculoskeletal: Denies arthralgias, myalgias, stiffness, jt. swelling, pain, limping or strain/sprain.  Skin: Denies  pruritus, rash, hives, warts, acne, eczema or change in skin lesion(s). Neuro: No weakness, tremor, incoordination, spasms, paresthesia or pain. Psychiatric: Denies confusion, memory loss or sensory loss. Endo: Denies change in weight, skin or hair change.  Heme/Lymph: No excessive bleeding, bruising or enlarged lymph nodes.  Physical Exam  BP 120/64   Pulse 86   Temp (!) 96.8 F (36 C)   Resp 16   Ht 5\' 4"  (1.626 m)   Wt 138 lb 6.4 oz (62.8 kg)   SpO2 88% at rest    BMI 23.76 kg/m   Appears well groomed, chronically ill and in no acute respiratory distress.  Eyes: PERRLA, EOMs, conjunctiva no swelling or erythema. Sinuses: No frontal/maxillary tenderness ENT/Mouth: EAC's clear, TM's nl w/o erythema, bulging. Nares clear w/o erythema, swelling, exudates. Oropharynx clear without erythema or exudates. Oral hygiene is good. Tongue normal, non obstructing. Hearing intact.  Neck: Supple. Thyroid nl. Car 2+/2+ without bruits, nodes or JVD. Chest: Respirations with distant BS w/ scattered rales & rhonchi, wheezing  Clearing partially w/cough. stridor.  Cor: Heart sounds normal w/ regular rate and rhythm with Gr 2/4 apical holosystolic blowing murmur. murmurs, gallops, clicks or rubs. Peripheral pulses normal and equal  without edema.  Abdomen: Soft & bowel sounds normal. Benign. Lymphatics: Unremarkable.  Musculoskeletal: Full ROM w/generalized decrease in muscle power, tone & bulk. Unstable broad base shuffling short steppage gait supported w/her walker.  Skin: Warm, dry without exposed rashes, lesions or ecchymosis apparent.  Neuro: Cranial nerves intact, reflexes equal bilaterally. Sensory-motor testing grossly intact. Tendon reflexes grossly intact.  Pysch: Alert & oriented x 3.  Insight and judgement nl & appropriate. No ideations.  Assessment and Plan:   1. Essential hypertension  - Continue medication, monitor blood pressure at home.  - Continue DASH diet. Reminder to go to  the ER if any CP,  SOB, nausea, dizziness, severe HA, changes vision/speech,    2. Restrictive lung disease  - predniSONE (DELTASONE) 10 MG tablet; 1 tablet 3 x/day or as directed  Dispense: 90 tablet; Refill: 3 To resume 1 tablet daily  3. Acute respiratory failure with hypoxemia (H[facility])  - ipratropium-albuterol (DUONEB) 0.5-2.5 (3) MG/3ML SOLN; Inhale 4 x / day or every 4 hours if needed for Shortness of Breath  Dispense: 360 mL; Refill: 5  - For home use only DME oxygen - to start 2 lit/min  4. 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

## 2017-04-12 NOTE — Patient Instructions (Signed)
Chronic Respiratory Failure °Respiratory failure is a condition in which the lungs do not work well and the breathing (respiratory) system fails. When respiratory failure occurs, it becomes difficult for the lungs to get enough oxygen or to eliminate carbon dioxide or to do both duties. If the lungs do not work properly, the heart, brain, and other body systems do not get enough oxygen. Respiratory failure is life-threatening if it is not treated. °Respiratory failure can be acute or chronic. Acute respiratory failure is sudden and severe and requires emergency medical treatment. Chronic respiratory failure happens over time, usually due to a medical condition that gets worse. °What are the causes? °This condition may be caused by any problem that affects the heart or lungs. Causes include: °· Chronic bronchitis and emphysema (COPD). °· Pulmonary fibrosis. °· Water in the lungs due to heart failure, lung injury, or infection (pulmonary edema). °· Asthma. °· Nerve or muscle diseases that make chest movements difficult, such as Lou Gehrig disease or Guillain-Barre syndrome. °· A collapsed lung (pneumothorax). °· Pulmonary hypertension. °· Chronic sleep apnea. °· Pneumonia. °· Obesity. °· A blood clot in a lung (pulmonary embolism). °· Trauma to the chest that makes breathing difficult. °What increases the risk? °You are more likely to develop this condition if: °· You are a smoker, or have a history of smoking. °· You have a weak immune system. °· You have a family history of breathing problems or lung disease. °· You have a long term lung disease such as COPD. °What are the signs or symptoms? °Symptoms of this condition include: °· Shortness of breath with or without activity. °· Difficulty breathing. °· Wheezing. °· A fast or irregular heartbeat (arrhythmia). °· Chest pain or tightness. °· A bluish color to the fingernail or toenail beds (cyanosis). °· Confusion. °· Drowsiness. °· Extreme fatigue, especially with  minimal activity. °How is this diagnosed? °This condition may be diagnosed based on: °· Your medical history. °· A physical exam. °· Other tests, such as: °¨ A chest X-ray. °¨ A CT scan of your lungs. °¨ Blood tests, such as an arterial blood gas test. This test is done to check if you have enough oxygen in your blood. °¨ An electrocardiogram. This test records the electrical activity of your heart. °¨ An echocardiogram. This test uses sound waves to produce an image of your heart. °· A check of your blood pressure, heart rate, breathing rate, and blood oxygen level. °How is this treated? °Treatment for this condition depends on the cause. Treatment can include the following: °· Getting oxygen through a nasal cannula. This is a tube that goes in your nose. °· Getting oxygen through a face mask. °· Receiving noninvasive positive pressure ventilation. This is a method of breathing support in which a machine blows air into your lungs through a mask. The machine allows you to breathe on your own. It helps the body take in oxygen and eliminate carbon dioxide. °· Using a ventilator. This is a breathing machine that delivers oxygen to the lungs through a breathing tube that is put into the trachea. This machine is used when you can no longer breathe well enough on your own. °· Medicines to help with breathing, such as: °¨ Medicines that open up and relax air passages, such as bronchodilators. These may be given through a device that turns liquid medicines into a mist you can breathe in (nebulizer). These medicines help with breathing. °¨ Diuretics. These medicines get rid of extra fluid out of   your lungs, which can help you breathe better. °¨ Steroid medicines. These decrease inflammation in the lungs. °¨ Antibiotic medicines. These may be given to treat a bacterial infection, such as pneumonia. °· Pulmonary rehabilitation. This is an exercise program that strengthens the muscles in your chest and helps you learn breathing  techniques in order to manage your condition. °Follow these instructions at home: °Medicines  °· Take over-the-counter and prescription medicines only as told by your health care provider. °· If you were prescribed an antibiotic medicine, take it as told by your health care provider. Do not stop taking the antibiotic even if you start to feel better. °General instructions  °· Use oxygen therapy and pulmonary rehabilitation if directed to by your health care provider. If you require home oxygen therapy, ask your health care provider whether you should purchase a pulse oximeter to measure your oxygen level at home. °· Work with your health care provider to create a plan to help you deal with your condition. Follow this plan. °· Do not use any products that contain nicotine or tobacco, such as cigarettes and e-cigarettes. If you need help quitting, ask your health care provider. °· Avoid exposure to irritants that make your breathing problems worse. These include smoke, chemicals, and fumes. °· Stay active, but balance activity with periods of rest. Exercise and physical activity will help you maintain your ability to do things you want to do. °· Stay up to date on all vaccines, especially yearly influenza and pneumonia vaccines. °· Avoid people who are sick as well as crowded places during the flu season. °· Keep all follow-up visits as told by your health care provider. This is important. °Contact a health care provider if: °· Your shortness of breath gets worse and you cannot do the things you used to do. °· You have increased mucus (sputum), wheezing, coughing, or loss of energy. °· You are on oxygen therapy and you are starting to need more. °· You need to use your medicines more often. °· You have a fever. °Get help right away if: °· Your shortness of breath becomes worse. °· You are unable to say more than a few words without having to catch your breath. °· You develop chest pain or  tightness. °Summary °· Respiratory failure is a condition in which the lungs do not work well and the breathing system fails. °· This condition can be very serious and is often life-threatening. °· This condition is diagnosed with tests and can be treated with medicines or oxygen. °· Contact a health care provider if your shortness of breath gets worse or if you need to use your oxygen or medicines more often than before. °This information is not intended to replace advice given to you by your health care provider. Make sure you discuss any questions you have with your health care provider. °Document Released: 10/05/2005 Document Revised: 10/16/2016 Document Reviewed: 10/16/2016 °Elsevier Interactive Patient Education © 2017 [employer] ° °

## 2017-04-15 ENCOUNTER — Other Ambulatory Visit: Payer: Self-pay | Admitting: *Deleted

## 2017-04-15 MED ORDER — IPRATROPIUM BROMIDE 0.02 % IN SOLN: RESPIRATORY_TRACT | 1 refills | Status: DC

## 2017-04-15 MED ORDER — ALBUTEROL SULFATE (2.5 MG/3ML) 0.083% IN NEBU: INHALATION_SOLUTION | RESPIRATORY_TRACT | 1 refills | Status: DC

## 2017-04-25 NOTE — Progress Notes (Signed)
  Subjective:    Patient ID: Marie [name], female    DOB: 30-Aug-1936, 81 y.o.   MRN: [id number]  HPI   This very nice unfortunate 81 yo MWF with severe Restrictive Long Disease was recently hospitalized with w/AECB May 29-June 1 and is seen in 2 week f/u of post hospital visit when she was found with hypoxic respiratory insufficiency with a resting O2 of 88%. Patient has had repeated ER visits with exacerbations of air hunger and respiratory insufficiency and treated with bronchodilator nebs til improved & d/c'd. She has since been started on home O2. Last night she awoke short of breath and had "pulled her oxygen off" during th night and her O2 sat was 67%. She is not using her Neb Duo 4 x/ day as advised.      Also patient is followed for HTN, HLD, PreDM & Vit D Def. POhe's also followed by Dr [place] Bells for her Lung Disease and Dr Posey Pronto for Parkinson's (+).    Review of Systems  10 point systems review negative except as above.     Objective:   Physical Exam  BP 116/62   Pulse 88   Temp 97.9 F (36.6 C)   Resp 18   Ht 5\' 4"  (1.626 m)   Wt 142 lb 6.4 oz (64.6 kg)   BMI 24.44 kg/m   Congested wheezy cough. No Stridor. O2 sat 90 % on 2 Lit/min  HEENT - Eac's patent. TM's Nl. EOM's full. PERRLA. NasoOroPharynx w/ buccal thrush.  Neck - supple. Nl Thyroid. Carotids 2+ & No bruits, nodes, JVD Chest - kyphotic.BS decreased  w/ scattered rales, and coarse expiratory rhonchi & wheezes. Cor - Nl HS. RRR w/o sig MGR. PP 1(+). No edema. MS- FROM w/o deformities. Muscle power, tone and bulk Nl. Gait Nl. Neuro - No obvious Cr N abnormalities. Nl w/o focal abnormalities except pill rolling tremor Lt hand.    Assessment & Plan:   1. Essential hypertension  - BASIC METABOLIC PANEL WITH GFR  2. Hypokalemia  - BASIC METABOLIC PANEL WITH GFR  3. Restrictive lung disease - End stage  - encouraged to use her nebulized meds at least 4 x/ day   4. Medication management  - BASIC METABOLIC  PANEL WITH GFR  5. Candidiasis of mouth  - Diflucan tabs

## 2017-04-25 NOTE — Patient Instructions (Signed)
Hypoxemia  Hypoxemia occurs when the blood does not contain enough oxygen. The body cannot work well when it does not have enough oxygen because every part of the body needs oxygen. Oxygen enters the lungs when we breathe in, then it travels to all parts of the body through the blood. Hypoxemia can develop suddenly or slowly.  What are the causes?  Common causes of this condition include:  Long-term (chronic) lung diseases, such as chronic obstructive pulmonary disease (COPD) or interstitial lung disease.  Disorders that affect breathing at night, such as sleep apnea.  Fluid buildup in the lungs (pulmonary edema).  Lung infection (pneumonia).  Lung or throat cancer.  Abnormal blood flow that bypasses the lungs (having a shunt).  Certain diseasesthat affect nerves or muscles.  A collapsed lung (pneumothorax).  A blood clot in the lungs (pulmonary embolus).  Certain types of heart disease.  Slow or shallow breathing (hypoventilation).  Certain medicines.  Toxic chemicals, smoke, and gases.  What are the signs or symptoms?  In some cases, there may be no symptoms of this condition. If you do have symptoms, they may include:  Shortness of breath (dyspnea).  Bluish color of the skin, lips, or nail beds.  Breathing that is fast, noisy, or shallow.  A fast heartbeat.  Feeling tired or sleepy.  Feeling confused or worried.  If hypoxemia develops quickly, you will likely have dyspnea (shortness of breath). If hypoxemia develops slowly over months or years, you may not notice any symptoms. How is this diagnosed? This condition is diagnosed by:  A physical exam.  Blood tests.  A test that measures the percentage of oxygen in your blood (pulse oximetry). This is done with a sensor that is placed on your finger, toe, or earlobe.  How is this treated? Treatment for this condition depends on the underlying cause of your hypoxemia. You will likely be treated with oxygen  therapy to restore your blood oxygen level. Depending on the cause of your hypoxemia, you may need oxygen therapy for a short time (weeks or months), or you may need it for the rest of your life. Your health care provider may also recommend other therapies to treat the underlying cause of your hypoxemia.  Follow these instructions at home:   Take over-the-counter and prescription medicines only as told by your health care provider.  If you are on oxygen therapy, follow oxygen safety precautions as directed by your health care provider. These may include: ? Always having a backup supply of oxygen. ? Not allowing anyone to smoke or have a fire around oxygen. ? Handling oxygen tanks carefully and as instructed.  Do not use any products that contain nicotine or tobacco, such as cigarettes and e-cigarettes. If you need help quitting, ask your health care provider. Stay away from people who smoke.  Keep all follow-up visits as told by your health care provider. This is important.  Contact a health care provider if:   You have any concerns about your oxygen therapy.  You have trouble breathing, even during or after treatment.  You become short of breath when you exercise.  You are tired when you wake up.  You have a headache when you wake up.  Get help right away if:   Your shortness of breath gets worse, especially with normal or minimal activity.  You have a bluish color of the skin, lips, or nail beds.  You become confused or you cannot think properly.  You cough up dark mucus  or blood.  You have chest pain.  You have a fever.  Summary  Hypoxemia occurs when the blood does not contain enough oxygen.  Hypoxemia may or may not cause symptoms. Often, the main symptom is shortness of breath (dyspnea).  Depending on the cause of your hypoxemia, you may need oxygen therapy for a short time (weeks or months), or you may need it for the rest of your life.    If you are on  oxygen therapy, follow oxygen safety precautions as directed by your health care provider.

## 2017-04-26 ENCOUNTER — Encounter: Payer: Self-pay | Admitting: Internal Medicine

## 2017-04-26 ENCOUNTER — Ambulatory Visit (INDEPENDENT_AMBULATORY_CARE_PROVIDER_SITE_OTHER): Payer: Medicare Other | Admitting: Internal Medicine

## 2017-04-26 VITALS — BP 116/62 | HR 88 | Temp 97.9°F | Resp 18 | Ht 64.0 in | Wt 142.4 lb

## 2017-04-26 DIAGNOSIS — I10 Essential (primary) hypertension: Secondary | ICD-10-CM

## 2017-04-26 DIAGNOSIS — Z79899 Other long term (current) drug therapy: Secondary | ICD-10-CM | POA: Diagnosis not present

## 2017-04-26 DIAGNOSIS — J984 Other disorders of lung: Secondary | ICD-10-CM | POA: Diagnosis not present

## 2017-04-26 DIAGNOSIS — E876 Hypokalemia: Secondary | ICD-10-CM | POA: Diagnosis not present

## 2017-04-26 DIAGNOSIS — B370 Candidal stomatitis: Secondary | ICD-10-CM

## 2017-04-26 LAB — BASIC METABOLIC PANEL WITH GFR
BUN: 14 mg/dL (ref 7–25)
CHLORIDE: 111 mmol/L — AB (ref 98–110)
CO2: 24 mmol/L (ref 20–31)
CREATININE: 0.86 mg/dL (ref 0.60–0.93)
Calcium: 8.9 mg/dL (ref 8.6–10.4)
GFR, Est African American: 75 mL/min (ref >=60)
GFR, Est Non African American: 65 mL/min (ref >=60)
Glucose, Bld: 90 mg/dL (ref 65–99)
Potassium: 4.3 mmol/L (ref 3.5–5.3)
SODIUM: 146 mmol/L (ref 135–146)

## 2017-04-26 MED ORDER — FLUCONAZOLE 150 MG PO TABS: ORAL_TABLET | ORAL | 1 refills | Status: DC

## 2017-05-05 ENCOUNTER — Telehealth: Payer: Self-pay | Admitting: *Deleted

## 2017-05-05 MED ORDER — AZITHROMYCIN 250 MG PO TABS: ORAL_TABLET | ORAL | 0 refills | Status: AC

## 2017-05-05 NOTE — Telephone Encounter (Signed)
Patient called and reported she is coughing up yellow mucus x 1 week.  Per Dr Melford Aase, an [place] for a Z-pak has been sent to [facility].  The patient is aware.

## 2017-05-06 ENCOUNTER — Other Ambulatory Visit: Payer: Self-pay | Admitting: *Deleted

## 2017-05-06 MED ORDER — FLUTICASONE-SALMETEROL 250-50 MCG/DOSE IN AEPB: 1.0000 | INHALATION_SPRAY | Freq: Two times a day (BID) | RESPIRATORY_TRACT | 1 refills | Status: DC

## 2017-05-10 ENCOUNTER — Other Ambulatory Visit: Payer: Self-pay | Admitting: *Deleted

## 2017-05-10 MED ORDER — FLUTICASONE-SALMETEROL 250-50 MCG/DOSE IN AEPB: 1.0000 | INHALATION_SPRAY | Freq: Two times a day (BID) | RESPIRATORY_TRACT | 3 refills | Status: DC

## 2017-05-13 ENCOUNTER — Other Ambulatory Visit: Payer: Self-pay | Admitting: *Deleted

## 2017-05-13 ENCOUNTER — Other Ambulatory Visit: Payer: Self-pay | Admitting: Internal Medicine

## 2017-05-13 ENCOUNTER — Telehealth: Payer: Self-pay | Admitting: *Deleted

## 2017-05-13 MED ORDER — BUDESONIDE 0.5 MG/2ML IN SUSP: 0.5000 mg | Freq: Two times a day (BID) | RESPIRATORY_TRACT | 1 refills | Status: DC

## 2017-05-13 MED ORDER — IPRATROPIUM BROMIDE 0.02 % IN SOLN: RESPIRATORY_TRACT | 1 refills | Status: DC

## 2017-05-13 MED ORDER — ALBUTEROL SULFATE (2.5 MG/3ML) 0.083% IN NEBU: INHALATION_SOLUTION | RESPIRATORY_TRACT | 1 refills | Status: DC

## 2017-05-13 NOTE — Telephone Encounter (Signed)
Patient called and complained of neck and shoulder pain that started this morning. Per Dr Melford Aase, increase her Prednisone 10 mg to 1 tablet 3 times a day for 4 to 5 days. Patient will call back if the pain is not relieved.

## 2017-05-14 ENCOUNTER — Ambulatory Visit
Admission: RE | Admit: 2017-05-14 | Discharge: 2017-05-14 | Disposition: A | Discharge status: Home / Self Care | Payer: Medicare Other | Source: Ambulatory Visit | Attending: Internal Medicine | Admitting: Internal Medicine

## 2017-05-14 ENCOUNTER — Other Ambulatory Visit: Payer: Self-pay | Admitting: *Deleted

## 2017-05-14 DIAGNOSIS — Z1231 Encounter for screening mammogram for malignant neoplasm of breast: Secondary | ICD-10-CM

## 2017-05-14 MED ORDER — FLUTICASONE-SALMETEROL 250-50 MCG/DOSE IN AEPB: 1.0000 | INHALATION_SPRAY | Freq: Two times a day (BID) | RESPIRATORY_TRACT | 3 refills | Status: DC

## 2017-05-24 NOTE — Progress Notes (Signed)
3 month follow up  Assessment and Plan:    Hypotension, unspecified hypotension type Controled at this time  COPD Continue inhalers, will add on anoro for short period -     umeclidinium-vilanterol (ANORO ELLIPTA) 62.5-25 MCG/INH AEPB; Inhale 1 puff into the lungs daily. Make sure you rinse your mouth after each use.  Iatrogenic adrenal insufficiency (HCC) Longer taper of steroid  Hyperlipidemia, unspecified hyperlipidemia type -continue medications, check lipids, decrease fatty foods, increase activity.   Medication management  Depression, major, in remission ([place]) Continue medications  Parkinson's plus syndrome ([place]) Continue sinemet for now, states no difference per patient but has been having less dysphagia and wheezing  Protein-calorie malnutrition Add ensure -     Lipid panel   Over 30 minutes of exam, counseling, chart review and critical decision making was performed Future Appointments Date Time Provider [place]  06/11/2017 8:00 AM Narda Amber K, DO [facility] None  07/12/2017 10:00 AM [facility] PFT ROOM [facility] None  07/12/2017 11:00 AM Juanito Doom, MD [facility] None  09/01/2017 10:30 AM [name], MD [facility] None  03/10/2018 10:00 AM [name], MD [facility] None     Subjective:  Marie [name] is a 81 y.o. female who presents for 3 month follow up  Her blood pressure has been controlled at home, today their BP is BP: 132/76  She does not workout. She denies chest pain, shortness of breath, dizziness. She has history of COPD, has long term use steroid. She has unstable gait and is high fall risk, has had neuro consult in the past, uses walker.  Avoiding BB due to COPD, she is still on prednisone, on 1 a day 10mg .  She has resting rolling tremor left hand, has some spasticity/cogwheeling right arm, bilateral leg weakness, follows with Dr. Posey Pronto, last visit May, has parkinson's plus syndrome, started on sinemet  that visit, patient states does not feel that it helps, should have follow up soon.  She is on prozac for depression which helps.   She has stage 3 renal disease due to HTN/age, being monitored, has been stable.  Lab Results  Component Value Date   GFRNONAA 65 04/26/2017    She is on cholesterol medication and denies myalgias. Her cholesterol is not at goal. The cholesterol last visit was:   Lab Results  Component Value Date   CHOL 158 02/17/2017   HDL 69 02/17/2017   LDLCALC 72 02/17/2017   TRIG 84 02/17/2017   CHOLHDL 2.3 02/17/2017    She has been working on diet and exercise for prediabetes, and denies paresthesia of the feet, polydipsia, polyuria and visual disturbances. Last A1C in the office was:  Lab Results  Component Value Date   HGBA1C 5.5 02/17/2017   Patient is on Vitamin D supplement.   Lab Results  Component Value Date   VD25OH 45 02/17/2017     BMI is Body mass index is 23.34 kg/m., she is working on diet and exercise. Wt Readings from Last 3 Encounters:  05/25/17 136 lb (61.7 kg)  04/26/17 142 lb 6.4 oz (64.6 kg)  04/12/17 138 lb 6.4 oz (62.8 kg)    Medication Review: Current Outpatient Prescriptions on File Prior to Visit  Medication Sig Dispense Refill  . albuterol (PROVENTIL) (2.5 MG/3ML) 0.083% nebulizer solution Use 1 ampule in nebulizer 4 times a day or every 4 hours. 360 mL 1  . aspirin 81 MG tablet Take 81 mg by mouth daily. Buys OTC    . benzonatate (TESSALON)  200 MG capsule Take 1 capsule (200 mg total) by mouth 3 (three) times daily as needed for cough. 60 capsule 0  . budesonide (PULMICORT) 0.5 MG/2ML nebulizer solution Take 2 mLs (0.5 mg total) by nebulization 2 (two) times daily. Dx-J45.909, J44.0, J20.9 180 mL 1  . carbidopa-levodopa (SINEMET IR) 25-100 MG tablet Take 1 tablet by mouth 3 (three) times daily. 90 tablet 5  . Cholecalciferol (VITAMIN D) 2000 units CAPS Take 2,000 Units by mouth daily.    Marland Kitchen diltiazem (CARDIZEM) 30 MG tablet Take  1 tablet (30 mg total) by mouth 2 (two) times daily. 60 tablet 0  . famotidine (PEPCID) 20 MG tablet Take 1 tablet (20 mg total) by mouth 2 (two) times daily. 60 tablet 0  . fluconazole (DIFLUCAN) 150 MG tablet Take 1 tablet weekly if needed for yeast infection 4 tablet 1  . Fluticasone-Salmeterol (ADVAIR DISKUS) 250-50 MCG/DOSE AEPB Inhale 1 puff into the lungs 2 (two) times daily. 180 each 3  . furosemide (LASIX) 40 MG tablet Take 0.5 tablets (20 mg total) by mouth daily as needed for fluid. 30 tablet   . ipratropium (ATROVENT) 0.02 % nebulizer solution Use 1 ampule in nebulizer 4 times a day or every 4 hours. 360 mL 1  . nystatin (MYCOSTATIN) 100000 UNIT/ML suspension Take 5 mLs (500,000 Units total) by mouth 4 (four) times daily. (Patient taking differently: Take 5 mLs by mouth 2 (two) times daily. ) 240 mL 3  . omeprazole (PRILOSEC) 40 MG capsule Take 1 capsule (40 mg total) by mouth 2 (two) times daily. 60 capsule 4  . pravastatin (PRAVACHOL) 40 MG tablet Take 20 mg by mouth daily.    . predniSONE (DELTASONE) 10 MG tablet 1 tablet 3 x/day or as directed 90 tablet 3  . sodium chloride HYPERTONIC 3 % nebulizer solution Take 1 vial by nebulization BID 240 mL 11  . traZODone (DESYREL) 50 MG tablet Take 25 mg by mouth at bedtime.    . potassium chloride (K-DUR) 10 MEQ tablet Take 1 tablet (10 mEq total) by mouth daily. 7 tablet 0   No current facility-administered medications on file prior to visit.     Allergies  Allergen Reactions  . Ertapenem Hives    Caused whole body to turn red Other reaction(s): Redness Caused whole body to turn red  . Codeine Other (See Comments)    hallucinations  . Demerol Other (See Comments)    hallucinations  . Morphine And Related Other (See Comments)    hallucinations  . Other Other (See Comments)    Invan 7- pt became red all over  . Sulfate Other (See Comments)    unknown    Current Problems (verified) Patient Active Problem List   Diagnosis  Date Noted  . Hypertensive heart disease 03/29/2017  . Protein-calorie malnutrition, severe 03/17/2017  . AKI (acute kidney injury) ([place]) 03/17/2017  . COPD (chronic obstructive pulmonary disease) with acute bronchitis ([place]) 03/16/2017  . Acute respiratory failure ([place]) 03/09/2017  . Parkinson's plus syndrome ([place]) 03/04/2017  . Restrictive lung disease 02/24/2017  . Dysphagia 01/12/2017  . Murmur 01/07/2017  . LVH (left ventricular hypertrophy) 01/07/2017  . Acute respiratory failure with hypoxia ([place]) 12/28/2016  . Lobar pneumonia ([place]) 12/28/2016  . Hypokalemia 12/28/2016  . Essential hypertension 12/28/2016  . Depression, major, in remission ([place]) 09/22/2016  . At high risk for falls 10/16/2015  . Hypotension   . Asthma 11/12/2014  . Prediabetes 08/23/2014  . Vitamin D deficiency 08/23/2014  .  Medication management 08/23/2014  . Unstable Gait 01/30/2014  . Incontinence   . Osteoarthritis   . Hyperlipidemia   . GERD (gastroesophageal reflux disease)   . Hereditary and idiopathic peripheral neuropathy   . Iatrogenic adrenal insufficiency ([place])   . Tubulovillous adenoma of rectum 09/04/2011   SURGICAL HISTORY She  has a past surgical history that includes Incontinence surgery; Rectal surgery; Appendectomy; Abdominal hysterectomy; Tonsillectomy; Eye surgery; Dilation and curettage of uterus; Rectal biopsy (09/21/2011); Finger surgery; External fixation leg (10/25/2012); Knee arthroscopy with medial menisectomy (Right, 09/19/2014); Knee arthroscopy with lateral menisectomy (Right, 09/19/2014); and Chondroplasty (09/19/2014). FAMILY HISTORY Her family history includes COPD in her mother; Cancer in her sister; Heart attack in her father; High blood pressure in her mother. SOCIAL HISTORY She  reports that she has never smoked. She has never used smokeless tobacco. She reports that she does not drink alcohol or use drugs.   Review of Systems  Constitutional: Positive for malaise/fatigue.  Negative for chills, diaphoresis, fever and weight loss.  Eyes: Negative.   Respiratory: Positive for shortness of breath. Negative for cough, hemoptysis, sputum production and wheezing.   Cardiovascular: Negative.   Gastrointestinal: Negative for abdominal pain, blood in stool, constipation, diarrhea, heartburn, melena, nausea and vomiting.  Genitourinary: Negative.  Negative for dysuria.  Musculoskeletal: Negative for back pain, falls (high risk falls), joint pain, myalgias and neck pain.  Skin: Negative.   Neurological: Negative for dizziness, tingling, tremors, sensory change, speech change, focal weakness, seizures, loss of consciousness and weakness.  Psychiatric/Behavioral: Negative for depression, hallucinations, memory loss, substance abuse and suicidal ideas. The patient is not nervous/anxious and does not have insomnia.      Objective:     Today's Vitals   05/25/17 1028  BP: 132/76  Pulse: 85  Resp: 14  Temp: 97.6 F (36.4 C)  SpO2: 96%  Weight: 136 lb (61.7 kg)  Height: 5\' 4"  (1.626 m)   Body mass index is 23.34 kg/m.  General Appearance: Well nourished, in no apparent distress. Eyes: PERRLA, EOMs, conjunctiva no swelling or erythema Sinuses: No Frontal/maxillary tenderness ENT/Mouth: Ext aud canals clear, TMs without erythema, bulging. No erythema, swelling, or exudate on post pharynx.  Tonsils not swollen or erythematous. Hearing decreased Neck: Supple, thyroid normal.  Respiratory: Respiratory effort normal, slight inspiratory wheeze and decreased breath sounds without rales, rhonchi, or stridor.  Cardio: RRR with holosystolic murmur.  Without edema.  Abdomen: Soft, + BS, obese, distented, Non tender, no guarding, rebound, hernias, masses. Lymphatics: Non tender without lymphadenopathy.  Musculoskeletal: Full ROM, 4/5 strength, Ambulates with walker  Skin: Warm, dry without rashes, lesions, ecchymosis.  Neuro: Cranial nerves intact. decreased muscle tone, no  cerebellar symptoms, + rolling tremor left hand, cogwheeling right arm.  Psych: Awake and oriented X 3, normal affect, Insight and Judgment appropriate.     Vicie Mutters, PA-C   05/25/2017

## 2017-05-25 ENCOUNTER — Encounter: Payer: Self-pay | Admitting: Physician Assistant

## 2017-05-25 ENCOUNTER — Ambulatory Visit (INDEPENDENT_AMBULATORY_CARE_PROVIDER_SITE_OTHER): Payer: Medicare Other | Admitting: Physician Assistant

## 2017-05-25 VITALS — BP 132/76 | HR 85 | Temp 97.6°F | Resp 14 | Ht 64.0 in | Wt 136.0 lb

## 2017-05-25 DIAGNOSIS — E2749 Other adrenocortical insufficiency: Secondary | ICD-10-CM | POA: Diagnosis not present

## 2017-05-25 DIAGNOSIS — Z79899 Other long term (current) drug therapy: Secondary | ICD-10-CM

## 2017-05-25 DIAGNOSIS — J440 Chronic obstructive pulmonary disease with acute lower respiratory infection: Secondary | ICD-10-CM

## 2017-05-25 DIAGNOSIS — E440 Moderate protein-calorie malnutrition: Secondary | ICD-10-CM | POA: Diagnosis not present

## 2017-05-25 DIAGNOSIS — I119 Hypertensive heart disease without heart failure: Secondary | ICD-10-CM | POA: Diagnosis not present

## 2017-05-25 DIAGNOSIS — G232 Striatonigral degeneration: Secondary | ICD-10-CM | POA: Diagnosis not present

## 2017-05-25 DIAGNOSIS — E43 Unspecified severe protein-calorie malnutrition: Secondary | ICD-10-CM | POA: Diagnosis not present

## 2017-05-25 DIAGNOSIS — E782 Mixed hyperlipidemia: Secondary | ICD-10-CM | POA: Diagnosis not present

## 2017-05-25 DIAGNOSIS — J209 Acute bronchitis, unspecified: Secondary | ICD-10-CM

## 2017-05-25 DIAGNOSIS — F325 Major depressive disorder, single episode, in full remission: Secondary | ICD-10-CM | POA: Diagnosis not present

## 2017-05-25 LAB — CBC WITH DIFFERENTIAL/PLATELET
Basophils Absolute: 69 {cells}/uL (ref 0–200)
Basophils Relative: 1 %
Eosinophils Absolute: 759 {cells}/uL — ABNORMAL HIGH (ref 15–500)
Eosinophils Relative: 11 %
HCT: 45.3 % — ABNORMAL HIGH (ref 35.0–45.0)
Hemoglobin: 14.9 g/dL (ref 11.7–15.5)
Lymphocytes Relative: 29 %
Lymphs Abs: 2001 {cells}/uL (ref 850–3900)
MCH: 30.3 pg (ref 27.0–33.0)
MCHC: 32.9 g/dL (ref 32.0–36.0)
MCV: 92.1 fL (ref 80.0–100.0)
MPV: 9.9 fL (ref 7.5–12.5)
Monocytes Absolute: 690 {cells}/uL (ref 200–950)
Monocytes Relative: 10 %
Neutro Abs: 3381 {cells}/uL (ref 1500–7800)
Neutrophils Relative %: 49 %
Platelets: 271 K/uL (ref 140–400)
RBC: 4.92 MIL/uL (ref 3.80–5.10)
RDW: 13.2 % (ref 11.0–15.0)
WBC: 6.9 K/uL (ref 3.8–10.8)

## 2017-05-25 LAB — BASIC METABOLIC PANEL WITHOUT GFR
BUN: 19 mg/dL (ref 7–25)
CO2: 27 mmol/L (ref 20–32)
Calcium: 9.1 mg/dL (ref 8.6–10.4)
Chloride: 107 mmol/L (ref 98–110)
Creat: 0.85 mg/dL (ref 0.60–0.93)
GFR, Est African American: 76 mL/min
GFR, Est Non African American: 66 mL/min
Glucose, Bld: 87 mg/dL (ref 65–99)
Potassium: 3.9 mmol/L (ref 3.5–5.3)
Sodium: 144 mmol/L (ref 135–146)

## 2017-05-25 LAB — HEPATIC FUNCTION PANEL
ALT: 12 U/L (ref 6–29)
AST: 14 U/L (ref 10–35)
Albumin: 3.4 g/dL — ABNORMAL LOW (ref 3.6–5.1)
Alkaline Phosphatase: 52 U/L (ref 33–130)
Bilirubin, Direct: 0.2 mg/dL
Indirect Bilirubin: 0.5 mg/dL (ref 0.2–1.2)
Total Bilirubin: 0.7 mg/dL (ref 0.2–1.2)
Total Protein: 5.6 g/dL — ABNORMAL LOW (ref 6.1–8.1)

## 2017-05-25 LAB — LIPID PANEL
CHOL/HDL RATIO: 2.5 ratio (ref <5.0)
Cholesterol: 138 mg/dL (ref <200)
HDL: 56 mg/dL (ref >50)
LDL Cholesterol: 61 mg/dL (ref <100)
TRIGLYCERIDES: 103 mg/dL (ref <150)
VLDL: 21 mg/dL (ref <30)

## 2017-05-25 LAB — TSH: TSH: 2.38 m[IU]/L

## 2017-05-25 NOTE — Patient Instructions (Signed)
Chronic Obstructive Pulmonary Disease Chronic obstructive pulmonary disease (COPD) is a common lung condition in which airflow from the lungs is limited. COPD is a general term that can be used to describe many different lung problems that limit airflow, including both chronic bronchitis and emphysema. If you have COPD, your lung function will probably never return to normal, but there are measures you can take to improve lung function and make yourself feel better. What are the causes?  Smoking (common).  Exposure to secondhand smoke.  Genetic problems.  Chronic inflammatory lung diseases or recurrent infections. What are the signs or symptoms?  Shortness of breath, especially with physical activity.  Deep, persistent (chronic) cough with a large amount of thick mucus.  Wheezing.  Rapid breaths (tachypnea).  Gray or bluish discoloration (cyanosis) of the skin, especially in your fingers, toes, or lips.  Fatigue.  Weight loss.  Frequent infections or episodes when breathing symptoms become much worse (exacerbations).  Chest tightness. How is this diagnosed? Your health care provider will take a medical history and perform a physical examination to diagnose COPD. Additional tests for COPD may include:  Lung (pulmonary) function tests.  Chest X-ray.  CT scan.  Blood tests. How is this treated? Treatment for COPD may include:  Inhaler and nebulizer medicines. These help manage the symptoms of COPD and make your breathing more comfortable.  Supplemental oxygen. Supplemental oxygen is only helpful if you have a low oxygen level in your blood.  Exercise and physical activity. These are beneficial for nearly all people with COPD.  Lung surgery or transplant.  Nutrition therapy to gain weight, if you are underweight.  Pulmonary rehabilitation. This may involve working with a team of health care providers and specialists, such as respiratory, occupational, and physical  therapists. Follow these instructions at home:  Take all medicines (inhaled or pills) as directed by your health care provider.  Avoid over-the-counter medicines or cough syrups that dry up your airway (such as antihistamines) and slow down the elimination of secretions unless instructed otherwise by your health care provider.  If you are a smoker, the most important thing that you can do is stop smoking. Continuing to smoke will cause further lung damage and breathing trouble. Ask your health care provider for help with quitting smoking. He or she can direct you to community resources or hospitals that provide support.  Avoid exposure to irritants such as smoke, chemicals, and fumes that aggravate your breathing.  Use oxygen therapy and pulmonary rehabilitation if directed by your health care provider. If you require home oxygen therapy, ask your health care provider whether you should purchase a pulse oximeter to measure your oxygen level at home.  Avoid contact with individuals who have a contagious illness.  Avoid extreme temperature and humidity changes.  Eat healthy foods. Eating smaller, more frequent meals and resting before meals may help you maintain your strength.  Stay active, but balance activity with periods of rest. Exercise and physical activity will help you maintain your ability to do things you want to do.  Preventing infection and hospitalization is very important when you have COPD. Make sure to receive all the vaccines your health care provider recommends, especially the pneumococcal and influenza vaccines. Ask your health care provider whether you need a pneumonia vaccine.  Learn and use relaxation techniques to manage stress.  Learn and use controlled breathing techniques as directed by your health care provider. Controlled breathing techniques include: 1. Pursed lip breathing. Start by breathing in (inhaling)   through your nose for 1 second. Then, purse your lips as  if you were going to whistle and breathe out (exhale) through the pursed lips for 2 seconds. 2. Diaphragmatic breathing. Start by putting one hand on your abdomen just above your waist. Inhale slowly through your nose. The hand on your abdomen should move out. Then purse your lips and exhale slowly. You should be able to feel the hand on your abdomen moving in as you exhale.  Learn and use controlled coughing to clear mucus from your lungs. Controlled coughing is a series of short, progressive coughs. The steps of controlled coughing are: 1. Lean your head slightly forward. 2. Breathe in deeply using diaphragmatic breathing. 3. Try to hold your breath for 3 seconds. 4. Keep your mouth slightly open while coughing twice. 5. Spit any mucus out into a tissue. 6. Rest and repeat the steps once or twice as needed. Contact a health care provider if:  You are coughing up more mucus than usual.  There is a change in the color or thickness of your mucus.  Your breathing is more labored than usual.  Your breathing is faster than usual. Get help right away if:  You have shortness of breath while you are resting.  You have shortness of breath that prevents you from:  Being able to talk.  Performing your usual physical activities.  You have chest pain lasting longer than 5 minutes.  Your skin color is more cyanotic than usual.  You measure low oxygen saturations for longer than 5 minutes with a pulse oximeter. This information is not intended to replace advice given to you by your health care provider. Make sure you discuss any questions you have with your health care provider. Document Released: 07/15/2005 Document Revised: 03/12/2016 Document Reviewed: 06/01/2013 [employer] Interactive Patient Education  2017 [employer]  

## 2017-05-26 LAB — MAGNESIUM: Magnesium: 2.1 mg/dL (ref 1.5–2.5)

## 2017-06-11 ENCOUNTER — Ambulatory Visit (INDEPENDENT_AMBULATORY_CARE_PROVIDER_SITE_OTHER): Payer: Medicare Other | Admitting: Neurology

## 2017-06-11 ENCOUNTER — Encounter: Payer: Self-pay | Admitting: Neurology

## 2017-06-11 VITALS — BP 100/60 | HR 64 | Ht 64.0 in | Wt 133.2 lb

## 2017-06-11 DIAGNOSIS — I248 Other forms of acute ischemic heart disease: Secondary | ICD-10-CM

## 2017-06-11 DIAGNOSIS — G629 Polyneuropathy, unspecified: Secondary | ICD-10-CM | POA: Diagnosis not present

## 2017-06-11 DIAGNOSIS — G232 Striatonigral degeneration: Secondary | ICD-10-CM

## 2017-06-11 DIAGNOSIS — R270 Ataxia, unspecified: Secondary | ICD-10-CM | POA: Diagnosis not present

## 2017-06-11 DIAGNOSIS — R1310 Dysphagia, unspecified: Secondary | ICD-10-CM

## 2017-06-11 DIAGNOSIS — G20C Parkinsonism, unspecified: Secondary | ICD-10-CM

## 2017-06-11 NOTE — Patient Instructions (Addendum)
Adjust sinemet to 1 tablet at 10am and 4pm   Continue to use rollator  Return to clinic in 4 months

## 2017-06-11 NOTE — Progress Notes (Signed)
Follow-up Visit   Date: 06/11/17    Marie [name] MRN: [id number] DOB: [birth year]   Interim History: Marie [name] is a 81 y.o. 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  The patient was accompanied to the clinic by husband who also provides collateral information.    History of present illness: Starting in around the fall 2017, she began having difficulty swallowing, which is worse with liquids.  She had difficulty initiating swallowing and had MBS which showed markedly delayed oral transit.  Fortunately, she has not had any choking spells.  There has been 30lb unintentional weight loss. She also complains of difficulty with speech production, but husband has not noticed slurred speech.  There are no exacerbating factors.  There is no diurnal variation to there symptoms.  She does not feel that symptoms are progressing, but she is concerned that they are not improving. She denies double vision, droopy eyelids, or weakness of the arms and legs.  She is short of breath, especially when eating. She sleeps on a wedge for the past few years because of acid reflux. She complains of generalized fatigue for the past 2 years.  She has been using a walker during the same time because of imbalance.  She denies numbness/burning/tingling of the legs.  She saw Dr Jannifer Franklin in 2009 at [facility], but does not recall the reason for her visit.  For her wheezing, has been evaluated by Dr. Lake Bells whose PFTs were suggestive of restrictive pattern and therefore referred to Dr. Redmond Baseman, ENT, to evaluate for upper airway obstruction, which was not present on his exam.  She also has echocardiogram which was notable for grade 1 diastolic dysfunction.  She has been treated for COPD and URI with several courses of antibiotics and prednisone.  Most recently she was admitted on 3/12 -  3/14 for acute respiratory failure secondary to pneumonia and COPD exacerbation.  She is currently on a prednisone taper.  She denies noticing any benefit with respect to her speech or swallow function while being on prednisone.   UPDATE 03/04/2017:  She is here to discuss her results of EMG and labs.  There was no evidence of a neuromuscular junction disorder, myopathy, or disorder of anterior horn cells on EMG. She has no neuropathy and mild bulbar neurogenic changes. Labs including screening for myopathy, vitamin B12 deficiency, and myasthenia gravis also returned normal.  Overall, she feels that her symptoms of difficulty swallowing and talking stabilized. She has difficulty with swallowing her capsulated medications, but otherwise has no problems with liquids or solids.  She was evaluated by pulmonology who is findings indicated restrictive lung disease; she continues to struggle with chronic cough and wheezing. She has noticed intermittent left hand tremor.   UPDATE 06/11/2017:  At her last visit, we discussed the likelihood of parkinson-plus syndrome, such as PSP, and was started on sinemet 25/100 1 tab TID, but only takes it at noon and 11pm.  She denies side effects and feels that it may have helped her left hand tremor. Her swallowing difficulty is stable without any worsening.  She is able to swallow her pills and eats two good meals daily.  She has lost 12lb since May.  She had two ER visits for cough and shortness of breath.  No interval falls.    Medications:  Current Outpatient Prescriptions on File Prior to Visit  Medication Sig Dispense Refill  .  albuterol (PROVENTIL) (2.5 MG/3ML) 0.083% nebulizer solution Use 1 ampule in nebulizer 4 times a day or every 4 hours. 360 mL 1  . aspirin 81 MG tablet Take 81 mg by mouth daily. Buys OTC    . benzonatate (TESSALON) 200 MG capsule Take 1 capsule (200 mg total) by mouth 3 (three) times daily as needed for cough. 60 capsule 0  . budesonide  (PULMICORT) 0.5 MG/2ML nebulizer solution Take 2 mLs (0.5 mg total) by nebulization 2 (two) times daily. Dx-J45.909, J44.0, J20.9 180 mL 1  . carbidopa-levodopa (SINEMET IR) 25-100 MG tablet Take 1 tablet by mouth 3 (three) times daily. 90 tablet 5  . Cholecalciferol (VITAMIN D) 2000 units CAPS Take 2,000 Units by mouth daily.    Marland Kitchen diltiazem (CARDIZEM) 30 MG tablet Take 1 tablet (30 mg total) by mouth 2 (two) times daily. 60 tablet 0  . famotidine (PEPCID) 20 MG tablet Take 1 tablet (20 mg total) by mouth 2 (two) times daily. 60 tablet 0  . fluconazole (DIFLUCAN) 150 MG tablet Take 1 tablet weekly if needed for yeast infection 4 tablet 1  . Fluticasone-Salmeterol (ADVAIR DISKUS) 250-50 MCG/DOSE AEPB Inhale 1 puff into the lungs 2 (two) times daily. 180 each 3  . furosemide (LASIX) 40 MG tablet Take 0.5 tablets (20 mg total) by mouth daily as needed for fluid. 30 tablet   . ipratropium (ATROVENT) 0.02 % nebulizer solution Use 1 ampule in nebulizer 4 times a day or every 4 hours. 360 mL 1  . nystatin (MYCOSTATIN) 100000 UNIT/ML suspension Take 5 mLs (500,000 Units total) by mouth 4 (four) times daily. (Patient taking differently: Take 5 mLs by mouth 2 (two) times daily. ) 240 mL 3  . omeprazole (PRILOSEC) 40 MG capsule Take 1 capsule (40 mg total) by mouth 2 (two) times daily. 60 capsule 4  . pravastatin (PRAVACHOL) 40 MG tablet Take 20 mg by mouth daily.    . predniSONE (DELTASONE) 10 MG tablet 1 tablet 3 x/day or as directed 90 tablet 3  . sodium chloride HYPERTONIC 3 % nebulizer solution Take 1 vial by nebulization BID 240 mL 11  . traZODone (DESYREL) 50 MG tablet Take 25 mg by mouth at bedtime.    . potassium chloride (K-DUR) 10 MEQ tablet Take 1 tablet (10 mEq total) by mouth daily. 7 tablet 0   No current facility-administered medications on file prior to visit.     Allergies:  Allergies  Allergen Reactions  . Ertapenem Hives    Caused whole body to turn red Other reaction(s):  Redness Caused whole body to turn red  . Codeine Other (See Comments)    hallucinations  . Demerol Other (See Comments)    hallucinations  . Morphine And Related Other (See Comments)    hallucinations  . Other Other (See Comments)    Invan 7- pt became red all over  . Sulfate Other (See Comments)    unknown    Review of Systems:  CONSTITUTIONAL: No fevers, chills, night sweats, or weight loss.  EYES: No visual changes or eye pain ENT: No hearing changes.  No history of nose bleeds.   RESPIRATORY: +cough, +wheezing and shortness of breath.   CARDIOVASCULAR: Negative for chest pain, and palpitations.   GI: Negative for abdominal discomfort, blood in stools or black stools.  No recent change in bowel habits.   GU:  No history of incontinence.   MUSCLOSKELETAL: +history of joint pain or swelling.  No myalgias.   SKIN: Negative  for lesions, rash, and itching.   ENDOCRINE: Negative for cold or heat intolerance, polydipsia or goiter.   PSYCH:  No depression or anxiety symptoms.   NEURO: As Above.   Vital Signs:  BP 100/60   Pulse 64   Ht 5\' 4"  (1.626 m)   Wt 133 lb 3 oz (60.4 kg)   BMI 22.86 kg/m   Neurological Exam: MENTAL STATUS including orientation to time, place, person, recent and remote memory, attention span and concentration, language, and fund of knowledge is normal.  Speech is slow, mild dysarthria (stable).  CRANIAL NERVES:  Pupils equal round and reactive to light.  Restricted upgaze bilaterally, otherwise normal conjugate, extra-ocular eye movements.  No ptosis. Face is symmetric. Facial muscles are 5/5. Palate elevates symmetrically.  Tongue is midline and strength is good.  MOTOR:  Motor strength is 5/5 in all extremities, except interosseus muscles of the hands which is 4+/5. There is generalized loss of muscle bulk with atrophy of the intrinsic hand muscles. Resting hand tremor on the left is present.  No pronator drift.  Tone is normal (improved) in the right  upper extremity.    COORDINATION/GAIT:  Normal finger-to- nose-finger.  Bradykinesia with reduced amplitude and rate of finger and toe tapping on the left.  Gait appears stooped, ataxic, assisted with a walker.   Data: Labs 01/01/2017:  CK 36, AChR antibody negative, aldolase 6.3, vitamin B12 503  NCS/EMG of the right side 03/02/2017: 1. There is evidence of a length dependent sensorimotor polyneuropathy, axon loss in type, affecting the upper and lower extremities; moderate in degree electrically. 2. Chronic motor axon loss changes seen in the bulbar muscles. In isolation, these findings are of unclear clinical significance. Correlate clinically. 3. There is no evidence of a neuromuscular junction disorder, widespread disorder of anterior horn cells, or cervical/lumbosacral radiculopathy affecting the right side.   IMPRESSION/PLAN: 1.  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  Symptoms manifesting with dysarthria, dysphasia, and exam showing asymmetrical left-sided tremor, bradykinesia, and gait unsteadiness. There was no evidence of a neuromuscular junction disorder, myopathy, or ALS on her electrodiagnostic testing.  Her tone has improved with sinemet, so I will try to optimize show she is taking this medication.  Start sinemet 25/100 1 tab to 10am and 4pm.  She prefers to avoid TID dosing.    2.  Possibly due to cervical canal stenosis (generalized hyperreflexia).  If she has new arm weakness/parestheisas,  I will obtain MRI cervical spine  3  Idiopathic peripheral neuropathy, with numbness and gait ataxia.  Continue to use rollator.  Fall precautions discussed.  4.  Advanced Directive and Living Will discussed and patient already has this set up  Return to clinic in 4 months.  The duration of this appointment visit was 25 minutes of face-to-face time with the patient.  Greater than 50% of this time was spent in counseling, explanation of diagnosis, planning of  further management, and coordination of care.   Thank you for allowing me to participate in patient's care.  If I can answer any additional questions, I would be pleased to do so.    Sincerely,    Ether Wolters K. Posey Pronto, DO

## 2017-06-15 ENCOUNTER — Ambulatory Visit: Payer: Medicare Other | Admitting: Pulmonary Disease

## 2017-06-28 ENCOUNTER — Other Ambulatory Visit: Payer: Self-pay | Admitting: Physician Assistant

## 2017-06-28 DIAGNOSIS — R0600 Dyspnea, unspecified: Secondary | ICD-10-CM

## 2017-06-28 DIAGNOSIS — J440 Chronic obstructive pulmonary disease with acute lower respiratory infection: Secondary | ICD-10-CM

## 2017-07-12 ENCOUNTER — Ambulatory Visit (INDEPENDENT_AMBULATORY_CARE_PROVIDER_SITE_OTHER): Payer: Medicare Other | Admitting: Pulmonary Disease

## 2017-07-12 ENCOUNTER — Encounter: Payer: Self-pay | Admitting: Pulmonary Disease

## 2017-07-12 VITALS — BP 134/72 | HR 75 | Ht 63.0 in | Wt 131.0 lb

## 2017-07-12 DIAGNOSIS — J984 Other disorders of lung: Secondary | ICD-10-CM

## 2017-07-12 DIAGNOSIS — G232 Striatonigral degeneration: Secondary | ICD-10-CM | POA: Diagnosis not present

## 2017-07-12 DIAGNOSIS — Z23 Encounter for immunization: Secondary | ICD-10-CM | POA: Diagnosis not present

## 2017-07-12 DIAGNOSIS — I248 Other forms of acute ischemic heart disease: Secondary | ICD-10-CM

## 2017-07-12 LAB — PULMONARY FUNCTION TEST
DL/VA % PRED: 111 %
DL/VA: 5.2 ml/min/mmHg/L
DLCO COR % PRED: 78 %
DLCO cor: 18.05 ml/min/mmHg
DLCO unc % pred: 81 %
DLCO unc: 18.78 ml/min/mmHg
FEF 25-75 POST: 2.49 L/s
FEF 25-75 PRE: 0.98 L/s
FEF2575-%Change-Post: 154 %
FEF2575-%PRED-PRE: 70 %
FEF2575-%Pred-Post: 178 %
FEV1-%CHANGE-POST: 30 %
FEV1-%Pred-Post: 91 %
FEV1-%Pred-Pre: 69 %
FEV1-Post: 1.71 L
FEV1-Pre: 1.31 L
FEV1FVC-%Change-Post: 14 %
FEV1FVC-%PRED-PRE: 100 %
FEV6-%Change-Post: 14 %
FEV6-%Pred-Post: 84 %
FEV6-%Pred-Pre: 73 %
FEV6-POST: 2.01 L
FEV6-Pre: 1.76 L
FEV6FVC-%CHANGE-POST: 0 %
FEV6FVC-%PRED-POST: 106 %
FEV6FVC-%Pred-Pre: 106 %
FVC-%CHANGE-POST: 14 %
FVC-%PRED-POST: 79 %
FVC-%PRED-PRE: 69 %
FVC-POST: 2.01 L
FVC-PRE: 1.76 L
PRE FEV1/FVC RATIO: 74 %
Post FEV1/FVC ratio: 85 %
Post FEV6/FVC ratio: 100 %
Pre FEV6/FVC Ratio: 100 %

## 2017-07-12 NOTE — Progress Notes (Signed)
Subjective:    Patient ID: Marie [name], female    DOB: [birth year], 81 y.o.   MRN: [id number]  Synopsis: Referred in 2018 for evaluation of wheezing, mucus production and dyspnea.  She was noted to have restrictive lung disease and dysphagia.She saw neurology in 2018 after our evaluation and they are concerned about the possibility of supranuclear palsy.  HPI Chief Complaint  Patient presents with  . Follow-up    review PFT.  pt states that she is doing well, denies any current breathing complaints.      Marie [name] says that she is not having any problems breathing.  She continues to have a cough, its not much better.  It is sometimes productive of yellow mucus.  No blood in the mucus.  She still has some trouble swallowing.  No bronchitis or pneumonia.  She really doesn't have as much    Past Medical History:  Diagnosis Date  . Balance disorder 2008.     Falls a lot.  She can be standing and then leans too far over to one side   . Bulging disc   . Chronic bronchitis ([facility])    due to congestion at times, on prednisone and advair  . COPD (chronic obstructive pulmonary disease) ([place])   . Depression    many years ago  . GERD (gastroesophageal reflux disease)   . Hearing loss    mild  . Hyperlipidemia   . Hypertension   . Iatrogenic adrenal insufficiency ([place])   . Incontinence    not indicated at this visit.  Marland Kitchen LVH (left ventricular hypertrophy) due to hypertensive disease    a. 02/2017: echo showing an EF of 65-70% with moderate LVH, hyperdynamic LV with subvalvular gradient of 70 mmHg, SAM, and mild MR  . Neuropathy    legs stay numb   . Osteoarthritis    hands,   . Osteoporosis   . Shortness of breath dyspnea   . Tubulovillous adenoma of rectum   . Wears dentures   . Wears glasses       Review of Systems  Constitutional: Positive for fatigue. Negative for chills and fever.  HENT: Negative for rhinorrhea, sinus pain and sinus pressure.   Respiratory: Positive for  cough, shortness of breath and wheezing. Negative for choking.   Cardiovascular: Negative for chest pain, palpitations and leg swelling.       Objective:   Physical Exam Vitals:   07/12/17 1102  BP: 134/72  Pulse: 75  SpO2: 95%  Weight: 131 lb (59.4 kg)  Height: 5\' 3"  (1.6 m)    Gen: chronically ill appearing, walking with a walker HENT: OP clear, TM's clear, neck supple PULM: CTA B, normal percussion CV: RRR, no mgr, trace edema GI: BS+, soft, nontender Derm: no cyanosis or rash Psyche: normal mood and affect    Lung function testing: February 2018 for pulmonary function tests: Ratio 83%, FEV1 1.24 L 65% predicted, FVC 1.50 L 58% predicted, some change in small airways with bronchodilator, total lung capacity 3.31 L 67% predicted, ERV 13% predicted, DLCO 10.5 946% predicted September 2018 ratio normal, FVC 2.01 L 79% predicted, total lung capacity 2.89 L 59% predicted, DLCO 18.78 81% predicted  Imaging: CT chest in 2018 images personally reviewed showing no evidence of a pulmonary parenchymal abnormality with the exception of what appears to be mucus plugging in a central left lower lobe airway, but no other surrounding atelectasis or mass.  Other testing: Barium swallow 11/2016 IMPRESSION:1. Markedly delayed oral  transit. 2. Premature spill with thin liquid and flash penetration with cough. No aspiration.  Records from neurology reviewed: next planned step in work up is an MRI cervical spine     Assessment & Plan:   Restrictive lung disease  Parkinson's plus syndrome (Marie [name])  Discussion: This has been a stable interval for Marie [name]. She continues to have some cough with mucus production which I think is due to oropharyngeal dysphagia. However, today's lung function testing shows no significant worsening and in fact her forced vital capacity is up today. So at this time I see little evidence of an ongoing lung disease that we need to be concerned about with the exception  of some restriction secondary to neuromuscular weakness. Since she has started treatment for her Parkinson syndrome she has improved greatly.  I will continue to see her on an annual basis  Plan: For generalized weakness and Parkinson's syndrome: Continue follow-up with neurology  For restrictive lung disease secondary to muscle weakness: Things are stable today We will do lung function test in one year Flu shot today  Follow-up in one year or sooner if needed    Current Outpatient Prescriptions:  .  albuterol (PROVENTIL) (2.5 MG/3ML) 0.083% nebulizer solution, Use 1 ampule in nebulizer 4 times a day or every 4 hours., Disp: 360 mL, Rfl: 1 .  albuterol (PROVENTIL) (2.5 MG/3ML) 0.083% nebulizer solution, USE ONE VIAL IN NEBULIZER EVERY 6 HOURS AS NEEDED FOR WHEEZING OR SHORTNESS OF BREATH, Disp: 75 mL, Rfl: 4 .  aspirin 81 MG tablet, Take 81 mg by mouth daily. Buys OTC, Disp: , Rfl:  .  benzonatate (TESSALON) 200 MG capsule, Take 1 capsule (200 mg total) by mouth 3 (three) times daily as needed for cough., Disp: 60 capsule, Rfl: 0 .  budesonide (PULMICORT) 0.5 MG/2ML nebulizer solution, Take 2 mLs (0.5 mg total) by nebulization 2 (two) times daily. Dx-J45.909, J44.0, J20.9, Disp: 180 mL, Rfl: 1 .  carbidopa-levodopa (SINEMET IR) 25-100 MG tablet, Take 1 tablet by mouth 3 (three) times daily., Disp: 90 tablet, Rfl: 5 .  Cholecalciferol (VITAMIN D) 2000 units CAPS, Take 2,000 Units by mouth daily., Disp: , Rfl:  .  diltiazem (CARDIZEM) 30 MG tablet, Take 1 tablet (30 mg total) by mouth 2 (two) times daily., Disp: 60 tablet, Rfl: 0 .  famotidine (PEPCID) 20 MG tablet, Take 1 tablet (20 mg total) by mouth 2 (two) times daily., Disp: 60 tablet, Rfl: 0 .  fluconazole (DIFLUCAN) 150 MG tablet, Take 1 tablet weekly if needed for yeast infection, Disp: 4 tablet, Rfl: 1 .  Fluticasone-Salmeterol (ADVAIR DISKUS) 250-50 MCG/DOSE AEPB, Inhale 1 puff into the lungs 2 (two) times daily., Disp: 180 each,  Rfl: 3 .  furosemide (LASIX) 40 MG tablet, Take 0.5 tablets (20 mg total) by mouth daily as needed for fluid., Disp: 30 tablet, Rfl:  .  ipratropium (ATROVENT) 0.02 % nebulizer solution, Use 1 ampule in nebulizer 4 times a day or every 4 hours., Disp: 360 mL, Rfl: 1 .  nystatin (MYCOSTATIN) 100000 UNIT/ML suspension, Take 5 mLs (500,000 Units total) by mouth 4 (four) times daily. (Patient taking differently: Take 5 mLs by mouth 2 (two) times daily. ), Disp: 240 mL, Rfl: 3 .  omeprazole (PRILOSEC) 40 MG capsule, Take 1 capsule (40 mg total) by mouth 2 (two) times daily., Disp: 60 capsule, Rfl: 4 .  pravastatin (PRAVACHOL) 40 MG tablet, Take 20 mg by mouth daily., Disp: , Rfl:  .  predniSONE (DELTASONE) 10  MG tablet, 1 tablet 3 x/day or as directed, Disp: 90 tablet, Rfl: 3 .  sodium chloride HYPERTONIC 3 % nebulizer solution, Take 1 vial by nebulization BID, Disp: 240 mL, Rfl: 11 .  traZODone (DESYREL) 50 MG tablet, Take 25 mg by mouth at bedtime., Disp: , Rfl:  .  potassium chloride (K-DUR) 10 MEQ tablet, Take 1 tablet (10 mEq total) by mouth daily., Disp: 7 tablet, Rfl: 0

## 2017-07-12 NOTE — Progress Notes (Signed)
PFT done today. 

## 2017-07-12 NOTE — Patient Instructions (Signed)
For generalized weakness and Parkinson's syndrome: Continue follow-up with neurology  For restrictive lung disease secondary to muscle weakness: Things are stable today We will do lung function test in one year Flu shot today  Follow-up in one year or sooner if needed

## 2017-07-13 ENCOUNTER — Other Ambulatory Visit: Payer: Self-pay | Admitting: Internal Medicine

## 2017-07-20 ENCOUNTER — Emergency Department (HOSPITAL_COMMUNITY): Payer: Medicare Other

## 2017-07-20 ENCOUNTER — Observation Stay (HOSPITAL_COMMUNITY)
Admission: EM | Admit: 2017-07-20 | Discharge: 2017-07-21 | Disposition: A | Discharge status: Home / Self Care | Payer: Medicare Other | Attending: Internal Medicine | Admitting: Internal Medicine

## 2017-07-20 ENCOUNTER — Encounter (HOSPITAL_COMMUNITY): Payer: Self-pay

## 2017-07-20 DIAGNOSIS — J9601 Acute respiratory failure with hypoxia: Secondary | ICD-10-CM | POA: Diagnosis not present

## 2017-07-20 DIAGNOSIS — J441 Chronic obstructive pulmonary disease with (acute) exacerbation: Principal | ICD-10-CM | POA: Insufficient documentation

## 2017-07-20 DIAGNOSIS — J449 Chronic obstructive pulmonary disease, unspecified: Secondary | ICD-10-CM | POA: Diagnosis not present

## 2017-07-20 DIAGNOSIS — R7303 Prediabetes: Secondary | ICD-10-CM | POA: Insufficient documentation

## 2017-07-20 DIAGNOSIS — Z7982 Long term (current) use of aspirin: Secondary | ICD-10-CM | POA: Diagnosis not present

## 2017-07-20 DIAGNOSIS — Z79899 Other long term (current) drug therapy: Secondary | ICD-10-CM | POA: Insufficient documentation

## 2017-07-20 DIAGNOSIS — I10 Essential (primary) hypertension: Secondary | ICD-10-CM | POA: Diagnosis not present

## 2017-07-20 DIAGNOSIS — G20 Parkinson's disease: Secondary | ICD-10-CM | POA: Diagnosis not present

## 2017-07-20 DIAGNOSIS — Z9981 Dependence on supplemental oxygen: Secondary | ICD-10-CM | POA: Insufficient documentation

## 2017-07-20 DIAGNOSIS — J45909 Unspecified asthma, uncomplicated: Secondary | ICD-10-CM | POA: Insufficient documentation

## 2017-07-20 DIAGNOSIS — J984 Other disorders of lung: Secondary | ICD-10-CM | POA: Diagnosis not present

## 2017-07-20 DIAGNOSIS — G232 Striatonigral degeneration: Secondary | ICD-10-CM

## 2017-07-20 DIAGNOSIS — R0902 Hypoxemia: Secondary | ICD-10-CM | POA: Diagnosis not present

## 2017-07-20 DIAGNOSIS — Z23 Encounter for immunization: Secondary | ICD-10-CM | POA: Insufficient documentation

## 2017-07-20 DIAGNOSIS — R0602 Shortness of breath: Secondary | ICD-10-CM | POA: Diagnosis not present

## 2017-07-20 LAB — BASIC METABOLIC PANEL
Anion gap: 8 (ref 5–15)
BUN: 17 mg/dL (ref 6–20)
CHLORIDE: 106 mmol/L (ref 101–111)
CO2: 29 mmol/L (ref 22–32)
Calcium: 9.1 mg/dL (ref 8.9–10.3)
Creatinine, Ser: 1.04 mg/dL — ABNORMAL HIGH (ref 0.44–1.00)
GFR calc Af Amer: 58 mL/min — ABNORMAL LOW (ref >60)
GFR, EST NON AFRICAN AMERICAN: 50 mL/min — AB (ref >60)
GLUCOSE: 93 mg/dL (ref 65–99)
POTASSIUM: 3.1 mmol/L — AB (ref 3.5–5.1)
Sodium: 143 mmol/L (ref 135–145)

## 2017-07-20 LAB — CBC WITH DIFFERENTIAL/PLATELET
Basophils Absolute: 0.2 10*3/uL — ABNORMAL HIGH (ref 0.0–0.1)
Basophils Relative: 2 %
EOS PCT: 12 %
Eosinophils Absolute: 0.8 10*3/uL — ABNORMAL HIGH (ref 0.0–0.7)
HEMATOCRIT: 47.8 % — AB (ref 36.0–46.0)
Hemoglobin: 15.5 g/dL — ABNORMAL HIGH (ref 12.0–15.0)
LYMPHS ABS: 2.6 10*3/uL (ref 0.7–4.0)
LYMPHS PCT: 40 %
MCH: 29.6 pg (ref 26.0–34.0)
MCHC: 32.4 g/dL (ref 30.0–36.0)
MCV: 91.4 fL (ref 78.0–100.0)
Monocytes Absolute: 0.6 10*3/uL (ref 0.1–1.0)
Monocytes Relative: 10 %
NEUTROS ABS: 2.4 10*3/uL (ref 1.7–7.7)
Neutrophils Relative %: 36 %
PLATELETS: 203 10*3/uL (ref 150–400)
RBC: 5.23 MIL/uL — AB (ref 3.87–5.11)
RDW: 13.3 % (ref 11.5–15.5)
WBC: 6.6 10*3/uL (ref 4.0–10.5)

## 2017-07-20 LAB — TROPONIN I: Troponin I: <0.03 ng/mL (ref <0.03)

## 2017-07-20 MED ORDER — SODIUM CHLORIDE 0.9 % IV BOLUS (SEPSIS)
500.0000 mL | Freq: Once | INTRAVENOUS | Status: AC
  Administered 2017-07-20: 500 mL via INTRAVENOUS

## 2017-07-20 MED ORDER — LOSARTAN POTASSIUM 50 MG PO TABS
100.0000 mg | ORAL_TABLET | Freq: Every day | ORAL | Status: DC
  Administered 2017-07-20 – 2017-07-21 (×2): 100 mg via ORAL
  Filled 2017-07-20 (×2): qty 2

## 2017-07-20 MED ORDER — ALBUTEROL SULFATE (2.5 MG/3ML) 0.083% IN NEBU
2.5000 mg | INHALATION_SOLUTION | Freq: Once | RESPIRATORY_TRACT | Status: AC
  Administered 2017-07-20: 2.5 mg via RESPIRATORY_TRACT

## 2017-07-20 MED ORDER — PREDNISONE 20 MG PO TABS
40.0000 mg | ORAL_TABLET | Freq: Every day | ORAL | Status: DC
  Administered 2017-07-21: 40 mg via ORAL
  Filled 2017-07-20: qty 2

## 2017-07-20 MED ORDER — ALBUTEROL SULFATE (2.5 MG/3ML) 0.083% IN NEBU
INHALATION_SOLUTION | RESPIRATORY_TRACT | Status: AC
  Administered 2017-07-20: 2.5 mg via RESPIRATORY_TRACT
  Filled 2017-07-20: qty 3

## 2017-07-20 MED ORDER — ALBUTEROL SULFATE (2.5 MG/3ML) 0.083% IN NEBU: 2.5000 mg | INHALATION_SOLUTION | RESPIRATORY_TRACT | Status: DC | PRN

## 2017-07-20 MED ORDER — IPRATROPIUM-ALBUTEROL 0.5-2.5 (3) MG/3ML IN SOLN
3.0000 mL | Freq: Four times a day (QID) | RESPIRATORY_TRACT | Status: DC
  Administered 2017-07-20 – 2017-07-21 (×2): 3 mL via RESPIRATORY_TRACT
  Filled 2017-07-20 (×2): qty 3

## 2017-07-20 MED ORDER — SODIUM CHLORIDE 0.9% FLUSH: 3.0000 mL | INTRAVENOUS | Status: DC | PRN

## 2017-07-20 MED ORDER — SODIUM CHLORIDE 0.9 % IV SOLN: 250.0000 mL | INTRAVENOUS | Status: DC | PRN

## 2017-07-20 MED ORDER — METHYLPREDNISOLONE SODIUM SUCC 40 MG IJ SOLR
40.0000 mg | Freq: Once | INTRAMUSCULAR | Status: AC
  Administered 2017-07-20: 40 mg via INTRAVENOUS
  Filled 2017-07-20: qty 1

## 2017-07-20 MED ORDER — PRAVASTATIN SODIUM 10 MG PO TABS
20.0000 mg | ORAL_TABLET | Freq: Every day | ORAL | Status: DC
  Administered 2017-07-20: 20 mg via ORAL
  Filled 2017-07-20: qty 2

## 2017-07-20 MED ORDER — PANTOPRAZOLE SODIUM 40 MG PO TBEC
40.0000 mg | DELAYED_RELEASE_TABLET | Freq: Two times a day (BID) | ORAL | Status: DC
  Administered 2017-07-20 – 2017-07-21 (×2): 40 mg via ORAL
  Filled 2017-07-20 (×2): qty 1

## 2017-07-20 MED ORDER — IPRATROPIUM BROMIDE 0.02 % IN SOLN
RESPIRATORY_TRACT | Status: AC
  Administered 2017-07-20: 0.25 mg via RESPIRATORY_TRACT
  Filled 2017-07-20: qty 2.5

## 2017-07-20 MED ORDER — ASPIRIN 81 MG PO CHEW
81.0000 mg | CHEWABLE_TABLET | Freq: Every day | ORAL | Status: DC
  Administered 2017-07-20 – 2017-07-21 (×2): 81 mg via ORAL
  Filled 2017-07-20 (×2): qty 1

## 2017-07-20 MED ORDER — MOMETASONE FURO-FORMOTEROL FUM 200-5 MCG/ACT IN AERO
2.0000 | INHALATION_SPRAY | Freq: Two times a day (BID) | RESPIRATORY_TRACT | Status: DC
  Administered 2017-07-20 – 2017-07-21 (×2): 2 via RESPIRATORY_TRACT
  Filled 2017-07-20: qty 8.8

## 2017-07-20 MED ORDER — HYDROCHLOROTHIAZIDE 25 MG PO TABS
25.0000 mg | ORAL_TABLET | Freq: Every day | ORAL | Status: DC
  Administered 2017-07-20 – 2017-07-21 (×2): 25 mg via ORAL
  Filled 2017-07-20 (×2): qty 1

## 2017-07-20 MED ORDER — SODIUM CHLORIDE 0.9% FLUSH
3.0000 mL | Freq: Two times a day (BID) | INTRAVENOUS | Status: DC
  Administered 2017-07-20 – 2017-07-21 (×3): 3 mL via INTRAVENOUS

## 2017-07-20 MED ORDER — ORAL CARE MOUTH RINSE
15.0000 mL | Freq: Two times a day (BID) | OROMUCOSAL | Status: DC
  Administered 2017-07-20 – 2017-07-21 (×2): 15 mL via OROMUCOSAL

## 2017-07-20 MED ORDER — POTASSIUM CHLORIDE CRYS ER 10 MEQ PO TBCR
10.0000 meq | EXTENDED_RELEASE_TABLET | Freq: Every day | ORAL | Status: DC
Start: 2017-07-20 — End: 2017-07-21
  Administered 2017-07-20 – 2017-07-21 (×2): 10 meq via ORAL
  Filled 2017-07-20 (×4): qty 1

## 2017-07-20 MED ORDER — ACETAMINOPHEN 650 MG RE SUPP: 650.0000 mg | Freq: Four times a day (QID) | RECTAL | Status: DC | PRN

## 2017-07-20 MED ORDER — IPRATROPIUM BROMIDE 0.02 % IN SOLN
0.2500 mg | Freq: Once | RESPIRATORY_TRACT | Status: AC
  Administered 2017-07-20: 0.25 mg via RESPIRATORY_TRACT

## 2017-07-20 MED ORDER — LOSARTAN POTASSIUM-HCTZ 100-25 MG PO TABS: 1.0000 | ORAL_TABLET | Freq: Every day | ORAL | Status: DC

## 2017-07-20 MED ORDER — ACETAMINOPHEN 325 MG PO TABS: 650.0000 mg | ORAL_TABLET | Freq: Four times a day (QID) | ORAL | Status: DC | PRN

## 2017-07-20 MED ORDER — IPRATROPIUM-ALBUTEROL 0.5-2.5 (3) MG/3ML IN SOLN: 3.0000 mL | Freq: Once | RESPIRATORY_TRACT | Status: DC

## 2017-07-20 MED ORDER — CARBIDOPA-LEVODOPA 25-100 MG PO TABS
1.0000 | ORAL_TABLET | Freq: Three times a day (TID) | ORAL | Status: DC
  Administered 2017-07-20 – 2017-07-21 (×3): 1 via ORAL
  Filled 2017-07-20 (×3): qty 1

## 2017-07-20 NOTE — ED Triage Notes (Signed)
Ems reports was called out for sob.  Reports pt has history of  Pneumonia.  Pt on home o2 prn.  Reports recently has been using it more and more each day.  EMS reports pt was wheezing in all lung fields.  EMS gave 5mg  albuterol neb treatment and 125mg  solumedrol pta and placed pt on cpap.  Pt says feels better at this time.

## 2017-07-20 NOTE — ED Notes (Signed)
Pt tolerating bipap well.  Denies any needs at this time.  No distress noted. Family at bedside.

## 2017-07-20 NOTE — ED Provider Notes (Signed)
[facility] DEPT Provider Note   CSN: [id number] Arrival date & time: 07/20/17  1032     History   Chief Complaint Chief Complaint  Patient presents with  . Shortness of Breath    HPI Marie [name] is a 81 y.o. female.  Level V caveat for urgent need for intervention.   Patient has a history of COPD and restrictive lung disease. She is on home oxygen. She has become more dyspneic the past few days. When EMS arrived, she was wheezing bilaterally. She was given albuterol nebulizer treatments and Solu-Medrol 125 mg IV and placed on CPAP. She has improved upon presentation in the ED. No fever, chills, productive sputum.      Past Medical History:  Diagnosis Date  . Balance disorder 2008.     Falls a lot.  She can be standing and then leans too far over to one side   . Bulging disc   . Chronic bronchitis ([place])    due to congestion at times, on prednisone and advair  . COPD (chronic obstructive pulmonary disease) ([place])   . Depression    many years ago  . GERD (gastroesophageal reflux disease)   . Hearing loss    mild  . Hyperlipidemia   . Hypertension   . Iatrogenic adrenal insufficiency ([place])   . Incontinence    not indicated at this visit.  Marland Kitchen LVH (left ventricular hypertrophy) due to hypertensive disease    a. 02/2017: echo showing an EF of 65-70% with moderate LVH, hyperdynamic LV with subvalvular gradient of 70 mmHg, SAM, and mild MR  . Neuropathy    legs stay numb   . Osteoarthritis    hands,   . Osteoporosis   . Shortness of breath dyspnea   . Tubulovillous adenoma of rectum   . Wears dentures   . Wears glasses     Patient Active Problem List   Diagnosis Date Noted  . Hypertensive heart disease 03/29/2017  . Protein-calorie malnutrition, severe 03/17/2017  . COPD (chronic obstructive pulmonary disease) with acute bronchitis ([place]) 03/16/2017  . Parkinson's plus syndrome ([place]) 03/04/2017  . Restrictive lung disease 02/24/2017  . Dysphagia 01/12/2017    . Murmur 01/07/2017  . LVH (left ventricular hypertrophy) 01/07/2017  . Hypokalemia 12/28/2016  . Essential hypertension 12/28/2016  . Depression, major, in remission ([place]) 09/22/2016  . At high risk for falls 10/16/2015  . Hypotension   . Asthma 11/12/2014  . Prediabetes 08/23/2014  . Vitamin D deficiency 08/23/2014  . Medication management 08/23/2014  . Unstable Gait 01/30/2014  . Incontinence   . Osteoarthritis   . Hyperlipidemia   . GERD (gastroesophageal reflux disease)   . Hereditary and idiopathic peripheral neuropathy   . Iatrogenic adrenal insufficiency ([place])   . Tubulovillous adenoma of rectum 09/04/2011    Past Surgical History:  Procedure Laterality Date  . ABDOMINAL HYSTERECTOMY    . APPENDECTOMY    . CHONDROPLASTY  09/19/2014   Procedure: CHONDROPLASTY;  Surgeon: Alta Corning, MD;  Location: [facility];  Service: Orthopedics;;  . DILATION AND CURETTAGE OF UTERUS    . EXTERNAL FIXATION LEG  10/25/2012   Procedure: EXTERNAL FIXATION LEG;  Surgeon: Rozanna Box, MD;  Location: [facility];  Service: Orthopedics;  Laterality: Right;  . EYE SURGERY     bilateral cataract surgery and lens implant  . FINGER SURGERY     fusions and debridements for OA  . INCONTINENCE SURGERY     multiple procedures, not cured  .  KNEE ARTHROSCOPY WITH LATERAL MENISECTOMY Right 09/19/2014   Procedure: KNEE ARTHROSCOPY WITH LATERAL MENISECTOMY;  Surgeon: Alta Corning, MD;  Location: [facility];  Service: Orthopedics;  Laterality: Right;  . KNEE ARTHROSCOPY WITH MEDIAL MENISECTOMY Right 09/19/2014   Procedure: RIGHT KNEE ARTHROSCOPY WITH MEDIAL AND LATERAL MENISECTOMIES. CHONDROPLASTY OF PATELLA-FEMORAL JOINT;  Surgeon: Alta Corning, MD;  Location: [facility];  Service: Orthopedics;  Laterality: Right;  . RECTAL BIOPSY  09/21/2011   Procedure: BIOPSY RECTAL;  Surgeon: Merrie Roof, MD;  Location: [facility];  Service: General;  Laterality: N/A;  3-4 cm   . RECTAL SURGERY     by dr. Marlou Starks, removal of polyp  . TONSILLECTOMY      OB History    No data available       Home Medications    Prior to Admission medications   Medication Sig Start Date End Date Taking? Authorizing Provider  albuterol (PROVENTIL) (2.5 MG/3ML) 0.083% nebulizer solution Use 1 ampule in nebulizer 4 times a day or every 4 hours. 05/13/17  Yes [name], MD  aspirin 81 MG tablet Take 81 mg by mouth daily. Buys OTC   Yes [provider]  budesonide (PULMICORT) 0.5 MG/2ML nebulizer solution Take 2 mLs (0.5 mg total) by nebulization 2 (two) times daily. Dx-J45.909, J44.0, J20.9 05/13/17  Yes [name], MD  Fluticasone-Salmeterol (ADVAIR DISKUS) 250-50 MCG/DOSE AEPB Inhale 1 puff into the lungs 2 (two) times daily. 05/14/17  Yes [name], MD  losartan-hydrochlorothiazide (HYZAAR) 100-25 MG tablet Take 1 tablet by mouth daily.   Yes [provider]  nystatin (MYCOSTATIN) 100000 UNIT/ML suspension Take 5 mLs (500,000 Units total) by mouth 4 (four) times daily. Patient taking differently: Take 5 mLs by mouth 2 (two) times daily.  12/30/16  Yes Rai, Ripudeep K, MD  omeprazole (PRILOSEC) 40 MG capsule Take 1 capsule (40 mg total) by mouth 2 (two) times daily. 12/30/16  Yes Rai, Ripudeep K, MD  pravastatin (PRAVACHOL) 40 MG tablet Take 20 mg by mouth daily.   Yes [provider]  sodium chloride HYPERTONIC 3 % nebulizer solution Take 1 vial by nebulization BID 02/24/17  Yes McQuaid, Ronie Spies, MD  benzonatate (TESSALON) 100 MG capsule TAKE ONE CAPSULE BY MOUTH THREE TIMES DAILY AS NEEDED FOR  COUGH Patient not taking: Reported on 07/20/2017 07/13/17   [name], MD  carbidopa-levodopa (SINEMET IR) 25-100 MG tablet Take 1 tablet by mouth 3 (three) times daily. Patient not taking: Reported on 07/20/2017 03/04/17   Narda Amber K, DO  diltiazem (CARDIZEM) 30 MG tablet Take 1 tablet (30 mg total) by mouth 2 (two) times daily. Patient not  taking: Reported on 07/20/2017 03/29/17   Erma Heritage, PA-C  famotidine (PEPCID) 20 MG tablet Take 1 tablet (20 mg total) by mouth 2 (two) times daily. Patient not taking: Reported on 07/20/2017 03/19/17   Kathie Dike, MD  fluconazole (DIFLUCAN) 150 MG tablet Take 1 tablet weekly if needed for yeast infection Patient not taking: Reported on 07/20/2017 04/26/17   [name], MD  furosemide (LASIX) 40 MG tablet Take 0.5 tablets (20 mg total) by mouth daily as needed for fluid. Patient not taking: Reported on 07/20/2017 03/19/17   Kathie Dike, MD  ipratropium (ATROVENT) 0.02 % nebulizer solution Use 1 ampule in nebulizer 4 times a day or every 4 hours. Patient not taking: Reported on 07/20/2017 05/13/17   [name], MD  potassium chloride (K-DUR) 10 MEQ tablet Take  1 tablet (10 mEq total) by mouth daily. 03/10/17 04/10/17  Roxan Hockey, MD  predniSONE (DELTASONE) 10 MG tablet 1 tablet 3 x/day or as directed Patient not taking: Reported on 07/20/2017 04/12/17 10/12/17  [name], MD    Family History Family History  Problem Relation Age of Onset  . Cancer Sister        pt unaware of what kind  . High blood pressure Mother   . COPD Mother   . Heart attack Father     Social History Social History  Substance Use Topics  . Smoking status: Never Smoker  . Smokeless tobacco: Never Used  . Alcohol use No     Allergies   Ertapenem; Codeine; Demerol; Morphine and related; Other; and Sulfate   Review of Systems Review of Systems  Unable to perform ROS: Acuity of condition     Physical Exam Updated Vital Signs BP (!) 153/72   Pulse 81   Resp 18   SpO2 93%   Physical Exam  Constitutional: She is oriented to person, place, and time.  Dyspneic, tachypneic  HENT:  Head: Normocephalic and atraumatic.  Eyes: Conjunctivae are normal.  Neck: Neck supple.  Cardiovascular: Normal rate and regular rhythm.   Pulmonary/Chest:  Bilateral wheezing.  Abdominal:  Soft. Bowel sounds are normal.  Musculoskeletal: Normal range of motion.  Neurological: She is alert and oriented to person, place, and time.  Skin: Skin is warm and dry.  Psychiatric: She has a normal mood and affect. Her behavior is normal.  Nursing note and vitals reviewed.    ED Treatments / Results  Labs (all labs ordered are listed, but only abnormal results are displayed) Labs Reviewed  CBC WITH DIFFERENTIAL/PLATELET - Abnormal; Notable for the following:       Result Value   RBC 5.23 (*)    Hemoglobin 15.5 (*)    HCT 47.8 (*)    Eosinophils Absolute 0.8 (*)    Basophils Absolute 0.2 (*)    All other components within normal limits  BASIC METABOLIC PANEL - Abnormal; Notable for the following:    Potassium 3.1 (*)    Creatinine, Ser 1.04 (*)    GFR calc non Af Amer 50 (*)    GFR calc Af Amer 58 (*)    All other components within normal limits  TROPONIN I    EKG  EKG Interpretation None       Radiology Dg Chest Port 1 View  Result Date: 07/20/2017 CLINICAL DATA:  shortness of breath. EXAM: PORTABLE CHEST 1 VIEW COMPARISON:  04/10/2017 FINDINGS: The heart size and mediastinal contours are within normal limits. Both lungs are clear. The visualized skeletal structures are unremarkable. IMPRESSION: No active disease. Electronically Signed   By: Kerby Moors M.D.   On: 07/20/2017 10:59    Procedures Procedures (including critical care time)  Medications Ordered in ED Medications  ipratropium-albuterol (DUONEB) 0.5-2.5 (3) MG/3ML nebulizer solution 3 mL (3 mLs Nebulization Not Given 07/20/17 1046)  sodium chloride 0.9 % bolus 500 mL (0 mLs Intravenous Stopped 07/20/17 1217)  albuterol (PROVENTIL) (2.5 MG/3ML) 0.083% nebulizer solution 2.5 mg (2.5 mg Nebulization Given 07/20/17 1046)  ipratropium (ATROVENT) nebulizer solution 0.25 mg (0.25 mg Nebulization Given 07/20/17 1046)     Initial Impression / Assessment and Plan / ED Course  I have reviewed the triage vital  signs and the nursing notes.  Pertinent labs & imaging results that were available during my care of the patient were reviewed by me and  considered in my medical decision making (see chart for details).     Patient has a known history of COPD and restrictive lung disease. She apparently has had a next laceration of same. Chest x-ray negative for pneumonia. She has responded well to nebulizer treatments, IV steroids, BiPAP. Will admit to general medicine.   CRITICAL CARE Performed by: Nat Christen Total critical care time: 30 minutes Critical care time was exclusive of separately billable procedures and treating other patients. Critical care was necessary to treat or prevent imminent or life-threatening deterioration. Critical care was time spent personally by me on the following activities: development of treatment plan with patient and/or surrogate as well as nursing, discussions with consultants, evaluation of patient's response to treatment, examination of patient, obtaining history from patient or surrogate, ordering and performing treatments and interventions, ordering and review of laboratory studies, ordering and review of radiographic studies, pulse oximetry and re-evaluation of patient's condition.  Final Clinical Impressions(s) / ED Diagnoses   Final diagnoses:  COPD exacerbation ([place])    New Prescriptions New Prescriptions   No medications on file     Nat Christen, MD 07/20/17 1329

## 2017-07-20 NOTE — H&P (Signed)
History and Physical  Marie [name] KKX:[id number] DOB: 1935/12/01 DOA: 07/20/2017  PCP: [name], MD  Patient coming from: home  Chief Complaint: short of breath  HPI:  67yow PMH restrictive lung disease, presented with sudden onset of shortness of breath this morning requiring BiPAP in the emergency department. Admitted for acute respiratory failure, presumably secondary to restrictive lung disease.  Patient reports over the last several days had been feeling fairly well, little short of breath. This morning she woke up at the usual time with severe shortness of breath without specific aggravating or alleviating factors until improvement in the emergency department. No fever or other associated symptoms. She uses oxygen intermittently at home and did need to use oxygen last night.  ED Course: Treated with BiPAP, albuterol, Atrovent, normal saline.   Review of Systems:  Negative for fever, visual changes, sore throat, rash, new muscle aches, chest pain, dysuria, bleeding, n/v/abdominal pain.  Past Medical History:  Diagnosis Date  . Balance disorder 2008.     Falls a lot.  She can be standing and then leans too far over to one side   . Bulging disc   . Chronic bronchitis ([place])    due to congestion at times, on prednisone and advair  . COPD (chronic obstructive pulmonary disease) ([place])   . Depression    many years ago  . GERD (gastroesophageal reflux disease)   . Hearing loss    mild  . Hyperlipidemia   . Hypertension   . Iatrogenic adrenal insufficiency ([place])   . Incontinence    not indicated at this visit.  Marland Kitchen LVH (left ventricular hypertrophy) due to hypertensive disease    a. 02/2017: echo showing an EF of 65-70% with moderate LVH, hyperdynamic LV with subvalvular gradient of 70 mmHg, SAM, and mild MR  . Neuropathy    legs stay numb   . Osteoarthritis    hands,   . Osteoporosis   . Shortness of breath dyspnea   . Tubulovillous adenoma of rectum   . Wears  dentures   . Wears glasses     Past Surgical History:  Procedure Laterality Date  . ABDOMINAL HYSTERECTOMY    . APPENDECTOMY    . CHONDROPLASTY  09/19/2014   Procedure: CHONDROPLASTY;  Surgeon: Alta Corning, MD;  Location: [facility];  Service: Orthopedics;;  . DILATION AND CURETTAGE OF UTERUS    . EXTERNAL FIXATION LEG  10/25/2012   Procedure: EXTERNAL FIXATION LEG;  Surgeon: Rozanna Box, MD;  Location: [facility];  Service: Orthopedics;  Laterality: Right;  . EYE SURGERY     bilateral cataract surgery and lens implant  . FINGER SURGERY     fusions and debridements for OA  . INCONTINENCE SURGERY     multiple procedures, not cured  . KNEE ARTHROSCOPY WITH LATERAL MENISECTOMY Right 09/19/2014   Procedure: KNEE ARTHROSCOPY WITH LATERAL MENISECTOMY;  Surgeon: Alta Corning, MD;  Location: [facility];  Service: Orthopedics;  Laterality: Right;  . KNEE ARTHROSCOPY WITH MEDIAL MENISECTOMY Right 09/19/2014   Procedure: RIGHT KNEE ARTHROSCOPY WITH MEDIAL AND LATERAL MENISECTOMIES. CHONDROPLASTY OF PATELLA-FEMORAL JOINT;  Surgeon: Alta Corning, MD;  Location: [facility];  Service: Orthopedics;  Laterality: Right;  . RECTAL BIOPSY  09/21/2011   Procedure: BIOPSY RECTAL;  Surgeon: Merrie Roof, MD;  Location: [facility];  Service: General;  Laterality: N/A;  3-4 cm  . RECTAL SURGERY     by dr. Marlou Starks, removal of  polyp  . TONSILLECTOMY       reports that she has never smoked. She has never used smokeless tobacco. She reports that she does not drink alcohol or use drugs.   Allergies  Allergen Reactions  . Ertapenem Hives    Caused whole body to turn red Other reaction(s): Redness Caused whole body to turn red  . Codeine Other (See Comments)    hallucinations  . Demerol Other (See Comments)    hallucinations  . Morphine And Related Other (See Comments)    hallucinations  . Other Other (See Comments)    Invan 7- pt became red all over  . Sulfate  Other (See Comments)    unknown    Family History  Problem Relation Age of Onset  . Cancer Sister        pt unaware of what kind  . High blood pressure Mother   . COPD Mother   . Heart attack Father      Prior to Admission medications   Medication Sig Start Date End Date Taking? Authorizing Provider  albuterol (PROVENTIL) (2.5 MG/3ML) 0.083% nebulizer solution Use 1 ampule in nebulizer 4 times a day or every 4 hours. 05/13/17  Yes [name], MD  aspirin 81 MG tablet Take 81 mg by mouth daily. Buys OTC   Yes [provider]  budesonide (PULMICORT) 0.5 MG/2ML nebulizer solution Take 2 mLs (0.5 mg total) by nebulization 2 (two) times daily. Dx-J45.909, J44.0, J20.9 05/13/17  Yes [name], MD  Fluticasone-Salmeterol (ADVAIR DISKUS) 250-50 MCG/DOSE AEPB Inhale 1 puff into the lungs 2 (two) times daily. 05/14/17  Yes [name], MD  losartan-hydrochlorothiazide (HYZAAR) 100-25 MG tablet Take 1 tablet by mouth daily.   Yes [provider]  nystatin (MYCOSTATIN) 100000 UNIT/ML suspension Take 5 mLs (500,000 Units total) by mouth 4 (four) times daily. Patient taking differently: Take 5 mLs by mouth 2 (two) times daily.  12/30/16  Yes Rai, Ripudeep K, MD  omeprazole (PRILOSEC) 40 MG capsule Take 1 capsule (40 mg total) by mouth 2 (two) times daily. 12/30/16  Yes Rai, Ripudeep K, MD  pravastatin (PRAVACHOL) 40 MG tablet Take 20 mg by mouth daily.   Yes [provider]  sodium chloride HYPERTONIC 3 % nebulizer solution Take 1 vial by nebulization BID 02/24/17  Yes McQuaid, Ronie Spies, MD  benzonatate (TESSALON) 100 MG capsule TAKE ONE CAPSULE BY MOUTH THREE TIMES DAILY AS NEEDED FOR  COUGH Patient not taking: Reported on 07/20/2017 07/13/17   [name], MD  carbidopa-levodopa (SINEMET IR) 25-100 MG tablet Take 1 tablet by mouth 3 (three) times daily. Patient not taking: Reported on 07/20/2017 03/04/17   Narda Amber K, DO  diltiazem (CARDIZEM) 30 MG  tablet Take 1 tablet (30 mg total) by mouth 2 (two) times daily. Patient not taking: Reported on 07/20/2017 03/29/17   Erma Heritage, PA-C  famotidine (PEPCID) 20 MG tablet Take 1 tablet (20 mg total) by mouth 2 (two) times daily. Patient not taking: Reported on 07/20/2017 03/19/17   Kathie Dike, MD  fluconazole (DIFLUCAN) 150 MG tablet Take 1 tablet weekly if needed for yeast infection Patient not taking: Reported on 07/20/2017 04/26/17   [name], MD  furosemide (LASIX) 40 MG tablet Take 0.5 tablets (20 mg total) by mouth daily as needed for fluid. Patient not taking: Reported on 07/20/2017 03/19/17   Kathie Dike, MD  ipratropium (ATROVENT) 0.02 % nebulizer solution Use 1 ampule in nebulizer 4 times a day or every 4  hours. Patient not taking: Reported on 07/20/2017 05/13/17   [name], MD  potassium chloride (K-DUR) 10 MEQ tablet Take 1 tablet (10 mEq total) by mouth daily. 03/10/17 04/10/17  Roxan Hockey, MD  predniSONE (DELTASONE) 10 MG tablet 1 tablet 3 x/day or as directed Patient not taking: Reported on 07/20/2017 04/12/17 10/12/17  [name], MD    Physical Exam:   18, 81, 74, 153/72, 93% on 4 L. Appears calm, comfortable.  Eyes. Pupils, irises, lids appear unremarkable.  ENT. Grossly normal hearing, lips, tongue.  Neck. No lymphadenopathy or masses. No thyromegaly.  Respiratory. Poor air movement, decreased breath sounds, some inspiratory crackles. No rhonchi or wheezes. Mild increased respiratory effort.  Cardiovascular. Regular rate and rhythm. 3/6 systolic murmur. No rub or gallop. No lower extremity edema.  Abdomen. Soft, nontender, nondistended. No hepatomegaly.  Skin. No rash or induration. Nontender to palpation.  Musculoskeletal. Grossly normal tone and strength all extremities. Digits of the upper extremities appear unremarkable.  Psychiatric. Grossly normal mood and affect. Speech fluent and appropriate.  Wt Readings from Last 3  Encounters:  07/12/17 59.4 kg (131 lb)  06/11/17 60.4 kg (133 lb 3 oz)  05/25/17 61.7 kg (136 lb)    I have personally reviewed following labs and imaging studies  Labs:   Potassium 3.1, remainder BMP unremarkable  Troponin negative  CBC unremarkable  Imaging studies:   Chest x-ray independently reviewed, no acute disease  Medical tests:   EKG independently reviewed, sinus rhythm, no acute changes   Principal Problem:   Acute respiratory failure (HCC) Active Problems:   Restrictive lung disease   Parkinson's plus syndrome (HCC)   COPD with chronic bronchitis ([facility])   Assessment/Plan Acute respiratory failure, presumably secondary to restrictive lung disease, chronic bronchitis. No evidence of infection, no indication for antibiotics at this point. No evidence to suggest cardiac etiology. -Clinically improved, now off BiPAP but still on oxygen. -Plan observation, bronchodilators, steroid, supplemental oxygen. Monitor for worsening.  Restrictive lung disease with acute exacerbation, chronic bronchitis. -Treatment as above.  Parkinson's plus syndrome -Continue Sinemet  Essential hypertension -Stable  Hyperlipidemia -Stable. Continue statin.  Severity of Illness: The appropriate patient status for this patient is OBSERVATION. Observation status is judged to be reasonable and necessary in order to provide the required intensity of service to ensure the patient's safety. The patient's presenting symptoms, physical exam findings, and initial radiographic and laboratory data in the context of their medical condition is felt to place them at decreased risk for further clinical deterioration. Furthermore, it is anticipated that the patient will be medically stable for discharge from the hospital within 2 midnights of admission. The following factors support the patient status of observation.   DVT prophylaxis:SCDs Code Status: full Family Communication: husband at  bedside   Time spent: 78 minutes  Murray Hodgkins, MD  [facility] Direct contact: [phone] --[place]  --[email or web address]; password [email or web address]  7PM-7AM contact night coverage as above  07/20/2017, 1:44 PM

## 2017-07-20 NOTE — ED Notes (Signed)
Pt transitioned off of bipap by RT.  Placed on 4L [place].  Tolerating well.  Sats in low 90s, but pt denies sob.

## 2017-07-21 DIAGNOSIS — J9601 Acute respiratory failure with hypoxia: Secondary | ICD-10-CM | POA: Diagnosis not present

## 2017-07-21 DIAGNOSIS — J441 Chronic obstructive pulmonary disease with (acute) exacerbation: Secondary | ICD-10-CM | POA: Diagnosis not present

## 2017-07-21 MED ORDER — IPRATROPIUM-ALBUTEROL 0.5-2.5 (3) MG/3ML IN SOLN: 3.0000 mL | Freq: Three times a day (TID) | RESPIRATORY_TRACT | Status: DC

## 2017-07-21 NOTE — Progress Notes (Signed)
Patient being d/c home with husband. IV cath removed and intact. No c/o pain at this time or at site. Verbalized understanding of instructions. Patient sat in chair today and tolerated it well.

## 2017-07-21 NOTE — Discharge Summary (Signed)
Physician Discharge Summary  Marie [name] POE:[id number] DOB: 03/17/1936 DOA: 07/20/2017  PCP: Unk Marie [name]  Admit date: 07/20/2017 Discharge date: 07/21/2017  Time spent: 45 minutes  Recommendations for Outpatient Follow-up:  -To be discharged home today. -Advised to follow-up with PCP in 2 weeks.   Discharge Diagnoses:  Principal Problem:   Acute respiratory failure ([name]) Active Problems:   Restrictive lung disease   Parkinson's plus syndrome ([name])   COPD with chronic bronchitis ([name])   Discharge Condition: Stable and improved  Filed Weights   07/20/17 1514  Weight: 58.5 kg (129 lb)    History of present illness:  As per Dr. [name] on 10/2: 61yow PMH restrictive lung disease, presented with sudden onset of shortness of breath this morning requiring BiPAP in the emergency department. Admitted for acute respiratory failure, presumably secondary to restrictive lung disease.  Patient reports over the last several days had been feeling fairly well, little short of breath. This morning she woke up at the usual time with severe shortness of breath without specific aggravating or alleviating factors until improvement in the emergency department. No fever or other associated symptoms. She uses oxygen intermittently at home and did need to use oxygen last night.  ED Course: Treated with BiPAP, albuterol, Atrovent, normal saline.   Hospital Course:   Acute hypoxemic respiratory failure -Has history of restrictive lung disease and chronic bronchitis and suspect mainly secondary to this. -She has had no evidence of infection, hence no antibiotics have been prescribed, there is no evidence to suggest cardiac etiology. -To continue bronchodilators, do not believe steroids are necessary at this point.  Hyperlipidemia -Continue statin  Essential hypertension -Well-controlled  Parkinson's -Continue Sinemet  Procedures:  None   Consultations: None    Discharge Instructions  Discharge Instructions    Diet - low sodium heart healthy    Complete by:  As directed    Increase activity slowly    Complete by:  As directed      Allergies as of 07/21/2017      Reactions   Ertapenem Hives   Caused whole body to turn red Other reaction(s): Redness Caused whole body to turn red   Codeine Other (See Comments)   hallucinations   Demerol Other (See Comments)   hallucinations   Morphine And Related Other (See Comments)   hallucinations   Other Other (See Comments)   Invan 7- pt became red all over   Sulfate Other (See Comments)   unknown      Medication List    STOP taking these medications   carbidopa-levodopa 25-100 MG tablet Commonly known as:  SINEMET IR   diltiazem 30 MG tablet Commonly known as:  CARDIZEM   famotidine 20 MG tablet Commonly known as:  PEPCID   fluconazole 150 MG tablet Commonly known as:  DIFLUCAN   furosemide 40 MG tablet Commonly known as:  LASIX   ipratropium 0.02 % nebulizer solution Commonly known as:  ATROVENT   potassium chloride 10 MEQ tablet Commonly known as:  K-DUR   predniSONE 10 MG tablet Commonly known as:  DELTASONE     TAKE these medications   albuterol (2.5 MG/3ML) 0.083% nebulizer solution Commonly known as:  PROVENTIL Use 1 ampule in nebulizer 4 times a day or every 4 hours.   aspirin 81 MG tablet Take 81 mg by mouth daily. Buys OTC   benzonatate 100 MG capsule Commonly known as:  TESSALON TAKE ONE CAPSULE BY MOUTH THREE TIMES DAILY AS  NEEDED FOR  COUGH   budesonide 0.5 MG/2ML nebulizer solution Commonly known as:  PULMICORT Take 2 mLs (0.5 mg total) by nebulization 2 (two) times daily. Dx-J45.909, J44.0, J20.9   Fluticasone-Salmeterol 250-50 MCG/DOSE Aepb Commonly known as:  ADVAIR DISKUS Inhale 1 puff into the lungs 2 (two) times daily.   losartan-hydrochlorothiazide 100-25 MG tablet Commonly known as:  HYZAAR Take 1 tablet by mouth daily.   nystatin  100000 UNIT/ML suspension Commonly known as:  MYCOSTATIN Take 5 mLs (500,000 Units total) by mouth 4 (four) times daily. What changed:  when to take this   omeprazole 40 MG capsule Commonly known as:  PRILOSEC Take 1 capsule (40 mg total) by mouth 2 (two) times daily.   pravastatin 40 MG tablet Commonly known as:  PRAVACHOL Take 20 mg by mouth daily.   sodium chloride HYPERTONIC 3 % nebulizer solution Take 1 vial by nebulization BID      Allergies  Allergen Reactions  . Ertapenem Hives    Caused whole body to turn red Other reaction(s): Redness Caused whole body to turn red  . Codeine Other (See Comments)    hallucinations  . Demerol Other (See Comments)    hallucinations  . Morphine And Related Other (See Comments)    hallucinations  . Other Other (See Comments)    Invan 7- pt became red all over  . Sulfate Other (See Comments)    unknown   Follow-up Information    Unk Marie [name]. Schedule an appointment as soon as possible for a visit in 2 week(s).   Specialty:  Internal Medicine Contact information: [address] [place] [address] [phone]            The results of significant diagnostics from this hospitalization (including imaging, microbiology, ancillary and laboratory) are listed below for reference.    Significant Diagnostic Studies: Dg Chest Port 1 View  Result Date: 07/20/2017 CLINICAL DATA:  shortness of breath. EXAM: PORTABLE CHEST 1 VIEW COMPARISON:  04/10/2017 FINDINGS: The heart size and mediastinal contours are within normal limits. Both lungs are clear. The visualized skeletal structures are unremarkable. IMPRESSION: No active disease. Electronically Signed   By: Kerby Moors M.D.   On: 07/20/2017 10:59    Microbiology: No results found for this or any previous visit (from the past 240 hour(s)).   Labs: Basic Metabolic Panel:  Recent Labs Lab 07/20/17 1050  NA 143  K 3.1*  CL 106  CO2 29  GLUCOSE  93  BUN 17  CREATININE 1.04*  CALCIUM 9.1   Liver Function Tests: No results for input(s): AST, ALT, ALKPHOS, BILITOT, PROT, ALBUMIN in the last 168 hours. No results for input(s): LIPASE, AMYLASE in the last 168 hours. No results for input(s): AMMONIA in the last 168 hours. CBC:  Recent Labs Lab 07/20/17 1050  WBC 6.6  NEUTROABS 2.4  HGB 15.5*  HCT 47.8*  MCV 91.4  PLT 203   Cardiac Enzymes:  Recent Labs Lab 07/20/17 1050  TROPONINI <0.03   BNP: BNP (last 3 results)  Recent Labs  03/09/17 0834  BNP 109.0*    ProBNP (last 3 results) No results for input(s): PROBNP in the last 8760 hours.  CBG: No results for input(s): GLUCAP in the last 168 hours.     SignedLelon Frohlich  [facility] Pager: [phone] 07/21/2017, 6:25 PM

## 2017-07-21 NOTE — Care Management Note (Signed)
Case Management Note  Patient Details  Name: Marie [name] MRN: [id number] Date of Birth: [birth year]  Subjective/Objective:   Adm with acute respiratory failure. From home with husband. Has home oxygen, unsure of provider. Walks with a RW. No longer requiring BiPap, on 2 L O2. Has PCP, husband drives him to appointments, she reports she could drive herself. Has insurance, reports no issues affording medications.             Action/Plan: Anticipate DC home with self care. No CM needs communicated.    Expected Discharge Date:    07/22/2017            Expected Discharge Plan:  Home/Self Care  In-House Referral:     Discharge planning Services  CM Consult  Post Acute Care Choice:  NA Choice offered to:  NA  DME Arranged:    DME Agency:     HH Arranged:    HH Agency:     Status of Service:  Completed, signed off  If discussed at [employer] of Stay Meetings, dates discussed:    Additional Comments:  Daiwik Buffalo, Chauncey Reading, RN 07/21/2017, 9:50 AM

## 2017-07-21 NOTE — Care Management Obs Status (Signed)
[place] NOTIFICATION   Patient Details  Name: ALEAHA FICKLING MRN: [id number] Date of Birth: [birth year]   Medicare Observation Status Notification Given:  Yes    Naquita Nappier, Chauncey Reading, RN 07/21/2017, 9:39 AM

## 2017-07-25 NOTE — Progress Notes (Signed)
Hospital follow up  Assessment and Plan: Hospital visit follow up for acute respiratory failure secondary to restrictive airway disease Hospital discharge meds were reviewed, and reconciled with the patient.   Marie [name] was seen today for follow-up.  Diagnoses and all orders for this visit:  Acute respiratory failure with hypoxia ([place]), Restrictive lung disease, COPD with chronic bronchitis ([place])       -     Reviewed medications/O2 and appropriate use at length with both patient and her husband who helps manage her medications. Suspect poor adherence and understanding of appropriate use are components of frequent ER use.  Parkinson's plus syndrome ([place])      -      Instructed to restart sinemet   Oropharyngeal dysphagia      -      Patient denies recent choking/suspected aspiration episodes      -      Discussed possible need for OT should she have more issues  [place] -     nystatin (MYCOSTATIN) 100000 UNIT/ML suspension; Take 5 mLs (500,000 Units total) by mouth 2 (two) times daily. -     Educated on rinsing out mouth after use of inhaled steroids   Several medications were discontinued after discharge; recommend staying on sinemet per neurologist's last note. Otherwise will have her check BP routinely and follow up soon as scheduled with Dr. Melford Aase.   Over 40 minutes of exam, counseling, chart review, and complex, high/moderate level critical decision making was performed this visit.   Future Appointments Date Time Provider [place]  09/01/2017 10:30 AM [name], MD [facility] None  10/04/2017 11:00 AM Narda Amber K, DO [facility] None  03/10/2018 10:00 AM [name], MD [facility] None      HPI BP 116/60   Pulse 80   Temp 98 F (36.7 C)   Resp 18   Ht 5\' 3"  (1.6 m)   Wt 125 lb 12.8 oz (57.1 kg)   SpO2 97%   BMI 22.28 kg/m   81 y.o.female presents accompanied by her husband for follow up for transition from recent hospitalization stay. She  reports she is now well, denies SOB/CP/ any other concerns. Admit date to the hospital was 07/20/17, patient was discharged from the hospital on 07/21/17 and our clinical staff contacted the office the day after discharge to set up a follow up appointment. The discharge summary, medications, and diagnostic test results were reviewed before meeting with the patient. The patient was admitted for: acute respiratory failure secondary to restrictive airway disease   Course of stay: presented to ED with sudden onset of shortness of breath this morning requiring BiPAPt. Admitted for acute respiratory failure, presumably secondary to restrictive lung disease. Patient had been feeling feeling fairly well,  Though slightly short of breath prior to admission. That morning she woke up at the usual time with severe shortness of breath without specific aggravating or alleviating factors until improvement in the emergency department. No fever or other associated symptoms.   The patient has numerous diagnoses of respiratory conditions including Restrictive lung disease, COPD with chronic bronchitis ([place]) and asthma, with 5 ER visits in the past year including this episode, with 4 consequent admissions relating to COPD exacerbations/hypoxia. The patient is followed by pulmonologist Dr. Lake Bells who saw her most recently 07/12/2017, and noted little evidence of an ongoing lung disease that we need to be concerned about with the exception of some restriction secondary to neuromuscular weakness and notes since she has started treatment for  her Parkinson syndrome she has improved greatly. She is followed by Dr. 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 She is also monitored by cardiology seen most recently 03/29/2017- notes she is well managed with ECHO demonstrating  preserved EF of 65-70% with moderate LVH, hyperdynamic LV with subvalvular gradient of 70 mmHg, SAM, and mild MR.   Images while in the hospital: CXR 07/20/2017 CLINICAL DATA:  shortness of breath. EXAM: PORTABLE CHEST 1 VIEW COMPARISON:  04/10/2017 FINDINGS: The heart size and mediastinal contours are within normal limits. Both lungs are clear. The visualized skeletal structures are unremarkable. IMPRESSION: No active disease. Electronically Signed   By: Kerby Moors M.D.   On: 07/20/2017 10:59   Home health is not involved.    Past Medical History:  Diagnosis Date  . Balance disorder 2008.     Falls a lot.  She can be standing and then leans too far over to one side   . Bulging disc   . Chronic bronchitis ([place])    due to congestion at times, on prednisone and advair  . COPD (chronic obstructive pulmonary disease) ([place])   . Depression    many years ago  . GERD (gastroesophageal reflux disease)   . Hearing loss    mild  . Hyperlipidemia   . Hypertension   . Iatrogenic adrenal insufficiency ([place])   . Incontinence    not indicated at this visit.  Marland Kitchen LVH (left ventricular hypertrophy) due to hypertensive disease    a. 02/2017: echo showing an EF of 65-70% with moderate LVH, hyperdynamic LV with subvalvular gradient of 70 mmHg, SAM, and mild MR  . Neuropathy    legs stay numb   . Osteoarthritis    hands,   . Osteoporosis   . Shortness of breath dyspnea   . Tubulovillous adenoma of rectum   . Wears dentures   . Wears glasses      Allergies  Allergen Reactions  . Ertapenem Hives    Caused whole body to turn red Other reaction(s): Redness Caused whole body to turn red  . Codeine Other (See Comments)    hallucinations  . Demerol Other (See Comments)    hallucinations  . Morphine And Related Other (See Comments)    hallucinations  . Other Other (See Comments)    Invan 7- pt became red all over  . Sulfate Other (See Comments)    unknown      Current Outpatient  Prescriptions on File Prior to Visit  Medication Sig Dispense Refill  . albuterol (PROVENTIL) (2.5 MG/3ML) 0.083% nebulizer solution Use 1 ampule in nebulizer 4 times a day or every 4 hours. 360 mL 1  . aspirin 81 MG tablet Take 81 mg by mouth daily. Buys OTC    . benzonatate (TESSALON) 100 MG capsule TAKE ONE CAPSULE BY MOUTH THREE TIMES DAILY AS NEEDED FOR  COUGH 90 capsule 3  . budesonide (PULMICORT) 0.5 MG/2ML nebulizer solution Take 2 mLs (0.5 mg total) by nebulization 2 (two) times daily. Dx-J45.909, J44.0, J20.9 180 mL 1  . Fluticasone-Salmeterol (ADVAIR DISKUS) 250-50 MCG/DOSE AEPB Inhale 1 puff into the lungs 2 (two) times daily. 180 each 3  . losartan-hydrochlorothiazide (HYZAAR) 100-25 MG tablet Take 1 tablet by mouth daily.    Marland Kitchen omeprazole (PRILOSEC) 40 MG capsule Take 1 capsule (40 mg total) by mouth 2 (  two) times daily. 60 capsule 4  . pravastatin (PRAVACHOL) 40 MG tablet Take 20 mg by mouth daily.    . sodium chloride HYPERTONIC 3 % nebulizer solution Take 1 vial by nebulization BID 240 mL 11   No current facility-administered medications on file prior to visit.     ROS: all negative except above.   Physical Exam: Filed Weights   07/26/17 1435  Weight: 125 lb 12.8 oz (57.1 kg)   BP 116/60   Pulse 80   Temp 98 F (36.7 C)   Resp 18   Ht 5\' 3"  (1.6 m)   Wt 125 lb 12.8 oz (57.1 kg)   SpO2 97%   BMI 22.28 kg/m  General Appearance: Well nourished, in no apparent distress. Eyes: PERRLA, EOMs, conjunctiva no swelling or erythema Sinuses: No Frontal/maxillary tenderness ENT/Mouth: Ext aud canals clear, TMs without erythema, bulging. No erythema, swelling, or exudate on post pharynx.  Tonsils not swollen or erythematous. Hearing normal.  Neck: Supple, thyroid normal.  Respiratory: Respiratory effort normal, BS equal bilaterally without rales, rhonchi, or stridor.  Mild end expiratory wheezing through L bronchial areas.  Cardio: RRR with no MRGs. Brisk peripheral pulses  without edema.  Abdomen: Soft, + BS.  Non tender, no guarding, rebound, hernias, masses. Lymphatics: Non tender without lymphadenopathy.  Musculoskeletal: slow, shuffling gait with walker,  Skin: Warm, dry without rashes, lesions, ecchymosis.  Neuro: Normal muscle tone, no cerebellar symptoms. Sensation intact. Mild intermittent tremor to L hand.  Psych: Awake and oriented X 3, normal affect, slow speech, Insight and Judgment appropriate.     Izora Ribas, NP 6:32 PM [name]

## 2017-07-26 ENCOUNTER — Ambulatory Visit (INDEPENDENT_AMBULATORY_CARE_PROVIDER_SITE_OTHER): Payer: Medicare Other | Admitting: Adult Health

## 2017-07-26 ENCOUNTER — Encounter: Payer: Self-pay | Admitting: Adult Health

## 2017-07-26 VITALS — BP 116/60 | HR 80 | Temp 98.0°F | Resp 18 | Ht 63.0 in | Wt 125.8 lb

## 2017-07-26 DIAGNOSIS — G232 Striatonigral degeneration: Secondary | ICD-10-CM

## 2017-07-26 DIAGNOSIS — J9601 Acute respiratory failure with hypoxia: Secondary | ICD-10-CM

## 2017-07-26 DIAGNOSIS — J4489 Other specified chronic obstructive pulmonary disease: Secondary | ICD-10-CM

## 2017-07-26 DIAGNOSIS — G20C Parkinsonism, unspecified: Secondary | ICD-10-CM

## 2017-07-26 DIAGNOSIS — J984 Other disorders of lung: Secondary | ICD-10-CM

## 2017-07-26 DIAGNOSIS — R1312 Dysphagia, oropharyngeal phase: Secondary | ICD-10-CM

## 2017-07-26 DIAGNOSIS — J449 Chronic obstructive pulmonary disease, unspecified: Secondary | ICD-10-CM

## 2017-07-26 DIAGNOSIS — B370 Candidal stomatitis: Secondary | ICD-10-CM | POA: Diagnosis not present

## 2017-07-26 MED ORDER — NYSTATIN 100000 UNIT/ML MT SUSP: 5.0000 mL | Freq: Two times a day (BID) | OROMUCOSAL | 2 refills | Status: DC

## 2017-07-26 NOTE — Patient Instructions (Signed)
Shortness of Breath, Adult  Shortness of breath means you have trouble breathing. Your lungs are organs for breathing.  Follow these instructions at home:  Pay attention to any changes in your symptoms. Take these actions to help with your condition:  ? Do not smoke. Smoking can cause shortness of breath. If you need help to quit smoking, ask your doctor.  ? Avoid things that can make it harder to breathe, such as:  ? Mold.  ? Dust.  ? Air pollution.  ? Chemical smells.  ? Things that can cause allergy symptoms (allergens), if you have allergies.  ? Keep your living space clean and free of mold and dust.  ? Rest as needed. Slowly return to your usual activities.  ? Take over-the-counter and prescription medicines, including oxygen and inhaled medicines, only as told by your doctor.  ? Keep all follow-up visits as told by your doctor. This is important.  Contact a doctor if:  ? Your condition does not get better as soon as expected.  ? You have a hard time doing your normal activities, even after you rest.  ? You have new symptoms.  Get help right away if:  ? You have trouble breathing when you are resting.  ? You feel light-headed or you faint.  ? You have a cough that is not helped by medicines.  ? You cough up blood.  ? You have pain with breathing.  ? You have pain in your chest, arms, shoulders, or belly (abdomen).  ? You have a fever.  ? You cannot walk up stairs.  ? You cannot exercise the way you normally do.  This information is not intended to replace advice given to you by your health care provider. Make sure you discuss any questions you have with your health care provider.  Document Released: 03/23/2008 Document Revised: 10/22/2016 Document Reviewed: 10/22/2016  Elsevier Interactive Patient Education ? 2017 [employer]

## 2017-08-05 ENCOUNTER — Encounter (HOSPITAL_COMMUNITY): Payer: Self-pay | Admitting: *Deleted

## 2017-08-05 ENCOUNTER — Emergency Department (HOSPITAL_COMMUNITY): Payer: Medicare Other

## 2017-08-05 ENCOUNTER — Emergency Department (HOSPITAL_COMMUNITY)
Admission: EM | Admit: 2017-08-05 | Discharge: 2017-08-05 | Disposition: A | Discharge status: Home / Self Care | Payer: Medicare Other | Attending: Emergency Medicine | Admitting: Emergency Medicine

## 2017-08-05 DIAGNOSIS — J441 Chronic obstructive pulmonary disease with (acute) exacerbation: Secondary | ICD-10-CM | POA: Diagnosis not present

## 2017-08-05 DIAGNOSIS — Z79899 Other long term (current) drug therapy: Secondary | ICD-10-CM | POA: Diagnosis not present

## 2017-08-05 DIAGNOSIS — R062 Wheezing: Secondary | ICD-10-CM | POA: Diagnosis not present

## 2017-08-05 DIAGNOSIS — G20 Parkinson's disease: Secondary | ICD-10-CM | POA: Insufficient documentation

## 2017-08-05 DIAGNOSIS — R0682 Tachypnea, not elsewhere classified: Secondary | ICD-10-CM | POA: Diagnosis not present

## 2017-08-05 DIAGNOSIS — Z7982 Long term (current) use of aspirin: Secondary | ICD-10-CM | POA: Insufficient documentation

## 2017-08-05 DIAGNOSIS — R0602 Shortness of breath: Secondary | ICD-10-CM | POA: Diagnosis not present

## 2017-08-05 LAB — CBC
HCT: 48.5 % — ABNORMAL HIGH (ref 36.0–46.0)
HEMOGLOBIN: 15.5 g/dL — AB (ref 12.0–15.0)
MCH: 29.5 pg (ref 26.0–34.0)
MCHC: 32 g/dL (ref 30.0–36.0)
MCV: 92.4 fL (ref 78.0–100.0)
PLATELETS: 210 10*3/uL (ref 150–400)
RBC: 5.25 MIL/uL — AB (ref 3.87–5.11)
RDW: 13.4 % (ref 11.5–15.5)
WBC: 11 10*3/uL — ABNORMAL HIGH (ref 4.0–10.5)

## 2017-08-05 LAB — DIFFERENTIAL
Basophils Absolute: 0.1 10*3/uL (ref 0.0–0.1)
Basophils Relative: 1 %
EOS PCT: 9 %
Eosinophils Absolute: 0.9 10*3/uL — ABNORMAL HIGH (ref 0.0–0.7)
LYMPHS ABS: 3.6 10*3/uL (ref 0.7–4.0)
LYMPHS PCT: 33 %
MONOS PCT: 6 %
Monocytes Absolute: 0.7 10*3/uL (ref 0.1–1.0)
Neutro Abs: 5.7 10*3/uL (ref 1.7–7.7)
Neutrophils Relative %: 51 %

## 2017-08-05 LAB — BASIC METABOLIC PANEL
Anion gap: 9 (ref 5–15)
BUN: 19 mg/dL (ref 6–20)
CHLORIDE: 106 mmol/L (ref 101–111)
CO2: 27 mmol/L (ref 22–32)
CREATININE: 0.82 mg/dL (ref 0.44–1.00)
Calcium: 8.9 mg/dL (ref 8.9–10.3)
GFR calc non Af Amer: >60 mL/min (ref >60)
Glucose, Bld: 98 mg/dL (ref 65–99)
Potassium: 4.1 mmol/L (ref 3.5–5.1)
Sodium: 142 mmol/L (ref 135–145)

## 2017-08-05 LAB — HEPATIC FUNCTION PANEL
ALBUMIN: 3.8 g/dL (ref 3.5–5.0)
ALK PHOS: 57 U/L (ref 38–126)
ALT: 5 U/L — ABNORMAL LOW (ref 14–54)
AST: 21 U/L (ref 15–41)
BILIRUBIN TOTAL: 0.8 mg/dL (ref 0.3–1.2)
Bilirubin, Direct: 0.1 mg/dL (ref 0.1–0.5)
Indirect Bilirubin: 0.7 mg/dL (ref 0.3–0.9)
Total Protein: 6.3 g/dL — ABNORMAL LOW (ref 6.5–8.1)

## 2017-08-05 LAB — BRAIN NATRIURETIC PEPTIDE: B Natriuretic Peptide: 153 pg/mL — ABNORMAL HIGH (ref 0.0–100.0)

## 2017-08-05 MED ORDER — MAGNESIUM SULFATE 2 GM/50ML IV SOLN
2.0000 g | Freq: Once | INTRAVENOUS | Status: AC
  Administered 2017-08-05: 2 g via INTRAVENOUS
  Filled 2017-08-05: qty 50

## 2017-08-05 MED ORDER — IPRATROPIUM-ALBUTEROL 0.5-2.5 (3) MG/3ML IN SOLN
3.0000 mL | Freq: Once | RESPIRATORY_TRACT | Status: AC
  Administered 2017-08-05: 3 mL via RESPIRATORY_TRACT
  Filled 2017-08-05: qty 3

## 2017-08-05 MED ORDER — ALBUTEROL SULFATE (2.5 MG/3ML) 0.083% IN NEBU
2.5000 mg | INHALATION_SOLUTION | Freq: Once | RESPIRATORY_TRACT | Status: AC
  Administered 2017-08-05: 2.5 mg via RESPIRATORY_TRACT
  Filled 2017-08-05: qty 3

## 2017-08-05 MED ORDER — LEVOFLOXACIN 500 MG PO TABS: 500.0000 mg | ORAL_TABLET | Freq: Every day | ORAL | 0 refills | Status: DC

## 2017-08-05 MED ORDER — METHYLPREDNISOLONE SODIUM SUCC 125 MG IJ SOLR
125.0000 mg | Freq: Once | INTRAMUSCULAR | Status: AC
  Administered 2017-08-05: 125 mg via INTRAVENOUS
  Filled 2017-08-05: qty 2

## 2017-08-05 NOTE — ED Triage Notes (Signed)
Pt brought in by [facility] with c/o SOB that started last night. Pt has hx of COPD. Pt arrives to ED on CPAP with Duoneb given by EMS. Pt normally wears 2L O2 via [place].

## 2017-08-05 NOTE — ED Notes (Signed)
Pt taken off CPAP per EDP and placed on O2 at 3L via [place]. O2 sat 95%.

## 2017-08-05 NOTE — Discharge Instructions (Signed)
Increase your prednisone to 30 mg a day and follow-up with your doctor Monday or Tuesday next week. Return if problems

## 2017-08-05 NOTE — ED Provider Notes (Signed)
[facility] EMERGENCY DEPARTMENT Provider Note   CSN: [id number] Arrival date & time: 08/05/17  6720     History   Chief Complaint Chief Complaint  Patient presents with  . Shortness of Breath    HPI Marie [name] is a 81 y.o. female.  patient complains of shortness of breath and cough. Patient with history of COPD and is on chronic prednisone   The history is provided by the patient. No language interpreter was used.  Shortness of Breath  This is a new problem. The problem occurs frequently.The current episode started 12 to 24 hours ago. The problem has not changed since onset.Pertinent negatives include no fever, no headaches, no cough, no chest pain, no abdominal pain and no rash. It is unknown what precipitated the problem. Risk factors: severe COPD. She has tried ipratropium inhalers for the symptoms. The treatment provided mild relief. She has had prior hospitalizations. She has had prior ED visits. She has had prior ICU admissions. Associated medical issues include COPD.    Past Medical History:  Diagnosis Date  . Balance disorder 2008.     Falls a lot.  She can be standing and then leans too far over to one side   . Bulging disc   . Chronic bronchitis ([place])    due to congestion at times, on prednisone and advair  . COPD (chronic obstructive pulmonary disease) ([place])   . Depression    many years ago  . GERD (gastroesophageal reflux disease)   . Hearing loss    mild  . Hyperlipidemia   . Hypertension   . Iatrogenic adrenal insufficiency ([place])   . Incontinence    not indicated at this visit.  Marland Kitchen LVH (left ventricular hypertrophy) due to hypertensive disease    a. 02/2017: echo showing an EF of 65-70% with moderate LVH, hyperdynamic LV with subvalvular gradient of 70 mmHg, SAM, and mild MR  . Neuropathy    legs stay numb   . Osteoarthritis    hands,   . Osteoporosis   . Shortness of breath dyspnea   . Tubulovillous adenoma of rectum   . Wears dentures   .  Wears glasses     Patient Active Problem List   Diagnosis Date Noted  . COPD with chronic bronchitis ([place]) 07/20/2017  . Hypertensive heart disease 03/29/2017  . Protein-calorie malnutrition, severe 03/17/2017  . 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 Restrictive lung disease 02/24/2017  . Dysphagia 01/12/2017  . Murmur 01/07/2017  . LVH (left ventricular hypertrophy) 01/07/2017  . Essential hypertension 12/28/2016  . Depression, major, in remission ([place]) 09/22/2016  . At high risk for falls 10/16/2015  . Hypotension   . Prediabetes 08/23/2014  . Vitamin D deficiency 08/23/2014  . Medication management 08/23/2014  . Unstable Gait 01/30/2014  . Incontinence   . Osteoarthritis   . Hyperlipidemia   . GERD (gastroesophageal reflux disease)   . Hereditary and idiopathic peripheral neuropathy   . Iatrogenic adrenal insufficiency ([place])   . Tubulovillous adenoma of rectum 09/04/2011    Past Surgical History:  Procedure Laterality Date  . ABDOMINAL HYSTERECTOMY    . APPENDECTOMY    . CHONDROPLASTY  09/19/2014   Procedure: CHONDROPLASTY;  Surgeon: Alta Corning, MD;  Location: [place];  Service: Orthopedics;;  . DILATION AND CURETTAGE OF UTERUS    . EXTERNAL FIXATION LEG  10/25/2012   Procedure: EXTERNAL FIXATION LEG;  Surgeon: Rozanna Box, MD;  Location: [place];  Service: Orthopedics;  Laterality: Right;  . EYE SURGERY     bilateral cataract surgery and lens implant  . FINGER SURGERY     fusions and debridements for OA  . INCONTINENCE SURGERY     multiple procedures, not cured  . KNEE ARTHROSCOPY WITH LATERAL MENISECTOMY Right 09/19/2014   Procedure: KNEE ARTHROSCOPY WITH LATERAL MENISECTOMY;  Surgeon: Alta Corning, MD;  Location: [place];  Service: Orthopedics;  Laterality: Right;  . KNEE ARTHROSCOPY WITH MEDIAL MENISECTOMY Right 09/19/2014   Procedure: RIGHT KNEE ARTHROSCOPY WITH MEDIAL AND LATERAL MENISECTOMIES. CHONDROPLASTY OF  PATELLA-FEMORAL JOINT;  Surgeon: Alta Corning, MD;  Location: [place];  Service: Orthopedics;  Laterality: Right;  . RECTAL BIOPSY  09/21/2011   Procedure: BIOPSY RECTAL;  Surgeon: Merrie Roof, MD;  Location: [place];  Service: General;  Laterality: N/A;  3-4 cm  . RECTAL SURGERY     by dr. Marlou Starks, removal of polyp  . TONSILLECTOMY      OB History    No data available       Home Medications    Prior to Admission medications   Medication Sig Start Date End Date Taking? Authorizing Provider  albuterol (PROVENTIL) (2.5 MG/3ML) 0.083% nebulizer solution Use 1 ampule in nebulizer 4 times a day or every 4 hours. 05/13/17  Yes [name], MD  aspirin 81 MG tablet Take 81 mg by mouth daily. Buys OTC   Yes [provider]  benzonatate (TESSALON) 100 MG capsule TAKE ONE CAPSULE BY MOUTH THREE TIMES DAILY AS NEEDED FOR  COUGH 07/13/17  Yes [name], MD  budesonide (PULMICORT) 0.5 MG/2ML nebulizer solution Take 2 mLs (0.5 mg total) by nebulization 2 (two) times daily. Dx-J45.909, J44.0, J20.9 05/13/17  Yes [name], MD  Fluticasone-Salmeterol (ADVAIR DISKUS) 250-50 MCG/DOSE AEPB Inhale 1 puff into the lungs 2 (two) times daily. 05/14/17  Yes [name], MD  losartan-hydrochlorothiazide (HYZAAR) 100-25 MG tablet Take 1 tablet by mouth daily.   Yes [provider]  omeprazole (PRILOSEC) 40 MG capsule Take 1 capsule (40 mg total) by mouth 2 (two) times daily. 12/30/16  Yes Rai, Ripudeep K, MD  pravastatin (PRAVACHOL) 40 MG tablet Take 20 mg by mouth daily.   Yes [provider]  sodium chloride HYPERTONIC 3 % nebulizer solution Take 1 vial by nebulization BID 02/24/17  Yes McQuaid, Ronie Spies, MD  levofloxacin (LEVAQUIN) 500 MG tablet Take 1 tablet (500 mg total) by mouth daily. 08/05/17   Milton Ferguson, MD  nystatin (MYCOSTATIN) 100000 UNIT/ML suspension Take 5 mLs (500,000 Units total) by mouth 2 (two) times daily. 07/26/17   Liane Comber, NP    Family History Family History  Problem Relation Age of Onset  . Cancer Sister        pt unaware of what kind  . High blood pressure Mother   . COPD Mother   . Heart attack Father     Social History Social History  Substance Use Topics  . Smoking status: Never Smoker  . Smokeless tobacco: Never Used  . Alcohol use No     Allergies   Ertapenem; Codeine; Demerol; Morphine and related; Other; and Sulfate   Review of Systems Review of Systems  Constitutional: Negative for appetite change, fatigue and fever.  HENT: Negative for congestion, ear discharge and sinus pressure.   Eyes: Negative for discharge.  Respiratory: Positive for shortness of breath. Negative for cough.   Cardiovascular: Negative for chest pain.  Gastrointestinal: Negative  for abdominal pain and diarrhea.  Genitourinary: Negative for frequency and hematuria.  Musculoskeletal: Negative for back pain.  Skin: Negative for rash.  Neurological: Negative for seizures and headaches.  Psychiatric/Behavioral: Negative for hallucinations.     Physical Exam Updated Vital Signs BP (!) 143/65   Pulse 82   Temp (!) 97.2 F (36.2 C) (Axillary)   Resp 19   Ht 5\' 3"  (1.6 m)   Wt 56.7 kg (125 lb)   SpO2 99%   BMI 22.14 kg/m   Physical Exam  Constitutional: She is oriented to person, place, and time. She appears well-developed.  HENT:  Head: Normocephalic.  Eyes: Conjunctivae and EOM are normal. No scleral icterus.  Neck: Neck supple. No thyromegaly present.  Cardiovascular: Normal rate and regular rhythm.  Exam reveals no gallop and no friction rub.   No murmur heard. Pulmonary/Chest: No stridor. She has wheezes. She has no rales. She exhibits no tenderness.  Abdominal: She exhibits no distension. There is no tenderness. There is no rebound.  Musculoskeletal: Normal range of motion. She exhibits no edema.  Lymphadenopathy:    She has no cervical adenopathy.  Neurological: She is oriented to  person, place, and time. She exhibits normal muscle tone. Coordination normal.  Skin: No rash noted. No erythema.  Psychiatric: She has a normal mood and affect. Her behavior is normal.     ED Treatments / Results  Labs (all labs ordered are listed, but only abnormal results are displayed) Labs Reviewed  CBC - Abnormal; Notable for the following:       Result Value   WBC 11.0 (*)    RBC 5.25 (*)    Hemoglobin 15.5 (*)    HCT 48.5 (*)    All other components within normal limits  BRAIN NATRIURETIC PEPTIDE - Abnormal; Notable for the following:    B Natriuretic Peptide 153.0 (*)    All other components within normal limits  DIFFERENTIAL - Abnormal; Notable for the following:    Eosinophils Absolute 0.9 (*)    All other components within normal limits  HEPATIC FUNCTION PANEL - Abnormal; Notable for the following:    Total Protein 6.3 (*)    ALT 5 (*)    All other components within normal limits  BASIC METABOLIC PANEL    EKG  EKG Interpretation  Date/Time:  Thursday August 05 2017 08:57:58 EDT Ventricular Rate:  87 PR Interval:    QRS Duration: 91 QT Interval:  406 QTC Calculation: 489 R Axis:   66 Text Interpretation:  Sinus rhythm Consider anterior infarct Nonspecific repol abnormality, lateral leads Confirmed by Milton Ferguson [phone]) on 08/05/2017 12:04:20 PM       Radiology Dg Chest Portable 1 View  Result Date: 08/05/2017 CLINICAL DATA:  Pt brought in by [facility] with c/o SOB that started last night. Pt has hx of COPD. Pt arrives to ED on CPAP with Duoneb given by EMS. Pt normally wears 2L O2 via [place]. EXAM: PORTABLE CHEST - 1 VIEW COMPARISON:  07/20/2017 FINDINGS: No focal infiltrate or overt edema. Prominent perihilar bronchovascular markings as before. Heart size and mediastinal contours are within normal limits. Atheromatous aorta. No effusion.  No pneumothorax. Visualized bones unremarkable. IMPRESSION: Chronic perihilar changes.  No acute cardiopulmonary disease.  Electronically Signed   By: Lucrezia Europe M.D.   On: 08/05/2017 09:24    Procedures Procedures (including critical care time)  Medications Ordered in ED Medications  ipratropium-albuterol (DUONEB) 0.5-2.5 (3) MG/3ML nebulizer solution 3 mL (3 mLs Nebulization  Given 08/05/17 0913)  albuterol (PROVENTIL) (2.5 MG/3ML) 0.083% nebulizer solution 2.5 mg (2.5 mg Nebulization Given 08/05/17 0912)  methylPREDNISolone sodium succinate (SOLU-MEDROL) 125 mg/2 mL injection 125 mg (125 mg Intravenous Given 08/05/17 0911)  magnesium sulfate IVPB 2 g 50 mL (2 g Intravenous New Bag/Given 08/05/17 0913)  ipratropium-albuterol (DUONEB) 0.5-2.5 (3) MG/3ML nebulizer solution 3 mL (3 mLs Nebulization Given 08/05/17 1105)  albuterol (PROVENTIL) (2.5 MG/3ML) 0.083% nebulizer solution 2.5 mg (2.5 mg Nebulization Given 08/05/17 1105)     Initial Impression / Assessment and Plan / ED Course  I have reviewed the triage vital signs and the nursing notes.  Pertinent labs & imaging results that were available during my care of the patient were reviewed by me and considered in my medical decision making (see chart for details).     Patient with exacerbation of COPD. Patient improved with neb treatments and steroids and magnesium. Patient wishes to go home. Patient has minimal wheezing at discharge. Her prednisone will be increased to 30 mg a day she'll be given an antibiotic for possible respiratory infection. She will follow-up with her doctor next week or return if problems  Final Clinical Impressions(s) / ED Diagnoses   Final diagnoses:  COPD exacerbation ([place])    New Prescriptions New Prescriptions   LEVOFLOXACIN (LEVAQUIN) 500 MG TABLET    Take 1 tablet (500 mg total) by mouth daily.     Milton Ferguson, MD 08/05/17 1208

## 2017-08-05 NOTE — ED Notes (Signed)
Respiratory called to bedside.

## 2017-08-05 NOTE — ED Notes (Signed)
ED Provider at bedside. 

## 2017-08-09 ENCOUNTER — Encounter: Payer: Self-pay | Admitting: Internal Medicine

## 2017-08-09 ENCOUNTER — Ambulatory Visit (INDEPENDENT_AMBULATORY_CARE_PROVIDER_SITE_OTHER): Payer: Medicare Other | Admitting: Internal Medicine

## 2017-08-09 VITALS — BP 124/60 | HR 80 | Temp 97.3°F | Resp 20 | Ht 63.0 in | Wt 128.8 lb

## 2017-08-09 DIAGNOSIS — J449 Chronic obstructive pulmonary disease, unspecified: Secondary | ICD-10-CM

## 2017-08-09 DIAGNOSIS — J984 Other disorders of lung: Secondary | ICD-10-CM

## 2017-08-09 DIAGNOSIS — I248 Other forms of acute ischemic heart disease: Secondary | ICD-10-CM | POA: Diagnosis not present

## 2017-08-09 MED ORDER — CARBIDOPA-LEVODOPA 25-100 MG PO TABS: 1.0000 | ORAL_TABLET | Freq: Three times a day (TID) | ORAL | 1 refills | Status: DC

## 2017-08-09 NOTE — Progress Notes (Signed)
Subjective:    Patient ID: Marie [name], female    DOB: 24-Feb-1936, 81 y.o.   MRN: [id number]  HPI   This nice 81 yo poorly compliant MWF with O2 dependent moderate Restrictive Lung Dz and reactive airway Dz was recently hospitalized 10/2-3 for acute respiratory insufficiency and seen in f/u on 10/26/2016. Patient also was also recently dx'd with a supranuclear palsy and started on tx with g Sinemet.      Then she was seen again on 08/05/2017 after panicking at home calling 911 to transport to the ER. Her daughter and husband are present today and admit her poor compliance with her QID Nebulizer schedule and her medications. She was treated w/ Nebulizers x 2 and clinically improved and was released home. Patient was Rx'd Levaquin x 7 days and advised to increase her prednisone to 30 mg / daily.  Medication Sig  . albuterol (PROVENTIL) (2.5 MG/3ML) 0.083% nebulizer solution Use 1 ampule in nebulizer 4 times a day or every 4 hours.  Marland Kitchen aspirin 81 MG tablet Take 81 mg by mouth daily. Buys OTC  . benzonatate (TESSALON) 100 MG capsule TAKE ONE CAPSULE BY MOUTH THREE TIMES DAILY AS NEEDED FOR  COUGH  . budesonide (PULMICORT) 0.5 MG/2ML nebulizer solution Take 2 mLs (0.5 mg total) by nebulization 2 (two) times daily. Dx-J45.909, J44.0, J20.9  . Fluticasone-Salmeterol (ADVAIR DISKUS) 250-50 MCG/DOSE AEPB Inhale 1 puff into the lungs 2 (two) times daily.  Marland Kitchen levofloxacin (LEVAQUIN) 500 MG tablet Take 1 tablet (500 mg total) by mouth daily.  Marland Kitchen losartan-hydrochlorothiazide (HYZAAR) 100-25 MG tablet Take 1 tablet by mouth daily.  Marland Kitchen omeprazole (PRILOSEC) 40 MG capsule Take 1 capsule (40 mg total) by mouth 2 (two) times daily.  . pravastatin (PRAVACHOL) 40 MG tablet Take 20 mg by mouth daily.  . sodium chloride HYPERTONIC 3 % nebulizer solution Take 1 vial by nebulization BID  . nystatin (MYCOSTATIN) 100000 UNIT/ML suspension Take 5 mLs (500,000 Units total) by mouth 2 (two) times daily.   Allergies  Allergen  Reactions  . Ertapenem Hives    Caused whole body to turn red Other reaction(s): Redness Caused whole body to turn red  . Codeine Other (See Comments)    hallucinations  . Demerol Other (See Comments)    hallucinations  . Morphine And Related Other (See Comments)    hallucinations  . Other Other (See Comments)    Invan 7- pt became red all over  . Sulfate Other (See Comments)    unknown   Past Medical History:  Diagnosis Date  . Balance disorder 2008.     Falls a lot.  She can be standing and then leans too far over to one side   . Bulging disc   . Chronic bronchitis ([place])    due to congestion at times, on prednisone and advair  . COPD (chronic obstructive pulmonary disease) ([place])   . Depression    many years ago  . GERD (gastroesophageal reflux disease)   . Hearing loss    mild  . Hyperlipidemia   . Hypertension   . Iatrogenic adrenal insufficiency ([place])   . Incontinence    not indicated at this visit.  Marland Kitchen LVH (left ventricular hypertrophy) due to hypertensive disease    a. 02/2017: echo showing an EF of 65-70% with moderate LVH, hyperdynamic LV with subvalvular gradient of 70 mmHg, SAM, and mild MR  . Neuropathy    legs stay numb   . Osteoarthritis    hands,   .  Osteoporosis   . Shortness of breath dyspnea   . Tubulovillous adenoma of rectum   . Wears dentures   . Wears glasses    Review of Systems  10 point systems review negative except as above.    Objective:   Physical Exam  BP 124/60   Pulse 80   Temp (!) 97.3 F (36.3 C)   Resp 20   Ht 5\' 3"  (1.6 m)   Wt 128 lb 12.8 oz (58.4 kg)   BMI 22.82 kg/m   HEENT - Eac's patent. TM's Nl. EOM's full. PERRLA. NasoOroPharynx clear. Neck - supple. Nl Thyroid. Carotids 2+ & No bruits, nodes, JVD Chest - BS very distant , but clear w/o rales, rhonchi, wheezes. Cor - Nl HS. RRR w/o sig MGR. PP 1(+). No edema. MS- FROM w/o deformities. Muscle power, tone and bulk Nl. Gait Nl. Neuro - No obvious Cr N  abnormalities.Nl w/o focal abnormalities. Psyche - La bell indifference attitude    Assessment & Plan:   1. COPD with chronic bronchitis ([place])   2. Restrictive lung disease  - Advised completing her 7 day Levaquin an also quickly taper her maintenance Prednisone back to 10 mg / daily.  - Strongly reiterated the Importance of better med and pulm neb compliance

## 2017-08-30 DIAGNOSIS — K648 Other hemorrhoids: Secondary | ICD-10-CM | POA: Diagnosis not present

## 2017-08-31 ENCOUNTER — Other Ambulatory Visit: Payer: Self-pay | Admitting: Internal Medicine

## 2017-08-31 MED ORDER — OMEPRAZOLE 40 MG PO CPDR: DELAYED_RELEASE_CAPSULE | ORAL | 1 refills | Status: DC

## 2017-08-31 MED ORDER — LOSARTAN POTASSIUM 100 MG PO TABS: ORAL_TABLET | ORAL | 1 refills | Status: DC

## 2017-09-01 ENCOUNTER — Ambulatory Visit: Payer: Self-pay | Admitting: Internal Medicine

## 2017-09-01 NOTE — Progress Notes (Signed)
NO SHOW

## 2017-09-03 ENCOUNTER — Other Ambulatory Visit: Payer: Self-pay | Admitting: Internal Medicine

## 2017-09-03 DIAGNOSIS — K219 Gastro-esophageal reflux disease without esophagitis: Secondary | ICD-10-CM

## 2017-09-03 DIAGNOSIS — I10 Essential (primary) hypertension: Secondary | ICD-10-CM

## 2017-09-03 MED ORDER — FUROSEMIDE 40 MG PO TABS: ORAL_TABLET | ORAL | 1 refills | Status: DC

## 2017-09-03 MED ORDER — OMEPRAZOLE 40 MG PO CPDR: DELAYED_RELEASE_CAPSULE | ORAL | 1 refills | Status: DC

## 2017-09-03 MED ORDER — OLMESARTAN MEDOXOMIL 20 MG PO TABS: ORAL_TABLET | ORAL | 1 refills | Status: DC

## 2017-09-13 ENCOUNTER — Telehealth: Payer: Self-pay | Admitting: *Deleted

## 2017-09-13 ENCOUNTER — Other Ambulatory Visit: Payer: Self-pay | Admitting: *Deleted

## 2017-09-13 DIAGNOSIS — I10 Essential (primary) hypertension: Secondary | ICD-10-CM

## 2017-09-13 DIAGNOSIS — K219 Gastro-esophageal reflux disease without esophagitis: Secondary | ICD-10-CM

## 2017-09-13 MED ORDER — CEFUROXIME AXETIL 250 MG PO TABS: 250.0000 mg | ORAL_TABLET | Freq: Two times a day (BID) | ORAL | 0 refills | Status: DC

## 2017-09-13 MED ORDER — OMEPRAZOLE 40 MG PO CPDR: DELAYED_RELEASE_CAPSULE | ORAL | 1 refills | Status: DC

## 2017-09-13 MED ORDER — FUROSEMIDE 40 MG PO TABS: ORAL_TABLET | ORAL | 1 refills | Status: DC

## 2017-09-13 NOTE — Telephone Encounter (Signed)
Patient called and states she has chest congestion and productive cough,  with yellow mucus x 2 days. Per Dr Melford Aase, an RX for Ceftin 250 mg has been sent to [facility] and patient was advised to take OTC Mucus Relief 1200 mg 2 times a day.

## 2017-09-20 ENCOUNTER — Other Ambulatory Visit: Payer: Self-pay

## 2017-09-20 ENCOUNTER — Encounter (HOSPITAL_COMMUNITY): Payer: Self-pay | Admitting: Emergency Medicine

## 2017-09-20 ENCOUNTER — Emergency Department (HOSPITAL_COMMUNITY): Payer: Medicare Other

## 2017-09-20 ENCOUNTER — Inpatient Hospital Stay (HOSPITAL_COMMUNITY)
Admission: EM | Admit: 2017-09-20 | Discharge: 2017-09-22 | DRG: 193 | Disposition: A | Discharge status: Home / Self Care | Payer: Medicare Other | Attending: Family Medicine | Admitting: Family Medicine

## 2017-09-20 DIAGNOSIS — J449 Chronic obstructive pulmonary disease, unspecified: Secondary | ICD-10-CM | POA: Diagnosis not present

## 2017-09-20 DIAGNOSIS — J9601 Acute respiratory failure with hypoxia: Secondary | ICD-10-CM

## 2017-09-20 DIAGNOSIS — J9621 Acute and chronic respiratory failure with hypoxia: Secondary | ICD-10-CM | POA: Diagnosis present

## 2017-09-20 DIAGNOSIS — R1312 Dysphagia, oropharyngeal phase: Secondary | ICD-10-CM | POA: Diagnosis present

## 2017-09-20 DIAGNOSIS — G629 Polyneuropathy, unspecified: Secondary | ICD-10-CM | POA: Diagnosis present

## 2017-09-20 DIAGNOSIS — Z885 Allergy status to narcotic agent status: Secondary | ICD-10-CM

## 2017-09-20 DIAGNOSIS — I10 Essential (primary) hypertension: Secondary | ICD-10-CM | POA: Diagnosis not present

## 2017-09-20 DIAGNOSIS — Z9981 Dependence on supplemental oxygen: Secondary | ICD-10-CM | POA: Diagnosis not present

## 2017-09-20 DIAGNOSIS — J189 Pneumonia, unspecified organism: Secondary | ICD-10-CM | POA: Diagnosis not present

## 2017-09-20 DIAGNOSIS — G20 Parkinson's disease: Secondary | ICD-10-CM | POA: Diagnosis present

## 2017-09-20 DIAGNOSIS — G231 Progressive supranuclear ophthalmoplegia [Steele-Richardson-Olszewski]: Secondary | ICD-10-CM | POA: Diagnosis present

## 2017-09-20 DIAGNOSIS — E876 Hypokalemia: Secondary | ICD-10-CM | POA: Diagnosis not present

## 2017-09-20 DIAGNOSIS — J984 Other disorders of lung: Secondary | ICD-10-CM

## 2017-09-20 DIAGNOSIS — Z79899 Other long term (current) drug therapy: Secondary | ICD-10-CM

## 2017-09-20 DIAGNOSIS — R0602 Shortness of breath: Secondary | ICD-10-CM | POA: Diagnosis not present

## 2017-09-20 DIAGNOSIS — G232 Striatonigral degeneration: Secondary | ICD-10-CM | POA: Diagnosis present

## 2017-09-20 DIAGNOSIS — J441 Chronic obstructive pulmonary disease with (acute) exacerbation: Secondary | ICD-10-CM

## 2017-09-20 DIAGNOSIS — Z888 Allergy status to other drugs, medicaments and biological substances status: Secondary | ICD-10-CM

## 2017-09-20 DIAGNOSIS — J440 Chronic obstructive pulmonary disease with acute lower respiratory infection: Secondary | ICD-10-CM | POA: Diagnosis present

## 2017-09-20 DIAGNOSIS — J9611 Chronic respiratory failure with hypoxia: Secondary | ICD-10-CM | POA: Diagnosis present

## 2017-09-20 DIAGNOSIS — Z7952 Long term (current) use of systemic steroids: Secondary | ICD-10-CM

## 2017-09-20 DIAGNOSIS — M810 Age-related osteoporosis without current pathological fracture: Secondary | ICD-10-CM | POA: Diagnosis present

## 2017-09-20 DIAGNOSIS — R0902 Hypoxemia: Secondary | ICD-10-CM | POA: Diagnosis not present

## 2017-09-20 DIAGNOSIS — K219 Gastro-esophageal reflux disease without esophagitis: Secondary | ICD-10-CM | POA: Diagnosis present

## 2017-09-20 DIAGNOSIS — Z7982 Long term (current) use of aspirin: Secondary | ICD-10-CM

## 2017-09-20 DIAGNOSIS — E785 Hyperlipidemia, unspecified: Secondary | ICD-10-CM | POA: Diagnosis present

## 2017-09-20 DIAGNOSIS — E782 Mixed hyperlipidemia: Secondary | ICD-10-CM | POA: Diagnosis not present

## 2017-09-20 DIAGNOSIS — G20C Parkinsonism, unspecified: Secondary | ICD-10-CM | POA: Diagnosis present

## 2017-09-20 LAB — TROPONIN I

## 2017-09-20 LAB — CBC WITH DIFFERENTIAL/PLATELET
BASOS PCT: 2 %
Basophils Absolute: 0.2 10*3/uL — ABNORMAL HIGH (ref 0.0–0.1)
EOS ABS: 1.3 10*3/uL — AB (ref 0.0–0.7)
Eosinophils Relative: 14 %
HEMATOCRIT: 50.2 % — AB (ref 36.0–46.0)
Hemoglobin: 15.7 g/dL — ABNORMAL HIGH (ref 12.0–15.0)
LYMPHS ABS: 1.8 10*3/uL (ref 0.7–4.0)
LYMPHS PCT: 20 %
MCH: 29 pg (ref 26.0–34.0)
MCHC: 31.3 g/dL (ref 30.0–36.0)
MCV: 92.6 fL (ref 78.0–100.0)
MONOS PCT: 7 %
Monocytes Absolute: 0.7 10*3/uL (ref 0.1–1.0)
NEUTROS ABS: 5.4 10*3/uL (ref 1.7–7.7)
NEUTROS PCT: 57 %
Platelets: 225 10*3/uL (ref 150–400)
RBC: 5.42 MIL/uL — ABNORMAL HIGH (ref 3.87–5.11)
RDW: 14.1 % (ref 11.5–15.5)
WBC: 9.4 10*3/uL (ref 4.0–10.5)

## 2017-09-20 LAB — I-STAT CG4 LACTIC ACID, ED: LACTIC ACID, VENOUS: 1.65 mmol/L (ref 0.5–1.9)

## 2017-09-20 LAB — BASIC METABOLIC PANEL
Anion gap: 9 (ref 5–15)
BUN: 22 mg/dL — AB (ref 6–20)
CO2: 31 mmol/L (ref 22–32)
Calcium: 9.3 mg/dL (ref 8.9–10.3)
Chloride: 101 mmol/L (ref 101–111)
Creatinine, Ser: 1.04 mg/dL — ABNORMAL HIGH (ref 0.44–1.00)
GFR calc Af Amer: 58 mL/min — ABNORMAL LOW (ref >60)
GFR, EST NON AFRICAN AMERICAN: 50 mL/min — AB (ref >60)
GLUCOSE: 102 mg/dL — AB (ref 65–99)
POTASSIUM: 2.9 mmol/L — AB (ref 3.5–5.1)
Sodium: 141 mmol/L (ref 135–145)

## 2017-09-20 LAB — MAGNESIUM: Magnesium: 2.2 mg/dL (ref 1.7–2.4)

## 2017-09-20 LAB — MRSA PCR SCREENING: MRSA by PCR: POSITIVE — AB

## 2017-09-20 LAB — BRAIN NATRIURETIC PEPTIDE: B Natriuretic Peptide: 153 pg/mL — ABNORMAL HIGH (ref 0.0–100.0)

## 2017-09-20 MED ORDER — LEVOFLOXACIN IN D5W 750 MG/150ML IV SOLN
750.0000 mg | Freq: Once | INTRAVENOUS | Status: AC
  Administered 2017-09-20: 750 mg via INTRAVENOUS
  Filled 2017-09-20: qty 150

## 2017-09-20 MED ORDER — PANTOPRAZOLE SODIUM 40 MG PO TBEC
40.0000 mg | DELAYED_RELEASE_TABLET | Freq: Every day | ORAL | Status: DC
  Administered 2017-09-20 – 2017-09-21 (×2): 40 mg via ORAL
  Filled 2017-09-20 (×3): qty 1

## 2017-09-20 MED ORDER — ASPIRIN 81 MG PO CHEW
81.0000 mg | CHEWABLE_TABLET | Freq: Every day | ORAL | Status: DC
  Administered 2017-09-20 – 2017-09-21 (×2): 81 mg via ORAL
  Filled 2017-09-20 (×4): qty 1

## 2017-09-20 MED ORDER — SODIUM CHLORIDE 0.9% FLUSH: 3.0000 mL | INTRAVENOUS | Status: DC | PRN

## 2017-09-20 MED ORDER — METHYLPREDNISOLONE SODIUM SUCC 125 MG IJ SOLR
125.0000 mg | Freq: Once | INTRAMUSCULAR | Status: AC
  Administered 2017-09-20: 125 mg via INTRAVENOUS
  Filled 2017-09-20: qty 2

## 2017-09-20 MED ORDER — GUAIFENESIN 100 MG/5ML PO SOLN
15.0000 mL | Freq: Four times a day (QID) | ORAL | Status: DC
  Administered 2017-09-20 – 2017-09-22 (×2): 300 mg via ORAL
  Filled 2017-09-20: qty 5
  Filled 2017-09-20: qty 15
  Filled 2017-09-20 (×3): qty 5

## 2017-09-20 MED ORDER — SODIUM CHLORIDE 0.9 % IV SOLN: 250.0000 mL | INTRAVENOUS | Status: DC | PRN

## 2017-09-20 MED ORDER — SODIUM CHLORIDE 0.9% FLUSH
3.0000 mL | Freq: Two times a day (BID) | INTRAVENOUS | Status: DC
  Administered 2017-09-20 – 2017-09-21 (×3): 3 mL via INTRAVENOUS

## 2017-09-20 MED ORDER — METHYLPREDNISOLONE SODIUM SUCC 125 MG IJ SOLR
60.0000 mg | Freq: Four times a day (QID) | INTRAMUSCULAR | Status: DC
  Administered 2017-09-20 – 2017-09-22 (×7): 60 mg via INTRAVENOUS
  Filled 2017-09-20 (×7): qty 2

## 2017-09-20 MED ORDER — ENSURE ENLIVE PO LIQD
237.0000 mL | Freq: Two times a day (BID) | ORAL | Status: DC
  Administered 2017-09-20 – 2017-09-21 (×3): 237 mL via ORAL

## 2017-09-20 MED ORDER — ONDANSETRON HCL 4 MG PO TABS
4.0000 mg | ORAL_TABLET | Freq: Four times a day (QID) | ORAL | Status: DC | PRN
Start: 2017-09-20 — End: 2017-09-22

## 2017-09-20 MED ORDER — ACETAMINOPHEN 650 MG RE SUPP
650.0000 mg | Freq: Once | RECTAL | Status: AC
  Administered 2017-09-20: 650 mg via RECTAL
  Filled 2017-09-20: qty 1

## 2017-09-20 MED ORDER — MUPIROCIN 2 % EX OINT
1.0000 "application " | TOPICAL_OINTMENT | Freq: Two times a day (BID) | CUTANEOUS | Status: DC
  Administered 2017-09-20 – 2017-09-21 (×3): 1 via NASAL
  Filled 2017-09-20 (×2): qty 22

## 2017-09-20 MED ORDER — ONDANSETRON HCL 4 MG/2ML IJ SOLN: 4.0000 mg | Freq: Four times a day (QID) | INTRAMUSCULAR | Status: DC | PRN

## 2017-09-20 MED ORDER — POTASSIUM CHLORIDE 20 MEQ PO PACK
40.0000 meq | PACK | ORAL | Status: AC
  Administered 2017-09-20 – 2017-09-21 (×3): 40 meq via ORAL
  Filled 2017-09-20 (×6): qty 2

## 2017-09-20 MED ORDER — MOMETASONE FURO-FORMOTEROL FUM 200-5 MCG/ACT IN AERO
2.0000 | INHALATION_SPRAY | Freq: Two times a day (BID) | RESPIRATORY_TRACT | Status: DC
  Administered 2017-09-20 – 2017-09-22 (×4): 2 via RESPIRATORY_TRACT
  Filled 2017-09-20 (×2): qty 8.8

## 2017-09-20 MED ORDER — ACETAMINOPHEN 650 MG RE SUPP: 650.0000 mg | Freq: Four times a day (QID) | RECTAL | Status: DC | PRN

## 2017-09-20 MED ORDER — SODIUM CHLORIDE 3 % IN NEBU
4.0000 mL | INHALATION_SOLUTION | Freq: Two times a day (BID) | RESPIRATORY_TRACT | Status: AC
  Administered 2017-09-20 – 2017-09-22 (×4): 4 mL via RESPIRATORY_TRACT
  Filled 2017-09-20 (×4): qty 4

## 2017-09-20 MED ORDER — ALBUTEROL SULFATE (2.5 MG/3ML) 0.083% IN NEBU: 2.5000 mg | INHALATION_SOLUTION | RESPIRATORY_TRACT | Status: DC | PRN

## 2017-09-20 MED ORDER — IRBESARTAN 150 MG PO TABS
150.0000 mg | ORAL_TABLET | Freq: Every day | ORAL | Status: DC
  Administered 2017-09-20 – 2017-09-21 (×2): 150 mg via ORAL
  Filled 2017-09-20 (×3): qty 1

## 2017-09-20 MED ORDER — PRAVASTATIN SODIUM 40 MG PO TABS
40.0000 mg | ORAL_TABLET | Freq: Every day | ORAL | Status: DC
  Administered 2017-09-20 – 2017-09-21 (×2): 40 mg via ORAL
  Filled 2017-09-20 (×2): qty 1

## 2017-09-20 MED ORDER — CARBIDOPA-LEVODOPA 25-100 MG PO TABS
1.0000 | ORAL_TABLET | Freq: Three times a day (TID) | ORAL | Status: DC
  Administered 2017-09-20 – 2017-09-21 (×5): 1 via ORAL
  Filled 2017-09-20 (×6): qty 1

## 2017-09-20 MED ORDER — ACETAMINOPHEN 325 MG PO TABS: 650.0000 mg | ORAL_TABLET | Freq: Four times a day (QID) | ORAL | Status: DC | PRN

## 2017-09-20 MED ORDER — ALBUTEROL SULFATE (2.5 MG/3ML) 0.083% IN NEBU
5.0000 mg | INHALATION_SOLUTION | Freq: Once | RESPIRATORY_TRACT | Status: AC
  Administered 2017-09-20: 5 mg via RESPIRATORY_TRACT
  Filled 2017-09-20: qty 6

## 2017-09-20 MED ORDER — LEVOFLOXACIN IN D5W 500 MG/100ML IV SOLN
500.0000 mg | INTRAVENOUS | Status: DC
  Administered 2017-09-21: 500 mg via INTRAVENOUS
  Filled 2017-09-20 (×2): qty 100

## 2017-09-20 MED ORDER — ENOXAPARIN SODIUM 40 MG/0.4ML ~~LOC~~ SOLN
40.0000 mg | SUBCUTANEOUS | Status: DC
  Administered 2017-09-20 – 2017-09-21 (×2): 40 mg via SUBCUTANEOUS
  Filled 2017-09-20 (×2): qty 0.4

## 2017-09-20 MED ORDER — CHLORHEXIDINE GLUCONATE CLOTH 2 % EX PADS
6.0000 | MEDICATED_PAD | Freq: Every day | CUTANEOUS | Status: DC
  Administered 2017-09-21 – 2017-09-22 (×2): 6 via TOPICAL

## 2017-09-20 MED ORDER — POTASSIUM CHLORIDE 10 MEQ/100ML IV SOLN
10.0000 meq | Freq: Once | INTRAVENOUS | Status: AC
  Administered 2017-09-20: 10 meq via INTRAVENOUS
  Filled 2017-09-20: qty 100

## 2017-09-20 MED ORDER — IPRATROPIUM-ALBUTEROL 0.5-2.5 (3) MG/3ML IN SOLN
3.0000 mL | Freq: Four times a day (QID) | RESPIRATORY_TRACT | Status: DC
  Administered 2017-09-20 – 2017-09-22 (×5): 3 mL via RESPIRATORY_TRACT
  Filled 2017-09-20 (×6): qty 3

## 2017-09-20 NOTE — ED Notes (Signed)
ED Provider at bedside. 

## 2017-09-20 NOTE — Progress Notes (Deleted)
History and Physical    [name] MVH:[id number] DOB: [birth year] DOA: 09/20/2017  PCP: [name], MD  Patient coming from: Home  I have personally briefly reviewed patient's old medical records in [place]  Chief Complaint: Shortness of breath  HPI: Marie [name] is a 81 y.o. female with medical history significant of oxygen and steroid-dependent COPD, chronic respiratory failure, presents to the hospital complains of shortness of breath.  Patient complains with 2-week history of progressive shortness of breath.  She has had productive cough, no fever.  She has had increasing wheezing.  She has been using her nebulizer without significant benefit.  She has not had any chest pain, vomiting, diarrhea.  She has not had any sick contacts.  She denies any myalgias or sinus congestion.  ED Course: On arrival to the emergency room, she was initially on BiPAP and received albuterol treatments.  She was noted to be 70% on 2 L.  After receiving albuterol treatment and steroids, respiratory status improved.  She was taken off BiPAP but remains significantly short of breath.  Chest x-ray indicated possible pneumonia.  She was also hypokalemic with a potassium of 2.9.  She has been referred for admission.  Review of Systems: As per HPI otherwise 10 point review of systems negative.    Past Medical History:  Diagnosis Date  . Balance disorder 2008.     Falls a lot.  She can be standing and then leans too far over to one side   . Bulging disc   . Chronic bronchitis ([facility])    due to congestion at times, on prednisone and advair  . COPD (chronic obstructive pulmonary disease) ([place])   . Depression    many years ago  . GERD (gastroesophageal reflux disease)   . Hearing loss    mild  . Hyperlipidemia   . Hypertension   . Iatrogenic adrenal insufficiency ([place])   . Incontinence    not indicated at this visit.  Marland Kitchen LVH (left ventricular hypertrophy) due to hypertensive disease    a. 02/2017: echo showing an EF of 65-70% with moderate LVH, hyperdynamic LV with subvalvular gradient of 70 mmHg, SAM, and mild MR  . Neuropathy    legs stay numb   . Osteoarthritis    hands,   . Osteoporosis   . Shortness of breath dyspnea   . Tubulovillous adenoma of rectum   . Wears dentures   . Wears glasses     Past Surgical History:  Procedure Laterality Date  . ABDOMINAL HYSTERECTOMY    . APPENDECTOMY    . CHONDROPLASTY  09/19/2014   Procedure: CHONDROPLASTY;  Surgeon: Alta Corning, MD;  Location: [facility];  Service: Orthopedics;;  . DILATION AND CURETTAGE OF UTERUS    . EXTERNAL FIXATION LEG  10/25/2012   Procedure: EXTERNAL FIXATION LEG;  Surgeon: Rozanna Box, MD;  Location: [facility];  Service: Orthopedics;  Laterality: Right;  . EYE SURGERY     bilateral cataract surgery and lens implant  . FINGER SURGERY     fusions and debridements for OA  . INCONTINENCE SURGERY     multiple procedures, not cured  . KNEE ARTHROSCOPY WITH LATERAL MENISECTOMY Right 09/19/2014   Procedure: KNEE ARTHROSCOPY WITH LATERAL MENISECTOMY;  Surgeon: Alta Corning, MD;  Location: [facility];  Service: Orthopedics;  Laterality: Right;  . KNEE ARTHROSCOPY WITH MEDIAL MENISECTOMY Right 09/19/2014   Procedure: RIGHT KNEE ARTHROSCOPY WITH MEDIAL AND LATERAL MENISECTOMIES. CHONDROPLASTY  OF PATELLA-FEMORAL JOINT;  Surgeon: Alta Corning, MD;  Location: [facility];  Service: Orthopedics;  Laterality: Right;  . RECTAL BIOPSY  09/21/2011   Procedure: BIOPSY RECTAL;  Surgeon: Merrie Roof, MD;  Location: [facility];  Service: General;  Laterality: N/A;  3-4 cm  . RECTAL SURGERY     by dr. Marlou Starks, removal of polyp  . TONSILLECTOMY       reports that  has never smoked. she has never used smokeless tobacco. She reports that she does not drink alcohol or use drugs.  Allergies  Allergen Reactions  . Ertapenem Hives    Caused whole body to turn red Other reaction(s):  Redness Caused whole body to turn red  . Codeine Other (See Comments)    hallucinations  . Demerol Other (See Comments)    hallucinations  . Morphine And Related Other (See Comments)    hallucinations  . Other Other (See Comments)    Invan 7- pt became red all over  . Sulfate Other (See Comments)    unknown    Family History  Problem Relation Age of Onset  . Cancer Sister        pt unaware of what kind  . High blood pressure Mother   . COPD Mother   . Heart attack Father     Prior to Admission medications   Medication Sig Start Date End Date Taking? Authorizing Provider  albuterol (PROVENTIL) (2.5 MG/3ML) 0.083% nebulizer solution Use 1 ampule in nebulizer 4 times a day or every 4 hours. Patient taking differently: Take 2.5 mg by nebulization every 6 (six) hours as needed. Use 1 ampule in nebulizer 4 times a day or every 4 hours. 05/13/17  Yes [name], MD  aspirin 81 MG tablet Take 81 mg by mouth daily. Buys OTC   Yes [provider]  budesonide (PULMICORT) 0.5 MG/2ML nebulizer solution Take 2 mLs (0.5 mg total) by nebulization 2 (two) times daily. Dx-J45.909, J44.0, J20.9 05/13/17  Yes [name], MD  carbidopa-levodopa (SINEMET IR) 25-100 MG tablet Take 1 tablet by mouth 3 (three) times daily. 08/09/17  Yes [name], MD  Cholecalciferol (VITAMIN D) 2000 units CAPS Take 1 capsule by mouth daily.   Yes [provider]  Fluticasone-Salmeterol (ADVAIR DISKUS) 250-50 MCG/DOSE AEPB Inhale 1 puff into the lungs 2 (two) times daily. 05/14/17  Yes [name], MD  furosemide (LASIX) 40 MG tablet Take 1 tablet daily for fluid retention or an=kle swelling 09/13/17 04/13/18 Yes [name], MD  olmesartan [facility]) 20 MG tablet Take 1 tablet daily for BP (replaces Losartan) 09/03/17 04/03/18 Yes [name], MD  omeprazole (PRILOSEC) 40 MG capsule Take 1 capsule daily for Acid Reflux 09/13/17 04/13/18 Yes [name], MD  pravastatin  (PRAVACHOL) 40 MG tablet Take 40 mg by mouth daily.    Yes [provider]  predniSONE (DELTASONE) 10 MG tablet Take 10 mg by mouth daily with breakfast.   Yes [provider]  PROCTOZONE-HC 2.5 % rectal cream Apply 1 application topically 2 (two) times daily. 08/30/17  Yes [provider]  sodium chloride HYPERTONIC 3 % nebulizer solution Take 1 vial by nebulization BID 02/24/17  Yes Juanito Doom, MD  benzonatate (TESSALON) 100 MG capsule TAKE ONE CAPSULE BY MOUTH THREE TIMES DAILY AS NEEDED FOR  COUGH Patient not taking: Reported on 09/20/2017 07/13/17   [name], MD    Physical Exam: Vitals:   09/20/17 1500 09/20/17 1600 09/20/17 1627 09/20/17  1700  BP: (!) 127/58 116/84  117/81  Pulse: 76 93 93 97  Resp: 20 (!) 27 (!) 25 (!) 22  Temp:   98.9 F (37.2 C)   TempSrc:   Oral   SpO2: 98% 94% 92% 100%  Weight:      Height:        Constitutional: NAD, calm, comfortable Vitals:   09/20/17 1500 09/20/17 1600 09/20/17 1627 09/20/17 1700  BP: (!) 127/58 116/84  117/81  Pulse: 76 93 93 97  Resp: 20 (!) 27 (!) 25 (!) 22  Temp:   98.9 F (37.2 C)   TempSrc:   Oral   SpO2: 98% 94% 92% 100%  Weight:      Height:       Eyes: PERRL, lids and conjunctivae normal ENMT: Mucous membranes are moist. Posterior pharynx clear of any exudate or lesions.Normal dentition.  Neck: normal, supple, no masses, no thyromegaly Respiratory: Diminished breath sounds with wheezing bilaterally.  Increased respiratory effort. Cardiovascular: Regular rate and rhythm, no murmurs / rubs / gallops. No extremity edema. 2+ pedal pulses. No carotid bruits.  Abdomen: no tenderness, no masses palpated. No hepatosplenomegaly. Bowel sounds positive.  Musculoskeletal: no clubbing / cyanosis. No joint deformity upper and lower extremities. Good ROM, no contractures. Normal muscle tone.  Skin: no rashes, lesions, ulcers. No induration Neurologic: CN 2-12 grossly intact. Sensation intact,  DTR normal. Strength 5/5 in all 4.  Psychiatric: Normal judgment and insight. Alert and oriented x 3. Normal mood.   Labs on Admission: I have personally reviewed following labs and imaging studies  CBC: Recent Labs  Lab 09/20/17 0727  WBC 9.4  NEUTROABS 5.4  HGB 15.7*  HCT 50.2*  MCV 92.6  PLT 831   Basic Metabolic Panel: Recent Labs  Lab 09/20/17 0727 09/20/17 0755  NA 141  --   K 2.9*  --   CL 101  --   CO2 31  --   GLUCOSE 102*  --   BUN 22*  --   CREATININE 1.04*  --   CALCIUM 9.3  --   MG  --  2.2   GFR: Estimated Creatinine Clearance: 37.9 mL/min (A) (by C-G formula based on SCr of 1.04 mg/dL (H)). Liver Function Tests: No results for input(s): AST, ALT, ALKPHOS, BILITOT, PROT, ALBUMIN in the last 168 hours. No results for input(s): LIPASE, AMYLASE in the last 168 hours. No results for input(s): AMMONIA in the last 168 hours. Coagulation Profile: No results for input(s): INR, PROTIME in the last 168 hours. Cardiac Enzymes: Recent Labs  Lab 09/20/17 0727  TROPONINI <0.03   BNP (last 3 results) No results for input(s): PROBNP in the last 8760 hours. HbA1C: No results for input(s): HGBA1C in the last 72 hours. CBG: No results for input(s): GLUCAP in the last 168 hours. Lipid Profile: No results for input(s): CHOL, HDL, LDLCALC, TRIG, CHOLHDL, LDLDIRECT in the last 72 hours. Thyroid Function Tests: No results for input(s): TSH, T4TOTAL, FREET4, T3FREE, THYROIDAB in the last 72 hours. Anemia Panel: No results for input(s): VITAMINB12, FOLATE, FERRITIN, TIBC, IRON, RETICCTPCT in the last 72 hours. Urine analysis:    Component Value Date/Time   COLORURINE YELLOW 02/17/2017 [place] 02/17/2017 1229   LABSPEC 1.015 02/17/2017 1229   PHURINE 6.5 02/17/2017 1229   GLUCOSEU NEGATIVE 02/17/2017 1229   HGBUR TRACE (A) 02/17/2017 1229   BILIRUBINUR NEGATIVE 02/17/2017 1229   KETONESUR NEGATIVE 02/17/2017 1229   PROTEINUR NEGATIVE 02/17/2017  1229  UROBILINOGEN 0.2 04/16/2015 2310   NITRITE NEGATIVE 02/17/2017 1229   LEUKOCYTESUR NEGATIVE 02/17/2017 1229    Radiological Exams on Admission: Dg Chest Port 1 View  Result Date: 09/20/2017 CLINICAL DATA:  Shortness of breath and wheezing. Personal history of pneumothorax. EXAM: PORTABLE CHEST 1 VIEW COMPARISON:  One-view chest x-ray 08/05/2017. FINDINGS: Heart size is normal. Aortic atherosclerosis is present. Mild pulmonary vascular congestion is present without edema. Mild bibasilar airspace disease is slightly worse on the left. There is no pneumothorax. A small left pleural effusion is not excluded. IMPRESSION: 1. Asymmetric mild left basilar airspace disease and possible effusion. Question pneumonia. 2. No pneumothorax. 3. Mild pulmonary vascular congestion. 4.  Aortic Atherosclerosis (ICD10-I70.0). Electronically Signed   By: San Morelle M.D.   On: 09/20/2017 07:45    EKG: Independently reviewed.  Sinus rhythm without acute changes.  Assessment/Plan Active Problems:   Hyperlipidemia   GERD (gastroesophageal reflux disease)   Hypokalemia   Essential hypertension   Restrictive lung disease   Parkinson's plus syndrome (HCC)   COPD with chronic bronchitis (HCC)   CAP (community acquired pneumonia)   Acute on chronic respiratory failure with hypoxia ([place])    1. Acute on chronic respiratory failure with hypoxia.  Patient is chronically on 2 L of oxygen.  On admission, due to worsening shortness of breath she required BiPAP therapy.  Respiratory failure is likely related to pneumonia in the setting of COPD exacerbation.  Will try and wean down oxygen as tolerated. 2. Pneumonia.  Patient was being treated with cefuroxime as an outpatient.  Will continue on IV Levaquin for now.  Continue pulmonary hygiene. 3. COPD exacerbation.  Patient has a history of oxygen and prednisone dependent COPD.  Will treat with intravenous steroids for now.  Continue antibiotics.  Continue  bronchodilators and pulmonary hygiene. 4. Parkinson plus syndrome.  Continue on Sinemet. 5. Hypokalemia.  Possibly related to frequent albuterol.  Will replace potassium.  Magnesium is normal. 6. GERD.  Continue on PPI 7. Hyperlipidemia.  Continue statin  DVT prophylaxis: lovenox Code Status: full code Family Communication: discussed with husband and daughter at the bedside Disposition Plan: discharge home once improved Consults called:  Admission status: inpatient, stepdown   Kathie Dike MD [facility] Pager [phone]  If 7PM-7AM, please contact night-coverage [email or web address] Password [facility]  09/20/2017, 5:47 PM

## 2017-09-20 NOTE — Progress Notes (Signed)
PHARMACY NOTE:  ANTIMICROBIAL RENAL DOSAGE ADJUSTMENT  Current antimicrobial regimen includes a mismatch between antimicrobial dosage and estimated renal function.  As per policy approved by the Pharmacy & Therapeutics and Medical Executive Committees, the antimicrobial dosage will be adjusted accordingly.  Current antimicrobial dosage:  Levaquin 750mg  IV q24h  Indication: CAP  Renal Function:  Estimated Creatinine Clearance: 37.9 mL/min (A) (by C-G formula based on SCr of 1.04 mg/dL (H)). []      On intermittent HD, scheduled: []      On CRRT    Antimicrobial dosage has been changed to:  Levaquin 500mg  IV q24h. Next dose due 12/4 at 1000  Additional comments:   Thank you for allowing pharmacy to be a part of this patient's care.  Vernie Ammons, [facility] 09/20/2017 6:00 PM

## 2017-09-20 NOTE — ED Triage Notes (Signed)
Pt HX of SOB  . 78% on 2L [place] at scene. Wheezing throughout. 2.5 albuterol given and CPAP on pt in route

## 2017-09-20 NOTE — ED Provider Notes (Signed)
[facility] EMERGENCY DEPARTMENT Provider Note   CSN: [id number] Arrival date & time: 09/20/17  0700     History   Chief Complaint Chief Complaint  Patient presents with  . Shortness of Breath    HPI Marie [name] is a 81 y.o. female.  Pt presents to the ED with SOB.  Pt is unable to give any hx due to the severe SOB.  EMS gives hx.  Pt has a hx of COPD and restrictive lung disease.  She normally wears 2L oxygen and was 78% on 2L.  Pt placed on Bipap by EMS and was given an albuterol neb.  Pt has been on cefuroxime since 11/26 with worsening of breathing.      Past Medical History:  Diagnosis Date  . Balance disorder 2008.     Falls a lot.  She can be standing and then leans too far over to one side   . Bulging disc   . Chronic bronchitis ([place])    due to congestion at times, on prednisone and advair  . COPD (chronic obstructive pulmonary disease) ([place])   . Depression    many years ago  . GERD (gastroesophageal reflux disease)   . Hearing loss    mild  . Hyperlipidemia   . Hypertension   . Iatrogenic adrenal insufficiency ([place])   . Incontinence    not indicated at this visit.  Marland Kitchen LVH (left ventricular hypertrophy) due to hypertensive disease    a. 02/2017: echo showing an EF of 65-70% with moderate LVH, hyperdynamic LV with subvalvular gradient of 70 mmHg, SAM, and mild MR  . Neuropathy    legs stay numb   . Osteoarthritis    hands,   . Osteoporosis   . Shortness of breath dyspnea   . Tubulovillous adenoma of rectum   . Wears dentures   . Wears glasses     Patient Active Problem List   Diagnosis Date Noted  . COPD with chronic bronchitis ([place]) 07/20/2017  . Hypertensive heart disease 03/29/2017  . Protein-calorie malnutrition, severe 03/17/2017  . 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 Restrictive lung disease 02/24/2017  . LVH (left ventricular hypertrophy) 01/07/2017  . Essential hypertension 12/28/2016  . Depression, major, in remission  ([place]) 09/22/2016  . At high risk for falls 10/16/2015  . Prediabetes 08/23/2014  . Vitamin D deficiency 08/23/2014  . Medication management 08/23/2014  . Unstable Gait 01/30/2014  . Incontinence   . Osteoarthritis   . Hyperlipidemia   . GERD (gastroesophageal reflux disease)   . Hereditary and idiopathic peripheral neuropathy   . Iatrogenic adrenal insufficiency ([place])   . Tubulovillous adenoma of rectum 09/04/2011    Past Surgical History:  Procedure Laterality Date  . ABDOMINAL HYSTERECTOMY    . APPENDECTOMY    . CHONDROPLASTY  09/19/2014   Procedure: CHONDROPLASTY;  Surgeon: Alta Corning, MD;  Location: [place];  Service: Orthopedics;;  . DILATION AND CURETTAGE OF UTERUS    . EXTERNAL FIXATION LEG  10/25/2012   Procedure: EXTERNAL FIXATION LEG;  Surgeon: Rozanna Box, MD;  Location: [place];  Service: Orthopedics;  Laterality: Right;  . EYE SURGERY     bilateral cataract surgery and lens implant  . FINGER SURGERY     fusions and debridements for OA  . INCONTINENCE SURGERY     multiple procedures, not cured  . KNEE ARTHROSCOPY WITH LATERAL MENISECTOMY Right 09/19/2014   Procedure: KNEE ARTHROSCOPY WITH LATERAL MENISECTOMY;  Surgeon: Karen Chafe  Berenice Primas, MD;  Location: [place];  Service: Orthopedics;  Laterality: Right;  . KNEE ARTHROSCOPY WITH MEDIAL MENISECTOMY Right 09/19/2014   Procedure: RIGHT KNEE ARTHROSCOPY WITH MEDIAL AND LATERAL MENISECTOMIES. CHONDROPLASTY OF PATELLA-FEMORAL JOINT;  Surgeon: Alta Corning, MD;  Location: [place];  Service: Orthopedics;  Laterality: Right;  . RECTAL BIOPSY  09/21/2011   Procedure: BIOPSY RECTAL;  Surgeon: Merrie Roof, MD;  Location: [place];  Service: General;  Laterality: N/A;  3-4 cm  . RECTAL SURGERY     by dr. Marlou Starks, removal of polyp  . TONSILLECTOMY      OB History    No data available       Home Medications    Prior to Admission medications   Medication Sig Start Date End  Date Taking? Authorizing Provider  albuterol (PROVENTIL) (2.5 MG/3ML) 0.083% nebulizer solution Use 1 ampule in nebulizer 4 times a day or every 4 hours. 05/13/17   [name], MD  aspirin 81 MG tablet Take 81 mg by mouth daily. Buys OTC    [provider]  benzonatate (TESSALON) 100 MG capsule TAKE ONE CAPSULE BY MOUTH THREE TIMES DAILY AS NEEDED FOR  COUGH 07/13/17   [name], MD  budesonide (PULMICORT) 0.5 MG/2ML nebulizer solution Take 2 mLs (0.5 mg total) by nebulization 2 (two) times daily. Dx-J45.909, J44.0, J20.9 05/13/17   [name], MD  carbidopa-levodopa (SINEMET IR) 25-100 MG tablet Take 1 tablet by mouth 3 (three) times daily. 08/09/17   [name], MD  cefUROXime (CEFTIN) 250 MG tablet Take 1 tablet (250 mg total) by mouth 2 (two) times daily with a meal. 09/13/17   [name], MD  Fluticasone-Salmeterol (ADVAIR DISKUS) 250-50 MCG/DOSE AEPB Inhale 1 puff into the lungs 2 (two) times daily. 05/14/17   [name], MD  furosemide (LASIX) 40 MG tablet Take 1 tablet daily for fluid retention or an=kle swelling 09/13/17 04/13/18  [name], MD  olmesartan [facility]) 20 MG tablet Take 1 tablet daily for BP (replaces Losartan) 09/03/17 04/03/18  [name], MD  omeprazole (PRILOSEC) 40 MG capsule Take 1 capsule daily for Acid Reflux 09/13/17 04/13/18  [name], MD  pravastatin (PRAVACHOL) 40 MG tablet Take 20 mg by mouth daily.    [provider]  predniSONE (DELTASONE) 10 MG tablet Take 10 mg by mouth daily with breakfast.    [provider]  sodium chloride HYPERTONIC 3 % nebulizer solution Take 1 vial by nebulization BID 02/24/17   Juanito Doom, MD    Family History Family History  Problem Relation Age of Onset  . Cancer Sister        pt unaware of what kind  . High blood pressure Mother   . COPD Mother   . Heart attack Father     Social History Social History   Tobacco Use  . Smoking status:  Never Smoker  . Smokeless tobacco: Never Used  Substance Use Topics  . Alcohol use: No  . Drug use: No     Allergies   Ertapenem; Codeine; Demerol; Morphine and related; Other; and Sulfate   Review of Systems Review of Systems  Respiratory: Positive for shortness of breath and wheezing.      Physical Exam Updated Vital Signs BP 118/74   Pulse 89   Temp 100 F (37.8 C)   Resp (!) 23   Ht 5\' 4"  (1.626 m)   Wt 59.9 kg (132 lb)   SpO2 96%  BMI 22.66 kg/m   Physical Exam  Constitutional: She is oriented to person, place, and time. She appears well-developed and well-nourished.  HENT:  Head: Normocephalic and atraumatic.  Mouth/Throat: Oropharynx is clear and moist.  Eyes: EOM are normal. Pupils are equal, round, and reactive to light.  Neck: Normal range of motion.  Cardiovascular: Normal rate, regular rhythm, normal heart sounds and intact distal pulses.  Pulses:      Carotid pulses are on the right side with bruit. Pulmonary/Chest: Tachypnea noted. She is in respiratory distress. She has wheezes.  Abdominal: Soft. Bowel sounds are normal.  Musculoskeletal: Normal range of motion.       Right lower leg: Normal.       Left lower leg: Normal.  Neurological: She is alert and oriented to person, place, and time.  Skin: Skin is warm and dry. Capillary refill takes less than 2 seconds.  Psychiatric: She has a normal mood and affect. Her behavior is normal.  Nursing note and vitals reviewed.    ED Treatments / Results  Labs (all labs ordered are listed, but only abnormal results are displayed) Labs Reviewed  BASIC METABOLIC PANEL - Abnormal; Notable for the following components:      Result Value   Potassium 2.9 (*)    Glucose, Bld 102 (*)    BUN 22 (*)    Creatinine, Ser 1.04 (*)    GFR calc non Af Amer 50 (*)    GFR calc Af Amer 58 (*)    All other components within normal limits  BRAIN NATRIURETIC PEPTIDE - Abnormal; Notable for the following components:    B Natriuretic Peptide 153.0 (*)    All other components within normal limits  CBC WITH DIFFERENTIAL/PLATELET - Abnormal; Notable for the following components:   RBC 5.42 (*)    Hemoglobin 15.7 (*)    HCT 50.2 (*)    Eosinophils Absolute 1.3 (*)    Basophils Absolute 0.2 (*)    All other components within normal limits  CULTURE, BLOOD (ROUTINE X 2)  CULTURE, BLOOD (ROUTINE X 2)  TROPONIN I  MAGNESIUM  I-STAT CG4 LACTIC ACID, ED    EKG  EKG Interpretation  Date/Time:  Monday September 20 2017 07:24:53 EST Ventricular Rate:  91 PR Interval:    QRS Duration: 95 QT Interval:  409 QTC Calculation: 504 R Axis:   -45 Text Interpretation:  Sinus rhythm Atrial premature complex Inferior infarct, old Anterior infarct, old Prolonged QT interval Confirmed by Isla Pence [phone]) on 09/20/2017 8:00:25 AM       Radiology Dg Chest Port 1 View  Result Date: 09/20/2017 CLINICAL DATA:  Shortness of breath and wheezing. Personal history of pneumothorax. EXAM: PORTABLE CHEST 1 VIEW COMPARISON:  One-view chest x-ray 08/05/2017. FINDINGS: Heart size is normal. Aortic atherosclerosis is present. Mild pulmonary vascular congestion is present without edema. Mild bibasilar airspace disease is slightly worse on the left. There is no pneumothorax. A small left pleural effusion is not excluded. IMPRESSION: 1. Asymmetric mild left basilar airspace disease and possible effusion. Question pneumonia. 2. No pneumothorax. 3. Mild pulmonary vascular congestion. 4.  Aortic Atherosclerosis (ICD10-I70.0). Electronically Signed   By: San Morelle M.D.   On: 09/20/2017 07:45    Procedures Procedures (including critical care time)  Medications Ordered in ED Medications  levofloxacin (LEVAQUIN) IVPB 750 mg (750 mg Intravenous New Bag/Given 09/20/17 0820)  acetaminophen (TYLENOL) suppository 650 mg (not administered)  potassium chloride 10 mEq in 100 mL IVPB (not administered)  albuterol (PROVENTIL) (2.5  MG/3ML) 0.083% nebulizer solution 5 mg (5 mg Nebulization Given 09/20/17 0713)  methylPREDNISolone sodium succinate (SOLU-MEDROL) 125 mg/2 mL injection 125 mg (125 mg Intravenous Given 09/20/17 0756)     Initial Impression / Assessment and Plan / ED Course  I have reviewed the triage vital signs and the nursing notes.  Pertinent labs & imaging results that were available during my care of the patient were reviewed by me and considered in my medical decision making (see chart for details).  CRITICAL CARE Performed by: Isla Pence   Total critical care time: 30 minutes  Critical care time was exclusive of separately billable procedures and treating other patients.  Critical care was necessary to treat or prevent imminent or life-threatening deterioration.  Critical care was time spent personally by me on the following activities: development of treatment plan with patient and/or surrogate as well as nursing, discussions with consultants, evaluation of patient's response to treatment, examination of patient, obtaining history from patient or surrogate, ordering and performing treatments and interventions, ordering and review of laboratory studies, ordering and review of radiographic studies, pulse oximetry and re-evaluation of patient's condition.    Bipap, albuterol, and solumedrol has significantly improved pt's breathing.  She looks much better.  She does have pna on cxr and has been on cefuroxime, so I put her on levaquin.  Pt is hypokalemic, so she was given IV potassium.  Magnesium level ordered.  Pt d/w hospitalist (Dr. Roderic Palau) for admission.  Final Clinical Impressions(s) / ED Diagnoses   Final diagnoses:  COPD exacerbation ([place])  Acute respiratory failure with hypoxia [facility])  Hypokalemia    ED Discharge Orders    None       Isla Pence, MD 09/20/17 [phone]

## 2017-09-20 NOTE — H&P (Signed)
History and Physical    Marie [name] JKK:[id number] DOB: December 20, 1935 DOA: 09/20/2017  PCP: [name], MD  Patient coming from: Home  I have personally briefly reviewed patient's old medical records in [place]  Chief Complaint: Shortness of breath  HPI: Marie [name] is a 81 y.o. female with medical history significant of oxygen and steroid-dependent COPD, chronic respiratory failure, presents to the hospital complains of shortness of breath.  Patient complains with 2-week history of progressive shortness of breath.  She has had productive cough, no fever.  She has had increasing wheezing.  She has been using her nebulizer without significant benefit.  She has not had any chest pain, vomiting, diarrhea.  She has not had any sick contacts.  She denies any myalgias or sinus congestion.  ED Course: On arrival to the emergency room, she was initially on BiPAP and received albuterol treatments.  She was noted to be 70% on 2 L.  After receiving albuterol treatment and steroids, respiratory status improved.  She was taken off BiPAP but remains significantly short of breath.  Chest x-ray indicated possible pneumonia.  She was also hypokalemic with a potassium of 2.9.  She has been referred for admission.  Review of Systems: As per HPI otherwise 10 point review of systems negative.    Past Medical History:  Diagnosis Date  . Balance disorder 2008.     Falls a lot.  She can be standing and then leans too far over to one side   . Bulging disc   . Chronic bronchitis ([place])    due to congestion at times, on prednisone and advair  . COPD (chronic obstructive pulmonary disease) ([place])   . Depression    many years ago  . GERD (gastroesophageal reflux disease)   . Hearing loss    mild  . Hyperlipidemia   . Hypertension   . Iatrogenic adrenal insufficiency ([place])   . Incontinence    not indicated at this visit.  Marland Kitchen LVH (left ventricular hypertrophy) due to hypertensive disease    a. 02/2017: echo showing an EF of 65-70% with moderate LVH, hyperdynamic LV with subvalvular gradient of 70 mmHg, SAM, and mild MR  . Neuropathy    legs stay numb   . Osteoarthritis    hands,   . Osteoporosis   . Shortness of breath dyspnea   . Tubulovillous adenoma of rectum   . Wears dentures   . Wears glasses     Past Surgical History:  Procedure Laterality Date  . ABDOMINAL HYSTERECTOMY    . APPENDECTOMY    . CHONDROPLASTY  09/19/2014   Procedure: CHONDROPLASTY;  Surgeon: Alta Corning, MD;  Location: [place];  Service: Orthopedics;;  . DILATION AND CURETTAGE OF UTERUS    . EXTERNAL FIXATION LEG  10/25/2012   Procedure: EXTERNAL FIXATION LEG;  Surgeon: Rozanna Box, MD;  Location: [place];  Service: Orthopedics;  Laterality: Right;  . EYE SURGERY     bilateral cataract surgery and lens implant  . FINGER SURGERY     fusions and debridements for OA  . INCONTINENCE SURGERY     multiple procedures, not cured  . KNEE ARTHROSCOPY WITH LATERAL MENISECTOMY Right 09/19/2014   Procedure: KNEE ARTHROSCOPY WITH LATERAL MENISECTOMY;  Surgeon: Alta Corning, MD;  Location: [place];  Service: Orthopedics;  Laterality: Right;  . KNEE ARTHROSCOPY WITH MEDIAL MENISECTOMY Right 09/19/2014   Procedure: RIGHT KNEE ARTHROSCOPY WITH MEDIAL AND LATERAL MENISECTOMIES. CHONDROPLASTY  OF PATELLA-FEMORAL JOINT;  Surgeon: Alta Corning, MD;  Location: [place];  Service: Orthopedics;  Laterality: Right;  . RECTAL BIOPSY  09/21/2011   Procedure: BIOPSY RECTAL;  Surgeon: Merrie Roof, MD;  Location: [place];  Service: General;  Laterality: N/A;  3-4 cm  . RECTAL SURGERY     by dr. Marlou Starks, removal of polyp  . TONSILLECTOMY       reports that  has never smoked. she has never used smokeless tobacco. She reports that she does not drink alcohol or use drugs.  Allergies  Allergen Reactions  . Ertapenem Hives    Caused whole body to turn red Other reaction(s):  Redness Caused whole body to turn red  . Codeine Other (See Comments)    hallucinations  . Demerol Other (See Comments)    hallucinations  . Morphine And Related Other (See Comments)    hallucinations  . Other Other (See Comments)    Invan 7- pt became red all over  . Sulfate Other (See Comments)    unknown    Family History  Problem Relation Age of Onset  . Cancer Sister        pt unaware of what kind  . High blood pressure Mother   . COPD Mother   . Heart attack Father     Prior to Admission medications   Medication Sig Start Date End Date Taking? Authorizing Provider  albuterol (PROVENTIL) (2.5 MG/3ML) 0.083% nebulizer solution Use 1 ampule in nebulizer 4 times a day or every 4 hours. Patient taking differently: Take 2.5 mg by nebulization every 6 (six) hours as needed. Use 1 ampule in nebulizer 4 times a day or every 4 hours. 05/13/17  Yes [name], MD  aspirin 81 MG tablet Take 81 mg by mouth daily. Buys OTC   Yes [provider]  budesonide (PULMICORT) 0.5 MG/2ML nebulizer solution Take 2 mLs (0.5 mg total) by nebulization 2 (two) times daily. Dx-J45.909, J44.0, J20.9 05/13/17  Yes [name], MD  carbidopa-levodopa (SINEMET IR) 25-100 MG tablet Take 1 tablet by mouth 3 (three) times daily. 08/09/17  Yes [name], MD  Cholecalciferol (VITAMIN D) 2000 units CAPS Take 1 capsule by mouth daily.   Yes [provider]  Fluticasone-Salmeterol (ADVAIR DISKUS) 250-50 MCG/DOSE AEPB Inhale 1 puff into the lungs 2 (two) times daily. 05/14/17  Yes [name], MD  furosemide (LASIX) 40 MG tablet Take 1 tablet daily for fluid retention or an=kle swelling 09/13/17 04/13/18 Yes [name], MD  olmesartan [facility]) 20 MG tablet Take 1 tablet daily for BP (replaces Losartan) 09/03/17 04/03/18 Yes [name], MD  omeprazole (PRILOSEC) 40 MG capsule Take 1 capsule daily for Acid Reflux 09/13/17 04/13/18 Yes [name], MD  pravastatin  (PRAVACHOL) 40 MG tablet Take 40 mg by mouth daily.    Yes [provider]  predniSONE (DELTASONE) 10 MG tablet Take 10 mg by mouth daily with breakfast.   Yes [provider]  PROCTOZONE-HC 2.5 % rectal cream Apply 1 application topically 2 (two) times daily. 08/30/17  Yes [provider]  sodium chloride HYPERTONIC 3 % nebulizer solution Take 1 vial by nebulization BID 02/24/17  Yes Juanito Doom, MD  benzonatate (TESSALON) 100 MG capsule TAKE ONE CAPSULE BY MOUTH THREE TIMES DAILY AS NEEDED FOR  COUGH Patient not taking: Reported on 09/20/2017 07/13/17   [name], MD    Physical Exam: Vitals:   09/20/17 1600 09/20/17 1627 09/20/17 1700 09/20/17  1800  BP: 116/84  117/81 126/60  Pulse: 93 93 97 89  Resp: (!) 27 (!) 25 (!) 22 (!) 25  Temp:  98.9 F (37.2 C)    TempSrc:  Oral    SpO2: 94% 92% 100% 93%  Weight:      Height:        Constitutional: NAD, calm, comfortable Vitals:   09/20/17 1600 09/20/17 1627 09/20/17 1700 09/20/17 1800  BP: 116/84  117/81 126/60  Pulse: 93 93 97 89  Resp: (!) 27 (!) 25 (!) 22 (!) 25  Temp:  98.9 F (37.2 C)    TempSrc:  Oral    SpO2: 94% 92% 100% 93%  Weight:      Height:       Eyes: PERRL, lids and conjunctivae normal ENMT: Mucous membranes are moist. Posterior pharynx clear of any exudate or lesions.Normal dentition.  Neck: normal, supple, no masses, no thyromegaly Respiratory: Diminished breath sounds with wheezing bilaterally.  Increased respiratory effort. Cardiovascular: Regular rate and rhythm, no murmurs / rubs / gallops. No extremity edema. 2+ pedal pulses. No carotid bruits.  Abdomen: no tenderness, no masses palpated. No hepatosplenomegaly. Bowel sounds positive.  Musculoskeletal: no clubbing / cyanosis. No joint deformity upper and lower extremities. Good ROM, no contractures. Normal muscle tone.  Skin: no rashes, lesions, ulcers. No induration Neurologic: CN 2-12 grossly intact. Sensation intact,  DTR normal. Strength 5/5 in all 4.  Psychiatric: Normal judgment and insight. Alert and oriented x 3. Normal mood.   Labs on Admission: I have personally reviewed following labs and imaging studies  CBC: Recent Labs  Lab 09/20/17 0727  WBC 9.4  NEUTROABS 5.4  HGB 15.7*  HCT 50.2*  MCV 92.6  PLT 540   Basic Metabolic Panel: Recent Labs  Lab 09/20/17 0727 09/20/17 0755  NA 141  --   K 2.9*  --   CL 101  --   CO2 31  --   GLUCOSE 102*  --   BUN 22*  --   CREATININE 1.04*  --   CALCIUM 9.3  --   MG  --  2.2   GFR: Estimated Creatinine Clearance: 37.9 mL/min (A) (by C-G formula based on SCr of 1.04 mg/dL (H)). Liver Function Tests: No results for input(s): AST, ALT, ALKPHOS, BILITOT, PROT, ALBUMIN in the last 168 hours. No results for input(s): LIPASE, AMYLASE in the last 168 hours. No results for input(s): AMMONIA in the last 168 hours. Coagulation Profile: No results for input(s): INR, PROTIME in the last 168 hours. Cardiac Enzymes: Recent Labs  Lab 09/20/17 0727  TROPONINI <0.03   BNP (last 3 results) No results for input(s): PROBNP in the last 8760 hours. HbA1C: No results for input(s): HGBA1C in the last 72 hours. CBG: No results for input(s): GLUCAP in the last 168 hours. Lipid Profile: No results for input(s): CHOL, HDL, LDLCALC, TRIG, CHOLHDL, LDLDIRECT in the last 72 hours. Thyroid Function Tests: No results for input(s): TSH, T4TOTAL, FREET4, T3FREE, THYROIDAB in the last 72 hours. Anemia Panel: No results for input(s): VITAMINB12, FOLATE, FERRITIN, TIBC, IRON, RETICCTPCT in the last 72 hours. Urine analysis:    Component Value Date/Time   COLORURINE YELLOW 02/17/2017 [place] 02/17/2017 1229   LABSPEC 1.015 02/17/2017 1229   PHURINE 6.5 02/17/2017 1229   GLUCOSEU NEGATIVE 02/17/2017 1229   HGBUR TRACE (A) 02/17/2017 1229   BILIRUBINUR NEGATIVE 02/17/2017 1229   KETONESUR NEGATIVE 02/17/2017 1229   PROTEINUR NEGATIVE 02/17/2017  1229  UROBILINOGEN 0.2 04/16/2015 2310   NITRITE NEGATIVE 02/17/2017 1229   LEUKOCYTESUR NEGATIVE 02/17/2017 1229    Radiological Exams on Admission: Dg Chest Port 1 View  Result Date: 09/20/2017 CLINICAL DATA:  Shortness of breath and wheezing. Personal history of pneumothorax. EXAM: PORTABLE CHEST 1 VIEW COMPARISON:  One-view chest x-ray 08/05/2017. FINDINGS: Heart size is normal. Aortic atherosclerosis is present. Mild pulmonary vascular congestion is present without edema. Mild bibasilar airspace disease is slightly worse on the left. There is no pneumothorax. A small left pleural effusion is not excluded. IMPRESSION: 1. Asymmetric mild left basilar airspace disease and possible effusion. Question pneumonia. 2. No pneumothorax. 3. Mild pulmonary vascular congestion. 4.  Aortic Atherosclerosis (ICD10-I70.0). Electronically Signed   By: San Morelle M.D.   On: 09/20/2017 07:45    EKG: Independently reviewed.  Sinus rhythm without acute changes.  Assessment/Plan Active Problems:   Hyperlipidemia   GERD (gastroesophageal reflux disease)   Hypokalemia   Essential hypertension   Restrictive lung disease   Parkinson's plus syndrome (HCC)   COPD with chronic bronchitis (HCC)   CAP (community acquired pneumonia)   Acute on chronic respiratory failure with hypoxia ([place])    1. Acute on chronic respiratory failure with hypoxia.  Patient is chronically on 2 L of oxygen.  On admission, due to worsening shortness of breath she required BiPAP therapy.  Respiratory failure is likely related to pneumonia in the setting of COPD exacerbation.  Will try and wean down oxygen as tolerated. 2. Pneumonia.  Patient was being treated with cefuroxime as an outpatient.  Will continue on IV Levaquin for now.  Continue pulmonary hygiene. 3. COPD exacerbation.  Patient has a history of oxygen and prednisone dependent COPD.  Will treat with intravenous steroids for now.  Continue antibiotics.  Continue  bronchodilators and pulmonary hygiene. 4. Parkinson plus syndrome.  Continue on Sinemet. 5. Hypokalemia.  Possibly related to frequent albuterol.  Will replace potassium.  Magnesium is normal. 6. GERD.  Continue on PPI 7. Hyperlipidemia.  Continue statin  DVT prophylaxis: lovenox Code Status: full code Family Communication: discussed with husband and daughter at the bedside Disposition Plan: discharge home once improved Consults called:  Admission status: inpatient, stepdown   Kathie Dike MD [facility] Pager [phone]  If 7PM-7AM, please contact night-coverage [email or web address] Password [email or web address]  09/20/2017, 7:01 PM

## 2017-09-20 NOTE — ED Notes (Signed)
EKG given to [facility] MD

## 2017-09-20 NOTE — ED Notes (Signed)
Family at bedside. 

## 2017-09-20 NOTE — Progress Notes (Signed)
While giving patient night meds, patient had complications swallowing K+ packets mixed in her juice. She also had complications swallowing her robitussin. Patient states that she has problems swallowing. Patient coughed a few times after swallowing liquid medications. Patient decided to not take robitussin, stating "Ive coughed for years." Next K+ dose will be mixed in applesauce which she swallows well.   Will pass information on to next shift. And continue to monitor.

## 2017-09-21 ENCOUNTER — Ambulatory Visit: Payer: Self-pay | Admitting: Internal Medicine

## 2017-09-21 ENCOUNTER — Inpatient Hospital Stay (HOSPITAL_COMMUNITY): Payer: Medicare Other

## 2017-09-21 ENCOUNTER — Other Ambulatory Visit: Payer: Self-pay | Admitting: Internal Medicine

## 2017-09-21 LAB — CBC
HEMATOCRIT: 44.6 % (ref 36.0–46.0)
HEMOGLOBIN: 13.9 g/dL (ref 12.0–15.0)
MCH: 28.5 pg (ref 26.0–34.0)
MCHC: 31.2 g/dL (ref 30.0–36.0)
MCV: 91.6 fL (ref 78.0–100.0)
Platelets: 222 10*3/uL (ref 150–400)
RBC: 4.87 MIL/uL (ref 3.87–5.11)
RDW: 14 % (ref 11.5–15.5)
WBC: 6.9 10*3/uL (ref 4.0–10.5)

## 2017-09-21 LAB — BASIC METABOLIC PANEL
Anion gap: 6 (ref 5–15)
BUN: 25 mg/dL — ABNORMAL HIGH (ref 6–20)
CALCIUM: 8.8 mg/dL — AB (ref 8.9–10.3)
CHLORIDE: 106 mmol/L (ref 101–111)
CO2: 25 mmol/L (ref 22–32)
CREATININE: 0.92 mg/dL (ref 0.44–1.00)
GFR calc non Af Amer: 58 mL/min — ABNORMAL LOW (ref >60)
GLUCOSE: 169 mg/dL — AB (ref 65–99)
Potassium: 3.9 mmol/L (ref 3.5–5.1)
Sodium: 137 mmol/L (ref 135–145)

## 2017-09-21 MED ORDER — PREDNISONE 10 MG PO TABS: 10.0000 mg | ORAL_TABLET | Freq: Every day | ORAL | 0 refills | Status: DC

## 2017-09-21 MED ORDER — PREDNISONE 10 MG PO TABS: 10.0000 mg | ORAL_TABLET | Freq: Every day | ORAL | 1 refills | Status: DC

## 2017-09-21 NOTE — Progress Notes (Signed)
Speech Pathology  SLP consult received. Chart reviewed and d/w RN. Pt had MBSS as an outpatient last February at [facility] and was noted to have penetration of thins without aspiration, but did cough during study. Given, current PNA and recent c/o dysphagia with solids and increased coughing with po intake, will proceed with MBSS when radiology can accommodate.   Thank you,  Genene Churn, CCC-SLP [phone]

## 2017-09-21 NOTE — Progress Notes (Signed)
Modified Barium Swallow Progress Note  Patient Details  Name: Marie [name] MRN: [id number] Date of Birth: 1936-09-12  Today's Date: 09/21/2017  Modified Barium Swallow completed.  Full report located under Chart Review in the Imaging Section.  Brief recommendations include the following:  Clinical Impression  Pt presents with mod/severe oral delays characterized by adequate mastication of solids, lingual pumping, decreased posterior propulsion of bolus with holding of all textures and consistencies for up to 2 minutes before Pt able to propel over base of tongue. Pharyngeal phase is essentially WNL. Pt with flash penetration during the swallow with thin and nectar-thick liquids which was immediately expelled. Pt also presented with cough after many of her swallows once triggered, however suspect this is due to Pt attempting to coordinate respiration with swallow (Pt holding breath frequently when attempting to initiate swallow, then swallows, gasps and coughs). No aspiration observed over the course of the study. Swallow deficits could be consistent with PD/PSP with difficulty initiating swallow. SLP spoke with Pt and family at length regarding swallowing evaluation and recommendations. Pt seen in room with dinner meal and she had consumed nearly 50% of her meal prior to my arrival. Pt has lost a significant amount of weight in the past few months per family. Recommend continuing to offer regular textures and all liquids (using straw currently seems more efficient, try providing distractions from meal like watching TV, continue aspiration and reflux precautions). Pt reports inconsistent consumption of taking her medications. SLP encouraged Pt/family for Pt to take Sinemet as prescribed by Dr. Posey Pronto as this may help her swallowing (oral initiation). SLP will follow x1 during acute stay. Above to RN.    Swallow Evaluation Recommendations       SLP Diet Recommendations: Regular solids;Thin  liquid   Liquid Administration via: Cup;Straw   Medication Administration: Whole meds with puree   Supervision: Patient able to self feed;Intermittent supervision to cue for compensatory strategies       Postural Changes: Remain semi-upright after after feeds/meals (Comment);Seated upright at 90 degrees   Oral Care Recommendations: Oral care BID   Other Recommendations: Clarify dietary restrictions   Thank you,  Genene Churn, [place]  PORTER,DABNEY 09/21/2017,7:00 PM

## 2017-09-21 NOTE — Progress Notes (Signed)
Upon giving pt last dose of K+ in applesauce, she threw it up. Pt threw up large amount of orange content (applesauce, and K+ included). Patient stated she just cant tolerate the K+ and that it is making her sick. RN paged MD for request for SLP to see patient. RN believes patient may be aspirating.  Margaret Pyle, RN

## 2017-09-21 NOTE — Progress Notes (Signed)
PROGRESS NOTE    Marie [name]  KYH:[id number] DOB: 1936-09-09 DOA: 09/20/2017 PCP: [name], MD    Brief Narrative:  81 year old female chronic respiratory failure on home oxygen, presented to the hospital with worsening shortness of breath and cough.  Found to have possible pneumonia.  She initially required BiPAP and was admitted to the stepdown unit.  Started on intravenous antibiotics, steroids and bronchodilators.  Overall condition is slowly improving.   Assessment & Plan:   Active Problems:   Hyperlipidemia   GERD (gastroesophageal reflux disease)   Hypokalemia   Essential hypertension   Restrictive lung disease   Parkinson's plus syndrome ([place])   COPD with chronic bronchitis ([place])   CAP (community acquired pneumonia)   Acute on chronic respiratory failure with hypoxia ([place])   1. Acute on chronic respiratory failure with hypoxia.  Patient is chronically on 2 L of oxygen.  On admission, due to worsening shortness of breath she required BiPAP therapy.  Respiratory failure is likely related to pneumonia in the setting of COPD exacerbation.    She has since been weaned back down to nasal cannula.. 2. Pneumonia.  Patient was being treated with cefuroxime as an outpatient, but symptoms did not improve.    She has been started on IV Levaquin for now.  Continue pulmonary hygiene.  Continue current treatments 3. COPD exacerbation.  Patient has a history of oxygen and prednisone dependent COPD.  Will treat with intravenous steroids for now.  Continue antibiotics.  Continue bronchodilators and pulmonary hygiene.  Overall respiratory status and wheezing is improving 4. Parkinson plus syndrome. Probable supranuclear palsy.  Continue on Sinemet. 5. Oropharyngeal dysphasia.  Possible related to #4.  Patient have a significant difficulty with any sort of medication/oral intake.  Speech therapy involved. 6. Hypokalemia.  Possibly related to frequent albuterol.    Replaced. Magnesium is  normal. 7. GERD.  Continue on PPI 8. Hyperlipidemia.  Continue statin   DVT prophylaxis: Lovenox Code Status: Full code Family Communication: No family present Disposition Plan: Discharge home once improved   Consultants:   Speech therapy  Procedures:     Antimicrobials:   Levaquin 12/3 >   Subjective: Feels breathing is improving.  Still has productive cough.  Objective: Vitals:   09/21/17 0900 09/21/17 1000 09/21/17 1100 09/21/17 1200  BP: (!) 117/57 (!) 116/58 115/61 (!) 105/52  Pulse: 85 93 92 94  Resp: (!) 25 (!) 24 (!) 22 (!) 24  Temp: 98 F (36.7 C)     TempSrc: Oral     SpO2: 92% 95% 95% 96%  Weight:      Height:        Intake/Output Summary (Last 24 hours) at 09/21/2017 1833 Last data filed at 09/21/2017 1600 Gross per 24 hour  Intake 640 ml  Output -  Net 640 ml   Filed Weights   09/20/17 0705 09/20/17 1205  Weight: 59.9 kg (132 lb) 56.9 kg (125 lb 7.1 oz)    Examination:  General exam: Appears calm and comfortable  Respiratory system: Clear to auscultation.  Improving respiratory effort Cardiovascular system: S1 & S2 heard, RRR. No JVD, murmurs, rubs, gallops or clicks. No pedal edema. Gastrointestinal system: Abdomen is nondistended, soft and nontender. No organomegaly or masses felt. Normal bowel sounds heard. Central nervous system: Alert and oriented. No focal neurological deficits. Extremities: Symmetric 5 x 5 power. Skin: No rashes, lesions or ulcers Psychiatry: Judgement and insight appear normal. Mood & affect appropriate.     Data Reviewed:  I have personally reviewed following labs and imaging studies  CBC: Recent Labs  Lab 09/20/17 0727 09/21/17 0306  WBC 9.4 6.9  NEUTROABS 5.4  --   HGB 15.7* 13.9  HCT 50.2* 44.6  MCV 92.6 91.6  PLT 225 606   Basic Metabolic Panel: Recent Labs  Lab 09/20/17 0727 09/20/17 0755 09/21/17 0306  NA 141  --  137  K 2.9*  --  3.9  CL 101  --  106  CO2 31  --  25  GLUCOSE 102*  --   169*  BUN 22*  --  25*  CREATININE 1.04*  --  0.92  CALCIUM 9.3  --  8.8*  MG  --  2.2  --    GFR: Estimated Creatinine Clearance: 42.8 mL/min (by C-G formula based on SCr of 0.92 mg/dL). Liver Function Tests: No results for input(s): AST, ALT, ALKPHOS, BILITOT, PROT, ALBUMIN in the last 168 hours. No results for input(s): LIPASE, AMYLASE in the last 168 hours. No results for input(s): AMMONIA in the last 168 hours. Coagulation Profile: No results for input(s): INR, PROTIME in the last 168 hours. Cardiac Enzymes: Recent Labs  Lab 09/20/17 0727  TROPONINI <0.03   BNP (last 3 results) No results for input(s): PROBNP in the last 8760 hours. HbA1C: No results for input(s): HGBA1C in the last 72 hours. CBG: No results for input(s): GLUCAP in the last 168 hours. Lipid Profile: No results for input(s): CHOL, HDL, LDLCALC, TRIG, CHOLHDL, LDLDIRECT in the last 72 hours. Thyroid Function Tests: No results for input(s): TSH, T4TOTAL, FREET4, T3FREE, THYROIDAB in the last 72 hours. Anemia Panel: No results for input(s): VITAMINB12, FOLATE, FERRITIN, TIBC, IRON, RETICCTPCT in the last 72 hours. Sepsis Labs: Recent Labs  Lab 09/20/17 0732  LATICACIDVEN 1.65    Recent Results (from the past 240 hour(s))  Culture, blood (routine x 2)     Status: None (Preliminary result)   Collection Time: 09/20/17  7:28 AM  Result Value Ref Range Status   Specimen Description BLOOD LEFT ARM  Final   Special Requests   Final    BOTTLES DRAWN AEROBIC AND ANAEROBIC Blood Culture adequate volume   Culture NO GROWTH 1 DAY  Final   Report Status PENDING  Incomplete  Culture, blood (routine x 2)     Status: None (Preliminary result)   Collection Time: 09/20/17  7:45 AM  Result Value Ref Range Status   Specimen Description BLOOD LEFT HAND  Final   Special Requests   Final    BOTTLES DRAWN AEROBIC ONLY Blood Culture adequate volume   Culture NO GROWTH 1 DAY  Final   Report Status PENDING  Incomplete    MRSA PCR Screening     Status: Abnormal   Collection Time: 09/20/17 12:02 PM  Result Value Ref Range Status   MRSA by PCR POSITIVE (A) NEGATIVE Final    Comment: HEARN,J AT 1910 ON 12.3.2018 BY ISLEY,B        The GeneXpert MRSA Assay (FDA approved for NASAL specimens only), is one component of a comprehensive MRSA colonization surveillance program. It is not intended to diagnose MRSA infection nor to guide or monitor treatment for MRSA infections.          Radiology Studies: Dg Chest Port 1 View  Result Date: 09/20/2017 CLINICAL DATA:  Shortness of breath and wheezing. Personal history of pneumothorax. EXAM: PORTABLE CHEST 1 VIEW COMPARISON:  One-view chest x-ray 08/05/2017. FINDINGS: Heart size is normal. Aortic atherosclerosis is present.  Mild pulmonary vascular congestion is present without edema. Mild bibasilar airspace disease is slightly worse on the left. There is no pneumothorax. A small left pleural effusion is not excluded. IMPRESSION: 1. Asymmetric mild left basilar airspace disease and possible effusion. Question pneumonia. 2. No pneumothorax. 3. Mild pulmonary vascular congestion. 4.  Aortic Atherosclerosis (ICD10-I70.0). Electronically Signed   By: San Morelle M.D.   On: 09/20/2017 07:45        Scheduled Meds: . aspirin  81 mg Oral Daily  . carbidopa-levodopa  1 tablet Oral TID  . Chlorhexidine Gluconate Cloth  6 each Topical Q0600  . enoxaparin (LOVENOX) injection  40 mg Subcutaneous Q24H  . feeding supplement (ENSURE ENLIVE)  237 mL Oral BID BM  . guaiFENesin  15 mL Oral QID  . ipratropium-albuterol  3 mL Nebulization Q6H  . irbesartan  150 mg Oral Daily  . methylPREDNISolone (SOLU-MEDROL) injection  60 mg Intravenous Q6H  . mometasone-formoterol  2 puff Inhalation BID  . mupirocin ointment  1 application Nasal BID  . pantoprazole  40 mg Oral Daily  . pravastatin  40 mg Oral q1800  . sodium chloride flush  3 mL Intravenous Q12H  . sodium  chloride HYPERTONIC  4 mL Nebulization BID   Continuous Infusions: . sodium chloride    . levofloxacin (LEVAQUIN) IV Stopped (09/21/17 1159)     LOS: 1 day    Time spent: 17mins    Kathie Dike, MD [facility] Pager [phone]  If 7PM-7AM, please contact night-coverage [email or web address] Password [facility] 09/21/2017, 6:33 PM

## 2017-09-21 NOTE — Plan of Care (Signed)
Patient experiences SOB with activity and while eating. Pt takes rest periods appropriately. Difficulty swallowing per pt. Order received for SLP eval in morning. K+ PO doses not tolerated.

## 2017-09-21 NOTE — Progress Notes (Signed)
Initial Nutrition Assessment  DOCUMENTATION CODES:  Severe malnutrition in context of chronic illness  INTERVENTION:  Will order appropriate supplements pending SLP eval.   Gave brief nutritional education on ideal eating pattern to help her maintain weight. Explained chronic wt loss likely related to immense respiratory effort  NUTRITION DIAGNOSIS:  Severe Malnutrition related to decreased appetite, early satiety, poor appetite, catabolic illness(COPD) as evidenced by an energy intake of </= 75% of estimated requirements for >/= 1 month, loss of >20% bw x 1 year and >15% x 6 months  GOAL:  Patient will meet greater than or equal to 90% of their needs  MONITOR:  PO intake, Supplement acceptance, Diet advancement, Labs, Weight trends, I & O's  REASON FOR ASSESSMENT:  Malnutrition Screening Tool    ASSESSMENT:  81 y/o female PMHx O2/Steroid dependent COPD, Chronic respiratory failure, HTN/HLD, Depression. Presented with progressively worsening SOB x 2 weeks. Has had productive cough, wheezing. Work up revealed hypokalemia, possible PNA and was admitted for Acute on chronic resp failure related to PNA/COPD exacerbation.   Patient seen along with her husband who entered later. When asked her how she has been eating, she states "I eat, but I dont eat much". She says she eats 3x a day. To determine meal sizes, RD asked for examples of her meals. She says her breakfast is "a bite or two of cereal". Her lunch is "a bite or two of whatever he (husband) makes me". Would sound her meals are no more than a couple bites, which her husband corroborates. She says this has been going on for 3 months. She has nutritional supplements, but she doesn't drink them; neither husband nor pt know why she doesn't drink them. She likes them, just doesn't do it. She takes an Museum/gallery conservator.   Other symptoms include chronic constipation and dysphagia; "I have always had trouble swallowing". She says this is worse with solids  than liquids. RN noted multiple issues of coughing with intake and pt now has ST eval pending. Will hold ordering supplements until appropriate diet is specified.   Regarding her weight, she says she has lost weight. She says she was 170 lbs 3 months ago? RD asked her husband if he felt the pt lost weight and he said "oh, yeah". Per chart hx, pt has had progressive, significant wt loss. She was 161 lbs at this time last year. This is a loss of 35 lbs (22%) x 1 year. She was 148.6 lbs 6 months ago. This is a clinically significant loss of 23 lbs x 6 months (15.5% bw).   Physical exam reveals moderate muscle/fat wasting (see below)  RD discussed with pt and husband that her progressive wt loss is likely due to a poor appetite and a marked increased energy expenditure, both of which are related to her chronic respiratory failure. Explained how her body is prioritizing breathing over eating. Stated that she needs to eats small amounts frequently, ideally she should consume 5-6 small meals/day. She needs to make the most of each bite and consume high kcal/ pro foods. Greatly encouraged her to start consuming her Ensures she has.   Meets severe mal criteria with sig wt loss and eating only bites of food at meals.   Labs: Glu: 169, K has been corrected Meds: Sinemet, Ensure Enlive, Methylprednisolone, ppi, IV abx  Recent Labs  Lab 09/20/17 0727 09/20/17 0755 09/21/17 0306  NA 141  --  137  K 2.9*  --  3.9  CL 101  --  106  CO2 31  --  25  BUN 22*  --  25*  CREATININE 1.04*  --  0.92  CALCIUM 9.3  --  8.8*  MG  --  2.2  --   GLUCOSE 102*  --  169*    NUTRITION - FOCUSED PHYSICAL EXAM:   Most Recent Value  Orbital Region  Moderate depletion  Upper Arm Region  Moderate depletion  Thoracic and Lumbar Region  Moderate depletion  Buccal Region  Mild depletion  Temple Region  Mild depletion  Clavicle Bone Region  Moderate depletion  Clavicle and Acromion Bone Region  Moderate depletion   Scapular Bone Region  Unable to assess  Dorsal Hand  Mild depletion  Patellar Region  Moderate depletion  Anterior Thigh Region  Mild depletion  Posterior Calf Region  Mild depletion  Edema (RD Assessment)  None       Diet Order:  Diet Heart Room service appropriate? Yes; Fluid consistency: Thin  EDUCATION NEEDS:  Education needs have been addressed  Skin:  Skin Assessment: Reviewed RN Assessment  Last BM:  12/2  Height:  Ht Readings from Last 1 Encounters:  09/20/17 5\' 4"  (1.626 m)   Weight:  Wt Readings from Last 1 Encounters:  09/20/17 125 lb 7.1 oz (56.9 kg)   Wt Readings from Last 10 Encounters:  09/20/17 125 lb 7.1 oz (56.9 kg)  08/09/17 128 lb 12.8 oz (58.4 kg)  08/05/17 125 lb (56.7 kg)  07/26/17 125 lb 12.8 oz (57.1 kg)  07/20/17 129 lb (58.5 kg)  07/12/17 131 lb (59.4 kg)  06/11/17 133 lb 3 oz (60.4 kg)  05/25/17 136 lb (61.7 kg)  04/26/17 142 lb 6.4 oz (64.6 kg)  04/12/17 138 lb 6.4 oz (62.8 kg)   Ideal Body Weight:  54.54 kg  BMI:  Body mass index is 21.53 kg/m.  Estimated Nutritional Needs:  Kcal:  1700-1950 (30-34 kcal/kg bw) Protein:  75-85  g Pro (1.3-1.5 g/kg bw) Fluid:  1.4-1.7 L fluid (25-30 ml/kg)  Burtis Junes RD, LDN, CNSC Clinical Nutrition Pager: [phone] 09/21/2017 10:59 AM

## 2017-09-22 DIAGNOSIS — J449 Chronic obstructive pulmonary disease, unspecified: Secondary | ICD-10-CM

## 2017-09-22 DIAGNOSIS — K219 Gastro-esophageal reflux disease without esophagitis: Secondary | ICD-10-CM

## 2017-09-22 DIAGNOSIS — E876 Hypokalemia: Secondary | ICD-10-CM

## 2017-09-22 DIAGNOSIS — I10 Essential (primary) hypertension: Secondary | ICD-10-CM

## 2017-09-22 DIAGNOSIS — G232 Striatonigral degeneration: Secondary | ICD-10-CM

## 2017-09-22 DIAGNOSIS — J984 Other disorders of lung: Secondary | ICD-10-CM

## 2017-09-22 DIAGNOSIS — E782 Mixed hyperlipidemia: Secondary | ICD-10-CM

## 2017-09-22 DIAGNOSIS — J189 Pneumonia, unspecified organism: Principal | ICD-10-CM

## 2017-09-22 DIAGNOSIS — J9621 Acute and chronic respiratory failure with hypoxia: Secondary | ICD-10-CM

## 2017-09-22 MED ORDER — PREDNISONE 20 MG PO TABS: ORAL_TABLET | ORAL | 0 refills | Status: DC

## 2017-09-22 MED ORDER — ALBUTEROL SULFATE (2.5 MG/3ML) 0.083% IN NEBU: 2.5000 mg | INHALATION_SOLUTION | Freq: Four times a day (QID) | RESPIRATORY_TRACT | Status: DC | PRN

## 2017-09-22 MED ORDER — LEVOFLOXACIN 500 MG PO TABS: 500.0000 mg | ORAL_TABLET | Freq: Every day | ORAL | 0 refills | Status: AC

## 2017-09-22 NOTE — Discharge Summary (Signed)
Physician Discharge Summary  Marie [name] ZOX:[id number] DOB: September 01, 1936 DOA: 09/20/2017  PCP: [name], MD  Admit date: 09/20/2017 Discharge date: 09/22/2017  Admitted From: Home  Disposition: Home  Recommendations for Outpatient Follow-up:  1. Follow up with PCP in 1 weeks, Follow up with neurology as scheduled on 10/04/17 2. Please obtain BMP/CBC in one week 3. Please consider repeating chest xray in 4-6 weeks to ensure full resolution of pneumonia. 4. Please follow up on the following pending results: final culture results.   Discharge Condition: STABLE   CODE STATUS: FULL    Brief Hospitalization Summary: Please see all hospital notes, images, labs for full details of the hospitalization. HPI: Marie [name] is a 81 y.o. female with medical history significant of oxygen and steroid-dependent COPD, chronic respiratory failure, presents to the hospital complains of shortness of breath.  Patient complains with 2-week history of progressive shortness of breath.  She has had productive cough, no fever.  She has had increasing wheezing.  She has been using her nebulizer without significant benefit.  She has not had any chest pain, vomiting, diarrhea.  She has not had any sick contacts.  She denies any myalgias or sinus congestion.  ED Course: On arrival to the emergency room, she was initially on BiPAP and received albuterol treatments.  She was noted to be 70% on 2 L.  After receiving albuterol treatment and steroids, respiratory status improved.  She was taken off BiPAP but remains significantly short of breath.  Chest x-ray indicated possible pneumonia.  She was also hypokalemic with a potassium of 2.9.  She has been referred for admission.   1. Acute on chronic respiratory failure with hypoxia. Patient is chronically on 2 L of oxygen. On admission, due to worsening shortness of breath she required BiPAP therapy. Respiratory failure is likely related to pneumonia in the  setting of COPD exacerbation.   She has since been weaned back down to nasal cannula.  Discharge home today as she reports that she feels back to her baseline and reports that she is doing much better.  She does have oxygen at home.   2. Pneumonia. Pt reports improvement and wants to discharge home.  Patient was being treated with cefuroxime as an outpatient, but symptoms did not improve.   She has been started on IV Levaquin with improvement.  Discharge on 5 more days of oral levofloxacin. Continue pulmonary hygiene.   3. COPD exacerbation. Patient has a history of oxygen and prednisone dependent COPD. Treated with intravenous steroids and discharge on taper. Continue antibiotics. Treated with bronchodilators and pulmonary hygiene.  Overall respiratory status and wheezing is improved.   4. Parkinson plus syndrome. Probable supranuclear palsy.  Continue on Sinemet. 5. Oropharyngeal dysphasia.  Possible related to #4.  Patient have a significant difficulty with any sort of medication/oral intake.  Speech therapy involved.  Pt had MBS and SLP recommended regular diet, thin liquids.  6. Hypokalemia. Resolved now.  Possibly related to frequent albuterol.   Replaced.Magnesium is normal. 7. GERD. Continue on PPI 8. Hyperlipidemia. Continue statin  DVT prophylaxis: Lovenox Code Status: Full code Family Communication: No family present Disposition Plan: Discharge home once improved  Consultants:   Speech therapy  Discharge Diagnoses:  Principal Problem:   Acute on chronic respiratory failure with hypoxia ([place]) Active Problems:   CAP (community acquired pneumonia)   Hyperlipidemia   GERD (gastroesophageal reflux disease)   Hypokalemia   Essential hypertension   Restrictive lung disease   Parkinson's  plus syndrome (HCC)   COPD with chronic bronchitis [facility])  Discharge Instructions: Discharge Instructions    Call MD for:  difficulty breathing, headache or visual disturbances    Complete by:  As directed    Call MD for:  extreme fatigue   Complete by:  As directed    Call MD for:  persistant dizziness or light-headedness   Complete by:  As directed    Call MD for:  persistant nausea and vomiting   Complete by:  As directed    Call MD for:  severe uncontrolled pain   Complete by:  As directed    Increase activity slowly   Complete by:  As directed      Allergies as of 09/22/2017      Reactions   Ertapenem Hives   Caused whole body to turn red Other reaction(s): Redness Caused whole body to turn red   Codeine Other (See Comments)   hallucinations   Demerol Other (See Comments)   hallucinations   Morphine And Related Other (See Comments)   hallucinations   Other Other (See Comments)   Invan 7- pt became red all over   Sulfate Other (See Comments)   unknown      Medication List    STOP taking these medications   benzonatate 100 MG capsule Commonly known as:  TESSALON     TAKE these medications   albuterol (2.5 MG/3ML) 0.083% nebulizer solution Commonly known as:  PROVENTIL Take 3 mLs (2.5 mg total) by nebulization every 6 (six) hours as needed. Use 1 ampule in nebulizer 4 times a day or every 4 hours. What changed:    how much to take  how to take this  when to take this  reasons to take this   aspirin 81 MG tablet Take 81 mg by mouth daily. Buys OTC   budesonide 0.5 MG/2ML nebulizer solution Commonly known as:  PULMICORT Take 2 mLs (0.5 mg total) by nebulization 2 (two) times daily. Dx-J45.909, J44.0, J20.9   carbidopa-levodopa 25-100 MG tablet Commonly known as:  SINEMET IR Take 1 tablet by mouth 3 (three) times daily.   Fluticasone-Salmeterol 250-50 MCG/DOSE Aepb Commonly known as:  ADVAIR DISKUS Inhale 1 puff into the lungs 2 (two) times daily.   furosemide 40 MG tablet Commonly known as:  LASIX Take 1 tablet daily for fluid retention or an=kle swelling   levofloxacin 500 MG tablet Commonly known as:  LEVAQUIN Take 1  tablet (500 mg total) by mouth daily for 5 days. Start taking on:  09/23/2017   olmesartan 20 MG tablet Commonly known as:  BENICAR Take 1 tablet daily for BP (replaces Losartan)   omeprazole 40 MG capsule Commonly known as:  PRILOSEC Take 1 capsule daily for Acid Reflux   pravastatin 40 MG tablet Commonly known as:  PRAVACHOL Take 40 mg by mouth daily.   predniSONE 20 MG tablet Commonly known as:  DELTASONE Take 3 PO QAM x3days, 2 PO QAM x3days, 1 PO QAM x3days, then resume regular 10 mg QD Start taking on:  09/23/2017   PROCTOZONE-HC 2.5 % rectal cream Generic drug:  hydrocortisone Apply 1 application topically 2 (two) times daily.   sodium chloride HYPERTONIC 3 % nebulizer solution Take 1 vial by nebulization BID   Vitamin D 2000 units Caps Take 1 capsule by mouth daily.      Follow-up Information    [name], MD. Schedule an appointment as soon as possible for a visit in 1 week(s).  Specialty:  Internal Medicine Contact information: [address] [place] [address] [phone]          Allergies  Allergen Reactions  . Ertapenem Hives    Caused whole body to turn red Other reaction(s): Redness Caused whole body to turn red  . Codeine Other (See Comments)    hallucinations  . Demerol Other (See Comments)    hallucinations  . Morphine And Related Other (See Comments)    hallucinations  . Other Other (See Comments)    Invan 7- pt became red all over  . Sulfate Other (See Comments)    unknown   Allergies as of 09/22/2017      Reactions   Ertapenem Hives   Caused whole body to turn red Other reaction(s): Redness Caused whole body to turn red   Codeine Other (See Comments)   hallucinations   Demerol Other (See Comments)   hallucinations   Morphine And Related Other (See Comments)   hallucinations   Other Other (See Comments)   Invan 7- pt became red all over   Sulfate Other (See Comments)   unknown      Medication  List    STOP taking these medications   benzonatate 100 MG capsule Commonly known as:  TESSALON     TAKE these medications   albuterol (2.5 MG/3ML) 0.083% nebulizer solution Commonly known as:  PROVENTIL Take 3 mLs (2.5 mg total) by nebulization every 6 (six) hours as needed. Use 1 ampule in nebulizer 4 times a day or every 4 hours. What changed:    how much to take  how to take this  when to take this  reasons to take this   aspirin 81 MG tablet Take 81 mg by mouth daily. Buys OTC   budesonide 0.5 MG/2ML nebulizer solution Commonly known as:  PULMICORT Take 2 mLs (0.5 mg total) by nebulization 2 (two) times daily. Dx-J45.909, J44.0, J20.9   carbidopa-levodopa 25-100 MG tablet Commonly known as:  SINEMET IR Take 1 tablet by mouth 3 (three) times daily.   Fluticasone-Salmeterol 250-50 MCG/DOSE Aepb Commonly known as:  ADVAIR DISKUS Inhale 1 puff into the lungs 2 (two) times daily.   furosemide 40 MG tablet Commonly known as:  LASIX Take 1 tablet daily for fluid retention or an=kle swelling   levofloxacin 500 MG tablet Commonly known as:  LEVAQUIN Take 1 tablet (500 mg total) by mouth daily for 5 days. Start taking on:  09/23/2017   olmesartan 20 MG tablet Commonly known as:  BENICAR Take 1 tablet daily for BP (replaces Losartan)   omeprazole 40 MG capsule Commonly known as:  PRILOSEC Take 1 capsule daily for Acid Reflux   pravastatin 40 MG tablet Commonly known as:  PRAVACHOL Take 40 mg by mouth daily.   predniSONE 20 MG tablet Commonly known as:  DELTASONE Take 3 PO QAM x3days, 2 PO QAM x3days, 1 PO QAM x3days, then resume regular 10 mg QD Start taking on:  09/23/2017   PROCTOZONE-HC 2.5 % rectal cream Generic drug:  hydrocortisone Apply 1 application topically 2 (two) times daily.   sodium chloride HYPERTONIC 3 % nebulizer solution Take 1 vial by nebulization BID   Vitamin D 2000 units Caps Take 1 capsule by mouth daily.        Procedures/Studies: Dg Chest Port 1 View  Result Date: 09/20/2017 CLINICAL DATA:  Shortness of breath and wheezing. Personal history of pneumothorax. EXAM: PORTABLE CHEST 1 VIEW COMPARISON:  One-view chest x-ray 08/05/2017. FINDINGS: Heart  size is normal. Aortic atherosclerosis is present. Mild pulmonary vascular congestion is present without edema. Mild bibasilar airspace disease is slightly worse on the left. There is no pneumothorax. A small left pleural effusion is not excluded. IMPRESSION: 1. Asymmetric mild left basilar airspace disease and possible effusion. Question pneumonia. 2. No pneumothorax. 3. Mild pulmonary vascular congestion. 4.  Aortic Atherosclerosis (ICD10-I70.0). Electronically Signed   By: San Morelle M.D.   On: 09/20/2017 07:45   Dg Swallowing Func-speech Pathology  Result Date: 09/21/2017 Objective Swallowing Evaluation: Type of Study: MBS-Modified Barium Swallow Study  Patient Details Name: SHEILYN BOEHLKE MRN: [id number] Date of Birth: 10-Apr-1936 Today's Date: 09/21/2017 Time: SLP Start Time (ACUTE ONLY): 1620 -SLP Stop Time (ACUTE ONLY): 1720 SLP Time Calculation (min) (ACUTE ONLY): 60 min Past Medical History: Past Medical History: Diagnosis Date . Balance disorder 2008.    Falls a lot.  She can be standing and then leans too far over to one side  . Bulging disc  . Chronic bronchitis ([place])   due to congestion at times, on prednisone and advair . COPD (chronic obstructive pulmonary disease) ([place])  . Depression   many years ago . GERD (gastroesophageal reflux disease)  . Hearing loss   mild . Hyperlipidemia  . Hypertension  . Iatrogenic adrenal insufficiency ([place])  . Incontinence   not indicated at this visit. Marland Kitchen LVH (left ventricular hypertrophy) due to hypertensive disease   a. 02/2017: echo showing an EF of 65-70% with moderate LVH, hyperdynamic LV with subvalvular gradient of 70 mmHg, SAM, and mild MR . Neuropathy   legs stay numb  . Osteoarthritis   hands,  .  Osteoporosis  . Shortness of breath dyspnea  . Tubulovillous adenoma of rectum  . Wears dentures  . Wears glasses  Past Surgical History: Past Surgical History: Procedure Laterality Date . ABDOMINAL HYSTERECTOMY   . APPENDECTOMY   . CHONDROPLASTY  09/19/2014  Procedure: CHONDROPLASTY;  Surgeon: Alta Corning, MD;  Location: [facility];  Service: Orthopedics;; . DILATION AND CURETTAGE OF UTERUS   . EXTERNAL FIXATION LEG  10/25/2012  Procedure: EXTERNAL FIXATION LEG;  Surgeon: Rozanna Box, MD;  Location: [facility];  Service: Orthopedics;  Laterality: Right; . EYE SURGERY    bilateral cataract surgery and lens implant . FINGER SURGERY    fusions and debridements for OA . INCONTINENCE SURGERY    multiple procedures, not cured . KNEE ARTHROSCOPY WITH LATERAL MENISECTOMY Right 09/19/2014  Procedure: KNEE ARTHROSCOPY WITH LATERAL MENISECTOMY;  Surgeon: Alta Corning, MD;  Location: [facility];  Service: Orthopedics;  Laterality: Right; . KNEE ARTHROSCOPY WITH MEDIAL MENISECTOMY Right 09/19/2014  Procedure: RIGHT KNEE ARTHROSCOPY WITH MEDIAL AND LATERAL MENISECTOMIES. CHONDROPLASTY OF PATELLA-FEMORAL JOINT;  Surgeon: Alta Corning, MD;  Location: [facility];  Service: Orthopedics;  Laterality: Right; . RECTAL BIOPSY  09/21/2011  Procedure: BIOPSY RECTAL;  Surgeon: Merrie Roof, MD;  Location: [facility];  Service: General;  Laterality: N/A;  3-4 cm . RECTAL SURGERY    by dr. Marlou Starks, removal of polyp . TONSILLECTOMY   HPI: Breindy Meadow Hyleris a 81 y.o.femalewith medical history significant of oxygen and steroid-dependent COPD, chronic respiratory failure, presents to the hospital complains of shortness of breath. Patient complains with 2-week history of progressive shortness of breath. She has had productive cough, no fever. She has had increasing wheezing. She has been using her nebulizer without significant benefit. She has not had any chest pain, vomiting, diarrhea. She  has not  had any sick contacts. She denies any myalgias or sinus congestion. Chest x-ray shows: Asymmetric mild left basilar airspace disease and possible effusion. Question pneumonia. SLP consulted due to coughing during meals this admission. Chart review reveals that Pt had MBSS 11/2016 which revealed oral deficits. Pt is followed by Dr. Posey Pronto (neurology) for suspected Parkinson's plus syndrome, probableprogressive supranuclear palsy. Symptoms manifesting with dysarthria, dysphasia, and exam showing asymmetrical left-sided tremor, bradykinesia, and gait unsteadiness. Pt inconsistently takes her Sinemet per family report.  Subjective: "This tastes bad." Assessment / Plan / Recommendation CHL IP CLINICAL IMPRESSIONS 09/21/2017 Clinical Impression Pt presents with mod/severe oral delays characterized by adequate mastication of solids, lingual pumping, decreased posterior propulsion of bolus with holding of all textures and consistencies for up to 2 minutes before Pt able to propel over base of tongue. Pharyngeal phase is essentially WNL. Pt with flash penetration during the swallow with thin and nectar-thick liquids which was immediately expelled. Pt also presented with cough after many of her swallows once triggered, however suspect this is due to Pt attempting to coordinate respiration with swallow (Pt holding breath frequently when attempting to initiate swallow, then swallows, gasps and coughs). No aspiration observed over the course of the study. Swallow deficits could be consistent with PD/PSP with difficulty initiating swallow. SLP spoke with Pt and family at length regarding swallowing evaluation and recommendations. Pt seen in room with dinner meal and she had consumed nearly 50% of her meal prior to my arrival. Pt has lost a significant amount of weight in the past few months per family. Recommend continuing to offer regular textures and all liquids (using straw currently seems more efficient, try providing  distractions from meal like watching TV, continue aspiration and reflux precautions). Pt reports inconsistent consumption of taking her medications. SLP encouraged Pt/family for Pt to take Sinemet as prescribed by Dr. Posey Pronto as this may help her swallowing (oral initiation). SLP will follow x1 during acute stay. Above to RN.  SLP Visit Diagnosis Dysphagia, oropharyngeal phase (R13.12) Attention and concentration deficit following -- Frontal lobe and executive function deficit following -- Impact on safety and function Mild aspiration risk   CHL IP TREATMENT RECOMMENDATION 09/21/2017 Treatment Recommendations Therapy as outlined in treatment plan below   Prognosis 09/21/2017 Prognosis for Safe Diet Advancement Good Barriers to Reach Goals -- Barriers/Prognosis Comment -- CHL IP DIET RECOMMENDATION 09/21/2017 SLP Diet Recommendations Regular solids;Thin liquid Liquid Administration via Cup;Straw Medication Administration Whole meds with puree Compensations -- Postural Changes Remain semi-upright after after feeds/meals (Comment);Seated upright at 90 degrees   CHL IP OTHER RECOMMENDATIONS 09/21/2017 Recommended Consults -- Oral Care Recommendations Oral care BID Other Recommendations Clarify dietary restrictions   CHL IP FOLLOW UP RECOMMENDATIONS 09/21/2017 Follow up Recommendations None   CHL IP FREQUENCY AND DURATION 09/21/2017 Speech Therapy Frequency (ACUTE ONLY) min 2x/week Treatment Duration 1 week      CHL IP ORAL PHASE 09/21/2017 Oral Phase Impaired Oral - Pudding Teaspoon -- Oral - Pudding Cup -- Oral - Honey Teaspoon -- Oral - Honey Cup -- Oral - Nectar Teaspoon -- Oral - Nectar Cup Lingual pumping;Reduced posterior propulsion;Holding of bolus;Delayed oral transit Oral - Nectar Straw -- Oral - Thin Teaspoon -- Oral - Thin Cup Lingual pumping;Reduced posterior propulsion;Holding of bolus;Delayed oral transit Oral - Thin Straw Lingual pumping;Reduced posterior propulsion;Holding of bolus;Delayed oral transit Oral -  Puree Lingual pumping;Reduced posterior propulsion;Holding of bolus;Delayed oral transit Oral - Mech Soft -- Oral - Regular Lingual pumping;Reduced posterior propulsion;Holding of  bolus;Delayed oral transit Oral - Multi-Consistency -- Oral - Pill Reduced posterior propulsion;Holding of bolus Oral Phase - Comment mod/severe oral delays with reduced anterior porterior transit in absence of gross weakness  CHL IP PHARYNGEAL PHASE 09/21/2017 Pharyngeal Phase Impaired Pharyngeal- Pudding Teaspoon -- Pharyngeal -- Pharyngeal- Pudding Cup -- Pharyngeal -- Pharyngeal- Honey Teaspoon -- Pharyngeal -- Pharyngeal- Honey Cup -- Pharyngeal -- Pharyngeal- Nectar Teaspoon -- Pharyngeal -- Pharyngeal- Nectar Cup Penetration/Aspiration during swallow Pharyngeal Material enters airway, remains ABOVE vocal cords then ejected out Pharyngeal- Nectar Straw -- Pharyngeal -- Pharyngeal- Thin Teaspoon -- Pharyngeal -- Pharyngeal- Thin Cup Penetration/Aspiration during swallow Pharyngeal Material enters airway, remains ABOVE vocal cords then ejected out Pharyngeal- Thin Straw Penetration/Aspiration during swallow Pharyngeal Material enters airway, remains ABOVE vocal cords then ejected out Pharyngeal- Puree WFL Pharyngeal -- Pharyngeal- Mechanical Soft -- Pharyngeal -- Pharyngeal- Regular WFL Pharyngeal -- Pharyngeal- Multi-consistency -- Pharyngeal -- Pharyngeal- Pill -- Pharyngeal -- Pharyngeal Comment --  CHL IP CERVICAL ESOPHAGEAL PHASE 09/21/2017 Cervical Esophageal Phase WFL Pudding Teaspoon -- Pudding Cup -- Honey Teaspoon -- Honey Cup -- Nectar Teaspoon -- Nectar Cup -- Nectar Straw -- Thin Teaspoon -- Thin Cup -- Thin Straw -- Puree -- Mechanical Soft -- Regular -- Multi-consistency -- Pill -- Cervical Esophageal Comment -- Thank you, Genene Churn, [place] PORTER,DABNEY 09/21/2017, 7:01 PM                 Subjective: Pt says she feels a lot better and asking to discharge home today.   Discharge Exam: Vitals:    09/22/17 0650 09/22/17 0734  BP: 138/65   Pulse: 97   Resp: 20   Temp: 98.2 F (36.8 C)   SpO2: 91% 94%   Vitals:   09/21/17 2026 09/21/17 2200 09/22/17 0650 09/22/17 0734  BP:  (!) 123/48 138/65   Pulse:  89 97   Resp:  20 20   Temp:  98.7 F (37.1 C) 98.2 F (36.8 C)   TempSrc:  Oral Oral   SpO2: 93% 93% 91% 94%  Weight:      Height:        General exam: Appears calm and comfortable  Respiratory system: Clear to auscultation.  Improving respiratory effort.  Cardiovascular system: S1 & S2 heard, RRR. No JVD, murmurs, rubs, gallops or clicks. No pedal edema. Gastrointestinal system: Abdomen is nondistended, soft and nontender. No organomegaly or masses felt. Normal bowel sounds heard. Central nervous system: Alert and oriented. No focal neurological deficits. Extremities: Symmetric 5 x 5 power. Skin: No rashes, lesions or ulcers Psychiatry: Judgement and insight appear normal. Mood & affect appropriate.     The results of significant diagnostics from this hospitalization (including imaging, microbiology, ancillary and laboratory) are listed below for reference.     Microbiology: Recent Results (from the past 240 hour(s))  Culture, blood (routine x 2)     Status: None (Preliminary result)   Collection Time: 09/20/17  7:28 AM  Result Value Ref Range Status   Specimen Description BLOOD LEFT ARM  Final   Special Requests   Final    BOTTLES DRAWN AEROBIC AND ANAEROBIC Blood Culture adequate volume   Culture NO GROWTH 2 DAYS  Final   Report Status PENDING  Incomplete  Culture, blood (routine x 2)     Status: None (Preliminary result)   Collection Time: 09/20/17  7:45 AM  Result Value Ref Range Status   Specimen Description BLOOD LEFT HAND  Final   Special Requests   Final  BOTTLES DRAWN AEROBIC ONLY Blood Culture adequate volume   Culture NO GROWTH 2 DAYS  Final   Report Status PENDING  Incomplete  MRSA PCR Screening     Status: Abnormal   Collection Time: 09/20/17  12:02 PM  Result Value Ref Range Status   MRSA by PCR POSITIVE (A) NEGATIVE Final    Comment: HEARN,J AT 1910 ON 12.3.2018 BY ISLEY,B        The GeneXpert MRSA Assay (FDA approved for NASAL specimens only), is one component of a comprehensive MRSA colonization surveillance program. It is not intended to diagnose MRSA infection nor to guide or monitor treatment for MRSA infections.      Labs: BNP (last 3 results) Recent Labs    03/09/17 0834 08/05/17 0912 09/20/17 0727  BNP 109.0* 153.0* 884.1*   Basic Metabolic Panel: Recent Labs  Lab 09/20/17 0727 09/20/17 0755 09/21/17 0306  NA 141  --  137  K 2.9*  --  3.9  CL 101  --  106  CO2 31  --  25  GLUCOSE 102*  --  169*  BUN 22*  --  25*  CREATININE 1.04*  --  0.92  CALCIUM 9.3  --  8.8*  MG  --  2.2  --    Liver Function Tests: No results for input(s): AST, ALT, ALKPHOS, BILITOT, PROT, ALBUMIN in the last 168 hours. No results for input(s): LIPASE, AMYLASE in the last 168 hours. No results for input(s): AMMONIA in the last 168 hours. CBC: Recent Labs  Lab 09/20/17 0727 09/21/17 0306  WBC 9.4 6.9  NEUTROABS 5.4  --   HGB 15.7* 13.9  HCT 50.2* 44.6  MCV 92.6 91.6  PLT 225 222   Cardiac Enzymes: Recent Labs  Lab 09/20/17 0727  TROPONINI <0.03   BNP: Invalid input(s): POCBNP CBG: No results for input(s): GLUCAP in the last 168 hours. D-Dimer No results for input(s): DDIMER in the last 72 hours. Hgb A1c No results for input(s): HGBA1C in the last 72 hours. Lipid Profile No results for input(s): CHOL, HDL, LDLCALC, TRIG, CHOLHDL, LDLDIRECT in the last 72 hours. Thyroid function studies No results for input(s): TSH, T4TOTAL, T3FREE, THYROIDAB in the last 72 hours.  Invalid input(s): FREET3 Anemia work up No results for input(s): VITAMINB12, FOLATE, FERRITIN, TIBC, IRON, RETICCTPCT in the last 72 hours. Urinalysis    Component Value Date/Time   COLORURINE YELLOW 02/17/2017 1229   APPEARANCEUR  CLEAR 02/17/2017 1229   LABSPEC 1.015 02/17/2017 1229   PHURINE 6.5 02/17/2017 1229   GLUCOSEU NEGATIVE 02/17/2017 1229   HGBUR TRACE (A) 02/17/2017 1229   BILIRUBINUR NEGATIVE 02/17/2017 1229   KETONESUR NEGATIVE 02/17/2017 1229   PROTEINUR NEGATIVE 02/17/2017 1229   UROBILINOGEN 0.2 04/16/2015 2310   NITRITE NEGATIVE 02/17/2017 1229   LEUKOCYTESUR NEGATIVE 02/17/2017 1229   Sepsis Labs Invalid input(s): PROCALCITONIN,  WBC,  LACTICIDVEN Microbiology Recent Results (from the past 240 hour(s))  Culture, blood (routine x 2)     Status: None (Preliminary result)   Collection Time: 09/20/17  7:28 AM  Result Value Ref Range Status   Specimen Description BLOOD LEFT ARM  Final   Special Requests   Final    BOTTLES DRAWN AEROBIC AND ANAEROBIC Blood Culture adequate volume   Culture NO GROWTH 2 DAYS  Final   Report Status PENDING  Incomplete  Culture, blood (routine x 2)     Status: None (Preliminary result)   Collection Time: 09/20/17  7:45 AM  Result Value Ref  Range Status   Specimen Description BLOOD LEFT HAND  Final   Special Requests   Final    BOTTLES DRAWN AEROBIC ONLY Blood Culture adequate volume   Culture NO GROWTH 2 DAYS  Final   Report Status PENDING  Incomplete  MRSA PCR Screening     Status: Abnormal   Collection Time: 09/20/17 12:02 PM  Result Value Ref Range Status   MRSA by PCR POSITIVE (A) NEGATIVE Final    Comment: HEARN,J AT 1910 ON 12.3.2018 BY ISLEY,B        The GeneXpert MRSA Assay (FDA approved for NASAL specimens only), is one component of a comprehensive MRSA colonization surveillance program. It is not intended to diagnose MRSA infection nor to guide or monitor treatment for MRSA infections.     Time coordinating discharge: 35 minutes  SIGNED:  Irwin Brakeman, MD  [facility] 09/22/2017, 8:41 AM Pager [phone]  If 7PM-7AM, please contact night-coverage [email or web address] Password [email or web address]

## 2017-09-22 NOTE — Care Management Note (Signed)
Case Management Note  Patient Details  Name: MILITZA DEVERY MRN: [id number] Date of Birth: Dec 09, 1935  Subjective/Objective:         Admitted with COPD and pneumonia. Pt is from home, lives with her husband. She is ind with ADL's. She has home O2 and neb machine pta. She has PCP, transportation and insurance with drug coverage. Pt reports compliance with medications. She has had 2 admissions in the past 6 months. Previous admission related to chronic resp failure but no infection was present at that time. Pt is on [facility] registry but is not active. Pt communicates no needs or concerns about DC plan.            Action/Plan: Discharging home with self care today. CM has made referral for [facility] Transition calls at DC.   Expected Discharge Date:  09/22/17               Expected Discharge Plan:  Home/Self Care  In-House Referral:  NA  Discharge planning Services  NA  Post Acute Care Choice:  NA Choice offered to:  NA  Status of Service:  Completed, signed off   Sherald Barge, RN 09/22/2017, 10:42 AM

## 2017-09-22 NOTE — Discharge Instructions (Signed)
Please take prednisone taper as ordered, then resume your regular prednisone dose after that of 10 mg daily.    Follow with Primary MD  [name], MD  and other consultant's as instructed your Hospitalist MD  Please get a complete blood count and chemistry panel checked by your Primary MD at your next visit, and again as instructed by your Primary MD.  Get Medicines reviewed and adjusted: Please take all your medications with you for your next visit with your Primary MD  Laboratory/radiological data: Please request your Primary MD to go over all hospital tests and procedure/radiological results at the follow up, please ask your Primary MD to get all Hospital records sent to his/her office.  In some cases, they will be blood work, cultures and biopsy results pending at the time of your discharge. Please request that your primary care M.D. follows up on these results.  Also Note the following: If you experience worsening of your admission symptoms, develop shortness of breath, life threatening emergency, suicidal or homicidal thoughts you must seek medical attention immediately by calling 911 or calling your MD immediately  if symptoms less severe.  You must read complete instructions/literature along with all the possible adverse reactions/side effects for all the Medicines you take and that have been prescribed to you. Take any new Medicines after you have completely understood and accpet all the possible adverse reactions/side effects.   Do not drive when taking Pain medications or sleeping medications (Benzodaizepines)  Do not take more than prescribed Pain, Sleep and Anxiety Medications. It is not advisable to combine anxiety,sleep and pain medications without talking with your primary care practitioner  Special Instructions: If you have smoked or chewed Tobacco  in the last 2 yrs please stop smoking, stop any regular Alcohol  and or any Recreational drug use.  Wear Seat belts  while driving.  Please note: You were cared for by a hospitalist during your hospital stay. Once you are discharged, your primary care physician will handle any further medical issues. Please note that NO REFILLS for any discharge medications will be authorized once you are discharged, as it is imperative that you return to your primary care physician (or establish a relationship with a primary care physician if you do not have one) for your post hospital discharge needs so that they can reassess your need for medications and monitor your lab values.

## 2017-09-22 NOTE — Care Management Important Message (Signed)
Important Message  Patient Details  Name: RAYLEEN WYRICK MRN: [id number] Date of Birth: 12/27/1935   Medicare Important Message Given:  Yes    Sherald Barge, RN 09/22/2017, 9:24 AM

## 2017-09-25 LAB — CULTURE, BLOOD (ROUTINE X 2)
CULTURE: NO GROWTH
Culture: NO GROWTH
Special Requests: ADEQUATE
Special Requests: ADEQUATE

## 2017-09-28 DIAGNOSIS — R1310 Dysphagia, unspecified: Secondary | ICD-10-CM | POA: Insufficient documentation

## 2017-09-28 NOTE — Progress Notes (Signed)
Hospital follow up  Assessment and Plan: Hospital visit follow up for:  Acute on chronic respiratory failure and CAP. Hospital discharge meds were reviewed, and reconciled with the patient.   Diagnoses and all orders for this visit:  Acute on chronic respiratory failure with hypoxia ([place])       Stable in office; medications reviewed, compliance discussed at length       Blood cultures reviewed per discharge instructions; negative       Questionable benefit of advair due to inability to coordinate/weak respirations - discussed alternative nebulized LABA - Samples of perforomist provided - appropriate use discussed, patient's husband will call if tolerates well and will D/C advair       Will follow up closely in 4 weeks or sooner as needed - discussed patient should call the office should she experience any signs of infection or dyspnea not improved by rescue medications        Go to the ER if any chest pain, shortness of breath, nausea, dizziness, severe HA, changes vision/speech  Community acquired pneumonia, unspecified laterality Possible component of aspiration related to dysphagia - discussed not eating meals while distracted, following diet as recommended by speech pathology, eating slowly - continue follow up with neurology as scheduled, continue sinemet -     CBC with Differential/Platelet -     BASIC METABOLIC PANEL WITH GFR -     DG Chest 2 View; Future -in 4-6 weeks   Hypokalemia Resolved at discharge- will recheck per discharge recommendations -     BASIC METABOLIC PANEL WITH GFR  Murmur     03/2017 ECHO reviewed - mild mitral regurge noted may need to consider repeat echo if murmur does not improve - very mild edema noted on exam today - consider cardiology referral to Dr. Cleora Fleet if continues to struggle with dyspnea- patient may benefit from increased diuretic. Poor candidate for self titration - poor medical compliance.   Over 40 minutes of exam, counseling, chart review,  and complex, high/moderate level critical decision making was performed this visit.   Future Appointments  Date Time Provider [place]  10/04/2017 11:00 AM Narda Amber K, DO [facility] None  03/10/2018 10:00 AM [name], MD [facility] None    HPI 81 y.o.female presents for follow up for transition from recent hospitalization. Admit date to the hospital was 09/20/17, patient was discharged from the hospital on 09/22/17 and our clinical staff contacted the office the day after discharge to set up a follow up appointment. The discharge summary, medications, and diagnostic test results were reviewed before meeting with the patient. The patient was admitted for: acute exacerbation of COPD with hypoxia, CAP. She has a history of O2 dependent restrictive airway disease and reactive airway disease for which poor compliance to recommended treatment has been suspected; she is also diagnosed with parkinson plus syndrome with suspected supranuclear palsy and oropharyngeal dysphagia. She has been treated for respiratory diseases with daily prednisone in addition to 2L continuous O2 with BID advair and nebulized budesonide and PRN nebulized albuterol; prior to her admission, she had missed an office visit, but had contacted the office with concerns of productive cough and was Rx'd ceftin 250 mg. She presented a week later to the ER with notable hypoxia (70% on 2L) and was admitted for IV steroids and IV levofloxacin for suspected CAP.  Blood cultures were obtained during ER course and were reviewed today and confirmed negative.   Patient presents today with husband- doing very well -  O2 sats 99% on RA today in office - has completed levofloxacin without issue; medications reviewed- patient appears to be taking appropriately without reported problems. Discussed concerns over repeated hospital admissions for COPD exacerbation. Discussed patient may not be able to inhale appropriately for full benefit of  advair - discussed alternative perforomist -nebulized medication may be more beneficial.   Images while in the hospital: Dg Chest Port 1 View  Result Date: 09/20/2017 CLINICAL DATA:  Shortness of breath and wheezing. Personal history of pneumothorax. EXAM: PORTABLE CHEST 1 VIEW COMPARISON:  One-view chest x-ray 08/05/2017. FINDINGS: Heart size is normal. Aortic atherosclerosis is present. Mild pulmonary vascular congestion is present without edema. Mild bibasilar airspace disease is slightly worse on the left. There is no pneumothorax. A small left pleural effusion is not excluded. IMPRESSION: 1. Asymmetric mild left basilar airspace disease and possible effusion. Question pneumonia. 2. No pneumothorax. 3. Mild pulmonary vascular congestion. 4.  Aortic Atherosclerosis (ICD10-I70.0). Electronically Signed   By: San Morelle M.D.   On: 09/20/2017 07:45   Dg Swallowing Func-speech Pathology  Result Date: 09/21/2017 Objective Swallowing Evaluation: Type of Study: MBS-Modified Barium Swallow Study   CLINICAL IMPRESSIONS 09/21/2017 Clinical Impression Pt presents with mod/severe oral delays characterized by adequate mastication of solids, lingual pumping, decreased posterior propulsion of bolus with holding of all textures and consistencies for up to 2 minutes before Pt able to propel over base of tongue. Pharyngeal phase is essentially WNL. Pt with flash penetration during the swallow with thin and nectar-thick liquids which was immediately expelled. Pt also presented with cough after many of her swallows once triggered, however suspect this is due to Pt attempting to coordinate respiration with swallow (Pt holding breath frequently when attempting to initiate swallow, then swallows, gasps and coughs). No aspiration observed over the course of the study. Swallow deficits could be consistent with PD/PSP with difficulty initiating swallow. SLP spoke with Pt and family at length regarding swallowing  evaluation and recommendations. Pt seen in room with dinner meal and she had consumed nearly 50% of her meal prior to my arrival. Pt has lost a significant amount of weight in the past few months per family. Recommend continuing to offer regular textures and all liquids (using straw currently seems more efficient, try providing distractions from meal like watching TV, continue aspiration and reflux precautions). Pt reports inconsistent consumption of taking her medications. SLP encouraged Pt/family for Pt to take Sinemet as prescribed by Dr. Posey Pronto as this may help her swallowing (oral initiation).   Home health is not involved.   Past Medical History:  Diagnosis Date  . Balance disorder 2008.     Falls a lot.  She can be standing and then leans too far over to one side   . Bulging disc   . Chronic bronchitis ([place])    due to congestion at times, on prednisone and advair  . COPD (chronic obstructive pulmonary disease) ([place])   . Depression    many years ago  . GERD (gastroesophageal reflux disease)   . Hearing loss    mild  . Hyperlipidemia   . Hypertension   . Iatrogenic adrenal insufficiency ([place])   . Incontinence    not indicated at this visit.  Marland Kitchen LVH (left ventricular hypertrophy) due to hypertensive disease    a. 02/2017: echo showing an EF of 65-70% with moderate LVH, hyperdynamic LV with subvalvular gradient of 70 mmHg, SAM, and mild MR  . Neuropathy    legs stay numb   .  Osteoarthritis    hands,   . Osteoporosis   . Shortness of breath dyspnea   . Tubulovillous adenoma of rectum   . Wears dentures   . Wears glasses      Allergies  Allergen Reactions  . Ertapenem Hives    Caused whole body to turn red Other reaction(s): Redness Caused whole body to turn red  . Codeine Other (See Comments)    hallucinations  . Demerol Other (See Comments)    hallucinations  . Morphine And Related Other (See Comments)    hallucinations  . Other Other (See Comments)    Invan 7- pt  became red all over  . Sulfate Other (See Comments)    unknown      Current Outpatient Medications on File Prior to Visit  Medication Sig Dispense Refill  . albuterol (PROVENTIL) (2.5 MG/3ML) 0.083% nebulizer solution Take 3 mLs (2.5 mg total) by nebulization every 6 (six) hours as needed. Use 1 ampule in nebulizer 4 times a day or every 4 hours.    Marland Kitchen aspirin 81 MG tablet Take 81 mg by mouth daily. Buys OTC    . budesonide (PULMICORT) 0.5 MG/2ML nebulizer solution Take 2 mLs (0.5 mg total) by nebulization 2 (two) times daily. Dx-J45.909, J44.0, J20.9 180 mL 1  . carbidopa-levodopa (SINEMET IR) 25-100 MG tablet Take 1 tablet by mouth 3 (three) times daily. 270 tablet 1  . Cholecalciferol (VITAMIN D) 2000 units CAPS Take 1 capsule by mouth daily.    . Fluticasone-Salmeterol (ADVAIR DISKUS) 250-50 MCG/DOSE AEPB Inhale 1 puff into the lungs 2 (two) times daily. 180 each 3  . furosemide (LASIX) 40 MG tablet Take 1 tablet daily for fluid retention or an=kle swelling 90 tablet 1  . olmesartan (BENICAR) 20 MG tablet Take 1 tablet daily for BP (replaces Losartan) 90 tablet 1  . omeprazole (PRILOSEC) 40 MG capsule Take 1 capsule daily for Acid Reflux 90 capsule 1  . pravastatin (PRAVACHOL) 40 MG tablet Take 40 mg by mouth daily.     . predniSONE (DELTASONE) 20 MG tablet Take 3 PO QAM x3days, 2 PO QAM x3days, 1 PO QAM x3days, then resume regular 10 mg QD 18 tablet 0  . PROCTOZONE-HC 2.5 % rectal cream Apply 1 application topically 2 (two) times daily.    . sodium chloride HYPERTONIC 3 % nebulizer solution Take 1 vial by nebulization BID 240 mL 11   No current facility-administered medications on file prior to visit.     ROS: all negative except above.   Physical Exam: Filed Weights   09/29/17 0900  Weight: 132 lb 9.6 oz (60.1 kg)   BP (!) 118/58   Pulse (!) 59   Temp (!) 97.5 F (36.4 C)   Ht 5\' 3"  (1.6 m)   Wt 132 lb 9.6 oz (60.1 kg)   SpO2 99%   BMI 23.49 kg/m  General Appearance:  Well nourished, in no apparent distress. Eyes: PERRLA, EOMs, conjunctiva no swelling or erythema Sinuses: No Frontal/maxillary tenderness ENT/Mouth: Ext aud canals clear, TMs without erythema, bulging. No erythema, swelling, or exudate on post pharynx.  Tonsils not swollen or erythematous. Hearing normal.  Neck: Supple, thyroid normal.  Respiratory: Respiratory effort normal, BS equal bilaterally without rales, rhonchi, wheezing or stridor.  Cardio: RRR with blowing holosystolic 4/6 murmur heard over L and R 2nd ICS. Symmetrical 1+ peripheral pulses with 1+ nonpitting edema to bilateral ankles.  Abdomen: Soft, + BS.  Non tender, no guarding, rebound, hernias, masses. Lymphatics:  Non tender without lymphadenopathy.  Musculoskeletal: Slow shuffling gait with walker, slow sitting to standing   Skin: Warm, dry without rashes, lesions, ecchymosis.  Psych: Awake and oriented X 3, normal affect, Insight and Judgment appropriate.     Marie Ribas, NP 9:16 AM [facility]

## 2017-09-29 ENCOUNTER — Ambulatory Visit (INDEPENDENT_AMBULATORY_CARE_PROVIDER_SITE_OTHER): Payer: Medicare Other | Admitting: Adult Health

## 2017-09-29 ENCOUNTER — Encounter: Payer: Self-pay | Admitting: Adult Health

## 2017-09-29 VITALS — BP 118/58 | HR 59 | Temp 97.5°F | Ht 63.0 in | Wt 132.6 lb

## 2017-09-29 DIAGNOSIS — J9621 Acute and chronic respiratory failure with hypoxia: Secondary | ICD-10-CM

## 2017-09-29 DIAGNOSIS — R1312 Dysphagia, oropharyngeal phase: Secondary | ICD-10-CM | POA: Diagnosis not present

## 2017-09-29 DIAGNOSIS — R011 Cardiac murmur, unspecified: Secondary | ICD-10-CM

## 2017-09-29 DIAGNOSIS — E876 Hypokalemia: Secondary | ICD-10-CM

## 2017-09-29 DIAGNOSIS — J189 Pneumonia, unspecified organism: Secondary | ICD-10-CM

## 2017-09-29 LAB — BASIC METABOLIC PANEL WITH GFR
BUN / CREAT RATIO: 22 (calc) (ref 6–22)
BUN: 26 mg/dL — AB (ref 7–25)
CALCIUM: 9 mg/dL (ref 8.6–10.4)
CO2: 34 mmol/L — AB (ref 20–32)
CREATININE: 1.17 mg/dL — AB (ref 0.60–0.93)
Chloride: 100 mmol/L (ref 98–110)
GFR, Est African American: 51 mL/min/{1.73_m2} — ABNORMAL LOW (ref >=60)
GFR, Est Non African American: 44 mL/min/{1.73_m2} — ABNORMAL LOW (ref >=60)
GLUCOSE: 63 mg/dL — AB (ref 65–99)
Potassium: 4 mmol/L (ref 3.5–5.3)
Sodium: 143 mmol/L (ref 135–146)

## 2017-09-29 LAB — CBC WITH DIFFERENTIAL/PLATELET
BASOS PCT: 0.3 %
Basophils Absolute: 22 cells/uL (ref 0–200)
EOS ABS: 295 {cells}/uL (ref 15–500)
Eosinophils Relative: 4.1 %
HCT: 47.3 % — ABNORMAL HIGH (ref 35.0–45.0)
Hemoglobin: 15.8 g/dL — ABNORMAL HIGH (ref 11.7–15.5)
Lymphs Abs: 2002 cells/uL (ref 850–3900)
MCH: 29.3 pg (ref 27.0–33.0)
MCHC: 33.4 g/dL (ref 32.0–36.0)
MCV: 87.8 fL (ref 80.0–100.0)
MONOS PCT: 9.9 %
MPV: 10.8 fL (ref 7.5–12.5)
NEUTROS ABS: 4169 {cells}/uL (ref 1500–7800)
Neutrophils Relative %: 57.9 %
Platelets: 273 10*3/uL (ref 140–400)
RBC: 5.39 10*6/uL — ABNORMAL HIGH (ref 3.80–5.10)
RDW: 13.3 % (ref 11.0–15.0)
Total Lymphocyte: 27.8 %
WBC: 7.2 10*3/uL (ref 3.8–10.8)
WBCMIX: 713 {cells}/uL (ref 200–950)

## 2017-09-29 MED ORDER — FORMOTEROL FUMARATE 20 MCG/2ML IN NEBU: 20.0000 ug | INHALATION_SOLUTION | Freq: Two times a day (BID) | RESPIRATORY_TRACT | 2 refills | Status: DC

## 2017-09-29 NOTE — Patient Instructions (Addendum)
I am giving you samples for performist today - this is a nebulized medication that you take twice a day instead of your advair inhaler. Give [place] a call to let us know how this medication works for you - stop advair while taking this medication.   Chronic Obstructive Pulmonary Disease Exacerbation Chronic obstructive pulmonary disease (COPD) is a common lung problem. In COPD, the flow of air from the lungs is limited. COPD exacerbations are times that breathing gets worse and you need extra treatment. Without treatment they can be life threatening. If they happen often, your lungs can become more damaged. If your COPD gets worse, your doctor may treat you with:  Medicines.  Oxygen.  Different ways to clear your airway, such as using a mask.  Follow these instructions at home:  Do not smoke.  Avoid tobacco smoke and other things that bother your lungs.  If given, take your antibiotic medicine as told. Finish the medicine even if you start to feel better.  Only take medicines as told by your doctor.  Drink enough fluids to keep your pee (urine) clear or pale yellow (unless your doctor has told you not to).  Use a cool mist machine (vaporizer).  If you use oxygen or a machine that turns liquid medicine into a mist (nebulizer), continue to use them as told.  Keep up with shots (vaccinations) as told by your doctor.  Exercise regularly.  Eat healthy foods.  Keep all doctor visits as told. Get help right away if:  You are very short of breath and it gets worse.  You have trouble talking.  You have bad chest pain.  You have blood in your spit (sputum).  You have a fever.  You keep throwing up (vomiting).  You feel weak, or you pass out (faint).  You feel confused.  You keep getting worse. This information is not intended to replace advice given to you by your health care provider. Make sure you discuss any questions you have with your health care provider. Document  Released: 09/24/2011 Document Revised: 03/12/2016 Document Reviewed: 06/09/2013 Elsevier Interactive Patient Education  2017 [employer].

## 2017-10-04 ENCOUNTER — Ambulatory Visit (INDEPENDENT_AMBULATORY_CARE_PROVIDER_SITE_OTHER): Payer: Medicare Other | Admitting: Neurology

## 2017-10-04 ENCOUNTER — Encounter: Payer: Self-pay | Admitting: Neurology

## 2017-10-04 VITALS — BP 120/70 | HR 68 | Ht 63.0 in | Wt 113.0 lb

## 2017-10-04 DIAGNOSIS — I248 Other forms of acute ischemic heart disease: Secondary | ICD-10-CM

## 2017-10-04 DIAGNOSIS — G629 Polyneuropathy, unspecified: Secondary | ICD-10-CM | POA: Diagnosis not present

## 2017-10-04 DIAGNOSIS — G232 Striatonigral degeneration: Secondary | ICD-10-CM | POA: Diagnosis not present

## 2017-10-04 DIAGNOSIS — R270 Ataxia, unspecified: Secondary | ICD-10-CM | POA: Diagnosis not present

## 2017-10-04 DIAGNOSIS — G20C Parkinsonism, unspecified: Secondary | ICD-10-CM

## 2017-10-04 MED ORDER — CARBIDOPA-LEVODOPA 25-100 MG PO TABS: 1.5000 | ORAL_TABLET | Freq: Two times a day (BID) | ORAL | 3 refills | Status: DC

## 2017-10-04 NOTE — Progress Notes (Signed)
Follow-up Visit   Date: 10/04/17    Marie [name] MRN: [id number] DOB: [birth year]   Interim History: Marie [name] is a 81 y.o. 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  The patient was accompanied to the clinic by husband who also provides collateral information.    History of present illness: Starting in around the fall 2017, she began having difficulty swallowing, which is worse with liquids.  She had difficulty initiating swallowing and had MBS which showed markedly delayed oral transit.  Fortunately, she has not had any choking spells.  There has been 30lb unintentional weight loss. She also complains of difficulty with speech production, but husband has not noticed slurred speech.  There are no exacerbating factors.  There is no diurnal variation to there symptoms.  She does not feel that symptoms are progressing, but she is concerned that they are not improving. She denies double vision, droopy eyelids, or weakness of the arms and legs.  She is short of breath, especially when eating. She sleeps on a wedge for the past few years because of acid reflux. She complains of generalized fatigue for the past 2 years.  She has been using a walker during the same time because of imbalance.  She denies numbness/burning/tingling of the legs.  She saw Dr Jannifer Franklin in 2009 at [facility], but does not recall the reason for her visit.  For her wheezing, has been evaluated by Dr. Lake Bells whose PFTs were suggestive of restrictive pattern and therefore referred to Dr. Redmond Baseman, ENT, to evaluate for upper airway obstruction, which was not present on his exam.  She also has echocardiogram which was notable for grade 1 diastolic dysfunction.  She has been treated for COPD and URI with several courses of antibiotics and prednisone.  Most recently she was admitted on 3/12 -  3/14 for acute respiratory failure secondary to pneumonia and COPD exacerbation.  She denies noticing any benefit with respect to her speech or swallow function while being on prednisone.   UPDATE 03/04/2017:  She is here to discuss her results of EMG and labs.  There was no evidence of a neuromuscular junction disorder, myopathy, or disorder of anterior horn cells on EMG. She has no neuropathy and mild bulbar neurogenic changes. Labs including screening for myopathy, vitamin B12 deficiency, and myasthenia gravis also returned normal.  Overall, she feels that her symptoms of difficulty swallowing and talking stabilized. She has difficulty with swallowing her capsulated medications, but otherwise has no problems with liquids or solids.  She was evaluated by pulmonology who is findings indicated restrictive lung disease; she continues to struggle with chronic cough and wheezing. She has noticed intermittent left hand tremor.   UPDATE 06/11/2017:  At her last visit, we discussed the likelihood of parkinson-plus syndrome, such as PSP, and was started on sinemet 25/100 1 tab TID, but only takes it at noon and 11pm.  She denies side effects and feels that it may have helped her left hand tremor. Her swallowing difficulty is stable without any worsening.  She is able to swallow her pills and eats two good meals daily.  She has lost 12lb since May.  She had two ER visits for cough and shortness of breath.  No interval falls.    UPDATE 10/04/2017:  She is here 4 month appointment.  She is doing well on 25/100mg  1 tablet twice daily and feels that her  movements are somewhat better.  She denies any further problems with swallowing, but does complain of poor appetite and continues to loose weight, 15lb since October.  She has had three ER visits for COPD.  No interval falls.   Medications:  Current Outpatient Medications on File Prior to Visit  Medication Sig Dispense Refill  . albuterol (PROVENTIL) (2.5 MG/3ML) 0.083%  nebulizer solution Take 3 mLs (2.5 mg total) by nebulization every 6 (six) hours as needed. Use 1 ampule in nebulizer 4 times a day or every 4 hours.    Marland Kitchen aspirin 81 MG tablet Take 81 mg by mouth daily. Buys OTC    . budesonide (PULMICORT) 0.5 MG/2ML nebulizer solution Take 2 mLs (0.5 mg total) by nebulization 2 (two) times daily. Dx-J45.909, J44.0, J20.9 180 mL 1  . Cholecalciferol (VITAMIN D) 2000 units CAPS Take 1 capsule by mouth daily.    . Fluticasone-Salmeterol (ADVAIR DISKUS) 250-50 MCG/DOSE AEPB Inhale 1 puff into the lungs 2 (two) times daily. 180 each 3  . formoterol (PERFOROMIST) 20 MCG/2ML nebulizer solution Take 2 mLs (20 mcg total) by nebulization 2 (two) times daily. 360 mL 2  . furosemide (LASIX) 40 MG tablet Take 1 tablet daily for fluid retention or an=kle swelling 90 tablet 1  . olmesartan (BENICAR) 20 MG tablet Take 1 tablet daily for BP (replaces Losartan) 90 tablet 1  . omeprazole (PRILOSEC) 40 MG capsule Take 1 capsule daily for Acid Reflux 90 capsule 1  . pravastatin (PRAVACHOL) 40 MG tablet Take 40 mg by mouth daily.     . predniSONE (DELTASONE) 20 MG tablet Take 3 PO QAM x3days, 2 PO QAM x3days, 1 PO QAM x3days, then resume regular 10 mg QD 18 tablet 0  . PROCTOZONE-HC 2.5 % rectal cream Apply 1 application topically 2 (two) times daily.    . sodium chloride HYPERTONIC 3 % nebulizer solution Take 1 vial by nebulization BID 240 mL 11   No current facility-administered medications on file prior to visit.     Allergies:  Allergies  Allergen Reactions  . Ertapenem Hives    Caused whole body to turn red Other reaction(s): Redness Caused whole body to turn red  . Codeine Other (See Comments)    hallucinations  . Demerol Other (See Comments)    hallucinations  . Morphine And Related Other (See Comments)    hallucinations  . Other Other (See Comments)    Invan 7- pt became red all over  . Sulfate Other (See Comments)    unknown    Review of Systems:    CONSTITUTIONAL: No fevers, chills, night sweats, or weight loss.  EYES: No visual changes or eye pain ENT: No hearing changes.  No history of nose bleeds.   RESPIRATORY: +cough, +wheezing and shortness of breath.   CARDIOVASCULAR: Negative for chest pain, and palpitations.   GI: Negative for abdominal discomfort, blood in stools or black stools.  No recent change in bowel habits.   GU:  No history of incontinence.   MUSCLOSKELETAL: +history of joint pain or swelling.  No myalgias.   SKIN: Negative for lesions, rash, and itching.   ENDOCRINE: Negative for cold or heat intolerance, polydipsia or goiter.   PSYCH:  No depression or anxiety symptoms.   NEURO: As Above.   Vital Signs:  BP 120/70   Pulse 68   Ht 5\' 3"  (1.6 m)   Wt 113 lb (51.3 kg)   BMI 20.02 kg/m   General:  Thin-appearing, comfortable. Startled facial  expression.  Neurological Exam: MENTAL STATUS including orientation to time, place, person, recent and remote memory, attention span and concentration, language, and fund of knowledge is normal.  Speech is slow, mild dysarthria (stable).  CRANIAL NERVES:  Pupils equal round and reactive to light.  Restricted upgaze bilaterally, otherwise normal conjugate, extra-ocular eye movements.  No ptosis. Face is symmetric. Facial muscles are 5/5.  MOTOR:  Motor strength is 5/5 in all extremities, except interosseus muscles of the hands which is 4+/5. There is generalized loss of muscle bulk with atrophy of the intrinsic hand muscles. Resting hand tremor on the left is present. Tone is 0+ bilateral upper extremities.    COORDINATION/GAIT:  Normal finger-to- nose-finger.  Severe bradykinesia with reduced amplitude and rate of finger and toe tapping on the left >. right.  Gait is stooped, ataxic, assisted with a walker.   Data: Labs 01/01/2017:  CK 36, AChR antibody negative, aldolase 6.3, vitamin B12 503  NCS/EMG of the right side 03/02/2017: 1. There is evidence of a length  dependent sensorimotor polyneuropathy, axon loss in type, affecting the upper and lower extremities; moderate in degree electrically. 2. Chronic motor axon loss changes seen in the bulbar muscles. In isolation, these findings are of unclear clinical significance. Correlate clinically. 3. There is no evidence of a neuromuscular junction disorder, widespread disorder of anterior horn cells, or cervical/lumbosacral radiculopathy affecting the right side.   IMPRESSION/PLAN: 1.  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  Symptoms manifesting with dysarthria, dysphasia, and exam showing asymmetrical left-sided tremor, bradykinesia, and gait unsteadiness. Clinically, her tone improved with sinemet and she is tolerating this well.  She continues to have marked bradykinesia, worse on the left.   I will further titrate her sinemet to 25/100 1.5 tablet twice daily (10am and 4pm).  She prefers to avoid TID dosing.   2.  Poor appetite with weight loss, recommend that she add 1-2 cans of ensure daily   3.  Possibly due to cervical canal stenosis (generalized hyperreflexia). She denies any arm weakness or paresthesias.  Proceed with imaging only if she has new arm symptoms.  4  Idiopathic peripheral neuropathy, with numbness and gait ataxia.  Continue to use rollator.  Fall precautions discussed.  Return to clinic in 5 months    Thank you for allowing me to participate in patient's care.  If I can answer any additional questions, I would be pleased to do so.    Sincerely,    Galadriel Shroff K. Posey Pronto, DO

## 2017-10-04 NOTE — Patient Instructions (Signed)
1.  Add 1-2 cans of ensure daily 2.  Increase sinemet to 25/100 1.5 tablets at 10am and 4pm. 3.  Return to clinic in 5 months

## 2017-10-19 DIAGNOSIS — I700 Atherosclerosis of aorta: Secondary | ICD-10-CM | POA: Insufficient documentation

## 2017-10-19 NOTE — Progress Notes (Signed)
MEDICARE ANNUAL WELLNESS VISIT AND FOLLOW UP  Assessment:   Diagnoses and all orders for this visit:  Encounter for Medicare annual wellness exam  Community acquired pneumonia, unspecified laterality ?Ongoing after hospitalization; CXR, CBC, BMP - neb in office today for dyspnea/wheezing with improvement. Doxy/prednisone taper prescribed. Keep 1 week follow up OV; call if not improving in 2 days.   Acute on chronic respiratory failure with hypoxia (HCC) Treating possible repeated pneumonia from recent hospitalization; will retry switching from advair to perforomist due to questionable inability to fully inhale medication secondary to weakness/coordination.   Atherosclerosis of aorta Control blood pressure, cholesterol, glucose, increase exercise.   LVH (left ventricular hypertrophy) Last ECHO 03/2017 reviewed; LV EF 65-70%; cardiology ref for questionable new murmur and ongoing dyspnea   Hypertensive heart disease without heart failure Continue close monitoring of BP; continue medications Monitor blood pressure at home; call if consistently over 130/80 Continue DASH diet.   Reminder to go to the ER if any CP, SOB, nausea, dizziness, severe HA, changes vision/speech, left arm numbness and tingling and jaw pain.  Restrictive lung disease Questionable per pulm; possibly weakness related to Parkinson's Continue respiratory medications Continue follow up with pulmonology; plan is for follow up pulmonary function testing 1 year from previous  COPD with chronic bronchitis ([place]) Continue respiratory medications; trialing switch from advair to perforomist;  follow up with pulmonology   Oropharyngeal dysphagia Continue to monitor; possibly contributing to frequent respiratory infections Discussed eating slowly, not eating while distracted Follow up with speech therapy as needed  Tubulovillous adenoma of rectum Questionably overdue for Colonoscopy in 2017; does not want another  colonoscopy but discussed there may be less invasive tests to evaluate rectum/distal colon for follow up. Will refer back to discuss options and for medical recommendation.   Gastroesophageal reflux disease without esophagitis Well managed on current medications Discussed diet, avoiding triggers and other lifestyle changes  Hereditary and idiopathic peripheral neuropathy Continue follow up with neurology  Parkinson's plus syndrome ([place]) Improved with sinemet per neurology; continue follow up.  Osteoarthritis, unspecified osteoarthritis type, unspecified site Stable; managed by OTC medications  Urinary incontinence, unspecified type Fully incontinent; wears briefs, discussed risks/benefits of medications - declines  Mixed hyperlipidemia Currently still managed on pravastatin 40 mg daily with excellent LDL control May consider lower dose statin/stopping secondary to age and severe protein-calorie malnutrition Continue to encourage low cholesterol diet and exercise  Prediabetes Continue to encourage lifestyle for healthy glucose Check BMP/A1C as indicated for monitoring  Vitamin D deficiency Continue supplementation for goal of 70-100 Check vitamin D level as needed  At high risk for falls Secondary to Parkinson's, frailty, cardiovascular status Continue close monitoring; limit high risk medications when possible Denies recent falls  Depression, major, in remission ([place]) Continue medications  Lifestyle discussed: diet/exerise, sleep hygiene, stress management, hydration  Protein-calorie malnutrition, severe Continue to monitor; encourage frequent, high calorie meals Supplement with ensure/protein drinks -discussed small, regular meals, high protein options  Murmur A questionably new murmur was  noted at the last visit; loud 5/6 - over S1 and S2, heard all over chest, blowing, mechanical. ECHOs  11/2016 and 03/2017 reviewed; no significant valvular problems noted at that time;  possibly related to LVH noted. Murmur remains today and is questionably louder today. Will refer to cardiology for reeval as she does not currently see cardiology regularly.    Over 40 minutes of exam, counseling, chart review and critical decision making was performed Future Appointments  Date Time Provider [place]  10/28/2017  2:30 PM [name], MD [facility] None  03/04/2018 11:30 AM Alda Berthold, DO [facility] None  03/10/2018 10:00 AM [name], MD [facility] None     Plan:   During the course of the visit the patient was educated and counseled about appropriate screening and preventive services including:    Pneumococcal vaccine   Prevnar 13  Influenza vaccine  Td vaccine  Screening electrocardiogram  Bone densitometry screening  Colorectal cancer screening  Diabetes screening  Glaucoma screening  Nutrition counseling   Advanced directives: requested   Subjective:  Marie [name] is a 82 y.o. Caucasian female with ?COPD/restrictive lung disease, Parkinson's plus syndrome, oropharyngeal dysphagia who presents for Medicare Annual Wellness Visit and acute visit for concerns of URI - dyspnea/wheezing x5 days, productive cough since yesterday. She is s/p recent hospitalization on 12/3 for questionable pneumonia and acute on chronic respiratory failure with hypoxia (O2 sats 70s); she was doing well at hospital follow up on 12/12 having completed course of levofloxacin and prednisone. She was previously prescribed advair but was given a trial of perforomist for concerns about inspiratory effort/coordionation barriers. She reports she did well with the samples, but prescription never came through and has resumed advair.   A questionably new murmur was also noted at the last visit; loud 5/6 - over S1 and S2, heard all over chest, blowing, mechanical. ECHOs  11/2016 and 03/2017 reviewed; no significant valvular problems noted at that time; possibly related  to LVH noted. Murmur remains today and is questionably louder today.   She has ongoing severe protein calorie malnutrition; drinks ensure most days; reports decreased appetite but not early satiety. Has gained back 11 lb since hospitalization.  BMI is Body mass index is 21.97 kg/m. Wt Readings from Last 3 Encounters:  10/20/17 124 lb (56.2 kg)  10/04/17 113 lb (51.3 kg)  09/29/17 132 lb 9.6 oz (60.1 kg)    Medication Review: Current Outpatient Medications on File Prior to Visit  Medication Sig Dispense Refill  . albuterol (PROVENTIL) (2.5 MG/3ML) 0.083% nebulizer solution Take 3 mLs (2.5 mg total) by nebulization every 6 (six) hours as needed. Use 1 ampule in nebulizer 4 times a day or every 4 hours.    Marland Kitchen aspirin 81 MG tablet Take 81 mg by mouth daily. Buys OTC    . budesonide (PULMICORT) 0.5 MG/2ML nebulizer solution Take 2 mLs (0.5 mg total) by nebulization 2 (two) times daily. Dx-J45.909, J44.0, J20.9 180 mL 1  . carbidopa-levodopa (SINEMET IR) 25-100 MG tablet Take 1.5 tablets by mouth 2 (two) times daily. 270 tablet 3  . Cholecalciferol (VITAMIN D) 2000 units CAPS Take 1 capsule by mouth daily.    . Fluticasone-Salmeterol (ADVAIR DISKUS) 250-50 MCG/DOSE AEPB Inhale 1 puff into the lungs 2 (two) times daily. 180 each 3  . formoterol (PERFOROMIST) 20 MCG/2ML nebulizer solution Take 2 mLs (20 mcg total) by nebulization 2 (two) times daily. 360 mL 2  . furosemide (LASIX) 40 MG tablet Take 1 tablet daily for fluid retention or an=kle swelling 90 tablet 1  . olmesartan (BENICAR) 20 MG tablet Take 1 tablet daily for BP (replaces Losartan) 90 tablet 1  . omeprazole (PRILOSEC) 40 MG capsule Take 1 capsule daily for Acid Reflux 90 capsule 1  . pravastatin (PRAVACHOL) 40 MG tablet Take 40 mg by mouth daily.     . predniSONE (DELTASONE) 20 MG tablet Take 3 PO QAM x3days, 2 PO QAM x3days, 1 PO QAM x3days, then resume regular 10  mg QD 18 tablet 0  . PROCTOZONE-HC 2.5 % rectal cream Apply 1  application topically 2 (two) times daily.    . sodium chloride HYPERTONIC 3 % nebulizer solution Take 1 vial by nebulization BID 240 mL 11   No current facility-administered medications on file prior to visit.     Allergies  Allergen Reactions  . Ertapenem Hives    Caused whole body to turn red Other reaction(s): Redness Caused whole body to turn red  . Codeine Other (See Comments)    hallucinations  . Demerol Other (See Comments)    hallucinations  . Morphine And Related Other (See Comments)    hallucinations  . Other Other (See Comments)    Invan 7- pt became red all over  . Sulfate Other (See Comments)    unknown    Current Problems (verified) Patient Active Problem List   Diagnosis Date Noted  . Aortic atherosclerosis ([place]) 10/19/2017  . Murmur 09/29/2017  . Oropharyngeal dysphagia 09/28/2017  . CAP (community acquired pneumonia) 09/20/2017  . Acute on chronic respiratory failure with hypoxia ([place]) 09/20/2017  . COPD with chronic bronchitis ([place]) 07/20/2017  . Hypertensive heart disease 03/29/2017  . Protein-calorie malnutrition, severe 03/17/2017  . Parkinson's plus syndrome ([place]) 03/04/2017  . Restrictive lung disease 02/24/2017  . LVH (left ventricular hypertrophy) 01/07/2017  . Depression, major, in remission ([place]) 09/22/2016  . At high risk for falls 10/16/2015  . Prediabetes 08/23/2014  . Vitamin D deficiency 08/23/2014  . Medication management 08/23/2014  . Unstable Gait 01/30/2014  . Incontinence   . Osteoarthritis   . Hyperlipidemia   . GERD (gastroesophageal reflux disease)   . Hereditary and idiopathic peripheral neuropathy   . Tubulovillous adenoma of rectum 09/04/2011    Screening Tests Immunization History  Administered Date(s) Administered  . Influenza, High Dose Seasonal PF 08/23/2014, 07/24/2015, 08/13/2016, 07/12/2017  . Pneumococcal Conjugate-13 08/23/2014  . Pneumococcal-Unspecified 07/20/2003, 08/17/2013  . Td 08/17/2013  . Tdap  10/25/2012  . Zoster 03/19/2015   Preventative care: Last colonoscopy: 2012 - was due for follow up in 2017 due to polyp - will refer back to discuss Last mammogram: 04/2017 Last pap smear/pelvic exam: remote   DEXA: 02/07/2015  Prior vaccinations: TD or Tdap: 2014  Influenza: 2018 Pneumococcal: 2014 Prevnar13: 2015 Shingles/Zostavax: 2016  Names of Other Physician/Practitioners you currently use: 1. [facility] here for primary care 2. Eulas Post, eye doctor, last visit 2016 3. Harrington Challenger, dentist, last visit Nov 2017  Patient Care Team: [name], MD as PCP - General (Internal Medicine) Dorna Leitz, MD as Consulting Physician (Orthopedic Surgery) Teena Irani, MD as Consulting Physician (Gastroenterology) Garrel Ridgel, [place] as Consulting Physician (Podiatry) Juanito Doom, MD as Consulting Physician (Pulmonary Disease) Alda Berthold, DO as Consulting Physician (Neurology)  SURGICAL HISTORY She  has a past surgical history that includes Incontinence surgery; Rectal surgery; Appendectomy; Abdominal hysterectomy; Tonsillectomy; Eye surgery; Dilation and curettage of uterus; Rectal biopsy (09/21/2011); Finger surgery; External fixation leg (10/25/2012); Knee arthroscopy with medial menisectomy (Right, 09/19/2014); Knee arthroscopy with lateral menisectomy (Right, 09/19/2014); and Chondroplasty (09/19/2014). FAMILY HISTORY Her family history includes COPD in her mother; Cancer in her sister; Heart attack in her father; High blood pressure in her mother. SOCIAL HISTORY She  reports that  has never smoked. she has never used smokeless tobacco. She reports that she does not drink alcohol or use drugs.   MEDICARE WELLNESS OBJECTIVES: Physical activity: Current Exercise Habits: Home exercise routine, Type  of exercise: walking, Time (Minutes): 10, Frequency (Times/Week): 7, Weekly Exercise (Minutes/Week): 70, Intensity: Mild, Exercise limited by:  respiratory conditions(s);neurologic condition(s) Cardiac risk factors: Cardiac Risk Factors include: advanced age (>19men, >56 women);dyslipidemia;hypertension Depression/mood screen:   Depression screen [facility] 2/9 10/20/2017  Decreased Interest 0  Down, Depressed, Hopeless 0  PHQ - 2 Score 0  Some recent data might be hidden    ADLs:  In your present state of health, do you have any difficulty performing the following activities: 10/20/2017 09/20/2017  Hearing? N Y  Comment Has hearing aids, wears as needed -  Vision? N N  Difficulty concentrating or making decisions? N N  Walking or climbing stairs? Y N  Comment Uses walker, needs assistance with stairs -  Dressing or bathing? N N  Doing errands, shopping? N N  Comment Can drive, but generally driven by husband Facilities manager and eating ? N -  Using the Toilet? N -  In the past six months, have you accidently leaked urine? N -  Do you have problems with loss of bowel control? N -  Managing your Medications? N -  Managing your Finances? N -  Housekeeping or managing your Housekeeping? Y -  Comment Has hired help for house cleaning -  Some recent data might be hidden     Cognitive Testing  Alert? Yes  Normal Appearance?Yes  Oriented to person? Yes  Place? Yes   Time? Yes  Recall of three objects?  Yes  Can perform simple calculations? Yes  Displays appropriate judgment?Yes  Can read the correct time from a watch face?Yes  EOL planning: Does Patient Have a Medical Advance Directive?: No Would patient like information on creating a medical advance directive?: Yes (MAU/Ambulatory/Procedural Areas - Information given)  Review of Systems  Constitutional: Negative for chills, fever, malaise/fatigue and weight loss.  HENT: Negative for congestion, ear discharge, ear pain, hearing loss, sinus pain, sore throat and tinnitus.   Eyes: Negative for blurred vision and double vision.  Respiratory: Positive for cough, sputum production,  shortness of breath and wheezing. Negative for hemoptysis.   Cardiovascular: Negative for chest pain, palpitations, orthopnea, claudication and leg swelling.  Gastrointestinal: Negative for abdominal pain, blood in stool, constipation, diarrhea, heartburn, melena, nausea and vomiting.  Genitourinary: Negative.   Musculoskeletal: Negative for falls, joint pain and myalgias.  Skin: Negative for rash.  Neurological: Negative for dizziness, tingling, sensory change, weakness and headaches.  Endo/Heme/Allergies: Negative for polydipsia.  Psychiatric/Behavioral: Negative.  Negative for depression and memory loss. The patient is not nervous/anxious.   All other systems reviewed and are negative.    Objective:     Today's Vitals   10/20/17 1146  BP: 108/62  Pulse: 94  Temp: (!) 97.5 F (36.4 C)  SpO2: 96%  Weight: 124 lb (56.2 kg)  Height: 5\' 3"  (1.6 m)   Body mass index is 21.97 kg/m.  General appearance: alert, no distress, frail female HEENT: normocephalic, sclerae anicteric, TMs pearly, nares patent, no discharge or erythema, pharynx normal Oral cavity: MMM, no lesions Neck: supple, no lymphadenopathy, no thyromegaly, no masses Heart: RRR, normal S1, S2, no murmurs Lungs: Breath sounds intact throughout; somewhat diminished RLL; wheezing bilateral upper lobes/bronchial areas - no rhonchi, or rales Abdomen: +bs, soft, non tender, non distended, no masses, no hepatomegaly, no splenomegaly Musculoskeletal: nontender, no swelling, no obvious deformity Extremities: no edema, no cyanosis, no clubbing Pulses: 2+ symmetric, upper and lower extremities, normal cap refill Neurological: alert, oriented x 3, CN2-12  intact, strength weak but symmetrical upper extremities and lower extremities, sensation normal throughout, walks very slowly with walker - shuffling gait Psychiatric: normal affect, behavior normal, pleasant   Medicare Attestation I have personally reviewed: The patient's  medical and social history Their use of alcohol, tobacco or illicit drugs Their current medications and supplements The patient's functional ability including ADLs,fall risks, home safety risks, cognitive, and hearing and visual impairment Diet and physical activities Evidence for depression or mood disorders  The patient's weight, height, BMI, and visual acuity have been recorded in the chart.  I have made referrals, counseling, and provided education to the patient based on review of the above and I have provided the patient with a written personalized care plan for preventive services.     Izora Ribas, NP   10/20/2017

## 2017-10-20 ENCOUNTER — Encounter: Payer: Self-pay | Admitting: Adult Health

## 2017-10-20 ENCOUNTER — Ambulatory Visit (INDEPENDENT_AMBULATORY_CARE_PROVIDER_SITE_OTHER): Payer: Medicare Other | Admitting: Adult Health

## 2017-10-20 ENCOUNTER — Ambulatory Visit (HOSPITAL_COMMUNITY)
Admission: RE | Admit: 2017-10-20 | Discharge: 2017-10-20 | Disposition: A | Discharge status: Home / Self Care | Payer: Medicare Other | Source: Ambulatory Visit | Attending: Adult Health | Admitting: Adult Health

## 2017-10-20 VITALS — BP 108/62 | HR 94 | Temp 97.5°F | Ht 63.0 in | Wt 124.0 lb

## 2017-10-20 DIAGNOSIS — R6889 Other general symptoms and signs: Secondary | ICD-10-CM

## 2017-10-20 DIAGNOSIS — K219 Gastro-esophageal reflux disease without esophagitis: Secondary | ICD-10-CM

## 2017-10-20 DIAGNOSIS — J9621 Acute and chronic respiratory failure with hypoxia: Secondary | ICD-10-CM | POA: Diagnosis not present

## 2017-10-20 DIAGNOSIS — Z1212 Encounter for screening for malignant neoplasm of rectum: Secondary | ICD-10-CM

## 2017-10-20 DIAGNOSIS — Z9181 History of falling: Secondary | ICD-10-CM

## 2017-10-20 DIAGNOSIS — G20C Parkinsonism, unspecified: Secondary | ICD-10-CM

## 2017-10-20 DIAGNOSIS — D128 Benign neoplasm of rectum: Secondary | ICD-10-CM | POA: Diagnosis not present

## 2017-10-20 DIAGNOSIS — M1990 Unspecified osteoarthritis, unspecified site: Secondary | ICD-10-CM

## 2017-10-20 DIAGNOSIS — Z0000 Encounter for general adult medical examination without abnormal findings: Secondary | ICD-10-CM

## 2017-10-20 DIAGNOSIS — R7303 Prediabetes: Secondary | ICD-10-CM

## 2017-10-20 DIAGNOSIS — J449 Chronic obstructive pulmonary disease, unspecified: Secondary | ICD-10-CM

## 2017-10-20 DIAGNOSIS — G609 Hereditary and idiopathic neuropathy, unspecified: Secondary | ICD-10-CM

## 2017-10-20 DIAGNOSIS — I517 Cardiomegaly: Secondary | ICD-10-CM | POA: Diagnosis not present

## 2017-10-20 DIAGNOSIS — E43 Unspecified severe protein-calorie malnutrition: Secondary | ICD-10-CM

## 2017-10-20 DIAGNOSIS — J189 Pneumonia, unspecified organism: Secondary | ICD-10-CM | POA: Diagnosis not present

## 2017-10-20 DIAGNOSIS — J4489 Other specified chronic obstructive pulmonary disease: Secondary | ICD-10-CM

## 2017-10-20 DIAGNOSIS — E782 Mixed hyperlipidemia: Secondary | ICD-10-CM | POA: Diagnosis not present

## 2017-10-20 DIAGNOSIS — R32 Unspecified urinary incontinence: Secondary | ICD-10-CM

## 2017-10-20 DIAGNOSIS — Z79899 Other long term (current) drug therapy: Secondary | ICD-10-CM

## 2017-10-20 DIAGNOSIS — R1312 Dysphagia, oropharyngeal phase: Secondary | ICD-10-CM

## 2017-10-20 DIAGNOSIS — R0689 Other abnormalities of breathing: Secondary | ICD-10-CM

## 2017-10-20 DIAGNOSIS — R0600 Dyspnea, unspecified: Secondary | ICD-10-CM

## 2017-10-20 DIAGNOSIS — R05 Cough: Secondary | ICD-10-CM | POA: Diagnosis not present

## 2017-10-20 DIAGNOSIS — Z7189 Other specified counseling: Secondary | ICD-10-CM

## 2017-10-20 DIAGNOSIS — F325 Major depressive disorder, single episode, in full remission: Secondary | ICD-10-CM

## 2017-10-20 DIAGNOSIS — R011 Cardiac murmur, unspecified: Secondary | ICD-10-CM

## 2017-10-20 DIAGNOSIS — I700 Atherosclerosis of aorta: Secondary | ICD-10-CM

## 2017-10-20 DIAGNOSIS — G232 Striatonigral degeneration: Secondary | ICD-10-CM | POA: Diagnosis not present

## 2017-10-20 DIAGNOSIS — Z0001 Encounter for general adult medical examination with abnormal findings: Secondary | ICD-10-CM | POA: Diagnosis not present

## 2017-10-20 DIAGNOSIS — I119 Hypertensive heart disease without heart failure: Secondary | ICD-10-CM | POA: Diagnosis not present

## 2017-10-20 DIAGNOSIS — J984 Other disorders of lung: Secondary | ICD-10-CM

## 2017-10-20 DIAGNOSIS — Z1211 Encounter for screening for malignant neoplasm of colon: Secondary | ICD-10-CM

## 2017-10-20 DIAGNOSIS — E559 Vitamin D deficiency, unspecified: Secondary | ICD-10-CM

## 2017-10-20 DIAGNOSIS — R0602 Shortness of breath: Secondary | ICD-10-CM | POA: Diagnosis not present

## 2017-10-20 MED ORDER — PREDNISONE 20 MG PO TABS: ORAL_TABLET | ORAL | 0 refills | Status: AC

## 2017-10-20 MED ORDER — ALPRAZOLAM 0.25 MG PO TABS: 0.2500 mg | ORAL_TABLET | Freq: Three times a day (TID) | ORAL | 0 refills | Status: DC | PRN

## 2017-10-20 MED ORDER — DOXYCYCLINE HYCLATE 100 MG PO TABS: 100.0000 mg | ORAL_TABLET | Freq: Two times a day (BID) | ORAL | 0 refills | Status: AC

## 2017-10-20 MED ORDER — CITALOPRAM HYDROBROMIDE 20 MG PO TABS: 20.0000 mg | ORAL_TABLET | Freq: Every day | ORAL | 2 refills | Status: DC

## 2017-10-20 NOTE — Patient Instructions (Signed)
Make sure to take your breathing medications every day:   Budesonide/pulmicort - nebulized - twice daily  Advair OR perforomist - twice daily - perforomist is nebulized and can be added to other budesonide treatment and taken at the same time  Albuterol nebulized - can take every 6 hours as needed for dyspnea  Will start a taper of prednisone - STOP daily prednisone that you normally take, take new taper sent in   Doxycycline - antibiotic daily until runs out    Chronic Obstructive Pulmonary Disease Exacerbation Chronic obstructive pulmonary disease (COPD) is a common lung problem. In COPD, the flow of air from the lungs is limited. COPD exacerbations are times that breathing gets worse and you need extra treatment. Without treatment they can be life threatening. If they happen often, your lungs can become more damaged. If your COPD gets worse, your doctor may treat you with:  Medicines.  Oxygen.  Different ways to clear your airway, such as using a mask.  Follow these instructions at home:  Do not smoke.  Avoid tobacco smoke and other things that bother your lungs.  If given, take your antibiotic medicine as told. Finish the medicine even if you start to feel better.  Only take medicines as told by your doctor.  Drink enough fluids to keep your pee (urine) clear or pale yellow (unless your doctor has told you not to).  Use a cool mist machine (vaporizer).  If you use oxygen or a machine that turns liquid medicine into a mist (nebulizer), continue to use them as told.  Keep up with shots (vaccinations) as told by your doctor.  Exercise regularly.  Eat healthy foods.  Keep all doctor visits as told. Get help right away if:  You are very short of breath and it gets worse.  You have trouble talking.  You have bad chest pain.  You have blood in your spit (sputum).  You have a fever.  You keep throwing up (vomiting).  You feel weak, or you pass out  (faint).  You feel confused.  You keep getting worse. This information is not intended to replace advice given to you by your health care provider. Make sure you discuss any questions you have with your health care provider. Document Released: 09/24/2011 Document Revised: 03/12/2016 Document Reviewed: 06/09/2013 Elsevier Interactive Patient Education  2017 [employer].

## 2017-10-21 ENCOUNTER — Other Ambulatory Visit: Payer: Self-pay | Admitting: Adult Health

## 2017-10-21 DIAGNOSIS — J449 Chronic obstructive pulmonary disease, unspecified: Secondary | ICD-10-CM

## 2017-10-21 DIAGNOSIS — J9621 Acute and chronic respiratory failure with hypoxia: Secondary | ICD-10-CM

## 2017-10-21 LAB — CBC WITH DIFFERENTIAL/PLATELET
Basophils Absolute: 139 cells/uL (ref 0–200)
Basophils Relative: 1.7 %
EOS PCT: 6 %
Eosinophils Absolute: 492 cells/uL (ref 15–500)
HEMATOCRIT: 44.5 % (ref 35.0–45.0)
Hemoglobin: 15.3 g/dL (ref 11.7–15.5)
LYMPHS ABS: 2148 {cells}/uL (ref 850–3900)
MCH: 29.7 pg (ref 27.0–33.0)
MCHC: 34.4 g/dL (ref 32.0–36.0)
MCV: 86.2 fL (ref 80.0–100.0)
MONOS PCT: 8.7 %
MPV: 10.6 fL (ref 7.5–12.5)
NEUTROS ABS: 4707 {cells}/uL (ref 1500–7800)
Neutrophils Relative %: 57.4 %
Platelets: 295 10*3/uL (ref 140–400)
RBC: 5.16 10*6/uL — AB (ref 3.80–5.10)
RDW: 13 % (ref 11.0–15.0)
Total Lymphocyte: 26.2 %
WBC mixed population: 713 cells/uL (ref 200–950)
WBC: 8.2 10*3/uL (ref 3.8–10.8)

## 2017-10-21 LAB — BASIC METABOLIC PANEL WITH GFR
BUN / CREAT RATIO: 18 (calc) (ref 6–22)
BUN: 19 mg/dL (ref 7–25)
CO2: 31 mmol/L (ref 20–32)
CREATININE: 1.05 mg/dL — AB (ref 0.60–0.93)
Calcium: 9.6 mg/dL (ref 8.6–10.4)
Chloride: 101 mmol/L (ref 98–110)
GFR, EST AFRICAN AMERICAN: 58 mL/min/{1.73_m2} — AB (ref >=60)
GFR, EST NON AFRICAN AMERICAN: 50 mL/min/{1.73_m2} — AB (ref >=60)
Glucose, Bld: 98 mg/dL (ref 65–99)
Potassium: 4.3 mmol/L (ref 3.5–5.3)
Sodium: 143 mmol/L (ref 135–146)

## 2017-10-28 ENCOUNTER — Ambulatory Visit (INDEPENDENT_AMBULATORY_CARE_PROVIDER_SITE_OTHER): Payer: Medicare Other | Admitting: Internal Medicine

## 2017-10-28 ENCOUNTER — Encounter: Payer: Self-pay | Admitting: Internal Medicine

## 2017-10-28 VITALS — BP 124/78 | HR 72 | Temp 97.5°F | Resp 16 | Ht 63.0 in | Wt 133.0 lb

## 2017-10-28 DIAGNOSIS — J984 Other disorders of lung: Secondary | ICD-10-CM

## 2017-10-28 DIAGNOSIS — R0989 Other specified symptoms and signs involving the circulatory and respiratory systems: Secondary | ICD-10-CM

## 2017-10-28 DIAGNOSIS — E782 Mixed hyperlipidemia: Secondary | ICD-10-CM

## 2017-10-28 DIAGNOSIS — Z79899 Other long term (current) drug therapy: Secondary | ICD-10-CM

## 2017-10-28 DIAGNOSIS — R7303 Prediabetes: Secondary | ICD-10-CM | POA: Diagnosis not present

## 2017-10-28 DIAGNOSIS — E559 Vitamin D deficiency, unspecified: Secondary | ICD-10-CM | POA: Diagnosis not present

## 2017-10-28 DIAGNOSIS — R7309 Other abnormal glucose: Secondary | ICD-10-CM

## 2017-10-28 NOTE — Progress Notes (Signed)
This very nice 82 y.o. MWF  presents for  follow up with Hypertension, Hyperlipidemia, Parkinson's (+) Syndrome, Restrictive Lung Disease,  Pre-Diabetes and Vitamin D Deficiency.      Patient was hospitalized for 3 days in early Dec for acute respiratory insufficiency felt likely due to AECB & possible small CAP and as she recovered quickly she was d/c'd for f/u as an out-patient. Patient does have mild Dementia and poor insight into her cognitive limitations and requires supervision by her spouse for monitoring her med regimen. She is on O2 at home .      Patient is treated for HTN & BP has been controlled at home. Today's BP is at goal - 124/78. Patient has had no complaints of any cardiac type chest pain, palpitations, dyspnea / orthopnea / PND, dizziness, claudication, or dependent edema.     Hyperlipidemia is controlled with diet & meds. Patient denies myalgias or other med SE's. Last Lipids were  Lab Results  Component Value Date   CHOL 138 05/25/2017   HDL 56 05/25/2017   LDLCALC 61 05/25/2017   TRIG 103 05/25/2017   CHOLHDL 2.5 05/25/2017      Also, the patient has history of PreDiabetes and has had no symptoms of reactive hypoglycemia, diabetic polys, paresthesias or visual blurring.  Last A1c was  Lab Results  Component Value Date   HGBA1C 5.5 02/17/2017      Further, the patient also has history of Vitamin D Deficiency and supplements vitamin D without any suspected side-effects. Last vitamin D was   Lab Results  Component Value Date   VD25OH 45 02/17/2017   Current Outpatient Medications on File Prior to Visit  Medication Sig  . albuterol  (2.5 MG/3ML)  soln Use 1 ampule in nebulizer 4 times a day or every 4 hours.  . ALPRAZolam0.25 MG tablet Take 1 tabx 3 x daily as needed (For anxiety or air hunger).  Marland Kitchen aspirin 81 MG tablet Take 81 mg by mouth daily. Buys OTC  . budesonide  0.5 MG/2ML neb soln Take 2 mLs = by neb 2 x daily. Dx-J45.909, J44.0, J20.9  . SINEMET IR)  25-100 MG  Take 1.5 tablets by mouth 2 (two) times daily.  Marland Kitchen VIT D 2000 units  Take 1 capsule by mouth daily.  . citalopram  20 MG tablet Take 1 tab= daily.  Marland Kitchen ENSURE   daily.  Marland Kitchen ADVAIR DISKUS 250-50  1 puff  2 x daily.  . formoterol  20 MCG/2ML neb soln Take 2 mLs  by neb 2 x daily.  . furosemide  40 MG tablet Take 1 tablet daily for fluid retention or ankle swelling  . olmesartan  20 MG tablet Take 1 tablet daily for BP (replaces Losartan)  . omeprazole 40 MG capsule Take 1 capsule daily for Acid Reflux  . pravastatin  40 MG tablet Take 40 mg by mouth daily.   . predniSONE 10 MG tablet 1tablets daily  . PROCTOZONE-HC 2.5 % rectal crm Apply 1 application topically 2 (two) times daily.  . NaCl HYPERTONIC 3 % neb soln Take 1 vial by nebulization BID    Allergies  Allergen Reactions  . Ertapenem Hives    Caused whole body to turn red Other reaction(s): Redness Caused whole body to turn red  . Codeine Other (See Comments)    hallucinations  . Demerol Other (See Comments)    hallucinations  . Morphine And Related Other (See Comments)  hallucinations  . Other Other (See Comments)    Invan 7- pt became red all over  . Sulfate Other (See Comments)    unknown   PMHx:   Past Medical History:  Diagnosis Date  . Balance disorder 2008.     Falls a lot.  She can be standing and then leans too far over to one side   . Bulging disc   . Chronic bronchitis ([place])    due to congestion at times, on prednisone and advair  . COPD (chronic obstructive pulmonary disease) ([place])   . Depression    many years ago  . GERD (gastroesophageal reflux disease)   . Hearing loss    mild  . Hyperlipidemia   . Hypertension   . Iatrogenic adrenal insufficiency ([place])   . Incontinence    not indicated at this visit.  Marland Kitchen LVH (left ventricular hypertrophy) due to hypertensive disease    a. 02/2017: echo showing an EF of 65-70% with moderate LVH, hyperdynamic LV with subvalvular gradient of 70 mmHg, SAM,  and mild MR  . Neuropathy    legs stay numb   . Osteoarthritis    hands,   . Osteoporosis   . Shortness of breath dyspnea   . Tubulovillous adenoma of rectum   . Wears dentures   . Wears glasses    Immunization History  Administered Date(s) Administered  . Influenza, High Dose Seasonal PF 08/23/2014, 07/24/2015, 08/13/2016, 07/12/2017  . Pneumococcal Conjugate-13 08/23/2014  . Pneumococcal-Unspecified 07/20/2003, 08/17/2013  . Td 08/17/2013  . Tdap 10/25/2012  . Zoster 03/19/2015   Past Surgical History:  Procedure Laterality Date  . ABDOMINAL HYSTERECTOMY    . APPENDECTOMY    . CHONDROPLASTY  09/19/2014   Procedure: CHONDROPLASTY;  Surgeon: Alta Corning, MD;  Location: [facility];  Service: Orthopedics;;  . DILATION AND CURETTAGE OF UTERUS    . EXTERNAL FIXATION LEG  10/25/2012   Procedure: EXTERNAL FIXATION LEG;  Surgeon: Rozanna Box, MD;  Location: [facility];  Service: Orthopedics;  Laterality: Right;  . EYE SURGERY     bilateral cataract surgery and lens implant  . FINGER SURGERY     fusions and debridements for OA  . INCONTINENCE SURGERY     multiple procedures, not cured  . KNEE ARTHROSCOPY WITH LATERAL MENISECTOMY Right 09/19/2014   Procedure: KNEE ARTHROSCOPY WITH LATERAL MENISECTOMY;  Surgeon: Alta Corning, MD;  Location: [facility];  Service: Orthopedics;  Laterality: Right;  . KNEE ARTHROSCOPY WITH MEDIAL MENISECTOMY Right 09/19/2014   Procedure: RIGHT KNEE ARTHROSCOPY WITH MEDIAL AND LATERAL MENISECTOMIES. CHONDROPLASTY OF PATELLA-FEMORAL JOINT;  Surgeon: Alta Corning, MD;  Location: [facility];  Service: Orthopedics;  Laterality: Right;  . RECTAL BIOPSY  09/21/2011   Procedure: BIOPSY RECTAL;  Surgeon: Merrie Roof, MD;  Location: [place];  Service: General;  Laterality: N/A;  3-4 cm  . RECTAL SURGERY     by dr. Marlou Starks, removal of polyp  . TONSILLECTOMY     FHx:    Reviewed / unchanged  SHx:    Reviewed / unchanged    Systems Review:  Constitutional: Denies fever, chills, wt changes, headaches, insomnia, fatigue, night sweats, change in appetite. Eyes: Denies redness, blurred vision, diplopia, discharge, itchy, watery eyes.  ENT: Denies discharge, congestion, post nasal drip, epistaxis, sore throat, earache, hearing loss, dental pain, tinnitus, vertigo, sinus pain, snoring.  CV: Denies chest pain, palpitations, irregular heartbeat, syncope, dyspnea, diaphoresis, orthopnea, PND, claudication  or edema. Respiratory: denies cough, dyspnea, DOE, pleurisy, hoarseness, laryngitis, wheezing.  Gastrointestinal: Denies dysphagia, odynophagia, heartburn, reflux, water brash, abdominal pain or cramps, nausea, vomiting, bloating, diarrhea, constipation, hematemesis, melena, hematochezia  or hemorrhoids. Genitourinary: Denies dysuria, frequency, urgency, nocturia, hesitancy, discharge, hematuria or flank pain. Musculoskeletal: Denies arthralgias, myalgias, stiffness, jt. swelling, pain, limping or strain/sprain.  Skin: Denies pruritus, rash, hives, warts, acne, eczema or change in skin lesion(s). Neuro: No weakness, tremor, incoordination, spasms, paresthesia or pain. Psychiatric: Denies confusion, memory loss or sensory loss. Endo: Denies change in weight, skin or hair change.  Heme/Lymph: No excessive bleeding, bruising or enlarged lymph nodes.  Physical Exam  BP 124/78   Pulse 72   Temp (!) 97.5 F (36.4 C)   Resp 16   Ht 5\' 3"  (1.6 m)   Wt 133 lb (60.3 kg)   SpO2 97%   BMI 23.56 kg/m   Appears chronically ill  and in no acute distress. Patient becomes dyspneic after walking 10-15 feet.   Eyes: PERRLA, EOMs, conjunctiva no swelling or erythema. Sinuses: No frontal/maxillary tenderness ENT/Mouth: EAC's clear, TM's nl w/o erythema, bulging. Nares clear w/o erythema, swelling, exudates. Oropharynx clear without erythema or exudates. Oral hygiene is good. Tongue normal, non obstructing. Hearing intact.  Neck:  Supple. Thyroid nl. Car 2+/2+ without bruits, nodes or JVD. Chest: Respirations nl with BS clear & equal w/o rales, rhonchi, wheezing or stridor.  Cor: Heart sounds soft w/ regular rate and rhythm with coarse blowing para sternal holosystolic murmur. Peripheral pulses normal and equal  without edema.  Abdomen: Soft, scaphoid & bowel sounds normal. Non-tender w/o guarding, rebound, hernias, masses or organomegaly.  Lymphatics: Unremarkable.  Musculoskeletal: Generalized wasting and walks with a rollator support.  Skin: Warm, dry without exposed rashes, lesions or ecchymosis apparent.  Neuro: Cranial nerves intact, reflexes equal bilaterally. Sensory-motor testing grossly intact. Tendon reflexes grossly intact.  Pysch: Alert & oriented x 3.  Insight and judgement nl & appropriate. No ideations.  Assessment and Plan:  1. Labile hypertension  - Continue medication, monitor blood pressure at home.  - Continue DASH diet. Reminder to go to the ER if any CP,  SOB, nausea, dizziness, severe HA, changes vision/speech.  - Magnesium - TSH  2. Hyperlipidemia, mixed  - Continue diet/meds, exercise,& lifestyle modifications.  - Continue monitor periodic cholesterol/liver & renal functions   - TSH  3. Prediabetes  - Continue diet, exercise, lifestyle modifications.  - Monitor appropriate labs.  - Hemoglobin A1c - Insulin, random  4. Vitamin D deficiency  - Continue supplementation.  - VITAMIN D 25 Hydroxy  5. Abnormal glucose  - Hemoglobin A1c - Insulin, random  6. Restrictive lung disease  7. Medication management  - Magnesium - TSH - Hemoglobin A1c - Insulin, random - VITAMIN D 25 Hydroxy        Discussed  regular exercise, BP monitoring, weight control to achieve/maintain BMI less than 25 and discussed med and SE's. Recommended labs to assess and monitor clinical status with further disposition pending results of labs. Over 30 minutes of exam, counseling, chart review was  performed.

## 2017-10-28 NOTE — Patient Instructions (Signed)

## 2017-10-29 LAB — INSULIN, RANDOM: Insulin: 3.3 u[IU]/mL (ref 2.0–19.6)

## 2017-10-29 LAB — VITAMIN D 25 HYDROXY (VIT D DEFICIENCY, FRACTURES): Vit D, 25-Hydroxy: 35 ng/mL (ref 30–100)

## 2017-10-29 LAB — HEMOGLOBIN A1C
EAG (MMOL/L): 6 (calc)
Hgb A1c MFr Bld: 5.4 % of total Hgb (ref <5.7)
MEAN PLASMA GLUCOSE: 108 (calc)

## 2017-10-29 LAB — TSH: TSH: 1.87 mIU/L (ref 0.40–4.50)

## 2017-10-29 LAB — MAGNESIUM: Magnesium: 2.2 mg/dL (ref 1.5–2.5)

## 2017-11-11 ENCOUNTER — Encounter: Payer: Self-pay | Admitting: Adult Health

## 2017-11-11 ENCOUNTER — Ambulatory Visit (INDEPENDENT_AMBULATORY_CARE_PROVIDER_SITE_OTHER): Payer: Medicare Other | Admitting: Adult Health

## 2017-11-11 VITALS — BP 104/64 | HR 93 | Temp 97.9°F | Ht 63.0 in | Wt 131.0 lb

## 2017-11-11 DIAGNOSIS — Z1211 Encounter for screening for malignant neoplasm of colon: Secondary | ICD-10-CM | POA: Diagnosis not present

## 2017-11-11 DIAGNOSIS — R0989 Other specified symptoms and signs involving the circulatory and respiratory systems: Secondary | ICD-10-CM

## 2017-11-11 DIAGNOSIS — Z91199 Patient's noncompliance with other medical treatment and regimen due to unspecified reason: Secondary | ICD-10-CM

## 2017-11-11 DIAGNOSIS — J9621 Acute and chronic respiratory failure with hypoxia: Secondary | ICD-10-CM

## 2017-11-11 DIAGNOSIS — R600 Localized edema: Secondary | ICD-10-CM

## 2017-11-11 DIAGNOSIS — J449 Chronic obstructive pulmonary disease, unspecified: Secondary | ICD-10-CM | POA: Diagnosis not present

## 2017-11-11 DIAGNOSIS — Z9119 Patient's noncompliance with other medical treatment and regimen: Secondary | ICD-10-CM

## 2017-11-11 DIAGNOSIS — R062 Wheezing: Secondary | ICD-10-CM | POA: Diagnosis not present

## 2017-11-11 DIAGNOSIS — R05 Cough: Secondary | ICD-10-CM

## 2017-11-11 DIAGNOSIS — I517 Cardiomegaly: Secondary | ICD-10-CM

## 2017-11-11 DIAGNOSIS — R0602 Shortness of breath: Secondary | ICD-10-CM

## 2017-11-11 DIAGNOSIS — R059 Cough, unspecified: Secondary | ICD-10-CM

## 2017-11-11 MED ORDER — LEVOFLOXACIN 500 MG PO TABS: 500.0000 mg | ORAL_TABLET | Freq: Every day | ORAL | 0 refills | Status: AC

## 2017-11-11 MED ORDER — PREDNISONE 20 MG PO TABS: ORAL_TABLET | ORAL | 0 refills | Status: DC

## 2017-11-11 MED ORDER — IPRATROPIUM-ALBUTEROL 0.5-2.5 (3) MG/3ML IN SOLN: 3.0000 mL | Freq: Once | RESPIRATORY_TRACT | Status: DC

## 2017-11-11 NOTE — Patient Instructions (Addendum)
Please be sure you are taking budesonide (pulmicort) AND perforomist (formoterol fumarate) - can put together in nebulizer machine and take TWICE A DAY  Take albuterol nebulizer AS NEEDED ONLY for wheezing or shortness of breath  Be sure you are taking LASIX 40 mg DAILY. This is for swelling. Please let me know if you have been taking this daily or as needed. If you have been taking daily, we may need to increase dose. Please take 80 mg (2 tabs) when you get home today. I will let you know if you need to keep doing this when we call you tomorrow with lab results.     Do the following things EVERYDAY: 1) Weigh yourself in the morning before breakfast or at the same time every day. Write it down and keep it in a log. 2) Take your medicines as prescribed 3) Eat low salt foods-Limit salt (sodium) to 2000 mg per day. Best thing to do is avoid processed foods.   4) Stay as active as you can everyday 5) Limit all fluids for the day to less than 1.5 liters  Call your doctor if:  Anytime you have any of the following symptoms:  1) 2 pound weight gain in 24 hours or 5 pounds in 1 week  2) shortness of breath, with or without a dry hacking cough  3) swelling in the hands, LEGs, feet or stomach  4) if you have to sleep on extra pillows at night in order to breathe. 5) after laying down at night for 20-30 mins, you wake up short of breath.   These can all be signs of fluid overload.    Heart Failure Heart failure means your heart has trouble pumping blood. This makes it hard for your body to work well. Heart failure is usually a long-term (chronic) condition. You must take good care of yourself and follow your doctor's treatment plan. Follow these instructions at home:  Take your heart medicine as told by your doctor. ? Do not stop taking medicine unless your doctor tells you to. ? Do not skip any dose of medicine. ? Refill your medicines before they run out. ? Take other medicines only as told  by your doctor or pharmacist.  Stay active if told by your doctor. The elderly and people with severe heart failure should talk with a doctor about physical activity.  Eat heart-healthy foods. Choose foods that are without trans fat and are low in saturated fat, cholesterol, and salt (sodium). This includes fresh or frozen fruits and vegetables, fish, lean meats, fat-free or low-fat dairy foods, whole grains, and high-fiber foods. Lentils and dried peas and beans (legumes) are also good choices.  Limit salt if told by your doctor.  Cook in a healthy way. Roast, grill, broil, bake, poach, steam, or stir-fry foods.  Limit fluids as told by your doctor.  Weigh yourself every morning. Do this after you pee (urinate) and before you eat breakfast. Write down your weight to give to your doctor.  Take your blood pressure and write it down if your doctor tells you to.  Ask your doctor how to check your pulse. Check your pulse as told.  Lose weight if told by your doctor.  Stop smoking or chewing tobacco. Do not use gum or patches that help you quit without your doctor's approval.  Schedule and go to doctor visits as told.  Nonpregnant women should have no more than 1 drink a day. Men should have no more than 2  drinks a day. Talk to your doctor about drinking alcohol.  Stop illegal drug use.  Stay current with shots (immunizations).  Manage your health conditions as told by your doctor.  Learn to manage your stress.  Rest when you are tired.  If it is really hot outside: ? Avoid intense activities. ? Use air conditioning or fans, or get in a cooler place. ? Avoid caffeine and alcohol. ? Wear loose-fitting, lightweight, and light-colored clothing.  If it is really cold outside: ? Avoid intense activities. ? Layer your clothing. ? Wear mittens or gloves, a hat, and a scarf when going outside. ? Avoid alcohol.  Learn about heart failure and get support as needed.  Get help to  maintain or improve your quality of life and your ability to care for yourself as needed. Contact a doctor if:  You gain weight quickly.  You are more short of breath than usual.  You cannot do your normal activities.  You tire easily.  You cough more than normal, especially with activity.  You have any or more puffiness (swelling) in areas such as your hands, feet, ankles, or belly (abdomen).  You cannot sleep because it is hard to breathe.  You feel like your heart is beating fast (palpitations).  You get dizzy or light-headed when you stand up. Get help right away if:  You have trouble breathing.  There is a change in mental status, such as becoming less alert or not being able to focus.  You have chest pain or discomfort.  You faint. This information is not intended to replace advice given to you by your health care provider. Make sure you discuss any questions you have with your health care provider. Document Released: 07/14/2008 Document Revised: 03/12/2016 Document Reviewed: 11/21/2012 Elsevier Interactive Patient Education  2017 [employer].

## 2017-11-11 NOTE — Progress Notes (Signed)
Assessment and Plan:  Chapel was seen today for uri.  Diagnoses and all orders for this visit:  Abnormal lung sounds/ Wheezing on auscultation/ Cough Patient has been on extensive abx in past few months; will treat proactively for pneumonia -     Cancel: DG Chest 2 View; Future - patient refused -     ipratropium-albuterol (DUONEB) 0.5-2.5 (3) MG/3ML nebulizer solution 3 mL Significantly improved  -     levofloxacin (LEVAQUIN) 500 MG tablet; Take 1 tablet (500 mg total) by mouth daily for 5 days. -     predniSONE (DELTASONE) 20 MG tablet; 2 tablets daily for 3 days, 1 tablet daily for 4 days.       Follow up in 3-4 days       Go to ER for progressing edema, dyspnea, chest pain, dizziness, altered mental status or other concerning symptoms over the weekend.              LVH (left ventricular hypertrophy) -     Brain natriuretic peptide -     Discussed CHF principles with patient - to verify whether she is taking lasix daily - take80 mg today when she gets home. Will let her know whether to continue this tomorrow morning when we call back for labs.  Cardiology referral in place - will follow up on status of this for worsening murmur possibly progressing into heart failure  Shortness of breath -     CBC with Differential/Platelet -     BASIC METABOLIC PANEL WITH GFR -     Brain natriuretic peptide  Problem with medical care compliance  Patient and husband are clearly having difficulty following medication regimen as recommended despite repetitive efforts to clarify, simplify and reinforce, likely contributing to poor medical outcomes and recent ER visits and hospitalizations.  - Home health referral for medication management placed  Further disposition pending results of labs. Discussed med's effects and SE's.   Over 30 minutes of exam, counseling, chart review, and critical decision making was performed.   Future Appointments  Date Time Provider [place]  01/26/2018 11:00  AM Liane Comber, NP [facility] None  03/04/2018 11:30 AM Narda Amber K, DO [facility] None  05/12/2018 11:00 AM [name], MD [facility] None    ------------------------------------------------------------------------------------------------------------------   HPI BP 104/64   Pulse 93   Temp 97.9 F (36.6 C)   Ht 5\' 3"  (1.6 m)   Wt 131 lb (59.4 kg)   SpO2 95%   BMI 23.21 kg/m   82 y.o.female with hx of Parkinson's plus syndrome, LVH, COPD/restrictive lung disease frequently evaluated for acute on chronic respiratory failure -  presents for cough x 3-4 days. She reports intermittently productive of small amount of yellow phlegm. She denies sore throat, nasal congestion, dyspnea, chest pain, rash, dizziness, fever/chills. O2 sats 91% on RA, RR 22 by provider.   She reports dyspnea consistent with baseline - "I feel pretty good today" - but demonstrates increased lower extremity edema; R with pitting edema 3+ left ankle with 2+ - reviewed medication list - discussed current lasix use with patient - she cannot recall whether she is taking this daily or if she has taken this today. She does not currently have any dx of CHF, but has documented hx of LVH with progressing murmur noted at last 2 visits. ECHO 03/2017 - LV EF 55-60%, noted mild regurdigation of mitral valve but was otherwise unremarkable. She has been referred back to cardiology for reeval and possible ECHO  repeat but this is pending.   Reviewed medications with patient and husband today - report she has been taking budesonide and perforomist once daily only - she has been utilizing albuterol frequently.    Wt Readings from Last 3 Encounters:  11/11/17 131 lb (59.4 kg)  10/28/17 133 lb (60.3 kg)  10/20/17 124 lb (56.2 kg)    Past Medical History:  Diagnosis Date  . Balance disorder 2008.     Falls a lot.  She can be standing and then leans too far over to one side   . Bulging disc   . Chronic bronchitis ([place])    due  to congestion at times, on prednisone and advair  . COPD (chronic obstructive pulmonary disease) ([place])   . Depression    many years ago  . GERD (gastroesophageal reflux disease)   . Hearing loss    mild  . Hyperlipidemia   . Hypertension   . Iatrogenic adrenal insufficiency ([place])   . Incontinence    not indicated at this visit.  Marland Kitchen LVH (left ventricular hypertrophy) due to hypertensive disease    a. 02/2017: echo showing an EF of 65-70% with moderate LVH, hyperdynamic LV with subvalvular gradient of 70 mmHg, SAM, and mild MR  . Neuropathy    legs stay numb   . Osteoarthritis    hands,   . Osteoporosis   . Shortness of breath dyspnea   . Tubulovillous adenoma of rectum   . Wears dentures   . Wears glasses      Allergies  Allergen Reactions  . Ertapenem Hives    Caused whole body to turn red Other reaction(s): Redness Caused whole body to turn red  . Codeine Other (See Comments)    hallucinations  . Demerol Other (See Comments)    hallucinations  . Morphine And Related Other (See Comments)    hallucinations  . Other Other (See Comments)    Invan 7- pt became red all over  . Sulfate Other (See Comments)    unknown    Current Outpatient Medications on File Prior to Visit  Medication Sig  . albuterol (PROVENTIL) (2.5 MG/3ML) 0.083% nebulizer solution Take 3 mLs (2.5 mg total) by nebulization every 6 (six) hours as needed. Use 1 ampule in nebulizer 4 times a day or every 4 hours.  . ALPRAZolam (XANAX) 0.25 MG tablet Take 1 tablet (0.25 mg total) by mouth 3 (three) times daily as needed (For anxiety or air hunger).  Marland Kitchen aspirin 81 MG tablet Take 81 mg by mouth daily. Buys OTC  . budesonide (PULMICORT) 0.5 MG/2ML nebulizer solution Take 2 mLs (0.5 mg total) by nebulization 2 (two) times daily. Dx-J45.909, J44.0, J20.9  . carbidopa-levodopa (SINEMET IR) 25-100 MG tablet Take 1.5 tablets by mouth 2 (two) times daily.  . Cholecalciferol (VITAMIN D) 2000 units CAPS Take 1 capsule  by mouth daily.  . citalopram (CELEXA) 20 MG tablet Take 1 tablet (20 mg total) by mouth daily.  Marland Kitchen ENSURE (ENSURE) Take 237 mLs by mouth daily.  . Fluticasone-Salmeterol (ADVAIR DISKUS) 250-50 MCG/DOSE AEPB Inhale 1 puff into the lungs 2 (two) times daily.  . formoterol (PERFOROMIST) 20 MCG/2ML nebulizer solution Take 2 mLs (20 mcg total) by nebulization 2 (two) times daily.  . furosemide (LASIX) 40 MG tablet Take 1 tablet daily for fluid retention or an=kle swelling  . olmesartan (BENICAR) 20 MG tablet Take 1 tablet daily for BP (replaces Losartan)  . omeprazole (PRILOSEC) 40 MG capsule Take 1 capsule daily  for Acid Reflux  . pravastatin (PRAVACHOL) 40 MG tablet Take 40 mg by mouth daily.   Marland Kitchen PROCTOZONE-HC 2.5 % rectal cream Apply 1 application topically 2 (two) times daily.  . sodium chloride HYPERTONIC 3 % nebulizer solution Take 1 vial by nebulization BID   No current facility-administered medications on file prior to visit.     ROS: Review of Systems  Constitutional: Negative for chills, diaphoresis, fever and malaise/fatigue.  HENT: Positive for congestion. Negative for ear discharge, ear pain, hearing loss, sinus pain, sore throat and tinnitus.   Eyes: Negative for blurred vision, pain, discharge and redness.  Respiratory: Positive for cough and sputum production. Negative for hemoptysis, shortness of breath, wheezing and stridor.   Cardiovascular: Positive for leg swelling. Negative for chest pain, palpitations, orthopnea, claudication and PND.  Gastrointestinal: Negative for abdominal pain, diarrhea, nausea and vomiting.  Genitourinary: Negative.   Musculoskeletal: Negative for joint pain and myalgias.  Skin: Negative for rash.  Neurological: Negative for dizziness, sensory change, weakness and headaches.  Endo/Heme/Allergies: Negative for environmental allergies.  Psychiatric/Behavioral: Negative.   All other systems reviewed and are negative.   Physical Exam:  BP 104/64    Pulse 93   Temp 97.9 F (36.6 C)   Ht 5\' 3"  (1.6 m)   Wt 131 lb (59.4 kg)   SpO2 95%   BMI 23.21 kg/m   General Appearance: Well nourished, in no acute distress. Eyes: PERRLA, EOMs, conjunctiva no swelling or erythema Sinuses: No Frontal/maxillary tenderness ENT/Mouth: Ext aud canals clear, TMs without erythema, bulging. No erythema, swelling, or exudate on post pharynx.  Tonsils not swollen or erythematous. Hearing normal.  Neck: Supple, thyroid normal.  Respiratory: Respiratory effort mildly increase, BS with bilateral expiratory wheezing, crackles throughout lobes, lung sounds diminished in bilateral bases. Does not clear with cough.  Cardio: RRR with harsh 5/6 blowing/mechanical murmur heard throughout. 1+ peripheral pulses present, RLE with 3+ pitting edema, LLE with 2+ edema.  Abdomen: Soft, + BS.  Non tender, no guarding, rebound, hernias, masses. Lymphatics: Non tender without lymphadenopathy.  Musculoskeletal: Symmetrical strength, limited to wheelchair.  Skin: Warm, dry, thin frail skin.  Psych: Awake and oriented X 3, normal affect, poor insight and judgement.    Izora Ribas, NP 5:30 PM [facility]

## 2017-11-12 LAB — BASIC METABOLIC PANEL WITH GFR
BUN/Creatinine Ratio: 16 (calc) (ref 6–22)
BUN: 20 mg/dL (ref 7–25)
CALCIUM: 9.4 mg/dL (ref 8.6–10.4)
CO2: 30 mmol/L (ref 20–32)
Chloride: 106 mmol/L (ref 98–110)
Creat: 1.24 mg/dL — ABNORMAL HIGH (ref 0.60–0.93)
GFR, Est African American: 48 mL/min/{1.73_m2} — ABNORMAL LOW (ref >=60)
GFR, Est Non African American: 41 mL/min/{1.73_m2} — ABNORMAL LOW (ref >=60)
GLUCOSE: 100 mg/dL — AB (ref 65–99)
POTASSIUM: 4.5 mmol/L (ref 3.5–5.3)
Sodium: 145 mmol/L (ref 135–146)

## 2017-11-12 LAB — CBC WITH DIFFERENTIAL/PLATELET
BASOS PCT: 1.1 %
Basophils Absolute: 98 cells/uL (ref 0–200)
EOS ABS: 828 {cells}/uL — AB (ref 15–500)
Eosinophils Relative: 9.3 %
HCT: 43.3 % (ref 35.0–45.0)
Hemoglobin: 15 g/dL (ref 11.7–15.5)
Lymphs Abs: 2207 cells/uL (ref 850–3900)
MCH: 29.9 pg (ref 27.0–33.0)
MCHC: 34.6 g/dL (ref 32.0–36.0)
MCV: 86.3 fL (ref 80.0–100.0)
MONOS PCT: 7 %
MPV: 10.8 fL (ref 7.5–12.5)
Neutro Abs: 5144 cells/uL (ref 1500–7800)
Neutrophils Relative %: 57.8 %
PLATELETS: 205 10*3/uL (ref 140–400)
RBC: 5.02 10*6/uL (ref 3.80–5.10)
RDW: 13 % (ref 11.0–15.0)
TOTAL LYMPHOCYTE: 24.8 %
WBC mixed population: 623 cells/uL (ref 200–950)
WBC: 8.9 10*3/uL (ref 3.8–10.8)

## 2017-11-12 LAB — BRAIN NATRIURETIC PEPTIDE: BRAIN NATRIURETIC PEPTIDE: 493 pg/mL — AB (ref <100)

## 2017-11-15 ENCOUNTER — Ambulatory Visit (HOSPITAL_BASED_OUTPATIENT_CLINIC_OR_DEPARTMENT_OTHER)
Admission: RE | Admit: 2017-11-15 | Discharge: 2017-11-15 | Disposition: A | Discharge status: Home / Self Care | Payer: Medicare Other | Source: Ambulatory Visit | Attending: Cardiology | Admitting: Cardiology

## 2017-11-15 ENCOUNTER — Ambulatory Visit (INDEPENDENT_AMBULATORY_CARE_PROVIDER_SITE_OTHER): Payer: Medicare Other | Admitting: Cardiology

## 2017-11-15 ENCOUNTER — Encounter: Payer: Self-pay | Admitting: Cardiology

## 2017-11-15 ENCOUNTER — Encounter (HOSPITAL_BASED_OUTPATIENT_CLINIC_OR_DEPARTMENT_OTHER): Payer: Self-pay

## 2017-11-15 VITALS — BP 110/72 | HR 76 | Ht 63.0 in | Wt 122.0 lb

## 2017-11-15 DIAGNOSIS — R600 Localized edema: Secondary | ICD-10-CM | POA: Diagnosis not present

## 2017-11-15 DIAGNOSIS — Q244 Congenital subaortic stenosis: Secondary | ICD-10-CM | POA: Diagnosis not present

## 2017-11-15 DIAGNOSIS — E782 Mixed hyperlipidemia: Secondary | ICD-10-CM | POA: Diagnosis not present

## 2017-11-15 DIAGNOSIS — J449 Chronic obstructive pulmonary disease, unspecified: Secondary | ICD-10-CM

## 2017-11-15 DIAGNOSIS — R011 Cardiac murmur, unspecified: Secondary | ICD-10-CM | POA: Diagnosis not present

## 2017-11-15 NOTE — Progress Notes (Signed)
Cardiology Office Note:    Date:  11/15/2017   ID:  Marie [name], DOB 1936/09/19, MRN [id number]  PCP:  [name], MD  Cardiologist:  Jenean Lindau, MD   Referring MD: Liane Comber, NP    ASSESSMENT:    1. Subvalvular aortic stenosis determined by imaging   2. COPD with chronic bronchitis ([place])   3. Mixed hyperlipidemia   4. Murmur   5. Pedal edema    PLAN:    In order of problems listed above:  1. Patient has sub aortic obstruction and thickened left ventricle with features of hypertrophy which have been determined in the past to give her a gradient across the left ventricular outflow tract.  Patient is completely asymptomatic with activities of daily living.  She is an elderly woman with multiple comorbidities and very frail lady.  In view of this and the asymptomatic nature of her condition no active intervention is planned at this time.  Echocardiogram will be done as a repeat to follow the subvalvular aortic stenosis and orthopnea.  Her blood pressure is stable.Patient will be seen in follow-up appointment in 6 months or earlier if the patient has any concerns.   Medication Adjustments/Labs and Tests Ordered: Current medicines are reviewed at length with the patient today.  Concerns regarding medicines are outlined above.  Orders Placed This Encounter  Procedures  . EKG 12-Lead  . ECHOCARDIOGRAM COMPLETE   No orders of the defined types were placed in this encounter.    History of Present Illness:    Marie [name] is a 82 y.o. female who is being seen today for the evaluation of cardiac murmur at the request of Liane Comber, NP.  Patient is a pleasant 82 year old female and is accompanied by her husband for this visit.  Her primary care physician referred her here for murmur heard on auscultation or rather a worsening murmur.  Patient mentions to me that she does have a cardiac murmur and an abnormality in the heart which is the cause for her murmur.   No chest pain orthopnea or PND.  Patient has COPD and ambulates with a walker.  She denies any syncope or any shortness of breath.  At the time of my evaluation she is alert awake oriented and in no distress.  She appears very frail.  Her husband mentions to me that her right leg is swollen greater than the left and this is been true in the past few days.  Past Medical History:  Diagnosis Date  . Balance disorder 2008.     Falls a lot.  She can be standing and then leans too far over to one side   . Bulging disc   . Chronic bronchitis ([place])    due to congestion at times, on prednisone and advair  . COPD (chronic obstructive pulmonary disease) ([place])   . Depression    many years ago  . GERD (gastroesophageal reflux disease)   . Hearing loss    mild  . Hyperlipidemia   . Hypertension   . Iatrogenic adrenal insufficiency ([place])   . Incontinence    not indicated at this visit.  Marland Kitchen LVH (left ventricular hypertrophy) due to hypertensive disease    a. 02/2017: echo showing an EF of 65-70% with moderate LVH, hyperdynamic LV with subvalvular gradient of 70 mmHg, SAM, and mild MR  . Neuropathy    legs stay numb   . Osteoarthritis    hands,   . Osteoporosis   .  Shortness of breath dyspnea   . Tubulovillous adenoma of rectum   . Wears dentures   . Wears glasses     Past Surgical History:  Procedure Laterality Date  . ABDOMINAL HYSTERECTOMY    . APPENDECTOMY    . CHONDROPLASTY  09/19/2014   Procedure: CHONDROPLASTY;  Surgeon: Alta Corning, MD;  Location: [facility];  Service: Orthopedics;;  . DILATION AND CURETTAGE OF UTERUS    . EXTERNAL FIXATION LEG  10/25/2012   Procedure: EXTERNAL FIXATION LEG;  Surgeon: Rozanna Box, MD;  Location: [facility];  Service: Orthopedics;  Laterality: Right;  . EYE SURGERY     bilateral cataract surgery and lens implant  . FINGER SURGERY     fusions and debridements for OA  . INCONTINENCE SURGERY     multiple procedures, not cured  . KNEE  ARTHROSCOPY WITH LATERAL MENISECTOMY Right 09/19/2014   Procedure: KNEE ARTHROSCOPY WITH LATERAL MENISECTOMY;  Surgeon: Alta Corning, MD;  Location: [facility];  Service: Orthopedics;  Laterality: Right;  . KNEE ARTHROSCOPY WITH MEDIAL MENISECTOMY Right 09/19/2014   Procedure: RIGHT KNEE ARTHROSCOPY WITH MEDIAL AND LATERAL MENISECTOMIES. CHONDROPLASTY OF PATELLA-FEMORAL JOINT;  Surgeon: Alta Corning, MD;  Location: [facility];  Service: Orthopedics;  Laterality: Right;  . RECTAL BIOPSY  09/21/2011   Procedure: BIOPSY RECTAL;  Surgeon: Merrie Roof, MD;  Location: [facility];  Service: General;  Laterality: N/A;  3-4 cm  . RECTAL SURGERY     by dr. Marlou Starks, removal of polyp  . TONSILLECTOMY      Current Medications: Current Meds  Medication Sig  . albuterol (PROVENTIL) (2.5 MG/3ML) 0.083% nebulizer solution Take 3 mLs (2.5 mg total) by nebulization every 6 (six) hours as needed. Use 1 ampule in nebulizer 4 times a day or every 4 hours.  . ALPRAZolam (XANAX) 0.25 MG tablet Take 1 tablet (0.25 mg total) by mouth 3 (three) times daily as needed (For anxiety or air hunger).  Marland Kitchen aspirin 81 MG tablet Take 81 mg by mouth daily. Buys OTC  . budesonide (PULMICORT) 0.5 MG/2ML nebulizer solution Take 2 mLs (0.5 mg total) by nebulization 2 (two) times daily. Dx-J45.909, J44.0, J20.9  . carbidopa-levodopa (SINEMET IR) 25-100 MG tablet Take 1.5 tablets by mouth 2 (two) times daily.  . Cholecalciferol (VITAMIN D) 2000 units CAPS Take 1 capsule by mouth daily.  . citalopram (CELEXA) 20 MG tablet Take 1 tablet (20 mg total) by mouth daily.  Marland Kitchen ENSURE (ENSURE) Take 237 mLs by mouth daily.  . formoterol (PERFOROMIST) 20 MCG/2ML nebulizer solution Take 2 mLs (20 mcg total) by nebulization 2 (two) times daily.  . furosemide (LASIX) 40 MG tablet Take 1 tablet daily for fluid retention or an=kle swelling  . levofloxacin (LEVAQUIN) 500 MG tablet Take 1 tablet (500 mg total) by mouth daily for 5  days.  Marland Kitchen olmesartan (BENICAR) 20 MG tablet Take 1 tablet daily for BP (replaces Losartan)  . omeprazole (PRILOSEC) 40 MG capsule Take 1 capsule daily for Acid Reflux  . pravastatin (PRAVACHOL) 40 MG tablet Take 40 mg by mouth daily.   . predniSONE (DELTASONE) 20 MG tablet 2 tablets daily for 3 days, 1 tablet daily for 4 days.  Marland Kitchen PROCTOZONE-HC 2.5 % rectal cream Apply 1 application topically 2 (two) times daily.  . sodium chloride HYPERTONIC 3 % nebulizer solution Take 1 vial by nebulization BID   Current Facility-Administered Medications for the 11/15/17 encounter (Office Visit) with Kenidi Elenbaas,  Reita Cliche, MD  Medication  . ipratropium-albuterol (DUONEB) 0.5-2.5 (3) MG/3ML nebulizer solution 3 mL     Allergies:   Ertapenem; Codeine; Demerol; Morphine and related; Other; and Sulfate   Social History   Socioeconomic History  . Marital status: Married    Spouse name: None  . Number of children: 1  . Years of education: 73  . Highest education level: None  Social Needs  . Financial resource strain: None  . Food insecurity - worry: None  . Food insecurity - inability: None  . Transportation needs - medical: None  . Transportation needs - non-medical: None  Occupational History  . None  Tobacco Use  . Smoking status: Never Smoker  . Smokeless tobacco: Never Used  Substance and Sexual Activity  . Alcohol use: No  . Drug use: No  . Sexual activity: None  Other Topics Concern  . None  Social History Narrative   Lives in a house that is mostly single story.  She uses a cane and a walker which she uses for ambulation.  Drives.  Lives with husband.  Has one daughter.  Retired Building control surveyor.  Education: high school.     Family History: The patient's family history includes COPD in her mother; Cancer in her sister; Heart attack in her father; High blood pressure in her mother.  ROS:   Please see the history of present illness.    All other systems reviewed and are  negative.  EKGs/Labs/Other Studies Reviewed:    The following studies were reviewed today: EKG reveals sinus rhythm and nonspecific ST-T changes records from the chart including echocardiogram was reviewed.   Recent Labs: 08/05/2017: ALT 5 10/28/2017: Magnesium 2.2; TSH 1.87 11/11/2017: Brain Natriuretic Peptide 493; BUN 20; Creat 1.24; Hemoglobin 15.0; Platelets 205; Potassium 4.5; Sodium 145  Recent Lipid Panel    Component Value Date/Time   CHOL 138 05/25/2017 1103   TRIG 103 05/25/2017 1103   HDL 56 05/25/2017 1103   CHOLHDL 2.5 05/25/2017 1103   VLDL 21 05/25/2017 1103   LDLCALC 61 05/25/2017 1103    Physical Exam:    VS:  BP 110/72 (BP Location: Right Arm, Patient Position: Sitting, Cuff Size: Normal)   Pulse 76   Ht 5\' 3"  (1.6 m)   Wt 122 lb (55.3 kg)   SpO2 94%   BMI 21.61 kg/m     Wt Readings from Last 3 Encounters:  11/15/17 122 lb (55.3 kg)  11/11/17 131 lb (59.4 kg)  10/28/17 133 lb (60.3 kg)     GEN: Patient is in no acute distress HEENT: Normal NECK: No JVD; No carotid bruits LYMPHATICS: No lymphadenopathy CARDIAC: S1 S2 regular, 2/6 systolic murmur at the apex. RESPIRATORY:  Clear to auscultation without rales, wheezing or rhonchi  ABDOMEN: Soft, non-tender, non-distended MUSCULOSKELETAL:  No edema; No deformity  SKIN: Warm and dry NEUROLOGIC:  Alert and oriented x 3 PSYCHIATRIC:  Normal affect    Signed, Jenean Lindau, MD  11/15/2017 12:00 PM    [place]

## 2017-11-15 NOTE — Addendum Note (Signed)
Addended by: Mattie Marlin on: 11/15/2017 01:09 PM   Modules accepted: Orders

## 2017-11-15 NOTE — Addendum Note (Signed)
Addended by: Aleatha Borer on: 11/15/2017 12:41 PM   Modules accepted: Orders

## 2017-11-15 NOTE — Patient Instructions (Addendum)
Medication Instructions:  Your physician recommends that you continue on your current medications as directed. Please refer to the Current Medication list given to you today.  Labwork: None  Testing/Procedures: Your physician has requested that you have an echocardiogram. Echocardiography is a painless test that uses sound waves to create images of your heart. It provides your doctor with information about the size and shape of your heart and how well your heart's chambers and valves are working. This procedure takes approximately one hour. There are no restrictions for this procedure.  Your physician has requested that you have a lower or upper extremity venous duplex. This test is an ultrasound of the veins in the legs or arms. It looks at venous blood flow that carries blood from the heart to the legs or arms. Allow one hour for a Lower Venous exam. Allow thirty minutes for an Upper Venous exam. There are no restrictions or special instructions.  Follow-Up: Your physician recommends that you schedule a follow-up appointment in: 6 months  Any Other Special Instructions Will Be Listed Below (If Applicable).     If you need a refill on your cardiac medications before your next appointment, please call your pharmacy.   Benjamin, RN, BSN  Vascular Ultrasound An ultrasound, also called sonography or ultrasonography, uses harmless sound waves to take pictures of the inside of your body. The pictures are taken with a device called a transducer that is held up against your body. The continually changing pictures can be recorded on videotape or film. A vascular ultrasound is a painless test to see if you have blood flow problems or clots in your blood vessels. It may be done to look at blood vessels almost anywhere in the body. There are several types of ultrasounds that can be done to look at the blood vessels. They include:  Continuous wave Doppler ultrasound. This type of  ultrasound uses the change in pitch of sound waves to provide information about blood flow through a blood vessel. During the test, a health care provider listens to the sounds produced by the transducer.  Duplex ultrasound. This type of ultrasound uses standard ultrasound methods to produce a picture of a blood vessel and surrounding organs. In addition, a computer provides information about the speed and direction of blood flow through the blood vessel. With this type of ultrasound it is possible to see the structures inside the body and to evaluate blood flow within those structures at the same time.  Color Doppler ultrasound. This type of ultrasound uses standard ultrasound methods to produce a picture of a blood vessel. In addition, a computer converts the Doppler sounds into colors that are overlaid on the picture of the blood vessel. These colors represent the speed and direction of blood flow through the vessel.  Power Doppler ultrasound. This type of ultrasound is up to five times more sensitive than color Doppler ultrasound. Power Doppler ultrasound can also get pictures that are difficult or impossible to get using standard color Doppler ultrasound. Power Doppler ultrasound is most commonly used to evaluate blood flow through vessels within organs, such as the liver or kidneys.  Transcranial Doppler ultrasound. This type of ultrasound looks at blood flow in blood vessels throughout the brain. It can reveal the presence of narrow arteries, clots blocking the vessels, or malformed blood vessels.  What are the risks? There are no known risks or complications of having an ultrasound. What happens before the procedure?  If the  ultrasound scan involves your upper abdomen, you may be directed not to eat, smoke, or chew gum the morning of your exam. Follow your health care provider's instructions.  During the test, a gel will be applied to your skin. Wear clothing that is easily washable in case  the gel gets on your clothes. What happens during the procedure?  A gel will be applied to your skin. It may feel cool.  The transducer will be placed on the area to be examined.  Pictures will be taken. They will be displayed on one or more monitors that look like small television screens. What happens after the procedure?  You can safely drive home and return to regular activities immediately after your exam.  Keep follow-up visits as directed by your health care provider.  Ask when your test results will be ready. It is your responsibility to get your test results. This information is not intended to replace advice given to you by your health care provider. Make sure you discuss any questions you have with your health care provider. Document Released: 10/16/2004 Document Revised: 03/12/2016 Document Reviewed: 12/28/2013 Elsevier Interactive Patient Education  2018 [employer]. Echocardiogram An echocardiogram, or echocardiography, uses sound waves (ultrasound) to produce an image of your heart. The echocardiogram is simple, painless, obtained within a short period of time, and offers valuable information to your health care provider. The images from an echocardiogram can provide information such as:  Evidence of coronary artery disease (CAD).  Heart size.  Heart muscle function.  Heart valve function.  Aneurysm detection.  Evidence of a past heart attack.  Fluid buildup around the heart.  Heart muscle thickening.  Assess heart valve function.  Tell a health care provider about:  Any allergies you have.  All medicines you are taking, including vitamins, herbs, eye drops, creams, and over-the-counter medicines.  Any problems you or family members have had with anesthetic medicines.  Any blood disorders you have.  Any surgeries you have had.  Any medical conditions you have.  Whether you are pregnant or may be pregnant. What happens before the procedure? No  special preparation is needed. Eat and drink normally. What happens during the procedure?  In order to produce an image of your heart, gel will be applied to your chest and a wand-like tool (transducer) will be moved over your chest. The gel will help transmit the sound waves from the transducer. The sound waves will harmlessly bounce off your heart to allow the heart images to be captured in real-time motion. These images will then be recorded.  You may need an IV to receive a medicine that improves the quality of the pictures. What happens after the procedure? You may return to your normal schedule including diet, activities, and medicines, unless your health care provider tells you otherwise. This information is not intended to replace advice given to you by your health care provider. Make sure you discuss any questions you have with your health care provider. Document Released: 10/02/2000 Document Revised: 05/23/2016 Document Reviewed: 06/12/2013 Elsevier Interactive Patient Education  2017 [employer].

## 2017-11-16 ENCOUNTER — Telehealth: Payer: Self-pay | Admitting: *Deleted

## 2017-11-16 DIAGNOSIS — R1312 Dysphagia, oropharyngeal phase: Secondary | ICD-10-CM | POA: Diagnosis not present

## 2017-11-16 DIAGNOSIS — G629 Polyneuropathy, unspecified: Secondary | ICD-10-CM | POA: Diagnosis not present

## 2017-11-16 DIAGNOSIS — R7303 Prediabetes: Secondary | ICD-10-CM | POA: Diagnosis not present

## 2017-11-16 DIAGNOSIS — Z7984 Long term (current) use of oral hypoglycemic drugs: Secondary | ICD-10-CM | POA: Diagnosis not present

## 2017-11-16 DIAGNOSIS — K219 Gastro-esophageal reflux disease without esophagitis: Secondary | ICD-10-CM | POA: Diagnosis not present

## 2017-11-16 DIAGNOSIS — E785 Hyperlipidemia, unspecified: Secondary | ICD-10-CM | POA: Diagnosis not present

## 2017-11-16 DIAGNOSIS — E43 Unspecified severe protein-calorie malnutrition: Secondary | ICD-10-CM | POA: Diagnosis not present

## 2017-11-16 DIAGNOSIS — J9621 Acute and chronic respiratory failure with hypoxia: Secondary | ICD-10-CM | POA: Diagnosis not present

## 2017-11-16 DIAGNOSIS — Z9119 Patient's noncompliance with other medical treatment and regimen: Secondary | ICD-10-CM | POA: Diagnosis not present

## 2017-11-16 DIAGNOSIS — Z7951 Long term (current) use of inhaled steroids: Secondary | ICD-10-CM | POA: Diagnosis not present

## 2017-11-16 DIAGNOSIS — F325 Major depressive disorder, single episode, in full remission: Secondary | ICD-10-CM | POA: Diagnosis not present

## 2017-11-16 DIAGNOSIS — J449 Chronic obstructive pulmonary disease, unspecified: Secondary | ICD-10-CM | POA: Diagnosis not present

## 2017-11-16 DIAGNOSIS — M810 Age-related osteoporosis without current pathological fracture: Secondary | ICD-10-CM | POA: Diagnosis not present

## 2017-11-16 DIAGNOSIS — D129 Benign neoplasm of anus and anal canal: Secondary | ICD-10-CM | POA: Diagnosis not present

## 2017-11-16 DIAGNOSIS — Z9981 Dependence on supplemental oxygen: Secondary | ICD-10-CM | POA: Diagnosis not present

## 2017-11-16 DIAGNOSIS — M1990 Unspecified osteoarthritis, unspecified site: Secondary | ICD-10-CM | POA: Diagnosis not present

## 2017-11-16 DIAGNOSIS — I10 Essential (primary) hypertension: Secondary | ICD-10-CM | POA: Diagnosis not present

## 2017-11-16 DIAGNOSIS — G20 Parkinson's disease: Secondary | ICD-10-CM | POA: Diagnosis not present

## 2017-11-16 NOTE — Telephone Encounter (Signed)
Verdis Frederickson, from [facility], called from the patient's home. She was evaluating the patient's medications, to determine how and what she is taking. Per Liane Comber, NP, the nurse should compile an accurate list of what the patient is taking, and asked the patient to bring the list to her 11/18/2017 appointment. A verbal order for Home Health visits for medication management given, per Liane Comber, NP.

## 2017-11-17 ENCOUNTER — Telehealth: Payer: Self-pay

## 2017-11-17 NOTE — Progress Notes (Signed)
MEDICARE ANNUAL WELLNESS VISIT AND FOLLOW UP  Assessment:   Diagnoses and all orders for this visit:  Encounter for Medicare annual wellness exam  LVH (left ventricular hypertrophy) Continue close monitoring of BP; follow up with cardiology as recommended.   Hypertensive heart disease without heart failure At goal; continue medications at this time - start keeping BP log and present at next visit - if consistently low, may try to taper off of BP for patient request of reduced pill burden Monitor blood pressure at home; call if consistently over 130/80 Continue DASH diet.   Reminder to go to the ER if any CP, SOB, nausea, dizziness, severe HA, changes vision/speech, left arm numbness and tingling and jaw pain.  Aortic atherosclerosis (HCC) Control blood pressure, cholesterol, glucose, increase exercise.   COPD with chronic bronchitis ([place]) Continue with respiratory medications; annual CXR; pulmonology f/u as indicated  Chronic respiratory failure with hypoxia, on home O2 therapy ([place]) Routinely evaluate need and status, continue home O2 therapy to maintain saturations above 92% Continue respiratory medications, daily prednisone 10 mg   Oropharyngeal dysphagia Continue to emphasize small bites, focus while eating, chew foods well  Reviewed medications today to discuss reducing pill burden  Gastroesophageal reflux disease without esophagitis Well managed on current medications Discussed diet, avoiding triggers and other lifestyle changes  Hereditary and idiopathic peripheral neuropathy Continue follow up with neurology  Parkinson's plus syndrome ([place]) Continue sinemet; follow up with neurology.   Osteoarthritis, unspecified osteoarthritis type, unspecified site Bilateral hands; s/p fusions and joint replacements? - per patient no longer with pain.   Urinary incontinence, unspecified type Wears depends; related to reduced mobility  Mixed hyperlipidemia Currently treated  by pravastatin 40 mg; d/c'd today after discussion of risks and benefits to reduce pill burden Continue low cholesterol diet and exercise.  Check lipid panel routinely.   Unstable Gait Continue with walker; refer back to PT as indicated  Prediabetes Recently well controlled A1Cs Discussed disease and risks Discussed diet/exercise, weight management  Check A1Cs every 6 months  Vitamin D deficiency Continue supplementation for goal of 70-100 Check vitamin D level as needed  At high risk for falls Continue with walker; no falls this past year; refer back to PT as indicated  Depression, major, in remission ([place]) Doing well off of celexa;  xanax PRN for anxiety and O2 hunger Lifestyle discussed: diet/exerise, sleep hygiene, stress management, hydration  Protein-calorie malnutrition, severe Monitor albumin; continue ensure; encourage weight gain  Problem with medical care compliance Continue with home health medication management for poor compliance secondary to lack of knowledge; daughter is present today and will step in to help with this in the future with weekly pill box, monitoring and communication.   Pedal edema Improved; lasix PRN  Over 40 minutes of exam, counseling, chart review and critical decision making was performed Future Appointments  Date Time Provider [place]  12/02/2017  2:15 PM [facility] 1 [facility]  01/26/2018 11:00 AM Liane Comber, NP [facility] None  03/04/2018 11:30 AM Alda Berthold, DO [facility] None  05/12/2018 11:00 AM [name], MD [facility] None     Plan:   During the course of the visit the patient was educated and counseled about appropriate screening and preventive services including:    Pneumococcal vaccine   Prevnar 13  Influenza vaccine  Td vaccine  Screening electrocardiogram  Bone densitometry screening  Colorectal cancer screening  Diabetes screening  Glaucoma screening  Nutrition counseling    Advanced directives: requested  Subjective:  Marie ENYEART is a 82 y.o. Caucasian female with hypertensive heart disease, COPD, parkinson's plus syndrome, oropharyngeal dysphagia, ?chronic respiratory failure with home O2 who presents for Medicare Annual Wellness Visit and 1 week follow up for edema and ongoing concerns for poor medication compliance and frequent recent hospitalizations. Home health was ordered at the last visit for medication management and medications are reconciled today with the patient's husband and daughter present. The daughter has agreed to manage medications and coordinate pill box weekly, as well as communicate any concerns or questions with inconsistencies in the future.   On home O2 therapy - 2L at home as needed to maintain saturations above 88%. Has not needed much recently. Cough is significantly improved; the patient reports she is doing well today without concerns. She was evaluated by cardiology after the last visit for concerns about a ?new murmur that has been attributed to LVH and subvalvular aortic stenosis; in light of recent pedal edema and ongoing dyspea that has been poorly explained.    BMI is Body mass index is 21.61 kg/m., she has not been working on diet and exercise. Wt Readings from Last 3 Encounters:  11/18/17 122 lb (55.3 kg)  11/15/17 122 lb (55.3 kg)  11/11/17 131 lb (59.4 kg)    Medication Review: Current Outpatient Medications on File Prior to Visit  Medication Sig Dispense Refill  . albuterol (PROVENTIL) (2.5 MG/3ML) 0.083% nebulizer solution Take 3 mLs (2.5 mg total) by nebulization every 6 (six) hours as needed. Use 1 ampule in nebulizer 4 times a day or every 4 hours.    . ALPRAZolam (XANAX) 0.25 MG tablet Take 1 tablet (0.25 mg total) by mouth 3 (three) times daily as needed (For anxiety or air hunger). 30 tablet 0  . aspirin 81 MG tablet Take 81 mg by mouth daily. Buys OTC    . budesonide (PULMICORT) 0.5 MG/2ML nebulizer  solution Take 2 mLs (0.5 mg total) by nebulization 2 (two) times daily. Dx-J45.909, J44.0, J20.9 180 mL 1  . carbidopa-levodopa (SINEMET IR) 25-100 MG tablet Take 1.5 tablets by mouth 2 (two) times daily. 270 tablet 3  . Cholecalciferol (VITAMIN D) 2000 units CAPS Take 1 capsule by mouth daily.    Marland Kitchen ENSURE (ENSURE) Take 237 mLs by mouth daily.    . famotidine (PEPCID) 20 MG tablet Take 20 mg by mouth 2 (two) times daily.    . formoterol (PERFOROMIST) 20 MCG/2ML nebulizer solution Take 2 mLs (20 mcg total) by nebulization 2 (two) times daily. 360 mL 2  . furosemide (LASIX) 40 MG tablet Take 1 tablet daily for fluid retention or an=kle swelling 90 tablet 1  . olmesartan (BENICAR) 20 MG tablet Take 1 tablet daily for BP (replaces Losartan) 90 tablet 1  . omeprazole (PRILOSEC) 40 MG capsule Take 1 capsule daily for Acid Reflux 90 capsule 1  . pravastatin (PRAVACHOL) 40 MG tablet Take 40 mg by mouth daily.     . predniSONE (DELTASONE) 20 MG tablet 2 tablets daily for 3 days, 1 tablet daily for 4 days. 10 tablet 0  . PROCTOZONE-HC 2.5 % rectal cream Apply 1 application topically 2 (two) times daily.    . sodium chloride HYPERTONIC 3 % nebulizer solution Take 1 vial by nebulization BID 240 mL 11   Current Facility-Administered Medications on File Prior to Visit  Medication Dose Route Frequency Provider Last Rate Last Dose  . ipratropium-albuterol (DUONEB) 0.5-2.5 (3) MG/3ML nebulizer solution 3 mL  3 mL Nebulization Once  Liane Comber, NP        Allergies  Allergen Reactions  . Ertapenem Hives    Caused whole body to turn red Other reaction(s): Redness Caused whole body to turn red  . Codeine Other (See Comments)    hallucinations  . Demerol Other (See Comments)    hallucinations  . Morphine And Related Other (See Comments)    hallucinations  . Other Other (See Comments)    Invan 7- pt became red all over  . Sulfate Other (See Comments)    unknown    Current Problems  (verified) Patient Active Problem List   Diagnosis Date Noted  . Subvalvular aortic stenosis determined by imaging 11/15/2017  . Pedal edema 11/15/2017  . Problem with medical care compliance 11/11/2017  . Aortic atherosclerosis ([place]) 10/19/2017  . Oropharyngeal dysphagia 09/28/2017  . CAP (community acquired pneumonia) 09/20/2017  . Chronic respiratory failure with hypoxia, on home O2 therapy ([place]) 09/20/2017  . COPD with chronic bronchitis ([place]) 07/20/2017  . Hypertensive heart disease 03/29/2017  . Protein-calorie malnutrition, severe 03/17/2017  . Parkinson's plus syndrome ([place]) 03/04/2017  . Restrictive lung disease 02/24/2017  . LVH (left ventricular hypertrophy) 01/07/2017  . Depression, major, in remission ([place]) 09/22/2016  . At high risk for falls 10/16/2015  . Prediabetes 08/23/2014  . Vitamin D deficiency 08/23/2014  . Medication management 08/23/2014  . Unstable Gait 01/30/2014  . Incontinence   . Osteoarthritis   . Hyperlipidemia   . GERD (gastroesophageal reflux disease)   . Hereditary and idiopathic peripheral neuropathy   . Tubulovillous adenoma of rectum 09/04/2011    Screening Tests Immunization History  Administered Date(s) Administered  . Influenza, High Dose Seasonal PF 08/23/2014, 07/24/2015, 08/13/2016, 07/12/2017  . Pneumococcal Conjugate-13 08/23/2014  . Pneumococcal-Unspecified 07/20/2003, 08/17/2013  . Td 08/17/2013  . Tdap 10/25/2012  . Zoster 03/19/2015   Preventative care: Last colonoscopy: 2012 Last mammogram: 04/2017 Last pap smear/pelvic exam: remote   DEXA: 2016 - osteopenia T-2.2 at last - Declines further  Prior vaccinations: TD or Tdap: 2014  Influenza: 2018 Pneumococcal: 2014 Prevnar13: 2015 Shingles/Zostavax: 2016  Names of Other Physician/Practitioners you currently use: 1. [facility] here for primary care 2. Eulas Post, eye doctor, last visit 2017 3. Harrington Challenger, dentist, last visit Nov  2018  Patient Care Team: [name], MD as PCP - General (Internal Medicine) Dorna Leitz, MD as Consulting Physician (Orthopedic Surgery) Teena Irani, MD (Inactive) as Consulting Physician (Gastroenterology) Garrel Ridgel, [place] as Consulting Physician (Podiatry) Juanito Doom, MD as Consulting Physician (Pulmonary Disease) Alda Berthold, DO as Consulting Physician (Neurology)  SURGICAL HISTORY She  has a past surgical history that includes Incontinence surgery; Rectal surgery; Appendectomy; Abdominal hysterectomy; Tonsillectomy; Eye surgery; Dilation and curettage of uterus; Rectal biopsy (09/21/2011); Finger surgery; External fixation leg (10/25/2012); Knee arthroscopy with medial menisectomy (Right, 09/19/2014); Knee arthroscopy with lateral menisectomy (Right, 09/19/2014); and Chondroplasty (09/19/2014). FAMILY HISTORY Her family history includes COPD in her mother; Cancer in her sister; Heart attack in her father; High blood pressure in her mother. SOCIAL HISTORY She  reports that  has never smoked. she has never used smokeless tobacco. She reports that she does not drink alcohol or use drugs.   MEDICARE WELLNESS OBJECTIVES: Physical activity: Current Exercise Habits: The patient does not participate in regular exercise at present, Exercise limited by: neurologic condition(s);respiratory conditions(s) Cardiac risk factors: Cardiac Risk Factors include: advanced age (>65men, >1 women);dyslipidemia;hypertension;sedentary lifestyle Depression/mood screen:   Depression screen [facility] 2/9  11/18/2017  Decreased Interest 0  Down, Depressed, Hopeless 0  PHQ - 2 Score 0  Some recent data might be hidden    ADLs:  In your present state of health, do you have any difficulty performing the following activities: 11/18/2017 10/28/2017  Hearing? Y N  Comment Wears hearing aids -  Vision? N N  Difficulty concentrating or making decisions? N N  Walking or climbing stairs? N N  Comment - -   Dressing or bathing? N N  Comment Husband helps as needed -  Doing errands, shopping? Y N  Comment Is driven by family memebers -  Conservation officer, nature and eating ? N -  Using the Toilet? N -  In the past six months, have you accidently leaked urine? Y -  Comment Wear depends;  -  Do you have problems with loss of bowel control? N -  Managing your Medications? Y -  Managing your Finances? N -  Housekeeping or managing your Housekeeping? Y -  Comment Has hired help -  Some recent data might be hidden     Cognitive Testing  Alert? Yes  Normal Appearance?Yes  Oriented to person? Yes  Place? Yes   Time? Yes  Recall of three objects?  Yes  Can perform simple calculations? Yes  Displays appropriate judgment? Questionable  Can read the correct time from a watch face?Yes  EOL planning: Does Patient Have a Medical Advance Directive?: Yes Type of Advance Directive: Healthcare Power of Attorney, Living will Does patient want to make changes to medical advance directive?: No - Patient declined Copy of [place] in Chart?: No - copy requested Would patient like information on creating a medical advance directive?: No - Patient declined  Review of Systems  Constitutional: Negative for malaise/fatigue and weight loss.  HENT: Negative for congestion, hearing loss, sore throat and tinnitus.   Eyes: Negative for blurred vision and double vision.  Respiratory: Negative for cough, shortness of breath and wheezing.   Cardiovascular: Negative for chest pain, palpitations, orthopnea, claudication and leg swelling.  Gastrointestinal: Negative for abdominal pain, blood in stool, constipation, diarrhea, heartburn, melena, nausea and vomiting.  Genitourinary: Negative.   Musculoskeletal: Negative for joint pain and myalgias.  Skin: Negative for rash.  Neurological: Negative for dizziness, tingling, sensory change, weakness and headaches.  Endo/Heme/Allergies: Negative for polydipsia.   Psychiatric/Behavioral: Negative.  Negative for depression and memory loss. The patient is not nervous/anxious and does not have insomnia.   All other systems reviewed and are negative.    Objective:     Today's Vitals   11/18/17 1402  BP: 108/60  Pulse: 82  Temp: 97.9 F (36.6 C)  SpO2: 97%  Weight: 122 lb (55.3 kg)  Height: 5\' 3"  (1.6 m)   Body mass index is 21.61 kg/m.  General appearance: alert, no distress, WD/WN, frail female HEENT: normocephalic, sclerae anicteric, TMs pearly, nares patent, no discharge or erythema, pharynx normal Oral cavity: MMM, no lesions Neck: supple, no lymphadenopathy, no thyromegaly, no masses Heart: RRR, normal S1, S2, no murmurs Lungs: CTA bilaterally, no wheezes, rhonchi, or rales Abdomen: +bs, soft, non tender, non distended, no masses, no hepatomegaly, no splenomegaly Musculoskeletal: nontender, no swelling, no obvious deformity Extremities: Scant edema to R foot, no cyanosis, no clubbing Pulses: 2+ symmetric, upper and lower extremities, normal cap refill Neurological: alert, oriented x 3, CN2-12 intact, strength symmetrical upper extremities and lower extremities, sensation normal throughout,  gait slow but steady with walker, sitting to standing is  easily accomplished Psychiatric: normal affect, behavior normal, pleasant   Medicare Attestation I have personally reviewed: The patient's medical and social history Their use of alcohol, tobacco or illicit drugs Their current medications and supplements The patient's functional ability including ADLs,fall risks, home safety risks, cognitive, and hearing and visual impairment Diet and physical activities Evidence for depression or mood disorders  The patient's weight, height, BMI, and visual acuity have been recorded in the chart.  I have made referrals, counseling, and provided education to the patient based on review of the above and I have provided the patient with a written  personalized care plan for preventive services.     Izora Ribas, NP   11/18/2017

## 2017-11-17 NOTE — Telephone Encounter (Signed)
According to the pharmacy, the Rx was cancelled and would not be covered under the current diagnosis. Needs a different Dx in order for this to covered. Current ICD10 is J96.21.

## 2017-11-18 ENCOUNTER — Ambulatory Visit (INDEPENDENT_AMBULATORY_CARE_PROVIDER_SITE_OTHER): Payer: Medicare Other | Admitting: Adult Health

## 2017-11-18 ENCOUNTER — Encounter: Payer: Self-pay | Admitting: Adult Health

## 2017-11-18 VITALS — BP 108/60 | HR 82 | Temp 97.9°F | Ht 63.0 in | Wt 122.0 lb

## 2017-11-18 DIAGNOSIS — I119 Hypertensive heart disease without heart failure: Secondary | ICD-10-CM

## 2017-11-18 DIAGNOSIS — I517 Cardiomegaly: Secondary | ICD-10-CM | POA: Diagnosis not present

## 2017-11-18 DIAGNOSIS — G609 Hereditary and idiopathic neuropathy, unspecified: Secondary | ICD-10-CM | POA: Diagnosis not present

## 2017-11-18 DIAGNOSIS — I700 Atherosclerosis of aorta: Secondary | ICD-10-CM

## 2017-11-18 DIAGNOSIS — J4489 Other specified chronic obstructive pulmonary disease: Secondary | ICD-10-CM

## 2017-11-18 DIAGNOSIS — J449 Chronic obstructive pulmonary disease, unspecified: Secondary | ICD-10-CM

## 2017-11-18 DIAGNOSIS — I10 Essential (primary) hypertension: Secondary | ICD-10-CM

## 2017-11-18 DIAGNOSIS — E43 Unspecified severe protein-calorie malnutrition: Secondary | ICD-10-CM

## 2017-11-18 DIAGNOSIS — R0989 Other specified symptoms and signs involving the circulatory and respiratory systems: Secondary | ICD-10-CM

## 2017-11-18 DIAGNOSIS — R600 Localized edema: Secondary | ICD-10-CM

## 2017-11-18 DIAGNOSIS — E559 Vitamin D deficiency, unspecified: Secondary | ICD-10-CM

## 2017-11-18 DIAGNOSIS — G232 Striatonigral degeneration: Secondary | ICD-10-CM | POA: Diagnosis not present

## 2017-11-18 DIAGNOSIS — R269 Unspecified abnormalities of gait and mobility: Secondary | ICD-10-CM

## 2017-11-18 DIAGNOSIS — Z9181 History of falling: Secondary | ICD-10-CM

## 2017-11-18 DIAGNOSIS — Z9981 Dependence on supplemental oxygen: Secondary | ICD-10-CM

## 2017-11-18 DIAGNOSIS — K219 Gastro-esophageal reflux disease without esophagitis: Secondary | ICD-10-CM

## 2017-11-18 DIAGNOSIS — Z0001 Encounter for general adult medical examination with abnormal findings: Secondary | ICD-10-CM | POA: Diagnosis not present

## 2017-11-18 DIAGNOSIS — M1990 Unspecified osteoarthritis, unspecified site: Secondary | ICD-10-CM

## 2017-11-18 DIAGNOSIS — J9611 Chronic respiratory failure with hypoxia: Secondary | ICD-10-CM

## 2017-11-18 DIAGNOSIS — R011 Cardiac murmur, unspecified: Secondary | ICD-10-CM

## 2017-11-18 DIAGNOSIS — G20C Parkinsonism, unspecified: Secondary | ICD-10-CM

## 2017-11-18 DIAGNOSIS — Z91199 Patient's noncompliance with other medical treatment and regimen due to unspecified reason: Secondary | ICD-10-CM

## 2017-11-18 DIAGNOSIS — Z79899 Other long term (current) drug therapy: Secondary | ICD-10-CM

## 2017-11-18 DIAGNOSIS — R7303 Prediabetes: Secondary | ICD-10-CM

## 2017-11-18 DIAGNOSIS — F325 Major depressive disorder, single episode, in full remission: Secondary | ICD-10-CM

## 2017-11-18 DIAGNOSIS — R1312 Dysphagia, oropharyngeal phase: Secondary | ICD-10-CM | POA: Diagnosis not present

## 2017-11-18 DIAGNOSIS — R32 Unspecified urinary incontinence: Secondary | ICD-10-CM | POA: Diagnosis not present

## 2017-11-18 DIAGNOSIS — R6889 Other general symptoms and signs: Secondary | ICD-10-CM | POA: Diagnosis not present

## 2017-11-18 DIAGNOSIS — Z9119 Patient's noncompliance with other medical treatment and regimen: Secondary | ICD-10-CM

## 2017-11-18 DIAGNOSIS — E782 Mixed hyperlipidemia: Secondary | ICD-10-CM

## 2017-11-18 DIAGNOSIS — Z0000 Encounter for general adult medical examination without abnormal findings: Secondary | ICD-10-CM

## 2017-11-18 MED ORDER — PREDNISONE 10 MG PO TABS: ORAL_TABLET | ORAL | 1 refills | Status: DC

## 2017-11-18 NOTE — Patient Instructions (Signed)
Follow up with hearing aid office about fitment.

## 2017-11-19 DIAGNOSIS — G20 Parkinson's disease: Secondary | ICD-10-CM | POA: Diagnosis not present

## 2017-11-19 DIAGNOSIS — J9621 Acute and chronic respiratory failure with hypoxia: Secondary | ICD-10-CM | POA: Diagnosis not present

## 2017-11-19 DIAGNOSIS — J449 Chronic obstructive pulmonary disease, unspecified: Secondary | ICD-10-CM | POA: Diagnosis not present

## 2017-11-19 DIAGNOSIS — I10 Essential (primary) hypertension: Secondary | ICD-10-CM | POA: Diagnosis not present

## 2017-11-19 DIAGNOSIS — G629 Polyneuropathy, unspecified: Secondary | ICD-10-CM | POA: Diagnosis not present

## 2017-11-19 DIAGNOSIS — D129 Benign neoplasm of anus and anal canal: Secondary | ICD-10-CM | POA: Diagnosis not present

## 2017-11-23 DIAGNOSIS — D129 Benign neoplasm of anus and anal canal: Secondary | ICD-10-CM | POA: Diagnosis not present

## 2017-11-23 DIAGNOSIS — J449 Chronic obstructive pulmonary disease, unspecified: Secondary | ICD-10-CM | POA: Diagnosis not present

## 2017-11-23 DIAGNOSIS — G629 Polyneuropathy, unspecified: Secondary | ICD-10-CM | POA: Diagnosis not present

## 2017-11-23 DIAGNOSIS — I10 Essential (primary) hypertension: Secondary | ICD-10-CM | POA: Diagnosis not present

## 2017-11-23 DIAGNOSIS — J9621 Acute and chronic respiratory failure with hypoxia: Secondary | ICD-10-CM | POA: Diagnosis not present

## 2017-11-23 DIAGNOSIS — G20 Parkinson's disease: Secondary | ICD-10-CM | POA: Diagnosis not present

## 2017-11-24 ENCOUNTER — Telehealth: Payer: Self-pay | Admitting: Internal Medicine

## 2017-11-24 NOTE — Telephone Encounter (Signed)
Advised patient that Performist was shipped by [facility] 11-23-17. Use via nebulizer 2 times daily per [facility] as needed for SOB. Discontinue the Advair. Patient expressed understanding of directions. Faxed [facility] new medicine instructions & D/C notification on Advair.

## 2017-11-25 DIAGNOSIS — J9621 Acute and chronic respiratory failure with hypoxia: Secondary | ICD-10-CM | POA: Diagnosis not present

## 2017-11-25 DIAGNOSIS — I10 Essential (primary) hypertension: Secondary | ICD-10-CM | POA: Diagnosis not present

## 2017-11-25 DIAGNOSIS — J449 Chronic obstructive pulmonary disease, unspecified: Secondary | ICD-10-CM | POA: Diagnosis not present

## 2017-11-25 DIAGNOSIS — G20 Parkinson's disease: Secondary | ICD-10-CM | POA: Diagnosis not present

## 2017-11-25 DIAGNOSIS — G629 Polyneuropathy, unspecified: Secondary | ICD-10-CM | POA: Diagnosis not present

## 2017-11-25 DIAGNOSIS — D129 Benign neoplasm of anus and anal canal: Secondary | ICD-10-CM | POA: Diagnosis not present

## 2017-12-02 ENCOUNTER — Ambulatory Visit (HOSPITAL_BASED_OUTPATIENT_CLINIC_OR_DEPARTMENT_OTHER): Payer: Medicare Other

## 2017-12-03 ENCOUNTER — Telehealth: Payer: Self-pay | Admitting: *Deleted

## 2017-12-03 ENCOUNTER — Other Ambulatory Visit: Payer: Self-pay | Admitting: Internal Medicine

## 2017-12-03 ENCOUNTER — Other Ambulatory Visit: Payer: Self-pay | Admitting: *Deleted

## 2017-12-03 MED ORDER — AZITHROMYCIN 250 MG PO TABS: ORAL_TABLET | ORAL | 0 refills | Status: AC

## 2017-12-03 MED ORDER — ALBUTEROL SULFATE (2.5 MG/3ML) 0.083% IN NEBU: 2.5000 mg | INHALATION_SOLUTION | Freq: Four times a day (QID) | RESPIRATORY_TRACT | 3 refills | Status: DC | PRN

## 2017-12-03 MED ORDER — ALBUTEROL SULFATE (2.5 MG/3ML) 0.083% IN NEBU: INHALATION_SOLUTION | RESPIRATORY_TRACT | 3 refills | Status: DC

## 2017-12-03 NOTE — Telephone Encounter (Signed)
Spouse called and reported the patient is having a cough with yellow mucus, wheezing and shortness of breath.  Per Dr Melford Aase, the patient was advised to take her breathing treatments every 4 hours and an RX for a Z-pak was sent in to her pharmacy.  The spouse is aware.

## 2017-12-09 ENCOUNTER — Ambulatory Visit (INDEPENDENT_AMBULATORY_CARE_PROVIDER_SITE_OTHER): Payer: Medicare Other | Admitting: Internal Medicine

## 2017-12-09 VITALS — BP 152/80 | HR 92 | Temp 97.3°F | Resp 20 | Ht 63.0 in | Wt 125.6 lb

## 2017-12-09 DIAGNOSIS — J9611 Chronic respiratory failure with hypoxia: Secondary | ICD-10-CM

## 2017-12-09 DIAGNOSIS — Z9981 Dependence on supplemental oxygen: Secondary | ICD-10-CM

## 2017-12-09 DIAGNOSIS — I10 Essential (primary) hypertension: Secondary | ICD-10-CM

## 2017-12-09 DIAGNOSIS — F325 Major depressive disorder, single episode, in full remission: Secondary | ICD-10-CM | POA: Diagnosis not present

## 2017-12-09 MED ORDER — FUROSEMIDE 40 MG PO TABS: ORAL_TABLET | ORAL | 1 refills | Status: DC

## 2017-12-09 MED ORDER — ALPRAZOLAM 0.5 MG PO TABS: ORAL_TABLET | ORAL | 5 refills | Status: DC

## 2017-12-09 NOTE — Progress Notes (Signed)
Subjective:    Patient ID: Marie [name], female    DOB: 10-15-1936, 82 y.o.   MRN: [id number]  HPI    This nice 82 yo MWF with HTN, HLD, Pre-DM, Restrictive Lung Dz and Parkinson's (+) with mild Dementia and limited insight has has numerous ER visits for acute dyspnea compounding her moderately severe Restrictive Lung Disease when she panics and presents to the ER's. It has been long suspected that there is a large component of poor compliance in medication regimen of her inhalent bronchodilator therapy and husband likewise seems oblivious to her med dosing schedules and of little her in assessing her compliance. Also, she's had more ankle swelling.  Medication Sig  . albuterol (PROVENTIL) (2.5 MG/3ML) 0.083% nebulizer solution Use 1 ampule in nebulizer 4 times a day or every 4 hours as needed to rescue asthma.  Marland Kitchen aspirin 81 MG tablet Take 81 mg by mouth daily. Buys OTC  . budesonide (PULMICORT) 0.5 MG/2ML nebulizer solution Take 2 mLs (0.5 mg total) by nebulization 2 (two) times daily. Dx-J45.909, J44.0, J20.9  . carbidopa-levodopa (SINEMET IR) 25-100 MG tablet Take 1.5 tablets by mouth 2 (two) times daily.  . Cholecalciferol (VITAMIN D) 2000 units CAPS Take 1 capsule by mouth daily.  Marland Kitchen ENSURE (ENSURE) Take 237 mLs by mouth daily.  . famotidine (PEPCID) 20 MG tablet Take 20 mg by mouth 2 (two) times daily as needed.  . formoterol (PERFOROMIST) 20 MCG/2ML nebulizer solution Take 2 mLs (20 mcg total) by nebulization 2 (two) times daily.  Marland Kitchen olmesartan (BENICAR) 20 MG tablet Take 1 tablet daily for BP (replaces Losartan)  . omeprazole (PRILOSEC) 40 MG capsule Take 1 capsule daily for Acid Reflux  . PROCTOZONE-HC 2.5 % rectal cream Apply 1 application topically as needed.  . sodium chloride HYPERTONIC 3 % nebulizer solution Take 1 vial by nebulization BID  . ALPRAZolam (XANAX) 0.25 MG tablet Take 1 tablet (0.25 mg total) by mouth 3 (three) times daily as needed (For anxiety or air hunger).  .  furosemide (LASIX) 40 MG tablet Take 1 tablet daily for fluid retention or an=kle swelling   Allergies  Allergen Reactions  . Ertapenem Hives    Caused whole body to turn red Other reaction(s): Redness Caused whole body to turn red  . Codeine Other (See Comments)    hallucinations  . Demerol Other (See Comments)    hallucinations  . Morphine And Related Other (See Comments)    hallucinations  . Other Other (See Comments)    Invan 7- pt became red all over  . Sulfate Other (See Comments)    unknown   Past Medical History:  Diagnosis Date  . Balance disorder 2008.     Falls a lot.  She can be standing and then leans too far over to one side   . Bulging disc   . Chronic bronchitis ([place])    due to congestion at times, on prednisone and advair  . COPD (chronic obstructive pulmonary disease) ([place])   . Depression    many years ago  . GERD (gastroesophageal reflux disease)   . Hearing loss    mild  . Hyperlipidemia   . Hypertension   . Iatrogenic adrenal insufficiency ([place])   . Incontinence    not indicated at this visit.  Marland Kitchen LVH (left ventricular hypertrophy) due to hypertensive disease    a. 02/2017: echo showing an EF of 65-70% with moderate LVH, hyperdynamic LV with subvalvular gradient of 70 mmHg, SAM, and mild  MR  . Neuropathy    legs stay numb   . Osteoarthritis    hands,   . Osteoporosis   . Shortness of breath dyspnea   . Tubulovillous adenoma of rectum   . Wears dentures   . Wears glasses    Review of Systems  10 point systems review negative except as above.    Objective:   Physical Exam  BP (!) 152/80   Pulse 92   Temp (!) 97.3 F (36.3 C)   Resp 20   Ht 5\' 3"  (1.6 m)   Wt 125 lb 9.6 oz (57 kg)   SpO2 97%  On 2 lit Nasal O2   BMI 22.25 kg/m   In No Distress Dry cough.   HEENT - Eac's patent. TM's Nl. EOM's full. PERRLA. NasoOroPharynx clear. Neck - supple. Nl Thyroid. Carotids 2+ & No bruits, nodes, JVD Chest - Clear equal BS w/ few scattered  rales & rhonchi and no  wheezes. Cor - Nl HS. RRR w/ Gr 3 sys m. PP 1(+). 1(+) pretibial and 1-2 (+) ankle edema. Abd - No palpable organomegaly, masses or tenderness. BS nl. MS- FROM w/o deformities. Muscle power, tone and bulk decreased.  Gait broad-based supported w/ a rollator.  Neuro - No obvious Cr N abnormalities. Sensory, motor and Cerebellar functions appear Nl w/o focal abnormalities. Poor insight and comprehension.  Psyche - Mental status - somewhat anxious.  No delusions, ideations or obvious mood abnormalities. Skin - exposed clear w/o rash cyanosis or icterus.    Assessment & Plan:   1. Chronic respiratory failure with hypoxia, on home O2 therapy ([place])  2. Essential hypertension  - furosemide (LASIX) 40 MG tablet; Take 1 tablet daily for fluid retention or ankle swelling  Dispense: 90 tablet; Refill: 1  3. Depression, major, in remission ([place])  - to hopefully help with chronic and acute anxiety and panic episodes will try increase dose of Alprazolam  - ALPRAZolam (XANAX) 0.5 MG tablet; Take 1 tablet 3 x / day for Anxiety & Air Hunger  Dispense: 90 tablet; Refill: 5  - discussed meds / SE's.

## 2017-12-09 NOTE — Patient Instructions (Addendum)
Chronic Respiratory Failure  Respiratory failure is a condition in which the lungs do not work well and the breathing (respiratory) system fails. When respiratory failure occurs, it becomes difficult for the lungs to get enough oxygen or to eliminate carbon dioxide or to do both duties. If the lungs do not work properly, the heart, brain, and other body systems do not get enough oxygen. Respiratory failure is life-threatening if it is not treated. Respiratory failure can be acute or chronic. Acute respiratory failure is sudden and severe and requires emergency medical treatment. Chronic respiratory failure happens over time, usually due to a medical condition that gets worse.   What are the causes?  This condition may be caused by any problem that affects the heart or lungs. Causes include:  Chronic bronchitis and emphysema (COPD).  Pulmonary fibrosis.  Water in the lungs due to heart failure, lung injury, or infection (pulmonary edema).  Asthma.  Nerve or muscle diseases that make chest movements difficult, such as Leta Baptist disease or Guillain-Barre syndrome.  A collapsed lung (pneumothorax).  Pulmonary hypertension.  Chronic sleep apnea.  Pneumonia.  Obesity.  A blood clot in a lung (pulmonary embolism).  Trauma to the chest that makes breathing difficult.  What increases the risk? You are more likely to develop this condition if:  You are a smoker, or have a history of smoking.  You have a weak immune system.  You have a family history of breathing problems or lung disease.  You have a long term lung disease such as COPD.  What are the signs or symptoms? Symptoms of this condition include:  Shortness of breath with or without activity.  Difficulty breathing.  Wheezing.  A fast or irregular heartbeat (arrhythmia).  Chest pain or tightness.  A bluish color to the fingernail or toenail beds (cyanosis).  Confusion.  Drowsiness.  Extreme fatigue,  especially with minimal activity.  How is this diagnosed?  This condition may be diagnosed based on:  Your medical history.  A physical exam.   How is this treated?  Treatment for this condition depends on the cause. Treatment can include the following:  Getting oxygen through a nasal cannula. This is a tube that goes in your nose.  Getting oxygen through a face mask.  Receiving noninvasive positive pressure ventilation. This is a method of breathing support in which a machine blows air into your lungs through a mask. The machine allows you to breathe on your own. It helps the body take in oxygen and eliminate carbon dioxide.  Using a ventilator. This is a breathing machine that delivers oxygen to the lungs through a breathing tube that is put into the trachea. This machine is used when you can no longer breathe well enough on your own.  Medicines to help with breathing, such as: ? Medicines that open up and relax air passages, such as bronchodilators. These may be given through a device that turns liquid medicines into a mist you can breathe in (nebulizer). These medicines help with breathing. ? Diuretics. These medicines get rid of extra fluid out of your lungs, which can help you breathe better. ? Steroid medicines. These decrease inflammation in the lungs. ? Antibiotic medicines. These may be given to treat a bacterial infection, such as pneumonia.  Pulmonary rehabilitation. This is an exercise program that strengthens the muscles in your chest and helps you learn breathing techniques in order to manage your condition.  Follow these instructions at home: Medicines  Take over-the-counter and prescription  medicines only as told by your health care provider.  If you were prescribed an antibiotic medicine, take it as told by your health care provider. Do not stop taking the antibiotic even if you start to feel better. General instructions  Use oxygen therapy and pulmonary  rehabilitation if directed to by your health care provider. If you require home oxygen therapy, ask your health care provider whether you should purchase a pulse oximeter to measure your oxygen level at home.  Work with your health care provider to create a plan to help you deal with your condition. Follow this plan.  Do not use any products that contain nicotine or tobacco, such as cigarettes and e-cigarettes. If you need help quitting, ask your health care provider.  Avoid exposure to irritants that make your breathing problems worse. These include smoke, chemicals, and fumes.  Stay active, but balance activity with periods of rest. Exercise and physical activity will help you maintain your ability to do things you want to do.  Stay up to date on all vaccines, especially yearly influenza and pneumonia vaccines.  Avoid people who are sick as well as crowded places during the flu season.  Keep all follow-up visits as told by your health care provider. This is important. Contact a health care provider if:  Your shortness of breath gets worse and you cannot do the things you used to do.  You have increased mucus (sputum), wheezing, coughing, or loss of energy.  You are on oxygen therapy and you are starting to need more.  You need to use your medicines more often.  You have a fever. Get help right away if:  Your shortness of breath becomes worse.  You are unable to say more than a few words without having to catch your breath.  You develop chest pain or tightness. Summary  Respiratory failure is a condition in which the lungs do not work well and the breathing system fails.  This condition can be very serious and is often life-threatening.

## 2017-12-12 ENCOUNTER — Encounter: Payer: Self-pay | Admitting: Internal Medicine

## 2017-12-15 ENCOUNTER — Emergency Department (HOSPITAL_COMMUNITY): Payer: Medicare Other

## 2017-12-15 ENCOUNTER — Observation Stay (HOSPITAL_COMMUNITY)
Admission: EM | Admit: 2017-12-15 | Discharge: 2017-12-16 | Disposition: A | Discharge status: Home / Self Care | Payer: Medicare Other | Attending: Internal Medicine | Admitting: Internal Medicine

## 2017-12-15 ENCOUNTER — Encounter (HOSPITAL_COMMUNITY): Payer: Self-pay | Admitting: Emergency Medicine

## 2017-12-15 ENCOUNTER — Other Ambulatory Visit: Payer: Self-pay

## 2017-12-15 DIAGNOSIS — Z882 Allergy status to sulfonamides status: Secondary | ICD-10-CM | POA: Insufficient documentation

## 2017-12-15 DIAGNOSIS — I110 Hypertensive heart disease with heart failure: Secondary | ICD-10-CM | POA: Diagnosis not present

## 2017-12-15 DIAGNOSIS — Z885 Allergy status to narcotic agent status: Secondary | ICD-10-CM | POA: Diagnosis not present

## 2017-12-15 DIAGNOSIS — E43 Unspecified severe protein-calorie malnutrition: Secondary | ICD-10-CM | POA: Diagnosis not present

## 2017-12-15 DIAGNOSIS — F419 Anxiety disorder, unspecified: Secondary | ICD-10-CM | POA: Diagnosis not present

## 2017-12-15 DIAGNOSIS — I700 Atherosclerosis of aorta: Secondary | ICD-10-CM | POA: Insufficient documentation

## 2017-12-15 DIAGNOSIS — G20 Parkinson's disease: Secondary | ICD-10-CM | POA: Insufficient documentation

## 2017-12-15 DIAGNOSIS — G20C Parkinsonism, unspecified: Secondary | ICD-10-CM | POA: Diagnosis present

## 2017-12-15 DIAGNOSIS — I5032 Chronic diastolic (congestive) heart failure: Secondary | ICD-10-CM | POA: Diagnosis present

## 2017-12-15 DIAGNOSIS — H9190 Unspecified hearing loss, unspecified ear: Secondary | ICD-10-CM | POA: Insufficient documentation

## 2017-12-15 DIAGNOSIS — J441 Chronic obstructive pulmonary disease with (acute) exacerbation: Principal | ICD-10-CM | POA: Insufficient documentation

## 2017-12-15 DIAGNOSIS — Z8249 Family history of ischemic heart disease and other diseases of the circulatory system: Secondary | ICD-10-CM | POA: Insufficient documentation

## 2017-12-15 DIAGNOSIS — R1312 Dysphagia, oropharyngeal phase: Secondary | ICD-10-CM | POA: Insufficient documentation

## 2017-12-15 DIAGNOSIS — R0602 Shortness of breath: Secondary | ICD-10-CM | POA: Diagnosis not present

## 2017-12-15 DIAGNOSIS — G232 Striatonigral degeneration: Secondary | ICD-10-CM | POA: Diagnosis present

## 2017-12-15 DIAGNOSIS — Z888 Allergy status to other drugs, medicaments and biological substances status: Secondary | ICD-10-CM | POA: Insufficient documentation

## 2017-12-15 DIAGNOSIS — J9601 Acute respiratory failure with hypoxia: Secondary | ICD-10-CM | POA: Diagnosis present

## 2017-12-15 DIAGNOSIS — Z7982 Long term (current) use of aspirin: Secondary | ICD-10-CM | POA: Insufficient documentation

## 2017-12-15 DIAGNOSIS — Z86010 Personal history of colonic polyps: Secondary | ICD-10-CM | POA: Diagnosis not present

## 2017-12-15 DIAGNOSIS — G5793 Unspecified mononeuropathy of bilateral lower limbs: Secondary | ICD-10-CM | POA: Diagnosis not present

## 2017-12-15 DIAGNOSIS — Z9981 Dependence on supplemental oxygen: Secondary | ICD-10-CM | POA: Insufficient documentation

## 2017-12-15 DIAGNOSIS — J9621 Acute and chronic respiratory failure with hypoxia: Secondary | ICD-10-CM | POA: Diagnosis not present

## 2017-12-15 DIAGNOSIS — R0682 Tachypnea, not elsewhere classified: Secondary | ICD-10-CM | POA: Diagnosis not present

## 2017-12-15 DIAGNOSIS — M810 Age-related osteoporosis without current pathological fracture: Secondary | ICD-10-CM | POA: Diagnosis not present

## 2017-12-15 DIAGNOSIS — M19041 Primary osteoarthritis, right hand: Secondary | ICD-10-CM | POA: Diagnosis not present

## 2017-12-15 DIAGNOSIS — M19042 Primary osteoarthritis, left hand: Secondary | ICD-10-CM | POA: Insufficient documentation

## 2017-12-15 DIAGNOSIS — R296 Repeated falls: Secondary | ICD-10-CM | POA: Insufficient documentation

## 2017-12-15 DIAGNOSIS — K219 Gastro-esophageal reflux disease without esophagitis: Secondary | ICD-10-CM | POA: Insufficient documentation

## 2017-12-15 DIAGNOSIS — L8990 Pressure ulcer of unspecified site, unspecified stage: Secondary | ICD-10-CM

## 2017-12-15 DIAGNOSIS — E785 Hyperlipidemia, unspecified: Secondary | ICD-10-CM | POA: Insufficient documentation

## 2017-12-15 DIAGNOSIS — Z6820 Body mass index (BMI) 20.0-20.9, adult: Secondary | ICD-10-CM | POA: Insufficient documentation

## 2017-12-15 DIAGNOSIS — Z79899 Other long term (current) drug therapy: Secondary | ICD-10-CM | POA: Diagnosis not present

## 2017-12-15 DIAGNOSIS — I10 Essential (primary) hypertension: Secondary | ICD-10-CM | POA: Diagnosis not present

## 2017-12-15 HISTORY — DX: Chronic obstructive pulmonary disease with (acute) exacerbation: J44.1

## 2017-12-15 LAB — I-STAT TROPONIN, ED: TROPONIN I, POC: 0 ng/mL (ref 0.00–0.08)

## 2017-12-15 LAB — CBC WITH DIFFERENTIAL/PLATELET
BASOS PCT: 1 %
Basophils Absolute: 0.1 10*3/uL (ref 0.0–0.1)
EOS ABS: 0.9 10*3/uL — AB (ref 0.0–0.7)
Eosinophils Relative: 10 %
HCT: 46.7 % — ABNORMAL HIGH (ref 36.0–46.0)
Hemoglobin: 14.6 g/dL (ref 12.0–15.0)
Lymphocytes Relative: 40 %
Lymphs Abs: 3.4 10*3/uL (ref 0.7–4.0)
MCH: 28.9 pg (ref 26.0–34.0)
MCHC: 31.3 g/dL (ref 30.0–36.0)
MCV: 92.5 fL (ref 78.0–100.0)
MONO ABS: 0.5 10*3/uL (ref 0.1–1.0)
MONOS PCT: 5 %
Neutro Abs: 3.8 10*3/uL (ref 1.7–7.7)
Neutrophils Relative %: 44 %
Platelets: 258 10*3/uL (ref 150–400)
RBC: 5.05 MIL/uL (ref 3.87–5.11)
RDW: 13.8 % (ref 11.5–15.5)
WBC: 8.7 10*3/uL (ref 4.0–10.5)

## 2017-12-15 LAB — COMPREHENSIVE METABOLIC PANEL
ALBUMIN: 3.7 g/dL (ref 3.5–5.0)
ALT: 12 U/L — ABNORMAL LOW (ref 14–54)
ANION GAP: 12 (ref 5–15)
AST: 19 U/L (ref 15–41)
Alkaline Phosphatase: 48 U/L (ref 38–126)
BUN: 18 mg/dL (ref 6–20)
CHLORIDE: 106 mmol/L (ref 101–111)
CO2: 24 mmol/L (ref 22–32)
Calcium: 9 mg/dL (ref 8.9–10.3)
Creatinine, Ser: 0.96 mg/dL (ref 0.44–1.00)
GFR calc Af Amer: >60 mL/min (ref >60)
GFR calc non Af Amer: 55 mL/min — ABNORMAL LOW (ref >60)
GLUCOSE: 105 mg/dL — AB (ref 65–99)
POTASSIUM: 4.1 mmol/L (ref 3.5–5.1)
SODIUM: 142 mmol/L (ref 135–145)
Total Bilirubin: 1.2 mg/dL (ref 0.3–1.2)
Total Protein: 6 g/dL — ABNORMAL LOW (ref 6.5–8.1)

## 2017-12-15 LAB — I-STAT ARTERIAL BLOOD GAS, ED
ACID-BASE EXCESS: 2 mmol/L (ref 0.0–2.0)
Bicarbonate: 27.1 mmol/L (ref 20.0–28.0)
O2 Saturation: 98 %
PCO2 ART: 43.8 mmHg (ref 32.0–48.0)
PH ART: 7.399 (ref 7.350–7.450)
PO2 ART: 103 mmHg (ref 83.0–108.0)
Patient temperature: 98.6
TCO2: 28 mmol/L (ref 22–32)

## 2017-12-15 LAB — BRAIN NATRIURETIC PEPTIDE: B Natriuretic Peptide: 129.1 pg/mL — ABNORMAL HIGH (ref 0.0–100.0)

## 2017-12-15 LAB — MRSA PCR SCREENING: MRSA BY PCR: POSITIVE — AB

## 2017-12-15 LAB — I-STAT CG4 LACTIC ACID, ED: Lactic Acid, Venous: 1.35 mmol/L (ref 0.5–1.9)

## 2017-12-15 MED ORDER — SENNOSIDES-DOCUSATE SODIUM 8.6-50 MG PO TABS: 1.0000 | ORAL_TABLET | Freq: Every evening | ORAL | Status: DC | PRN

## 2017-12-15 MED ORDER — ENSURE ENLIVE PO LIQD
237.0000 mL | Freq: Every day | ORAL | Status: DC
  Administered 2017-12-16: 237 mL via ORAL

## 2017-12-15 MED ORDER — ONDANSETRON HCL 4 MG PO TABS: 4.0000 mg | ORAL_TABLET | Freq: Four times a day (QID) | ORAL | Status: DC | PRN

## 2017-12-15 MED ORDER — ACETAMINOPHEN 325 MG PO TABS: 650.0000 mg | ORAL_TABLET | Freq: Four times a day (QID) | ORAL | Status: DC | PRN

## 2017-12-15 MED ORDER — ORAL CARE MOUTH RINSE
15.0000 mL | Freq: Two times a day (BID) | OROMUCOSAL | Status: DC
  Administered 2017-12-15 – 2017-12-16 (×2): 15 mL via OROMUCOSAL

## 2017-12-15 MED ORDER — FUROSEMIDE 40 MG PO TABS
40.0000 mg | ORAL_TABLET | Freq: Every day | ORAL | Status: DC
  Administered 2017-12-15 – 2017-12-16 (×2): 40 mg via ORAL
  Filled 2017-12-15: qty 2
  Filled 2017-12-15: qty 1

## 2017-12-15 MED ORDER — ALBUTEROL SULFATE (2.5 MG/3ML) 0.083% IN NEBU: 2.5000 mg | INHALATION_SOLUTION | RESPIRATORY_TRACT | Status: DC | PRN

## 2017-12-15 MED ORDER — SODIUM CHLORIDE 0.9% FLUSH
3.0000 mL | Freq: Two times a day (BID) | INTRAVENOUS | Status: DC
  Administered 2017-12-15 – 2017-12-16 (×3): 3 mL via INTRAVENOUS

## 2017-12-15 MED ORDER — SODIUM CHLORIDE 0.9% FLUSH: 3.0000 mL | INTRAVENOUS | Status: DC | PRN

## 2017-12-15 MED ORDER — ACETAMINOPHEN 650 MG RE SUPP: 650.0000 mg | Freq: Four times a day (QID) | RECTAL | Status: DC | PRN

## 2017-12-15 MED ORDER — IRBESARTAN 150 MG PO TABS
150.0000 mg | ORAL_TABLET | Freq: Every day | ORAL | Status: DC
  Administered 2017-12-15 – 2017-12-16 (×2): 150 mg via ORAL
  Filled 2017-12-15 (×2): qty 1

## 2017-12-15 MED ORDER — METHYLPREDNISOLONE SODIUM SUCC 40 MG IJ SOLR
40.0000 mg | Freq: Three times a day (TID) | INTRAMUSCULAR | Status: DC
  Administered 2017-12-15 (×2): 40 mg via INTRAVENOUS
  Filled 2017-12-15 (×2): qty 1

## 2017-12-15 MED ORDER — CARBIDOPA-LEVODOPA 25-100 MG PO TABS
1.5000 | ORAL_TABLET | Freq: Two times a day (BID) | ORAL | Status: DC
  Administered 2017-12-15 – 2017-12-16 (×3): 1.5 via ORAL
  Filled 2017-12-15 (×3): qty 2

## 2017-12-15 MED ORDER — ALPRAZOLAM 0.5 MG PO TABS
0.5000 mg | ORAL_TABLET | Freq: Three times a day (TID) | ORAL | Status: DC
  Administered 2017-12-15 – 2017-12-16 (×4): 0.5 mg via ORAL
  Filled 2017-12-15 (×3): qty 1
  Filled 2017-12-15: qty 2

## 2017-12-15 MED ORDER — BUDESONIDE 0.5 MG/2ML IN SUSP
0.5000 mg | Freq: Two times a day (BID) | RESPIRATORY_TRACT | Status: DC
  Administered 2017-12-15 – 2017-12-16 (×3): 0.5 mg via RESPIRATORY_TRACT
  Filled 2017-12-15 (×3): qty 2

## 2017-12-15 MED ORDER — METHYLPREDNISOLONE SODIUM SUCC 40 MG IJ SOLR
40.0000 mg | Freq: Two times a day (BID) | INTRAMUSCULAR | Status: DC
  Administered 2017-12-16: 40 mg via INTRAVENOUS
  Filled 2017-12-15: qty 1

## 2017-12-15 MED ORDER — ENOXAPARIN SODIUM 40 MG/0.4ML ~~LOC~~ SOLN
40.0000 mg | SUBCUTANEOUS | Status: DC
  Administered 2017-12-15: 40 mg via SUBCUTANEOUS
  Filled 2017-12-15 (×2): qty 0.4

## 2017-12-15 MED ORDER — ARFORMOTEROL TARTRATE 15 MCG/2ML IN NEBU
15.0000 ug | INHALATION_SOLUTION | Freq: Two times a day (BID) | RESPIRATORY_TRACT | Status: DC
  Administered 2017-12-15 – 2017-12-16 (×3): 15 ug via RESPIRATORY_TRACT
  Filled 2017-12-15 (×3): qty 2

## 2017-12-15 MED ORDER — PANTOPRAZOLE SODIUM 40 MG PO TBEC
40.0000 mg | DELAYED_RELEASE_TABLET | Freq: Every day | ORAL | Status: DC
  Administered 2017-12-15 – 2017-12-16 (×2): 40 mg via ORAL
  Filled 2017-12-15 (×2): qty 1

## 2017-12-15 MED ORDER — FAMOTIDINE 20 MG PO TABS
20.0000 mg | ORAL_TABLET | Freq: Two times a day (BID) | ORAL | Status: DC
  Administered 2017-12-15 – 2017-12-16 (×3): 20 mg via ORAL
  Filled 2017-12-15 (×3): qty 1

## 2017-12-15 MED ORDER — ASPIRIN 81 MG PO CHEW
81.0000 mg | CHEWABLE_TABLET | Freq: Every day | ORAL | Status: DC
  Administered 2017-12-15 – 2017-12-16 (×2): 81 mg via ORAL
  Filled 2017-12-15 (×3): qty 1

## 2017-12-15 MED ORDER — LEVOFLOXACIN IN D5W 750 MG/150ML IV SOLN
750.0000 mg | INTRAVENOUS | Status: DC
  Administered 2017-12-15: 750 mg via INTRAVENOUS
  Filled 2017-12-15: qty 150

## 2017-12-15 MED ORDER — SODIUM CHLORIDE 0.9 % IV SOLN: 250.0000 mL | INTRAVENOUS | Status: DC | PRN

## 2017-12-15 MED ORDER — ONDANSETRON HCL 4 MG/2ML IJ SOLN: 4.0000 mg | Freq: Four times a day (QID) | INTRAMUSCULAR | Status: DC | PRN

## 2017-12-15 MED ORDER — VITAMIN D 1000 UNITS PO TABS
2000.0000 [IU] | ORAL_TABLET | Freq: Every day | ORAL | Status: DC
  Administered 2017-12-15 – 2017-12-16 (×2): 2000 [IU] via ORAL
  Filled 2017-12-15 (×2): qty 2

## 2017-12-15 MED ORDER — ALBUTEROL (5 MG/ML) CONTINUOUS INHALATION SOLN
10.0000 mg/h | INHALATION_SOLUTION | Freq: Once | RESPIRATORY_TRACT | Status: AC
Start: 2017-12-15 — End: 2017-12-15
  Administered 2017-12-15: 10 mg/h via RESPIRATORY_TRACT
  Filled 2017-12-15: qty 20

## 2017-12-15 MED ORDER — IBUPROFEN 200 MG PO TABS: 400.0000 mg | ORAL_TABLET | Freq: Four times a day (QID) | ORAL | Status: DC | PRN

## 2017-12-15 MED ORDER — SODIUM CHLORIDE 0.9% FLUSH
3.0000 mL | Freq: Two times a day (BID) | INTRAVENOUS | Status: DC
  Administered 2017-12-16: 3 mL via INTRAVENOUS

## 2017-12-15 MED ORDER — HYDRALAZINE HCL 20 MG/ML IJ SOLN: 10.0000 mg | INTRAMUSCULAR | Status: DC | PRN

## 2017-12-15 NOTE — H&P (Signed)
History and Physical    Marie [name] XTG:[id number] DOB: Aug 11, 1936 DOA: 12/15/2017  PCP: [name], MD   Patient coming from: Home  Chief Complaint: SOB, wheezing   HPI: Marie [name] is a 82 y.o. female with medical history significant for chronic diastolic CHF, COPD with chronic hypoxic respiratory failure, hypertension, Parkinson's disease, GERD, and anxiety, now presenting to the emergency department for evaluation of shortness of breath and wheezing.  Patient reports that she had been experiencing a productive cough with dyspnea a couple weeks ago, and improved significantly after treatment with azithromycin and had return to her usual state until yesterday when she experienced recurrent shortness of breath and wheezing.  She does not have much of a cough at this time.  Had increased bilateral pedal edema last week, Lasix was doubled by her PCP, and the swelling has resolved.  She denies chest pain or fevers.  Reports using her home nebulizer every 4 hours for the past day, but continues to worsen.  Reports that she did not take her blood pressure medicines or Lasix for the past couple days.  Patient was treated by EMS with 125 mg IV Solu-Medrol and albuterol neb x2 prior to arrival.  ED Course: Upon arrival to the ED, patient is found to be afebrile, saturating adequately on BiPAP, tachypneic with respiratory rate of 38, slightly tachycardic, and hypertensive.  EKG features a sinus tachycardia with rate 105 and PVCs.  Chest x-rays negative for acute cardiopulmonary disease.  Chemistry panel and CBC are unremarkable acid is reassuringly normal and troponin is undetectable.  Patient was placed on BiPAP and treated with continuous albuterol neb.  She has improved in the ED, but continues to be in some distress and requiring BiPAP.  She will be admitted to the stepdown unit for ongoing evaluation and management of acute exacerbation in COPD.  Review of Systems:  All other systems  reviewed and apart from HPI, are negative.  Past Medical History:  Diagnosis Date  . Balance disorder 2008.     Falls a lot.  She can be standing and then leans too far over to one side   . Bulging disc   . Chronic bronchitis ([place])    due to congestion at times, on prednisone and advair  . COPD (chronic obstructive pulmonary disease) ([place])   . Depression    many years ago  . GERD (gastroesophageal reflux disease)   . Hearing loss    mild  . Hyperlipidemia   . Hypertension   . Iatrogenic adrenal insufficiency ([place])   . Incontinence    not indicated at this visit.  Marland Kitchen LVH (left ventricular hypertrophy) due to hypertensive disease    a. 02/2017: echo showing an EF of 65-70% with moderate LVH, hyperdynamic LV with subvalvular gradient of 70 mmHg, SAM, and mild MR  . Neuropathy    legs stay numb   . Osteoarthritis    hands,   . Osteoporosis   . Shortness of breath dyspnea   . Tubulovillous adenoma of rectum   . Wears dentures   . Wears glasses     Past Surgical History:  Procedure Laterality Date  . ABDOMINAL HYSTERECTOMY    . APPENDECTOMY    . CHONDROPLASTY  09/19/2014   Procedure: CHONDROPLASTY;  Surgeon: Alta Corning, MD;  Location: [place];  Service: Orthopedics;;  . DILATION AND CURETTAGE OF UTERUS    . EXTERNAL FIXATION LEG  10/25/2012   Procedure: EXTERNAL FIXATION LEG;  Surgeon:  Rozanna Box, MD;  Location: [place];  Service: Orthopedics;  Laterality: Right;  . EYE SURGERY     bilateral cataract surgery and lens implant  . FINGER SURGERY     fusions and debridements for OA  . INCONTINENCE SURGERY     multiple procedures, not cured  . KNEE ARTHROSCOPY WITH LATERAL MENISECTOMY Right 09/19/2014   Procedure: KNEE ARTHROSCOPY WITH LATERAL MENISECTOMY;  Surgeon: Alta Corning, MD;  Location: [place];  Service: Orthopedics;  Laterality: Right;  . KNEE ARTHROSCOPY WITH MEDIAL MENISECTOMY Right 09/19/2014   Procedure: RIGHT KNEE ARTHROSCOPY  WITH MEDIAL AND LATERAL MENISECTOMIES. CHONDROPLASTY OF PATELLA-FEMORAL JOINT;  Surgeon: Alta Corning, MD;  Location: [place];  Service: Orthopedics;  Laterality: Right;  . RECTAL BIOPSY  09/21/2011   Procedure: BIOPSY RECTAL;  Surgeon: Merrie Roof, MD;  Location: [place];  Service: General;  Laterality: N/A;  3-4 cm  . RECTAL SURGERY     by dr. Marlou Starks, removal of polyp  . TONSILLECTOMY       reports that  has never smoked. she has never used smokeless tobacco. She reports that she does not drink alcohol or use drugs.  Allergies  Allergen Reactions  . Ertapenem Hives    Caused whole body to turn red Other reaction(s): Redness Caused whole body to turn red  . Codeine Other (See Comments)    hallucinations  . Demerol Other (See Comments)    hallucinations  . Morphine And Related Other (See Comments)    hallucinations  . Other Other (See Comments)    Invan 7- pt became red all over  . Sulfate Other (See Comments)    unknown    Family History  Problem Relation Age of Onset  . Cancer Sister        pt unaware of what kind  . High blood pressure Mother   . COPD Mother   . Heart attack Father      Prior to Admission medications   Medication Sig Start Date End Date Taking? Authorizing Provider  albuterol (PROVENTIL) (2.5 MG/3ML) 0.083% nebulizer solution Use 1 ampule in nebulizer 4 times a day or every 4 hours as needed to rescue asthma. 12/03/17 12/03/18 Yes [name], MD  ALPRAZolam Duanne Moron) 0.5 MG tablet Take 1 tablet 3 x / day for Anxiety & Air Hunger Patient taking differently: Take 0.5 mg by mouth 3 (three) times daily.  12/09/17  Yes [name], MD  aspirin 81 MG tablet Take 81 mg by mouth daily. Buys OTC   Yes [provider]  budesonide (PULMICORT) 0.5 MG/2ML nebulizer solution Take 2 mLs (0.5 mg total) by nebulization 2 (two) times daily. Dx-J45.909, J44.0, J20.9 05/13/17  Yes [name], MD  carbidopa-levodopa (SINEMET IR) 25-100  MG tablet Take 1.5 tablets by mouth 2 (two) times daily. 10/04/17  Yes Patel, Donika K, DO  Cholecalciferol (VITAMIN D) 2000 units CAPS Take 1 capsule by mouth daily.   Yes [provider]  ENSURE (ENSURE) Take 237 mLs by mouth daily.   Yes [provider]  famotidine (PEPCID) 20 MG tablet Take 20 mg by mouth 2 (two) times daily.    Yes [provider]  formoterol (PERFOROMIST) 20 MCG/2ML nebulizer solution Take 2 mLs (20 mcg total) by nebulization 2 (two) times daily. 09/29/17  Yes Liane Comber, NP  furosemide (LASIX) 40 MG tablet Take 1 tablet daily for fluid retention or ankle swelling Patient taking differently: Take 40 mg  by mouth daily.  12/09/17 07/09/18 Yes [name], MD  olmesartan (BENICAR) 20 MG tablet Take 1 tablet daily for BP (replaces Losartan) Patient taking differently: Take 20 mg by mouth daily.  09/03/17 04/03/18 Yes [name], MD  omeprazole (PRILOSEC) 40 MG capsule Take 1 capsule daily for Acid Reflux 09/13/17 04/13/18 Yes [name], MD  PROCTOZONE-HC 2.5 % rectal cream Apply 1 application topically as needed for hemorrhoids.  08/30/17  Yes [provider]  sodium chloride HYPERTONIC 3 % nebulizer solution Take 1 vial by nebulization BID Patient taking differently: Take 4 mLs by nebulization 2 (two) times daily.  02/24/17  Yes Juanito Doom, MD    Physical Exam: Vitals:   12/15/17 0235 12/15/17 0321 12/15/17 0338 12/15/17 0346  BP:    130/61  Pulse: (!) 103   (!) 101  Resp: (!) 38   20  Temp:      TempSrc:      SpO2: 99% 98% 98% 97%      Constitutional: Tachypneic, dyspneic with speech, no pallor  Eyes: PERTLA, lids and conjunctivae normal ENMT: Mucous membranes are moist. Posterior pharynx clear of any exudate or lesions.   Neck: normal, supple, no masses, no thyromegaly Respiratory: Diminished bilaterally, prolonged expiratory phase, diffuse wheezes. No pallor or cyanosis. Cardiovascular: Rate ~110 and  regular. No extremity edema. No significant JVD. Abdomen: No distension, no tenderness, no masses palpated. Bowel sounds normal.  Musculoskeletal: no clubbing / cyanosis. No joint deformity upper and lower extremities.  Skin: no significant rashes, lesions, ulcers. Warm, dry, well-perfused. Neurologic: CN 2-12 grossly intact. Sensation intact. Strength 5/5 in all 4 limbs.  Psychiatric: Alert and oriented x 3. Calm, cooperative.     Labs on Admission: I have personally reviewed following labs and imaging studies  CBC: Recent Labs  Lab 12/15/17 0216  WBC 8.7  NEUTROABS 3.8  HGB 14.6  HCT 46.7*  MCV 92.5  PLT 347   Basic Metabolic Panel: Recent Labs  Lab 12/15/17 0216  NA 142  K 4.1  CL 106  CO2 24  GLUCOSE 105*  BUN 18  CREATININE 0.96  CALCIUM 9.0   GFR: Estimated Creatinine Clearance: 39.3 mL/min (by C-G formula based on SCr of 0.96 mg/dL). Liver Function Tests: Recent Labs  Lab 12/15/17 0216  AST 19  ALT 12*  ALKPHOS 48  BILITOT 1.2  PROT 6.0*  ALBUMIN 3.7   No results for input(s): LIPASE, AMYLASE in the last 168 hours. No results for input(s): AMMONIA in the last 168 hours. Coagulation Profile: No results for input(s): INR, PROTIME in the last 168 hours. Cardiac Enzymes: No results for input(s): CKTOTAL, CKMB, CKMBINDEX, TROPONINI in the last 168 hours. BNP (last 3 results) No results for input(s): PROBNP in the last 8760 hours. HbA1C: No results for input(s): HGBA1C in the last 72 hours. CBG: No results for input(s): GLUCAP in the last 168 hours. Lipid Profile: No results for input(s): CHOL, HDL, LDLCALC, TRIG, CHOLHDL, LDLDIRECT in the last 72 hours. Thyroid Function Tests: No results for input(s): TSH, T4TOTAL, FREET4, T3FREE, THYROIDAB in the last 72 hours. Anemia Panel: No results for input(s): VITAMINB12, FOLATE, FERRITIN, TIBC, IRON, RETICCTPCT in the last 72 hours. Urine analysis:    Component Value Date/Time   COLORURINE YELLOW  02/17/2017 [place] 02/17/2017 1229   LABSPEC 1.015 02/17/2017 1229   PHURINE 6.5 02/17/2017 1229   GLUCOSEU NEGATIVE 02/17/2017 1229   HGBUR TRACE (A) 02/17/2017 [place] 02/17/2017  [place] 02/17/2017 1229   PROTEINUR NEGATIVE 02/17/2017 1229   UROBILINOGEN 0.2 04/16/2015 2310   NITRITE NEGATIVE 02/17/2017 1229   LEUKOCYTESUR NEGATIVE 02/17/2017 1229   Sepsis Labs: @LABRCNTIP (procalcitonin:4,lacticidven:4) )No results found for this or any previous visit (from the past 240 hour(s)).   Radiological Exams on Admission: Dg Chest Port 1 View  Result Date: 12/15/2017 CLINICAL DATA:  82 year old female with shortness of breath and wheezing. EXAM: PORTABLE CHEST 1 VIEW COMPARISON:  Chest radiograph dated 10/20/2017 FINDINGS: Chronic bronchitic changes. There is no focal consolidation, pleural effusion, or pneumothorax. The cardiac silhouette is within normal limits. Atherosclerotic calcification of the aortic arch. No acute osseous pathology. IMPRESSION: No active disease. Electronically Signed   By: Anner Crete M.D.   On: 12/15/2017 02:25    EKG: Independently reviewed. Sinus tachycardia (rate 105), PVC's.   Assessment/Plan  1. COPD with acute exacerbation; acute on chronic respiratory failure  - Presents with 1-2 days of SOB and wheezing  - CXR clear, no fever or leukocytosis, no peripheral edema or JVD - In distress on arrival, treated with 125 mg IV Solu-Medrol and albuterol neb x2 with EMS prior to arrival, started on BiPAP in ED  - Continue ICS/LABA, continue albuterol nebs, check sputum culture, continue systemic steroid, start abx   2. Chronic diastolic CHF  - Appears well-compensated, BNP pending  - PCP increased Lasix to 40 mg qD last week for increased pedal edema, now resolved  - Follow daily wts, continue Lasix and ARB    3. Parkinson's disease  - Continue Sinemet    4. Hypertension  - BP elevated in ED  -  Continue ARB, use hydralazine IVP's prn   5. Anxiety  - Continue Xanax     DVT prophylaxis: Lovenox Code Status: Full  Family Communication: Husband and daughter updated at bedside Disposition Plan: Admit to SDU  Consults called: None Admission status: Inpatient    Vianne Bulls, MD [facility] Pager [phone]  If 7PM-7AM, please contact night-coverage [email or web address] Password [facility]  12/15/2017, 4:04 AM

## 2017-12-15 NOTE — ED Provider Notes (Signed)
[facility] EMERGENCY DEPARTMENT Provider Note   CSN: [id number] Arrival date & time: 12/15/17  0216     History   Chief Complaint Chief Complaint  Patient presents with  . Respiratory Distress  . Wheezing    HPI Marie [name] is a 82 y.o. female.  Patient brought to the emergency department by [facility] for evaluation of difficulty breathing.  Patient has a history of COPD.  She reports that her breathing has progressively worsened over the course of the day.  She tried to use a nebulizer treatment tonight without improvement, called EMS.  They report that she was having severe wheezing and difficulty breathing upon their evaluation.  She was administered Solu-Medrol and continuous nebulizer treatment during transport.  Oxygen saturations improved on nebulizer and she was weaned to nasal cannula, maintaining her oxygen saturation but still with severe bronchospasm and dyspnea.  At arrival, patient reports that she is severely short of breath but does not have any chest pain.  She has not noticed any increased cough.  Has not had a fever.      Past Medical History:  Diagnosis Date  . Balance disorder 2008.     Falls a lot.  She can be standing and then leans too far over to one side   . Bulging disc   . Chronic bronchitis ([place])    due to congestion at times, on prednisone and advair  . COPD (chronic obstructive pulmonary disease) ([place])   . Depression    many years ago  . GERD (gastroesophageal reflux disease)   . Hearing loss    mild  . Hyperlipidemia   . Hypertension   . Iatrogenic adrenal insufficiency ([place])   . Incontinence    not indicated at this visit.  Marland Kitchen LVH (left ventricular hypertrophy) due to hypertensive disease    a. 02/2017: echo showing an EF of 65-70% with moderate LVH, hyperdynamic LV with subvalvular gradient of 70 mmHg, SAM, and mild MR  . Neuropathy    legs stay numb   . Osteoarthritis    hands,   . Osteoporosis   .  Shortness of breath dyspnea   . Tubulovillous adenoma of rectum   . Wears dentures   . Wears glasses     Patient Active Problem List   Diagnosis Date Noted  . COPD with acute exacerbation ([place]) 12/15/2017  . Acute on chronic respiratory failure with hypoxia ([place]) 12/15/2017  . Chronic diastolic CHF (congestive heart failure) ([place]) 12/15/2017  . Anxiety 12/15/2017  . Subvalvular aortic stenosis determined by imaging 11/15/2017  . Pedal edema 11/15/2017  . Problem with medical care compliance 11/11/2017  . Aortic atherosclerosis ([place]) 10/19/2017  . Oropharyngeal dysphagia 09/28/2017  . Chronic respiratory failure with hypoxia, on home O2 therapy ([place]) 09/20/2017  . COPD with chronic bronchitis ([place]) 07/20/2017  . Hypertensive heart disease 03/29/2017  . Protein-calorie malnutrition, severe 03/17/2017  . Parkinson's plus syndrome ([place]) 03/04/2017  . Restrictive lung disease 02/24/2017  . LVH (left ventricular hypertrophy) 01/07/2017  . Depression, major, in remission ([place]) 09/22/2016  . At high risk for falls 10/16/2015  . Prediabetes 08/23/2014  . Vitamin D deficiency 08/23/2014  . Medication management 08/23/2014  . Unstable Gait 01/30/2014  . Incontinence   . Hyperlipidemia   . GERD (gastroesophageal reflux disease)   . Hereditary and idiopathic peripheral neuropathy   . Essential hypertension   . Tubulovillous adenoma of rectum 09/04/2011    Past Surgical History:  Procedure Laterality Date  .  ABDOMINAL HYSTERECTOMY    . APPENDECTOMY    . CHONDROPLASTY  09/19/2014   Procedure: CHONDROPLASTY;  Surgeon: Alta Corning, MD;  Location: [facility];  Service: Orthopedics;;  . DILATION AND CURETTAGE OF UTERUS    . EXTERNAL FIXATION LEG  10/25/2012   Procedure: EXTERNAL FIXATION LEG;  Surgeon: Rozanna Box, MD;  Location: [facility];  Service: Orthopedics;  Laterality: Right;  . EYE SURGERY     bilateral cataract surgery and lens implant  . FINGER SURGERY      fusions and debridements for OA  . INCONTINENCE SURGERY     multiple procedures, not cured  . KNEE ARTHROSCOPY WITH LATERAL MENISECTOMY Right 09/19/2014   Procedure: KNEE ARTHROSCOPY WITH LATERAL MENISECTOMY;  Surgeon: Alta Corning, MD;  Location: [facility];  Service: Orthopedics;  Laterality: Right;  . KNEE ARTHROSCOPY WITH MEDIAL MENISECTOMY Right 09/19/2014   Procedure: RIGHT KNEE ARTHROSCOPY WITH MEDIAL AND LATERAL MENISECTOMIES. CHONDROPLASTY OF PATELLA-FEMORAL JOINT;  Surgeon: Alta Corning, MD;  Location: [facility];  Service: Orthopedics;  Laterality: Right;  . RECTAL BIOPSY  09/21/2011   Procedure: BIOPSY RECTAL;  Surgeon: Merrie Roof, MD;  Location: [facility];  Service: General;  Laterality: N/A;  3-4 cm  . RECTAL SURGERY     by dr. Marlou Starks, removal of polyp  . TONSILLECTOMY      OB History    No data available       Home Medications    Prior to Admission medications   Medication Sig Start Date End Date Taking? Authorizing Provider  albuterol (PROVENTIL) (2.5 MG/3ML) 0.083% nebulizer solution Use 1 ampule in nebulizer 4 times a day or every 4 hours as needed to rescue asthma. 12/03/17 12/03/18 Yes [name], MD  ALPRAZolam Duanne Moron) 0.5 MG tablet Take 1 tablet 3 x / day for Anxiety & Air Hunger Patient taking differently: Take 0.5 mg by mouth 3 (three) times daily.  12/09/17  Yes [name], MD  aspirin 81 MG tablet Take 81 mg by mouth daily. Buys OTC   Yes [provider]  budesonide (PULMICORT) 0.5 MG/2ML nebulizer solution Take 2 mLs (0.5 mg total) by nebulization 2 (two) times daily. Dx-J45.909, J44.0, J20.9 05/13/17  Yes [name], MD  carbidopa-levodopa (SINEMET IR) 25-100 MG tablet Take 1.5 tablets by mouth 2 (two) times daily. 10/04/17  Yes Patel, Donika K, DO  Cholecalciferol (VITAMIN D) 2000 units CAPS Take 1 capsule by mouth daily.   Yes [provider]  ENSURE (ENSURE) Take 237 mLs by mouth daily.   Yes  [provider]  famotidine (PEPCID) 20 MG tablet Take 20 mg by mouth 2 (two) times daily.    Yes [provider]  formoterol (PERFOROMIST) 20 MCG/2ML nebulizer solution Take 2 mLs (20 mcg total) by nebulization 2 (two) times daily. 09/29/17  Yes Liane Comber, NP  furosemide (LASIX) 40 MG tablet Take 1 tablet daily for fluid retention or ankle swelling Patient taking differently: Take 40 mg by mouth daily.  12/09/17 07/09/18 Yes [name], MD  olmesartan (BENICAR) 20 MG tablet Take 1 tablet daily for BP (replaces Losartan) Patient taking differently: Take 20 mg by mouth daily.  09/03/17 04/03/18 Yes [name], MD  omeprazole (PRILOSEC) 40 MG capsule Take 1 capsule daily for Acid Reflux 09/13/17 04/13/18 Yes [name], MD  PROCTOZONE-HC 2.5 % rectal cream Apply 1 application topically as needed for hemorrhoids.  08/30/17  Yes [provider]  sodium  chloride HYPERTONIC 3 % nebulizer solution Take 1 vial by nebulization BID Patient taking differently: Take 4 mLs by nebulization 2 (two) times daily.  02/24/17  Yes Juanito Doom, MD    Family History Family History  Problem Relation Age of Onset  . Cancer Sister        pt unaware of what kind  . High blood pressure Mother   . COPD Mother   . Heart attack Father     Social History Social History   Tobacco Use  . Smoking status: Never Smoker  . Smokeless tobacco: Never Used  Substance Use Topics  . Alcohol use: No  . Drug use: No     Allergies   Ertapenem; Codeine; Demerol; Morphine and related; Other; and Sulfate   Review of Systems Review of Systems  Respiratory: Positive for shortness of breath and wheezing.   All other systems reviewed and are negative.    Physical Exam Updated Vital Signs BP 130/61 (BP Location: Right Arm)   Pulse (!) 101   Temp 98.8 F (37.1 C) (Temporal)   Resp 20   SpO2 97%   Physical Exam  Constitutional: She is oriented to person, place, and  time. She appears well-developed and well-nourished. No distress.  HENT:  Head: Normocephalic and atraumatic.  Right Ear: Hearing normal.  Left Ear: Hearing normal.  Nose: Nose normal.  Mouth/Throat: Oropharynx is clear and moist and mucous membranes are normal.  Eyes: Conjunctivae and EOM are normal. Pupils are equal, round, and reactive to light.  Neck: Normal range of motion. Neck supple.  Cardiovascular: Regular rhythm, S1 normal and S2 normal. Exam reveals no gallop and no friction rub.  No murmur heard. Pulmonary/Chest: Accessory muscle usage present. Tachypnea noted. No respiratory distress. She has wheezes. She exhibits no tenderness.  Abdominal: Soft. Normal appearance and bowel sounds are normal. There is no hepatosplenomegaly. There is no tenderness. There is no rebound, no guarding, no tenderness at McBurney's point and negative Murphy's sign. No hernia.  Musculoskeletal: Normal range of motion.  Neurological: She is alert and oriented to person, place, and time. She has normal strength. No cranial nerve deficit or sensory deficit. Coordination normal. GCS eye subscore is 4. GCS verbal subscore is 5. GCS motor subscore is 6.  Skin: Skin is warm, dry and intact. No rash noted. No cyanosis.  Psychiatric: She has a normal mood and affect. Her speech is normal and behavior is normal. Thought content normal.  Nursing note and vitals reviewed.    ED Treatments / Results  Labs (all labs ordered are listed, but only abnormal results are displayed) Labs Reviewed  CBC WITH DIFFERENTIAL/PLATELET - Abnormal; Notable for the following components:      Result Value   HCT 46.7 (*)    Eosinophils Absolute 0.9 (*)    All other components within normal limits  COMPREHENSIVE METABOLIC PANEL - Abnormal; Notable for the following components:   Glucose, Bld 105 (*)    Total Protein 6.0 (*)    ALT 12 (*)    GFR calc non Af Amer 55 (*)    All other components within normal limits  CULTURE,  EXPECTORATED SPUTUM-ASSESSMENT  BRAIN NATRIURETIC PEPTIDE  I-STAT CG4 LACTIC ACID, ED  I-STAT ARTERIAL BLOOD GAS, ED  I-STAT TROPONIN, ED    EKG  EKG Interpretation  Date/Time:  Wednesday December 15 2017 02:35:11 EST Ventricular Rate:  105 PR Interval:    QRS Duration: 112 QT Interval:  372 QTC Calculation: 492 R  Axis:   2 Text Interpretation:  Sinus tachycardia Multiple premature complexes, vent & supraven Consider right atrial enlargement Artifact in lead(s) I II III aVR aVL aVF V1 V2 V3 V4 V5 V6 Confirmed by Orpah Greek [phone]) on 12/15/2017 3:24:15 AM       Radiology Dg Chest Port 1 View  Result Date: 12/15/2017 CLINICAL DATA:  82 year old female with shortness of breath and wheezing. EXAM: PORTABLE CHEST 1 VIEW COMPARISON:  Chest radiograph dated 10/20/2017 FINDINGS: Chronic bronchitic changes. There is no focal consolidation, pleural effusion, or pneumothorax. The cardiac silhouette is within normal limits. Atherosclerotic calcification of the aortic arch. No acute osseous pathology. IMPRESSION: No active disease. Electronically Signed   By: Anner Crete M.D.   On: 12/15/2017 02:25    Procedures Procedures (including critical care time)  Medications Ordered in ED Medications  albuterol (PROVENTIL) (2.5 MG/3ML) 0.083% nebulizer solution 2.5 mg (not administered)  ALPRAZolam (XANAX) tablet 0.5 mg (not administered)  aspirin chewable tablet 81 mg (not administered)  budesonide (PULMICORT) nebulizer solution 0.5 mg (not administered)  carbidopa-levodopa (SINEMET IR) 25-100 MG per tablet immediate release 1.5 tablet (not administered)  cholecalciferol (VITAMIN D) tablet 2,000 Units (not administered)  ENSURE liquid 237 mL (not administered)  famotidine (PEPCID) tablet 20 mg (not administered)  arformoterol (BROVANA) nebulizer solution 15 mcg (not administered)  furosemide (LASIX) tablet 40 mg (not administered)  irbesartan (AVAPRO) tablet 150 mg (not  administered)  pantoprazole (PROTONIX) EC tablet 40 mg (not administered)  methylPREDNISolone sodium succinate (SOLU-MEDROL) 40 mg/mL injection 40 mg (not administered)  enoxaparin (LOVENOX) injection 40 mg (not administered)  sodium chloride flush (NS) 0.9 % injection 3 mL (not administered)  sodium chloride flush (NS) 0.9 % injection 3 mL (not administered)  sodium chloride flush (NS) 0.9 % injection 3 mL (not administered)  0.9 %  sodium chloride infusion (not administered)  acetaminophen (TYLENOL) tablet 650 mg (not administered)    Or  acetaminophen (TYLENOL) suppository 650 mg (not administered)  ibuprofen (ADVIL,MOTRIN) tablet 400 mg (not administered)  senna-docusate (Senokot-S) tablet 1 tablet (not administered)  ondansetron (ZOFRAN) tablet 4 mg (not administered)    Or  ondansetron (ZOFRAN) injection 4 mg (not administered)  levofloxacin (LEVAQUIN) IVPB 750 mg (not administered)  hydrALAZINE (APRESOLINE) injection 10 mg (not administered)  albuterol (PROVENTIL,VENTOLIN) solution continuous neb (10 mg/hr Nebulization Given 12/15/17 0310)     Initial Impression / Assessment and Plan / ED Course  I have reviewed the triage vital signs and the nursing notes.  Pertinent labs & imaging results that were available during my care of the patient were reviewed by me and considered in my medical decision making (see chart for details).     Presents to the ER for evaluation from home.  Patient has a history of COPD, has been experiencing progressively worsening shortness of breath all day.  She has been using her albuterol but has not had any improvement.  Patient was in distress at arrival improved with BiPAP, continuous nebulizer treatment.  Blood gas does not show significant CO2 retention.  Oxygenation is adequate on BiPAP.  Patient will require hospitalization for COPD exacerbation with acute respiratory failure.  CRITICAL CARE Performed by: Orpah Greek   Total  critical care time: 30 minutes  Critical care time was exclusive of separately billable procedures and treating other patients.  Critical care was necessary to treat or prevent imminent or life-threatening deterioration.  Critical care was time spent personally by me on the following activities: development of  treatment plan with patient and/or surrogate as well as nursing, discussions with consultants, evaluation of patient's response to treatment, examination of patient, obtaining history from patient or surrogate, ordering and performing treatments and interventions, ordering and review of laboratory studies, ordering and review of radiographic studies, pulse oximetry and re-evaluation of patient's condition.   Final Clinical Impressions(s) / ED Diagnoses   Final diagnoses:  COPD exacerbation ([place])  Acute respiratory failure with hypoxia [facility])    ED Discharge Orders    None       Rheba Diamond, Gwenyth Allegra, MD 12/15/17 [phone]

## 2017-12-15 NOTE — Plan of Care (Signed)
Patient is progressing towards care plan goals. 

## 2017-12-15 NOTE — Progress Notes (Signed)
Patient was wearing BIPAP with IPAP=15cm and EPAP=5cm and 40% while arterial gas was drawn. Results given to respiratory therapist.

## 2017-12-15 NOTE — Progress Notes (Signed)
RT note: Trial patient off BIPAP. Placed patient on 2L nasal canula. Parient oriented and vital sings stable at this time.

## 2017-12-15 NOTE — ED Triage Notes (Signed)
Pt presents from home with [facility] for resp distress; pt reported having sob all day yesterday gradually worsening; EMS gave 2 albuterol treatments and 125mg  solumedrol

## 2017-12-15 NOTE — Progress Notes (Signed)
Pt transferred from Trauma B to R15 w/o complications. Pt is stable at this time no distress noted. Pt remains on BiPAP

## 2017-12-15 NOTE — ED Notes (Signed)
Purewick applied.

## 2017-12-15 NOTE — Progress Notes (Signed)
PROGRESS NOTE   Marie [name]  GXQ:[id number]    DOB: 06-02-1936    DOA: 12/15/2017  PCP: [name], MD   I have briefly reviewed patients previous medical records in [facility].  Brief Narrative:  82 year old female with PMH of chronic diastolic CHF, COPD, chronic respiratory failure on home oxygen 2 L/min as needed PTA, HTN, Parkinson's disease, GERD, anxiety presented to the ED due to dyspnea and wheezing, recently treated with a course of azithromycin for productive cough and dyspnea couple weeks ago with improvement in symptoms.  Also Lasix dose was doubled by PCP due to leg swelling which is improved.  Admitted for COPD exacerbation and acute on chronic hypoxic respiratory failure.   Assessment & Plan:   Principal Problem:   COPD with acute exacerbation ([place]) Active Problems:   Essential hypertension   Parkinson's plus syndrome (H[facility])   Acute on chronic respiratory failure with hypoxia ([facility])   Chronic diastolic CHF (congestive heart failure) ([facility])   Anxiety   COPD exacerbation: Chest x-ray clear.  No overt features of decompensated CHF.  Presented to ED in extremis.  Received Solu-Medrol 125 mg IV x1 and albuterol nebulization x2 with EMS prior to arrival and was briefly on BiPAP in the ED.  Now off of BiPAP and saturating in the 90s on 2 L oxygen.  Continue IV Solu-Medrol, levofloxacin and bronchodilators.  Improving.  Monitor closely.  Acute on chronic hypoxic respiratory failure: Secondary to COPD exacerbation.  Improved.  Monitor closely.  Chronic diastolic CHF: Compensated at this time.  Continue Lasix 40 mg daily and ARB.  Essential hypertension: Controlled.  Parkinson's disease: Stable.     DVT prophylaxis: Lovenox Code Status: Full Family Communication: None at bedside Disposition: DC home when medically improved   Consultants:  None  Procedures:  BiPAP  Antimicrobials:  Levofloxacin   Subjective: Feels much better.  Dyspnea  significantly improved but dyspnea not yet at baseline.  Occasional wheezing.  Not much cough.  No chest pain.  ROS: As above  Objective:  Vitals:   12/15/17 1200 12/15/17 1215 12/15/17 1230 12/15/17 1322  BP: (!) 157/119 (!) 127/57 (!) 119/59 125/64  Pulse: 96 84 84 95  Resp: (!) 22 19 20  (!) 22  Temp:    97.8 F (36.6 C)  TempSrc:    Oral  SpO2: 97% 96% 98% 92%  Weight:    56.9 kg (125 lb 7.1 oz)  Height:    5' 4.5" (1.638 m)    Examination:  General exam: Pleasant elderly female, lying comfortably propped up in bed.  Small built and frail. Respiratory system: Clear to auscultation anteriorly.  Few scattered rhonchi posteriorly.  No crackles. Respiratory effort normal. Cardiovascular system: S1 & S2 heard, RRR. No JVD, murmurs, rubs, gallops or clicks. No pedal edema.  Telemetry sinus rhythm. Gastrointestinal system: Abdomen is nondistended, soft and nontender. No organomegaly or masses felt. Normal bowel sounds heard. Central nervous system: Alert and oriented. No focal neurological deficits. Extremities: Symmetric 5 x 5 power. Skin: No rashes, lesions or ulcers Psychiatry: Judgement and insight appear normal. Mood & affect appropriate.     Data Reviewed: I have personally reviewed following labs and imaging studies  CBC: Recent Labs  Lab 12/15/17 0216  WBC 8.7  NEUTROABS 3.8  HGB 14.6  HCT 46.7*  MCV 92.5  PLT 144   Basic Metabolic Panel: Recent Labs  Lab 12/15/17 0216  NA 142  K 4.1  CL 106  CO2 24  GLUCOSE  105*  BUN 18  CREATININE 0.96  CALCIUM 9.0   Liver Function Tests: Recent Labs  Lab 12/15/17 0216  AST 19  ALT 12*  ALKPHOS 48  BILITOT 1.2  PROT 6.0*  ALBUMIN 3.7    No results found for this or any previous visit (from the past 240 hour(s)).       Radiology Studies: Dg Chest Port 1 View  Result Date: 12/15/2017 CLINICAL DATA:  82 year old female with shortness of breath and wheezing. EXAM: PORTABLE CHEST 1 VIEW COMPARISON:   Chest radiograph dated 10/20/2017 FINDINGS: Chronic bronchitic changes. There is no focal consolidation, pleural effusion, or pneumothorax. The cardiac silhouette is within normal limits. Atherosclerotic calcification of the aortic arch. No acute osseous pathology. IMPRESSION: No active disease. Electronically Signed   By: Anner Crete M.D.   On: 12/15/2017 02:25        Scheduled Meds: . ALPRAZolam  0.5 mg Oral TID  . arformoterol  15 mcg Nebulization Q12H  . aspirin  81 mg Oral Daily  . budesonide  0.5 mg Nebulization BID  . carbidopa-levodopa  1.5 tablet Oral BID  . cholecalciferol  2,000 Units Oral Daily  . enoxaparin (LOVENOX) injection  40 mg Subcutaneous Q24H  . famotidine  20 mg Oral BID  . feeding supplement (ENSURE ENLIVE)  237 mL Oral Daily  . furosemide  40 mg Oral Daily  . irbesartan  150 mg Oral Daily  . mouth rinse  15 mL Mouth Rinse BID  . methylPREDNISolone (SOLU-MEDROL) injection  40 mg Intravenous Q8H  . pantoprazole  40 mg Oral Daily  . sodium chloride flush  3 mL Intravenous Q12H  . sodium chloride flush  3 mL Intravenous Q12H   Continuous Infusions: . sodium chloride    . levofloxacin (LEVAQUIN) IV Stopped (12/15/17 0645)     LOS: 0 days     Vernell Leep, MD, FACP, [facility]. [facility] Pager [phone]  If 7PM-7AM, please contact night-coverage [email or web address] Password [facility] 12/15/2017, 3:41 PM

## 2017-12-15 NOTE — Care Management Note (Signed)
Case Management Note  Patient Details  Name: Marie [name] MRN: [id number] Date of Birth: 02-03-1936  Subjective/Objective:                  82 y.o. female with medical history significant for chronic diastolic CHF, COPD with chronic hypoxic respiratory failure, hypertension, Parkinson's disease, GERD, and anxiety, now presenting to the emergency department for evaluation of shortness of breath and wheezing.  From home with spouse.   Action/Plan: Admit status INPATIENT (COPD/RESP FAILURE); anticipate discharge [place].   Expected Discharge Date:  (unknown)               Expected Discharge Plan:  [place]  In-House Referral:  NA  Discharge planning Services  CM Consult   Status of Service:  In process, will continue to follow  If discussed at Long Length of Stay Meetings, dates discussed:    Additional Comments:  Fuller Mandril, RN 12/15/2017, 9:20 AM

## 2017-12-15 NOTE — Progress Notes (Signed)
Patient admitted to 2W06, AOx4, VSS, lungs dimished. Connected to SDU monitor and called into CCMD for monitoring without issue. Daughter and husband at bedside. Daughter informs that patient coughs with thin liquids at times but regular foods are easier for patient to swallow, daughter reports as signs of progression of Parkinsons. SLP consult entered per protocol, meds given whole with applesauce with no s/s aspiration. Bed in lowest position, call bell and phone within reach, bed alarm on. Patient has no c/o pain or SOB at this point.

## 2017-12-16 DIAGNOSIS — J441 Chronic obstructive pulmonary disease with (acute) exacerbation: Secondary | ICD-10-CM | POA: Diagnosis not present

## 2017-12-16 DIAGNOSIS — F419 Anxiety disorder, unspecified: Secondary | ICD-10-CM

## 2017-12-16 DIAGNOSIS — G232 Striatonigral degeneration: Secondary | ICD-10-CM

## 2017-12-16 DIAGNOSIS — I10 Essential (primary) hypertension: Secondary | ICD-10-CM | POA: Diagnosis not present

## 2017-12-16 DIAGNOSIS — L8990 Pressure ulcer of unspecified site, unspecified stage: Secondary | ICD-10-CM

## 2017-12-16 DIAGNOSIS — J9621 Acute and chronic respiratory failure with hypoxia: Secondary | ICD-10-CM | POA: Diagnosis not present

## 2017-12-16 DIAGNOSIS — I5032 Chronic diastolic (congestive) heart failure: Secondary | ICD-10-CM

## 2017-12-16 MED ORDER — DOXYCYCLINE HYCLATE 100 MG PO TABS: 100.0000 mg | ORAL_TABLET | Freq: Two times a day (BID) | ORAL | 0 refills | Status: DC

## 2017-12-16 MED ORDER — PREDNISONE 10 MG PO TABS: ORAL_TABLET | ORAL | 0 refills | Status: DC

## 2017-12-16 MED ORDER — PREDNISONE 20 MG PO TABS
40.0000 mg | ORAL_TABLET | Freq: Every day | ORAL | Status: DC
  Administered 2017-12-16: 40 mg via ORAL
  Filled 2017-12-16: qty 2

## 2017-12-16 MED ORDER — DOXYCYCLINE HYCLATE 100 MG PO TABS
100.0000 mg | ORAL_TABLET | Freq: Two times a day (BID) | ORAL | Status: DC
  Administered 2017-12-16: 100 mg via ORAL
  Filled 2017-12-16: qty 1

## 2017-12-16 NOTE — Care Management Obs Status (Signed)
[place]   Patient Details  Name: CORNEISHA ALVI MRN: [id number] Date of Birth: [birth year]   Medicare Observation Status Notification Given:  Yes    Zenon Mayo, RN 12/16/2017, 12:16 PM

## 2017-12-16 NOTE — Care Management CC44 (Signed)
Condition Code 44 Documentation Completed  Patient Details  Name: Marie [name] MRN: [id number] Date of Birth: [birth year]   Condition Code 44 given:  Yes Patient signature on Condition Code 44 notice:  Yes Documentation of 2 MD's agreement:  Yes Code 44 added to claim:  Yes    Zenon Mayo, RN 12/16/2017, 12:16 PM

## 2017-12-16 NOTE — Evaluation (Signed)
Clinical/Bedside Swallow Evaluation Patient Details  Name: Marie [name] MRN: [id number] Date of Birth: Sep 30, 1936  Today's Date: 12/16/2017 Time: SLP Start Time (ACUTE ONLY): 0850 SLP Stop Time (ACUTE ONLY): 0920 SLP Time Calculation (min) (ACUTE ONLY): 30 min  Past Medical History:  Past Medical History:  Diagnosis Date  . Balance disorder 2008.     Falls a lot.  She can be standing and then leans too far over to one side   . Bulging disc   . Chronic bronchitis ([place])    due to congestion at times, on prednisone and advair  . COPD (chronic obstructive pulmonary disease) ([place])   . Depression    many years ago  . GERD (gastroesophageal reflux disease)   . Hearing loss    mild  . Hyperlipidemia   . Hypertension   . Iatrogenic adrenal insufficiency ([place])   . Incontinence    not indicated at this visit.  Marland Kitchen LVH (left ventricular hypertrophy) due to hypertensive disease    a. 02/2017: echo showing an EF of 65-70% with moderate LVH, hyperdynamic LV with subvalvular gradient of 70 mmHg, SAM, and mild MR  . Neuropathy    legs stay numb   . Osteoarthritis    hands,   . Osteoporosis   . Shortness of breath dyspnea   . Tubulovillous adenoma of rectum   . Wears dentures   . Wears glasses    Past Surgical History:  Past Surgical History:  Procedure Laterality Date  . ABDOMINAL HYSTERECTOMY    . APPENDECTOMY    . CHONDROPLASTY  09/19/2014   Procedure: CHONDROPLASTY;  Surgeon: Alta Corning, MD;  Location: [facility];  Service: Orthopedics;;  . DILATION AND CURETTAGE OF UTERUS    . EXTERNAL FIXATION LEG  10/25/2012   Procedure: EXTERNAL FIXATION LEG;  Surgeon: Rozanna Box, MD;  Location: [facility];  Service: Orthopedics;  Laterality: Right;  . EYE SURGERY     bilateral cataract surgery and lens implant  . FINGER SURGERY     fusions and debridements for OA  . INCONTINENCE SURGERY     multiple procedures, not cured  . KNEE ARTHROSCOPY WITH LATERAL MENISECTOMY Right  09/19/2014   Procedure: KNEE ARTHROSCOPY WITH LATERAL MENISECTOMY;  Surgeon: Alta Corning, MD;  Location: [facility];  Service: Orthopedics;  Laterality: Right;  . KNEE ARTHROSCOPY WITH MEDIAL MENISECTOMY Right 09/19/2014   Procedure: RIGHT KNEE ARTHROSCOPY WITH MEDIAL AND LATERAL MENISECTOMIES. CHONDROPLASTY OF PATELLA-FEMORAL JOINT;  Surgeon: Alta Corning, MD;  Location: [facility];  Service: Orthopedics;  Laterality: Right;  . RECTAL BIOPSY  09/21/2011   Procedure: BIOPSY RECTAL;  Surgeon: Merrie Roof, MD;  Location: [facility];  Service: General;  Laterality: N/A;  3-4 cm  . RECTAL SURGERY     by dr. Marlou Starks, removal of polyp  . TONSILLECTOMY     HPI:  Marie [name] is a 82 y.o. female with medical history significant for chronic diastolic CHF, COPD with chronic hypoxic respiratory failure, hypertension, Parkinson's disease, GERD, and anxiety, presenting to the emergency department for evaluation of shortness of breath and wheezing.  Upon arrival to the ED, patient is found to be afebrile, saturating adequately on BiPAP, tachypneic with respiratory rate of 38, slightly tachycardic, and hypertensive.  EKG features a sinus tachycardia with rate 105 and PVCs.  Chest x-rays negative for acute cardiopulmonary disease.    Assessment / Plan / Recommendation Clinical Impression   Pt presents with  oral phase deficits consistent with most recent MBS completed in 09/2017.  Pt has prolonged but functional oral phase with no overt s/s of aspiration and no oral residue post swallow with solids or liquids.  Pt does have a tendency to hold her breath during prolonged oral phase; however she was not dyspneic during evaluation despite lengthy med administration followed by consumption of solids on breakfast tray.  Recommend that pt remain on regular textures and thin liquids with intermittent supervision for use of swallowing precautions.  Pt continues to endorse inconsistent compliance  with Parkinson's meds; therefore, SLP provided skilled education regarding importance of taking meds and their role in maintaining the integrity of pt's swallow mechanism.  Pt would also benefit from outpatient ST to minimize decline of speech and swallowing function in the setting of disease progression.    SLP Visit Diagnosis: Dysphagia, oral phase (R13.11)    Aspiration Risk  Mild aspiration risk    Diet Recommendation Regular;Thin liquid   Liquid Administration via: Straw Medication Administration: Whole meds with liquid Supervision: Patient able to self feed Compensations: Minimize environmental distractions Postural Changes: Seated upright at 90 degrees;Remain upright for at least 30 minutes after po intake    Other  Recommendations Oral Care Recommendations: Oral care BID   Follow up Recommendations Outpatient SLP        Swallow Study   General HPI: Marie [name] is a 82 y.o. female with medical history significant for chronic diastolic CHF, COPD with chronic hypoxic respiratory failure, hypertension, Parkinson's disease, GERD, and anxiety, presenting to the emergency department for evaluation of shortness of breath and wheezing.  Upon arrival to the ED, patient is found to be afebrile, saturating adequately on BiPAP, tachypneic with respiratory rate of 38, slightly tachycardic, and hypertensive.  EKG features a sinus tachycardia with rate 105 and PVCs.  Chest x-rays negative for acute cardiopulmonary disease.  Type of Study: Bedside Swallow Evaluation Previous Swallow Assessment: MBS in 09/2017 Diet Prior to this Study: Regular;Thin liquids Temperature Spikes Noted: No Respiratory Status: Room air History of Recent Intubation: No Behavior/Cognition: Alert;Cooperative;Pleasant mood Oral Cavity Assessment: Within Functional Limits Oral Cavity - Dentition: Missing dentition;Adequate natural dentition Vision: Functional for self-feeding Self-Feeding Abilities: Able to feed  self Patient Positioning: Upright in chair Baseline Vocal Quality: Normal Volitional Cough: Strong    Oral/Motor/Sensory Function Overall Oral Motor/Sensory Function: Within functional limits   Ice Chips     Thin Liquid Thin Liquid: Impaired Presentation: Straw Oral Phase Functional Implications: Prolonged oral transit    Nectar Thick     Honey Thick     Puree Puree: Impaired Presentation: Spoon Oral Phase Functional Implications: Prolonged oral transit   Solid   GO   Solid: Impaired Presentation: Self Fed Oral Phase Functional Implications: Prolonged oral transit        Windell Moulding L 12/16/2017,9:25 AM

## 2017-12-16 NOTE — Progress Notes (Signed)
Patient resting comfortably on 2 lpm via nasal canula.  Patient was instructed by respiratory on how to use flutter valve.  Patient performed respiratory exercise as instructed while nurse and RRT was present in room.  No complaints voiced.  Denies pain.  Safety and comfort measures maintained, call bell within reach. Will continue to monitor.

## 2017-12-16 NOTE — Discharge Summary (Signed)
Physician Discharge Summary  Marie [name] LFY:[id number] DOB: 1936-07-17  PCP: [name], MD  Admit date: 12/15/2017 Discharge date: 12/16/2017  Recommendations for Outpatient Follow-up:  1. Dr. [name], PCP in 1 week with repeat labs (CBC & BMP).  Home Health: None Equipment/Devices: None  Discharge Condition: Improved and stable. CODE STATUS: Full Diet recommendation: Heart healthy diet.  Discharge Diagnoses:  Principal Problem:   COPD with acute exacerbation ([place]) Active Problems:   Essential hypertension   Parkinson's plus syndrome (H[facility])   Acute on chronic respiratory failure with hypoxia (H[facility])   Chronic diastolic CHF (congestive heart failure) ([facility])   Anxiety   Pressure injury of skin   Brief Summary: 82 year old female with PMH of chronic diastolic CHF, COPD, chronic respiratory failure on PRN home oxygen 2 L/min, HTN, Parkinson's plus disease, GERD, anxiety presented to the ED due to dyspnea and wheezing. She was recently treated with a course of azithromycin for productive cough and dyspnea couple weeks ago and had improved until PTA.  Also Lasix dose was doubled by PCP due to leg swelling which improved.  Admitted for COPD exacerbation and acute on chronic hypoxic respiratory failure.   Assessment & Plan:   COPD exacerbation: Chest x-ray clear.  No overt features of decompensated CHF.  Presented to ED in extremis.  Received Solu-Medrol 125 mg IV x1 and albuterol nebulization x2 with EMS prior to arrival and was briefly on BiPAP in the ED. Weaned off of BiPAP in ED.    Treated with IV Solu-Medrol, levofloxacin and bronchodilators.  Improved.  Reports that her breathing is back to baseline.  No clinical bronchospasm.  Ambulated with nursing and no hypoxia on room air.  Discharged on oral prednisone taper, complete 5 days of oral antibiotics and close outpatient follow-up with PCP.  She sees Dr. Lake Bells, Pulmonology on annual basis for restrictive lung  disease although during visit in September, she had little evidence of ongoing lung disease except for that associated with her Parkinson's syndrome.  Acute on chronic hypoxic respiratory failure: Secondary to COPD exacerbation.  Management as above.  Hypoxia resolved.  Chronic diastolic CHF: Compensated at this time.  Continue Lasix 40 mg daily and ARB.  Essential hypertension: Controlled.  Parkinson's plus disease: Stable.  Follows with neurology as outpatient.  Oral pharyngeal dysphagia: Likely related to Parkinson's disease.  Speech therapy evaluated and recommend continued regular diet and thin liquids.  GERD: Patient on both H2 blockers and PPI PTA, continue.  Not much symptoms.  Anxiety: Continue prior home dose of benzodiazepines.  Severe protein calorie malnutrition/Body mass index is 20.57 kg/m.  Continue prior nutritional supplements.   Consultants:  None  Procedures BiPAP   Discharge Instructions  Discharge Instructions    (HEART FAILURE PATIENTS) Call MD:  Anytime you have any of the following symptoms: 1) 3 pound weight gain in 24 hours or 5 pounds in 1 week 2) shortness of breath, with or without a dry hacking cough 3) swelling in the hands, feet or stomach 4) if you have to sleep on extra pillows at night in order to breathe.   Complete by:  As directed    Call MD for:  difficulty breathing, headache or visual disturbances   Complete by:  As directed    Call MD for:  extreme fatigue   Complete by:  As directed    Call MD for:  persistant dizziness or light-headedness   Complete by:  As directed    Call MD for:  temperature >  100.4   Complete by:  As directed    Diet - low sodium heart healthy   Complete by:  As directed    Increase activity slowly   Complete by:  As directed        Medication List    TAKE these medications   albuterol (2.5 MG/3ML) 0.083% nebulizer solution Commonly known as:  PROVENTIL Use 1 ampule in nebulizer 4 times a day or  every 4 hours as needed to rescue asthma.   ALPRAZolam 0.5 MG tablet Commonly known as:  XANAX Take 1 tablet 3 x / day for Anxiety & Air Hunger What changed:    how much to take  how to take this  when to take this  additional instructions   aspirin 81 MG tablet Take 81 mg by mouth daily. Buys OTC   budesonide 0.5 MG/2ML nebulizer solution Commonly known as:  PULMICORT Take 2 mLs (0.5 mg total) by nebulization 2 (two) times daily. Dx-J45.909, J44.0, J20.9   carbidopa-levodopa 25-100 MG tablet Commonly known as:  SINEMET IR Take 1.5 tablets by mouth 2 (two) times daily.   doxycycline 100 MG tablet Commonly known as:  VIBRA-TABS Take 1 tablet (100 mg total) by mouth 2 (two) times daily with a meal.   ENSURE Take 237 mLs by mouth daily.   famotidine 20 MG tablet Commonly known as:  PEPCID Take 20 mg by mouth 2 (two) times daily.   formoterol 20 MCG/2ML nebulizer solution Commonly known as:  PERFOROMIST Take 2 mLs (20 mcg total) by nebulization 2 (two) times daily.   furosemide 40 MG tablet Commonly known as:  LASIX Take 1 tablet daily for fluid retention or ankle swelling What changed:    how much to take  how to take this  when to take this  additional instructions   olmesartan 20 MG tablet Commonly known as:  BENICAR Take 1 tablet daily for BP (replaces Losartan) What changed:    how much to take  how to take this  when to take this  additional instructions   omeprazole 40 MG capsule Commonly known as:  PRILOSEC Take 1 capsule daily for Acid Reflux   predniSONE 10 MG tablet Commonly known as:  DELTASONE Take 4 tabs daily for 2 days, then 3 tablets daily for 2 days, then 2 tablets daily for 2 days, then 1 tablet daily for 2 days, then stop. Start taking on:  12/17/2017   PROCTOZONE-HC 2.5 % rectal cream Generic drug:  hydrocortisone Apply 1 application topically as needed for hemorrhoids.   sodium chloride HYPERTONIC 3 % nebulizer  solution Take 1 vial by nebulization BID What changed:    how much to take  how to take this  when to take this  additional instructions   Vitamin D 2000 units Caps Take 1 capsule by mouth daily.      Follow-up Information    [name], MD. Schedule an appointment as soon as possible for a visit in 1 week(s).   Specialty:  Internal Medicine Why:  To be seen with repeat labs (CBC & BMP). Contact information: [id number] [place] [address] [phone]          Allergies  Allergen Reactions  . Ertapenem Hives    Caused whole body to turn red Other reaction(s): Redness Caused whole body to turn red  . Codeine Other (See Comments)    hallucinations  . Demerol Other (See Comments)    hallucinations  . Morphine And Related  Other (See Comments)    hallucinations  . Other Other (See Comments)    Invan 7- pt became red all over  . Sulfate Other (See Comments)    unknown      Procedures/Studies: Dg Chest Port 1 View  Result Date: 12/15/2017 CLINICAL DATA:  82 year old female with shortness of breath and wheezing. EXAM: PORTABLE CHEST 1 VIEW COMPARISON:  Chest radiograph dated 10/20/2017 FINDINGS: Chronic bronchitic changes. There is no focal consolidation, pleural effusion, or pneumothorax. The cardiac silhouette is within normal limits. Atherosclerotic calcification of the aortic arch. No acute osseous pathology. IMPRESSION: No active disease. Electronically Signed   By: Anner Crete M.D.   On: 12/15/2017 02:25      Subjective: Patient states that she is doing well.  Reports that her breathing is back to baseline.  Denies dyspnea, cough, chest pain, fever, chills, dizziness or lightheadedness.  Interviewed and examined with patient's nurse.  As per nursing, no acute issues reported.  Ambulated without oxygen and no hypoxia noted.  Discharge Exam:  Vitals:   12/16/17 0346 12/16/17 0700 12/16/17 0758 12/16/17 1100  BP: (!) 112/45    (!) 108/53  Pulse: 76 80    Resp:  (!) 21    Temp: 98 F (36.7 C)   98.1 F (36.7 C)  TempSrc: Oral   Oral  SpO2: 95% 94% 93%   Weight:      Height:        General exam: Pleasant elderly female, lying comfortably propped up in bed.  Small built and frail. Respiratory system: Clear to auscultation without wheezing, rhonchi or crackles.  Respiratory effort normal. Cardiovascular system: S1 & S2 heard, RRR. No JVD, murmurs, rubs, gallops or clicks. No pedal edema.  Telemetry sinus rhythm.  Gastrointestinal system: Abdomen is nondistended, soft and nontender. No organomegaly or masses felt. Normal bowel sounds heard. Central nervous system: Alert and oriented. No focal neurological deficits. Extremities: Symmetric 5 x 5 power. Skin: No rashes, lesions or ulcers Psychiatry: Judgement and insight appear normal. Mood & affect flat..       The results of significant diagnostics from this hospitalization (including imaging, microbiology, ancillary and laboratory) are listed below for reference.     Microbiology: Recent Results (from the past 240 hour(s))  MRSA PCR Screening     Status: Abnormal   Collection Time: 12/15/17  2:05 PM  Result Value Ref Range Status   MRSA by PCR POSITIVE (A) NEGATIVE Final    Comment:        The GeneXpert MRSA Assay (FDA approved for NASAL specimens only), is one component of a comprehensive MRSA colonization surveillance program. It is not intended to diagnose MRSA infection nor to guide or monitor treatment for MRSA infections. RESULT CALLED TO, READ BACK BY AND VERIFIED WITH: Candie Chroman RN 16:30 12/15/17 ([email or web address])      Labs: CBC: Recent Labs  Lab 12/15/17 0216  WBC 8.7  NEUTROABS 3.8  HGB 14.6  HCT 46.7*  MCV 92.5  PLT 741   Basic Metabolic Panel: Recent Labs  Lab 12/15/17 0216  NA 142  K 4.1  CL 106  CO2 24  GLUCOSE 105*  BUN 18  CREATININE 0.96  CALCIUM 9.0   Liver Function Tests: Recent Labs  Lab 12/15/17 0216  AST  19  ALT 12*  ALKPHOS 48  BILITOT 1.2  PROT 6.0*  ALBUMIN 3.7   BNP (last 3 results) Recent Labs    09/20/17 0727 11/11/17 1628 12/15/17 0216  BNP 153.0*  493* 129.1*   Discussed in detail with patient's spouse.  Updated care and answered questions.    Time coordinating discharge: Over 30 minutes  SIGNED:  Vernell Leep, MD, FACP, [facility]. [facility] Pager [phone]  If 7PM-7AM, please contact night-coverage [email or web address] Password [facility] 12/16/2017, 11:37 AM

## 2017-12-16 NOTE — Discharge Instructions (Signed)
Please get your medications reviewed and adjusted by your Primary MD. ° °Please request your Primary MD to go over all Hospital Tests and Procedure/Radiological results at the follow up, please get all Hospital records sent to your Prim MD by signing hospital release before you go home. ° °If you had Pneumonia of Lung problems at the Hospital: °Please get a 2 view Chest X ray done in 6-8 weeks after hospital discharge or sooner if instructed by your Primary MD. ° °If you have Congestive Heart Failure: °Please call your Cardiologist or Primary MD anytime you have any of the following symptoms:  °1) 3 pound weight gain in 24 hours or 5 pounds in 1 week  °2) shortness of breath, with or without a dry hacking cough  °3) swelling in the hands, feet or stomach  °4) if you have to sleep on extra pillows at night in order to breathe ° °Follow cardiac low salt diet and 1.5 lit/day fluid restriction. ° °If you have diabetes °Accuchecks 4 times/day, Once in AM empty stomach and then before each meal. °Log in all results and show them to your primary doctor at your next visit. °If any glucose reading is under 80 or above 300 call your primary MD immediately. ° °If you have Seizure/Convulsions/Epilepsy: °Please do not drive, operate heavy machinery, participate in activities at heights or participate in high speed sports until you have seen by Primary MD or a Neurologist and advised to do so again. ° °If you had Gastrointestinal Bleeding: °Please ask your Primary MD to check a complete blood count within one week of discharge or at your next visit. Your endoscopic/colonoscopic biopsies that are pending at the time of discharge, will also need to followed by your Primary MD. ° °Get Medicines reviewed and adjusted. °Please take all your medications with you for your next visit with your Primary MD ° °Please request your Primary MD to go over all hospital tests and procedure/radiological results at the follow up, please ask your  Primary MD to get all Hospital records sent to his/her office. ° °If you experience worsening of your admission symptoms, develop shortness of breath, life threatening emergency, suicidal or homicidal thoughts you must seek medical attention immediately by calling 911 or calling your MD immediately  if symptoms less severe. ° °You must read complete instructions/literature along with all the possible adverse reactions/side effects for all the Medicines you take and that have been prescribed to you. Take any new Medicines after you have completely understood and accpet all the possible adverse reactions/side effects.  ° °Do not drive or operate heavy machinery when taking Pain medications.  ° °Do not take more than prescribed Pain, Sleep and Anxiety Medications ° °Special Instructions: If you have smoked or chewed Tobacco  in the last 2 yrs please stop smoking, stop any regular Alcohol  and or any Recreational drug use. ° °Wear Seat belts while driving. ° °Please note °You were cared for by a hospitalist during your hospital stay. If you have any questions about your discharge medications or the care you received while you were in the hospital after you are discharged, you can call the unit and asked to speak with the hospitalist on call if the hospitalist that took care of you is not available. Once you are discharged, your primary care physician will handle any further medical issues. Please note that NO REFILLS for any discharge medications will be authorized once you are discharged, as it is imperative that you   return to your primary care physician (or establish a relationship with a primary care physician if you do not have one) for your aftercare needs so that they can reassess your need for medications and monitor your lab values. ° °You can reach the hospitalist office at phone [phone] or fax [phone] °  °If you do not have a primary care physician, you can call [phone] for a physician  referral. ° ° °Chronic Obstructive Pulmonary Disease Exacerbation °Chronic obstructive pulmonary disease (COPD) is a common lung problem. In COPD, the flow of air from the lungs is limited. COPD exacerbations are times that breathing gets worse and you need extra treatment. Without treatment they can be life threatening. If they happen often, your lungs can become more damaged. If your COPD gets worse, your doctor may treat you with: °· Medicines. °· Oxygen. °· Different ways to clear your airway, such as using a mask. ° °Follow these instructions at home: °· Do not smoke. °· Avoid tobacco smoke and other things that bother your lungs. °· If given, take your antibiotic medicine as told. Finish the medicine even if you start to feel better. °· Only take medicines as told by your doctor. °· Drink enough fluids to keep your pee (urine) clear or pale yellow (unless your doctor has told you not to). °· Use a cool mist machine (vaporizer). °· If you use oxygen or a machine that turns liquid medicine into a mist (nebulizer), continue to use them as told. °· Keep up with shots (vaccinations) as told by your doctor. °· Exercise regularly. °· Eat healthy foods. °· Keep all doctor visits as told. °Get help right away if: °· You are very short of breath and it gets worse. °· You have trouble talking. °· You have bad chest pain. °· You have blood in your spit (sputum). °· You have a fever. °· You keep throwing up (vomiting). °· You feel weak, or you pass out (faint). °· You feel confused. °· You keep getting worse. °This information is not intended to replace advice given to you by your health care provider. Make sure you discuss any questions you have with your health care provider. °Document Released: 09/24/2011 Document Revised: 03/12/2016 Document Reviewed: 06/09/2013 °Elsevier Interactive Patient Education © 2017 [employer] ° °

## 2017-12-16 NOTE — Progress Notes (Signed)
Responded to [facility] consult to provide spiritual and emotional support. Prayed with patient and family, husband at bedside. Listened to patient's reflection of her grandfather, who was a Company secretary. Patient was alert, in good spirit. Patient awaiting possible discharge today.    12/16/17 1100  Clinical Encounter Type  Visited With Patient and family together  Visit Type Initial  Referral From Nurse  Consult/Referral To Chaplain  Spiritual Encounters  Spiritual Needs Prayer  Stress Factors  Patient Stress Factors None identified  Family Stress Factors None identified  Redgie Grayer

## 2017-12-16 NOTE — Progress Notes (Signed)
SATURATION QUALIFICATIONS: (This note is used to comply with regulatory documentation for home oxygen)  Patient Saturations on Room Air at Rest = 94  Patient Saturations on Room Air while Ambulating = 91  Patient Saturations on 2 Liters of oxygen while Ambulating = 95  Please briefly explain why patient needs home oxygen:Patient does not require home o2 at this time.

## 2017-12-16 NOTE — Progress Notes (Signed)
Discharge instructions reviewed with patient and husband. Patient is alert and oriented with VS stable for discharge. Removed IV from right wrist with no issues noted.

## 2017-12-19 NOTE — Progress Notes (Signed)
Hospital follow up  Assessment and Plan: Hospital visit follow up for: COPD exacerbation Hospital discharge meds were reviewed, and reconciled with the patient.   Acute on chronic respiratory failure with hypoxia (HCC) -     BASIC METABOLIC PANEL WITH GFR  Chronic respiratory failure with hypoxia, on home O2 therapy ([place]) Has home O2 to use as needed; hasn't needed since discharge  Discussed using home pulse -oximeter more consistently, avoid O2 use if not needed Discussed dangers of excessive O2 use She  COPD with acute exacerbation (HCC) -     CBC with Differential/Platelet -     BASIC METABOLIC PANEL WITH GFR  Parkinson's plus syndrome ([place]) Stable; continue with sinemet and follow up with neurology as recommended  Anxiety Reports symptoms are stable; uses xanax as needed, but denies having tried it when becoming short of breath. celexa was considered and discussed with patient, but she denies having problems with anxiety related to breathing and declines at this time.   Medication compliance issues Home health med management was ordered, but patient ? refused after 1 visit. Based on interactions with patient and family member, it is anticipated this is needed for medication compliance due to poor comprehension and follow through, or patient may progress to needing SNF. Will continue to discuss medications with patient and family member and encourage compliance.   There are no discontinued medications.  Over 40 minutes of exam, counseling, chart review, and complex, high/moderate level critical decision making was performed this visit.   Future Appointments  Date Time Provider [place]  01/26/2018 11:00 AM Liane Comber, NP [facility] None  03/04/2018 11:30 AM Narda Amber K, DO [facility] None  05/12/2018 11:00 AM [name], MD [facility] None     HPI 82 y.o.female with hx of restrictive lung disease, Parkinson's plus syndrome, anxiety presents accompanied  by her husband for follow up for transition from recent hospitalization. Admit date to the hospital was 12/15/17, patient was discharged from the hospital on 12/16/17 and our clinical staff contacted the office the day after discharge to set up a follow up appointment. The discharge summary, medications, and diagnostic test results were reviewed before meeting with the patient. The patient was admitted for:   ?COPD exacerbation: Presented to ED in distress c/o wheezing and dyspnea without cough. She had recently had a productive cough which was treated by zpak and prednisone and had returned to baseline. She also was recently noted to have pedal edema and lasix was doubled by Dr. Melford Aase which resolved, but patient admitted in ER to not having taken BP meds or lasix for several days. Chest x-ray clear. No overt features of decompensated CHF. She reported she had been using home nebulizer q4 hours for past day without improvement.  Patient was treated by EMS with 125 mg IV Solu-Medrol and albuterol neb x2 prior to arrival, and was briefly placed on BiPAP in ED, an treated with IV Solu-Medrol, levofloxacin and bronchodilators with improvement and quickly returned to baseline - discharged on prednisone taper and 5 day course of oral antibiotics.   She presents today reporting she has done well, has completed prednisone taper, has not needed home O2. She reports she uses 2L as needed (when she feels short of breath), and has pulse oximeter but cannot tell me what it has been running or was prior to ER visit as she has not used it recently. She tells me today she has been using perforomist and albuterol regularly, but has not been taking  budesonide. She denies feeling anxious prior to or while feeling short of breath  This patient has Restrictive Lung Dz and Parkinson's (+) with mild Dementia and limited insight has has numerous ER visits for acute dyspnea compounding her moderately severe Restrictive Lung Disease  when she panics and presents to the ER's. It has been long suspected that there is a large component of poor compliance in medication regimen of her inhalent bronchodilator therapy and husband likewise seems oblivious to her med dosing schedules and of little help in assessing her compliance. Home medication management by RN has been ordered and it is very clear that compliance with medication schedule is poor. She is followed by Dr. [place] Bells (pulmonology) annually.   Acute on chronic hypoxic respiratory failure:Secondary to COPD exacerbation.  Management as above.  Hypoxia resolved upon discharge.   Parkinson's plus disease:Stable.  Follows with neurology as outpatient.  Oral pharyngeal dysphagia: Likely related to Parkinson's disease.  Speech therapy evaluated while hospitalized and recommend continued regular diet and thin liquids.  Severe protein calorie malnutrition: Body mass index is 20.57 kg/m.  Continue prior nutritional supplements.  Home health is not involved r/t this hospitalization, however med mangement has been ordered in the past, though patient reports they only came on 1 occasion.    Images while in the hospital: Dg Chest Port 1 View  Result Date: 12/15/2017 CLINICAL DATA:  82 year old female with shortness of breath and wheezing. EXAM: PORTABLE CHEST 1 VIEW COMPARISON:  Chest radiograph dated 10/20/2017 FINDINGS: Chronic bronchitic changes. There is no focal consolidation, pleural effusion, or pneumothorax. The cardiac silhouette is within normal limits. Atherosclerotic calcification of the aortic arch. No acute osseous pathology. IMPRESSION: No active disease. Electronically Signed   By: Anner Crete M.D.   On: 12/15/2017 02:25    Past Medical History:  Diagnosis Date  . Balance disorder 2008.     Falls a lot.  She can be standing and then leans too far over to one side   . Bulging disc   . Chronic bronchitis ([place])    due to congestion at times, on prednisone and  advair  . COPD (chronic obstructive pulmonary disease) ([place])   . Depression    many years ago  . GERD (gastroesophageal reflux disease)   . Hearing loss    mild  . Hyperlipidemia   . Hypertension   . Iatrogenic adrenal insufficiency ([place])   . Incontinence    not indicated at this visit.  Marland Kitchen LVH (left ventricular hypertrophy) due to hypertensive disease    a. 02/2017: echo showing an EF of 65-70% with moderate LVH, hyperdynamic LV with subvalvular gradient of 70 mmHg, SAM, and mild MR  . Neuropathy    legs stay numb   . Osteoarthritis    hands,   . Osteoporosis   . Shortness of breath dyspnea   . Tubulovillous adenoma of rectum   . Wears dentures   . Wears glasses      Allergies  Allergen Reactions  . Ertapenem Hives    Caused whole body to turn red Other reaction(s): Redness Caused whole body to turn red  . Codeine Other (See Comments)    hallucinations  . Demerol Other (See Comments)    hallucinations  . Morphine And Related Other (See Comments)    hallucinations  . Other Other (See Comments)    Invan 7- pt became red all over  . Sulfate Other (See Comments)    unknown      Current Outpatient  Medications on File Prior to Visit  Medication Sig Dispense Refill  . albuterol (PROVENTIL) (2.5 MG/3ML) 0.083% nebulizer solution Use 1 ampule in nebulizer 4 times a day or every 4 hours as needed to rescue asthma. 360 mL 3  . ALPRAZolam (XANAX) 0.5 MG tablet Take 1 tablet 3 x / day for Anxiety & Air Hunger (Patient taking differently: Take 0.5 mg by mouth 3 (three) times daily. ) 90 tablet 5  . aspirin 81 MG tablet Take 81 mg by mouth daily. Buys OTC    . budesonide (PULMICORT) 0.5 MG/2ML nebulizer solution Take 2 mLs (0.5 mg total) by nebulization 2 (two) times daily. Dx-J45.909, J44.0, J20.9 180 mL 1  . carbidopa-levodopa (SINEMET IR) 25-100 MG tablet Take 1.5 tablets by mouth 2 (two) times daily. 270 tablet 3  . Cholecalciferol (VITAMIN D) 2000 units CAPS Take 1 capsule by  mouth daily.    Marland Kitchen doxycycline (VIBRA-TABS) 100 MG tablet Take 1 tablet (100 mg total) by mouth 2 (two) times daily with a meal. 5 tablet 0  . ENSURE (ENSURE) Take 237 mLs by mouth daily.    . famotidine (PEPCID) 20 MG tablet Take 20 mg by mouth 2 (two) times daily.     . formoterol (PERFOROMIST) 20 MCG/2ML nebulizer solution Take 2 mLs (20 mcg total) by nebulization 2 (two) times daily. 360 mL 2  . furosemide (LASIX) 40 MG tablet Take 1 tablet daily for fluid retention or ankle swelling (Patient taking differently: Take 40 mg by mouth daily. ) 90 tablet 1  . olmesartan (BENICAR) 20 MG tablet Take 1 tablet daily for BP (replaces Losartan) (Patient taking differently: Take 20 mg by mouth daily. ) 90 tablet 1  . omeprazole (PRILOSEC) 40 MG capsule Take 1 capsule daily for Acid Reflux 90 capsule 1  . predniSONE (DELTASONE) 10 MG tablet Take 4 tabs daily for 2 days, then 3 tablets daily for 2 days, then 2 tablets daily for 2 days, then 1 tablet daily for 2 days, then stop. 20 tablet 0  . PROCTOZONE-HC 2.5 % rectal cream Apply 1 application topically as needed for hemorrhoids.     . sodium chloride HYPERTONIC 3 % nebulizer solution Take 1 vial by nebulization BID (Patient taking differently: Take 4 mLs by nebulization 2 (two) times daily. ) 240 mL 11   No current facility-administered medications on file prior to visit.     ROS: all negative except above.   Physical Exam: Filed Weights   12/20/17 1004  Weight: 129 lb (58.5 kg)   BP 138/84   Pulse 72   Temp 97.9 F (36.6 C)   Ht 5\' 3"  (1.6 m)   Wt 129 lb (58.5 kg)   SpO2 98%   BMI 22.85 kg/m  General Appearance: Frail elder, well dressed/good hygiene,  in no apparent distress. Eyes: PERRLA, EOMs, conjunctiva no swelling or erythema Sinuses: No Frontal/maxillary tenderness ENT/Mouth: Ext aud canals clear, TMs without erythema, bulging. No erythema, swelling, or exudate on post pharynx.  Tonsils not swollen or erythematous. Hearing normal.   Neck: Supple, thyroid normal.  Respiratory: Respiratory effort normal, BS equal bilaterally without rales, rhonchi, wheezing or stridor.  Cardio: RRR with stable 3/6 systolic murmur consistent with baseline. Brisk peripheral pulses with scant pedal edema.  Abdomen: Soft, + BS.  Non tender, no guarding, rebound, hernias, masses. Lymphatics: Non tender without lymphadenopathy.  Musculoskeletal: Symmetrical strength, very slow gait with walker.   Skin: Warm, dry without rashes, lesions, ecchymosis.  Neuro: Cranial nerves  intact. Normal muscle tone, no cerebellar symptoms. Sensation intact.  Psych: Awake and oriented X 3, normal affect, Insight and Judgment poor.     Izora Ribas, NP 10:37 AM [facility]

## 2017-12-20 ENCOUNTER — Ambulatory Visit (INDEPENDENT_AMBULATORY_CARE_PROVIDER_SITE_OTHER): Payer: Medicare Other | Admitting: Adult Health

## 2017-12-20 ENCOUNTER — Encounter: Payer: Self-pay | Admitting: Adult Health

## 2017-12-20 VITALS — BP 138/84 | HR 72 | Temp 97.9°F | Ht 63.0 in | Wt 129.0 lb

## 2017-12-20 DIAGNOSIS — J441 Chronic obstructive pulmonary disease with (acute) exacerbation: Secondary | ICD-10-CM

## 2017-12-20 DIAGNOSIS — Z9981 Dependence on supplemental oxygen: Secondary | ICD-10-CM | POA: Diagnosis not present

## 2017-12-20 DIAGNOSIS — G232 Striatonigral degeneration: Secondary | ICD-10-CM

## 2017-12-20 DIAGNOSIS — J9621 Acute and chronic respiratory failure with hypoxia: Secondary | ICD-10-CM

## 2017-12-20 DIAGNOSIS — F419 Anxiety disorder, unspecified: Secondary | ICD-10-CM

## 2017-12-20 DIAGNOSIS — G20C Parkinsonism, unspecified: Secondary | ICD-10-CM

## 2017-12-20 DIAGNOSIS — J9611 Chronic respiratory failure with hypoxia: Secondary | ICD-10-CM

## 2017-12-20 LAB — CBC WITH DIFFERENTIAL/PLATELET
BASOS PCT: 0.2 %
Basophils Absolute: 21 cells/uL (ref 0–200)
EOS ABS: 144 {cells}/uL (ref 15–500)
EOS PCT: 1.4 %
HCT: 43.9 % (ref 35.0–45.0)
HEMOGLOBIN: 14.9 g/dL (ref 11.7–15.5)
Lymphs Abs: 1504 cells/uL (ref 850–3900)
MCH: 29 pg (ref 27.0–33.0)
MCHC: 33.9 g/dL (ref 32.0–36.0)
MCV: 85.6 fL (ref 80.0–100.0)
MONOS PCT: 7.2 %
MPV: 10.5 fL (ref 7.5–12.5)
NEUTROS ABS: 7890 {cells}/uL — AB (ref 1500–7800)
Neutrophils Relative %: 76.6 %
Platelets: 268 10*3/uL (ref 140–400)
RBC: 5.13 10*6/uL — AB (ref 3.80–5.10)
RDW: 12.1 % (ref 11.0–15.0)
Total Lymphocyte: 14.6 %
WBC mixed population: 742 cells/uL (ref 200–950)
WBC: 10.3 10*3/uL (ref 3.8–10.8)

## 2017-12-20 LAB — BASIC METABOLIC PANEL WITH GFR
BUN/Creatinine Ratio: 31 (calc) — ABNORMAL HIGH (ref 6–22)
BUN: 30 mg/dL — AB (ref 7–25)
CALCIUM: 9.1 mg/dL (ref 8.6–10.4)
CHLORIDE: 104 mmol/L (ref 98–110)
CO2: 32 mmol/L (ref 20–32)
CREATININE: 0.97 mg/dL — AB (ref 0.60–0.93)
GFR, Est African American: 64 mL/min/{1.73_m2} (ref >=60)
GFR, Est Non African American: 56 mL/min/{1.73_m2} — ABNORMAL LOW (ref >=60)
GLUCOSE: 84 mg/dL (ref 65–99)
Potassium: 4 mmol/L (ref 3.5–5.3)
Sodium: 143 mmol/L (ref 135–146)

## 2017-12-20 NOTE — Patient Instructions (Addendum)
Start checking oxygen levels with pulse oximeter  Use xanax if you start to feel short of breath  Make sure you are using - pulmicort/budesonide DAILY with perforomist  Albuterol ONLY if feeling short of breath or wheezy   HOME CARE INSTRUCTIONS   Do not stand or sit in one position for long periods of time. Do not sit with your legs crossed. Rest with your legs raised during the day.  Your legs have to be higher than your heart so that gravity will force the valves to open, so please really elevate your legs.   Wear elastic stockings or support hose. Do not wear other tight, encircling garments around the legs, pelvis, or waist.  [employer]  has a wide variety of well priced compression stockings. [place], [place] [address] [address] [place] has a good cheap selection, I like the socks, they are not as hard to get on  Walk as much as possible to increase blood flow.  Raise the foot of your bed at night with 2-inch blocks. SEEK MEDICAL CARE IF:   The skin around your ankle starts to break down.  You have pain, redness, tenderness, or hard swelling developing in your leg over a vein.  You are uncomfortable due to leg pain. Document Released: 07/15/2005 Document Revised: 12/28/2011 Document Reviewed: 12/01/2010 [facility] Patient Information 2014 [place].

## 2018-01-07 ENCOUNTER — Inpatient Hospital Stay (HOSPITAL_COMMUNITY)
Admission: EM | Admit: 2018-01-07 | Discharge: 2018-01-11 | DRG: 291 | Disposition: A | Discharge status: Skilled Nursing Facility | Payer: Medicare Other | Attending: Internal Medicine | Admitting: Internal Medicine

## 2018-01-07 ENCOUNTER — Other Ambulatory Visit: Payer: Self-pay

## 2018-01-07 ENCOUNTER — Encounter (HOSPITAL_COMMUNITY): Payer: Self-pay | Admitting: Emergency Medicine

## 2018-01-07 ENCOUNTER — Emergency Department (HOSPITAL_COMMUNITY): Payer: Medicare Other

## 2018-01-07 DIAGNOSIS — K219 Gastro-esophageal reflux disease without esophagitis: Secondary | ICD-10-CM | POA: Diagnosis present

## 2018-01-07 DIAGNOSIS — F419 Anxiety disorder, unspecified: Secondary | ICD-10-CM | POA: Diagnosis present

## 2018-01-07 DIAGNOSIS — I5033 Acute on chronic diastolic (congestive) heart failure: Secondary | ICD-10-CM | POA: Diagnosis not present

## 2018-01-07 DIAGNOSIS — R062 Wheezing: Secondary | ICD-10-CM | POA: Diagnosis not present

## 2018-01-07 DIAGNOSIS — E873 Alkalosis: Secondary | ICD-10-CM | POA: Diagnosis present

## 2018-01-07 DIAGNOSIS — R1310 Dysphagia, unspecified: Secondary | ICD-10-CM | POA: Diagnosis present

## 2018-01-07 DIAGNOSIS — J9621 Acute and chronic respiratory failure with hypoxia: Secondary | ICD-10-CM | POA: Diagnosis present

## 2018-01-07 DIAGNOSIS — I110 Hypertensive heart disease with heart failure: Principal | ICD-10-CM | POA: Diagnosis present

## 2018-01-07 DIAGNOSIS — J441 Chronic obstructive pulmonary disease with (acute) exacerbation: Secondary | ICD-10-CM

## 2018-01-07 DIAGNOSIS — Z881 Allergy status to other antibiotic agents status: Secondary | ICD-10-CM

## 2018-01-07 DIAGNOSIS — Z79899 Other long term (current) drug therapy: Secondary | ICD-10-CM

## 2018-01-07 DIAGNOSIS — G20C Parkinsonism, unspecified: Secondary | ICD-10-CM | POA: Diagnosis present

## 2018-01-07 DIAGNOSIS — J9611 Chronic respiratory failure with hypoxia: Secondary | ICD-10-CM

## 2018-01-07 DIAGNOSIS — R339 Retention of urine, unspecified: Secondary | ICD-10-CM | POA: Diagnosis present

## 2018-01-07 DIAGNOSIS — M810 Age-related osteoporosis without current pathological fracture: Secondary | ICD-10-CM | POA: Diagnosis present

## 2018-01-07 DIAGNOSIS — E43 Unspecified severe protein-calorie malnutrition: Secondary | ICD-10-CM | POA: Diagnosis present

## 2018-01-07 DIAGNOSIS — Z8639 Personal history of other endocrine, nutritional and metabolic disease: Secondary | ICD-10-CM

## 2018-01-07 DIAGNOSIS — H9190 Unspecified hearing loss, unspecified ear: Secondary | ICD-10-CM | POA: Diagnosis present

## 2018-01-07 DIAGNOSIS — G232 Striatonigral degeneration: Secondary | ICD-10-CM | POA: Diagnosis present

## 2018-01-07 DIAGNOSIS — I10 Essential (primary) hypertension: Secondary | ICD-10-CM | POA: Diagnosis present

## 2018-01-07 DIAGNOSIS — Z6821 Body mass index (BMI) 21.0-21.9, adult: Secondary | ICD-10-CM

## 2018-01-07 DIAGNOSIS — J9601 Acute respiratory failure with hypoxia: Secondary | ICD-10-CM | POA: Diagnosis not present

## 2018-01-07 DIAGNOSIS — R402413 Glasgow coma scale score 13-15, at hospital admission: Secondary | ICD-10-CM | POA: Diagnosis present

## 2018-01-07 DIAGNOSIS — G629 Polyneuropathy, unspecified: Secondary | ICD-10-CM | POA: Diagnosis present

## 2018-01-07 DIAGNOSIS — R069 Unspecified abnormalities of breathing: Secondary | ICD-10-CM | POA: Diagnosis not present

## 2018-01-07 DIAGNOSIS — R7989 Other specified abnormal findings of blood chemistry: Secondary | ICD-10-CM | POA: Diagnosis present

## 2018-01-07 DIAGNOSIS — Z91048 Other nonmedicinal substance allergy status: Secondary | ICD-10-CM

## 2018-01-07 DIAGNOSIS — R5381 Other malaise: Secondary | ICD-10-CM | POA: Diagnosis present

## 2018-01-07 DIAGNOSIS — E876 Hypokalemia: Secondary | ICD-10-CM | POA: Diagnosis present

## 2018-01-07 DIAGNOSIS — Z7951 Long term (current) use of inhaled steroids: Secondary | ICD-10-CM

## 2018-01-07 DIAGNOSIS — Z885 Allergy status to narcotic agent status: Secondary | ICD-10-CM

## 2018-01-07 DIAGNOSIS — M19042 Primary osteoarthritis, left hand: Secondary | ICD-10-CM | POA: Diagnosis present

## 2018-01-07 DIAGNOSIS — Z9981 Dependence on supplemental oxygen: Secondary | ICD-10-CM | POA: Diagnosis not present

## 2018-01-07 DIAGNOSIS — Z8679 Personal history of other diseases of the circulatory system: Secondary | ICD-10-CM

## 2018-01-07 DIAGNOSIS — F4541 Pain disorder exclusively related to psychological factors: Secondary | ICD-10-CM | POA: Diagnosis present

## 2018-01-07 DIAGNOSIS — F411 Generalized anxiety disorder: Secondary | ICD-10-CM | POA: Diagnosis not present

## 2018-01-07 DIAGNOSIS — E559 Vitamin D deficiency, unspecified: Secondary | ICD-10-CM | POA: Diagnosis not present

## 2018-01-07 DIAGNOSIS — I517 Cardiomegaly: Secondary | ICD-10-CM | POA: Diagnosis not present

## 2018-01-07 DIAGNOSIS — I5032 Chronic diastolic (congestive) heart failure: Secondary | ICD-10-CM | POA: Diagnosis not present

## 2018-01-07 DIAGNOSIS — Z7982 Long term (current) use of aspirin: Secondary | ICD-10-CM

## 2018-01-07 DIAGNOSIS — R262 Difficulty in walking, not elsewhere classified: Secondary | ICD-10-CM | POA: Diagnosis not present

## 2018-01-07 DIAGNOSIS — F325 Major depressive disorder, single episode, in full remission: Secondary | ICD-10-CM | POA: Diagnosis present

## 2018-01-07 DIAGNOSIS — R488 Other symbolic dysfunctions: Secondary | ICD-10-CM | POA: Diagnosis not present

## 2018-01-07 DIAGNOSIS — G20 Parkinson's disease: Secondary | ICD-10-CM | POA: Diagnosis present

## 2018-01-07 DIAGNOSIS — Z9181 History of falling: Secondary | ICD-10-CM

## 2018-01-07 DIAGNOSIS — E785 Hyperlipidemia, unspecified: Secondary | ICD-10-CM | POA: Diagnosis present

## 2018-01-07 DIAGNOSIS — F3289 Other specified depressive episodes: Secondary | ICD-10-CM | POA: Diagnosis not present

## 2018-01-07 DIAGNOSIS — R1312 Dysphagia, oropharyngeal phase: Secondary | ICD-10-CM | POA: Diagnosis present

## 2018-01-07 DIAGNOSIS — M19041 Primary osteoarthritis, right hand: Secondary | ICD-10-CM | POA: Diagnosis present

## 2018-01-07 DIAGNOSIS — M6281 Muscle weakness (generalized): Secondary | ICD-10-CM | POA: Diagnosis not present

## 2018-01-07 DIAGNOSIS — Z66 Do not resuscitate: Secondary | ICD-10-CM | POA: Diagnosis present

## 2018-01-07 DIAGNOSIS — R0602 Shortness of breath: Secondary | ICD-10-CM | POA: Diagnosis not present

## 2018-01-07 DIAGNOSIS — R279 Unspecified lack of coordination: Secondary | ICD-10-CM | POA: Diagnosis not present

## 2018-01-07 LAB — COMPREHENSIVE METABOLIC PANEL
ALBUMIN: 3.8 g/dL (ref 3.5–5.0)
ALT: 12 U/L — AB (ref 14–54)
AST: 16 U/L (ref 15–41)
Alkaline Phosphatase: 49 U/L (ref 38–126)
Anion gap: 12 (ref 5–15)
BUN: 21 mg/dL — AB (ref 6–20)
CALCIUM: 9.4 mg/dL (ref 8.9–10.3)
CHLORIDE: 104 mmol/L (ref 101–111)
CO2: 29 mmol/L (ref 22–32)
CREATININE: 1.02 mg/dL — AB (ref 0.44–1.00)
GFR calc non Af Amer: 51 mL/min — ABNORMAL LOW (ref >60)
GFR, EST AFRICAN AMERICAN: 59 mL/min — AB (ref >60)
GLUCOSE: 103 mg/dL — AB (ref 65–99)
Potassium: 3.7 mmol/L (ref 3.5–5.1)
Sodium: 145 mmol/L (ref 135–145)
Total Bilirubin: 1.3 mg/dL — ABNORMAL HIGH (ref 0.3–1.2)
Total Protein: 6.5 g/dL (ref 6.5–8.1)

## 2018-01-07 LAB — CBC WITH DIFFERENTIAL/PLATELET
BASOS ABS: 0.1 10*3/uL (ref 0.0–0.1)
Basophils Relative: 1 %
EOS ABS: 0.8 10*3/uL — AB (ref 0.0–0.7)
Eosinophils Relative: 11 %
HCT: 46.9 % — ABNORMAL HIGH (ref 36.0–46.0)
HEMOGLOBIN: 14.7 g/dL (ref 12.0–15.0)
Lymphocytes Relative: 31 %
Lymphs Abs: 2.1 10*3/uL (ref 0.7–4.0)
MCH: 28.7 pg (ref 26.0–34.0)
MCHC: 31.3 g/dL (ref 30.0–36.0)
MCV: 91.4 fL (ref 78.0–100.0)
Monocytes Absolute: 0.4 10*3/uL (ref 0.1–1.0)
Monocytes Relative: 6 %
NEUTROS PCT: 51 %
Neutro Abs: 3.6 10*3/uL (ref 1.7–7.7)
PLATELETS: 207 10*3/uL (ref 150–400)
RBC: 5.13 MIL/uL — AB (ref 3.87–5.11)
RDW: 14.1 % (ref 11.5–15.5)
WBC: 7 10*3/uL (ref 4.0–10.5)

## 2018-01-07 LAB — BLOOD GAS, ARTERIAL
ACID-BASE EXCESS: 3.9 mmol/L — AB (ref 0.0–2.0)
BICARBONATE: 28 mmol/L (ref 20.0–28.0)
DELIVERY SYSTEMS: POSITIVE
DRAWN BY: 270161
Expiratory PAP: 6
FIO2: 35
Inspiratory PAP: 14
O2 Saturation: 96.9 %
PCO2 ART: 39.5 mmHg (ref 32.0–48.0)
PH ART: 7.459 — AB (ref 7.350–7.450)
Patient temperature: 37
RATE: 8 resp/min
pO2, Arterial: 92.4 mmHg (ref 83.0–108.0)

## 2018-01-07 LAB — BRAIN NATRIURETIC PEPTIDE: B Natriuretic Peptide: 205 pg/mL — ABNORMAL HIGH (ref 0.0–100.0)

## 2018-01-07 LAB — TROPONIN I: Troponin I: <0.03 ng/mL (ref <0.03)

## 2018-01-07 LAB — D-DIMER, QUANTITATIVE: D-Dimer, Quant: 3.27 ug{FEU}/mL — ABNORMAL HIGH (ref 0.00–0.50)

## 2018-01-07 MED ORDER — PANTOPRAZOLE SODIUM 40 MG PO TBEC
40.0000 mg | DELAYED_RELEASE_TABLET | Freq: Every day | ORAL | Status: DC
  Administered 2018-01-07 – 2018-01-11 (×5): 40 mg via ORAL
  Filled 2018-01-07 (×5): qty 1

## 2018-01-07 MED ORDER — VITAMIN D 1000 UNITS PO TABS
2000.0000 [IU] | ORAL_TABLET | Freq: Every day | ORAL | Status: DC
  Administered 2018-01-08 – 2018-01-11 (×4): 2000 [IU] via ORAL
  Filled 2018-01-07 (×4): qty 2

## 2018-01-07 MED ORDER — FAMOTIDINE 20 MG PO TABS
20.0000 mg | ORAL_TABLET | Freq: Two times a day (BID) | ORAL | Status: DC
  Administered 2018-01-07 – 2018-01-11 (×8): 20 mg via ORAL
  Filled 2018-01-07 (×8): qty 1

## 2018-01-07 MED ORDER — TRAMADOL HCL 50 MG PO TABS: 50.0000 mg | ORAL_TABLET | Freq: Four times a day (QID) | ORAL | Status: DC | PRN

## 2018-01-07 MED ORDER — ACETAMINOPHEN 325 MG PO TABS: 650.0000 mg | ORAL_TABLET | Freq: Four times a day (QID) | ORAL | Status: DC | PRN

## 2018-01-07 MED ORDER — SODIUM CHLORIDE 0.9 % IV SOLN
INTRAVENOUS | Status: DC
  Administered 2018-01-07: 17:00:00 via INTRAVENOUS

## 2018-01-07 MED ORDER — FUROSEMIDE 10 MG/ML IJ SOLN
40.0000 mg | Freq: Two times a day (BID) | INTRAMUSCULAR | Status: DC
  Administered 2018-01-07 – 2018-01-09 (×4): 40 mg via INTRAVENOUS
  Filled 2018-01-07 (×5): qty 4

## 2018-01-07 MED ORDER — SODIUM CHLORIDE 0.9 % IV SOLN
100.0000 mg | Freq: Two times a day (BID) | INTRAVENOUS | Status: DC
  Administered 2018-01-07 – 2018-01-09 (×5): 100 mg via INTRAVENOUS
  Filled 2018-01-07 (×8): qty 100

## 2018-01-07 MED ORDER — ACETAMINOPHEN 650 MG RE SUPP: 650.0000 mg | Freq: Four times a day (QID) | RECTAL | Status: DC | PRN

## 2018-01-07 MED ORDER — CARBIDOPA-LEVODOPA 25-100 MG PO TABS
1.5000 | ORAL_TABLET | Freq: Two times a day (BID) | ORAL | Status: DC
  Administered 2018-01-07 – 2018-01-11 (×8): 1.5 via ORAL
  Filled 2018-01-07 (×8): qty 2

## 2018-01-07 MED ORDER — DOXYCYCLINE HYCLATE 100 MG IV SOLR
INTRAVENOUS | Status: AC
  Filled 2018-01-07: qty 100

## 2018-01-07 MED ORDER — ENOXAPARIN SODIUM 40 MG/0.4ML ~~LOC~~ SOLN
40.0000 mg | SUBCUTANEOUS | Status: DC
  Administered 2018-01-07 – 2018-01-10 (×4): 40 mg via SUBCUTANEOUS
  Filled 2018-01-07 (×4): qty 0.4

## 2018-01-07 MED ORDER — IPRATROPIUM-ALBUTEROL 0.5-2.5 (3) MG/3ML IN SOLN
3.0000 mL | RESPIRATORY_TRACT | Status: DC
  Administered 2018-01-07 – 2018-01-09 (×7): 3 mL via RESPIRATORY_TRACT
  Filled 2018-01-07 (×7): qty 3

## 2018-01-07 MED ORDER — IPRATROPIUM-ALBUTEROL 0.5-2.5 (3) MG/3ML IN SOLN
3.0000 mL | Freq: Once | RESPIRATORY_TRACT | Status: AC
  Administered 2018-01-07: 3 mL via RESPIRATORY_TRACT
  Filled 2018-01-07: qty 3

## 2018-01-07 MED ORDER — POLYETHYLENE GLYCOL 3350 17 G PO PACK: 17.0000 g | PACK | Freq: Every day | ORAL | Status: DC | PRN

## 2018-01-07 MED ORDER — ENSURE ENLIVE PO LIQD
237.0000 mL | Freq: Every day | ORAL | Status: DC
  Administered 2018-01-08 – 2018-01-11 (×4): 237 mL via ORAL

## 2018-01-07 MED ORDER — BUDESONIDE 0.5 MG/2ML IN SUSP
0.5000 mg | Freq: Two times a day (BID) | RESPIRATORY_TRACT | Status: DC
  Administered 2018-01-07 – 2018-01-11 (×8): 0.5 mg via RESPIRATORY_TRACT
  Filled 2018-01-07 (×8): qty 2

## 2018-01-07 MED ORDER — ALPRAZOLAM 0.5 MG PO TABS
0.5000 mg | ORAL_TABLET | Freq: Three times a day (TID) | ORAL | Status: DC
  Administered 2018-01-07 – 2018-01-11 (×12): 0.5 mg via ORAL
  Filled 2018-01-07 (×8): qty 1
  Filled 2018-01-07: qty 2
  Filled 2018-01-07 (×3): qty 1

## 2018-01-07 MED ORDER — METHYLPREDNISOLONE SODIUM SUCC 125 MG IJ SOLR
80.0000 mg | Freq: Two times a day (BID) | INTRAMUSCULAR | Status: AC
  Administered 2018-01-07 – 2018-01-08 (×2): 80 mg via INTRAVENOUS
  Filled 2018-01-07 (×2): qty 2

## 2018-01-07 MED ORDER — ONDANSETRON HCL 4 MG/2ML IJ SOLN: 4.0000 mg | Freq: Four times a day (QID) | INTRAMUSCULAR | Status: DC | PRN

## 2018-01-07 MED ORDER — ASPIRIN 81 MG PO CHEW
81.0000 mg | CHEWABLE_TABLET | Freq: Every day | ORAL | Status: DC
  Administered 2018-01-08 – 2018-01-11 (×4): 81 mg via ORAL
  Filled 2018-01-07 (×4): qty 1

## 2018-01-07 MED ORDER — ONDANSETRON HCL 4 MG PO TABS: 4.0000 mg | ORAL_TABLET | Freq: Four times a day (QID) | ORAL | Status: DC | PRN

## 2018-01-07 MED ORDER — IRBESARTAN 150 MG PO TABS
150.0000 mg | ORAL_TABLET | Freq: Every day | ORAL | Status: DC
  Administered 2018-01-07 – 2018-01-11 (×5): 150 mg via ORAL
  Filled 2018-01-07 (×5): qty 1

## 2018-01-07 MED ORDER — ARFORMOTEROL TARTRATE 15 MCG/2ML IN NEBU
15.0000 ug | INHALATION_SOLUTION | Freq: Two times a day (BID) | RESPIRATORY_TRACT | Status: DC
  Administered 2018-01-07 – 2018-01-11 (×8): 15 ug via RESPIRATORY_TRACT
  Filled 2018-01-07 (×8): qty 2

## 2018-01-07 MED ORDER — PREDNISONE 20 MG PO TABS: 40.0000 mg | ORAL_TABLET | Freq: Every day | ORAL | Status: DC

## 2018-01-07 NOTE — ED Notes (Signed)
RT at bedside for bipap.

## 2018-01-07 NOTE — ED Notes (Signed)
EDP at bedside  

## 2018-01-07 NOTE — ED Notes (Signed)
X-ray at bedside

## 2018-01-07 NOTE — H&P (Signed)
History and Physical    Marie [name] JSE:[id number] DOB: 04/20/1936 DOA: 01/07/2018  PCP: [name], MD   Patient coming from: Home   Chief Complaint: Shortness of breath  HPI: Marie [name] is a 82 y.o. female with medical history significant of Gold stage II COPD (PFTs from 08/15/2017 with FEV1 of 69%) on as needed home oxygen, diastolic heart failure with subvalvular aortic gradient due to LVH by echo on 03/19/2017, Parkinson's disease, gated by oropharyngeal dysphagia, hypertension, anxiety, reflux who comes in with respiratory distress.  Patient reports that she was doing well until yesterday evening where she began to have increasing respiratory distress.  She has been wheezing more over the last 2-3 days.  She has required approximately 2 L of her as needed home oxygen and has been using this regularly.  She is also used every 4 hours albuterol via her nebulizer machine without any improvement in her symptoms.  She is also noted some new onset lower extremity edema.  She chronically sleeps upright due to dyspnea.  She has not had any cough, congestion, rhinorrhea, chest pain, orthopnea, paroxysmal nocturnal dyspnea, syncope, fevers, nausea, abdominal pain, vomiting, diarrhea. Family called EMS due to respiratory distress and patient was given nebulizer along with 125 mg of Solu-Medrol IV.  ED Course: In the ED patient was noted to be in acute respiratory distress.  She was placed on BiPAP with improvement in her work of breathing.  She was given inhaled DuoNeb.  Vitals were notable for some hypertension and mild tachypnea.  Labs were notable for a mildly elevated BUN in the 200s.  ABG showed a mild respiratory alkalosis.  Chest x-ray showed no acute process.    Review of Systems: As per HPI otherwise 10 point review of systems negative.    Past Medical History:  Diagnosis Date  . Balance disorder 2008.     Falls a lot.  She can be standing and then leans too far over to one  side   . Bulging disc   . Chronic bronchitis ([place])    due to congestion at times, on prednisone and advair  . COPD (chronic obstructive pulmonary disease) ([place])   . Depression    many years ago  . GERD (gastroesophageal reflux disease)   . Hearing loss    mild  . Hyperlipidemia   . Hypertension   . Iatrogenic adrenal insufficiency ([place])   . Incontinence    not indicated at this visit.  Marland Kitchen LVH (left ventricular hypertrophy) due to hypertensive disease    a. 02/2017: echo showing an EF of 65-70% with moderate LVH, hyperdynamic LV with subvalvular gradient of 70 mmHg, SAM, and mild MR  . Neuropathy    legs stay numb   . Osteoarthritis    hands,   . Osteoporosis   . Shortness of breath dyspnea   . Tubulovillous adenoma of rectum   . Wears dentures   . Wears glasses     Past Surgical History:  Procedure Laterality Date  . ABDOMINAL HYSTERECTOMY    . APPENDECTOMY    . CHONDROPLASTY  09/19/2014   Procedure: CHONDROPLASTY;  Surgeon: Alta Corning, MD;  Location: [place] [facility];  Service: Orthopedics;;  . DILATION AND CURETTAGE OF UTERUS    . EXTERNAL FIXATION LEG  10/25/2012   Procedure: EXTERNAL FIXATION LEG;  Surgeon: Rozanna Box, MD;  Location: [place];  Service: Orthopedics;  Laterality: Right;  . EYE SURGERY     bilateral cataract surgery  and lens implant  . FINGER SURGERY     fusions and debridements for OA  . INCONTINENCE SURGERY     multiple procedures, not cured  . KNEE ARTHROSCOPY WITH LATERAL MENISECTOMY Right 09/19/2014   Procedure: KNEE ARTHROSCOPY WITH LATERAL MENISECTOMY;  Surgeon: Alta Corning, MD;  Location: [place];  Service: Orthopedics;  Laterality: Right;  . KNEE ARTHROSCOPY WITH MEDIAL MENISECTOMY Right 09/19/2014   Procedure: RIGHT KNEE ARTHROSCOPY WITH MEDIAL AND LATERAL MENISECTOMIES. CHONDROPLASTY OF PATELLA-FEMORAL JOINT;  Surgeon: Alta Corning, MD;  Location: [place];  Service: Orthopedics;  Laterality:  Right;  . RECTAL BIOPSY  09/21/2011   Procedure: BIOPSY RECTAL;  Surgeon: Merrie Roof, MD;  Location: [place];  Service: General;  Laterality: N/A;  3-4 cm  . RECTAL SURGERY     by dr. Marlou Starks, removal of polyp  . TONSILLECTOMY       reports that she has never smoked. She has never used smokeless tobacco. She reports that she does not drink alcohol or use drugs.  Allergies  Allergen Reactions  . Ertapenem Hives    Caused whole body to turn red Other reaction(s): Redness Caused whole body to turn red  . Codeine Other (See Comments)    hallucinations  . Demerol Other (See Comments)    hallucinations  . Morphine And Related Other (See Comments)    hallucinations  . Other Other (See Comments)    Invan 7- pt became red all over  . Sulfate Other (See Comments)    unknown    Family History  Problem Relation Age of Onset  . Cancer Sister        pt unaware of what kind  . High blood pressure Mother   . COPD Mother   . Heart attack Father    Prior to Admission medications   Medication Sig Start Date End Date Taking? Authorizing Provider  albuterol (PROVENTIL) (2.5 MG/3ML) 0.083% nebulizer solution Use 1 ampule in nebulizer 4 times a day or every 4 hours as needed to rescue asthma. 12/03/17 12/03/18  [name], MD  ALPRAZolam Duanne Moron) 0.5 MG tablet Take 1 tablet 3 x / day for Anxiety & Air Hunger Patient taking differently: Take 0.5 mg by mouth 3 (three) times daily.  12/09/17   [name], MD  aspirin 81 MG tablet Take 81 mg by mouth daily. Buys OTC    [provider]  budesonide (PULMICORT) 0.5 MG/2ML nebulizer solution Take 2 mLs (0.5 mg total) by nebulization 2 (two) times daily. Dx-J45.909, J44.0, J20.9 05/13/17   [name], MD  carbidopa-levodopa (SINEMET IR) 25-100 MG tablet Take 1.5 tablets by mouth 2 (two) times daily. 10/04/17   Narda Amber K, DO  Cholecalciferol (VITAMIN D) 2000 units CAPS Take 1 capsule by mouth daily.    [provider]    doxycycline (VIBRA-TABS) 100 MG tablet Take 1 tablet (100 mg total) by mouth 2 (two) times daily with a meal. 12/16/17   Hongalgi, Lenis Dickinson, MD  ENSURE (ENSURE) Take 237 mLs by mouth daily.    [provider]  famotidine (PEPCID) 20 MG tablet Take 20 mg by mouth 2 (two) times daily.     [provider]  formoterol (PERFOROMIST) 20 MCG/2ML nebulizer solution Take 2 mLs (20 mcg total) by nebulization 2 (two) times daily. 09/29/17   Liane Comber, NP  furosemide (LASIX) 40 MG tablet Take 1 tablet daily for fluid retention or ankle swelling Patient taking differently: Take  40 mg by mouth daily.  12/09/17 07/09/18  [name], MD  olmesartan (BENICAR) 20 MG tablet Take 1 tablet daily for BP (replaces Losartan) Patient taking differently: Take 20 mg by mouth daily.  09/03/17 04/03/18  [name], MD  omeprazole (PRILOSEC) 40 MG capsule Take 1 capsule daily for Acid Reflux 09/13/17 04/13/18  [name], MD  predniSONE (DELTASONE) 10 MG tablet Take 4 tabs daily for 2 days, then 3 tablets daily for 2 days, then 2 tablets daily for 2 days, then 1 tablet daily for 2 days, then stop. 12/17/17   Hongalgi, Lenis Dickinson, MD  PROCTOZONE-HC 2.5 % rectal cream Apply 1 application topically as needed for hemorrhoids.  08/30/17   [provider]  sodium chloride HYPERTONIC 3 % nebulizer solution Take 1 vial by nebulization BID Patient taking differently: Take 4 mLs by nebulization 2 (two) times daily.  02/24/17   Juanito Doom, MD    Physical Exam: Vitals:   01/07/18 1656 01/07/18 1700 01/07/18 1724 01/07/18 1730  BP: (!) 152/91 (!) 169/85  (!) 152/74  Pulse: (!) 103 (!) 102 98 96  Resp: 18 18 (!) 30 (!) 25  Temp: 97.8 F (36.6 C)     TempSrc: Oral     SpO2: 97%  99% 100%  Weight:      Height:        Constitutional: NAD, calm, comfortable Vitals:   01/07/18 1656 01/07/18 1700 01/07/18 1724 01/07/18 1730  BP: (!) 152/91 (!) 169/85  (!) 152/74  Pulse: (!) 103 (!) 102  98 96  Resp: 18 18 (!) 30 (!) 25  Temp: 97.8 F (36.6 C)     TempSrc: Oral     SpO2: 97%  99% 100%  Weight:      Height:       Eyes: Anicteric sclera ENMT: BiPAP mask in place, good dentition, moist mucous membranes Neck: normal, supple Respiratory: Mild work of breathing, no adventitious lung sounds on exam Cardiovascular: Distant heart sounds, regular rate and rhythm Abdomen: no tenderness, no masses palpated. No hepatosplenomegaly. Bowel sounds positive.  Musculoskeletal: 1+ lower extremity edema to lower shin Skin: no rashes on visible skin Neurologic: Intact, moving all extremities independently, resting tremor noted in bilateral hands, mild cogwheel rigidity Psychiatric: Normal judgment and insight. Alert and oriented x 3. Normal mood.     Labs on Admission: I have personally reviewed following labs and imaging studies  CBC: Recent Labs  Lab 01/07/18 1722  WBC 7.0  NEUTROABS 3.6  HGB 14.7  HCT 46.9*  MCV 91.4  PLT 595   Basic Metabolic Panel: Recent Labs  Lab 01/07/18 1722  NA 145  K 3.7  CL 104  CO2 29  GLUCOSE 103*  BUN 21*  CREATININE 1.02*  CALCIUM 9.4   GFR: Estimated Creatinine Clearance: 38.6 mL/min (A) (by C-G formula based on SCr of 1.02 mg/dL (H)). Liver Function Tests: Recent Labs  Lab 01/07/18 1722  AST 16  ALT 12*  ALKPHOS 49  BILITOT 1.3*  PROT 6.5  ALBUMIN 3.8   No results for input(s): LIPASE, AMYLASE in the last 168 hours. No results for input(s): AMMONIA in the last 168 hours. Coagulation Profile: No results for input(s): INR, PROTIME in the last 168 hours. Cardiac Enzymes: No results for input(s): CKTOTAL, CKMB, CKMBINDEX, TROPONINI in the last 168 hours. BNP (last 3 results) No results for input(s): PROBNP in the last 8760 hours. HbA1C: No results for input(s): HGBA1C in the last 72 hours. CBG:  No results for input(s): GLUCAP in the last 168 hours. Lipid Profile: No results for input(s): CHOL, HDL, LDLCALC, TRIG,  CHOLHDL, LDLDIRECT in the last 72 hours. Thyroid Function Tests: No results for input(s): TSH, T4TOTAL, FREET4, T3FREE, THYROIDAB in the last 72 hours. Anemia Panel: No results for input(s): VITAMINB12, FOLATE, FERRITIN, TIBC, IRON, RETICCTPCT in the last 72 hours. Urine analysis:    Component Value Date/Time   COLORURINE YELLOW 02/17/2017 [place] 02/17/2017 1229   LABSPEC 1.015 02/17/2017 1229   PHURINE 6.5 02/17/2017 1229   GLUCOSEU NEGATIVE 02/17/2017 1229   HGBUR TRACE (A) 02/17/2017 1229   BILIRUBINUR NEGATIVE 02/17/2017 1229   KETONESUR NEGATIVE 02/17/2017 1229   PROTEINUR NEGATIVE 02/17/2017 1229   UROBILINOGEN 0.2 04/16/2015 2310   NITRITE NEGATIVE 02/17/2017 1229   LEUKOCYTESUR NEGATIVE 02/17/2017 1229    Radiological Exams on Admission: Dg Chest Port 1 View  Result Date: 01/07/2018 CLINICAL DATA:  Worsening shortness of breath EXAM: PORTABLE CHEST 1 VIEW COMPARISON:  12/15/2017, 10/20/2016 FINDINGS: Mild chronic bronchitic changes. No consolidation or effusion. Stable cardiomediastinal silhouette with aortic atherosclerosis. No pneumothorax. IMPRESSION: No active disease. Electronically Signed   By: Donavan Foil M.D.   On: 01/07/2018 17:44    EKG: Independently reviewed. Tachycardia, very poor baseline, no apparent ST segment changes, left axis deviation  Assessment/Plan Principal Problem:   COPD with acute exacerbation (HCC) Active Problems:   Hyperlipidemia   Essential hypertension   Vitamin D deficiency   Depression, major, in remission (HCC)   LVH (left ventricular hypertrophy)   Parkinson's plus syndrome (HCC)   Chronic respiratory failure with hypoxia, on home O2 therapy (HCC)   Oropharyngeal dysphagia   Chronic diastolic CHF (congestive heart failure) ([place])   #) COPD with acute exacerbation causing acute hypoxic respiratory failure: In this patient at least the most likely cause would be COPD with reports of wheezing.  However she does  not have any viral prodrome to explain her acute hypoxia.  While she does have some lower extremity edema her chest x-ray does not show any evidence of pulmonary edema.  Her BNP is only very slightly up as is her creatinine.  It does not appeared that she is in an acute heart failure exacerbation either.  This does raise the specter of a possible pulmonary embolism causing her acute hypoxic respiratory failure.  For now at least based on her prior history we will treat her most aggressively for COPD exacerbation and consider some diuresis. -D-dimer pending, will consider CTA PE protocol of elevated -Troponin ordered - IV methylprednisolone 80 mg every 12 times 1 day, then transition to prednisone 40 mg daily -Every 4 hours duo nebs and as needed -Continue BiPAP, admit to stepdown with telemetry -We will start IV doxycycline 100 mg every 12, transition to p.o. -Continue LABA/ICS  #) Chronic diastolic heart failure with sub-valvular aortic gradient due to LVH: Unfortunately fluid management this patient is extremely challenging.  If she really is fluid overloaded and is in diastolic heart failure than she would benefit from diuresis however if she is underfilled than likely she is obstructing further and causing obstructive heart failure.  On the whole feel the patient might benefit from some gentle diuresis as she is quite hypertensive and has lower extremity edema. -IV furosemide 40 mg every 12 -Hold home oral furosemide - N.p.o. on on BiPAP, when tolerating p.o. 2 g sodium restriction, 1200 mL fluid restriction, strict ins and outs, weigh daily  #) Hypertension: -Continue on  ARB -Continue aspirin 81 mg  #) Psych/pain: -Continue alprazolam 0.5 mg 3 times daily  #) Parkinson's disease complicated by oropharyngeal dysphagia: -Continue carbidopa-levodopa 37.5-150 mg twice a day -Continue PPI and H2 blocker  #) Protein calorie malnutrition: -Continue Ensure supplements  Fluids:  Restrict Electrolytes: Monitor and supplement Nutrition: N.p.o. while on BiPAP, 2 g fluid restricted diet when tolerating p.o.  Prophylaxis: Enoxaparin  Disposition: Pending improved respiratory status  DO NOT RESUSCITATE    Cristy Folks MD [facility]   If 7PM-7AM, please contact night-coverage [email or web address] Password [facility]  01/07/2018, 7:20 PM

## 2018-01-07 NOTE — ED Notes (Signed)
EDP at bedside updating patient and family. 

## 2018-01-07 NOTE — ED Triage Notes (Addendum)
Pt c/o worsening SOB that began today. No relief with neb tx at home. Pt received 2 neb tx and 125 solu medrol PTA. Pt wears oxygen PRN at home. Edema to lower extremities.

## 2018-01-07 NOTE — ED Provider Notes (Signed)
[facility] EMERGENCY DEPARTMENT Provider Note   CSN: [id number] Arrival date & time: 01/07/18  1642     History   Chief Complaint Chief Complaint  Patient presents with  . Shortness of Breath    HPI Marie [name] is a 82 y.o. female.  Patient brought in by EMS.  Patient with a known history of COPD.  Patient clearly with worsening shortness of breath today was in some respiratory distress when EMS got there.  Patient was using nebulizer treatments at home.  She has oxygen available at home but does not normally use it on a regular basis.  She received 125 mg of Solu-Medrol and 2 nebulizer treatments prior to arrival.  Patient also noted that she has some swelling of her lower legs.  Patient denies a history of CHF.     Past Medical History:  Diagnosis Date  . Balance disorder 2008.     Falls a lot.  She can be standing and then leans too far over to one side   . Bulging disc   . Chronic bronchitis ([place])    due to congestion at times, on prednisone and advair  . COPD (chronic obstructive pulmonary disease) ([place])   . Depression    many years ago  . GERD (gastroesophageal reflux disease)   . Hearing loss    mild  . Hyperlipidemia   . Hypertension   . Iatrogenic adrenal insufficiency ([place])   . Incontinence    not indicated at this visit.  Marland Kitchen LVH (left ventricular hypertrophy) due to hypertensive disease    a. 02/2017: echo showing an EF of 65-70% with moderate LVH, hyperdynamic LV with subvalvular gradient of 70 mmHg, SAM, and mild MR  . Neuropathy    legs stay numb   . Osteoarthritis    hands,   . Osteoporosis   . Shortness of breath dyspnea   . Tubulovillous adenoma of rectum   . Wears dentures   . Wears glasses     Patient Active Problem List   Diagnosis Date Noted  . COPD with acute exacerbation ([place]) 12/15/2017  . Acute on chronic respiratory failure with hypoxia ([place]) 12/15/2017  . Chronic diastolic CHF (congestive heart failure) ([place]) 12/15/2017  .  Anxiety 12/15/2017  . Subvalvular aortic stenosis determined by imaging 11/15/2017  . Pedal edema 11/15/2017  . Problem with medical care compliance 11/11/2017  . Aortic atherosclerosis ([place]) 10/19/2017  . Oropharyngeal dysphagia 09/28/2017  . Chronic respiratory failure with hypoxia, on home O2 therapy ([place]) 09/20/2017  . Hypertensive heart disease 03/29/2017  . Protein-calorie malnutrition, severe 03/17/2017  . Parkinson's plus syndrome ([place]) 03/04/2017  . Restrictive lung disease 02/24/2017  . LVH (left ventricular hypertrophy) 01/07/2017  . Depression, major, in remission ([place]) 09/22/2016  . At high risk for falls 10/16/2015  . Prediabetes 08/23/2014  . Vitamin D deficiency 08/23/2014  . Medication management 08/23/2014  . Unstable Gait 01/30/2014  . Incontinence   . Hyperlipidemia   . GERD (gastroesophageal reflux disease)   . Hereditary and idiopathic peripheral neuropathy   . Essential hypertension   . Tubulovillous adenoma of rectum 09/04/2011    Past Surgical History:  Procedure Laterality Date  . ABDOMINAL HYSTERECTOMY    . APPENDECTOMY    . CHONDROPLASTY  09/19/2014   Procedure: CHONDROPLASTY;  Surgeon: Alta Corning, MD;  Location: [place];  Service: Orthopedics;;  . DILATION AND CURETTAGE OF UTERUS    . EXTERNAL FIXATION LEG  10/25/2012   Procedure: EXTERNAL  FIXATION LEG;  Surgeon: Rozanna Box, MD;  Location: [place];  Service: Orthopedics;  Laterality: Right;  . EYE SURGERY     bilateral cataract surgery and lens implant  . FINGER SURGERY     fusions and debridements for OA  . INCONTINENCE SURGERY     multiple procedures, not cured  . KNEE ARTHROSCOPY WITH LATERAL MENISECTOMY Right 09/19/2014   Procedure: KNEE ARTHROSCOPY WITH LATERAL MENISECTOMY;  Surgeon: Alta Corning, MD;  Location: [place];  Service: Orthopedics;  Laterality: Right;  . KNEE ARTHROSCOPY WITH MEDIAL MENISECTOMY Right 09/19/2014   Procedure: RIGHT KNEE  ARTHROSCOPY WITH MEDIAL AND LATERAL MENISECTOMIES. CHONDROPLASTY OF PATELLA-FEMORAL JOINT;  Surgeon: Alta Corning, MD;  Location: [place];  Service: Orthopedics;  Laterality: Right;  . RECTAL BIOPSY  09/21/2011   Procedure: BIOPSY RECTAL;  Surgeon: Merrie Roof, MD;  Location: [place];  Service: General;  Laterality: N/A;  3-4 cm  . RECTAL SURGERY     by dr. Marlou Starks, removal of polyp  . TONSILLECTOMY       OB History   None      Home Medications    Prior to Admission medications   Medication Sig Start Date End Date Taking? Authorizing Provider  albuterol (PROVENTIL) (2.5 MG/3ML) 0.083% nebulizer solution Use 1 ampule in nebulizer 4 times a day or every 4 hours as needed to rescue asthma. 12/03/17 12/03/18  [name], MD  ALPRAZolam Duanne Moron) 0.5 MG tablet Take 1 tablet 3 x / day for Anxiety & Air Hunger Patient taking differently: Take 0.5 mg by mouth 3 (three) times daily.  12/09/17   [name], MD  aspirin 81 MG tablet Take 81 mg by mouth daily. Buys OTC    [provider]  budesonide (PULMICORT) 0.5 MG/2ML nebulizer solution Take 2 mLs (0.5 mg total) by nebulization 2 (two) times daily. Dx-J45.909, J44.0, J20.9 05/13/17   [name], MD  carbidopa-levodopa (SINEMET IR) 25-100 MG tablet Take 1.5 tablets by mouth 2 (two) times daily. 10/04/17   Narda Amber K, DO  Cholecalciferol (VITAMIN D) 2000 units CAPS Take 1 capsule by mouth daily.    [provider]  doxycycline (VIBRA-TABS) 100 MG tablet Take 1 tablet (100 mg total) by mouth 2 (two) times daily with a meal. 12/16/17   Hongalgi, Lenis Dickinson, MD  ENSURE (ENSURE) Take 237 mLs by mouth daily.    [provider]  famotidine (PEPCID) 20 MG tablet Take 20 mg by mouth 2 (two) times daily.     [provider]  formoterol (PERFOROMIST) 20 MCG/2ML nebulizer solution Take 2 mLs (20 mcg total) by nebulization 2 (two) times daily. 09/29/17   Liane Comber, NP  furosemide (LASIX)  40 MG tablet Take 1 tablet daily for fluid retention or ankle swelling Patient taking differently: Take 40 mg by mouth daily.  12/09/17 07/09/18  [name], MD  olmesartan (BENICAR) 20 MG tablet Take 1 tablet daily for BP (replaces Losartan) Patient taking differently: Take 20 mg by mouth daily.  09/03/17 04/03/18  [name], MD  omeprazole (PRILOSEC) 40 MG capsule Take 1 capsule daily for Acid Reflux 09/13/17 04/13/18  [name], MD  predniSONE (DELTASONE) 10 MG tablet Take 4 tabs daily for 2 days, then 3 tablets daily for 2 days, then 2 tablets daily for 2 days, then 1 tablet daily for 2 days, then stop. 12/17/17   Hongalgi, Lenis Dickinson, MD  PROCTOZONE-HC 2.5 % rectal cream Apply 1  application topically as needed for hemorrhoids.  08/30/17   [provider]  sodium chloride HYPERTONIC 3 % nebulizer solution Take 1 vial by nebulization BID Patient taking differently: Take 4 mLs by nebulization 2 (two) times daily.  02/24/17   Juanito Doom, MD    Family History Family History  Problem Relation Age of Onset  . Cancer Sister        pt unaware of what kind  . High blood pressure Mother   . COPD Mother   . Heart attack Father     Social History Social History   Tobacco Use  . Smoking status: Never Smoker  . Smokeless tobacco: Never Used  Substance Use Topics  . Alcohol use: No  . Drug use: No     Allergies   Ertapenem; Codeine; Demerol; Morphine and related; Other; and Sulfate   Review of Systems Review of Systems  Unable to perform ROS: Severe respiratory distress     Physical Exam Updated Vital Signs BP (!) 152/74   Pulse 96   Temp 97.8 F (36.6 C) (Oral)   Resp (!) 25   Ht 1.626 m (5\' 4" )   Wt 59.9 kg (132 lb)   SpO2 100%   BMI 22.66 kg/m   Physical Exam  Constitutional: She appears well-developed and well-nourished. She appears distressed.  HENT:  Head: Normocephalic and atraumatic.  Mucous membranes dry.  Eyes: Pupils are equal,  round, and reactive to light. Conjunctivae and EOM are normal.  Neck: Normal range of motion. Neck supple.  Cardiovascular: Normal rate, regular rhythm and normal heart sounds.  Pulmonary/Chest: She is in respiratory distress. She has no wheezes.  Moving air very poorly, tachypneic, in respiratory distress.  Abdominal: Soft. Bowel sounds are normal. There is no tenderness.  Musculoskeletal: She exhibits edema.  Neurological: She is alert. No cranial nerve deficit or sensory deficit. She exhibits normal muscle tone. Coordination normal.  Skin: Skin is warm.  Nursing note and vitals reviewed.    ED Treatments / Results  Labs (all labs ordered are listed, but only abnormal results are displayed) Labs Reviewed  CBC WITH DIFFERENTIAL/PLATELET - Abnormal; Notable for the following components:      Result Value   RBC 5.13 (*)    HCT 46.9 (*)    Eosinophils Absolute 0.8 (*)    All other components within normal limits  COMPREHENSIVE METABOLIC PANEL - Abnormal; Notable for the following components:   Glucose, Bld 103 (*)    BUN 21 (*)    Creatinine, Ser 1.02 (*)    ALT 12 (*)    Total Bilirubin 1.3 (*)    GFR calc non Af Amer 51 (*)    GFR calc Af Amer 59 (*)    All other components within normal limits  BRAIN NATRIURETIC PEPTIDE - Abnormal; Notable for the following components:   B Natriuretic Peptide 205.0 (*)    All other components within normal limits  BLOOD GAS, ARTERIAL - Abnormal; Notable for the following components:   pH, Arterial 7.459 (*)    Acid-Base Excess 3.9 (*)    All other components within normal limits  RAPID URINE DRUG SCREEN, HOSP PERFORMED    EKG EKG Interpretation  Date/Time:  Friday January 07 2018 17:20:21 EDT Ventricular Rate:  99 PR Interval:    QRS Duration: 93 QT Interval:  395 QTC Calculation: 507 R Axis:   -43 Text Interpretation:  Sinus rhythm Left axis deviation Repol abnrm suggests ischemia, anterolateral Prolonged QT interval  Confirmed by  Fredia Sorrow [phone]) on 01/07/2018 5:38:42 PM   Radiology Dg Chest Port 1 View  Result Date: 01/07/2018 CLINICAL DATA:  Worsening shortness of breath EXAM: PORTABLE CHEST 1 VIEW COMPARISON:  12/15/2017, 10/20/2016 FINDINGS: Mild chronic bronchitic changes. No consolidation or effusion. Stable cardiomediastinal silhouette with aortic atherosclerosis. No pneumothorax. IMPRESSION: No active disease. Electronically Signed   By: Donavan Foil M.D.   On: 01/07/2018 17:44    Procedures Procedures (including critical care time) CRITICAL CARE Performed by: Fredia Sorrow Total critical care time: 45 minutes Critical care time was exclusive of separately billable procedures and treating other patients. Critical care was necessary to treat or prevent imminent or life-threatening deterioration. Critical care was time spent personally by me on the following activities: development of treatment plan with patient and/or surrogate as well as nursing, discussions with consultants, evaluation of patient's response to treatment, examination of patient, obtaining history from patient or surrogate, ordering and performing treatments and interventions, ordering and review of laboratory studies, ordering and review of radiographic studies, pulse oximetry and re-evaluation of patient's condition.   Medications Ordered in ED Medications  0.9 %  sodium chloride infusion ( Intravenous New Bag/Given 01/07/18 1724)  ipratropium-albuterol (DUONEB) 0.5-2.5 (3) MG/3ML nebulizer solution 3 mL (3 mLs Nebulization Given 01/07/18 1724)     Initial Impression / Assessment and Plan / ED Course  I have reviewed the triage vital signs and the nursing notes.  Pertinent labs & imaging results that were available during my care of the patient were reviewed by me and considered in my medical decision making (see chart for details).     Patient clearly arrived in respiratory distress.  Had to be placed on BiPAP immediately.   Patient was having difficulty talking in complete sentences.  Along with her BiPAP she was given a third treatment of albuterol Atrovent nebulizers.  On the BiPAP and with the treatment she slowly improved.  Patient also had already received Solu-Medrol prior to arrival.  Chest x-ray negative for pneumonia or pulmonary edema.  So felt this is mostly an exacerbation of COPD.  However she does have bilateral leg swelling and her BNP was up a little bit we do not have any comparison.  Discussed with hospitalist they will admit.  Final Clinical Impressions(s) / ED Diagnoses   Final diagnoses:  COPD exacerbation ([place])  Acute respiratory failure with hypoxia [facility])    ED Discharge Orders    None       Fredia Sorrow, MD 01/07/18 1921

## 2018-01-08 DIAGNOSIS — G232 Striatonigral degeneration: Secondary | ICD-10-CM

## 2018-01-08 DIAGNOSIS — J9611 Chronic respiratory failure with hypoxia: Secondary | ICD-10-CM

## 2018-01-08 DIAGNOSIS — J441 Chronic obstructive pulmonary disease with (acute) exacerbation: Secondary | ICD-10-CM

## 2018-01-08 DIAGNOSIS — J9601 Acute respiratory failure with hypoxia: Secondary | ICD-10-CM

## 2018-01-08 DIAGNOSIS — I10 Essential (primary) hypertension: Secondary | ICD-10-CM

## 2018-01-08 DIAGNOSIS — I5033 Acute on chronic diastolic (congestive) heart failure: Secondary | ICD-10-CM

## 2018-01-08 DIAGNOSIS — Z9981 Dependence on supplemental oxygen: Secondary | ICD-10-CM

## 2018-01-08 LAB — BASIC METABOLIC PANEL
Anion gap: 13 (ref 5–15)
Calcium: 8.5 mg/dL — ABNORMAL LOW (ref 8.9–10.3)
GFR calc non Af Amer: 52 mL/min — ABNORMAL LOW (ref >60)
Glucose, Bld: 184 mg/dL — ABNORMAL HIGH (ref 65–99)
Potassium: 3.4 mmol/L — ABNORMAL LOW (ref 3.5–5.1)
Sodium: 144 mmol/L (ref 135–145)

## 2018-01-08 LAB — CBC
HCT: 46.2 % — ABNORMAL HIGH (ref 36.0–46.0)
Hemoglobin: 14.4 g/dL (ref 12.0–15.0)
MCH: 28.5 pg (ref 26.0–34.0)
MCHC: 31.2 g/dL (ref 30.0–36.0)
MCV: 91.3 fL (ref 78.0–100.0)
Platelets: 224 K/uL (ref 150–400)
RBC: 5.06 MIL/uL (ref 3.87–5.11)
RDW: 13.6 % (ref 11.5–15.5)
WBC: 5.3 10*3/uL (ref 4.0–10.5)

## 2018-01-08 LAB — BASIC METABOLIC PANEL WITH GFR
BUN: 22 mg/dL — ABNORMAL HIGH (ref 6–20)
CO2: 26 mmol/L (ref 22–32)
Chloride: 105 mmol/L (ref 101–111)
Creatinine, Ser: 1.01 mg/dL — ABNORMAL HIGH (ref 0.44–1.00)
GFR calc Af Amer: 60 mL/min — ABNORMAL LOW (ref >60)

## 2018-01-08 MED ORDER — ORAL CARE MOUTH RINSE
15.0000 mL | Freq: Two times a day (BID) | OROMUCOSAL | Status: DC
  Administered 2018-01-08 – 2018-01-11 (×5): 15 mL via OROMUCOSAL

## 2018-01-08 NOTE — Progress Notes (Signed)
Initial Nutrition Assessment  DOCUMENTATION CODES:  Not applicable  INTERVENTION:  Ensure Enlive po BID, each supplement provides 350 kcal and 20 grams of protein  Will follow meal intake and follow up as needed.   NUTRITION DIAGNOSIS:  Increased nutrient needs related to wound healing, catabolic illness(End stage COPD) as evidenced by estimated nutritional requirements for these conditions   GOAL:  Patient will meet greater than or equal to 90% of their needs  MONITOR:  PO intake, Supplement acceptance, Diet advancement, Labs, Weight trends, Skin, I & O's  REASON FOR ASSESSMENT:  Consult COPD Protocol  ASSESSMENT:  82 y/o female PMHx COPD, HF, Parkinsons w/ oropharyngeal dysphagia, HTN/HLD, Severe PCM,  Anxiety, Depreesion, reflux. Presents with 1 day of increased respiratory stress and wheezing x2-3 days. Developed new onset lower extremity edema. Admitted for COPD exacerbation.  RD operating remotely on weekends. Per chart review, there is no mention of any reduced appetite or difficulty eating recently. Her MST score was 0. Only reported symptom was SOB and lower extremity edema.   Pt was seen by this RD 3 months ago. At that time she was diagnosed with severe malnutrition due to minimal PO intake and significant weight loss. She was 125 lbs at that time. She had been given diet education on weight gain/maintenance. THIS admission, she presented at just over 125 lbs, indicating a stable wt across past 3 months, however, she is currently noted to have lower extremity edema which was not present 3 months ago.   Given increased energy/protein requirements, will order supplements and follow meal intake.   Physical Exam: Unable to assess  Labs: K: 3.4, Bgs: 103-184 Meds: Ensure, H2RA, Vit D, ppi, Lasix  Recent Labs  Lab 01/07/18 1722 01/08/18 0428  NA 145 144  K 3.7 3.4*  CL 104 105  CO2 29 26  BUN 21* 22*  CREATININE 1.02* 1.01*  CALCIUM 9.4 8.5*  GLUCOSE 103* 184*    NUTRITION - FOCUSED PHYSICAL EXAM: Unable to assess  Diet Order:  Diet 2 gram sodium Room service appropriate? Yes; Fluid consistency: Thin; Fluid restriction: 1200 mL Fluid Diet NPO time specified Except for: Sips with Meds  EDUCATION NEEDS:  No education needs have been identified at this time  Skin: PU stage II to R buttocks  Last BM:  3/22  Height:  Ht Readings from Last 1 Encounters:  01/07/18 5\' 4"  (1.626 m)   Weight:  Wt Readings from Last 1 Encounters:  01/08/18 127 lb 3.3 oz (57.7 kg)   Wt Readings from Last 10 Encounters:  01/08/18 127 lb 3.3 oz (57.7 kg)  12/20/17 129 lb (58.5 kg)  12/16/17 121 lb 11.1 oz (55.2 kg)  12/09/17 125 lb 9.6 oz (57 kg)  11/18/17 122 lb (55.3 kg)  11/15/17 122 lb (55.3 kg)  11/11/17 131 lb (59.4 kg)  10/28/17 133 lb (60.3 kg)  10/20/17 124 lb (56.2 kg)  10/04/17 113 lb (51.3 kg)   Ideal Body Weight:  54.54 kg  BMI:  Body mass index is 21.83 kg/m.  Estimated Nutritional Needs:  Kcal:  1700-1900 kcals (30-33 kcal/kg bw) Protein:  80-92g Pro (1.4-1.6 g/kg bw Fluid:  >1.4 L fluid (25 ml/kg bw)  Burtis Junes RD, LDN, CNSC Clinical Nutrition Pager: [phone] 01/08/2018 8:47 AM

## 2018-01-08 NOTE — Progress Notes (Signed)
Patient off BIPAP , appears better. No wheezing noted with breath sounds . PLACED ON 3 LPM Lance Muss

## 2018-01-08 NOTE — Progress Notes (Signed)
[facility] PROGRESS NOTE  Marie [name] ZOX:[id number] DOB: 03/20/1936 DOA: 01/07/2018 PCP: [name], MD  Interim summary history of present: 82 y.o. female with medical history significant of Gold stage II COPD (PFTs from 08/15/2017 with FEV1 of 69%) on as needed home oxygen, diastolic heart failure with subvalvular aortic gradient due to LVH by echo on 03/19/2017, Parkinson's disease, gated by oropharyngeal dysphagia, hypertension, anxiety, reflux who comes in with respiratory distress.  Patient reports that she was doing well until yesterday evening where she began to have increasing respiratory distress.  She has been wheezing more over the last 2-3 days.  She has required approximately 2 L of her as needed home oxygen and has been using this regularly.  She is also used every 4 hours albuterol via her nebulizer machine without any improvement in her symptoms.  She is also noted some new onset lower extremity edema.  She chronically sleeps upright due to dyspnea.  She has not had any cough, congestion, rhinorrhea, chest pain, orthopnea, paroxysmal nocturnal dyspnea, syncope, fevers, nausea, abdominal pain, vomiting, diarrhea. Family called EMS due to respiratory distress and patient was given nebulizer along with 125 mg of Solu-Medrol IV  Assessment/Plan: 1-acute on chronic respiratory failure: In the setting of COPD exacerbation and CHF exacerbation. -continue steroids, pulmicort, duoneb and start flutter valve -will continue doxycycline and slowly wean oxygen as tolerated -From the CHF exacerbation; patient with acute on chronic diastolic heart failure.  We will continue daily weights and strict intake and output -Continue current dose of Lasix; anticipate transition to PO lasix tomorrow. -Low-sodium diet has been discussed with patient. -no BIPAP needed currently  2-hypertension -Blood pressure is a stable and well-controlled at this moment -Continue ARB and  Lasix  3-Anxiety -Continue alprazolam as needed -Currently stable  4-Parkinson's disease -Continue carbidopa-levodopa  5-GERD -Continue PPI and H2 blocker.  6-severe protein calorie malnutrition -Appreciate nutritional service recommendation -Continue feeding supplements  Code Status: DNR Family Communication: Husband at bedside Disposition Plan: Patient will be transferred to telemetry bed, continue current treatment for COPD and CHF exacerbation; follow clinical response.   Consultants:  None  Procedures:  See below for x-ray reports.  Antibiotics:  Doxycycline 3/22  HPI/Subjective: Afebrile, no nausea, no vomiting, no chest pain.  Reports that her breathing has improved.  Patient over 10 hours without the use of BiPAP.  Objective: Vitals:   01/08/18 1500 01/08/18 1642  BP:    Pulse: 89   Resp: 17   Temp:    SpO2: 97% 98%    Intake/Output Summary (Last 24 hours) at 01/08/2018 1650 Last data filed at 01/08/2018 1443 Gross per 24 hour  Intake 250 ml  Output 1400 ml  Net -1150 ml   Filed Weights   01/07/18 1644 01/07/18 2159 01/08/18 0500  Weight: 59.9 kg (132 lb) 56.9 kg (125 lb 7.1 oz) 57.7 kg (127 lb 3.3 oz)    Exam:   General: Afebrile, no chest pain, no nausea, no vomiting.  Patient reports improvement in her breathing and has pain over 10 hours of BiPAP by now.  Still requiring oxygen supplementation and expressed having some wheezing.  Frail, chronically ill in appearance and underweight on exam.  Cardiovascular: Positive systolic ejection murmur, loud S2, no rubs, no gallops, no JVD.  Respiratory: Positive expiratory wheezing, decreased breath sounds at the bases, no using accessory muscles, positive scattered rhonchi.  Abdomen: Soft, nontender, nondistended, positive bowel sounds  Musculoskeletal: No edema, no cyanosis, no clubbing.  Data Reviewed:  Basic Metabolic Panel: Recent Labs  Lab 01/07/18 1722 01/08/18 0428  NA 145 144  K 3.7  3.4*  CL 104 105  CO2 29 26  GLUCOSE 103* 184*  BUN 21* 22*  CREATININE 1.02* 1.01*  CALCIUM 9.4 8.5*   Liver Function Tests: Recent Labs  Lab 01/07/18 1722  AST 16  ALT 12*  ALKPHOS 49  BILITOT 1.3*  PROT 6.5  ALBUMIN 3.8   CBC: Recent Labs  Lab 01/07/18 1722 01/08/18 0428  WBC 7.0 5.3  NEUTROABS 3.6  --   HGB 14.7 14.4  HCT 46.9* 46.2*  MCV 91.4 91.3  PLT 207 224   Cardiac Enzymes: Recent Labs  Lab 01/07/18 1755  TROPONINI <0.03   BNP (last 3 results) Recent Labs    11/11/17 1628 12/15/17 0216 01/07/18 1722  BNP 493* 129.1* 205.0*    Studies: Dg Chest Port 1 View  Result Date: 01/07/2018 CLINICAL DATA:  Worsening shortness of breath EXAM: PORTABLE CHEST 1 VIEW COMPARISON:  12/15/2017, 10/20/2016 FINDINGS: Mild chronic bronchitic changes. No consolidation or effusion. Stable cardiomediastinal silhouette with aortic atherosclerosis. No pneumothorax. IMPRESSION: No active disease. Electronically Signed   By: Donavan Foil M.D.   On: 01/07/2018 17:44    Scheduled Meds: . ALPRAZolam  0.5 mg Oral TID  . arformoterol  15 mcg Nebulization Q12H  . aspirin  81 mg Oral Daily  . budesonide  0.5 mg Nebulization BID  . carbidopa-levodopa  1.5 tablet Oral BID  . cholecalciferol  2,000 Units Oral Daily  . enoxaparin (LOVENOX) injection  40 mg Subcutaneous Q24H  . famotidine  20 mg Oral BID  . feeding supplement (ENSURE ENLIVE)  237 mL Oral Daily  . furosemide  40 mg Intravenous BID  . ipratropium-albuterol  3 mL Nebulization Q4H  . irbesartan  150 mg Oral Daily  . mouth rinse  15 mL Mouth Rinse BID  . pantoprazole  40 mg Oral Daily   Continuous Infusions: . doxycycline (VIBRAMYCIN) IV Stopped (01/08/18 1212)    Principal Problem:   COPD with acute exacerbation ([facility]) Active Problems:   Hyperlipidemia   Essential hypertension   Vitamin D deficiency   Depression, major, in remission ([place])   LVH (left ventricular hypertrophy)   Parkinson's plus syndrome  ([facility])   Chronic respiratory failure with hypoxia, on home O2 therapy ([facility])   Oropharyngeal dysphagia   Chronic diastolic CHF (congestive heart failure) ([place])    Time spent: 25 minutes    [facility] Pager [phone]. If 7PM-7AM, please contact night-coverage at [email or web address], password [facility] 01/08/2018, 4:50 PM  LOS: 1 day

## 2018-01-09 LAB — BASIC METABOLIC PANEL
Anion gap: 9 (ref 5–15)
BUN: 34 mg/dL — ABNORMAL HIGH (ref 6–20)
CALCIUM: 8.5 mg/dL — AB (ref 8.9–10.3)
CO2: 29 mmol/L (ref 22–32)
Chloride: 103 mmol/L (ref 101–111)
Creatinine, Ser: 0.97 mg/dL (ref 0.44–1.00)
GFR calc non Af Amer: 54 mL/min — ABNORMAL LOW (ref >60)
GLUCOSE: 138 mg/dL — AB (ref 65–99)
Potassium: 3.1 mmol/L — ABNORMAL LOW (ref 3.5–5.1)
Sodium: 141 mmol/L (ref 135–145)

## 2018-01-09 LAB — CBC
HEMATOCRIT: 42.1 % (ref 36.0–46.0)
Hemoglobin: 13.3 g/dL (ref 12.0–15.0)
MCH: 28.7 pg (ref 26.0–34.0)
MCHC: 31.6 g/dL (ref 30.0–36.0)
MCV: 90.7 fL (ref 78.0–100.0)
Platelets: 242 10*3/uL (ref 150–400)
RBC: 4.64 MIL/uL (ref 3.87–5.11)
RDW: 13.7 % (ref 11.5–15.5)
WBC: 11.6 10*3/uL — ABNORMAL HIGH (ref 4.0–10.5)

## 2018-01-09 MED ORDER — PREDNISONE 20 MG PO TABS
40.0000 mg | ORAL_TABLET | Freq: Every day | ORAL | Status: DC
  Administered 2018-01-10 – 2018-01-11 (×2): 40 mg via ORAL
  Filled 2018-01-09 (×2): qty 2

## 2018-01-09 MED ORDER — FUROSEMIDE 40 MG PO TABS
60.0000 mg | ORAL_TABLET | Freq: Every day | ORAL | Status: DC
  Administered 2018-01-10 – 2018-01-11 (×2): 60 mg via ORAL
  Filled 2018-01-09 (×2): qty 1

## 2018-01-09 MED ORDER — IPRATROPIUM-ALBUTEROL 0.5-2.5 (3) MG/3ML IN SOLN
3.0000 mL | RESPIRATORY_TRACT | Status: DC
  Administered 2018-01-09 – 2018-01-10 (×5): 3 mL via RESPIRATORY_TRACT
  Filled 2018-01-09 (×5): qty 3

## 2018-01-09 MED ORDER — POTASSIUM CHLORIDE CRYS ER 20 MEQ PO TBCR
40.0000 meq | EXTENDED_RELEASE_TABLET | ORAL | Status: AC
  Administered 2018-01-09 (×3): 40 meq via ORAL
  Filled 2018-01-09 (×3): qty 2

## 2018-01-09 NOTE — Progress Notes (Signed)
[facility] PROGRESS NOTE  Marie [name] PXT:[id number] DOB: 13-Mar-1936 DOA: 01/07/2018 PCP: [name], MD  Interim summary history of present: 82 y.o. female with medical history significant of Gold stage II COPD (PFTs from 08/15/2017 with FEV1 of 69%) on as needed home oxygen, diastolic heart failure with subvalvular aortic gradient due to LVH by echo on 03/19/2017, Parkinson's disease, gated by oropharyngeal dysphagia, hypertension, anxiety, reflux who comes in with respiratory distress.  Patient reports that she was doing well until yesterday evening where she began to have increasing respiratory distress.  She has been wheezing more over the last 2-3 days.  She has required approximately 2 L of her as needed home oxygen and has been using this regularly.  She is also used every 4 hours albuterol via her nebulizer machine without any improvement in her symptoms.  She is also noted some new onset lower extremity edema.  She chronically sleeps upright due to dyspnea.  She has not had any cough, congestion, rhinorrhea, chest pain, orthopnea, paroxysmal nocturnal dyspnea, syncope, fevers, nausea, abdominal pain, vomiting, diarrhea. Family called EMS due to respiratory distress and patient was given nebulizer along with 125 mg of Solu-Medrol IV  Assessment/Plan: 1-acute on chronic respiratory failure: In the setting of COPD exacerbation and CHF exacerbation. -continue steroids( now transition to PO), pulmicort, duoneb and the sue of flutter valve -will continue doxycycline and slowly wean oxygen as tolerated -From the CHF exacerbation; patient with acute on chronic diastolic heart failure.  Will continue daily weights and strict intake and output -transition to PO lasix; dose adjusted to 60 mg daily. -continue Low-sodium diet; extended education/discussion with patient and daughter at bedside. -no BIPAP needed over 30 hours   2-hypertension -Blood pressure has remained stable and  well-controlled at this moment -Continue ARB and Lasix (last one to be transition to PO route)  3-Anxiety -Continue alprazolam as needed -Currently stable  4-Parkinson's disease -Continue carbidopa-levodopa  5-GERD -Continue PPI and H2 blocker.  6-severe protein calorie malnutrition -Appreciate nutritional service recommendation -Continue feeding supplements  7-dysphagia -Most likely associated with history of Parkinson's -Will ask SPL to assess and provide recommendations on diet consistency.  8-hypokalemia -Associated with active diuresis -Will replete potassium and follow electrolytes trend. -check Mg level  Code Status: DNR Family Communication: Husband at bedside Disposition Plan: continue telemetry bed, transition diuretics to PO, start steroids tapering Patient to be assess by SPL and PT.   Consultants:  None  Procedures:  See below for x-ray reports.  Antibiotics:  Doxycycline 3/22  HPI/Subjective: No fever, reports breathing improvement, no chest pain; expressed no swelling in her legs.  Patient and family at bedside reported son intermittent coughing spells while eating or taking medications.  She has not required BiPAP over 30 hours.  Objective: Vitals:   01/09/18 0856 01/09/18 1250  BP:    Pulse:    Resp:    Temp:    SpO2: 100% 98%    Intake/Output Summary (Last 24 hours) at 01/09/2018 1545 Last data filed at 01/09/2018 1005 Gross per 24 hour  Intake 690 ml  Output 800 ml  Net -110 ml   Filed Weights   01/07/18 2159 01/08/18 0500 01/09/18 0500  Weight: 56.9 kg (125 lb 7.1 oz) 57.7 kg (127 lb 3.3 oz) 56 kg (123 lb 7.3 oz)    Exam:   General: No fever, no chest pain, no nausea, no vomiting.  Patient reports breathing significantly improved and expressed no swelling in her legs.  Still using  2-3 L of nasal cannula supplementation and reporting just mild shortness of breath with exertion.  Nursing at bedside reported coughing spells while  eating and taking certain medications.  Cardiovascular: Positive systolic ejection murmur, lower S2, no rubs, no gallops, no JVD appreciated on exam. S1 and S2.  Respiratory: Decreased breath sounds at the bases, currently no wheezing, increased air movement bilaterally, no using accessory muscles.  Abdomen: Soft, nontender, nondistended, positive bowel sounds.  Musculoskeletal: No edema, no cyanosis, no clubbing.  Data Reviewed: Basic Metabolic Panel: Recent Labs  Lab 01/07/18 1722 01/08/18 0428 01/09/18 0419  NA 145 144 141  K 3.7 3.4* 3.1*  CL 104 105 103  CO2 29 26 29   GLUCOSE 103* 184* 138*  BUN 21* 22* 34*  CREATININE 1.02* 1.01* 0.97  CALCIUM 9.4 8.5* 8.5*   Liver Function Tests: Recent Labs  Lab 01/07/18 1722  AST 16  ALT 12*  ALKPHOS 49  BILITOT 1.3*  PROT 6.5  ALBUMIN 3.8   CBC: Recent Labs  Lab 01/07/18 1722 01/08/18 0428 01/09/18 0419  WBC 7.0 5.3 11.6*  NEUTROABS 3.6  --   --   HGB 14.7 14.4 13.3  HCT 46.9* 46.2* 42.1  MCV 91.4 91.3 90.7  PLT 207 224 242   Cardiac Enzymes: Recent Labs  Lab 01/07/18 1755  TROPONINI <0.03   BNP (last 3 results) Recent Labs    11/11/17 1628 12/15/17 0216 01/07/18 1722  BNP 493* 129.1* 205.0*    Studies: Dg Chest Port 1 View  Result Date: 01/07/2018 CLINICAL DATA:  Worsening shortness of breath EXAM: PORTABLE CHEST 1 VIEW COMPARISON:  12/15/2017, 10/20/2016 FINDINGS: Mild chronic bronchitic changes. No consolidation or effusion. Stable cardiomediastinal silhouette with aortic atherosclerosis. No pneumothorax. IMPRESSION: No active disease. Electronically Signed   By: Donavan Foil M.D.   On: 01/07/2018 17:44    Scheduled Meds: . ALPRAZolam  0.5 mg Oral TID  . arformoterol  15 mcg Nebulization Q12H  . aspirin  81 mg Oral Daily  . budesonide  0.5 mg Nebulization BID  . carbidopa-levodopa  1.5 tablet Oral BID  . cholecalciferol  2,000 Units Oral Daily  . enoxaparin (LOVENOX) injection  40 mg  Subcutaneous Q24H  . famotidine  20 mg Oral BID  . feeding supplement (ENSURE ENLIVE)  237 mL Oral Daily  . [START ON 01/10/2018] furosemide  60 mg Oral Daily  . ipratropium-albuterol  3 mL Nebulization Q4H WA  . irbesartan  150 mg Oral Daily  . mouth rinse  15 mL Mouth Rinse BID  . pantoprazole  40 mg Oral Daily  . potassium chloride  40 mEq Oral Q4H  . [START ON 01/10/2018] predniSONE  40 mg Oral Q breakfast   Continuous Infusions: . doxycycline (VIBRAMYCIN) IV Stopped (01/09/18 1205)    Principal Problem:   COPD with acute exacerbation (HCC) Active Problems:   Hyperlipidemia   Essential hypertension   Vitamin D deficiency   Depression, major, in remission ([place])   LVH (left ventricular hypertrophy)   Parkinson's plus syndrome (HCC)   Chronic respiratory failure with hypoxia, on home O2 therapy (HCC)   Oropharyngeal dysphagia   Chronic diastolic CHF (congestive heart failure) ([place])    Time spent: 25 minutes    [facility] Pager [phone]. If 7PM-7AM, please contact night-coverage at [email or web address], password [facility] 01/09/2018, 3:45 PM  LOS: 2 days

## 2018-01-09 NOTE — Progress Notes (Signed)
Pt c/o pain to rt wrist IV. Bleeding noted. Removed IV and dressing. Applied foam dressing to skin tear noted to hand. This nurse attempted new 24g IV without success to left forearm. Pt tolerated well.

## 2018-01-10 LAB — MAGNESIUM: Magnesium: 2.3 mg/dL (ref 1.7–2.4)

## 2018-01-10 LAB — BASIC METABOLIC PANEL
ANION GAP: 7 (ref 5–15)
BUN: 34 mg/dL — ABNORMAL HIGH (ref 6–20)
CALCIUM: 8.5 mg/dL — AB (ref 8.9–10.3)
CO2: 28 mmol/L (ref 22–32)
CREATININE: 0.96 mg/dL (ref 0.44–1.00)
Chloride: 108 mmol/L (ref 101–111)
GFR calc Af Amer: >60 mL/min (ref >60)
GFR calc non Af Amer: 55 mL/min — ABNORMAL LOW (ref >60)
Glucose, Bld: 83 mg/dL (ref 65–99)
Potassium: 4.1 mmol/L (ref 3.5–5.1)
Sodium: 143 mmol/L (ref 135–145)

## 2018-01-10 MED ORDER — ALUM & MAG HYDROXIDE-SIMETH 200-200-20 MG/5ML PO SUSP
15.0000 mL | Freq: Four times a day (QID) | ORAL | Status: DC | PRN
  Administered 2018-01-10: 15 mL via ORAL
  Filled 2018-01-10: qty 30

## 2018-01-10 MED ORDER — DOXYCYCLINE HYCLATE 100 MG PO TABS
100.0000 mg | ORAL_TABLET | Freq: Two times a day (BID) | ORAL | Status: DC
  Administered 2018-01-10 – 2018-01-11 (×3): 100 mg via ORAL
  Filled 2018-01-10 (×3): qty 1

## 2018-01-10 MED ORDER — IPRATROPIUM-ALBUTEROL 0.5-2.5 (3) MG/3ML IN SOLN
3.0000 mL | Freq: Three times a day (TID) | RESPIRATORY_TRACT | Status: DC
  Administered 2018-01-10 – 2018-01-11 (×4): 3 mL via RESPIRATORY_TRACT
  Filled 2018-01-10 (×4): qty 3

## 2018-01-10 NOTE — Plan of Care (Signed)
  Problem: Acute Rehab PT Goals(only PT should resolve) Goal: Pt Will Go Supine/Side To Sit Outcome: Progressing Flowsheets (Taken 01/10/2018 1531) Pt will go Supine/Side to Sit: with min guard assist Goal: Patient Will Transfer Sit To/From Stand Outcome: Progressing Flowsheets (Taken 01/10/2018 1531) Patient will transfer sit to/from stand: with minimal assist Goal: Pt Will Transfer Bed To Chair/Chair To Bed Outcome: Progressing Flowsheets (Taken 01/10/2018 1531) Pt will Transfer Bed to Chair/Chair to Bed: with min assist Goal: Pt Will Ambulate Outcome: Progressing Flowsheets (Taken 01/10/2018 1531) Pt will Ambulate: with minimal assist;with rolling walker;25 feet  3:31 PM, 01/10/18 Lonell Grandchild, MPT Physical Therapist with [facility] [phone] office [phone] mobile phone

## 2018-01-10 NOTE — Care Management Important Message (Signed)
Important Message  Patient Details  Name: Marie [name] MRN: [id number] Date of Birth: 02/20/1936   Medicare Important Message Given:  Yes    Sherald Barge, RN 01/10/2018, 2:53 PM

## 2018-01-10 NOTE — Care Management Important Message (Signed)
Important Message  Patient Details  Name: Marie [name] MRN: [id number] Date of Birth: 1936-03-23   Medicare Important Message Given:       Shelda Altes 01/10/2018, 2:22 PM

## 2018-01-10 NOTE — Evaluation (Signed)
Clinical/Bedside Swallow Evaluation Patient Details  Name: Marie [name] MRN: [id number] Date of Birth: [birth year]  Today's Date: 01/10/2018 Time: SLP Start Time (ACUTE ONLY): 1838 SLP Stop Time (ACUTE ONLY): 1907 SLP Time Calculation (min) (ACUTE ONLY): 29 min  Past Medical History:  Past Medical History:  Diagnosis Date  . Balance disorder 2008.     Falls a lot.  She can be standing and then leans too far over to one side   . Bulging disc   . Chronic bronchitis ([place])    due to congestion at times, on prednisone and advair  . COPD (chronic obstructive pulmonary disease) ([place])   . Depression    many years ago  . GERD (gastroesophageal reflux disease)   . Hearing loss    mild  . Hyperlipidemia   . Hypertension   . Iatrogenic adrenal insufficiency ([place])   . Incontinence    not indicated at this visit.  Marland Kitchen LVH (left ventricular hypertrophy) due to hypertensive disease    a. 02/2017: echo showing an EF of 65-70% with moderate LVH, hyperdynamic LV with subvalvular gradient of 70 mmHg, SAM, and mild MR  . Neuropathy    legs stay numb   . Osteoarthritis    hands,   . Osteoporosis   . Shortness of breath dyspnea   . Tubulovillous adenoma of rectum   . Wears dentures   . Wears glasses    Past Surgical History:  Past Surgical History:  Procedure Laterality Date  . ABDOMINAL HYSTERECTOMY    . APPENDECTOMY    . CHONDROPLASTY  09/19/2014   Procedure: CHONDROPLASTY;  Surgeon: Alta Corning, MD;  Location: [place];  Service: Orthopedics;;  . DILATION AND CURETTAGE OF UTERUS    . EXTERNAL FIXATION LEG  10/25/2012   Procedure: EXTERNAL FIXATION LEG;  Surgeon: Rozanna Box, MD;  Location: [place];  Service: Orthopedics;  Laterality: Right;  . EYE SURGERY     bilateral cataract surgery and lens implant  . FINGER SURGERY     fusions and debridements for OA  . INCONTINENCE SURGERY     multiple procedures, not cured  . KNEE ARTHROSCOPY WITH LATERAL MENISECTOMY Right  09/19/2014   Procedure: KNEE ARTHROSCOPY WITH LATERAL MENISECTOMY;  Surgeon: Alta Corning, MD;  Location: [place];  Service: Orthopedics;  Laterality: Right;  . KNEE ARTHROSCOPY WITH MEDIAL MENISECTOMY Right 09/19/2014   Procedure: RIGHT KNEE ARTHROSCOPY WITH MEDIAL AND LATERAL MENISECTOMIES. CHONDROPLASTY OF PATELLA-FEMORAL JOINT;  Surgeon: Alta Corning, MD;  Location: [place];  Service: Orthopedics;  Laterality: Right;  . RECTAL BIOPSY  09/21/2011   Procedure: BIOPSY RECTAL;  Surgeon: Merrie Roof, MD;  Location: [place];  Service: General;  Laterality: N/A;  3-4 cm  . RECTAL SURGERY     by dr. Marlou Starks, removal of polyp  . TONSILLECTOMY     HPI:  82 y.o.femalewith medical history significant ofGold stage II COPD (PFTs from 08/15/2017 with FEV1 of 69%) on as needed home oxygen, diastolic heart failure with subvalvular aortic gradient due to LVH by echo on 03/19/2017, Parkinson's disease, gated by oropharyngeal dysphagia, hypertension, anxiety, reflux who comes in with respiratory distress. Patient reports that she was doing well until yesterday evening where she began to have increasing respiratory distress. She has been wheezing more over the last 2-3 days. She has required approximately 2 L of her as needed home oxygen and has been using this regularly. She is also used every  4 hours albuterol via her nebulizer machine without any improvement in her symptoms. She is also noted some new onset lower extremity edema. She chronically sleeps upright due to dyspnea. She has not had any cough, congestion, rhinorrhea, chest pain, orthopnea, paroxysmal nocturnal dyspnea, syncope, fevers, nausea, abdominal pain, vomiting, diarrhea.   Assessment / Plan / Recommendation Clinical Impression  Clinical swallow evaluation completed and Pt's performance appears consistent with most recent objective assessment 09/2017 and from [facility] at [facility] 11/2017. Pt with prolonged oral prep  with solids, prompt with thins. Breath holding that she exhibited previously not present at this time. Pt with one delayed throat clear following regular textures, otherwise no overt signs or symptoms of aspiration observed. No family present at this time, however Pt verbalizes that she has been compliant with taking her parkinson's medications at home (previously stated that she did not consistently take them). Recommend D3/mech soft with thin liquids due to prolonged oral phase. No further acute SLP services needed at this time.     SLP Visit Diagnosis: Dysphagia, oral phase (R13.11)    Aspiration Risk  Mild aspiration risk    Diet Recommendation Dysphagia 3 (Mech soft);Thin liquid   Liquid Administration via: Cup;Straw Medication Administration: Whole meds with puree Supervision: Patient able to self feed;Intermittent supervision to cue for compensatory strategies;Staff to assist with self feeding Compensations: Small sips/bites;Slow rate Postural Changes: Seated upright at 90 degrees;Remain upright for at least 30 minutes after po intake    Other  Recommendations Oral Care Recommendations: Oral care BID;Staff/trained caregiver to provide oral care Other Recommendations: Clarify dietary restrictions   Follow up Recommendations Home health SLP;Outpatient SLP(Consider outpatient or home health SLP for Parkinson's seque)      Frequency and Duration            Prognosis Prognosis for Safe Diet Advancement: Good Barriers to Reach Goals: Cognitive deficits      Swallow Study   General Date of Onset: 01/07/18 HPI: 82 y.o.femalewith medical history significant ofGold stage II COPD (PFTs from 08/15/2017 with FEV1 of 69%) on as needed home oxygen, diastolic heart failure with subvalvular aortic gradient due to LVH by echo on 03/19/2017, Parkinson's disease, gated by oropharyngeal dysphagia, hypertension, anxiety, reflux who comes in with respiratory distress. Patient reports that she was  doing well until yesterday evening where she began to have increasing respiratory distress. She has been wheezing more over the last 2-3 days. She has required approximately 2 L of her as needed home oxygen and has been using this regularly. She is also used every 4 hours albuterol via her nebulizer machine without any improvement in her symptoms. She is also noted some new onset lower extremity edema. She chronically sleeps upright due to dyspnea. She has not had any cough, congestion, rhinorrhea, chest pain, orthopnea, paroxysmal nocturnal dyspnea, syncope, fevers, nausea, abdominal pain, vomiting, diarrhea. Type of Study: Bedside Swallow Evaluation Previous Swallow Assessment: 12/18 MBSS regular/thin; BSE 2/19 regular thin Diet Prior to this Study: Dysphagia 2 (chopped);Thin liquids Temperature Spikes Noted: No Respiratory Status: Nasal cannula History of Recent Intubation: No Behavior/Cognition: Alert;Cooperative;Pleasant mood Oral Cavity Assessment: Within Functional Limits Oral Care Completed by SLP: No Oral Cavity - Dentition: Dentures, top;Dentures, bottom Vision: Functional for self-feeding Self-Feeding Abilities: Able to feed self;Needs assist;Needs set up Patient Positioning: Upright in bed Baseline Vocal Quality: Normal;Low vocal intensity Volitional Cough: Weak Volitional Swallow: Able to elicit    Oral/Motor/Sensory Function Overall Oral Motor/Sensory Function: Within functional limits   Ice Chips Ice chips:  Within functional limits Presentation: Spoon   Thin Liquid Thin Liquid: Within functional limits Presentation: Cup;Straw    Nectar Thick Nectar Thick Liquid: Not tested   Honey Thick Honey Thick Liquid: Not tested   Puree Puree: Within functional limits Presentation: Spoon   Solid   Thank you,  Genene Churn, CCC-SLP [phone]    Solid: Impaired Presentation: Self Fed Oral Phase Functional Implications: Prolonged oral transit Pharyngeal Phase  Impairments: Throat Clearing - Delayed        Marie [name] 01/10/2018,7:58 PM

## 2018-01-10 NOTE — Care Management Important Message (Signed)
Important Message  Patient Details  Name: Marie [name] MRN: [id number] Date of Birth: 1936/06/30   Medicare Important Message Given:       Shelda Altes 01/10/2018, 2:22 PM

## 2018-01-10 NOTE — Evaluation (Signed)
Physical Therapy Evaluation Patient Details Name: Marie [name] MRN: [id number] DOB: 1936-02-04 Today's Date: 01/10/2018   History of Present Illness  Marie [name] is a 82 y.o. female with medical history significant of Gold stage II COPD (PFTs from 08/15/2017 with FEV1 of 69%) on as needed home oxygen, diastolic heart failure with subvalvular aortic gradient due to LVH by echo on 03/19/2017, Parkinson's disease, gated by oropharyngeal dysphagia, hypertension, anxiety, reflux who comes in with respiratory distress.  Patient reports that she was doing well until yesterday evening where she began to have increasing respiratory distress.  She has been wheezing more over the last 2-3 days.  She has required approximately 2 L of her as needed home oxygen and has been using this regularly.  She is also used every 4 hours albuterol via her nebulizer machine without any improvement in her symptoms.  She is also noted some new onset lower extremity edema.  She chronically sleeps upright due to dyspnea.  She has not had any cough, congestion, rhinorrhea, chest pain, orthopnea, paroxysmal nocturnal dyspnea, syncope, fevers, nausea, abdominal pain, vomiting, diarrhea.    Clinical Impression  Patient limited to a few steps at bedside due to c/o fatigue after sitting up on [facility] for BM, very unsteady on feet and at risk for falls without assistance.  Patient will benefit from continued physical therapy in hospital and recommended venue below to increase strength, balance, endurance for safe ADLs and gait.    Follow Up Recommendations SNF;Supervision/Assistance - 24 hour    Equipment Recommendations       Recommendations for Other Services       Precautions / Restrictions Precautions Precautions: Fall Restrictions Weight Bearing Restrictions: No      Mobility  Bed Mobility Overal bed mobility: Needs Assistance Bed Mobility: Supine to Sit;Sit to Supine     Supine to sit: Mod assist Sit to supine:  Mod assist      Transfers Overall transfer level: Needs assistance Equipment used: Rolling walker (2 wheeled) Transfers: Sit to/from [facility] Sit to Stand: Mod assist Stand pivot transfers: Mod assist          Ambulation/Gait Ambulation/Gait assistance: Mod assist Ambulation Distance (Feet): 4 Feet Assistive device: Rolling walker (2 wheeled) Gait Pattern/deviations: Decreased step length - right;Decreased step length - left;Decreased stride length   Gait velocity interpretation: Below normal speed for age/gender General Gait Details: limited to 5-6 steps at bedside due to c/o fatigue and poor standing balance  Stairs            Wheelchair Mobility    Modified Rankin (Stroke Patients Only)       Balance Overall balance assessment: Needs assistance Sitting-balance support: Feet supported;No upper extremity supported Sitting balance-Leahy Scale: Fair     Standing balance support: Bilateral upper extremity supported;During functional activity Standing balance-Leahy Scale: Poor Standing balance comment: fair/poor with RW                             Pertinent Vitals/Pain Pain Assessment: No/denies pain    Home Living Family/patient expects to be discharged to:: Private residence Living Arrangements: Spouse/significant other Available Help at Discharge: Family;Available 24 hours/day Type of Home: House Home Access: Ramped entrance     Home Layout: Two level;Able to live on main level with bedroom/bathroom Home Equipment: Walker - 4 wheels;Cane - single point;Shower seat;Grab bars - toilet;Grab bars - tub/shower;Wheelchair - manual      Prior  Function Level of Independence: Independent with assistive device(s)         Comments: pt reports that she ambulates with use of a rollator and has a manual w/c for longer community distances     Hand Dominance        Extremity/Trunk Assessment   Upper Extremity  Assessment Upper Extremity Assessment: Generalized weakness    Lower Extremity Assessment Lower Extremity Assessment: Generalized weakness    Cervical / Trunk Assessment Cervical / Trunk Assessment: Kyphotic  Communication   Communication: No difficulties  Cognition Arousal/Alertness: Awake/alert Behavior During Therapy: WFL for tasks assessed/performed Overall Cognitive Status: Within Functional Limits for tasks assessed                                        General Comments      Exercises     Assessment/Plan    PT Assessment Patient needs continued PT services  PT Problem List Decreased strength;Decreased activity tolerance;Decreased balance;Decreased mobility       PT Treatment Interventions Gait training;Functional mobility training;Therapeutic activities;Therapeutic exercise;Patient/family education    PT Goals (Current goals can be found in the Care Plan section)  Acute Rehab PT Goals Patient Stated Goal: return home  PT Goal Formulation: With patient/family Time For Goal Achievement: 01/24/18 Potential to Achieve Goals: Good    Frequency Min 3X/week   Barriers to discharge        Co-evaluation               AM-PAC PT "6 Clicks" Daily Activity  Outcome Measure Difficulty turning over in bed (including adjusting bedclothes, sheets and blankets)?: A Little Difficulty moving from lying on back to sitting on the side of the bed? : A Lot Difficulty sitting down on and standing up from a chair with arms (e.g., wheelchair, bedside commode, etc,.)?: A Lot Help needed moving to and from a bed to chair (including a wheelchair)?: A Lot Help needed walking in hospital room?: A Lot Help needed climbing 3-5 steps with a railing? : Total 6 Click Score: 12    End of Session   Activity Tolerance: Patient limited by fatigue Patient left: in bed;with call bell/phone within reach;with family/visitor present(seated at bedside) Nurse Communication:  Mobility status;Other (comment)(RN notified patient left sitting up at bedside to eat luch with spouse assisting ) PT Visit Diagnosis: Unsteadiness on feet (R26.81);Other abnormalities of gait and mobility (R26.89);Muscle weakness (generalized) (M62.81)    Time: 3474-2595 PT Time Calculation (min) (ACUTE ONLY): 22 min   Charges:   PT Evaluation $PT Eval Moderate Complexity: 1 Mod PT Treatments $Therapeutic Activity: 8-22 mins   PT G Codes:        3:29 PM, 2018/02/02 Lonell Grandchild, MPT Physical Therapist with [facility] [phone] office [phone] mobile phone

## 2018-01-10 NOTE — Progress Notes (Signed)
[facility] PROGRESS NOTE  Marie [name] SWH:[id number] DOB: 1936-01-10 DOA: 01/07/2018 PCP: [name], MD  Interim summary history of present: 82 y.o. female with medical history significant of Gold stage II COPD (PFTs from 08/15/2017 with FEV1 of 69%) on as needed home oxygen, diastolic heart failure with subvalvular aortic gradient due to LVH by echo on 03/19/2017, Parkinson's disease, gated by oropharyngeal dysphagia, hypertension, anxiety, reflux who comes in with respiratory distress.  Patient reports that she was doing well until yesterday evening where she began to have increasing respiratory distress.  She has been wheezing more over the last 2-3 days.  She has required approximately 2 L of her as needed home oxygen and has been using this regularly.  She is also used every 4 hours albuterol via her nebulizer machine without any improvement in her symptoms.  She is also noted some new onset lower extremity edema.  She chronically sleeps upright due to dyspnea.  She has not had any cough, congestion, rhinorrhea, chest pain, orthopnea, paroxysmal nocturnal dyspnea, syncope, fevers, nausea, abdominal pain, vomiting, diarrhea. Family called EMS due to respiratory distress and patient was given nebulizer along with 125 mg of Solu-Medrol IV  Assessment/Plan: 1-acute on chronic respiratory failure: In the setting of COPD exacerbation and CHF exacerbation. -continue steroids tapering, pulmicort, duoneb and the use of flutter valve -will continue doxycycline and slowly wean oxygen as tolerated -follow strict intake and output and low sodium diet  -continue PO lasix (60mg ) -continue Low-sodium diet and daily weight -no BIPAP needed over 40 hours now  2-hypertension -Blood pressure has remained stable and well-controlled at this moment -Continue ARB and Lasix at current dose.  3-Anxiety -Continue alprazolam as needed -Currently  With stable mood  4-Parkinson's disease -Continue  carbidopa-levodopa  5-GERD -Continue PPI and H2 blocker.  6-severe protein calorie malnutrition -Appreciate nutritional service recommendation -Continue feeding supplements  7-dysphagia -Most likely associated with history of Parkinson's -Will ask SPL to assess and provide recommendations on diet consistency. -For now using dysphagia 3 diet with thin liquids  8-hypokalemia -Associated with active diuresis -Repleted and within normal limits at this time -Magnesium stable.  9-physical deconditioning -Physical therapy has recommended skilled nursing facility for rehab -Social worker made aware.  10-urinary retention  ->400 CC's in her bladder and complaining of suprapubic pain -foley for in and out cath   Code Status: DNR Family Communication: Daughter at bedside Disposition Plan: continue telemetry bed, transition diuretics to PO, start steroids tapering Patient to be assess by SPL and physical therapy recommending SNF  Consultants:  None  Procedures:  See below for x-ray reports.  Antibiotics:  Doxycycline 3/22  HPI/Subjective: No fever, no chest pain, no nausea, no vomiting.  Patient continued experiencing intermittent coughing spells and was complaining of suprapubic discomfort and inability to pass urine this morning.  Objective: Vitals:   01/10/18 1440 01/10/18 1500  BP:  (!) 121/47  Pulse:  85  Resp:  18  Temp:  97.7 F (36.5 C)  SpO2: 99% 100%    Intake/Output Summary (Last 24 hours) at 01/10/2018 1621 Last data filed at 01/10/2018 1500 Gross per 24 hour  Intake 480 ml  Output 2275 ml  Net -1795 ml   Filed Weights   01/08/18 0500 01/09/18 0500 01/10/18 0534  Weight: 57.7 kg (127 lb 3.3 oz) 56 kg (123 lb 7.3 oz) 58 kg (127 lb 13.9 oz)    Exam:   General: No fever, no chest pain, no nausea or vomiting.  Patient  reports breathing to be a stable and currently back to baseline 2 L nasal cannula with good oxygen saturation.  Expressed some lower  suprapubic area discomfort and found with acute urinary retention.  Patient has continued experiencing intermittent coughing spells while eating and drinking.  Cardiovascular: Positive systolic ejection murmur, loud S2, no rubs, no gallops.  Respiratory: Improved air movement bilaterally, no wheezing, no using accessory muscles, no frank crackles.  Abdomen: Soft, mild suprapubic tenderness, nondistended, positive bowel sounds.  Musculoskeletal: No edema, no cyanosis, no clubbing.  Data Reviewed: Basic Metabolic Panel: Recent Labs  Lab 01/07/18 1722 01/08/18 0428 01/09/18 0419 01/10/18 0633  NA 145 144 141 143  K 3.7 3.4* 3.1* 4.1  CL 104 105 103 108  CO2 29 26 29 28   GLUCOSE 103* 184* 138* 83  BUN 21* 22* 34* 34*  CREATININE 1.02* 1.01* 0.97 0.96  CALCIUM 9.4 8.5* 8.5* 8.5*  MG  --   --   --  2.3   Liver Function Tests: Recent Labs  Lab 01/07/18 1722  AST 16  ALT 12*  ALKPHOS 49  BILITOT 1.3*  PROT 6.5  ALBUMIN 3.8   CBC: Recent Labs  Lab 01/07/18 1722 01/08/18 0428 01/09/18 0419  WBC 7.0 5.3 11.6*  NEUTROABS 3.6  --   --   HGB 14.7 14.4 13.3  HCT 46.9* 46.2* 42.1  MCV 91.4 91.3 90.7  PLT 207 224 242   Cardiac Enzymes: Recent Labs  Lab 01/07/18 1755  TROPONINI <0.03   BNP (last 3 results) Recent Labs    11/11/17 1628 12/15/17 0216 01/07/18 1722  BNP 493* 129.1* 205.0*    Studies: No results found.  Scheduled Meds: . ALPRAZolam  0.5 mg Oral TID  . arformoterol  15 mcg Nebulization Q12H  . aspirin  81 mg Oral Daily  . budesonide  0.5 mg Nebulization BID  . carbidopa-levodopa  1.5 tablet Oral BID  . cholecalciferol  2,000 Units Oral Daily  . doxycycline  100 mg Oral Q12H  . enoxaparin (LOVENOX) injection  40 mg Subcutaneous Q24H  . famotidine  20 mg Oral BID  . feeding supplement (ENSURE ENLIVE)  237 mL Oral Daily  . furosemide  60 mg Oral Daily  . ipratropium-albuterol  3 mL Nebulization TID  . irbesartan  150 mg Oral Daily  . mouth  rinse  15 mL Mouth Rinse BID  . pantoprazole  40 mg Oral Daily  . predniSONE  40 mg Oral Q breakfast   Continuous Infusions:   Principal Problem:   COPD with acute exacerbation (HCC) Active Problems:   Hyperlipidemia   Essential hypertension   Vitamin D deficiency   Depression, major, in remission ([place])   LVH (left ventricular hypertrophy)   Parkinson's plus syndrome (HCC)   Chronic respiratory failure with hypoxia, on home O2 therapy (HCC)   Oropharyngeal dysphagia   Chronic diastolic CHF (congestive heart failure) ([place])    Time spent: 25 minutes   [facility] Pager [phone]. If 7PM-7AM, please contact night-coverage at [email or web address], password [facility] 01/10/2018, 4:21 PM  LOS: 3 days

## 2018-01-11 ENCOUNTER — Inpatient Hospital Stay
Admission: RE | Admit: 2018-01-11 | Discharge: 2018-02-05 | Disposition: A | Discharge status: Home / Self Care | Payer: Medicare Other | Source: Ambulatory Visit | Attending: Internal Medicine | Admitting: Internal Medicine

## 2018-01-11 DIAGNOSIS — Z79899 Other long term (current) drug therapy: Secondary | ICD-10-CM | POA: Diagnosis not present

## 2018-01-11 DIAGNOSIS — R279 Unspecified lack of coordination: Secondary | ICD-10-CM | POA: Diagnosis not present

## 2018-01-11 DIAGNOSIS — J9601 Acute respiratory failure with hypoxia: Secondary | ICD-10-CM | POA: Diagnosis not present

## 2018-01-11 DIAGNOSIS — F411 Generalized anxiety disorder: Secondary | ICD-10-CM | POA: Diagnosis not present

## 2018-01-11 DIAGNOSIS — R488 Other symbolic dysfunctions: Secondary | ICD-10-CM | POA: Diagnosis not present

## 2018-01-11 DIAGNOSIS — I5032 Chronic diastolic (congestive) heart failure: Secondary | ICD-10-CM | POA: Diagnosis not present

## 2018-01-11 DIAGNOSIS — E782 Mixed hyperlipidemia: Secondary | ICD-10-CM | POA: Diagnosis not present

## 2018-01-11 DIAGNOSIS — E43 Unspecified severe protein-calorie malnutrition: Secondary | ICD-10-CM | POA: Diagnosis not present

## 2018-01-11 DIAGNOSIS — J42 Unspecified chronic bronchitis: Secondary | ICD-10-CM | POA: Diagnosis not present

## 2018-01-11 DIAGNOSIS — J189 Pneumonia, unspecified organism: Secondary | ICD-10-CM | POA: Diagnosis not present

## 2018-01-11 DIAGNOSIS — Z9981 Dependence on supplemental oxygen: Secondary | ICD-10-CM | POA: Diagnosis not present

## 2018-01-11 DIAGNOSIS — K219 Gastro-esophageal reflux disease without esophagitis: Secondary | ICD-10-CM | POA: Diagnosis not present

## 2018-01-11 DIAGNOSIS — R7303 Prediabetes: Secondary | ICD-10-CM | POA: Diagnosis not present

## 2018-01-11 DIAGNOSIS — J441 Chronic obstructive pulmonary disease with (acute) exacerbation: Secondary | ICD-10-CM | POA: Diagnosis not present

## 2018-01-11 DIAGNOSIS — G20 Parkinson's disease: Secondary | ICD-10-CM | POA: Diagnosis not present

## 2018-01-11 DIAGNOSIS — G232 Striatonigral degeneration: Secondary | ICD-10-CM | POA: Diagnosis not present

## 2018-01-11 DIAGNOSIS — R410 Disorientation, unspecified: Secondary | ICD-10-CM | POA: Diagnosis not present

## 2018-01-11 DIAGNOSIS — I10 Essential (primary) hypertension: Secondary | ICD-10-CM | POA: Diagnosis not present

## 2018-01-11 DIAGNOSIS — F419 Anxiety disorder, unspecified: Secondary | ICD-10-CM | POA: Diagnosis not present

## 2018-01-11 DIAGNOSIS — R1312 Dysphagia, oropharyngeal phase: Secondary | ICD-10-CM | POA: Diagnosis not present

## 2018-01-11 DIAGNOSIS — J984 Other disorders of lung: Secondary | ICD-10-CM | POA: Diagnosis not present

## 2018-01-11 DIAGNOSIS — M6281 Muscle weakness (generalized): Secondary | ICD-10-CM | POA: Diagnosis not present

## 2018-01-11 DIAGNOSIS — R262 Difficulty in walking, not elsewhere classified: Secondary | ICD-10-CM | POA: Diagnosis not present

## 2018-01-11 DIAGNOSIS — E559 Vitamin D deficiency, unspecified: Secondary | ICD-10-CM | POA: Diagnosis not present

## 2018-01-11 DIAGNOSIS — E785 Hyperlipidemia, unspecified: Secondary | ICD-10-CM | POA: Diagnosis not present

## 2018-01-11 DIAGNOSIS — F325 Major depressive disorder, single episode, in full remission: Secondary | ICD-10-CM | POA: Diagnosis not present

## 2018-01-11 DIAGNOSIS — R0602 Shortness of breath: Secondary | ICD-10-CM | POA: Diagnosis not present

## 2018-01-11 DIAGNOSIS — I517 Cardiomegaly: Secondary | ICD-10-CM | POA: Diagnosis not present

## 2018-01-11 DIAGNOSIS — I959 Hypotension, unspecified: Secondary | ICD-10-CM | POA: Diagnosis not present

## 2018-01-11 DIAGNOSIS — R339 Retention of urine, unspecified: Secondary | ICD-10-CM | POA: Diagnosis not present

## 2018-01-11 DIAGNOSIS — N289 Disorder of kidney and ureter, unspecified: Secondary | ICD-10-CM | POA: Diagnosis not present

## 2018-01-11 DIAGNOSIS — F3289 Other specified depressive episodes: Secondary | ICD-10-CM | POA: Diagnosis not present

## 2018-01-11 DIAGNOSIS — J9611 Chronic respiratory failure with hypoxia: Secondary | ICD-10-CM | POA: Diagnosis not present

## 2018-01-11 DIAGNOSIS — J4541 Moderate persistent asthma with (acute) exacerbation: Secondary | ICD-10-CM | POA: Diagnosis not present

## 2018-01-11 MED ORDER — DOXYCYCLINE HYCLATE 100 MG PO TABS: 100.0000 mg | ORAL_TABLET | Freq: Two times a day (BID) | ORAL | Status: AC

## 2018-01-11 MED ORDER — PREDNISONE 20 MG PO TABS: ORAL_TABLET | ORAL | Status: DC

## 2018-01-11 MED ORDER — FUROSEMIDE 20 MG PO TABS: 60.0000 mg | ORAL_TABLET | Freq: Every day | ORAL | Status: DC

## 2018-01-11 MED ORDER — ALPRAZOLAM 0.5 MG PO TABS: 0.5000 mg | ORAL_TABLET | Freq: Three times a day (TID) | ORAL | 0 refills | Status: DC

## 2018-01-11 MED ORDER — POLYETHYLENE GLYCOL 3350 17 G PO PACK: 17.0000 g | PACK | Freq: Every day | ORAL | Status: DC | PRN

## 2018-01-11 NOTE — Discharge Instructions (Signed)
Have BMET lab at follow up appt  With primary MD

## 2018-01-11 NOTE — Clinical Social Work Placement (Signed)
   CLINICAL SOCIAL WORK PLACEMENT  NOTE  Date:  01/11/2018  Patient Details  Name: Marie [name] MRN: [id number] Date of Birth: 23-Oct-1935  Clinical Social Work is seeking post-discharge placement for this patient at the [place] level of care (*CSW will initial, date and re-position this form in  chart as items are completed):  Yes   Patient/family provided with [facility] Work Department's list of facilities offering this level of care within the geographic area requested by the patient (or if unable, by the patient's family).  Yes   Patient/family informed of their freedom to choose among providers that offer the needed level of care, that participate in Medicare, Medicaid or managed care program needed by the patient, have an available bed and are willing to accept the patient.  Yes   Patient/family informed of [facility]'s ownership interest in [facility] and [facility], as well as of the fact that they are under no obligation to receive care at these facilities.  PASRR submitted to EDS on       PASRR number received on       Existing PASRR number confirmed on 01/11/18     FL2 transmitted to all facilities in geographic area requested by pt/family on 01/11/18     FL2 transmitted to all facilities within larger geographic area on       Patient informed that his/her managed care company has contracts with or will negotiate with certain facilities, including the following:        Yes   Patient/family informed of bed offers received.  Patient chooses bed at [facility]     Physician recommends and patient chooses bed at      Patient to be transferred to [facility] on 01/11/18.  Patient to be transferred to facility by wheelchair     Patient family notified on 01/11/18 of transfer.  Name of family member notified:  [name] (husband)     PHYSICIAN       Additional Comment: Pt will dc to [facility] today. Updated RN  who will call report and wheel pt over. DC clinical sent through the hub. No other SW needs for dc.   _______________________________________________ [name], LCSW 01/11/2018, 1:12 PM

## 2018-01-11 NOTE — Progress Notes (Signed)
IV removed and report called to [facility].  Transported by wc.  Husband at bedside

## 2018-01-11 NOTE — NC FL2 (Signed)
[place] MEDICAID FL2 LEVEL OF CARE SCREENING TOOL     IDENTIFICATION  Patient Name: Marie [name] Birthdate: 05-02-1936 Sex: female Admission Date (Current Location): 01/07/2018  [facility] and [place] Number:  [employer] and Address:  [place] [address] [place]      Provider Number: [id number]  Attending Physician Name and Address:  Barton Dubois, MD  Relative Name and Phone Number:  Marie [name] [phone]    Current Level of Care: Hospital Recommended Level of Care: [place] Prior Approval Number: [id number]  Date Approved/Denied: 10/26/12 PASRR Number: pending  Discharge Plan: SNF    Current Diagnoses: Patient Active Problem List   Diagnosis Date Noted  . COPD with acute exacerbation ([place]) 12/15/2017  . Acute on chronic respiratory failure with hypoxia ([place]) 12/15/2017  . Chronic diastolic CHF (congestive heart failure) ([place]) 12/15/2017  . Anxiety 12/15/2017  . Subvalvular aortic stenosis determined by imaging 11/15/2017  . Pedal edema 11/15/2017  . Problem with medical care compliance 11/11/2017  . Aortic atherosclerosis ([place]) 10/19/2017  . Oropharyngeal dysphagia 09/28/2017  . Chronic respiratory failure with hypoxia, on home O2 therapy ([place]) 09/20/2017  . Hypertensive heart disease 03/29/2017  . Protein-calorie malnutrition, severe 03/17/2017  . Parkinson's plus syndrome ([place]) 03/04/2017  . Restrictive lung disease 02/24/2017  . LVH (left ventricular hypertrophy) 01/07/2017  . Depression, major, in remission ([place]) 09/22/2016  . At high risk for falls 10/16/2015  . Prediabetes 08/23/2014  . Vitamin D deficiency 08/23/2014  . Medication management 08/23/2014  . Unstable Gait 01/30/2014  . Incontinence   . Hyperlipidemia   . GERD (gastroesophageal reflux disease)   . Hereditary and idiopathic peripheral neuropathy   . Essential hypertension   . Tubulovillous adenoma of rectum 09/04/2011     Orientation RESPIRATION BLADDER Height & Weight     Self, Time, Situation, Place  O2(See dc summary) Incontinent Weight: 123 lb 7.3 oz (56 kg) Height:  5\' 4"  (162.6 cm)  BEHAVIORAL SYMPTOMS/MOOD NEUROLOGICAL BOWEL NUTRITION STATUS      Continent Diet(See dc summary)  AMBULATORY STATUS COMMUNICATION OF NEEDS Skin   Extensive Assist Verbally Normal                       Personal Care Assistance Level of Assistance  Bathing, Feeding, Dressing Bathing Assistance: Limited assistance Feeding assistance: Independent Dressing Assistance: Limited assistance     Functional Limitations Info  Sight, Hearing, Speech Sight Info: Adequate Hearing Info: Adequate Speech Info: Adequate    SPECIAL CARE FACTORS FREQUENCY  PT (By licensed PT)     PT Frequency: 5 times week              Contractures Contractures Info: Not present    Additional Factors Info  Code Status, Allergies Code Status Info: DNR Allergies Info: Demerol, Ertapenem, Codeine, Morphine and related, Sulfate           Current Medications (01/11/2018):  This is the current hospital active medication list Current Facility-Administered Medications  Medication Dose Route Frequency Provider Last Rate Last Dose  . acetaminophen (TYLENOL) tablet 650 mg  650 mg Oral Q6H PRN Barton Dubois, MD       Or  . acetaminophen (TYLENOL) suppository 650 mg  650 mg Rectal Q6H PRN Barton Dubois, MD      . ALPRAZolam Duanne Moron) tablet 0.5 mg  0.5 mg Oral TID Barton Dubois, MD   0.5 mg at 01/11/18 1019  . alum &  mag hydroxide-simeth (MAALOX/MYLANTA) 200-200-20 MG/5ML suspension 15 mL  15 mL Oral Q6H PRN Barton Dubois, MD   15 mL at 01/10/18 1026  . arformoterol (BROVANA) nebulizer solution 15 mcg  15 mcg Nebulization Q12H Barton Dubois, MD   15 mcg at 01/11/18 [phone]  . aspirin chewable tablet 81 mg  81 mg Oral Daily Barton Dubois, MD   81 mg at 01/11/18 1019  . budesonide (PULMICORT) nebulizer solution 0.5 mg  0.5 mg  Nebulization BID Barton Dubois, MD   0.5 mg at 01/11/18 0738  . carbidopa-levodopa (SINEMET IR) 25-100 MG per tablet immediate release 1.5 tablet  1.5 tablet Oral BID Barton Dubois, MD   1.5 tablet at 01/11/18 1018  . cholecalciferol (VITAMIN D) tablet 2,000 Units  2,000 Units Oral Daily Barton Dubois, MD   2,000 Units at 01/11/18 1019  . doxycycline (VIBRA-TABS) tablet 100 mg  100 mg Oral Q12H Barton Dubois, MD   100 mg at 01/11/18 1018  . enoxaparin (LOVENOX) injection 40 mg  40 mg Subcutaneous Q24H Barton Dubois, MD   40 mg at 01/10/18 2106  . famotidine (PEPCID) tablet 20 mg  20 mg Oral BID Barton Dubois, MD   20 mg at 01/11/18 1019  . feeding supplement (ENSURE ENLIVE) (ENSURE ENLIVE) liquid 237 mL  237 mL Oral Daily Barton Dubois, MD   237 mL at 01/11/18 1020  . furosemide (LASIX) tablet 60 mg  60 mg Oral Daily Barton Dubois, MD   60 mg at 01/11/18 1018  . ipratropium-albuterol (DUONEB) 0.5-2.5 (3) MG/3ML nebulizer solution 3 mL  3 mL Nebulization TID Barton Dubois, MD   3 mL at 01/11/18 0738  . irbesartan (AVAPRO) tablet 150 mg  150 mg Oral Daily Barton Dubois, MD   150 mg at 01/11/18 1019  . MEDLINE mouth rinse  15 mL Mouth Rinse BID Barton Dubois, MD   15 mL at 01/11/18 1020  . ondansetron (ZOFRAN) tablet 4 mg  4 mg Oral Q6H PRN Barton Dubois, MD       Or  . ondansetron [facility]) injection 4 mg  4 mg Intravenous Q6H PRN Barton Dubois, MD      . pantoprazole (PROTONIX) EC tablet 40 mg  40 mg Oral Daily Barton Dubois, MD   40 mg at 01/11/18 1019  . polyethylene glycol (MIRALAX / GLYCOLAX) packet 17 g  17 g Oral Daily PRN Barton Dubois, MD      . predniSONE (DELTASONE) tablet 40 mg  40 mg Oral Q breakfast Barton Dubois, MD   40 mg at 01/11/18 1019  . traMADol (ULTRAM) tablet 50 mg  50 mg Oral Q6H PRN Barton Dubois, MD         Discharge Medications: Please see discharge summary for a list of discharge medications.  Relevant Imaging Results:  Relevant Lab  Results:   Additional Information SSN: [place]  [name], LCSW

## 2018-01-11 NOTE — NC FL2 (Deleted)
MEDICAID FL2 LEVEL OF CARE SCREENING TOOL     IDENTIFICATION  Patient Name: Marie [name] Birthdate: 06/26/1936 Sex: female Admission Date (Current Location): 01/07/2018  [facility] and [place] Number:  [employer] and Address:  [place] [address] [place]      Provider Number: [id number]  Attending Physician Name and Address:  Barton Dubois, MD  Relative Name and Phone Number:  Idalee Foxworthy [phone]    Current Level of Care: Hospital Recommended Level of Care: [place] Prior Approval Number:    Date Approved/Denied:   PASRR Number: pending  Discharge Plan: SNF    Current Diagnoses: Patient Active Problem List   Diagnosis Date Noted  . COPD with acute exacerbation ([place]) 12/15/2017  . Acute on chronic respiratory failure with hypoxia ([place]) 12/15/2017  . Chronic diastolic CHF (congestive heart failure) ([place]) 12/15/2017  . Anxiety 12/15/2017  . Subvalvular aortic stenosis determined by imaging 11/15/2017  . Pedal edema 11/15/2017  . Problem with medical care compliance 11/11/2017  . Aortic atherosclerosis ([place]) 10/19/2017  . Oropharyngeal dysphagia 09/28/2017  . Chronic respiratory failure with hypoxia, on home O2 therapy ([place]) 09/20/2017  . Hypertensive heart disease 03/29/2017  . Protein-calorie malnutrition, severe 03/17/2017  . Parkinson's plus syndrome ([place]) 03/04/2017  . Restrictive lung disease 02/24/2017  . LVH (left ventricular hypertrophy) 01/07/2017  . Depression, major, in remission ([place]) 09/22/2016  . At high risk for falls 10/16/2015  . Prediabetes 08/23/2014  . Vitamin D deficiency 08/23/2014  . Medication management 08/23/2014  . Unstable Gait 01/30/2014  . Incontinence   . Hyperlipidemia   . GERD (gastroesophageal reflux disease)   . Hereditary and idiopathic peripheral neuropathy   . Essential hypertension   . Tubulovillous adenoma of rectum 09/04/2011    Orientation  RESPIRATION BLADDER Height & Weight     Self, Time, Situation, Place  O2(See dc summary) Incontinent Weight: 123 lb 7.3 oz (56 kg) Height:  5\' 4"  (162.6 cm)  BEHAVIORAL SYMPTOMS/MOOD NEUROLOGICAL BOWEL NUTRITION STATUS      Continent Diet(See dc summary)  AMBULATORY STATUS COMMUNICATION OF NEEDS Skin   Extensive Assist Verbally Normal                       Personal Care Assistance Level of Assistance  Bathing, Feeding, Dressing Bathing Assistance: Limited assistance Feeding assistance: Independent Dressing Assistance: Limited assistance     Functional Limitations Info  Sight, Hearing, Speech Sight Info: Adequate Hearing Info: Adequate Speech Info: Adequate    SPECIAL CARE FACTORS FREQUENCY  PT (By licensed PT)     PT Frequency: 5 times week              Contractures Contractures Info: Not present    Additional Factors Info  Code Status, Allergies Code Status Info: DNR Allergies Info: Demerol, Ertapenem, Codeine, Morphine and related, Sulfate           Current Medications (01/11/2018):  This is the current hospital active medication list Current Facility-Administered Medications  Medication Dose Route Frequency Provider Last Rate Last Dose  . acetaminophen (TYLENOL) tablet 650 mg  650 mg Oral Q6H PRN Barton Dubois, MD       Or  . acetaminophen (TYLENOL) suppository 650 mg  650 mg Rectal Q6H PRN Barton Dubois, MD      . ALPRAZolam Duanne Moron) tablet 0.5 mg  0.5 mg Oral TID Barton Dubois, MD   0.5 mg at 01/11/18 1019  .  alum & mag hydroxide-simeth (MAALOX/MYLANTA) 200-200-20 MG/5ML suspension 15 mL  15 mL Oral Q6H PRN Barton Dubois, MD   15 mL at 01/10/18 1026  . arformoterol (BROVANA) nebulizer solution 15 mcg  15 mcg Nebulization Q12H Barton Dubois, MD   15 mcg at 01/11/18 [phone]  . aspirin chewable tablet 81 mg  81 mg Oral Daily Barton Dubois, MD   81 mg at 01/11/18 1019  . budesonide (PULMICORT) nebulizer solution 0.5 mg  0.5 mg Nebulization BID Barton Dubois, MD   0.5 mg at 01/11/18 0738  . carbidopa-levodopa (SINEMET IR) 25-100 MG per tablet immediate release 1.5 tablet  1.5 tablet Oral BID Barton Dubois, MD   1.5 tablet at 01/11/18 1018  . cholecalciferol (VITAMIN D) tablet 2,000 Units  2,000 Units Oral Daily Barton Dubois, MD   2,000 Units at 01/11/18 1019  . doxycycline (VIBRA-TABS) tablet 100 mg  100 mg Oral Q12H Barton Dubois, MD   100 mg at 01/11/18 1018  . enoxaparin (LOVENOX) injection 40 mg  40 mg Subcutaneous Q24H Barton Dubois, MD   40 mg at 01/10/18 2106  . famotidine (PEPCID) tablet 20 mg  20 mg Oral BID Barton Dubois, MD   20 mg at 01/11/18 1019  . feeding supplement (ENSURE ENLIVE) (ENSURE ENLIVE) liquid 237 mL  237 mL Oral Daily Barton Dubois, MD   237 mL at 01/11/18 1020  . furosemide (LASIX) tablet 60 mg  60 mg Oral Daily Barton Dubois, MD   60 mg at 01/11/18 1018  . ipratropium-albuterol (DUONEB) 0.5-2.5 (3) MG/3ML nebulizer solution 3 mL  3 mL Nebulization TID Barton Dubois, MD   3 mL at 01/11/18 0738  . irbesartan (AVAPRO) tablet 150 mg  150 mg Oral Daily Barton Dubois, MD   150 mg at 01/11/18 1019  . MEDLINE mouth rinse  15 mL Mouth Rinse BID Barton Dubois, MD   15 mL at 01/11/18 1020  . ondansetron (ZOFRAN) tablet 4 mg  4 mg Oral Q6H PRN Barton Dubois, MD       Or  . ondansetron [facility]) injection 4 mg  4 mg Intravenous Q6H PRN Barton Dubois, MD      . pantoprazole (PROTONIX) EC tablet 40 mg  40 mg Oral Daily Barton Dubois, MD   40 mg at 01/11/18 1019  . polyethylene glycol (MIRALAX / GLYCOLAX) packet 17 g  17 g Oral Daily PRN Barton Dubois, MD      . predniSONE (DELTASONE) tablet 40 mg  40 mg Oral Q breakfast Barton Dubois, MD   40 mg at 01/11/18 1019  . traMADol (ULTRAM) tablet 50 mg  50 mg Oral Q6H PRN Barton Dubois, MD         Discharge Medications: Please see discharge summary for a list of discharge medications.  Relevant Imaging Results:  Relevant Lab Results:   Additional  Information SSN: [place]  [name], LCSW

## 2018-01-11 NOTE — Clinical Social Work Note (Signed)
Clinical Social Work Assessment  Patient Details  Name: Marie [name] MRN: [id number] Date of Birth: 05-28-1936  Date of referral:  01/11/18               Reason for consult:  Discharge Planning                Permission sought to share information with:  Chartered certified accountant granted to share information::  Yes, Verbal Permission Granted  Name::        Agency::  [place], [place], [place], [place] [facility]  Relationship::     Contact Information:     Housing/Transportation Living arrangements for the past 2 months:  [place] of Information:  Patient Patient Interpreter Needed:  None Criminal Activity/Legal Involvement Pertinent to Current Situation/Hospitalization:  No - Comment as needed Significant Relationships:  Spouse, Adult Children Lives with:  Spouse Do you feel safe going back to the place where you live?  Yes Need for family participation in patient care:  No (Coment)  Care giving concerns: PT recommending SNF   Social Worker assessment / plan: Pt referred to CSW for SNF STR. Pt was seen by RN CM who was assisting LCSW. Pt agreeable to SNF. She requests referrals to all [place] and [facility] SNF's. Will start referrals and follow for potential dc today.  Employment status:  Retired Forensic scientist:  Medicare PT Recommendations:  [place] / Referral to community resources:     Patient/Family's Response to care: Pt accepting of care.  Patient/Family's Understanding of and Emotional Response to Diagnosis, Current Treatment, and Prognosis: Pt with good understanding and no emotional distress.  Emotional Assessment Appearance:  Appears stated age Attitude/Demeanor/Rapport:  Engaged Affect (typically observed):  Accepting Orientation:  Oriented to Place, Oriented to Self, Oriented to  Time, Oriented to Situation Alcohol / Substance use:    Psych involvement (Current and /or in the community):      Discharge Needs  Concerns to be addressed:  Discharge Planning Concerns Readmission within the last 30 days:  No Current discharge risk:  Physical Impairment Barriers to Discharge:  No Barriers Identified   [name], LCSW 01/11/2018, 11:02 AM

## 2018-01-11 NOTE — Discharge Summary (Signed)
Physician Discharge Summary  Marie [name] NID:[id number] DOB: 09-20-1936 DOA: 01/07/2018  PCP: [name], MD  Admit date: 01/07/2018 Discharge date: 01/11/2018  Time spent: 35 minutes  Recommendations for Outpatient Follow-up:  1. Repeat basic metabolic panel in 1 week to follow electrolytes and renal function 2. Follow closely patient volume status and further adjust if needed patient diuretic regimen. 3. Outpatient follow-up with pulmonologist (Dr. Lake Bells).   Discharge Diagnoses:  Principal Problem:   COPD with acute exacerbation ([place]) Active Problems:   Hyperlipidemia   Essential hypertension   Vitamin D deficiency   Depression, major, in remission ([place])   LVH (left ventricular hypertrophy)   Parkinson's plus syndrome ([facility])   Chronic respiratory failure with hypoxia, on home O2 therapy ([facility])   Oropharyngeal dysphagia   Acute respiratory failure with hypoxia ([facility])   Chronic diastolic CHF (congestive heart failure) ([place])   Discharge Condition: Stable and improved.  Patient discharged to skilled nursing facility for further care and rehabilitation.  Diet recommendation: Dysphagia 3 diet, low sodium (2-2.5 g of sodium in 24 hours).  Filed Weights   01/09/18 0500 01/10/18 0534 01/11/18 0532  Weight: 56 kg (123 lb 7.3 oz) 58 kg (127 lb 13.9 oz) 56 kg (123 lb 7.3 oz)    Brief History of present illness:  82 y.o.femalewith medical history significant ofGold stage II COPD (PFTs from 08/15/2017 with FEV1 of 69%) on as needed home oxygen, diastolic heart failure with subvalvular aortic gradient due to LVH by echo on 03/19/2017, Parkinson's disease, gated by oropharyngeal dysphagia, hypertension, anxiety, reflux who comes in with respiratory distress. Patient reports that she was doing well until yesterday evening where she began to have increasing respiratory distress. She has been wheezing more over the last 2-3 days. She has required approximately 2 L of her as needed  home oxygen and has been using this regularly. She is also used every 4 hours albuterol via her nebulizer machine without any improvement in her symptoms. She is also noted some new onset lower extremity edema. She chronically sleeps upright due to dyspnea. She has not had any cough, congestion, rhinorrhea, chest pain, orthopnea, paroxysmal nocturnal dyspnea, syncope, fevers, nausea, abdominal pain, vomiting, diarrhea. Family called EMS due to respiratory distress and patient was given nebulizer along with 125 mg of Solu-Medrol IV  Hospital Course:  1-acute on chronic respiratory failure: In the setting of COPD exacerbation and CHF exacerbation. -continue steroids tapering, pulmicort, PRN albuterol and the use of flutter valve -will continue doxycycline and slowly wean oxygen as tolerated (patient on 2L now with good O2 sat; might be able to use as needed with exertion and at bedtime in near future) -follow daily weights and low sodium diet   -continue PO lasix (60mg  daily) -no further need of BIPAP during this hospitalization   2-hypertension -Blood pressure has remained stable and well-controlled at this moment -Continue ARB and Lasix at adjusted.  3-Anxiety -Continue alprazolam as needed -Currently control/stable mood.  4-Parkinson's disease -Continue carbidopa-levodopa  5-GERD -Continue PPI and H2 blocker.  6-severe protein calorie malnutrition -Appreciate nutritional service recommendation -Continue feeding supplements.  7-dysphagia -Most likely associated with history of Parkinson's -SPL has evaluated patient and recommended dysphagia 3 diet with thin liquids -straight upright position   8-hypokalemia -Associated with active diuresis -Repleted and within normal limits at this time -Magnesium stable. -Repeat basic metabolic panel in 1 week to follow electrolytes trend and renal function.  9-physical deconditioning -Physical therapy has recommended skilled  nursing facility for rehab -  Social worker made aware; patient will be transferred to pain center for rehabilitation and physical therapy.  10-urinary retention  ->400 CC's in her bladder and complaining of suprapubic pain -foley for in and out cath  performed -Patient has remained since that isolated/transient episode without any discomfort unable to void on her own.    Procedures:  See below for x-ray reports  Consultations:  None  Discharge Exam: Vitals:   01/11/18 0532 01/11/18 0739  BP: (!) 111/46   Pulse: 69   Resp: 18   Temp: 97.7 F (36.5 C)   SpO2: 96% 95%    General: No fever, no chest pain, no nausea or vomiting.  Patient reports breathing to be a stable and currently back to baseline 2 L nasal cannula with good oxygen saturation.  Patient with some improvement in her ability to eat and drink with modified diet; denies any suprapubic discomfort has been able to void on her own.  Cardiovascular: Positive systolic ejection murmur, loud S2, no rubs, no gallops; no JVD.  Respiratory: Improved air movement bilaterally, no wheezing, no using accessory muscles, no frank crackles.  Abdomen: Soft,  no tenderness, nondistended, positive bowel sounds.  Musculoskeletal: No edema, no cyanosis, no clubbing.   Discharge Instructions   Discharge Instructions    (HEART FAILURE PATIENTS) Call MD:  Anytime you have any of the following symptoms: 1) 3 pound weight gain in 24 hours or 5 pounds in 1 week 2) shortness of breath, with or without a dry hacking cough 3) swelling in the hands, feet or stomach 4) if you have to sleep on extra pillows at night in order to breathe.   Complete by:  As directed    Diet - low sodium heart healthy   Complete by:  As directed    Discharge instructions   Complete by:  As directed    Take medications as prescribed Dysphasia 3 diet with thin liquids recommended  Maintain adequate hydration Low-sodium diet (target 2-2.5 g of sodium in 24  hours). Physical therapy and rehabilitation as per skilled nursing facility protocol. Arrange follow-up with PCP in 10 days after being discharged from facility. Please repeat basic metabolic panel in 1 week to follow electrolytes and renal function.     Allergies as of 01/11/2018      Reactions   Demerol  [meperidine Hcl] Other (See Comments)   Pt. Feels like she is suffocating   Ertapenem Hives   Caused whole body to turn red Other reaction(s): Redness Caused whole body to turn red   Codeine Other (See Comments)   hallucinations   Demerol Other (See Comments)   hallucinations   Morphine And Related Other (See Comments)   hallucinations   Other Other (See Comments)   Invan 7- pt became red all over   Sulfate Other (See Comments)   unknown      Medication List    STOP taking these medications   sodium chloride HYPERTONIC 3 % nebulizer solution     TAKE these medications   albuterol (2.5 MG/3ML) 0.083% nebulizer solution Commonly known as:  PROVENTIL Use 1 ampule in nebulizer 4 times a day or every 4 hours as needed to rescue asthma.   ALPRAZolam 0.5 MG tablet Commonly known as:  XANAX Take 1 tablet (0.5 mg total) by mouth 3 (three) times daily.   aspirin 81 MG tablet Take 81 mg by mouth daily. Buys OTC   budesonide 0.5 MG/2ML nebulizer solution Commonly known as:  PULMICORT Take 2  mLs (0.5 mg total) by nebulization 2 (two) times daily. Dx-J45.909, J44.0, J20.9   carbidopa-levodopa 25-100 MG tablet Commonly known as:  SINEMET IR Take 1.5 tablets by mouth 2 (two) times daily.   doxycycline 100 MG tablet Commonly known as:  VIBRA-TABS Take 1 tablet (100 mg total) by mouth every 12 (twelve) hours for 4 days.   ENSURE Take 237 mLs by mouth daily.   famotidine 20 MG tablet Commonly known as:  PEPCID Take 20 mg by mouth 2 (two) times daily.   formoterol 20 MCG/2ML nebulizer solution Commonly known as:  PERFOROMIST Take 2 mLs (20 mcg total) by nebulization 2  (two) times daily.   furosemide 20 MG tablet Commonly known as:  LASIX Take 3 tablets (60 mg total) by mouth daily. Start taking on:  01/12/2018 What changed:    medication strength  how much to take  how to take this  when to take this  additional instructions   olmesartan 20 MG tablet Commonly known as:  BENICAR Take 1 tablet daily for BP (replaces Losartan) What changed:    how much to take  how to take this  when to take this  additional instructions   omeprazole 40 MG capsule Commonly known as:  PRILOSEC Take 1 capsule daily for Acid Reflux   polyethylene glycol packet Commonly known as:  MIRALAX / GLYCOLAX Take 17 g by mouth daily as needed for mild constipation.   predniSONE 20 MG tablet Commonly known as:  DELTASONE 2 tablets by mouth daily times 1 day; then 1 tablet by mouth daily times 2 days; then half tablet by mouth daily times 3 days and stop prednisone.   PROCTOZONE-HC 2.5 % rectal cream Generic drug:  hydrocortisone Apply 1 application topically as needed for hemorrhoids.   Vitamin D 2000 units Caps Take 1 capsule by mouth daily.      Allergies  Allergen Reactions  . Demerol  [Meperidine Hcl] Other (See Comments)    Pt. Feels like she is suffocating  . Ertapenem Hives    Caused whole body to turn red Other reaction(s): Redness Caused whole body to turn red  . Codeine Other (See Comments)    hallucinations  . Demerol Other (See Comments)    hallucinations  . Morphine And Related Other (See Comments)    hallucinations  . Other Other (See Comments)    Invan 7- pt became red all over  . Sulfate Other (See Comments)    unknown    Contact information for follow-up providers    [name], MD. Schedule an appointment as soon as possible for a visit in 10 day(s).   Specialty:  Internal Medicine Why:  after been discharged from SNF Contact information: [address] [place] [address] [phone]             Contact information for after-discharge care    [facility] .   Service:  Skilled Nursing Contact information: [address] [phone]                  The results of significant diagnostics from this hospitalization (including imaging, microbiology, ancillary and laboratory) are listed below for reference.    Significant Diagnostic Studies: Dg Chest Port 1 View  Result Date: 01/07/2018 CLINICAL DATA:  Worsening shortness of breath EXAM: PORTABLE CHEST 1 VIEW COMPARISON:  12/15/2017, 10/20/2016 FINDINGS: Mild chronic bronchitic changes. No consolidation or effusion. Stable cardiomediastinal silhouette with  aortic atherosclerosis. No pneumothorax. IMPRESSION: No active disease. Electronically Signed   By: Donavan Foil M.D.   On: 01/07/2018 17:44   Dg Chest Port 1 View  Result Date: 12/15/2017 CLINICAL DATA:  82 year old female with shortness of breath and wheezing. EXAM: PORTABLE CHEST 1 VIEW COMPARISON:  Chest radiograph dated 10/20/2017 FINDINGS: Chronic bronchitic changes. There is no focal consolidation, pleural effusion, or pneumothorax. The cardiac silhouette is within normal limits. Atherosclerotic calcification of the aortic arch. No acute osseous pathology. IMPRESSION: No active disease. Electronically Signed   By: Anner Crete M.D.   On: 12/15/2017 02:25   Labs: Basic Metabolic Panel: Recent Labs  Lab 01/07/18 1722 01/08/18 0428 01/09/18 0419 01/10/18 0633  NA 145 144 141 143  K 3.7 3.4* 3.1* 4.1  CL 104 105 103 108  CO2 29 26 29 28   GLUCOSE 103* 184* 138* 83  BUN 21* 22* 34* 34*  CREATININE 1.02* 1.01* 0.97 0.96  CALCIUM 9.4 8.5* 8.5* 8.5*  MG  --   --   --  2.3   Liver Function Tests: Recent Labs  Lab 01/07/18 1722  AST 16  ALT 12*  ALKPHOS 49  BILITOT 1.3*  PROT 6.5  ALBUMIN 3.8   CBC: Recent Labs  Lab 01/07/18 1722 01/08/18 0428 01/09/18 0419  WBC 7.0 5.3  11.6*  NEUTROABS 3.6  --   --   HGB 14.7 14.4 13.3  HCT 46.9* 46.2* 42.1  MCV 91.4 91.3 90.7  PLT 207 224 242   Cardiac Enzymes: Recent Labs  Lab 01/07/18 1755  TROPONINI <0.03   BNP: BNP (last 3 results) Recent Labs    11/11/17 1628 12/15/17 0216 01/07/18 1722  BNP 493* 129.1* 205.0*    Signed:  Barton Dubois MD.  [facility] 01/11/2018, 12:35 PM

## 2018-01-12 ENCOUNTER — Encounter: Payer: Self-pay | Admitting: Internal Medicine

## 2018-01-12 ENCOUNTER — Other Ambulatory Visit: Payer: Self-pay

## 2018-01-12 ENCOUNTER — Non-Acute Institutional Stay (SKILLED_NURSING_FACILITY): Payer: Medicare Other | Admitting: Internal Medicine

## 2018-01-12 ENCOUNTER — Encounter (HOSPITAL_COMMUNITY)
Admission: RE | Admit: 2018-01-12 | Discharge: 2018-01-12 | Disposition: A | Discharge status: Home / Self Care | Payer: Medicare Other | Source: Skilled Nursing Facility | Attending: Internal Medicine | Admitting: Internal Medicine

## 2018-01-12 DIAGNOSIS — I10 Essential (primary) hypertension: Secondary | ICD-10-CM | POA: Diagnosis not present

## 2018-01-12 DIAGNOSIS — F419 Anxiety disorder, unspecified: Secondary | ICD-10-CM

## 2018-01-12 DIAGNOSIS — I5032 Chronic diastolic (congestive) heart failure: Secondary | ICD-10-CM | POA: Insufficient documentation

## 2018-01-12 DIAGNOSIS — G232 Striatonigral degeneration: Secondary | ICD-10-CM | POA: Diagnosis not present

## 2018-01-12 DIAGNOSIS — K219 Gastro-esophageal reflux disease without esophagitis: Secondary | ICD-10-CM | POA: Diagnosis not present

## 2018-01-12 DIAGNOSIS — J9601 Acute respiratory failure with hypoxia: Secondary | ICD-10-CM | POA: Diagnosis not present

## 2018-01-12 DIAGNOSIS — G20C Parkinsonism, unspecified: Secondary | ICD-10-CM

## 2018-01-12 LAB — BASIC METABOLIC PANEL
Anion gap: 13 (ref 5–15)
BUN: 38 mg/dL — AB (ref 6–20)
CALCIUM: 9.2 mg/dL (ref 8.9–10.3)
CHLORIDE: 99 mmol/L — AB (ref 101–111)
CO2: 31 mmol/L (ref 22–32)
Creatinine, Ser: 1.04 mg/dL — ABNORMAL HIGH (ref 0.44–1.00)
GFR calc non Af Amer: 50 mL/min — ABNORMAL LOW (ref >60)
GFR, EST AFRICAN AMERICAN: 58 mL/min — AB (ref >60)
Glucose, Bld: 96 mg/dL (ref 65–99)
Potassium: 3.5 mmol/L (ref 3.5–5.1)
SODIUM: 143 mmol/L (ref 135–145)

## 2018-01-12 LAB — CBC WITH DIFFERENTIAL/PLATELET
BASOS PCT: 0 %
Basophils Absolute: 0 10*3/uL (ref 0.0–0.1)
EOS ABS: 0.1 10*3/uL (ref 0.0–0.7)
Eosinophils Relative: 1 %
HEMATOCRIT: 48.2 % — AB (ref 36.0–46.0)
HEMOGLOBIN: 15.2 g/dL — AB (ref 12.0–15.0)
LYMPHS ABS: 3 10*3/uL (ref 0.7–4.0)
Lymphocytes Relative: 36 %
MCH: 29 pg (ref 26.0–34.0)
MCHC: 31.5 g/dL (ref 30.0–36.0)
MCV: 91.8 fL (ref 78.0–100.0)
MONO ABS: 0.7 10*3/uL (ref 0.1–1.0)
MONOS PCT: 9 %
NEUTROS PCT: 54 %
Neutro Abs: 4.3 10*3/uL (ref 1.7–7.7)
Platelets: 227 10*3/uL (ref 150–400)
RBC: 5.25 MIL/uL — ABNORMAL HIGH (ref 3.87–5.11)
RDW: 13.7 % (ref 11.5–15.5)
WBC: 8.2 10*3/uL (ref 4.0–10.5)

## 2018-01-12 MED ORDER — ALPRAZOLAM 0.5 MG PO TABS: 0.5000 mg | ORAL_TABLET | Freq: Three times a day (TID) | ORAL | 0 refills | Status: DC

## 2018-01-12 NOTE — Progress Notes (Signed)
Location:   [facility] Room Number: 155/P Place of Service:  SNF (31) Provider:  Billey Gosling, MD  Patient Care Team: [name], MD as PCP - General (Internal Medicine) Dorna Leitz, MD as Consulting Physician (Orthopedic Surgery) Teena Irani, MD (Inactive) as Consulting Physician (Gastroenterology) Garrel Ridgel, DPM as Consulting Physician (Podiatry) Juanito Doom, MD as Consulting Physician (Pulmonary Disease) Alda Berthold, DO as Consulting Physician (Neurology)  Extended Emergency Contact Information Primary Emergency Contact: [facility] Address: [address]          [place], [place] [address] [place] Phone: [phone] Mobile Phone: [phone] Relation: Spouse Secondary Emergency Contact: [place] Mobile Phone: [phone] Relation: Daughter  Code Status:  Full Code Goals of care: Advanced Directive information Advanced Directives 01/12/2018  Does Patient Have a Medical Advance Directive? Yes  Type of Advance Directive (No Data)  Does patient want to make changes to medical advance directive? No - Patient declined  Copy of [place] in Chart? No - copy requested  Would patient like information on creating a medical advance directive? -  Pre-existing out of facility DNR order (yellow form or pink MOST form) -     Chief Complaint  Patient presents with  . Hospitalization Follow-up    F/U Hospitalization Visit  For acute on chronic respiratory failure  HPI:  Pt is a 82 y.o. female seen today for a hospital f/u  status post hospital admission for respiratory failure.   Patient has a history COPD on as needed oxygen at home as well as diastolic CHF Parkinson's disease hypertension anxiety GERD.  She presented to the hospital in respiratory distress.  She also had new onset of lower extremity edema.  She is brought in by EMS because of respiratory distress and was  given a nebulizer along with Solu-Medrol IV.  Regards to her hospital course her COPD-respiratory failure was treated as well as CHFexacerbation--.  She is on a tapering dose of steroids as well as Pulmicort albuterol Proventil as well as performist--- she continues on oxygen-she is not complaining of increased shortness of breath today and feels she is doing better although continues to be quite frail. She is completing a course of doxycycline as well In regards to CHF she is on Lasix and her weights will need to be monitored today she does not really appear to have significant edema.  During her hospitalization she was also found to have urinary retention-and a catheter was placed-she continues on.  She also had hypokalemia suspect secondary to Lasix this was replenished magnesium stable potassium was 3.5 on lab done today I suspect she would benefit from some  low-dose potassium while she is on the Lasix this will need to be rechecked.  She also continues to be quite frail with protein calorie malnutrition and she is on supplements again her weights will have to be watched  I note her albumin was 3.8 in the hospital.  In regards to hypertension this apparently was stable on Benicar and Lasix blood pressure today was 106/78 taken manually.  She also has a history of anxiety is on Xanax and apparently has tolerated this well.  Regards Parkinson's disease she is on Sinemet and this appears to be controlled from what I see today.-She does have some dysphasia thought secondary to the Parkinson's most likely she is on a dysphagia 3 diet with thin liquids.    She is also on a PPI  and a H2 blocker with a history of GERD apparently she has been on this long-term and has been stable.  Currently she is sitting in her wheelchair comfortably has no complaints feel her breathing is better vital signs appear to be stable O2 saturation is in the 90s on oxygen supplementation.  She would benefit from  strengthening and therapy.      Past Medical History:  Diagnosis Date  . Balance disorder 2008.     Falls a lot.  She can be standing and then leans too far over to one side   . Bulging disc   . Chronic bronchitis ([place])    due to congestion at times, on prednisone and advair  . COPD (chronic obstructive pulmonary disease) ([place])   . Depression    many years ago  . GERD (gastroesophageal reflux disease)   . Hearing loss    mild  . Hyperlipidemia   . Hypertension   . Iatrogenic adrenal insufficiency ([place])   . Incontinence    not indicated at this visit.  Marland Kitchen LVH (left ventricular hypertrophy) due to hypertensive disease    a. 02/2017: echo showing an EF of 65-70% with moderate LVH, hyperdynamic LV with subvalvular gradient of 70 mmHg, SAM, and mild MR  . Neuropathy    legs stay numb   . Osteoarthritis    hands,   . Osteoporosis   . Shortness of breath dyspnea   . Tubulovillous adenoma of rectum   . Wears dentures   . Wears glasses    Past Surgical History:  Procedure Laterality Date  . ABDOMINAL HYSTERECTOMY    . APPENDECTOMY    . CHONDROPLASTY  09/19/2014   Procedure: CHONDROPLASTY;  Surgeon: Alta Corning, MD;  Location: [facility];  Service: Orthopedics;;  . DILATION AND CURETTAGE OF UTERUS    . EXTERNAL FIXATION LEG  10/25/2012   Procedure: EXTERNAL FIXATION LEG;  Surgeon: Rozanna Box, MD;  Location: [facility];  Service: Orthopedics;  Laterality: Right;  . EYE SURGERY     bilateral cataract surgery and lens implant  . FINGER SURGERY     fusions and debridements for OA  . INCONTINENCE SURGERY     multiple procedures, not cured  . KNEE ARTHROSCOPY WITH LATERAL MENISECTOMY Right 09/19/2014   Procedure: KNEE ARTHROSCOPY WITH LATERAL MENISECTOMY;  Surgeon: Alta Corning, MD;  Location: [facility];  Service: Orthopedics;  Laterality: Right;  . KNEE ARTHROSCOPY WITH MEDIAL MENISECTOMY Right 09/19/2014   Procedure: RIGHT KNEE ARTHROSCOPY WITH  MEDIAL AND LATERAL MENISECTOMIES. CHONDROPLASTY OF PATELLA-FEMORAL JOINT;  Surgeon: Alta Corning, MD;  Location: [facility];  Service: Orthopedics;  Laterality: Right;  . RECTAL BIOPSY  09/21/2011   Procedure: BIOPSY RECTAL;  Surgeon: Merrie Roof, MD;  Location: [facility];  Service: General;  Laterality: N/A;  3-4 cm  . RECTAL SURGERY     by dr. Marlou Starks, removal of polyp  . TONSILLECTOMY      Allergies  Allergen Reactions  . Demerol  [Meperidine Hcl] Other (See Comments)    Pt. Feels like she is suffocating  . Ertapenem Hives    Caused whole body to turn red Other reaction(s): Redness Caused whole body to turn red  . Codeine Other (See Comments)    hallucinations  . Demerol Other (See Comments)    hallucinations  . Morphine And Related Other (See Comments)    hallucinations  . Other Other (See Comments)    Invan  7- pt became red all over  . Sulfate Other (See Comments)    unknown    Outpatient Encounter Medications as of 01/12/2018  Medication Sig  . albuterol (PROVENTIL) (2.5 MG/3ML) 0.083% nebulizer solution Use 1 ampule in nebulizer 4 times a day or every 4 hours as needed to rescue asthma.  . ALPRAZolam (XANAX) 0.5 MG tablet Take 1 tablet (0.5 mg total) by mouth 3 (three) times daily.  Marland Kitchen aspirin 81 MG tablet Take 81 mg by mouth daily. Buys OTC  . budesonide (PULMICORT) 0.5 MG/2ML nebulizer solution Take 2 mLs (0.5 mg total) by nebulization 2 (two) times daily. Dx-J45.909, J44.0, J20.9  . carbidopa-levodopa (SINEMET IR) 25-100 MG tablet Take 1.5 tablets by mouth 2 (two) times daily.  . Cholecalciferol (VITAMIN D) 2000 units CAPS Take 1 capsule by mouth daily.  Marland Kitchen doxycycline (VIBRA-TABS) 100 MG tablet Take 1 tablet (100 mg total) by mouth every 12 (twelve) hours for 4 days.  . ENSURE (ENSURE) Take 237 mLs by mouth daily.  . famotidine (PEPCID) 20 MG tablet Take 20 mg by mouth 2 (two) times daily.   . formoterol (PERFOROMIST) 20 MCG/2ML nebulizer solution Take 2 mLs  (20 mcg total) by nebulization 2 (two) times daily.  . furosemide (LASIX) 20 MG tablet Take 3 tablets (60 mg total) by mouth daily.  Marland Kitchen olmesartan (BENICAR) 20 MG tablet Take 1 tablet daily for BP (replaces Losartan)  . omeprazole (PRILOSEC) 40 MG capsule Take 1 capsule daily for Acid Reflux  . polyethylene glycol (MIRALAX / GLYCOLAX) packet Take 17 g by mouth daily as needed for mild constipation.  . predniSONE (DELTASONE) 20 MG tablet 2 tablets by mouth daily times 1 day; then 1 tablet by mouth daily times 2 days; then half tablet by mouth daily times 3 days and stop prednisone.  Marland Kitchen PROCTOZONE-HC 2.5 % rectal cream Apply 1 application topically as needed for hemorrhoids.    No facility-administered encounter medications on file as of 01/12/2018.      Review of Systems   In general she is not complaining of fever chills she does have weakness.  Skin does not complain of rashes itching or diaphoresis she appears to have quite fragile skin with some chronic bruising.--- She also has coverings of her heels bilaterally and left forearm  Head ears eyes nose mouth and throat is not complaining of sore throat or visual changes.  Respiratory she is on oxygen does not complain of shortness of breath at this time or increased cough has a history of respiratory failure.  Cardiac does not complain of chest pain does not appear to have significant edema.  GI is not complaining of abdominal discomfort nausea vomiting diarrhea constipation.  GU does have an indwelling Foley catheter with urinary retention history which apparently started in the hospital does not complain of burning with urination or discomfort.  Musculoskeletal does have generalized weakness quite frail but does not complain of joint pain.  Neurologic is not complaining of dizziness headache or numbness does have a history of Parkinson's disease.  Psych does not complain of being depressed or anxious does have a history of anxiety is  on Xanax 3 times.    Immunization History  Administered Date(s) Administered  . Influenza, High Dose Seasonal PF 08/23/2014, 07/24/2015, 08/13/2016, 07/12/2017  . Pneumococcal Conjugate-13 08/23/2014  . Pneumococcal-Unspecified 07/20/2003, 08/17/2013  . Td 08/17/2013  . Tdap 10/25/2012  . Zoster 03/19/2015   Pertinent  Health Maintenance Due  Topic Date Due  . INFLUENZA  VACCINE  Completed  . DEXA SCAN  Completed  . PNA vac Low Risk Adult  Completed   Fall Risk  11/18/2017 10/28/2017 10/20/2017 10/04/2017 06/11/2017  Falls in the past year? No Yes No No No  Number falls in past yr: - 2 or more - - -  Injury with Fall? No No - - -  Risk Factor Category  - - - - -  Comment - - - - -  Risk for fall due to : History of fall(s);Impaired balance/gait;Impaired mobility Impaired balance/gait;History of fall(s);Impaired mobility History of fall(s);Impaired balance/gait;Impaired mobility - -  Risk for fall due to: Comment - - - - -  Follow up Falls evaluation completed Falls evaluation completed;Falls prevention discussed - - -  Of note manual blood pressure 1 Functional Status Survey:    Vitals:   01/12/18 1203  BP: 105/68  Pulse: 62  Resp: 20  Temp: (!) 97 F (36.1 C)  TempSrc: Oral  SpO2: 99%  Of note manual blood pressure today is 106/78  Physical Exam   In general this is a very pleasant frail appearing elderly female in no distress sitting in her wheelchair.  Her skin is warm and dry she does have some chronic bruising with quite fragile skin diffuse.  She also has covering of her heels bilaterally and left forearm   Appears to have stasis color changes of her lower extremities bilaterally.  Eyes she does have somewhat large pupils that are reactive visual acuity appears to be intact sclera and conjunctive are clear.  Oropharynx is clear mucous membranes moist.  Chest is clear to auscultation with shallow air entry there is no labored breathing.  Heart is regular rate  and rhythm without murmur gallop or rub she does not really have significant lower extremity edema pedal pulses are palpable.  Abdomen is soft nontender with positive bowel sounds.  GU has an indwelling Foley catheter draining amber colored urine.  Musculoskeletal has general frailty is able to move all extremities x4 with weakness all extremities.  She is status post left third toe amputation   neurologic appears grossly intact her speech is clear no lateralizing findings could not really appreciate tremor on exam Her speech is clear .  Psych she is alert and oriented very pleasant and appropriate     Labs reviewed: Recent Labs    09/20/17 0755  10/28/17 1527  01/09/18 0419 01/10/18 0633 01/12/18 0749  NA  --    < >  --    < > 141 143 143  K  --    < >  --    < > 3.1* 4.1 3.5  CL  --    < >  --    < > 103 108 99*  CO2  --    < >  --    < > 29 28 31   GLUCOSE  --    < >  --    < > 138* 83 96  BUN  --    < >  --    < > 34* 34* 38*  CREATININE  --    < >  --    < > 0.97 0.96 1.04*  CALCIUM  --    < >  --    < > 8.5* 8.5* 9.2  MG 2.2  --  2.2  --   --  2.3  --    < > = values in this interval not displayed.   Recent  Labs    08/05/17 0912 12/15/17 0216 01/07/18 1722  AST 21 19 16   ALT 5* 12* 12*  ALKPHOS 57 48 49  BILITOT 0.8 1.2 1.3*  PROT 6.3* 6.0* 6.5  ALBUMIN 3.8 3.7 3.8   Recent Labs    12/20/17 1037 01/07/18 1722 01/08/18 0428 01/09/18 0419 01/12/18 0749  WBC 10.3 7.0 5.3 11.6* 8.2  NEUTROABS 7,890* 3.6  --   --  4.3  HGB 14.9 14.7 14.4 13.3 15.2*  HCT 43.9 46.9* 46.2* 42.1 48.2*  MCV 85.6 91.4 91.3 90.7 91.8  PLT 268 207 224 242 227   Lab Results  Component Value Date   TSH 1.87 10/28/2017   Lab Results  Component Value Date   HGBA1C 5.4 10/28/2017   Lab Results  Component Value Date   CHOL 138 05/25/2017   HDL 56 05/25/2017   LDLCALC 61 05/25/2017   TRIG 103 05/25/2017   CHOLHDL 2.5 05/25/2017    Significant Diagnostic Results in last  30 days:  No results found.  Assessment/Plan  #1-history of acute on chronic respiratory failure she appears to be stable yet fragile in this regards she is completing a steroid taper continues on Pulmicort as well as Proventil and Perforomist as well as albuterol nebulizers as needed-she also is on oxygen with attempts to try to wean this eventually-she is also completing a course of doxycycline--at this point appears stable O2 stats are in the 90s.  2.  History of CHF she is on Lasix 60 mg a day this appears to be compensated at this point her weights will need to be monitored closely I also note her potassium is borderline at 3.5 she is not on potassium supplementation and has a history of hypokalemia in the hospital we will start her on 3 0 mEq  QD and have this rechecked later this week   #3-history of hypertension at this point will monitor this appears fairly stable on Benicar As well as Lasix.  4.  History of Parkinson's disease she is on Sinemet twice daily she also has suspected associated dysphagia and is on a dysphagia 3 diet with thin liquids this will have to be monitored especially with her history of protein calorie malnutrition-she is on supplements albumin in hospital was 3.8.  5.  History of GERD she continues on both a PPI and H2 blocker apparently she has been on this for some time at this point will monitor.  6.  History of hypokalemia as noted above we will supplement this and she is on potassium this was supplemented in the hospital as well and magnesium was found to be stable.  7.  History of urinary retention she continues with an indwelling Foley catheter at some point I suspect he will attempt a voiding trial--will await Dr. Steve Rattler assessment   #8-history of hemorrhoids she does have orders for as needed medications as needed  #9 history of anxiety she is on Xanax 0.5 mg 3 times daily will write an order to hold for any sedation or respiratory depression  #10  history of apparently left forearm and heel skin lesions-this will be followed by wound care  .  Again will order daily weights notify provider if gain greater than 3 pounds-also will start potassium 30 mEq a day and recheck a BMP on Friday, March 29.  [id number] note greater than 45 minutes spent assessing patient-reviewing her chart and labs- discussing her status with nursing staff-coordinating and formulating a plan of care for numerous diagnoses-of  note greater than 50% of time spent coordinating plan of care  :

## 2018-01-12 NOTE — Telephone Encounter (Signed)
RX Fax for [facility]@ [phone]  

## 2018-01-13 ENCOUNTER — Non-Acute Institutional Stay (SKILLED_NURSING_FACILITY): Payer: Medicare Other | Admitting: Internal Medicine

## 2018-01-13 ENCOUNTER — Encounter: Payer: Self-pay | Admitting: Internal Medicine

## 2018-01-13 DIAGNOSIS — J441 Chronic obstructive pulmonary disease with (acute) exacerbation: Secondary | ICD-10-CM | POA: Diagnosis not present

## 2018-01-13 DIAGNOSIS — G232 Striatonigral degeneration: Secondary | ICD-10-CM

## 2018-01-13 DIAGNOSIS — R339 Retention of urine, unspecified: Secondary | ICD-10-CM | POA: Diagnosis not present

## 2018-01-13 DIAGNOSIS — R1312 Dysphagia, oropharyngeal phase: Secondary | ICD-10-CM

## 2018-01-13 DIAGNOSIS — I5032 Chronic diastolic (congestive) heart failure: Secondary | ICD-10-CM

## 2018-01-13 DIAGNOSIS — E782 Mixed hyperlipidemia: Secondary | ICD-10-CM

## 2018-01-13 DIAGNOSIS — F419 Anxiety disorder, unspecified: Secondary | ICD-10-CM | POA: Diagnosis not present

## 2018-01-13 DIAGNOSIS — G20C Parkinsonism, unspecified: Secondary | ICD-10-CM

## 2018-01-13 DIAGNOSIS — K219 Gastro-esophageal reflux disease without esophagitis: Secondary | ICD-10-CM | POA: Diagnosis not present

## 2018-01-13 NOTE — Progress Notes (Signed)
Provider:  Veleta Miners Location:   [facility] Room Number: 155/P Place of Service:  SNF (31)  PCP: [name], MD Patient Care Team: [name], MD as PCP - General (Internal Medicine) Dorna Leitz, MD as Consulting Physician (Orthopedic Surgery) Teena Irani, MD (Inactive) as Consulting Physician (Gastroenterology) Garrel Ridgel, DPM as Consulting Physician (Podiatry) Juanito Doom, MD as Consulting Physician (Pulmonary Disease) Alda Berthold, DO as Consulting Physician (Neurology)  Extended Emergency Contact Information Primary Emergency Contact: [facility] Address: [address]          [place], [place] [address] [place] Phone: [phone] Mobile Phone: [phone] Relation: Spouse Secondary Emergency Contact: [place] Mobile Phone: [phone] Relation: Daughter  Code Status: Full Code Goals of Care: Advanced Directive information Advanced Directives 01/13/2018  Does Patient Have a Medical Advance Directive? Yes  Type of Advance Directive (No Data)  Does patient want to make changes to medical advance directive? No - Patient declined  Copy of [place] in Chart? No - copy requested  Would patient like information on creating a medical advance directive? -  Pre-existing out of facility DNR order (yellow form or pink MOST form) -      Chief Complaint  Patient presents with  . New Admit To SNF    New Admission Visit    HPI: Patient is a 82 y.o. female seen today for admission to SNF for therapy after staying in the hospital from 03/22-03/26 For COPD Exacerbation and Volume Overload with LE Edema. Patient has a history of COPD Gold stage II with FEV1 of 69%, on as needed oxygen, diastolic heart failure due to LVH Echo in 06/18,, Parkinson disease follows with neurology, oropharyngeal dysphagia, reflux, hypertension and anxiety.  Patient has had recurrent hospital admissions due to COPD  exacerbation 12/18,02/19,03/19.  She says that she started getting short of breath at home and was using more oxygen.  She was also using albuterol every 4 hours.  She has noted some swelling in her legs.  She did not have any fever or chills.  She did have a dry cough and some chest pain. She was admitted and was treated with IV antibiotics ,, steroids and bronchodilators.  Her Lasix was increased from 40 to 60 mg.  She is also discharged on doxycycline. Patient denies any shortness of breath today.  She said that she worked with the therapy and was able to walk with her walker.  She wants to go home. Patient lives with her husband.  Is independent in ADLs.  Walks with a walker.  Past Medical History:  Diagnosis Date  . Balance disorder 2008.     Falls a lot.  She can be standing and then leans too far over to one side   . Bulging disc   . Chronic bronchitis ([place])    due to congestion at times, on prednisone and advair  . COPD (chronic obstructive pulmonary disease) ([place])   . Depression    many years ago  . GERD (gastroesophageal reflux disease)   . Hearing loss    mild  . Hyperlipidemia   . Hypertension   . Iatrogenic adrenal insufficiency ([place])   . Incontinence    not indicated at this visit.  Marland Kitchen LVH (left ventricular hypertrophy) due to hypertensive disease    a. 02/2017: echo showing an EF of 65-70% with moderate LVH, hyperdynamic LV with subvalvular gradient of 70 mmHg, SAM, and mild MR  .  Neuropathy    legs stay numb   . Osteoarthritis    hands,   . Osteoporosis   . Shortness of breath dyspnea   . Tubulovillous adenoma of rectum   . Wears dentures   . Wears glasses    Past Surgical History:  Procedure Laterality Date  . ABDOMINAL HYSTERECTOMY    . APPENDECTOMY    . CHONDROPLASTY  09/19/2014   Procedure: CHONDROPLASTY;  Surgeon: Alta Corning, MD;  Location: [facility];  Service: Orthopedics;;  . DILATION AND CURETTAGE OF UTERUS    . EXTERNAL FIXATION LEG   10/25/2012   Procedure: EXTERNAL FIXATION LEG;  Surgeon: Rozanna Box, MD;  Location: [facility];  Service: Orthopedics;  Laterality: Right;  . EYE SURGERY     bilateral cataract surgery and lens implant  . FINGER SURGERY     fusions and debridements for OA  . INCONTINENCE SURGERY     multiple procedures, not cured  . KNEE ARTHROSCOPY WITH LATERAL MENISECTOMY Right 09/19/2014   Procedure: KNEE ARTHROSCOPY WITH LATERAL MENISECTOMY;  Surgeon: Alta Corning, MD;  Location: [facility];  Service: Orthopedics;  Laterality: Right;  . KNEE ARTHROSCOPY WITH MEDIAL MENISECTOMY Right 09/19/2014   Procedure: RIGHT KNEE ARTHROSCOPY WITH MEDIAL AND LATERAL MENISECTOMIES. CHONDROPLASTY OF PATELLA-FEMORAL JOINT;  Surgeon: Alta Corning, MD;  Location: [facility];  Service: Orthopedics;  Laterality: Right;  . RECTAL BIOPSY  09/21/2011   Procedure: BIOPSY RECTAL;  Surgeon: Merrie Roof, MD;  Location: [facility];  Service: General;  Laterality: N/A;  3-4 cm  . RECTAL SURGERY     by dr. Marlou Starks, removal of polyp  . TONSILLECTOMY      reports that she has never smoked. She has never used smokeless tobacco. She reports that she does not drink alcohol or use drugs. Social History   Socioeconomic History  . Marital status: Married    Spouse name: Not on file  . Number of children: 1  . Years of education: 61  . Highest education level: Not on file  Occupational History  . Not on file  Social Needs  . Financial resource strain: Not on file  . Food insecurity:    Worry: Not on file    Inability: Not on file  . Transportation needs:    Medical: Not on file    Non-medical: Not on file  Tobacco Use  . Smoking status: Never Smoker  . Smokeless tobacco: Never Used  Substance and Sexual Activity  . Alcohol use: No  . Drug use: No  . Sexual activity: Not on file  Lifestyle  . Physical activity:    Days per week: Not on file    Minutes per session: Not on file  . Stress: Not on file    Relationships  . Social connections:    Talks on phone: Not on file    Gets together: Not on file    Attends religious service: Not on file    Active member of club or organization: Not on file    Attends meetings of clubs or organizations: Not on file    Relationship status: Not on file  . Intimate partner violence:    Fear of current or ex partner: Not on file    Emotionally abused: Not on file    Physically abused: Not on file    Forced sexual activity: Not on file  Other Topics Concern  . Not on file  Social History  Narrative   Lives in a house that is mostly single story.  She uses a cane and a walker which she uses for ambulation.  Drives.  Lives with husband.  Has one daughter.  Retired Building control surveyor.  Education: high school.    Functional Status Survey:    Family History  Problem Relation Age of Onset  . Cancer Sister        pt unaware of what kind  . High blood pressure Mother   . COPD Mother   . Heart attack Father     Health Maintenance  Topic Date Due  . TETANUS/TDAP  08/18/2023  . INFLUENZA VACCINE  Completed  . DEXA SCAN  Completed  . PNA vac Low Risk Adult  Completed    Allergies  Allergen Reactions  . Demerol  [Meperidine Hcl] Other (See Comments)    Pt. Feels like she is suffocating  . Ertapenem Hives    Caused whole body to turn red Other reaction(s): Redness Caused whole body to turn red  . Codeine Other (See Comments)    hallucinations  . Demerol Other (See Comments)    hallucinations  . Morphine And Related Other (See Comments)    hallucinations  . Other Other (See Comments)    Invan 7- pt became red all over  . Sulfate Other (See Comments)    unknown    Outpatient Encounter Medications as of 01/13/2018  Medication Sig  . albuterol (PROVENTIL) (2.5 MG/3ML) 0.083% nebulizer solution Use 1 ampule in nebulizer 4 times a day or every 4 hours as needed to rescue asthma.  . ALPRAZolam (XANAX) 0.5 MG tablet Take 1 tablet (0.5 mg total) by mouth  3 (three) times daily.  Marland Kitchen aspirin 81 MG tablet Take 81 mg by mouth daily. Buys OTC  . Balsam [place]-Castor Oil (VENELEX) OINT Apply to sacrum and bilateral buttocks q shift for prevention and prn s/p incontinence or erythema  every shift  . budesonide (PULMICORT) 0.5 MG/2ML nebulizer solution Take 2 mLs (0.5 mg total) by nebulization 2 (two) times daily. Dx-J45.909, J44.0, J20.9  . carbidopa-levodopa (SINEMET IR) 25-100 MG tablet Take 1.5 tablets by mouth 2 (two) times daily.  . Cholecalciferol (VITAMIN D) 2000 units CAPS Take 1 capsule by mouth daily.  Marland Kitchen doxycycline (VIBRA-TABS) 100 MG tablet Take 1 tablet (100 mg total) by mouth every 12 (twelve) hours for 4 days.  . ENSURE (ENSURE) Take 237 mLs by mouth daily.  . famotidine (PEPCID) 20 MG tablet Take 20 mg by mouth 2 (two) times daily.   . formoterol (PERFOROMIST) 20 MCG/2ML nebulizer solution Take 2 mLs (20 mcg total) by nebulization 2 (two) times daily.  . furosemide (LASIX) 20 MG tablet Take 3 tablets (60 mg total) by mouth daily.  Marland Kitchen olmesartan (BENICAR) 20 MG tablet Take 1 tablet daily for BP (replaces Losartan)  . omeprazole (PRILOSEC) 40 MG capsule Take 1 capsule daily for Acid Reflux  . polyethylene glycol (MIRALAX / GLYCOLAX) packet Take 17 g by mouth daily as needed for mild constipation.  . potassium chloride (K-DUR,KLOR-CON) 10 MEQ tablet Take 30 mEq by mouth daily.  . predniSONE (DELTASONE) 20 MG tablet 2 tablets by mouth daily times 1 day; then 1 tablet by mouth daily times 2 days; then half tablet by mouth daily times 3 days and stop prednisone.  Marland Kitchen PROCTOZONE-HC 2.5 % rectal cream Apply 1 application topically as needed for hemorrhoids.    No facility-administered encounter medications on file as of 01/13/2018.  Review of Systems  Constitutional: Positive for activity change, appetite change and fatigue. Negative for chills.  HENT: Negative.   Respiratory: Positive for cough and shortness of breath.   Cardiovascular:  Negative for chest pain and leg swelling.  Gastrointestinal: Negative.   Genitourinary: Negative.   Musculoskeletal: Negative.   Skin: Positive for wound.  Neurological: Positive for weakness.  Psychiatric/Behavioral: Negative.     Vitals:   01/13/18 1736  BP: (!) 90/45  Pulse: 66  Resp: 18  Temp: (!) 96.9 F (36.1 C)   There is no height or weight on file to calculate BMI. Physical Exam  Constitutional: She is oriented to person, place, and time. She appears well-developed. She appears cachectic.  HENT:  Head: Normocephalic.  Eyes: Pupils are equal, round, and reactive to light.  Neck: Neck supple.  Cardiovascular: Normal rate and normal heart sounds.  Pulmonary/Chest: Effort normal. She has rales.  Abdominal: Soft. Bowel sounds are normal. She exhibits no distension. There is no tenderness. There is no rebound.  Musculoskeletal: She exhibits no edema.  Lymphadenopathy:    She has no cervical adenopathy.  Neurological: She is alert and oriented to person, place, and time.  No Focal deficits. Has arthritis in her Hands.  Skin: Skin is warm and dry.  Psychiatric: She has a normal mood and affect. Her behavior is normal. Thought content normal.    Labs reviewed: Basic Metabolic Panel: Recent Labs    09/20/17 0755  10/28/17 1527  01/09/18 0419 01/10/18 0633 01/12/18 0749  NA  --    < >  --    < > 141 143 143  K  --    < >  --    < > 3.1* 4.1 3.5  CL  --    < >  --    < > 103 108 99*  CO2  --    < >  --    < > 29 28 31   GLUCOSE  --    < >  --    < > 138* 83 96  BUN  --    < >  --    < > 34* 34* 38*  CREATININE  --    < >  --    < > 0.97 0.96 1.04*  CALCIUM  --    < >  --    < > 8.5* 8.5* 9.2  MG 2.2  --  2.2  --   --  2.3  --    < > = values in this interval not displayed.   Liver Function Tests: Recent Labs    08/05/17 0912 12/15/17 0216 01/07/18 1722  AST 21 19 16   ALT 5* 12* 12*  ALKPHOS 57 48 49  BILITOT 0.8 1.2 1.3*  PROT 6.3* 6.0* 6.5  ALBUMIN 3.8  3.7 3.8   No results for input(s): LIPASE, AMYLASE in the last 8760 hours. No results for input(s): AMMONIA in the last 8760 hours. CBC: Recent Labs    12/20/17 1037 01/07/18 1722 01/08/18 0428 01/09/18 0419 01/12/18 0749  WBC 10.3 7.0 5.3 11.6* 8.2  NEUTROABS 7,890* 3.6  --   --  4.3  HGB 14.9 14.7 14.4 13.3 15.2*  HCT 43.9 46.9* 46.2* 42.1 48.2*  MCV 85.6 91.4 91.3 90.7 91.8  PLT 268 207 224 242 227   Cardiac Enzymes: Recent Labs    07/20/17 1050 09/20/17 0727 01/07/18 1755  TROPONINI <0.03 <0.03 <0.03   BNP: Invalid input(s): POCBNP Lab Results  Component Value Date   HGBA1C 5.4 10/28/2017   Lab Results  Component Value Date   TSH 1.87 10/28/2017   Lab Results  Component Value Date   [id number] 616 01/01/2017   No results found for: FOLATE No results found for: IRON, TIBC, FERRITIN  Imaging and Procedures obtained prior to SNF admission: No results found.  Assessment/Plan  COPD with acute exacerbation  Patient on prednisone taper On albuterol ,budesonide and Perforomist  doxycycline for 4 more days She is on oxygen which would be tapered.and then PRN She is follow-up with pulmonology History of hypertension Patient is on olmesartan  and her blood pressure today was very low. We will discontinue olmesartan for now We will decrease her Lasix back to 40 mg a day Continue to monitor the blood pressure in the facility  Chronic diastolic CHF (congestive heart failure) Patient's Lasix was increased to 60 mg but had to decrease it back to 40 mg because her blood pressure was very low in the facility. Will follow her weight daily Repeat BMP Urinary retention It seems like patient had urinary retention in the hospital She had a Foley catheter placed.  Not sure if she had a voiding trial Patient states she had no problems with voiding at home.  Will try voiding trial in the facility  Oropharyngeal dysphagia due to [place] Patient had evaluation done by  speech in the hospital and is on D3 diet  GERD without esophagitis On chronic Protonix and H2 blocker  Parkinson's Disease On Sinemet Follows with neurology  Anxiety Was discharged on Xanax as needed  Physical deconditioning Started on Therapy. Disposition Discussed with the husband and patient  She is eager to go home she states she does not think she needs that much therapy.     Family/ staff Communication:   Labs/tests ordered:

## 2018-01-14 ENCOUNTER — Encounter (HOSPITAL_COMMUNITY)
Admission: RE | Admit: 2018-01-14 | Discharge: 2018-01-14 | Disposition: A | Discharge status: Home / Self Care | Payer: Medicare Other | Source: Skilled Nursing Facility | Attending: Internal Medicine | Admitting: Internal Medicine

## 2018-01-14 ENCOUNTER — Non-Acute Institutional Stay (SKILLED_NURSING_FACILITY): Payer: Medicare Other | Admitting: Internal Medicine

## 2018-01-14 ENCOUNTER — Encounter: Payer: Self-pay | Admitting: Internal Medicine

## 2018-01-14 DIAGNOSIS — I959 Hypotension, unspecified: Secondary | ICD-10-CM | POA: Diagnosis not present

## 2018-01-14 DIAGNOSIS — I5032 Chronic diastolic (congestive) heart failure: Secondary | ICD-10-CM

## 2018-01-14 DIAGNOSIS — J9611 Chronic respiratory failure with hypoxia: Secondary | ICD-10-CM | POA: Diagnosis not present

## 2018-01-14 DIAGNOSIS — F411 Generalized anxiety disorder: Secondary | ICD-10-CM | POA: Insufficient documentation

## 2018-01-14 DIAGNOSIS — N289 Disorder of kidney and ureter, unspecified: Secondary | ICD-10-CM | POA: Diagnosis not present

## 2018-01-14 DIAGNOSIS — E785 Hyperlipidemia, unspecified: Secondary | ICD-10-CM | POA: Insufficient documentation

## 2018-01-14 DIAGNOSIS — Z9981 Dependence on supplemental oxygen: Secondary | ICD-10-CM | POA: Diagnosis not present

## 2018-01-14 DIAGNOSIS — I10 Essential (primary) hypertension: Secondary | ICD-10-CM | POA: Insufficient documentation

## 2018-01-14 DIAGNOSIS — E43 Unspecified severe protein-calorie malnutrition: Secondary | ICD-10-CM | POA: Insufficient documentation

## 2018-01-14 DIAGNOSIS — E559 Vitamin D deficiency, unspecified: Secondary | ICD-10-CM | POA: Insufficient documentation

## 2018-01-14 LAB — BASIC METABOLIC PANEL
Anion gap: 12 (ref 5–15)
BUN: 42 mg/dL — ABNORMAL HIGH (ref 6–20)
CALCIUM: 9 mg/dL (ref 8.9–10.3)
CO2: 31 mmol/L (ref 22–32)
Chloride: 99 mmol/L — ABNORMAL LOW (ref 101–111)
Creatinine, Ser: 1.31 mg/dL — ABNORMAL HIGH (ref 0.44–1.00)
GFR calc Af Amer: 44 mL/min — ABNORMAL LOW (ref >60)
GFR, EST NON AFRICAN AMERICAN: 38 mL/min — AB (ref >60)
Glucose, Bld: 87 mg/dL (ref 65–99)
POTASSIUM: 3.9 mmol/L (ref 3.5–5.1)
Sodium: 142 mmol/L (ref 135–145)

## 2018-01-14 NOTE — Progress Notes (Signed)
Location:   [facility] Room Number: 155/P Place of Service:  SNF (31) Provider:  Billey Gosling, MD  Patient Care Team: [name], MD as PCP - General (Internal Medicine) Dorna Leitz, MD as Consulting Physician (Orthopedic Surgery) Teena Irani, MD (Inactive) as Consulting Physician (Gastroenterology) Garrel Ridgel, DPM as Consulting Physician (Podiatry) Juanito Doom, MD as Consulting Physician (Pulmonary Disease) Alda Berthold, DO as Consulting Physician (Neurology)  Extended Emergency Contact Information Primary Emergency Contact: [facility] Address: [address]          [place], [place] [address] [place] Phone: [phone] Mobile Phone: [phone] Relation: Spouse Secondary Emergency Contact: Sorrel Mobile Phone: [phone] Relation: Daughter  Code Status:  Full Code Goals of care: Advanced Directive information Advanced Directives 01/14/2018  Does Patient Have a Medical Advance Directive? Yes  Type of Advance Directive (No Data)  Does patient want to make changes to medical advance directive? No - Patient declined  Copy of [place] in Chart? No - copy requested  Would patient like information on creating a medical advance directive? -  Pre-existing out of facility DNR order (yellow form or pink MOST form) -     Chief complaint-acute visit follow-up hypotension    HPI:  Pt is a 82 y.o. female seen today for an acute visit for follow-up of hypotension.  Patient is here for rehab after hospitalization for COPD exacerbation--and volume overload with lower extremity edema.  She also has a history of Parkinson's disease oropharyngeal dysphagia acid reflux hypertension and anxiety.  She was treated with IV antibiotics steroids and bronchodilators her Lasix was increased to 60 mg a day.  She was discharged on doxycycline.  When Dr. Lyndel Safe saw her yesterday her systolic  blood pressure was in the 90s- and her Benicar was discontinued- and her Lasix was decreased down to 40 mg a day.  Today I did take her manual blood pressure and it was 110/60-earlier today she has a listed blood pressure with systolic in the [id number] is not complaining of syncope or dizziness appears to be asymptomatic at this point but continues to be quite frail.  Metabolic panel also was done which shows a slight rise in her creatinine at 1.31 with a BUN of 42.  Clinically she is not complaining of any increased shortness of breath or cough beyond baseline- she continues to be on oxygen- her main complaint is that she wants to go home     Past Medical History:  Diagnosis Date  . Balance disorder 2008.     Falls a lot.  She can be standing and then leans too far over to one side   . Bulging disc   . Chronic bronchitis ([place])    due to congestion at times, on prednisone and advair  . COPD (chronic obstructive pulmonary disease) ([place])   . Depression    many years ago  . GERD (gastroesophageal reflux disease)   . Hearing loss    mild  . Hyperlipidemia   . Hypertension   . Iatrogenic adrenal insufficiency ([place])   . Incontinence    not indicated at this visit.  Marland Kitchen LVH (left ventricular hypertrophy) due to hypertensive disease    a. 02/2017: echo showing an EF of 65-70% with moderate LVH, hyperdynamic LV with subvalvular gradient of 70 mmHg, SAM, and mild MR  . Neuropathy    legs stay numb   . Osteoarthritis    hands,   .  Osteoporosis   . Shortness of breath dyspnea   . Tubulovillous adenoma of rectum   . Wears dentures   . Wears glasses    Past Surgical History:  Procedure Laterality Date  . ABDOMINAL HYSTERECTOMY    . APPENDECTOMY    . CHONDROPLASTY  09/19/2014   Procedure: CHONDROPLASTY;  Surgeon: Alta Corning, MD;  Location: [facility];  Service: Orthopedics;;  . DILATION AND CURETTAGE OF UTERUS    . EXTERNAL FIXATION LEG  10/25/2012   Procedure: EXTERNAL  FIXATION LEG;  Surgeon: Rozanna Box, MD;  Location: [facility];  Service: Orthopedics;  Laterality: Right;  . EYE SURGERY     bilateral cataract surgery and lens implant  . FINGER SURGERY     fusions and debridements for OA  . INCONTINENCE SURGERY     multiple procedures, not cured  . KNEE ARTHROSCOPY WITH LATERAL MENISECTOMY Right 09/19/2014   Procedure: KNEE ARTHROSCOPY WITH LATERAL MENISECTOMY;  Surgeon: Alta Corning, MD;  Location: [facility];  Service: Orthopedics;  Laterality: Right;  . KNEE ARTHROSCOPY WITH MEDIAL MENISECTOMY Right 09/19/2014   Procedure: RIGHT KNEE ARTHROSCOPY WITH MEDIAL AND LATERAL MENISECTOMIES. CHONDROPLASTY OF PATELLA-FEMORAL JOINT;  Surgeon: Alta Corning, MD;  Location: [facility];  Service: Orthopedics;  Laterality: Right;  . RECTAL BIOPSY  09/21/2011   Procedure: BIOPSY RECTAL;  Surgeon: Merrie Roof, MD;  Location: [facility];  Service: General;  Laterality: N/A;  3-4 cm  . RECTAL SURGERY     by dr. Marlou Starks, removal of polyp  . TONSILLECTOMY      Allergies  Allergen Reactions  . Demerol  [Meperidine Hcl] Other (See Comments)    Pt. Feels like she is suffocating  . Ertapenem Hives    Caused whole body to turn red Other reaction(s): Redness Caused whole body to turn red  . Codeine Other (See Comments)    hallucinations  . Demerol Other (See Comments)    hallucinations  . Morphine And Related Other (See Comments)    hallucinations  . Other Other (See Comments)    Invan 7- pt became red all over  . Sulfate Other (See Comments)    unknown    Outpatient Encounter Medications as of 01/14/2018  Medication Sig  . albuterol (PROVENTIL) (2.5 MG/3ML) 0.083% nebulizer solution Use 1 ampule in nebulizer 4 times a day or every 4 hours as needed to rescue asthma.  . ALPRAZolam (XANAX) 0.5 MG tablet Take 1 tablet (0.5 mg total) by mouth 3 (three) times daily.  Marland Kitchen aspirin 81 MG tablet Take 81 mg by mouth daily. Buys OTC  . Balsam  [place]-Castor Oil (VENELEX) OINT Apply to sacrum and bilateral buttocks q shift for prevention and prn s/p incontinence or erythema  every shift  . budesonide (PULMICORT) 0.5 MG/2ML nebulizer solution Take 2 mLs (0.5 mg total) by nebulization 2 (two) times daily. Dx-J45.909, J44.0, J20.9  . carbidopa-levodopa (SINEMET IR) 25-100 MG tablet Take 1.5 tablets by mouth 2 (two) times daily.  . Cholecalciferol (VITAMIN D) 2000 units CAPS Take 1 capsule by mouth daily.  Marland Kitchen doxycycline (VIBRA-TABS) 100 MG tablet Take 1 tablet (100 mg total) by mouth every 12 (twelve) hours for 4 days.  . ENSURE (ENSURE) Take 237 mLs by mouth daily.  . famotidine (PEPCID) 20 MG tablet Take 20 mg by mouth 2 (two) times daily.   . formoterol (PERFOROMIST) 20 MCG/2ML nebulizer solution Take 2 mLs (20 mcg total) by nebulization 2 (two)  times daily.  . furosemide (LASIX) 20 MG tablet Take 40 mg by mouth daily.  Marland Kitchen nystatin (MYCOSTATIN) 100000 UNIT/ML suspension Take 5 mLs by mouth 3 (three) times daily. Swish and spit three times day from 01/14/2018-01/18/2018  . omeprazole (PRILOSEC) 40 MG capsule Take 1 capsule daily for Acid Reflux  . polyethylene glycol (MIRALAX / GLYCOLAX) packet Take 17 g by mouth daily as needed for mild constipation.  . potassium chloride (K-DUR,KLOR-CON) 10 MEQ tablet Take 30 mEq by mouth daily.  . predniSONE (DELTASONE) 20 MG tablet 2 tablets by mouth daily times 1 day; then 1 tablet by mouth daily times 2 days; then half tablet by mouth daily times 3 days and stop prednisone.  Marland Kitchen PROCTOZONE-HC 2.5 % rectal cream Apply 1 application topically as needed for hemorrhoids.   . [DISCONTINUED] furosemide (LASIX) 20 MG tablet Take 3 tablets (60 mg total) by mouth daily. (Patient taking differently: Take 40 mg by mouth daily. )  . [DISCONTINUED] olmesartan (BENICAR) 20 MG tablet Take 1 tablet daily for BP (replaces Losartan)   No facility-administered encounter medications on file as of 01/14/2018.     Review of  Systems   In general she is not complaining of fever chills is still weak.  Skin is not complaining of rashes or itching or diaphoresis.  She does have bilateral heel lesions which are currently covered  Head ears eyes nose mouth and throat is not complaining of any sore throat or visual changes.  Respiratory does not complain of increased cough beyond baseline still has some cough-is not complaining of acute shortness of breath does have some baseline shortness of breath.  Cardiac is not complaining of chest pain does not appear to have significant lower extremity edema.  GI is not complaining of abdominal pain nausea vomiting diarrhea constipation.  Musculoskeletal has weakness does not complain of joint pain at this time.  Neurologic does not complain of dizziness headache or syncope does have weakness.  And psych does not complain of being overtly depressed or anxious  Immunization History  Administered Date(s) Administered  . Influenza, High Dose Seasonal PF 08/23/2014, 07/24/2015, 08/13/2016, 07/12/2017  . Pneumococcal Conjugate-13 08/23/2014  . Pneumococcal-Unspecified 07/20/2003, 08/17/2013  . Td 08/17/2013  . Tdap 10/25/2012  . Zoster 03/19/2015   Pertinent  Health Maintenance Due  Topic Date Due  . INFLUENZA VACCINE  Completed  . DEXA SCAN  Completed  . PNA vac Low Risk Adult  Completed   Fall Risk  11/18/2017 10/28/2017 10/20/2017 10/04/2017 06/11/2017  Falls in the past year? No Yes No No No  Number falls in past yr: - 2 or more - - -  Injury with Fall? No No - - -  Risk Factor Category  - - - - -  Comment - - - - -  Risk for fall due to : History of fall(s);Impaired balance/gait;Impaired mobility Impaired balance/gait;History of fall(s);Impaired mobility History of fall(s);Impaired balance/gait;Impaired mobility - -  Risk for fall due to: Comment - - - - -  Follow up Falls evaluation completed Falls evaluation completed;Falls prevention discussed - - -    Functional Status Survey:    Vitals:   01/14/18 1128 01/14/18 1153  BP: (!) 98/48 (!) 93/54  Pulse: 64 74  Resp: 20 20  Temp: 98 F (36.7 C) (!) 97.3 F (36.3 C)  TempSrc: Oral Oral  SpO2: 97%   Of note manual blood pressure was 110/60 Physical Exam    in general this is a pleasant frail  elderly female in no distress lying comfortably in bed.  Her skin is warm and dry.  She does have covering of her heels bilaterally as well as left lower arm.  Eyes pupils appear to be reactive sclera and conjunctive are clear visual acuity appears to be grossly intact.  Chest she has shallow air entry could not really appreciate any overt congestion other than some minimal rales  Heart is regular rate and rhythm she does not have significant lower extremity edema.  Abdomen is soft nontender with positive bowel sounds.  Musculoskeletal does move all extremities x4 has generalized weakness with frailty with arthritic changes diffuse.  Neurologic is grossly intact her speech is clear no lateralizing findings.  Psych she is alert and oriented very pleasant and appropriate   Labs reviewed: Recent Labs    09/20/17 0755  10/28/17 1527  01/10/18 0633 01/12/18 0749 01/14/18 0713  NA  --    < >  --    < > 143 143 142  K  --    < >  --    < > 4.1 3.5 3.9  CL  --    < >  --    < > 108 99* 99*  CO2  --    < >  --    < > 28 31 31   GLUCOSE  --    < >  --    < > 83 96 87  BUN  --    < >  --    < > 34* 38* 42*  CREATININE  --    < >  --    < > 0.96 1.04* 1.31*  CALCIUM  --    < >  --    < > 8.5* 9.2 9.0  MG 2.2  --  2.2  --  2.3  --   --    < > = values in this interval not displayed.   Recent Labs    08/05/17 0912 12/15/17 0216 01/07/18 1722  AST 21 19 16   ALT 5* 12* 12*  ALKPHOS 57 48 49  BILITOT 0.8 1.2 1.3*  PROT 6.3* 6.0* 6.5  ALBUMIN 3.8 3.7 3.8   Recent Labs    12/20/17 1037 01/07/18 1722 01/08/18 0428 01/09/18 0419 01/12/18 0749  WBC 10.3 7.0 5.3 11.6* 8.2   NEUTROABS 7,890* 3.6  --   --  4.3  HGB 14.9 14.7 14.4 13.3 15.2*  HCT 43.9 46.9* 46.2* 42.1 48.2*  MCV 85.6 91.4 91.3 90.7 91.8  PLT 268 207 224 242 227   Lab Results  Component Value Date   TSH 1.87 10/28/2017   Lab Results  Component Value Date   HGBA1C 5.4 10/28/2017   Lab Results  Component Value Date   CHOL 138 05/25/2017   HDL 56 05/25/2017   LDLCALC 61 05/25/2017   TRIG 103 05/25/2017   CHOLHDL 2.5 05/25/2017    Significant Diagnostic Results in last 30 days:  Dg Chest Port 1 View  Result Date: 01/07/2018 CLINICAL DATA:  Worsening shortness of breath EXAM: PORTABLE CHEST 1 VIEW COMPARISON:  12/15/2017, 10/20/2016 FINDINGS: Mild chronic bronchitic changes. No consolidation or effusion. Stable cardiomediastinal silhouette with aortic atherosclerosis. No pneumothorax. IMPRESSION: No active disease. Electronically Signed   By: Donavan Foil M.D.   On: 01/07/2018 17:44    Assessment/Plan  1.  Hypotension-blood pressure still continues to be somewhat low under 601 systolically at times-this was discussed with Dr. Lyndel Safe and will discontinue her Lasix  for now-continue to monitor her weight closely clinically in regards to volume overload--- diastolic CHF- she appears to be stable with no evidence of edema but this will have to be watched.--Also will adjust her potassium at one point it was low I suspect this was secondary to Lasix-.  Will update a metabolic panel first laboratory day next week.  2.  History of creatinine rising-mild renal insufficiency-again her Lasix will be held which I suspect will help with this again will update her labs on Monday.  3.  COPD-at this point she appears to be stable although fragile continues on oxygen-has albuterol as needed- as well as budesonide twice a day- she is also completing a course of doxycycline today.  [id number]

## 2018-01-16 ENCOUNTER — Encounter: Payer: Self-pay | Admitting: Internal Medicine

## 2018-01-17 ENCOUNTER — Encounter (HOSPITAL_COMMUNITY)
Admission: RE | Admit: 2018-01-17 | Discharge: 2018-01-17 | Disposition: A | Discharge status: Home / Self Care | Payer: Medicare Other | Source: Skilled Nursing Facility | Attending: Internal Medicine | Admitting: Internal Medicine

## 2018-01-17 ENCOUNTER — Ambulatory Visit (INDEPENDENT_AMBULATORY_CARE_PROVIDER_SITE_OTHER): Payer: Medicare Other | Admitting: Adult Health

## 2018-01-17 ENCOUNTER — Encounter: Payer: Self-pay | Admitting: Adult Health

## 2018-01-17 DIAGNOSIS — J4541 Moderate persistent asthma with (acute) exacerbation: Secondary | ICD-10-CM

## 2018-01-17 DIAGNOSIS — E43 Unspecified severe protein-calorie malnutrition: Secondary | ICD-10-CM | POA: Insufficient documentation

## 2018-01-17 DIAGNOSIS — E559 Vitamin D deficiency, unspecified: Secondary | ICD-10-CM | POA: Insufficient documentation

## 2018-01-17 DIAGNOSIS — R1312 Dysphagia, oropharyngeal phase: Secondary | ICD-10-CM

## 2018-01-17 DIAGNOSIS — I5032 Chronic diastolic (congestive) heart failure: Secondary | ICD-10-CM | POA: Insufficient documentation

## 2018-01-17 DIAGNOSIS — F411 Generalized anxiety disorder: Secondary | ICD-10-CM | POA: Insufficient documentation

## 2018-01-17 DIAGNOSIS — I10 Essential (primary) hypertension: Secondary | ICD-10-CM | POA: Insufficient documentation

## 2018-01-17 DIAGNOSIS — J45909 Unspecified asthma, uncomplicated: Secondary | ICD-10-CM | POA: Insufficient documentation

## 2018-01-17 DIAGNOSIS — E785 Hyperlipidemia, unspecified: Secondary | ICD-10-CM | POA: Insufficient documentation

## 2018-01-17 DIAGNOSIS — G20 Parkinson's disease: Secondary | ICD-10-CM | POA: Insufficient documentation

## 2018-01-17 LAB — BASIC METABOLIC PANEL
ANION GAP: 10 (ref 5–15)
BUN: 29 mg/dL — ABNORMAL HIGH (ref 6–20)
CALCIUM: 8.9 mg/dL (ref 8.9–10.3)
CO2: 26 mmol/L (ref 22–32)
Chloride: 103 mmol/L (ref 101–111)
Creatinine, Ser: 0.95 mg/dL (ref 0.44–1.00)
GFR calc Af Amer: >60 mL/min (ref >60)
GFR calc non Af Amer: 55 mL/min — ABNORMAL LOW (ref >60)
GLUCOSE: 79 mg/dL (ref 65–99)
Potassium: 4.8 mmol/L (ref 3.5–5.1)
Sodium: 139 mmol/L (ref 135–145)

## 2018-01-17 NOTE — Progress Notes (Signed)
@Patient  ID: Marie [name], female    DOB: [birth year], 82 y.o.   MRN: [id number]  Chief Complaint  Patient presents with  . Follow-up    asthma     Referring provider: [name], MD  HPI: 82 year old female never smoker followed for restrictive lung disease and Asthma  Has medical history significant for Parkinson's plus syndrome  TEST  Lung function testing: February 2018 for pulmonary function tests: Ratio 83%, FEV1 1.24 L 65% predicted, FVC 1.50 L 58% predicted, some change in small airways with bronchodilator, total lung capacity 3.31 L 67% predicted, ERV 13% predicted, DLCO 10.5 946% predicted September 2018 ratio normal, FVC 2.01 L 79% predicted, total lung capacity 2.89 L 59% predicted, DLCO 18.78 81% predicted  Imaging: CT chest in 2018 images personally reviewed showing no evidence of a pulmonary parenchymal abnormality with the exception of what appears to be mucus plugging in a central left lower lobe airway, but no other surrounding atelectasis or mass.  Other testing: Barium swallow 11/2016 IMPRESSION:1. Markedly delayed oral transit. 2. Premature spill with thin liquid and flash penetration with cough. No aspiration.  01/17/2018 Follow up : Restrictive lung disease ,  She returns for a follow-up.  She was recently hospitalized last week for a COPD exacerbation.  Patient is a never smoker and has been felt to have possible underlying asthma along with restrictive lung disease due to her muscle weakness with her Parkinson's plus syndrome. Along with dysphagia .  During her hospitalization she was treated with IV antibiotics, steroids and nebulized bronchodilators.  She was felt to have a component of volume overload.  And was continued on Lasix.  Since discharge patient is feeling she was discharged to a rehab center.  She was seen by speech pathology and felt to have some underlying dysphagia and recommended on a dysphagia 3 diet with thin liquids. Patient says she  is feeling better.  She is tapering off her prednisone. She is unsure if she should stop or not. PCP has had her on low dose.  She remains on Pulmicort and Perforomist nebulizers twice daily. Hypertonic nebs were stopped.  She is currently in rehab and hopes to finish soon and go home .  She has had 4 hospital admits over last 6 months for COPD exacerbation    Allergies  Allergen Reactions  . Demerol  [Meperidine Hcl] Other (See Comments)    Pt. Feels like she is suffocating  . Ertapenem Hives    Caused whole body to turn red Other reaction(s): Redness Caused whole body to turn red  . Codeine Other (See Comments)    hallucinations  . Demerol Other (See Comments)    hallucinations  . Morphine And Related Other (See Comments)    hallucinations  . Other Other (See Comments)    Invan 7- pt became red all over  . Sulfate Other (See Comments)    unknown    Immunization History  Administered Date(s) Administered  . Influenza, High Dose Seasonal PF 08/23/2014, 07/24/2015, 08/13/2016, 07/12/2017  . Pneumococcal Conjugate-13 08/23/2014  . Pneumococcal-Unspecified 07/20/2003, 08/17/2013  . Td 08/17/2013  . Tdap 10/25/2012  . Zoster 03/19/2015    Past Medical History:  Diagnosis Date  . Balance disorder 2008.     Falls a lot.  She can be standing and then leans too far over to one side   . Bulging disc   . Chronic bronchitis ([place])    due to congestion at times, on prednisone and advair  .  COPD (chronic obstructive pulmonary disease) ([place])   . Depression    many years ago  . GERD (gastroesophageal reflux disease)   . Hearing loss    mild  . Hyperlipidemia   . Hypertension   . Iatrogenic adrenal insufficiency ([place])   . Incontinence    not indicated at this visit.  Marland Kitchen LVH (left ventricular hypertrophy) due to hypertensive disease    a. 02/2017: echo showing an EF of 65-70% with moderate LVH, hyperdynamic LV with subvalvular gradient of 70 mmHg, SAM, and mild MR  . Neuropathy      legs stay numb   . Osteoarthritis    hands,   . Osteoporosis   . Shortness of breath dyspnea   . Tubulovillous adenoma of rectum   . Wears dentures   . Wears glasses     Tobacco History: Social History   Tobacco Use  Smoking Status Never Smoker  Smokeless Tobacco Never Used   Counseling given: Not Answered   Outpatient Encounter Medications as of 01/17/2018  Medication Sig  . albuterol (PROVENTIL) (2.5 MG/3ML) 0.083% nebulizer solution Use 1 ampule in nebulizer 4 times a day or every 4 hours as needed to rescue asthma.  . ALPRAZolam (XANAX) 0.5 MG tablet Take 1 tablet (0.5 mg total) by mouth 3 (three) times daily.  Marland Kitchen aspirin 81 MG tablet Take 81 mg by mouth daily. Buys OTC  . Balsam [place]-Castor Oil (VENELEX) OINT Apply to sacrum and bilateral buttocks q shift for prevention and prn s/p incontinence or erythema  every shift  . budesonide (PULMICORT) 0.5 MG/2ML nebulizer solution Take 2 mLs (0.5 mg total) by nebulization 2 (two) times daily. Dx-J45.909, J44.0, J20.9  . carbidopa-levodopa (SINEMET IR) 25-100 MG tablet Take 1.5 tablets by mouth 2 (two) times daily.  . Cholecalciferol (VITAMIN D) 2000 units CAPS Take 1 capsule by mouth daily.  Marland Kitchen ENSURE (ENSURE) Take 237 mLs by mouth daily.  . famotidine (PEPCID) 20 MG tablet Take 20 mg by mouth 2 (two) times daily.   . formoterol (PERFOROMIST) 20 MCG/2ML nebulizer solution Take 2 mLs (20 mcg total) by nebulization 2 (two) times daily.  . furosemide (LASIX) 20 MG tablet Take 40 mg by mouth daily.  Marland Kitchen nystatin (MYCOSTATIN) 100000 UNIT/ML suspension Take 5 mLs by mouth 3 (three) times daily. Swish and spit three times day from 01/14/2018-01/18/2018  . omeprazole (PRILOSEC) 40 MG capsule Take 1 capsule daily for Acid Reflux  . polyethylene glycol (MIRALAX / GLYCOLAX) packet Take 17 g by mouth daily as needed for mild constipation.  . potassium chloride (K-DUR,KLOR-CON) 10 MEQ tablet Take 30 mEq by mouth daily.  . predniSONE (DELTASONE) 20 MG  tablet 2 tablets by mouth daily times 1 day; then 1 tablet by mouth daily times 2 days; then half tablet by mouth daily times 3 days and stop prednisone.  Marland Kitchen PROCTOZONE-HC 2.5 % rectal cream Apply 1 application topically as needed for hemorrhoids.    No facility-administered encounter medications on file as of 01/17/2018.      Review of Systems  Constitutional:   No  weight loss, night sweats,  Fevers, chills, + fatigue, or  lassitude.  HEENT:   No headaches,  Difficulty swallowing,  Tooth/dental problems, or  Sore throat,                No sneezing, itching, ear ache,  +nasal congestion, post nasal drip,   CV:  No chest pain,  Orthopnea, PND, swelling in lower extremities, anasarca, dizziness, palpitations, syncope.  GI  No heartburn, indigestion, abdominal pain, nausea, vomiting, diarrhea, change in bowel habits, loss of appetite, bloody stools.   Resp:   No chest wall deformity  Skin: no rash or lesions.  GU: no dysuria, change in color of urine, no urgency or frequency.  No flank pain, no hematuria   MS:  No joint pain or swelling.  No decreased range of motion.  No back pain.    Physical Exam  BP (!) 100/56 (BP Location: Right Arm, Cuff Size: Small)   Pulse 72   Ht 5\' 3"  (1.6 m)   Wt 129 lb 9.6 oz (58.8 kg)   SpO2 94%   BMI 22.96 kg/m   GEN: A/Ox3; pleasant , NAD, frail and elderly    HEENT:  [place]/AT,  EACs-clear, TMs-wnl, NOSE-clear, THROAT-clear, no lesions, no postnasal drip or exudate noted.   NECK:  Supple w/ fair ROM; no JVD; normal carotid impulses w/o bruits; no thyromegaly or nodules palpated; no lymphadenopathy.    RESP  Clear  P & A; w/o, wheezes/ rales/ or rhonchi. no accessory muscle use, no dullness to percussion  CARD:  RRR, no m/r/g, no peripheral edema, pulses intact, no cyanosis or clubbing.  GI:   Soft & nt; nml bowel sounds; no organomegaly or masses detected.   Musco: Warm bil, no deformities or joint swelling noted. Deconditioned   Neuro:  alert, no focal deficits noted.    Skin: Warm, no lesions or rashes    Lab Results:  CBC   BMET    Imaging: Dg Chest Port 1 View  Result Date: 01/07/2018 CLINICAL DATA:  Worsening shortness of breath EXAM: PORTABLE CHEST 1 VIEW COMPARISON:  12/15/2017, 10/20/2016 FINDINGS: Mild chronic bronchitic changes. No consolidation or effusion. Stable cardiomediastinal silhouette with aortic atherosclerosis. No pneumothorax. IMPRESSION: No active disease. Electronically Signed   By: Donavan Foil M.D.   On: 01/07/2018 17:44     Assessment & Plan:   No problem-specific Assessment & Plan notes found for this encounter.     Rexene Edison, NP 01/17/2018

## 2018-01-17 NOTE — Assessment & Plan Note (Signed)
Recurrent flare now resolving   Plan  Patient Instructions  Continue on Perforomist and Pulmicort Neb Twice daily   Discuss with Primary MD regarding prednisone dose.  Continue on Aspiration precautions  Follow up with Dr. [place] Bells in 6-8 weeks and As needed   Please contact office for sooner follow up if symptoms do not improve or worsen or seek emergency care

## 2018-01-17 NOTE — Assessment & Plan Note (Signed)
Cont with aspiration precautions

## 2018-01-17 NOTE — Patient Instructions (Signed)
Continue on Perforomist and Pulmicort Neb Twice daily   Discuss with Primary MD regarding prednisone dose.  Continue on Aspiration precautions  Follow up with Dr. [place] Bells in 6-8 weeks and As needed   Please contact office for sooner follow up if symptoms do not improve or worsen or seek emergency care

## 2018-01-18 ENCOUNTER — Telehealth: Payer: Self-pay | Admitting: *Deleted

## 2018-01-18 ENCOUNTER — Non-Acute Institutional Stay (SKILLED_NURSING_FACILITY): Payer: Medicare Other | Admitting: Internal Medicine

## 2018-01-18 ENCOUNTER — Encounter: Payer: Self-pay | Admitting: Internal Medicine

## 2018-01-18 ENCOUNTER — Encounter (HOSPITAL_COMMUNITY)
Admission: RE | Admit: 2018-01-18 | Discharge: 2018-01-18 | Disposition: A | Discharge status: Home / Self Care | Payer: Medicare Other | Source: Skilled Nursing Facility | Attending: Internal Medicine | Admitting: Internal Medicine

## 2018-01-18 DIAGNOSIS — I5032 Chronic diastolic (congestive) heart failure: Secondary | ICD-10-CM

## 2018-01-18 DIAGNOSIS — Z9981 Dependence on supplemental oxygen: Secondary | ICD-10-CM | POA: Diagnosis not present

## 2018-01-18 DIAGNOSIS — G232 Striatonigral degeneration: Secondary | ICD-10-CM

## 2018-01-18 DIAGNOSIS — J441 Chronic obstructive pulmonary disease with (acute) exacerbation: Secondary | ICD-10-CM | POA: Diagnosis not present

## 2018-01-18 DIAGNOSIS — J984 Other disorders of lung: Secondary | ICD-10-CM | POA: Diagnosis not present

## 2018-01-18 DIAGNOSIS — J9611 Chronic respiratory failure with hypoxia: Secondary | ICD-10-CM | POA: Diagnosis not present

## 2018-01-18 LAB — BASIC METABOLIC PANEL
ANION GAP: 11 (ref 5–15)
BUN: 25 mg/dL — ABNORMAL HIGH (ref 6–20)
CALCIUM: 8.9 mg/dL (ref 8.9–10.3)
CO2: 26 mmol/L (ref 22–32)
CREATININE: 1 mg/dL (ref 0.44–1.00)
Chloride: 103 mmol/L (ref 101–111)
GFR, EST NON AFRICAN AMERICAN: 52 mL/min — AB (ref >60)
GLUCOSE: 87 mg/dL (ref 65–99)
Potassium: 4.6 mmol/L (ref 3.5–5.1)
Sodium: 140 mmol/L (ref 135–145)

## 2018-01-18 NOTE — Progress Notes (Signed)
Location:   [facility] Room Number: 155/P Place of Service:  SNF (31) Provider:  Leafy Ro, MD  Patient Care Team: [name], MD as PCP - General (Internal Medicine) Dorna Leitz, MD as Consulting Physician (Orthopedic Surgery) Teena Irani, MD (Inactive) as Consulting Physician (Gastroenterology) Garrel Ridgel, DPM as Consulting Physician (Podiatry) Juanito Doom, MD as Consulting Physician (Pulmonary Disease) Alda Berthold, DO as Consulting Physician (Neurology)  Extended Emergency Contact Information Primary Emergency Contact: [facility] Address: [address]          [place], [place] [address] [place] Phone: [phone] Mobile Phone: [phone] Relation: Spouse Secondary Emergency Contact: [place] Mobile Phone: [phone] Relation: Daughter  Code Status:  DNR Goals of care: Advanced Directive information Advanced Directives 01/18/2018  Does Patient Have a Medical Advance Directive? Yes  Type of Advance Directive Out of facility DNR (pink MOST or yellow form)  Does patient want to make changes to medical advance directive? No - Patient declined  Copy of [place] in Chart? No - copy requested  Would patient like information on creating a medical advance directive? -  Pre-existing out of facility DNR order (yellow form or pink MOST form) -     Chief Complaint  Patient presents with  . Acute Visit    Patients Follow up for Weight gain and Hypotension.    HPI:  Pt is a 82 y.o. female seen today for an acute visit for Weight gain of 4 lbs per Nurses. Patient was admitted  to SNF for therapy after staying in the hospital from 03/22-03/26 For COPD Exacerbation and Volume Overload with LE Edema. Patient has a history of COPD Gold stage II with FEV1 of 69%, on as needed oxygen, diastolic heart failure due to LVH Echo in 06/18,, Parkinson disease follows with neurology,  oropharyngeal dysphagia, reflux, hypertension and anxiety. Patient has had recurrent hospital admissions due to COPD exacerbation 12/18,02/19,03/19  Patient came to the facility her blood pressure was running very low.  She had some systolics of 25-95.  Her olmesartan was discontinued.  And her Lasix was put on hold.  Since then patient has done well.  Her blood pressure has stabilized.  She is working with therapy.  Still continues to complain of shortness of breath. There was some concern that she has gained 4 pounds in the past week and is up to 124 lbs Patient denies any worsening of her  swelling or SOB.. Denies any Cough or chest pain. Patient wants to know when she can go home  Past Medical History:  Diagnosis Date  . Balance disorder 2008.     Falls a lot.  She can be standing and then leans too far over to one side   . Bulging disc   . Chronic bronchitis ([place])    due to congestion at times, on prednisone and advair  . COPD (chronic obstructive pulmonary disease) ([place])   . Depression    many years ago  . GERD (gastroesophageal reflux disease)   . Hearing loss    mild  . Hyperlipidemia   . Hypertension   . Iatrogenic adrenal insufficiency ([place])   . Incontinence    not indicated at this visit.  Marland Kitchen LVH (left ventricular hypertrophy) due to hypertensive disease    a. 02/2017: echo showing an EF of 65-70% with moderate LVH, hyperdynamic LV with subvalvular gradient of 70 mmHg, SAM, and mild MR  . Neuropathy  legs stay numb   . Osteoarthritis    hands,   . Osteoporosis   . Shortness of breath dyspnea   . Tubulovillous adenoma of rectum   . Wears dentures   . Wears glasses    Past Surgical History:  Procedure Laterality Date  . ABDOMINAL HYSTERECTOMY    . APPENDECTOMY    . CHONDROPLASTY  09/19/2014   Procedure: CHONDROPLASTY;  Surgeon: Alta Corning, MD;  Location: [facility];  Service: Orthopedics;;  . DILATION AND CURETTAGE OF UTERUS    . EXTERNAL  FIXATION LEG  10/25/2012   Procedure: EXTERNAL FIXATION LEG;  Surgeon: Rozanna Box, MD;  Location: [facility];  Service: Orthopedics;  Laterality: Right;  . EYE SURGERY     bilateral cataract surgery and lens implant  . FINGER SURGERY     fusions and debridements for OA  . INCONTINENCE SURGERY     multiple procedures, not cured  . KNEE ARTHROSCOPY WITH LATERAL MENISECTOMY Right 09/19/2014   Procedure: KNEE ARTHROSCOPY WITH LATERAL MENISECTOMY;  Surgeon: Alta Corning, MD;  Location: [facility];  Service: Orthopedics;  Laterality: Right;  . KNEE ARTHROSCOPY WITH MEDIAL MENISECTOMY Right 09/19/2014   Procedure: RIGHT KNEE ARTHROSCOPY WITH MEDIAL AND LATERAL MENISECTOMIES. CHONDROPLASTY OF PATELLA-FEMORAL JOINT;  Surgeon: Alta Corning, MD;  Location: [facility];  Service: Orthopedics;  Laterality: Right;  . RECTAL BIOPSY  09/21/2011   Procedure: BIOPSY RECTAL;  Surgeon: Merrie Roof, MD;  Location: [facility];  Service: General;  Laterality: N/A;  3-4 cm  . RECTAL SURGERY     by dr. Marlou Starks, removal of polyp  . TONSILLECTOMY      Allergies  Allergen Reactions  . Demerol  [Meperidine Hcl] Other (See Comments)    Pt. Feels like she is suffocating  . Ertapenem Hives    Caused whole body to turn red Other reaction(s): Redness Caused whole body to turn red  . Codeine Other (See Comments)    hallucinations  . Demerol Other (See Comments)    hallucinations  . Morphine And Related Other (See Comments)    hallucinations  . Other Other (See Comments)    Invan 7- pt became red all over  . Sulfate Other (See Comments)    unknown    Outpatient Encounter Medications as of 01/18/2018  Medication Sig  . albuterol (PROVENTIL) (2.5 MG/3ML) 0.083% nebulizer solution Use 1 ampule in nebulizer 4 times a day or every 4 hours as needed to rescue asthma.  . ALPRAZolam (XANAX) 0.5 MG tablet Take 1 tablet (0.5 mg total) by mouth 3 (three) times daily.  Marland Kitchen aspirin 81 MG tablet Take 81  mg by mouth daily. Buys OTC  . Balsam [place]-Castor Oil (VENELEX) OINT Apply to sacrum and bilateral buttocks q shift for prevention and prn s/p incontinence or erythema  every shift  . budesonide (PULMICORT) 0.5 MG/2ML nebulizer solution Take 2 mLs (0.5 mg total) by nebulization 2 (two) times daily. Dx-J45.909, J44.0, J20.9  . carbidopa-levodopa (SINEMET IR) 25-100 MG tablet Take 1.5 tablets by mouth 2 (two) times daily.  . Cholecalciferol (VITAMIN D) 2000 units CAPS Take 1 capsule by mouth daily.  Marland Kitchen ENSURE (ENSURE) Take 237 mLs by mouth daily.  . famotidine (PEPCID) 20 MG tablet Take 20 mg by mouth 2 (two) times daily.   . formoterol (PERFOROMIST) 20 MCG/2ML nebulizer solution Take 2 mLs (20 mcg total) by nebulization 2 (two) times daily.  Marland Kitchen nystatin (MYCOSTATIN) 100000 UNIT/ML  suspension Take 5 mLs by mouth 3 (three) times daily. Swish and spit three times day from 01/14/2018-01/18/2018  . omeprazole (PRILOSEC) 40 MG capsule Take 1 capsule daily for Acid Reflux  . polyethylene glycol (MIRALAX / GLYCOLAX) packet Take 17 g by mouth daily as needed for mild constipation.  Marland Kitchen PROCTOZONE-HC 2.5 % rectal cream Apply 1 application topically as needed for hemorrhoids.   . [DISCONTINUED] furosemide (LASIX) 20 MG tablet Take 40 mg by mouth daily.  . [DISCONTINUED] potassium chloride (K-DUR,KLOR-CON) 10 MEQ tablet Take 30 mEq by mouth daily.  . [DISCONTINUED] predniSONE (DELTASONE) 20 MG tablet 2 tablets by mouth daily times 1 day; then 1 tablet by mouth daily times 2 days; then half tablet by mouth daily times 3 days and stop prednisone.   No facility-administered encounter medications on file as of 01/18/2018.      Review of Systems  Review of Systems  Constitutional: Negative for activity change, appetite change, chills, diaphoresis, fatigue and fever.  HENT: Negative for mouth sores, postnasal drip, rhinorrhea, sinus pain and sore throat.   Respiratory: Negative for apnea, cough, chest tightness,  shortness of breath and wheezing.   Cardiovascular: Negative for chest pain, palpitations and leg swelling.  Gastrointestinal: Negative for abdominal distention, abdominal pain, constipation, diarrhea, nausea and vomiting.  Genitourinary: Negative for dysuria and frequency.  Musculoskeletal: Negative for arthralgias, joint swelling and myalgias.  Skin: Negative for rash.  Neurological: Negative for dizziness, syncope, weakness, light-headedness and numbness.  Psychiatric/Behavioral: Negative for behavioral problems, confusion and sleep disturbance.     Immunization History  Administered Date(s) Administered  . Influenza, High Dose Seasonal PF 08/23/2014, 07/24/2015, 08/13/2016, 07/12/2017  . Pneumococcal Conjugate-13 08/23/2014  . Pneumococcal-Unspecified 07/20/2003, 08/17/2013  . Td 08/17/2013  . Tdap 10/25/2012  . Zoster 03/19/2015   Pertinent  Health Maintenance Due  Topic Date Due  . INFLUENZA VACCINE  05/19/2018  . DEXA SCAN  Completed  . PNA vac Low Risk Adult  Completed   Fall Risk  11/18/2017 10/28/2017 10/20/2017 10/04/2017 06/11/2017  Falls in the past year? No Yes No No No  Number falls in past yr: - 2 or more - - -  Injury with Fall? No No - - -  Risk Factor Category  - - - - -  Comment - - - - -  Risk for fall due to : History of fall(s);Impaired balance/gait;Impaired mobility Impaired balance/gait;History of fall(s);Impaired mobility History of fall(s);Impaired balance/gait;Impaired mobility - -  Risk for fall due to: Comment - - - - -  Follow up Falls evaluation completed Falls evaluation completed;Falls prevention discussed - - -   Functional Status Survey:    Vitals:   01/18/18 1250  BP: 105/60  Pulse: 63  Resp: 20  Temp: 98 F (36.7 C)  TempSrc: Oral  SpO2: 97%   There is no height or weight on file to calculate BMI. Physical Exam  Constitutional: She appears well-developed.  HENT:  Head: Normocephalic.  Mouth/Throat: Oropharynx is clear and moist.    Eyes: Pupils are equal, round, and reactive to light.  Neck: Neck supple.  Cardiovascular: Normal rate.  Murmur heard. Pulmonary/Chest: Effort normal and breath sounds normal. No respiratory distress. She has no wheezes. She has no rales.  Abdominal: Soft. Bowel sounds are normal. She exhibits no distension. There is no tenderness. There is no rebound.  Musculoskeletal:  Trace edema bilateral  Neurological: She is alert.  Skin: Skin is warm.  Psychiatric: She has a normal mood and affect. Her  behavior is normal.    Labs reviewed: Recent Labs    09/20/17 0755  10/28/17 1527  01/10/18 0633  01/14/18 0713 01/17/18 0717 01/18/18 0700  NA  --    < >  --    < > 143   < > 142 139 140  K  --    < >  --    < > 4.1   < > 3.9 4.8 4.6  CL  --    < >  --    < > 108   < > 99* 103 103  CO2  --    < >  --    < > 28   < > 31 26 26   GLUCOSE  --    < >  --    < > 83   < > 87 79 87  BUN  --    < >  --    < > 34*   < > 42* 29* 25*  CREATININE  --    < >  --    < > 0.96   < > 1.31* 0.95 1.00  CALCIUM  --    < >  --    < > 8.5*   < > 9.0 8.9 8.9  MG 2.2  --  2.2  --  2.3  --   --   --   --    < > = values in this interval not displayed.   Recent Labs    08/05/17 0912 12/15/17 0216 01/07/18 1722  AST 21 19 16   ALT 5* 12* 12*  ALKPHOS 57 48 49  BILITOT 0.8 1.2 1.3*  PROT 6.3* 6.0* 6.5  ALBUMIN 3.8 3.7 3.8   Recent Labs    12/20/17 1037 01/07/18 1722 01/08/18 0428 01/09/18 0419 01/12/18 0749  WBC 10.3 7.0 5.3 11.6* 8.2  NEUTROABS 7,890* 3.6  --   --  4.3  HGB 14.9 14.7 14.4 13.3 15.2*  HCT 43.9 46.9* 46.2* 42.1 48.2*  MCV 85.6 91.4 91.3 90.7 91.8  PLT 268 207 224 242 227   Lab Results  Component Value Date   TSH 1.87 10/28/2017   Lab Results  Component Value Date   HGBA1C 5.4 10/28/2017   Lab Results  Component Value Date   CHOL 138 05/25/2017   HDL 56 05/25/2017   LDLCALC 61 05/25/2017   TRIG 103 05/25/2017   CHOLHDL 2.5 05/25/2017    Significant Diagnostic  Results in last 30 days:  Dg Chest Port 1 View  Result Date: 01/07/2018 CLINICAL DATA:  Worsening shortness of breath EXAM: PORTABLE CHEST 1 VIEW COMPARISON:  12/15/2017, 10/20/2016 FINDINGS: Mild chronic bronchitic changes. No consolidation or effusion. Stable cardiomediastinal silhouette with aortic atherosclerosis. No pneumothorax. IMPRESSION: No active disease. Electronically Signed   By: Donavan Foil M.D.   On: 01/07/2018 17:44    Assessment/Plan  COPD with acute exacerbation  Patient stable on albuterol ,budesonide and Perforomist  She is on oxygen  She Follows  with pulmonology History of hypertension Olmestartan was discontinued due to symptomatic hypotension. Her SBP will fall to 80 in therapy.  Chronic diastolic CHF (congestive heart failure) With mild lower extremity swelling and weight gain we will restart her Lasix at 20 mg daily Urinary retention It seems like patient had urinary retention in the hospital She had a successful voiding trial in the facility. And foley was discontinued  Has not had any new complaints  Oropharyngeal dysphagia due to Parkinson Patient  had evaluation done by speech in the hospital and is on D3 diet  GERD without esophagitis On chronic Protonix and H2 blocker  Parkinson's Disease On Sinemet Follows with neurology  Anxiety Was discharged on Xanax as needed  Physical deconditioning Started on Therapy. Disposition Discussed with the husband and patient  She is eager to go home      Family/ staff Communication:   Labs/tests ordered:

## 2018-01-18 NOTE — Progress Notes (Signed)
Reviewed, agree 

## 2018-01-18 NOTE — Telephone Encounter (Signed)
Patient's daughter, Floyde Parkins, called. The patient is still in the rehab center and is about to finis the Prednisone taper from the hospital.  Marcie Bal asked if she needs to go back on her Prednisone maintenance dose.  Per Dr Melford Aase, she should start the maintenance dose and will need a hospital follow up . The patient has an appointment on 01/26/2018.

## 2018-01-19 ENCOUNTER — Other Ambulatory Visit: Payer: Self-pay

## 2018-01-19 MED ORDER — ALPRAZOLAM 0.5 MG PO TABS: 0.5000 mg | ORAL_TABLET | Freq: Three times a day (TID) | ORAL | 0 refills | Status: DC

## 2018-01-19 NOTE — Telephone Encounter (Signed)
RX Fax for [facility]@ [phone]  

## 2018-01-21 ENCOUNTER — Non-Acute Institutional Stay (SKILLED_NURSING_FACILITY): Payer: Medicare Other | Admitting: Internal Medicine

## 2018-01-21 DIAGNOSIS — R410 Disorientation, unspecified: Secondary | ICD-10-CM | POA: Diagnosis not present

## 2018-01-21 DIAGNOSIS — I5032 Chronic diastolic (congestive) heart failure: Secondary | ICD-10-CM

## 2018-01-21 DIAGNOSIS — I959 Hypotension, unspecified: Secondary | ICD-10-CM

## 2018-01-22 ENCOUNTER — Encounter (HOSPITAL_COMMUNITY)
Admission: AD | Admit: 2018-01-22 | Discharge: 2018-01-22 | Disposition: A | Discharge status: Home / Self Care | Payer: Medicare Other | Source: Skilled Nursing Facility

## 2018-01-22 LAB — URINALYSIS, ROUTINE W REFLEX MICROSCOPIC
BILIRUBIN URINE: NEGATIVE
GLUCOSE, UA: NEGATIVE mg/dL
KETONES UR: NEGATIVE mg/dL
NITRITE: NEGATIVE
PH: 7 (ref 5.0–8.0)
PROTEIN: NEGATIVE mg/dL
Specific Gravity, Urine: 1.012 (ref 1.005–1.030)

## 2018-01-23 ENCOUNTER — Encounter: Payer: Self-pay | Admitting: Internal Medicine

## 2018-01-23 NOTE — Progress Notes (Signed)
This is an acute visit.  Level care skilled.  Facility is [facility].  Chief complaint-acute visit secondary to increased confusion.  History of present illness.  Patient is a pleasant 82 year old female seen today for possible intermittent increased confusion.  Is she is here for rehab after staying in the hospital for COPD exacerbation as well as volume overload with lower extremity edema.  With a history of diastolic CHF.  She also has a history of Parkinson's disease oropharyngeal dysphagia GERD hypertension and anxiety.  She was seen recently for some mild weight gain and edema and Dr. Lyndel Safe did make her Lasix 20 mg routine-this had been held secondary to hypotension concerns-.    her weight continues to be somewhat variable as- most recently is 129.7-lately she has been in the mid to upper 120s  this will have to be watched edema appears to be relatively stable however--she is not complaining of any increased shortness of breath beyond baseline.  This is complicated somewhat with her blood pressure I got a manual reading of 102/58-at this point will continue to monitor since clinically she appears stable although fragile in this regards.--Again this is somewhat challenging with her blood pressure issues   Her husband had noted some increased confusion earlier today and I am following up on this-she is not complaining of any headache dizziness or syncope.  Is not really complain of dysuria--or any increased shortness of breath beyond baseline.   Past Medical History:  Diagnosis Date  . Balance disorder 2008.     Falls a lot.  She can be standing and then leans too far over to one side   . Bulging disc   . Chronic bronchitis ([place])    due to congestion at times, on prednisone and advair  . COPD (chronic obstructive pulmonary disease) ([place])   . Depression    many years ago  . GERD (gastroesophageal reflux disease)   . Hearing loss    mild  . Hyperlipidemia    . Hypertension   . Iatrogenic adrenal insufficiency ([place])   . Incontinence    not indicated at this visit.  Marland Kitchen LVH (left ventricular hypertrophy) due to hypertensive disease    a. 02/2017: echo showing an EF of 65-70% with moderate LVH, hyperdynamic LV with subvalvular gradient of 70 mmHg, SAM, and mild MR  . Neuropathy    legs stay numb   . Osteoarthritis    hands,   . Osteoporosis   . Shortness of breath dyspnea   . Tubulovillous adenoma of rectum   . Wears dentures   . Wears glasses         Past Surgical History:  Procedure Laterality Date  . ABDOMINAL HYSTERECTOMY    . APPENDECTOMY    . CHONDROPLASTY  09/19/2014   Procedure: CHONDROPLASTY;  Surgeon: Alta Corning, MD;  Location: [facility];  Service: Orthopedics;;  . DILATION AND CURETTAGE OF UTERUS    . EXTERNAL FIXATION LEG  10/25/2012   Procedure: EXTERNAL FIXATION LEG;  Surgeon: Rozanna Box, MD;  Location: [facility];  Service: Orthopedics;  Laterality: Right;  . EYE SURGERY     bilateral cataract surgery and lens implant  . FINGER SURGERY     fusions and debridements for OA  . INCONTINENCE SURGERY     multiple procedures, not cured  . KNEE ARTHROSCOPY WITH LATERAL MENISECTOMY Right 09/19/2014   Procedure: KNEE ARTHROSCOPY WITH LATERAL MENISECTOMY;  Surgeon: Alta Corning, MD;  Location: [facility];  Service: Orthopedics;  Laterality: Right;  . KNEE ARTHROSCOPY WITH MEDIAL MENISECTOMY Right 09/19/2014   Procedure: RIGHT KNEE ARTHROSCOPY WITH MEDIAL AND LATERAL MENISECTOMIES. CHONDROPLASTY OF PATELLA-FEMORAL JOINT;  Surgeon: Alta Corning, MD;  Location: [facility];  Service: Orthopedics;  Laterality: Right;  . RECTAL BIOPSY  09/21/2011   Procedure: BIOPSY RECTAL;  Surgeon: Merrie Roof, MD;  Location: [facility];  Service: General;  Laterality: N/A;  3-4 cm  . RECTAL SURGERY     by dr. Marlou Starks, removal of polyp  . TONSILLECTOMY            Allergies  Allergen Reactions  . Demerol  [Meperidine Hcl] Other (See Comments)    Pt. Feels like she is suffocating  . Ertapenem Hives    Caused whole body to turn red Other reaction(s): Redness Caused whole body to turn red  . Codeine Other (See Comments)    hallucinations  . Demerol Other (See Comments)    hallucinations  . Morphine And Related Other (See Comments)    hallucinations  . Other Other (See Comments)    Invan 7- pt became red all over  . Sulfate Other (See Comments)    unknown        Outpatient Encounter Medications as of 01/18/2018  Medication Sig  . albuterol (PROVENTIL) (2.5 MG/3ML) 0.083% nebulizer solution Use 1 ampule in nebulizer 4 times a day or every 4 hours as needed to rescue asthma.  . ALPRAZolam (XANAX) 0.5 MG tablet Take 1 tablet (0.5 mg total) by mouth 3 (three) times daily.  Marland Kitchen aspirin 81 MG tablet Take 81 mg by mouth daily. Buys OTC  . Balsam [place]-Castor Oil (VENELEX) OINT Apply to sacrum and bilateral buttocks q shift for prevention and prn s/p incontinence or erythema  every shift  . budesonide (PULMICORT) 0.5 MG/2ML nebulizer solution Take 2 mLs (0.5 mg total) by nebulization 2 (two) times daily. Dx-J45.909, J44.0, J20.9  . carbidopa-levodopa (SINEMET IR) 25-100 MG tablet Take 1.5 tablets by mouth 2 (two) times daily.  . Cholecalciferol (VITAMIN D) 2000 units CAPS Take 1 capsule by mouth daily.  Marland Kitchen ENSURE (ENSURE) Take 237 mLs by mouth daily.  . famotidine (PEPCID) 20 MG tablet Take 20 mg by mouth 2 (two) times daily.   . formoterol (PERFOROMIST) 20 MCG/2ML nebulizer solution Take 2 mLs (20 mcg total) by nebulization 2 (two) times daily.  Marland Kitchen nystatin (MYCOSTATIN) 100000 UNIT/ML suspension Take 5 mLs by mouth 3 (three) times daily. Swish and spit three times day from 01/14/2018-01/18/2018  . omeprazole (PRILOSEC) 40 MG capsule Take 1 capsule daily for Acid Reflux  . polyethylene glycol (MIRALAX / GLYCOLAX) packet Take 17 g by mouth daily  as needed for mild constipation.  Marland Kitchen PROCTOZONE-HC 2.5 % rectal cream Apply 1 application topically as needed for hemorrhoids.   . [DISCONTINUED] furosemide (LASIX) 20 MG tablet Take 40 mg by mouth daily.  . [DISCONTINUED] potassium chloride (K-DUR,KLOR-CON) 10 MEQ tablet Take 30 mEq by mouth daily.  . [DISCONTINUED] predniSONE (DELTASONE) 20 MG tablet 2 tablets by mouth daily times 1 day; then 1 tablet by mouth daily times 2 days; then half tablet by mouth daily times 3 days and stop prednisone.   No facility-administered encounter medications on file as of 01/18/2018.      Review of systems.  General she is not complaining of fever chills.  Weight recently appears to be more in the mid upper 120s I do not see a precipitous  weight gain  since Lasix was initiated.  Head ears eyes nose mouth and throat does not complain of visual changes or sore throat.  Respiratory does not complain of any shortness of breath beyond baseline or increased cough.  Cardiac is not complaining of chest pain edema appears to be relatively trace at baseline.  GI does not complain of abdominal discomfort nausea vomiting diarrhea or constipation.  GU is not complaining of dysuria.  Musculoskeletal has significant weakness but does not complain of joint pain at this time.  Neurologic is not complaining of dizziness headache or syncope or lightheadedness.  And psych is not complaining of being anxious or depressed -- she is quite adamant she wants to go home as soon as possible       Immunization History  Administered Date(s) Administered  . Influenza, High Dose Seasonal PF 08/23/2014, 07/24/2015, 08/13/2016, 07/12/2017  . Pneumococcal Conjugate-13 08/23/2014  . Pneumococcal-Unspecified 07/20/2003, 08/17/2013  . Td 08/17/2013  . Tdap 10/25/2012  . Zoster 03/19/2015       Pertinent  Health Maintenance Due  Topic Date Due  . INFLUENZA VACCINE  05/19/2018  . DEXA SCAN  Completed  . PNA vac Low  Risk Adult  Completed   Fall Risk  11/18/2017 10/28/2017 10/20/2017 10/04/2017 06/11/2017  Falls in the past year? No Yes No No No  Number falls in past yr: - 2 or more - - -  Injury with Fall? No No - - -  Risk Factor Category  - - - - -  Comment - - - - -  Risk for fall due to : History of fall(s);Impaired balance/gait;Impaired mobility Impaired balance/gait;History of fall(s);Impaired mobility History of fall(s);Impaired balance/gait;Impaired mobility - -  Risk for fall due to: Comment - - - - -  Follow up Falls evaluation completed Falls evaluation completed;Falls prevention discussed - - -   Functional Status Survey:   Physical exam. In general this is a pleasant somewhat frail-appearing elderly female in no distress.  Her skin is warm and dry.  Eyes sclera and conjunctive are clear visual acuity appears grossly intact.  Oropharynx is clear mucous membranes moist tongue is midline.  Chest is clear to auscultation with shallow air entry which is baseline there is no labored breathing.  Heart is regular rate and rhythm  with a systolic murmur-she has trace lower extremity edema appears relatively baseline.  GI abdomen is soft nontender with positive bowel sounds.  GU could not really appreciate suprapubic tenderness.  Musculoskeletal moves all extremities at baseline with generalized weakness and frailty and arthritic changes.  Neurologic is grossly intact her speech is clear tongue is midline with full range of motion cranial nerves appear to be intact pupils are equal round he is reactive to light-visual acuity appears to be intact strength appears to be intact and at baseline all 4 extremities    Psych she is alert and oriented--appear to be relatively at her baseline-she did have a bit of difficulty identifying a family friend who was with her but did so successfully eventually-    her husband said earlier today she appeared to just be a bit more confused-  Labs.  January 18, 2018.  Sodium 140 potassium 4.6 BUN 25 creatinine 1.0.  January 12, 2018.  WBC 8.2 hemoglobin 15.2 platelets 297.  Assessment and plan.  1.  Confusion-this appears to be intermittent- we will obtain a UA C&S--- is possible is slightly more confused than when I saw her earlier although this is  somewhat difficult to tell--- eurologically she appears to be intact  I do note her temperature is minimally elevated at 99.1- does not show evidence of fever chills-we will send for a urinalysis and culture.  Also monitor vital signs pulse ox every shift.  Of note she also has a BMP scheduled for the next laboratory day will add a CBC with differential to this again will need monitoring closely as well for any changes but clinically she appears essentially at baseline.  2.  History of diastolic CHF-her weight appears to be hovering more in the upper 120s recently edema appears to be at baseline-  clinically she is somewhat fragile but appears relatively stable with previous exam- will monitor again-she is now on Lasix and follow-up BMP has been ordered to keep an eye on her renal function also continue to monitor her weights closely.  This is complicated with her borderline hypotension with concerns increasing her diuretic will lead to more hypotensive episodes.  [id number]

## 2018-01-24 ENCOUNTER — Encounter: Payer: Self-pay | Admitting: Internal Medicine

## 2018-01-24 ENCOUNTER — Non-Acute Institutional Stay (SKILLED_NURSING_FACILITY): Payer: Medicare Other | Admitting: Internal Medicine

## 2018-01-24 ENCOUNTER — Ambulatory Visit (HOSPITAL_COMMUNITY)
Admission: RE | Admit: 2018-01-24 | Discharge: 2018-01-24 | Disposition: A | Discharge status: Home / Self Care | Payer: Medicare Other | Source: Ambulatory Visit | Attending: Internal Medicine | Admitting: Internal Medicine

## 2018-01-24 ENCOUNTER — Encounter (HOSPITAL_COMMUNITY)
Admission: RE | Admit: 2018-01-24 | Discharge: 2018-01-24 | Disposition: A | Discharge status: Home / Self Care | Payer: Medicare Other | Source: Skilled Nursing Facility | Attending: *Deleted | Admitting: *Deleted

## 2018-01-24 DIAGNOSIS — G20C Parkinsonism, unspecified: Secondary | ICD-10-CM

## 2018-01-24 DIAGNOSIS — J189 Pneumonia, unspecified organism: Secondary | ICD-10-CM

## 2018-01-24 DIAGNOSIS — G232 Striatonigral degeneration: Secondary | ICD-10-CM | POA: Diagnosis not present

## 2018-01-24 DIAGNOSIS — R1312 Dysphagia, oropharyngeal phase: Secondary | ICD-10-CM

## 2018-01-24 DIAGNOSIS — R0602 Shortness of breath: Secondary | ICD-10-CM | POA: Diagnosis not present

## 2018-01-24 DIAGNOSIS — J42 Unspecified chronic bronchitis: Secondary | ICD-10-CM

## 2018-01-24 DIAGNOSIS — I5032 Chronic diastolic (congestive) heart failure: Secondary | ICD-10-CM

## 2018-01-24 LAB — CBC WITH DIFFERENTIAL/PLATELET
BASOS ABS: 0.1 10*3/uL (ref 0.0–0.1)
Basophils Relative: 1 %
EOS PCT: 2 %
Eosinophils Absolute: 0.2 10*3/uL (ref 0.0–0.7)
HCT: 41.7 % (ref 36.0–46.0)
Hemoglobin: 13.2 g/dL (ref 12.0–15.0)
LYMPHS PCT: 10 %
Lymphs Abs: 1.1 10*3/uL (ref 0.7–4.0)
MCH: 29.1 pg (ref 26.0–34.0)
MCHC: 31.7 g/dL (ref 30.0–36.0)
MCV: 91.9 fL (ref 78.0–100.0)
MONO ABS: 0.6 10*3/uL (ref 0.1–1.0)
Monocytes Relative: 6 %
Neutro Abs: 8.6 10*3/uL — ABNORMAL HIGH (ref 1.7–7.7)
Neutrophils Relative %: 81 %
PLATELETS: 221 10*3/uL (ref 150–400)
RBC: 4.54 MIL/uL (ref 3.87–5.11)
RDW: 14.4 % (ref 11.5–15.5)
WBC: 10.6 10*3/uL — ABNORMAL HIGH (ref 4.0–10.5)

## 2018-01-24 LAB — BASIC METABOLIC PANEL
Anion gap: 9 (ref 5–15)
BUN: 16 mg/dL (ref 6–20)
CALCIUM: 8.7 mg/dL — AB (ref 8.9–10.3)
CO2: 27 mmol/L (ref 22–32)
Chloride: 104 mmol/L (ref 101–111)
Creatinine, Ser: 0.93 mg/dL (ref 0.44–1.00)
GFR calc Af Amer: >60 mL/min (ref >60)
GFR, EST NON AFRICAN AMERICAN: 57 mL/min — AB (ref >60)
Glucose, Bld: 133 mg/dL — ABNORMAL HIGH (ref 65–99)
POTASSIUM: 4 mmol/L (ref 3.5–5.1)
Sodium: 140 mmol/L (ref 135–145)

## 2018-01-24 LAB — URINE CULTURE: Culture: NO GROWTH

## 2018-01-24 NOTE — Progress Notes (Signed)
Location:   [facility] Room Number: 155/P Place of Service:  SNF (31) Provider:  Leafy Ro, MD  Patient Care Team: [name], MD as PCP - General (Internal Medicine) Dorna Leitz, MD as Consulting Physician (Orthopedic Surgery) Teena Irani, MD (Inactive) as Consulting Physician (Gastroenterology) Garrel Ridgel, DPM as Consulting Physician (Podiatry) Juanito Doom, MD as Consulting Physician (Pulmonary Disease) Alda Berthold, DO as Consulting Physician (Neurology)  Extended Emergency Contact Information Primary Emergency Contact: [facility] Address: [address]          [place], [place] [address] [place] Phone: [phone] Mobile Phone: [phone] Relation: Spouse Secondary Emergency Contact: [place] Mobile Phone: [phone] Relation: Daughter  Code Status:  DNR Goals of care: Advanced Directive information Advanced Directives 01/24/2018  Does Patient Have a Medical Advance Directive? Yes  Type of Advance Directive Out of facility DNR (pink MOST or yellow form)  Does patient want to make changes to medical advance directive? No - Patient declined  Copy of [place] in Chart? No - copy requested  Would patient like information on creating a medical advance directive? -  Pre-existing out of facility DNR order (yellow form or pink MOST form) -     Chief Complaint  Patient presents with  . Acute Visit    Patients c/o Cough and Weakness    HPI:  Pt is a 82 y.o. female seen today for an acute visit for Cough and  Weakness   Patient was admitted  to SNF for therapy after staying in the hospital from 03/22-03/26 For COPD Exacerbation and Volume Overload with LE Edema. Patient has a history of COPD Gold stage IIwith FEV1 of 69%,on as needed oxygen, diastolic heart failure due to LVH Echo in 06/18,, Parkinson disease follows with neurology, oropharyngeal dysphagia, reflux,  hypertension and anxiety. Patient has had recurrent hospital admissions due to COPD exacerbation12/18,02/19,03/19  Patient was doing well with therapy.  But for the last few days the therapy noticed that patient was getting more confused and also was complaining of weakness.  She was seen by Arlo on Friday and had a UA done which was completely negative.  Her CBC showed mild elevation of her white count. Patient denied any shortness of breath.  No chest pain fever or chills.  She did say she has some cough. Patient also started to cry when I asked her if she is depressed.  She says she really wants to get stronger and go home.  Past Medical History:  Diagnosis Date  . Balance disorder 2008.     Falls a lot.  She can be standing and then leans too far over to one side   . Bulging disc   . Chronic bronchitis ([place])    due to congestion at times, on prednisone and advair  . COPD (chronic obstructive pulmonary disease) ([place])   . Depression    many years ago  . GERD (gastroesophageal reflux disease)   . Hearing loss    mild  . Hyperlipidemia   . Hypertension   . Iatrogenic adrenal insufficiency ([place])   . Incontinence    not indicated at this visit.  Marland Kitchen LVH (left ventricular hypertrophy) due to hypertensive disease    a. 02/2017: echo showing an EF of 65-70% with moderate LVH, hyperdynamic LV with subvalvular gradient of 70 mmHg, SAM, and mild MR  . Neuropathy    legs stay numb   . Osteoarthritis  hands,   . Osteoporosis   . Shortness of breath dyspnea   . Tubulovillous adenoma of rectum   . Wears dentures   . Wears glasses    Past Surgical History:  Procedure Laterality Date  . ABDOMINAL HYSTERECTOMY    . APPENDECTOMY    . CHONDROPLASTY  09/19/2014   Procedure: CHONDROPLASTY;  Surgeon: Alta Corning, MD;  Location: [facility];  Service: Orthopedics;;  . DILATION AND CURETTAGE OF UTERUS    . EXTERNAL FIXATION LEG  10/25/2012   Procedure: EXTERNAL FIXATION LEG;   Surgeon: Rozanna Box, MD;  Location: [facility];  Service: Orthopedics;  Laterality: Right;  . EYE SURGERY     bilateral cataract surgery and lens implant  . FINGER SURGERY     fusions and debridements for OA  . INCONTINENCE SURGERY     multiple procedures, not cured  . KNEE ARTHROSCOPY WITH LATERAL MENISECTOMY Right 09/19/2014   Procedure: KNEE ARTHROSCOPY WITH LATERAL MENISECTOMY;  Surgeon: Alta Corning, MD;  Location: [facility];  Service: Orthopedics;  Laterality: Right;  . KNEE ARTHROSCOPY WITH MEDIAL MENISECTOMY Right 09/19/2014   Procedure: RIGHT KNEE ARTHROSCOPY WITH MEDIAL AND LATERAL MENISECTOMIES. CHONDROPLASTY OF PATELLA-FEMORAL JOINT;  Surgeon: Alta Corning, MD;  Location: [facility];  Service: Orthopedics;  Laterality: Right;  . RECTAL BIOPSY  09/21/2011   Procedure: BIOPSY RECTAL;  Surgeon: Merrie Roof, MD;  Location: [facility];  Service: General;  Laterality: N/A;  3-4 cm  . RECTAL SURGERY     by dr. Marlou Starks, removal of polyp  . TONSILLECTOMY      Allergies  Allergen Reactions  . Demerol  [Meperidine Hcl] Other (See Comments)    Pt. Feels like she is suffocating  . Ertapenem Hives    Caused whole body to turn red Other reaction(s): Redness Caused whole body to turn red  . Codeine Other (See Comments)    hallucinations  . Demerol Other (See Comments)    hallucinations  . Morphine And Related Other (See Comments)    hallucinations  . Other Other (See Comments)    Invan 7- pt became red all over  . Sulfate Other (See Comments)    unknown    Outpatient Encounter Medications as of 01/24/2018  Medication Sig  . albuterol (PROVENTIL) (2.5 MG/3ML) 0.083% nebulizer solution Use 1 ampule in nebulizer 4 times a day or every 4 hours as needed to rescue asthma.  . ALPRAZolam (XANAX) 0.5 MG tablet Take 1 tablet (0.5 mg total) by mouth 3 (three) times daily.  Marland Kitchen aspirin 81 MG tablet Take 81 mg by mouth daily. Buys OTC  . Balsam [place]-Castor Oil  (VENELEX) OINT Apply to sacrum and bilateral buttocks q shift for prevention and prn s/p incontinence or erythema  every shift  . budesonide (PULMICORT) 0.5 MG/2ML nebulizer solution Take 2 mLs (0.5 mg total) by nebulization 2 (two) times daily. Dx-J45.909, J44.0, J20.9  . carbidopa-levodopa (SINEMET IR) 25-100 MG tablet Take 1.5 tablets by mouth 2 (two) times daily.  . Cholecalciferol (VITAMIN D) 2000 units CAPS Take 1 capsule by mouth daily.  Marland Kitchen ENSURE (ENSURE) Take 237 mLs by mouth daily.  . famotidine (PEPCID) 20 MG tablet Take 20 mg by mouth 2 (two) times daily.   . formoterol (PERFOROMIST) 20 MCG/2ML nebulizer solution Take 2 mLs (20 mcg total) by nebulization 2 (two) times daily.  . furosemide (LASIX) 20 MG tablet Take 20 mg by mouth daily.  Marland Kitchen omeprazole (  PRILOSEC) 40 MG capsule Take 1 capsule daily for Acid Reflux  . polyethylene glycol (MIRALAX / GLYCOLAX) packet Take 17 g by mouth daily as needed for mild constipation.  . potassium chloride (KLOR-CON) 20 MEQ packet Take by mouth 2 (two) times daily.  Marland Kitchen PROCTOZONE-HC 2.5 % rectal cream Apply 1 application topically as needed for hemorrhoids.   . [DISCONTINUED] nystatin (MYCOSTATIN) 100000 UNIT/ML suspension Take 5 mLs by mouth 3 (three) times daily. Swish and spit three times day from 01/14/2018-01/18/2018   No facility-administered encounter medications on file as of 01/24/2018.      Review of Systems  Constitutional: Negative.   HENT: Negative.   Respiratory: Positive for cough.   Cardiovascular: Negative.   Gastrointestinal: Negative.   Genitourinary: Negative.   Musculoskeletal: Negative.   Neurological: Positive for weakness.  Psychiatric/Behavioral: Positive for confusion and dysphoric mood.    Immunization History  Administered Date(s) Administered  . Influenza, High Dose Seasonal PF 08/23/2014, 07/24/2015, 08/13/2016, 07/12/2017  . Pneumococcal Conjugate-13 08/23/2014  . Pneumococcal-Unspecified 07/20/2003, 08/17/2013  .  Td 08/17/2013  . Tdap 10/25/2012  . Zoster 03/19/2015   Pertinent  Health Maintenance Due  Topic Date Due  . INFLUENZA VACCINE  05/19/2018  . DEXA SCAN  Completed  . PNA vac Low Risk Adult  Completed   Fall Risk  11/18/2017 10/28/2017 10/20/2017 10/04/2017 06/11/2017  Falls in the past year? No Yes No No No  Number falls in past yr: - 2 or more - - -  Injury with Fall? No No - - -  Risk Factor Category  - - - - -  Comment - - - - -  Risk for fall due to : History of fall(s);Impaired balance/gait;Impaired mobility Impaired balance/gait;History of fall(s);Impaired mobility History of fall(s);Impaired balance/gait;Impaired mobility - -  Risk for fall due to: Comment - - - - -  Follow up Falls evaluation completed Falls evaluation completed;Falls prevention discussed - - -   Functional Status Survey:    Vitals:   01/24/18 1523  BP: (!) 101/50  Pulse: (!) 52  Resp: 14  Temp: 97.6 F (36.4 C)  TempSrc: Oral   There is no height or weight on file to calculate BMI. Physical Exam  Constitutional: She appears well-developed.  HENT:  Head: Normocephalic.  Mouth/Throat: Oropharynx is clear and moist.  Eyes: Pupils are equal, round, and reactive to light.  Neck: Neck supple.  Cardiovascular: Normal rate and regular rhythm.  Murmur heard. Pulmonary/Chest: Effort normal and breath sounds normal. She has no wheezes. She has no rales.  Abdominal: Soft. Bowel sounds are normal. She exhibits no distension. There is no tenderness. There is no guarding.  Musculoskeletal: She exhibits no edema.  Lymphadenopathy:    She has no cervical adenopathy.  Neurological: She is alert.  Patient was oriented to Time and Place. She did not have any focal deficits.  Skin: Skin is warm and dry.  Psychiatric: Her mood appears anxious. Her speech is delayed. She exhibits a depressed mood.    Labs reviewed: Recent Labs    09/20/17 0755  10/28/17 1527  01/10/18 0633  01/17/18 0717 01/18/18 0700  01/24/18 1000  NA  --    < >  --    < > 143   < > 139 140 140  K  --    < >  --    < > 4.1   < > 4.8 4.6 4.0  CL  --    < >  --    < >  108   < > 103 103 104  CO2  --    < >  --    < > 28   < > 26 26 27   GLUCOSE  --    < >  --    < > 83   < > 79 87 133*  BUN  --    < >  --    < > 34*   < > 29* 25* 16  CREATININE  --    < >  --    < > 0.96   < > 0.95 1.00 0.93  CALCIUM  --    < >  --    < > 8.5*   < > 8.9 8.9 8.7*  MG 2.2  --  2.2  --  2.3  --   --   --   --    < > = values in this interval not displayed.   Recent Labs    08/05/17 0912 12/15/17 0216 01/07/18 1722  AST 21 19 16   ALT 5* 12* 12*  ALKPHOS 57 48 49  BILITOT 0.8 1.2 1.3*  PROT 6.3* 6.0* 6.5  ALBUMIN 3.8 3.7 3.8   Recent Labs    01/07/18 1722  01/09/18 0419 01/12/18 0749 01/24/18 1000  WBC 7.0   < > 11.6* 8.2 10.6*  NEUTROABS 3.6  --   --  4.3 8.6*  HGB 14.7   < > 13.3 15.2* 13.2  HCT 46.9*   < > 42.1 48.2* 41.7  MCV 91.4   < > 90.7 91.8 91.9  PLT 207   < > 242 227 221   < > = values in this interval not displayed.   Lab Results  Component Value Date   TSH 1.87 10/28/2017   Lab Results  Component Value Date   HGBA1C 5.4 10/28/2017   Lab Results  Component Value Date   CHOL 138 05/25/2017   HDL 56 05/25/2017   LDLCALC 61 05/25/2017   TRIG 103 05/25/2017   CHOLHDL 2.5 05/25/2017    Significant Diagnostic Results in last 30 days:  Dg Chest Port 1 View  Result Date: 01/07/2018 CLINICAL DATA:  Worsening shortness of breath EXAM: PORTABLE CHEST 1 VIEW COMPARISON:  12/15/2017, 10/20/2016 FINDINGS: Mild chronic bronchitic changes. No consolidation or effusion. Stable cardiomediastinal silhouette with aortic atherosclerosis. No pneumothorax. IMPRESSION: No active disease. Electronically Signed   By: Donavan Foil M.D.   On: 01/07/2018 17:44    Assessment/Plan  Shortness of breath with leukocytosis Will get stat chest x-ray to rule out pneumonia. Addendum X-ray showed patient has Early left  retrocardiac infiltrate Will start her on Levaquin 500 mg p.o. daily for 7 days COPD  Patient stable on albuterol,budesonide andPerforomist She is on oxygen  History of hypertension Olmestartan was discontinued due to symptomatic hypotension. Her SBP will fall to 80 in therapy.  Chronic diastolic CHF (congestive heart failure)  patient's Lasix was decreased from 60 to 20 mg due to increase in BUN and postural hypotension. Patient has been doing well on this dose though her weight has been a little to 127 pounds with depression Urinary retention It seems like patient had urinary retention in the hospital She had a successful voiding trial in the facility. And foley was discontinued  Has not had any new complaints  Oropharyngeal dysphagiadue toParkinson Patient had evaluation done by speech in the hospital and is on D3 diet  GERDwithout esophagitis On chronic Protonix and H2 blocker  Parkinson'sDisease  On Sinemet Follows with neurology  Anxiety with Depression Is  on Xanax as needed Will start her on Low dose of Zoloft to see if it improves her Mood.  Physical deconditioning   Patient was doing very well with physical therapy but has had a setback due to recent clinical changes.  We will continue with therapy Disposition Discussed with the husbandand patientShe is eager to go home       Family/ staff Communication:   Labs/tests ordered:   Total time spent in this patient care encounter was 45_ minutes; greater than 50% of the visit spent counseling patient, reviewing records , Labs and coordinating care for problems addressed at this encounter.

## 2018-01-25 ENCOUNTER — Other Ambulatory Visit: Payer: Self-pay | Admitting: Pharmacist

## 2018-01-25 ENCOUNTER — Other Ambulatory Visit: Payer: Self-pay | Admitting: *Deleted

## 2018-01-25 ENCOUNTER — Encounter: Payer: Self-pay | Admitting: Adult Health

## 2018-01-25 NOTE — Progress Notes (Signed)
FOLLOW UP  Assessment and Plan:   Chronic diastolic CHF (congestive heart failure) (HCC) Continue daily weights at SNF, contact for acute increase/changes No edema or signs of fluid overload today Emphasized salt restriction, less than 2000mg  a day. Encouraged daily monitoring of the patient's weight, call office if 3 lb weight loss or gain in a day.  Encouraged regular exercise. If any increasing shortness of breath, swelling, or chest pressure go to ER immediately.  decrease your fluid intake to less than 2 L daily  Chronic respiratory failure with hypoxia, on home O2 therapy (HCC) Continue inhalers, symptoms stable today at office - ? Accurate sats Follow up with pulmonology as recommended Discussed appropriate medication use  Protein-calorie malnutrition, severe Continue with ensure  Depression, major, in remission ([place]) with anxiety Continue medications; xanax PRN only -  Lifestyle discussed: diet/exerise, sleep hygiene, stress management, hydration  Gastroesophageal reflux disease without esophagitis Well managed on current medications Discussed diet, avoiding triggers and other lifestyle changes  Hypertension Well controlled with current medications  Monitor blood pressure at home; patient to call if consistently greater than 130/80 Continue DASH diet.   Reminder to go to the ER if any CP, SOB, nausea, dizziness, severe HA, changes vision/speech, left arm numbness and tingling and jaw pain.  Cholesterol Currently at goal Continue low cholesterol diet and exercise.  Defer checking lipid panel.   Other abnormal glucose Recent A1Cs at goal Discussed diet/exercise, weight management  Defer A1C; check BMP  Vitamin D Def Below goal at last visit; will have increase dose to 4000 IU daily for goal of 70-100 as long as she is not having trouble swallowing pills Check Vit D level  Continue diet and meds as discussed. Further disposition pending results of labs.  Discussed med's effects and SE's.   Over 30 minutes of exam, counseling, chart review, and critical decision making was performed.   Future Appointments  Date Time Provider [place]  01/26/2018 11:00 AM Liane Comber, NP [facility] None  03/02/2018 11:45 AM Juanito Doom, MD [facility] None  03/04/2018 11:30 AM Alda Berthold, DO [facility] None  05/12/2018 11:00 AM [name], MD [facility] None    ----------------------------------------------------------------------------------------------------------------------  HPI 82 y.o. female  Presents accompanied by her husband and daughter for 3 month follow up on management of chronic medical conditions.   She has hx of restrictive Lung Dz and Parkinson's (+) with mild Dementia and limited insight has has numerous ER visits for acute dyspnea compounding her moderately severe Restrictive Lung Disease when she panics . It has been long suspected that there is a large component of poor compliance in medication regimen of her inhalent bronchodilator therapy and husband likewise seems oblivious to her med dosing schedules and of little help in assessing her compliance. Home health with medication management have been ordered, but ?due to patient request has not been continued. The daughter reportedly puts medications in med organizer, but the patient frequently will still not take consistently.    The patient presents today from SNF where she is receiving therapy after staying in the hospital from 03/22-03/26 For COPD Exacerbation and Volume Overload with LE Edema. Patient has a history of COPD Gold stage IIwith FEV1 of 69%,on as needed oxygen, diastolic heart failure due to LVH Echo in 06/18, Parkinson disease follows with neurology, oropharyngeal dysphagia, reflux, hypertension and anxiety. Patient has had recurrent hospital admissions due to COPD exacerbation12/18,02/19,03/19. She is here today on 2L O2, very difficult to obtain  saturations - she  reports no dyspnea but saturations demonstrating 89%. She reports some fatigue, but demonstrates no edema, denies palpitations, chest pressure, cough, etc.   She was previously on daily maintenance dose of prednisone 10 mg daily; this appears to have been discontinued; will postpone restarting this at this time and continue to evaluate how she does with guaranteed regular administration of medication regimen while in SNF and discuss need for prednisone at time of discharge.   She is reportedly eating well, takes medications without notable problems, and having daily BMs.   she has a diagnosis of GERD which is currently managed by prilosec 40 mg daily she reports symptoms is currently well controlled, and denies breakthrough reflux, burning in chest, hoarseness or cough.    She has a history of Diastolic, denies dyspnea on exertion. Positive for fatigue. Wt Readings from Last 3 Encounters:  01/17/18 129 lb 9.6 oz (58.8 kg)  01/11/18 123 lb 7.3 oz (56 kg)  12/20/17 129 lb (58.5 kg)   Her blood pressure has been controlled at home, today their BP is BP: 116/60  She does not workout. She denies chest pain, shortness of breath, dizziness.   She is not on cholesterol medication and denies myalgias. Her cholesterol is at goal. The cholesterol last visit was:   Lab Results  Component Value Date   CHOL 138 05/25/2017   HDL 56 05/25/2017   LDLCALC 61 05/25/2017   TRIG 103 05/25/2017   CHOLHDL 2.5 05/25/2017   Last A1C in the office was:  Lab Results  Component Value Date   HGBA1C 5.4 10/28/2017   Patient is on Vitamin D supplement but remained well below goal at recent check:    Lab Results  Component Value Date   VD25OH 35 10/28/2017        Current Medications:  Current Outpatient Medications on File Prior to Visit  Medication Sig  . albuterol (PROVENTIL) (2.5 MG/3ML) 0.083% nebulizer solution Use 1 ampule in nebulizer 4 times a day or every 4 hours as needed to  rescue asthma.  . ALPRAZolam (XANAX) 0.5 MG tablet Take 1 tablet (0.5 mg total) by mouth 3 (three) times daily.  Marland Kitchen aspirin 81 MG tablet Take 81 mg by mouth daily. Buys OTC  . Balsam [place]-Castor Oil (VENELEX) OINT Apply to sacrum and bilateral buttocks q shift for prevention and prn s/p incontinence or erythema  every shift  . budesonide (PULMICORT) 0.5 MG/2ML nebulizer solution Take 2 mLs (0.5 mg total) by nebulization 2 (two) times daily. Dx-J45.909, J44.0, J20.9  . carbidopa-levodopa (SINEMET IR) 25-100 MG tablet Take 1.5 tablets by mouth 2 (two) times daily.  . Cholecalciferol (VITAMIN D) 2000 units CAPS Take 1 capsule by mouth daily.  Marland Kitchen ENSURE (ENSURE) Take 237 mLs by mouth daily.  . famotidine (PEPCID) 20 MG tablet Take 20 mg by mouth 2 (two) times daily.   . formoterol (PERFOROMIST) 20 MCG/2ML nebulizer solution Take 2 mLs (20 mcg total) by nebulization 2 (two) times daily.  . furosemide (LASIX) 20 MG tablet Take 20 mg by mouth daily.  Marland Kitchen omeprazole (PRILOSEC) 40 MG capsule Take 1 capsule daily for Acid Reflux  . polyethylene glycol (MIRALAX / GLYCOLAX) packet Take 17 g by mouth daily as needed for mild constipation.  . potassium chloride (KLOR-CON) 20 MEQ packet Take by mouth 2 (two) times daily.  Marland Kitchen PROCTOZONE-HC 2.5 % rectal cream Apply 1 application topically as needed for hemorrhoids.    No current facility-administered medications on file prior to visit.  Allergies:  Allergies  Allergen Reactions  . Demerol  [Meperidine Hcl] Other (See Comments)    Pt. Feels like she is suffocating  . Ertapenem Hives    Caused whole body to turn red Other reaction(s): Redness Caused whole body to turn red  . Codeine Other (See Comments)    hallucinations  . Demerol Other (See Comments)    hallucinations  . Morphine And Related Other (See Comments)    hallucinations  . Other Other (See Comments)    Invan 7- pt became red all over  . Sulfate Other (See Comments)    unknown      Medical History:  Past Medical History:  Diagnosis Date  . Balance disorder 2008.     Falls a lot.  She can be standing and then leans too far over to one side   . Bulging disc   . Chronic bronchitis ([place])    due to congestion at times, on prednisone and advair  . COPD (chronic obstructive pulmonary disease) ([place])   . COPD with acute exacerbation ([place]) 12/15/2017  . Depression    many years ago  . GERD (gastroesophageal reflux disease)   . Hearing loss    mild  . Hyperlipidemia   . Hypertension   . Iatrogenic adrenal insufficiency ([place])   . Incontinence    not indicated at this visit.  Marland Kitchen LVH (left ventricular hypertrophy) due to hypertensive disease    a. 02/2017: echo showing an EF of 65-70% with moderate LVH, hyperdynamic LV with subvalvular gradient of 70 mmHg, SAM, and mild MR  . Neuropathy    legs stay numb   . Osteoarthritis    hands,   . Osteoporosis   . Shortness of breath dyspnea   . Tubulovillous adenoma of rectum   . Wears dentures   . Wears glasses    Family history- Reviewed and unchanged Social history- Reviewed and unchanged   Review of Systems:  Review of Systems  Constitutional: Positive for malaise/fatigue. Negative for chills, diaphoresis, fever and weight loss.  HENT: Negative for congestion, hearing loss, sore throat and tinnitus.   Eyes: Negative for blurred vision and double vision.  Respiratory: Negative for cough, sputum production, shortness of breath and wheezing.   Cardiovascular: Negative for chest pain, palpitations, orthopnea, claudication and leg swelling.  Gastrointestinal: Negative for abdominal pain, blood in stool, constipation, diarrhea, heartburn, melena, nausea and vomiting.  Genitourinary: Negative.   Musculoskeletal: Negative for falls, joint pain and myalgias.  Skin: Negative for rash.  Neurological: Positive for weakness. Negative for dizziness, tingling, sensory change, speech change, focal weakness and headaches.   Endo/Heme/Allergies: Negative for polydipsia.  Psychiatric/Behavioral: Negative.   All other systems reviewed and are negative.   Physical Exam: BP 116/60   Pulse 80   Temp (!) 97.5 F (36.4 C)   Ht 5\' 3"  (1.6 m)   Wt 115 lb (52.2 kg)   SpO2 (!) 89%   BMI 20.37 kg/m  Wt Readings from Last 3 Encounters:  01/17/18 129 lb 9.6 oz (58.8 kg)  01/11/18 123 lb 7.3 oz (56 kg)  12/20/17 129 lb (58.5 kg)   General Appearance: Well nourished, appears fatigued/not at known baseline, in no acute distress. Eyes: PERRLA, EOMs, conjunctiva no swelling or erythema Sinuses: No Frontal/maxillary tenderness ENT/Mouth: Ext aud canals clear, TMs without erythema, bulging. No erythema, swelling, or exudate on post pharynx.  Tonsils not swollen or erythematous. Hearing normal.  Neck: Supple, thyroid normal.   Respiratory: Respiratory effort normal, expansion  limited somewhat, BS equal bilaterally without rales, rhonchi, wheezing or stridor.  Cardio: RRR with 4/6 blowing systolic murmur. +1 symmetrical distal pulses without notable edema.  Abdomen: Soft, + BS.  Non tender, no guarding, rebound, hernias, masses. Lymphatics: Non tender without lymphadenopathy.  Musculoskeletal: Symmetrical strength, weak,  Limited to wheelchair Skin: Visible skin Warm, dry, very thin/fragile, scab with some bruising to left hand dorsal surface Psych: Awake and oriented X 3, normal affect, Insight and Judgment questionable.   Izora Ribas, NP 10:56 AM [facility]

## 2018-01-25 NOTE — Patient Outreach (Signed)
[facility]) Care Management  01/25/2018  Marie [name] [birth year] [id number]   Referral received from hospital liaison that patient was going to SNF upon hospital discharge for this [facility] to follow up on any discharge needs from SNF for [place] care management program.  This RNCM Attended IDT meeting at facility 01/25/18 to review case. Patient had a COPD exacerbation this week and was placed on antibiotics. She needs a lot of assistance with ADLs and IADLs. Concerns were voiced about medication management at home and if patient may need more care than spouse is able to provide.  Her discharge plan is to go home with spouse. There is a daughter who is involved in patient care.  Patient has had 4 admissions in the past 6 months. Patient has history of CHF and COPD.  SNF is thinking patient may be getting near to going home due to not much progress. She will go home with home care, she had [facility] care before admission and they will set up patient with home care again upon discharge.   Met with spouse at facility last week and discussed [facility] care management, left [facility] packet. He  was open to care management services for wife, he will review with wife and daughter. He states daughter does assist with some of their management.   Plan to refer to [place] management as patient may be discharging this week.  Will refer for LCSW to follow while at facility, [facility], and pharmacy to follow upon discharge from facility.   Royetta Crochet. Laymond Purser, RN, BSN, [place] [phone]) Business Cell  [phone]) Toll Free Office

## 2018-01-25 NOTE — Patient Outreach (Signed)
[facility]) Care Management  01/25/2018  Marie [name] [birth year] [id number]  82 year old female referred to [place] Management by for transition of care services following recent hospitalization and SNF. [place] services requested for medication reconcilitation.  PMHx includes, but not limited to, COPD, chronic diastolic heart failure, Parkinson's disease with oropharyngeal dysphagia, neuropathy, GERD, HTN, urinary retention, depression, and anxiety.  Noted patient has had multiple admissions in the last 4 months related to COPD exacerbations.    Call placed to patient's home and spouse answered phone.  Per spouse, patient remains in SNF and he does not have an estimate on discharge.   Plan: I will follow-up with [facility] post acute care coordinator to collaborate on patient discharge and when to reach out to patient.   Ralene Bathe, PharmD, [place] [phone]

## 2018-01-26 ENCOUNTER — Ambulatory Visit (INDEPENDENT_AMBULATORY_CARE_PROVIDER_SITE_OTHER): Payer: Medicare Other | Admitting: Adult Health

## 2018-01-26 ENCOUNTER — Encounter: Payer: Self-pay | Admitting: Adult Health

## 2018-01-26 VITALS — BP 116/60 | HR 80 | Temp 97.5°F | Ht 63.0 in | Wt 115.0 lb

## 2018-01-26 DIAGNOSIS — I5032 Chronic diastolic (congestive) heart failure: Secondary | ICD-10-CM

## 2018-01-26 DIAGNOSIS — F419 Anxiety disorder, unspecified: Secondary | ICD-10-CM

## 2018-01-26 DIAGNOSIS — Z9981 Dependence on supplemental oxygen: Secondary | ICD-10-CM | POA: Diagnosis not present

## 2018-01-26 DIAGNOSIS — J9611 Chronic respiratory failure with hypoxia: Secondary | ICD-10-CM | POA: Diagnosis not present

## 2018-01-26 DIAGNOSIS — E559 Vitamin D deficiency, unspecified: Secondary | ICD-10-CM

## 2018-01-26 DIAGNOSIS — R7303 Prediabetes: Secondary | ICD-10-CM | POA: Diagnosis not present

## 2018-01-26 DIAGNOSIS — K219 Gastro-esophageal reflux disease without esophagitis: Secondary | ICD-10-CM

## 2018-01-26 DIAGNOSIS — F325 Major depressive disorder, single episode, in full remission: Secondary | ICD-10-CM | POA: Diagnosis not present

## 2018-01-26 DIAGNOSIS — E43 Unspecified severe protein-calorie malnutrition: Secondary | ICD-10-CM | POA: Diagnosis not present

## 2018-01-26 DIAGNOSIS — E782 Mixed hyperlipidemia: Secondary | ICD-10-CM | POA: Diagnosis not present

## 2018-01-26 DIAGNOSIS — Z79899 Other long term (current) drug therapy: Secondary | ICD-10-CM | POA: Diagnosis not present

## 2018-01-26 DIAGNOSIS — I10 Essential (primary) hypertension: Secondary | ICD-10-CM

## 2018-01-26 MED ORDER — ALPRAZOLAM 0.5 MG PO TABS: 0.5000 mg | ORAL_TABLET | Freq: Three times a day (TID) | ORAL | 0 refills | Status: DC | PRN

## 2018-01-26 MED ORDER — CHOLECALCIFEROL 100 MCG (4000 UT) PO CAPS: 1.0000 | ORAL_CAPSULE | Freq: Every day | ORAL | Status: DC

## 2018-01-26 NOTE — Patient Instructions (Signed)
Discuss: activity level/strength, benefit from PT, long term plan? - how is she doing in nursing home vs how well will she do at home, how is she doing with eating/taking pills  If we discharge: what kind of therapy will she need? Likely will benefit from regular home health visits for therapy/strength as well as medication management   Parkinson Disease Parkinson disease is a long-term (chronic) condition that gets worse over time (is progressive). Parkinson disease limits your ability to control your movements and move your body normally. This condition is a type of movement disorder. Each person with Parkinson disease is affected differently. The condition can range from mild to severe. Parkinson disease tends to progress slowly over several years. What are the causes? Parkinson disease results from a loss of brain cells (neurons) in a specific part of the brain (substantia nigra). Some of the neurons in the substantia nigra make an important brain chemical (dopamine). Dopamine is needed to control movement. As the condition gets worse, neurons make less dopamine. This makes it hard to move or control your movements. The exact cause of why neurons are lost or produce less dopamine is not known. Genetic and environmental factors may contribute to the cause of Parkinson disease. What increases the risk? This condition is more likely to develop in:  Men.  People who are 5 years of age or older.  People who have a family history of Parkinson disease.  What are the signs or symptoms? Symptoms of this condition can vary from person to person. The main (primary) symptoms are related to movement (motor symptoms). These include:  Uncontrolled shaking movements (tremor). Tremors usually start in a hand or foot when you are resting (resting tremor). The tremor may stop when you move around.  Slowing of movement. You may lose facial expression and have trouble making the small movements that are  needed to button clothing or brush your teeth. You may walk with short, shuffling steps.  Stiff movement (rigidity). This mostly affects your arms, legs, neck, and upper body. You may walk without swinging your arms. Rigidity can be painful.  Loss of balance and stability when standing. You may sway, fall backward, and have trouble making turns.  Secondary motor symptoms of this condition include:  Shrinking handwriting.  Stooped posture.  Slowed speech.  Trouble swallowing.  Drooling.  Sexual dysfunction.  Muscle cramps.  Loss of smell.  Additional symptoms that are not related to movement include:  Constipation.  Mood swings.  Depression or anxiety.  Sleep disturbances.  Confusion.  Loss of mental abilities (dementia).  Low blood pressure.  Trouble concentrating.  How is this diagnosed? Parkinson disease can be hard to diagnose in its early stages. A diagnosis may be made based on symptoms, a medical history, and physical exam. During your exam, your health care provider will look for:  Lack of facial expression.  Resting tremor.  Stiffness in your neck, arms, and legs.  Abnormal walk.  Trouble with balance.  You may have brain imaging tests done to check for a loss of dopamine-producing areas of the brain. Your healthcare provider may also grade the severity of your condition as mild, moderate, or advanced. Parkinson disease progression is different for everyone. You may not progress to the advanced stage. Mild Parkinson disease involves:  Movement problems that do not affect daily activities.  Movement problems on one side of the body.  Movement problems that are controlled with medicines.  Good response to exercise.  Moderate Parkinson disease  involves:  Movement problems on both sides of the body.  Slowing of movement.  Coordination and balance problems.  Less of a response to medicine.  More side effects from medicines.  Advanced  Parkinson disease involves:  Extreme difficulty walking.  Inability to live alone safely.  Signs of dementia.  Difficulty controlling symptoms with medicine.  How is this treated? There is no cure for Parkinson disease. Treatment focuses on relieving your symptoms. Treatment may include:  Medicines.  Speech, occupational, and physical therapy.  Surgery.  Everyone responds to medicines differently. Your response may change over time. Work with your health care provider to find the best medicines for you. These may include:  Dopamine replacement drugs. These are the most effective medicines. A long-term side effect of these medicines is uncontrolled movements (dyskinesias).  Dopamine agonists. These drugs act like dopamine to stimulate dopamine receptors in the brain. Side effects include nausea and sleepiness, but they cause less dyskinesia.  Other medicines to reduce tremor, prevent dopamine breakdown, reduce dyskinesia, and reduce dementia that is related to Parkinson disease.  Another treatment is deep brain stimulation surgery to reduce tremors and dyskinesia. This procedure involves placing electrodes in the brain. The electrodes are attached to an electric pulse generator that acts like a pacemaker for your brain. This may be an option if you have had the condition for at least four years and are not responding well to medicines. Follow these instructions at home:  Take over-the-counter and prescription medicines only as told by your health care provider.  Install grab bars and railings in your home to prevent falls.  Follow instructions from your health care provider about eating or drinking restrictions.  Return to your normal activities as told by your health care provider. Ask your health care provider what activities are safe for you.  Get regular exercise as told by your health care provider or a physical therapist.  Keep all follow-up visits as told by your health  care provider. This is important. These include any visits with a speech therapist or occupational therapist.  Consider joining a support group for people with Parkinson disease. Contact a health care provider if:  Medicines do not help your symptoms.  You are unsteady or have fallen at home.  You need more support to function well at home.  You have trouble swallowing.  You have severe constipation.  You are struggling with side effects from your medicines.  You see or hear things that are not real (hallucinate).  You feel confused, anxious, or depressed. Get help right away if:  You are injured after a fall.  You cannot swallow without choking.  You have chest pain or trouble breathing.  You do not feel safe at home. This information is not intended to replace advice given to you by your health care provider. Make sure you discuss any questions you have with your health care provider. Document Released: 10/02/2000 Document Revised: 03/09/2016 Document Reviewed: 07/26/2015 Elsevier Interactive Patient Education  [employer].

## 2018-01-27 ENCOUNTER — Other Ambulatory Visit: Payer: Self-pay | Admitting: *Deleted

## 2018-01-27 LAB — LIPID PANEL
CHOL/HDL RATIO: 3.2 (calc) (ref <5.0)
Cholesterol: 161 mg/dL (ref <200)
HDL: 51 mg/dL (ref >50)
LDL CHOLESTEROL (CALC): 89 mg/dL
Non-HDL Cholesterol (Calc): 110 mg/dL (calc) (ref <130)
Triglycerides: 117 mg/dL (ref <150)

## 2018-01-27 LAB — BASIC METABOLIC PANEL WITH GFR
BUN/Creatinine Ratio: 17 (calc) (ref 6–22)
BUN: 17 mg/dL (ref 7–25)
CALCIUM: 9 mg/dL (ref 8.6–10.4)
CHLORIDE: 104 mmol/L (ref 98–110)
CO2: 33 mmol/L — ABNORMAL HIGH (ref 20–32)
Creat: 0.98 mg/dL — ABNORMAL HIGH (ref 0.60–0.93)
GFR, EST AFRICAN AMERICAN: 64 mL/min/{1.73_m2} (ref >=60)
GFR, EST NON AFRICAN AMERICAN: 55 mL/min/{1.73_m2} — AB (ref >=60)
Glucose, Bld: 86 mg/dL (ref 65–99)
POTASSIUM: 4 mmol/L (ref 3.5–5.3)
Sodium: 145 mmol/L (ref 135–146)

## 2018-01-27 LAB — TSH: TSH: 1.13 m[IU]/L (ref 0.40–4.50)

## 2018-01-27 LAB — HEPATIC FUNCTION PANEL
AG Ratio: 1.9 (calc) (ref 1.0–2.5)
ALBUMIN MSPROF: 3.7 g/dL (ref 3.6–5.1)
ALT: 5 U/L — ABNORMAL LOW (ref 6–29)
AST: 13 U/L (ref 10–35)
Alkaline phosphatase (APISO): 51 U/L (ref 33–130)
BILIRUBIN DIRECT: 0.2 mg/dL (ref 0.0–0.2)
BILIRUBIN INDIRECT: 0.6 mg/dL (ref 0.2–1.2)
BILIRUBIN TOTAL: 0.8 mg/dL (ref 0.2–1.2)
GLOBULIN: 1.9 g/dL (ref 1.9–3.7)
Total Protein: 5.6 g/dL — ABNORMAL LOW (ref 6.1–8.1)

## 2018-01-27 LAB — CBC WITH DIFFERENTIAL/PLATELET
BASOS ABS: 66 {cells}/uL (ref 0–200)
Basophils Relative: 0.8 %
EOS ABS: 498 {cells}/uL (ref 15–500)
Eosinophils Relative: 6 %
HEMATOCRIT: 39.5 % (ref 35.0–45.0)
Hemoglobin: 13.6 g/dL (ref 11.7–15.5)
LYMPHS ABS: 1419 {cells}/uL (ref 850–3900)
MCH: 29.6 pg (ref 27.0–33.0)
MCHC: 34.4 g/dL (ref 32.0–36.0)
MCV: 86.1 fL (ref 80.0–100.0)
MPV: 10.7 fL (ref 7.5–12.5)
Monocytes Relative: 8.9 %
NEUTROS PCT: 67.2 %
Neutro Abs: 5578 cells/uL (ref 1500–7800)
PLATELETS: 238 10*3/uL (ref 140–400)
RBC: 4.59 10*6/uL (ref 3.80–5.10)
RDW: 13.5 % (ref 11.0–15.0)
TOTAL LYMPHOCYTE: 17.1 %
WBC: 8.3 10*3/uL (ref 3.8–10.8)
WBCMIX: 739 {cells}/uL (ref 200–950)

## 2018-01-27 LAB — VITAMIN D 25 HYDROXY (VIT D DEFICIENCY, FRACTURES): VIT D 25 HYDROXY: 45 ng/mL (ref 30–100)

## 2018-01-27 LAB — MAGNESIUM: MAGNESIUM: 2.1 mg/dL (ref 1.5–2.5)

## 2018-01-28 ENCOUNTER — Other Ambulatory Visit: Payer: Self-pay | Admitting: *Deleted

## 2018-01-28 NOTE — Patient Outreach (Signed)
[facility]) Care Management  01/28/2018  Julianna Vanwagner Jergens 10/18/1936 [id number]  Transition of care services week one   On 01/25/18 1230 [facility] CM received a referral for transition of care upon discharge from SNF from [place] facility liaison indicating patient has history of COPD and HF, 4 admissions in 6 months.  Patient may discharge by end of week or early next week.  [facility] packet given to patient spouse at facility CM placed a call to initiate transition of care services  [facility]ans [facility] CM left a HIPPA compliant voice message for Mrs Gilpin  including [place] CM mobile number for a return call.  Pending a possible return call from this patient. CM to send letter per workflow and pend for a second call attempt on next week.   Kimberly L. Lavina Hamman, RN, BSN, [place] Coordinator [phone] week day mobile

## 2018-01-31 ENCOUNTER — Other Ambulatory Visit: Payer: Self-pay | Admitting: *Deleted

## 2018-01-31 NOTE — Patient Outreach (Signed)
Marie [name]) Care Management  01/31/2018  Marie [name] [birth year] [id number]   CSW met with patient & her husband, [name] at bedside at [facility] SNF to introduce self and explain [facility] program. Patient signed consent to be scanned into Epic. Patient's husband informed CSW that they plan to discharge home by this Friday, 4/19 and husband states that he feels comfortable with her returning home - that he has all medical equipment needed (hospital bed, oxygen, etc). Patient's husband states that he takes her to appointments and does not for see any issues there. CSW provided patient & husband with [facility] packet and reviewed services, provided CSW business card & encouraged patient's husband to call once discharge date has been confirmed.    Raynaldo Opitz, LCSW [facility]  Clinical Social Worker cell #: [phone]

## 2018-02-03 ENCOUNTER — Non-Acute Institutional Stay (SKILLED_NURSING_FACILITY): Payer: Medicare Other | Admitting: Internal Medicine

## 2018-02-03 ENCOUNTER — Encounter: Payer: Self-pay | Admitting: Internal Medicine

## 2018-02-03 DIAGNOSIS — J9611 Chronic respiratory failure with hypoxia: Secondary | ICD-10-CM

## 2018-02-03 DIAGNOSIS — G232 Striatonigral degeneration: Secondary | ICD-10-CM | POA: Diagnosis not present

## 2018-02-03 DIAGNOSIS — Z9981 Dependence on supplemental oxygen: Secondary | ICD-10-CM | POA: Diagnosis not present

## 2018-02-03 DIAGNOSIS — I517 Cardiomegaly: Secondary | ICD-10-CM | POA: Diagnosis not present

## 2018-02-03 DIAGNOSIS — K219 Gastro-esophageal reflux disease without esophagitis: Secondary | ICD-10-CM | POA: Diagnosis not present

## 2018-02-03 DIAGNOSIS — F419 Anxiety disorder, unspecified: Secondary | ICD-10-CM | POA: Diagnosis not present

## 2018-02-03 DIAGNOSIS — I5032 Chronic diastolic (congestive) heart failure: Secondary | ICD-10-CM | POA: Diagnosis not present

## 2018-02-03 DIAGNOSIS — R1312 Dysphagia, oropharyngeal phase: Secondary | ICD-10-CM

## 2018-02-03 DIAGNOSIS — F325 Major depressive disorder, single episode, in full remission: Secondary | ICD-10-CM

## 2018-02-03 NOTE — Progress Notes (Signed)
Location:   [facility] Room Number: 155/P Place of Service:  SNF (31)  Provider: Veleta Miners  PCP: [name], MD Patient Care Team: [name], MD as PCP - General (Internal Medicine) Dorna Leitz, MD as Consulting Physician (Orthopedic Surgery) Teena Irani, MD (Inactive) as Consulting Physician (Gastroenterology) Garrel Ridgel, DPM as Consulting Physician (Podiatry) Juanito Doom, MD as Consulting Physician (Pulmonary Disease) Alda Berthold, DO as Consulting Physician (Neurology) Standley Brooking, LCSW as [place] Management (Licensed Clinical Social Worker) Barbaraann Faster, RN as [place] Management [place], Octavio Graves, [facility] as [place] Management (Pharmacist)  Extended Emergency Contact Information Primary Emergency Contact: Rahl,Wayne Address: [address] [place], [place] [address] [place] Phone: [phone] Mobile Phone: [phone] Relation: Spouse Secondary Emergency Contact: [place] Mobile Phone: [phone] Relation: Daughter  Code Status: DNR Goals of care:  Advanced Directive information Advanced Directives 02/03/2018  Does Patient Have a Medical Advance Directive? Yes  Type of Advance Directive Out of facility DNR (pink MOST or yellow form)  Does patient want to make changes to medical advance directive? No - Patient declined  Copy of [place] in Chart? No - copy requested  Would patient like information on creating a medical advance directive? -  Pre-existing out of facility DNR order (yellow form or pink MOST form) -     Allergies  Allergen Reactions  . Demerol  [Meperidine Hcl] Other (See Comments)    Pt. Feels like she is suffocating  . Ertapenem Hives    Caused whole body to turn red Other reaction(s): Redness Caused whole body to turn red  . Codeine Other (See Comments)    hallucinations    . Demerol Other (See Comments)    hallucinations  . Morphine And Related Other (See Comments)    hallucinations  . Other Other (See Comments)    Invan 7- pt became red all over  . Sulfate Other (See Comments)    unknown    Chief Complaint  Patient presents with  . Discharge Note    Discharge Visit    HPI:  82 y.o. Marie [name]   seen today for discharge from SNF.  Patient was admitted for therapy in the facility after staying in the hospital from 03/22-03/26 for COPD exacerbation and possible volume overload.  Patient has a history of COPD Gold stage II with FEV1 of 69%, on as needed oxygen, diastolic heart failure due to LVH Echo in 06/18,, Parkinson disease follows with neurology, oropharyngeal dysphagia, reflux, hypertension and anxiety.    Patient has had recurrent hospital admissions due to COPD 12/18,02/19,03/19. Patient states that she was getting short of breath at home and using her oxygen.  She was treated with IV antibiotic steroid and bronchodilators.  Heart Lasix was increased from 40-60 mg and she was discharged on doxycycline. In the facility blood pressure would run very low .  She does complain of dizziness and weakness.  Her losartan was discontinued and her Lasix was eventually decreased to 20 mg Patient also had episodes of worsening confusion with high white count.  Her chest x-ray showed left lower lobe pneumonia and she was started on Levaquin for 7 days.  She was also started on Zoloft as she was very tearful in the facility to help her depression and anxiety Patient is doing better now. Per therapy she is still Max  assist and is Wheelchair Bound. But they do not think she will make any more  progress due to generalized Frailty.     Past Medical History:  Diagnosis Date  . Balance disorder 2008.     Falls a lot.  She can be standing and then leans too far over to one side   . Bulging disc   . Chronic bronchitis ([place])    due to congestion at times, on prednisone  and advair  . COPD (chronic obstructive pulmonary disease) ([place])   . COPD with acute exacerbation ([place]) 12/15/2017  . Depression    many years ago  . GERD (gastroesophageal reflux disease)   . Hearing loss    mild  . Hyperlipidemia   . Hypertension   . Iatrogenic adrenal insufficiency ([place])   . Incontinence    not indicated at this visit.  Marland Kitchen LVH (left ventricular hypertrophy) due to hypertensive disease    a. 02/2017: echo showing an EF of 65-70% with moderate LVH, hyperdynamic LV with subvalvular gradient of 70 mmHg, SAM, and mild MR  . Neuropathy    legs stay numb   . Osteoarthritis    hands,   . Osteoporosis   . Shortness of breath dyspnea   . Tubulovillous adenoma of rectum   . Wears dentures   . Wears glasses     Past Surgical History:  Procedure Laterality Date  . ABDOMINAL HYSTERECTOMY    . APPENDECTOMY    . CHONDROPLASTY  09/19/2014   Procedure: CHONDROPLASTY;  Surgeon: Alta Corning, MD;  Location: [place] [facility];  Service: Orthopedics;;  . DILATION AND CURETTAGE OF UTERUS    . EXTERNAL FIXATION LEG  10/25/2012   Procedure: EXTERNAL FIXATION LEG;  Surgeon: Rozanna Box, MD;  Location: [facility];  Service: Orthopedics;  Laterality: Right;  . EYE SURGERY     bilateral cataract surgery and lens implant  . FINGER SURGERY     fusions and debridements for OA  . INCONTINENCE SURGERY     multiple procedures, not cured  . KNEE ARTHROSCOPY WITH LATERAL MENISECTOMY Right 09/19/2014   Procedure: KNEE ARTHROSCOPY WITH LATERAL MENISECTOMY;  Surgeon: Alta Corning, MD;  Location: [facility];  Service: Orthopedics;  Laterality: Right;  . KNEE ARTHROSCOPY WITH MEDIAL MENISECTOMY Right 09/19/2014   Procedure: RIGHT KNEE ARTHROSCOPY WITH MEDIAL AND LATERAL MENISECTOMIES. CHONDROPLASTY OF PATELLA-FEMORAL JOINT;  Surgeon: Alta Corning, MD;  Location: [facility];  Service: Orthopedics;  Laterality: Right;  . RECTAL BIOPSY  09/21/2011   Procedure:  BIOPSY RECTAL;  Surgeon: Merrie Roof, MD;  Location: [facility];  Service: General;  Laterality: N/A;  3-4 cm  . RECTAL SURGERY     by dr. Marlou Starks, removal of polyp  . TONSILLECTOMY        reports that she has never smoked. She has never used smokeless tobacco. She reports that she does not drink alcohol or use drugs. Social History   Socioeconomic History  . Marital status: Married    Spouse name: Not on file  . Number of children: 1  . Years of education: 57  . Highest education level: Not on file  Occupational History  . Not on file  Social Needs  . Financial resource strain: Not on file  . Food insecurity:    Worry: Not on file    Inability: Not on file  . Transportation needs:    Medical: Not on file    Non-medical: Not  on file  Tobacco Use  . Smoking status: Never Smoker  . Smokeless tobacco: Never Used  Substance and Sexual Activity  . Alcohol use: No  . Drug use: No  . Sexual activity: Not on file  Lifestyle  . Physical activity:    Days per week: Not on file    Minutes per session: Not on file  . Stress: Not on file  Relationships  . Social connections:    Talks on phone: Not on file    Gets together: Not on file    Attends religious service: Not on file    Active member of club or organization: Not on file    Attends meetings of clubs or organizations: Not on file    Relationship status: Not on file  . Intimate partner violence:    Fear of current or ex partner: Not on file    Emotionally abused: Not on file    Physically abused: Not on file    Forced sexual activity: Not on file  Other Topics Concern  . Not on file  Social History Narrative   Lives in a house that is mostly single story.  She uses a cane and a walker which she uses for ambulation.  Drives.  Lives with husband.  Has one daughter.  Retired Building control surveyor.  Education: high school.   Functional Status Survey:    Allergies  Allergen Reactions  . Demerol  [Meperidine Hcl] Other (See Comments)      Pt. Feels like she is suffocating  . Ertapenem Hives    Caused whole body to turn red Other reaction(s): Redness Caused whole body to turn red  . Codeine Other (See Comments)    hallucinations  . Demerol Other (See Comments)    hallucinations  . Morphine And Related Other (See Comments)    hallucinations  . Other Other (See Comments)    Invan 7- pt became red all over  . Sulfate Other (See Comments)    unknown    Pertinent  Health Maintenance Due  Topic Date Due  . INFLUENZA VACCINE  05/19/2018  . DEXA SCAN  Completed  . PNA vac Low Risk Adult  Completed    Medications: Outpatient Encounter Medications as of 02/03/2018  Medication Sig  . acetaminophen (TYLENOL) 325 MG tablet Take 650 mg by mouth every 6 (six) hours as needed.  Marland Kitchen albuterol (PROVENTIL) (2.5 MG/3ML) 0.083% nebulizer solution Use 1 ampule in nebulizer 4 times a day or every 4 hours as needed to rescue asthma.  . ALPRAZolam (XANAX) 0.5 MG tablet Take 1 tablet (0.5 mg total) by mouth 3 (three) times daily as needed for anxiety.  Marland Kitchen aspirin 81 MG tablet Take 81 mg by mouth daily. Buys OTC  . Balsam [place]-Castor Oil (VENELEX) OINT Apply to sacrum and bilateral buttocks q shift for prevention and prn s/p incontinence or erythema  every shift  . budesonide (PULMICORT) 0.5 MG/2ML nebulizer solution Take 2 mLs (0.5 mg total) by nebulization 2 (two) times daily. Dx-J45.909, J44.0, J20.9  . carbidopa-levodopa (SINEMET IR) 25-100 MG tablet Take 1.5 tablets by mouth 2 (two) times daily.  . Cholecalciferol (VITAMIN D) 2000 units CAPS Take 2,000 capsules by mouth daily.  Marland Kitchen ENSURE (ENSURE) Take 237 mLs by mouth daily.  . famotidine (PEPCID) 20 MG tablet Take 20 mg by mouth 2 (two) times daily.   . formoterol (PERFOROMIST) 20 MCG/2ML nebulizer solution Take 2 mLs (20 mcg total) by nebulization 2 (two) times daily.  . furosemide (LASIX)  20 MG tablet Take 20 mg by mouth daily.  Marland Kitchen omeprazole (PRILOSEC) 40 MG capsule Take 1 capsule  daily for Acid Reflux  . polyethylene glycol (MIRALAX / GLYCOLAX) packet Take 17 g by mouth daily as needed for mild constipation.  . potassium chloride (KLOR-CON) 20 MEQ packet Take 20 mEq by mouth daily.   Marland Kitchen PROCTOZONE-HC 2.5 % rectal cream Apply 1 application topically as needed for hemorrhoids.   . sertraline (ZOLOFT) 25 MG tablet Take 25 mg by mouth daily.  . [DISCONTINUED] Cholecalciferol 4000 units CAPS Take 1 capsule (4,000 Units total) by mouth daily.   No facility-administered encounter medications on file as of 02/03/2018.      Review of Systems  Vitals:   02/03/18 1338  BP: (!) 97/54  Pulse: (!) 58  Resp: 14  Temp: (!) 97 F (36.1 C)  TempSrc: Oral  SpO2: 98%   There is no height or weight on file to calculate BMI. Physical Exam  Constitutional: She appears well-developed. She appears cachectic.  HENT:  Head: Normocephalic.  Mouth/Throat: Oropharynx is clear and moist.  Eyes: Pupils are equal, round, and reactive to light.  Neck: Normal range of motion.  Cardiovascular: Normal rate and regular rhythm.  Murmur heard. Pulmonary/Chest: Effort normal and breath sounds normal. No stridor. No respiratory distress. She has no wheezes. She has no rales.  Abdominal: Soft. Bowel sounds are normal. She exhibits no distension. There is no tenderness. There is no guarding.  Musculoskeletal:  Trace edema Bilateral  Lymphadenopathy:    She has no cervical adenopathy.  Neurological: She is alert.  Skin: Skin is warm and dry.  Psychiatric: She has a normal mood and affect. Her behavior is normal.    Labs reviewed: Basic Metabolic Panel: Recent Labs    10/28/17 1527  01/10/18 0633  01/18/18 0700 01/24/18 1000 01/26/18 1107  NA  --    < > 143   < > 140 140 145  K  --    < > 4.1   < > 4.6 4.0 4.0  CL  --    < > 108   < > 103 104 104  CO2  --    < > 28   < > 26 27 33*  GLUCOSE  --    < > 83   < > 87 133* 86  BUN  --    < > 34*   < > 25* 16 17  CREATININE  --    < >  0.96   < > 1.00 0.93 0.98*  CALCIUM  --    < > 8.5*   < > 8.9 8.7* 9.0  MG 2.2  --  2.3  --   --   --  2.1   < > = values in this interval not displayed.   Liver Function Tests: Recent Labs    08/05/17 0912 12/15/17 0216 01/07/18 1722 01/26/18 1107  AST 21 19 16 13   ALT 5* 12* 12* 5*  ALKPHOS 57 48 49  --   BILITOT 0.8 1.2 1.3* 0.8  PROT 6.3* 6.0* 6.5 5.6*  ALBUMIN 3.8 3.7 3.8  --    No results for input(s): LIPASE, AMYLASE in the last 8760 hours. No results for input(s): AMMONIA in the last 8760 hours. CBC: Recent Labs    01/12/18 0749 01/24/18 1000 01/26/18 1107  WBC 8.2 10.6* 8.3  NEUTROABS 4.3 8.6* 5,578  HGB 15.2* 13.2 13.6  HCT 48.2* 41.7 39.5  MCV 91.8 91.9  86.1  PLT 227 221 238   Cardiac Enzymes: Recent Labs    07/20/17 1050 09/20/17 0727 01/07/18 1755  TROPONINI <0.03 <0.03 <0.03   BNP: Invalid input(s): POCBNP CBG: No results for input(s): GLUCAP in the last 8760 hours.  Procedures and Imaging Studies During Stay: Dg Chest 2 View  Result Date: 01/24/2018 CLINICAL DATA:  Shortness of breath for 1 day EXAM: CHEST - 2 VIEW COMPARISON:  01/07/2018 FINDINGS: Cardiac shadow is stable. Aortic calcifications are again seen. Mild interstitial changes are noted bilaterally. Some increased left retrocardiac density is noted consistent with early infiltrate. IMPRESSION: Early left retrocardiac infiltrate. Electronically Signed   By: Inez Catalina M.D.   On: 01/24/2018 16:23   Dg Chest Port 1 View  Result Date: 01/07/2018 CLINICAL DATA:  Worsening shortness of breath EXAM: PORTABLE CHEST 1 VIEW COMPARISON:  12/15/2017, 10/20/2016 FINDINGS: Mild chronic bronchitic changes. No consolidation or effusion. Stable cardiomediastinal silhouette with aortic atherosclerosis. No pneumothorax. IMPRESSION: No active disease. Electronically Signed   By: Donavan Foil M.D.   On: 01/07/2018 17:44    Assessment/Plan:    COPD with acute exacerbation  On albuterol ,budesonide and  Perforomist  She is follow-up with pulmonology But unable to taper her oxygen she would stay on 2 L at home  History of hypertension Patient was on  olmesartan .   Which was discontinued in the facility due to symptomatic low blood pressure  Chronic diastolic CHF (congestive heart failure) Patient's Lasix was  decreased to 20 mg  due to low blood pressures, increased BUN, dizziness  patient will follow her weight daily  and will report any increase of 5 pounds in a week  Urinary retention It seems like patient had urinary retention in the hospital She had a Foley catheter placed.     Made in the facility patient did not have any problems with retention Oropharyngeal dysphagia due to [place] Patient had evaluation done by speech in the hospital and is on D3 diet  GERD without esophagitis On chronic Protonix and H2 blocker  Parkinson's Disease On Sinemet Follows with neurology  Anxiety Will  continuedon Xanax as needed she was also started on Zoloft 25 mg  Disposition Discussed with the husband and patient  She is eager to go home she states she does not think she needs any more therapy Patient will be discharged home with home therapy We will follow with her PCP closely Patient is going home with home health, PT OT, nursing and CNA   Future labs/tests needed:   Discharge time More then 30 min

## 2018-02-04 ENCOUNTER — Other Ambulatory Visit: Payer: Self-pay | Admitting: *Deleted

## 2018-02-04 NOTE — Patient Outreach (Signed)
[facility]) Care Management  02/04/2018  Marie [name] 23-Nov-1935 [id number]   Care coordination   [facility] CM called to attempt to start transition of care but [name] confirmed with CM that [name] has not been discharged from the snf at this time.  He reports she may possibly be discharged on 01/2018   [facility] CM will attempt again next week to return a call to [name] for transition of care services   [place]. Lavina Hamman, RN, BSN, [place] Coordinator [phone] week day mobile

## 2018-02-06 DIAGNOSIS — F0280 Dementia in other diseases classified elsewhere without behavioral disturbance: Secondary | ICD-10-CM | POA: Diagnosis not present

## 2018-02-06 DIAGNOSIS — M810 Age-related osteoporosis without current pathological fracture: Secondary | ICD-10-CM | POA: Diagnosis not present

## 2018-02-06 DIAGNOSIS — G629 Polyneuropathy, unspecified: Secondary | ICD-10-CM | POA: Diagnosis not present

## 2018-02-06 DIAGNOSIS — G20 Parkinson's disease: Secondary | ICD-10-CM | POA: Diagnosis not present

## 2018-02-06 DIAGNOSIS — E440 Moderate protein-calorie malnutrition: Secondary | ICD-10-CM | POA: Diagnosis not present

## 2018-02-06 DIAGNOSIS — I5032 Chronic diastolic (congestive) heart failure: Secondary | ICD-10-CM | POA: Diagnosis not present

## 2018-02-06 DIAGNOSIS — J9621 Acute and chronic respiratory failure with hypoxia: Secondary | ICD-10-CM | POA: Diagnosis not present

## 2018-02-06 DIAGNOSIS — Z9181 History of falling: Secondary | ICD-10-CM | POA: Diagnosis not present

## 2018-02-06 DIAGNOSIS — Z9981 Dependence on supplemental oxygen: Secondary | ICD-10-CM | POA: Diagnosis not present

## 2018-02-06 DIAGNOSIS — I110 Hypertensive heart disease with heart failure: Secondary | ICD-10-CM | POA: Diagnosis not present

## 2018-02-06 DIAGNOSIS — J441 Chronic obstructive pulmonary disease with (acute) exacerbation: Secondary | ICD-10-CM | POA: Diagnosis not present

## 2018-02-07 ENCOUNTER — Other Ambulatory Visit: Payer: Self-pay | Admitting: *Deleted

## 2018-02-07 ENCOUNTER — Other Ambulatory Visit: Payer: Self-pay

## 2018-02-07 NOTE — Patient Outreach (Signed)
[facility]) Care Management  02/07/2018  Marie [name] 10-Dec-1935 [id number]   CSW called & spoke with patient's husband, Marie [name] who informed CSW that patient had discharged home from [facility] this past Saturday, 02/05/18 and has been doing well so far at home. Patient has been using the walker and her husband is home to assist her as well. CSW will sign off as no further CSW needs identified at this time.    Raynaldo Opitz, LCSW [facility]  Clinical Social Worker cell #: [phone]

## 2018-02-07 NOTE — Patient Outreach (Addendum)
[name]) Care Management  [place]   02/07/2018  Marie [name] 02-10-1936 [id number]  82 year old female referred to [place] Management by for transition of care services following recent hospitalization and SNF. [place] services requested for medication reconcilitation.  PMHx includes, but not limited to, COPD, chronic diastolic heart failure, Parkinson's disease with oropharyngeal dysphagia, neuropathy, GERD, HTN, urinary retention, depression, and anxiety.  Noted patient has had multiple admissions in the last 4 months related to COPD exacerbations.    Successful outreach call to Marie [name]'s husband, Marie [name].  HIPAA identifiers verified.    Incoming call received from Marie [name]'s daughter, Marie [name] asking if her mom could get some distilled water to add humidity to her oxygen.  States she had that in the nursing home and it helped cut down on cough.  Daughter is not sure what is compatible with her oxygen and how to hook it up.    Subjective: Husband states that his wife is home now from SNF and doing "pretty good." Patient's daughter and husband both verbalize that they do not have trouble affording Marie [name]'s prescriptions.  The daughter states that she does not have any difficulty filling her mom's pill box.   Objective: from 01/26/18 SCr 0.98 mg/dL Cholesterol 161 mg/dL, LDL 89 mg/dL, HDL, 51 mg/dL Glucose 86 mg/dL  Current Medications: Current Outpatient Medications  Medication Sig Dispense Refill  . acetaminophen (TYLENOL) 325 MG tablet Take 650 mg by mouth every 6 (six) hours as needed.    Marland Kitchen albuterol (PROVENTIL) (2.5 MG/3ML) 0.083% nebulizer solution Use 1 ampule in nebulizer 4 times a day or every 4 hours as needed to rescue asthma. 360 mL 3  . ALPRAZolam (XANAX) 0.5 MG tablet Take 1 tablet (0.5 mg total) by mouth 3 (three) times daily as needed for anxiety. 30 tablet 0  . aspirin 81 MG tablet Take 81 mg by mouth daily. Buys OTC    .  budesonide (PULMICORT) 0.5 MG/2ML nebulizer solution Take 2 mLs (0.5 mg total) by nebulization 2 (two) times daily. Dx-J45.909, J44.0, J20.9 180 mL 1  . carbidopa-levodopa (SINEMET IR) 25-100 MG tablet Take 1.5 tablets by mouth 2 (two) times daily. 270 tablet 3  . Cholecalciferol (VITAMIN D) 2000 units CAPS Take 2,000 capsules by mouth daily.    Marland Kitchen ENSURE (ENSURE) Take 237 mLs by mouth daily.    . formoterol (PERFOROMIST) 20 MCG/2ML nebulizer solution Take 2 mLs (20 mcg total) by nebulization 2 (two) times daily. 360 mL 2  . furosemide (LASIX) 20 MG tablet Take 20 mg by mouth daily.    Marland Kitchen omeprazole (PRILOSEC) 40 MG capsule Take 40 mg by mouth daily as needed (reflux,).     . potassium chloride SA (K-DUR,KLOR-CON) 20 MEQ tablet Take 20 mEq by mouth once.    . sertraline (ZOLOFT) 25 MG tablet Take 25 mg by mouth daily.    Marie [name] (VENELEX) OINT Apply to sacrum and bilateral buttocks q shift for prevention and prn s/p incontinence or erythema  every shift    . famotidine (PEPCID) 20 MG tablet Take 20 mg by mouth 2 (two) times daily.     . polyethylene glycol (MIRALAX / GLYCOLAX) packet Take 17 g by mouth daily as needed for mild constipation. (Patient not taking: Reported on 02/07/2018)     No current facility-administered medications for this visit.     Functional Status: In your present state of health, do you have any difficulty performing  the following activities: 01/07/2018 12/15/2017  Hearing? Y Y  Comment - -  Vision? N N  Difficulty concentrating or making decisions? N N  Walking or climbing stairs? Y Y  Comment - -  Dressing or bathing? N Y  Comment - -  Doing errands, shopping? Y [place] and eating ? - -  Using the Toilet? - -  In the past six months, have you accidently leaked urine? - -  Comment - -  Do you have problems with loss of bowel control? - -  Managing your Medications? - -  Managing your Finances? - -  Housekeeping or managing  your Housekeeping? - -  Comment - -  Some recent data might be hidden    Fall/Depression Screening: Fall Risk  11/18/2017 10/28/2017 10/20/2017  Falls in the past year? No Yes No  Number falls in past yr: - 2 or more -  Injury with Fall? No No -  Risk Factor Category  - - -  Comment - - -  Risk for fall due to : History of fall(s);Impaired balance/gait;Impaired mobility Impaired balance/gait;History of fall(s);Impaired mobility History of fall(s);Impaired balance/gait;Impaired mobility  Risk for fall due to: Comment - - -  Follow up Falls evaluation completed Falls evaluation completed;Falls prevention discussed -   PHQ 2/9 Scores 11/18/2017 10/28/2017 10/20/2017 02/17/2017 09/22/2016 04/28/2016 10/16/2015  PHQ - 2 Score 0 0 0 0 0 0 0   ASSESSMENT: Date Discharged from Hospital: 02/04/18 Date Medication Reconciliation Performed: 02/07/2018  Medications discontinued in facility. Olmesartan  Medications changed in the facility. Furosemide decreased to 20 mg daily.  Medications added in the facility. Sertraline 25 mg daily.  Patient was recently discharged from SNF and all medications have been reviewed  Drugs sorted by system:  Neurologic/Psychologic: alprazolam, carbidopa/levodopa, sertraline  Cardiovascular: aspirin, furosemide, potassium chloride,   Pulmonary/Allergy: albuterol neb, budesonide neb, formoterol neb  Gastrointestinal: omeprazole, famotidine, polyethylene glycol,   Topical: Venelex ointment  Pain: acetaminophen,   Vitamins/Minerals: cholecalciferol,  Miscellaneous: ensure  Duplications in therapy: omperazole and famotidine for GERD,  Famotidine not in home, but family questions if she is to be on both.  Dr. Steve Rattler note from SNF indicates both are prescribed.    Medications to avoid in the elderly: Per Beer's List, Benzodiazepines such as alprazolam may have decreased metabolism, and increased sensitivity in the elderly, resulting in cognitive impairment,  delirium, falls and fractures.  There is strong recommendation to avoid use in the elderly.   I informed daughter that I would close [name]'s [facility] case as she has no other medication needs at this time.  Stated that I will be glad to help in the future if any medication needs arise.   PLAN: Route note, to PCP, Dr. Melford Aase, to clarify if patient is to be on famotidine and omeprazole for GERD.  Daughter states she will follow up with Dr. Melford Aase at Mrs. Beckmann's 5/2 visit if she has not heard back before then.  Route PCP, MD discipline closure letter.  No pharmacy needs at this time.  In-basket message to Joellyn Quails, [facility] RN, asking for assistance with patient getting distilled water to go with her oxygen.   Joetta Manners, PharmD Clinical Pharmacist [place] [phone]

## 2018-02-08 ENCOUNTER — Other Ambulatory Visit: Payer: Self-pay | Admitting: *Deleted

## 2018-02-08 ENCOUNTER — Other Ambulatory Visit: Payer: Self-pay

## 2018-02-08 DIAGNOSIS — I5032 Chronic diastolic (congestive) heart failure: Secondary | ICD-10-CM | POA: Diagnosis not present

## 2018-02-08 DIAGNOSIS — G20 Parkinson's disease: Secondary | ICD-10-CM | POA: Diagnosis not present

## 2018-02-08 DIAGNOSIS — J441 Chronic obstructive pulmonary disease with (acute) exacerbation: Secondary | ICD-10-CM | POA: Diagnosis not present

## 2018-02-08 DIAGNOSIS — I110 Hypertensive heart disease with heart failure: Secondary | ICD-10-CM | POA: Diagnosis not present

## 2018-02-08 DIAGNOSIS — J9621 Acute and chronic respiratory failure with hypoxia: Secondary | ICD-10-CM | POA: Diagnosis not present

## 2018-02-08 DIAGNOSIS — F0280 Dementia in other diseases classified elsewhere without behavioral disturbance: Secondary | ICD-10-CM | POA: Diagnosis not present

## 2018-02-08 NOTE — Patient Outreach (Signed)
[facility]) Care Management  02/08/2018  Marie [name] 09/23/1936 [id number]  Transition of care week 1  Marie [name] is transitioning from snf to home Hx of COPD and CHF with 4 admissions in the last 6 months  She and her husband reports doing well since discharge on 02/05/18 Marie [name] and Marie [name], Daughter has been taking turns participating with Marie [name]'s care  Marie [name] reports contact from home health services with a visit on today.  She also reports initiating ambulation today   When Cm inquired about COPD dx Marie [name] mentioned that she has never smoked Cm discussed other causes of COPD. CHF - With Assessment most symptoms were denied except a edema of right lower extremity on "yesterday" but not today and a remaining dry hacky cough.  CM discussed the CHF yellow zone s/s to be aware of and who to report s/s to .  Marie [name] reports understanding  Medicines With review of her medicines All medicines confirmed except Zoloft   Marie Croston confirms is will be able to take Marie [name] to see Dr Idell Pickles NP Caryl Pina on Feb 17 2018 for her hospital follow up appointment   Plans CM discussed transition of care services, home visits, Transition of care assessment completed CM will follow up with a home visit this week and weekly contact Confirmed that patient hasmy direct phone number and the [facility] CM 24-hour nurse advice phone number should issues arise prior to CM home visit Routed note to Epic listed MDs Noted referral to [place] and case closure of [facility] SW (per SW message to [place])  Letters for successful outreach to patient and barriers to MD  [facility] CM Care Plan Problem One     Most Recent Value  Care Plan Problem One  RIsk for hospital re admission related to recent 4 re admissions (COPD, CHF)  Role Documenting the Problem One  Care Management Coordinator  Care Plan for Problem One  Active  [facility] Long Term Goal   over the next 31 days patient will not experience  hospital re admission, as evidence by patient/family reporting and review of EPIC [name]  [facility] Long Term Goal Start Date  02/08/18  Interventions for Problem One Long Term Goal  transition of care assessment completed, sent noted concerns to MD in EPIC note, discussed current s/s of CHF to be aware of for monitoring and who to contact, reviewed upcoming appointments, medications,   [facility] CM Short Term Goal #1   over the next 14 days patient will have resolving chf s/s or report to MD as recommended as reported by patient/family during contact/visit  [facility] CM Short Term Goal #1 Start Date  02/08/18  Interventions for Short Term Goal #1  transition of care assessment completed, sent noted concerns to MD in EPIC note, discussed current s/s of CHF to be aware of for monitoring and who to contact, reviewed upcoming appointments, medications,        Ardyth Kelso L. Lavina Hamman, RN, BSN, [place] Coordinator [phone] week day mobile

## 2018-02-09 DIAGNOSIS — I110 Hypertensive heart disease with heart failure: Secondary | ICD-10-CM | POA: Diagnosis not present

## 2018-02-09 DIAGNOSIS — J9621 Acute and chronic respiratory failure with hypoxia: Secondary | ICD-10-CM | POA: Diagnosis not present

## 2018-02-09 DIAGNOSIS — G20 Parkinson's disease: Secondary | ICD-10-CM | POA: Diagnosis not present

## 2018-02-09 DIAGNOSIS — I5032 Chronic diastolic (congestive) heart failure: Secondary | ICD-10-CM | POA: Diagnosis not present

## 2018-02-09 DIAGNOSIS — J441 Chronic obstructive pulmonary disease with (acute) exacerbation: Secondary | ICD-10-CM | POA: Diagnosis not present

## 2018-02-09 DIAGNOSIS — F0280 Dementia in other diseases classified elsewhere without behavioral disturbance: Secondary | ICD-10-CM | POA: Diagnosis not present

## 2018-02-10 ENCOUNTER — Other Ambulatory Visit: Payer: Self-pay | Admitting: *Deleted

## 2018-02-10 DIAGNOSIS — J9621 Acute and chronic respiratory failure with hypoxia: Secondary | ICD-10-CM | POA: Diagnosis not present

## 2018-02-10 DIAGNOSIS — J441 Chronic obstructive pulmonary disease with (acute) exacerbation: Secondary | ICD-10-CM | POA: Diagnosis not present

## 2018-02-10 DIAGNOSIS — F0280 Dementia in other diseases classified elsewhere without behavioral disturbance: Secondary | ICD-10-CM | POA: Diagnosis not present

## 2018-02-10 DIAGNOSIS — I110 Hypertensive heart disease with heart failure: Secondary | ICD-10-CM | POA: Diagnosis not present

## 2018-02-10 DIAGNOSIS — G20 Parkinson's disease: Secondary | ICD-10-CM | POA: Diagnosis not present

## 2018-02-10 DIAGNOSIS — I5032 Chronic diastolic (congestive) heart failure: Secondary | ICD-10-CM | POA: Diagnosis not present

## 2018-02-10 NOTE — Patient Outreach (Addendum)
[facility]) Care Management   02/10/2018  Syrena Burges Emmer 06/20/1936 [id number]  Oyuki Hogan Cohron is an 82 y.o. female who was referred to [facility] CM after her discharge from the [place] center for transition of care services  Subjective:  See notes below   Objective:   BP 118/72   Pulse 89   Temp (!) 96.2 F (35.7 C) (Oral)   Resp 20   SpO2 93%  Pulse ws 89 and SpO2 was 87% initially  Review of Systems  Constitutional: Positive for malaise/fatigue. Negative for chills, diaphoresis, fever and weight loss.  HENT: Negative for congestion, ear discharge, ear pain, hearing loss, nosebleeds, sinus pain, sore throat and tinnitus.   Eyes: Negative.  Negative for blurred vision, double vision, photophobia, pain, discharge and redness.  Respiratory: Positive for shortness of breath. Negative for cough, hemoptysis, sputum production, wheezing and stridor.   Cardiovascular: Negative for chest pain, palpitations, orthopnea, claudication and PND.  Gastrointestinal: Negative for abdominal pain, blood in stool, constipation, diarrhea, heartburn, melena, nausea and vomiting.  Genitourinary: Negative for dysuria, flank pain, frequency, hematuria and urgency.  Musculoskeletal: Positive for joint pain. Negative for back pain, falls, myalgias and neck pain.  Skin: Negative.  Negative for itching and rash.  Neurological: Positive for weakness. Negative for dizziness, tingling, tremors, sensory change, speech change, focal weakness, seizures, loss of consciousness and headaches.  Endo/Heme/Allergies: Positive for environmental allergies. Negative for polydipsia. Bruises/bleeds easily.  Psychiatric/Behavioral: Negative for depression, hallucinations, memory loss, substance abuse and suicidal ideas. The patient is nervous/anxious. The patient does not have insomnia.     Physical Exam  Constitutional: She is oriented to person, place, and time. She appears well-developed and  well-nourished.  HENT:  Head: Normocephalic and atraumatic.  Eyes: Pupils are equal, round, and reactive to light. Conjunctivae are normal.  Neck: Normal range of motion. Neck supple.  Cardiovascular: Normal rate and regular rhythm.  Respiratory: Effort normal and breath sounds normal.  GI: Soft. Bowel sounds are normal.  Neurological: She is alert and oriented to person, place, and time.  Skin: Skin is warm and dry.  Psychiatric: She has a normal mood and affect. Her behavior is normal. Judgment and thought content normal.    Encounter Medications:   Outpatient Encounter Medications as of 02/10/2018  Medication Sig Note  . acetaminophen (TYLENOL) 325 MG tablet Take 650 mg by mouth every 6 (six) hours as needed.   Marland Kitchen albuterol (PROVENTIL) (2.5 MG/3ML) 0.083% nebulizer solution Use 1 ampule in nebulizer 4 times a day or every 4 hours as needed to rescue asthma.   . ALPRAZolam (XANAX) 0.5 MG tablet Take 1 tablet (0.5 mg total) by mouth 3 (three) times daily as needed for anxiety.   Marland Kitchen aspirin 81 MG tablet Take 81 mg by mouth daily. Buys OTC   . Balsam Peru-Castor Oil (VENELEX) OINT Apply to sacrum and bilateral buttocks q shift for prevention and prn s/p incontinence or erythema  every shift   . budesonide (PULMICORT) 0.5 MG/2ML nebulizer solution Take 2 mLs (0.5 mg total) by nebulization 2 (two) times daily. Dx-J45.909, J44.0, J20.9   . carbidopa-levodopa (SINEMET IR) 25-100 MG tablet Take 1.5 tablets by mouth 2 (two) times daily.   . Cholecalciferol (VITAMIN D) 2000 units CAPS Take 2,000 capsules by mouth daily.   Marland Kitchen ENSURE (ENSURE) Take 237 mLs by mouth daily.   . famotidine (PEPCID) 20 MG tablet Take 20 mg by mouth 2 (two) times daily.    . formoterol (  PERFOROMIST) 20 MCG/2ML nebulizer solution Take 2 mLs (20 mcg total) by nebulization 2 (two) times daily.   . furosemide (LASIX) 20 MG tablet Take 20 mg by mouth daily.   Marland Kitchen omeprazole (PRILOSEC) 40 MG capsule Take 40 mg by mouth daily as  needed (reflux,).    . polyethylene glycol (MIRALAX / GLYCOLAX) packet Take 17 g by mouth daily as needed for mild constipation.   . potassium chloride SA (K-DUR,KLOR-CON) 20 MEQ tablet Take 20 mEq by mouth once.   . sertraline (ZOLOFT) 25 MG tablet Take 25 mg by mouth daily. 02/08/2018: Mr Lueth unable to confirm use of medication on 02/08/18 with [facility] CM    No facility-administered encounter medications on file as of 02/10/2018.     Functional Status:   In your present state of health, do you have any difficulty performing the following activities: 01/07/2018 12/15/2017  Hearing? Y Y  Comment - -  Vision? N N  Difficulty concentrating or making decisions? N N  Walking or climbing stairs? Y Y  Comment - -  Dressing or bathing? N Y  Comment - -  Doing errands, shopping? Y [place] and eating ? - -  Using the Toilet? - -  In the past six months, have you accidently leaked urine? - -  Comment - -  Do you have problems with loss of bowel control? - -  Managing your Medications? - -  Managing your Finances? - -  Housekeeping or managing your Housekeeping? - -  Comment - -  Some recent data might be hidden    Fall/Depression Screening:    Fall Risk  11/18/2017 10/28/2017 10/20/2017  Falls in the past year? No Yes No  Number falls in past yr: - 2 or more -  Injury with Fall? No No -  Risk Factor Category  - - -  Comment - - -  Risk for fall due to : History of fall(s);Impaired balance/gait;Impaired mobility Impaired balance/gait;History of fall(s);Impaired mobility History of fall(s);Impaired balance/gait;Impaired mobility  Risk for fall due to: Comment - - -  Follow up Falls evaluation completed Falls evaluation completed;Falls prevention discussed -   PHQ 2/9 Scores 11/18/2017 10/28/2017 10/20/2017 02/17/2017 09/22/2016 04/28/2016 10/16/2015  PHQ - 2 Score 0 0 0 0 0 0 0    Assessment:    96.2 87-157 20 87-93% were her initial VS Encouraged use of her oxygen during  the day.   Cm met with Mr & Mrs Pitera at their home for the initial home visit  CM discussed the answer Jan, her daughter inquired about related to distilled water for her [place] home health aide arrived to assist Mrs Loch with a bath during the home visit. Mrs Allor was not wearing oxygen and was noted to be sob after her shower. Her breathing became WNL after rest.  DME from [facility]  Medications reviewed  Plan:  Cm to follow up with Mrs Waldman for transition of care services in 1-2 weeks  Spoke with [facility] at [facility] related to the need for a concentrator with a humidifier. Maryruth Hancock confirms that Mrs Mcmasters will not need a humidifier because she is only at 2 L [place]. Humidifiers are used for patients who are on 6 L [place] or higher. This was discussed with Mr Earll and his daughter, Mary Sella CM assisted in coordination of an order of monthly oxygen tubing for May 2019      Joelene Millin  Yetta Glassman, RN, BSN, CCM [facility] Care Management Care Coordinator [phone] week day mobile

## 2018-02-11 DIAGNOSIS — J9621 Acute and chronic respiratory failure with hypoxia: Secondary | ICD-10-CM | POA: Diagnosis not present

## 2018-02-11 DIAGNOSIS — F0280 Dementia in other diseases classified elsewhere without behavioral disturbance: Secondary | ICD-10-CM | POA: Diagnosis not present

## 2018-02-11 DIAGNOSIS — I5032 Chronic diastolic (congestive) heart failure: Secondary | ICD-10-CM | POA: Diagnosis not present

## 2018-02-11 DIAGNOSIS — I110 Hypertensive heart disease with heart failure: Secondary | ICD-10-CM | POA: Diagnosis not present

## 2018-02-11 DIAGNOSIS — G20 Parkinson's disease: Secondary | ICD-10-CM | POA: Diagnosis not present

## 2018-02-11 DIAGNOSIS — J441 Chronic obstructive pulmonary disease with (acute) exacerbation: Secondary | ICD-10-CM | POA: Diagnosis not present

## 2018-02-14 ENCOUNTER — Other Ambulatory Visit: Payer: Self-pay | Admitting: Internal Medicine

## 2018-02-14 ENCOUNTER — Ambulatory Visit: Payer: Self-pay | Admitting: Pharmacist

## 2018-02-14 ENCOUNTER — Other Ambulatory Visit: Payer: Self-pay | Admitting: *Deleted

## 2018-02-14 ENCOUNTER — Telehealth: Payer: Self-pay | Admitting: *Deleted

## 2018-02-14 ENCOUNTER — Encounter: Payer: Self-pay | Admitting: Pharmacist

## 2018-02-14 DIAGNOSIS — I110 Hypertensive heart disease with heart failure: Secondary | ICD-10-CM | POA: Diagnosis not present

## 2018-02-14 DIAGNOSIS — J441 Chronic obstructive pulmonary disease with (acute) exacerbation: Secondary | ICD-10-CM | POA: Diagnosis not present

## 2018-02-14 DIAGNOSIS — J9621 Acute and chronic respiratory failure with hypoxia: Secondary | ICD-10-CM | POA: Diagnosis not present

## 2018-02-14 DIAGNOSIS — F419 Anxiety disorder, unspecified: Secondary | ICD-10-CM

## 2018-02-14 DIAGNOSIS — F0280 Dementia in other diseases classified elsewhere without behavioral disturbance: Secondary | ICD-10-CM | POA: Diagnosis not present

## 2018-02-14 DIAGNOSIS — G20 Parkinson's disease: Secondary | ICD-10-CM | POA: Diagnosis not present

## 2018-02-14 DIAGNOSIS — I5032 Chronic diastolic (congestive) heart failure: Secondary | ICD-10-CM | POA: Diagnosis not present

## 2018-02-14 MED ORDER — POTASSIUM CHLORIDE CRYS ER 20 MEQ PO TBCR: EXTENDED_RELEASE_TABLET | ORAL | 1 refills | Status: DC

## 2018-02-14 MED ORDER — PREDNISONE 20 MG PO TABS: ORAL_TABLET | ORAL | 0 refills | Status: DC

## 2018-02-14 MED ORDER — ALPRAZOLAM 0.5 MG PO TABS: ORAL_TABLET | ORAL | 2 refills | Status: DC

## 2018-02-14 MED ORDER — SERTRALINE HCL 25 MG PO TABS: 25.0000 mg | ORAL_TABLET | Freq: Every day | ORAL | 1 refills | Status: DC

## 2018-02-14 MED ORDER — AZITHROMYCIN 250 MG PO TABS: ORAL_TABLET | ORAL | 1 refills | Status: DC

## 2018-02-14 MED ORDER — BUDESONIDE 0.5 MG/2ML IN SUSP: 0.5000 mg | Freq: Two times a day (BID) | RESPIRATORY_TRACT | 1 refills | Status: DC

## 2018-02-14 NOTE — Telephone Encounter (Signed)
[facility] nurse called and reported the patient is home. She visited the patient 02/11/2018 and she had slight wheezing in her lungs.  She states today the wheezing has worsened and the patient is coughing up yellow sputum.  The patient is using her nebulizer every 4 hours and the Performist twice a day. Dr Melford Aase sent RX's for a Z-pak and Prednisone 20 mg to  Her pharmacy.

## 2018-02-14 NOTE — Telephone Encounter (Signed)
This encounter was created in error - please disregard.

## 2018-02-15 ENCOUNTER — Other Ambulatory Visit: Payer: Self-pay | Admitting: *Deleted

## 2018-02-15 DIAGNOSIS — F0280 Dementia in other diseases classified elsewhere without behavioral disturbance: Secondary | ICD-10-CM | POA: Diagnosis not present

## 2018-02-15 DIAGNOSIS — J9621 Acute and chronic respiratory failure with hypoxia: Secondary | ICD-10-CM | POA: Diagnosis not present

## 2018-02-15 DIAGNOSIS — G20 Parkinson's disease: Secondary | ICD-10-CM | POA: Diagnosis not present

## 2018-02-15 DIAGNOSIS — J441 Chronic obstructive pulmonary disease with (acute) exacerbation: Secondary | ICD-10-CM | POA: Diagnosis not present

## 2018-02-15 DIAGNOSIS — I110 Hypertensive heart disease with heart failure: Secondary | ICD-10-CM | POA: Diagnosis not present

## 2018-02-15 DIAGNOSIS — I5032 Chronic diastolic (congestive) heart failure: Secondary | ICD-10-CM | POA: Diagnosis not present

## 2018-02-15 NOTE — Patient Outreach (Signed)
[facility]) Care Management  02/15/2018  Inge Waldroup Neubecker 1936/02/25 [id number]   Transition of care from snf week 2  [facility] CM unable to leave HIPPA compliant voice message for Mr& Mrs Stavely. The mobile number [phone] indicates no voice mail box has been set up x 2  [facility] CM left a HIPPA compliant voice message for Mr & Mrs Lecuyer at the home number [phone] including [facility] CM mobile number for a return call.     Plans [facility] CM will return a call in 3-4 business days Pended call  Unsuccessful outreach letter sent  Joelene Millin L. Lavina Hamman, RN, BSN, [place] Coordinator [phone] week day mobile

## 2018-02-16 DIAGNOSIS — I5032 Chronic diastolic (congestive) heart failure: Secondary | ICD-10-CM | POA: Diagnosis not present

## 2018-02-16 DIAGNOSIS — J9621 Acute and chronic respiratory failure with hypoxia: Secondary | ICD-10-CM | POA: Diagnosis not present

## 2018-02-16 DIAGNOSIS — F0280 Dementia in other diseases classified elsewhere without behavioral disturbance: Secondary | ICD-10-CM | POA: Diagnosis not present

## 2018-02-16 DIAGNOSIS — J441 Chronic obstructive pulmonary disease with (acute) exacerbation: Secondary | ICD-10-CM | POA: Diagnosis not present

## 2018-02-16 DIAGNOSIS — G20 Parkinson's disease: Secondary | ICD-10-CM | POA: Diagnosis not present

## 2018-02-16 DIAGNOSIS — I110 Hypertensive heart disease with heart failure: Secondary | ICD-10-CM | POA: Diagnosis not present

## 2018-02-16 NOTE — Progress Notes (Signed)
Hospital follow up  Assessment and Plan: Hospital visit follow up for: acute on chronic respiratory failure, CHF Hospital discharge meds were reviewed, and reconciled with the patient.   No medications were discontinued  Chronic diastolic CHF (congestive heart failure) (HCC) Pedal edema, ?Stevie Kern today, will increase lasix for 5 days to 40 mg, reminded to increase potassium with this Continue with daily weights at home -     potassium chloride SA (K-DUR,KLOR-CON) 20 MEQ tablet; Take 1 tablet by mouth daily.  Restrictive lung disease  Chronic respiratory failure with hypoxia, on home O2 therapy ([place]) Continue with current therapy, doing well at home, not needing PRN O2 therapy Follow up with Dr. Lake Bells as scheduled - discuss possible benefit of positive airway support at home to prevent unnecessary hospitalizations -     COMPLETE METABOLIC PANEL WITH GFR -     budesonide (PULMICORT) 0.5 MG/2ML nebulizer solution; Take 2 mLs (0.5 mg total) by nebulization 2 (two) times daily. Dx-J45.909, J44.0, J20.9  Acute respiratory failure with hypoxia (HCC) Resolved - doing very well with improved medication compliance -     CBC with Differential/Platelet -     COMPLETE METABOLIC PANEL WITH GFR  Oropharyngeal dysphagia       Reminded to take small bites, chew thoroughly, avoid eating while distracted    Protein-calorie malnutrition, severe -     COMPLETE METABOLIC PANEL WITH GFR  Problem with medical care compliance       Improved with home health care, and daughter has been putting meds in pill box       Continue to monitor closely    Chronic anxiety -     ALPRAZolam (XANAX) 0.5 MG tablet; Take 1 tablet 3 x / day for Anxiety  Other orders -     sertraline (ZOLOFT) 25 MG tablet; Take 1 tablet (25 mg total) by mouth daily. -     doxycycline (VIBRAMYCIN) 100 MG capsule; Take 1 capsule 2 x/day with food for 5 days -  then 1 x/day with food for 10 days  Over 40 minutes of exam, counseling,  chart review, and complex, high/moderate level critical decision making was performed this visit.   Future Appointments  Date Time Provider [place]  02/18/2018  3:30 PM Barbaraann Faster, RN [facility] None  02/22/2018  2:00 PM Barbaraann Faster, RN [facility] None  03/02/2018 11:45 AM Juanito Doom, MD [facility] None  03/04/2018 11:30 AM Narda Amber K, DO [facility] None  05/12/2018 11:00 AM [name], MD [facility] None    HPI 82 y.o.female presents for follow up for transition from recent hospitalization and SNIF stay. Admit date to the hospital was 01/11/18, patient was discharged to [facility] on 3/26 and was discharged from the [facility] on 02/05/18 and our clinical staff contacted the patient the day after discharge to set up a follow up appointment. The discharge summary, medications, and diagnostic test results were reviewed before meeting with the patient. The patient was admitted for: COPD exacerbation and volume overload with LE edema.   She has hx of restrictive Lung Dz and Parkinson's (+) with mild Dementia and limited insight has has numerous ER visits for acute dyspnea (has had recurrent hospital admissions due to COPD exacerbation12/18,02/19,03/19) compounding her moderately severe Restrictive Lung Disease when she panics . It has been long suspected that there is a large component of poor compliance in medication regimen of her inhalent bronchodilator therapy and husband likewise seems oblivious to her med dosing  schedules and of little help in assessing her compliance. Home health with medication management have been ordered, but ?due to patient request had not been continued.   The patient presents today from home accompanied by her husband and daughter.  Patient has a history of COPD Gold stage IIwith FEV1 of 69%,on as needed oxygen, diastolic heart failure due to LVH per Echo in 06/18 (LV EF 65-70%), Parkinson disease follows with neurology, oropharyngeal  dysphagia, reflux, hypertension and anxiety. She is here today doing very well, off of O2, which she has at home to be used as needed only and reportedly hasn't needed. She is finishing up a zpak and prednisone taper after contacting the office a few days ago to report a cough. She reports symptoms are improved. Follow up with Dr. Lake Bells is scheduled for May 15.   She is concerned about two blisters with surrounding injection to right foot 3rd and 4th digits; has been applying topical abx at home.   Home health is involved, for BP/weight checks (have been stable), med management, PT, OT.   Images while in the hospital: Dg Chest 2 View  Result Date: 01/24/2018 CLINICAL DATA:  Shortness of breath for 1 day EXAM: CHEST - 2 VIEW COMPARISON:  01/07/2018 FINDINGS: Cardiac shadow is stable. Aortic calcifications are again seen. Mild interstitial changes are noted bilaterally. Some increased left retrocardiac density is noted consistent with early infiltrate. IMPRESSION: Early left retrocardiac infiltrate. Electronically Signed   By: Inez Catalina M.D.   On: 01/24/2018 16:23    Past Medical History:  Diagnosis Date  . Balance disorder 2008.     Falls a lot.  She can be standing and then leans too far over to one side   . Bulging disc   . Chronic bronchitis ([place])    due to congestion at times, on prednisone and advair  . COPD (chronic obstructive pulmonary disease) ([place])   . COPD with acute exacerbation ([place]) 12/15/2017  . Depression    many years ago  . GERD (gastroesophageal reflux disease)   . Hearing loss    mild  . Hyperlipidemia   . Hypertension   . Iatrogenic adrenal insufficiency ([place])   . Incontinence    not indicated at this visit.  Marland Kitchen LVH (left ventricular hypertrophy) due to hypertensive disease    a. 02/2017: echo showing an EF of 65-70% with moderate LVH, hyperdynamic LV with subvalvular gradient of 70 mmHg, SAM, and mild MR  . Neuropathy    legs stay numb   . Osteoarthritis     hands,   . Osteoporosis   . Shortness of breath dyspnea   . Tubulovillous adenoma of rectum   . Wears dentures   . Wears glasses      Allergies  Allergen Reactions  . Demerol  [Meperidine Hcl] Other (See Comments)    Pt. Feels like she is suffocating  . Ertapenem Hives    Caused whole body to turn red Other reaction(s): Redness Caused whole body to turn red  . Codeine Other (See Comments)    hallucinations  . Demerol Other (See Comments)    hallucinations  . Morphine And Related Other (See Comments)    hallucinations  . Other Other (See Comments)    Invan 7- pt became red all over  . Sulfate Other (See Comments)    unknown      Current Outpatient Medications on File Prior to Visit  Medication Sig Dispense Refill  . acetaminophen (TYLENOL) 325 MG tablet  Take 650 mg by mouth every 6 (six) hours as needed.    Marland Kitchen albuterol (PROVENTIL) (2.5 MG/3ML) 0.083% nebulizer solution Use 1 ampule in nebulizer 4 times a day or every 4 hours as needed to rescue asthma. 360 mL 3  . aspirin 81 MG tablet Take 81 mg by mouth daily. Buys OTC    . azithromycin (ZITHROMAX) 250 MG tablet Take 2 tablets (500 mg) on  Day 1,  followed by 1 tablet (250 mg) once daily on Days 2 through 5. 6 each 1  . Balsam [place]-Castor Oil (VENELEX) OINT Apply to sacrum and bilateral buttocks q shift for prevention and prn s/p incontinence or erythema  every shift    . carbidopa-levodopa (SINEMET IR) 25-100 MG tablet Take 1.5 tablets by mouth 2 (two) times daily. 270 tablet 3  . Cholecalciferol (VITAMIN D) 2000 units CAPS Take 2,000 capsules by mouth daily.    Marland Kitchen ENSURE (ENSURE) Take 237 mLs by mouth daily.    . famotidine (PEPCID) 20 MG tablet Take 20 mg by mouth 2 (two) times daily.     . formoterol (PERFOROMIST) 20 MCG/2ML nebulizer solution Take 2 mLs (20 mcg total) by nebulization 2 (two) times daily. 360 mL 2  . furosemide (LASIX) 20 MG tablet Take 20 mg by mouth daily.    Marland Kitchen omeprazole (PRILOSEC) 40 MG capsule Take  40 mg by mouth daily as needed (reflux,).     . polyethylene glycol (MIRALAX / GLYCOLAX) packet Take 17 g by mouth daily as needed for mild constipation.    . predniSONE (DELTASONE) 20 MG tablet 1 tab 3 x day for 3 days, then 1 tab 2 x day for 3 days, then 1 tab 1 x day for 5 days 20 tablet 0   No current facility-administered medications on file prior to visit.     ROS: all negative except above.   Physical Exam: Filed Weights   02/17/18 1010  Weight: 127 lb (57.6 kg)   BP 118/72   Pulse 62   Temp (!) 97.5 F (36.4 C)   Ht 5\' 3"  (1.6 m)   Wt 127 lb (57.6 kg)   SpO2 97%   BMI 22.50 kg/m  General Appearance: Well nourished, in no apparent distress. Eyes: PERRLA, EOMs, conjunctiva no swelling or erythema Sinuses: No Frontal/maxillary tenderness ENT/Mouth: Ext aud canals clear, TMs without erythema, bulging. No erythema, swelling, or exudate on post pharynx.  Tonsils not swollen or erythematous. Hearing normal.  Neck: Supple, thyroid normal.  Respiratory: Respiratory effort normal, BS equal bilaterally without rales, rhonchi, wheezing or stridor.  Cardio: RRR with no MRGs. Brisk peripheral pulses symmetrically, but with 2+ pitting edema to bilateral ankles and pedal area.  Abdomen: Soft, + BS.  Non tender, no guarding, rebound, hernias, masses. Lymphatics: Non tender without lymphadenopathy.  Musculoskeletal: Symmetrical strength, slow gait.  Skin: Warm, dry without rashes, ecchymosis. Some blistering and injection to right foot 3rd and 4th digits.  Psych: Awake and oriented X 3, normal affect, Insight and Judgment appropriate.     Izora Ribas, NP 11:50 AM [facility]

## 2018-02-17 ENCOUNTER — Encounter: Payer: Self-pay | Admitting: Adult Health

## 2018-02-17 ENCOUNTER — Ambulatory Visit (INDEPENDENT_AMBULATORY_CARE_PROVIDER_SITE_OTHER): Payer: Medicare Other | Admitting: Adult Health

## 2018-02-17 VITALS — BP 118/72 | HR 62 | Temp 97.5°F | Ht 63.0 in | Wt 127.0 lb

## 2018-02-17 DIAGNOSIS — Z9981 Dependence on supplemental oxygen: Secondary | ICD-10-CM

## 2018-02-17 DIAGNOSIS — F419 Anxiety disorder, unspecified: Secondary | ICD-10-CM

## 2018-02-17 DIAGNOSIS — J9601 Acute respiratory failure with hypoxia: Secondary | ICD-10-CM

## 2018-02-17 DIAGNOSIS — G20 Parkinson's disease: Secondary | ICD-10-CM | POA: Diagnosis not present

## 2018-02-17 DIAGNOSIS — J984 Other disorders of lung: Secondary | ICD-10-CM

## 2018-02-17 DIAGNOSIS — I5032 Chronic diastolic (congestive) heart failure: Secondary | ICD-10-CM | POA: Diagnosis not present

## 2018-02-17 DIAGNOSIS — Z9119 Patient's noncompliance with other medical treatment and regimen: Secondary | ICD-10-CM

## 2018-02-17 DIAGNOSIS — F0280 Dementia in other diseases classified elsewhere without behavioral disturbance: Secondary | ICD-10-CM | POA: Diagnosis not present

## 2018-02-17 DIAGNOSIS — J441 Chronic obstructive pulmonary disease with (acute) exacerbation: Secondary | ICD-10-CM | POA: Diagnosis not present

## 2018-02-17 DIAGNOSIS — R1312 Dysphagia, oropharyngeal phase: Secondary | ICD-10-CM | POA: Diagnosis not present

## 2018-02-17 DIAGNOSIS — J9611 Chronic respiratory failure with hypoxia: Secondary | ICD-10-CM | POA: Diagnosis not present

## 2018-02-17 DIAGNOSIS — E43 Unspecified severe protein-calorie malnutrition: Secondary | ICD-10-CM | POA: Diagnosis not present

## 2018-02-17 DIAGNOSIS — Z91199 Patient's noncompliance with other medical treatment and regimen due to unspecified reason: Secondary | ICD-10-CM

## 2018-02-17 DIAGNOSIS — J9621 Acute and chronic respiratory failure with hypoxia: Secondary | ICD-10-CM | POA: Diagnosis not present

## 2018-02-17 DIAGNOSIS — I110 Hypertensive heart disease with heart failure: Secondary | ICD-10-CM | POA: Diagnosis not present

## 2018-02-17 LAB — COMPLETE METABOLIC PANEL WITH GFR
AG RATIO: 1.7 (calc) (ref 1.0–2.5)
ALT: 6 U/L (ref 6–29)
AST: 14 U/L (ref 10–35)
Albumin: 4 g/dL (ref 3.6–5.1)
Alkaline phosphatase (APISO): 55 U/L (ref 33–130)
BILIRUBIN TOTAL: 0.6 mg/dL (ref 0.2–1.2)
BUN: 24 mg/dL (ref 7–25)
CHLORIDE: 103 mmol/L (ref 98–110)
CO2: 31 mmol/L (ref 20–32)
Calcium: 9.1 mg/dL (ref 8.6–10.4)
Creat: 0.91 mg/dL (ref 0.60–0.93)
GFR, Est African American: 70 mL/min/{1.73_m2} (ref >=60)
GFR, Est Non African American: 60 mL/min/{1.73_m2} (ref >=60)
Globulin: 2.3 g/dL (calc) (ref 1.9–3.7)
Glucose, Bld: 87 mg/dL (ref 65–99)
POTASSIUM: 4 mmol/L (ref 3.5–5.3)
Sodium: 142 mmol/L (ref 135–146)
Total Protein: 6.3 g/dL (ref 6.1–8.1)

## 2018-02-17 LAB — CBC WITH DIFFERENTIAL/PLATELET
Basophils Absolute: 18 cells/uL (ref 0–200)
Basophils Relative: 0.2 %
EOS ABS: 9 {cells}/uL — AB (ref 15–500)
Eosinophils Relative: 0.1 %
HCT: 40.4 % (ref 35.0–45.0)
Hemoglobin: 13.5 g/dL (ref 11.7–15.5)
Lymphs Abs: 1135 cells/uL (ref 850–3900)
MCH: 28.7 pg (ref 27.0–33.0)
MCHC: 33.4 g/dL (ref 32.0–36.0)
MCV: 85.8 fL (ref 80.0–100.0)
MPV: 10.6 fL (ref 7.5–12.5)
Monocytes Relative: 7 %
NEUTROS PCT: 79.8 %
Neutro Abs: 7022 cells/uL (ref 1500–7800)
PLATELETS: 324 10*3/uL (ref 140–400)
RBC: 4.71 10*6/uL (ref 3.80–5.10)
RDW: 13.4 % (ref 11.0–15.0)
TOTAL LYMPHOCYTE: 12.9 %
WBC: 8.8 10*3/uL (ref 3.8–10.8)
WBCMIX: 616 {cells}/uL (ref 200–950)

## 2018-02-17 MED ORDER — DOXYCYCLINE HYCLATE 100 MG PO CAPS: ORAL_CAPSULE | ORAL | 0 refills | Status: DC

## 2018-02-17 MED ORDER — BUDESONIDE 0.5 MG/2ML IN SUSP: 0.5000 mg | Freq: Two times a day (BID) | RESPIRATORY_TRACT | 1 refills | Status: DC

## 2018-02-17 MED ORDER — POTASSIUM CHLORIDE CRYS ER 20 MEQ PO TBCR: EXTENDED_RELEASE_TABLET | ORAL | 1 refills | Status: DC

## 2018-02-17 MED ORDER — ALPRAZOLAM 0.5 MG PO TABS: ORAL_TABLET | ORAL | 2 refills | Status: DC

## 2018-02-17 MED ORDER — SERTRALINE HCL 25 MG PO TABS: 25.0000 mg | ORAL_TABLET | Freq: Every day | ORAL | 1 refills | Status: DC

## 2018-02-17 NOTE — Patient Instructions (Signed)
Increase lasix to two tablets (40 mg) daily for 5 days - increase potassium to two tabs while increasing lasix  Continue to put your feet up  I am sending in doxycycline for your skin infection as well as to give continued lung coverage    Edema Edema is when you have too much fluid in your body or under your skin. Edema may make your legs, feet, and ankles swell up. Swelling is also common in looser tissues, like around your eyes. This is a common condition. It gets more common as you get older. There are many possible causes of edema. Eating too much salt (sodium) and being on your feet or sitting for a long time can cause edema in your legs, feet, and ankles. Hot weather may make edema worse. Edema is usually painless. Your skin may look swollen or shiny. Follow these instructions at home:  Keep the swollen body part raised (elevated) above the level of your heart when you are sitting or lying down.  Do not sit still or stand for a long time.  Do not wear tight clothes. Do not wear garters on your upper legs.  Exercise your legs. This can help the swelling go down.  Wear elastic bandages or support stockings as told by your doctor.  Eat a low-salt (low-sodium) diet to reduce fluid as told by your doctor.  Depending on the cause of your swelling, you may need to limit how much fluid you drink (fluid restriction).  Take over-the-counter and prescription medicines only as told by your doctor. Contact a doctor if:  Treatment is not working.  You have heart, liver, or kidney disease and have symptoms of edema.  You have sudden and unexplained weight gain. Get help right away if:  You have shortness of breath or chest pain.  You cannot breathe when you lie down.  You have pain, redness, or warmth in the swollen areas.  You have heart, liver, or kidney disease and get edema all of a sudden.  You have a fever and your symptoms get worse all of a sudden. Summary  Edema is  when you have too much fluid in your body or under your skin.  Edema may make your legs, feet, and ankles swell up. Swelling is also common in looser tissues, like around your eyes.  Raise (elevate) the swollen body part above the level of your heart when you are sitting or lying down.  Follow your doctor's instructions about diet and how much fluid you can drink (fluid restriction). This information is not intended to replace advice given to you by your health care provider. Make sure you discuss any questions you have with your health care provider. Document Released: 03/23/2008 Document Revised: 10/23/2016 Document Reviewed: 10/23/2016 Elsevier Interactive Patient Education  2017 [employer].

## 2018-02-18 ENCOUNTER — Other Ambulatory Visit: Payer: Self-pay | Admitting: *Deleted

## 2018-02-18 DIAGNOSIS — J441 Chronic obstructive pulmonary disease with (acute) exacerbation: Secondary | ICD-10-CM | POA: Diagnosis not present

## 2018-02-18 DIAGNOSIS — G20 Parkinson's disease: Secondary | ICD-10-CM | POA: Diagnosis not present

## 2018-02-18 DIAGNOSIS — I110 Hypertensive heart disease with heart failure: Secondary | ICD-10-CM | POA: Diagnosis not present

## 2018-02-18 DIAGNOSIS — F0280 Dementia in other diseases classified elsewhere without behavioral disturbance: Secondary | ICD-10-CM | POA: Diagnosis not present

## 2018-02-18 DIAGNOSIS — J9621 Acute and chronic respiratory failure with hypoxia: Secondary | ICD-10-CM | POA: Diagnosis not present

## 2018-02-18 DIAGNOSIS — I5032 Chronic diastolic (congestive) heart failure: Secondary | ICD-10-CM | POA: Diagnosis not present

## 2018-02-21 DIAGNOSIS — F0280 Dementia in other diseases classified elsewhere without behavioral disturbance: Secondary | ICD-10-CM | POA: Diagnosis not present

## 2018-02-21 DIAGNOSIS — J441 Chronic obstructive pulmonary disease with (acute) exacerbation: Secondary | ICD-10-CM | POA: Diagnosis not present

## 2018-02-21 DIAGNOSIS — J9621 Acute and chronic respiratory failure with hypoxia: Secondary | ICD-10-CM | POA: Diagnosis not present

## 2018-02-21 DIAGNOSIS — I5032 Chronic diastolic (congestive) heart failure: Secondary | ICD-10-CM | POA: Diagnosis not present

## 2018-02-21 DIAGNOSIS — I110 Hypertensive heart disease with heart failure: Secondary | ICD-10-CM | POA: Diagnosis not present

## 2018-02-21 DIAGNOSIS — G20 Parkinson's disease: Secondary | ICD-10-CM | POA: Diagnosis not present

## 2018-02-22 ENCOUNTER — Ambulatory Visit: Payer: Self-pay | Admitting: *Deleted

## 2018-02-22 DIAGNOSIS — I5032 Chronic diastolic (congestive) heart failure: Secondary | ICD-10-CM | POA: Diagnosis not present

## 2018-02-22 DIAGNOSIS — G20 Parkinson's disease: Secondary | ICD-10-CM | POA: Diagnosis not present

## 2018-02-22 DIAGNOSIS — J441 Chronic obstructive pulmonary disease with (acute) exacerbation: Secondary | ICD-10-CM | POA: Diagnosis not present

## 2018-02-22 DIAGNOSIS — F0280 Dementia in other diseases classified elsewhere without behavioral disturbance: Secondary | ICD-10-CM | POA: Diagnosis not present

## 2018-02-22 DIAGNOSIS — I110 Hypertensive heart disease with heart failure: Secondary | ICD-10-CM | POA: Diagnosis not present

## 2018-02-22 DIAGNOSIS — J9621 Acute and chronic respiratory failure with hypoxia: Secondary | ICD-10-CM | POA: Diagnosis not present

## 2018-02-24 DIAGNOSIS — J441 Chronic obstructive pulmonary disease with (acute) exacerbation: Secondary | ICD-10-CM | POA: Diagnosis not present

## 2018-02-24 DIAGNOSIS — I5032 Chronic diastolic (congestive) heart failure: Secondary | ICD-10-CM | POA: Diagnosis not present

## 2018-02-24 DIAGNOSIS — I110 Hypertensive heart disease with heart failure: Secondary | ICD-10-CM | POA: Diagnosis not present

## 2018-02-24 DIAGNOSIS — J9621 Acute and chronic respiratory failure with hypoxia: Secondary | ICD-10-CM | POA: Diagnosis not present

## 2018-02-24 DIAGNOSIS — F0280 Dementia in other diseases classified elsewhere without behavioral disturbance: Secondary | ICD-10-CM | POA: Diagnosis not present

## 2018-02-24 DIAGNOSIS — G20 Parkinson's disease: Secondary | ICD-10-CM | POA: Diagnosis not present

## 2018-02-25 DIAGNOSIS — F0280 Dementia in other diseases classified elsewhere without behavioral disturbance: Secondary | ICD-10-CM | POA: Diagnosis not present

## 2018-02-25 DIAGNOSIS — J9621 Acute and chronic respiratory failure with hypoxia: Secondary | ICD-10-CM | POA: Diagnosis not present

## 2018-02-25 DIAGNOSIS — I110 Hypertensive heart disease with heart failure: Secondary | ICD-10-CM | POA: Diagnosis not present

## 2018-02-25 DIAGNOSIS — J441 Chronic obstructive pulmonary disease with (acute) exacerbation: Secondary | ICD-10-CM | POA: Diagnosis not present

## 2018-02-25 DIAGNOSIS — G20 Parkinson's disease: Secondary | ICD-10-CM | POA: Diagnosis not present

## 2018-02-25 DIAGNOSIS — I5032 Chronic diastolic (congestive) heart failure: Secondary | ICD-10-CM | POA: Diagnosis not present

## 2018-02-28 DIAGNOSIS — G20 Parkinson's disease: Secondary | ICD-10-CM | POA: Diagnosis not present

## 2018-02-28 DIAGNOSIS — I5032 Chronic diastolic (congestive) heart failure: Secondary | ICD-10-CM | POA: Diagnosis not present

## 2018-02-28 DIAGNOSIS — J441 Chronic obstructive pulmonary disease with (acute) exacerbation: Secondary | ICD-10-CM | POA: Diagnosis not present

## 2018-02-28 DIAGNOSIS — I110 Hypertensive heart disease with heart failure: Secondary | ICD-10-CM | POA: Diagnosis not present

## 2018-02-28 DIAGNOSIS — F0280 Dementia in other diseases classified elsewhere without behavioral disturbance: Secondary | ICD-10-CM | POA: Diagnosis not present

## 2018-02-28 DIAGNOSIS — J9621 Acute and chronic respiratory failure with hypoxia: Secondary | ICD-10-CM | POA: Diagnosis not present

## 2018-03-02 ENCOUNTER — Ambulatory Visit (INDEPENDENT_AMBULATORY_CARE_PROVIDER_SITE_OTHER): Payer: Medicare Other | Admitting: Pulmonary Disease

## 2018-03-02 ENCOUNTER — Encounter: Payer: Self-pay | Admitting: Pulmonary Disease

## 2018-03-02 VITALS — BP 118/70 | HR 81 | Ht 64.0 in | Wt 126.8 lb

## 2018-03-02 DIAGNOSIS — G232 Striatonigral degeneration: Secondary | ICD-10-CM

## 2018-03-02 DIAGNOSIS — Z9181 History of falling: Secondary | ICD-10-CM

## 2018-03-02 DIAGNOSIS — I5032 Chronic diastolic (congestive) heart failure: Secondary | ICD-10-CM

## 2018-03-02 DIAGNOSIS — J984 Other disorders of lung: Secondary | ICD-10-CM

## 2018-03-02 DIAGNOSIS — F0280 Dementia in other diseases classified elsewhere without behavioral disturbance: Secondary | ICD-10-CM | POA: Diagnosis not present

## 2018-03-02 DIAGNOSIS — J441 Chronic obstructive pulmonary disease with (acute) exacerbation: Secondary | ICD-10-CM | POA: Diagnosis not present

## 2018-03-02 DIAGNOSIS — J9621 Acute and chronic respiratory failure with hypoxia: Secondary | ICD-10-CM | POA: Diagnosis not present

## 2018-03-02 DIAGNOSIS — G20C Parkinsonism, unspecified: Secondary | ICD-10-CM

## 2018-03-02 DIAGNOSIS — G20 Parkinson's disease: Secondary | ICD-10-CM | POA: Diagnosis not present

## 2018-03-02 DIAGNOSIS — R1312 Dysphagia, oropharyngeal phase: Secondary | ICD-10-CM | POA: Diagnosis not present

## 2018-03-02 DIAGNOSIS — I110 Hypertensive heart disease with heart failure: Secondary | ICD-10-CM | POA: Diagnosis not present

## 2018-03-02 NOTE — Assessment & Plan Note (Signed)
Continue follow-up with cardiology Adhere to a diet of less than 2000 mg of sodium a day Contact cardiology if edema and lower extremity swelling persists or does not improve on current Lasix regimen

## 2018-03-02 NOTE — Assessment & Plan Note (Signed)
Currently stable and well managed on treatment regimen Continue nebulizing treatments as ordered Continue inhaler regimen No refills needed today We will bring back in 4 months for pulmonary function test and an follow-up appointment with Dr. Lake Bells Discussed the importance of close follow-up if respiratory changes occur, patient and husband agree and acknowledge that they understand

## 2018-03-02 NOTE — Assessment & Plan Note (Addendum)
Husband and patient report no recent falls Discussed the importance of fall management as well as follow-up of fall occurs Continue close follow-up with neurology and primary care Continue use of Rollator and assistive devices

## 2018-03-02 NOTE — Progress Notes (Signed)
Reviewed, agree 

## 2018-03-02 NOTE — Patient Instructions (Addendum)
Pulmonary Function Test at next appointment  Follow up in 4 months with [facility]   Continue close follow up with Neurology, cardiology, and Primary Care  Continue current inhaler regimen  Continue Nebulizer treatments as prescribed  Monitory for lower extremity swelling, if any swelling is present or weight is increasing please contact cardiology   Please contact the office if your symptoms worsen or you have concerns that you are not improving.   Thank you for choosing [facility] for your healthcare, and for allowing [place] to partner with you on your healthcare journey. I am thankful to be able to provide care to you today.   Wyn Quaker FNP-C

## 2018-03-02 NOTE — Assessment & Plan Note (Signed)
Continue follow-up with neurology Notify primary care neurology if falls occur Pulmonary function test in September

## 2018-03-02 NOTE — Assessment & Plan Note (Signed)
No changes Continue same diet If swallowing issues occur or persist please follow-up with primary care

## 2018-03-02 NOTE — Progress Notes (Signed)
@Patient  ID: Marie [name], female    DOB: 1936-09-28, 82 y.o.   MRN: [id number]  Chief Complaint  Patient presents with  . Follow-up    reports she has been good, no concerns.     Referring provider: [name], MD  HPI: 82 year old female never smoker followed for restrictive lung disease and asthma.  Past medical history significant for Parkinson's plus syndrome.    Recent office visits:  01/17/2018 office visit [facility] Discharge: was recently hospitalized last week for COPD exacerbation.  Felt to possibly have underlying asthma along with restrictive lung disease due to her muscle weakness with Parkinson's plus syndrome along with dysphasia.  Was also seen by speech pathology at a rehab center status post discharge felt to have underlying dysphasia and recommended on dysphagia 3 diet with thin liquids.  Patient reports today she is been feeling better tapering off her prednisone.  As of this appointment patient currently remains in rehab and hopes to finish soon to go home.  Has had patient has had 4 hospital admissions over the last 6 months for COPD exacerbation.  07/12/2017-office visit-[facility] Patient reports that been feeling well.  Had does have some trouble swallowing.  No recent episodes of bronchitis or pneumonia.  Follow-up in 1 year.  Continue follow-up with neurology.  Tests:  Lung function testing: February 2018 for pulmonary function tests: Ratio 83%, FEV1 1.24 L 65% predicted, FVC 1.50 L 58% predicted, some change in small airways with bronchodilator, total lung capacity 3.31 L 67% predicted, ERV 13% predicted, DLCO 10.5 946% predicted September 2018 ratio normal, FVC 2.01 L 79% predicted, total lung capacity 2.89 L 59% predicted, DLCO 18.78 81% predicted Since today after recently completing doxycycline and prednisone due to recent flare.  Imaging: CT chest in 2018 images personally reviewed showing no evidence of a pulmonary parenchymal abnormality  with the exception of what appears to be mucus plugging in a central left lower lobe airway, but no other surrounding atelectasis or mass.  Other testing: Barium swallow 11/2016 IMPRESSION:1. Markedly delayed oral transit. 2. Premature spill with thin liquid and flash penetration with cough. No aspiration.  03/02/18  Office Visit  Returns for follow-up appointment today.  Patient reports being discharged from [facility] on 02/05/18.  Reporting compliance to inhalers, feeling well since being discharged from skilled nursing facility and rehab post hospital admission.  No recent hospital admissions since 01/11/18.  Husband present with show for appointment.  Husband and patient report that breathing has been doing well, with no recent exacerbations or issues.  Also reporting no recent falls.  Reporting no issues as far as with swallowing and following diet.   Allergies  Allergen Reactions  . Demerol  [Meperidine Hcl] Other (See Comments)    Pt. Feels like she is suffocating  . Ertapenem Hives    Caused whole body to turn red Other reaction(s): Redness Caused whole body to turn red  . Codeine Other (See Comments)    hallucinations  . Demerol Other (See Comments)    hallucinations  . Morphine And Related Other (See Comments)    hallucinations  . Other Other (See Comments)    Invan 7- pt became red all over  . Sulfate Other (See Comments)    unknown    Immunization History  Administered Date(s) Administered  . Influenza, High Dose Seasonal PF 08/23/2014, 07/24/2015, 08/13/2016, 07/12/2017  . Pneumococcal Conjugate-13 08/23/2014  . Pneumococcal-Unspecified 07/20/2003, 08/17/2013  . Td 08/17/2013  . Tdap  10/25/2012  . Zoster 03/19/2015    Past Medical History:  Diagnosis Date  . Balance disorder 2008.     Falls a lot.  She can be standing and then leans too far over to one side   . Bulging disc   . Chronic bronchitis ([place])    due to congestion at times, on prednisone  and advair  . COPD (chronic obstructive pulmonary disease) ([place])   . COPD with acute exacerbation ([place]) 12/15/2017  . Depression    many years ago  . GERD (gastroesophageal reflux disease)   . Hearing loss    mild  . Hyperlipidemia   . Hypertension   . Iatrogenic adrenal insufficiency ([place])   . Incontinence    not indicated at this visit.  Marland Kitchen LVH (left ventricular hypertrophy) due to hypertensive disease    a. 02/2017: echo showing an EF of 65-70% with moderate LVH, hyperdynamic LV with subvalvular gradient of 70 mmHg, SAM, and mild MR  . Neuropathy    legs stay numb   . Osteoarthritis    hands,   . Osteoporosis   . Shortness of breath dyspnea   . Tubulovillous adenoma of rectum   . Wears dentures   . Wears glasses     Tobacco History: Social History   Tobacco Use  Smoking Status Never Smoker  Smokeless Tobacco Never Used   Counseling given: Not Answered   Outpatient Encounter Medications as of 03/02/2018  Medication Sig  . acetaminophen (TYLENOL) 325 MG tablet Take 650 mg by mouth every 6 (six) hours as needed.  Marland Kitchen albuterol (PROVENTIL) (2.5 MG/3ML) 0.083% nebulizer solution Use 1 ampule in nebulizer 4 times a day or every 4 hours as needed to rescue asthma.  . ALPRAZolam (XANAX) 0.5 MG tablet Take 1 tablet 3 x / day for Anxiety  . aspirin 81 MG tablet Take 81 mg by mouth daily. Buys OTC  . azithromycin (ZITHROMAX) 250 MG tablet Take 2 tablets (500 mg) on  Day 1,  followed by 1 tablet (250 mg) once daily on Days 2 through 5.  . Balsam [place]-Castor Oil (VENELEX) OINT Apply to sacrum and bilateral buttocks q shift for prevention and prn s/p incontinence or erythema  every shift  . budesonide (PULMICORT) 0.5 MG/2ML nebulizer solution Take 2 mLs (0.5 mg total) by nebulization 2 (two) times daily. Dx-J45.909, J44.0, J20.9  . carbidopa-levodopa (SINEMET IR) 25-100 MG tablet Take 1.5 tablets by mouth 2 (two) times daily.  . Cholecalciferol (VITAMIN D) 2000 units CAPS Take 2,000  capsules by mouth daily.  Marland Kitchen doxycycline (VIBRAMYCIN) 100 MG capsule Take 1 capsule 2 x/day with food for 5 days -  then 1 x/day with food for 10 days  . ENSURE (ENSURE) Take 237 mLs by mouth daily.  . famotidine (PEPCID) 20 MG tablet Take 20 mg by mouth 2 (two) times daily.   . formoterol (PERFOROMIST) 20 MCG/2ML nebulizer solution Take 2 mLs (20 mcg total) by nebulization 2 (two) times daily.  . furosemide (LASIX) 20 MG tablet Take 20 mg by mouth daily.  Marland Kitchen omeprazole (PRILOSEC) 40 MG capsule Take 40 mg by mouth daily as needed (reflux,).   . polyethylene glycol (MIRALAX / GLYCOLAX) packet Take 17 g by mouth daily as needed for mild constipation.  . potassium chloride SA (K-DUR,KLOR-CON) 20 MEQ tablet Take 1 tablet by mouth daily.  . predniSONE (DELTASONE) 20 MG tablet 1 tab 3 x day for 3 days, then 1 tab 2 x day for 3 days, then  1 tab 1 x day for 5 days  . sertraline (ZOLOFT) 25 MG tablet Take 1 tablet (25 mg total) by mouth daily.   No facility-administered encounter medications on file as of 03/02/2018.      Review of Systems  Constitutional:   No  weight loss, night sweats,  Fevers, chills, fatigue, or  lassitude.  HEENT:   No headaches,  Difficulty swallowing,  Tooth/dental problems, or  Sore throat, No sneezing, itching, ear ache, nasal congestion, post nasal drip,   CV:  +occasional lower extremity edema - improved today, No chest pain,  Orthopnea, PND, dizziness, palpitations, syncope.   GI  No heartburn, indigestion, abdominal pain, nausea, vomiting, diarrhea, change in bowel habits, loss of appetite, bloody stools.   Resp: No shortness of breath with exertion or at rest.  No excess mucus, no productive cough,  No non-productive cough,  No coughing up of blood.  No change in color of mucus.  No wheezing.  No chest wall deformity  Skin: no rash or lesions.  GU: no dysuria, change in color of urine, no urgency or frequency.  No flank pain, no hematuria   MS:  No joint pain or  swelling.  No decreased range of motion.  No back pain.    Physical Exam  BP 118/70   Pulse 81   Ht 5\' 4"  (1.626 m)   Wt 126 lb 12.8 oz (57.5 kg)   SpO2 96%   BMI 21.77 kg/m   GEN: A/Ox3; pleasant , NAD, well nourished    HEENT:  [place]/AT,  EACs-clear, TMs-wnl, NOSE-clear, THROAT-clear, no lesions, no postnasal drip or exudate noted.   NECK:  Supple w/ fair ROM; no JVD; normal carotid impulses w/o bruits; no thyromegaly or nodules palpated; no lymphadenopathy.    RESP  Clear  P & A; w/o, wheezes/ rales/ or rhonchi. no accessory muscle use, no dullness to percussion  CARD:  RRR, no m/r/g, no peripheral edema, pulses intact, no cyanosis or clubbing.  GI:   Soft & nt; nml bowel sounds; no organomegaly or masses detected.   Musco: Warm bil, no deformities or joint swelling noted.   Neuro: alert, no focal deficits noted.    Skin: Warm, no lesions or rashes    Lab Results:  CBC    Component Value Date/Time   WBC 8.8 02/17/2018 1054   RBC 4.71 02/17/2018 1054   HGB 13.5 02/17/2018 1054   HCT 40.4 02/17/2018 1054   PLT 324 02/17/2018 1054   MCV 85.8 02/17/2018 1054   MCH 28.7 02/17/2018 1054   MCHC 33.4 02/17/2018 1054   RDW 13.4 02/17/2018 1054   LYMPHSABS 1,135 02/17/2018 1054   MONOABS 0.6 01/24/2018 1000   EOSABS 9 (L) 02/17/2018 1054   BASOSABS 18 02/17/2018 1054    BMET    Component Value Date/Time   NA 142 02/17/2018 1054   K 4.0 02/17/2018 1054   CL 103 02/17/2018 1054   CO2 31 02/17/2018 1054   GLUCOSE 87 02/17/2018 1054   BUN 24 02/17/2018 1054   CREATININE 0.91 02/17/2018 1054   CALCIUM 9.1 02/17/2018 1054   GFRNONAA 60 02/17/2018 1054   GFRAA 70 02/17/2018 1054    BNP    Component Value Date/Time   BNP 205.0 (H) 01/07/2018 1722   BNP 493 (H) 11/11/2017 1628    ProBNP    Component Value Date/Time   PROBNP 101.9 12/05/2013 1154    Imaging: No results found.   Assessment & Plan:  Parkinson's plus syndrome [facility]) Continue follow-up  with neurology Notify primary care neurology if falls occur Pulmonary function test in September  At high risk for falls Husband and patient report no recent falls Discussed the importance of fall management as well as follow-up of fall occurs Continue close follow-up with neurology and primary care Continue use of Rollator and assistive devices   Chronic diastolic CHF (congestive heart failure) (HCC) Continue follow-up with cardiology Adhere to a diet of less than 2000 mg of sodium a day Contact cardiology if edema and lower extremity swelling persists or does not improve on current Lasix regimen    Restrictive lung disease Currently stable and well managed on treatment regimen Continue nebulizing treatments as ordered Continue inhaler regimen No refills needed today We will bring back in 4 months for pulmonary function test and an follow-up appointment with Dr. Lake Bells Discussed the importance of close follow-up if respiratory changes occur, patient and husband agree and acknowledge that they understand  Dysphagia No changes Continue same diet If swallowing issues occur or persist please follow-up with primary care     Lauraine Rinne, NP 03/02/2018

## 2018-03-03 DIAGNOSIS — J441 Chronic obstructive pulmonary disease with (acute) exacerbation: Secondary | ICD-10-CM | POA: Diagnosis not present

## 2018-03-03 DIAGNOSIS — I5032 Chronic diastolic (congestive) heart failure: Secondary | ICD-10-CM | POA: Diagnosis not present

## 2018-03-03 DIAGNOSIS — G20 Parkinson's disease: Secondary | ICD-10-CM | POA: Diagnosis not present

## 2018-03-03 DIAGNOSIS — I110 Hypertensive heart disease with heart failure: Secondary | ICD-10-CM | POA: Diagnosis not present

## 2018-03-03 DIAGNOSIS — J9621 Acute and chronic respiratory failure with hypoxia: Secondary | ICD-10-CM | POA: Diagnosis not present

## 2018-03-03 DIAGNOSIS — F0280 Dementia in other diseases classified elsewhere without behavioral disturbance: Secondary | ICD-10-CM | POA: Diagnosis not present

## 2018-03-04 ENCOUNTER — Encounter: Payer: Self-pay | Admitting: Neurology

## 2018-03-04 ENCOUNTER — Ambulatory Visit (INDEPENDENT_AMBULATORY_CARE_PROVIDER_SITE_OTHER): Payer: Medicare Other | Admitting: Neurology

## 2018-03-04 VITALS — BP 110/70 | HR 70 | Ht 64.0 in | Wt 130.0 lb

## 2018-03-04 DIAGNOSIS — M21372 Foot drop, left foot: Secondary | ICD-10-CM

## 2018-03-04 DIAGNOSIS — G232 Striatonigral degeneration: Secondary | ICD-10-CM

## 2018-03-04 DIAGNOSIS — R471 Dysarthria and anarthria: Secondary | ICD-10-CM

## 2018-03-04 MED ORDER — CARBIDOPA-LEVODOPA 25-100 MG PO TABS: ORAL_TABLET | ORAL | 3 refills | Status: DC

## 2018-03-04 NOTE — Patient Instructions (Addendum)
Increase sinemet to 2 tablets at 9am and 3pm.  For your left foot drop, we will send you to [facility] for ankle brace  Return to clinic in 4 months

## 2018-03-04 NOTE — Progress Notes (Signed)
Follow-up Visit   Date: 03/04/18    KYLEA BERRONG MRN: [id number] DOB: 09/24/1936   Interim History: Kadejah Sandiford Sciara is a 82 y.o. 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  The patient was accompanied to the clinic by husband who also provides collateral information.    History of present illness: Starting in around the fall 2017, she began having difficulty swallowing, which is worse with liquids.  She had difficulty initiating swallowing and had MBS which showed markedly delayed oral transit.  There has been 30lb unintentional weight loss. She also complains of difficulty with speech production, but husband has not noticed slurred speech. She does not feel that symptoms are progressing, but she is concerned that they are not improving. She denies double vision, droopy eyelids, or weakness of the arms and legs.  She is short of breath, especially when eating. She sleeps on a wedge for the past few years because of acid reflux.  She has been using a walker during the same time because of imbalance.  She denies numbness/burning/tingling of the legs.  She saw Dr Jannifer Franklin in 2009 at [facility], but does not recall the reason for her visit.  For her wheezing, has been evaluated by Dr. Lake Bells whose PFTs were suggestive of restrictive pattern and therefore referred to Dr. Redmond Baseman, ENT, to evaluate for upper airway obstruction, which was not present on his exam.  She also has echocardiogram which was notable for grade 1 diastolic dysfunction.  She has been treated for COPD and URI with several courses of antibiotics and prednisone.  She denies noticing any benefit with respect to her speech or swallow function while being on prednisone.   In May 2018, she underwent NCS/EMG which showed no evidence of a neuromuscular junction disorder, myopathy, or disorder of  anterior horn cells on EMG. She has neuropathy and mild bulbar neurogenic changes. Labs including screening for myopathy, vitamin B12 deficiency, and myasthenia gravis also returned normal.  Exam showed new left hand tremor which raised my concern for parkinson-plus syndrome, such as PSP, and was started on sinemet which helped her movement.    UPDATE 03/04/2018:  She is here for follow-up visit with daughter and husband. Family has noticed new chin tremor and ongoing left hand tremor at rest.  Neither tremor prohibits patient from her usual activities.  She is able to bathe, dress, and feed herself.  She always uses a rollator to walk and daughter has noticed that her gait, especially left leg seems to be shuffling. No interval falls.  She had two hospitalizations for COPD exacerbation and pneurmonia.  Weight is doing much better, she has gained 15lb+.  No new problems with swallowing.   Medications:  Current Outpatient Medications on File Prior to Visit  Medication Sig Dispense Refill  . acetaminophen (TYLENOL) 325 MG tablet Take 650 mg by mouth every 6 (six) hours as needed.    Marland Kitchen albuterol (PROVENTIL) (2.5 MG/3ML) 0.083% nebulizer solution Use 1 ampule in nebulizer 4 times a day or every 4 hours as needed to rescue asthma. 360 mL 3  . ALPRAZolam (XANAX) 0.5 MG tablet Take 1 tablet 3 x / day for Anxiety 90 tablet 2  . aspirin 81 MG tablet Take 81 mg by mouth daily. Buys OTC    . Balsam [place]-Castor Oil (VENELEX) OINT Apply to sacrum and bilateral buttocks q shift for prevention and prn s/p incontinence  or erythema  every shift    . budesonide (PULMICORT) 0.5 MG/2ML nebulizer solution Take 2 mLs (0.5 mg total) by nebulization 2 (two) times daily. Dx-J45.909, J44.0, J20.9 180 mL 1  . Cholecalciferol (VITAMIN D) 2000 units CAPS Take 2,000 capsules by mouth daily.    Marland Kitchen ENSURE (ENSURE) Take 237 mLs by mouth daily.    . famotidine (PEPCID) 20 MG tablet Take 20 mg by mouth 2 (two) times daily.     .  formoterol (PERFOROMIST) 20 MCG/2ML nebulizer solution Take 2 mLs (20 mcg total) by nebulization 2 (two) times daily. 360 mL 2  . furosemide (LASIX) 20 MG tablet Take 20 mg by mouth daily.    Marland Kitchen losartan-hydrochlorothiazide (HYZAAR) 100-25 MG tablet Take by mouth.    Marland Kitchen omeprazole (PRILOSEC) 40 MG capsule Take 40 mg by mouth daily as needed (reflux,).     . polyethylene glycol (MIRALAX / GLYCOLAX) packet Take 17 g by mouth daily as needed for mild constipation.    . potassium chloride SA (K-DUR,KLOR-CON) 20 MEQ tablet Take 1 tablet by mouth daily. 90 tablet 1  . predniSONE (DELTASONE) 20 MG tablet 1 tab 3 x day for 3 days, then 1 tab 2 x day for 3 days, then 1 tab 1 x day for 5 days (Patient taking differently: Take 10 mg by mouth daily with breakfast. 1 tab 3 x day for 3 days, then 1 tab 2 x day for 3 days, then 1 tab 1 x day for 5 days) 20 tablet 0  . sertraline (ZOLOFT) 25 MG tablet Take 1 tablet (25 mg total) by mouth daily. 90 tablet 1   No current facility-administered medications on file prior to visit.     Allergies:  Allergies  Allergen Reactions  . Demerol  [Meperidine Hcl] Other (See Comments)    Pt. Feels like she is suffocating  . Ertapenem Hives    Caused whole body to turn red Other reaction(s): Redness Caused whole body to turn red  . Codeine Other (See Comments)    hallucinations  . Demerol Other (See Comments)    hallucinations  . Morphine And Related Other (See Comments)    hallucinations  . Other Other (See Comments)    Invan 7- pt became red all over  . Sulfate Other (See Comments)    unknown    Review of Systems:  CONSTITUTIONAL: No fevers, chills, night sweats, or weight loss.  EYES: No visual changes or eye pain ENT: No hearing changes.  No history of nose bleeds.   RESPIRATORY: +cough, +wheezing and shortness of breath.   CARDIOVASCULAR: Negative for chest pain, and palpitations.   GI: Negative for abdominal discomfort, blood in stools or black stools.   No recent change in bowel habits.   GU:  No history of incontinence.   MUSCLOSKELETAL: +history of joint pain or swelling.  No myalgias.   SKIN: Negative for lesions, rash, and itching.   ENDOCRINE: Negative for cold or heat intolerance, polydipsia or goiter.   PSYCH:  No depression or anxiety symptoms.   NEURO: As Above.   Vital Signs:  BP 110/70   Pulse 70   Ht 5\' 4"  (1.626 m)   Wt 130 lb (59 kg)   SpO2 90%   BMI 22.31 kg/m   General:  Thin-appearing, comfortable. Startled facial expression.  Neurological Exam: MENTAL STATUS including orientation to time, place, person, recent and remote memory, attention span and concentration, language, and fund of knowledge is normal.  Speech is  slow, mild dysarthria (stable).  CRANIAL NERVES:  Pupils equal round and reactive to light.  Restricted upgaze bilaterally, otherwise normal conjugate, extra-ocular eye movements.  No ptosis. Face is symmetric. Facial muscles are 5/5.  Resting chin tremor of moderate frequency and amplitude.  MOTOR:  Motor strength is 5/5 in all extremities, except interosseus muscles of the hands which is 4+/5 and left dorsiflexion, eversion, and inversion (4+/5) which is new.  There is generalized loss of muscle bulk with atrophy of the intrinsic hand muscles. Resting hand tremor on the left is present. Tone is 0+ bilateral upper extremities.    COORDINATION/GAIT:  Normal finger-to- nose-finger.  Severe bradykinesia with reduced amplitude and rate of finger and toe tapping on the left >. right.  Gait is stooped, ataxic, dragging of the left foot, assisted with a walker.   Data: Labs 01/01/2017:  CK 36, AChR antibody negative, aldolase 6.3, vitamin B12 503  NCS/EMG of the right side 03/02/2017: 1. There is evidence of a length dependent sensorimotor polyneuropathy, axon loss in type, affecting the upper and lower extremities; moderate in degree electrically. 2. Chronic motor axon loss changes seen in the bulbar muscles.  In isolation, these findings are of unclear clinical significance. Correlate clinically. 3. There is no evidence of a neuromuscular junction disorder, widespread disorder of anterior horn cells, or cervical/lumbosacral radiculopathy affecting the right side.   IMPRESSION/PLAN: 1.  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  Symptoms manifesting with dysarthria, dysphasia, and exam showing asymmetrical left-sided tremor, bradykinesia, and gait unsteadiness.  Today, there is a new chin tremor and left foot drop.   Recommend increasing sinemet 25/100 to 2 tablet twice daily (9am and 3pm).  She prefers to avoid TID dosing.   2.  Left foot weakness, ?L5 radiculopathy vs progression of neuropathy.  She denies low back pain and is not interested in surgery, even if foot drop was stemming from her back.  We discussed getting MRI lumbar spine, but since it would not change management, decided to hold off.  Recommend PT for foot drop.  She will be referred to [facility] to be fit for custom left AFO.  3.  Possibly due to cervical canal stenosis (generalized hyperreflexia). She denies any arm weakness or paresthesias.  Proceed with imaging only if she has new arm symptoms.  4  Idiopathic peripheral neuropathy, with numbness and gait ataxia.  Continue to use rollator.  Fall precautions discussed.  Return to clinic in 4 months  Greater than 50% of this 30 minute visit was spent in counseling, explanation of diagnosis, planning of further management, and coordination of care.   Thank you for allowing me to participate in patient's care.  If I can answer any additional questions, I would be pleased to do so.    Sincerely,    Aliea Bobe K. Posey Pronto, DO

## 2018-03-04 NOTE — Patient Outreach (Signed)
[facility]) Care Management  02/18/2018   Marie [name] Marie [name] May 16, 1936 [id number]   Transition of care week 3  [facility] Cm spoke with Marie [name] for further transition of care services assessment.  He reports Marie [name] continues to improve without concerns today for medications, DME, appointments or further educational needs  Plan [facility] CM will follow up with Marie [name] for continued Transition of care services in 7-10 business days [facility] CM Care Plan Problem One     Most Recent Value  Care Plan Problem One  RIsk for hospital re admission related to recent 4 re admissions (COPD, CHF)  Role Documenting the Problem One  Care Management Coordinator  Care Plan for Problem One  Active  [facility] Long Term Goal   over the next 31 days patient will not experience hospital re admission, as evidence by patient/family reporting and review of EPIC record  [facility] Long Term Goal Start Date  02/08/18  [facility] CM Short Term Goal #1   over the next 14 days patient will have resolving chf s/s or report to MD as recommended as reported by patient/family during contact/visit  [facility] CM Short Term Goal #1 Start Date  02/08/18  [facility] CM Short Term Goal #1 Met Date  02/18/18      Joelene Millin L. Lavina Hamman, RN, BSN, CCM [facility] Management Care Coordinator Direct number [phone]  Main [facility] number [phone] Fax number [phone]

## 2018-03-07 ENCOUNTER — Telehealth: Payer: Self-pay | Admitting: *Deleted

## 2018-03-07 DIAGNOSIS — I5032 Chronic diastolic (congestive) heart failure: Secondary | ICD-10-CM | POA: Diagnosis not present

## 2018-03-07 DIAGNOSIS — F0280 Dementia in other diseases classified elsewhere without behavioral disturbance: Secondary | ICD-10-CM | POA: Diagnosis not present

## 2018-03-07 DIAGNOSIS — J441 Chronic obstructive pulmonary disease with (acute) exacerbation: Secondary | ICD-10-CM | POA: Diagnosis not present

## 2018-03-07 DIAGNOSIS — J9621 Acute and chronic respiratory failure with hypoxia: Secondary | ICD-10-CM | POA: Diagnosis not present

## 2018-03-07 DIAGNOSIS — G20 Parkinson's disease: Secondary | ICD-10-CM | POA: Diagnosis not present

## 2018-03-07 DIAGNOSIS — I110 Hypertensive heart disease with heart failure: Secondary | ICD-10-CM | POA: Diagnosis not present

## 2018-03-07 NOTE — Telephone Encounter (Signed)
Per Dr Melford Aase, it is OK for the patient to receive OT 1 time a week for 1 week, 2 times a week for 2 weeks and 1 time a week for 1 week. Sharyn Lull, with [facility], is aware.

## 2018-03-08 DIAGNOSIS — F0280 Dementia in other diseases classified elsewhere without behavioral disturbance: Secondary | ICD-10-CM | POA: Diagnosis not present

## 2018-03-08 DIAGNOSIS — G20 Parkinson's disease: Secondary | ICD-10-CM | POA: Diagnosis not present

## 2018-03-08 DIAGNOSIS — I5032 Chronic diastolic (congestive) heart failure: Secondary | ICD-10-CM | POA: Diagnosis not present

## 2018-03-08 DIAGNOSIS — J441 Chronic obstructive pulmonary disease with (acute) exacerbation: Secondary | ICD-10-CM | POA: Diagnosis not present

## 2018-03-08 DIAGNOSIS — I110 Hypertensive heart disease with heart failure: Secondary | ICD-10-CM | POA: Diagnosis not present

## 2018-03-08 DIAGNOSIS — J9621 Acute and chronic respiratory failure with hypoxia: Secondary | ICD-10-CM | POA: Diagnosis not present

## 2018-03-10 ENCOUNTER — Encounter: Payer: Self-pay | Admitting: Internal Medicine

## 2018-03-10 ENCOUNTER — Other Ambulatory Visit: Payer: Self-pay | Admitting: *Deleted

## 2018-03-10 NOTE — Patient Outreach (Signed)
[place]) Care Management  03/10/2018  Marie [name] [place] Mar 26, 1936 [id number]   Transition of care week 4 case closure   [facility] Cm received a missed call from Marie [name]'s daughter. Cm returned the call to answer questions for her She reports Marie [name] is improving  [facility] CM spoke with Marie [name] and he confirms Marie [name] is improving He again this week denies concerns with medications, DME, appointments, and knowledge.  CHF/COPD CM reviewed and assessed for yellow zone symptoms. None noted.   CM discussed case closure for no further identified needs.  Marie [name] agreed upon case closure and CM offered assist in the future if needed   Plan case closure goal met for community care management Letters to be sent to patient and MD  [facility] CM Care Plan Problem One     Most Recent Value  Care Plan Problem One  RIsk for hospital re admission related to recent 4 re admissions (COPD, CHF)  Role Documenting the Problem One  Care Management Coordinator  Care Plan for Problem One  Active  [facility] Long Term Goal   over the next 31 days patient will not experience hospital re admission, as evidence by patient/family reporting and review of EPIC record  [facility] Long Term Goal Start Date  02/08/18  [facility] Long Term Goal Met Date  02/22/18  [facility] CM Short Term Goal #1   over the next 14 days patient will have resolving chf s/s or report to MD as recommended as reported by patient/family during contact/visit  [facility] CM Short Term Goal #1 Start Date  02/08/18  [facility] CM Short Term Goal #1 Met Date  02/18/18       Joelene Millin L. Lavina Hamman, RN, BSN, [place] Coordinator Direct number [phone]  Main [facility] number [phone] Fax number [phone]

## 2018-03-11 DIAGNOSIS — I110 Hypertensive heart disease with heart failure: Secondary | ICD-10-CM | POA: Diagnosis not present

## 2018-03-11 DIAGNOSIS — I5032 Chronic diastolic (congestive) heart failure: Secondary | ICD-10-CM | POA: Diagnosis not present

## 2018-03-11 DIAGNOSIS — G20 Parkinson's disease: Secondary | ICD-10-CM | POA: Diagnosis not present

## 2018-03-11 DIAGNOSIS — J9621 Acute and chronic respiratory failure with hypoxia: Secondary | ICD-10-CM | POA: Diagnosis not present

## 2018-03-11 DIAGNOSIS — J441 Chronic obstructive pulmonary disease with (acute) exacerbation: Secondary | ICD-10-CM | POA: Diagnosis not present

## 2018-03-11 DIAGNOSIS — F0280 Dementia in other diseases classified elsewhere without behavioral disturbance: Secondary | ICD-10-CM | POA: Diagnosis not present

## 2018-03-15 DIAGNOSIS — F0280 Dementia in other diseases classified elsewhere without behavioral disturbance: Secondary | ICD-10-CM | POA: Diagnosis not present

## 2018-03-15 DIAGNOSIS — J9621 Acute and chronic respiratory failure with hypoxia: Secondary | ICD-10-CM | POA: Diagnosis not present

## 2018-03-15 DIAGNOSIS — J441 Chronic obstructive pulmonary disease with (acute) exacerbation: Secondary | ICD-10-CM | POA: Diagnosis not present

## 2018-03-15 DIAGNOSIS — G20 Parkinson's disease: Secondary | ICD-10-CM | POA: Diagnosis not present

## 2018-03-15 DIAGNOSIS — I5032 Chronic diastolic (congestive) heart failure: Secondary | ICD-10-CM | POA: Diagnosis not present

## 2018-03-15 DIAGNOSIS — I110 Hypertensive heart disease with heart failure: Secondary | ICD-10-CM | POA: Diagnosis not present

## 2018-03-17 DIAGNOSIS — F0280 Dementia in other diseases classified elsewhere without behavioral disturbance: Secondary | ICD-10-CM | POA: Diagnosis not present

## 2018-03-17 DIAGNOSIS — I5032 Chronic diastolic (congestive) heart failure: Secondary | ICD-10-CM | POA: Diagnosis not present

## 2018-03-17 DIAGNOSIS — I110 Hypertensive heart disease with heart failure: Secondary | ICD-10-CM | POA: Diagnosis not present

## 2018-03-17 DIAGNOSIS — J441 Chronic obstructive pulmonary disease with (acute) exacerbation: Secondary | ICD-10-CM | POA: Diagnosis not present

## 2018-03-17 DIAGNOSIS — J9621 Acute and chronic respiratory failure with hypoxia: Secondary | ICD-10-CM | POA: Diagnosis not present

## 2018-03-17 DIAGNOSIS — G20 Parkinson's disease: Secondary | ICD-10-CM | POA: Diagnosis not present

## 2018-03-21 ENCOUNTER — Other Ambulatory Visit: Payer: Self-pay | Admitting: *Deleted

## 2018-03-21 DIAGNOSIS — H02102 Unspecified ectropion of right lower eyelid: Secondary | ICD-10-CM | POA: Diagnosis not present

## 2018-03-21 MED ORDER — ALBUTEROL SULFATE (2.5 MG/3ML) 0.083% IN NEBU: INHALATION_SOLUTION | RESPIRATORY_TRACT | 3 refills | Status: DC

## 2018-03-22 DIAGNOSIS — I5032 Chronic diastolic (congestive) heart failure: Secondary | ICD-10-CM | POA: Diagnosis not present

## 2018-03-22 DIAGNOSIS — I110 Hypertensive heart disease with heart failure: Secondary | ICD-10-CM | POA: Diagnosis not present

## 2018-03-22 DIAGNOSIS — F0280 Dementia in other diseases classified elsewhere without behavioral disturbance: Secondary | ICD-10-CM | POA: Diagnosis not present

## 2018-03-22 DIAGNOSIS — J441 Chronic obstructive pulmonary disease with (acute) exacerbation: Secondary | ICD-10-CM | POA: Diagnosis not present

## 2018-03-22 DIAGNOSIS — G20 Parkinson's disease: Secondary | ICD-10-CM | POA: Diagnosis not present

## 2018-03-22 DIAGNOSIS — J9621 Acute and chronic respiratory failure with hypoxia: Secondary | ICD-10-CM | POA: Diagnosis not present

## 2018-03-23 DIAGNOSIS — J9621 Acute and chronic respiratory failure with hypoxia: Secondary | ICD-10-CM | POA: Diagnosis not present

## 2018-03-23 DIAGNOSIS — J441 Chronic obstructive pulmonary disease with (acute) exacerbation: Secondary | ICD-10-CM | POA: Diagnosis not present

## 2018-03-23 DIAGNOSIS — I5032 Chronic diastolic (congestive) heart failure: Secondary | ICD-10-CM | POA: Diagnosis not present

## 2018-03-23 DIAGNOSIS — I110 Hypertensive heart disease with heart failure: Secondary | ICD-10-CM | POA: Diagnosis not present

## 2018-03-23 DIAGNOSIS — G20 Parkinson's disease: Secondary | ICD-10-CM | POA: Diagnosis not present

## 2018-03-23 DIAGNOSIS — F0280 Dementia in other diseases classified elsewhere without behavioral disturbance: Secondary | ICD-10-CM | POA: Diagnosis not present

## 2018-03-24 DIAGNOSIS — J9621 Acute and chronic respiratory failure with hypoxia: Secondary | ICD-10-CM | POA: Diagnosis not present

## 2018-03-24 DIAGNOSIS — I110 Hypertensive heart disease with heart failure: Secondary | ICD-10-CM | POA: Diagnosis not present

## 2018-03-24 DIAGNOSIS — G20 Parkinson's disease: Secondary | ICD-10-CM | POA: Diagnosis not present

## 2018-03-24 DIAGNOSIS — J441 Chronic obstructive pulmonary disease with (acute) exacerbation: Secondary | ICD-10-CM | POA: Diagnosis not present

## 2018-03-24 DIAGNOSIS — I5032 Chronic diastolic (congestive) heart failure: Secondary | ICD-10-CM | POA: Diagnosis not present

## 2018-03-24 DIAGNOSIS — F0280 Dementia in other diseases classified elsewhere without behavioral disturbance: Secondary | ICD-10-CM | POA: Diagnosis not present

## 2018-03-28 DIAGNOSIS — I5032 Chronic diastolic (congestive) heart failure: Secondary | ICD-10-CM | POA: Diagnosis not present

## 2018-03-28 DIAGNOSIS — J441 Chronic obstructive pulmonary disease with (acute) exacerbation: Secondary | ICD-10-CM | POA: Diagnosis not present

## 2018-03-28 DIAGNOSIS — I110 Hypertensive heart disease with heart failure: Secondary | ICD-10-CM | POA: Diagnosis not present

## 2018-03-28 DIAGNOSIS — J9621 Acute and chronic respiratory failure with hypoxia: Secondary | ICD-10-CM | POA: Diagnosis not present

## 2018-03-28 DIAGNOSIS — F0280 Dementia in other diseases classified elsewhere without behavioral disturbance: Secondary | ICD-10-CM | POA: Diagnosis not present

## 2018-03-28 DIAGNOSIS — G20 Parkinson's disease: Secondary | ICD-10-CM | POA: Diagnosis not present

## 2018-03-28 DIAGNOSIS — H02102 Unspecified ectropion of right lower eyelid: Secondary | ICD-10-CM | POA: Diagnosis not present

## 2018-03-29 DIAGNOSIS — J441 Chronic obstructive pulmonary disease with (acute) exacerbation: Secondary | ICD-10-CM | POA: Diagnosis not present

## 2018-03-29 DIAGNOSIS — I5032 Chronic diastolic (congestive) heart failure: Secondary | ICD-10-CM | POA: Diagnosis not present

## 2018-03-29 DIAGNOSIS — G20 Parkinson's disease: Secondary | ICD-10-CM | POA: Diagnosis not present

## 2018-03-29 DIAGNOSIS — F0280 Dementia in other diseases classified elsewhere without behavioral disturbance: Secondary | ICD-10-CM | POA: Diagnosis not present

## 2018-03-29 DIAGNOSIS — I110 Hypertensive heart disease with heart failure: Secondary | ICD-10-CM | POA: Diagnosis not present

## 2018-03-29 DIAGNOSIS — J9621 Acute and chronic respiratory failure with hypoxia: Secondary | ICD-10-CM | POA: Diagnosis not present

## 2018-03-31 DIAGNOSIS — J441 Chronic obstructive pulmonary disease with (acute) exacerbation: Secondary | ICD-10-CM | POA: Diagnosis not present

## 2018-03-31 DIAGNOSIS — F0280 Dementia in other diseases classified elsewhere without behavioral disturbance: Secondary | ICD-10-CM | POA: Diagnosis not present

## 2018-03-31 DIAGNOSIS — I5032 Chronic diastolic (congestive) heart failure: Secondary | ICD-10-CM | POA: Diagnosis not present

## 2018-03-31 DIAGNOSIS — G20 Parkinson's disease: Secondary | ICD-10-CM | POA: Diagnosis not present

## 2018-03-31 DIAGNOSIS — I110 Hypertensive heart disease with heart failure: Secondary | ICD-10-CM | POA: Diagnosis not present

## 2018-03-31 DIAGNOSIS — J9621 Acute and chronic respiratory failure with hypoxia: Secondary | ICD-10-CM | POA: Diagnosis not present

## 2018-04-03 ENCOUNTER — Other Ambulatory Visit: Payer: Self-pay

## 2018-04-03 ENCOUNTER — Encounter (HOSPITAL_COMMUNITY): Payer: Self-pay

## 2018-04-03 ENCOUNTER — Emergency Department (HOSPITAL_COMMUNITY): Payer: Medicare Other

## 2018-04-03 ENCOUNTER — Inpatient Hospital Stay (HOSPITAL_COMMUNITY)
Admission: EM | Admit: 2018-04-03 | Discharge: 2018-04-04 | DRG: 190 | Disposition: A | Discharge status: Home Health | Payer: Medicare Other | Attending: Internal Medicine | Admitting: Internal Medicine

## 2018-04-03 DIAGNOSIS — Z9981 Dependence on supplemental oxygen: Secondary | ICD-10-CM

## 2018-04-03 DIAGNOSIS — J9621 Acute and chronic respiratory failure with hypoxia: Secondary | ICD-10-CM | POA: Diagnosis present

## 2018-04-03 DIAGNOSIS — G232 Striatonigral degeneration: Secondary | ICD-10-CM

## 2018-04-03 DIAGNOSIS — Z79899 Other long term (current) drug therapy: Secondary | ICD-10-CM

## 2018-04-03 DIAGNOSIS — F325 Major depressive disorder, single episode, in full remission: Secondary | ICD-10-CM | POA: Diagnosis not present

## 2018-04-03 DIAGNOSIS — J9611 Chronic respiratory failure with hypoxia: Secondary | ICD-10-CM | POA: Diagnosis not present

## 2018-04-03 DIAGNOSIS — I10 Essential (primary) hypertension: Secondary | ICD-10-CM | POA: Diagnosis not present

## 2018-04-03 DIAGNOSIS — Z825 Family history of asthma and other chronic lower respiratory diseases: Secondary | ICD-10-CM

## 2018-04-03 DIAGNOSIS — M810 Age-related osteoporosis without current pathological fracture: Secondary | ICD-10-CM | POA: Diagnosis present

## 2018-04-03 DIAGNOSIS — F419 Anxiety disorder, unspecified: Secondary | ICD-10-CM | POA: Diagnosis present

## 2018-04-03 DIAGNOSIS — Z888 Allergy status to other drugs, medicaments and biological substances status: Secondary | ICD-10-CM

## 2018-04-03 DIAGNOSIS — G20 Parkinson's disease: Secondary | ICD-10-CM | POA: Diagnosis present

## 2018-04-03 DIAGNOSIS — Z885 Allergy status to narcotic agent status: Secondary | ICD-10-CM | POA: Diagnosis not present

## 2018-04-03 DIAGNOSIS — I5032 Chronic diastolic (congestive) heart failure: Secondary | ICD-10-CM | POA: Diagnosis not present

## 2018-04-03 DIAGNOSIS — R062 Wheezing: Secondary | ICD-10-CM | POA: Diagnosis not present

## 2018-04-03 DIAGNOSIS — I110 Hypertensive heart disease with heart failure: Secondary | ICD-10-CM | POA: Diagnosis present

## 2018-04-03 DIAGNOSIS — Z7982 Long term (current) use of aspirin: Secondary | ICD-10-CM

## 2018-04-03 DIAGNOSIS — K219 Gastro-esophageal reflux disease without esophagitis: Secondary | ICD-10-CM | POA: Diagnosis present

## 2018-04-03 DIAGNOSIS — J441 Chronic obstructive pulmonary disease with (acute) exacerbation: Secondary | ICD-10-CM

## 2018-04-03 DIAGNOSIS — R0602 Shortness of breath: Secondary | ICD-10-CM | POA: Diagnosis not present

## 2018-04-03 DIAGNOSIS — Z7951 Long term (current) use of inhaled steroids: Secondary | ICD-10-CM | POA: Diagnosis not present

## 2018-04-03 DIAGNOSIS — R0682 Tachypnea, not elsewhere classified: Secondary | ICD-10-CM | POA: Diagnosis not present

## 2018-04-03 LAB — BASIC METABOLIC PANEL
Anion gap: 11 (ref 5–15)
BUN: 19 mg/dL (ref 6–20)
CO2: 30 mmol/L (ref 22–32)
Calcium: 9.3 mg/dL (ref 8.9–10.3)
Chloride: 103 mmol/L (ref 101–111)
Creatinine, Ser: 0.91 mg/dL (ref 0.44–1.00)
GFR calc Af Amer: >60 mL/min (ref >60)
GFR, EST NON AFRICAN AMERICAN: 58 mL/min — AB (ref >60)
GLUCOSE: 127 mg/dL — AB (ref 65–99)
Potassium: 3.8 mmol/L (ref 3.5–5.1)
Sodium: 144 mmol/L (ref 135–145)

## 2018-04-03 LAB — CBC WITH DIFFERENTIAL/PLATELET
BASOS ABS: 0.1 10*3/uL (ref 0.0–0.1)
BASOS PCT: 1 %
EOS PCT: 9 %
Eosinophils Absolute: 1.3 10*3/uL — ABNORMAL HIGH (ref 0.0–0.7)
HCT: 50.3 % — ABNORMAL HIGH (ref 36.0–46.0)
Hemoglobin: 15.8 g/dL — ABNORMAL HIGH (ref 12.0–15.0)
Lymphocytes Relative: 32 %
Lymphs Abs: 4.5 10*3/uL — ABNORMAL HIGH (ref 0.7–4.0)
MCH: 28.8 pg (ref 26.0–34.0)
MCHC: 31.4 g/dL (ref 30.0–36.0)
MCV: 91.8 fL (ref 78.0–100.0)
MONO ABS: 1.1 10*3/uL — AB (ref 0.1–1.0)
MONOS PCT: 8 %
Neutro Abs: 7.2 10*3/uL (ref 1.7–7.7)
Neutrophils Relative %: 50 %
PLATELETS: 286 10*3/uL (ref 150–400)
RBC: 5.48 MIL/uL — ABNORMAL HIGH (ref 3.87–5.11)
RDW: 14.1 % (ref 11.5–15.5)
WBC: 14.2 10*3/uL — ABNORMAL HIGH (ref 4.0–10.5)

## 2018-04-03 LAB — GLUCOSE, CAPILLARY: GLUCOSE-CAPILLARY: 193 mg/dL — AB (ref 65–99)

## 2018-04-03 LAB — MRSA PCR SCREENING: MRSA by PCR: POSITIVE — AB

## 2018-04-03 MED ORDER — PANTOPRAZOLE SODIUM 40 MG PO TBEC
40.0000 mg | DELAYED_RELEASE_TABLET | Freq: Every day | ORAL | Status: DC
  Administered 2018-04-03 – 2018-04-04 (×2): 40 mg via ORAL
  Filled 2018-04-03 (×2): qty 1

## 2018-04-03 MED ORDER — METHYLPREDNISOLONE SODIUM SUCC 125 MG IJ SOLR
60.0000 mg | Freq: Four times a day (QID) | INTRAMUSCULAR | Status: DC
  Administered 2018-04-03 – 2018-04-04 (×6): 60 mg via INTRAVENOUS
  Filled 2018-04-03 (×6): qty 2

## 2018-04-03 MED ORDER — ALPRAZOLAM 0.5 MG PO TABS
0.5000 mg | ORAL_TABLET | Freq: Three times a day (TID) | ORAL | Status: DC
  Administered 2018-04-03 – 2018-04-04 (×4): 0.5 mg via ORAL
  Filled 2018-04-03 (×4): qty 1

## 2018-04-03 MED ORDER — SERTRALINE HCL 50 MG PO TABS
25.0000 mg | ORAL_TABLET | Freq: Every day | ORAL | Status: DC
  Administered 2018-04-03 – 2018-04-04 (×2): 25 mg via ORAL
  Filled 2018-04-03 (×2): qty 1

## 2018-04-03 MED ORDER — ALBUTEROL SULFATE (2.5 MG/3ML) 0.083% IN NEBU
INHALATION_SOLUTION | RESPIRATORY_TRACT | Status: AC
  Administered 2018-04-03: 2.5 mg
  Filled 2018-04-03: qty 3

## 2018-04-03 MED ORDER — IPRATROPIUM-ALBUTEROL 0.5-2.5 (3) MG/3ML IN SOLN
3.0000 mL | Freq: Once | RESPIRATORY_TRACT | Status: AC
  Administered 2018-04-03: 3 mL via RESPIRATORY_TRACT
  Filled 2018-04-03: qty 3

## 2018-04-03 MED ORDER — ONDANSETRON HCL 4 MG/2ML IJ SOLN: 4.0000 mg | Freq: Four times a day (QID) | INTRAMUSCULAR | Status: DC | PRN

## 2018-04-03 MED ORDER — ACETAMINOPHEN 650 MG RE SUPP: 650.0000 mg | Freq: Four times a day (QID) | RECTAL | Status: DC | PRN

## 2018-04-03 MED ORDER — ALBUTEROL SULFATE (2.5 MG/3ML) 0.083% IN NEBU
INHALATION_SOLUTION | RESPIRATORY_TRACT | Status: AC
  Administered 2018-04-03: 5 mg
  Filled 2018-04-03: qty 6

## 2018-04-03 MED ORDER — CHLORHEXIDINE GLUCONATE CLOTH 2 % EX PADS
6.0000 | MEDICATED_PAD | Freq: Every day | CUTANEOUS | Status: DC
  Administered 2018-04-04: 6 via TOPICAL

## 2018-04-03 MED ORDER — SODIUM CHLORIDE 0.9 % IV SOLN
INTRAVENOUS | Status: AC
  Filled 2018-04-03: qty 500

## 2018-04-03 MED ORDER — LOSARTAN POTASSIUM 25 MG PO TABS
100.0000 mg | ORAL_TABLET | Freq: Every day | ORAL | Status: DC
  Administered 2018-04-03 – 2018-04-04 (×2): 100 mg via ORAL
  Filled 2018-04-03 (×2): qty 4

## 2018-04-03 MED ORDER — SODIUM CHLORIDE 0.9% FLUSH: 3.0000 mL | INTRAVENOUS | Status: DC | PRN

## 2018-04-03 MED ORDER — ONDANSETRON HCL 4 MG PO TABS: 4.0000 mg | ORAL_TABLET | Freq: Four times a day (QID) | ORAL | Status: DC | PRN

## 2018-04-03 MED ORDER — MUPIROCIN 2 % EX OINT
1.0000 "application " | TOPICAL_OINTMENT | Freq: Two times a day (BID) | CUTANEOUS | Status: DC
  Administered 2018-04-03 – 2018-04-04 (×2): 1 via NASAL
  Filled 2018-04-03: qty 22

## 2018-04-03 MED ORDER — BUDESONIDE 0.5 MG/2ML IN SUSP
0.5000 mg | Freq: Two times a day (BID) | RESPIRATORY_TRACT | Status: DC
  Administered 2018-04-03 – 2018-04-04 (×3): 0.5 mg via RESPIRATORY_TRACT
  Filled 2018-04-03 (×3): qty 2

## 2018-04-03 MED ORDER — SODIUM CHLORIDE 0.9 % IV SOLN
500.0000 mg | INTRAVENOUS | Status: DC
  Administered 2018-04-03 – 2018-04-04 (×2): 500 mg via INTRAVENOUS
  Filled 2018-04-03 (×3): qty 500

## 2018-04-03 MED ORDER — ASPIRIN EC 81 MG PO TBEC
81.0000 mg | DELAYED_RELEASE_TABLET | Freq: Every day | ORAL | Status: DC
  Administered 2018-04-03 – 2018-04-04 (×2): 81 mg via ORAL
  Filled 2018-04-03 (×2): qty 1

## 2018-04-03 MED ORDER — POTASSIUM CHLORIDE CRYS ER 20 MEQ PO TBCR
20.0000 meq | EXTENDED_RELEASE_TABLET | Freq: Every day | ORAL | Status: DC
  Administered 2018-04-03 – 2018-04-04 (×2): 20 meq via ORAL
  Filled 2018-04-03: qty 2
  Filled 2018-04-03: qty 1

## 2018-04-03 MED ORDER — SODIUM CHLORIDE 0.9% FLUSH
3.0000 mL | Freq: Two times a day (BID) | INTRAVENOUS | Status: DC
  Administered 2018-04-03 – 2018-04-04 (×4): 3 mL via INTRAVENOUS

## 2018-04-03 MED ORDER — IBUPROFEN 400 MG PO TABS: 400.0000 mg | ORAL_TABLET | Freq: Four times a day (QID) | ORAL | Status: DC | PRN

## 2018-04-03 MED ORDER — IPRATROPIUM-ALBUTEROL 0.5-2.5 (3) MG/3ML IN SOLN
RESPIRATORY_TRACT | Status: AC
  Administered 2018-04-03: 3 mL
  Filled 2018-04-03: qty 3

## 2018-04-03 MED ORDER — ACETAMINOPHEN 325 MG PO TABS: 650.0000 mg | ORAL_TABLET | Freq: Four times a day (QID) | ORAL | Status: DC | PRN

## 2018-04-03 MED ORDER — ENOXAPARIN SODIUM 40 MG/0.4ML ~~LOC~~ SOLN
40.0000 mg | SUBCUTANEOUS | Status: DC
  Administered 2018-04-03 – 2018-04-04 (×2): 40 mg via SUBCUTANEOUS
  Filled 2018-04-03 (×2): qty 0.4

## 2018-04-03 MED ORDER — CARBIDOPA-LEVODOPA 25-100 MG PO TABS
2.0000 | ORAL_TABLET | ORAL | Status: DC
  Administered 2018-04-03: 2 via ORAL
  Filled 2018-04-03: qty 2

## 2018-04-03 MED ORDER — ARFORMOTEROL TARTRATE 15 MCG/2ML IN NEBU
15.0000 ug | INHALATION_SOLUTION | Freq: Two times a day (BID) | RESPIRATORY_TRACT | Status: DC
  Administered 2018-04-03 – 2018-04-04 (×3): 15 ug via RESPIRATORY_TRACT
  Filled 2018-04-03 (×3): qty 2

## 2018-04-03 MED ORDER — SODIUM CHLORIDE 0.9 % IV SOLN
250.0000 mL | INTRAVENOUS | Status: DC | PRN
  Administered 2018-04-03: 10 mL/h via INTRAVENOUS

## 2018-04-03 MED ORDER — HYDROCHLOROTHIAZIDE 25 MG PO TABS
25.0000 mg | ORAL_TABLET | Freq: Every day | ORAL | Status: DC
  Administered 2018-04-03 – 2018-04-04 (×2): 25 mg via ORAL
  Filled 2018-04-03 (×2): qty 1

## 2018-04-03 MED ORDER — FAMOTIDINE 20 MG PO TABS
20.0000 mg | ORAL_TABLET | Freq: Two times a day (BID) | ORAL | Status: DC
  Administered 2018-04-03 – 2018-04-04 (×3): 20 mg via ORAL
  Filled 2018-04-03 (×3): qty 1

## 2018-04-03 MED ORDER — POLYMYXIN B-TRIMETHOPRIM 10000-0.1 UNIT/ML-% OP SOLN
1.0000 [drp] | Freq: Four times a day (QID) | OPHTHALMIC | Status: DC
  Filled 2018-04-03: qty 10

## 2018-04-03 MED ORDER — ALBUTEROL SULFATE (2.5 MG/3ML) 0.083% IN NEBU
2.5000 mg | INHALATION_SOLUTION | RESPIRATORY_TRACT | Status: DC | PRN
  Administered 2018-04-03: 2.5 mg via RESPIRATORY_TRACT
  Filled 2018-04-03: qty 3

## 2018-04-03 MED ORDER — ORAL CARE MOUTH RINSE
15.0000 mL | Freq: Two times a day (BID) | OROMUCOSAL | Status: DC
  Administered 2018-04-03 – 2018-04-04 (×3): 15 mL via OROMUCOSAL

## 2018-04-03 MED ORDER — FUROSEMIDE 20 MG PO TABS
20.0000 mg | ORAL_TABLET | Freq: Every day | ORAL | Status: DC
  Administered 2018-04-03 – 2018-04-04 (×2): 20 mg via ORAL
  Filled 2018-04-03 (×2): qty 1

## 2018-04-03 MED ORDER — IPRATROPIUM BROMIDE 0.02 % IN SOLN
RESPIRATORY_TRACT | Status: AC
  Administered 2018-04-03: 02:00:00
  Filled 2018-04-03: qty 2.5

## 2018-04-03 MED ORDER — SODIUM CHLORIDE 0.9% FLUSH
3.0000 mL | Freq: Two times a day (BID) | INTRAVENOUS | Status: DC
  Administered 2018-04-03 – 2018-04-04 (×2): 3 mL via INTRAVENOUS

## 2018-04-03 MED ORDER — MAGNESIUM SULFATE 2 GM/50ML IV SOLN
2.0000 g | Freq: Once | INTRAVENOUS | Status: AC
  Administered 2018-04-03: 2 g via INTRAVENOUS
  Filled 2018-04-03: qty 50

## 2018-04-03 MED ORDER — VITAMIN D 1000 UNITS PO TABS
2000.0000 [IU] | ORAL_TABLET | Freq: Every day | ORAL | Status: DC
  Administered 2018-04-03 – 2018-04-04 (×2): 2000 [IU] via ORAL
  Filled 2018-04-03 (×2): qty 2

## 2018-04-03 NOTE — Progress Notes (Signed)
Patient seen and examined, database reviewed.  No family members at bedside.  Patient admitted this morning around 4 AM due to shortness of breath and thought to have COPD with acute exacerbation.  She is already significantly improved today, plan to continue steroids, ICS/LABA, albuterol nebs.  Believe she is okay to transfer to the medical floor today.  We will continue to follow.  Domingo Mend, MD [facility] Pager: [phone]

## 2018-04-03 NOTE — Progress Notes (Signed)
Patient off BiPAP for now. On 4 lpm BiPAP on stand by

## 2018-04-03 NOTE — Progress Notes (Signed)
Transported by WC from CCU.  Assisted to bed.  Alert and oriented and stated feeling better than when came in.  Vitals stable and 100% on 3 liters. [place] placed and oriented to room and call light.  Husband at bedside.

## 2018-04-03 NOTE — ED Provider Notes (Signed)
[facility] EMERGENCY DEPARTMENT Provider Note   CSN: [id number] Arrival date & time: 04/03/18  0150     History   Chief Complaint Chief Complaint  Patient presents with  . Shortness of Breath    HPI Marie [name] is a 82 y.o. female.  The history is provided by the patient and the EMS personnel.  Shortness of Breath   She has history of hypertension, hyperlipidemia, COPD and comes in with difficulty breathing which started today and got worse tonight.  She is a rather poor historian, and will not tell me if she was using her home nebulizer or not.  EMS arrived and gave her methylprednisolone and 2 albuterol nebulizer treatments.  She seemed to have poor respiratory effort in route and was placed on CPAP.  She has had a slight cough which is nonproductive.  She denies fever or chills or sweats.  She denies chest pain.  Past Medical History:  Diagnosis Date  . Balance disorder 2008.     Falls a lot.  She can be standing and then leans too far over to one side   . Bulging disc   . Chronic bronchitis ([place])    due to congestion at times, on prednisone and advair  . COPD (chronic obstructive pulmonary disease) ([place])   . COPD with acute exacerbation ([place]) 12/15/2017  . Depression    many years ago  . GERD (gastroesophageal reflux disease)   . Hearing loss    mild  . Hyperlipidemia   . Hypertension   . Iatrogenic adrenal insufficiency ([place])   . Incontinence    not indicated at this visit.  Marland Kitchen LVH (left ventricular hypertrophy) due to hypertensive disease    a. 02/2017: echo showing an EF of 65-70% with moderate LVH, hyperdynamic LV with subvalvular gradient of 70 mmHg, SAM, and mild MR  . Neuropathy    legs stay numb   . Osteoarthritis    hands,   . Osteoporosis   . Shortness of breath dyspnea   . Tubulovillous adenoma of rectum   . Wears dentures   . Wears glasses     Patient Active Problem List   Diagnosis Date Noted  . Asthmatic bronchitis 01/17/2018  . Chronic  diastolic CHF (congestive heart failure) ([place]) 12/15/2017  . Anxiety 12/15/2017  . Subvalvular aortic stenosis determined by imaging 11/15/2017  . Pedal edema 11/15/2017  . Problem with medical care compliance 11/11/2017  . Aortic atherosclerosis ([place]) 10/19/2017  . Dysphagia 09/28/2017  . Chronic respiratory failure with hypoxia, on home O2 therapy ([place]) 09/20/2017  . Hypertensive heart disease 03/29/2017  . Protein-calorie malnutrition, severe 03/17/2017  . 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 Restrictive lung disease 02/24/2017  . LVH (left ventricular hypertrophy) 01/07/2017  . Depression, major, in remission ([place]) 09/22/2016  . At high risk for falls 10/16/2015  . Prediabetes 08/23/2014  . Vitamin D deficiency 08/23/2014  . Medication management 08/23/2014  . Unstable Gait 01/30/2014  . Incontinence   . Hyperlipidemia   . GERD (gastroesophageal reflux disease)   . Hereditary and idiopathic peripheral neuropathy   . Essential hypertension   . Tubulovillous adenoma of rectum 09/04/2011    Past Surgical History:  Procedure Laterality Date  . ABDOMINAL HYSTERECTOMY    . APPENDECTOMY    . CHONDROPLASTY  09/19/2014   Procedure: CHONDROPLASTY;  Surgeon: Alta Corning, MD;  Location: [place];  Service: Orthopedics;;  . DILATION AND CURETTAGE OF UTERUS    . EXTERNAL  FIXATION LEG  10/25/2012   Procedure: EXTERNAL FIXATION LEG;  Surgeon: Rozanna Box, MD;  Location: [place];  Service: Orthopedics;  Laterality: Right;  . EYE SURGERY     bilateral cataract surgery and lens implant  . FINGER SURGERY     fusions and debridements for OA  . INCONTINENCE SURGERY     multiple procedures, not cured  . KNEE ARTHROSCOPY WITH LATERAL MENISECTOMY Right 09/19/2014   Procedure: KNEE ARTHROSCOPY WITH LATERAL MENISECTOMY;  Surgeon: Alta Corning, MD;  Location: [place];  Service: Orthopedics;  Laterality: Right;  . KNEE ARTHROSCOPY WITH MEDIAL  MENISECTOMY Right 09/19/2014   Procedure: RIGHT KNEE ARTHROSCOPY WITH MEDIAL AND LATERAL MENISECTOMIES. CHONDROPLASTY OF PATELLA-FEMORAL JOINT;  Surgeon: Alta Corning, MD;  Location: [place];  Service: Orthopedics;  Laterality: Right;  . RECTAL BIOPSY  09/21/2011   Procedure: BIOPSY RECTAL;  Surgeon: Merrie Roof, MD;  Location: [place];  Service: General;  Laterality: N/A;  3-4 cm  . RECTAL SURGERY     by dr. Marlou Starks, removal of polyp  . TONSILLECTOMY       OB History   None      Home Medications    Prior to Admission medications   Medication Sig Start Date End Date Taking? Authorizing Provider  acetaminophen (TYLENOL) 325 MG tablet Take 650 mg by mouth every 6 (six) hours as needed.    [provider]  albuterol (PROVENTIL) (2.5 MG/3ML) 0.083% nebulizer solution Use 1 ampule in nebulizer 4 times a day or every 4 hours as needed to rescue asthma. 03/21/18 03/21/19  [name], MD  ALPRAZolam Duanne Moron) 0.5 MG tablet Take 1 tablet 3 x / day for Anxiety 02/17/18   Liane Comber, NP  aspirin 81 MG tablet Take 81 mg by mouth daily. Buys OTC    [provider]  Janne Lab Oil (VENELEX) OINT Apply to sacrum and bilateral buttocks q shift for prevention and prn s/p incontinence or erythema  every shift    [provider]  budesonide (PULMICORT) 0.5 MG/2ML nebulizer solution Take 2 mLs (0.5 mg total) by nebulization 2 (two) times daily. Dx-J45.909, J44.0, J20.9 02/17/18   Liane Comber, NP  carbidopa-levodopa (SINEMET IR) 25-100 MG tablet Take 2 tablet at 9am and 2 tablets at 3pm. 03/04/18   Narda Amber K, DO  Cholecalciferol (VITAMIN D) 2000 units CAPS Take 2,000 capsules by mouth daily.    [provider]  ENSURE (ENSURE) Take 237 mLs by mouth daily.    [provider]  famotidine (PEPCID) 20 MG tablet Take 20 mg by mouth 2 (two) times daily.     [provider]  formoterol (PERFOROMIST) 20 MCG/2ML nebulizer solution  Take 2 mLs (20 mcg total) by nebulization 2 (two) times daily. 09/29/17   Liane Comber, NP  furosemide (LASIX) 20 MG tablet Take 20 mg by mouth daily.    [provider]  losartan-hydrochlorothiazide (HYZAAR) 100-25 MG tablet Take by mouth.    [provider]  omeprazole (PRILOSEC) 40 MG capsule Take 40 mg by mouth daily as needed (reflux,).     [provider]  polyethylene glycol (MIRALAX / GLYCOLAX) packet Take 17 g by mouth daily as needed for mild constipation. 01/11/18   Barton Dubois, MD  potassium chloride SA (K-DUR,KLOR-CON) 20 MEQ tablet Take 1 tablet by mouth daily. 02/17/18   Liane Comber, NP  predniSONE (DELTASONE) 20 MG tablet 1 tab 3 x day for 3 days,  then 1 tab 2 x day for 3 days, then 1 tab 1 x day for 5 days Patient taking differently: Take 10 mg by mouth daily with breakfast. 1 tab 3 x day for 3 days, then 1 tab 2 x day for 3 days, then 1 tab 1 x day for 5 days 02/14/18   [name], MD  sertraline (ZOLOFT) 25 MG tablet Take 1 tablet (25 mg total) by mouth daily. 02/17/18   Liane Comber, NP    Family History Family History  Problem Relation Age of Onset  . Cancer Sister        pt unaware of what kind  . High blood pressure Mother   . COPD Mother   . Heart attack Father     Social History Social History   Tobacco Use  . Smoking status: Never Smoker  . Smokeless tobacco: Never Used  Substance Use Topics  . Alcohol use: No  . Drug use: No     Allergies   Demerol  [meperidine hcl]; Ertapenem; Codeine; Demerol; Morphine and related; Other; and Sulfate   Review of Systems Review of Systems  Respiratory: Positive for shortness of breath.   All other systems reviewed and are negative.    Physical Exam Updated Vital Signs BP (!) 159/98   Pulse 93   SpO2 99%   Physical Exam  Nursing note and vitals reviewed.  Somewhat cachectic 82 year old female, resting comfortably and in no acute distress. Vital signs are  significant for hypertension. Oxygen saturation is 99%, which is normal. Head is normocephalic and atraumatic. PERRLA, EOMI. Oropharynx is clear.  BiPAP mask in place. Neck is nontender and supple without adenopathy or JVD. Back is nontender and there is no CVA tenderness. Lungs have diffuse inspiratory and expiratory wheezes without rales or rhonchi.  Airflow is slightly diminished throughout. Chest is nontender. Heart has regular rate and rhythm without murmur. Abdomen is soft, flat, nontender without masses or hepatosplenomegaly and peristalsis is normoactive. Extremities have no cyanosis or edema, full range of motion is present. Skin is warm and dry without rash. Neurologic: Mental status is normal, cranial nerves are intact, there are no motor or sensory deficits.  ED Treatments / Results  Labs (all labs ordered are listed, but only abnormal results are displayed) Labs Reviewed  BASIC METABOLIC PANEL - Abnormal; Notable for the following components:      Result Value   Glucose, Bld 127 (*)    GFR calc non Af Amer 58 (*)    All other components within normal limits  CBC WITH DIFFERENTIAL/PLATELET - Abnormal; Notable for the following components:   WBC 14.2 (*)    RBC 5.48 (*)    Hemoglobin 15.8 (*)    HCT 50.3 (*)    Lymphs Abs 4.5 (*)    Monocytes Absolute 1.1 (*)    Eosinophils Absolute 1.3 (*)    All other components within normal limits  CULTURE, EXPECTORATED SPUTUM-ASSESSMENT    EKG EKG Interpretation  Date/Time:  Sunday April 03 2018 02:01:05 EDT Ventricular Rate:  92 PR Interval:    QRS Duration: 91 QT Interval:  379 QTC Calculation: 469 R Axis:   -13 Text Interpretation:  Sinus rhythm Consider right atrial enlargement RSR' in V1 or V2, probably normal variant LVH with secondary repolarization abnormality Anterior Q waves, possibly due to LVH Artifact in lead(s) I II III aVR aVL aVF V1 V2 V3 V4 V5 When compared with ECG of 01/07/2018, QT has shortened Confirmed by  Delora Fuel ([id number]) on 04/03/2018 2:08:33 AM   Radiology Dg Chest Port 1 View  Result Date: 04/03/2018 CLINICAL DATA:  Shortness of breath.  Wheezing. EXAM: PORTABLE CHEST 1 VIEW COMPARISON:  01/24/2018 FINDINGS: The cardiomediastinal contours are normal. Resolved retrocardiac opacity from prior exam. Central bronchial thickening. Pulmonary vasculature is normal. No new consolidation, pleural effusion, or pneumothorax. No acute osseous abnormalities are seen. IMPRESSION: Central bronchial thickening, increased from prior. Resolved retrocardiac opacity. Electronically Signed   By: Jeb Levering M.D.   On: 04/03/2018 02:12    Procedures Procedures  CRITICAL CARE Performed by: Delora Fuel Total critical care time: 45 minutes Critical care time was exclusive of separately billable procedures and treating other patients. Critical care was necessary to treat or prevent imminent or life-threatening deterioration. Critical care was time spent personally by me on the following activities: development of treatment plan with patient and/or surrogate as well as nursing, discussions with consultants, evaluation of patient's response to treatment, examination of patient, obtaining history from patient or surrogate, ordering and performing treatments and interventions, ordering and review of laboratory studies, ordering and review of radiographic studies, pulse oximetry and re-evaluation of patient's condition.  Medications Ordered in ED Medications  ipratropium-albuterol (DUONEB) 0.5-2.5 (3) MG/3ML nebulizer solution 3 mL (has no administration in time range)  magnesium sulfate IVPB 2 g 50 mL (has no administration in time range)     Initial Impression / Assessment and Plan / ED Course  I have reviewed the triage vital signs and the nursing notes.  Pertinent labs & imaging results that were available during my care of the patient were reviewed by me and considered in my medical decision making (see  chart for details).  COPD exacerbation.  Old records are reviewed, and she has multiple ED visits and hospitalizations for COPD exacerbation.  She has already received steroids.  On arrival, she is switched from CPAP to BiPAP.  Will give additional albuterol with ipratropium and also IV magnesium.  Will check chest x-ray to rule out pneumonia.  Chest x-ray shows no evidence of pneumonia.  With additional nebulizer treatments, there was no clinical improvement.  She will need to be admitted.  Case is discussed with Dr. Myna Hidalgo of [facility], who agrees to admit the patient.  Final Clinical Impressions(s) / ED Diagnoses   Final diagnoses:  COPD exacerbation [facility])    ED Discharge Orders    None       Delora Fuel, MD 61/44/31 [phone]

## 2018-04-03 NOTE — ED Triage Notes (Signed)
Arrived via ems. Sob started at [place]. Hx of copd. Wears o2 at night. Given albuterol neb and 125 of soloumedrol by ems. cipap on arrival

## 2018-04-03 NOTE — H&P (Signed)
History and Physical    Marie [name] VQQ:[id number] DOB: 04-27-1936 DOA: 04/03/2018  PCP: [name], MD   Patient coming from: Home  Chief Complaint: SOB   HPI: Marie [name] is a 82 y.o. female with medical history significant for Parkinson's, chronic diastolic CHF, depression with anxiety, and COPD with chronic hypoxic respiratory failure, now presenting to the emergency department for evaluation of worsening shortness of breath.  Patient reports that she was in her usual state of health yesterday, but noted worsening in her chronic dyspnea and chronic cough several hours prior to arrival in the ED.  She denies recent chest pain, fevers, or chills.  She denies swelling or tenderness in the lower extremities.  EMS was called due to her worsening shortness of breath and the patient was treated with 125 mg of IV Solu-Medrol, albuterol, and CPAP prior to arrival in the ED.  ED Course: Upon arrival to the ED, patient is found to be afebrile, saturating adequately on BiPAP, and with vitals otherwise normal.  EKG features a sinus rhythm with LVH and chest x-ray is notable for increased central bronchial thickening and resolved retrocardiac opacity.  Chemistry panel is unremarkable and CBC is notable for a leukocytosis to 14,200 and mild polycythemia.  Patient was treated with duo nebs, 2 g IV magnesium, and BiPAP in the ED.  She remains hemodynamically stable and will be admitted for ongoing evaluation and management of acute exacerbation in COPD.  Review of Systems:  All other systems reviewed and apart from HPI, are negative.  Past Medical History:  Diagnosis Date  . Balance disorder 2008.     Falls a lot.  She can be standing and then leans too far over to one side   . Bulging disc   . Chronic bronchitis ([place])    due to congestion at times, on prednisone and advair  . COPD (chronic obstructive pulmonary disease) ([place])   . COPD with acute exacerbation ([place]) 12/15/2017  . Depression     many years ago  . GERD (gastroesophageal reflux disease)   . Hearing loss    mild  . Hyperlipidemia   . Hypertension   . Iatrogenic adrenal insufficiency ([place])   . Incontinence    not indicated at this visit.  Marland Kitchen LVH (left ventricular hypertrophy) due to hypertensive disease    a. 02/2017: echo showing an EF of 65-70% with moderate LVH, hyperdynamic LV with subvalvular gradient of 70 mmHg, SAM, and mild MR  . Neuropathy    legs stay numb   . Osteoarthritis    hands,   . Osteoporosis   . Shortness of breath dyspnea   . Tubulovillous adenoma of rectum   . Wears dentures   . Wears glasses     Past Surgical History:  Procedure Laterality Date  . ABDOMINAL HYSTERECTOMY    . APPENDECTOMY    . CHONDROPLASTY  09/19/2014   Procedure: CHONDROPLASTY;  Surgeon: Alta Corning, MD;  Location: [facility];  Service: Orthopedics;;  . DILATION AND CURETTAGE OF UTERUS    . EXTERNAL FIXATION LEG  10/25/2012   Procedure: EXTERNAL FIXATION LEG;  Surgeon: Rozanna Box, MD;  Location: [facility];  Service: Orthopedics;  Laterality: Right;  . EYE SURGERY     bilateral cataract surgery and lens implant  . FINGER SURGERY     fusions and debridements for OA  . INCONTINENCE SURGERY     multiple procedures, not cured  . KNEE ARTHROSCOPY WITH LATERAL MENISECTOMY  Right 09/19/2014   Procedure: KNEE ARTHROSCOPY WITH LATERAL MENISECTOMY;  Surgeon: Alta Corning, MD;  Location: [facility];  Service: Orthopedics;  Laterality: Right;  . KNEE ARTHROSCOPY WITH MEDIAL MENISECTOMY Right 09/19/2014   Procedure: RIGHT KNEE ARTHROSCOPY WITH MEDIAL AND LATERAL MENISECTOMIES. CHONDROPLASTY OF PATELLA-FEMORAL JOINT;  Surgeon: Alta Corning, MD;  Location: [facility];  Service: Orthopedics;  Laterality: Right;  . RECTAL BIOPSY  09/21/2011   Procedure: BIOPSY RECTAL;  Surgeon: Merrie Roof, MD;  Location: [facility];  Service: General;  Laterality: N/A;  3-4 cm  . RECTAL SURGERY      by dr. Marlou Starks, removal of polyp  . TONSILLECTOMY       reports that she has never smoked. She has never used smokeless tobacco. She reports that she does not drink alcohol or use drugs.  Allergies  Allergen Reactions  . Demerol  [Meperidine Hcl] Other (See Comments)    Pt. Feels like she is suffocating  . Ertapenem Hives    Caused whole body to turn red Other reaction(s): Redness Caused whole body to turn red  . Codeine Other (See Comments)    hallucinations  . Demerol Other (See Comments)    hallucinations  . Morphine And Related Other (See Comments)    hallucinations  . Other Other (See Comments)    Invan 7- pt became red all over  . Sulfate Other (See Comments)    unknown    Family History  Problem Relation Age of Onset  . Cancer Sister        pt unaware of what kind  . High blood pressure Mother   . COPD Mother   . Heart attack Father      Prior to Admission medications   Medication Sig Start Date End Date Taking? Authorizing Provider  acetaminophen (TYLENOL) 325 MG tablet Take 650 mg by mouth every 6 (six) hours as needed.    [provider]  albuterol (PROVENTIL) (2.5 MG/3ML) 0.083% nebulizer solution Use 1 ampule in nebulizer 4 times a day or every 4 hours as needed to rescue asthma. 03/21/18 03/21/19  [name], MD  ALPRAZolam Duanne Moron) 0.5 MG tablet Take 1 tablet 3 x / day for Anxiety 02/17/18   Liane Comber, NP  aspirin 81 MG tablet Take 81 mg by mouth daily. Buys OTC    [provider]  Janne Lab Oil (VENELEX) OINT Apply to sacrum and bilateral buttocks q shift for prevention and prn s/p incontinence or erythema  every shift    [provider]  budesonide (PULMICORT) 0.5 MG/2ML nebulizer solution Take 2 mLs (0.5 mg total) by nebulization 2 (two) times daily. Dx-J45.909, J44.0, J20.9 02/17/18   Liane Comber, NP  carbidopa-levodopa (SINEMET IR) 25-100 MG tablet Take 2 tablet at 9am and 2 tablets at 3pm. 03/04/18   Narda Amber  K, DO  Cholecalciferol (VITAMIN D) 2000 units CAPS Take 2,000 capsules by mouth daily.    [provider]  ENSURE (ENSURE) Take 237 mLs by mouth daily.    [provider]  famotidine (PEPCID) 20 MG tablet Take 20 mg by mouth 2 (two) times daily.     [provider]  formoterol (PERFOROMIST) 20 MCG/2ML nebulizer solution Take 2 mLs (20 mcg total) by nebulization 2 (two) times daily. 09/29/17   Liane Comber, NP  furosemide (LASIX) 20 MG tablet Take 20 mg by mouth daily.    [provider]  losartan-hydrochlorothiazide (HYZAAR) 100-25 MG tablet Take  by mouth.    [provider]  omeprazole (PRILOSEC) 40 MG capsule Take 40 mg by mouth daily as needed (reflux,).     [provider]  polyethylene glycol (MIRALAX / GLYCOLAX) packet Take 17 g by mouth daily as needed for mild constipation. 01/11/18   Barton Dubois, MD  potassium chloride SA (K-DUR,KLOR-CON) 20 MEQ tablet Take 1 tablet by mouth daily. 02/17/18   Liane Comber, NP  sertraline (ZOLOFT) 25 MG tablet Take 1 tablet (25 mg total) by mouth daily. 02/17/18   Liane Comber, NP    Physical Exam: Vitals:   04/03/18 8546 04/03/18 0238 04/03/18 0245 04/03/18 0330  BP:    (!) 123/51  Pulse:   94 88  Resp:   19 17  Temp:      TempSrc:      SpO2: 95% 99% 99% 100%  Weight:      Height:          Constitutional: Not in acute distress, dyspneic with speech  Eyes: PERTLA, conjunctival injection on right ENMT: Mucous membranes are moist. Posterior pharynx clear of any exudate or lesions.   Neck: normal, supple, no masses, no thyromegaly Respiratory: Breath sounds diminished bilaterally. Prolonged expiratory phase. No accessory muscle use.  Cardiovascular: S1 & S2 heard, regular rate and rhythm. No extremity edema.   Abdomen: No distension, no tenderness, soft. Bowel sounds normal.  Musculoskeletal: no clubbing / cyanosis. No joint deformity upper and lower extremities.    Skin: no  significant rashes, lesions, ulcers. Warm, dry, well-perfused. Neurologic: No facial asymmetry. Sensation intact. Moving all extremities.  Psychiatric: Alert and oriented to person, place, and situation.     Labs on Admission: I have personally reviewed following labs and imaging studies  CBC: Recent Labs  Lab 04/03/18 0206  WBC 14.2*  NEUTROABS 7.2  HGB 15.8*  HCT 50.3*  MCV 91.8  PLT 270   Basic Metabolic Panel: Recent Labs  Lab 04/03/18 0206  NA 144  K 3.8  CL 103  CO2 30  GLUCOSE 127*  BUN 19  CREATININE 0.91  CALCIUM 9.3   GFR: Estimated Creatinine Clearance: 43.3 mL/min (by C-G formula based on SCr of 0.91 mg/dL). Liver Function Tests: No results for input(s): AST, ALT, ALKPHOS, BILITOT, PROT, ALBUMIN in the last 168 hours. No results for input(s): LIPASE, AMYLASE in the last 168 hours. No results for input(s): AMMONIA in the last 168 hours. Coagulation Profile: No results for input(s): INR, PROTIME in the last 168 hours. Cardiac Enzymes: No results for input(s): CKTOTAL, CKMB, CKMBINDEX, TROPONINI in the last 168 hours. BNP (last 3 results) No results for input(s): PROBNP in the last 8760 hours. HbA1C: No results for input(s): HGBA1C in the last 72 hours. CBG: No results for input(s): GLUCAP in the last 168 hours. Lipid Profile: No results for input(s): CHOL, HDL, LDLCALC, TRIG, CHOLHDL, LDLDIRECT in the last 72 hours. Thyroid Function Tests: No results for input(s): TSH, T4TOTAL, FREET4, T3FREE, THYROIDAB in the last 72 hours. Anemia Panel: No results for input(s): VITAMINB12, FOLATE, FERRITIN, TIBC, IRON, RETICCTPCT in the last 72 hours. Urine analysis:    Component Value Date/Time   COLORURINE YELLOW 01/22/2018 2300   APPEARANCEUR HAZY (A) 01/22/2018 2300   LABSPEC 1.012 01/22/2018 2300   PHURINE 7.0 01/22/2018 2300   GLUCOSEU NEGATIVE 01/22/2018 2300   HGBUR MODERATE (A) 01/22/2018 2300   BILIRUBINUR NEGATIVE 01/22/2018 2300   KETONESUR  NEGATIVE 01/22/2018 2300   PROTEINUR NEGATIVE 01/22/2018 2300   UROBILINOGEN 0.2  04/16/2015 2310   NITRITE NEGATIVE 01/22/2018 2300   LEUKOCYTESUR MODERATE (A) 01/22/2018 2300   Sepsis Labs: @LABRCNTIP (procalcitonin:4,lacticidven:4) )No results found for this or any previous visit (from the past 240 hour(s)).   Radiological Exams on Admission: Dg Chest Port 1 View  Result Date: 04/03/2018 CLINICAL DATA:  Shortness of breath.  Wheezing. EXAM: PORTABLE CHEST 1 VIEW COMPARISON:  01/24/2018 FINDINGS: The cardiomediastinal contours are normal. Resolved retrocardiac opacity from prior exam. Central bronchial thickening. Pulmonary vasculature is normal. No new consolidation, pleural effusion, or pneumothorax. No acute osseous abnormalities are seen. IMPRESSION: Central bronchial thickening, increased from prior. Resolved retrocardiac opacity. Electronically Signed   By: Jeb Levering M.D.   On: 04/03/2018 02:12    EKG: Independently reviewed. Sinus rhythm, LVH.   Assessment/Plan   1. COPD with acute exacerbation  - Presents with 1 day of SOB and wheezing  - CXR clear, no fever or leukocytosis, no peripheral edema or JVD - In distress on arrival, treated with 125 mg IV Solu-Medrol, nebs, and CPAP with EMS prior to arrival, started on BiPAP in ED  - Continue ICS/LABA, continue albuterol nebs, check sputum culture, continue systemic steroid, start abx   2. Chronic diastolic CHF  - Appears well-compensated  - Follow daily wts, continue Lasix and ARB    3. Parkinson's disease  - Continue Sinemet    4. Hypertension  - BP at goal - Continue losartan-HCTZ    5. Anxiety  - Continue Xanax and Zoloft   DVT prophylaxis: Lovenox  Code Status: DNR  Family Communication: Husband and daughter updated at bedside Consults called: None Admission status: Inpatient    Vianne Bulls, MD [facility] Pager [phone]  If 7PM-7AM, please contact  night-coverage [email or web address] Password [facility]  04/03/2018, 3:39 AM

## 2018-04-04 ENCOUNTER — Other Ambulatory Visit: Payer: Self-pay | Admitting: Internal Medicine

## 2018-04-04 DIAGNOSIS — J9611 Chronic respiratory failure with hypoxia: Secondary | ICD-10-CM

## 2018-04-04 DIAGNOSIS — Z9981 Dependence on supplemental oxygen: Secondary | ICD-10-CM

## 2018-04-04 DIAGNOSIS — I5032 Chronic diastolic (congestive) heart failure: Secondary | ICD-10-CM

## 2018-04-04 DIAGNOSIS — Z1231 Encounter for screening mammogram for malignant neoplasm of breast: Secondary | ICD-10-CM

## 2018-04-04 LAB — GLUCOSE, CAPILLARY: Glucose-Capillary: 128 mg/dL — ABNORMAL HIGH (ref 65–99)

## 2018-04-04 MED ORDER — AZITHROMYCIN 500 MG PO TABS: 500.0000 mg | ORAL_TABLET | Freq: Every day | ORAL | 0 refills | Status: AC

## 2018-04-04 MED ORDER — BUDESONIDE-FORMOTEROL FUMARATE 80-4.5 MCG/ACT IN AERO: 2.0000 | INHALATION_SPRAY | Freq: Two times a day (BID) | RESPIRATORY_TRACT | 12 refills | Status: DC

## 2018-04-04 MED ORDER — TIOTROPIUM BROMIDE MONOHYDRATE 18 MCG IN CAPS: 18.0000 ug | ORAL_CAPSULE | Freq: Every day | RESPIRATORY_TRACT | 2 refills | Status: DC

## 2018-04-04 MED ORDER — PREDNISONE 10 MG PO TABS: 10.0000 mg | ORAL_TABLET | Freq: Every day | ORAL | 0 refills | Status: DC

## 2018-04-04 MED ORDER — ALBUTEROL SULFATE (2.5 MG/3ML) 0.083% IN NEBU: INHALATION_SOLUTION | RESPIRATORY_TRACT | 3 refills | Status: DC

## 2018-04-04 NOTE — Progress Notes (Signed)
IV removed, 2x2 gauze and paper tape applied to site, patient tolerated well.  Reviewed AVS with patient and patient's husband, both verbalized understanding.  Patient transported home by husband.   

## 2018-04-04 NOTE — Discharge Summary (Signed)
Physician Discharge Summary  Marie [name] HWE:[id number] DOB: [birth year] DOA: 04/03/2018  PCP: [name], MD  Admit date: 04/03/2018 Discharge date: 04/04/2018  Time spent: 45 minutes  Recommendations for Outpatient Follow-up:  -To be discharged home today. -Advise follow-up with primary care provider in 2 weeks. -COPD regimen has been optimized to include Spiriva, Symbicort as well as a rescue albuterol inhaler.  Discharge Diagnoses:  Principal Problem:   COPD with acute exacerbation ([place]) Active Problems:   Essential hypertension   Depression, major, in remission ([place])   Parkinson's plus syndrome ([place])   Chronic respiratory failure with hypoxia, on home O2 therapy ([facility])   Chronic diastolic CHF (congestive heart failure) ([place])   Discharge Condition: Stable and improved  Filed Weights   04/03/18 0220 04/03/18 0500  Weight: 59 kg (130 lb) 56.3 kg (124 lb 1.9 oz)    History of present illness:  As per Dr. Myna Hidalgo on 6/16: Marie [name] is a 82 y.o. female with medical history significant for Parkinson's, chronic diastolic CHF, depression with anxiety, and COPD with chronic hypoxic respiratory failure, now presenting to the emergency department for evaluation of worsening shortness of breath.  Patient reports that she was in her usual state of health yesterday, but noted worsening in her chronic dyspnea and chronic cough several hours prior to arrival in the ED.  She denies recent chest pain, fevers, or chills.  She denies swelling or tenderness in the lower extremities.  EMS was called due to her worsening shortness of breath and the patient was treated with 125 mg of IV Solu-Medrol, albuterol, and CPAP prior to arrival in the ED.  ED Course: Upon arrival to the ED, patient is found to be afebrile, saturating adequately on BiPAP, and with vitals otherwise normal.  EKG features a sinus rhythm with LVH and chest x-ray is notable for increased central bronchial thickening  and resolved retrocardiac opacity.  Chemistry panel is unremarkable and CBC is notable for a leukocytosis to 14,200 and mild polycythemia.  Patient was treated with duo nebs, 2 g IV magnesium, and BiPAP in the ED.  She remains hemodynamically stable and will be admitted for ongoing evaluation and management of acute exacerbation in COPD.     Hospital Course:   Acute on chronic hypoxemic respiratory failure -Due to COPD with acute exacerbation. -She is currently doing well, has no baseline oxygen requirements. -We will discharge with 5 days of azithromycin, prednisone taper as well as optimization of her COPD regimen to include Symbicort, Spiriva and rescue albuterol inhaler. -She is advised to follow-up with pulmonology within 2 to 3 weeks.  Chronic diastolic heart failure -Well-controlled, continue Lasix and ARB.  Parkinson's disease -Continue Sinemet  Hypertension -Blood pressure goal  Anxiety -Continue Zoloft and Xanax  Procedures:  None  Consultations:  None  Discharge Instructions  Discharge Instructions    Diet - low sodium heart healthy   Complete by:  As directed    Increase activity slowly   Complete by:  As directed      Allergies as of 04/04/2018      Reactions   Demerol  [meperidine Hcl] Other (See Comments)   Pt. Feels like she is suffocating   Ertapenem Hives   Caused whole body to turn red Other reaction(s): Redness Caused whole body to turn red   Codeine Other (See Comments)   hallucinations   Demerol Other (See Comments)   hallucinations   Morphine And Related Other (See Comments)   hallucinations  Other Other (See Comments)   Invan 7- pt became red all over   Sulfate Other (See Comments)   unknown      Medication List    STOP taking these medications   budesonide 0.5 MG/2ML nebulizer solution Commonly known as:  PULMICORT   formoterol 20 MCG/2ML nebulizer solution Commonly known as:  PERFOROMIST     TAKE these medications     acetaminophen 325 MG tablet Commonly known as:  TYLENOL Take 650 mg by mouth every 6 (six) hours as needed.   albuterol (2.5 MG/3ML) 0.083% nebulizer solution Commonly known as:  PROVENTIL Use 1 ampule in nebulizer 4 times a day or every 4 hours as needed to rescue asthma.   ALPRAZolam 0.5 MG tablet Commonly known as:  XANAX Take 1 tablet 3 x / day for Anxiety   amitriptyline 10 MG tablet Commonly known as:  ELAVIL Take 10 mg by mouth 3 (three) times daily as needed (depression and mood).   aspirin 81 MG tablet Take 81 mg by mouth daily. Buys OTC   azithromycin 500 MG tablet Commonly known as:  ZITHROMAX Take 1 tablet (500 mg total) by mouth daily for 5 days. Take 1 tablet daily for 3 days.   budesonide-formoterol 80-4.5 MCG/ACT inhaler Commonly known as:  SYMBICORT Inhale 2 puffs into the lungs 2 (two) times daily.   carbidopa-levodopa 25-100 MG tablet Commonly known as:  SINEMET IR Take 2 tablet at 9am and 2 tablets at 3pm. What changed:  additional instructions   ENSURE Take 237 mLs by mouth daily.   famotidine 20 MG tablet Commonly known as:  PEPCID Take 20 mg by mouth 2 (two) times daily.   furosemide 20 MG tablet Commonly known as:  LASIX Take 20 mg by mouth daily.   olmesartan 20 MG tablet Commonly known as:  BENICAR Take 20 mg by mouth daily.   omeprazole 40 MG capsule Commonly known as:  PRILOSEC Take 40 mg by mouth daily as needed (reflux,).   OXYGEN Inhale into the lungs. Setting is on 2 when needed   potassium chloride SA 20 MEQ tablet Commonly known as:  K-DUR,KLOR-CON Take 1 tablet by mouth daily.   prednisoLONE acetate 1 % ophthalmic suspension Commonly known as:  PRED FORTE Place 1 drop into the right eye 4 (four) times daily. For 14 days at bedtime   predniSONE 10 MG tablet Commonly known as:  DELTASONE Take 10 mg by mouth daily with breakfast. What changed:  Another medication with the same name was added. Make sure you understand  how and when to take each.   predniSONE 10 MG tablet Commonly known as:  DELTASONE Take 1 tablet (10 mg total) by mouth daily with breakfast. Take 6 tablets today and then decrease by 1 tablet daily until none are left. What changed:  You were already taking a medication with the same name, and this prescription was added. Make sure you understand how and when to take each.   sertraline 25 MG tablet Commonly known as:  ZOLOFT Take 1 tablet (25 mg total) by mouth daily.   tiotropium 18 MCG inhalation capsule Commonly known as:  SPIRIVA HANDIHALER Place 1 capsule (18 mcg total) into inhaler and inhale daily.   Vitamin D 2000 units Caps Take 2,000 capsules by mouth daily.      Allergies  Allergen Reactions  . Demerol  [Meperidine Hcl] Other (See Comments)    Pt. Feels like she is suffocating  . Ertapenem Hives  Caused whole body to turn red Other reaction(s): Redness Caused whole body to turn red  . Codeine Other (See Comments)    hallucinations  . Demerol Other (See Comments)    hallucinations  . Morphine And Related Other (See Comments)    hallucinations  . Other Other (See Comments)    Invan 7- pt became red all over  . Sulfate Other (See Comments)    unknown   Follow-up Information    [name], MD. Schedule an appointment as soon as possible for a visit in 2 weeks.   Specialty:  Internal Medicine Contact information: [address] [place] [address] [phone]            The results of significant diagnostics from this hospitalization (including imaging, microbiology, ancillary and laboratory) are listed below for reference.    Significant Diagnostic Studies: Dg Chest Port 1 View  Result Date: 04/03/2018 CLINICAL DATA:  Shortness of breath.  Wheezing. EXAM: PORTABLE CHEST 1 VIEW COMPARISON:  01/24/2018 FINDINGS: The cardiomediastinal contours are normal. Resolved retrocardiac opacity from prior exam. Central bronchial  thickening. Pulmonary vasculature is normal. No new consolidation, pleural effusion, or pneumothorax. No acute osseous abnormalities are seen. IMPRESSION: Central bronchial thickening, increased from prior. Resolved retrocardiac opacity. Electronically Signed   By: Jeb Levering M.D.   On: 04/03/2018 02:12    Microbiology: Recent Results (from the past 240 hour(s))  MRSA PCR Screening     Status: Abnormal   Collection Time: 04/03/18  4:50 AM  Result Value Ref Range Status   MRSA by PCR POSITIVE (A) NEGATIVE Final    Comment:        The GeneXpert MRSA Assay (FDA approved for NASAL specimens only), is one component of a comprehensive MRSA colonization surveillance program. It is not intended to diagnose MRSA infection nor to guide or monitor treatment for MRSA infections. RESULT CALLED TO, READ BACK BY AND VERIFIED WITH: JACKSON,N @1607  BY MATTHEWS, B 6.16.19 Performed at [facility], [address] [place], [place] [address]      Labs: Basic Metabolic Panel: Recent Labs  Lab 04/03/18 0206  NA 144  K 3.8  CL 103  CO2 30  GLUCOSE 127*  BUN 19  CREATININE 0.91  CALCIUM 9.3   Liver Function Tests: No results for input(s): AST, ALT, ALKPHOS, BILITOT, PROT, ALBUMIN in the last 168 hours. No results for input(s): LIPASE, AMYLASE in the last 168 hours. No results for input(s): AMMONIA in the last 168 hours. CBC: Recent Labs  Lab 04/03/18 0206  WBC 14.2*  NEUTROABS 7.2  HGB 15.8*  HCT 50.3*  MCV 91.8  PLT 286   Cardiac Enzymes: No results for input(s): CKTOTAL, CKMB, CKMBINDEX, TROPONINI in the last 168 hours. BNP: BNP (last 3 results) Recent Labs    11/11/17 1628 12/15/17 0216 01/07/18 1722  BNP 493* 129.1* 205.0*    ProBNP (last 3 results) No results for input(s): PROBNP in the last 8760 hours.  CBG: Recent Labs  Lab 04/03/18 0739 04/04/18 0724  GLUCAP 193* 128*       Signed:  Lelon Frohlich  [facility] Pager:  [phone] 04/04/2018, 3:04 PM

## 2018-04-04 NOTE — Care Management Note (Signed)
Case Management Note  Patient Details  Name: Marie [name] MRN: [id number] Date of Birth: 09/29/1936  Subjective/Objective:   COPD exacerbation. Pt from home, with husband. Sent to SNF after last hospitalization and is now back home, was recently [place] from [facility] services.                  Action/Plan: DC home today. Pt interested in COPD program with [facility]. Referral sent to rep, Vaughan Basta. Pt aware HH has 48 hrs to make first visit. Pt was not able to see pulm while in hospital but CM has discussed with Dr. Luan Pulling and he will complete process for COPD program from his office.   Expected Discharge Date:  04/04/18               Expected Discharge Plan:  [place]  In-House Referral:  NA  Discharge planning Services  CM Consult  Post Acute Care Choice:  Home Health Choice offered to:  Patient  HH Arranged:  RN, Respirator Therapy [place] Agency:  [place]  Status of Service:  Completed, signed off  If discussed at [place] of Stay Meetings, dates discussed:    Additional Comments:  Sherald Barge, RN 04/04/2018, 12:47 PM

## 2018-04-04 NOTE — Care Management (Signed)
CM received call from [facility] rep. Pt currently active with [facility]. CM contacted pt, Spoke with her husband and daughter also, explained she is active with [facility] and explained she could not proceed with COPD program and cont services with [place]. They would like to revoke services with [facility] and proceed with COPD program. [facility] rep, Ronalee Belts, made aware. [facility] rep, Vaughan Basta, aware as well.

## 2018-04-05 DIAGNOSIS — J441 Chronic obstructive pulmonary disease with (acute) exacerbation: Secondary | ICD-10-CM | POA: Diagnosis not present

## 2018-04-05 DIAGNOSIS — Z9981 Dependence on supplemental oxygen: Secondary | ICD-10-CM | POA: Diagnosis not present

## 2018-04-05 DIAGNOSIS — G20 Parkinson's disease: Secondary | ICD-10-CM | POA: Diagnosis not present

## 2018-04-05 DIAGNOSIS — M810 Age-related osteoporosis without current pathological fracture: Secondary | ICD-10-CM | POA: Diagnosis not present

## 2018-04-05 DIAGNOSIS — I5032 Chronic diastolic (congestive) heart failure: Secondary | ICD-10-CM | POA: Diagnosis not present

## 2018-04-05 DIAGNOSIS — M19042 Primary osteoarthritis, left hand: Secondary | ICD-10-CM | POA: Diagnosis not present

## 2018-04-05 DIAGNOSIS — M19041 Primary osteoarthritis, right hand: Secondary | ICD-10-CM | POA: Diagnosis not present

## 2018-04-05 DIAGNOSIS — K219 Gastro-esophageal reflux disease without esophagitis: Secondary | ICD-10-CM | POA: Diagnosis not present

## 2018-04-05 DIAGNOSIS — J9621 Acute and chronic respiratory failure with hypoxia: Secondary | ICD-10-CM | POA: Diagnosis not present

## 2018-04-05 DIAGNOSIS — I110 Hypertensive heart disease with heart failure: Secondary | ICD-10-CM | POA: Diagnosis not present

## 2018-04-05 DIAGNOSIS — E785 Hyperlipidemia, unspecified: Secondary | ICD-10-CM | POA: Diagnosis not present

## 2018-04-05 DIAGNOSIS — F329 Major depressive disorder, single episode, unspecified: Secondary | ICD-10-CM | POA: Diagnosis not present

## 2018-04-05 DIAGNOSIS — F418 Other specified anxiety disorders: Secondary | ICD-10-CM | POA: Diagnosis not present

## 2018-04-05 DIAGNOSIS — G629 Polyneuropathy, unspecified: Secondary | ICD-10-CM | POA: Diagnosis not present

## 2018-04-05 DIAGNOSIS — Z7982 Long term (current) use of aspirin: Secondary | ICD-10-CM | POA: Diagnosis not present

## 2018-04-05 DIAGNOSIS — Z7952 Long term (current) use of systemic steroids: Secondary | ICD-10-CM | POA: Diagnosis not present

## 2018-04-05 DIAGNOSIS — Z7951 Long term (current) use of inhaled steroids: Secondary | ICD-10-CM | POA: Diagnosis not present

## 2018-04-05 NOTE — Care Management (Signed)
Cm contacted by pharmacy who was contacted by pt asking about people bringing her medications to her home. Pt may be confusing COPD program through [facility]. CM called daughter (Jan) to clairfy program and that pt needs to have medications filled that were sent to [facility] and take them according to her AVS. Per husband pt was not given AVS (daughter believes he may have misplaced it), discharging RN documented AVS was reviewed. CM will fax copy of AVS to daughter Jan.

## 2018-04-11 ENCOUNTER — Other Ambulatory Visit: Payer: Self-pay | Admitting: *Deleted

## 2018-04-11 ENCOUNTER — Telehealth: Payer: Self-pay | Admitting: *Deleted

## 2018-04-11 DIAGNOSIS — G629 Polyneuropathy, unspecified: Secondary | ICD-10-CM | POA: Diagnosis not present

## 2018-04-11 DIAGNOSIS — J9621 Acute and chronic respiratory failure with hypoxia: Secondary | ICD-10-CM | POA: Diagnosis not present

## 2018-04-11 DIAGNOSIS — I110 Hypertensive heart disease with heart failure: Secondary | ICD-10-CM | POA: Diagnosis not present

## 2018-04-11 DIAGNOSIS — J441 Chronic obstructive pulmonary disease with (acute) exacerbation: Secondary | ICD-10-CM | POA: Diagnosis not present

## 2018-04-11 DIAGNOSIS — I5032 Chronic diastolic (congestive) heart failure: Secondary | ICD-10-CM | POA: Diagnosis not present

## 2018-04-11 DIAGNOSIS — G20 Parkinson's disease: Secondary | ICD-10-CM | POA: Diagnosis not present

## 2018-04-11 IMAGING — DX DG CHEST 2V
2 series · 2 of 2 positions shown · non-contrast
Comparison: 01/07/2018

CLINICAL DATA: Shortness of breath for 1 day

EXAM:
CHEST - 2 VIEW

[chest pa]
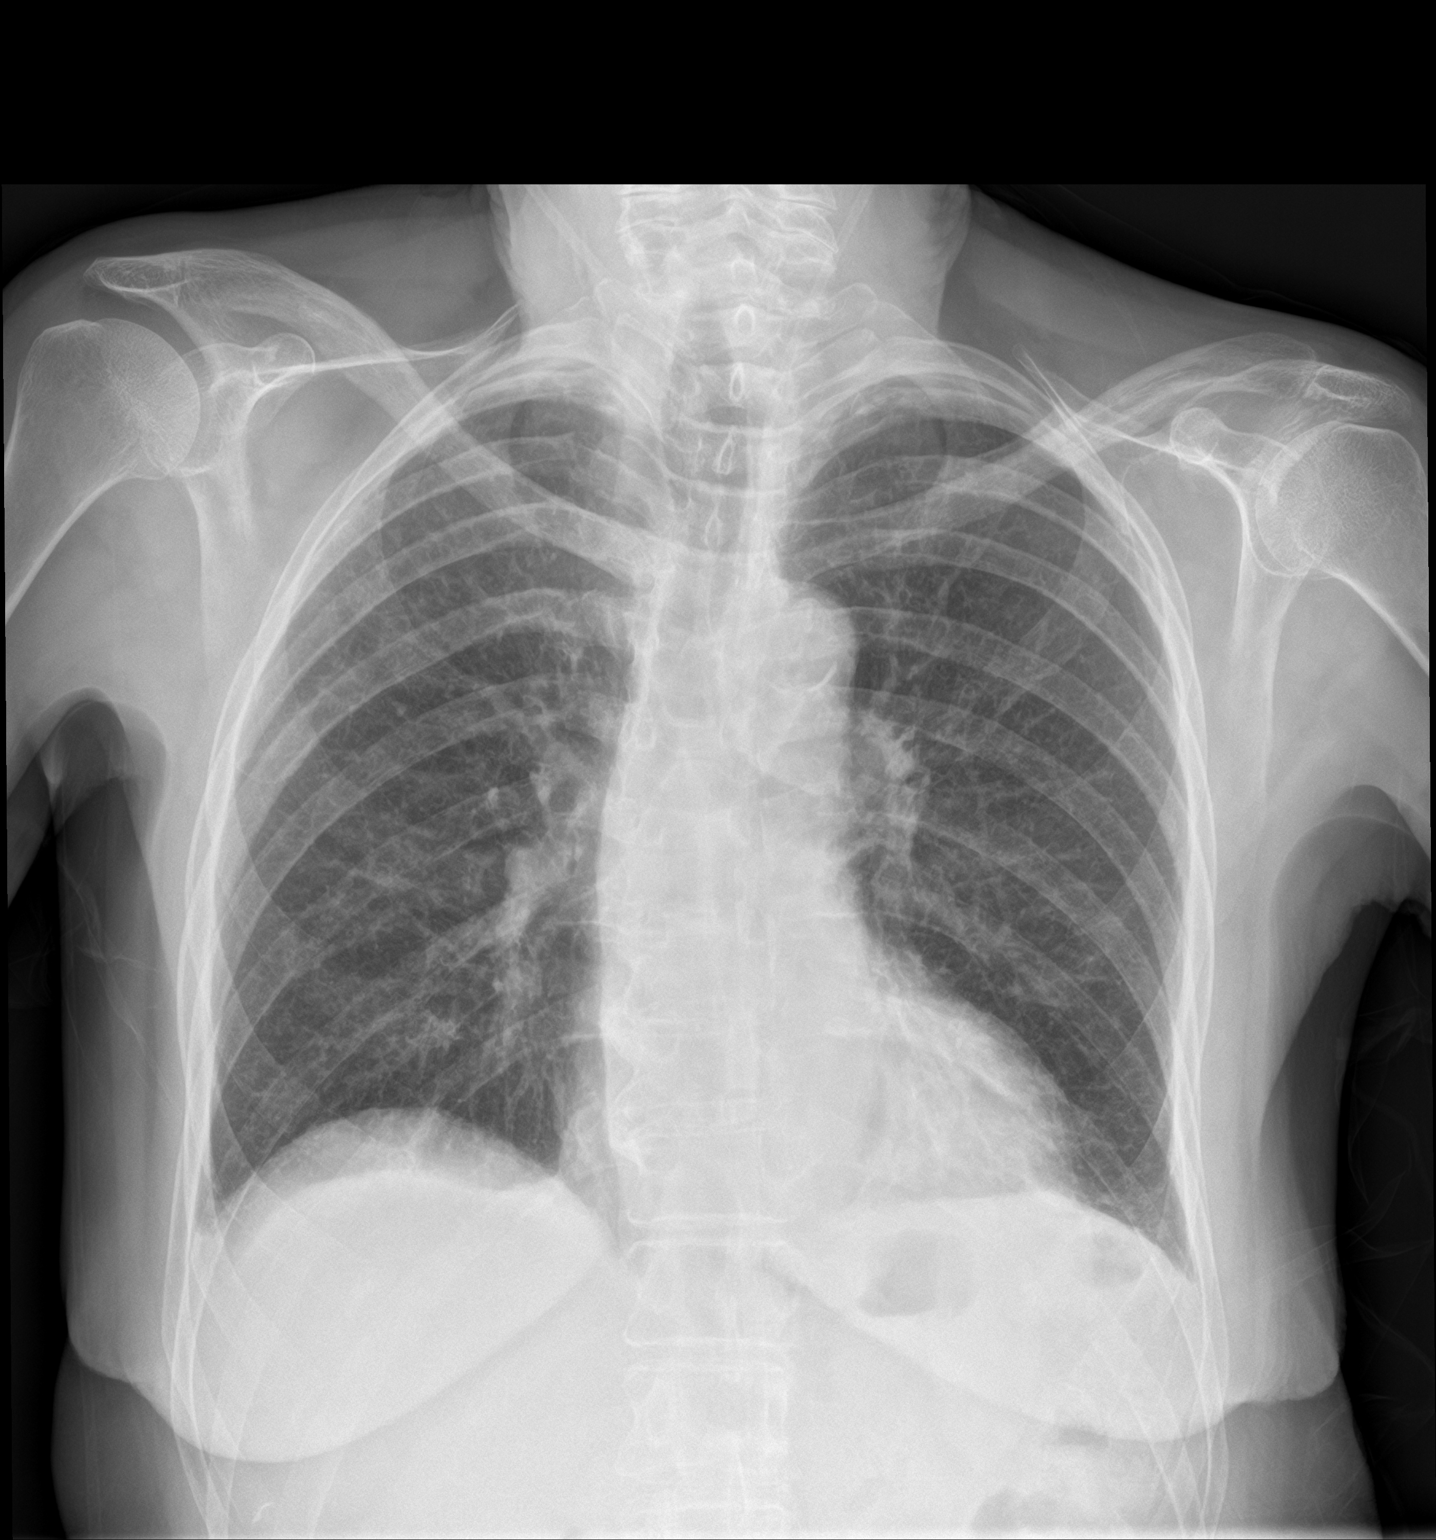

[chest lat]
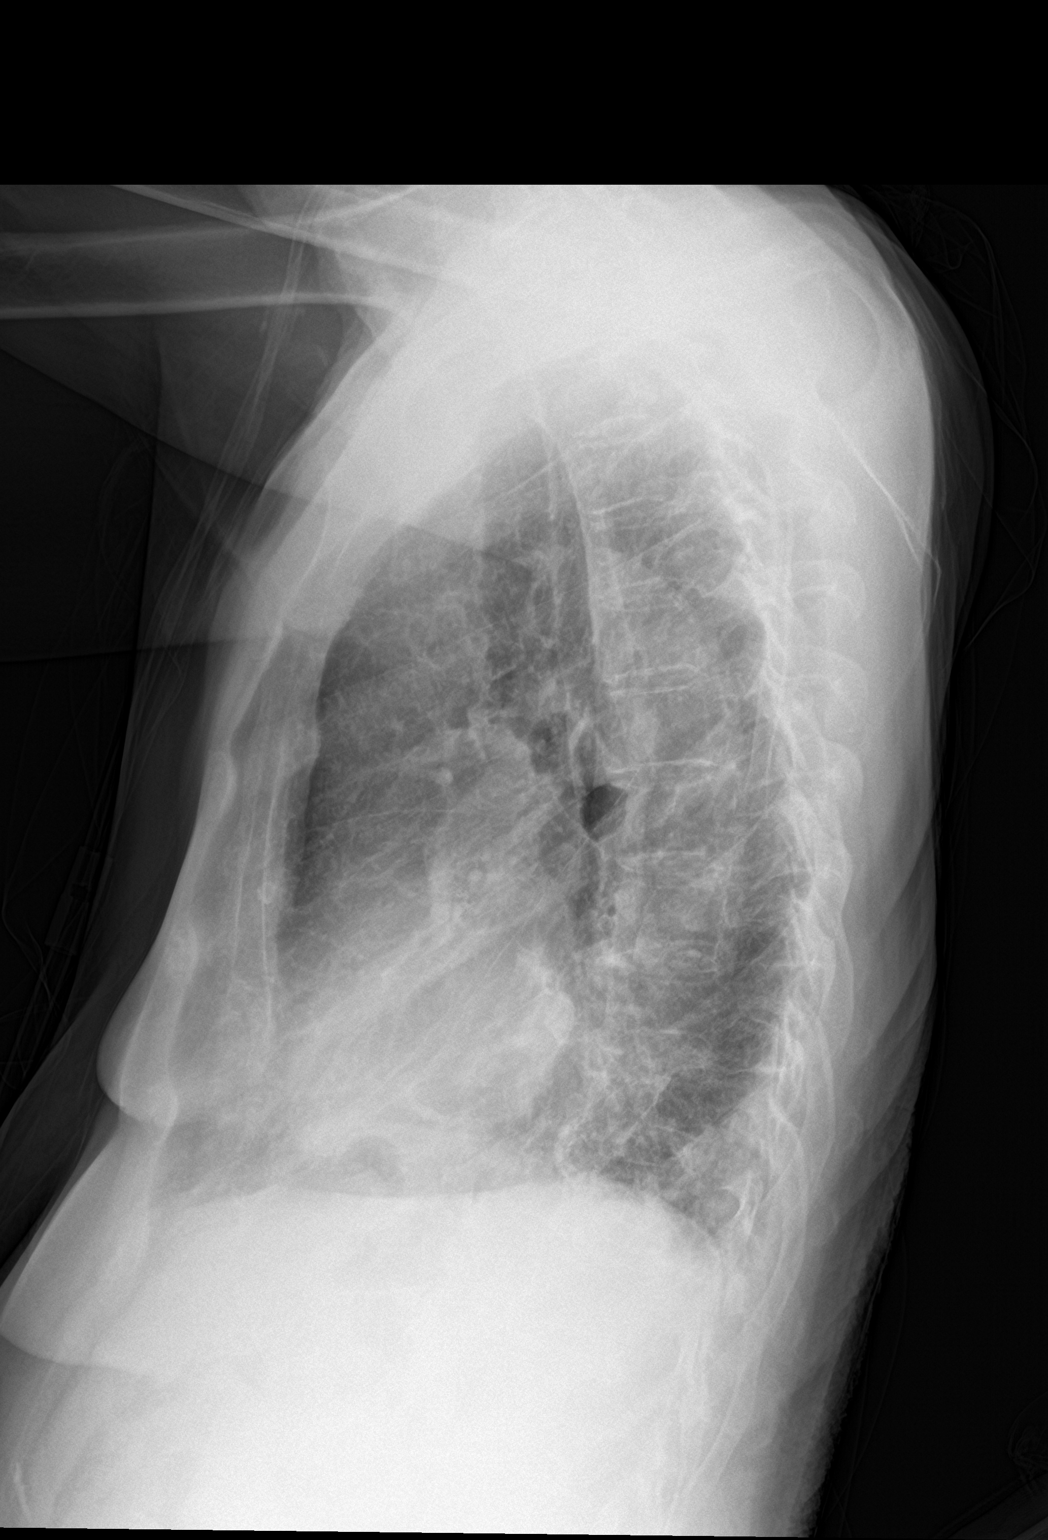

[2 of 2 positions shown; findings below may reference images not displayed]

FINDINGS: Cardiac shadow is stable. Aortic calcifications are again seen. Mild
interstitial changes are noted bilaterally. Some increased left
retrocardiac density is noted consistent with early infiltrate.
IMPRESSION: Early left retrocardiac infiltrate.

## 2018-04-11 NOTE — Telephone Encounter (Signed)
Called patient on 04/11/2018 , 9:31 AM in an attempt to reach the patient for a hospital follow up.   Admit date: 04/03/18 Discharge: 04/04/18   She DOES NOT  have any questions or concerns about medications from the hospital admission. The patient's medications were reviewed over the phone, they were counseled to bring in all current medications to the hospital follow up visit.   I advised the patient to call if any questions or concerns arise about the hospital admission or medications    Home health WAS  started in the hospital.  All questions were answered and a follow up appointment was made.   Prior to Admission medications   Medication Sig Start Date End Date Taking? Authorizing Provider  acetaminophen (TYLENOL) 325 MG tablet Take 650 mg by mouth every 6 (six) hours as needed.    [provider]  albuterol (PROVENTIL) (2.5 MG/3ML) 0.083% nebulizer solution Use 1 ampule in nebulizer 4 times a day or every 4 hours as needed to rescue asthma. 04/04/18 04/04/19  Isaac Bliss, Rayford Halsted, MD  ALPRAZolam Duanne Moron) 0.5 MG tablet Take 1 tablet 3 x / day for Anxiety 02/17/18   Liane Comber, NP  amitriptyline (ELAVIL) 10 MG tablet Take 10 mg by mouth 3 (three) times daily as needed (depression and mood).    [provider]  aspirin 81 MG tablet Take 81 mg by mouth daily. Buys OTC    [provider]  budesonide-formoterol (SYMBICORT) 80-4.5 MCG/ACT inhaler Inhale 2 puffs into the lungs 2 (two) times daily. 04/04/18   Isaac Bliss, Rayford Halsted, MD  carbidopa-levodopa (SINEMET IR) 25-100 MG tablet Take 2 tablet at 9am and 2 tablets at 3pm. Patient taking differently: Take 1 and a half  tablet a day. 03/04/18   Narda Amber K, DO  Cholecalciferol (VITAMIN D) 2000 units CAPS Take 2,000 capsules by mouth daily.    [provider]  ENSURE (ENSURE) Take 237 mLs by mouth daily.    [provider]  famotidine (PEPCID) 20 MG tablet Take 20 mg by mouth 2 (two) times  daily.     [provider]  furosemide (LASIX) 20 MG tablet Take 20 mg by mouth daily.    [provider]  olmesartan (BENICAR) 20 MG tablet Take 20 mg by mouth daily.    [provider]  omeprazole (PRILOSEC) 40 MG capsule Take 40 mg by mouth daily as needed (reflux,).     [provider]  OXYGEN Inhale into the lungs. Setting is on 2 when needed    [provider]  potassium chloride SA (K-DUR,KLOR-CON) 20 MEQ tablet Take 1 tablet by mouth daily. 02/17/18   Liane Comber, NP  prednisoLONE acetate (PRED FORTE) 1 % ophthalmic suspension Place 1 drop into the right eye 4 (four) times daily. For 14 days at bedtime 03/23/18   [provider]  predniSONE (DELTASONE) 10 MG tablet Take 10 mg by mouth daily with breakfast.    [provider]  predniSONE (DELTASONE) 10 MG tablet Take 1 tablet (10 mg total) by mouth daily with breakfast. Take 6 tablets today and then decrease by 1 tablet daily until none are left. 04/04/18   Isaac Bliss, Rayford Halsted, MD  sertraline (ZOLOFT) 25 MG tablet Take 1 tablet (25 mg total) by mouth daily. 02/17/18   Liane Comber, NP  tiotropium (SPIRIVA HANDIHALER) 18 MCG inhalation capsule Place 1 capsule (18 mcg total) into inhaler and inhale daily. 04/04/18 05/04/18  Isaac Bliss, Rayford Halsted,  MD

## 2018-04-11 NOTE — Patient Outreach (Addendum)
[facility]) Care Management  04/11/2018  Vaniya Augspurger Marbach 1936/05/15 [id number]   EMMI-  RED ON EMMI ALERT Day # 4 Date: 04/09/18 Red Alert Reason: got discharge papers?  No  scheduled follow up? No   Outreach attempt # 1 Patient's spouse, [name],  is able to verify HIPAA as Mrs Sonnenfeld is being seen by her [facility] RN [place] Management RN reviewed and addressed red alert with patient's spouse who confirmed they were not given discharge instructions upon leaving the hospital.  Mr Dundon reports a female staff stated they were ready to go and no paper work was provided. [facility] CM reviewed the 04/04/18 discharge instructions to confirm that Mrs Hearst has all medications listed and the changes were made at home. She has completed her Zithromax and has to take the last tapering dose of prednisone today.  She has stopped her budesonide and formoterol nebulizers and is taking budesonide-formoterol and tiotropium instead. Her diet and activity plus CHF education information reviewed Cm also reviewed Transition of care assessment to confirm Mr & Mrs Heathcock were without needs on today Mr Hamor denies further needs or educational information needs to be sent other than what was reviewed today and is in the d/c instructions that was not given upon d/c  Mr Givhan voiced appreciation of the call and review of the d/c instructions  An appointment with Dr Melford Aase was made today for April 13 2018 at 1130 for a hospital follow up by Mr Joy   Advised patient that there will be further automated EMMI- post discharge calls to assess how the patient is doing following the recent hospitalization Advised the patient that another call may be received from a nurse if any of their responses were abnormal. Patient voiced understanding and was appreciative of f/u call.  Plan: [facility] RN CM will close case at this time as patient has been assessed and no needs identified.   [facility] RN CM will send a copy of the discharge  instructions to Mrs and Mr Strieter via mail Encouraged a call to 24 hour RN line prn    Joelene Millin L. Lavina Hamman, RN, BSN, CCM [facility] Telephonic Care Management Care Coordinator Direct number [phone]  [facility] number [phone] Fax number [phone]

## 2018-04-12 DIAGNOSIS — G20 Parkinson's disease: Secondary | ICD-10-CM | POA: Diagnosis not present

## 2018-04-12 DIAGNOSIS — I110 Hypertensive heart disease with heart failure: Secondary | ICD-10-CM | POA: Diagnosis not present

## 2018-04-12 DIAGNOSIS — J9621 Acute and chronic respiratory failure with hypoxia: Secondary | ICD-10-CM | POA: Diagnosis not present

## 2018-04-12 DIAGNOSIS — J441 Chronic obstructive pulmonary disease with (acute) exacerbation: Secondary | ICD-10-CM | POA: Diagnosis not present

## 2018-04-12 DIAGNOSIS — G629 Polyneuropathy, unspecified: Secondary | ICD-10-CM | POA: Diagnosis not present

## 2018-04-12 DIAGNOSIS — I5032 Chronic diastolic (congestive) heart failure: Secondary | ICD-10-CM | POA: Diagnosis not present

## 2018-04-13 ENCOUNTER — Ambulatory Visit (INDEPENDENT_AMBULATORY_CARE_PROVIDER_SITE_OTHER): Payer: Medicare Other | Admitting: Internal Medicine

## 2018-04-13 ENCOUNTER — Encounter: Payer: Self-pay | Admitting: Internal Medicine

## 2018-04-13 VITALS — BP 108/42 | HR 80 | Temp 97.3°F | Resp 16 | Ht 64.0 in | Wt 125.0 lb

## 2018-04-13 DIAGNOSIS — Z79899 Other long term (current) drug therapy: Secondary | ICD-10-CM | POA: Diagnosis not present

## 2018-04-13 DIAGNOSIS — J9611 Chronic respiratory failure with hypoxia: Secondary | ICD-10-CM

## 2018-04-13 DIAGNOSIS — Z9981 Dependence on supplemental oxygen: Secondary | ICD-10-CM | POA: Diagnosis not present

## 2018-04-13 DIAGNOSIS — J9621 Acute and chronic respiratory failure with hypoxia: Secondary | ICD-10-CM

## 2018-04-13 DIAGNOSIS — G232 Striatonigral degeneration: Secondary | ICD-10-CM | POA: Diagnosis not present

## 2018-04-13 DIAGNOSIS — J441 Chronic obstructive pulmonary disease with (acute) exacerbation: Secondary | ICD-10-CM

## 2018-04-13 DIAGNOSIS — I10 Essential (primary) hypertension: Secondary | ICD-10-CM

## 2018-04-13 DIAGNOSIS — F325 Major depressive disorder, single episode, in full remission: Secondary | ICD-10-CM | POA: Diagnosis not present

## 2018-04-13 DIAGNOSIS — G20C Parkinsonism, unspecified: Secondary | ICD-10-CM

## 2018-04-13 DIAGNOSIS — I5032 Chronic diastolic (congestive) heart failure: Secondary | ICD-10-CM

## 2018-04-13 LAB — COMPLETE METABOLIC PANEL WITH GFR
AG Ratio: 1.6 (calc) (ref 1.0–2.5)
ALBUMIN MSPROF: 3.7 g/dL (ref 3.6–5.1)
ALKALINE PHOSPHATASE (APISO): 53 U/L (ref 33–130)
ALT: 21 U/L (ref 6–29)
AST: 15 U/L (ref 10–35)
BUN/Creatinine Ratio: 24 (calc) — ABNORMAL HIGH (ref 6–22)
BUN: 25 mg/dL (ref 7–25)
CO2: 31 mmol/L (ref 20–32)
CREATININE: 1.03 mg/dL — AB (ref 0.60–0.93)
Calcium: 9.1 mg/dL (ref 8.6–10.4)
Chloride: 106 mmol/L (ref 98–110)
GFR, Est African American: 60 mL/min/{1.73_m2} (ref >=60)
GFR, Est Non African American: 52 mL/min/{1.73_m2} — ABNORMAL LOW (ref >=60)
GLUCOSE: 82 mg/dL (ref 65–99)
Globulin: 2.3 g/dL (calc) (ref 1.9–3.7)
POTASSIUM: 4.7 mmol/L (ref 3.5–5.3)
Sodium: 145 mmol/L (ref 135–146)
Total Bilirubin: 0.6 mg/dL (ref 0.2–1.2)
Total Protein: 6 g/dL — ABNORMAL LOW (ref 6.1–8.1)

## 2018-04-13 LAB — CBC WITH DIFFERENTIAL/PLATELET
BASOS ABS: 40 {cells}/uL (ref 0–200)
Basophils Relative: 0.4 %
Eosinophils Absolute: 210 cells/uL (ref 15–500)
Eosinophils Relative: 2.1 %
HCT: 47.2 % — ABNORMAL HIGH (ref 35.0–45.0)
HEMOGLOBIN: 15.5 g/dL (ref 11.7–15.5)
Lymphs Abs: 3240 cells/uL (ref 850–3900)
MCH: 28.1 pg (ref 27.0–33.0)
MCHC: 32.8 g/dL (ref 32.0–36.0)
MCV: 85.7 fL (ref 80.0–100.0)
MPV: 10.8 fL (ref 7.5–12.5)
Monocytes Relative: 8.1 %
NEUTROS ABS: 5700 {cells}/uL (ref 1500–7800)
Neutrophils Relative %: 57 %
Platelets: 283 10*3/uL (ref 140–400)
RBC: 5.51 10*6/uL — ABNORMAL HIGH (ref 3.80–5.10)
RDW: 13.7 % (ref 11.0–15.0)
Total Lymphocyte: 32.4 %
WBC mixed population: 810 cells/uL (ref 200–950)
WBC: 10 10*3/uL (ref 3.8–10.8)

## 2018-04-13 NOTE — Progress Notes (Signed)
[place]     This very nice 82 y.o.MWF was admitted to the hospital on  04/03/2018  and patient was discharged from the hospital 10 days ago on  04/06/2018. The patient now presents for follow up for transition from recent hospitalization.  The day after discharge  our clinical staff contacted the patient to assure stability and schedule a follow up appointment. The discharge summary, medications and diagnostic test results were reviewed before meeting with the patient. The patient was admitted for:   COPD with acute exacerbation ([place]) Essential hypertension Depression, major, in remission ([place]) Parkinson's plus syndrome ([place]) Chronic respiratory failure with hypoxia, on home O2 therapy (HCC) Acute on chronic respiratory failure with hypoxia (HCC)  Chronic diastolic CHF (congestive heart failure) ([place])     This is one of many hospitalizations for this nice 82 yo MWF with severe COPD who sporadically uses her O2 at home. She was admitted with an AECB whereupon, patient became acutely dyspneic and husband as usual contacted EMS for transport to the ER. In route patient was administered iv Solumedrol, po albuterol neb and CPAP and then in the ER repeated duo nebs, iv Mag and BiPAP. Patient & spouse seem to have poor insight to the importance of regular dosing of patients meds & Neb Tx's at home and have a low threshold for calling EMS w/o 1st trying Neb Tx's at home or using her Alprazolam when she becomes very anxious. Patient apparently remained stable in the hospital and after a prudent observation period she was discharged for f/u as an out-patient.         Hospitalization discharge instructions and medications are reconciled with the patient. Husband has poor insight into the importance of administering her respiratory meds on schedule and if patient becomes anxious that he should administer her Oxygen and neb tx's and also give her Alprazolam when she becomes anxious  before he  panics also & calls EMS.     Patient is also followed with Hypertension, Hyperlipidemia, Pre-Diabetes and Vitamin D Deficiency.      Patient is treated for HTN & BP has been controlled at home. Today's BP is stable at 108/42. Patient has had no complaints of any cardiac type chest pain, palpitations, dyspnea/orthopnea/PND, dizziness, claudication, or dependent edema.     Hyperlipidemia is controlled with diet & meds. Patient denies myalgias or other med SE's. Last Lipids were  Lab Results  Component Value Date   CHOL 161 01/26/2018   HDL 51 01/26/2018   LDLCALC 89 01/26/2018   TRIG 117 01/26/2018   CHOLHDL 3.2 01/26/2018      Also, the patient has history of  PreDiabetes and has had no symptoms of reactive hypoglycemia, diabetic polys, paresthesias or visual blurring.  Last A1c was at goal; Lab Results  Component Value Date   HGBA1C 5.4 10/28/2017      Further, the patient also has history of Vitamin D Deficiency and supplements vitamin D without any suspected side-effects. Last vitamin D was   Lab Results  Component Value Date   VD25OH 45 01/26/2018   Current Outpatient Medications on File Prior to Visit  Medication Sig  . acetaminophen (TYLENOL) 325 MG tablet Take 650 mg by mouth every 6 (six) hours as needed.  Marland Kitchen albuterol (PROVENTIL) (2.5 MG/3ML) 0.083% nebulizer solution Use 1 ampule in nebulizer 4 times a day or every 4 hours as needed to rescue asthma.  . ALPRAZolam (XANAX) 0.5 MG tablet Take 1 tablet 3 x /  day for Anxiety  . amitriptyline (ELAVIL) 10 MG tablet Take 10 mg by mouth 3 (three) times daily as needed (depression and mood).  Marland Kitchen aspirin 81 MG tablet Take 81 mg by mouth daily. Buys OTC  . budesonide-formoterol (SYMBICORT) 80-4.5 MCG/ACT inhaler Inhale 2 puffs into the lungs 2 (two) times daily.  . carbidopa-levodopa (SINEMET IR) 25-100 MG tablet Take 2 tablet at 9am and 2 tablets at 3pm. (Patient taking differently: Take 1 and a half  tablet a day.)  . Cholecalciferol  (VITAMIN D) 2000 units CAPS Take 2,000 capsules by mouth daily.  Marland Kitchen ENSURE (ENSURE) Take 237 mLs by mouth daily.  . famotidine (PEPCID) 20 MG tablet Take 20 mg by mouth 2 (two) times daily.   . furosemide (LASIX) 20 MG tablet Take 20 mg by mouth daily.  Marland Kitchen olmesartan (BENICAR) 20 MG tablet Take 20 mg by mouth daily.  Marland Kitchen omeprazole (PRILOSEC) 40 MG capsule Take 40 mg by mouth daily as needed (reflux,).   . OXYGEN Inhale into the lungs. Setting is on 2 when needed  . potassium chloride SA (K-DUR,KLOR-CON) 20 MEQ tablet Take 1 tablet by mouth daily.  . predniSONE (DELTASONE) 10 MG tablet Take 10 mg by mouth daily with breakfast.  . tiotropium (SPIRIVA HANDIHALER) 18 MCG inhalation capsule Place 1 capsule (18 mcg total) into inhaler and inhale daily.  . sertraline (ZOLOFT) 25 MG tablet Take 1 tablet (25 mg total) by mouth daily.   No current facility-administered medications on file prior to visit.    Allergies  Allergen Reactions  . Demerol  [Meperidine Hcl] Other (See Comments)    Pt. Feels like she is suffocating  . Ertapenem Hives    Caused whole body to turn red Other reaction(s): Redness Caused whole body to turn red  . Codeine Other (See Comments)    hallucinations  . Demerol Other (See Comments)    hallucinations  . Morphine And Related Other (See Comments)    hallucinations  . Other Other (See Comments)    Invan 7- pt became red all over  . Sulfate Other (See Comments)    unknown   PMHx:   Past Medical History:  Diagnosis Date  . Balance disorder 2008.     Falls a lot.  She can be standing and then leans too far over to one side   . Bulging disc   . Chronic bronchitis ([place])    due to congestion at times, on prednisone and advair  . COPD (chronic obstructive pulmonary disease) ([place])   . COPD with acute exacerbation ([place]) 12/15/2017  . Depression    many years ago  . GERD (gastroesophageal reflux disease)   . Hearing loss    mild  . Hyperlipidemia   . Hypertension   .  Iatrogenic adrenal insufficiency ([place])   . Incontinence    not indicated at this visit.  Marland Kitchen LVH (left ventricular hypertrophy) due to hypertensive disease    a. 02/2017: echo showing an EF of 65-70% with moderate LVH, hyperdynamic LV with subvalvular gradient of 70 mmHg, SAM, and mild MR  . Neuropathy    legs stay numb   . Osteoarthritis    hands,   . Osteoporosis   . Shortness of breath dyspnea   . Tubulovillous adenoma of rectum   . Wears dentures   . Wears glasses    Immunization History  Administered Date(s) Administered  . Influenza, High Dose Seasonal PF 08/23/2014, 07/24/2015, 08/13/2016, 07/12/2017  . Pneumococcal Conjugate-13 08/23/2014  .  Pneumococcal-Unspecified 07/20/2003, 08/17/2013  . Td 08/17/2013  . Tdap 10/25/2012  . Zoster 03/19/2015   Past Surgical History:  Procedure Laterality Date  . ABDOMINAL HYSTERECTOMY    . APPENDECTOMY    . CHONDROPLASTY  09/19/2014   Procedure: CHONDROPLASTY;  Surgeon: Alta Corning, MD;  Location: [place];  Service: Orthopedics;;  . DILATION AND CURETTAGE OF UTERUS    . EXTERNAL FIXATION LEG  10/25/2012   Procedure: EXTERNAL FIXATION LEG;  Surgeon: Rozanna Box, MD;  Location: ;  Service: Orthopedics;  Laterality: Right;  . EYE SURGERY     bilateral cataract surgery and lens implant  . FINGER SURGERY     fusions and debridements for OA  . INCONTINENCE SURGERY     multiple procedures, not cured  . KNEE ARTHROSCOPY WITH LATERAL MENISECTOMY Right 09/19/2014   Procedure: KNEE ARTHROSCOPY WITH LATERAL MENISECTOMY;  Surgeon: Alta Corning, MD;  Location: [place];  Service: Orthopedics;  Laterality: Right;  . KNEE ARTHROSCOPY WITH MEDIAL MENISECTOMY Right 09/19/2014   Procedure: RIGHT KNEE ARTHROSCOPY WITH MEDIAL AND LATERAL MENISECTOMIES. CHONDROPLASTY OF PATELLA-FEMORAL JOINT;  Surgeon: Alta Corning, MD;  Location: [place];  Service: Orthopedics;  Laterality: Right;  . RECTAL  BIOPSY  09/21/2011   Procedure: BIOPSY RECTAL;  Surgeon: Merrie Roof, MD;  Location: [place];  Service: General;  Laterality: N/A;  3-4 cm  . RECTAL SURGERY     by dr. Marlou Starks, removal of polyp  . TONSILLECTOMY     FHx:    Reviewed / unchanged  SHx:    Reviewed / unchanged  Systems Review:  Constitutional: Denies fever, chills, wt changes, headaches, insomnia, fatigue, night sweats, change in appetite. Eyes: Denies redness, blurred vision, diplopia, discharge, itchy, watery eyes.  ENT: Denies discharge, congestion, post nasal drip, epistaxis, sore throat, earache, hearing loss, dental pain, tinnitus, vertigo, sinus pain, snoring.  CV: Denies chest pain, palpitations, irregular heartbeat, syncope, dyspnea, diaphoresis, orthopnea, PND, claudication or edema. Respiratory: denies cough, dyspnea, DOE, pleurisy, hoarseness, laryngitis, wheezing.  Gastrointestinal: Denies dysphagia, odynophagia, heartburn, reflux, water brash, abdominal pain or cramps, nausea, vomiting, bloating, diarrhea, constipation, hematemesis, melena, hematochezia  or hemorrhoids. Genitourinary: Denies dysuria, frequency, urgency, nocturia, hesitancy, discharge, hematuria or flank pain. Musculoskeletal: Denies arthralgias, myalgias, stiffness, jt. swelling, pain, limping or strain/sprain.  Skin: Denies pruritus, rash, hives, warts, acne, eczema or change in skin lesion(s). Neuro: No weakness, tremor, incoordination, spasms, paresthesia or pain. Psychiatric: Denies confusion, memory loss or sensory loss. Endo: Denies change in weight, skin or hair change.  Heme/Lymph: No excessive bleeding, bruising or enlarged lymph nodes.  Physical Exam  BP (!) 108/42   Pulse 80   Temp (!) 97.3 F (36.3 C)   Resp 16   Ht 5\' 4"  (1.626 m)   Wt 125 lb (56.7 kg)   SpO2 (!) 86%   BMI 21.46 kg/m   Appears well nourished, well groomed  and in no distress.  Eyes: PERRLA, EOMs, conjunctiva no swelling or erythema. Sinuses: No  frontal/maxillary tenderness ENT/Mouth: EAC's clear, TM's nl w/o erythema, bulging. Nares clear w/o erythema, swelling, exudates. Oropharynx clear without erythema or exudates. Oral hygiene is good. Tongue normal, non obstructing. Hearing intact.  Neck: Supple. Thyroid nl. Car 2+/2+ without bruits, nodes or JVD. Chest: Respirations nl with BS clear & equal w/o rales, rhonchi, wheezing or stridor.  Cor: Heart sounds normal w/ regular rate and rhythm without sig. murmurs, gallops, clicks or rubs. Peripheral pulses  normal and equal  without edema.  Abdomen: Soft & bowel sounds normal. Non-tender w/o guarding, rebound, hernias, masses or organomegaly.  Lymphatics: Unremarkable.  Musculoskeletal: Generalized severe diffuse decrease in muscle power, tone & bulk. and normal gait.  Skin: Warm, dry without exposed rashes, lesions or ecchymosis apparent. Gait stabilized with a rollingwalker. Neuro: Cranial nerves intact, reflexes equal bilaterally. Sensory-motor testing grossly intact. Tendon reflexes grossly intact. Sl pill rolling tremor on the Lt. Mild titubation. Pysch: Alert & oriented x 32.  Poor Insight and judgement.   Assessment and Plan:  1. COPD with acute exacerbation ([place])   2. Essential hypertension  - CBC with Differential/Platelet - COMPLETE METABOLIC PANEL WITH GFR  3. Depression, major, in remission ([place])   4. Parkinson's plus syndrome ([place])   5. Chronic respiratory failure with hypoxia, on home O2 therapy ([place])   6. Acute on chronic respiratory failure with hypoxia (HCC)  - CBC with Differential/Platelet - COMPLETE METABOLIC PANEL WITH GFR  7. Chronic diastolic CHF (congestive heart failure) ([place])  8. Medication management  - CBC with Differential/Platelet - COMPLETE METABOLIC PANEL WITH GFR     Long discussion with patient's husband today trying to [facility] him understand when patient becomes anxious and dyspneic to administer her neb Tx's and Alprazolam & not panic  along with her and call EMS.          Discussed  regular exercise, BP monitoring, weight control to achieve/maintain BMI less than 25 and discussed meds and SE's. Recommended labs to assess and monitor clinical status with further disposition pending results of labs. Over 40 minutes of exam, counseling, chart review was performed.

## 2018-04-13 NOTE — Patient Instructions (Addendum)
Chronic Obstructive Pulmonary Disease Exacerbation Chronic obstructive pulmonary disease (COPD) is a long-term (chronic) condition that affects the lungs. COPD is a general term that can be used to describe many different lung problems that cause lung swelling (inflammation) and limit airflow, including chronic bronchitis and emphysema. COPD exacerbations are episodes when breathing symptoms become much worse and require extra treatment. COPD exacerbations are usually caused by infections. Without treatment, COPD exacerbations can be severe and even life threatening. Frequent COPD exacerbations can cause further damage to the lungs. What are the causes? This condition may be caused by:  Respiratory infections, including viral and bacterial infections.  Exposure to smoke.  Exposure to air pollution, chemical fumes, or dust.  Things that give you an allergic reaction (allergens).  Not taking your usual COPD medicines as directed.  Underlying medical problems, such as congestive heart failure or infections not involving the lungs.  In many cases, the cause (trigger) of this condition is not known. What increases the risk? The following factors may make you more likely to develop this condition:  Smoking cigarettes.  Old age.  Frequent prior COPD exacerbations.  What are the signs or symptoms? Symptoms of this condition include:  Increased coughing.  Increased production of mucus from your lungs (sputum).  Increased wheezing.  Increased shortness of breath.  Rapid or labored breathing.  Chest tightness.  Less energy than usual.  Sleep disruption from symptoms.  Confusion or increased sleepiness.  Often these symptoms happen or get worse even with the use of medicines. How is this diagnosed? This condition is diagnosed based on:  Your medical history.  A physical exam.  You may also have tests, including:  A chest X-ray.  Blood tests.  Lung (pulmonary)  function tests.  How is this treated? Treatment for this condition depends on the severity and cause of the symptoms. You may need to be admitted to a hospital for treatment. Some of the treatments commonly used to treat COPD exacerbations are:  Antibiotic medicines. These may be used for severe exacerbations caused by a lung infection, such as pneumonia.  Bronchodilators. These are inhaled medicines that expand the air passages and allow increased airflow.  Steroid medicines. These act to reduce inflammation in the airways. They may be given with an inhaler, taken by mouth, or given through an IV tube inserted into one of your veins.  Supplemental oxygen therapy.  Airway clearing techniques, such as noninvasive ventilation (NIV) and positive expiratory pressure (PEP). These provide respiratory support through a mask or other noninvasive device. An example of this would be using a continuous positive airway pressure (CPAP) machine to improve delivery of oxygen into your lungs.  Follow these instructions at home: Medicines  Take over-the-counter and prescription medicines only as told by your health care provider. It is important to use correct technique with inhaled medicines.  If you were prescribed an antibiotic medicine or oral steroid, take it as told by your health care provider. Do not stop taking the medicine even if you start to feel better. Lifestyle  Eat a healthy diet.  Exercise regularly.  Get plenty of sleep.  Avoid exposure to all substances that irritate the airway, especially to tobacco smoke.  Wash your hands often with soap and water to reduce the risk of infection. If soap and water are not available, use hand sanitizer.  During flu season, avoid enclosed spaces that are crowded with people. General instructions  Drink enough fluid to keep your urine clear or pale yellow (  unless you have a medical condition that requires fluid restriction).  Use a cool mist  vaporizer. This humidifies the air and makes it easier for you to clear your chest when you cough.  If you have a home nebulizer and oxygen, continue to use them as told by your health care provider.  Keep all follow-up visits as told by your health care provider. This is important. How is this prevented?  Stay up-to-date on pneumococcal and influenza (flu) vaccines. A flu shot is recommended every year to help prevent exacerbations.  Do not use any products that contain nicotine or tobacco, such as cigarettes and e-cigarettes. Quitting smoking is very important in preventing COPD from getting worse and in preventing exacerbations from happening as often. If you need help quitting, ask your health care provider.  Follow all instructions for pulmonary rehabilitation after a recent exacerbation. This can help prevent future exacerbations.  Work with your health care provider to develop and follow an action plan. This tells you what steps to take when you experience certain symptoms. Contact a health care provider if:  You have a worsening of your regular COPD symptoms. Get help right away if:  You have worsening shortness of breath, even when resting.  You have trouble talking.  You have severe chest pain.  You cough up blood.  You have a fever.  You have weakness, vomit repeatedly, or faint.  You feel confused.  You are not able to sleep because of your symptoms.  You have trouble doing daily activities. Summary  COPD exacerbations are episodes when breathing symptoms become much worse and require extra treatment above your normal treatment.  Exacerbations can be severe and even life threatening. Frequent COPD exacerbations can cause further damage to your lungs.  COPD exacerbations are usually triggered by infections such as the flu, colds, and even pneumonia.  Treatment for this condition depends on the severity and cause of the symptoms. You may need to be admitted to a  hospital for treatment.  Quitting smoking is very important to prevent COPD from getting worse and to prevent exacerbations from happening as often. This information is not intended to replace advice given to you by your health care provider. Make sure you discuss any questions you have with your health care provider. Document Released: 08/02/2007 Document Revised: 11/09/2016 Document Reviewed: 11/09/2016 Elsevier Interactive Patient Education  2018 [place].  +++++++++++++++++++++++++++++ Recommend Adult Low Dose Aspirin or  coated  Aspirin 81 mg daily  To reduce risk of Colon Cancer 20 %,  Skin Cancer 26 % ,  Melanoma 46%  and  Pancreatic cancer 60% +++++++++++++++++++++++++ Vitamin D goal  is between 70-100.  Please make sure that you are taking your Vitamin D as directed.  It is very important as a natural anti-inflammatory  helping hair, skin, and nails, as well as reducing stroke and heart attack risk.  It helps your bones and helps with mood. It also decreases numerous cancer risks so please take it as directed.  Low Vit D is associated with a 200-300% higher risk for CANCER  and 200-300% higher risk for HEART   ATTACK  &  STROKE.   .....................................Marland Kitchen It is also associated with higher death rate at younger ages,  autoimmune diseases like Rheumatoid arthritis, Lupus, Multiple Sclerosis.    Also many other serious conditions, like depression, Alzheimer's Dementia, infertility, muscle aches, fatigue, fibromyalgia - just to name a few. ++++++++++++++++++++ Recommend the book "The END of DIETING" by Dr Excell Seltzer  &  the book "The END of DIABETES " by Dr Excell Seltzer At [email or web address] - get book & Audio CD's    Being diabetic has a  300% increased risk for heart attack, stroke, cancer, and alzheimer- type vascular dementia. It is very important that you work harder with diet by avoiding all foods that are white. Avoid white rice (brown & wild rice is OK),  white potatoes (sweetpotatoes in moderation is OK), White bread or wheat bread or anything made out of white flour like bagels, donuts, rolls, buns, biscuits, cakes, pastries, cookies, pizza crust, and pasta (made from white flour & egg whites) - vegetarian pasta or spinach or wheat pasta is OK. Multigrain breads like Arnold's or [employer], or multigrain sandwich thins or flatbreads.  Diet, exercise and weight loss can reverse and cure diabetes in the early stages.  Diet, exercise and weight loss is very important in the control and prevention of complications of diabetes which affects every system in your body, ie. Brain - dementia/stroke, eyes - glaucoma/blindness, heart - heart attack/heart failure, kidneys - dialysis, stomach - gastric paralysis, intestines - malabsorption, nerves - severe painful neuritis, circulation - gangrene & loss of a leg(s), and finally cancer and Alzheimers.    I recommend avoid fried & greasy foods,  sweets/candy, white rice (brown or wild rice or Quinoa is OK), white potatoes (sweet potatoes are OK) - anything made from white flour - bagels, doughnuts, rolls, buns, biscuits,white and wheat breads, pizza crust and traditional pasta made of white flour & egg white(vegetarian pasta or spinach or wheat pasta is OK).  Multi-grain bread is OK - like multi-grain flat bread or sandwich thins. Avoid alcohol in excess. Exercise is also important.    Eat all the vegetables you want - avoid meat, especially red meat and dairy - especially cheese.  Cheese is the most concentrated form of trans-fats which is the worst thing to clog up our arteries. Veggie cheese is OK which can be found in the fresh produce section at [employer] or [employer] or [employer]  +++++++++++++++++++++ DASH Eating Plan  DASH stands for "Dietary Approaches to Stop Hypertension."   The DASH eating plan is a healthy eating plan that has been shown to reduce high blood pressure (hypertension). Additional  health benefits may include reducing the risk of type 2 diabetes mellitus, heart disease, and stroke. The DASH eating plan may also help with weight loss. WHAT DO I NEED TO KNOW ABOUT THE DASH EATING PLAN? For the DASH eating plan, you will follow these general guidelines:  Choose foods with a percent daily value for sodium of less than 5% (as listed on the food label).  Use salt-free seasonings or herbs instead of table salt or sea salt.  Check with your health care provider or pharmacist before using salt substitutes.  Eat lower-sodium products, often labeled as "lower sodium" or "no salt added."  Eat fresh foods.  Eat more vegetables, fruits, and low-fat dairy products.  Choose whole grains. Look for the word "whole" as the first word in the ingredient list.  Choose fish   Limit sweets, desserts, sugars, and sugary drinks.  Choose heart-healthy fats.  Eat veggie cheese   Eat more home-cooked food and less restaurant, buffet, and fast food.  Limit fried foods.  Cook foods using methods other than frying.  Limit canned vegetables. If you do use them, rinse them well to decrease the sodium.  When eating at a restaurant, ask that your food be prepared with  less salt, or no salt if possible.                      WHAT FOODS CAN I EAT? Read Dr Fara Olden Fuhrman's books on The End of Dieting & The End of Diabetes  Grains Whole grain or whole wheat bread. Brown rice. Whole grain or whole wheat pasta. Quinoa, bulgur, and whole grain cereals. Low-sodium cereals. Corn or whole wheat flour tortillas. Whole grain cornbread. Whole grain crackers. Low-sodium crackers.  Vegetables Fresh or frozen vegetables (raw, steamed, roasted, or grilled). Low-sodium or reduced-sodium tomato and vegetable juices. Low-sodium or reduced-sodium tomato sauce and paste. Low-sodium or reduced-sodium canned vegetables.   Fruits All fresh, canned (in natural juice), or frozen fruits.  Protein Products  All  fish and seafood.  Dried beans, peas, or lentils. Unsalted nuts and seeds. Unsalted canned beans.  Dairy Low-fat dairy products, such as skim or 1% milk, 2% or reduced-fat cheeses, low-fat ricotta or cottage cheese, or plain low-fat yogurt. Low-sodium or reduced-sodium cheeses.  Fats and Oils Tub margarines without trans fats. Light or reduced-fat mayonnaise and salad dressings (reduced sodium). Avocado. Safflower, olive, or canola oils. Natural peanut or almond butter.  Other Unsalted popcorn and pretzels. The items listed above may not be a complete list of recommended foods or beverages. Contact your dietitian for more options.  +++++++++++++++  WHAT FOODS ARE NOT RECOMMENDED? Grains/ White flour or wheat flour White bread. White pasta. White rice. Refined cornbread. Bagels and croissants. Crackers that contain trans fat.  Vegetables  Creamed or fried vegetables. Vegetables in a . Regular canned vegetables. Regular canned tomato sauce and paste. Regular tomato and vegetable juices.  Fruits Dried fruits. Canned fruit in light or heavy syrup. Fruit juice.  Meat and Other Protein Products Meat in general - RED meat & White meat.  Fatty cuts of meat. Ribs, chicken wings, all processed meats as bacon, sausage, bologna, salami, fatback, hot dogs, bratwurst and packaged luncheon meats.  Dairy Whole or 2% milk, cream, half-and-half, and cream cheese. Whole-fat or sweetened yogurt. Full-fat cheeses or blue cheese. Non-dairy creamers and whipped toppings. Processed cheese, cheese spreads, or cheese curds.  Condiments Onion and garlic salt, seasoned salt, table salt, and sea salt. Canned and packaged gravies. Worcestershire sauce. Tartar sauce. Barbecue sauce. Teriyaki sauce. Soy sauce, including reduced sodium. Steak sauce. Fish sauce. Oyster sauce. Cocktail sauce. Horseradish. Ketchup and mustard. Meat flavorings and tenderizers. Bouillon cubes. Hot sauce. Tabasco sauce. Marinades. Taco  seasonings. Relishes.  Fats and Oils Butter, stick margarine, lard, shortening and bacon fat. Coconut, palm kernel, or palm oils. Regular salad dressings.  Pickles and olives. Salted popcorn and pretzels.  The items listed above may not be a complete list of foods and beverages to avoid.    Chronic Obstructive Pulmonary Disease Chronic obstructive pulmonary disease (COPD) is a long-term (chronic) condition that affects the lungs. COPD is a general term that can be used to describe many different lung problems that cause lung swelling (inflammation) and limit airflow, including chronic bronchitis and emphysema. If you have COPD, your lung function will probably never return to normal. In most cases, it gets worse over time. However, there are steps you can take to slow the progression of the disease and improve your quality of life. What are the causes? This condition may be caused by:  Smoking. This is the most common cause.  Certain genes passed down through families.  What increases the risk? The following factors may  make you more likely to develop this condition:  Secondhand smoke from cigarettes, pipes, or cigars.  Exposure to chemicals and other irritants such as fumes and dust in the work environment.  Chronic lung conditions or infections.  What are the signs or symptoms? Symptoms of this condition include:  Shortness of breath, especially during physical activity.  Chronic cough with a large amount of thick mucus. Sometimes the cough may not have any mucus (dry cough).  Wheezing.  Rapid breaths.  Gray or bluish discoloration (cyanosis) of the skin, especially in your fingers, toes, or lips.  Feeling tired (fatigue).  Weight loss.  Chest tightness.  Frequent infections.  Episodes when breathing symptoms become much worse (exacerbations).  Swelling in the ankles, feet, or legs. This may occur in later stages of the disease.  How is this diagnosed? This  condition is diagnosed based on:  Your medical history.  A physical exam.  You may also have tests, including:  Lung (pulmonary) function tests. This may include a spirometry test, which measures your ability to exhale properly.  Chest X-ray.  CT scan.  Blood tests.  How is this treated? This condition may be treated with:  Medicines. These may include inhaled rescue medicines to treat acute exacerbations as well as long-term, or maintenance, medicines to prevent flare-ups of COPD. ? Bronchodilators help treat COPD by dilating the airways to allow increased airflow and make your breathing more comfortable. ? Steroids can reduce airway inflammation and help prevent exacerbations.  Smoking cessation. If you smoke, your health care provider may ask you to quit, and may also recommend therapy or replacement products to help you quit.  Pulmonary rehabilitation. This may involve working with a team of health care providers and specialists, such as respiratory, occupational, and physical therapists.  Exercise and physical activity. These are beneficial for nearly all people with COPD.  Nutrition therapy to gain weight, if you are underweight.  Oxygen. Supplemental oxygen therapy is only helpful if you have a low oxygen level in your blood (hypoxemia).  Lung surgery or transplant.  Palliative care. This is to help people with COPD feel comfortable when treatment is no longer working.  Follow these instructions at home: Medicines  Take over-the-counter and prescription medicines (inhaled or pills) only as told by your health care provider.  Talk to your health care provider before taking any cough or allergy medicines. You may need to avoid certain medicines that dry out your airways. Lifestyle  If you are a smoker, the most important thing that you can do is to stop smoking. Do not use any products that contain nicotine or tobacco, such as cigarettes and e-cigarettes. If you need  help quitting, ask your health care provider. Continuing to smoke will cause the disease to progress faster.  Avoid exposure to things that irritate your lungs, such as smoke, chemicals, and fumes.  Stay active, but balance activity with periods of rest. Exercise and physical activity will help you maintain your ability to do things you want to do.  Learn and use relaxation techniques to manage stress and to control your breathing.  Get the right amount of sleep and get quality sleep. Most adults need 7 or more hours per night.  Eat healthy foods. Eating smaller, more frequent meals and resting before meals may help you maintain your strength. Controlled breathing Learn and use controlled breathing techniques as directed by your health care provider. Controlled breathing techniques include:  Pursed lip breathing. Start by breathing in (inhaling)  through your nose for 1 second. Then, purse your lips as if you were going to whistle and breathe out (exhale) through the pursed lips for 2 seconds.  Diaphragmatic breathing. Start by putting one hand on your abdomen just above your waist. Inhale slowly through your nose. The hand on your abdomen should move out. Then purse your lips and exhale slowly. You should be able to feel the hand on your abdomen moving in as you exhale.  Controlled coughing Learn and use controlled coughing to clear mucus from your lungs. Controlled coughing is a series of short, progressive coughs. The steps of controlled coughing are: 1. Lean your head slightly forward. 2. Breathe in deeply using diaphragmatic breathing. 3. Try to hold your breath for 3 seconds. 4. Keep your mouth slightly open while coughing twice. 5. Spit any mucus out into a tissue. 6. Rest and repeat the steps once or twice as needed.  General instructions  Make sure you receive all the vaccines that your health care provider recommends, especially the pneumococcal and influenza vaccines.  Preventing infection and hospitalization is very important when you have COPD.  Use oxygen therapy and pulmonary rehabilitation if directed to by your health care provider. If you require home oxygen therapy, ask your health care provider whether you should purchase a pulse oximeter to measure your oxygen level at home.  Work with your health care provider to develop a COPD action plan. This will help you know what steps to take if your condition gets worse.  Keep other chronic health conditions under control as told by your health care provider.  Avoid extreme temperature and humidity changes.  Avoid contact with people who have an illness that spreads from person to person (is contagious), such as viral infections or pneumonia.  Keep all follow-up visits as told by your health care provider. This is important. Contact a health care provider if:  You are coughing up more mucus than usual.  There is a change in the color or thickness of your mucus.  Your breathing is more labored than usual.  Your breathing is faster than usual.  You have difficulty sleeping.  You need to use your rescue medicines or inhalers more often than expected.  You have trouble doing routine activities such as getting dressed or walking around the house. Get help right away if:  You have shortness of breath while you are resting.  You have shortness of breath that prevents you from: ? Being able to talk. ? Performing your usual physical activities.  You have chest pain lasting longer than 5 minutes.  Your skin color is more blue (cyanotic) than usual.  You measure low oxygen saturations for longer than 5 minutes with a pulse oximeter.  You have a fever.  You feel too tired to breathe normally. Summary  Chronic obstructive pulmonary disease (COPD) is a long-term (chronic) condition that affects the lungs.  Your lung function will probably never return to normal. In most cases, it gets worse  over time. However, there are steps you can take to slow the progression of the disease and improve your quality of life.  Treatment for COPD may include taking medicines, quitting smoking, pulmonary rehabilitation, and changes to diet and exercise. As the disease progresses, you may need oxygen therapy, a lung transplant, or palliative care.  To help manage your condition, do not smoke, avoid exposure to things that irritate your lungs, stay up to date on all vaccines, and follow your health care provider's  instructions for taking medicines. ====================================  Chronic Respiratory Failure Respiratory failure is a condition in which the lungs do not work well and the breathing (respiratory) system fails. When respiratory failure occurs, it becomes difficult for the lungs to get enough oxygen or to eliminate carbon dioxide or to do both duties. If the lungs do not work properly, the heart, brain, and other body systems do not get enough oxygen. Respiratory failure is life-threatening if it is not treated. Respiratory failure can be acute or chronic. Acute respiratory failure is sudden and severe and requires emergency medical treatment. Chronic respiratory failure happens over time, usually due to a medical condition that gets worse. What are the causes? This condition may be caused by any problem that affects the heart or lungs. Causes include:  Chronic bronchitis and emphysema (COPD).  Pulmonary fibrosis.  Water in the lungs due to heart failure, lung injury, or infection (pulmonary edema).  Asthma.  Nerve or muscle diseases that make chest movements difficult, such as Leta Baptist disease or Guillain-Barre syndrome.  A collapsed lung (pneumothorax).  Pulmonary hypertension.  Chronic sleep apnea.  Pneumonia.  Obesity.  A blood clot in a lung (pulmonary embolism).  Trauma to the chest that makes breathing difficult.  What increases the risk? You are more likely  to develop this condition if:  You are a smoker, or have a history of smoking.  You have a weak immune system.  You have a family history of breathing problems or lung disease.  You have a long term lung disease such as COPD.  What are the signs or symptoms? Symptoms of this condition include:  Shortness of breath with or without activity.  Difficulty breathing.  Wheezing.  A fast or irregular heartbeat (arrhythmia).  Chest pain or tightness.  A bluish color to the fingernail or toenail beds (cyanosis).  Confusion.  Drowsiness.  Extreme fatigue, especially with minimal activity.  How is this diagnosed? This condition may be diagnosed based on:  Your medical history.  A physical exam.  Other tests, such as: ? A chest X-ray. ? A CT scan of your lungs. ? Blood tests, such as an arterial blood gas test. This test is done to check if you have enough oxygen in your blood. ? An electrocardiogram. This test records the electrical activity of your heart. ? An echocardiogram. This test uses sound waves to produce an image of your heart.  A check of your blood pressure, heart rate, breathing rate, and blood oxygen level.  How is this treated? Treatment for this condition depends on the cause. Treatment can include the following:  Getting oxygen through a nasal cannula. This is a tube that goes in your nose.  Getting oxygen through a face mask.  Receiving noninvasive positive pressure ventilation. This is a method of breathing support in which a machine blows air into your lungs through a mask. The machine allows you to breathe on your own. It helps the body take in oxygen and eliminate carbon dioxide.  Using a ventilator. This is a breathing machine that delivers oxygen to the lungs through a breathing tube that is put into the trachea. This machine is used when you can no longer breathe well enough on your own.  Medicines to help with breathing, such as: ? Medicines  that open up and relax air passages, such as bronchodilators. These may be given through a device that turns liquid medicines into a mist you can breathe in (nebulizer). These medicines help with breathing. ?  Diuretics. These medicines get rid of extra fluid out of your lungs, which can help you breathe better. ? Steroid medicines. These decrease inflammation in the lungs. ? Antibiotic medicines. These may be given to treat a bacterial infection, such as pneumonia.  Pulmonary rehabilitation. This is an exercise program that strengthens the muscles in your chest and helps you learn breathing techniques in order to manage your condition.  Follow these instructions at home: Medicines  Take over-the-counter and prescription medicines only as told by your health care provider.  If you were prescribed an antibiotic medicine, take it as told by your health care provider. Do not stop taking the antibiotic even if you start to feel better. General instructions  Use oxygen therapy and pulmonary rehabilitation if directed to by your health care provider. If you require home oxygen therapy, ask your health care provider whether you should purchase a pulse oximeter to measure your oxygen level at home.  Work with your health care provider to create a plan to help you deal with your condition. Follow this plan.  Do not use any products that contain nicotine or tobacco, such as cigarettes and e-cigarettes. If you need help quitting, ask your health care provider.  Avoid exposure to irritants that make your breathing problems worse. These include smoke, chemicals, and fumes.  Stay active, but balance activity with periods of rest. Exercise and physical activity will help you maintain your ability to do things you want to do.  Stay up to date on all vaccines, especially yearly influenza and pneumonia vaccines.  Avoid people who are sick as well as crowded places during the flu season.  Keep all follow-up  visits as told by your health care provider. This is important. Contact a health care provider if:  Your shortness of breath gets worse and you cannot do the things you used to do.  You have increased mucus (sputum), wheezing, coughing, or loss of energy.  You are on oxygen therapy and you are starting to need more.  You need to use your medicines more often.  You have a fever. Get help right away if:  Your shortness of breath becomes worse.  You are unable to say more than a few words without having to catch your breath.  You develop chest pain or tightness. Summary  Respiratory failure is a condition in which the lungs do not work well and the breathing system fails.  This condition can be very serious and is often life-threatening.  This condition is diagnosed with tests and can be treated with medicines or oxygen.  Contact a health care provider if your shortness of breath gets worse or if you need to use your oxygen or medicines more often than before. This information is not intended to replace advice given to you by your health care provider. Make sure you discuss any questions you have with your health care provider. Document Released: 10/05/2005 Document Revised: 10/16/2016 Document Reviewed: 10/16/2016 Elsevier Interactive Patient Education  2017 [employer].

## 2018-04-14 DIAGNOSIS — G629 Polyneuropathy, unspecified: Secondary | ICD-10-CM | POA: Diagnosis not present

## 2018-04-14 DIAGNOSIS — J9621 Acute and chronic respiratory failure with hypoxia: Secondary | ICD-10-CM | POA: Diagnosis not present

## 2018-04-14 DIAGNOSIS — I5032 Chronic diastolic (congestive) heart failure: Secondary | ICD-10-CM | POA: Diagnosis not present

## 2018-04-14 DIAGNOSIS — J441 Chronic obstructive pulmonary disease with (acute) exacerbation: Secondary | ICD-10-CM | POA: Diagnosis not present

## 2018-04-14 DIAGNOSIS — G20 Parkinson's disease: Secondary | ICD-10-CM | POA: Diagnosis not present

## 2018-04-14 DIAGNOSIS — I110 Hypertensive heart disease with heart failure: Secondary | ICD-10-CM | POA: Diagnosis not present

## 2018-04-16 ENCOUNTER — Other Ambulatory Visit: Payer: Self-pay | Admitting: Internal Medicine

## 2018-04-16 MED ORDER — PREDNISONE 10 MG PO TABS: 10.0000 mg | ORAL_TABLET | Freq: Every day | ORAL | 1 refills | Status: DC

## 2018-04-18 DIAGNOSIS — H02102 Unspecified ectropion of right lower eyelid: Secondary | ICD-10-CM | POA: Diagnosis not present

## 2018-04-20 DIAGNOSIS — G20 Parkinson's disease: Secondary | ICD-10-CM | POA: Diagnosis not present

## 2018-04-20 DIAGNOSIS — I110 Hypertensive heart disease with heart failure: Secondary | ICD-10-CM | POA: Diagnosis not present

## 2018-04-20 DIAGNOSIS — J9621 Acute and chronic respiratory failure with hypoxia: Secondary | ICD-10-CM | POA: Diagnosis not present

## 2018-04-20 DIAGNOSIS — J441 Chronic obstructive pulmonary disease with (acute) exacerbation: Secondary | ICD-10-CM | POA: Diagnosis not present

## 2018-04-20 DIAGNOSIS — I5032 Chronic diastolic (congestive) heart failure: Secondary | ICD-10-CM | POA: Diagnosis not present

## 2018-04-20 DIAGNOSIS — G629 Polyneuropathy, unspecified: Secondary | ICD-10-CM | POA: Diagnosis not present

## 2018-04-22 DIAGNOSIS — I5032 Chronic diastolic (congestive) heart failure: Secondary | ICD-10-CM | POA: Diagnosis not present

## 2018-04-22 DIAGNOSIS — G20 Parkinson's disease: Secondary | ICD-10-CM | POA: Diagnosis not present

## 2018-04-22 DIAGNOSIS — I110 Hypertensive heart disease with heart failure: Secondary | ICD-10-CM | POA: Diagnosis not present

## 2018-04-22 DIAGNOSIS — G629 Polyneuropathy, unspecified: Secondary | ICD-10-CM | POA: Diagnosis not present

## 2018-04-22 DIAGNOSIS — J441 Chronic obstructive pulmonary disease with (acute) exacerbation: Secondary | ICD-10-CM | POA: Diagnosis not present

## 2018-04-22 DIAGNOSIS — J9621 Acute and chronic respiratory failure with hypoxia: Secondary | ICD-10-CM | POA: Diagnosis not present

## 2018-04-25 DIAGNOSIS — J9611 Chronic respiratory failure with hypoxia: Secondary | ICD-10-CM | POA: Diagnosis not present

## 2018-04-25 DIAGNOSIS — I10 Essential (primary) hypertension: Secondary | ICD-10-CM | POA: Diagnosis not present

## 2018-04-25 DIAGNOSIS — J449 Chronic obstructive pulmonary disease, unspecified: Secondary | ICD-10-CM | POA: Diagnosis not present

## 2018-04-25 DIAGNOSIS — I5032 Chronic diastolic (congestive) heart failure: Secondary | ICD-10-CM | POA: Diagnosis not present

## 2018-04-26 DIAGNOSIS — G629 Polyneuropathy, unspecified: Secondary | ICD-10-CM | POA: Diagnosis not present

## 2018-04-26 DIAGNOSIS — I5032 Chronic diastolic (congestive) heart failure: Secondary | ICD-10-CM | POA: Diagnosis not present

## 2018-04-26 DIAGNOSIS — G20 Parkinson's disease: Secondary | ICD-10-CM | POA: Diagnosis not present

## 2018-04-26 DIAGNOSIS — I110 Hypertensive heart disease with heart failure: Secondary | ICD-10-CM | POA: Diagnosis not present

## 2018-04-26 DIAGNOSIS — J441 Chronic obstructive pulmonary disease with (acute) exacerbation: Secondary | ICD-10-CM | POA: Diagnosis not present

## 2018-04-26 DIAGNOSIS — J9621 Acute and chronic respiratory failure with hypoxia: Secondary | ICD-10-CM | POA: Diagnosis not present

## 2018-04-28 DIAGNOSIS — J9621 Acute and chronic respiratory failure with hypoxia: Secondary | ICD-10-CM | POA: Diagnosis not present

## 2018-04-28 DIAGNOSIS — I5032 Chronic diastolic (congestive) heart failure: Secondary | ICD-10-CM | POA: Diagnosis not present

## 2018-04-28 DIAGNOSIS — G629 Polyneuropathy, unspecified: Secondary | ICD-10-CM | POA: Diagnosis not present

## 2018-04-28 DIAGNOSIS — G20 Parkinson's disease: Secondary | ICD-10-CM | POA: Diagnosis not present

## 2018-04-28 DIAGNOSIS — J441 Chronic obstructive pulmonary disease with (acute) exacerbation: Secondary | ICD-10-CM | POA: Diagnosis not present

## 2018-04-28 DIAGNOSIS — I110 Hypertensive heart disease with heart failure: Secondary | ICD-10-CM | POA: Diagnosis not present

## 2018-05-12 ENCOUNTER — Ambulatory Visit (INDEPENDENT_AMBULATORY_CARE_PROVIDER_SITE_OTHER): Payer: Medicare Other | Admitting: Internal Medicine

## 2018-05-12 ENCOUNTER — Encounter: Payer: Self-pay | Admitting: Internal Medicine

## 2018-05-12 VITALS — BP 132/76 | HR 60 | Temp 97.3°F | Resp 18 | Ht 64.0 in | Wt 133.2 lb

## 2018-05-12 DIAGNOSIS — I700 Atherosclerosis of aorta: Secondary | ICD-10-CM | POA: Diagnosis not present

## 2018-05-12 DIAGNOSIS — G232 Striatonigral degeneration: Secondary | ICD-10-CM | POA: Diagnosis not present

## 2018-05-12 DIAGNOSIS — R7303 Prediabetes: Secondary | ICD-10-CM | POA: Diagnosis not present

## 2018-05-12 DIAGNOSIS — Z79899 Other long term (current) drug therapy: Secondary | ICD-10-CM

## 2018-05-12 DIAGNOSIS — I5032 Chronic diastolic (congestive) heart failure: Secondary | ICD-10-CM

## 2018-05-12 DIAGNOSIS — I10 Essential (primary) hypertension: Secondary | ICD-10-CM

## 2018-05-12 DIAGNOSIS — Z136 Encounter for screening for cardiovascular disorders: Secondary | ICD-10-CM

## 2018-05-12 DIAGNOSIS — Z8249 Family history of ischemic heart disease and other diseases of the circulatory system: Secondary | ICD-10-CM | POA: Diagnosis not present

## 2018-05-12 DIAGNOSIS — G609 Hereditary and idiopathic neuropathy, unspecified: Secondary | ICD-10-CM | POA: Diagnosis not present

## 2018-05-12 DIAGNOSIS — E782 Mixed hyperlipidemia: Secondary | ICD-10-CM

## 2018-05-12 DIAGNOSIS — E559 Vitamin D deficiency, unspecified: Secondary | ICD-10-CM

## 2018-05-12 DIAGNOSIS — Z1211 Encounter for screening for malignant neoplasm of colon: Secondary | ICD-10-CM

## 2018-05-12 DIAGNOSIS — J984 Other disorders of lung: Secondary | ICD-10-CM | POA: Diagnosis not present

## 2018-05-12 DIAGNOSIS — Z1212 Encounter for screening for malignant neoplasm of rectum: Secondary | ICD-10-CM

## 2018-05-12 DIAGNOSIS — R7309 Other abnormal glucose: Secondary | ICD-10-CM | POA: Diagnosis not present

## 2018-05-12 NOTE — Patient Instructions (Signed)

## 2018-05-12 NOTE — Progress Notes (Signed)
[facility] [name], M.D.     [name]. Silverio Lay, P.A.-C Liane Comber, [place] [address] [place], [place] [address] Telephone [phone] Telefax [phone]   Comprehensive Evaluation &  Examination     This very nice 82 y.o. MWF presents for a  comprehensive evaluation and management of multiple medical co-morbidities.  Patient has been followed for HTN, HLD, Restrictive Lung Disease, Hx/o Asthma, T2_NIDDM  Prediabetes  and Vitamin D Deficiency. 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 Patient also is on Home O2 for her Restrictive Lung Disease.       HTN predates circa 1996. Patient's BP has been controlled at home and patient denies any cardiac symptoms as chest pain, palpitations, shortness of breath, dizziness or ankle swelling. Today's BP was initially sl elevated & rechecked at goal - 132/76.      Patient's hyperlipidemia is controlled with diet and medications. Patient denies myalgias or other medication SE's. Last lipids were at goal: Lab Results  Component Value Date   CHOL 173 05/12/2018   HDL 75 05/12/2018   LDLCALC 77 05/12/2018   TRIG 133 05/12/2018   CHOLHDL 2.3 05/12/2018      Patient has prediabetes (A1c 6.0% in 2011)  and patient denies reactive hypoglycemic symptoms, visual blurring, diabetic polys, or paresthesias. Last A1c was not at goal: Lab Results  Component Value Date   HGBA1C 6.1 (H) 05/12/2018      Finally, patient has history of Vitamin D Deficiency ("10" in 2008) and last Vitamin D was still low: Lab Results  Component Value Date   VD25OH 35 05/12/2018   Current Outpatient Medications on File Prior to Visit  Medication Sig  . acetaminophen (TYLENOL) 325 MG tablet Take 650 mg by mouth every 6 (six) hours as  needed.  Marland Kitchen albuterol (PROVENTIL) (2.5 MG/3ML) 0.083% nebulizer solution Use 1 ampule in nebulizer 4 times a day or every 4 hours as needed to rescue asthma.  . ALPRAZolam (XANAX) 0.5 MG tablet Take 1 tablet 3 x / day for Anxiety  . amitriptyline (ELAVIL) 10 MG tablet Take 10 mg by mouth 3 (three) times daily as needed (depression and mood).  Marland Kitchen aspirin 81 MG tablet Take 81 mg by mouth daily. Buys OTC  . budesonide-formoterol (SYMBICORT) 80-4.5 MCG/ACT inhaler Inhale 2 puffs into the lungs 2 (two) times daily.  . Cholecalciferol (VITAMIN D) 2000 units CAPS Take 2,000 capsules by mouth daily.  Marland Kitchen ENSURE (ENSURE) Take 237 mLs by mouth daily.  . famotidine (PEPCID) 20 MG tablet Take 20 mg by mouth 2 (two) times daily.   . furosemide (LASIX) 20 MG tablet Take 20 mg by mouth daily.  Marland Kitchen olmesartan (BENICAR) 20 MG tablet Take 20 mg by mouth daily.  Marland Kitchen omeprazole (PRILOSEC) 40 MG capsule Take 40 mg by mouth daily as needed (reflux,).   . OXYGEN Inhale into the lungs. Setting is on 2 when needed  . potassium chloride SA (K-DUR,KLOR-CON) 20 MEQ tablet Take 1 tablet by mouth daily.  . predniSONE (DELTASONE) 10 MG tablet Take 1 tablet (10 mg total) by mouth daily with breakfast. Take 1 tablet 3 x/day or as directed  . carbidopa-levodopa (SINEMET IR) 25-100 MG tablet Take 2 tablet at 9am and 2 tablets at 3pm. (Patient not taking: Reported on 05/12/2018)  . sertraline (ZOLOFT) 25 MG  tablet Take 1 tablet (25 mg total) by mouth daily. (Patient not taking: Reported on 05/12/2018)  . tiotropium (SPIRIVA HANDIHALER) 18 MCG inhalation capsule Place 1 capsule (18 mcg total) into inhaler and inhale daily.   No current facility-administered medications on file prior to visit.    Allergies  Allergen Reactions  . Demerol  [Meperidine Hcl] Other (See Comments)    Pt. Feels like she is suffocating  . Ertapenem Hives    Caused whole body to turn red Other reaction(s): Redness Caused whole body to turn red  . Codeine Other  (See Comments)    hallucinations  . Demerol Other (See Comments)    hallucinations  . Morphine And Related Other (See Comments)    hallucinations  . Other Other (See Comments)    Invan 7- pt became red all over  . Sulfate Other (See Comments)    unknown   Past Medical History:  Diagnosis Date  . Balance disorder 2008.     Falls a lot.  She can be standing and then leans too far over to one side   . Bulging disc   . Chronic bronchitis ([place])    due to congestion at times, on prednisone and advair  . COPD (chronic obstructive pulmonary disease) ([place] [facility])   . COPD with acute exacerbation ([facility]) 12/15/2017  . Depression    many years ago  . GERD (gastroesophageal reflux disease)   . Hearing loss    mild  . Hyperlipidemia   . Hypertension   . Iatrogenic adrenal insufficiency ([facility])   . Incontinence    not indicated at this visit.  Marland Kitchen LVH (left ventricular hypertrophy) due to hypertensive disease    a. 02/2017: echo showing an EF of 65-70% with moderate LVH, hyperdynamic LV with subvalvular gradient of 70 mmHg, SAM, and mild MR  . Neuropathy    legs stay numb   . Osteoarthritis    hands,   . Osteoporosis   . Shortness of breath dyspnea   . Tubulovillous adenoma of rectum   . Wears dentures   . Wears glasses    Health Maintenance  Topic Date Due  . INFLUENZA VACCINE  05/19/2018  . TETANUS/TDAP  08/18/2023  . DEXA SCAN  Completed  . PNA vac Low Risk Adult  Completed   Immunization History  Administered Date(s) Administered  . Influenza, High Dose Seasonal PF 08/23/2014, 07/24/2015, 08/13/2016, 07/12/2017  . Pneumococcal Conjugate-13 08/23/2014  . Pneumococcal-Unspecified 07/20/2003, 08/17/2013  . Td 08/17/2013  . Tdap 10/25/2012  . Zoster 03/19/2015   Last Colon -  Last MGM -  Past Surgical History:  Procedure Laterality Date  . ABDOMINAL HYSTERECTOMY    . APPENDECTOMY    . CHONDROPLASTY  09/19/2014   Procedure: CHONDROPLASTY;  Surgeon: Alta Corning, MD;  Location:  [facility];  Service: Orthopedics;;  . DILATION AND CURETTAGE OF UTERUS    . EXTERNAL FIXATION LEG  10/25/2012   Procedure: EXTERNAL FIXATION LEG;  Surgeon: Rozanna Box, MD;  Location: [facility];  Service: Orthopedics;  Laterality: Right;  . EYE SURGERY     bilateral cataract surgery and lens implant  . FINGER SURGERY     fusions and debridements for OA  . INCONTINENCE SURGERY     multiple procedures, not cured  . KNEE ARTHROSCOPY WITH LATERAL MENISECTOMY Right 09/19/2014   Procedure: KNEE ARTHROSCOPY WITH LATERAL MENISECTOMY;  Surgeon: Alta Corning, MD;  Location: [facility];  Service: Orthopedics;  Laterality: Right;  .  KNEE ARTHROSCOPY WITH MEDIAL MENISECTOMY Right 09/19/2014   Procedure: RIGHT KNEE ARTHROSCOPY WITH MEDIAL AND LATERAL MENISECTOMIES. CHONDROPLASTY OF PATELLA-FEMORAL JOINT;  Surgeon: Alta Corning, MD;  Location: [facility];  Service: Orthopedics;  Laterality: Right;  . RECTAL BIOPSY  09/21/2011   Procedure: BIOPSY RECTAL;  Surgeon: Merrie Roof, MD;  Location: [facility];  Service: General;  Laterality: N/A;  3-4 cm  . RECTAL SURGERY     by dr. Marlou Starks, removal of polyp  . TONSILLECTOMY     Family History  Problem Relation Age of Onset  . Cancer Sister        pt unaware of what kind  . High blood pressure Mother   . COPD Mother   . Heart attack Father    Social History   Tobacco Use  . Smoking status: Never Smoker  . Smokeless tobacco: Never Used  Substance Use Topics  . Alcohol use: No  . Drug use: No    ROS Constitutional: Denies fever, chills, weight loss/gain, headaches, insomnia,  night sweats, and change in appetite. Does c/o fatigue. Eyes: Denies redness, blurred vision, diplopia, discharge, itchy, watery eyes.  ENT: Denies discharge, congestion, post nasal drip, epistaxis, sore throat, earache, hearing loss, dental pain, Tinnitus, Vertigo, Sinus pain, snoring.  Cardio: Denies chest pain, palpitations, irregular  heartbeat, syncope, dyspnea, diaphoresis, orthopnea, PND, claudication, edema Respiratory: denies cough, dyspnea, DOE, pleurisy, hoarseness, laryngitis, wheezing.  Gastrointestinal: Denies dysphagia, heartburn, reflux, water brash, pain, cramps, nausea, vomiting, bloating, diarrhea, constipation, hematemesis, melena, hematochezia, jaundice, hemorrhoids Genitourinary: Denies dysuria, frequency, urgency, nocturia, hesitancy, discharge, hematuria, flank pain Breast: Breast lumps, nipple discharge, bleeding.  Musculoskeletal: Denies arthralgia, myalgia, stiffness, Jt. Swelling, pain, limp, and strain/sprain. Denies falls. Skin: Denies puritis, rash, hives, warts, acne, eczema, changing in skin lesion Neuro: No weakness, tremor, incoordination, spasms, paresthesia, pain Psychiatric: Denies confusion, memory loss, sensory loss. Denies Depression. Endocrine: Denies change in weight, skin, hair change, nocturia, and paresthesia, diabetic polys, visual blurring, hyper / hypo glycemic episodes.  Heme/Lymph: No excessive bleeding, bruising, enlarged lymph nodes.  Physical Exam  BP 132/76   Pulse 60   Temp (!) 97.3 F (36.3 C)   Resp 18   Ht 5\' 4"  (1.626 m)   Wt 133 lb 3.2 oz (60.4 kg)   SpO2 93%   BMI 22.86 kg/m   General Appearance: Well nourished, well groomed and in no apparent distress.  Eyes: PERRLA, EOMs, conjunctiva no swelling or erythema, normal fundi and vessels. Sinuses: No frontal/maxillary tenderness ENT/Mouth: EACs patent / TMs  nl. Nares clear without erythema, swelling, mucoid exudates. Oral hygiene is good. No erythema, swelling, or exudate. Tongue normal, non-obstructing. Tonsils not swollen or erythematous. Hearing normal.  Neck: Supple, thyroid not palpable. No bruits, nodes or JVD. Respiratory: Respiratory effort normal.  BS equal and clear bilateral without rales, rhonci, wheezing or stridor. Cardio: Heart sounds are normal with regular rate and rhythm and no murmurs, rubs  or gallops. Peripheral pulses are normal and equal bilaterally without edema. No aortic or femoral bruits. Chest: symmetric with normal excursions and percussion. Breasts: Symmetric, without lumps, nipple discharge, retractions, or fibrocystic changes.  Abdomen: Flat, soft with bowel sounds active. Nontender, no guarding, rebound, hernias, masses, or organomegaly.  Lymphatics: Non tender without lymphadenopathy.  Musculoskeletal: Full ROM with generalized moderate loss of muscle mass and stooped broad based gait. Skin: Warm and dry without rashes, lesions, cyanosis, clubbing or  ecchymosis.  Neuro:  Sl dysarthric slurred speech.Cranial nerves intact, reflexes  equal bilaterally. Mild chin titubation. sl high frequency, low amplitude tremor of the Lt>Rt hand. Sensation sl decreased in the distal LE's.   Pysch: Alert and oriented X 3. Flat affect. Insight and Judgment poor.   Assessment and Plan  1. Essential hypertension  - EKG 12-Lead - Urinalysis, Routine w reflex microscopic - Microalbumin / creatinine urine ratio - CBC with Differential/Platelet - COMPLETE METABOLIC PANEL WITH GFR - Magnesium - TSH  2. Hyperlipidemia, mixed  - EKG 12-Lead - TSH  3. Abnormal glucose  - EKG 12-Lead - Hemoglobin A1c - Insulin, random  4. Vitamin D deficiency  - VITAMIN D 25 Hydroxyl  5. Prediabetes  - EKG 12-Lead - Hemoglobin A1c - Insulin, random  6. Chronic diastolic CHF (congestive heart failure) (H[facility])  - EKG 12-Lead  7. Parkinson's plus syndrome ([place])   8. Restrictive lung disease   9. Hereditary and idiopathic peripheral neuropathy   10. Aortic atherosclerosis ([facility])  - EKG 12-Lead  11. Screening for ischemic heart disease  - EKG 12-Lead  12. Medication management  - Urinalysis, Routine w reflex microscopic - Microalbumin / creatinine urine ratio - CBC with Differential/Platelet - COMPLETE METABOLIC PANEL WITH GFR - Magnesium - Lipid panel - TSH - Hemoglobin  A1c - Insulin, random - VITAMIN D 25 Hydroxyl  13. Screening for colorectal cancer  - POC Hemoccult Bld/Stl         Patient was counseled in prudent diet to achieve/maintain BMI less than 25 for weight control, BP monitoring, regular exercise and medications. Discussed med's effects and SE's. Screening labs and tests as requested with regular follow-up as recommended. Over 40 minutes of exam, counseling, chart review and high complex critical decision making was performed.

## 2018-05-13 DIAGNOSIS — G629 Polyneuropathy, unspecified: Secondary | ICD-10-CM | POA: Diagnosis not present

## 2018-05-13 DIAGNOSIS — J9621 Acute and chronic respiratory failure with hypoxia: Secondary | ICD-10-CM | POA: Diagnosis not present

## 2018-05-13 DIAGNOSIS — G20 Parkinson's disease: Secondary | ICD-10-CM | POA: Diagnosis not present

## 2018-05-13 DIAGNOSIS — I110 Hypertensive heart disease with heart failure: Secondary | ICD-10-CM | POA: Diagnosis not present

## 2018-05-13 DIAGNOSIS — I5032 Chronic diastolic (congestive) heart failure: Secondary | ICD-10-CM | POA: Diagnosis not present

## 2018-05-13 DIAGNOSIS — J441 Chronic obstructive pulmonary disease with (acute) exacerbation: Secondary | ICD-10-CM | POA: Diagnosis not present

## 2018-05-13 LAB — TSH: TSH: 0.87 m[IU]/L (ref 0.40–4.50)

## 2018-05-13 LAB — COMPLETE METABOLIC PANEL WITH GFR
AG RATIO: 1.9 (calc) (ref 1.0–2.5)
ALBUMIN MSPROF: 3.9 g/dL (ref 3.6–5.1)
ALKALINE PHOSPHATASE (APISO): 51 U/L (ref 33–130)
ALT: 27 U/L (ref 6–29)
AST: 16 U/L (ref 10–35)
BILIRUBIN TOTAL: 0.6 mg/dL (ref 0.2–1.2)
BUN / CREAT RATIO: 31 (calc) — AB (ref 6–22)
BUN: 27 mg/dL — ABNORMAL HIGH (ref 7–25)
CHLORIDE: 101 mmol/L (ref 98–110)
CO2: 34 mmol/L — ABNORMAL HIGH (ref 20–32)
Calcium: 9.4 mg/dL (ref 8.6–10.4)
Creat: 0.86 mg/dL (ref 0.60–0.93)
GFR, Est African American: 74 mL/min/{1.73_m2} (ref >=60)
GFR, Est Non African American: 64 mL/min/{1.73_m2} (ref >=60)
Globulin: 2.1 g/dL (calc) (ref 1.9–3.7)
Glucose, Bld: 74 mg/dL (ref 65–99)
Potassium: 4.8 mmol/L (ref 3.5–5.3)
Sodium: 142 mmol/L (ref 135–146)
Total Protein: 6 g/dL — ABNORMAL LOW (ref 6.1–8.1)

## 2018-05-13 LAB — CBC WITH DIFFERENTIAL/PLATELET
BASOS ABS: 49 {cells}/uL (ref 0–200)
Basophils Relative: 0.4 %
EOS ABS: 25 {cells}/uL (ref 15–500)
Eosinophils Relative: 0.2 %
HCT: 42.6 % (ref 35.0–45.0)
HEMOGLOBIN: 14.3 g/dL (ref 11.7–15.5)
Lymphs Abs: 2460 cells/uL (ref 850–3900)
MCH: 28.3 pg (ref 27.0–33.0)
MCHC: 33.6 g/dL (ref 32.0–36.0)
MCV: 84.4 fL (ref 80.0–100.0)
MONOS PCT: 7.1 %
MPV: 11.2 fL (ref 7.5–12.5)
NEUTROS ABS: 8893 {cells}/uL — AB (ref 1500–7800)
Neutrophils Relative %: 72.3 %
Platelets: 264 10*3/uL (ref 140–400)
RBC: 5.05 10*6/uL (ref 3.80–5.10)
RDW: 14.4 % (ref 11.0–15.0)
Total Lymphocyte: 20 %
WBC mixed population: 873 cells/uL (ref 200–950)
WBC: 12.3 10*3/uL — ABNORMAL HIGH (ref 3.8–10.8)

## 2018-05-13 LAB — LIPID PANEL
Cholesterol: 173 mg/dL (ref <200)
HDL: 75 mg/dL (ref >50)
LDL Cholesterol (Calc): 77 mg/dL (calc)
Non-HDL Cholesterol (Calc): 98 mg/dL (calc) (ref <130)
Total CHOL/HDL Ratio: 2.3 (calc) (ref <5.0)
Triglycerides: 133 mg/dL (ref <150)

## 2018-05-13 LAB — HEMOGLOBIN A1C
EAG (MMOL/L): 7.1 (calc)
Hgb A1c MFr Bld: 6.1 % of total Hgb — ABNORMAL HIGH (ref <5.7)
MEAN PLASMA GLUCOSE: 128 (calc)

## 2018-05-13 LAB — MAGNESIUM: MAGNESIUM: 2.4 mg/dL (ref 1.5–2.5)

## 2018-05-13 LAB — INSULIN, RANDOM: Insulin: 7.1 u[IU]/mL (ref 2.0–19.6)

## 2018-05-13 LAB — VITAMIN D 25 HYDROXY (VIT D DEFICIENCY, FRACTURES): Vit D, 25-Hydroxy: 35 ng/mL (ref 30–100)

## 2018-05-15 ENCOUNTER — Encounter: Payer: Self-pay | Admitting: Internal Medicine

## 2018-05-16 ENCOUNTER — Ambulatory Visit
Admission: RE | Admit: 2018-05-16 | Discharge: 2018-05-16 | Disposition: A | Discharge status: Home / Self Care | Payer: Medicare Other | Source: Ambulatory Visit | Attending: Internal Medicine | Admitting: Internal Medicine

## 2018-05-16 DIAGNOSIS — Z1231 Encounter for screening mammogram for malignant neoplasm of breast: Secondary | ICD-10-CM

## 2018-05-16 DIAGNOSIS — I10 Essential (primary) hypertension: Secondary | ICD-10-CM | POA: Diagnosis not present

## 2018-05-16 DIAGNOSIS — Z79899 Other long term (current) drug therapy: Secondary | ICD-10-CM | POA: Diagnosis not present

## 2018-05-17 ENCOUNTER — Emergency Department (HOSPITAL_COMMUNITY)
Admission: EM | Admit: 2018-05-17 | Discharge: 2018-05-17 | Disposition: A | Discharge status: Home / Self Care | Payer: Medicare Other | Attending: Emergency Medicine | Admitting: Emergency Medicine

## 2018-05-17 ENCOUNTER — Encounter (HOSPITAL_COMMUNITY): Payer: Self-pay | Admitting: Emergency Medicine

## 2018-05-17 ENCOUNTER — Other Ambulatory Visit: Payer: Self-pay

## 2018-05-17 DIAGNOSIS — I110 Hypertensive heart disease with heart failure: Secondary | ICD-10-CM | POA: Diagnosis not present

## 2018-05-17 DIAGNOSIS — Y9389 Activity, other specified: Secondary | ICD-10-CM | POA: Diagnosis not present

## 2018-05-17 DIAGNOSIS — G20 Parkinson's disease: Secondary | ICD-10-CM | POA: Diagnosis not present

## 2018-05-17 DIAGNOSIS — Z79899 Other long term (current) drug therapy: Secondary | ICD-10-CM | POA: Insufficient documentation

## 2018-05-17 DIAGNOSIS — I5032 Chronic diastolic (congestive) heart failure: Secondary | ICD-10-CM | POA: Insufficient documentation

## 2018-05-17 DIAGNOSIS — J449 Chronic obstructive pulmonary disease, unspecified: Secondary | ICD-10-CM | POA: Diagnosis not present

## 2018-05-17 DIAGNOSIS — M7021 Olecranon bursitis, right elbow: Secondary | ICD-10-CM | POA: Insufficient documentation

## 2018-05-17 DIAGNOSIS — R21 Rash and other nonspecific skin eruption: Secondary | ICD-10-CM | POA: Diagnosis present

## 2018-05-17 DIAGNOSIS — Z7982 Long term (current) use of aspirin: Secondary | ICD-10-CM | POA: Insufficient documentation

## 2018-05-17 LAB — CBC WITH DIFFERENTIAL/PLATELET
Basophils Absolute: 0 10*3/uL (ref 0.0–0.1)
Basophils Relative: 0 %
Eosinophils Absolute: 0.1 10*3/uL (ref 0.0–0.7)
Eosinophils Relative: 0 %
HCT: 45.2 % (ref 36.0–46.0)
HEMOGLOBIN: 14.1 g/dL (ref 12.0–15.0)
LYMPHS ABS: 3.2 10*3/uL (ref 0.7–4.0)
LYMPHS PCT: 19 %
MCH: 29 pg (ref 26.0–34.0)
MCHC: 31.2 g/dL (ref 30.0–36.0)
MCV: 92.8 fL (ref 78.0–100.0)
Monocytes Absolute: 1.8 10*3/uL — ABNORMAL HIGH (ref 0.1–1.0)
Monocytes Relative: 11 %
NEUTROS ABS: 11.6 10*3/uL — AB (ref 1.7–7.7)
NEUTROS PCT: 70 %
PLATELETS: 262 10*3/uL (ref 150–400)
RBC: 4.87 MIL/uL (ref 3.87–5.11)
RDW: 15.8 % — ABNORMAL HIGH (ref 11.5–15.5)
WBC: 16.7 10*3/uL — AB (ref 4.0–10.5)

## 2018-05-17 LAB — BASIC METABOLIC PANEL
Anion gap: 8 (ref 5–15)
BUN: 38 mg/dL — AB (ref 8–23)
CO2: 30 mmol/L (ref 22–32)
Calcium: 9 mg/dL (ref 8.9–10.3)
Chloride: 106 mmol/L (ref 98–111)
Creatinine, Ser: 1.01 mg/dL — ABNORMAL HIGH (ref 0.44–1.00)
GFR calc Af Amer: 60 mL/min — ABNORMAL LOW (ref >60)
GFR, EST NON AFRICAN AMERICAN: 52 mL/min — AB (ref >60)
GLUCOSE: 93 mg/dL (ref 70–99)
POTASSIUM: 4.3 mmol/L (ref 3.5–5.1)
Sodium: 144 mmol/L (ref 135–145)

## 2018-05-17 LAB — URINALYSIS, ROUTINE W REFLEX MICROSCOPIC
BILIRUBIN URINE: NEGATIVE
GLUCOSE, UA: NEGATIVE
Hgb urine dipstick: NEGATIVE
Ketones, ur: NEGATIVE
LEUKOCYTES UA: NEGATIVE
Nitrite: NEGATIVE
PH: 6 (ref 5.0–8.0)
Protein, ur: NEGATIVE
SPECIFIC GRAVITY, URINE: 1.014 (ref 1.001–1.03)

## 2018-05-17 LAB — MICROALBUMIN / CREATININE URINE RATIO
Creatinine, Urine: 24 mg/dL (ref 20–275)
Microalb, Ur: <0.2 mg/dL

## 2018-05-17 MED ORDER — ACETAMINOPHEN 500 MG PO TABS
500.0000 mg | ORAL_TABLET | Freq: Once | ORAL | Status: AC
  Administered 2018-05-17: 500 mg via ORAL
  Filled 2018-05-17: qty 1

## 2018-05-17 MED ORDER — DOXYCYCLINE HYCLATE 100 MG PO CAPS: 100.0000 mg | ORAL_CAPSULE | Freq: Two times a day (BID) | ORAL | 0 refills | Status: DC

## 2018-05-17 MED ORDER — DOXYCYCLINE HYCLATE 100 MG PO TABS
100.0000 mg | ORAL_TABLET | Freq: Once | ORAL | Status: AC
  Administered 2018-05-17: 100 mg via ORAL
  Filled 2018-05-17: qty 1

## 2018-05-17 NOTE — ED Triage Notes (Addendum)
Family member states patient has abscess to right elbow that is draining yellow drainage since Sunday. Redness noted around area on right elbow that radiates up into upper right arm at triage.

## 2018-05-17 NOTE — ED Provider Notes (Signed)
[facility] Provider Note   CSN: [id number] Arrival date & time: 05/17/18  1727     History   Chief Complaint Chief Complaint  Patient presents with  . Abscess    HPI Marie [name] is a 82 y.o. female.  HPI Patient presents with pain and swelling in her right elbow.  For the last 2 days she has had some drainage and redness.  Became more red the last day.  Family member states she pressed on it and clear liquid came out.  Some mild pain with moving the elbow.  No fevers. Past Medical History:  Diagnosis Date  . Balance disorder 2008.     Falls a lot.  She can be standing and then leans too far over to one side   . Bulging disc   . Chronic bronchitis ([place])    due to congestion at times, on prednisone and advair  . COPD (chronic obstructive pulmonary disease) ([place])   . COPD with acute exacerbation ([place]) 12/15/2017  . Depression    many years ago  . GERD (gastroesophageal reflux disease)   . Hearing loss    mild  . Hyperlipidemia   . Hypertension   . Iatrogenic adrenal insufficiency ([place])   . Incontinence    not indicated at this visit.  Marland Kitchen LVH (left ventricular hypertrophy) due to hypertensive disease    a. 02/2017: echo showing an EF of 65-70% with moderate LVH, hyperdynamic LV with subvalvular gradient of 70 mmHg, SAM, and mild MR  . Neuropathy    legs stay numb   . Osteoarthritis    hands,   . Osteoporosis   . Shortness of breath dyspnea   . Tubulovillous adenoma of rectum   . Wears dentures   . Wears glasses     Patient Active Problem List   Diagnosis Date Noted  . Asthmatic bronchitis 01/17/2018  . COPD with acute exacerbation ([place]) 12/15/2017  . Chronic diastolic CHF (congestive heart failure) ([place]) 12/15/2017  . Anxiety 12/15/2017  . Subvalvular aortic stenosis determined by imaging 11/15/2017  . Pedal edema 11/15/2017  . Problem with medical care compliance 11/11/2017  . Aortic atherosclerosis ([place]) 10/19/2017  . Dysphagia  09/28/2017  . Chronic respiratory failure with hypoxia, on home O2 therapy ([place]) 09/20/2017  . Hypertensive heart disease 03/29/2017  . Protein-calorie malnutrition, severe 03/17/2017  . Parkinson's plus syndrome ([place]) 03/04/2017  . Restrictive lung disease 02/24/2017  . LVH (left ventricular hypertrophy) 01/07/2017  . Depression, major, in remission ([place]) 09/22/2016  . At high risk for falls 10/16/2015  . Prediabetes 08/23/2014  . Vitamin D deficiency 08/23/2014  . Medication management 08/23/2014  . Unstable Gait 01/30/2014  . Incontinence   . Hyperlipidemia   . GERD (gastroesophageal reflux disease)   . Hereditary and idiopathic peripheral neuropathy   . Essential hypertension   . Tubulovillous adenoma of rectum 09/04/2011    Past Surgical History:  Procedure Laterality Date  . ABDOMINAL HYSTERECTOMY    . APPENDECTOMY    . CHONDROPLASTY  09/19/2014   Procedure: CHONDROPLASTY;  Surgeon: Alta Corning, MD;  Location: [facility];  Service: Orthopedics;;  . DILATION AND CURETTAGE OF UTERUS    . EXTERNAL FIXATION LEG  10/25/2012   Procedure: EXTERNAL FIXATION LEG;  Surgeon: Rozanna Box, MD;  Location: [facility];  Service: Orthopedics;  Laterality: Right;  . EYE SURGERY     bilateral cataract surgery and lens implant  . FINGER SURGERY  fusions and debridements for OA  . INCONTINENCE SURGERY     multiple procedures, not cured  . KNEE ARTHROSCOPY WITH LATERAL MENISECTOMY Right 09/19/2014   Procedure: KNEE ARTHROSCOPY WITH LATERAL MENISECTOMY;  Surgeon: Alta Corning, MD;  Location: [facility];  Service: Orthopedics;  Laterality: Right;  . KNEE ARTHROSCOPY WITH MEDIAL MENISECTOMY Right 09/19/2014   Procedure: RIGHT KNEE ARTHROSCOPY WITH MEDIAL AND LATERAL MENISECTOMIES. CHONDROPLASTY OF PATELLA-FEMORAL JOINT;  Surgeon: Alta Corning, MD;  Location: [facility];  Service: Orthopedics;  Laterality: Right;  . RECTAL BIOPSY  09/21/2011    Procedure: BIOPSY RECTAL;  Surgeon: Merrie Roof, MD;  Location: [facility];  Service: General;  Laterality: N/A;  3-4 cm  . RECTAL SURGERY     by dr. Marlou Starks, removal of polyp  . TONSILLECTOMY       OB History   None      Home Medications    Prior to Admission medications   Medication Sig Start Date End Date Taking? Authorizing Provider  albuterol (PROVENTIL) (2.5 MG/3ML) 0.083% nebulizer solution Use 1 ampule in nebulizer 4 times a day or every 4 hours as needed to rescue asthma. Patient taking differently: Take 2.5 mg by nebulization See admin instructions. Use 1 ampule in nebulizer 4 times a day or every 4 hours as needed to rescue asthma. 04/04/18 04/04/19 Yes Erline Hau, MD  ALPRAZolam Duanne Moron) 0.5 MG tablet Take 1 tablet 3 x / day for Anxiety Patient taking differently: Take 0.5 mg by mouth 2 (two) times daily.  02/17/18  Yes Liane Comber, NP  aspirin 81 MG tablet Take 81 mg by mouth daily. Buys OTC   Yes [provider]  carbidopa-levodopa (SINEMET IR) 25-100 MG tablet Take 2 tablet at 9am and 2 tablets at 3pm. Patient taking differently: Take 2 tablets by mouth 2 (two) times daily. Take 2 tablet at 9am and 2 tablets at 3pm. 03/04/18  Yes Patel, Donika K, DO  Cholecalciferol (VITAMIN D) 2000 units CAPS Take 2,000 capsules by mouth daily.   Yes [provider]  ENSURE (ENSURE) Take 237 mLs by mouth daily as needed.    Yes [provider]  furosemide (LASIX) 20 MG tablet Take 20 mg by mouth daily.   Yes [provider]  omeprazole (PRILOSEC) 40 MG capsule Take 40 mg by mouth daily.    Yes [provider]  OXYGEN Inhale 2 L into the lungs daily as needed (for shortness of breath). Setting is on 2 when needed    Yes [provider]  potassium chloride SA (K-DUR,KLOR-CON) 20 MEQ tablet Take 1 tablet by mouth daily. 02/17/18  Yes Liane Comber, NP  predniSONE (DELTASONE) 10 MG tablet Take 1 tablet (10 mg total) by mouth  daily with breakfast. Take 1 tablet 3 x/day or as directed Patient taking differently: Take 10 mg by mouth 2 (two) times daily.  04/16/18  Yes [name], MD  sertraline (ZOLOFT) 25 MG tablet Take 1 tablet (25 mg total) by mouth daily. 02/17/18  Yes Corbett, Caryl Pina, NP  TRELEGY ELLIPTA 100-62.5-25 MCG/INH AEPB INHALE 1 PUFF ONCE DAILY 05/04/18  Yes [provider]  budesonide-formoterol (SYMBICORT) 80-4.5 MCG/ACT inhaler Inhale 2 puffs into the lungs 2 (two) times daily. Patient not taking: Reported on 05/17/2018 04/04/18   Isaac Bliss, Rayford Halsted, MD  doxycycline (VIBRAMYCIN) 100 MG capsule Take 1 capsule (100 mg total) by mouth 2 (two) times daily. 05/17/18   Davonna Belling, MD  Family History Family History  Problem Relation Age of Onset  . Cancer Sister        pt unaware of what kind  . High blood pressure Mother   . COPD Mother   . Heart attack Father     Social History Social History   Tobacco Use  . Smoking status: Never Smoker  . Smokeless tobacco: Never Used  Substance Use Topics  . Alcohol use: No  . Drug use: No     Allergies   Demerol  [meperidine hcl]; Ertapenem; Codeine; Demerol; Morphine and related; Other; and Sulfate   Review of Systems Review of Systems  Constitutional: Negative for appetite change and fever.  Respiratory: Negative for cough.   Gastrointestinal: Negative for abdominal pain.  Genitourinary: Negative for flank pain.  Musculoskeletal: Negative for back pain.       Right elbow swelling.  Skin: Positive for wound.  Neurological: Negative for light-headedness.  Hematological: Negative for adenopathy.  Psychiatric/Behavioral: Negative for confusion.     Physical Exam Updated Vital Signs BP (!) 139/55 (BP Location: Right Arm)   Pulse 69   Temp 98 F (36.7 C) (Oral)   Resp 17   Ht 5\' 4"  (1.626 m)   Wt 60.3 kg (133 lb)   SpO2 100%   BMI 22.83 kg/m   Physical Exam  Constitutional: She appears well-developed.    HENT:  Head: Atraumatic.  Neck: Neck supple.  Cardiovascular: Normal rate.  Pulmonary/Chest: Effort normal.  Abdominal: There is no tenderness.  Musculoskeletal:  Erythema on right elbow.  Some soft fluctuance.  Clear drainage.  No pain with flexion or extension at the elbow.  Neurovascular intact distally.   Neurological: She is alert.  Skin: Capillary refill takes less than 2 seconds.     ED Treatments / Results  Labs (all labs ordered are listed, but only abnormal results are displayed) Labs Reviewed  CBC WITH DIFFERENTIAL/PLATELET - Abnormal; Notable for the following components:      Result Value   WBC 16.7 (*)    RDW 15.8 (*)    Neutro Abs 11.6 (*)    Monocytes Absolute 1.8 (*)    All other components within normal limits  BASIC METABOLIC PANEL - Abnormal; Notable for the following components:   BUN 38 (*)    Creatinine, Ser 1.01 (*)    GFR calc non Af Amer 52 (*)    GFR calc Af Amer 60 (*)    All other components within normal limits    EKG None  Radiology Mm 3d Screen Breast Bilateral  Result Date: 05/17/2018 CLINICAL DATA:  Screening. EXAM: DIGITAL SCREENING BILATERAL MAMMOGRAM WITH TOMO AND CAD COMPARISON:  Previous exam(s). ACR Breast Density Category c: The breast tissue is heterogeneously dense, which may obscure small masses. FINDINGS: There are no findings suspicious for malignancy. Images were processed with CAD. IMPRESSION: No mammographic evidence of malignancy. A result letter of this screening mammogram will be mailed directly to the patient. RECOMMENDATION: Screening mammogram in one year. (Code:SM-B-01Y) BI-RADS CATEGORY  1: Negative. Electronically Signed   By: Abelardo Diesel M.D.   On: 05/17/2018 10:35    Procedures Procedures (including critical care time)  Medications Ordered in ED Medications  doxycycline (VIBRA-TABS) tablet 100 mg (100 mg Oral Given 05/17/18 2202)  acetaminophen (TYLENOL) tablet 500 mg (500 mg Oral Given 05/17/18 2202)      Initial Impression / Assessment and Plan / ED Course  I have reviewed the triage vital signs and the nursing  notes.  Pertinent labs & imaging results that were available during my care of the patient were reviewed by me and considered in my medical decision making (see chart for details).     I think patient has a olecranon bursitis of the right elbow.  It is draining.  Doubt severe infection but with the erythema and systemic white count will treat with antibiotics.  Follow-up with PCP as needed.  May need Ortho follow-up if does not improve per  Final Clinical Impressions(s) / ED Diagnoses   Final diagnoses:  Olecranon bursitis of right elbow    ED Discharge Orders        Ordered    doxycycline (VIBRAMYCIN) 100 MG capsule  2 times daily     05/17/18 2143       Davonna Belling, MD 05/17/18 2213

## 2018-05-25 DIAGNOSIS — I110 Hypertensive heart disease with heart failure: Secondary | ICD-10-CM | POA: Diagnosis not present

## 2018-05-25 DIAGNOSIS — I5032 Chronic diastolic (congestive) heart failure: Secondary | ICD-10-CM | POA: Diagnosis not present

## 2018-05-25 DIAGNOSIS — J441 Chronic obstructive pulmonary disease with (acute) exacerbation: Secondary | ICD-10-CM | POA: Diagnosis not present

## 2018-05-25 DIAGNOSIS — J9621 Acute and chronic respiratory failure with hypoxia: Secondary | ICD-10-CM | POA: Diagnosis not present

## 2018-05-25 DIAGNOSIS — G629 Polyneuropathy, unspecified: Secondary | ICD-10-CM | POA: Diagnosis not present

## 2018-05-25 DIAGNOSIS — G20 Parkinson's disease: Secondary | ICD-10-CM | POA: Diagnosis not present

## 2018-06-06 DIAGNOSIS — I10 Essential (primary) hypertension: Secondary | ICD-10-CM | POA: Diagnosis not present

## 2018-06-06 DIAGNOSIS — J449 Chronic obstructive pulmonary disease, unspecified: Secondary | ICD-10-CM | POA: Diagnosis not present

## 2018-06-06 DIAGNOSIS — M7021 Olecranon bursitis, right elbow: Secondary | ICD-10-CM | POA: Diagnosis not present

## 2018-06-06 DIAGNOSIS — J9611 Chronic respiratory failure with hypoxia: Secondary | ICD-10-CM | POA: Diagnosis not present

## 2018-06-10 ENCOUNTER — Encounter: Payer: Self-pay | Admitting: Physician Assistant

## 2018-06-10 ENCOUNTER — Ambulatory Visit (INDEPENDENT_AMBULATORY_CARE_PROVIDER_SITE_OTHER): Payer: Medicare Other | Admitting: Physician Assistant

## 2018-06-10 ENCOUNTER — Telehealth: Payer: Self-pay | Admitting: Cardiology

## 2018-06-10 VITALS — BP 122/64 | HR 88 | Temp 98.1°F | Resp 14 | Ht 64.0 in | Wt 141.0 lb

## 2018-06-10 DIAGNOSIS — M7021 Olecranon bursitis, right elbow: Secondary | ICD-10-CM | POA: Diagnosis not present

## 2018-06-10 MED ORDER — DOXYCYCLINE HYCLATE 100 MG PO CAPS: ORAL_CAPSULE | ORAL | 0 refills | Status: DC

## 2018-06-10 NOTE — Progress Notes (Signed)
Subjective:    Patient ID: Marie [name], female    DOB: 1935/12/25, 82 y.o.   MRN: [id number]  HPI 82 y.o. WF history of neuropathy, HTN, unstable gait, restrictive lung disease, chronic respiratory failure on 02, COPD, diastolic CHF and anxiety presents with right elbow pain.  She was at the ER 05/17/2018 with right elbow olecrenon bursitis with drainage, elevated WBC and pain, she was given doxycycline. They did not get an Xray.   She states she had a fall that caused the right elbow to swell. She is resting her elbow on the chair at this time as well. She has had warmth, redness but denies pain and has full ROM of her right elbow. No personal history of gout, no fever or chills.   Blood pressure 122/64, pulse 88, temperature 98.1 F (36.7 C), resp. rate 14, height 5\' 4"  (1.626 m), weight 141 lb (64 kg), SpO2 96 %.  Medications Current Outpatient Medications on File Prior to Visit  Medication Sig  . albuterol (PROVENTIL) (2.5 MG/3ML) 0.083% nebulizer solution Use 1 ampule in nebulizer 4 times a day or every 4 hours as needed to rescue asthma. (Patient taking differently: Take 2.5 mg by nebulization See admin instructions. Use 1 ampule in nebulizer 4 times a day or every 4 hours as needed to rescue asthma.)  . ALPRAZolam (XANAX) 0.5 MG tablet Take 1 tablet 3 x / day for Anxiety (Patient taking differently: Take 0.5 mg by mouth 2 (two) times daily. )  . aspirin 81 MG tablet Take 81 mg by mouth daily. Buys OTC  . budesonide-formoterol (SYMBICORT) 80-4.5 MCG/ACT inhaler Inhale 2 puffs into the lungs 2 (two) times daily.  . carbidopa-levodopa (SINEMET IR) 25-100 MG tablet Take 2 tablet at 9am and 2 tablets at 3pm. (Patient taking differently: Take 2 tablets by mouth 2 (two) times daily. Take 2 tablet at 9am and 2 tablets at 3pm.)  . Cholecalciferol (VITAMIN D) 2000 units CAPS Take 2,000 capsules by mouth daily.  Marland Kitchen ENSURE (ENSURE) Take 237 mLs by mouth daily as needed.   . furosemide (LASIX)  20 MG tablet Take 20 mg by mouth daily.  Marland Kitchen omeprazole (PRILOSEC) 40 MG capsule Take 40 mg by mouth daily.   . OXYGEN Inhale 2 L into the lungs daily as needed (for shortness of breath). Setting is on 2 when needed   . potassium chloride SA (K-DUR,KLOR-CON) 20 MEQ tablet Take 1 tablet by mouth daily.  . predniSONE (DELTASONE) 10 MG tablet Take 1 tablet (10 mg total) by mouth daily with breakfast. Take 1 tablet 3 x/day or as directed (Patient taking differently: Take 10 mg by mouth 2 (two) times daily. )  . sertraline (ZOLOFT) 25 MG tablet Take 1 tablet (25 mg total) by mouth daily.  . TRELEGY ELLIPTA 100-62.5-25 MCG/INH AEPB INHALE 1 PUFF ONCE DAILY   No current facility-administered medications on file prior to visit.     Problem list She has Tubulovillous adenoma of rectum; Incontinence; Hyperlipidemia; GERD (gastroesophageal reflux disease); Hereditary and idiopathic peripheral neuropathy; Essential hypertension; Unstable Gait; Prediabetes; Vitamin D deficiency; Medication management; At high risk for falls; Depression, major, in remission ([place]); LVH (left ventricular hypertrophy); Restrictive lung disease; Parkinson's plus syndrome ([place]); Protein-calorie malnutrition, severe; Hypertensive heart disease; Chronic respiratory failure with hypoxia, on home O2 therapy ([place]); Dysphagia; Aortic atherosclerosis ([place]); Problem with medical care compliance; Subvalvular aortic stenosis determined by imaging; Pedal edema; COPD with acute exacerbation ([place]); Chronic diastolic CHF (congestive heart failure) ([place]); Anxiety;  and Asthmatic bronchitis on their problem list.   Review of Systems See HPI    Objective:   Physical Exam  Constitutional: She is oriented to person, place, and time. No distress.  HENT:  Head: Normocephalic and atraumatic.  Mouth/Throat: No oropharyngeal exudate.  dentures in place  Eyes: Conjunctivae are normal. No scleral icterus.  Neck: Normal range of motion. Neck supple. No  JVD present. No thyromegaly present.  Cardiovascular: An irregular rhythm present. Exam reveals no gallop.  Murmur heard. Pulmonary/Chest: Effort normal and breath sounds normal. No respiratory distress. She has no wheezes. She has no rales. She exhibits no tenderness.  Abdominal: Soft. Bowel sounds are normal. She exhibits no distension and no mass. There is no tenderness. There is no rebound and no guarding.  Musculoskeletal: Normal range of motion. She exhibits edema.  Right elbow with swelling, erythema, warmth, full ROM without tenderness to palpation.   Lymphadenopathy:    She has no cervical adenopathy.  Neurological: She is alert and oriented to person, place, and time.  Skin: Skin is dry. No pallor.  Senile purpura  Psychiatric: She has a normal mood and affect. Her behavior is normal. Judgment and thought content normal.  Nursing note and vitals reviewed.           Assessment & Plan:    Olecranon bursitis of right elbow with possible cellulitis rule out infection, gout, etc Elbow has been drained once at ER, will refer to ortho for evaluation.  -     Ambulatory referral to Orthopedics -     doxycycline (VIBRAMYCIN) 100 MG capsule; Take 1 capsule twice daily with food  The patient was advised to call immediately if she has any concerning symptoms in the interval. The patient voices understanding of current treatment options and is in agreement with the current care plan.The patient knows to call the clinic with any problems, questions or concerns or go to the ER if any further progression of symptoms.

## 2018-06-10 NOTE — Patient Instructions (Signed)
Will start on doxycycline   AVOID putting pressure on the elbow   Elbow Bursitis Elbow bursitis is inflammation of the fluid-filled sac (bursa) between the tip of your elbow bone (olecranon) and your skin. Elbow bursitis may also be called olecranon bursitis. Normally, the olecranon bursa has only a small amount of fluid in it to cushion and protect your elbow bone. Elbow bursitis causes fluid to build up inside the bursa. Over time, this swelling and inflammation can cause pain when you bend or lean on your elbow. What are the causes? Elbow bursitis may be caused by:  Elbow injury (acute trauma).  Leaning on hard surfaces for long periods of time.  Infection from an injury that breaks the skin near your elbow.  A bone growth (spur) that forms at the tip of your elbow.  A medical condition that causes inflammation in your body, such as gout or rheumatoid arthritis.  The cause may also be unknown. What are the signs or symptoms? The first sign of elbow bursitis is usually swelling over the tip of your elbow. This can grow to be the size of a golf ball. This may start suddenly or develop gradually. You may also have:  Pain when bending or leaning on your elbow.  Restricted movement of your elbow.  If your bursitis is caused by an infection, symptoms may also include:  Redness, warmth, and tenderness of the elbow.  Drainage of pus from the swollen area over your elbow, if the skin breaks open.  How is this diagnosed? Your health care provider may be able to diagnose elbow bursitis based on your signs and symptoms, especially if you have recently been injured. Your health care provider will also do a physical exam. This may include:  X-rays to look for a bone spur or a bone fracture.  Draining fluid from the bursa to test it for infection.  Blood tests to rule out gout or rheumatoid arthritis.  How is this treated? Treatment for elbow bursitis depends on the cause. Treatment  may include:  Medicines. These may include: ? Over-the-counter medicines to relieve pain and inflammation. ? Antibiotic medicines to fight infection. ? Injections of anti-inflammatory medicines (steroids).  Wrapping your elbow with a bandage.  Draining fluid from the bursa.  Wearing elbow pads.  If your bursitis does not get better with treatment, surgery may be needed to remove the bursa. Follow these instructions at home:  Take medicines only as directed by your health care provider.  If you were prescribed an antibiotic medicine, finish all of it even if you start to feel better.  If your bursitis is caused by an injury, rest your elbow and wear your bandage as directed by your health care provider. You may alsoapply ice to the injured area as directed by your health care provider: ? Put ice in a plastic bag. ? Place a towel between your skin and the bag. ? Leave the ice on for 20 minutes, 2-3 times per day.  Avoid any activities that cause elbow pain.  Use elbow pads or elbow wraps to cushion your elbow. Contact a health care provider if:  You have a fever.  Your symptoms do not get better with treatment.  Your pain or swelling gets worse.  Your elbow pain or swelling goes away and then returns.  You have drainage of pus from the swollen area over your elbow. This information is not intended to replace advice given to you by your health care provider. Make  sure you discuss any questions you have with your health care provider. Document Released: 11/04/2006 Document Revised: 03/12/2016 Document Reviewed: 06/13/2014 Elsevier Interactive Patient Education  [employer].

## 2018-06-10 NOTE — Telephone Encounter (Signed)
Called to schedule appt. From recall list and spoke with [facility] and he stated that they had just come from PCP and does not feel like they need to follow up with cardiologist at this time and if anything changes they will call back. No issues going on with heart at this time.

## 2018-06-15 ENCOUNTER — Ambulatory Visit (INDEPENDENT_AMBULATORY_CARE_PROVIDER_SITE_OTHER): Payer: Medicare Other | Admitting: Family Medicine

## 2018-06-15 ENCOUNTER — Encounter (INDEPENDENT_AMBULATORY_CARE_PROVIDER_SITE_OTHER): Payer: Self-pay | Admitting: Family Medicine

## 2018-06-15 ENCOUNTER — Encounter (INDEPENDENT_AMBULATORY_CARE_PROVIDER_SITE_OTHER): Payer: Self-pay

## 2018-06-15 DIAGNOSIS — M25521 Pain in right elbow: Secondary | ICD-10-CM

## 2018-06-15 NOTE — Progress Notes (Signed)
Office Visit Note   Patient: Marie [name]           Date of Birth: 08/14/1936           MRN: [id number] Visit Date: 06/15/2018 Requested by: Vicie Mutters, PA-C [address] [place], [place] [address] PCP: [name], MD  Subjective: Chief Complaint  Patient presents with  . Right Elbow - Bursitis    Currently on Doxycycline 100 mg 1 bid for the elbow.    HPI: She is a right-hand-dominant female with right elbow pain.  1 month ago she developed acute onset of swelling and redness on the posterior right elbow with no injury.  She went to the ER where her elbow was aspirated and she was placed on antibiotics.  Symptoms improved but then a few days ago the same thing happened, again without injury.  She went to her PCP and was placed on doxycycline and it has significantly improved.  She does not have any pain this time.  No fever or chills, no personal history of gout.               ROS: No history of diabetes.  She does have heart disease, hypertension and lung disease.  All other systems were negative.  Objective: Vital Signs: There were no vitals taken for this visit.  Physical Exam:  Right elbow: Full flexion and extension pain-free.  Full pronation and supination of the forearm pain-free.  No pain with elbow flexion or extension against resistance.  She has 1+ swelling of the olecranon bursa with no warmth or erythema, no tenderness.  Imaging: None today.  Assessment & Plan: 1.  Improving right elbow olecranon bursitis, seems like it is infectious due to response to antibiotics. -Finish course of doxycycline.  Trial of ice applications. -If it swells up and becomes red again, her daughter will send me a photo of it and we will decide whether to try another round of antibiotics or bring her in for aspiration to send the fluid for culture and crystal analysis.  We would also do x-rays at that point.   Follow-Up Instructions: No follow-ups on file.      Procedures: None today.   PMFS History: Patient Active Problem List   Diagnosis Date Noted  . Asthmatic bronchitis 01/17/2018  . COPD with acute exacerbation ([place]) 12/15/2017  . Chronic diastolic CHF (congestive heart failure) ([place]) 12/15/2017  . Anxiety 12/15/2017  . Subvalvular aortic stenosis determined by imaging 11/15/2017  . Pedal edema 11/15/2017  . Problem with medical care compliance 11/11/2017  . Aortic atherosclerosis ([place]) 10/19/2017  . Dysphagia 09/28/2017  . Chronic respiratory failure with hypoxia, on home O2 therapy ([place]) 09/20/2017  . Hypertensive heart disease 03/29/2017  . Protein-calorie malnutrition, severe 03/17/2017  . Parkinson's plus syndrome ([place]) 03/04/2017  . Restrictive lung disease 02/24/2017  . LVH (left ventricular hypertrophy) 01/07/2017  . Depression, major, in remission ([place]) 09/22/2016  . At high risk for falls 10/16/2015  . Prediabetes 08/23/2014  . Vitamin D deficiency 08/23/2014  . Medication management 08/23/2014  . Unstable Gait 01/30/2014  . Incontinence   . Hyperlipidemia   . GERD (gastroesophageal reflux disease)   . Hereditary and idiopathic peripheral neuropathy   . Essential hypertension   . Tubulovillous adenoma of rectum 09/04/2011   Past Medical History:  Diagnosis Date  . Balance disorder 2008.     Falls a lot.  She can be standing and then leans too far over to one  side   . Bulging disc   . Chronic bronchitis ([place])    due to congestion at times, on prednisone and advair  . COPD (chronic obstructive pulmonary disease) ([place])   . COPD with acute exacerbation ([place]) 12/15/2017  . Depression    many years ago  . GERD (gastroesophageal reflux disease)   . Hearing loss    mild  . Hyperlipidemia   . Hypertension   . Iatrogenic adrenal insufficiency ([place])   . Incontinence    not indicated at this visit.  Marland Kitchen LVH (left ventricular hypertrophy) due to hypertensive disease    a. 02/2017: echo showing an EF of 65-70%  with moderate LVH, hyperdynamic LV with subvalvular gradient of 70 mmHg, SAM, and mild MR  . Neuropathy    legs stay numb   . Osteoarthritis    hands,   . Osteoporosis   . Shortness of breath dyspnea   . Tubulovillous adenoma of rectum   . Wears dentures   . Wears glasses     Family History  Problem Relation Age of Onset  . Cancer Sister        pt unaware of what kind  . High blood pressure Mother   . COPD Mother   . Heart attack Father     Past Surgical History:  Procedure Laterality Date  . ABDOMINAL HYSTERECTOMY    . APPENDECTOMY    . CHONDROPLASTY  09/19/2014   Procedure: CHONDROPLASTY;  Surgeon: Alta Corning, MD;  Location: [facility];  Service: Orthopedics;;  . DILATION AND CURETTAGE OF UTERUS    . EXTERNAL FIXATION LEG  10/25/2012   Procedure: EXTERNAL FIXATION LEG;  Surgeon: Rozanna Box, MD;  Location: [facility];  Service: Orthopedics;  Laterality: Right;  . EYE SURGERY     bilateral cataract surgery and lens implant  . FINGER SURGERY     fusions and debridements for OA  . INCONTINENCE SURGERY     multiple procedures, not cured  . KNEE ARTHROSCOPY WITH LATERAL MENISECTOMY Right 09/19/2014   Procedure: KNEE ARTHROSCOPY WITH LATERAL MENISECTOMY;  Surgeon: Alta Corning, MD;  Location: [facility];  Service: Orthopedics;  Laterality: Right;  . KNEE ARTHROSCOPY WITH MEDIAL MENISECTOMY Right 09/19/2014   Procedure: RIGHT KNEE ARTHROSCOPY WITH MEDIAL AND LATERAL MENISECTOMIES. CHONDROPLASTY OF PATELLA-FEMORAL JOINT;  Surgeon: Alta Corning, MD;  Location: [facility];  Service: Orthopedics;  Laterality: Right;  . RECTAL BIOPSY  09/21/2011   Procedure: BIOPSY RECTAL;  Surgeon: Merrie Roof, MD;  Location: [facility];  Service: General;  Laterality: N/A;  3-4 cm  . RECTAL SURGERY     by dr. Marlou Starks, removal of polyp  . TONSILLECTOMY     Social History   Occupational History  . Not on file  Tobacco Use  . Smoking status: Never Smoker   . Smokeless tobacco: Never Used  Substance and Sexual Activity  . Alcohol use: No  . Drug use: No  . Sexual activity: Not on file

## 2018-07-08 DIAGNOSIS — R531 Weakness: Secondary | ICD-10-CM | POA: Diagnosis not present

## 2018-07-13 ENCOUNTER — Encounter: Payer: Self-pay | Admitting: Physician Assistant

## 2018-07-13 ENCOUNTER — Other Ambulatory Visit: Payer: Self-pay | Admitting: Physician Assistant

## 2018-07-13 ENCOUNTER — Ambulatory Visit (HOSPITAL_COMMUNITY)
Admission: RE | Admit: 2018-07-13 | Discharge: 2018-07-13 | Disposition: A | Discharge status: Home / Self Care | Payer: Medicare Other | Source: Ambulatory Visit | Attending: Physician Assistant | Admitting: Physician Assistant

## 2018-07-13 ENCOUNTER — Ambulatory Visit (INDEPENDENT_AMBULATORY_CARE_PROVIDER_SITE_OTHER): Payer: Medicare Other | Admitting: Physician Assistant

## 2018-07-13 VITALS — BP 120/68 | HR 61 | Temp 97.8°F | Resp 14 | Ht 64.0 in | Wt 151.6 lb

## 2018-07-13 DIAGNOSIS — X58XXXA Exposure to other specified factors, initial encounter: Secondary | ICD-10-CM | POA: Insufficient documentation

## 2018-07-13 DIAGNOSIS — M25572 Pain in left ankle and joints of left foot: Secondary | ICD-10-CM

## 2018-07-13 DIAGNOSIS — S82832A Other fracture of upper and lower end of left fibula, initial encounter for closed fracture: Secondary | ICD-10-CM

## 2018-07-13 DIAGNOSIS — Z23 Encounter for immunization: Secondary | ICD-10-CM | POA: Diagnosis not present

## 2018-07-13 LAB — CBC WITH DIFFERENTIAL/PLATELET
BASOS ABS: 57 {cells}/uL (ref 0–200)
BASOS PCT: 0.6 %
Eosinophils Absolute: 67 cells/uL (ref 15–500)
Eosinophils Relative: 0.7 %
HCT: 39.6 % (ref 35.0–45.0)
HEMOGLOBIN: 13.3 g/dL (ref 11.7–15.5)
Lymphs Abs: 2765 cells/uL (ref 850–3900)
MCH: 29.6 pg (ref 27.0–33.0)
MCHC: 33.6 g/dL (ref 32.0–36.0)
MCV: 88 fL (ref 80.0–100.0)
MPV: 11 fL (ref 7.5–12.5)
Monocytes Relative: 8.8 %
Neutro Abs: 5776 cells/uL (ref 1500–7800)
Neutrophils Relative %: 60.8 %
Platelets: 235 10*3/uL (ref 140–400)
RBC: 4.5 10*6/uL (ref 3.80–5.10)
RDW: 15.3 % — ABNORMAL HIGH (ref 11.0–15.0)
TOTAL LYMPHOCYTE: 29.1 %
WBC: 9.5 10*3/uL (ref 3.8–10.8)
WBCMIX: 836 {cells}/uL (ref 200–950)

## 2018-07-13 LAB — URIC ACID: Uric Acid, Serum: 4.2 mg/dL (ref 2.5–7.0)

## 2018-07-13 MED ORDER — MELOXICAM 15 MG PO TABS: ORAL_TABLET | ORAL | 1 refills | Status: DC

## 2018-07-13 NOTE — Progress Notes (Signed)
Subjective:    Patient ID: Marie [name], female    DOB: 1936-06-23, 82 y.o.   MRN: [id number]  HPI 82 y.o. WF presents with left foot pain. She fell 1 week ago, no LOC. Feels that her left leg gave way. She has pain at her anterior/lateral ankle. She can bear weight. She has redness, swelling, warmth in her leg. She took aleve one time that did help.   Blood pressure 120/68, pulse 61, temperature 97.8 F (36.6 C), resp. rate 14, height 5\' 4"  (1.626 m), weight 151 lb 9.6 oz (68.8 kg), SpO2 95 %.  Medications Current Outpatient Medications on File Prior to Visit  Medication Sig  . albuterol (PROVENTIL) (2.5 MG/3ML) 0.083% nebulizer solution Use 1 ampule in nebulizer 4 times a day or every 4 hours as needed to rescue asthma. (Patient taking differently: Take 2.5 mg by nebulization See admin instructions. Use 1 ampule in nebulizer 4 times a day or every 4 hours as needed to rescue asthma.)  . ALPRAZolam (XANAX) 0.5 MG tablet Take 1 tablet 3 x / day for Anxiety (Patient taking differently: Take 0.5 mg by mouth 2 (two) times daily. )  . aspirin 81 MG tablet Take 81 mg by mouth daily. Buys OTC  . budesonide-formoterol (SYMBICORT) 80-4.5 MCG/ACT inhaler Inhale 2 puffs into the lungs 2 (two) times daily.  . carbidopa-levodopa (SINEMET IR) 25-100 MG tablet Take 2 tablet at 9am and 2 tablets at 3pm. (Patient taking differently: Take 2 tablets by mouth 2 (two) times daily. Take 2 tablet at 9am and 2 tablets at 3pm.)  . Cholecalciferol (VITAMIN D) 2000 units CAPS Take 2,000 capsules by mouth daily.  Marland Kitchen doxycycline (VIBRAMYCIN) 100 MG capsule Take 1 capsule twice daily with food  . ENSURE (ENSURE) Take 237 mLs by mouth daily as needed.   . furosemide (LASIX) 20 MG tablet Take 20 mg by mouth daily.  Marland Kitchen omeprazole (PRILOSEC) 40 MG capsule Take 40 mg by mouth daily.   . OXYGEN Inhale 2 L into the lungs daily as needed (for shortness of breath). Setting is on 2 when needed   . potassium chloride SA  (K-DUR,KLOR-CON) 20 MEQ tablet Take 1 tablet by mouth daily.  . predniSONE (DELTASONE) 10 MG tablet Take 1 tablet (10 mg total) by mouth daily with breakfast. Take 1 tablet 3 x/day or as directed (Patient taking differently: Take 10 mg by mouth 2 (two) times daily. )  . sertraline (ZOLOFT) 25 MG tablet Take 1 tablet (25 mg total) by mouth daily.  . TRELEGY ELLIPTA 100-62.5-25 MCG/INH AEPB INHALE 1 PUFF ONCE DAILY   No current facility-administered medications on file prior to visit.     Problem list She has Tubulovillous adenoma of rectum; Incontinence; Hyperlipidemia; GERD (gastroesophageal reflux disease); Hereditary and idiopathic peripheral neuropathy; Essential hypertension; Unstable Gait; Prediabetes; Vitamin D deficiency; Medication management; At high risk for falls; Depression, major, in remission ([employer]); LVH (left ventricular hypertrophy); Restrictive lung disease; Parkinson's plus syndrome ([place] [place]); Protein-calorie malnutrition, severe; Hypertensive heart disease; Chronic respiratory failure with hypoxia, on home O2 therapy ([place]); Dysphagia; Aortic atherosclerosis ([place]); Problem with medical care compliance; Subvalvular aortic stenosis determined by imaging; Pedal edema; COPD with acute exacerbation ([facility]); Chronic diastolic CHF (congestive heart failure) ([place]); Anxiety; and Asthmatic bronchitis on their problem list.    Review of Systems     Objective:   Physical Exam  Constitutional: She is oriented to person, place, and time. No distress.  HENT:  Head: Normocephalic and atraumatic.  Mouth/Throat:  No oropharyngeal exudate.  dentures in place  Eyes: Conjunctivae are normal. No scleral icterus.  Neck: Normal range of motion. Neck supple. No JVD present. No thyromegaly present.  Cardiovascular: An irregular rhythm present. Exam reveals no gallop.  Murmur heard. Pulmonary/Chest: Effort normal and breath sounds normal. No respiratory distress. She has no wheezes. She has no rales. She  exhibits no tenderness.  Abdominal: Soft. Bowel sounds are normal. She exhibits no distension and no mass. There is no tenderness. There is no rebound and no guarding.  Musculoskeletal: Normal range of motion. She exhibits edema.  Left ankle with erythema, swelling, warmth at lateral mallelous with tenderness to palpation. Good ROM, good pulses. Neg homen's, + 2-3 + edema.   Lymphadenopathy:    She has no cervical adenopathy.  Neurological: She is alert and oriented to person, place, and time.  Skin: Skin is dry. No pallor.  Senile purpura  Psychiatric: She has a normal mood and affect. Her behavior is normal. Judgment and thought content normal.  Nursing note and vitals reviewed.       Assessment & Plan:   Needs flu shot -     Flu vaccine HIGH DOSE PF  Acute left ankle pain Some warmth and swelling- rule out cellulitis/gout Get xray rule out fracture Elevate, rest, continue lasix.  -     Uric acid -     CBC with Differential/Platelet -     DG Ankle Complete Left; Future -     meloxicam (MOBIC) 15 MG tablet; Take one daily with food for 2 weeks, can take with tylenol, can not take with aleve, iburpofen, then as needed daily for pain

## 2018-07-13 NOTE — Patient Instructions (Signed)
Mobic is an antiinflammatory It helps pain, can not take with aleve, or ibuprofen You can take tylenol (500mg ) or tylenol arthritis (650mg ) with the meloxicam/antiinflammatories. The max you can take of tylenol a day is 3000mg  daily, this is a max of 6 pills a day of the regular tyelnol (500mg ) or a max of 4 a day of the tylenol arthritis (650mg ) as long as no other medications you are taking contain tylenol.   Mobic can cause inflammation in your stomach and can cause ulcers or bleeding, this will look like black tarry stools Make sure you take your mobic with food Try not to take it daily, take AS needed Can take with zantac/pepcid   Ankle Pain Many things can cause ankle pain, including an injury to the area and overuse of the ankle.The ankle joint holds your body weight and allows you to move around. Ankle pain can occur on either side or the back of one ankle or both ankles. Ankle pain may be sharp and burning or dull and aching. There may be tenderness, stiffness, redness, or warmth around the ankle. Follow these instructions at home: Activity  Rest your ankle as told by your health care provider. Avoid any activities that cause ankle pain.  Do exercises as told by your health care provider.  Ask your health care provider if you can drive. Using a brace, a bandage, or crutches  If you were given a brace: ? Wear it as told by your health care provider. ? Remove it when you take a bath or a shower. ? Try not to move your ankle very much, but wiggle your toes from time to time. This helps to prevent swelling.  If you were given an elastic bandage: ? Remove it when you take a bath or a shower. ? Try not to move your ankle very much, but wiggle your toes from time to time. This helps to prevent swelling. ? Adjust the bandage to make it more comfortable if it feels too tight. ? Loosen the bandage if you have numbness or tingling in your foot or if your foot turns cold and blue.  If  you have crutches, use them as told by your health care provider. Continue to use them until you can walk without feeling pain in your ankle. Managing pain, stiffness, and swelling  Raise (elevate) your ankle above the level of your heart while you are sitting or lying down.  If directed, apply ice to the area: ? Put ice in a plastic bag. ? Place a towel between your skin and the bag. ? Leave the ice on for 20 minutes, 2-3 times per day. General instructions  Keep all follow-up visits as told by your health care provider. This is important.  Record this information that may be helpful for you and your health care provider: ? How often you have ankle pain. ? Where the pain is located. ? What the pain feels like.  Take over-the-counter and prescription medicines only as told by your health care provider. Contact a health care provider if:  Your pain gets worse.  Your pain is not relieved with medicines.  You have a fever or chills.  You are having more trouble with walking.  You have new symptoms. Get help right away if:  Your foot, leg, toes, or ankle tingles or becomes numb.  Your foot, leg, toes, or ankle becomes swollen.  Your foot, leg, toes, or ankle turns pale or blue. This information is not intended  to replace advice given to you by your health care provider. Make sure you discuss any questions you have with your health care provider. Document Released: 03/25/2010 Document Revised: 06/05/2016 Document Reviewed: 05/07/2015 Elsevier Interactive Patient Education  [employer].

## 2018-07-14 ENCOUNTER — Other Ambulatory Visit: Payer: Self-pay | Admitting: Physician Assistant

## 2018-07-14 MED ORDER — DOXYCYCLINE HYCLATE 100 MG PO CAPS: ORAL_CAPSULE | ORAL | 0 refills | Status: DC

## 2018-07-15 ENCOUNTER — Ambulatory Visit (INDEPENDENT_AMBULATORY_CARE_PROVIDER_SITE_OTHER): Payer: Medicare Other

## 2018-07-15 ENCOUNTER — Ambulatory Visit (INDEPENDENT_AMBULATORY_CARE_PROVIDER_SITE_OTHER): Payer: Medicare Other | Admitting: Orthopaedic Surgery

## 2018-07-15 DIAGNOSIS — M79672 Pain in left foot: Secondary | ICD-10-CM | POA: Diagnosis not present

## 2018-07-15 NOTE — Progress Notes (Signed)
Office Visit Note   Patient: Marie [name]           Date of Birth: Jan 07, 1936           MRN: [id number] Visit Date: 07/15/2018              Requested by: Vicie Mutters, PA-C [address] [place], [place] [address] PCP: [name], MD   Assessment & Plan: Visit Diagnoses:  1. Left foot pain     Plan: Impression is left foot contusion and left ankle fibula fracture.  Overall the fracture does not appear to be complete.  Recommend ASO brace for support and ambulation as tolerated.  Rest ice heat and elevation for the left foot.  Recheck in 4 weeks with three-view x-rays left ankle.  If she is doing well at that time anticipate releasing her at that time.  Follow-Up Instructions: Return in about 4 weeks (around 08/12/2018).   Orders:  Orders Placed This Encounter  Procedures  . XR Foot Complete Left   No orders of the defined types were placed in this encounter.     Procedures: No procedures performed   Clinical Data: No additional findings.   Subjective: Chief Complaint  Patient presents with  . Right Ankle - Pain    Marie [name] is coming in today for new injury to her left ankle.  She injured this within the last 1 to 2 weeks.  X-rays showed a nondisplaced fracture of the cortex of the lateral malleolus.  She is also complaining of left foot swelling and bruising.  This was not imaged.   Review of Systems  Constitutional: Negative.   HENT: Negative.   Eyes: Negative.   Respiratory: Negative.   Cardiovascular: Negative.   Endocrine: Negative.   Musculoskeletal: Negative.   Neurological: Negative.   Hematological: Negative.   Psychiatric/Behavioral: Negative.   All other systems reviewed and are negative.    Objective: Vital Signs: There were no vitals taken for this visit.  Physical Exam  Constitutional: She is oriented to person, place, and time. She appears well-developed and well-nourished.  Pulmonary/Chest: Effort normal.    Neurological: She is alert and oriented to person, place, and time.  Skin: Skin is warm. Capillary refill takes less than 2 seconds.  Psychiatric: She has a normal mood and affect. Her behavior is normal. Judgment and thought content normal.  Nursing note and vitals reviewed.   Ortho Exam Left foot and ankle exam shows swelling of the dorsum of the forefoot.  Neurovascularly intact.  There is ecchymosis in this area.  Fibula is not overly tender.  There is pitting edema.  She is status post partial third toe amputation. Specialty Comments:  No specialty comments available.  Imaging: Xr Foot Complete Left  Result Date: 07/15/2018 No acute or structural abnormalities    PMFS History: Patient Active Problem List   Diagnosis Date Noted  . Asthmatic bronchitis 01/17/2018  . COPD with acute exacerbation ([place]) 12/15/2017  . Chronic diastolic CHF (congestive heart failure) ([place]) 12/15/2017  . Anxiety 12/15/2017  . Subvalvular aortic stenosis determined by imaging 11/15/2017  . Pedal edema 11/15/2017  . Problem with medical care compliance 11/11/2017  . Aortic atherosclerosis ([facility] [place]) 10/19/2017  . Dysphagia 09/28/2017  . Chronic respiratory failure with hypoxia, on home O2 therapy ([facility]) 09/20/2017  . Hypertensive heart disease 03/29/2017  . Protein-calorie malnutrition, severe 03/17/2017  . Parkinson's plus syndrome ([place]) 03/04/2017  . Restrictive lung disease 02/24/2017  . LVH (left ventricular hypertrophy)  01/07/2017  . Depression, major, in remission ([facility]) 09/22/2016  . At high risk for falls 10/16/2015  . Prediabetes 08/23/2014  . Vitamin D deficiency 08/23/2014  . Medication management 08/23/2014  . Unstable Gait 01/30/2014  . Incontinence   . Hyperlipidemia   . GERD (gastroesophageal reflux disease)   . Hereditary and idiopathic peripheral neuropathy   . Essential hypertension   . Tubulovillous adenoma of rectum 09/04/2011   Past Medical History:  Diagnosis Date  .  Balance disorder 2008.     Falls a lot.  She can be standing and then leans too far over to one side   . Bulging disc   . Chronic bronchitis ([place])    due to congestion at times, on prednisone and advair  . COPD (chronic obstructive pulmonary disease) ([place])   . COPD with acute exacerbation ([facility]) 12/15/2017  . Depression    many years ago  . GERD (gastroesophageal reflux disease)   . Hearing loss    mild  . Hyperlipidemia   . Hypertension   . Iatrogenic adrenal insufficiency ([place])   . Incontinence    not indicated at this visit.  Marland Kitchen LVH (left ventricular hypertrophy) due to hypertensive disease    a. 02/2017: echo showing an EF of 65-70% with moderate LVH, hyperdynamic LV with subvalvular gradient of 70 mmHg, SAM, and mild MR  . Neuropathy    legs stay numb   . Osteoarthritis    hands,   . Osteoporosis   . Shortness of breath dyspnea   . Tubulovillous adenoma of rectum   . Wears dentures   . Wears glasses     Family History  Problem Relation Age of Onset  . Cancer Sister        pt unaware of what kind  . High blood pressure Mother   . COPD Mother   . Heart attack Father     Past Surgical History:  Procedure Laterality Date  . ABDOMINAL HYSTERECTOMY    . APPENDECTOMY    . CHONDROPLASTY  09/19/2014   Procedure: CHONDROPLASTY;  Surgeon: Alta Corning, MD;  Location: [facility];  Service: Orthopedics;;  . DILATION AND CURETTAGE OF UTERUS    . EXTERNAL FIXATION LEG  10/25/2012   Procedure: EXTERNAL FIXATION LEG;  Surgeon: Rozanna Box, MD;  Location: [facility];  Service: Orthopedics;  Laterality: Right;  . EYE SURGERY     bilateral cataract surgery and lens implant  . FINGER SURGERY     fusions and debridements for OA  . INCONTINENCE SURGERY     multiple procedures, not cured  . KNEE ARTHROSCOPY WITH LATERAL MENISECTOMY Right 09/19/2014   Procedure: KNEE ARTHROSCOPY WITH LATERAL MENISECTOMY;  Surgeon: Alta Corning, MD;  Location: [facility];   Service: Orthopedics;  Laterality: Right;  . KNEE ARTHROSCOPY WITH MEDIAL MENISECTOMY Right 09/19/2014   Procedure: RIGHT KNEE ARTHROSCOPY WITH MEDIAL AND LATERAL MENISECTOMIES. CHONDROPLASTY OF PATELLA-FEMORAL JOINT;  Surgeon: Alta Corning, MD;  Location: [facility];  Service: Orthopedics;  Laterality: Right;  . RECTAL BIOPSY  09/21/2011   Procedure: BIOPSY RECTAL;  Surgeon: Merrie Roof, MD;  Location: [facility];  Service: General;  Laterality: N/A;  3-4 cm  . RECTAL SURGERY     by dr. Marlou Starks, removal of polyp  . TONSILLECTOMY     Social History   Occupational History  . Not on file  Tobacco Use  . Smoking status: Never Smoker  . Smokeless tobacco:  Never Used  Substance and Sexual Activity  . Alcohol use: No  . Drug use: No  . Sexual activity: Not on file

## 2018-07-19 DIAGNOSIS — H02102 Unspecified ectropion of right lower eyelid: Secondary | ICD-10-CM | POA: Diagnosis not present

## 2018-07-19 DIAGNOSIS — H524 Presbyopia: Secondary | ICD-10-CM | POA: Diagnosis not present

## 2018-07-19 DIAGNOSIS — H5213 Myopia, bilateral: Secondary | ICD-10-CM | POA: Diagnosis not present

## 2018-07-19 DIAGNOSIS — H52223 Regular astigmatism, bilateral: Secondary | ICD-10-CM | POA: Diagnosis not present

## 2018-08-11 ENCOUNTER — Ambulatory Visit (INDEPENDENT_AMBULATORY_CARE_PROVIDER_SITE_OTHER): Payer: Medicare Other | Admitting: Orthopaedic Surgery

## 2018-08-12 ENCOUNTER — Ambulatory Visit (INDEPENDENT_AMBULATORY_CARE_PROVIDER_SITE_OTHER): Payer: Medicare Other | Admitting: Neurology

## 2018-08-12 ENCOUNTER — Other Ambulatory Visit: Payer: Self-pay | Admitting: *Deleted

## 2018-08-12 ENCOUNTER — Telehealth: Payer: Self-pay | Admitting: *Deleted

## 2018-08-12 ENCOUNTER — Ambulatory Visit (INDEPENDENT_AMBULATORY_CARE_PROVIDER_SITE_OTHER): Payer: Medicare Other | Admitting: Orthopaedic Surgery

## 2018-08-12 ENCOUNTER — Ambulatory Visit (INDEPENDENT_AMBULATORY_CARE_PROVIDER_SITE_OTHER): Payer: Self-pay

## 2018-08-12 ENCOUNTER — Encounter: Payer: Self-pay | Admitting: Neurology

## 2018-08-12 VITALS — BP 110/64 | HR 81 | Ht 64.0 in | Wt 162.5 lb

## 2018-08-12 DIAGNOSIS — R1310 Dysphagia, unspecified: Secondary | ICD-10-CM

## 2018-08-12 DIAGNOSIS — G232 Striatonigral degeneration: Secondary | ICD-10-CM | POA: Diagnosis not present

## 2018-08-12 DIAGNOSIS — R270 Ataxia, unspecified: Secondary | ICD-10-CM

## 2018-08-12 DIAGNOSIS — M79672 Pain in left foot: Secondary | ICD-10-CM

## 2018-08-12 DIAGNOSIS — G629 Polyneuropathy, unspecified: Secondary | ICD-10-CM

## 2018-08-12 DIAGNOSIS — G20C Parkinsonism, unspecified: Secondary | ICD-10-CM

## 2018-08-12 DIAGNOSIS — R471 Dysarthria and anarthria: Secondary | ICD-10-CM

## 2018-08-12 DIAGNOSIS — M21372 Foot drop, left foot: Secondary | ICD-10-CM

## 2018-08-12 NOTE — Telephone Encounter (Signed)
Referral sent for PT and Nursing.

## 2018-08-12 NOTE — Progress Notes (Signed)
FOLLOW UP  Assessment and Plan:   Hypertension Continue medication Monitor blood pressure at home; call if consistently over 130/80 Continue DASH diet.   Reminder to go to the ER if any CP, SOB, nausea, dizziness, severe HA, changes vision/speech, left arm numbness and tingling and jaw pain.  Chronic diastolic CHF (congestive heart failure) (HCC) 2+ pitting edema bilaterally with ? Weight gain, increase lasix to 40 mg for 5 days, call back on 4-5th day to report status, will follow up closely next week due to limited health literacy and frailty Emphasized salt restriction, less than 2000mg  a day. Encouraged daily monitoring of the patient's weight, call office if 3 lb weight loss or gain in a day.  Encouraged regular exercise. If any increasing shortness of breath, swelling, or chest pressure go to ER immediately.  decrease your fluid intake to less than 2 L daily  Chronic respiratory failure with hypoxia, on home O2 therapy (HCC) Continue inhalers, symptoms stable today at office, off of O2 and doing well   Follow up with pulmonology as recommended Discussed appropriate medication use  Protein-calorie malnutrition, severe Continue with ensure  Depression, major, in remission ([place]) with anxiety Continue medications; xanax PRN only -  Lifestyle discussed: diet/exerise, sleep hygiene, stress management, hydration  Gastroesophageal reflux disease without esophagitis Well managed on current medications Discussed diet, avoiding triggers and other lifestyle changes  Hypertension Well controlled with current medications  Monitor blood pressure at home; patient to call if consistently greater than 130/80 Continue DASH diet.   Reminder to go to the ER if any CP, SOB, nausea, dizziness, severe HA, changes vision/speech, left arm numbness and tingling and jaw pain.  Cholesterol Currently at goal Continue low cholesterol diet and exercise.  Defer checking lipid panel.    Prediabetes Discussed disease and risks Discussed diet/exercise, weight management  A1C  Vitamin D Def Below goal at last visit; was increased to 4000 IU daily for goal of 70-100  Check Vit D level  Continue diet and meds as discussed. Further disposition pending results of labs. Discussed med's effects and SE's.   Over 30 minutes of exam, counseling, chart review, and critical decision making was performed.   Future Appointments  Date Time Provider [place]  09/23/2018  9:00 AM Leandrew Koyanagi, MD [facility] None  11/16/2018 10:30 AM [name], MD [facility] None  12/14/2018  8:50 AM Alda Berthold, DO [facility] None  06/06/2019  2:00 PM [name], MD [facility] None    ----------------------------------------------------------------------------------------------------------------------  HPI 82 y.o. female  Presents accompanied by her husband for 3 month follow up on management of chronic medical conditions. She reports she slid off the toilet and now has a small fracture of the left foot, in brace and followed by ortho.   She has hx of restrictive Lung Dz and Parkinson's (+) (followe by Dr. Jannifer Franklin and Dr. Posey Pronto for probable progressive supranuclear palsy, with mild Dementia and limited insight has has numerous ER visits for acute dyspnea compounding her moderately severe Restrictive Lung Disease when she panics . It has been long suspected that there is a large component of poor compliance in medication regimen of her inhalent bronchodilator therapy and husband likewise seems oblivious to her med dosing schedules and of little help in assessing her compliance, but daughter has been helping with medications, has been placed on trellegy and she has done well this past summer without ER visits. Has PRN nebulized albuterol but hasn't needed in several months, no longer needing home  O2, reports runs mid 90s at home.  She is reportedly eating well, takes medications without  notable problems, and having daily BMs.   she has a diagnosis of GERD which is currently managed by prilosec 40 mg daily she reports symptoms is currently well controlled, and denies breakthrough reflux, burning in chest, hoarseness or cough.    she has a diagnosis of depression/anxiety and is currently on zoloft 25 mg, xanax PRN, reports symptoms are well controlled on current regimen. she reports she hasn't needed xanax recently.   She has a history of Diastolic CHF, LVH but with EF of 65-70% at last echo in 2018, denies dyspnea on exertion, orthopnea and paroxysmal nocturnal dyspnea. She does having pitting edema of bilateral extremities consistent with baseline per patient/family. She denies chest pain, fatigue, cough.  Wt Readings from Last 3 Encounters:  08/15/18 154 lb (69.9 kg)  08/12/18 162 lb 8 oz (73.7 kg)  07/13/18 151 lb 9.6 oz (68.8 kg)   Her blood pressure has been controlled at home, today their BP is BP: 116/72  She does not workout. She denies chest pain, shortness of breath, dizziness.   She is not on cholesterol medication and denies myalgias. Her cholesterol is at goal. The cholesterol last visit was:   Lab Results  Component Value Date   CHOL 173 05/12/2018   HDL 75 05/12/2018   LDLCALC 77 05/12/2018   TRIG 133 05/12/2018   CHOLHDL 2.3 05/12/2018   She has not been watching her diet, denies parasthesias, blurry vision, excessive thirst. Last A1C in the office was:  Lab Results  Component Value Date   HGBA1C 6.1 (H) 05/12/2018   Patient is on Vitamin D supplement but remained well below goal at recent check:    Lab Results  Component Value Date   VD25OH 35 05/12/2018        Current Medications:  Current Outpatient Medications on File Prior to Visit  Medication Sig  . albuterol (PROVENTIL) (2.5 MG/3ML) 0.083% nebulizer solution Use 1 ampule in nebulizer 4 times a day or every 4 hours as needed to rescue asthma. (Patient taking differently: Take 2.5 mg by  nebulization See admin instructions. Use 1 ampule in nebulizer 4 times a day or every 4 hours as needed to rescue asthma.)  . ALPRAZolam (XANAX) 0.5 MG tablet Take 1 tablet 3 x / day for Anxiety (Patient taking differently: Take 0.5 mg by mouth 2 (two) times daily. )  . aspirin 81 MG tablet Take 81 mg by mouth daily. Buys OTC  . carbidopa-levodopa (SINEMET IR) 25-100 MG tablet Take 2 tablet at 9am and 2 tablets at 3pm. (Patient taking differently: Take 2 tablets by mouth 2 (two) times daily. Take 2 tablet at 9am and 2 tablets at 3pm.)  . Cholecalciferol (VITAMIN D) 2000 units CAPS Take 2,000 capsules by mouth daily.  Marland Kitchen ENSURE (ENSURE) Take 237 mLs by mouth daily as needed.   . furosemide (LASIX) 20 MG tablet Take 20 mg by mouth daily.  Marland Kitchen omeprazole (PRILOSEC) 40 MG capsule Take 40 mg by mouth daily.   . OXYGEN Inhale 2 L into the lungs daily as needed (for shortness of breath). Setting is on 2 when needed   . potassium chloride SA (K-DUR,KLOR-CON) 20 MEQ tablet Take 1 tablet by mouth daily.  . predniSONE (DELTASONE) 10 MG tablet Take 1 tablet (10 mg total) by mouth daily with breakfast. Take 1 tablet 3 x/day or as directed (Patient taking differently: Take 10 mg by  mouth 2 (two) times daily. )  . sertraline (ZOLOFT) 25 MG tablet Take 1 tablet (25 mg total) by mouth daily.  . TRELEGY ELLIPTA 100-62.5-25 MCG/INH AEPB INHALE 1 PUFF ONCE DAILY   No current facility-administered medications on file prior to visit.      Allergies:  Allergies  Allergen Reactions  . Demerol  [Meperidine Hcl] Other (See Comments)    Pt. Feels like she is suffocating  . Ertapenem Hives    Caused whole body to turn red Other reaction(s): Redness Caused whole body to turn red  . Codeine Other (See Comments)    hallucinations  . Demerol Other (See Comments)    hallucinations  . Morphine And Related Other (See Comments)    hallucinations  . Other Other (See Comments)    Invan 7- pt became red all over  . Sulfate  Other (See Comments)    unknown     Medical History:  Past Medical History:  Diagnosis Date  . Balance disorder 2008.     Falls a lot.  She can be standing and then leans too far over to one side   . Bulging disc   . Chronic bronchitis ([place])    due to congestion at times, on prednisone and advair  . COPD (chronic obstructive pulmonary disease) ([place])   . COPD with acute exacerbation ([place]) 12/15/2017  . Depression    many years ago  . GERD (gastroesophageal reflux disease)   . Hearing loss    mild  . Hyperlipidemia   . Hypertension   . Iatrogenic adrenal insufficiency ([place])   . Incontinence    not indicated at this visit.  Marland Kitchen LVH (left ventricular hypertrophy) due to hypertensive disease    a. 02/2017: echo showing an EF of 65-70% with moderate LVH, hyperdynamic LV with subvalvular gradient of 70 mmHg, SAM, and mild MR  . Neuropathy    legs stay numb   . Osteoarthritis    hands,   . Osteoporosis   . Shortness of breath dyspnea   . Tubulovillous adenoma of rectum   . Wears dentures   . Wears glasses    Family history- Reviewed and unchanged Social history- Reviewed and unchanged   Review of Systems:  Review of Systems  Constitutional: Negative for chills, diaphoresis, fever, malaise/fatigue and weight loss.  HENT: Negative for congestion, hearing loss, sore throat and tinnitus.   Eyes: Negative for blurred vision and double vision.  Respiratory: Negative for cough, sputum production, shortness of breath and wheezing.   Cardiovascular: Negative for chest pain, palpitations, orthopnea, claudication and leg swelling.  Gastrointestinal: Negative for abdominal pain, blood in stool, constipation, diarrhea, heartburn, melena, nausea and vomiting.  Genitourinary: Negative.   Musculoskeletal: Negative for falls, joint pain and myalgias.  Skin: Negative for rash.  Neurological: Negative for dizziness, tingling, sensory change, speech change, focal weakness, weakness and headaches.   Endo/Heme/Allergies: Negative for polydipsia.  Psychiatric/Behavioral: Negative.   All other systems reviewed and are negative.   Physical Exam: BP 116/72   Pulse 75   Temp (!) 96.8 F (36 C)   Ht 5\' 4"  (1.626 m)   Wt 154 lb (69.9 kg)   SpO2 97%   BMI 26.43 kg/m  Wt Readings from Last 3 Encounters:  08/15/18 154 lb (69.9 kg)  08/12/18 162 lb 8 oz (73.7 kg)  07/13/18 151 lb 9.6 oz (68.8 kg)   General Appearance: Well nourished, appears fatigued/not at known baseline, in no acute distress. Eyes: PERRLA, EOMs, conjunctiva  no swelling or erythema Sinuses: No Frontal/maxillary tenderness ENT/Mouth: Ext aud canals clear, TMs without erythema, bulging. No erythema, swelling, or exudate on post pharynx.  Tonsils not swollen or erythematous. Hearing normal.  Neck: Supple, thyroid normal.   Respiratory: Respiratory effort normal, expansion limited somewhat, BS equal bilaterally without rales, rhonchi, wheezing or stridor.  Cardio: RRR with 4/6 blowing systolic murmur. R extremity pulses 1+, left ankle in brace, with bilateral pitting edema 2+ to bilateral lower extremities.  Abdomen: Soft, + BS.  Non tender, no guarding, rebound, hernias, masses. Lymphatics: Non tender without lymphadenopathy.  Musculoskeletal: Symmetrical strength, weak,  Limited to wheelchair. Left ankle in brace.  Skin: Visible skin Warm, dry, very thin/fragile, no distinct lesions, ecchymosis or rash Psych: Awake and oriented X 3, normal affect, Insight and Judgment questionable.   Izora Ribas, NP 11:25 AM [facility]

## 2018-08-12 NOTE — Telephone Encounter (Signed)
-----   Message from Alda Berthold, DO sent at 08/12/2018 11:38 AM EDT ----- Please order home PT (gait training) and nursing care (med management). Thanks.

## 2018-08-12 NOTE — Patient Instructions (Addendum)
Continue medications as you are taking  Return to clinic in 4 months

## 2018-08-12 NOTE — Progress Notes (Signed)
Follow-up Visit   Date: 08/12/18    Marie [name] MRN: [id number] DOB: 06/16/1936   Interim History: Marie [name] is a 82 y.o. 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   History of present illness: Starting in around the fall 2017, she began having difficulty swallowing, which is worse with liquids.  She had difficulty initiating swallowing and had MBS which showed markedly delayed oral transit.  There has been 30lb unintentional weight loss. She also complains of difficulty with speech production, but husband has not noticed slurred speech. She does not feel that symptoms are progressing, but she is concerned that they are not improving. She denies double vision, droopy eyelids, or weakness of the arms and legs.  She is short of breath, especially when eating. She sleeps on a wedge for the past few years because of acid reflux.  She has been using a walker during the same time because of imbalance.  She denies numbness/burning/tingling of the legs.  She saw Dr Marie [name] in 2009 at [facility], but does not recall the reason for her visit.  For her wheezing, has been evaluated by Dr. [place] [name] whose PFTs were suggestive of restrictive pattern and therefore referred to Dr. Redmond [name], ENT, to evaluate for upper airway obstruction, which was not present on his exam.  She also has echocardiogram which was notable for grade 1 diastolic dysfunction.  She has been treated for COPD and URI with several courses of antibiotics and prednisone.  She denies noticing any benefit with respect to her speech or swallow function while being on prednisone.   In May 2018, she underwent NCS/EMG which showed no evidence of a neuromuscular junction disorder, myopathy, or disorder of anterior horn cells on EMG. She has neuropathy and mild bulbar neurogenic changes. Labs including  screening for myopathy, vitamin B12 deficiency, and myasthenia gravis also returned normal.  Exam showed new left hand tremor which raised my concern for parkinson-plus syndrome, such as PSP, and was started on sinemet which helped her movements.    UPDATE 03/04/2018:  She is here for follow-up visit with daughter and husband. Family has noticed new chin tremor and ongoing left hand tremor at rest.  Neither tremor prohibits patient from her usual activities.  She is able to bathe, dress, and feed herself.  She always uses a rollator to walk and daughter has noticed that her gait, especially left leg seems to be shuffling. No interval falls.  She had two hospitalizations for COPD exacerbation and pneurmonia.  Weight is doing much better, she has gained 15lb+.   UPDATE 08/12/2018:  She is here for follow-up visit with daughter and husband.  She slid off her toilet and suffered a small fracture on in the foot, which is being followed by orthopeadics.  Overall, family feels that she is doing fair.  She requires assistance with transfers, and needs some help with bathing and dressing. Her husband usually picks up meals from [employer], they do not cook at home.  She does not have problems with swallowing and has gained weight, some of which is due to being on prednisone.  No new complaints.   Medications:  Current Outpatient Medications on File Prior to Visit  Medication Sig Dispense Refill  . albuterol (PROVENTIL) (2.5 MG/3ML) 0.083% nebulizer solution Use 1 ampule in nebulizer 4 times a day or every 4 hours as needed to rescue asthma. (Patient taking  differently: Take 2.5 mg by nebulization See admin instructions. Use 1 ampule in nebulizer 4 times a day or every 4 hours as needed to rescue asthma.) 360 mL 3  . ALPRAZolam (XANAX) 0.5 MG tablet Take 1 tablet 3 x / day for Anxiety (Patient taking differently: Take 0.5 mg by mouth 2 (two) times daily. ) 90 tablet 2  . aspirin 81 MG tablet Take 81 mg by  mouth daily. Buys OTC    . carbidopa-levodopa (SINEMET IR) 25-100 MG tablet Take 2 tablet at 9am and 2 tablets at 3pm. (Patient taking differently: Take 2 tablets by mouth 2 (two) times daily. Take 2 tablet at 9am and 2 tablets at 3pm.) 360 tablet 3  . Cholecalciferol (VITAMIN D) 2000 units CAPS Take 2,000 capsules by mouth daily.    Marland Kitchen ENSURE (ENSURE) Take 237 mLs by mouth daily as needed.     . furosemide (LASIX) 20 MG tablet Take 20 mg by mouth daily.    Marland Kitchen omeprazole (PRILOSEC) 40 MG capsule Take 40 mg by mouth daily.     . OXYGEN Inhale 2 L into the lungs daily as needed (for shortness of breath). Setting is on 2 when needed     . potassium chloride SA (K-DUR,KLOR-CON) 20 MEQ tablet Take 1 tablet by mouth daily. 90 tablet 1  . predniSONE (DELTASONE) 10 MG tablet Take 1 tablet (10 mg total) by mouth daily with breakfast. Take 1 tablet 3 x/day or as directed (Patient taking differently: Take 10 mg by mouth 2 (two) times daily. ) 270 tablet 1  . sertraline (ZOLOFT) 25 MG tablet Take 1 tablet (25 mg total) by mouth daily. 90 tablet 1  . TRELEGY ELLIPTA 100-62.5-25 MCG/INH AEPB INHALE 1 PUFF ONCE DAILY  0   No current facility-administered medications on file prior to visit.     Allergies:  Allergies  Allergen Reactions  . Demerol  [Meperidine Hcl] Other (See Comments)    Pt. Feels like she is suffocating  . Ertapenem Hives    Caused whole body to turn red Other reaction(s): Redness Caused whole body to turn red  . Codeine Other (See Comments)    hallucinations  . Demerol Other (See Comments)    hallucinations  . Morphine And Related Other (See Comments)    hallucinations  . Other Other (See Comments)    Invan 7- pt became red all over  . Sulfate Other (See Comments)    unknown    Review of Systems:  CONSTITUTIONAL: No fevers, chills, night sweats, or weight loss.  EYES: No visual changes or eye pain ENT: No hearing changes.  No history of nose bleeds.   RESPIRATORY: No cough or  shortness of breath.   CARDIOVASCULAR: Negative for chest pain, and palpitations.   GI: Negative for abdominal discomfort, blood in stools or black stools.  No recent change in bowel habits.   GU:  No history of incontinence.   MUSCLOSKELETAL: +history of joint pain or swelling.  No myalgias.   SKIN: Negative for lesions, rash, and itching.   ENDOCRINE: Negative for cold or heat intolerance, polydipsia or goiter.   PSYCH:  No depression or anxiety symptoms.   NEURO: As Above.   Vital Signs:  BP 110/64   Pulse 81   Ht 5\' 4"  (1.626 m)   Wt 162 lb 8 oz (73.7 kg)   SpO2 97%   BMI 27.89 kg/m   General:  Thin-appearing, comfortable. Startled facial expression.  Mild cushingoid features of the  face  Neurological Exam: MENTAL STATUS including orientation to time, place, person, recent and remote memory, attention span and concentration, language, and fund of knowledge is normal.  Speech is slow, mild dysarthria (stable).  CRANIAL NERVES:  Pupils equal round and reactive to light.  Restricted upgaze bilaterally, otherwise normal conjugate, extra-ocular eye movements.  No ptosis. Face is symmetric. Facial muscles are 5/5.  Resting chin tremor of moderate frequency and amplitude.  MOTOR:  Motor strength is 5/5 in all extremities, except interosseus muscles of the hands which is 5-/5 and left dorsiflexion, eversion, and inversion (improved)  There is generalized loss of muscle bulk with atrophy of the intrinsic hand muscles. Intermittent left hand tremor is observed.  Tone is slightly improved in the LUE, improved in the RUE.     COORDINATION/GAIT:  Normal finger-to- nose-finger.  Severe bradykinesia with reduced amplitude and rate of finger and toe tapping on the left >. right.  Gait is stooped, ataxic, dragging of the both feet with small steps and appears unsteady, requiring one person assist to keep her up even with using a rollator. Due to the severity of unsteadiness, I asked her to sit in her  rollator and allow to family pushed her.   Data: Labs 01/01/2017:  CK 36, AChR antibody negative, aldolase 6.3, vitamin B12 503  NCS/EMG of the right side 03/02/2017: 1. There is evidence of a length dependent sensorimotor polyneuropathy, axon loss in type, affecting the upper and lower extremities; moderate in degree electrically. 2. Chronic motor axon loss changes seen in the bulbar muscles. In isolation, these findings are of unclear clinical significance. Correlate clinically. 3. There is no evidence of a neuromuscular junction disorder, widespread disorder of anterior horn cells, or cervical/lumbosacral radiculopathy affecting the right side.   IMPRESSION/PLAN: 1.  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  Symptoms manifesting with dysarthria, dysphasia, and exam showing asymmetrical left-sided tremor, bradykinesia, and gait unsteadiness.  Her gait appears significantly worse with marked unsteadiness and small steps, favoring the left side.   Because of her high risk of falls, I have asked family to start using a wheelchair more, as her rollator is not providing adequate support. Continue sinemet 25/100 to 2 tablet twice daily (9am and 3pm), which is not always compliant with Referral for home PT for gait and balance training and nursing care for medication management   2.  Left foot weakness, still with residual weakness but improved as compared to her previous visit.  She does not want any aggressive treatment so it was opted not to obtain MRI lumbar spine.  Start home PT.  3.  Possibly due to cervical canal stenosis (generalized hyperreflexia). She denies any arm weakness or paresthesias.   4  Idiopathic peripheral neuropathy, with numbness and gait ataxia.  Fall precautions discussed.  Transition to using wheelchair more.   5.  Advanced care planning and counseling. Patient has already established Living Will, Advanced Directives, and is DNR.  Return to  clinic in 4 months  Greater than 50% of this 25 minute visit was spent in counseling, explanation of diagnosis, planning of further management, and coordination of care.   Thank you for allowing me to participate in patient's care.  If I can answer any additional questions, I would be pleased to do so.    Sincerely,    Donika K. Posey Pronto, DO

## 2018-08-12 NOTE — Progress Notes (Signed)
Patient: Marie [name]           Date of Birth: Oct 15, 1936           MRN: [id number] Visit Date: 08/12/2018 PCP: [name], MD   Assessment & Plan:  Chief Complaint:  Chief Complaint  Patient presents with  . Left Ankle - Follow-up   Visit Diagnoses:  1. Left foot pain     Plan: Marie [name] follows up today for her fibula fracture.  She reports no pain.  She is approximately 6 weeks from injury.  She is ambulating with a Rollator.  She is currently wearing an ASO brace.  Her x-rays demonstrate healing of the fracture.  I did recommend physical therapy but she declined.  We will have her follow-up in another 6 weeks with three-view x-rays of the left ankle.  For now continue with weightbearing and exercises for ankle strengthening.  Follow-Up Instructions: Return in about 6 weeks (around 09/23/2018).   Orders:  Orders Placed This Encounter  Procedures  . XR Ankle Complete Left   No orders of the defined types were placed in this encounter.   Imaging: Xr Ankle Complete Left  Result Date: 08/12/2018 Healed fibular shaft fracture.  Generalized osteopenia.   PMFS History: Patient Active Problem List   Diagnosis Date Noted  . Asthmatic bronchitis 01/17/2018  . COPD with acute exacerbation ([place]) 12/15/2017  . Chronic diastolic CHF (congestive heart failure) ([place]) 12/15/2017  . Anxiety 12/15/2017  . Subvalvular aortic stenosis determined by imaging 11/15/2017  . Pedal edema 11/15/2017  . Problem with medical care compliance 11/11/2017  . Aortic atherosclerosis ([place]) 10/19/2017  . Dysphagia 09/28/2017  . Chronic respiratory failure with hypoxia, on home O2 therapy ([place]) 09/20/2017  . Hypertensive heart disease 03/29/2017  . Protein-calorie malnutrition, severe 03/17/2017  . Parkinson's plus syndrome ([place]) 03/04/2017  . Restrictive lung disease 02/24/2017  . LVH (left ventricular hypertrophy) 01/07/2017  . Depression, major, in remission ([place]) 09/22/2016  .  At high risk for falls 10/16/2015  . Prediabetes 08/23/2014  . Vitamin D deficiency 08/23/2014  . Medication management 08/23/2014  . Unstable Gait 01/30/2014  . Incontinence   . Hyperlipidemia   . GERD (gastroesophageal reflux disease)   . Hereditary and idiopathic peripheral neuropathy   . Essential hypertension   . Tubulovillous adenoma of rectum 09/04/2011   Past Medical History:  Diagnosis Date  . Balance disorder 2008.     Falls a lot.  She can be standing and then leans too far over to one side   . Bulging disc   . Chronic bronchitis ([place])    due to congestion at times, on prednisone and advair  . COPD (chronic obstructive pulmonary disease) ([place])   . COPD with acute exacerbation ([place]) 12/15/2017  . Depression    many years ago  . GERD (gastroesophageal reflux disease)   . Hearing loss    mild  . Hyperlipidemia   . Hypertension   . Iatrogenic adrenal insufficiency ([place])   . Incontinence    not indicated at this visit.  Marland Kitchen LVH (left ventricular hypertrophy) due to hypertensive disease    a. 02/2017: echo showing an EF of 65-70% with moderate LVH, hyperdynamic LV with subvalvular gradient of 70 mmHg, SAM, and mild MR  . Neuropathy    legs stay numb   . Osteoarthritis    hands,   . Osteoporosis   . Shortness of breath dyspnea   . Tubulovillous adenoma of rectum   .  Wears dentures   . Wears glasses     Family History  Problem Relation Age of Onset  . Cancer Sister        pt unaware of what kind  . High blood pressure Mother   . COPD Mother   . Heart attack Father     Past Surgical History:  Procedure Laterality Date  . ABDOMINAL HYSTERECTOMY    . APPENDECTOMY    . CHONDROPLASTY  09/19/2014   Procedure: CHONDROPLASTY;  Surgeon: Alta Corning, MD;  Location: [facility];  Service: Orthopedics;;  . DILATION AND CURETTAGE OF UTERUS    . EXTERNAL FIXATION LEG  10/25/2012   Procedure: EXTERNAL FIXATION LEG;  Surgeon: Rozanna Box, MD;  Location: [facility];  Service: Orthopedics;  Laterality: Right;  . EYE SURGERY     bilateral cataract surgery and lens implant  . FINGER SURGERY     fusions and debridements for OA  . INCONTINENCE SURGERY     multiple procedures, not cured  . KNEE ARTHROSCOPY WITH LATERAL MENISECTOMY Right 09/19/2014   Procedure: KNEE ARTHROSCOPY WITH LATERAL MENISECTOMY;  Surgeon: Alta Corning, MD;  Location: [facility];  Service: Orthopedics;  Laterality: Right;  . KNEE ARTHROSCOPY WITH MEDIAL MENISECTOMY Right 09/19/2014   Procedure: RIGHT KNEE ARTHROSCOPY WITH MEDIAL AND LATERAL MENISECTOMIES. CHONDROPLASTY OF PATELLA-FEMORAL JOINT;  Surgeon: Alta Corning, MD;  Location: [facility];  Service: Orthopedics;  Laterality: Right;  . RECTAL BIOPSY  09/21/2011   Procedure: BIOPSY RECTAL;  Surgeon: Merrie Roof, MD;  Location: [facility];  Service: General;  Laterality: N/A;  3-4 cm  . RECTAL SURGERY     by dr. Marlou Starks, removal of polyp  . TONSILLECTOMY     Social History   Occupational History  . Not on file  Tobacco Use  . Smoking status: Never Smoker  . Smokeless tobacco: Never Used  Substance and Sexual Activity  . Alcohol use: No  . Drug use: No  . Sexual activity: Not on file

## 2018-08-15 ENCOUNTER — Encounter: Payer: Self-pay | Admitting: Adult Health

## 2018-08-15 ENCOUNTER — Ambulatory Visit (INDEPENDENT_AMBULATORY_CARE_PROVIDER_SITE_OTHER): Payer: Medicare Other | Admitting: Adult Health

## 2018-08-15 VITALS — BP 116/72 | HR 75 | Temp 96.8°F | Ht 64.0 in | Wt 154.0 lb

## 2018-08-15 DIAGNOSIS — J9611 Chronic respiratory failure with hypoxia: Secondary | ICD-10-CM

## 2018-08-15 DIAGNOSIS — Z79899 Other long term (current) drug therapy: Secondary | ICD-10-CM

## 2018-08-15 DIAGNOSIS — I10 Essential (primary) hypertension: Secondary | ICD-10-CM | POA: Diagnosis not present

## 2018-08-15 DIAGNOSIS — E559 Vitamin D deficiency, unspecified: Secondary | ICD-10-CM

## 2018-08-15 DIAGNOSIS — K219 Gastro-esophageal reflux disease without esophagitis: Secondary | ICD-10-CM

## 2018-08-15 DIAGNOSIS — G232 Striatonigral degeneration: Secondary | ICD-10-CM

## 2018-08-15 DIAGNOSIS — E782 Mixed hyperlipidemia: Secondary | ICD-10-CM

## 2018-08-15 DIAGNOSIS — I5032 Chronic diastolic (congestive) heart failure: Secondary | ICD-10-CM

## 2018-08-15 DIAGNOSIS — E43 Unspecified severe protein-calorie malnutrition: Secondary | ICD-10-CM | POA: Diagnosis not present

## 2018-08-15 DIAGNOSIS — F325 Major depressive disorder, single episode, in full remission: Secondary | ICD-10-CM

## 2018-08-15 DIAGNOSIS — F419 Anxiety disorder, unspecified: Secondary | ICD-10-CM

## 2018-08-15 DIAGNOSIS — R7303 Prediabetes: Secondary | ICD-10-CM

## 2018-08-15 DIAGNOSIS — Z9981 Dependence on supplemental oxygen: Secondary | ICD-10-CM

## 2018-08-15 DIAGNOSIS — J984 Other disorders of lung: Secondary | ICD-10-CM

## 2018-08-15 MED ORDER — FUROSEMIDE 20 MG PO TABS: ORAL_TABLET | ORAL | 1 refills | Status: DC

## 2018-08-15 NOTE — Patient Instructions (Signed)
Please take 2 tabs of lasix (40 mg) for 3-5 days then resume previous 1 tab daily dose. In the future please increase to 40 mg if you notice swelling like today, and call the office. Please remember to take 1 tab of potassium for every tab of lasix that you take.     Do the following things EVERYDAY: 1) Weigh yourself in the morning before breakfast or at the same time every day. Write it down and keep it in a log. 2) Take your medicines as prescribed 3) Eat low salt foods-Limit salt (sodium) to 2000 mg per day. Best thing to do is avoid processed foods.   4) Stay as active as you can everyday 5) Limit all fluids for the day to less than 1.5 liters  Call your doctor if:  Anytime you have any of the following symptoms:  1) 2 pound weight gain in 24 hours or 5 pounds in 1 week  2) shortness of breath, with or without a dry hacking cough  3) swelling in the hands, LEGs, feet or stomach  4) if you have to sleep on extra pillows at night in order to breathe. 5) after laying down at night for 20-30 mins, you wake up short of breath.   These can all be signs of fluid overload.     Edema Edema is when you have too much fluid in your body or under your skin. Edema may make your legs, feet, and ankles swell up. Swelling is also common in looser tissues, like around your eyes. This is a common condition. It gets more common as you get older. There are many possible causes of edema. Eating too much salt (sodium) and being on your feet or sitting for a long time can cause edema in your legs, feet, and ankles. Hot weather may make edema worse. Edema is usually painless. Your skin may look swollen or shiny. Follow these instructions at home:  Keep the swollen body part raised (elevated) above the level of your heart when you are sitting or lying down.  Do not sit still or stand for a long time.  Do not wear tight clothes. Do not wear garters on your upper legs.  Exercise your legs. This can help  the swelling go down.  Wear elastic bandages or support stockings as told by your doctor.  Eat a low-salt (low-sodium) diet to reduce fluid as told by your doctor.  Depending on the cause of your swelling, you may need to limit how much fluid you drink (fluid restriction).  Take over-the-counter and prescription medicines only as told by your doctor. Contact a doctor if:  Treatment is not working.  You have heart, liver, or kidney disease and have symptoms of edema.  You have sudden and unexplained weight gain. Get help right away if:  You have shortness of breath or chest pain.  You cannot breathe when you lie down.  You have pain, redness, or warmth in the swollen areas.  You have heart, liver, or kidney disease and get edema all of a sudden.  You have a fever and your symptoms get worse all of a sudden. Summary  Edema is when you have too much fluid in your body or under your skin.  Edema may make your legs, feet, and ankles swell up. Swelling is also common in looser tissues, like around your eyes.  Raise (elevate) the swollen body part above the level of your heart when you are sitting or lying down.  Follow your doctor's instructions about diet and how much fluid you can drink (fluid restriction). This information is not intended to replace advice given to you by your health care provider. Make sure you discuss any questions you have with your health care provider. Document Released: 03/23/2008 Document Revised: 10/23/2016 Document Reviewed: 10/23/2016 Elsevier Interactive Patient Education  2017 [employer].    Heart Failure Heart failure means your heart has trouble pumping blood. This makes it hard for your body to work well. Heart failure is usually a long-term (chronic) condition. You must take good care of yourself and follow your doctor's treatment plan. Follow these instructions at home:  Take your heart medicine as told by your doctor. ? Do not stop  taking medicine unless your doctor tells you to. ? Do not skip any dose of medicine. ? Refill your medicines before they run out. ? Take other medicines only as told by your doctor or pharmacist.  Stay active if told by your doctor. The elderly and people with severe heart failure should talk with a doctor about physical activity.  Eat heart-healthy foods. Choose foods that are without trans fat and are low in saturated fat, cholesterol, and salt (sodium). This includes fresh or frozen fruits and vegetables, fish, lean meats, fat-free or low-fat dairy foods, whole grains, and high-fiber foods. Lentils and dried peas and beans (legumes) are also good choices.  Limit salt if told by your doctor.  Cook in a healthy way. Roast, grill, broil, bake, poach, steam, or stir-fry foods.  Limit fluids as told by your doctor.  Weigh yourself every morning. Do this after you pee (urinate) and before you eat breakfast. Write down your weight to give to your doctor.  Take your blood pressure and write it down if your doctor tells you to.  Ask your doctor how to check your pulse. Check your pulse as told.  Lose weight if told by your doctor.  Stop smoking or chewing tobacco. Do not use gum or patches that help you quit without your doctor's approval.  Schedule and go to doctor visits as told.  Nonpregnant women should have no more than 1 drink a day. Men should have no more than 2 drinks a day. Talk to your doctor about drinking alcohol.  Stop illegal drug use.  Stay current with shots (immunizations).  Manage your health conditions as told by your doctor.  Learn to manage your stress.  Rest when you are tired.  If it is really hot outside: ? Avoid intense activities. ? Use air conditioning or fans, or get in a cooler place. ? Avoid caffeine and alcohol. ? Wear loose-fitting, lightweight, and light-colored clothing.  If it is really cold outside: ? Avoid intense activities. ? Layer your  clothing. ? Wear mittens or gloves, a hat, and a scarf when going outside. ? Avoid alcohol.  Learn about heart failure and get support as needed.  Get help to maintain or improve your quality of life and your ability to care for yourself as needed. Contact a doctor if:  You gain weight quickly.  You are more short of breath than usual.  You cannot do your normal activities.  You tire easily.  You cough more than normal, especially with activity.  You have any or more puffiness (swelling) in areas such as your hands, feet, ankles, or belly (abdomen).  You cannot sleep because it is hard to breathe.  You feel like your heart is beating fast (palpitations).  You get  dizzy or light-headed when you stand up. Get help right away if:  You have trouble breathing.  There is a change in mental status, such as becoming less alert or not being able to focus.  You have chest pain or discomfort.  You faint. This information is not intended to replace advice given to you by your health care provider. Make sure you discuss any questions you have with your health care provider. Document Released: 07/14/2008 Document Revised: 03/12/2016 Document Reviewed: 11/21/2012 Elsevier Interactive Patient Education  2017 [employer].

## 2018-08-16 ENCOUNTER — Other Ambulatory Visit: Payer: Self-pay | Admitting: Adult Health

## 2018-08-16 DIAGNOSIS — G609 Hereditary and idiopathic neuropathy, unspecified: Secondary | ICD-10-CM | POA: Diagnosis not present

## 2018-08-16 DIAGNOSIS — R2681 Unsteadiness on feet: Secondary | ICD-10-CM | POA: Diagnosis not present

## 2018-08-16 DIAGNOSIS — G20 Parkinson's disease: Secondary | ICD-10-CM | POA: Diagnosis not present

## 2018-08-16 DIAGNOSIS — F329 Major depressive disorder, single episode, unspecified: Secondary | ICD-10-CM | POA: Diagnosis not present

## 2018-08-16 DIAGNOSIS — J449 Chronic obstructive pulmonary disease, unspecified: Secondary | ICD-10-CM | POA: Diagnosis not present

## 2018-08-16 DIAGNOSIS — M6281 Muscle weakness (generalized): Secondary | ICD-10-CM | POA: Diagnosis not present

## 2018-08-16 LAB — COMPLETE METABOLIC PANEL WITH GFR
AG RATIO: 1.5 (calc) (ref 1.0–2.5)
ALT: 21 U/L (ref 6–29)
AST: 14 U/L (ref 10–35)
Albumin: 3.7 g/dL (ref 3.6–5.1)
Alkaline phosphatase (APISO): 60 U/L (ref 33–130)
BILIRUBIN TOTAL: 0.5 mg/dL (ref 0.2–1.2)
BUN/Creatinine Ratio: 28 (calc) — ABNORMAL HIGH (ref 6–22)
BUN: 26 mg/dL — ABNORMAL HIGH (ref 7–25)
CALCIUM: 9.3 mg/dL (ref 8.6–10.4)
CO2: 32 mmol/L (ref 20–32)
Chloride: 102 mmol/L (ref 98–110)
Creat: 0.94 mg/dL — ABNORMAL HIGH (ref 0.60–0.88)
GFR, EST NON AFRICAN AMERICAN: 57 mL/min/{1.73_m2} — AB (ref >=60)
GFR, Est African American: 66 mL/min/{1.73_m2} (ref >=60)
Globulin: 2.4 g/dL (calc) (ref 1.9–3.7)
Glucose, Bld: 108 mg/dL — ABNORMAL HIGH (ref 65–99)
POTASSIUM: 4.7 mmol/L (ref 3.5–5.3)
SODIUM: 143 mmol/L (ref 135–146)
Total Protein: 6.1 g/dL (ref 6.1–8.1)

## 2018-08-16 LAB — CBC WITH DIFFERENTIAL/PLATELET
BASOS PCT: 0.4 %
Basophils Absolute: 42 cells/uL (ref 0–200)
EOS PCT: 0.2 %
Eosinophils Absolute: 21 cells/uL (ref 15–500)
HCT: 41.4 % (ref 35.0–45.0)
Hemoglobin: 13.9 g/dL (ref 11.7–15.5)
Lymphs Abs: 1706 cells/uL (ref 850–3900)
MCH: 29.1 pg (ref 27.0–33.0)
MCHC: 33.6 g/dL (ref 32.0–36.0)
MCV: 86.8 fL (ref 80.0–100.0)
MPV: 10.5 fL (ref 7.5–12.5)
Monocytes Relative: 6.8 %
NEUTROS PCT: 76.2 %
Neutro Abs: 7925 cells/uL — ABNORMAL HIGH (ref 1500–7800)
PLATELETS: 286 10*3/uL (ref 140–400)
RBC: 4.77 10*6/uL (ref 3.80–5.10)
RDW: 13.7 % (ref 11.0–15.0)
TOTAL LYMPHOCYTE: 16.4 %
WBC: 10.4 10*3/uL (ref 3.8–10.8)
WBCMIX: 707 {cells}/uL (ref 200–950)

## 2018-08-16 LAB — HEMOGLOBIN A1C
Hgb A1c MFr Bld: 6.4 % of total Hgb — ABNORMAL HIGH (ref <5.7)
MEAN PLASMA GLUCOSE: 137 (calc)
eAG (mmol/L): 7.6 (calc)

## 2018-08-16 LAB — VITAMIN D 25 HYDROXY (VIT D DEFICIENCY, FRACTURES): VIT D 25 HYDROXY: 31 ng/mL (ref 30–100)

## 2018-08-16 LAB — MAGNESIUM: Magnesium: 2.3 mg/dL (ref 1.5–2.5)

## 2018-08-16 LAB — TSH: TSH: 0.59 mIU/L (ref 0.40–4.50)

## 2018-08-16 MED ORDER — VITAMIN D 50 MCG (2000 UT) PO CAPS: 4000.0000 [IU] | ORAL_CAPSULE | Freq: Every day | ORAL | Status: AC

## 2018-08-18 DIAGNOSIS — G20 Parkinson's disease: Secondary | ICD-10-CM | POA: Diagnosis not present

## 2018-08-18 DIAGNOSIS — F329 Major depressive disorder, single episode, unspecified: Secondary | ICD-10-CM | POA: Diagnosis not present

## 2018-08-18 DIAGNOSIS — R2681 Unsteadiness on feet: Secondary | ICD-10-CM | POA: Diagnosis not present

## 2018-08-18 DIAGNOSIS — G609 Hereditary and idiopathic neuropathy, unspecified: Secondary | ICD-10-CM | POA: Diagnosis not present

## 2018-08-18 DIAGNOSIS — M6281 Muscle weakness (generalized): Secondary | ICD-10-CM | POA: Diagnosis not present

## 2018-08-18 DIAGNOSIS — J449 Chronic obstructive pulmonary disease, unspecified: Secondary | ICD-10-CM | POA: Diagnosis not present

## 2018-08-19 DIAGNOSIS — J449 Chronic obstructive pulmonary disease, unspecified: Secondary | ICD-10-CM | POA: Diagnosis not present

## 2018-08-19 DIAGNOSIS — M6281 Muscle weakness (generalized): Secondary | ICD-10-CM | POA: Diagnosis not present

## 2018-08-19 DIAGNOSIS — F329 Major depressive disorder, single episode, unspecified: Secondary | ICD-10-CM | POA: Diagnosis not present

## 2018-08-19 DIAGNOSIS — G20 Parkinson's disease: Secondary | ICD-10-CM | POA: Diagnosis not present

## 2018-08-19 DIAGNOSIS — R2681 Unsteadiness on feet: Secondary | ICD-10-CM | POA: Diagnosis not present

## 2018-08-19 DIAGNOSIS — G609 Hereditary and idiopathic neuropathy, unspecified: Secondary | ICD-10-CM | POA: Diagnosis not present

## 2018-08-19 NOTE — Progress Notes (Signed)
Assessment and Plan:  Marie [name] was seen today for follow-up.  Diagnoses and all orders for this visit:  Chronic diastolic CHF (congestive heart failure) (HCC)/ pedal edema Resume 20 mg daily lasix, no signs other than edema, discussed edema management at length and things to do daily for CHF. She is not currently weighing daily, advised to start, discussed sodium/fluid intake, LE elevation, recommended she start wearing compression hose daily (apply first thing in AM after shower, remove in the evening) - further information provided on AVS, answered all questions, and encouraged to call with any further concerns.  Go to the ER if any chest pain, shortness of breath, nausea, dizziness -     COMPLETE METABOLIC PANEL WITH GFR  Essential hypertension Continue medications Monitor blood pressure at home; call if consistently over 130/80 Continue DASH diet.   Reminder to go to the ER if any CP, SOB, nausea, dizziness, severe HA, changes vision/speech, left arm numbness and tingling and jaw pain.  Further disposition pending results of labs. Discussed med's effects and SE's.   Over 30 minutes of exam, counseling, chart review, and critical decision making was performed.   Future Appointments  Date Time Provider [place]  09/23/2018  9:00 AM Leandrew Koyanagi, MD [facility] None  11/16/2018 10:30 AM [name], MD [facility] None  12/14/2018  8:50 AM Narda Amber K, DO [facility] None  06/06/2019  2:00 PM [name], MD [facility] None    ------------------------------------------------------------------------------------------------------------------   HPI BP 118/76   Pulse 72   Temp 97.7 F (36.5 C)   Ht 5\' 4"  (1.626 m)   Wt 154 lb (69.9 kg)   SpO2 97%   BMI 26.43 kg/m   82 y.o.female with parkinsons +, diastolic CHF, LVH and subvalvular aortic stenosis by imaging, and limited health literacy presents accompanied by her husband for close follow up after recent medication  adjustment for reportedly worsening pedal edema. She was previously on 20 mg lasix daily, was increased to 40 mg x 5 days and presents today for follow up. Weights are stable and pedal/LE edema appears unchanged. She is not currently weighing at home. She does elevate her feet when sitting but not above heart level, and doesn't wear compression hose. She does watch her sodium intake.   She has a history of Diastolic CHF, last ECHO 01/980 LVH but with EF of 65-70% at last echo in 2018, denies dyspnea on exertion, orthopnea and paroxysmal nocturnal dyspnea. She continues to have pitting edema of bilateral extremities, today reports this is consistent with her baseline. She denies chest pain, fatigue, cough.  Wt Readings from Last 3 Encounters:  08/22/18 154 lb (69.9 kg)  08/15/18 154 lb (69.9 kg)  08/12/18 162 lb 8 oz (73.7 kg)   Her blood pressure has been controlled at home, today their BP is BP: 118/76  She does not workout.   Lab Results  Component Value Date   GFRNONAA 57 (L) 08/15/2018      Past Medical History:  Diagnosis Date  . Balance disorder 2008.     Falls a lot.  She can be standing and then leans too far over to one side   . Bulging disc   . Chronic bronchitis ([place])    due to congestion at times, on prednisone and advair  . COPD (chronic obstructive pulmonary disease) ([place])   . COPD with acute exacerbation ([place]) 12/15/2017  . Depression    many years ago  . GERD (gastroesophageal reflux disease)   .  Hearing loss    mild  . Hyperlipidemia   . Hypertension   . Iatrogenic adrenal insufficiency ([place])   . Incontinence    not indicated at this visit.  Marland Kitchen LVH (left ventricular hypertrophy) due to hypertensive disease    a. 02/2017: echo showing an EF of 65-70% with moderate LVH, hyperdynamic LV with subvalvular gradient of 70 mmHg, SAM, and mild MR  . Neuropathy    legs stay numb   . Osteoarthritis    hands,   . Osteoporosis   . Shortness of breath dyspnea   .  Tubulovillous adenoma of rectum   . Wears dentures   . Wears glasses      Allergies  Allergen Reactions  . Demerol  [Meperidine Hcl] Other (See Comments)    Pt. Feels like she is suffocating  . Ertapenem Hives    Caused whole body to turn red Other reaction(s): Redness Caused whole body to turn red  . Codeine Other (See Comments)    hallucinations  . Demerol Other (See Comments)    hallucinations  . Morphine And Related Other (See Comments)    hallucinations  . Other Other (See Comments)    Invan 7- pt became red all over  . Sulfate Other (See Comments)    unknown    Current Outpatient Medications on File Prior to Visit  Medication Sig  . albuterol (PROVENTIL) (2.5 MG/3ML) 0.083% nebulizer solution Use 1 ampule in nebulizer 4 times a day or every 4 hours as needed to rescue asthma. (Patient taking differently: Take 2.5 mg by nebulization See admin instructions. Use 1 ampule in nebulizer 4 times a day or every 4 hours as needed to rescue asthma.)  . aspirin 81 MG tablet Take 81 mg by mouth daily. Buys OTC  . carbidopa-levodopa (SINEMET IR) 25-100 MG tablet Take 2 tablet at 9am and 2 tablets at 3pm. (Patient taking differently: Take 2 tablets by mouth 2 (two) times daily. Take 2 tablet at 9am and 2 tablets at 3pm.)  . Cholecalciferol (VITAMIN D) 2000 units CAPS Take 2 capsules (4,000 Units total) by mouth daily.  Marland Kitchen ENSURE (ENSURE) Take 237 mLs by mouth daily as needed.   . furosemide (LASIX) 20 MG tablet Increase lasix for 40 mg (2 tabs) daily for 3-5 days until swelling resolves, then resume 20 mg (1 tab daily).  Marland Kitchen omeprazole (PRILOSEC) 40 MG capsule Take 40 mg by mouth daily.   . OXYGEN Inhale 2 L into the lungs daily as needed (for shortness of breath). Setting is on 2 when needed   . potassium chloride SA (K-DUR,KLOR-CON) 20 MEQ tablet Take 1 tablet by mouth daily.  . sertraline (ZOLOFT) 25 MG tablet Take 1 tablet (25 mg total) by mouth daily.  . TRELEGY ELLIPTA 100-62.5-25  MCG/INH AEPB INHALE 1 PUFF ONCE DAILY   No current facility-administered medications on file prior to visit.     ROS: all negative except above.   Physical Exam:  BP 118/76   Pulse 72   Temp 97.7 F (36.5 C)   Ht 5\' 4"  (1.626 m)   Wt 154 lb (69.9 kg)   SpO2 97%   BMI 26.43 kg/m   General Appearance: Well nourished, appears fatigued/not at known baseline, in no acute distress. Eyes: PERRLA, EOMs, conjunctiva no swelling or erythema Sinuses: No Frontal/maxillary tenderness ENT/Mouth: Ext aud canals clear, TMs without erythema, bulging. No erythema, swelling, or exudate on post pharynx.  Tonsils not swollen or erythematous. Hearing normal.  Neck: Supple,  thyroid normal.   Respiratory: Respiratory effort normal, expansion limited somewhat, BS equal bilaterally without rales, rhonchi, wheezing or stridor.  Cardio: RRR with 4/6 blowing systolic murmur. Bilateral extremity pulses 1+, with bilateral pitting edema 2+ to bilateral lower legs/ankles.  Abdomen: Soft, + BS.  Non tender, no guarding, rebound, hernias, masses. Lymphatics: Non tender without lymphadenopathy.  Musculoskeletal: Symmetrical strength, weak, walks slowly with walker Skin: Visible skin Warm, dry, very thin/fragile, no distinct lesions, ecchymosis or rash Psych: Awake and oriented X 3, normal affect, Insight and Judgment fair.     Izora Ribas, NP 12:11 PM [facility]

## 2018-08-22 ENCOUNTER — Encounter: Payer: Self-pay | Admitting: Adult Health

## 2018-08-22 ENCOUNTER — Ambulatory Visit (INDEPENDENT_AMBULATORY_CARE_PROVIDER_SITE_OTHER): Payer: Medicare Other | Admitting: Adult Health

## 2018-08-22 VITALS — BP 118/76 | HR 72 | Temp 97.7°F | Ht 64.0 in | Wt 154.0 lb

## 2018-08-22 DIAGNOSIS — R2681 Unsteadiness on feet: Secondary | ICD-10-CM | POA: Diagnosis not present

## 2018-08-22 DIAGNOSIS — I5032 Chronic diastolic (congestive) heart failure: Secondary | ICD-10-CM | POA: Diagnosis not present

## 2018-08-22 DIAGNOSIS — R600 Localized edema: Secondary | ICD-10-CM

## 2018-08-22 DIAGNOSIS — F329 Major depressive disorder, single episode, unspecified: Secondary | ICD-10-CM | POA: Diagnosis not present

## 2018-08-22 DIAGNOSIS — G20 Parkinson's disease: Secondary | ICD-10-CM | POA: Diagnosis not present

## 2018-08-22 DIAGNOSIS — G609 Hereditary and idiopathic neuropathy, unspecified: Secondary | ICD-10-CM | POA: Diagnosis not present

## 2018-08-22 DIAGNOSIS — I10 Essential (primary) hypertension: Secondary | ICD-10-CM

## 2018-08-22 DIAGNOSIS — F419 Anxiety disorder, unspecified: Secondary | ICD-10-CM | POA: Diagnosis not present

## 2018-08-22 DIAGNOSIS — J449 Chronic obstructive pulmonary disease, unspecified: Secondary | ICD-10-CM | POA: Diagnosis not present

## 2018-08-22 DIAGNOSIS — M6281 Muscle weakness (generalized): Secondary | ICD-10-CM | POA: Diagnosis not present

## 2018-08-22 LAB — COMPLETE METABOLIC PANEL WITH GFR
AG Ratio: 1.4 (calc) (ref 1.0–2.5)
ALBUMIN MSPROF: 3.7 g/dL (ref 3.6–5.1)
ALKALINE PHOSPHATASE (APISO): 56 U/L (ref 33–130)
ALT: 8 U/L (ref 6–29)
AST: 18 U/L (ref 10–35)
BILIRUBIN TOTAL: 0.6 mg/dL (ref 0.2–1.2)
BUN / CREAT RATIO: 17 (calc) (ref 6–22)
BUN: 18 mg/dL (ref 7–25)
CO2: 30 mmol/L (ref 20–32)
CREATININE: 1.03 mg/dL — AB (ref 0.60–0.88)
Calcium: 9.2 mg/dL (ref 8.6–10.4)
Chloride: 104 mmol/L (ref 98–110)
GFR, EST AFRICAN AMERICAN: 59 mL/min/{1.73_m2} — AB (ref >=60)
GFR, Est Non African American: 51 mL/min/{1.73_m2} — ABNORMAL LOW (ref >=60)
GLOBULIN: 2.6 g/dL (ref 1.9–3.7)
Glucose, Bld: 104 mg/dL — ABNORMAL HIGH (ref 65–99)
Potassium: 3.7 mmol/L (ref 3.5–5.3)
SODIUM: 144 mmol/L (ref 135–146)
TOTAL PROTEIN: 6.3 g/dL (ref 6.1–8.1)

## 2018-08-22 MED ORDER — PREDNISONE 10 MG PO TABS: 10.0000 mg | ORAL_TABLET | Freq: Every day | ORAL | 1 refills | Status: DC

## 2018-08-22 MED ORDER — ALPRAZOLAM 0.5 MG PO TABS: ORAL_TABLET | ORAL | 0 refills | Status: DC

## 2018-08-22 NOTE — Patient Instructions (Signed)
COMPRESSION STOCKINGS  HOME CARE INSTRUCTIONS   Do not stand or sit in one position for long periods of time. Do not sit with your legs crossed. Rest with your legs raised during the day.  Your legs have to be higher than your heart so that gravity will force the valves to open, so please really elevate your legs.   Wear elastic stockings or support hose. Do not wear other tight, encircling garments around the legs, pelvis, or waist.  [employer]  has a wide variety of well priced compression stockings. [place], [place] [id number] [id number] has a good cheap selection, I like the socks, they are not as hard to get on  Walk as much as possible to increase blood flow.  Raise the foot of your bed at night with 2-inch blocks. SEEK MEDICAL CARE IF:   The skin around your ankle starts to break down.  You have pain, redness, tenderness, or hard swelling developing in your leg over a vein.  You are uncomfortable due to leg pain. Document Released: 07/15/2005 Document Revised: 12/28/2011 Document Reviewed: 12/01/2010 [facility] Patient Information 2014 [place].     Do the following things EVERYDAY: 1) Weigh yourself in the morning before breakfast or at the same time every day. Write it down and keep it in a log. 2) Take your medicines as prescribed 3) Eat low salt foods-Limit salt (sodium) to 2000 mg per day. Best thing to do is avoid processed foods.   4) Stay as active as you can everyday 5) Limit all fluids for the day to less than 1.5 liters  Call your doctor if:  Anytime you have any of the following symptoms:  1) 2 pound weight gain in 24 hours or 5 pounds in 1 week  2) shortness of breath, with or without a dry hacking cough  3) swelling in the hands, LEGs, feet or stomach  4) if you have to sleep on extra pillows at night in order to breathe. 5) after laying down at night for 20-30 mins, you wake up short of breath.   These can all  be signs of fluid overload.    Heart Failure Heart failure means your heart has trouble pumping blood. This makes it hard for your body to work well. Heart failure is usually a long-term (chronic) condition. You must take good care of yourself and follow your doctor's treatment plan. Follow these instructions at home:  Take your heart medicine as told by your doctor. ? Do not stop taking medicine unless your doctor tells you to. ? Do not skip any dose of medicine. ? Refill your medicines before they run out. ? Take other medicines only as told by your doctor or pharmacist.  Stay active if told by your doctor. The elderly and people with severe heart failure should talk with a doctor about physical activity.  Eat heart-healthy foods. Choose foods that are without trans fat and are low in saturated fat, cholesterol, and salt (sodium). This includes fresh or frozen fruits and vegetables, fish, lean meats, fat-free or low-fat dairy foods, whole grains, and high-fiber foods. Lentils and dried peas and beans (legumes) are also good choices.  Limit salt if told by your doctor.  Cook in a healthy way. Roast, grill, broil, bake, poach, steam, or stir-fry foods.  Limit fluids as told by your doctor.  Weigh yourself every morning. Do this after you pee (urinate) and before you eat breakfast. Write  down your weight to give to your doctor.  Take your blood pressure and write it down if your doctor tells you to.  Ask your doctor how to check your pulse. Check your pulse as told.  Lose weight if told by your doctor.  Stop smoking or chewing tobacco. Do not use gum or patches that help you quit without your doctor's approval.  Schedule and go to doctor visits as told.  Nonpregnant women should have no more than 1 drink a day. Men should have no more than 2 drinks a day. Talk to your doctor about drinking alcohol.  Stop illegal drug use.  Stay current with shots (immunizations).  Manage your  health conditions as told by your doctor.  Learn to manage your stress.  Rest when you are tired.  If it is really hot outside: ? Avoid intense activities. ? Use air conditioning or fans, or get in a cooler place. ? Avoid caffeine and alcohol. ? Wear loose-fitting, lightweight, and light-colored clothing.  If it is really cold outside: ? Avoid intense activities. ? Layer your clothing. ? Wear mittens or gloves, a hat, and a scarf when going outside. ? Avoid alcohol.  Learn about heart failure and get support as needed.  Get help to maintain or improve your quality of life and your ability to care for yourself as needed. Contact a doctor if:  You gain weight quickly.  You are more short of breath than usual.  You cannot do your normal activities.  You tire easily.  You cough more than normal, especially with activity.  You have any or more puffiness (swelling) in areas such as your hands, feet, ankles, or belly (abdomen).  You cannot sleep because it is hard to breathe.  You feel like your heart is beating fast (palpitations).  You get dizzy or light-headed when you stand up. Get help right away if:  You have trouble breathing.  There is a change in mental status, such as becoming less alert or not being able to focus.  You have chest pain or discomfort.  You faint. This information is not intended to replace advice given to you by your health care provider. Make sure you discuss any questions you have with your health care provider. Document Released: 07/14/2008 Document Revised: 03/12/2016 Document Reviewed: 11/21/2012 Elsevier Interactive Patient Education  2017 [employer].

## 2018-08-23 DIAGNOSIS — R2681 Unsteadiness on feet: Secondary | ICD-10-CM | POA: Diagnosis not present

## 2018-08-23 DIAGNOSIS — M6281 Muscle weakness (generalized): Secondary | ICD-10-CM | POA: Diagnosis not present

## 2018-08-23 DIAGNOSIS — F329 Major depressive disorder, single episode, unspecified: Secondary | ICD-10-CM | POA: Diagnosis not present

## 2018-08-23 DIAGNOSIS — G609 Hereditary and idiopathic neuropathy, unspecified: Secondary | ICD-10-CM | POA: Diagnosis not present

## 2018-08-23 DIAGNOSIS — J449 Chronic obstructive pulmonary disease, unspecified: Secondary | ICD-10-CM | POA: Diagnosis not present

## 2018-08-23 DIAGNOSIS — G20 Parkinson's disease: Secondary | ICD-10-CM | POA: Diagnosis not present

## 2018-08-24 DIAGNOSIS — F329 Major depressive disorder, single episode, unspecified: Secondary | ICD-10-CM | POA: Diagnosis not present

## 2018-08-24 DIAGNOSIS — M6281 Muscle weakness (generalized): Secondary | ICD-10-CM | POA: Diagnosis not present

## 2018-08-24 DIAGNOSIS — G20 Parkinson's disease: Secondary | ICD-10-CM | POA: Diagnosis not present

## 2018-08-24 DIAGNOSIS — J449 Chronic obstructive pulmonary disease, unspecified: Secondary | ICD-10-CM | POA: Diagnosis not present

## 2018-08-24 DIAGNOSIS — G609 Hereditary and idiopathic neuropathy, unspecified: Secondary | ICD-10-CM | POA: Diagnosis not present

## 2018-08-24 DIAGNOSIS — R2681 Unsteadiness on feet: Secondary | ICD-10-CM | POA: Diagnosis not present

## 2018-08-25 DIAGNOSIS — M6281 Muscle weakness (generalized): Secondary | ICD-10-CM | POA: Diagnosis not present

## 2018-08-25 DIAGNOSIS — R2681 Unsteadiness on feet: Secondary | ICD-10-CM | POA: Diagnosis not present

## 2018-08-25 DIAGNOSIS — G20 Parkinson's disease: Secondary | ICD-10-CM | POA: Diagnosis not present

## 2018-08-25 DIAGNOSIS — F329 Major depressive disorder, single episode, unspecified: Secondary | ICD-10-CM | POA: Diagnosis not present

## 2018-08-25 DIAGNOSIS — G609 Hereditary and idiopathic neuropathy, unspecified: Secondary | ICD-10-CM | POA: Diagnosis not present

## 2018-08-25 DIAGNOSIS — J449 Chronic obstructive pulmonary disease, unspecified: Secondary | ICD-10-CM | POA: Diagnosis not present

## 2018-08-29 DIAGNOSIS — J449 Chronic obstructive pulmonary disease, unspecified: Secondary | ICD-10-CM | POA: Diagnosis not present

## 2018-08-29 DIAGNOSIS — M6281 Muscle weakness (generalized): Secondary | ICD-10-CM | POA: Diagnosis not present

## 2018-08-29 DIAGNOSIS — G20 Parkinson's disease: Secondary | ICD-10-CM | POA: Diagnosis not present

## 2018-08-29 DIAGNOSIS — G609 Hereditary and idiopathic neuropathy, unspecified: Secondary | ICD-10-CM | POA: Diagnosis not present

## 2018-08-29 DIAGNOSIS — R2681 Unsteadiness on feet: Secondary | ICD-10-CM | POA: Diagnosis not present

## 2018-08-29 DIAGNOSIS — F329 Major depressive disorder, single episode, unspecified: Secondary | ICD-10-CM | POA: Diagnosis not present

## 2018-08-30 DIAGNOSIS — R2681 Unsteadiness on feet: Secondary | ICD-10-CM | POA: Diagnosis not present

## 2018-08-30 DIAGNOSIS — J449 Chronic obstructive pulmonary disease, unspecified: Secondary | ICD-10-CM | POA: Diagnosis not present

## 2018-08-30 DIAGNOSIS — M6281 Muscle weakness (generalized): Secondary | ICD-10-CM | POA: Diagnosis not present

## 2018-08-30 DIAGNOSIS — G609 Hereditary and idiopathic neuropathy, unspecified: Secondary | ICD-10-CM | POA: Diagnosis not present

## 2018-08-30 DIAGNOSIS — G20 Parkinson's disease: Secondary | ICD-10-CM | POA: Diagnosis not present

## 2018-08-30 DIAGNOSIS — F329 Major depressive disorder, single episode, unspecified: Secondary | ICD-10-CM | POA: Diagnosis not present

## 2018-09-01 DIAGNOSIS — M6281 Muscle weakness (generalized): Secondary | ICD-10-CM | POA: Diagnosis not present

## 2018-09-01 DIAGNOSIS — J449 Chronic obstructive pulmonary disease, unspecified: Secondary | ICD-10-CM | POA: Diagnosis not present

## 2018-09-01 DIAGNOSIS — G609 Hereditary and idiopathic neuropathy, unspecified: Secondary | ICD-10-CM | POA: Diagnosis not present

## 2018-09-01 DIAGNOSIS — G20 Parkinson's disease: Secondary | ICD-10-CM | POA: Diagnosis not present

## 2018-09-01 DIAGNOSIS — F329 Major depressive disorder, single episode, unspecified: Secondary | ICD-10-CM | POA: Diagnosis not present

## 2018-09-01 DIAGNOSIS — R2681 Unsteadiness on feet: Secondary | ICD-10-CM | POA: Diagnosis not present

## 2018-09-05 DIAGNOSIS — M6281 Muscle weakness (generalized): Secondary | ICD-10-CM | POA: Diagnosis not present

## 2018-09-05 DIAGNOSIS — J449 Chronic obstructive pulmonary disease, unspecified: Secondary | ICD-10-CM | POA: Diagnosis not present

## 2018-09-05 DIAGNOSIS — F329 Major depressive disorder, single episode, unspecified: Secondary | ICD-10-CM | POA: Diagnosis not present

## 2018-09-05 DIAGNOSIS — R2681 Unsteadiness on feet: Secondary | ICD-10-CM | POA: Diagnosis not present

## 2018-09-05 DIAGNOSIS — G609 Hereditary and idiopathic neuropathy, unspecified: Secondary | ICD-10-CM | POA: Diagnosis not present

## 2018-09-05 DIAGNOSIS — G20 Parkinson's disease: Secondary | ICD-10-CM | POA: Diagnosis not present

## 2018-09-06 DIAGNOSIS — I5032 Chronic diastolic (congestive) heart failure: Secondary | ICD-10-CM | POA: Diagnosis not present

## 2018-09-06 DIAGNOSIS — J9611 Chronic respiratory failure with hypoxia: Secondary | ICD-10-CM | POA: Diagnosis not present

## 2018-09-06 DIAGNOSIS — I10 Essential (primary) hypertension: Secondary | ICD-10-CM | POA: Diagnosis not present

## 2018-09-06 DIAGNOSIS — J449 Chronic obstructive pulmonary disease, unspecified: Secondary | ICD-10-CM | POA: Diagnosis not present

## 2018-09-07 DIAGNOSIS — F329 Major depressive disorder, single episode, unspecified: Secondary | ICD-10-CM | POA: Diagnosis not present

## 2018-09-07 DIAGNOSIS — R2681 Unsteadiness on feet: Secondary | ICD-10-CM | POA: Diagnosis not present

## 2018-09-07 DIAGNOSIS — M6281 Muscle weakness (generalized): Secondary | ICD-10-CM | POA: Diagnosis not present

## 2018-09-07 DIAGNOSIS — G20 Parkinson's disease: Secondary | ICD-10-CM | POA: Diagnosis not present

## 2018-09-07 DIAGNOSIS — J449 Chronic obstructive pulmonary disease, unspecified: Secondary | ICD-10-CM | POA: Diagnosis not present

## 2018-09-07 DIAGNOSIS — G609 Hereditary and idiopathic neuropathy, unspecified: Secondary | ICD-10-CM | POA: Diagnosis not present

## 2018-09-09 DIAGNOSIS — R2681 Unsteadiness on feet: Secondary | ICD-10-CM | POA: Diagnosis not present

## 2018-09-09 DIAGNOSIS — G609 Hereditary and idiopathic neuropathy, unspecified: Secondary | ICD-10-CM | POA: Diagnosis not present

## 2018-09-09 DIAGNOSIS — F329 Major depressive disorder, single episode, unspecified: Secondary | ICD-10-CM | POA: Diagnosis not present

## 2018-09-09 DIAGNOSIS — M6281 Muscle weakness (generalized): Secondary | ICD-10-CM | POA: Diagnosis not present

## 2018-09-09 DIAGNOSIS — G20 Parkinson's disease: Secondary | ICD-10-CM | POA: Diagnosis not present

## 2018-09-09 DIAGNOSIS — J449 Chronic obstructive pulmonary disease, unspecified: Secondary | ICD-10-CM | POA: Diagnosis not present

## 2018-09-12 DIAGNOSIS — F329 Major depressive disorder, single episode, unspecified: Secondary | ICD-10-CM | POA: Diagnosis not present

## 2018-09-12 DIAGNOSIS — J449 Chronic obstructive pulmonary disease, unspecified: Secondary | ICD-10-CM | POA: Diagnosis not present

## 2018-09-12 DIAGNOSIS — M6281 Muscle weakness (generalized): Secondary | ICD-10-CM | POA: Diagnosis not present

## 2018-09-12 DIAGNOSIS — R2681 Unsteadiness on feet: Secondary | ICD-10-CM | POA: Diagnosis not present

## 2018-09-12 DIAGNOSIS — G20 Parkinson's disease: Secondary | ICD-10-CM | POA: Diagnosis not present

## 2018-09-12 DIAGNOSIS — G609 Hereditary and idiopathic neuropathy, unspecified: Secondary | ICD-10-CM | POA: Diagnosis not present

## 2018-09-13 DIAGNOSIS — J449 Chronic obstructive pulmonary disease, unspecified: Secondary | ICD-10-CM | POA: Diagnosis not present

## 2018-09-13 DIAGNOSIS — F329 Major depressive disorder, single episode, unspecified: Secondary | ICD-10-CM | POA: Diagnosis not present

## 2018-09-13 DIAGNOSIS — G20 Parkinson's disease: Secondary | ICD-10-CM | POA: Diagnosis not present

## 2018-09-13 DIAGNOSIS — R2681 Unsteadiness on feet: Secondary | ICD-10-CM | POA: Diagnosis not present

## 2018-09-13 DIAGNOSIS — M6281 Muscle weakness (generalized): Secondary | ICD-10-CM | POA: Diagnosis not present

## 2018-09-13 DIAGNOSIS — G609 Hereditary and idiopathic neuropathy, unspecified: Secondary | ICD-10-CM | POA: Diagnosis not present

## 2018-09-14 DIAGNOSIS — M6281 Muscle weakness (generalized): Secondary | ICD-10-CM | POA: Diagnosis not present

## 2018-09-14 DIAGNOSIS — F329 Major depressive disorder, single episode, unspecified: Secondary | ICD-10-CM | POA: Diagnosis not present

## 2018-09-14 DIAGNOSIS — R2681 Unsteadiness on feet: Secondary | ICD-10-CM | POA: Diagnosis not present

## 2018-09-14 DIAGNOSIS — J449 Chronic obstructive pulmonary disease, unspecified: Secondary | ICD-10-CM | POA: Diagnosis not present

## 2018-09-14 DIAGNOSIS — G609 Hereditary and idiopathic neuropathy, unspecified: Secondary | ICD-10-CM | POA: Diagnosis not present

## 2018-09-14 DIAGNOSIS — G20 Parkinson's disease: Secondary | ICD-10-CM | POA: Diagnosis not present

## 2018-09-19 ENCOUNTER — Other Ambulatory Visit: Payer: Self-pay | Admitting: Physician Assistant

## 2018-09-19 DIAGNOSIS — F419 Anxiety disorder, unspecified: Secondary | ICD-10-CM

## 2018-09-19 MED ORDER — SERTRALINE HCL 25 MG PO TABS: 25.0000 mg | ORAL_TABLET | Freq: Every day | ORAL | 1 refills | Status: DC

## 2018-09-19 MED ORDER — OMEPRAZOLE 40 MG PO CPDR: 40.0000 mg | DELAYED_RELEASE_CAPSULE | Freq: Every day | ORAL | 1 refills | Status: DC

## 2018-09-19 MED ORDER — ALPRAZOLAM 0.5 MG PO TABS: ORAL_TABLET | ORAL | 0 refills | Status: DC

## 2018-09-19 MED ORDER — POTASSIUM CHLORIDE CRYS ER 20 MEQ PO TBCR: EXTENDED_RELEASE_TABLET | ORAL | 1 refills | Status: DC

## 2018-09-20 DIAGNOSIS — J449 Chronic obstructive pulmonary disease, unspecified: Secondary | ICD-10-CM | POA: Diagnosis not present

## 2018-09-20 DIAGNOSIS — G20 Parkinson's disease: Secondary | ICD-10-CM | POA: Diagnosis not present

## 2018-09-20 DIAGNOSIS — R2681 Unsteadiness on feet: Secondary | ICD-10-CM | POA: Diagnosis not present

## 2018-09-20 DIAGNOSIS — F329 Major depressive disorder, single episode, unspecified: Secondary | ICD-10-CM | POA: Diagnosis not present

## 2018-09-20 DIAGNOSIS — M6281 Muscle weakness (generalized): Secondary | ICD-10-CM | POA: Diagnosis not present

## 2018-09-20 DIAGNOSIS — G609 Hereditary and idiopathic neuropathy, unspecified: Secondary | ICD-10-CM | POA: Diagnosis not present

## 2018-09-22 DIAGNOSIS — F329 Major depressive disorder, single episode, unspecified: Secondary | ICD-10-CM | POA: Diagnosis not present

## 2018-09-22 DIAGNOSIS — J449 Chronic obstructive pulmonary disease, unspecified: Secondary | ICD-10-CM | POA: Diagnosis not present

## 2018-09-22 DIAGNOSIS — M6281 Muscle weakness (generalized): Secondary | ICD-10-CM | POA: Diagnosis not present

## 2018-09-22 DIAGNOSIS — G20 Parkinson's disease: Secondary | ICD-10-CM | POA: Diagnosis not present

## 2018-09-22 DIAGNOSIS — R2681 Unsteadiness on feet: Secondary | ICD-10-CM | POA: Diagnosis not present

## 2018-09-22 DIAGNOSIS — G609 Hereditary and idiopathic neuropathy, unspecified: Secondary | ICD-10-CM | POA: Diagnosis not present

## 2018-09-23 ENCOUNTER — Ambulatory Visit (INDEPENDENT_AMBULATORY_CARE_PROVIDER_SITE_OTHER): Payer: Medicare Other

## 2018-09-23 ENCOUNTER — Encounter (INDEPENDENT_AMBULATORY_CARE_PROVIDER_SITE_OTHER): Payer: Self-pay | Admitting: Orthopaedic Surgery

## 2018-09-23 ENCOUNTER — Ambulatory Visit (INDEPENDENT_AMBULATORY_CARE_PROVIDER_SITE_OTHER): Payer: Medicare Other | Admitting: Orthopaedic Surgery

## 2018-09-23 DIAGNOSIS — M79672 Pain in left foot: Secondary | ICD-10-CM

## 2018-09-23 NOTE — Progress Notes (Signed)
Patient: Marie [name]           Date of Birth: 1936/04/08           MRN: [id number] Visit Date: 09/23/2018 PCP: [name], MD   Assessment & Plan:  Chief Complaint:  Chief Complaint  Patient presents with  . Left Ankle - Follow-up   Visit Diagnoses:  1. Left foot pain     Plan: Skyra is 28-month status post nondisplaced oblique fibula fracture.  She is doing well.  She is receiving home physical therapy for gait training and balance.  She reports no pain.  On my exam she has no tenderness to palpation.  She has some chronic pitting edema.  At this point she has demonstrated healing of the fracture.  We can see her back as needed.  Questions encouraged and answered.  Follow-Up Instructions: Return if symptoms worsen or fail to improve.   Orders:  Orders Placed This Encounter  Procedures  . XR Ankle Complete Left   No orders of the defined types were placed in this encounter.   Imaging: Xr Ankle Complete Left  Result Date: 09/23/2018 Healed fibula fracture.  Generalized osteopenia.   PMFS History: Patient Active Problem List   Diagnosis Date Noted  . Asthmatic bronchitis 01/17/2018  . Chronic diastolic CHF (congestive heart failure) ([place]) 12/15/2017  . Anxiety 12/15/2017  . Subvalvular aortic stenosis determined by imaging 11/15/2017  . Pedal edema 11/15/2017  . Problem with medical care compliance 11/11/2017  . Aortic atherosclerosis ([place]) 10/19/2017  . Dysphagia 09/28/2017  . Chronic respiratory failure with hypoxia, on home O2 therapy ([place]) 09/20/2017  . Hypertensive heart disease 03/29/2017  . Protein-calorie malnutrition, severe 03/17/2017  . Parkinson's plus syndrome ([place]) 03/04/2017  . Restrictive lung disease 02/24/2017  . LVH (left ventricular hypertrophy) 01/07/2017  . Depression, major, in remission ([place]) 09/22/2016  . At high risk for falls 10/16/2015  . Prediabetes 08/23/2014  . Vitamin D deficiency 08/23/2014  . Medication management  08/23/2014  . Unstable Gait 01/30/2014  . Incontinence   . Hyperlipidemia   . GERD (gastroesophageal reflux disease)   . Hereditary and idiopathic peripheral neuropathy   . Essential hypertension   . Tubulovillous adenoma of rectum 09/04/2011   Past Medical History:  Diagnosis Date  . Balance disorder 2008.     Falls a lot.  She can be standing and then leans too far over to one side   . Bulging disc   . Chronic bronchitis ([place])    due to congestion at times, on prednisone and advair  . COPD (chronic obstructive pulmonary disease) ([place])   . COPD with acute exacerbation ([place]) 12/15/2017  . Depression    many years ago  . GERD (gastroesophageal reflux disease)   . Hearing loss    mild  . Hyperlipidemia   . Hypertension   . Iatrogenic adrenal insufficiency ([place])   . Incontinence    not indicated at this visit.  Marland Kitchen LVH (left ventricular hypertrophy) due to hypertensive disease    a. 02/2017: echo showing an EF of 65-70% with moderate LVH, hyperdynamic LV with subvalvular gradient of 70 mmHg, SAM, and mild MR  . Neuropathy    legs stay numb   . Osteoarthritis    hands,   . Osteoporosis   . Shortness of breath dyspnea   . Tubulovillous adenoma of rectum   . Wears dentures   . Wears glasses     Family History  Problem Relation Age of  Onset  . Cancer Sister        pt unaware of what kind  . High blood pressure Mother   . COPD Mother   . Heart attack Father     Past Surgical History:  Procedure Laterality Date  . ABDOMINAL HYSTERECTOMY    . APPENDECTOMY    . CHONDROPLASTY  09/19/2014   Procedure: CHONDROPLASTY;  Surgeon: Alta Corning, MD;  Location: [facility];  Service: Orthopedics;;  . DILATION AND CURETTAGE OF UTERUS    . EXTERNAL FIXATION LEG  10/25/2012   Procedure: EXTERNAL FIXATION LEG;  Surgeon: Rozanna Box, MD;  Location: [facility];  Service: Orthopedics;  Laterality: Right;  . EYE SURGERY     bilateral cataract surgery and lens implant  . FINGER  SURGERY     fusions and debridements for OA  . INCONTINENCE SURGERY     multiple procedures, not cured  . KNEE ARTHROSCOPY WITH LATERAL MENISECTOMY Right 09/19/2014   Procedure: KNEE ARTHROSCOPY WITH LATERAL MENISECTOMY;  Surgeon: Alta Corning, MD;  Location: [facility];  Service: Orthopedics;  Laterality: Right;  . KNEE ARTHROSCOPY WITH MEDIAL MENISECTOMY Right 09/19/2014   Procedure: RIGHT KNEE ARTHROSCOPY WITH MEDIAL AND LATERAL MENISECTOMIES. CHONDROPLASTY OF PATELLA-FEMORAL JOINT;  Surgeon: Alta Corning, MD;  Location: [facility];  Service: Orthopedics;  Laterality: Right;  . RECTAL BIOPSY  09/21/2011   Procedure: BIOPSY RECTAL;  Surgeon: Merrie Roof, MD;  Location: [facility];  Service: General;  Laterality: N/A;  3-4 cm  . RECTAL SURGERY     by dr. Marlou Starks, removal of polyp  . TONSILLECTOMY     Social History   Occupational History  . Not on file  Tobacco Use  . Smoking status: Never Smoker  . Smokeless tobacco: Never Used  Substance and Sexual Activity  . Alcohol use: No  . Drug use: No  . Sexual activity: Not on file

## 2018-09-26 DIAGNOSIS — J449 Chronic obstructive pulmonary disease, unspecified: Secondary | ICD-10-CM | POA: Diagnosis not present

## 2018-09-26 DIAGNOSIS — F329 Major depressive disorder, single episode, unspecified: Secondary | ICD-10-CM | POA: Diagnosis not present

## 2018-09-26 DIAGNOSIS — R2681 Unsteadiness on feet: Secondary | ICD-10-CM | POA: Diagnosis not present

## 2018-09-26 DIAGNOSIS — G609 Hereditary and idiopathic neuropathy, unspecified: Secondary | ICD-10-CM | POA: Diagnosis not present

## 2018-09-26 DIAGNOSIS — M6281 Muscle weakness (generalized): Secondary | ICD-10-CM | POA: Diagnosis not present

## 2018-09-26 DIAGNOSIS — G20 Parkinson's disease: Secondary | ICD-10-CM | POA: Diagnosis not present

## 2018-09-28 DIAGNOSIS — R2681 Unsteadiness on feet: Secondary | ICD-10-CM | POA: Diagnosis not present

## 2018-09-28 DIAGNOSIS — M6281 Muscle weakness (generalized): Secondary | ICD-10-CM | POA: Diagnosis not present

## 2018-09-28 DIAGNOSIS — G20 Parkinson's disease: Secondary | ICD-10-CM | POA: Diagnosis not present

## 2018-09-28 DIAGNOSIS — J449 Chronic obstructive pulmonary disease, unspecified: Secondary | ICD-10-CM | POA: Diagnosis not present

## 2018-09-28 DIAGNOSIS — G609 Hereditary and idiopathic neuropathy, unspecified: Secondary | ICD-10-CM | POA: Diagnosis not present

## 2018-09-28 DIAGNOSIS — F329 Major depressive disorder, single episode, unspecified: Secondary | ICD-10-CM | POA: Diagnosis not present

## 2018-09-28 IMAGING — CR DG ANKLE COMPLETE 3+V*L*
3 series · 3 of 3 positions shown · non-contrast
Comparison: 12/28/2004

CLINICAL DATA: LEFT foot pain after falling 1 week ago, pain at
anterior and lateral ankle, with redness, swelling and warmth in
lower leg, can't bear weight

EXAM:
LEFT ANKLE COMPLETE - 3+ VIEW

[ankle ap]
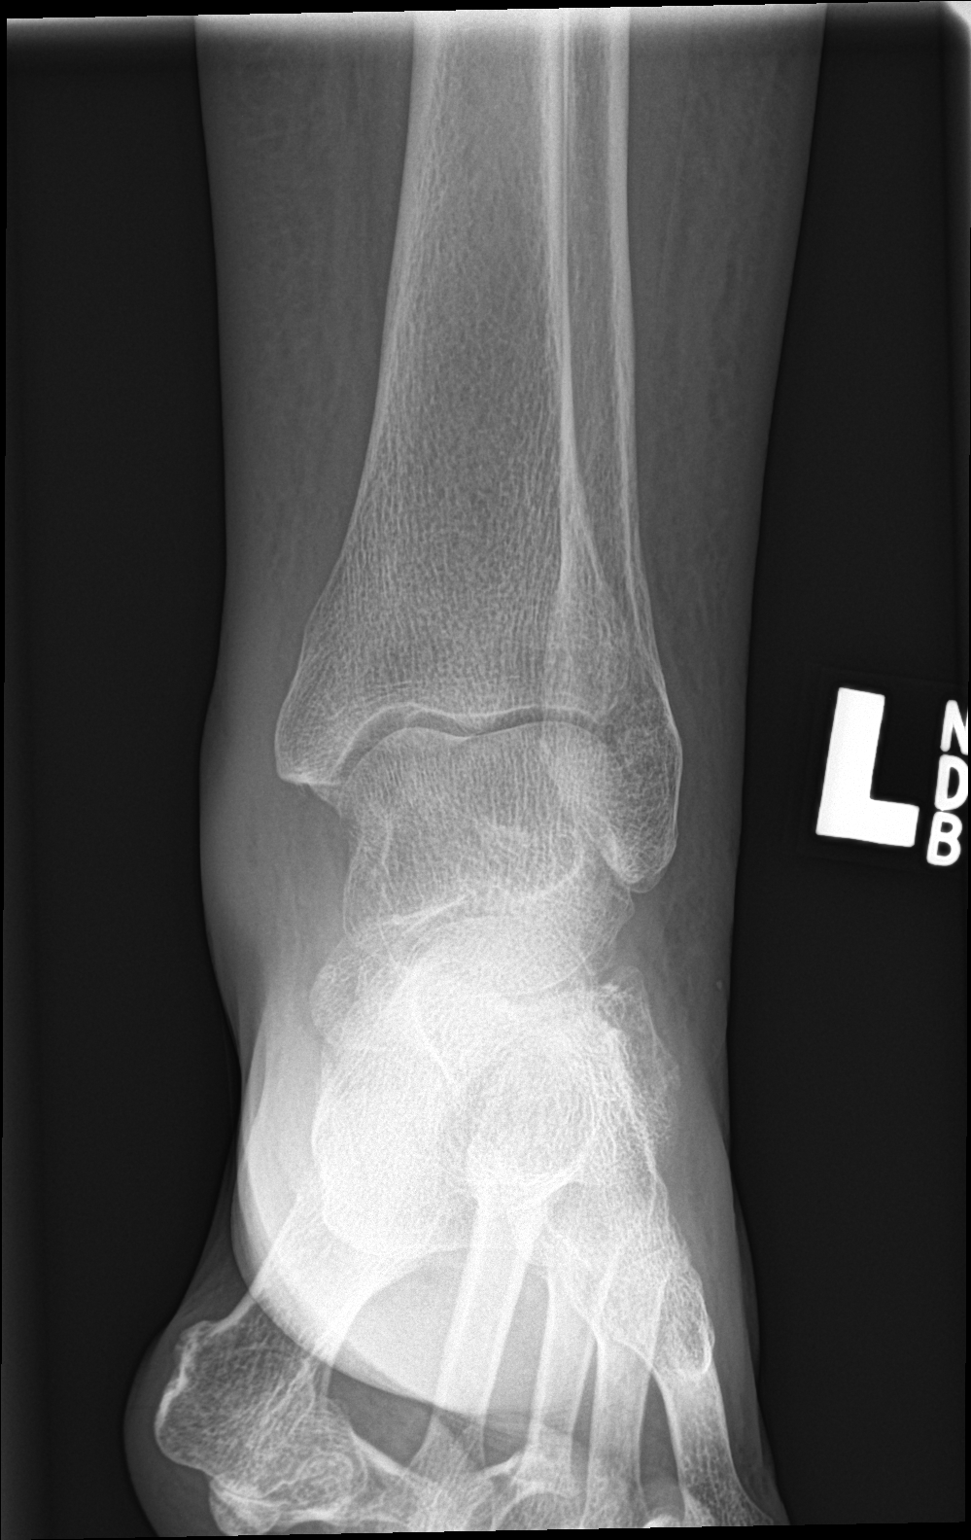

[ankle obl]
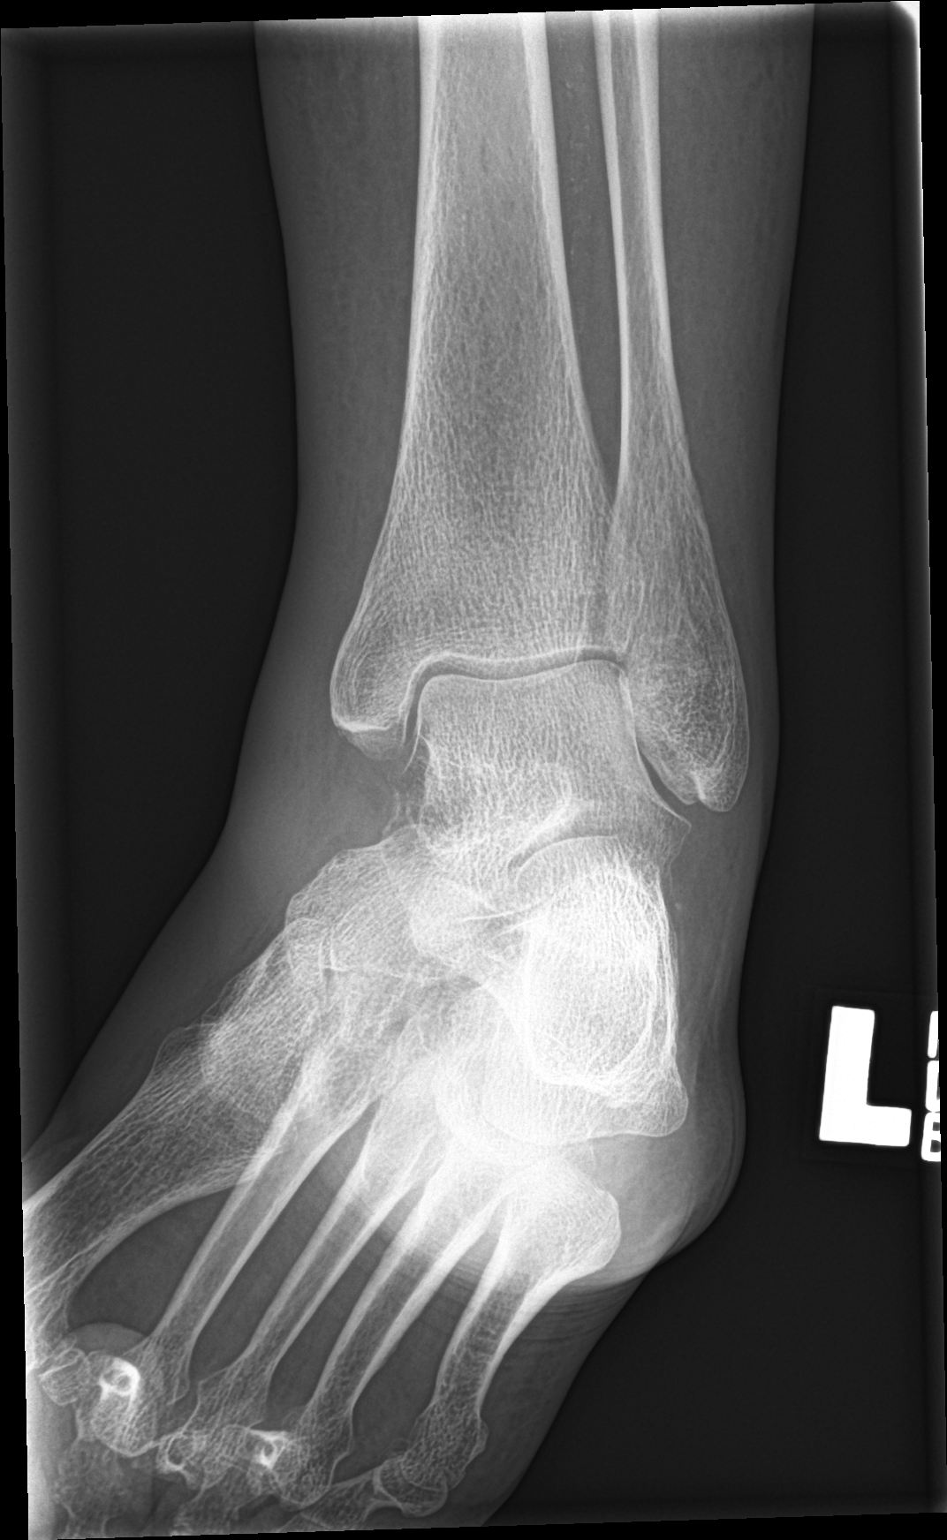

[ankle lat]
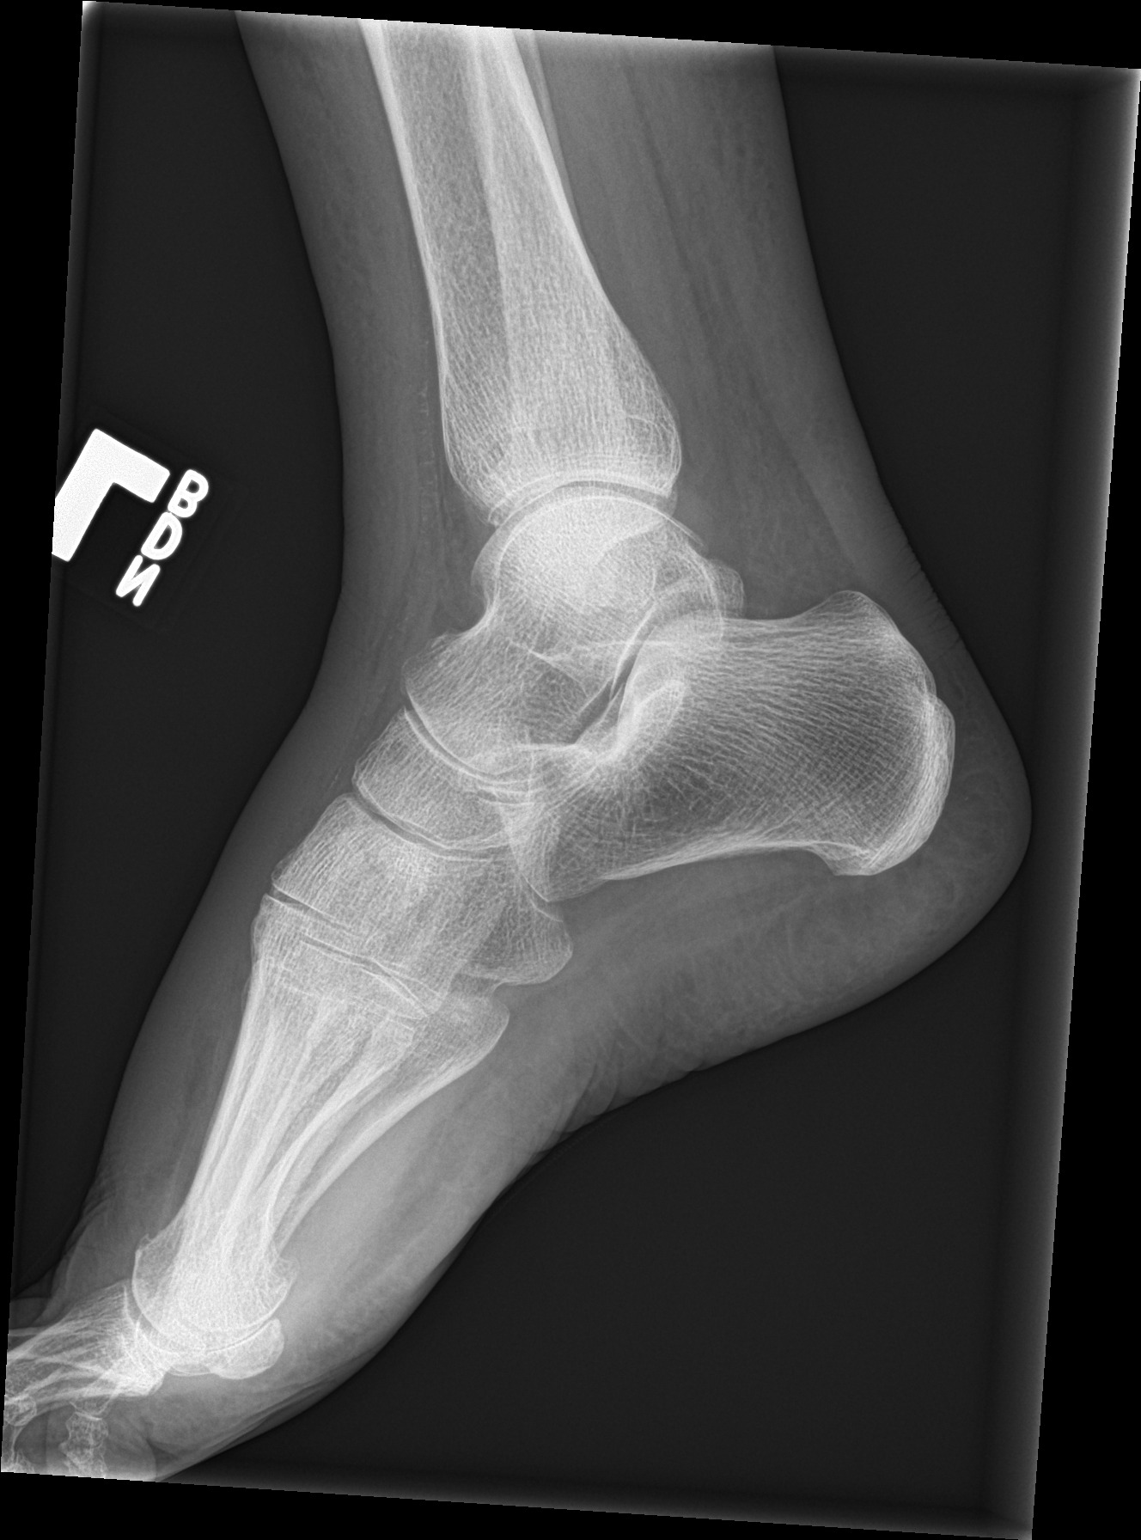

[3 of 3 positions shown; findings below may reference images not displayed]

FINDINGS: Diffuse soft tissue swelling LEFT lower leg and ankle into foot
especially dorsum of foot.

Bones appear demineralized.

Joint spaces preserved.

Nondisplaced oblique fracture of the distal LEFT fibular
metadiaphysis.

No additional fracture, dislocation, or bone destruction.
IMPRESSION: Nondisplaced oblique fracture of the distal LEFT fibula.

## 2018-10-01 DIAGNOSIS — M6281 Muscle weakness (generalized): Secondary | ICD-10-CM | POA: Diagnosis not present

## 2018-10-01 DIAGNOSIS — J449 Chronic obstructive pulmonary disease, unspecified: Secondary | ICD-10-CM | POA: Diagnosis not present

## 2018-10-01 DIAGNOSIS — G609 Hereditary and idiopathic neuropathy, unspecified: Secondary | ICD-10-CM | POA: Diagnosis not present

## 2018-10-01 DIAGNOSIS — R2681 Unsteadiness on feet: Secondary | ICD-10-CM | POA: Diagnosis not present

## 2018-10-01 DIAGNOSIS — F329 Major depressive disorder, single episode, unspecified: Secondary | ICD-10-CM | POA: Diagnosis not present

## 2018-10-01 DIAGNOSIS — G20 Parkinson's disease: Secondary | ICD-10-CM | POA: Diagnosis not present

## 2018-10-05 DIAGNOSIS — R2681 Unsteadiness on feet: Secondary | ICD-10-CM | POA: Diagnosis not present

## 2018-10-05 DIAGNOSIS — J449 Chronic obstructive pulmonary disease, unspecified: Secondary | ICD-10-CM | POA: Diagnosis not present

## 2018-10-05 DIAGNOSIS — G609 Hereditary and idiopathic neuropathy, unspecified: Secondary | ICD-10-CM | POA: Diagnosis not present

## 2018-10-05 DIAGNOSIS — G20 Parkinson's disease: Secondary | ICD-10-CM | POA: Diagnosis not present

## 2018-10-05 DIAGNOSIS — F329 Major depressive disorder, single episode, unspecified: Secondary | ICD-10-CM | POA: Diagnosis not present

## 2018-10-05 DIAGNOSIS — M6281 Muscle weakness (generalized): Secondary | ICD-10-CM | POA: Diagnosis not present

## 2018-10-06 DIAGNOSIS — J449 Chronic obstructive pulmonary disease, unspecified: Secondary | ICD-10-CM | POA: Diagnosis not present

## 2018-10-06 DIAGNOSIS — M6281 Muscle weakness (generalized): Secondary | ICD-10-CM | POA: Diagnosis not present

## 2018-10-06 DIAGNOSIS — F329 Major depressive disorder, single episode, unspecified: Secondary | ICD-10-CM | POA: Diagnosis not present

## 2018-10-06 DIAGNOSIS — R2681 Unsteadiness on feet: Secondary | ICD-10-CM | POA: Diagnosis not present

## 2018-10-06 DIAGNOSIS — G20 Parkinson's disease: Secondary | ICD-10-CM | POA: Diagnosis not present

## 2018-10-06 DIAGNOSIS — G609 Hereditary and idiopathic neuropathy, unspecified: Secondary | ICD-10-CM | POA: Diagnosis not present

## 2018-10-11 DIAGNOSIS — F329 Major depressive disorder, single episode, unspecified: Secondary | ICD-10-CM | POA: Diagnosis not present

## 2018-10-11 DIAGNOSIS — J449 Chronic obstructive pulmonary disease, unspecified: Secondary | ICD-10-CM | POA: Diagnosis not present

## 2018-10-11 DIAGNOSIS — R2681 Unsteadiness on feet: Secondary | ICD-10-CM | POA: Diagnosis not present

## 2018-10-11 DIAGNOSIS — G609 Hereditary and idiopathic neuropathy, unspecified: Secondary | ICD-10-CM | POA: Diagnosis not present

## 2018-10-11 DIAGNOSIS — M6281 Muscle weakness (generalized): Secondary | ICD-10-CM | POA: Diagnosis not present

## 2018-10-11 DIAGNOSIS — G20 Parkinson's disease: Secondary | ICD-10-CM | POA: Diagnosis not present

## 2018-10-13 DIAGNOSIS — G20 Parkinson's disease: Secondary | ICD-10-CM | POA: Diagnosis not present

## 2018-10-13 DIAGNOSIS — R2681 Unsteadiness on feet: Secondary | ICD-10-CM | POA: Diagnosis not present

## 2018-10-13 DIAGNOSIS — F329 Major depressive disorder, single episode, unspecified: Secondary | ICD-10-CM | POA: Diagnosis not present

## 2018-10-13 DIAGNOSIS — M6281 Muscle weakness (generalized): Secondary | ICD-10-CM | POA: Diagnosis not present

## 2018-10-13 DIAGNOSIS — J449 Chronic obstructive pulmonary disease, unspecified: Secondary | ICD-10-CM | POA: Diagnosis not present

## 2018-10-13 DIAGNOSIS — G609 Hereditary and idiopathic neuropathy, unspecified: Secondary | ICD-10-CM | POA: Diagnosis not present

## 2018-11-14 NOTE — Progress Notes (Signed)
MEDICARE ANNUAL WELLNESS VISIT AND FOLLOW UP  Assessment:    Encounter for Medicare annual wellness exam Will get DEXA with next MGM  LVH (left ventricular hypertrophy) Continue close monitoring of BP; follow up with cardiology as recommended.   Hypertensive heart disease without heart failure At goal; continue medications at this time - start keeping BP log and present at next visit - if consistently low, may try to taper off of BP for patient request of reduced pill burden Monitor blood pressure at home; call if consistently over 130/80 Continue DASH diet.   Reminder to go to the ER if any CP, SOB, nausea, dizziness, severe HA, changes vision/speech, left arm numbness and tingling and jaw pain.  Aortic atherosclerosis (HCC) Control blood pressure, cholesterol, glucose, increase exercise.   COPD with chronic bronchitis ([place]) Continue with respiratory medications; annual CXR; pulmonology f/u as indicated  Chronic respiratory failure with hypoxia, on home O2 therapy ([place]) Routinely evaluate need and status, continue home O2 therapy to maintain saturations above 92% Continue respiratory medications, daily prednisone 10 mg   Oropharyngeal dysphagia Continue to emphasize small bites, focus while eating, chew foods well  Reviewed medications today to discuss reducing pill burden  Gastroesophageal reflux disease without esophagitis Well managed on current medications Discussed diet, avoiding triggers and other lifestyle changes  Hereditary and idiopathic peripheral neuropathy Continue follow up with neurology  Parkinson's plus syndrome ([place]) Continue sinemet; follow up with neurology.   Osteoarthritis, unspecified osteoarthritis type, unspecified site Bilateral hands; s/p fusions and joint replacements? - per patient no longer with pain.   Urinary incontinence, unspecified type Wears depends; related to reduced mobility  Mixed hyperlipidemia Currently treated by  pravastatin 40 mg; d/c'd today after discussion of risks and benefits to reduce pill burden Continue low cholesterol diet and exercise.  Check lipid panel routinely.   Unstable Gait Continue with walker at home; refer back to PT as needed, discussed with patient  Prediabetes Recently well controlled A1Cs Discussed need to monitor with prendisone use Discussed disease and risks Discussed diet/exercise, weight management  Check A1Cs every 6 months  Vitamin D deficiency Continue supplementation for goal of 70-100 Check vitamin D level as needed  At high risk for falls Continue with walker; no falls this past year; refer back to PT as indicated  Depression, major, in remission ([place]) Doing well off of celexa;  xanax PRN for anxiety and O2 hunger Lifestyle discussed: diet/exerise, sleep hygiene, stress management, hydration  Protein-calorie malnutrition, severe Monitor albumin; continue ensure; encourage weight gain  Problem with medical care compliance Continue with home health medication management for poor compliance secondary to lack of knowledge; daughter is present today and will step in to help with this in the future with weekly pill box, monitoring and communication.   Pedal edema Improved; lasix PRN  Over 40 minutes of exam, counseling, chart review and critical decision making was performed Future Appointments  Date Time Provider [place]  12/14/2018  8:50 AM Alda Berthold, DO [facility] None  06/06/2019  2:00 PM [name], MD [facility] None     Plan:   During the course of the visit the patient was educated and counseled about appropriate screening and preventive services including:    Pneumococcal vaccine   Prevnar 13  Influenza vaccine  Td vaccine  Screening electrocardiogram  Bone densitometry screening  Colorectal cancer screening  Diabetes screening  Glaucoma screening  Nutrition counseling   Advanced directives:  requested   Subjective:  Marie [name]  is a 83 y.o. Caucasian female with hypertensive heart disease, COPD, parkinson's plus syndrome, oropharyngeal dysphagia, ?chronic respiratory failure with home O2 who presents for [facility] Annual Wellness Visit .    The daughter is still managing medications and coordinate pill box weekly.   On home O2 therapy - 2L at home as needed to maintain saturations above 88%. Has not needed much recently. Cough is significantly improved; the patient reports she is doing well today without concerns. She has unchanged bilateral edema, weight is the same. She is on prednisone 10 mg every day for her breathing and for DJD, doing well on it.   BMI is Body mass index is 25.78 kg/m., she has not been working on diet and exercise. Wt Readings from Last 3 Encounters:  11/16/18 150 lb 3.2 oz (68.1 kg)  08/22/18 154 lb (69.9 kg)  08/15/18 154 lb (69.9 kg)   Her cholesterol is at goal. The cholesterol last visit was:   Lab Results  Component Value Date   CHOL 173 05/12/2018   HDL 75 05/12/2018   LDLCALC 77 05/12/2018   TRIG 133 05/12/2018   CHOLHDL 2.3 05/12/2018    She has been working on diet and exercise for prediabetes, and denies paresthesia of the feet, polydipsia, polyuria and visual disturbances. Last A1C in the office was:  Lab Results  Component Value Date   HGBA1C 6.4 (H) 08/15/2018   Patient is on Vitamin D supplement.   Lab Results  Component Value Date   VD25OH 31 08/15/2018      Medication Review:  Current Outpatient Medications (Endocrine & Metabolic):  .  predniSONE (DELTASONE) 10 MG tablet, Take 1 tablet (10 mg total) by mouth daily with breakfast. Take 1 tablet 3 x/day or as directed  Current Outpatient Medications (Cardiovascular):  .  furosemide (LASIX) 20 MG tablet, Increase lasix for 40 mg (2 tabs) daily for 3-5 days until swelling resolves, then resume 20 mg (1 tab daily).  Current Outpatient Medications (Respiratory):  .   albuterol (PROVENTIL) (2.5 MG/3ML) 0.083% nebulizer solution, Use 1 ampule in nebulizer 4 times a day or every 4 hours as needed to rescue asthma. (Patient taking differently: Take 2.5 mg by nebulization See admin instructions. Use 1 ampule in nebulizer 4 times a day or every 4 hours as needed to rescue asthma.) .  TRELEGY ELLIPTA 100-62.5-25 MCG/INH AEPB, INHALE 1 PUFF ONCE DAILY  Current Outpatient Medications (Analgesics):  .  aspirin 81 MG tablet, Take 81 mg by mouth daily. Buys OTC   Current Outpatient Medications (Other):  Marland Kitchen  ALPRAZolam (XANAX) 0.5 MG tablet, Take 1 tablet up to 3 x / day as needed for Anxiety .  carbidopa-levodopa (SINEMET IR) 25-100 MG tablet, Take 2 tablet at 9am and 2 tablets at 3pm. (Patient taking differently: Take 2 tablets by mouth 2 (two) times daily. Take 2 tablet at 9am and 2 tablets at 3pm.) .  Cholecalciferol (VITAMIN D) 2000 units CAPS, Take 2 capsules (4,000 Units total) by mouth daily. Marland Kitchen  ENSURE (ENSURE), Take 237 mLs by mouth daily as needed.  Marland Kitchen  omeprazole (PRILOSEC) 40 MG capsule, Take 1 capsule (40 mg total) by mouth daily. .  OXYGEN, Inhale 2 L into the lungs daily as needed (for shortness of breath). Setting is on 2 when needed  .  potassium chloride SA (K-DUR,KLOR-CON) 20 MEQ tablet, Take 1 tablet by mouth daily. .  sertraline (ZOLOFT) 25 MG tablet, Take 1 tablet (25 mg total) by mouth daily.  Allergies  Allergen Reactions  . Demerol  [Meperidine Hcl] Other (See Comments)    Pt. Feels like she is suffocating  . Ertapenem Hives    Caused whole body to turn red Other reaction(s): Redness Caused whole body to turn red  . Codeine Other (See Comments)    hallucinations  . Demerol Other (See Comments)    hallucinations  . Morphine And Related Other (See Comments)    hallucinations  . Other Other (See Comments)    Invan 7- pt became red all over  . Sulfate Other (See Comments)    unknown    Current Problems (verified) Patient Active  Problem List   Diagnosis Date Noted  . Asthmatic bronchitis 01/17/2018  . Chronic diastolic CHF (congestive heart failure) ([place]) 12/15/2017  . Anxiety 12/15/2017  . Subvalvular aortic stenosis determined by imaging 11/15/2017  . Pedal edema 11/15/2017  . Problem with medical care compliance 11/11/2017  . Aortic atherosclerosis ([place]) 10/19/2017  . Dysphagia 09/28/2017  . Chronic respiratory failure with hypoxia, on home O2 therapy ([place]) 09/20/2017  . Hypertensive heart disease 03/29/2017  . Protein-calorie malnutrition, severe 03/17/2017  . Parkinson's plus syndrome ([place]) 03/04/2017  . Restrictive lung disease 02/24/2017  . LVH (left ventricular hypertrophy) 01/07/2017  . Depression, major, in remission ([place]) 09/22/2016  . At high risk for falls 10/16/2015  . Prediabetes 08/23/2014  . Vitamin D deficiency 08/23/2014  . Medication management 08/23/2014  . Unstable Gait 01/30/2014  . Incontinence   . Hyperlipidemia   . GERD (gastroesophageal reflux disease)   . Hereditary and idiopathic peripheral neuropathy   . Essential hypertension   . Tubulovillous adenoma of rectum 09/04/2011    Screening Tests Immunization History  Administered Date(s) Administered  . Influenza, High Dose Seasonal PF 08/23/2014, 07/24/2015, 08/13/2016, 07/12/2017, 07/13/2018  . Pneumococcal Conjugate-13 08/23/2014  . Pneumococcal-Unspecified 07/20/2003, 08/17/2013  . Td 08/17/2013  . Tdap 10/25/2012  . Zoster 03/19/2015   Preventative care: Last colonoscopy: 2012 Last mammogram: 04/2018 Last pap smear/pelvic exam: remote   DEXA: 2016 - osteopenia T-2.2 at last -willing to get WITH MGM next OV  Prior vaccinations: TD or Tdap: 2014  Influenza: 2019 Pneumococcal: 2014 Prevnar13: 2015 Shingles/Zostavax: 2016  Names of Other Physician/Practitioners you currently use: 1. [facility] here for primary care 2. Dr. Heide Guile, eye doctor, last visit 2019 3. Harrington Challenger,  dentist, last visit Nov 2018  Patient Care Team: [name], MD as PCP - General (Internal Medicine) Dorna Leitz, MD as Consulting Physician (Orthopedic Surgery) Teena Irani, MD (Inactive) as Consulting Physician (Gastroenterology) Garrel Ridgel, [place] as Consulting Physician (Podiatry) Juanito Doom, MD as Consulting Physician (Pulmonary Disease) Alda Berthold, DO as Consulting Physician (Neurology) Liane Comber, NP as Nurse Practitioner (Nurse Practitioner)  SURGICAL HISTORY She  has a past surgical history that includes Incontinence surgery; Rectal surgery; Appendectomy; Abdominal hysterectomy; Tonsillectomy; Eye surgery; Dilation and curettage of uterus; Rectal biopsy (09/21/2011); Finger surgery; External fixation leg (10/25/2012); Knee arthroscopy with medial menisectomy (Right, 09/19/2014); Knee arthroscopy with lateral menisectomy (Right, 09/19/2014); and Chondroplasty (09/19/2014).   FAMILY HISTORY Her family history includes COPD in her mother; Cancer in her sister; Heart attack in her father; High blood pressure in her mother.   SOCIAL HISTORY She  reports that she has never smoked. She has never used smokeless tobacco. She reports that she does not drink alcohol or use drugs.   MEDICARE WELLNESS OBJECTIVES: Physical activity: Current Exercise Habits: The patient does not participate in regular  exercise at present, Exercise limited by: orthopedic condition(s);respiratory conditions(s) Cardiac risk factors: Cardiac Risk Factors include: advanced age (>54men, >102 women);diabetes mellitus;dyslipidemia;hypertension;sedentary lifestyle Depression/mood screen:   Depression screen [facility] 2/9 11/16/2018  Decreased Interest 0  Down, Depressed, Hopeless 0  PHQ - 2 Score 0  Some recent data might be hidden    ADLs:  In your present state of health, do you have any difficulty performing the following activities: 11/16/2018 05/15/2018  Hearing? Y N  Vision? N N  Difficulty  concentrating or making decisions? N N  Walking or climbing stairs? Y N  Comment has a ramp -  Dressing or bathing? N N  Doing errands, shopping? Y N  Preparing Food and eating ? N -  Using the Toilet? Y -  In the past six months, have you accidently leaked urine? Y -  Do you have problems with loss of bowel control? N -  Managing your Medications? Y -  Managing your Finances? N -  Housekeeping or managing your Housekeeping? N -  Some recent data might be hidden     Cognitive Testing  Alert? Yes  Normal Appearance?Yes  Oriented to person? Yes  Place? Yes   Time? Yes  Recall of three objects?  Yes  Can perform simple calculations? Yes  Displays appropriate judgment? Questionable  Can read the correct time from a watch face?Yes  EOL planning: Does Patient Have a Medical Advance Directive?: Yes Type of Advance Directive: Healthcare Power of Attorney, Living will [place] in Chart?: No - copy requested  Review of Systems  Constitutional: Negative for malaise/fatigue and weight loss.  HENT: Negative for congestion, hearing loss, sore throat and tinnitus.   Eyes: Negative for blurred vision and double vision.  Respiratory: Negative for cough, shortness of breath and wheezing.   Cardiovascular: Negative for chest pain, palpitations, orthopnea, claudication and leg swelling.  Gastrointestinal: Negative for abdominal pain, blood in stool, constipation, diarrhea, heartburn, melena, nausea and vomiting.  Genitourinary: Negative.   Musculoskeletal: Negative for joint pain and myalgias.  Skin: Negative for rash.  Neurological: Negative for dizziness, tingling, sensory change, weakness and headaches.  Endo/Heme/Allergies: Negative for polydipsia.  Psychiatric/Behavioral: Negative.  Negative for depression and memory loss. The patient is not nervous/anxious and does not have insomnia.   All other systems reviewed and are negative.    Objective:     Today's  Vitals   11/16/18 1039  BP: 118/76  Pulse: 70  Temp: 97.7 F (36.5 C)  SpO2: 97%  Weight: 150 lb 3.2 oz (68.1 kg)  Height: 5\' 4"  (1.626 m)   Body mass index is 25.78 kg/m.  General appearance: alert, no distress, WD/WN, frail female HEENT: normocephalic, sclerae anicteric, TMs pearly, nares patent, no discharge or erythema, pharynx normal Oral cavity: MMM, no lesions Neck: supple, no lymphadenopathy, no thyromegaly, no masses Heart: RRR, normal S1, S2, with systolic murmur better with inspiration  Lungs: CTA bilaterally, course breath sounds left lower lung, no wheezes, rhonchi, or rales Abdomen: +bs, soft, non tender, non distended, no masses, no hepatomegaly, no splenomegaly Musculoskeletal: nontender, no swelling, no obvious deformity Extremities: Scant edema to R foot, no cyanosis, no clubbing Pulses: 2+ symmetric, upper and lower extremities, normal cap refill Neurological: alert, oriented x 3, CN2-12 intact, strength symmetrical upper extremities and lower extremities except some decreased strength right leg, Bilateral feet with good pulses, decreased sensation up 1/3 of shin, cracking and dry skin, hammer toes and absent left 3rd toe. Patient  in a wheel chair for long distances.  Psychiatric: normal affect, behavior normal, pleasant   Medicare Attestation I have personally reviewed: The patient's medical and social history Their use of alcohol, tobacco or illicit drugs Their current medications and supplements The patient's functional ability including ADLs,fall risks, home safety risks, cognitive, and hearing and visual impairment Diet and physical activities Evidence for depression or mood disorders  The patient's weight, height, BMI, and visual acuity have been recorded in the chart.  I have made referrals, counseling, and provided education to the patient based on review of the above and I have provided the patient with a written personalized care plan for preventive  services.     Vicie Mutters, PA-C   11/16/2018

## 2018-11-16 ENCOUNTER — Encounter: Payer: Self-pay | Admitting: Physician Assistant

## 2018-11-16 ENCOUNTER — Ambulatory Visit: Payer: Self-pay | Admitting: Internal Medicine

## 2018-11-16 ENCOUNTER — Ambulatory Visit (INDEPENDENT_AMBULATORY_CARE_PROVIDER_SITE_OTHER): Payer: Medicare Other | Admitting: Physician Assistant

## 2018-11-16 VITALS — BP 118/76 | HR 70 | Temp 97.7°F | Ht 64.0 in | Wt 150.2 lb

## 2018-11-16 DIAGNOSIS — Q244 Congenital subaortic stenosis: Secondary | ICD-10-CM

## 2018-11-16 DIAGNOSIS — Z9181 History of falling: Secondary | ICD-10-CM

## 2018-11-16 DIAGNOSIS — R269 Unspecified abnormalities of gait and mobility: Secondary | ICD-10-CM

## 2018-11-16 DIAGNOSIS — E782 Mixed hyperlipidemia: Secondary | ICD-10-CM | POA: Diagnosis not present

## 2018-11-16 DIAGNOSIS — R7303 Prediabetes: Secondary | ICD-10-CM | POA: Diagnosis not present

## 2018-11-16 DIAGNOSIS — I517 Cardiomegaly: Secondary | ICD-10-CM | POA: Diagnosis not present

## 2018-11-16 DIAGNOSIS — J4541 Moderate persistent asthma with (acute) exacerbation: Secondary | ICD-10-CM

## 2018-11-16 DIAGNOSIS — I700 Atherosclerosis of aorta: Secondary | ICD-10-CM

## 2018-11-16 DIAGNOSIS — G20C Parkinsonism, unspecified: Secondary | ICD-10-CM

## 2018-11-16 DIAGNOSIS — J9611 Chronic respiratory failure with hypoxia: Secondary | ICD-10-CM

## 2018-11-16 DIAGNOSIS — Z0000 Encounter for general adult medical examination without abnormal findings: Secondary | ICD-10-CM

## 2018-11-16 DIAGNOSIS — Z9981 Dependence on supplemental oxygen: Secondary | ICD-10-CM

## 2018-11-16 DIAGNOSIS — F325 Major depressive disorder, single episode, in full remission: Secondary | ICD-10-CM | POA: Diagnosis not present

## 2018-11-16 DIAGNOSIS — Z0001 Encounter for general adult medical examination with abnormal findings: Secondary | ICD-10-CM | POA: Diagnosis not present

## 2018-11-16 DIAGNOSIS — R6889 Other general symptoms and signs: Secondary | ICD-10-CM | POA: Diagnosis not present

## 2018-11-16 DIAGNOSIS — I10 Essential (primary) hypertension: Secondary | ICD-10-CM | POA: Diagnosis not present

## 2018-11-16 DIAGNOSIS — I119 Hypertensive heart disease without heart failure: Secondary | ICD-10-CM

## 2018-11-16 DIAGNOSIS — I5032 Chronic diastolic (congestive) heart failure: Secondary | ICD-10-CM | POA: Diagnosis not present

## 2018-11-16 DIAGNOSIS — J984 Other disorders of lung: Secondary | ICD-10-CM | POA: Diagnosis not present

## 2018-11-16 DIAGNOSIS — K219 Gastro-esophageal reflux disease without esophagitis: Secondary | ICD-10-CM

## 2018-11-16 DIAGNOSIS — E559 Vitamin D deficiency, unspecified: Secondary | ICD-10-CM

## 2018-11-16 DIAGNOSIS — E43 Unspecified severe protein-calorie malnutrition: Secondary | ICD-10-CM

## 2018-11-16 DIAGNOSIS — Z79899 Other long term (current) drug therapy: Secondary | ICD-10-CM

## 2018-11-16 DIAGNOSIS — G232 Striatonigral degeneration: Secondary | ICD-10-CM

## 2018-11-16 DIAGNOSIS — G609 Hereditary and idiopathic neuropathy, unspecified: Secondary | ICD-10-CM

## 2018-11-16 DIAGNOSIS — R32 Unspecified urinary incontinence: Secondary | ICD-10-CM

## 2018-11-16 DIAGNOSIS — M8580 Other specified disorders of bone density and structure, unspecified site: Secondary | ICD-10-CM

## 2018-11-16 NOTE — Patient Instructions (Signed)
  Marie [name] , Thank you for taking time to come for your Medicare Wellness Visit. I appreciate your ongoing commitment to your health goals. Please review the following plan we discussed and let me know if I can assist you in the future.    This is a list of the screening recommended for you and due dates:  Health Maintenance  Topic Date Due  . Tetanus Vaccine  08/18/2023  . Flu Shot  Completed  . DEXA scan (bone density measurement)  Completed  . Pneumonia vaccines  Completed    If you have been in the ER, hospital, or long term care facility, we want to see you within one week of discharge so please CONTACT OUR OFFICE at [phone].  We will try to contact you if we know about your admission/visit but we would love for you to contact us first.  We know your history and want to make sure all of your questions are answered and needs are taken care of after admission.   We also want our patients to know that we do NOT approve of [facility] soliciting our patients to do home visits and we do NOT approve of [facility] screenings. Please contact us before doing either of these "services".

## 2018-11-17 LAB — HEMOGLOBIN A1C
HEMOGLOBIN A1C: 6.4 %{Hb} — AB (ref <5.7)
Mean Plasma Glucose: 137 (calc)
eAG (mmol/L): 7.6 (calc)

## 2018-11-17 LAB — CBC WITH DIFFERENTIAL/PLATELET
Absolute Monocytes: 556 cells/uL (ref 200–950)
Basophils Absolute: 60 cells/uL (ref 0–200)
Basophils Relative: 0.9 %
EOS ABS: 60 {cells}/uL (ref 15–500)
Eosinophils Relative: 0.9 %
HEMATOCRIT: 40.8 % (ref 35.0–45.0)
Hemoglobin: 13.2 g/dL (ref 11.7–15.5)
Lymphs Abs: 2405 cells/uL (ref 850–3900)
MCH: 26.5 pg — AB (ref 27.0–33.0)
MCHC: 32.4 g/dL (ref 32.0–36.0)
MCV: 81.9 fL (ref 80.0–100.0)
MPV: 10.7 fL (ref 7.5–12.5)
Monocytes Relative: 8.3 %
Neutro Abs: 3618 cells/uL (ref 1500–7800)
Neutrophils Relative %: 54 %
Platelets: 262 10*3/uL (ref 140–400)
RBC: 4.98 10*6/uL (ref 3.80–5.10)
RDW: 13.9 % (ref 11.0–15.0)
Total Lymphocyte: 35.9 %
WBC: 6.7 10*3/uL (ref 3.8–10.8)

## 2018-11-17 LAB — COMPLETE METABOLIC PANEL WITH GFR
AG RATIO: 1.5 (calc) (ref 1.0–2.5)
ALT: 13 U/L (ref 6–29)
AST: 12 U/L (ref 10–35)
Albumin: 3.5 g/dL — ABNORMAL LOW (ref 3.6–5.1)
Alkaline phosphatase (APISO): 45 U/L (ref 33–130)
BUN/Creatinine Ratio: 31 (calc) — ABNORMAL HIGH (ref 6–22)
BUN: 27 mg/dL — ABNORMAL HIGH (ref 7–25)
CO2: 29 mmol/L (ref 20–32)
Calcium: 9.2 mg/dL (ref 8.6–10.4)
Chloride: 104 mmol/L (ref 98–110)
Creat: 0.88 mg/dL (ref 0.60–0.88)
GFR, Est African American: 72 mL/min/{1.73_m2} (ref >=60)
GFR, Est Non African American: 62 mL/min/{1.73_m2} (ref >=60)
Globulin: 2.4 g/dL (calc) (ref 1.9–3.7)
Glucose, Bld: 74 mg/dL (ref 65–99)
POTASSIUM: 3.7 mmol/L (ref 3.5–5.3)
Sodium: 143 mmol/L (ref 135–146)
Total Bilirubin: 0.5 mg/dL (ref 0.2–1.2)
Total Protein: 5.9 g/dL — ABNORMAL LOW (ref 6.1–8.1)

## 2018-11-17 LAB — LIPID PANEL
Cholesterol: 151 mg/dL (ref <200)
HDL: 56 mg/dL (ref >50)
LDL Cholesterol (Calc): 71 mg/dL (calc)
Non-HDL Cholesterol (Calc): 95 mg/dL (calc) (ref <130)
Total CHOL/HDL Ratio: 2.7 (calc) (ref <5.0)
Triglycerides: 160 mg/dL — ABNORMAL HIGH (ref <150)

## 2018-11-17 LAB — TSH: TSH: 1.93 m[IU]/L (ref 0.40–4.50)

## 2018-11-20 ENCOUNTER — Other Ambulatory Visit: Payer: Self-pay | Admitting: Physician Assistant

## 2018-11-20 DIAGNOSIS — F419 Anxiety disorder, unspecified: Secondary | ICD-10-CM

## 2018-12-01 ENCOUNTER — Other Ambulatory Visit: Payer: Self-pay

## 2018-12-01 ENCOUNTER — Emergency Department (HOSPITAL_COMMUNITY): Payer: Medicare Other

## 2018-12-01 ENCOUNTER — Inpatient Hospital Stay (HOSPITAL_COMMUNITY)
Admission: EM | Admit: 2018-12-01 | Discharge: 2018-12-05 | DRG: 190 | Disposition: A | Discharge status: Skilled Nursing Facility | Payer: Medicare Other | Attending: Internal Medicine | Admitting: Internal Medicine

## 2018-12-01 ENCOUNTER — Encounter (HOSPITAL_COMMUNITY): Payer: Self-pay

## 2018-12-01 DIAGNOSIS — I471 Supraventricular tachycardia: Secondary | ICD-10-CM | POA: Diagnosis present

## 2018-12-01 DIAGNOSIS — F411 Generalized anxiety disorder: Secondary | ICD-10-CM | POA: Diagnosis present

## 2018-12-01 DIAGNOSIS — I110 Hypertensive heart disease with heart failure: Secondary | ICD-10-CM | POA: Diagnosis present

## 2018-12-01 DIAGNOSIS — F05 Delirium due to known physiological condition: Secondary | ICD-10-CM | POA: Diagnosis not present

## 2018-12-01 DIAGNOSIS — G231 Progressive supranuclear ophthalmoplegia [Steele-Richardson-Olszewski]: Secondary | ICD-10-CM | POA: Diagnosis present

## 2018-12-01 DIAGNOSIS — Z825 Family history of asthma and other chronic lower respiratory diseases: Secondary | ICD-10-CM

## 2018-12-01 DIAGNOSIS — Z8249 Family history of ischemic heart disease and other diseases of the circulatory system: Secondary | ICD-10-CM

## 2018-12-01 DIAGNOSIS — R05 Cough: Secondary | ICD-10-CM | POA: Diagnosis not present

## 2018-12-01 DIAGNOSIS — I5032 Chronic diastolic (congestive) heart failure: Secondary | ICD-10-CM | POA: Diagnosis present

## 2018-12-01 DIAGNOSIS — R69 Illness, unspecified: Secondary | ICD-10-CM | POA: Diagnosis not present

## 2018-12-01 DIAGNOSIS — Z7952 Long term (current) use of systemic steroids: Secondary | ICD-10-CM

## 2018-12-01 DIAGNOSIS — Z7401 Bed confinement status: Secondary | ICD-10-CM | POA: Diagnosis not present

## 2018-12-01 DIAGNOSIS — R1312 Dysphagia, oropharyngeal phase: Secondary | ICD-10-CM | POA: Diagnosis present

## 2018-12-01 DIAGNOSIS — G20 Parkinson's disease: Secondary | ICD-10-CM | POA: Diagnosis present

## 2018-12-01 DIAGNOSIS — L89152 Pressure ulcer of sacral region, stage 2: Secondary | ICD-10-CM | POA: Diagnosis present

## 2018-12-01 DIAGNOSIS — E869 Volume depletion, unspecified: Secondary | ICD-10-CM | POA: Diagnosis present

## 2018-12-01 DIAGNOSIS — K219 Gastro-esophageal reflux disease without esophagitis: Secondary | ICD-10-CM | POA: Diagnosis present

## 2018-12-01 DIAGNOSIS — G232 Striatonigral degeneration: Secondary | ICD-10-CM | POA: Diagnosis not present

## 2018-12-01 DIAGNOSIS — G629 Polyneuropathy, unspecified: Secondary | ICD-10-CM | POA: Diagnosis present

## 2018-12-01 DIAGNOSIS — R0902 Hypoxemia: Secondary | ICD-10-CM

## 2018-12-01 DIAGNOSIS — F419 Anxiety disorder, unspecified: Secondary | ICD-10-CM | POA: Diagnosis present

## 2018-12-01 DIAGNOSIS — E43 Unspecified severe protein-calorie malnutrition: Secondary | ICD-10-CM | POA: Diagnosis present

## 2018-12-01 DIAGNOSIS — J9621 Acute and chronic respiratory failure with hypoxia: Secondary | ICD-10-CM | POA: Diagnosis present

## 2018-12-01 DIAGNOSIS — N179 Acute kidney failure, unspecified: Secondary | ICD-10-CM | POA: Diagnosis present

## 2018-12-01 DIAGNOSIS — J988 Other specified respiratory disorders: Secondary | ICD-10-CM | POA: Diagnosis present

## 2018-12-01 DIAGNOSIS — E46 Unspecified protein-calorie malnutrition: Secondary | ICD-10-CM | POA: Diagnosis present

## 2018-12-01 DIAGNOSIS — J441 Chronic obstructive pulmonary disease with (acute) exacerbation: Secondary | ICD-10-CM

## 2018-12-01 DIAGNOSIS — Z66 Do not resuscitate: Secondary | ICD-10-CM | POA: Diagnosis present

## 2018-12-01 DIAGNOSIS — F339 Major depressive disorder, recurrent, unspecified: Secondary | ICD-10-CM | POA: Diagnosis present

## 2018-12-01 DIAGNOSIS — F329 Major depressive disorder, single episode, unspecified: Secondary | ICD-10-CM | POA: Diagnosis present

## 2018-12-01 DIAGNOSIS — R509 Fever, unspecified: Secondary | ICD-10-CM | POA: Diagnosis not present

## 2018-12-01 DIAGNOSIS — R531 Weakness: Secondary | ICD-10-CM | POA: Diagnosis not present

## 2018-12-01 DIAGNOSIS — E876 Hypokalemia: Secondary | ICD-10-CM | POA: Diagnosis present

## 2018-12-01 DIAGNOSIS — J449 Chronic obstructive pulmonary disease, unspecified: Secondary | ICD-10-CM | POA: Diagnosis present

## 2018-12-01 DIAGNOSIS — R5381 Other malaise: Secondary | ICD-10-CM | POA: Diagnosis not present

## 2018-12-01 DIAGNOSIS — R7302 Impaired glucose tolerance (oral): Secondary | ICD-10-CM | POA: Diagnosis present

## 2018-12-01 DIAGNOSIS — M6281 Muscle weakness (generalized): Secondary | ICD-10-CM | POA: Diagnosis present

## 2018-12-01 DIAGNOSIS — G9009 Other idiopathic peripheral autonomic neuropathy: Secondary | ICD-10-CM | POA: Diagnosis present

## 2018-12-01 DIAGNOSIS — R6511 Systemic inflammatory response syndrome (SIRS) of non-infectious origin with acute organ dysfunction: Secondary | ICD-10-CM | POA: Diagnosis present

## 2018-12-01 DIAGNOSIS — Z7982 Long term (current) use of aspirin: Secondary | ICD-10-CM | POA: Diagnosis not present

## 2018-12-01 DIAGNOSIS — R000 Tachycardia, unspecified: Secondary | ICD-10-CM | POA: Diagnosis present

## 2018-12-01 LAB — CBC WITH DIFFERENTIAL/PLATELET
Abs Immature Granulocytes: 0.02 10*3/uL (ref 0.00–0.07)
Basophils Absolute: 0 10*3/uL (ref 0.0–0.1)
Basophils Relative: 1 %
EOS ABS: 0 10*3/uL (ref 0.0–0.5)
Eosinophils Relative: 0 %
HCT: 46.3 % — ABNORMAL HIGH (ref 36.0–46.0)
Hemoglobin: 13.6 g/dL (ref 12.0–15.0)
Immature Granulocytes: 0 %
Lymphocytes Relative: 23 %
Lymphs Abs: 1.2 10*3/uL (ref 0.7–4.0)
MCH: 25.7 pg — AB (ref 26.0–34.0)
MCHC: 29.4 g/dL — AB (ref 30.0–36.0)
MCV: 87.4 fL (ref 80.0–100.0)
MONO ABS: 0.6 10*3/uL (ref 0.1–1.0)
Monocytes Relative: 12 %
Neutro Abs: 3.3 10*3/uL (ref 1.7–7.7)
Neutrophils Relative %: 64 %
Platelets: 183 10*3/uL (ref 150–400)
RBC: 5.3 MIL/uL — ABNORMAL HIGH (ref 3.87–5.11)
RDW: 15.2 % (ref 11.5–15.5)
WBC: 5.2 10*3/uL (ref 4.0–10.5)
nRBC: 0 % (ref 0.0–0.2)

## 2018-12-01 LAB — BASIC METABOLIC PANEL
Anion gap: 8 (ref 5–15)
BUN: 27 mg/dL — ABNORMAL HIGH (ref 8–23)
CO2: 29 mmol/L (ref 22–32)
Calcium: 8.4 mg/dL — ABNORMAL LOW (ref 8.9–10.3)
Chloride: 104 mmol/L (ref 98–111)
Creatinine, Ser: 1.46 mg/dL — ABNORMAL HIGH (ref 0.44–1.00)
GFR calc Af Amer: 39 mL/min — ABNORMAL LOW (ref >60)
GFR calc non Af Amer: 34 mL/min — ABNORMAL LOW (ref >60)
Glucose, Bld: 105 mg/dL — ABNORMAL HIGH (ref 70–99)
Potassium: 3.3 mmol/L — ABNORMAL LOW (ref 3.5–5.1)
Sodium: 141 mmol/L (ref 135–145)

## 2018-12-01 LAB — GLUCOSE, CAPILLARY
Glucose-Capillary: 160 mg/dL — ABNORMAL HIGH (ref 70–99)
Glucose-Capillary: 146 mg/dL — ABNORMAL HIGH (ref 70–99)

## 2018-12-01 LAB — LACTIC ACID, PLASMA: Lactic Acid, Venous: 1.5 mmol/L (ref 0.5–1.9)

## 2018-12-01 LAB — INFLUENZA PANEL BY PCR (TYPE A & B)
Influenza A By PCR: NEGATIVE
Influenza B By PCR: NEGATIVE

## 2018-12-01 LAB — BRAIN NATRIURETIC PEPTIDE: B Natriuretic Peptide: 309 pg/mL — ABNORMAL HIGH (ref 0.0–100.0)

## 2018-12-01 MED ORDER — IPRATROPIUM-ALBUTEROL 0.5-2.5 (3) MG/3ML IN SOLN
3.0000 mL | Freq: Once | RESPIRATORY_TRACT | Status: AC
  Administered 2018-12-01: 3 mL via RESPIRATORY_TRACT
  Filled 2018-12-01: qty 3

## 2018-12-01 MED ORDER — ACETAMINOPHEN 650 MG RE SUPP: 650.0000 mg | Freq: Four times a day (QID) | RECTAL | Status: DC | PRN

## 2018-12-01 MED ORDER — INSULIN ASPART 100 UNIT/ML ~~LOC~~ SOLN
0.0000 [IU] | Freq: Three times a day (TID) | SUBCUTANEOUS | Status: DC
  Administered 2018-12-01 – 2018-12-02 (×2): 2 [IU] via SUBCUTANEOUS
  Administered 2018-12-02: 1 [IU] via SUBCUTANEOUS
  Administered 2018-12-03 (×2): 2 [IU] via SUBCUTANEOUS
  Administered 2018-12-03: 1 [IU] via SUBCUTANEOUS
  Administered 2018-12-04: 2 [IU] via SUBCUTANEOUS
  Administered 2018-12-04: 3 [IU] via SUBCUTANEOUS
  Administered 2018-12-05 (×3): 2 [IU] via SUBCUTANEOUS

## 2018-12-01 MED ORDER — SODIUM CHLORIDE 0.9 % IV SOLN
2.0000 g | INTRAVENOUS | Status: DC
  Administered 2018-12-02 – 2018-12-03 (×2): 2 g via INTRAVENOUS
  Filled 2018-12-01 (×4): qty 2

## 2018-12-01 MED ORDER — VITAMIN D 25 MCG (1000 UNIT) PO TABS
4000.0000 [IU] | ORAL_TABLET | Freq: Every day | ORAL | Status: DC
  Administered 2018-12-01 – 2018-12-05 (×5): 4000 [IU] via ORAL
  Filled 2018-12-01 (×5): qty 4

## 2018-12-01 MED ORDER — CARBIDOPA-LEVODOPA 25-100 MG PO TABS
2.0000 | ORAL_TABLET | Freq: Two times a day (BID) | ORAL | Status: DC
  Administered 2018-12-01 – 2018-12-05 (×8): 2 via ORAL
  Filled 2018-12-01 (×9): qty 2

## 2018-12-01 MED ORDER — ONDANSETRON HCL 4 MG PO TABS: 4.0000 mg | ORAL_TABLET | Freq: Four times a day (QID) | ORAL | Status: DC | PRN

## 2018-12-01 MED ORDER — IPRATROPIUM-ALBUTEROL 0.5-2.5 (3) MG/3ML IN SOLN
3.0000 mL | Freq: Four times a day (QID) | RESPIRATORY_TRACT | Status: DC
  Administered 2018-12-01 – 2018-12-05 (×13): 3 mL via RESPIRATORY_TRACT
  Filled 2018-12-01 (×12): qty 3

## 2018-12-01 MED ORDER — ONDANSETRON HCL 4 MG/2ML IJ SOLN: 4.0000 mg | Freq: Four times a day (QID) | INTRAMUSCULAR | Status: DC | PRN

## 2018-12-01 MED ORDER — BUDESONIDE 0.5 MG/2ML IN SUSP
0.5000 mg | Freq: Two times a day (BID) | RESPIRATORY_TRACT | Status: DC
  Administered 2018-12-01 – 2018-12-05 (×8): 0.5 mg via RESPIRATORY_TRACT
  Filled 2018-12-01 (×8): qty 2

## 2018-12-01 MED ORDER — POTASSIUM CHLORIDE CRYS ER 20 MEQ PO TBCR
20.0000 meq | EXTENDED_RELEASE_TABLET | Freq: Every day | ORAL | Status: DC
  Administered 2018-12-01 – 2018-12-05 (×5): 20 meq via ORAL
  Filled 2018-12-01 (×5): qty 1

## 2018-12-01 MED ORDER — SERTRALINE HCL 50 MG PO TABS
25.0000 mg | ORAL_TABLET | Freq: Every day | ORAL | Status: DC
  Administered 2018-12-01 – 2018-12-05 (×5): 25 mg via ORAL
  Filled 2018-12-01 (×5): qty 1

## 2018-12-01 MED ORDER — ENOXAPARIN SODIUM 30 MG/0.3ML ~~LOC~~ SOLN
30.0000 mg | SUBCUTANEOUS | Status: DC
  Administered 2018-12-01: 30 mg via SUBCUTANEOUS
  Filled 2018-12-01: qty 0.3

## 2018-12-01 MED ORDER — METHYLPREDNISOLONE SODIUM SUCC 125 MG IJ SOLR
60.0000 mg | Freq: Four times a day (QID) | INTRAMUSCULAR | Status: DC
  Administered 2018-12-01 – 2018-12-05 (×15): 60 mg via INTRAVENOUS
  Filled 2018-12-01 (×15): qty 2

## 2018-12-01 MED ORDER — POTASSIUM CHLORIDE IN NACL 20-0.9 MEQ/L-% IV SOLN
INTRAVENOUS | Status: AC
  Administered 2018-12-01 – 2018-12-02 (×2): via INTRAVENOUS

## 2018-12-01 MED ORDER — ALPRAZOLAM 0.25 MG PO TABS
0.2500 mg | ORAL_TABLET | Freq: Three times a day (TID) | ORAL | Status: DC | PRN
  Filled 2018-12-01: qty 1

## 2018-12-01 MED ORDER — ASPIRIN EC 81 MG PO TBEC
81.0000 mg | DELAYED_RELEASE_TABLET | Freq: Every day | ORAL | Status: DC
  Administered 2018-12-01 – 2018-12-05 (×5): 81 mg via ORAL
  Filled 2018-12-01 (×5): qty 1

## 2018-12-01 MED ORDER — ENOXAPARIN SODIUM 40 MG/0.4ML ~~LOC~~ SOLN: 40.0000 mg | SUBCUTANEOUS | Status: DC

## 2018-12-01 MED ORDER — SODIUM CHLORIDE 0.9 % IV SOLN
2.0000 g | Freq: Once | INTRAVENOUS | Status: AC
  Administered 2018-12-01: 2 g via INTRAVENOUS
  Filled 2018-12-01: qty 2

## 2018-12-01 MED ORDER — ACETAMINOPHEN 325 MG PO TABS: 650.0000 mg | ORAL_TABLET | Freq: Four times a day (QID) | ORAL | Status: DC | PRN

## 2018-12-01 MED ORDER — PANTOPRAZOLE SODIUM 40 MG PO TBEC
80.0000 mg | DELAYED_RELEASE_TABLET | Freq: Every day | ORAL | Status: DC
  Administered 2018-12-01 – 2018-12-05 (×5): 80 mg via ORAL
  Filled 2018-12-01 (×5): qty 2

## 2018-12-01 MED ORDER — METHYLPREDNISOLONE SODIUM SUCC 125 MG IJ SOLR
125.0000 mg | Freq: Once | INTRAMUSCULAR | Status: AC
  Administered 2018-12-01: 125 mg via INTRAVENOUS
  Filled 2018-12-01: qty 2

## 2018-12-01 NOTE — H&P (Signed)
History and Physical  Marie [name] ZWC:[id number] DOB: [birth year] DOA: 12/01/2018   PCP: [name], MD   Patient coming from: Home  Chief Complaint: generalized weakness, cough, sob  HPI:  Marie [name] is a 83 y.o. female with medical history of progressive supranuclear palsy/Parkinson's plus syndrome, COPD, peripheral neuropathy,, anxiety, GERD, chronic respiratory failure on as needed 2 L at home, prediabetes, and depression presenting with shortness of breath, coughing, congestion, generalized weakness that has progressively worsened since 11/28/2018.  Her symptoms began with coughing and congestion on 11/28/2018.  Symptoms continue to worsen with worsening shortness of breath and generalized weakness to the point where she was not able to get out of bed.  At baseline, the patient uses a walker to ambulate.  The patient has been using her inhalers without much improvement.  Because of worsening shortness of breath and wheezing, the patient's family brought the patient to the emergency department for further evaluation.  The patient denies any fevers, chills, chest pain, headache, sore throat, neck pain, nausea, vomiting, diarrhea, abdominal pain, dysuria, hematuria.  She does complain of cough and shortness of breath.  There is no hemoptysis. In the emergency department, the patient had a temperature 100.6 F, but she was hemodynamically stable without any respiratory distress.  The patient oxygen saturation 88% on 2L.  She was given Solu-Medrol and bronchodilators.  Assessment/Plan: Acute on chronic respiratory failure with hypoxia -Secondary COPD exacerbation which is likely incited from respiratory infection -Chronically on as needed oxygen nasal cannula -Presently stable on 3 L -Wean oxygen as tolerated back to room air  COPD exacerbation -Start Pulmicort -Start duo nebs -Start Solu-Medrol -Influenza PCR  Fever/SIRS -Blood cultures x2 sets -UA and urine  culture -Influenza PCR -Start empiric cefepime pending culture data -check lactate  Acute kidney injury -Baseline creatinine 0.8-1.0 -Presented with serum creatinine of 1.46 -Secondary to volume depletion -IV fluids -A.m. BMP -Hold furosemide  Impaired glucose tolerance -11/16/2018 hemoglobin A1c 6.4 -Start NovoLog sliding scale as IV steroids will likely increase CBGs  Hypokalemia -Replete -Check magnesium  Anxiety/depression -Continue home dose alprazolam and Zoloft  Parkinson's plus syndrome/PSP -Continue home dose of Sinemet  Severe malnutrition -consult nutrition        Past Medical History:  Diagnosis Date  . Balance disorder 2008.     Falls a lot.  She can be standing and then leans too far over to one side   . Bulging disc   . Chronic bronchitis ([place])    due to congestion at times, on prednisone and advair  . COPD (chronic obstructive pulmonary disease) ([place])   . COPD with acute exacerbation ([place]) 12/15/2017  . Depression    many years ago  . GERD (gastroesophageal reflux disease)   . Hearing loss    mild  . Hyperlipidemia   . Hypertension   . Iatrogenic adrenal insufficiency ([place])   . Incontinence    not indicated at this visit.  Marland Kitchen LVH (left ventricular hypertrophy) due to hypertensive disease    a. 02/2017: echo showing an EF of 65-70% with moderate LVH, hyperdynamic LV with subvalvular gradient of 70 mmHg, SAM, and mild MR  . Neuropathy    legs stay numb   . Osteoarthritis    hands,   . Osteoporosis   . Shortness of breath dyspnea   . Tubulovillous adenoma of rectum   . Wears dentures   . Wears glasses    Past Surgical History:  Procedure Laterality Date  .  ABDOMINAL HYSTERECTOMY    . APPENDECTOMY    . CHONDROPLASTY  09/19/2014   Procedure: CHONDROPLASTY;  Surgeon: Alta Corning, MD;  Location: [place];  Service: Orthopedics;;  . DILATION AND CURETTAGE OF UTERUS    . EXTERNAL FIXATION LEG  10/25/2012   Procedure:  EXTERNAL FIXATION LEG;  Surgeon: Rozanna Box, MD;  Location: [place];  Service: Orthopedics;  Laterality: Right;  . EYE SURGERY     bilateral cataract surgery and lens implant  . FINGER SURGERY     fusions and debridements for OA  . INCONTINENCE SURGERY     multiple procedures, not cured  . KNEE ARTHROSCOPY WITH LATERAL MENISECTOMY Right 09/19/2014   Procedure: KNEE ARTHROSCOPY WITH LATERAL MENISECTOMY;  Surgeon: Alta Corning, MD;  Location: [place];  Service: Orthopedics;  Laterality: Right;  . KNEE ARTHROSCOPY WITH MEDIAL MENISECTOMY Right 09/19/2014   Procedure: RIGHT KNEE ARTHROSCOPY WITH MEDIAL AND LATERAL MENISECTOMIES. CHONDROPLASTY OF PATELLA-FEMORAL JOINT;  Surgeon: Alta Corning, MD;  Location: [place];  Service: Orthopedics;  Laterality: Right;  . RECTAL BIOPSY  09/21/2011   Procedure: BIOPSY RECTAL;  Surgeon: Merrie Roof, MD;  Location: [place];  Service: General;  Laterality: N/A;  3-4 cm  . RECTAL SURGERY     by dr. Marlou Starks, removal of polyp  . TONSILLECTOMY     Social History:  reports that she has never smoked. She has never used smokeless tobacco. She reports that she does not drink alcohol or use drugs.   Family History  Problem Relation Age of Onset  . Cancer Sister        pt unaware of what kind  . High blood pressure Mother   . COPD Mother   . Heart attack Father      Allergies  Allergen Reactions  . Demerol  [Meperidine Hcl] Other (See Comments)    Pt. Feels like she is suffocating  . Ertapenem Hives    Caused whole body to turn red Other reaction(s): Redness Caused whole body to turn red  . Codeine Other (See Comments)    hallucinations  . Demerol Other (See Comments)    hallucinations  . Morphine And Related Other (See Comments)    hallucinations  . Other Other (See Comments)    Invan 7- pt became red all over  . Sulfate Other (See Comments)    unknown     Prior to Admission medications   Medication Sig  Start Date End Date Taking? Authorizing Provider  albuterol (PROVENTIL) (2.5 MG/3ML) 0.083% nebulizer solution Use 1 ampule in nebulizer 4 times a day or every 4 hours as needed to rescue asthma. Patient taking differently: Take 2.5 mg by nebulization See admin instructions. Use 1 ampule in nebulizer 4 times a day or every 4 hours as needed to rescue asthma. 04/04/18 04/04/19  Isaac Bliss, Rayford Halsted, MD  ALPRAZolam Duanne Moron) 0.5 MG tablet Take 1/2-1 tablet 2 - 3 x /day ONLY if needed for Anxiety Attack &  limit to 5 days /week to avoid addiction 11/20/18   [name], MD  aspirin 81 MG tablet Take 81 mg by mouth daily. Buys OTC    [provider]  carbidopa-levodopa (SINEMET IR) 25-100 MG tablet Take 2 tablet at 9am and 2 tablets at 3pm. Patient taking differently: Take 2 tablets by mouth 2 (two) times daily. Take 2 tablet at 9am and 2 tablets at 3pm. 03/04/18   Alda Berthold, DO  Cholecalciferol (VITAMIN D) 2000 units CAPS Take 2 capsules (4,000 Units total) by mouth daily. 08/16/18   Liane Comber, NP  ENSURE (ENSURE) Take 237 mLs by mouth daily as needed.     [provider]  furosemide (LASIX) 20 MG tablet Increase lasix for 40 mg (2 tabs) daily for 3-5 days until swelling resolves, then resume 20 mg (1 tab daily). 08/15/18   Liane Comber, NP  omeprazole (PRILOSEC) 40 MG capsule Take 1 capsule (40 mg total) by mouth daily. 09/19/18   Vicie Mutters, PA-C  OXYGEN Inhale 2 L into the lungs daily as needed (for shortness of breath). Setting is on 2 when needed     [provider]  potassium chloride SA (K-DUR,KLOR-CON) 20 MEQ tablet Take 1 tablet by mouth daily. 09/19/18   Vicie Mutters, PA-C  predniSONE (DELTASONE) 10 MG tablet Take 1 tablet (10 mg total) by mouth daily with breakfast. Take 1 tablet 3 x/day or as directed 08/22/18   Liane Comber, NP  sertraline (ZOLOFT) 25 MG tablet Take 1 tablet (25 mg total) by mouth daily. 09/19/18   Vicie Mutters, PA-C   TRELEGY ELLIPTA 100-62.5-25 MCG/INH AEPB INHALE 1 PUFF ONCE DAILY 05/04/18   [provider]    Review of Systems:  Constitutional:  No weight loss, night sweats, Fevers, chills, fatigue.  Head&Eyes: No headache.  No vision loss.  No eye pain or scotoma ENT:  No Difficulty swallowing,Tooth/dental problems,Sore throat,  No ear ache, post nasal drip,  Cardio-vascular:  No chest pain, Orthopnea, PND  dizziness, palpitations  GI:  No  abdominal pain, nausea, vomiting, diarrhea, loss of appetite, hematochezia, melena, heartburn, indigestion, Resp:  No coughing up of blood .No chest wall deformity  Skin:  no rash or lesions.  GU:  no dysuria, change in color of urine, no urgency or frequency. No flank pain.  Musculoskeletal:  No joint pain or swelling. No decreased range of motion. No back pain.  Psych:  No change in mood or affect. No depression or anxiety. Neurologic: No headache, no dysesthesia, no focal weakness, no vision loss. No syncope  Physical Exam: Vitals:   12/01/18 0930 12/01/18 0944 12/01/18 1024 12/01/18 1030  BP: (!) 120/54     Pulse:      Resp:      Temp:   (!) 100.6 F (38.1 C)   TempSrc:   Rectal   SpO2:  95%  96%  Weight:      Height:       General:  A&O x 3, NAD, nontoxic, pleasant/cooperative Head/Eye: No conjunctival hemorrhage, no icterus, [place]/AT, No nystagmus ENT:  No icterus,  No thrush, good dentition, no pharyngeal exudate Neck:  No masses, no lymphadenpathy, no bruits CV:  RRR, no rub, no gallop, no S3 Lung:  Bilateral rales. Bilateral wheeze Abdomen: soft/NT, +BS, nondistended, no peritoneal signs Ext: No cyanosis, No rashes, No petechiae, No lymphangitis, 1 + LE edema Neuro: CNII-XII intact, strength 4/5 in bilateral upper and lower extremities, no dysmetria  Labs on Admission:  Basic Metabolic Panel: Recent Labs  Lab 12/01/18 0951  NA 141  K 3.3*  CL 104  CO2 29  GLUCOSE 105*  BUN 27*  CREATININE 1.46*  CALCIUM 8.4*    Liver Function Tests: No results for input(s): AST, ALT, ALKPHOS, BILITOT, PROT, ALBUMIN in the last 168 hours. No results for input(s): LIPASE, AMYLASE in the last 168 hours. No results for input(s): AMMONIA in the last 168 hours. CBC: Recent Labs  Lab  12/01/18 0951  WBC 5.2  NEUTROABS 3.3  HGB 13.6  HCT 46.3*  MCV 87.4  PLT 183   Coagulation Profile: No results for input(s): INR, PROTIME in the last 168 hours. Cardiac Enzymes: No results for input(s): CKTOTAL, CKMB, CKMBINDEX, TROPONINI in the last 168 hours. BNP: Invalid input(s): POCBNP CBG: No results for input(s): GLUCAP in the last 168 hours. Urine analysis:    Component Value Date/Time   COLORURINE YELLOW 05/16/2018 [place] 05/16/2018 1453   LABSPEC 1.014 05/16/2018 1453   PHURINE 6.0 05/16/2018 1453   GLUCOSEU NEGATIVE 05/16/2018 1453   HGBUR NEGATIVE 05/16/2018 1453   BILIRUBINUR NEGATIVE 01/22/2018 2300   KETONESUR NEGATIVE 05/16/2018 1453   PROTEINUR NEGATIVE 05/16/2018 1453   UROBILINOGEN 0.2 04/16/2015 2310   NITRITE NEGATIVE 05/16/2018 1453   LEUKOCYTESUR NEGATIVE 05/16/2018 1453   Sepsis Labs: @LABRCNTIP (procalcitonin:4,lacticidven:4) )No results found for this or any previous visit (from the past 240 hour(s)).   Radiological Exams on Admission: Dg Chest 2 View  Result Date: 12/01/2018 CLINICAL DATA:  Cough Pt brought in by EMS due to cough and weakness. Reports she has had a dry cough since Monday and has progressively gotten weak. Denies pain EXAM: CHEST - 2 VIEW COMPARISON:  04/03/2018 FINDINGS: Cardiac silhouette is top-normal in size. No mediastinal or hilar masses. There is no evidence of adenopathy. There are prominent bronchovascular markings bilaterally with mild interstitial thickening most evident in the lower lungs. No lung consolidation. Chronic bronchitic changes are noted in the posterior lower lobes. No pleural effusion or pneumothorax. Skeletal structures are intact.  IMPRESSION: No active cardiopulmonary disease. Electronically Signed   By: Lajean Manes M.D.   On: 12/01/2018 10:27    EKG: Independently reviewed. Sinus, nonspecific T wave changes   Time spent:60 minutes Code Status:   DNR Family Communication: Daughter and spouse at bedside Disposition Plan: expect 2-3 day hospitalization Consults called: none DVT Prophylaxis: [place] Lovenox  Orson Eva, DO  [facility] Pager [phone]  If 7PM-7AM, please contact night-coverage [email or web address] Password [facility] 12/01/2018, 11:16 AM

## 2018-12-01 NOTE — ED Provider Notes (Signed)
[facility] EMERGENCY DEPARTMENT Provider Note   CSN: [id number] Arrival date & time: 12/01/18  0857     History   Chief Complaint Chief Complaint  Patient presents with  . Cough    HPI Marie [name] is a 83 y.o. female with PMH significant for COPD on 2L O2 at night prn, HFpEF, HTN, parkinson's, HLD, DJD presented with cough and weakness.  Patient and her husband state that patient has been having a cough for the last 3 days that is occasionally productive of yellowish sputum. She also has progressive weakness with unsteady gait that her PCP has been sending her  to PT and per husband is in the process of getting some extra help at home. She was noted to have some wheezing yesterday. She does have O2 at home, 2L that she uses only as needed which she has not needed recently. She has not had any fever/chills, nasal congestion, chest pain, SOB. She seems to ambulate relatively well to the bathroom without difficulty breathing but does need assistance per husband. She has been complaint on her home trelegy.          Past Medical History:  Diagnosis Date  . Balance disorder 2008.     Falls a lot.  She can be standing and then leans too far over to one side   . Bulging disc   . Chronic bronchitis ([place])    due to congestion at times, on prednisone and advair  . COPD (chronic obstructive pulmonary disease) ([place])   . COPD with acute exacerbation ([place]) 12/15/2017  . Depression    many years ago  . GERD (gastroesophageal reflux disease)   . Hearing loss    mild  . Hyperlipidemia   . Hypertension   . Iatrogenic adrenal insufficiency ([place])   . Incontinence    not indicated at this visit.  Marland Kitchen LVH (left ventricular hypertrophy) due to hypertensive disease    a. 02/2017: echo showing an EF of 65-70% with moderate LVH, hyperdynamic LV with subvalvular gradient of 70 mmHg, SAM, and mild MR  . Neuropathy    legs stay numb   . Osteoarthritis    hands,   . Osteoporosis   .  Shortness of breath dyspnea   . Tubulovillous adenoma of rectum   . Wears dentures   . Wears glasses     Patient Active Problem List   Diagnosis Date Noted  . Asthmatic bronchitis 01/17/2018  . Chronic diastolic CHF (congestive heart failure) ([place]) 12/15/2017  . Anxiety 12/15/2017  . Subvalvular aortic stenosis determined by imaging 11/15/2017  . Pedal edema 11/15/2017  . Problem with medical care compliance 11/11/2017  . Aortic atherosclerosis ([place]) 10/19/2017  . Dysphagia 09/28/2017  . Chronic respiratory failure with hypoxia, on home O2 therapy ([place]) 09/20/2017  . Hypertensive heart disease 03/29/2017  . Protein-calorie malnutrition, severe 03/17/2017  . Parkinson's plus syndrome ([place]) 03/04/2017  . Restrictive lung disease 02/24/2017  . LVH (left ventricular hypertrophy) 01/07/2017  . Depression, major, in remission ([place]) 09/22/2016  . At high risk for falls 10/16/2015  . Prediabetes 08/23/2014  . Vitamin D deficiency 08/23/2014  . Medication management 08/23/2014  . Unstable Gait 01/30/2014  . Incontinence   . Hyperlipidemia   . GERD (gastroesophageal reflux disease)   . Hereditary and idiopathic peripheral neuropathy   . Essential hypertension   . Tubulovillous adenoma of rectum 09/04/2011    Past Surgical History:  Procedure Laterality Date  . ABDOMINAL HYSTERECTOMY    .  APPENDECTOMY    . CHONDROPLASTY  09/19/2014   Procedure: CHONDROPLASTY;  Surgeon: Alta Corning, MD;  Location: [place];  Service: Orthopedics;;  . DILATION AND CURETTAGE OF UTERUS    . EXTERNAL FIXATION LEG  10/25/2012   Procedure: EXTERNAL FIXATION LEG;  Surgeon: Rozanna Box, MD;  Location: [place];  Service: Orthopedics;  Laterality: Right;  . EYE SURGERY     bilateral cataract surgery and lens implant  . FINGER SURGERY     fusions and debridements for OA  . INCONTINENCE SURGERY     multiple procedures, not cured  . KNEE ARTHROSCOPY WITH LATERAL MENISECTOMY Right 09/19/2014    Procedure: KNEE ARTHROSCOPY WITH LATERAL MENISECTOMY;  Surgeon: Alta Corning, MD;  Location: [place];  Service: Orthopedics;  Laterality: Right;  . KNEE ARTHROSCOPY WITH MEDIAL MENISECTOMY Right 09/19/2014   Procedure: RIGHT KNEE ARTHROSCOPY WITH MEDIAL AND LATERAL MENISECTOMIES. CHONDROPLASTY OF PATELLA-FEMORAL JOINT;  Surgeon: Alta Corning, MD;  Location: [place];  Service: Orthopedics;  Laterality: Right;  . RECTAL BIOPSY  09/21/2011   Procedure: BIOPSY RECTAL;  Surgeon: Merrie Roof, MD;  Location: [place];  Service: General;  Laterality: N/A;  3-4 cm  . RECTAL SURGERY     by dr. Marlou Starks, removal of polyp  . TONSILLECTOMY       OB History   No obstetric history on file.      Home Medications    Prior to Admission medications   Medication Sig Start Date End Date Taking? Authorizing Provider  albuterol (PROVENTIL) (2.5 MG/3ML) 0.083% nebulizer solution Use 1 ampule in nebulizer 4 times a day or every 4 hours as needed to rescue asthma. Patient taking differently: Take 2.5 mg by nebulization See admin instructions. Use 1 ampule in nebulizer 4 times a day or every 4 hours as needed to rescue asthma. 04/04/18 04/04/19  Isaac Bliss, Rayford Halsted, MD  ALPRAZolam Duanne Moron) 0.5 MG tablet Take 1/2-1 tablet 2 - 3 x /day ONLY if needed for Anxiety Attack &  limit to 5 days /week to avoid addiction 11/20/18   [name], MD  aspirin 81 MG tablet Take 81 mg by mouth daily. Buys OTC    [provider]  carbidopa-levodopa (SINEMET IR) 25-100 MG tablet Take 2 tablet at 9am and 2 tablets at 3pm. Patient taking differently: Take 2 tablets by mouth 2 (two) times daily. Take 2 tablet at 9am and 2 tablets at 3pm. 03/04/18   Narda Amber K, DO  Cholecalciferol (VITAMIN D) 2000 units CAPS Take 2 capsules (4,000 Units total) by mouth daily. 08/16/18   Liane Comber, NP  ENSURE (ENSURE) Take 237 mLs by mouth daily as needed.     [provider]    furosemide (LASIX) 20 MG tablet Increase lasix for 40 mg (2 tabs) daily for 3-5 days until swelling resolves, then resume 20 mg (1 tab daily). 08/15/18   Liane Comber, NP  omeprazole (PRILOSEC) 40 MG capsule Take 1 capsule (40 mg total) by mouth daily. 09/19/18   Vicie Mutters, PA-C  OXYGEN Inhale 2 L into the lungs daily as needed (for shortness of breath). Setting is on 2 when needed     [provider]  potassium chloride SA (K-DUR,KLOR-CON) 20 MEQ tablet Take 1 tablet by mouth daily. 09/19/18   Vicie Mutters, PA-C  predniSONE (DELTASONE) 10 MG tablet Take 1 tablet (10 mg total) by mouth daily with breakfast. Take 1 tablet 3 x/day  or as directed 08/22/18   Liane Comber, NP  sertraline (ZOLOFT) 25 MG tablet Take 1 tablet (25 mg total) by mouth daily. 09/19/18   Vicie Mutters, PA-C  TRELEGY ELLIPTA 100-62.5-25 MCG/INH AEPB INHALE 1 PUFF ONCE DAILY 05/04/18   [provider]    Family History Family History  Problem Relation Age of Onset  . Cancer Sister        pt unaware of what kind  . High blood pressure Mother   . COPD Mother   . Heart attack Father     Social History Social History   Tobacco Use  . Smoking status: Never Smoker  . Smokeless tobacco: Never Used  Substance Use Topics  . Alcohol use: No  . Drug use: No     Allergies   Demerol  [meperidine hcl]; Ertapenem; Codeine; Demerol; Morphine and related; Other; and Sulfate   Review of Systems Review of Systems  Constitutional: Negative for chills and fever.  HENT: Negative for congestion, rhinorrhea and sore throat.   Respiratory: Positive for cough and wheezing.   Cardiovascular: Negative for chest pain and palpitations.  Gastrointestinal: Negative for abdominal pain, diarrhea, nausea and vomiting.  Genitourinary: Negative for dysuria, frequency and urgency.  Neurological: Negative for light-headedness.     Physical Exam Updated Vital Signs BP (!) 128/54 (BP Location: Right Arm)    Pulse 76   Temp 99.8 F (37.7 C) (Oral)   Resp (!) 24   Ht 5\' 4"  (1.626 m)   Wt 70.3 kg   SpO2 93%   BMI 26.61 kg/m   Physical Exam Constitutional:      General: She is not in acute distress.    Appearance: She is not ill-appearing, toxic-appearing or diaphoretic.  HENT:     Head: Normocephalic and atraumatic.     Nose: Nose normal. No congestion or rhinorrhea.     Mouth/Throat:     Mouth: Mucous membranes are dry.     Pharynx: Oropharynx is clear. No oropharyngeal exudate or posterior oropharyngeal erythema.  Cardiovascular:     Rate and Rhythm: Normal rate.     Heart sounds: Murmur (systolic grade III) present. No friction rub. No gallop.   Pulmonary:     Effort: Pulmonary effort is normal. No respiratory distress.     Breath sounds: No stridor. Wheezing present. No rhonchi.  Abdominal:     General: Abdomen is flat. Bowel sounds are normal. There is no distension.     Tenderness: There is no abdominal tenderness. There is no guarding.  Musculoskeletal:        General: No swelling.  Skin:    General: Skin is warm and dry.  Neurological:     Mental Status: She is alert and oriented to person, place, and time.  Psychiatric:        Mood and Affect: Mood normal.      ED Treatments / Results  Labs (all labs ordered are listed, but only abnormal results are displayed) Labs Reviewed  BASIC METABOLIC PANEL - Abnormal; Notable for the following components:      Result Value   Potassium 3.3 (*)    Glucose, Bld 105 (*)    BUN 27 (*)    Creatinine, Ser 1.46 (*)    Calcium 8.4 (*)    GFR calc non Af Amer 34 (*)    GFR calc Af Amer 39 (*)    All other components within normal limits  CBC WITH DIFFERENTIAL/PLATELET - Abnormal; Notable for the following  components:   RBC 5.30 (*)    HCT 46.3 (*)    MCH 25.7 (*)    MCHC 29.4 (*)    All other components within normal limits  BRAIN NATRIURETIC PEPTIDE - Abnormal; Notable for the following components:   B Natriuretic  Peptide 309.0 (*)    All other components within normal limits  CULTURE, BLOOD (ROUTINE X 2)  CULTURE, BLOOD (ROUTINE X 2)  EXPECTORATED SPUTUM ASSESSMENT W REFEX TO RESP CULTURE  INFLUENZA PANEL BY PCR (TYPE A & B)    EKG None  Radiology Dg Chest 2 View  Result Date: 12/01/2018 CLINICAL DATA:  Cough Pt brought in by EMS due to cough and weakness. Reports she has had a dry cough since Monday and has progressively gotten weak. Denies pain EXAM: CHEST - 2 VIEW COMPARISON:  04/03/2018 FINDINGS: Cardiac silhouette is top-normal in size. No mediastinal or hilar masses. There is no evidence of adenopathy. There are prominent bronchovascular markings bilaterally with mild interstitial thickening most evident in the lower lungs. No lung consolidation. Chronic bronchitic changes are noted in the posterior lower lobes. No pleural effusion or pneumothorax. Skeletal structures are intact. IMPRESSION: No active cardiopulmonary disease. Electronically Signed   By: Lajean Manes M.D.   On: 12/01/2018 10:27    Procedures Procedures (including critical care time)  Medications Ordered in ED Medications  methylPREDNISolone sodium succinate (SOLU-MEDROL) 125 mg/2 mL injection 125 mg (has no administration in time range)  ipratropium-albuterol (DUONEB) 0.5-2.5 (3) MG/3ML nebulizer solution 3 mL (3 mLs Nebulization Given 12/01/18 1026)     Initial Impression / Assessment and Plan / ED Course  I have reviewed the triage vital signs and the nursing notes.  Pertinent labs & imaging results that were available during my care of the patient were reviewed by me and considered in my medical decision making (see chart for details).    Patient has a worsening O2 requirement now on 2L and desatting to 88% with cough and increased sputum production c/w COPD production. She is febrile as well.  She is also weak and has limited resources at home and may benefit from placement. Also of note has an AKI with Cr up to 1.46  from baseline ~1 and appeared dry on exam. Will consult hospitalist who recommended checking flu swab and hold off antibiotics because hemodynamically stable and plan to admit.  Final Clinical Impressions(s) / ED Diagnoses   Final diagnoses:  Hypoxia  COPD exacerbation [facility])    ED Discharge Orders    None       Bufford Lope, DO 12/01/18 1055    Elnora Morrison, MD 12/01/18 [phone]

## 2018-12-01 NOTE — Progress Notes (Signed)
Pharmacy Antibiotic Note  ETHEL MEISENHEIMER is a 83 y.o. female admitted on 12/01/2018 with sepsis.  Pharmacy has been consulted for cefepime  dosing.    Blood and urine cultures are pending.  Influenza PCR is negative for both strains.  Plan: Start cefepime 2g IV q24h Monitor patient progress, cultures and other pertinent labs.  Height: 5\' 4"  (162.6 cm) Weight: 155 lb (70.3 kg) IBW/kg (Calculated) : 54.7  Temp (24hrs), Avg:99.9 F (37.7 C), Min:99.3 F (37.4 C), Max:100.6 F (38.1 C)  Recent Labs  Lab 12/01/18 0951  WBC 5.2  CREATININE 1.46*    Estimated Creatinine Clearance: 29.5 mL/min (A) (by C-G formula based on SCr of 1.46 mg/dL (H)).    Allergies  Allergen Reactions  . Demerol  [Meperidine Hcl] Other (See Comments)    Pt. Feels like she is suffocating  . Ertapenem Hives    Caused whole body to turn red Other reaction(s): Redness Caused whole body to turn red  . Codeine Other (See Comments)    hallucinations  . Demerol Other (See Comments)    hallucinations  . Morphine And Related Other (See Comments)    hallucinations  . Other Other (See Comments)    Invan 7- pt became red all over  . Sulfate Other (See Comments)    unknown  . Sulfa Antibiotics Rash    Antimicrobials this admission: cefepime  2/13 >>   Dose adjustments this admission: cefepime  Microbiology results: 2/13 [facility] x2   2/13 UCx:      Thank you for allowing pharmacy to be a part of this patient's care.  Despina Pole 12/01/2018 1:36 PM

## 2018-12-01 NOTE — ED Triage Notes (Signed)
Pt brought in by EMS due to cough and weakness. Reports she has had a dry cough since Monday and has progressively gotten weak. Denies pain

## 2018-12-02 LAB — MAGNESIUM: Magnesium: 2.2 mg/dL (ref 1.7–2.4)

## 2018-12-02 LAB — MRSA PCR SCREENING: MRSA by PCR: POSITIVE — AB

## 2018-12-02 LAB — BASIC METABOLIC PANEL
ANION GAP: 9 (ref 5–15)
BUN: 28 mg/dL — ABNORMAL HIGH (ref 8–23)
CO2: 22 mmol/L (ref 22–32)
Calcium: 8.1 mg/dL — ABNORMAL LOW (ref 8.9–10.3)
Chloride: 110 mmol/L (ref 98–111)
Creatinine, Ser: 1.09 mg/dL — ABNORMAL HIGH (ref 0.44–1.00)
GFR calc Af Amer: 56 mL/min — ABNORMAL LOW (ref >60)
GFR calc non Af Amer: 48 mL/min — ABNORMAL LOW (ref >60)
Glucose, Bld: 189 mg/dL — ABNORMAL HIGH (ref 70–99)
Potassium: 4.2 mmol/L (ref 3.5–5.1)
Sodium: 141 mmol/L (ref 135–145)

## 2018-12-02 LAB — GLUCOSE, CAPILLARY
Glucose-Capillary: 175 mg/dL — ABNORMAL HIGH (ref 70–99)
Glucose-Capillary: 117 mg/dL — ABNORMAL HIGH (ref 70–99)
Glucose-Capillary: 136 mg/dL — ABNORMAL HIGH (ref 70–99)
Glucose-Capillary: 156 mg/dL — ABNORMAL HIGH (ref 70–99)

## 2018-12-02 LAB — CBC
HCT: 42.8 % (ref 36.0–46.0)
Hemoglobin: 12.3 g/dL (ref 12.0–15.0)
MCH: 25.1 pg — ABNORMAL LOW (ref 26.0–34.0)
MCHC: 28.7 g/dL — ABNORMAL LOW (ref 30.0–36.0)
MCV: 87.2 fL (ref 80.0–100.0)
Platelets: 167 10*3/uL (ref 150–400)
RBC: 4.91 MIL/uL (ref 3.87–5.11)
RDW: 14.8 % (ref 11.5–15.5)
WBC: 2.5 10*3/uL — ABNORMAL LOW (ref 4.0–10.5)
nRBC: 0 % (ref 0.0–0.2)

## 2018-12-02 MED ORDER — ENSURE ENLIVE PO LIQD
237.0000 mL | Freq: Two times a day (BID) | ORAL | Status: DC
  Administered 2018-12-02 – 2018-12-05 (×6): 237 mL via ORAL

## 2018-12-02 MED ORDER — ENOXAPARIN SODIUM 40 MG/0.4ML ~~LOC~~ SOLN
40.0000 mg | SUBCUTANEOUS | Status: DC
  Administered 2018-12-02 – 2018-12-04 (×3): 40 mg via SUBCUTANEOUS
  Filled 2018-12-02 (×3): qty 0.4

## 2018-12-02 NOTE — Progress Notes (Signed)
Initial Nutrition Assessment  DOCUMENTATION CODES:  Not applicable  INTERVENTION:  Pt w/ severe tremors. Says she eats cold cereal, but sounds to have trouble. Question if pt would be better able to self feed with weighted utensils? Would consider OT consult to assess  Ensure Enlive po BID, each supplement provides 350 kcal and 20 grams of protein  Choc milk w/ L+ D  Noted food preferences-> will implement to improve intake.   NUTRITION DIAGNOSIS:  Increased nutrient needs related to acute illness, chronic illness(Acute URI + significant energy expenditure from tremors) as evidenced by estimated nutrition requirements for these coexisting conditions  GOAL:  Patient will meet greater than or equal to 90% of their needs  MONITOR:  PO intake, Weight trends, Labs, I & O's, Supplement acceptance  REASON FOR ASSESSMENT:  Consult Assessment of nutrition requirement/status  ASSESSMENT:  83 y/o female w/ PMHx COPD, progressive supranuclear palsy/parkisons plus syndrome, COPD, Anxiety/depression, GERD, Chronic resp failure (on 2L 02 at baseline). Has had worsening acute illness beginning 2/10. Symptoms started w/ cough/congestion and progressed to include to weakness and SOB. In ED, pt met SIRS criteria and found to have AKI. Admitted for management.   Pt demonstrates, severe generalized tremors. She says she lives at home with her spouse, who attends to all of her nutrition needs. She says she did not eat well this morning, because it was not what she is used to. RD took diet recall. She usually eats cold cereal (cheerios). Given her severe tremors, RD asked if she could manage this. She says "I do the best I can".  Her lunch and dinner are fully dependent on her spouse- she says she just eats whatever he provides her. She notes she drinks a lot of chocolate milk. She does drink oral supplements, 1-3 a day. She does take an MVI at home.  In regards to her acute illness, she says her symptoms  have NOT impacted her appetite. She says she is still eating the same amounts as she usually does.   Weight wise, she reports a UBW of 155 lbs. Her bed wt today is 151 lbs. Per chart, she has gained wt over the past year.  She had gotten down to 113 lbs last winter and was 120-130 lbs throughout the first half of 2019. She progressively gained weight throught the fall and appears to have been maintaining a weight between 150-155 lbs for the past several months.   She does not sound to use weighted utensils at baseline. Unsure if these would help her. RD took note of the food items she prefers and will place these into pt food system. She was agreeable to oral supplements and choc milk with meals. SHe denies any n/v/c/d.   Labs:  a1c on 1/29:6.4, BG since arrival: 105-190, BUN/Creat:27/1.46->28/1.09 Meds:D3, Sinemet, insulin, methylprednisolone, ppi, kcl  Recent Labs  Lab 12/01/18 0951 12/02/18 0458  NA 141 141  K 3.3* 4.2  CL 104 110  CO2 29 22  BUN 27* 28*  CREATININE 1.46* 1.09*  CALCIUM 8.4* 8.1*  MG  --  2.2  GLUCOSE 105* 189*   NUTRITION - FOCUSED PHYSICAL EXAM:   Most Recent Value  Orbital Region  No depletion  Upper Arm Region  No depletion  Thoracic and Lumbar Region  No depletion  Buccal Region  No depletion  Temple Region  No depletion  Clavicle Bone Region  No depletion  Clavicle and Acromion Bone Region  No depletion  Scapular Bone Region  Unable  to assess  Dorsal Hand  No depletion  Patellar Region  No depletion  Anterior Thigh Region  No depletion  Posterior Calf Region  No depletion  Edema (RD Assessment)  None  Hair  Reviewed  Eyes  Reviewed  Mouth  Reviewed  Skin  Reviewed  Nails  Reviewed     Diet Order:   Diet Order            Diet Carb Modified Fluid consistency: Thin; Room service appropriate? Yes  Diet effective now             EDUCATION NEEDS:  No education needs have been identified at this time  Skin: Abrasions to BLE  Last BM:   2/12  Height:  Ht Readings from Last 1 Encounters:  12/01/18 _0  (1.626 m)   Weight:  Wt Readings from Last 1 Encounters:  12/02/18 68.5 kg   Wt Readings from Last 10 Encounters:  12/02/18 68.5 kg  11/16/18 68.1 kg  08/22/18 69.9 kg  08/15/18 69.9 kg  08/12/18 73.7 kg  07/13/18 68.8 kg  06/10/18 64 kg  05/17/18 60.3 kg  05/12/18 60.4 kg  04/13/18 56.7 kg   Ideal Body Weight:  54.54 kg  BMI:  Body mass index is 25.92 kg/m.  Estimated Nutritional Needs:  Kcal:  1700-1900 kcals (25-28 kcal/kg bw) Protein:  82-96g Pro (1.2-1.4g/kg bw) Fluid:  >1.7 L fluid (25 ml/kg bw)  Burtis Junes RD, LDN, CNSC Clinical Nutrition Available Tues-Sat via Pager: [phone] 12/02/2018 12:19 PM

## 2018-12-02 NOTE — Care Management Note (Signed)
Case Management Note  Patient Details  Name: Marie [name] MRN: [id number] Date of Birth: 1936/03/14  Subjective/Objective:                    Action/Plan: Patient recommended for SNF. CSW discussed with patient and husband. They decline.  Elects home health with [facility].  Has RW and BSC at home. Has home oxygen.  Brad of [facility] notified and will obtain orders via Epic.   Patient is active with [facility] services.    Expected Discharge Date:    12/03/18              Expected Discharge Plan:  [place]  In-House Referral:     Discharge planning Services  CM Consult  Post Acute Care Choice:  Home Health Choice offered to:  Patient, Spouse  DME Arranged:    DME Agency:     HH Arranged:  PT [place]:  [place]  Status of Service:  Completed, signed off  If discussed at [place] of Stay Meetings, dates discussed:    Additional Comments:  Avish Torry, Chauncey Reading, RN 12/02/2018, 1:14 PM

## 2018-12-02 NOTE — Clinical Social Work Note (Signed)
Patient and husband declines SNF. The are accepting of HHPT. CM aware and will arrange.    Adelae Yodice, Clydene Pugh, LCSW

## 2018-12-02 NOTE — Plan of Care (Signed)
  Problem: Acute Rehab PT Goals(only PT should resolve) Goal: Pt Will Go Supine/Side To Sit Outcome: Progressing Flowsheets (Taken 12/02/2018 1220) Pt will go Supine/Side to Sit: with modified independence Goal: Patient Will Transfer Sit To/From Stand Outcome: Progressing Flowsheets (Taken 12/02/2018 1220) Patient will transfer sit to/from stand: with minimal assist; with moderate assist Goal: Pt Will Transfer Bed To Chair/Chair To Bed Outcome: Progressing Flowsheets (Taken 12/02/2018 1220) Pt will Transfer Bed to Chair/Chair to Bed: with min assist; with mod assist Goal: Pt Will Ambulate Outcome: Progressing Flowsheets (Taken 12/02/2018 1220) Pt will Ambulate: 25 feet; with minimal assist; with moderate assist; with rolling walker   12:21 PM, 12/02/18 Lonell Grandchild, MPT Physical Therapist with [facility] 336 [phone] office [phone] mobile phone

## 2018-12-02 NOTE — Progress Notes (Signed)
PROGRESS NOTE  Marie [name] NLZ:[id number] DOB: 06-10-1936 DOA: 12/01/2018 PCP: [name], MD  Brief History:   83 y.o. female with medical history of progressive supranuclear palsy/Parkinson's plus syndrome, COPD, peripheral neuropathy,, anxiety, GERD, chronic respiratory failure on as needed 2 L at home, prediabetes, and depression presenting with shortness of breath, coughing, congestion, generalized weakness that has progressively worsened since 11/28/2018.  Her symptoms began with coughing and congestion on 11/28/2018.  Symptoms continue to worsen with worsening shortness of breath and generalized weakness to the point where she was not able to get out of bed.  At baseline, the patient uses a walker to ambulate.  The patient has been using her inhalers without much improvement.  Because of worsening shortness of breath and wheezing, the patient's family brought the patient to the emergency department for further evaluation.    Assessment/Plan:  Acute on chronic respiratory failure with hypoxia -Secondary COPD exacerbation which is likely incited from respiratory infection -Chronically on as needed oxygen nasal cannula -Presently stable on 3 L -Wean oxygen as tolerated back to room air -still wheezing and having dyspnea with minimal exertion  COPD exacerbation -continue Pulmicort -Continue duo nebs -Continue Solu-Medrol -Influenza PCR--neg -viral respiratory panel  Fever/SIRS -Blood cultures x2 sets -Influenza PCR--neg -continue empiric cefepime pending culture data -check lactate--1.5  Acute kidney injury -Baseline creatinine 0.8-1.0 -Presented with serum creatinine of 1.46 -Secondary to volume depletion -IV fluids-->improved -A.m. BMP -Hold furosemide  Impaired glucose tolerance -11/16/2018 hemoglobin A1c 6.4 -Start NovoLog sliding scale as IV steroids will likely increase CBGs  Hypokalemia -Repleted -Check  magnesium--2.2  Anxiety/depression -Continue home dose alprazolam and Zoloft  Parkinson's plus syndrome/PSP -Continue home dose of Sinemet  Severe malnutrition -consult nutrition  Stage 2 sacral wound -present on admission -continue local wound care     Disposition Plan:   SNF 2/17 if stable Family Communication:   Spouse updated at bedside  Consultants:  none  Code Status:   DNR  DVT Prophylaxis:  [place] Lovenox   Procedures: As Listed in Progress Note Above  Antibiotics: None       Subjective: Pt feels breathing is only a little better.  She denies f/c, cp, n/v/d, abd pain.  She has nonproductive cough, not much better.  No diarrhea  Objective: Vitals:   12/01/18 2215 12/02/18 0646 12/02/18 0733 12/02/18 1216  BP: 123/65     Pulse: 69     Resp: 20     Temp: (!) 97.4 F (36.3 C)     TempSrc: Axillary     SpO2: 95% 96% 98%   Weight:    68.5 kg  Height:        Intake/Output Summary (Last 24 hours) at 12/02/2018 1248 Last data filed at 12/02/2018 0900 Gross per 24 hour  Intake 1414.22 ml  Output 200 ml  Net 1214.22 ml   Weight change:  Exam:   General:  Pt is alert, follows commands appropriately, not in acute distress  HEENT: No icterus, No thrush, No neck mass, [place]/AT  Cardiovascular: RRR, S1/S2, no rubs, no gallops  Respiratory: bilateral wheeze.  Bibasilar rales  Abdomen: Soft/+BS, non tender, non distended, no guarding  Extremities: No edema, No lymphangitis, No petechiae, No rashes, no synovitis   Data Reviewed: I have personally reviewed following labs and imaging studies Basic Metabolic Panel: Recent Labs  Lab 12/01/18 0951 12/02/18 0458  NA 141 141  K 3.3* 4.2  CL 104 110  CO2 29 22  GLUCOSE 105* 189*  BUN 27* 28*  CREATININE 1.46* 1.09*  CALCIUM 8.4* 8.1*  MG  --  2.2   Liver Function Tests: No results for input(s): AST, ALT, ALKPHOS, BILITOT, PROT, ALBUMIN in the last 168 hours. No results for input(s): LIPASE,  AMYLASE in the last 168 hours. No results for input(s): AMMONIA in the last 168 hours. Coagulation Profile: No results for input(s): INR, PROTIME in the last 168 hours. CBC: Recent Labs  Lab 12/01/18 0951 12/02/18 0458  WBC 5.2 2.5*  NEUTROABS 3.3  --   HGB 13.6 12.3  HCT 46.3* 42.8  MCV 87.4 87.2  PLT 183 167   Cardiac Enzymes: No results for input(s): CKTOTAL, CKMB, CKMBINDEX, TROPONINI in the last 168 hours. BNP: Invalid input(s): POCBNP CBG: Recent Labs  Lab 12/01/18 1636 12/01/18 2212 12/02/18 0800 12/02/18 1132  GLUCAP 160* 146* 175* 117*   HbA1C: No results for input(s): HGBA1C in the last 72 hours. Urine analysis:    Component Value Date/Time   COLORURINE YELLOW 05/16/2018 [place] 05/16/2018 1453   LABSPEC 1.014 05/16/2018 1453   PHURINE 6.0 05/16/2018 1453   GLUCOSEU NEGATIVE 05/16/2018 1453   HGBUR NEGATIVE 05/16/2018 1453   BILIRUBINUR NEGATIVE 01/22/2018 2300   KETONESUR NEGATIVE 05/16/2018 1453   PROTEINUR NEGATIVE 05/16/2018 1453   UROBILINOGEN 0.2 04/16/2015 2310   NITRITE NEGATIVE 05/16/2018 1453   LEUKOCYTESUR NEGATIVE 05/16/2018 1453   Sepsis Labs: @LABRCNTIP (procalcitonin:4,lacticidven:4) ) Recent Results (from the past 240 hour(s))  Blood culture (routine x 2)     Status: None (Preliminary result)   Collection Time: 12/01/18  1:14 PM  Result Value Ref Range Status   Specimen Description BLOOD RIGHT FOREARM  Final   Special Requests   Final    BOTTLES DRAWN AEROBIC AND ANAEROBIC Blood Culture adequate volume   Culture   Final    NO GROWTH < 24 HOURS Performed at [facility], [address] [place], [place] [address]    Report Status PENDING  Incomplete  Blood culture (routine x 2)     Status: None (Preliminary result)   Collection Time: 12/01/18  1:14 PM  Result Value Ref Range Status   Specimen Description BLOOD LEFT HAND  Final   Special Requests   Final    BOTTLES DRAWN AEROBIC ONLY Blood Culture results may  not be optimal due to an inadequate volume of blood received in culture bottles   Culture   Final    NO GROWTH < 24 HOURS Performed at [facility], [address] [place], [place] [address]    Report Status PENDING  Incomplete     Scheduled Meds: . aspirin EC  81 mg Oral Daily  . budesonide (PULMICORT) nebulizer solution  0.5 mg Nebulization BID  . carbidopa-levodopa  2 tablet Oral BID  . cholecalciferol  4,000 Units Oral Daily  . enoxaparin (LOVENOX) injection  40 mg Subcutaneous Q24H  . feeding supplement (ENSURE ENLIVE)  237 mL Oral BID BM  . insulin aspart  0-9 Units Subcutaneous TID WC  . ipratropium-albuterol  3 mL Nebulization Q6H  . methylPREDNISolone (SOLU-MEDROL) injection  60 mg Intravenous Q6H  . pantoprazole  80 mg Oral Daily  . potassium chloride SA  20 mEq Oral Daily  . sertraline  25 mg Oral Daily   Continuous Infusions: . 0.9 % NaCl with KCl 20 mEq / L 75 mL/hr at 12/02/18 0432  . ceFEPime (MAXIPIME) IV 2 g (12/02/18 1235)  Procedures/Studies: Dg Chest 2 View  Result Date: 12/01/2018 CLINICAL DATA:  Cough Pt brought in by EMS due to cough and weakness. Reports she has had a dry cough since Monday and has progressively gotten weak. Denies pain EXAM: CHEST - 2 VIEW COMPARISON:  04/03/2018 FINDINGS: Cardiac silhouette is top-normal in size. No mediastinal or hilar masses. There is no evidence of adenopathy. There are prominent bronchovascular markings bilaterally with mild interstitial thickening most evident in the lower lungs. No lung consolidation. Chronic bronchitic changes are noted in the posterior lower lobes. No pleural effusion or pneumothorax. Skeletal structures are intact. IMPRESSION: No active cardiopulmonary disease. Electronically Signed   By: Lajean Manes M.D.   On: 12/01/2018 10:27    Orson Eva, DO  [facility] Pager [phone]  If 7PM-7AM, please contact night-coverage [email or web address] Password [email or web address] 12/02/2018, 12:48 PM   LOS: 1 day

## 2018-12-02 NOTE — NC FL2 (Signed)
MEDICAID FL2 LEVEL OF CARE SCREENING TOOL     IDENTIFICATION  Patient Name: Marie [name] Birthdate: 12-Nov-1935 Sex: female Admission Date (Current Location): 12/01/2018  [facility] and [place] Number:  [employer] and Address:  [place] [address], [place]      Provider Number: [id number]  Attending Physician Name and Address:  Orson Eva, MD  Relative Name and Phone Number:       Current Level of Care: Hospital Recommended Level of Care: [place] Prior Approval Number:    Date Approved/Denied:   PASRR Number: [id number]  Discharge Plan: SNF    Current Diagnoses: Patient Active Problem List   Diagnosis Date Noted  . Acute on chronic respiratory failure with hypoxia ([place]) 12/01/2018  . COPD with acute exacerbation ([place]) 12/01/2018  . AKI (acute kidney injury) ([place]) 12/01/2018  . COPD exacerbation ([place])   . Hypoxia   . Asthmatic bronchitis 01/17/2018  . Chronic diastolic CHF (congestive heart failure) ([place]) 12/15/2017  . Anxiety 12/15/2017  . Subvalvular aortic stenosis determined by imaging 11/15/2017  . Pedal edema 11/15/2017  . Problem with medical care compliance 11/11/2017  . Aortic atherosclerosis ([place]) 10/19/2017  . Dysphagia 09/28/2017  . Chronic respiratory failure with hypoxia, on home O2 therapy ([place]) 09/20/2017  . Hypertensive heart disease 03/29/2017  . Protein-calorie malnutrition, severe 03/17/2017  . Parkinson's plus syndrome ([place]) 03/04/2017  . Restrictive lung disease 02/24/2017  . LVH (left ventricular hypertrophy) 01/07/2017  . Depression, major, in remission ([place]) 09/22/2016  . At high risk for falls 10/16/2015  . Prediabetes 08/23/2014  . Vitamin D deficiency 08/23/2014  . Medication management 08/23/2014  . Unstable Gait 01/30/2014  . Incontinence   . Hyperlipidemia   . GERD (gastroesophageal reflux disease)   . Hereditary and idiopathic peripheral neuropathy   .  Essential hypertension   . Tubulovillous adenoma of rectum 09/04/2011    Orientation RESPIRATION BLADDER Height & Weight     Self, Time, Place  O2(3L) Incontinent Weight: 145 lb 1 oz (65.8 kg) Height:  5\' 4"  (162.6 cm)  BEHAVIORAL SYMPTOMS/MOOD NEUROLOGICAL BOWEL NUTRITION STATUS      Incontinent ([place] modified)  AMBULATORY STATUS COMMUNICATION OF NEEDS Skin   Limited Assist Verbally Normal                       Personal Care Assistance Level of Assistance  Bathing, Feeding, Dressing Bathing Assistance: Limited assistance Feeding assistance: Independent Dressing Assistance: Limited assistance     Functional Limitations Info  Sight, Hearing, Speech Sight Info: Adequate Hearing Info: Adequate Speech Info: Adequate    SPECIAL CARE FACTORS FREQUENCY  PT (By licensed PT)     PT Frequency: 5x/week              Contractures Contractures Info: Not present    Additional Factors Info  Code Status, Allergies, Isolation Precautions Code Status Info: DNR Allergies Info: Demerol, Ertapenem, Codeine, Demerol, Morphine and Related, other, sulfate, sulfa antibiotics     Isolation Precautions Info: Droplet precautions     Current Medications (12/02/2018):  This is the current hospital active medication list Current Facility-Administered Medications  Medication Dose Route Frequency Provider Last Rate Last Dose  . 0.9 % NaCl with KCl 20 mEq/ L  infusion   Intravenous Continuous Tat, Shanon Brow, MD 75 mL/hr at 12/02/18 0432    . acetaminophen (TYLENOL) tablet 650 mg  650 mg Oral Q6H PRN Orson Eva, MD  Or  . acetaminophen (TYLENOL) suppository 650 mg  650 mg Rectal Q6H PRN Tat, Shanon Brow, MD      . ALPRAZolam Duanne Moron) tablet 0.25 mg  0.25 mg Oral TID PRN Tat, Shanon Brow, MD      . aspirin EC tablet 81 mg  81 mg Oral Daily Tat, David, MD   81 mg at 12/02/18 1100  . budesonide (PULMICORT) nebulizer solution 0.5 mg  0.5 mg Nebulization BID Tat, David, MD   0.5 mg at 12/02/18 0733  .  carbidopa-levodopa (SINEMET IR) 25-100 MG per tablet immediate release 2 tablet  2 tablet Oral BID Orson Eva, MD   2 tablet at 12/02/18 1100  . ceFEPIme (MAXIPIME) 2 g in sodium chloride 0.9 % 100 mL IVPB  2 g Intravenous Q24H Tat, David, MD      . cholecalciferol (VITAMIN D3) tablet 4,000 Units  4,000 Units Oral Daily Tat, David, MD   4,000 Units at 12/02/18 1059  . enoxaparin (LOVENOX) injection 40 mg  40 mg Subcutaneous Q24H Tat, David, MD      . insulin aspart (novoLOG) injection 0-9 Units  0-9 Units Subcutaneous TID WC Orson Eva, MD   2 Units at 12/02/18 0844  . ipratropium-albuterol (DUONEB) 0.5-2.5 (3) MG/3ML nebulizer solution 3 mL  3 mL Nebulization Q6H Orson Eva, MD   3 mL at 12/02/18 0645  . methylPREDNISolone sodium succinate (SOLU-MEDROL) 125 mg/2 mL injection 60 mg  60 mg Intravenous Q6H Orson Eva, MD   60 mg at 12/02/18 1100  . ondansetron (ZOFRAN) tablet 4 mg  4 mg Oral Q6H PRN Tat, David, MD       Or  . ondansetron (ZOFRAN) injection 4 mg  4 mg Intravenous Q6H PRN Tat, David, MD      . pantoprazole (PROTONIX) EC tablet 80 mg  80 mg Oral Daily Tat, David, MD   80 mg at 12/02/18 1100  . potassium chloride SA (K-DUR,KLOR-CON) CR tablet 20 mEq  20 mEq Oral Daily Tat, David, MD   20 mEq at 12/02/18 1100  . sertraline (ZOLOFT) tablet 25 mg  25 mg Oral Daily Tat, David, MD   25 mg at 12/02/18 1100     Discharge Medications: Please see discharge summary for a list of discharge medications.  Relevant Imaging Results:  Relevant Lab Results:   Additional Information SSN: [id number]  Marie [name], Marie Pugh, LCSW

## 2018-12-02 NOTE — Evaluation (Addendum)
Physical Therapy Evaluation Patient Details Name: Marie [name] MRN: [id number] DOB: January 21, 1936 Today's Date: 12/02/2018   History of Present Illness  Marie [name] is a 83 y.o. female with medical history of progressive supranuclear palsy/Parkinson's plus syndrome, COPD, peripheral neuropathy,, anxiety, GERD, chronic respiratory failure on as needed 2 L at home, prediabetes, and depression presenting with shortness of breath, coughing, congestion, generalized weakness that has progressively worsened since 11/28/2018.  Her symptoms began with coughing and congestion on 11/28/2018.  Symptoms continue to worsen with worsening shortness of breath and generalized weakness to the point where she was not able to get out of bed.  At baseline, the patient uses a walker to ambulate.  The patient has been using her inhalers without much improvement.  Because of worsening shortness of breath and wheezing, the patient's family brought the patient to the emergency department for further evaluation.  The patient denies any fevers, chills, chest pain, headache, sore throat, neck pain, nausea, vomiting, diarrhea, abdominal pain, dysuria, hematuria.  She does complain of cough and shortness of breath.  There is no hemoptysis.    Clinical Impression  Patient severely at risk for falls due to uncontrollable tremors when attempting functional activities/tasks, limited to a few unsteady slow side steps at bedside with frequent near falls, able to sit on BSC to have a bowel movement before transferring to chair, unable to ambulate away from bedside due to poor standing balance/tremors.  Patient had 7-8 minute long episode of severe coughing upon sitting up at bedside that resolved.   Patient will benefit from continued physical therapy in hospital and recommended venue below to increase strength, balance, endurance for safe ADLs and gait.    Follow Up Recommendations SNF    Equipment Recommendations  None recommended by  PT    Recommendations for Other Services       Precautions / Restrictions Precautions Precautions: Fall Restrictions Weight Bearing Restrictions: No      Mobility  Bed Mobility Overal bed mobility: Needs Assistance Bed Mobility: Supine to Sit     Supine to sit: Min guard     General bed mobility comments: increased time, slow labored movement  Transfers Overall transfer level: Needs assistance Equipment used: Rolling walker (2 wheeled) Transfers: Sit to/from [facility] Sit to Stand: Mod assist;Max assist Stand pivot transfers: Mod assist;Max assist       General transfer comment: very unsteady and shaky all over body  Ambulation/Gait Ambulation/Gait assistance: Max assist Gait Distance (Feet): 3 Feet Assistive device: Rolling walker (2 wheeled) Gait Pattern/deviations: Decreased step length - right;Decreased step length - left;Decreased stride length Gait velocity: slow   General Gait Details: limited to 3-4 unsteady shaking side steps, uncontrollable tremors  Stairs            Wheelchair Mobility    Modified Rankin (Stroke Patients Only)       Balance Overall balance assessment: Needs assistance Sitting-balance support: Feet supported;No upper extremity supported Sitting balance-Leahy Scale: Fair     Standing balance support: Bilateral upper extremity supported;During functional activity Standing balance-Leahy Scale: Poor Standing balance comment: using RW                             Pertinent Vitals/Pain Pain Assessment: No/denies pain    Home Living Family/patient expects to be discharged to:: Private residence Living Arrangements: Spouse/significant other;Children Available Help at Discharge: Family;Available 24 hours/day Type of Home: House Home Access: Ramped entrance  Home Layout: Two level;Able to live on main level with bedroom/bathroom Home Equipment: Walker - 4 wheels;Cane - single point;Shower  seat;Grab bars - toilet;Grab bars - tub/shower;Wheelchair - Scientist, product/process development - 2 wheels      Prior Function Level of Independence: Independent with assistive device(s)         Comments: household ambulator with Rollator/RW, uses w/c for longer distances     Hand Dominance   Dominant Hand: Right    Extremity/Trunk Assessment   Upper Extremity Assessment Upper Extremity Assessment: Generalized weakness    Lower Extremity Assessment Lower Extremity Assessment: Generalized weakness    Cervical / Trunk Assessment Cervical / Trunk Assessment: Kyphotic  Communication   Communication: No difficulties  Cognition Arousal/Alertness: Awake/alert Behavior During Therapy: WFL for tasks assessed/performed Overall Cognitive Status: Within Functional Limits for tasks assessed                                        General Comments      Exercises     Assessment/Plan    PT Assessment Patient needs continued PT services  PT Problem List Decreased strength;Decreased activity tolerance;Decreased balance;Decreased mobility       PT Treatment Interventions Gait training;Stair training;Functional mobility training;Therapeutic activities;Patient/family education;Therapeutic exercise    PT Goals (Current goals can be found in the Care Plan section)  Acute Rehab PT Goals Patient Stated Goal: return home after rehab PT Goal Formulation: With patient Time For Goal Achievement: 12/16/18 Potential to Achieve Goals: Good    Frequency Min 3X/week   Barriers to discharge        Co-evaluation               AM-PAC PT "6 Clicks" Mobility  Outcome Measure Help needed turning from your back to your side while in a flat bed without using bedrails?: A Little Help needed moving from lying on your back to sitting on the side of a flat bed without using bedrails?: A Little Help needed moving to and from a bed to a chair (including a wheelchair)?: A Lot Help  needed standing up from a chair using your arms (e.g., wheelchair or bedside chair)?: A Lot Help needed to walk in hospital room?: Total Help needed climbing 3-5 steps with a railing? : Total 6 Click Score: 12    End of Session Equipment Utilized During Treatment: Oxygen Activity Tolerance: Patient tolerated treatment well;Patient limited by fatigue Patient left: in chair;with call bell/phone within reach;with chair alarm set Nurse Communication: Mobility status PT Visit Diagnosis: Unsteadiness on feet (R26.81);Other abnormalities of gait and mobility (R26.89);Muscle weakness (generalized) (M62.81)    Time: 9476-5465 PT Time Calculation (min) (ACUTE ONLY): 37 min   Charges:   PT Evaluation $PT Eval High Complexity: 1 High PT Treatments $Therapeutic Activity: 23-37 mins        12:18 PM, 12/02/18 Lonell Grandchild, MPT Physical Therapist with [facility] [phone] office [phone] mobile phone

## 2018-12-03 LAB — BASIC METABOLIC PANEL
Anion gap: 7 (ref 5–15)
BUN: 31 mg/dL — ABNORMAL HIGH (ref 8–23)
CO2: 23 mmol/L (ref 22–32)
Calcium: 8.2 mg/dL — ABNORMAL LOW (ref 8.9–10.3)
Chloride: 110 mmol/L (ref 98–111)
Creatinine, Ser: 0.99 mg/dL (ref 0.44–1.00)
GFR calc Af Amer: >60 mL/min (ref >60)
GFR, EST NON AFRICAN AMERICAN: 54 mL/min — AB (ref >60)
GLUCOSE: 146 mg/dL — AB (ref 70–99)
Potassium: 4.3 mmol/L (ref 3.5–5.1)
Sodium: 140 mmol/L (ref 135–145)

## 2018-12-03 LAB — CBC
HCT: 39.4 % (ref 36.0–46.0)
Hemoglobin: 11.7 g/dL — ABNORMAL LOW (ref 12.0–15.0)
MCH: 26 pg (ref 26.0–34.0)
MCHC: 29.7 g/dL — ABNORMAL LOW (ref 30.0–36.0)
MCV: 87.6 fL (ref 80.0–100.0)
Platelets: 174 10*3/uL (ref 150–400)
RBC: 4.5 MIL/uL (ref 3.87–5.11)
RDW: 14.4 % (ref 11.5–15.5)
WBC: 7.8 10*3/uL (ref 4.0–10.5)
nRBC: 0 % (ref 0.0–0.2)

## 2018-12-03 LAB — GLUCOSE, CAPILLARY
Glucose-Capillary: 134 mg/dL — ABNORMAL HIGH (ref 70–99)
Glucose-Capillary: 166 mg/dL — ABNORMAL HIGH (ref 70–99)
Glucose-Capillary: 183 mg/dL — ABNORMAL HIGH (ref 70–99)
Glucose-Capillary: 139 mg/dL — ABNORMAL HIGH (ref 70–99)

## 2018-12-03 MED ORDER — HALOPERIDOL LACTATE 5 MG/ML IJ SOLN
2.0000 mg | Freq: Once | INTRAMUSCULAR | Status: AC
  Administered 2018-12-03: 2 mg via INTRAMUSCULAR
  Filled 2018-12-03: qty 1

## 2018-12-03 MED ORDER — FUROSEMIDE 20 MG PO TABS
20.0000 mg | ORAL_TABLET | Freq: Every day | ORAL | Status: DC
  Administered 2018-12-03 – 2018-12-05 (×3): 20 mg via ORAL
  Filled 2018-12-03 (×3): qty 1

## 2018-12-03 NOTE — Progress Notes (Signed)
PROGRESS NOTE  Marie [name] YQM:[id number] DOB: October 08, 1936 DOA: 12/01/2018 PCP: [name], MD  Brief History:  83 y.o.femalewith medical history ofprogressive supranuclear palsy/Parkinson's plus syndrome, COPD, peripheral neuropathy,, anxiety, GERD, chronic respiratory failure on as needed 2 L at home, prediabetes, and depression presenting with shortness of breath, coughing, congestion, generalized weakness that has progressively worsened since 11/28/2018. Her symptoms began with coughing and congestion on 11/28/2018. Symptoms continue to worsen with worsening shortness of breath and generalized weakness to the point where she was not able to get out of bed. At baseline, the patient uses a walker to ambulate. The patient has been using her inhalers without much improvement. Because of worsening shortness of breath and wheezing, the patient's family brought the patient to the emergency department for further evaluation.   Assessment/Plan:  Acute on chronic respiratory failure with hypoxia -Secondary COPD exacerbation which is likely incited from respiratory infection -Chronically on as needed oxygen nasal cannula -Presently stable on 2 L -Wean oxygen as tolerated back to room air -still wheezing and having dyspnea with mild exertion  COPD exacerbation -continue Pulmicort -Continue duo nebs -Continue Solu-Medrol -Influenza PCR--neg  Fever/SIRS -Blood cultures x2 sets -Influenza PCR--neg -discontinue empiric cefepime -check lactate--1.5  Acute kidney injury -Baseline creatinine 0.8-1.0 -Presented with serum creatinine of 1.46 -Secondary to volume depletion -IV fluids-->improved -A.m. BMP -Hold furosemide  Impaired glucose tolerance -11/16/2018 hemoglobin A1c 6.4 -Start NovoLog sliding scale as IV steroids will likely increase CBGs -CBGs controlled  Hypokalemia -Repleted -Check magnesium--2.2  Anxiety/depression -Continue home dose  alprazolam and Zoloft  Parkinson's plus syndrome/PSP -Continue home dose of Sinemet  Stage 2 sacral wound -present on admission -continue local wound care     Disposition Plan:    Home vs SNF 2/17 if stable Family Communication:   Spouse updated at bedside 12/03/18  Consultants:  none  Code Status:   DNR  DVT Prophylaxis:  [place] Lovenox   Procedures: As Listed in Progress Note Above  Antibiotics: None     Subjective: Pt states she is gradually improving, but still has some dyspnea on exertion.  Denies f/c, cp, n/v/d, abd pain  Objective: Vitals:   12/03/18 0726 12/03/18 0744 12/03/18 1047 12/03/18 1316  BP:   133/77 (!) 143/72  Pulse:   79 81  Resp:   (!) 21 (!) 21  Temp:   (!) 97.3 F (36.3 C) 98.3 F (36.8 C)  TempSrc:   Oral Oral  SpO2: 97% 97% 97% 95%  Weight:      Height:        Intake/Output Summary (Last 24 hours) at 12/03/2018 1353 Last data filed at 12/03/2018 1100 Gross per 24 hour  Intake 240 ml  Output 520 ml  Net -280 ml   Weight change: -1.814 kg Exam:   General:  Pt is alert, follows commands appropriately, not in acute distress  HEENT: No icterus, No thrush, No neck mass, [place]/AT  Cardiovascular: RRR, S1/S2, no rubs, no gallops  Respiratory: bibasilar rales. Mild basilar wheeze  Abdomen: Soft/+BS, non tender, non distended, no guarding  Extremities: No edema, No lymphangitis, No petechiae, No rashes, no synovitis   Data Reviewed: I have personally reviewed following labs and imaging studies Basic Metabolic Panel: Recent Labs  Lab 12/01/18 0951 12/02/18 0458 12/03/18 0703  NA 141 141 140  K 3.3* 4.2 4.3  CL 104 110 110  CO2 29 22 23   GLUCOSE 105* 189* 146*  BUN 27* 28*  31*  CREATININE 1.46* 1.09* 0.99  CALCIUM 8.4* 8.1* 8.2*  MG  --  2.2  --    Liver Function Tests: No results for input(s): AST, ALT, ALKPHOS, BILITOT, PROT, ALBUMIN in the last 168 hours. No results for input(s): LIPASE, AMYLASE in the last  168 hours. No results for input(s): AMMONIA in the last 168 hours. Coagulation Profile: No results for input(s): INR, PROTIME in the last 168 hours. CBC: Recent Labs  Lab 12/01/18 0951 12/02/18 0458 12/03/18 0703  WBC 5.2 2.5* 7.8  NEUTROABS 3.3  --   --   HGB 13.6 12.3 11.7*  HCT 46.3* 42.8 39.4  MCV 87.4 87.2 87.6  PLT 183 167 174   Cardiac Enzymes: No results for input(s): CKTOTAL, CKMB, CKMBINDEX, TROPONINI in the last 168 hours. BNP: Invalid input(s): POCBNP CBG: Recent Labs  Lab 12/02/18 1132 12/02/18 1657 12/02/18 2133 12/03/18 0800 12/03/18 1131  GLUCAP 117* 136* 156* 134* 166*   HbA1C: No results for input(s): HGBA1C in the last 72 hours. Urine analysis:    Component Value Date/Time   COLORURINE YELLOW 05/16/2018 [place] 05/16/2018 1453   LABSPEC 1.014 05/16/2018 1453   PHURINE 6.0 05/16/2018 1453   GLUCOSEU NEGATIVE 05/16/2018 1453   HGBUR NEGATIVE 05/16/2018 1453   BILIRUBINUR NEGATIVE 01/22/2018 2300   KETONESUR NEGATIVE 05/16/2018 1453   PROTEINUR NEGATIVE 05/16/2018 1453   UROBILINOGEN 0.2 04/16/2015 2310   NITRITE NEGATIVE 05/16/2018 1453   LEUKOCYTESUR NEGATIVE 05/16/2018 1453   Sepsis Labs: @LABRCNTIP (procalcitonin:4,lacticidven:4) ) Recent Results (from the past 240 hour(s))  Blood culture (routine x 2)     Status: None (Preliminary result)   Collection Time: 12/01/18  1:14 PM  Result Value Ref Range Status   Specimen Description BLOOD RIGHT FOREARM  Final   Special Requests   Final    BOTTLES DRAWN AEROBIC AND ANAEROBIC Blood Culture adequate volume   Culture   Final    NO GROWTH 2 DAYS Performed at [facility], [address], [place], [place] [address]    Report Status PENDING  Incomplete  Blood culture (routine x 2)     Status: None (Preliminary result)   Collection Time: 12/01/18  1:14 PM  Result Value Ref Range Status   Specimen Description BLOOD LEFT HAND  Final   Special Requests   Final    BOTTLES DRAWN  AEROBIC ONLY Blood Culture results may not be optimal due to an inadequate volume of blood received in culture bottles   Culture   Final    NO GROWTH 2 DAYS Performed at [facility], [address], [place], [place] [address]    Report Status PENDING  Incomplete  MRSA PCR Screening     Status: Abnormal   Collection Time: 12/02/18  2:51 PM  Result Value Ref Range Status   MRSA by PCR POSITIVE (A) NEGATIVE Final    Comment:        The GeneXpert MRSA Assay (FDA approved for NASAL specimens only), is one component of a comprehensive MRSA colonization surveillance program. It is not intended to diagnose MRSA infection nor to guide or monitor treatment for MRSA infections. RESULT CALLED TO, READ BACK BY AND VERIFIED WITH: ALSTON,C @ 1940 ON 12/02/18 BY JUW Performed at [facility], [address] [place], [place] [address]      Scheduled Meds: . aspirin EC  81 mg Oral Daily  . budesonide (PULMICORT) nebulizer solution  0.5 mg Nebulization BID  . carbidopa-levodopa  2 tablet Oral  BID  . cholecalciferol  4,000 Units Oral Daily  . enoxaparin (LOVENOX) injection  40 mg Subcutaneous Q24H  . feeding supplement (ENSURE ENLIVE)  237 mL Oral BID BM  . insulin aspart  0-9 Units Subcutaneous TID WC  . ipratropium-albuterol  3 mL Nebulization Q6H  . methylPREDNISolone (SOLU-MEDROL) injection  60 mg Intravenous Q6H  . pantoprazole  80 mg Oral Daily  . potassium chloride SA  20 mEq Oral Daily  . sertraline  25 mg Oral Daily   Continuous Infusions: . ceFEPime (MAXIPIME) IV 2 g (12/03/18 1125)    Procedures/Studies: Dg Chest 2 View  Result Date: 12/01/2018 CLINICAL DATA:  Cough Pt brought in by EMS due to cough and weakness. Reports she has had a dry cough since Monday and has progressively gotten weak. Denies pain EXAM: CHEST - 2 VIEW COMPARISON:  04/03/2018 FINDINGS: Cardiac silhouette is top-normal in size. No mediastinal or hilar masses. There is no evidence of adenopathy. There are  prominent bronchovascular markings bilaterally with mild interstitial thickening most evident in the lower lungs. No lung consolidation. Chronic bronchitic changes are noted in the posterior lower lobes. No pleural effusion or pneumothorax. Skeletal structures are intact. IMPRESSION: No active cardiopulmonary disease. Electronically Signed   By: Lajean Manes M.D.   On: 12/01/2018 10:27    Orson Eva, DO  [facility] Pager [phone]  If 7PM-7AM, please contact night-coverage [email or web address] Password [email or web address] 12/03/2018, 1:53 PM   LOS: 2 days

## 2018-12-04 LAB — BASIC METABOLIC PANEL
Anion gap: 8 (ref 5–15)
BUN: 24 mg/dL — ABNORMAL HIGH (ref 8–23)
CO2: 28 mmol/L (ref 22–32)
Calcium: 8.1 mg/dL — ABNORMAL LOW (ref 8.9–10.3)
Chloride: 105 mmol/L (ref 98–111)
Creatinine, Ser: 0.77 mg/dL (ref 0.44–1.00)
GFR calc Af Amer: >60 mL/min (ref >60)
GFR calc non Af Amer: >60 mL/min (ref >60)
Glucose, Bld: 176 mg/dL — ABNORMAL HIGH (ref 70–99)
Potassium: 3.4 mmol/L — ABNORMAL LOW (ref 3.5–5.1)
SODIUM: 141 mmol/L (ref 135–145)

## 2018-12-04 LAB — GLUCOSE, CAPILLARY
GLUCOSE-CAPILLARY: 154 mg/dL — AB (ref 70–99)
Glucose-Capillary: 151 mg/dL — ABNORMAL HIGH (ref 70–99)
Glucose-Capillary: 201 mg/dL — ABNORMAL HIGH (ref 70–99)
Glucose-Capillary: 176 mg/dL — ABNORMAL HIGH (ref 70–99)

## 2018-12-04 MED ORDER — ARFORMOTEROL TARTRATE 15 MCG/2ML IN NEBU
15.0000 ug | INHALATION_SOLUTION | Freq: Two times a day (BID) | RESPIRATORY_TRACT | Status: DC
  Administered 2018-12-04 – 2018-12-05 (×3): 15 ug via RESPIRATORY_TRACT
  Filled 2018-12-04 (×3): qty 2

## 2018-12-04 MED ORDER — HALOPERIDOL LACTATE 5 MG/ML IJ SOLN: 2.0000 mg | Freq: Four times a day (QID) | INTRAMUSCULAR | Status: DC | PRN

## 2018-12-04 NOTE — Progress Notes (Signed)
At approximately 2300, patient became very confused and combative.  Patient was trying to climb over the side rail on the bed, hitting, kicking and screaming at staff.  Staff was unable to calm patient down.  Staff attempted to contact patient's daughter and spouse.  Finally reached spouse who agreed to come to the hospital to see if he could help calm her.  Also contacted the on duty Md for something to calm patient.  Md gave order for Haldol 2mg  IM, this was given in her left quadriceps muscle.  Patient's spouse arrived patient calmed some but was still very confused.  Patient's daughter came and was questioning the medications she is receiving.  Stated they had this issue once before when the patient was taking high doses of steroids.  Instructed family they would need to discuss that with the MD.  Patient finally calmed down and went to sleep.  Both family members left stating they would be back in the am to see the doctor. Patient had an uneventful rest of the night.

## 2018-12-04 NOTE — Progress Notes (Addendum)
PROGRESS NOTE  Marie [name] BCW:[id number] DOB: Apr 21, 1936 DOA: 12/01/2018 PCP: [name], MD  Brief History: 83 y.o.femalewith medical history ofprogressive supranuclear palsy/Parkinson's plus syndrome, COPD, peripheral neuropathy,, anxiety, GERD, chronic respiratory failure on as needed 2 L at home, prediabetes, and depression presenting with shortness of breath, coughing, congestion, generalized weakness that has progressively worsened since 11/28/2018. Her symptoms began with coughing and congestion on 11/28/2018. Symptoms continue to worsen with worsening shortness of breath and generalized weakness to the point where she was not able to get out of bed. At baseline, the patient uses a walker to ambulate. The patient has been using her inhalers without much improvement. Because of worsening shortness of breath and wheezing, the patient's family brought the patient to the emergency department for further evaluation.She was started on IV solu-medrol and bronchodilators with slow clinical improvement.  Assessment/Plan:  Acute on chronic respiratory failure with hypoxia -Secondary COPD exacerbation which is likely incited from respiratory infection -Chronically on as needed oxygen nasal cannula -Presently stable on 2 L -Wean oxygen as tolerated back to room air -still wheezing and having dyspnea with mild exertion -add brovana  COPD exacerbation -continuePulmicort -Continueduo nebs -ContinueSolu-Medrol -Influenza PCR--neg  Fever/SIRS -Blood cultures x2 sets -Influenza PCR--neg -discontinueempiric cefepime -check lactate--1.5 -remains afebrile off abx  Acute kidney injury -Baseline creatinine 0.8-1.0 -Presented with serum creatinine of 1.46 -Secondary to volume depletion -IV fluids-->improved -A.m. BMP -Hold furosemide  Impaired glucose tolerance -11/16/2018 hemoglobin A1c 6.4 -Start NovoLog sliding scale as IV steroids will likely  increase CBGs -CBGs controlled  Hypokalemia -Repleted -Check magnesium--2.2  Anxiety/depression -Continue home dose alprazolam and Zoloft  Parkinson's plus syndrome/PSP -Continue home dose of Sinemet -pt had some sundowning evening 2/15 -add haldol prn agitation  Stage 2 sacral wound -present on admission -continue local wound care  Severe malnutrition -supplements    Disposition Plan: Home vs SNF 2/17 if stable Family Communication:Spouse updatedat bedside 12/03/18  Consultants:none  Code Status: DNR  DVT Prophylaxis: [place] Lovenox   Procedures: As Listed in Progress Note Above  Antibiotics: None   Subjective: Pt had some delirium last night.  Denies cp, n/v/d, abd pain.  Still has dry cough.  Denies hemoptysis, f/c, abd pain.  Objective: Vitals:   12/03/18 2158 12/04/18 0512 12/04/18 0738 12/04/18 0743  BP: (!) 125/59 (!) 154/81    Pulse: 91 80    Resp: (!) 21 (!) 22    Temp: 99.7 F (37.6 C) 98.4 F (36.9 C)    TempSrc: Oral Oral    SpO2: 95% 97% 96% 96%  Weight:      Height:        Intake/Output Summary (Last 24 hours) at 12/04/2018 1153 Last data filed at 12/04/2018 0900 Gross per 24 hour  Intake 720 ml  Output 1200 ml  Net -480 ml   Weight change:  Exam:   General:  Pt is alert, follows commands appropriately, not in acute distress  HEENT: No icterus, No thrush, No neck mass, [place]/AT  Cardiovascular: RRR, S1/S2, no rubs, no gallops  Respiratory: bibasilar rales, bibasilar wheeze  Abdomen: Soft/+BS, non tender, non distended, no guarding  Extremities: trace LE edema, No lymphangitis, No petechiae, No rashes, no synovitis   Data Reviewed: I have personally reviewed following labs and imaging studies Basic Metabolic Panel: Recent Labs  Lab 12/01/18 0951 12/02/18 0458 12/03/18 0703 12/04/18 0635  NA 141 141 140 141  K 3.3* 4.2 4.3 3.4*  CL 104  110 110 105  CO2 29 22 23 28   GLUCOSE 105* 189* 146* 176*    BUN 27* 28* 31* 24*  CREATININE 1.46* 1.09* 0.99 0.77  CALCIUM 8.4* 8.1* 8.2* 8.1*  MG  --  2.2  --   --    Liver Function Tests: No results for input(s): AST, ALT, ALKPHOS, BILITOT, PROT, ALBUMIN in the last 168 hours. No results for input(s): LIPASE, AMYLASE in the last 168 hours. No results for input(s): AMMONIA in the last 168 hours. Coagulation Profile: No results for input(s): INR, PROTIME in the last 168 hours. CBC: Recent Labs  Lab 12/01/18 0951 12/02/18 0458 12/03/18 0703  WBC 5.2 2.5* 7.8  NEUTROABS 3.3  --   --   HGB 13.6 12.3 11.7*  HCT 46.3* 42.8 39.4  MCV 87.4 87.2 87.6  PLT 183 167 174   Cardiac Enzymes: No results for input(s): CKTOTAL, CKMB, CKMBINDEX, TROPONINI in the last 168 hours. BNP: Invalid input(s): POCBNP CBG: Recent Labs  Lab 12/03/18 1131 12/03/18 1605 12/03/18 2156 12/04/18 0720 12/04/18 1112  GLUCAP 166* 183* 139* 151* 201*   HbA1C: No results for input(s): HGBA1C in the last 72 hours. Urine analysis:    Component Value Date/Time   COLORURINE YELLOW 05/16/2018 [place] 05/16/2018 1453   LABSPEC 1.014 05/16/2018 1453   PHURINE 6.0 05/16/2018 1453   GLUCOSEU NEGATIVE 05/16/2018 1453   HGBUR NEGATIVE 05/16/2018 1453   BILIRUBINUR NEGATIVE 01/22/2018 2300   KETONESUR NEGATIVE 05/16/2018 1453   PROTEINUR NEGATIVE 05/16/2018 1453   UROBILINOGEN 0.2 04/16/2015 2310   NITRITE NEGATIVE 05/16/2018 1453   LEUKOCYTESUR NEGATIVE 05/16/2018 1453   Sepsis Labs: @LABRCNTIP (procalcitonin:4,lacticidven:4) ) Recent Results (from the past 240 hour(s))  Blood culture (routine x 2)     Status: None (Preliminary result)   Collection Time: 12/01/18  1:14 PM  Result Value Ref Range Status   Specimen Description BLOOD RIGHT FOREARM  Final   Special Requests   Final    BOTTLES DRAWN AEROBIC AND ANAEROBIC Blood Culture adequate volume   Culture   Final    NO GROWTH 3 DAYS Performed at [facility], [address]  [place], [place] [address]    Report Status PENDING  Incomplete  Blood culture (routine x 2)     Status: None (Preliminary result)   Collection Time: 12/01/18  1:14 PM  Result Value Ref Range Status   Specimen Description BLOOD LEFT HAND  Final   Special Requests   Final    BOTTLES DRAWN AEROBIC ONLY Blood Culture results may not be optimal due to an inadequate volume of blood received in culture bottles   Culture   Final    NO GROWTH 3 DAYS Performed at [facility], [address] [place], [place] [address]    Report Status PENDING  Incomplete  MRSA PCR Screening     Status: Abnormal   Collection Time: 12/02/18  2:51 PM  Result Value Ref Range Status   MRSA by PCR POSITIVE (A) NEGATIVE Final    Comment:        The GeneXpert MRSA Assay (FDA approved for NASAL specimens only), is one component of a comprehensive MRSA colonization surveillance program. It is not intended to diagnose MRSA infection nor to guide or monitor treatment for MRSA infections. RESULT CALLED TO, READ BACK BY AND VERIFIED WITH: ALSTON,C @ 1940 ON 12/02/18 BY JUW Performed at [facility], [address] [place],  [address]      Scheduled Meds: .  arformoterol  15 mcg Nebulization BID  . aspirin EC  81 mg Oral Daily  . budesonide (PULMICORT) nebulizer solution  0.5 mg Nebulization BID  . carbidopa-levodopa  2 tablet Oral BID  . cholecalciferol  4,000 Units Oral Daily  . enoxaparin (LOVENOX) injection  40 mg Subcutaneous Q24H  . feeding supplement (ENSURE ENLIVE)  237 mL Oral BID BM  . furosemide  20 mg Oral Daily  . insulin aspart  0-9 Units Subcutaneous TID WC  . ipratropium-albuterol  3 mL Nebulization Q6H  . methylPREDNISolone (SOLU-MEDROL) injection  60 mg Intravenous Q6H  . pantoprazole  80 mg Oral Daily  . potassium chloride SA  20 mEq Oral Daily  . sertraline  25 mg Oral Daily   Continuous Infusions:  Procedures/Studies: Dg Chest 2 View  Result Date: 12/01/2018 CLINICAL DATA:  Cough Pt  brought in by EMS due to cough and weakness. Reports she has had a dry cough since Monday and has progressively gotten weak. Denies pain EXAM: CHEST - 2 VIEW COMPARISON:  04/03/2018 FINDINGS: Cardiac silhouette is top-normal in size. No mediastinal or hilar masses. There is no evidence of adenopathy. There are prominent bronchovascular markings bilaterally with mild interstitial thickening most evident in the lower lungs. No lung consolidation. Chronic bronchitic changes are noted in the posterior lower lobes. No pleural effusion or pneumothorax. Skeletal structures are intact. IMPRESSION: No active cardiopulmonary disease. Electronically Signed   By: Lajean Manes M.D.   On: 12/01/2018 10:27    Orson Eva, DO  [facility] Pager [phone]  If 7PM-7AM, please contact night-coverage [email or web address] Password [facility] 12/04/2018, 11:53 AM   LOS: 3 days

## 2018-12-05 DIAGNOSIS — F411 Generalized anxiety disorder: Secondary | ICD-10-CM | POA: Diagnosis present

## 2018-12-05 DIAGNOSIS — F339 Major depressive disorder, recurrent, unspecified: Secondary | ICD-10-CM | POA: Diagnosis present

## 2018-12-05 DIAGNOSIS — R5381 Other malaise: Secondary | ICD-10-CM | POA: Diagnosis not present

## 2018-12-05 DIAGNOSIS — G20 Parkinson's disease: Secondary | ICD-10-CM | POA: Diagnosis present

## 2018-12-05 DIAGNOSIS — J449 Chronic obstructive pulmonary disease, unspecified: Secondary | ICD-10-CM | POA: Diagnosis not present

## 2018-12-05 DIAGNOSIS — G232 Striatonigral degeneration: Secondary | ICD-10-CM | POA: Diagnosis not present

## 2018-12-05 DIAGNOSIS — I517 Cardiomegaly: Secondary | ICD-10-CM | POA: Diagnosis not present

## 2018-12-05 DIAGNOSIS — E46 Unspecified protein-calorie malnutrition: Secondary | ICD-10-CM | POA: Diagnosis present

## 2018-12-05 DIAGNOSIS — F419 Anxiety disorder, unspecified: Secondary | ICD-10-CM | POA: Diagnosis not present

## 2018-12-05 DIAGNOSIS — R1312 Dysphagia, oropharyngeal phase: Secondary | ICD-10-CM | POA: Diagnosis present

## 2018-12-05 DIAGNOSIS — N179 Acute kidney failure, unspecified: Secondary | ICD-10-CM | POA: Diagnosis not present

## 2018-12-05 DIAGNOSIS — R69 Illness, unspecified: Secondary | ICD-10-CM | POA: Diagnosis not present

## 2018-12-05 DIAGNOSIS — R7303 Prediabetes: Secondary | ICD-10-CM | POA: Diagnosis not present

## 2018-12-05 DIAGNOSIS — G9009 Other idiopathic peripheral autonomic neuropathy: Secondary | ICD-10-CM | POA: Diagnosis present

## 2018-12-05 DIAGNOSIS — K219 Gastro-esophageal reflux disease without esophagitis: Secondary | ICD-10-CM | POA: Diagnosis present

## 2018-12-05 DIAGNOSIS — Z7401 Bed confinement status: Secondary | ICD-10-CM | POA: Diagnosis not present

## 2018-12-05 DIAGNOSIS — R000 Tachycardia, unspecified: Secondary | ICD-10-CM | POA: Diagnosis present

## 2018-12-05 DIAGNOSIS — G5793 Unspecified mononeuropathy of bilateral lower limbs: Secondary | ICD-10-CM | POA: Diagnosis not present

## 2018-12-05 DIAGNOSIS — E43 Unspecified severe protein-calorie malnutrition: Secondary | ICD-10-CM | POA: Diagnosis not present

## 2018-12-05 DIAGNOSIS — M6281 Muscle weakness (generalized): Secondary | ICD-10-CM | POA: Diagnosis present

## 2018-12-05 DIAGNOSIS — J9621 Acute and chronic respiratory failure with hypoxia: Secondary | ICD-10-CM | POA: Diagnosis not present

## 2018-12-05 DIAGNOSIS — B370 Candidal stomatitis: Secondary | ICD-10-CM | POA: Diagnosis not present

## 2018-12-05 DIAGNOSIS — J441 Chronic obstructive pulmonary disease with (acute) exacerbation: Secondary | ICD-10-CM | POA: Diagnosis not present

## 2018-12-05 DIAGNOSIS — I119 Hypertensive heart disease without heart failure: Secondary | ICD-10-CM | POA: Diagnosis not present

## 2018-12-05 DIAGNOSIS — B001 Herpesviral vesicular dermatitis: Secondary | ICD-10-CM | POA: Diagnosis not present

## 2018-12-05 DIAGNOSIS — F329 Major depressive disorder, single episode, unspecified: Secondary | ICD-10-CM | POA: Diagnosis not present

## 2018-12-05 LAB — GLUCOSE, CAPILLARY
Glucose-Capillary: 171 mg/dL — ABNORMAL HIGH (ref 70–99)
Glucose-Capillary: 170 mg/dL — ABNORMAL HIGH (ref 70–99)
Glucose-Capillary: 163 mg/dL — ABNORMAL HIGH (ref 70–99)

## 2018-12-05 MED ORDER — PREDNISONE 10 MG PO TABS: 60.0000 mg | ORAL_TABLET | Freq: Every day | ORAL | 0 refills | Status: DC

## 2018-12-05 MED ORDER — CHLORHEXIDINE GLUCONATE CLOTH 2 % EX PADS
6.0000 | MEDICATED_PAD | Freq: Every day | CUTANEOUS | Status: DC
  Administered 2018-12-05: 6 via TOPICAL

## 2018-12-05 MED ORDER — METOPROLOL TARTRATE 25 MG PO TABS: 12.5000 mg | ORAL_TABLET | Freq: Two times a day (BID) | ORAL | 1 refills | Status: DC

## 2018-12-05 MED ORDER — MUPIROCIN 2 % EX OINT
1.0000 "application " | TOPICAL_OINTMENT | Freq: Two times a day (BID) | CUTANEOUS | Status: DC
  Administered 2018-12-05: 1 via NASAL
  Filled 2018-12-05: qty 22

## 2018-12-05 MED ORDER — ALPRAZOLAM 0.5 MG PO TABS: ORAL_TABLET | ORAL | 0 refills | Status: DC

## 2018-12-05 MED ORDER — METOPROLOL TARTRATE 25 MG PO TABS
12.5000 mg | ORAL_TABLET | Freq: Two times a day (BID) | ORAL | Status: DC
  Administered 2018-12-05: 12.5 mg via ORAL
  Filled 2018-12-05: qty 1

## 2018-12-05 MED ORDER — POTASSIUM CHLORIDE CRYS ER 20 MEQ PO TBCR
20.0000 meq | EXTENDED_RELEASE_TABLET | Freq: Once | ORAL | Status: AC
  Administered 2018-12-05: 20 meq via ORAL
  Filled 2018-12-05: qty 1

## 2018-12-05 MED ORDER — PREDNISONE 20 MG PO TABS: 60.0000 mg | ORAL_TABLET | Freq: Every day | ORAL | Status: DC

## 2018-12-05 NOTE — Discharge Summary (Signed)
Physician Discharge Summary  Cherly Erno Mallari CBJ:[id number] DOB: [birth year] DOA: 12/01/2018  PCP: [name], MD  Admit date: 12/01/2018 Discharge date: 12/05/2018  Admitted From: Home Disposition:  Home   Recommendations for Outpatient Follow-up:  1. Follow up with PCP in 1-2 weeks 2. Please obtain BMP/CBC in one week    Discharge Condition: Stable CODE STATUS: FULL Diet recommendation: Heart Healthy    Brief/Interim Summary: 83 y.o.femalewith medical history ofprogressive supranuclear palsy/Parkinson's plus syndrome, COPD, peripheral neuropathy,, anxiety, GERD, chronic respiratory failure on as needed 2 L at home, prediabetes, and depression presenting with shortness of breath, coughing, congestion, generalized weakness that has progressively worsened since 11/28/2018. Her symptoms began with coughing and congestion on 11/28/2018. Symptoms continue to worsen with worsening shortness of breath and generalized weakness to the point where she was not able to get out of bed. At baseline, the patient uses a walker to ambulate. The patient has been using her inhalers without much improvement. Because of worsening shortness of breath and wheezing, the patient's family brought the patient to the emergency department for further evaluation.She was started on IV solu-medrol and bronchodilators with slow clinical improvement.  Discharge Diagnoses:  Acute on chronic respiratory failure with hypoxia -Secondary COPD exacerbation which is likely incited from respiratory infection -Chronically on as needed oxygen nasal cannula -Presently stable on2L>>>weaned to RA -Wean oxygen as tolerated back to room air -still wheezing and having dyspnea with mildexertion -added brovana  COPD exacerbation -continuePulmicort -Continueduo nebs -ContinueSolu-Medrol>>>>discharge with prednisone 60 mg daily--decrease by 10 mg every 2 days -Influenza PCR--neg -resume Trelegy after  d/c  Fever/SIRS -Blood cultures x2 sets--neg to date -Influenza PCR--neg -discontinueempiric cefepime-->remained afebrile and stable -check lactate--1.5 -remains afebrile off abx  Acute kidney injury -Baseline creatinine 0.8-1.0 -Presented with serum creatinine of 1.46 -Secondary to volume depletion -IV fluids-->improved -A.m. BMP -Hold furosemide--restart after d/c -serum creatinine 0.77 at time  of dc  SVT/Atrial tachycardia -had a few nonsustained episodes, due to COPD exac -metoprolol 12.5 mg bid  Impaired glucose tolerance -11/16/2018 hemoglobin A1c 6.4 -Start NovoLog sliding scale as IV steroids will likely increase CBGs -CBGs controlled  Hypokalemia -Repleted -Check magnesium--2.2  Anxiety/depression -Continue home dose alprazolam and Zoloft  Parkinson's plus syndrome/PSP -Continue home dose of Sinemet -pt had some sundowning evening 2/15 -add haldol prn agitation  Stage 2 sacral wound -present on admission -continue local wound care  Severe malnutrition -supplements    Discharge Instructions   Allergies as of 12/05/2018      Reactions   Demerol  [meperidine Hcl] Other (See Comments)   Pt. Feels like she is suffocating   Ertapenem Hives   Caused whole body to turn red Other reaction(s): Redness Caused whole body to turn red   Codeine Other (See Comments)   hallucinations   Demerol Other (See Comments)   hallucinations   Morphine And Related Other (See Comments)   hallucinations   Other Other (See Comments)   Invan 7- pt became red all over   Sulfate Other (See Comments)   unknown   Sulfa Antibiotics Rash      Medication List    TAKE these medications   albuterol (2.5 MG/3ML) 0.083% nebulizer solution Commonly known as:  PROVENTIL Use 1 ampule in nebulizer 4 times a day or every 4 hours as needed to rescue asthma. What changed:    how much to take  how to take this  when to take this   ALPRAZolam 0.5 MG  tablet Commonly known as:  Duanne Moron  Take 1/2-1 tablet 2 - 3 x /day ONLY if needed for Anxiety Attack &  limit to 5 days /week to avoid addiction   aspirin 81 MG tablet Take 81 mg by mouth daily. Buys OTC   carbidopa-levodopa 25-100 MG tablet Commonly known as:  SINEMET IR Take 2 tablet at 9am and 2 tablets at 3pm. What changed:    how much to take  how to take this  when to take this   ENSURE Take 237 mLs by mouth daily as needed.   furosemide 20 MG tablet Commonly known as:  LASIX Increase lasix for 40 mg (2 tabs) daily for 3-5 days until swelling resolves, then resume 20 mg (1 tab daily). What changed:    how much to take  how to take this  when to take this   metoprolol tartrate 25 MG tablet Commonly known as:  LOPRESSOR Take 0.5 tablets (12.5 mg total) by mouth 2 (two) times daily.   omeprazole 40 MG capsule Commonly known as:  PRILOSEC Take 1 capsule (40 mg total) by mouth daily.   OXYGEN Inhale 2 L into the lungs daily as needed (for shortness of breath). Setting is on 2 when needed   potassium chloride SA 20 MEQ tablet Commonly known as:  K-DUR,KLOR-CON Take 1 tablet by mouth daily.   predniSONE 10 MG tablet Commonly known as:  DELTASONE Take 6 tablets (60 mg total) by mouth daily with breakfast. And decrease by one tablet every 2 days Start taking on:  December 06, 2018 What changed:    how much to take  additional instructions   sertraline 25 MG tablet Commonly known as:  ZOLOFT Take 1 tablet (25 mg total) by mouth daily.   TRELEGY ELLIPTA 100-62.5-25 MCG/INH Aepb Generic drug:  Fluticasone-Umeclidin-Vilant INHALE 1 PUFF ONCE DAILY   Vitamin D 50 MCG (2000 UT) Caps Take 2 capsules (4,000 Units total) by mouth daily. What changed:  how much to take       Allergies  Allergen Reactions  . Demerol  [Meperidine Hcl] Other (See Comments)    Pt. Feels like she is suffocating  . Ertapenem Hives    Caused whole body to turn red Other  reaction(s): Redness Caused whole body to turn red  . Codeine Other (See Comments)    hallucinations  . Demerol Other (See Comments)    hallucinations  . Morphine And Related Other (See Comments)    hallucinations  . Other Other (See Comments)    Invan 7- pt became red all over  . Sulfate Other (See Comments)    unknown  . Sulfa Antibiotics Rash    Consultations:  none   Procedures/Studies: Dg Chest 2 View  Result Date: 12/01/2018 CLINICAL DATA:  Cough Pt brought in by EMS due to cough and weakness. Reports she has had a dry cough since Monday and has progressively gotten weak. Denies pain EXAM: CHEST - 2 VIEW COMPARISON:  04/03/2018 FINDINGS: Cardiac silhouette is top-normal in size. No mediastinal or hilar masses. There is no evidence of adenopathy. There are prominent bronchovascular markings bilaterally with mild interstitial thickening most evident in the lower lungs. No lung consolidation. Chronic bronchitic changes are noted in the posterior lower lobes. No pleural effusion or pneumothorax. Skeletal structures are intact. IMPRESSION: No active cardiopulmonary disease. Electronically Signed   By: Lajean Manes M.D.   On: 12/01/2018 10:27         Discharge Exam: Vitals:   12/05/18 0747 12/05/18 0753  BP:  Pulse:    Resp:    Temp:    SpO2: 100% 100%   Vitals:   12/05/18 0640 12/05/18 0741 12/05/18 0747 12/05/18 0753  BP: (!) 159/76     Pulse: 77     Resp: 18     Temp: 98.3 F (36.8 C)     TempSrc: Oral     SpO2: 97% 97% 100% 100%  Weight:      Height:        General: Pt is alert, awake, not in acute distress Cardiovascular: RRR, S1/S2 +, no rubs, no gallops Respiratory: bilateral scattered rales. No wheeze Abdominal: Soft, NT, ND, bowel sounds + Extremities: no edema, no cyanosis   The results of significant diagnostics from this hospitalization (including imaging, microbiology, ancillary and laboratory) are listed below for reference.     Significant Diagnostic Studies: Dg Chest 2 View  Result Date: 12/01/2018 CLINICAL DATA:  Cough Pt brought in by EMS due to cough and weakness. Reports she has had a dry cough since Monday and has progressively gotten weak. Denies pain EXAM: CHEST - 2 VIEW COMPARISON:  04/03/2018 FINDINGS: Cardiac silhouette is top-normal in size. No mediastinal or hilar masses. There is no evidence of adenopathy. There are prominent bronchovascular markings bilaterally with mild interstitial thickening most evident in the lower lungs. No lung consolidation. Chronic bronchitic changes are noted in the posterior lower lobes. No pleural effusion or pneumothorax. Skeletal structures are intact. IMPRESSION: No active cardiopulmonary disease. Electronically Signed   By: Lajean Manes M.D.   On: 12/01/2018 10:27     Microbiology: Recent Results (from the past 240 hour(s))  Blood culture (routine x 2)     Status: None (Preliminary result)   Collection Time: 12/01/18  1:14 PM  Result Value Ref Range Status   Specimen Description BLOOD RIGHT FOREARM  Final   Special Requests   Final    BOTTLES DRAWN AEROBIC AND ANAEROBIC Blood Culture adequate volume   Culture   Final    NO GROWTH 4 DAYS Performed at [facility], [address] [place], [place] [address]    Report Status PENDING  Incomplete  Blood culture (routine x 2)     Status: None (Preliminary result)   Collection Time: 12/01/18  1:14 PM  Result Value Ref Range Status   Specimen Description BLOOD LEFT HAND  Final   Special Requests   Final    BOTTLES DRAWN AEROBIC ONLY Blood Culture results may not be optimal due to an inadequate volume of blood received in culture bottles   Culture   Final    NO GROWTH 4 DAYS Performed at [facility], [address], [place], [place] [address]    Report Status PENDING  Incomplete  MRSA PCR Screening     Status: Abnormal   Collection Time: 12/02/18  2:51 PM  Result Value Ref Range Status   MRSA by PCR POSITIVE (A)  NEGATIVE Final    Comment:        The GeneXpert MRSA Assay (FDA approved for NASAL specimens only), is one component of a comprehensive MRSA colonization surveillance program. It is not intended to diagnose MRSA infection nor to guide or monitor treatment for MRSA infections. RESULT CALLED TO, READ BACK BY AND VERIFIED WITH: ALSTON,C @ 1940 ON 12/02/18 BY JUW Performed at [facility], [address] [place], [place] [address]      Labs: Basic Metabolic Panel: Recent Labs  Lab 12/01/18 [phone] 12/02/18 0458 12/03/18 0703 12/04/18 0635  NA  141 141 140 141  K 3.3* 4.2 4.3 3.4*  CL 104 110 110 105  CO2 29 22 23 28   GLUCOSE 105* 189* 146* 176*  BUN 27* 28* 31* 24*  CREATININE 1.46* 1.09* 0.99 0.77  CALCIUM 8.4* 8.1* 8.2* 8.1*  MG  --  2.2  --   --    Liver Function Tests: No results for input(s): AST, ALT, ALKPHOS, BILITOT, PROT, ALBUMIN in the last 168 hours. No results for input(s): LIPASE, AMYLASE in the last 168 hours. No results for input(s): AMMONIA in the last 168 hours. CBC: Recent Labs  Lab 12/01/18 0951 12/02/18 0458 12/03/18 0703  WBC 5.2 2.5* 7.8  NEUTROABS 3.3  --   --   HGB 13.6 12.3 11.7*  HCT 46.3* 42.8 39.4  MCV 87.4 87.2 87.6  PLT 183 167 174   Cardiac Enzymes: No results for input(s): CKTOTAL, CKMB, CKMBINDEX, TROPONINI in the last 168 hours. BNP: Invalid input(s): POCBNP CBG: Recent Labs  Lab 12/04/18 1112 12/04/18 1601 12/04/18 2215 12/05/18 0758 12/05/18 1116  GLUCAP 201* 154* 176* 171* 170*    Time coordinating discharge:  36 minutes  Signed:  Orson Eva, DO [facility] Pager: [phone] 12/05/2018, 12:43 PM

## 2018-12-05 NOTE — Progress Notes (Signed)
Physical Therapy Treatment Patient Details Name: Marie [name] MRN: [id number] DOB: 1936/08/17 Today's Date: 12/05/2018    History of Present Illness Marie [name] is a 83 y.o. female with medical history of progressive supranuclear palsy/Parkinson's plus syndrome, COPD, peripheral neuropathy,, anxiety, GERD, chronic respiratory failure on as needed 2 L at home, prediabetes, and depression presenting with shortness of breath, coughing, congestion, generalized weakness that has progressively worsened since 11/28/2018.  Her symptoms began with coughing and congestion on 11/28/2018.  Symptoms continue to worsen with worsening shortness of breath and generalized weakness to the point where she was not able to get out of bed.  At baseline, the patient uses a walker to ambulate.  The patient has been using her inhalers without much improvement.  Because of worsening shortness of breath and wheezing, the patient's family brought the patient to the emergency department for further evaluation.  The patient denies any fevers, chills, chest pain, headache, sore throat, neck pain, nausea, vomiting, diarrhea, abdominal pain, dysuria, hematuria.  She does complain of cough and shortness of breath.  There is no hemoptysis.    PT Comments    Patient tolerated treatment to include functional mobility and walking while on room air with O2 saturation at 95-97%.  Patient presents seated in chair (assisted by nursing staff), demonstrates increased endurance for sit to stands, transfers and taking steps with RW, requires verbal/tactile cueing for proper hand placement during sit to stands/transfers with fair/good carryover, limited for taking steps due to poor standing balance with ataxic like movement of legs and c/o fatigue.  Patient continued sitting up in chair with visitor present in room after therapy - RN aware.  Patient will benefit from continued physical therapy in hospital and recommended venue below to increase  strength, balance, endurance for safe ADLs and gait.    Follow Up Recommendations  SNF     Equipment Recommendations  None recommended by PT    Recommendations for Other Services       Precautions / Restrictions Precautions Precautions: Fall Restrictions Weight Bearing Restrictions: No    Mobility  Bed Mobility               General bed mobility comments: patient presents up in chair  Transfers Overall transfer level: Needs assistance Equipment used: Rolling walker (2 wheeled) Transfers: Sit to/from [facility] Sit to Stand: Min assist;Mod assist Stand pivot transfers: Mod assist       General transfer comment: unsteady on feet, requires verbal cues for proper hand placement during sit to stands transfers, tends to drop into chair without reaching behind during stands to sit  Ambulation/Gait Ambulation/Gait assistance: Mod assist Gait Distance (Feet): 15 Feet Assistive device: Rolling walker (2 wheeled) Gait Pattern/deviations: Decreased step length - right;Decreased step length - left;Decreased stride length Gait velocity: slow   General Gait Details: slow labored cadence with ataxic like movement of BLE when taking steps, limited secondary to fatigue, fair/poor standing balance, O2 saturation at 97% when walking while on room air   Stairs             Wheelchair Mobility    Modified Rankin (Stroke Patients Only)       Balance Overall balance assessment: Needs assistance Sitting-balance support: Feet supported;Bilateral upper extremity supported Sitting balance-Leahy Scale: Fair Sitting balance - Comments: tends to lean backwards while completing BLE exercises Postural control: Posterior lean Standing balance support: Bilateral upper extremity supported;During functional activity Standing balance-Leahy Scale: Poor Standing balance comment: fair/poor using RW  Cognition Arousal/Alertness:  Awake/alert Behavior During Therapy: WFL for tasks assessed/performed Overall Cognitive Status: Within Functional Limits for tasks assessed                                        Exercises General Exercises - Lower Extremity Long Arc Quad: Seated;Strengthening;AROM;Both;10 reps Hip Flexion/Marching: Seated;Strengthening;AROM;Both;10 reps Toe Raises: Seated;Strengthening;AROM;Both;10 reps Heel Raises: Seated;AROM;Strengthening;Both;10 reps    General Comments        Pertinent Vitals/Pain Pain Assessment: No/denies pain    Home Living                      Prior Function            PT Goals (current goals can now be found in the care plan section) Acute Rehab PT Goals Patient Stated Goal: return home after rehab PT Goal Formulation: With patient Time For Goal Achievement: 12/16/18 Potential to Achieve Goals: Good Progress towards PT goals: Progressing toward goals    Frequency    Min 3X/week      PT Plan Current plan remains appropriate    Co-evaluation              AM-PAC PT "6 Clicks" Mobility   Outcome Measure  Help needed turning from your back to your side while in a flat bed without using bedrails?: A Little Help needed moving from lying on your back to sitting on the side of a flat bed without using bedrails?: A Little Help needed moving to and from a bed to a chair (including a wheelchair)?: A Lot   Help needed to walk in hospital room?: A Lot Help needed climbing 3-5 steps with a railing? : Total 6 Click Score: 11    End of Session Equipment Utilized During Treatment: Gait belt Activity Tolerance: Patient tolerated treatment well;Patient limited by fatigue Patient left: in chair;with call bell/phone within reach;with family/visitor present;Other (comment) Nurse Communication: Mobility status PT Visit Diagnosis: Unsteadiness on feet (R26.81);Other abnormalities of gait and mobility (R26.89);Muscle weakness (generalized)  (M62.81)     Time: 9242-6834 PT Time Calculation (min) (ACUTE ONLY): 29 min  Charges:  $Therapeutic Exercise: 8-22 mins $Therapeutic Activity: 8-22 mins                     12:05 PM, 12/05/18 Lonell Grandchild, MPT Physical Therapist with [facility] [phone] office [phone] mobile phone

## 2018-12-05 NOTE — Clinical Social Work Note (Signed)
Patient and spouse now desire SNF. Referrals sent to requested facilities.   Bridgitte Felicetti, Clydene Pugh, LCSW

## 2018-12-05 NOTE — Clinical Social Work Placement (Signed)
   CLINICAL SOCIAL WORK PLACEMENT  NOTE  Date:  12/05/2018  Patient Details  Name: Marie [name] MRN: [id number] Date of Birth: [birth year]  Clinical Social Work is seeking post-discharge placement for this patient at the [place] level of care (*CSW will initial, date and re-position this form in  chart as items are completed):  Yes   Patient/family provided with [facility] Work Department's list of facilities offering this level of care within the geographic area requested by the patient (or if unable, by the patient's family).  Yes   Patient/family informed of their freedom to choose among providers that offer the needed level of care, that participate in Medicare, Medicaid or managed care program needed by the patient, have an available bed and are willing to accept the patient.  Yes   Patient/family informed of [facility]'s ownership interest in [facility] and [facility], as well as of the fact that they are under no obligation to receive care at these facilities.  PASRR submitted to EDS on       PASRR number received on       Existing PASRR number confirmed on 12/02/18     FL2 transmitted to all facilities in geographic area requested by pt/family on 12/05/18     FL2 transmitted to all facilities within larger geographic area on       Patient informed that his/her managed care company has contracts with or will negotiate with certain facilities, including the following:            Patient/family informed of bed offers received.  Patient chooses bed at [facility]     Physician recommends and patient chooses bed at      Patient to be transferred to [facility] [place] on 12/05/18.  Patient to be transferred to facility by [facility]     Patient family notified on 12/05/18 of transfer.  Name of family member notified:  spouse at bedside     PHYSICIAN       Additional Comment:  RN to call EMS. LCSW signing off.    _______________________________________________ Ihor Gully, LCSW 12/05/2018, 4:28 PM

## 2018-12-05 NOTE — Progress Notes (Signed)
IV removed and discharge instructions reviewed with husband and report called to [place] at [place].  EMS called

## 2018-12-05 NOTE — Care Management Important Message (Signed)
Important Message  Patient Details  Name: Marie [name] MRN: [id number] Date of Birth: 1936/02/23   Medicare Important Message Given:  Yes    Sherald Barge, RN 12/05/2018, 3:44 PM

## 2018-12-06 DIAGNOSIS — J441 Chronic obstructive pulmonary disease with (acute) exacerbation: Secondary | ICD-10-CM | POA: Diagnosis not present

## 2018-12-06 DIAGNOSIS — J9621 Acute and chronic respiratory failure with hypoxia: Secondary | ICD-10-CM | POA: Diagnosis not present

## 2018-12-06 DIAGNOSIS — R7303 Prediabetes: Secondary | ICD-10-CM | POA: Diagnosis not present

## 2018-12-06 LAB — CULTURE, BLOOD (ROUTINE X 2)
CULTURE: NO GROWTH
Culture: NO GROWTH
Special Requests: ADEQUATE

## 2018-12-07 DIAGNOSIS — J9621 Acute and chronic respiratory failure with hypoxia: Secondary | ICD-10-CM | POA: Diagnosis not present

## 2018-12-07 DIAGNOSIS — G232 Striatonigral degeneration: Secondary | ICD-10-CM | POA: Diagnosis not present

## 2018-12-07 DIAGNOSIS — J441 Chronic obstructive pulmonary disease with (acute) exacerbation: Secondary | ICD-10-CM | POA: Diagnosis not present

## 2018-12-07 DIAGNOSIS — F329 Major depressive disorder, single episode, unspecified: Secondary | ICD-10-CM | POA: Diagnosis not present

## 2018-12-08 DIAGNOSIS — B001 Herpesviral vesicular dermatitis: Secondary | ICD-10-CM | POA: Diagnosis not present

## 2018-12-08 DIAGNOSIS — F329 Major depressive disorder, single episode, unspecified: Secondary | ICD-10-CM | POA: Diagnosis not present

## 2018-12-08 DIAGNOSIS — J441 Chronic obstructive pulmonary disease with (acute) exacerbation: Secondary | ICD-10-CM | POA: Diagnosis not present

## 2018-12-12 DIAGNOSIS — R7303 Prediabetes: Secondary | ICD-10-CM | POA: Diagnosis not present

## 2018-12-12 DIAGNOSIS — J441 Chronic obstructive pulmonary disease with (acute) exacerbation: Secondary | ICD-10-CM | POA: Diagnosis not present

## 2018-12-12 DIAGNOSIS — B001 Herpesviral vesicular dermatitis: Secondary | ICD-10-CM | POA: Diagnosis not present

## 2018-12-12 DIAGNOSIS — K219 Gastro-esophageal reflux disease without esophagitis: Secondary | ICD-10-CM | POA: Diagnosis not present

## 2018-12-13 DIAGNOSIS — J441 Chronic obstructive pulmonary disease with (acute) exacerbation: Secondary | ICD-10-CM | POA: Diagnosis not present

## 2018-12-13 DIAGNOSIS — F419 Anxiety disorder, unspecified: Secondary | ICD-10-CM | POA: Diagnosis not present

## 2018-12-13 DIAGNOSIS — B001 Herpesviral vesicular dermatitis: Secondary | ICD-10-CM | POA: Diagnosis not present

## 2018-12-13 DIAGNOSIS — G5793 Unspecified mononeuropathy of bilateral lower limbs: Secondary | ICD-10-CM | POA: Diagnosis not present

## 2018-12-14 ENCOUNTER — Ambulatory Visit: Payer: Medicare Other | Admitting: Neurology

## 2018-12-15 DIAGNOSIS — B001 Herpesviral vesicular dermatitis: Secondary | ICD-10-CM | POA: Diagnosis not present

## 2018-12-19 DIAGNOSIS — B001 Herpesviral vesicular dermatitis: Secondary | ICD-10-CM | POA: Diagnosis not present

## 2018-12-19 DIAGNOSIS — J441 Chronic obstructive pulmonary disease with (acute) exacerbation: Secondary | ICD-10-CM | POA: Diagnosis not present

## 2018-12-19 DIAGNOSIS — B370 Candidal stomatitis: Secondary | ICD-10-CM | POA: Diagnosis not present

## 2018-12-22 DIAGNOSIS — J449 Chronic obstructive pulmonary disease, unspecified: Secondary | ICD-10-CM | POA: Diagnosis not present

## 2018-12-22 DIAGNOSIS — B001 Herpesviral vesicular dermatitis: Secondary | ICD-10-CM | POA: Diagnosis not present

## 2018-12-22 DIAGNOSIS — B370 Candidal stomatitis: Secondary | ICD-10-CM | POA: Diagnosis not present

## 2018-12-28 DIAGNOSIS — I517 Cardiomegaly: Secondary | ICD-10-CM | POA: Diagnosis not present

## 2018-12-28 DIAGNOSIS — G232 Striatonigral degeneration: Secondary | ICD-10-CM | POA: Diagnosis not present

## 2018-12-28 DIAGNOSIS — J441 Chronic obstructive pulmonary disease with (acute) exacerbation: Secondary | ICD-10-CM | POA: Diagnosis not present

## 2018-12-28 DIAGNOSIS — I119 Hypertensive heart disease without heart failure: Secondary | ICD-10-CM | POA: Diagnosis not present

## 2019-01-02 DIAGNOSIS — F329 Major depressive disorder, single episode, unspecified: Secondary | ICD-10-CM | POA: Diagnosis not present

## 2019-01-05 DIAGNOSIS — J449 Chronic obstructive pulmonary disease, unspecified: Secondary | ICD-10-CM | POA: Diagnosis not present

## 2019-01-05 DIAGNOSIS — R7303 Prediabetes: Secondary | ICD-10-CM | POA: Diagnosis not present

## 2019-01-05 DIAGNOSIS — J9621 Acute and chronic respiratory failure with hypoxia: Secondary | ICD-10-CM | POA: Diagnosis not present

## 2019-01-05 DIAGNOSIS — I119 Hypertensive heart disease without heart failure: Secondary | ICD-10-CM | POA: Diagnosis not present

## 2019-01-12 DIAGNOSIS — Z961 Presence of intraocular lens: Secondary | ICD-10-CM | POA: Diagnosis not present

## 2019-01-12 DIAGNOSIS — J441 Chronic obstructive pulmonary disease with (acute) exacerbation: Secondary | ICD-10-CM | POA: Diagnosis not present

## 2019-01-12 DIAGNOSIS — J9621 Acute and chronic respiratory failure with hypoxia: Secondary | ICD-10-CM | POA: Diagnosis not present

## 2019-01-12 DIAGNOSIS — F339 Major depressive disorder, recurrent, unspecified: Secondary | ICD-10-CM | POA: Diagnosis not present

## 2019-01-12 DIAGNOSIS — G9009 Other idiopathic peripheral autonomic neuropathy: Secondary | ICD-10-CM | POA: Diagnosis not present

## 2019-01-12 DIAGNOSIS — F0280 Dementia in other diseases classified elsewhere without behavioral disturbance: Secondary | ICD-10-CM | POA: Diagnosis not present

## 2019-01-12 DIAGNOSIS — M810 Age-related osteoporosis without current pathological fracture: Secondary | ICD-10-CM | POA: Diagnosis not present

## 2019-01-12 DIAGNOSIS — Z90710 Acquired absence of both cervix and uterus: Secondary | ICD-10-CM | POA: Diagnosis not present

## 2019-01-12 DIAGNOSIS — H9190 Unspecified hearing loss, unspecified ear: Secondary | ICD-10-CM | POA: Diagnosis not present

## 2019-01-12 DIAGNOSIS — M1990 Unspecified osteoarthritis, unspecified site: Secondary | ICD-10-CM | POA: Diagnosis not present

## 2019-01-12 DIAGNOSIS — B370 Candidal stomatitis: Secondary | ICD-10-CM | POA: Diagnosis not present

## 2019-01-12 DIAGNOSIS — F411 Generalized anxiety disorder: Secondary | ICD-10-CM | POA: Diagnosis not present

## 2019-01-12 DIAGNOSIS — Z9049 Acquired absence of other specified parts of digestive tract: Secondary | ICD-10-CM | POA: Diagnosis not present

## 2019-01-12 DIAGNOSIS — I119 Hypertensive heart disease without heart failure: Secondary | ICD-10-CM | POA: Diagnosis not present

## 2019-01-12 DIAGNOSIS — K219 Gastro-esophageal reflux disease without esophagitis: Secondary | ICD-10-CM | POA: Diagnosis not present

## 2019-01-12 DIAGNOSIS — Z9842 Cataract extraction status, left eye: Secondary | ICD-10-CM | POA: Diagnosis not present

## 2019-01-12 DIAGNOSIS — Z9841 Cataract extraction status, right eye: Secondary | ICD-10-CM | POA: Diagnosis not present

## 2019-01-12 DIAGNOSIS — G20 Parkinson's disease: Secondary | ICD-10-CM | POA: Diagnosis not present

## 2019-01-13 DIAGNOSIS — J9621 Acute and chronic respiratory failure with hypoxia: Secondary | ICD-10-CM | POA: Diagnosis not present

## 2019-01-13 DIAGNOSIS — G20 Parkinson's disease: Secondary | ICD-10-CM | POA: Diagnosis not present

## 2019-01-13 DIAGNOSIS — G9009 Other idiopathic peripheral autonomic neuropathy: Secondary | ICD-10-CM | POA: Diagnosis not present

## 2019-01-13 DIAGNOSIS — I119 Hypertensive heart disease without heart failure: Secondary | ICD-10-CM | POA: Diagnosis not present

## 2019-01-13 DIAGNOSIS — F0280 Dementia in other diseases classified elsewhere without behavioral disturbance: Secondary | ICD-10-CM | POA: Diagnosis not present

## 2019-01-13 DIAGNOSIS — J441 Chronic obstructive pulmonary disease with (acute) exacerbation: Secondary | ICD-10-CM | POA: Diagnosis not present

## 2019-01-16 ENCOUNTER — Telehealth: Payer: Self-pay | Admitting: *Deleted

## 2019-01-16 DIAGNOSIS — F0280 Dementia in other diseases classified elsewhere without behavioral disturbance: Secondary | ICD-10-CM | POA: Diagnosis not present

## 2019-01-16 DIAGNOSIS — J9621 Acute and chronic respiratory failure with hypoxia: Secondary | ICD-10-CM | POA: Diagnosis not present

## 2019-01-16 DIAGNOSIS — G20 Parkinson's disease: Secondary | ICD-10-CM | POA: Diagnosis not present

## 2019-01-16 DIAGNOSIS — G9009 Other idiopathic peripheral autonomic neuropathy: Secondary | ICD-10-CM | POA: Diagnosis not present

## 2019-01-16 DIAGNOSIS — I119 Hypertensive heart disease without heart failure: Secondary | ICD-10-CM | POA: Diagnosis not present

## 2019-01-16 DIAGNOSIS — J441 Chronic obstructive pulmonary disease with (acute) exacerbation: Secondary | ICD-10-CM | POA: Diagnosis not present

## 2019-01-16 NOTE — Telephone Encounter (Signed)
Order faxed to [facility], at [phone] pull-ups, incontinence liners, chux and life line, per Dr Melford Aase.

## 2019-01-18 ENCOUNTER — Telehealth: Payer: Self-pay | Admitting: Physician Assistant

## 2019-01-18 DIAGNOSIS — G20 Parkinson's disease: Secondary | ICD-10-CM | POA: Diagnosis not present

## 2019-01-18 DIAGNOSIS — J441 Chronic obstructive pulmonary disease with (acute) exacerbation: Secondary | ICD-10-CM | POA: Diagnosis not present

## 2019-01-18 DIAGNOSIS — G9009 Other idiopathic peripheral autonomic neuropathy: Secondary | ICD-10-CM | POA: Diagnosis not present

## 2019-01-18 DIAGNOSIS — J9621 Acute and chronic respiratory failure with hypoxia: Secondary | ICD-10-CM | POA: Diagnosis not present

## 2019-01-18 DIAGNOSIS — I119 Hypertensive heart disease without heart failure: Secondary | ICD-10-CM | POA: Diagnosis not present

## 2019-01-18 DIAGNOSIS — F0280 Dementia in other diseases classified elsewhere without behavioral disturbance: Secondary | ICD-10-CM | POA: Diagnosis not present

## 2019-01-18 NOTE — Telephone Encounter (Signed)
Will schedule hospital telephone visit

## 2019-01-18 NOTE — Telephone Encounter (Signed)
-----   Message from Elenor Quinones, [place] sent at 01/18/2019 11:36 AM EDT ----- Regarding: hospital follow up Contact: [phone] PER YELLOW OFFICE NOTE:   Pt reports   Patient was in hospital Feb 13-17.  Went straight to rehab until March 25 th  2020.  Has in home therapy that started Monday of this week.  Also has everyday assistance for her needs.   Would like a Phone visit for F-UP if possible   Please advise

## 2019-01-18 NOTE — Telephone Encounter (Signed)
Called patient on 01/18/2019 , 1:40 PM in an attempt to reach the patient for a hospital/re-hab  follow up.   Admit date: 12/01/18 Discharge: 12/05/18   She does not have any questions or concerns about medications from the hospital admission. The patient's medications were reviewed over the phone, they were counseled to bring in all current medications to the hospital follow up visit.   I advised the patient to call if any questions or concerns arise about the hospital admission or medications    Home health was started on 01/16/2019 after being discharged from re-hab. Facility on 01/11/2019.  All questions were answered and a follow up appointment was made.    **Spoke with patient's daughter Floyde Parkins) to schedule this follow-up. Prior to Admission medications   Medication Sig Start Date End Date Taking? Authorizing Provider  albuterol (PROVENTIL) (2.5 MG/3ML) 0.083% nebulizer solution Use 1 ampule in nebulizer 4 times a day or every 4 hours as needed to rescue asthma. Patient taking differently: Take 2.5 mg by nebulization See admin instructions. Use 1 ampule in nebulizer 4 times a day or every 4 hours as needed to rescue asthma. 04/04/18 04/04/19  Isaac Bliss, Rayford Halsted, MD  ALPRAZolam Duanne Moron) 0.5 MG tablet Take 1/2-1 tablet 2 - 3 x /day ONLY if needed for Anxiety Attack &  limit to 5 days /week to avoid addiction 12/05/18   Tat, Shanon Brow, MD  aspirin 81 MG tablet Take 81 mg by mouth daily. Buys OTC    [provider]  carbidopa-levodopa (SINEMET IR) 25-100 MG tablet Take 2 tablet at 9am and 2 tablets at 3pm. Patient taking differently: Take 2 tablets by mouth 2 (two) times daily. Take 2 tablet at 9am and 2 tablets at 3pm. 03/04/18   Narda Amber K, DO  Cholecalciferol (VITAMIN D) 2000 units CAPS Take 2 capsules (4,000 Units total) by mouth daily. Patient taking differently: Take 2,000 Units by mouth daily.  08/16/18   Liane Comber, NP  ENSURE (ENSURE) Take 237 mLs by mouth daily  as needed.     [provider]  furosemide (LASIX) 20 MG tablet Increase lasix for 40 mg (2 tabs) daily for 3-5 days until swelling resolves, then resume 20 mg (1 tab daily). Patient taking differently: Take 20 mg by mouth daily. Increase lasix for 40 mg (2 tabs) daily for 3-5 days until swelling resolves, then resume 20 mg (1 tab daily). 08/15/18   Liane Comber, NP  metoprolol tartrate (LOPRESSOR) 25 MG tablet Take 0.5 tablets (12.5 mg total) by mouth 2 (two) times daily. 12/05/18   Orson Eva, MD  omeprazole (PRILOSEC) 40 MG capsule Take 1 capsule (40 mg total) by mouth daily. 09/19/18   Vicie Mutters, PA-C  OXYGEN Inhale 2 L into the lungs daily as needed (for shortness of breath). Setting is on 2 when needed     [provider]  potassium chloride SA (K-DUR,KLOR-CON) 20 MEQ tablet Take 1 tablet by mouth daily. 09/19/18   Vicie Mutters, PA-C  predniSONE (DELTASONE) 10 MG tablet Take 6 tablets (60 mg total) by mouth daily with breakfast. And decrease by one tablet every 2 days 12/06/18   Tat, Shanon Brow, MD  sertraline (ZOLOFT) 25 MG tablet Take 1 tablet (25 mg total) by mouth daily. 09/19/18   Vicie Mutters, PA-C  TRELEGY ELLIPTA 100-62.5-25 MCG/INH AEPB INHALE 1 PUFF ONCE DAILY 05/04/18   [provider]

## 2019-01-18 NOTE — Telephone Encounter (Signed)
Patient has follow up office visit. Knows to call with any questions or concerns.   

## 2019-01-19 DIAGNOSIS — J9621 Acute and chronic respiratory failure with hypoxia: Secondary | ICD-10-CM | POA: Diagnosis not present

## 2019-01-19 DIAGNOSIS — F0280 Dementia in other diseases classified elsewhere without behavioral disturbance: Secondary | ICD-10-CM | POA: Diagnosis not present

## 2019-01-19 DIAGNOSIS — J441 Chronic obstructive pulmonary disease with (acute) exacerbation: Secondary | ICD-10-CM | POA: Diagnosis not present

## 2019-01-19 DIAGNOSIS — I119 Hypertensive heart disease without heart failure: Secondary | ICD-10-CM | POA: Diagnosis not present

## 2019-01-19 DIAGNOSIS — G20 Parkinson's disease: Secondary | ICD-10-CM | POA: Diagnosis not present

## 2019-01-19 DIAGNOSIS — G9009 Other idiopathic peripheral autonomic neuropathy: Secondary | ICD-10-CM | POA: Diagnosis not present

## 2019-01-20 ENCOUNTER — Telehealth: Payer: Medicare Other | Admitting: Adult Health Nurse Practitioner

## 2019-01-20 ENCOUNTER — Encounter: Payer: Self-pay | Admitting: Adult Health Nurse Practitioner

## 2019-01-20 DIAGNOSIS — R5383 Other fatigue: Secondary | ICD-10-CM

## 2019-01-20 DIAGNOSIS — N3000 Acute cystitis without hematuria: Secondary | ICD-10-CM | POA: Diagnosis not present

## 2019-01-20 MED ORDER — CIPROFLOXACIN HCL 250 MG PO TABS: 250.0000 mg | ORAL_TABLET | Freq: Two times a day (BID) | ORAL | 0 refills | Status: DC

## 2019-01-20 NOTE — Progress Notes (Signed)
Virtual Visit via Telephone Note  I connected with ASIA DUSENBURY' caregivers on 01/20/19 at  by telephone and verified that I am speaking with the correct person using two identifiers.  Her husband was out running errands and CNA present to care for her.   I discussed the limitations, risks, security and privacy concerns of performing an evaluation and management service by telephone and the availability of in person appointments. I also discussed with the patient that there may be a patient responsible charge related to this service. The patient expressed understanding and agreed to proceed.   History of Present Illness: Received a call from [place] voicing concern for Ms. Prajapati related to her physical decline in just one day.  She was increasingly lethargic and not able to complete ambulating as she had in days prior. Spoke with [place] who is at the home with her.  She was able to obtain vitals from patient with home equiptment.  She endorses concentrated urine dark yellow in color with an odor.  Also she has been increasingly lethargic.  She has been encouraging patient to increase water intake.  She is continent and incontinent of bladder and bowel.   Observations/Objective: Telephone call, patient was not able to speak with writer on the phone as she was sleeping.  B/P 139/79, Pulse 80, O2 99% There was not a thermometer available to check temperature. Discussed this with CNA would be helpful to have in the home.  Assessment and Plan: Diagnoses and all orders for this visit:  Acute cystitis without hematuria Increase water intake -     ciprofloxacin (CIPRO) 250 MG tablet; Take 1 tablet (250 mg total) by mouth 2 (two) times daily.  Lethargy Discussed monitoring cognition while with patient Discussed S&S to contact provider and when to see immediate attention   Follow Up Instructions:    I discussed the assessment and treatment plan with the  patient. The patient was provided an opportunity to ask questions and all were answered. The patient agreed with the plan and demonstrated an understanding of the instructions.   The patient/caregiver was advised to call back or seek an in-person evaluation if the symptoms worsen or if the condition fails to improve as anticipated.  I provided 15 minutes of non-face-to-face time during this encounter.  Garnet Sierras, NP [facility] 01/20/2019  12:15 PM

## 2019-01-23 ENCOUNTER — Ambulatory Visit: Payer: Medicare Other | Admitting: Neurology

## 2019-01-23 DIAGNOSIS — J9621 Acute and chronic respiratory failure with hypoxia: Secondary | ICD-10-CM | POA: Diagnosis not present

## 2019-01-23 DIAGNOSIS — J441 Chronic obstructive pulmonary disease with (acute) exacerbation: Secondary | ICD-10-CM | POA: Diagnosis not present

## 2019-01-23 DIAGNOSIS — I119 Hypertensive heart disease without heart failure: Secondary | ICD-10-CM | POA: Diagnosis not present

## 2019-01-23 DIAGNOSIS — G20 Parkinson's disease: Secondary | ICD-10-CM | POA: Diagnosis not present

## 2019-01-23 DIAGNOSIS — F0280 Dementia in other diseases classified elsewhere without behavioral disturbance: Secondary | ICD-10-CM | POA: Diagnosis not present

## 2019-01-23 DIAGNOSIS — G9009 Other idiopathic peripheral autonomic neuropathy: Secondary | ICD-10-CM | POA: Diagnosis not present

## 2019-01-23 NOTE — Patient Instructions (Signed)
Urinary Tract Infection, Adult A urinary tract infection (UTI) is an infection of any part of the urinary tract. The urinary tract includes:  The kidneys.  The ureters.  The bladder.  The urethra. These organs make, store, and get rid of pee (urine) in the body. What are the causes? This is caused by germs (bacteria) in your genital area. These germs grow and cause swelling (inflammation) of your urinary tract. What increases the risk? You are more likely to develop this condition if:  You have a small, thin tube (catheter) to drain pee.  You cannot control when you pee or poop (incontinence).  You are female, and: ? You use these methods to prevent pregnancy: ? A medicine that kills sperm (spermicide). ? A device that blocks sperm (diaphragm). ? You have low levels of a female hormone (estrogen). ? You are pregnant.  You have genes that add to your risk.  You are sexually active.  You take antibiotic medicines.  You have trouble peeing because of: ? A prostate that is bigger than normal, if you are female. ? A blockage in the part of your body that drains pee from the bladder (urethra). ? A kidney stone. ? A nerve condition that affects your bladder (neurogenic bladder). ? Not getting enough to drink. ? Not peeing often enough.  You have other conditions, such as: ? Diabetes. ? A weak disease-fighting system (immune system). ? Sickle cell disease. ? Gout. ? Injury of the spine. What are the signs or symptoms? Symptoms of this condition include:  Needing to pee right away (urgently).  Peeing often.  Peeing small amounts often.  Pain or burning when peeing.  Blood in the pee.  Pee that smells bad or not like normal.  Trouble peeing.  Pee that is cloudy.  Fluid coming from the vagina, if you are female.  Pain in the belly or lower back. Other symptoms include:  Throwing up (vomiting).  No urge to eat.  Feeling mixed up (confused).  Being tired  and grouchy (irritable).  A fever.  Watery poop (diarrhea). How is this treated? This condition may be treated with:  Antibiotic medicine.  Other medicines.  Drinking enough water. Follow these instructions at home:  Medicines  Take over-the-counter and prescription medicines only as told by your doctor.  If you were prescribed an antibiotic medicine, take it as told by your doctor. Do not stop taking it even if you start to feel better. General instructions  Make sure you: ? Pee until your bladder is empty. ? Do not hold pee for a long time. ? Empty your bladder after sex. ? Wipe from front to back after pooping if you are a female. Use each tissue one time when you wipe.  Drink enough fluid to keep your pee pale yellow.  Keep all follow-up visits as told by your doctor. This is important. Contact a doctor if:  You do not get better after 1-2 days.  Your symptoms go away and then come back. Get help right away if:  You have very bad back pain.  You have very bad pain in your lower belly.  You have a fever.  You are sick to your stomach (nauseous).  You are throwing up. Summary  A urinary tract infection (UTI) is an infection of any part of the urinary tract.  This condition is caused by germs in your genital area.  There are many risk factors for a UTI. These include having a small, thin   tube to drain pee and not being able to control when you pee or poop.  Treatment includes antibiotic medicines for germs.  Drink enough fluid to keep your pee pale yellow. This information is not intended to replace advice given to you by your health care provider. Make sure you discuss any questions you have with your health care provider. Document Released: 03/23/2008 Document Revised: 04/14/2018 Document Reviewed: 04/14/2018 Elsevier Interactive Patient Education  2019 [employer]  

## 2019-01-24 ENCOUNTER — Ambulatory Visit: Payer: Medicare Other | Admitting: Adult Health Nurse Practitioner

## 2019-01-24 ENCOUNTER — Encounter: Payer: Self-pay | Admitting: Adult Health Nurse Practitioner

## 2019-01-24 ENCOUNTER — Other Ambulatory Visit: Payer: Self-pay

## 2019-01-24 VITALS — BP 146/76 | HR 70 | Wt 151.0 lb

## 2019-01-24 DIAGNOSIS — E876 Hypokalemia: Secondary | ICD-10-CM

## 2019-01-24 DIAGNOSIS — J441 Chronic obstructive pulmonary disease with (acute) exacerbation: Secondary | ICD-10-CM | POA: Diagnosis not present

## 2019-01-24 DIAGNOSIS — G232 Striatonigral degeneration: Secondary | ICD-10-CM | POA: Diagnosis not present

## 2019-01-24 DIAGNOSIS — E43 Unspecified severe protein-calorie malnutrition: Secondary | ICD-10-CM | POA: Diagnosis not present

## 2019-01-24 DIAGNOSIS — L98429 Non-pressure chronic ulcer of back with unspecified severity: Secondary | ICD-10-CM | POA: Diagnosis not present

## 2019-01-24 DIAGNOSIS — N179 Acute kidney failure, unspecified: Secondary | ICD-10-CM

## 2019-01-24 DIAGNOSIS — R7302 Impaired glucose tolerance (oral): Secondary | ICD-10-CM | POA: Diagnosis not present

## 2019-01-24 DIAGNOSIS — R6510 Systemic inflammatory response syndrome (SIRS) of non-infectious origin without acute organ dysfunction: Secondary | ICD-10-CM | POA: Diagnosis not present

## 2019-01-24 DIAGNOSIS — F419 Anxiety disorder, unspecified: Secondary | ICD-10-CM | POA: Diagnosis not present

## 2019-01-24 DIAGNOSIS — J9621 Acute and chronic respiratory failure with hypoxia: Secondary | ICD-10-CM

## 2019-01-24 DIAGNOSIS — R600 Localized edema: Secondary | ICD-10-CM | POA: Diagnosis not present

## 2019-01-24 DIAGNOSIS — G20C Parkinsonism, unspecified: Secondary | ICD-10-CM

## 2019-01-24 NOTE — Progress Notes (Signed)
Virtual Visit via Telephone Note  I connected with Marie [name] on 01/25/19 at 11:30 AM EDT by telephone and verified that I am speaking with the correct person using two identifiers.   I discussed the limitations, risks, security and privacy concerns of performing an evaluation and management service by telephone and the availability of in person appointments. I also discussed with the patient that there may be a patient responsible charge related to this service. The patient expressed understanding and agreed to proceed.    Hospital follow up   Assessment and Plan: Hospital visit 12/01/18-12/05/18 and them rehabilitation and discharged from this facility on 01/11/2019.  Today's follow up is for transition of care.   Acute on chronic respiratory failure with hypoxia(HCC) -Secondary COPD exacerbation which is likely incited from respiratory infection Improved, continued chronic cough  COPD exacerbation ([place]) Continue Trelegy daily Reminded to rinse mouth after each use and spit out -Continueduo nebs PRN  Fever/SIRS ([place]) Blood cultures negative Does not have thermometer at home Encouraged having this on hand.  Acute kidney injury ([place]) -Baseline creatinine 0.8-1.0 -Presented with serum creatinine of 1.46 Furosemide restarted 40mg , half tablet daily. Needs to increase water intake   Impaired glucose tolerance  -11/16/2018 hemoglobin A1c 6.4 Will continue to monitor  Hypokalemia Improved Will continue to monitor  Anxiety/depression Doing well at this time Continue home dose alprazolam and Zoloft  Parkinson's plus syndrome/PSP ([place]) Continue home dose of Sinemet Home health to help with ambulation with walker and stability.  Stage 2 sacral wound (HCC) Assist with repositioning -continue local wound care  Severe malnutrition (HCC) -supplements, Ensure Encourage between meals, not as meal replacement  Bilateral lower extremity edema Increase  Furosemide 40mg  daily for 3-5 days then resume half tablet 20mg  daily. TED stockings apply daily in morning and remove at night Increase water intake Elevate lower extremities above heart when sitting.      Follow Up Instructions:  I discussed the assessment and treatment plan with the patient/caregiver. The patient was provided an opportunity to ask questions and all were answered. The patient agreed with the plan and demonstrated an understanding of the instructions.  Discussed goal to avoid readmission related to this diagnosis.   The patient was advised to call back or seek an in-person evaluation if the symptoms worsen or if the condition fails to improve as anticipated.   I provided 20 minutes of non-face-to-face time during this encounter for counseling. Total of 65min chart review, and complex, high/moderate level critical decision making was performed this visit.    Future Appointments  Date Time Provider [place]  03/03/2019 10:45 AM [name], MD [facility] None  04/07/2019 10:50 AM Marie Amber K, DO [facility] None  06/06/2019  2:00 PM [name], MD [facility] None  11/20/2019 11:15 AM Vicie Mutters, PA-C [facility] None     HPI 83 y.o.female presents for follow up for transition from recent hospitalization. Admit date to the hospital was 12/01/18, patient was discharged from the hospital on 12/05/18.  She had [facility], [facility], stay with discharge on 01/11/2019. and our clinical staff contacted the office the day after discharge to set up a follow up appointment. The discharge summary, medications, and diagnostic test results were reviewed before meeting with the patient. The patient was admitted for:   Discharge Diagnoses:  Acute on chronic respiratory failure with hypoxia COPD exacerbation Fever/SIRS Acute kidney injury SVT/Atrial tachycardia Impaired glucose tolerance  Hypokalemia Anxiety/depression Parkinson's plus syndrome/PSP Stage 2 sacral  wound Severe malnutrition -Bilateral  lower extremity edema  Home health is involved and began services 01/16/2019.  Do not have records from this, will review once received.  Images while in the hospital: Dg Chest 2 View  Result Date: 12/01/2018 CLINICAL DATA:  Cough Pt brought in by EMS due to cough and weakness. Reports she has had a dry cough since Monday and has progressively gotten weak. Denies pain EXAM: CHEST - 2 VIEW COMPARISON:  04/03/2018 FINDINGS: Cardiac silhouette is top-normal in size. No mediastinal or hilar masses. There is no evidence of adenopathy. There are prominent bronchovascular markings bilaterally with mild interstitial thickening most evident in the lower lungs. No lung consolidation. Chronic bronchitic changes are noted in the posterior lower lobes. No pleural effusion or pneumothorax. Skeletal structures are intact. IMPRESSION: No active cardiopulmonary disease. Electronically Signed   By: Marie [name] M.D.   On: 12/01/2018 10:27     Current Outpatient Medications (Endocrine & Metabolic):  .  predniSONE (DELTASONE) 10 MG tablet, Take 6 tablets (60 mg total) by mouth daily with breakfast. And decrease by one tablet every 2 days  Current Outpatient Medications (Cardiovascular):  .  furosemide (LASIX) 20 MG tablet, Increase lasix for 40 mg (2 tabs) daily for 3-5 days until swelling resolves, then resume 20 mg (1 tab daily). (Patient taking differently: Take 20 mg by mouth daily. Increase lasix for 40 mg (2 tabs) daily for 3-5 days until swelling resolves, then resume 20 mg (1 tab daily).) .  metoprolol tartrate (LOPRESSOR) 25 MG tablet, Take 0.5 tablets (12.5 mg total) by mouth 2 (two) times daily.  Current Outpatient Medications (Respiratory):  .  albuterol (PROVENTIL) (2.5 MG/3ML) 0.083% nebulizer solution, Use 1 ampule in nebulizer 4 times a day or every 4 hours as needed to rescue asthma. (Patient taking differently: Take 2.5 mg by nebulization See admin  instructions. Use 1 ampule in nebulizer 4 times a day or every 4 hours as needed to rescue asthma.) .  TRELEGY ELLIPTA 100-62.5-25 MCG/INH AEPB, as needed.   Current Outpatient Medications (Analgesics):  .  aspirin 81 MG tablet, Take 81 mg by mouth daily. Buys OTC   Current Outpatient Medications (Other):  Marland Kitchen  ALPRAZolam (XANAX) 0.5 MG tablet, Take 1/2-1 tablet 2 - 3 x /day ONLY if needed for Anxiety Attack &  limit to 5 days /week to avoid addiction .  carbidopa-levodopa (SINEMET IR) 25-100 MG tablet, Take 2 tablet at 9am and 2 tablets at 3pm. (Patient taking differently: Take 2 tablets by mouth 2 (two) times daily. Take 2 tablet at 9am and 2 tablets at 3pm.) .  Cholecalciferol (VITAMIN D) 2000 units CAPS, Take 2 capsules (4,000 Units total) by mouth daily. (Patient taking differently: Take 2,000 Units by mouth daily. ) .  ciprofloxacin (CIPRO) 250 MG tablet, Take 1 tablet (250 mg total) by mouth 2 (two) times daily. Marland Kitchen  ENSURE (ENSURE), Take 237 mLs by mouth daily as needed.  Marland Kitchen  omeprazole (PRILOSEC) 40 MG capsule, Take 1 capsule (40 mg total) by mouth daily. .  OXYGEN, Inhale 2 L into the lungs daily as needed (for shortness of breath). Setting is on 2 when needed  .  potassium chloride SA ([name]-DUR,KLOR-CON) 20 MEQ tablet, Take 1 tablet by mouth daily. .  sertraline (ZOLOFT) 25 MG tablet, Take 1 tablet (25 mg total) by mouth daily.  Past Medical History:  Diagnosis Date  . Balance disorder 2008.     Falls a lot.  She can be standing and then leans too far  over to one side   . Bulging disc   . Chronic bronchitis ([place])    due to congestion at times, on prednisone and advair  . COPD (chronic obstructive pulmonary disease) ([place])   . COPD with acute exacerbation ([place]) 12/15/2017  . Depression    many years ago  . GERD (gastroesophageal reflux disease)   . Hearing loss    mild  . Hyperlipidemia   . Hypertension   . Iatrogenic adrenal insufficiency ([place])   . Incontinence    not indicated  at this visit.  Marland Kitchen LVH (left ventricular hypertrophy) due to hypertensive disease    a. 02/2017: echo showing an EF of 65-70% with moderate LVH, hyperdynamic LV with subvalvular gradient of 70 mmHg, SAM, and mild MR  . Neuropathy    legs stay numb   . Osteoarthritis    hands,   . Osteoporosis   . Shortness of breath dyspnea   . Tubulovillous adenoma of rectum   . Wears dentures   . Wears glasses      Allergies  Allergen Reactions  . Demerol  [Meperidine Hcl] Other (See Comments)    Pt. Feels like she is suffocating  . Ertapenem Hives    Caused whole body to turn red Other reaction(s): Redness Caused whole body to turn red  . Codeine Other (See Comments)    hallucinations  . Demerol Other (See Comments)    hallucinations  . Morphine And Related Other (See Comments)    hallucinations  . Other Other (See Comments)    Invan 7- pt became red all over  . Sulfate Other (See Comments)    unknown  . Sulfa Antibiotics Rash    ROS: all negative except above.   Physical Exam: Taken by CNA in home. Filed Weights   01/24/19 1147  Weight: 151 lb (68.5 kg)   BP (!) 146/76   Pulse 70   Wt 151 lb (68.5 kg)   SpO2 99%   BMI 25.92 kg/m  Observations: Patient was able to carry on conversation.  Repsonses are delayed. Some coughing noted during conversation.  Denies any shortness of breath, no audible wheezing. She denies any health concerns.  Reports she is feeling better since she started ciprofloxacin for UTI last week and has little more strength.  CNA, Phineas Real, Conservation officer, nature with patient at time of telephone call.  Her husband nor daughter were present for this.  Caregiver was able to describe bilateral lower extremities and full and shiny.  With palpation, Over 10 second for skin to recoil, +3 pitting edema. Patient did not reports discomfort with this. Caregiver assist with transfers to use bathroom.  Reports patient is continent and incontinent of bowel & bladder.  Sacral area is  covered at this time. Caregiver took noted of our conversation to relay to family.  Will also send AVS via MyChart.   Garnet Sierras, NP 12:37 PM [facility]

## 2019-01-25 ENCOUNTER — Encounter: Payer: Self-pay | Admitting: Adult Health Nurse Practitioner

## 2019-01-25 DIAGNOSIS — G20 Parkinson's disease: Secondary | ICD-10-CM | POA: Diagnosis not present

## 2019-01-25 DIAGNOSIS — F0280 Dementia in other diseases classified elsewhere without behavioral disturbance: Secondary | ICD-10-CM | POA: Diagnosis not present

## 2019-01-25 DIAGNOSIS — I119 Hypertensive heart disease without heart failure: Secondary | ICD-10-CM | POA: Diagnosis not present

## 2019-01-25 DIAGNOSIS — J441 Chronic obstructive pulmonary disease with (acute) exacerbation: Secondary | ICD-10-CM | POA: Diagnosis not present

## 2019-01-25 DIAGNOSIS — G9009 Other idiopathic peripheral autonomic neuropathy: Secondary | ICD-10-CM | POA: Diagnosis not present

## 2019-01-25 DIAGNOSIS — J9621 Acute and chronic respiratory failure with hypoxia: Secondary | ICD-10-CM | POA: Diagnosis not present

## 2019-01-25 NOTE — Patient Instructions (Signed)
Tuesday 01/24/19 you had a telephone visit with Garnet Sierras, DNP.  Below is a summary of your visit.   Trelelgy inhaler should be used once a day in the morning.  After use please rinse your mouth out with water and spit this out.  This will prevent thrush from forming in your mouth, thick white coating.  The inhaler is long acting, no immediate relief from use.  This keeps your airways open so you can breathing easier.   Should you have any wheezing or shortness of breath, this is when you should use the Nebulizer. This will provide more rapid relief.   For the swelling in both legs.  We have increased Furosemide (Lasix) to 40mg  daily in the morning for the next 3-5 days to reduce this swelling.  After this return to 20mg  every day. Checking on Home Health drawing labs next week.  Compression stockings (TED hose) will help to decrease the swelling.  If you do not have a pair, please obtain a pair.  Knee high or thigh high.  Please have someone help you to put this on in the morning and remove them at night.  During the day do not sit upright in chair all day.  Legs should be elevated above your heart when resting.  Increase water intake.  Add flavoring to water this will prevent furutre urinary tract infections and also keep your kidneys filtering out toxins as well as help with swelling in your legs.  It is VERY important!!!   We want to keep you out of the hospital and skilled nursing!  Please contact the office with any questions or concerns. These instructions were given to patient as well as caregiver in the home.  Sincerely,         Garnet Sierras, DNP

## 2019-01-30 ENCOUNTER — Other Ambulatory Visit: Payer: Self-pay

## 2019-01-30 DIAGNOSIS — I119 Hypertensive heart disease without heart failure: Secondary | ICD-10-CM | POA: Diagnosis not present

## 2019-01-30 DIAGNOSIS — G20 Parkinson's disease: Secondary | ICD-10-CM | POA: Diagnosis not present

## 2019-01-30 DIAGNOSIS — J441 Chronic obstructive pulmonary disease with (acute) exacerbation: Secondary | ICD-10-CM | POA: Diagnosis not present

## 2019-01-30 DIAGNOSIS — Z79899 Other long term (current) drug therapy: Secondary | ICD-10-CM

## 2019-01-30 DIAGNOSIS — G9009 Other idiopathic peripheral autonomic neuropathy: Secondary | ICD-10-CM | POA: Diagnosis not present

## 2019-01-30 DIAGNOSIS — J9621 Acute and chronic respiratory failure with hypoxia: Secondary | ICD-10-CM | POA: Diagnosis not present

## 2019-01-30 DIAGNOSIS — F0280 Dementia in other diseases classified elsewhere without behavioral disturbance: Secondary | ICD-10-CM | POA: Diagnosis not present

## 2019-01-31 DIAGNOSIS — J9621 Acute and chronic respiratory failure with hypoxia: Secondary | ICD-10-CM | POA: Diagnosis not present

## 2019-01-31 DIAGNOSIS — F0280 Dementia in other diseases classified elsewhere without behavioral disturbance: Secondary | ICD-10-CM | POA: Diagnosis not present

## 2019-01-31 DIAGNOSIS — G20 Parkinson's disease: Secondary | ICD-10-CM | POA: Diagnosis not present

## 2019-01-31 DIAGNOSIS — J441 Chronic obstructive pulmonary disease with (acute) exacerbation: Secondary | ICD-10-CM | POA: Diagnosis not present

## 2019-01-31 DIAGNOSIS — I119 Hypertensive heart disease without heart failure: Secondary | ICD-10-CM | POA: Diagnosis not present

## 2019-01-31 DIAGNOSIS — G9009 Other idiopathic peripheral autonomic neuropathy: Secondary | ICD-10-CM | POA: Diagnosis not present

## 2019-02-01 DIAGNOSIS — G20 Parkinson's disease: Secondary | ICD-10-CM | POA: Diagnosis not present

## 2019-02-01 DIAGNOSIS — J9621 Acute and chronic respiratory failure with hypoxia: Secondary | ICD-10-CM | POA: Diagnosis not present

## 2019-02-01 DIAGNOSIS — G9009 Other idiopathic peripheral autonomic neuropathy: Secondary | ICD-10-CM | POA: Diagnosis not present

## 2019-02-01 DIAGNOSIS — J441 Chronic obstructive pulmonary disease with (acute) exacerbation: Secondary | ICD-10-CM | POA: Diagnosis not present

## 2019-02-01 DIAGNOSIS — F0280 Dementia in other diseases classified elsewhere without behavioral disturbance: Secondary | ICD-10-CM | POA: Diagnosis not present

## 2019-02-01 DIAGNOSIS — I119 Hypertensive heart disease without heart failure: Secondary | ICD-10-CM | POA: Diagnosis not present

## 2019-02-03 DIAGNOSIS — J441 Chronic obstructive pulmonary disease with (acute) exacerbation: Secondary | ICD-10-CM | POA: Diagnosis not present

## 2019-02-03 DIAGNOSIS — G20 Parkinson's disease: Secondary | ICD-10-CM | POA: Diagnosis not present

## 2019-02-03 DIAGNOSIS — F0280 Dementia in other diseases classified elsewhere without behavioral disturbance: Secondary | ICD-10-CM | POA: Diagnosis not present

## 2019-02-03 DIAGNOSIS — G9009 Other idiopathic peripheral autonomic neuropathy: Secondary | ICD-10-CM | POA: Diagnosis not present

## 2019-02-03 DIAGNOSIS — I119 Hypertensive heart disease without heart failure: Secondary | ICD-10-CM | POA: Diagnosis not present

## 2019-02-03 DIAGNOSIS — J9621 Acute and chronic respiratory failure with hypoxia: Secondary | ICD-10-CM | POA: Diagnosis not present

## 2019-02-06 DIAGNOSIS — F0280 Dementia in other diseases classified elsewhere without behavioral disturbance: Secondary | ICD-10-CM | POA: Diagnosis not present

## 2019-02-06 DIAGNOSIS — G20 Parkinson's disease: Secondary | ICD-10-CM | POA: Diagnosis not present

## 2019-02-06 DIAGNOSIS — I119 Hypertensive heart disease without heart failure: Secondary | ICD-10-CM | POA: Diagnosis not present

## 2019-02-06 DIAGNOSIS — G9009 Other idiopathic peripheral autonomic neuropathy: Secondary | ICD-10-CM | POA: Diagnosis not present

## 2019-02-06 DIAGNOSIS — J9621 Acute and chronic respiratory failure with hypoxia: Secondary | ICD-10-CM | POA: Diagnosis not present

## 2019-02-06 DIAGNOSIS — J441 Chronic obstructive pulmonary disease with (acute) exacerbation: Secondary | ICD-10-CM | POA: Diagnosis not present

## 2019-02-07 DIAGNOSIS — J441 Chronic obstructive pulmonary disease with (acute) exacerbation: Secondary | ICD-10-CM | POA: Diagnosis not present

## 2019-02-07 DIAGNOSIS — J9621 Acute and chronic respiratory failure with hypoxia: Secondary | ICD-10-CM | POA: Diagnosis not present

## 2019-02-07 DIAGNOSIS — G20 Parkinson's disease: Secondary | ICD-10-CM | POA: Diagnosis not present

## 2019-02-07 DIAGNOSIS — F0280 Dementia in other diseases classified elsewhere without behavioral disturbance: Secondary | ICD-10-CM | POA: Diagnosis not present

## 2019-02-07 DIAGNOSIS — I119 Hypertensive heart disease without heart failure: Secondary | ICD-10-CM | POA: Diagnosis not present

## 2019-02-07 DIAGNOSIS — G9009 Other idiopathic peripheral autonomic neuropathy: Secondary | ICD-10-CM | POA: Diagnosis not present

## 2019-02-08 DIAGNOSIS — G9009 Other idiopathic peripheral autonomic neuropathy: Secondary | ICD-10-CM | POA: Diagnosis not present

## 2019-02-08 DIAGNOSIS — J9621 Acute and chronic respiratory failure with hypoxia: Secondary | ICD-10-CM | POA: Diagnosis not present

## 2019-02-08 DIAGNOSIS — F0280 Dementia in other diseases classified elsewhere without behavioral disturbance: Secondary | ICD-10-CM | POA: Diagnosis not present

## 2019-02-08 DIAGNOSIS — J441 Chronic obstructive pulmonary disease with (acute) exacerbation: Secondary | ICD-10-CM | POA: Diagnosis not present

## 2019-02-08 DIAGNOSIS — G20 Parkinson's disease: Secondary | ICD-10-CM | POA: Diagnosis not present

## 2019-02-08 DIAGNOSIS — I119 Hypertensive heart disease without heart failure: Secondary | ICD-10-CM | POA: Diagnosis not present

## 2019-02-09 ENCOUNTER — Telehealth: Payer: Self-pay

## 2019-02-09 NOTE — Telephone Encounter (Signed)
Patient's husband has been aware of lab results & results have been put in folder for scanning. April 23rd 2020 at 12:05pm DD

## 2019-02-11 DIAGNOSIS — G20 Parkinson's disease: Secondary | ICD-10-CM | POA: Diagnosis not present

## 2019-02-11 DIAGNOSIS — J9621 Acute and chronic respiratory failure with hypoxia: Secondary | ICD-10-CM | POA: Diagnosis not present

## 2019-02-11 DIAGNOSIS — K219 Gastro-esophageal reflux disease without esophagitis: Secondary | ICD-10-CM | POA: Diagnosis not present

## 2019-02-11 DIAGNOSIS — J441 Chronic obstructive pulmonary disease with (acute) exacerbation: Secondary | ICD-10-CM | POA: Diagnosis not present

## 2019-02-11 DIAGNOSIS — Z961 Presence of intraocular lens: Secondary | ICD-10-CM | POA: Diagnosis not present

## 2019-02-11 DIAGNOSIS — Z9842 Cataract extraction status, left eye: Secondary | ICD-10-CM | POA: Diagnosis not present

## 2019-02-11 DIAGNOSIS — M1990 Unspecified osteoarthritis, unspecified site: Secondary | ICD-10-CM | POA: Diagnosis not present

## 2019-02-11 DIAGNOSIS — I119 Hypertensive heart disease without heart failure: Secondary | ICD-10-CM | POA: Diagnosis not present

## 2019-02-11 DIAGNOSIS — F411 Generalized anxiety disorder: Secondary | ICD-10-CM | POA: Diagnosis not present

## 2019-02-11 DIAGNOSIS — G9009 Other idiopathic peripheral autonomic neuropathy: Secondary | ICD-10-CM | POA: Diagnosis not present

## 2019-02-11 DIAGNOSIS — M810 Age-related osteoporosis without current pathological fracture: Secondary | ICD-10-CM | POA: Diagnosis not present

## 2019-02-11 DIAGNOSIS — F0280 Dementia in other diseases classified elsewhere without behavioral disturbance: Secondary | ICD-10-CM | POA: Diagnosis not present

## 2019-02-11 DIAGNOSIS — F339 Major depressive disorder, recurrent, unspecified: Secondary | ICD-10-CM | POA: Diagnosis not present

## 2019-02-11 DIAGNOSIS — H9190 Unspecified hearing loss, unspecified ear: Secondary | ICD-10-CM | POA: Diagnosis not present

## 2019-02-11 DIAGNOSIS — B370 Candidal stomatitis: Secondary | ICD-10-CM | POA: Diagnosis not present

## 2019-02-11 DIAGNOSIS — Z90710 Acquired absence of both cervix and uterus: Secondary | ICD-10-CM | POA: Diagnosis not present

## 2019-02-11 DIAGNOSIS — Z9841 Cataract extraction status, right eye: Secondary | ICD-10-CM | POA: Diagnosis not present

## 2019-02-11 DIAGNOSIS — Z9049 Acquired absence of other specified parts of digestive tract: Secondary | ICD-10-CM | POA: Diagnosis not present

## 2019-02-13 DIAGNOSIS — G20 Parkinson's disease: Secondary | ICD-10-CM | POA: Diagnosis not present

## 2019-02-13 DIAGNOSIS — F0280 Dementia in other diseases classified elsewhere without behavioral disturbance: Secondary | ICD-10-CM | POA: Diagnosis not present

## 2019-02-13 DIAGNOSIS — G9009 Other idiopathic peripheral autonomic neuropathy: Secondary | ICD-10-CM | POA: Diagnosis not present

## 2019-02-13 DIAGNOSIS — J441 Chronic obstructive pulmonary disease with (acute) exacerbation: Secondary | ICD-10-CM | POA: Diagnosis not present

## 2019-02-13 DIAGNOSIS — J9621 Acute and chronic respiratory failure with hypoxia: Secondary | ICD-10-CM | POA: Diagnosis not present

## 2019-02-13 DIAGNOSIS — I119 Hypertensive heart disease without heart failure: Secondary | ICD-10-CM | POA: Diagnosis not present

## 2019-02-14 ENCOUNTER — Encounter: Payer: Self-pay | Admitting: Internal Medicine

## 2019-02-14 DIAGNOSIS — J441 Chronic obstructive pulmonary disease with (acute) exacerbation: Secondary | ICD-10-CM | POA: Diagnosis not present

## 2019-02-14 DIAGNOSIS — G20 Parkinson's disease: Secondary | ICD-10-CM | POA: Diagnosis not present

## 2019-02-14 DIAGNOSIS — F0280 Dementia in other diseases classified elsewhere without behavioral disturbance: Secondary | ICD-10-CM | POA: Diagnosis not present

## 2019-02-14 DIAGNOSIS — G9009 Other idiopathic peripheral autonomic neuropathy: Secondary | ICD-10-CM | POA: Diagnosis not present

## 2019-02-14 DIAGNOSIS — I119 Hypertensive heart disease without heart failure: Secondary | ICD-10-CM | POA: Diagnosis not present

## 2019-02-14 DIAGNOSIS — J9621 Acute and chronic respiratory failure with hypoxia: Secondary | ICD-10-CM | POA: Diagnosis not present

## 2019-02-15 DIAGNOSIS — J301 Allergic rhinitis due to pollen: Secondary | ICD-10-CM | POA: Diagnosis not present

## 2019-02-15 DIAGNOSIS — J9611 Chronic respiratory failure with hypoxia: Secondary | ICD-10-CM | POA: Diagnosis not present

## 2019-02-15 DIAGNOSIS — J449 Chronic obstructive pulmonary disease, unspecified: Secondary | ICD-10-CM | POA: Diagnosis not present

## 2019-02-15 DIAGNOSIS — G20 Parkinson's disease: Secondary | ICD-10-CM | POA: Diagnosis not present

## 2019-02-16 DIAGNOSIS — J9621 Acute and chronic respiratory failure with hypoxia: Secondary | ICD-10-CM | POA: Diagnosis not present

## 2019-02-16 DIAGNOSIS — F0280 Dementia in other diseases classified elsewhere without behavioral disturbance: Secondary | ICD-10-CM | POA: Diagnosis not present

## 2019-02-16 DIAGNOSIS — J441 Chronic obstructive pulmonary disease with (acute) exacerbation: Secondary | ICD-10-CM | POA: Diagnosis not present

## 2019-02-16 DIAGNOSIS — G20 Parkinson's disease: Secondary | ICD-10-CM | POA: Diagnosis not present

## 2019-02-16 DIAGNOSIS — G9009 Other idiopathic peripheral autonomic neuropathy: Secondary | ICD-10-CM | POA: Diagnosis not present

## 2019-02-16 DIAGNOSIS — I119 Hypertensive heart disease without heart failure: Secondary | ICD-10-CM | POA: Diagnosis not present

## 2019-02-20 DIAGNOSIS — F0280 Dementia in other diseases classified elsewhere without behavioral disturbance: Secondary | ICD-10-CM | POA: Diagnosis not present

## 2019-02-20 DIAGNOSIS — J9621 Acute and chronic respiratory failure with hypoxia: Secondary | ICD-10-CM | POA: Diagnosis not present

## 2019-02-20 DIAGNOSIS — I119 Hypertensive heart disease without heart failure: Secondary | ICD-10-CM | POA: Diagnosis not present

## 2019-02-20 DIAGNOSIS — G9009 Other idiopathic peripheral autonomic neuropathy: Secondary | ICD-10-CM | POA: Diagnosis not present

## 2019-02-20 DIAGNOSIS — G20 Parkinson's disease: Secondary | ICD-10-CM | POA: Diagnosis not present

## 2019-02-20 DIAGNOSIS — J441 Chronic obstructive pulmonary disease with (acute) exacerbation: Secondary | ICD-10-CM | POA: Diagnosis not present

## 2019-02-21 DIAGNOSIS — J9621 Acute and chronic respiratory failure with hypoxia: Secondary | ICD-10-CM | POA: Diagnosis not present

## 2019-02-21 DIAGNOSIS — F0280 Dementia in other diseases classified elsewhere without behavioral disturbance: Secondary | ICD-10-CM | POA: Diagnosis not present

## 2019-02-21 DIAGNOSIS — G20 Parkinson's disease: Secondary | ICD-10-CM | POA: Diagnosis not present

## 2019-02-21 DIAGNOSIS — J441 Chronic obstructive pulmonary disease with (acute) exacerbation: Secondary | ICD-10-CM | POA: Diagnosis not present

## 2019-02-21 DIAGNOSIS — G9009 Other idiopathic peripheral autonomic neuropathy: Secondary | ICD-10-CM | POA: Diagnosis not present

## 2019-02-21 DIAGNOSIS — I119 Hypertensive heart disease without heart failure: Secondary | ICD-10-CM | POA: Diagnosis not present

## 2019-02-23 DIAGNOSIS — J441 Chronic obstructive pulmonary disease with (acute) exacerbation: Secondary | ICD-10-CM | POA: Diagnosis not present

## 2019-02-23 DIAGNOSIS — G20 Parkinson's disease: Secondary | ICD-10-CM | POA: Diagnosis not present

## 2019-02-23 DIAGNOSIS — F0280 Dementia in other diseases classified elsewhere without behavioral disturbance: Secondary | ICD-10-CM | POA: Diagnosis not present

## 2019-02-23 DIAGNOSIS — G9009 Other idiopathic peripheral autonomic neuropathy: Secondary | ICD-10-CM | POA: Diagnosis not present

## 2019-02-23 DIAGNOSIS — I119 Hypertensive heart disease without heart failure: Secondary | ICD-10-CM | POA: Diagnosis not present

## 2019-02-23 DIAGNOSIS — J9621 Acute and chronic respiratory failure with hypoxia: Secondary | ICD-10-CM | POA: Diagnosis not present

## 2019-02-24 DIAGNOSIS — I119 Hypertensive heart disease without heart failure: Secondary | ICD-10-CM | POA: Diagnosis not present

## 2019-02-24 DIAGNOSIS — J9621 Acute and chronic respiratory failure with hypoxia: Secondary | ICD-10-CM | POA: Diagnosis not present

## 2019-02-24 DIAGNOSIS — J441 Chronic obstructive pulmonary disease with (acute) exacerbation: Secondary | ICD-10-CM | POA: Diagnosis not present

## 2019-02-24 DIAGNOSIS — G20 Parkinson's disease: Secondary | ICD-10-CM | POA: Diagnosis not present

## 2019-02-24 DIAGNOSIS — G9009 Other idiopathic peripheral autonomic neuropathy: Secondary | ICD-10-CM | POA: Diagnosis not present

## 2019-02-24 DIAGNOSIS — F0280 Dementia in other diseases classified elsewhere without behavioral disturbance: Secondary | ICD-10-CM | POA: Diagnosis not present

## 2019-02-26 ENCOUNTER — Other Ambulatory Visit: Payer: Self-pay | Admitting: Internal Medicine

## 2019-02-26 DIAGNOSIS — F419 Anxiety disorder, unspecified: Secondary | ICD-10-CM

## 2019-02-28 DIAGNOSIS — I119 Hypertensive heart disease without heart failure: Secondary | ICD-10-CM | POA: Diagnosis not present

## 2019-02-28 DIAGNOSIS — F0280 Dementia in other diseases classified elsewhere without behavioral disturbance: Secondary | ICD-10-CM | POA: Diagnosis not present

## 2019-02-28 DIAGNOSIS — G9009 Other idiopathic peripheral autonomic neuropathy: Secondary | ICD-10-CM | POA: Diagnosis not present

## 2019-02-28 DIAGNOSIS — J9621 Acute and chronic respiratory failure with hypoxia: Secondary | ICD-10-CM | POA: Diagnosis not present

## 2019-02-28 DIAGNOSIS — G20 Parkinson's disease: Secondary | ICD-10-CM | POA: Diagnosis not present

## 2019-02-28 DIAGNOSIS — J441 Chronic obstructive pulmonary disease with (acute) exacerbation: Secondary | ICD-10-CM | POA: Diagnosis not present

## 2019-03-01 DIAGNOSIS — G9009 Other idiopathic peripheral autonomic neuropathy: Secondary | ICD-10-CM | POA: Diagnosis not present

## 2019-03-01 DIAGNOSIS — J441 Chronic obstructive pulmonary disease with (acute) exacerbation: Secondary | ICD-10-CM | POA: Diagnosis not present

## 2019-03-01 DIAGNOSIS — F0280 Dementia in other diseases classified elsewhere without behavioral disturbance: Secondary | ICD-10-CM | POA: Diagnosis not present

## 2019-03-01 DIAGNOSIS — J9621 Acute and chronic respiratory failure with hypoxia: Secondary | ICD-10-CM | POA: Diagnosis not present

## 2019-03-01 DIAGNOSIS — I119 Hypertensive heart disease without heart failure: Secondary | ICD-10-CM | POA: Diagnosis not present

## 2019-03-01 DIAGNOSIS — G20 Parkinson's disease: Secondary | ICD-10-CM | POA: Diagnosis not present

## 2019-03-02 NOTE — Progress Notes (Signed)
THIS ENCOUNTER IS A VIRTUAL VISIT DUE TO COVID-19 - PATIENT WAS NOT SEEN IN THE OFFICE.  PATIENT HAS CONSENTED TO VIRTUAL VISIT / TELEMEDICINE VISIT  This provider placed a call to [employer] using telephone, her appointment was changed to a virtual office visit to reduce the risk of exposure to the COVID-19 virus and to help [employer] remain healthy and safe. The virtual visit will also provide continuity of care. She verbalizes understanding.   Virtual Visit via telephone Note  I connected with  Malva Cogan , her husband, Her daughter Marcie Bal and her caretaker - LaToya  on 03/05/19  by telephone.  I verified that I am speaking with the correct person using two identifiers.        I discussed the limitations of evaluation and management by telemedicine and the availability of in person appointments. The patient expressed understanding and agreed to proceed.  History of Present Illness:      This very nice 83 y.o.  MWF presents for 3 month follow up with HTN, HLD, Restrictive Lung Disease, Hx/o Asthma, T2_DM   and Vitamin D Deficiency. Patient is followed by Neurology for Parkinson's plus syndrome with unstable gait & mild to moderate Dementia with limited insight  and comprehension. Patient's major limiting factors are her unstable gait and her COPD (Restrictive Lung Disease).       Patient is treated for HTN (1996) & BP has been controlled at home. Today's BP is at goal - 130/65. Patient has had no complaints of any cardiac type chest pain, palpitations, dyspnea / orthopnea / PND, dizziness, claudication, or dependent edema.      Hyperlipidemia is controlled with diet & meds. Patient denies myalgias or other med SE's. Last Lipids were at goal albeit slightly elevated Triglycerides:  Lab Results  Component Value Date   CHOL 151 11/16/2018   HDL 56 11/16/2018   LDLCALC 71 11/16/2018   TRIG 160 (H) 11/16/2018   CHOLHDL 2.7 11/16/2018       Also, the patient has history  of  PreDiabetes (A1c 6.0% / 2011)and has had no symptoms of reactive hypoglycemia, diabetic polys, paresthesias or visual blurring.  Last A1c was not at goal: Lab Results  Component Value Date   HGBA1C 6.4 (H) 11/16/2018      Further, the patient also has history of Vitamin D Deficiency ("10" / 2008)  and supplements vitamin D without any suspected side-effects. Last vitamin D was not at goal: Lab Results  Component Value Date   VD25OH 31 08/15/2018    Current Outpatient Medications on File Prior to Visit  Medication Sig  . albuterol 0.083% neb soln Use 1 ampule in nebulizer 4 times a day or every 4 hours as needed to rescue asthma  . ALPRAZolam 0.5 MG tablet Take 1/2-1 tablet 2 - 3 x /day ONLY if needed for Anxiety Attack   . aspirin 81 MG tablet Take 81 mg by mouth daily. Buys OTC  . carbidopa-levodopa  25-100 MG tablet Take 2 tablet at 9am and 2 tablets at 3pm.   . VITAMIN D 2000 units Take 2 capsules  daily.   Marland Kitchen ENSURE (ENSURE) Take 237 mLs by mouth daily as needed.   . furosemide (LASIX) 20 MG tablet  Lasix 40 mg  daily  . metoprolol tart 25 MG tablet Take 0.5 tablets 2 (two) times daily.  Marland Kitchen omeprazole40 MG capsule Take 1 capsule (40 mg total) by mouth daily.  . OXYGEN  Inhale 2 L into the lungs daily as needed   . potassium chloride SA  20 MEQ tab Take 1 tablet by mouth daily.  . sertraline25 MG tablet Take 1 tablet (25 mg total) by mouth daily.  Viviana Simpler ELLIPTA  Daily    Allergies  Allergen Reactions  . Demerol  [Meperidine Hcl] Other (See Comments)    Pt. Feels like she is suffocating  . Ertapenem Hives    Caused whole body to turn red Other reaction(s): Redness Caused whole body to turn red  . Codeine Other (See Comments)    hallucinations  . Demerol Other (See Comments)    hallucinations  . Morphine And Related Other (See Comments)    hallucinations  . Other Other (See Comments)    Invan 7- pt became red all over  . Sulfate Other (See Comments)    unknown  .  Sulfa Antibiotics Rash   PMHx:   Past Medical History:  Diagnosis Date  . Balance disorder 2008.     Falls a lot.  She can be standing and then leans too far over to one side   . Bulging disc   . Chronic bronchitis ([place])    due to congestion at times, on prednisone and advair  . COPD (chronic obstructive pulmonary disease) ([place])   . COPD with acute exacerbation ([place]) 12/15/2017  . Depression    many years ago  . GERD (gastroesophageal reflux disease)   . Hearing loss    mild  . Hyperlipidemia   . Hypertension   . Iatrogenic adrenal insufficiency ([place])   . Incontinence    not indicated at this visit.  Marland Kitchen LVH (left ventricular hypertrophy) due to hypertensive disease    a. 02/2017: echo showing an EF of 65-70% with moderate LVH, hyperdynamic LV with subvalvular gradient of 70 mmHg, SAM, and mild MR  . Neuropathy    legs stay numb   . Osteoarthritis    hands,   . Osteoporosis   . Shortness of breath dyspnea   . Tubulovillous adenoma of rectum   . Wears dentures   . Wears glasses    Immunization History  Administered Date(s) Administered  . Influenza, High Dose Seasonal PF 08/23/2014, 07/24/2015, 08/13/2016, 07/12/2017, 07/13/2018  . Pneumococcal Conjugate-13 08/23/2014  . Pneumococcal-Unspecified 07/20/2003, 08/17/2013  . Td 08/17/2013  . Tdap 10/25/2012  . Zoster 03/19/2015   Past Surgical History:  Procedure Laterality Date  . ABDOMINAL HYSTERECTOMY    . APPENDECTOMY    . CHONDROPLASTY  09/19/2014   Procedure: CHONDROPLASTY;  Surgeon: Alta Corning, MD;  Location: [facility];  Service: Orthopedics;;  . DILATION AND CURETTAGE OF UTERUS    . EXTERNAL FIXATION LEG  10/25/2012   Procedure: EXTERNAL FIXATION LEG;  Surgeon: Rozanna Box, MD;  Location: [facility];  Service: Orthopedics;  Laterality: Right;  . EYE SURGERY     bilateral cataract surgery and lens implant  . FINGER SURGERY     fusions and debridements for OA  . INCONTINENCE SURGERY     multiple  procedures, not cured  . KNEE ARTHROSCOPY WITH LATERAL MENISECTOMY Right 09/19/2014   Procedure: KNEE ARTHROSCOPY WITH LATERAL MENISECTOMY;  Surgeon: Alta Corning, MD;  Location: [facility];  Service: Orthopedics;  Laterality: Right;  . KNEE ARTHROSCOPY WITH MEDIAL MENISECTOMY Right 09/19/2014   Procedure: RIGHT KNEE ARTHROSCOPY WITH MEDIAL AND LATERAL MENISECTOMIES. CHONDROPLASTY OF PATELLA-FEMORAL JOINT;  Surgeon: Alta Corning, MD;  Location: [facility];  Service: Orthopedics;  Laterality: Right;  . RECTAL BIOPSY  09/21/2011   Procedure: BIOPSY RECTAL;  Surgeon: Merrie Roof, MD;  Location: [facility];  Service: General;  Laterality: N/A;  3-4 cm  . RECTAL SURGERY     by dr. Marlou Starks, removal of polyp  . TONSILLECTOMY     FHx:    Reviewed / unchanged  SHx:    Reviewed / unchanged   Systems Review:  Constitutional: Denies fever, chills, wt changes, headaches, insomnia, fatigue, night sweats, change in appetite. Eyes: Denies redness, blurred vision, diplopia, discharge, itchy, watery eyes.  ENT: Denies discharge, congestion, post nasal drip, epistaxis, sore throat, earache, hearing loss, dental pain, tinnitus, vertigo, sinus pain, snoring.  CV: Denies chest pain, palpitations, irregular heartbeat, syncope, dyspnea, diaphoresis, orthopnea, PND, claudication or edema. Respiratory: denies cough, dyspnea, DOE, pleurisy, hoarseness, laryngitis, wheezing.  Gastrointestinal: Denies dysphagia, odynophagia, heartburn, reflux, water brash, abdominal pain or cramps, nausea, vomiting, bloating, diarrhea, constipation, hematemesis, melena, hematochezia  or hemorrhoids. Genitourinary: Denies dysuria, frequency, urgency, nocturia, hesitancy, discharge, hematuria or flank pain. Musculoskeletal: Denies arthralgias, myalgias, stiffness, jt. swelling, pain, limping or strain/sprain.  Skin: Denies pruritus, rash, hives, warts, acne, eczema or change in skin lesion(s). Neuro: No weakness,  tremor, incoordination, spasms, paresthesia or pain. Psychiatric: Denies confusion, memory loss or sensory loss. Endo: Denies change in weight, skin or hair change.  Heme/Lymph: No excessive bleeding, bruising or enlarged lymph nodes.  Physical Exam  BP 130/65   Pulse 73   Temp (!) 96.5 F (35.8 C)   Resp 11   Ht 5\' 4"  (1.626 m)   Wt 140 lb (63.5 kg)   SpO2 96%   BMI 24.03 kg/m   General : Well sounding patient in no apparent distress HEENT: no hoarseness, no cough for duration of visit Lungs: speaks in complete sentences, no audible wheezing, no apparent distress Neurological: alert, oriented x 3 Psychiatric: pleasant, judgement appropriate   Assessment and Plan:  1. Essential hypertension  - Continue medication, monitor blood pressure at home.  - Continue DASH diet.  Reminder to go to the ER if any CP,  SOB, nausea, dizziness, severe HA, changes vision/speech.  2. Hyperlipidemia, mixed  - Continue diet/meds, exercise,& lifestyle modifications.  - Continue monitor periodic cholesterol/liver & renal functions   3. Abnormal glucose  - Continue diet, exercise  - Lifestyle modifications.  - Monitor appropriate labs.  4. Vitamin D deficiency - Continue supplementation.  5. Prediabetes  6. Parkinson's plus syndrome ([place])  7. Chronic respiratory failure with hypoxia, on home O2 therapy (HCC)       Discussed  regular exercise, BP monitoring, weight control to achieve/maintain BMI less than 25 and discussed med and SE's. Recommended labs to assess and monitor clinical status with further disposition pending results of labs. I discussed the assessment and treatment plan with the patient. The patient was provided an opportunity to ask questions and all were answered. The patient agreed with the plan and demonstrated an understanding of the instructions.Over 30 minutes of exam, counseling, chart review was performed.          I provided  minutes of non-face-to-face time  during this encounter and over 28 minutes of exam, counseling, chart review and  complex critical decision making was performed   Kirtland Bouchard, MD

## 2019-03-03 ENCOUNTER — Encounter: Payer: Self-pay | Admitting: Internal Medicine

## 2019-03-03 ENCOUNTER — Ambulatory Visit: Payer: Medicare Other | Admitting: Internal Medicine

## 2019-03-03 VITALS — BP 130/65 | HR 73 | Temp 96.5°F | Resp 11 | Ht 64.0 in | Wt 140.0 lb

## 2019-03-03 DIAGNOSIS — I10 Essential (primary) hypertension: Secondary | ICD-10-CM | POA: Diagnosis not present

## 2019-03-03 DIAGNOSIS — G232 Striatonigral degeneration: Secondary | ICD-10-CM

## 2019-03-03 DIAGNOSIS — E559 Vitamin D deficiency, unspecified: Secondary | ICD-10-CM | POA: Diagnosis not present

## 2019-03-03 DIAGNOSIS — R7303 Prediabetes: Secondary | ICD-10-CM

## 2019-03-03 DIAGNOSIS — R7309 Other abnormal glucose: Secondary | ICD-10-CM

## 2019-03-03 DIAGNOSIS — J9611 Chronic respiratory failure with hypoxia: Secondary | ICD-10-CM | POA: Diagnosis not present

## 2019-03-03 DIAGNOSIS — G20C Parkinsonism, unspecified: Secondary | ICD-10-CM

## 2019-03-03 DIAGNOSIS — E782 Mixed hyperlipidemia: Secondary | ICD-10-CM | POA: Diagnosis not present

## 2019-03-03 DIAGNOSIS — Z9981 Dependence on supplemental oxygen: Secondary | ICD-10-CM | POA: Diagnosis not present

## 2019-03-03 NOTE — Patient Instructions (Signed)

## 2019-03-06 ENCOUNTER — Other Ambulatory Visit: Payer: Self-pay

## 2019-03-08 DIAGNOSIS — G20 Parkinson's disease: Secondary | ICD-10-CM | POA: Diagnosis not present

## 2019-03-08 DIAGNOSIS — J441 Chronic obstructive pulmonary disease with (acute) exacerbation: Secondary | ICD-10-CM | POA: Diagnosis not present

## 2019-03-08 DIAGNOSIS — F0280 Dementia in other diseases classified elsewhere without behavioral disturbance: Secondary | ICD-10-CM | POA: Diagnosis not present

## 2019-03-08 DIAGNOSIS — G9009 Other idiopathic peripheral autonomic neuropathy: Secondary | ICD-10-CM | POA: Diagnosis not present

## 2019-03-08 DIAGNOSIS — I119 Hypertensive heart disease without heart failure: Secondary | ICD-10-CM | POA: Diagnosis not present

## 2019-03-08 DIAGNOSIS — J9621 Acute and chronic respiratory failure with hypoxia: Secondary | ICD-10-CM | POA: Diagnosis not present

## 2019-03-09 DIAGNOSIS — G20 Parkinson's disease: Secondary | ICD-10-CM | POA: Diagnosis not present

## 2019-03-09 DIAGNOSIS — I119 Hypertensive heart disease without heart failure: Secondary | ICD-10-CM | POA: Diagnosis not present

## 2019-03-09 DIAGNOSIS — G9009 Other idiopathic peripheral autonomic neuropathy: Secondary | ICD-10-CM | POA: Diagnosis not present

## 2019-03-09 DIAGNOSIS — J9621 Acute and chronic respiratory failure with hypoxia: Secondary | ICD-10-CM | POA: Diagnosis not present

## 2019-03-09 DIAGNOSIS — J441 Chronic obstructive pulmonary disease with (acute) exacerbation: Secondary | ICD-10-CM | POA: Diagnosis not present

## 2019-03-09 DIAGNOSIS — F0280 Dementia in other diseases classified elsewhere without behavioral disturbance: Secondary | ICD-10-CM | POA: Diagnosis not present

## 2019-03-12 ENCOUNTER — Other Ambulatory Visit: Payer: Self-pay | Admitting: Internal Medicine

## 2019-03-13 DIAGNOSIS — R1312 Dysphagia, oropharyngeal phase: Secondary | ICD-10-CM | POA: Diagnosis not present

## 2019-03-13 DIAGNOSIS — I69391 Dysphagia following cerebral infarction: Secondary | ICD-10-CM | POA: Diagnosis not present

## 2019-03-13 DIAGNOSIS — K219 Gastro-esophageal reflux disease without esophagitis: Secondary | ICD-10-CM | POA: Diagnosis not present

## 2019-03-13 DIAGNOSIS — J9621 Acute and chronic respiratory failure with hypoxia: Secondary | ICD-10-CM | POA: Diagnosis not present

## 2019-03-13 DIAGNOSIS — F339 Major depressive disorder, recurrent, unspecified: Secondary | ICD-10-CM | POA: Diagnosis not present

## 2019-03-13 DIAGNOSIS — Z9049 Acquired absence of other specified parts of digestive tract: Secondary | ICD-10-CM | POA: Diagnosis not present

## 2019-03-13 DIAGNOSIS — M810 Age-related osteoporosis without current pathological fracture: Secondary | ICD-10-CM | POA: Diagnosis not present

## 2019-03-13 DIAGNOSIS — F411 Generalized anxiety disorder: Secondary | ICD-10-CM | POA: Diagnosis not present

## 2019-03-13 DIAGNOSIS — M1990 Unspecified osteoarthritis, unspecified site: Secondary | ICD-10-CM | POA: Diagnosis not present

## 2019-03-13 DIAGNOSIS — G9009 Other idiopathic peripheral autonomic neuropathy: Secondary | ICD-10-CM | POA: Diagnosis not present

## 2019-03-13 DIAGNOSIS — Z90710 Acquired absence of both cervix and uterus: Secondary | ICD-10-CM | POA: Diagnosis not present

## 2019-03-13 DIAGNOSIS — Z9841 Cataract extraction status, right eye: Secondary | ICD-10-CM | POA: Diagnosis not present

## 2019-03-13 DIAGNOSIS — Z961 Presence of intraocular lens: Secondary | ICD-10-CM | POA: Diagnosis not present

## 2019-03-13 DIAGNOSIS — G20 Parkinson's disease: Secondary | ICD-10-CM | POA: Diagnosis not present

## 2019-03-13 DIAGNOSIS — B370 Candidal stomatitis: Secondary | ICD-10-CM | POA: Diagnosis not present

## 2019-03-13 DIAGNOSIS — I119 Hypertensive heart disease without heart failure: Secondary | ICD-10-CM | POA: Diagnosis not present

## 2019-03-13 DIAGNOSIS — H9190 Unspecified hearing loss, unspecified ear: Secondary | ICD-10-CM | POA: Diagnosis not present

## 2019-03-13 DIAGNOSIS — J441 Chronic obstructive pulmonary disease with (acute) exacerbation: Secondary | ICD-10-CM | POA: Diagnosis not present

## 2019-03-13 DIAGNOSIS — Z9842 Cataract extraction status, left eye: Secondary | ICD-10-CM | POA: Diagnosis not present

## 2019-03-13 DIAGNOSIS — F0280 Dementia in other diseases classified elsewhere without behavioral disturbance: Secondary | ICD-10-CM | POA: Diagnosis not present

## 2019-03-13 DIAGNOSIS — Z9181 History of falling: Secondary | ICD-10-CM | POA: Diagnosis not present

## 2019-03-15 DIAGNOSIS — G20 Parkinson's disease: Secondary | ICD-10-CM | POA: Diagnosis not present

## 2019-03-15 DIAGNOSIS — R1312 Dysphagia, oropharyngeal phase: Secondary | ICD-10-CM | POA: Diagnosis not present

## 2019-03-15 DIAGNOSIS — I69391 Dysphagia following cerebral infarction: Secondary | ICD-10-CM | POA: Diagnosis not present

## 2019-03-15 DIAGNOSIS — J441 Chronic obstructive pulmonary disease with (acute) exacerbation: Secondary | ICD-10-CM | POA: Diagnosis not present

## 2019-03-15 DIAGNOSIS — F0280 Dementia in other diseases classified elsewhere without behavioral disturbance: Secondary | ICD-10-CM | POA: Diagnosis not present

## 2019-03-15 DIAGNOSIS — I119 Hypertensive heart disease without heart failure: Secondary | ICD-10-CM | POA: Diagnosis not present

## 2019-03-16 DIAGNOSIS — I119 Hypertensive heart disease without heart failure: Secondary | ICD-10-CM | POA: Diagnosis not present

## 2019-03-16 DIAGNOSIS — J441 Chronic obstructive pulmonary disease with (acute) exacerbation: Secondary | ICD-10-CM | POA: Diagnosis not present

## 2019-03-16 DIAGNOSIS — R1312 Dysphagia, oropharyngeal phase: Secondary | ICD-10-CM | POA: Diagnosis not present

## 2019-03-16 DIAGNOSIS — G20 Parkinson's disease: Secondary | ICD-10-CM | POA: Diagnosis not present

## 2019-03-16 DIAGNOSIS — F0280 Dementia in other diseases classified elsewhere without behavioral disturbance: Secondary | ICD-10-CM | POA: Diagnosis not present

## 2019-03-16 DIAGNOSIS — I69391 Dysphagia following cerebral infarction: Secondary | ICD-10-CM | POA: Diagnosis not present

## 2019-03-20 ENCOUNTER — Emergency Department (HOSPITAL_COMMUNITY): Payer: Medicare Other

## 2019-03-20 ENCOUNTER — Emergency Department (HOSPITAL_COMMUNITY)
Admission: EM | Admit: 2019-03-20 | Discharge: 2019-03-20 | Disposition: A | Discharge status: Home / Self Care | Payer: Medicare Other | Attending: Emergency Medicine | Admitting: Emergency Medicine

## 2019-03-20 ENCOUNTER — Encounter (HOSPITAL_COMMUNITY): Payer: Self-pay

## 2019-03-20 ENCOUNTER — Other Ambulatory Visit: Payer: Self-pay

## 2019-03-20 DIAGNOSIS — I5033 Acute on chronic diastolic (congestive) heart failure: Secondary | ICD-10-CM | POA: Insufficient documentation

## 2019-03-20 DIAGNOSIS — G20 Parkinson's disease: Secondary | ICD-10-CM | POA: Diagnosis not present

## 2019-03-20 DIAGNOSIS — I5023 Acute on chronic systolic (congestive) heart failure: Secondary | ICD-10-CM | POA: Diagnosis not present

## 2019-03-20 DIAGNOSIS — J441 Chronic obstructive pulmonary disease with (acute) exacerbation: Secondary | ICD-10-CM | POA: Diagnosis not present

## 2019-03-20 DIAGNOSIS — I110 Hypertensive heart disease with heart failure: Secondary | ICD-10-CM | POA: Diagnosis not present

## 2019-03-20 DIAGNOSIS — I69391 Dysphagia following cerebral infarction: Secondary | ICD-10-CM | POA: Diagnosis not present

## 2019-03-20 DIAGNOSIS — R1312 Dysphagia, oropharyngeal phase: Secondary | ICD-10-CM | POA: Diagnosis not present

## 2019-03-20 DIAGNOSIS — I119 Hypertensive heart disease without heart failure: Secondary | ICD-10-CM | POA: Diagnosis not present

## 2019-03-20 DIAGNOSIS — Z7982 Long term (current) use of aspirin: Secondary | ICD-10-CM | POA: Insufficient documentation

## 2019-03-20 DIAGNOSIS — I509 Heart failure, unspecified: Secondary | ICD-10-CM

## 2019-03-20 DIAGNOSIS — F0280 Dementia in other diseases classified elsewhere without behavioral disturbance: Secondary | ICD-10-CM | POA: Diagnosis not present

## 2019-03-20 DIAGNOSIS — Z79899 Other long term (current) drug therapy: Secondary | ICD-10-CM | POA: Diagnosis not present

## 2019-03-20 DIAGNOSIS — J449 Chronic obstructive pulmonary disease, unspecified: Secondary | ICD-10-CM | POA: Insufficient documentation

## 2019-03-20 DIAGNOSIS — R0602 Shortness of breath: Secondary | ICD-10-CM | POA: Diagnosis not present

## 2019-03-20 DIAGNOSIS — R531 Weakness: Secondary | ICD-10-CM | POA: Diagnosis not present

## 2019-03-20 LAB — COMPREHENSIVE METABOLIC PANEL
ALT: 5 U/L (ref 0–44)
AST: 16 U/L (ref 15–41)
Albumin: 3.3 g/dL — ABNORMAL LOW (ref 3.5–5.0)
Alkaline Phosphatase: 47 U/L (ref 38–126)
Anion gap: 10 (ref 5–15)
BUN: 26 mg/dL — ABNORMAL HIGH (ref 8–23)
CO2: 27 mmol/L (ref 22–32)
Calcium: 8.3 mg/dL — ABNORMAL LOW (ref 8.9–10.3)
Chloride: 104 mmol/L (ref 98–111)
Creatinine, Ser: 0.97 mg/dL (ref 0.44–1.00)
GFR calc Af Amer: >60 mL/min (ref >60)
GFR calc non Af Amer: 55 mL/min — ABNORMAL LOW (ref >60)
Glucose, Bld: 123 mg/dL — ABNORMAL HIGH (ref 70–99)
Potassium: 3.5 mmol/L (ref 3.5–5.1)
Sodium: 141 mmol/L (ref 135–145)
Total Bilirubin: 0.6 mg/dL (ref 0.3–1.2)
Total Protein: 6.3 g/dL — ABNORMAL LOW (ref 6.5–8.1)

## 2019-03-20 LAB — URINALYSIS, ROUTINE W REFLEX MICROSCOPIC
Bilirubin Urine: NEGATIVE
Glucose, UA: NEGATIVE mg/dL
Hgb urine dipstick: NEGATIVE
Ketones, ur: NEGATIVE mg/dL
Leukocytes,Ua: NEGATIVE
Nitrite: NEGATIVE
Protein, ur: NEGATIVE mg/dL
Specific Gravity, Urine: 1.006 (ref 1.005–1.030)
pH: 5 (ref 5.0–8.0)

## 2019-03-20 LAB — CBC WITH DIFFERENTIAL/PLATELET
Abs Immature Granulocytes: 0.06 10*3/uL (ref 0.00–0.07)
Basophils Absolute: 0 10*3/uL (ref 0.0–0.1)
Basophils Relative: 1 %
Eosinophils Absolute: 0.3 10*3/uL (ref 0.0–0.5)
Eosinophils Relative: 3 %
HCT: 42 % (ref 36.0–46.0)
Hemoglobin: 12.5 g/dL (ref 12.0–15.0)
Immature Granulocytes: 1 %
Lymphocytes Relative: 19 %
Lymphs Abs: 1.5 10*3/uL (ref 0.7–4.0)
MCH: 25.8 pg — ABNORMAL LOW (ref 26.0–34.0)
MCHC: 29.8 g/dL — ABNORMAL LOW (ref 30.0–36.0)
MCV: 86.6 fL (ref 80.0–100.0)
Monocytes Absolute: 0.4 10*3/uL (ref 0.1–1.0)
Monocytes Relative: 5 %
Neutro Abs: 5.6 10*3/uL (ref 1.7–7.7)
Neutrophils Relative %: 71 %
Platelets: 281 10*3/uL (ref 150–400)
RBC: 4.85 MIL/uL (ref 3.87–5.11)
RDW: 14.9 % (ref 11.5–15.5)
WBC: 7.9 10*3/uL (ref 4.0–10.5)
nRBC: 0 % (ref 0.0–0.2)

## 2019-03-20 LAB — BRAIN NATRIURETIC PEPTIDE: B Natriuretic Peptide: 273 pg/mL — ABNORMAL HIGH (ref 0.0–100.0)

## 2019-03-20 LAB — TROPONIN I: Troponin I: <0.03 ng/mL (ref <0.03)

## 2019-03-20 MED ORDER — FUROSEMIDE 10 MG/ML IJ SOLN
40.0000 mg | Freq: Once | INTRAMUSCULAR | Status: AC
  Administered 2019-03-20: 40 mg via INTRAVENOUS
  Filled 2019-03-20: qty 4

## 2019-03-20 MED ORDER — ALBUTEROL SULFATE HFA 108 (90 BASE) MCG/ACT IN AERS
4.0000 | INHALATION_SPRAY | Freq: Once | RESPIRATORY_TRACT | Status: AC
  Administered 2019-03-20: 4 via RESPIRATORY_TRACT
  Filled 2019-03-20: qty 6.7

## 2019-03-20 NOTE — Discharge Instructions (Addendum)
You were evaluated in the Emergency Department and after careful evaluation, we did not find any emergent condition requiring admission or further testing in the hospital.  Your symptoms today seem to be due to increased fluid on your lungs due to your heart  Please return to the Emergency Department if you experience any worsening of your condition.  We encourage you to follow up with a primary care provider.  Thank you for allowing [place] to be a part of your care.

## 2019-03-20 NOTE — ED Notes (Signed)
O2 d/c per Dr Sedonia Small

## 2019-03-20 NOTE — ED Triage Notes (Signed)
EMS reports pt has copd.  Reports started having worsening sob since last night.  Pt uses neb treatments approx every 4 hours.  EMS put pt on NRB and sat 99%, co2 33.  EMS says temp 98.3.

## 2019-03-20 NOTE — ED Provider Notes (Signed)
[facility] EMERGENCY DEPARTMENT Provider Note   CSN: [id number] Arrival date & time: 03/20/19  1717    History   Chief Complaint Chief Complaint  Patient presents with  . Shortness of Breath    HPI Marie [name] is a 83 y.o. female.     HPI   Pt is an 83 y/o female with ah /oCOPD, GERD, HTN, HLD, iatragenic adrenal insufficiency, CHF, who presents to the ED today for eval of shortness of breath.  Patient's daughter is at bedside and assist with a history.  She states that the patient has been complaining of "not feeling well "for the last 2 days."  States patient had to sleep with her O2 last night.  She has home O2 that she uses as needed about once per week.  Today she noted that the patient seemed to have increased work of breathing.  She has noticed that the patient's chronic cough has seemed more wet this week but that it has been nonproductive.   She denies that the patient has been exposed to any known sick contacts.  Per patient, patient states that she just "feels bad ".  She does note that she has had some increased swelling to her lower extremities.  Denies any explicit shortness of breath.  Denies chest pain, abdominal pain nausea or vomiting.  No sore throat rhinorrhea or congestion.  No diarrhea, vomiting or urinary symptoms.  Past Medical History:  Diagnosis Date  . Balance disorder 2008.     Falls a lot.  She can be standing and then leans too far over to one side   . Bulging disc   . Chronic bronchitis ([place])    due to congestion at times, on prednisone and advair  . COPD (chronic obstructive pulmonary disease) ([place])   . COPD with acute exacerbation ([place]) 12/15/2017  . Depression    many years ago  . GERD (gastroesophageal reflux disease)   . Hearing loss    mild  . Hyperlipidemia   . Hypertension   . Iatrogenic adrenal insufficiency ([place])   . Incontinence    not indicated at this visit.  Marland Kitchen LVH (left ventricular hypertrophy) due to hypertensive disease     a. 02/2017: echo showing an EF of 65-70% with moderate LVH, hyperdynamic LV with subvalvular gradient of 70 mmHg, SAM, and mild MR  . Neuropathy    legs stay numb   . Osteoarthritis    hands,   . Osteoporosis   . Shortness of breath dyspnea   . Tubulovillous adenoma of rectum   . Wears dentures   . Wears glasses     Patient Active Problem List   Diagnosis Date Noted  . Acute on chronic respiratory failure with hypoxia ([place]) 12/01/2018  . COPD with acute exacerbation ([place]) 12/01/2018  . AKI (acute kidney injury) ([place]) 12/01/2018  . COPD exacerbation ([place])   . Hypoxia   . Asthmatic bronchitis 01/17/2018  . Chronic diastolic CHF (congestive heart failure) ([place]) 12/15/2017  . Anxiety 12/15/2017  . Subvalvular aortic stenosis determined by imaging 11/15/2017  . Pedal edema 11/15/2017  . Problem with medical care compliance 11/11/2017  . Aortic atherosclerosis ([place]) 10/19/2017  . Dysphagia 09/28/2017  . Chronic respiratory failure with hypoxia, on home O2 therapy ([place]) 09/20/2017  . Hypertensive heart disease 03/29/2017  . Protein-calorie malnutrition, severe 03/17/2017  . 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 Restrictive lung disease 02/24/2017  . LVH (left ventricular hypertrophy) 01/07/2017  . Depression, major, in remission (  [place]) 09/22/2016  . At high risk for falls 10/16/2015  . Prediabetes 08/23/2014  . Vitamin D deficiency 08/23/2014  . Medication management 08/23/2014  . Unstable Gait 01/30/2014  . Incontinence   . Hyperlipidemia   . GERD (gastroesophageal reflux disease)   . Hereditary and idiopathic peripheral neuropathy   . Essential hypertension   . Tubulovillous adenoma of rectum 09/04/2011    Past Surgical History:  Procedure Laterality Date  . ABDOMINAL HYSTERECTOMY    . APPENDECTOMY    . CHONDROPLASTY  09/19/2014   Procedure: CHONDROPLASTY;  Surgeon: Alta Corning, MD;  Location: [place];  Service: Orthopedics;;  . DILATION AND  CURETTAGE OF UTERUS    . EXTERNAL FIXATION LEG  10/25/2012   Procedure: EXTERNAL FIXATION LEG;  Surgeon: Rozanna Box, MD;  Location: [place];  Service: Orthopedics;  Laterality: Right;  . EYE SURGERY     bilateral cataract surgery and lens implant  . FINGER SURGERY     fusions and debridements for OA  . INCONTINENCE SURGERY     multiple procedures, not cured  . KNEE ARTHROSCOPY WITH LATERAL MENISECTOMY Right 09/19/2014   Procedure: KNEE ARTHROSCOPY WITH LATERAL MENISECTOMY;  Surgeon: Alta Corning, MD;  Location: [place];  Service: Orthopedics;  Laterality: Right;  . KNEE ARTHROSCOPY WITH MEDIAL MENISECTOMY Right 09/19/2014   Procedure: RIGHT KNEE ARTHROSCOPY WITH MEDIAL AND LATERAL MENISECTOMIES. CHONDROPLASTY OF PATELLA-FEMORAL JOINT;  Surgeon: Alta Corning, MD;  Location: [place];  Service: Orthopedics;  Laterality: Right;  . RECTAL BIOPSY  09/21/2011   Procedure: BIOPSY RECTAL;  Surgeon: Merrie Roof, MD;  Location: [place];  Service: General;  Laterality: N/A;  3-4 cm  . RECTAL SURGERY     by dr. Marlou Starks, removal of polyp  . TONSILLECTOMY       OB History   No obstetric history on file.      Home Medications    Prior to Admission medications   Medication Sig Start Date End Date Taking? Authorizing Provider  albuterol (PROVENTIL) (2.5 MG/3ML) 0.083% nebulizer solution Use 1 ampule in nebulizer 4 times a day or every 4 hours as needed to rescue asthma. Patient taking differently: Take 2.5 mg by nebulization See admin instructions. Use 1 ampule in nebulizer 4 times a day or every 4 hours as needed to rescue asthma. 04/04/18 04/04/19 Yes Erline Hau, MD  ALPRAZolam Duanne Moron) 0.5 MG tablet Take 1/2-1 tablet 2 - 3 x /day ONLY if needed for Anxiety Attack &  limit to 5 days /week to avoid addiction Patient taking differently: Take 0.5 mg by mouth 2 (two) times daily.  02/26/19  Yes [name], MD  aspirin 81 MG tablet Take 81 mg by mouth  daily. Buys OTC   Yes [provider]  carbidopa-levodopa (SINEMET IR) 25-100 MG tablet Take 2 tablet at 9am and 2 tablets at 3pm. Patient taking differently: Take 2 tablets by mouth 2 (two) times daily. Take 2 tablet at 9am and 2 tablets at 3pm. 03/04/18  Yes Patel, Donika K, DO  Cholecalciferol (VITAMIN D) 2000 units CAPS Take 2 capsules (4,000 Units total) by mouth daily. Patient taking differently: Take 2,000 Units by mouth daily.  08/16/18  Yes Liane Comber, NP  furosemide (LASIX) 20 MG tablet Increase lasix for 40 mg (2 tabs) daily for 3-5 days until swelling resolves, then resume 20 mg (1 tab daily). Patient taking differently: Take 20 mg by mouth daily. Increase  lasix for 40 mg (2 tabs) daily for 3-5 days until swelling resolves, then resume 20 mg (1 tab daily). 08/15/18  Yes Liane Comber, NP  metoprolol tartrate (LOPRESSOR) 25 MG tablet Take 0.5 tablets (12.5 mg total) by mouth 2 (two) times daily. 12/05/18  Yes Tat, Shanon Brow, MD  omeprazole (PRILOSEC) 40 MG capsule Take 1 capsule (40 mg total) by mouth daily. 09/19/18  Yes Vicie Mutters, PA-C  OXYGEN Inhale 2 L into the lungs daily as needed (for shortness of breath). Setting is on 2 when needed    Yes [provider]  potassium chloride SA (K-DUR,KLOR-CON) 20 MEQ tablet Take 1 tablet by mouth daily. 09/19/18  Yes Vicie Mutters, PA-C  predniSONE (DELTASONE) 10 MG tablet Take 1 tablet 3 x /day or as directed Patient taking differently: Take 10 mg by mouth 2 (two) times a day.  03/12/19  Yes [name], MD  sertraline (ZOLOFT) 25 MG tablet Take 1 tablet (25 mg total) by mouth daily. Patient taking differently: Take 25 mg by mouth at bedtime.  09/19/18  Yes Vicie Mutters, PA-C  TRELEGY ELLIPTA 100-62.5-25 MCG/INH AEPB Inhale 1 puff into the lungs daily.  05/04/18  Yes [provider]    Family History Family History  Problem Relation Age of Onset  . Cancer Sister        pt unaware of what kind  . High  blood pressure Mother   . COPD Mother   . Heart attack Father     Social History Social History   Tobacco Use  . Smoking status: Never Smoker  . Smokeless tobacco: Never Used  Substance Use Topics  . Alcohol use: No  . Drug use: No     Allergies   Demerol  [meperidine hcl]; Ertapenem; Codeine; Morphine and related; Other; and Sulfa antibiotics   Review of Systems Review of Systems  Constitutional: Negative for chills and fever.       Feels bad  HENT: Negative for ear pain and sore throat.   Eyes: Negative for pain and visual disturbance.  Respiratory:       Pt denies sob, but family notes increased work of breathing and wet cough  Cardiovascular: Positive for leg swelling. Negative for chest pain.  Gastrointestinal: Negative for abdominal pain, blood in stool, constipation, diarrhea, nausea and vomiting.  Genitourinary: Negative for dysuria and hematuria.  Musculoskeletal: Negative for back pain.  Skin: Negative for color change and rash.  Neurological: Positive for weakness (generalized). Negative for dizziness, light-headedness, numbness and headaches.  All other systems reviewed and are negative.    Physical Exam Updated Vital Signs BP (!) 112/45   Pulse 84   Temp 97.8 F (36.6 C) (Oral)   Resp 17   SpO2 93%   Physical Exam Vitals signs and nursing note reviewed.  Constitutional:      General: She is not in acute distress.    Appearance: She is well-developed.     Comments: Chronically ill appearing  HENT:     Head: Normocephalic and atraumatic.  Eyes:     Conjunctiva/sclera: Conjunctivae normal.  Neck:     Musculoskeletal: Neck supple.  Cardiovascular:     Rate and Rhythm: Normal rate and regular rhythm.     Pulses: Normal pulses.     Heart sounds: No murmur.  Pulmonary:     Effort: Pulmonary effort is normal. No respiratory distress.     Breath sounds: Examination of the right-middle field reveals rales. Examination of the left-middle field  reveals rales. Examination of the right-lower field reveals rales. Examination of the left-lower field reveals rales. Wheezing and rales present.     Comments: Rare wheeze to anterior lung fields Abdominal:     Palpations: Abdomen is soft.     Tenderness: There is no abdominal tenderness.  Musculoskeletal:     Comments: Trace ble edema, no calf ttp  Skin:    General: Skin is warm and dry.  Neurological:     Mental Status: She is alert.     Comments: Clear speech, no facial droop, moving all extremities      ED Treatments / Results  Labs (all labs ordered are listed, but only abnormal results are displayed) Labs Reviewed  CBC WITH DIFFERENTIAL/PLATELET - Abnormal; Notable for the following components:      Result Value   MCH 25.8 (*)    MCHC 29.8 (*)    All other components within normal limits  COMPREHENSIVE METABOLIC PANEL - Abnormal; Notable for the following components:   Glucose, Bld 123 (*)    BUN 26 (*)    Calcium 8.3 (*)    Total Protein 6.3 (*)    Albumin 3.3 (*)    GFR calc non Af Amer 55 (*)    All other components within normal limits  BRAIN NATRIURETIC PEPTIDE - Abnormal; Notable for the following components:   B Natriuretic Peptide 273.0 (*)    All other components within normal limits  URINALYSIS, ROUTINE W REFLEX MICROSCOPIC - Abnormal; Notable for the following components:   Color, Urine STRAW (*)    All other components within normal limits  TROPONIN I    EKG EKG Interpretation  Date/Time:  Monday March 20 2019 17:24:52 EDT Ventricular Rate:  70 PR Interval:    QRS Duration: 84 QT Interval:  430 QTC Calculation: 464 R Axis:   -36 Text Interpretation:  Sinus rhythm Atrial premature complex Inferior infarct, old Anterior infarct, old Lateral leads are also involved Baseline wander in lead(s) V3 V6 Confirmed by Gerlene Fee [phone]) on 03/20/2019 7:18:55 PM   Radiology Dg Chest Portable 1 View  Result Date: 03/20/2019 CLINICAL DATA:  Shortness of  breath. EXAM: PORTABLE CHEST 1 VIEW COMPARISON:  12/01/2018 FINDINGS: There is mild volume overload without overt pulmonary edema. The lung volumes are low. There are small bilateral pleural effusions with adjacent atelectasis. Pleuroparenchymal scarring is noted at the lung apices. The heart size is stable from prior study. IMPRESSION: Volume overload without overt pulmonary edema. There are trace bilateral pleural effusions and bibasilar atelectasis. Electronically Signed   By: Constance Holster M.D.   On: 03/20/2019 18:57    Procedures Procedures (including critical care time)  Medications Ordered in ED Medications  albuterol (VENTOLIN HFA) 108 (90 Base) MCG/ACT inhaler 4 puff (4 puffs Inhalation Given 03/20/19 1811)  furosemide (LASIX) injection 40 mg (40 mg Intravenous Given 03/20/19 1923)     Initial Impression / Assessment and Plan / ED Course  I have reviewed the triage vital signs and the nursing notes.  Pertinent labs & imaging results that were available during my care of the patient were reviewed by me and considered in my medical decision making (see chart for details).     Final Clinical Impressions(s) / ED Diagnoses   Final diagnoses:  Acute on chronic congestive heart failure, unspecified heart failure type ([place])   83 y/o female presenting to the ED with vague complaint of not feeling well for 2 days. Daughter at bedside reports increased work of  breathing and wet cough for several days.   Pt was initially placed on supplemental O2 however she was weened from O2 and sats remained WNL. Her VS are otherwise WNL.  CBC without leukocytosis or anemia.  CMP with elevate BUN at 26, consistent with prior. Normal Cr. Normal LFTs. No gross electrolyte derangement.  Trop negative. BNP elevated at 273.  UA negative  EKG with NSR with PACs, Inferior infarct, old Anterior infarct, old Lateral leads are also involved, Baseline wander in lead(s) V3 V6    CXR with volume overload  without overt pulmonary edema. There are trace bilateral pleural effusions and bibasilar atelectasis.  Pt was given a dose of lasix in the ED and had >500cc of output. Suspect her sxs are related to a mild acute exacerbation of her chronic heart failure. She is not requiring oxygen and she is in no overt distress in the ED. Her workup is otherwise reassuring. Dr. Sedonia Small evaluated pt and dicussed possible admission vs d/c with close pcp f/u. Family is comfortable with plan for d/c. Advised to return to the ED for new or worsening symptoms in the meantime.   ED Discharge Orders    None       Bishop [place] 03/20/19 2134    Maudie Flakes, MD 03/25/19 [phone]

## 2019-03-22 ENCOUNTER — Encounter: Payer: Self-pay | Admitting: Adult Health

## 2019-03-22 ENCOUNTER — Ambulatory Visit (INDEPENDENT_AMBULATORY_CARE_PROVIDER_SITE_OTHER): Payer: Medicare Other | Admitting: Adult Health

## 2019-03-22 ENCOUNTER — Other Ambulatory Visit: Payer: Self-pay

## 2019-03-22 VITALS — BP 110/68 | HR 47 | Temp 97.5°F | Wt 146.0 lb

## 2019-03-22 DIAGNOSIS — I10 Essential (primary) hypertension: Secondary | ICD-10-CM | POA: Diagnosis not present

## 2019-03-22 DIAGNOSIS — Z789 Other specified health status: Secondary | ICD-10-CM

## 2019-03-22 DIAGNOSIS — J9611 Chronic respiratory failure with hypoxia: Secondary | ICD-10-CM

## 2019-03-22 DIAGNOSIS — I5032 Chronic diastolic (congestive) heart failure: Secondary | ICD-10-CM | POA: Diagnosis not present

## 2019-03-22 DIAGNOSIS — Z9981 Dependence on supplemental oxygen: Secondary | ICD-10-CM | POA: Diagnosis not present

## 2019-03-22 DIAGNOSIS — Z79899 Other long term (current) drug therapy: Secondary | ICD-10-CM

## 2019-03-22 DIAGNOSIS — I5033 Acute on chronic diastolic (congestive) heart failure: Secondary | ICD-10-CM | POA: Diagnosis not present

## 2019-03-22 NOTE — Progress Notes (Signed)
Assessment and Plan:  Marie [name] was seen today for follow-up.  Diagnoses and all orders for this visit:  Acute on chronic diastolic congestive heart failure ([place]) Appears euvolemic today; not currently weighing at home Disease process and medications discussed. Questions answered fully. - continue lasix 20 mg daily for now; will have her start weighing daily to establish baseline weight Given instructions to call if more than 2-3 lb increase overnight, or weights trending up more than 5 lb for lasix instructions (plan to increase to 40 mg for 3-5 days) Call with any - LE edema, new cough, dyspnea, PND Emphasized salt restriction, less than 2000mg  a day. Encouraged regular exercise. If any increasing severe shortness of breath, swelling, or chest pressure go to ER immediately.  decrease your fluid intake to less than 1.5-2 L daily Enrolling in CCM for close follow up, limited health literacy, hx of frequent ED/hospitalizations Will do follow up appointment in 7-10 days to discuss weights and trends at home -     [place] GFR -     Magnesium  Medication management -     COMPLETE METABOLIC PANEL WITH GFR -     Magnesium  Essential hypertension Continue medications Monitor blood pressure at home; call if consistently over 130/80 Continue DASH diet.   Reminder to go to the ER if any CP, SOB, nausea, dizziness, severe HA, changes vision/speech, left arm numbness and tingling and jaw pain.  Chronic respiratory failure with hypoxia, on home O2 therapy (HCC) Stable; continue inhalers, home O2 PRN  Enrolled in chronic care management  Comprehensive care plan:  Patient/caregiver was given comprehensive care plan We will continue to monitor these goals every 3 months with an office visit and every month by a telephone call  Patient can contact the office any time with the phone number and get to a provider via the answering service or they can use Mychart.   Verbal  permission was received from the patient to review comprehensive care management, they understand they have the right to stop CCM services at any time.   The patient is self managing medications at home.  Medications were reviewed with the patient today as well as adherence and potential interactions.   The patient does not need home health services at this time.    Further disposition pending results of labs. Discussed med's effects and SE's.   Over 30 minutes of exam, counseling, chart review, and critical decision making was performed.   Future Appointments  Date Time Provider [place]  04/07/2019 10:50 AM Narda Amber K, DO [facility] None  06/06/2019  2:00 PM [name], MD [facility] None  11/20/2019 11:15 AM Vicie Mutters, PA-C [facility] None    ------------------------------------------------------------------------------------------------------------------   HPI BP 110/68   Pulse (!) 47   Temp (!) 97.5 F (36.4 C)   Wt 146 lb (66.2 kg)   SpO2 97%   BMI 25.06 kg/m   83 y.o.female with hx of D-CHF, LVH, htn, subvalvular aortic stenosis (followed by cards), chronic respiratory failure attributed mainly to Parkinson's plus syndrome, limited health literacy with history of frequent hospitalizations presents for ED follow up;   she presented to ED on 03/20/2019 with vague complaint of not feeling well for 2 days. Daughter at bedside reported increased work of breathing and wet cough for several days. labs and EKG were overall unremarkable excepting BNP elevated at 273, CXR suggested volume overload without over pulm edema. Trace bilateral pleural effusions and bibasilar atelectasis.   Pt  was given a dose of lasix in the ED and had >500cc of output. Suspect her sxs are related to a mild acute exacerbation of her chronic heart failure. Her sx improved dramatically and she was discharged to family care with instructions for close follow up.   Today she presents  appearing well, at her known baseline. She is accompanied by her husband and daughter who is heavily involved in care management due to limited health literacy of parents.   She has a history of Diastolic CHF, denies dyspnea on exertion, orthopnea, paroxysmal nocturnal dyspnea and edema. Positive for none. She admits she has not been weighing daily. She has been taking 20 mg lasix daily (consistent with previous and well controlled on this dose typically).  Wt Readings from Last 3 Encounters:  03/22/19 146 lb (66.2 kg)  03/03/19 140 lb (63.5 kg)  01/24/19 151 lb (68.5 kg)   She admits drinks coffee and chocolate milk; she estimates 16-20 ounces of coffee and similar quantity. Reviewed high sodium foods today; admits to fried chicken takeout 2-3 times per week sometimes. Daughter feels this is major source of sodium in her diet.    Past Medical History:  Diagnosis Date  . Balance disorder 2008.     Falls a lot.  She can be standing and then leans too far over to one side   . Bulging disc   . Chronic bronchitis ([place])    due to congestion at times, on prednisone and advair  . COPD (chronic obstructive pulmonary disease) ([place])   . COPD with acute exacerbation ([place]) 12/15/2017  . Depression    many years ago  . GERD (gastroesophageal reflux disease)   . Hearing loss    mild  . Hyperlipidemia   . Hypertension   . Iatrogenic adrenal insufficiency ([place])   . Incontinence    not indicated at this visit.  Marland Kitchen LVH (left ventricular hypertrophy) due to hypertensive disease    a. 02/2017: echo showing an EF of 65-70% with moderate LVH, hyperdynamic LV with subvalvular gradient of 70 mmHg, SAM, and mild MR  . Neuropathy    legs stay numb   . Osteoarthritis    hands,   . Osteoporosis   . Shortness of breath dyspnea   . Tubulovillous adenoma of rectum   . Wears dentures   . Wears glasses      Allergies  Allergen Reactions  . Demerol  [Meperidine Hcl] Other (See Comments)    Pt. Feels like  she is suffocating  . Ertapenem Hives    Caused whole body to turn red Other reaction(s): Redness Caused whole body to turn red  . Codeine Other (See Comments)    hallucinations  . Morphine And Related Other (See Comments)    hallucinations  . Other Other (See Comments)    Invan 7- pt became red all over  . Sulfa Antibiotics Rash    Current Outpatient Medications on File Prior to Visit  Medication Sig  . albuterol (PROVENTIL) (2.5 MG/3ML) 0.083% nebulizer solution Use 1 ampule in nebulizer 4 times a day or every 4 hours as needed to rescue asthma. (Patient taking differently: Take 2.5 mg by nebulization See admin instructions. Use 1 ampule in nebulizer 4 times a day or every 4 hours as needed to rescue asthma.)  . ALPRAZolam (XANAX) 0.5 MG tablet Take 1/2-1 tablet 2 - 3 x /day ONLY if needed for Anxiety Attack &  limit to 5 days /week to avoid addiction (Patient taking differently:  Take 0.5 mg by mouth 2 (two) times daily. )  . Ascorbic Acid (VITAMIN C) 1000 MG tablet Take 1,000 mg by mouth daily.  Marland Kitchen aspirin 81 MG tablet Take 81 mg by mouth daily. Buys OTC  . carbidopa-levodopa (SINEMET IR) 25-100 MG tablet Take 2 tablet at 9am and 2 tablets at 3pm. (Patient taking differently: Take 2 tablets by mouth 2 (two) times daily. Take 2 tablet at 9am and 2 tablets at 3pm.)  . cetirizine (ZYRTEC) 10 MG tablet Take 10 mg by mouth daily.  . Cholecalciferol (VITAMIN D) 2000 units CAPS Take 2 capsules (4,000 Units total) by mouth daily. (Patient taking differently: Take 2,000 Units by mouth daily. )  . furosemide (LASIX) 20 MG tablet Increase lasix for 40 mg (2 tabs) daily for 3-5 days until swelling resolves, then resume 20 mg (1 tab daily). (Patient taking differently: Take 20 mg by mouth daily. Increase lasix for 40 mg (2 tabs) daily for 3-5 days until swelling resolves, then resume 20 mg (1 tab daily).)  . metoprolol tartrate (LOPRESSOR) 25 MG tablet Take 0.5 tablets (12.5 mg total) by mouth 2 (two)  times daily.  Marland Kitchen omeprazole (PRILOSEC) 40 MG capsule Take 1 capsule (40 mg total) by mouth daily.  . OXYGEN Inhale 2 L into the lungs daily as needed (for shortness of breath). Setting is on 2 when needed   . potassium chloride SA (K-DUR,KLOR-CON) 20 MEQ tablet Take 1 tablet by mouth daily.  . predniSONE (DELTASONE) 10 MG tablet Take 1 tablet 3 x /day or as directed (Patient taking differently: Take 10 mg by mouth 2 (two) times a day. )  . sertraline (ZOLOFT) 25 MG tablet Take 1 tablet (25 mg total) by mouth daily. (Patient taking differently: Take 25 mg by mouth at bedtime. )  . TRELEGY ELLIPTA 100-62.5-25 MCG/INH AEPB Inhale 1 puff into the lungs daily.   . Zinc 50 MG TABS Take by mouth daily.   No current facility-administered medications on file prior to visit.     ROS: all negative except above.   Physical Exam:  BP 110/68   Pulse (!) 47   Temp (!) 97.5 F (36.4 C)   Wt 146 lb (66.2 kg)   SpO2 97%   BMI 25.06 kg/m   General Appearance: Well nourished, in no apparent distress. Eyes: PERRLA, conjunctiva no swelling or erythema ENT/Mouth: Ext aud canals clear, TMs without erythema, bulging. No erythema, swelling, or exudate on post pharynx.  Tonsils not swollen or erythematous. Hearing fair Neck: Supple, thyroid normal.  Respiratory: Respiratory effort normal, BS equal bilaterally without rales, rhonchi, wheezing or stridor.  Cardio: RRR with harsh 4/6 blowing murmur without radiation over aortic area. Brisk peripheral pulses without edema.  Abdomen: Soft, + BS.  Non tender, no guarding, rebound, hernias, masses. Lymphatics: Non tender without lymphadenopathy.  Musculoskeletal: in wheelchair, strenght weak but symmetrical, gait not evaluated Skin: Warm, dry without rashes, lesions, ecchymosis.  Neuro: Cranial nerves intact. Sensation intact.  Psych: Awake and oriented X 3, normal affect, Insight and Judgment fair    Izora Ribas, NP 12:18 PM [facility]

## 2019-03-22 NOTE — Patient Instructions (Addendum)
Goals    . DIET - REDUCE SODIUM INTAKE     Limit to 2000 mg per day; limit fried chicken to once weekly    . DIET - WATER INTAKE     Total fluid intake should be 50-60 fluid ounces daily; try to make most of this be water.     . Record weight daily     Call office if more than 2-3 lb up overnight, or if more than 5 lb gain in 1 week from baseline  Call if increasing swelling in legs, new cough, shortness of breath (particulalry when lying down at night)        We will try to establish a "baseline weight" for you - today was 146 lb - typically will be a few pounds lighter at home  Check weight daily - keep a log - if you gain more than 2-3 lb overnight, give me a call   If you are trending up - more than 5 lb up in 1 week - give me a call  If you have any swelling in your legs, new/worsening shortness of breath or cough, getting short of breath when lying down at night - call    Do the following things EVERYDAY: 1) Weigh yourself in the morning before breakfast or at the same time every day. Write it down and keep it in a log. 2) Take your medicines as prescribed 3) Eat low salt foods-Limit salt (sodium) to 2000 mg per day. Best thing to do is avoid processed foods.   4) Stay as active as you can everyday 5) Limit all fluids for the day to less than 1.5 liters (50 fluids ounces)   Call your doctor if:  Anytime you have any of the following symptoms:  1) 2 pound weight gain in 24 hours or 5 pounds in 1 week  2) shortness of breath, with or without a dry hacking cough  3) swelling in the hands, LEGs, feet or stomach  4) if you have to sleep on extra pillows at night in order to breathe. 5) after laying down at night for 20-30 mins, you wake up short of breath.   These can all be signs of fluid overload.    Heart Failure Heart failure means your heart has trouble pumping blood. This makes it hard for your body to work well. Heart failure is usually a long-term (chronic)  condition. You must take good care of yourself and follow your doctor's treatment plan. Follow these instructions at home:  Take your heart medicine as told by your doctor. ? Do not stop taking medicine unless your doctor tells you to. ? Do not skip any dose of medicine. ? Refill your medicines before they run out. ? Take other medicines only as told by your doctor or pharmacist.  Stay active if told by your doctor. The elderly and people with severe heart failure should talk with a doctor about physical activity.  Eat heart-healthy foods. Choose foods that are without trans fat and are low in saturated fat, cholesterol, and salt (sodium). This includes fresh or frozen fruits and vegetables, fish, lean meats, fat-free or low-fat dairy foods, whole grains, and high-fiber foods. Lentils and dried peas and beans (legumes) are also good choices.  Limit salt if told by your doctor.  Cook in a healthy way. Roast, grill, broil, bake, poach, steam, or stir-fry foods.  Limit fluids as told by your doctor.  Weigh yourself every morning. Do this after you  pee (urinate) and before you eat breakfast. Write down your weight to give to your doctor.  Take your blood pressure and write it down if your doctor tells you to.  Ask your doctor how to check your pulse. Check your pulse as told.  Lose weight if told by your doctor.  Stop smoking or chewing tobacco. Do not use gum or patches that help you quit without your doctor's approval.  Schedule and go to doctor visits as told.  Nonpregnant women should have no more than 1 drink a day. Men should have no more than 2 drinks a day. Talk to your doctor about drinking alcohol.  Stop illegal drug use.  Stay current with shots (immunizations).  Manage your health conditions as told by your doctor.  Learn to manage your stress.  Rest when you are tired.  If it is really hot outside: ? Avoid intense activities. ? Use air conditioning or fans, or  get in a cooler place. ? Avoid caffeine and alcohol. ? Wear loose-fitting, lightweight, and light-colored clothing.  If it is really cold outside: ? Avoid intense activities. ? Layer your clothing. ? Wear mittens or gloves, a hat, and a scarf when going outside. ? Avoid alcohol.  Learn about heart failure and get support as needed.  Get help to maintain or improve your quality of life and your ability to care for yourself as needed. Contact a doctor if:  You gain weight quickly.  You are more short of breath than usual.  You cannot do your normal activities.  You tire easily.  You cough more than normal, especially with activity.  You have any or more puffiness (swelling) in areas such as your hands, feet, ankles, or belly (abdomen).  You cannot sleep because it is hard to breathe.  You feel like your heart is beating fast (palpitations).  You get dizzy or light-headed when you stand up. Get help right away if:  You have trouble breathing.  There is a change in mental status, such as becoming less alert or not being able to focus.  You have chest pain or discomfort.  You faint. This information is not intended to replace advice given to you by your health care provider. Make sure you discuss any questions you have with your health care provider. Document Released: 07/14/2008 Document Revised: 03/12/2016 Document Reviewed: 11/21/2012 Elsevier Interactive Patient Education  2017 [employer].

## 2019-03-23 DIAGNOSIS — I69391 Dysphagia following cerebral infarction: Secondary | ICD-10-CM | POA: Diagnosis not present

## 2019-03-23 DIAGNOSIS — I119 Hypertensive heart disease without heart failure: Secondary | ICD-10-CM | POA: Diagnosis not present

## 2019-03-23 DIAGNOSIS — R1312 Dysphagia, oropharyngeal phase: Secondary | ICD-10-CM | POA: Diagnosis not present

## 2019-03-23 DIAGNOSIS — G20 Parkinson's disease: Secondary | ICD-10-CM | POA: Diagnosis not present

## 2019-03-23 DIAGNOSIS — F0280 Dementia in other diseases classified elsewhere without behavioral disturbance: Secondary | ICD-10-CM | POA: Diagnosis not present

## 2019-03-23 DIAGNOSIS — J441 Chronic obstructive pulmonary disease with (acute) exacerbation: Secondary | ICD-10-CM | POA: Diagnosis not present

## 2019-03-23 LAB — COMPLETE METABOLIC PANEL WITH GFR
AG Ratio: 1.4 (calc) (ref 1.0–2.5)
ALT: 19 U/L (ref 6–29)
AST: 16 U/L (ref 10–35)
Albumin: 3.8 g/dL (ref 3.6–5.1)
Alkaline phosphatase (APISO): 54 U/L (ref 37–153)
BUN/Creatinine Ratio: 32 (calc) — ABNORMAL HIGH (ref 6–22)
BUN: 33 mg/dL — ABNORMAL HIGH (ref 7–25)
CO2: 31 mmol/L (ref 20–32)
Calcium: 9.2 mg/dL (ref 8.6–10.4)
Chloride: 105 mmol/L (ref 98–110)
Creat: 1.03 mg/dL — ABNORMAL HIGH (ref 0.60–0.88)
GFR, Est African American: 59 mL/min/{1.73_m2} — ABNORMAL LOW (ref >=60)
GFR, Est Non African American: 51 mL/min/{1.73_m2} — ABNORMAL LOW (ref >=60)
Globulin: 2.7 g/dL (calc) (ref 1.9–3.7)
Glucose, Bld: 87 mg/dL (ref 65–99)
Potassium: 4.5 mmol/L (ref 3.5–5.3)
Sodium: 144 mmol/L (ref 135–146)
Total Bilirubin: 0.6 mg/dL (ref 0.2–1.2)
Total Protein: 6.5 g/dL (ref 6.1–8.1)

## 2019-03-23 LAB — MAGNESIUM: Magnesium: 2.2 mg/dL (ref 1.5–2.5)

## 2019-03-27 DIAGNOSIS — G20 Parkinson's disease: Secondary | ICD-10-CM | POA: Diagnosis not present

## 2019-03-27 DIAGNOSIS — R1312 Dysphagia, oropharyngeal phase: Secondary | ICD-10-CM | POA: Diagnosis not present

## 2019-03-27 DIAGNOSIS — I119 Hypertensive heart disease without heart failure: Secondary | ICD-10-CM | POA: Diagnosis not present

## 2019-03-27 DIAGNOSIS — J441 Chronic obstructive pulmonary disease with (acute) exacerbation: Secondary | ICD-10-CM | POA: Diagnosis not present

## 2019-03-27 DIAGNOSIS — F0280 Dementia in other diseases classified elsewhere without behavioral disturbance: Secondary | ICD-10-CM | POA: Diagnosis not present

## 2019-03-27 DIAGNOSIS — I69391 Dysphagia following cerebral infarction: Secondary | ICD-10-CM | POA: Diagnosis not present

## 2019-03-28 DIAGNOSIS — I119 Hypertensive heart disease without heart failure: Secondary | ICD-10-CM | POA: Diagnosis not present

## 2019-03-28 DIAGNOSIS — J441 Chronic obstructive pulmonary disease with (acute) exacerbation: Secondary | ICD-10-CM | POA: Diagnosis not present

## 2019-03-28 DIAGNOSIS — G20 Parkinson's disease: Secondary | ICD-10-CM | POA: Diagnosis not present

## 2019-03-28 DIAGNOSIS — F0280 Dementia in other diseases classified elsewhere without behavioral disturbance: Secondary | ICD-10-CM | POA: Diagnosis not present

## 2019-03-28 DIAGNOSIS — I69391 Dysphagia following cerebral infarction: Secondary | ICD-10-CM | POA: Diagnosis not present

## 2019-03-28 DIAGNOSIS — R1312 Dysphagia, oropharyngeal phase: Secondary | ICD-10-CM | POA: Diagnosis not present

## 2019-03-29 NOTE — Progress Notes (Signed)
Virtual Visit via Telephone Note  I connected with Marie [name] on 03/29/19 at  4:15 PM EDT by telephone and verified that I am speaking with the correct person using two identifiers.  Location: Patient: home Provider: [place] office    I discussed the limitations, risks, security and privacy concerns of performing an evaluation and management service by telephone and the availability of in person appointments. I also discussed with the patient that there may be a patient responsible charge related to this service. The patient expressed understanding and agreed to proceed.   History of Present Illness:  BP 119/68   Pulse 83   Resp 16   Wt 145 lb (65.8 kg)   SpO2 96%   BMI 24.89 kg/m   83 y.o. female, close follow up for recent acute on chronic CHF that was successfully treated and discharged after IV lasix, to establish basline weight, emphasize CHF management principles. She also has Restrictive Lung Dz and Parkinson's (+) with mild Dementia and limited insight has has numerous ER visits for acute dyspnea compounding her moderately severe Restrictive Lung Disease when she panics and presents to the ER's. It has been long suspected that there is a large component of poor compliance in medication regimen of her inhalent bronchodilator therapy and husband likewise seems oblivious to her med dosing schedules and of little her in assessing her compliance. Recently daughter has been helping and attending OVs with her with some improved compliance, and has home health/sitting with her 9-5 pm and doing well. CMET at last visit showed CKD III consistent with her baseline and no electrolyte abnormalities. She resumed 20 mg daily baseline lasix dose and calls today for follow up as she was not previously weighing daily.   Today she reports weight has been stable at 143 lb all week, today was up to 145 lb, but no edema, no cough, no dyspnea. Patient feels she is doing quite well. Advised if weights  trend up past 148 lb over the weekend to increase to 40 mg lasix for 3 days.   Daughter is on the phone with her today and shares concerns with occasional days (every 2 weeks or so) where patient becomes very agitated and argumentative, and get very worked up. Will have resolved the following day.  She is currently on zoloft 25 mg daily. She is also apparently taking xanax 0.5 mg BID daily rather than PRN.   She has a history of Diastolic CHF, denies dyspnea on exertion, orthopnea, paroxysmal nocturnal dyspnea and edema. Positive for none. Wt Readings from Last 3 Encounters:  03/22/19 146 lb (66.2 kg)  03/03/19 140 lb (63.5 kg)  01/24/19 151 lb (68.5 kg)    Observations/Objective:  General : Well sounding patient in no apparent distress HEENT: no hoarseness, no cough for duration of visit Lungs: speaks in complete sentences, no audible wheezing, no apparent distress Neurological: alert, oriented x 3 Psychiatric: pleasant, judgement poor   Assessment and Plan:  Jakari was seen today for follow-up.  Diagnoses and all orders for this visit:  Chronic diastolic CHF (congestive heart failure) ([place]) Disease process and medications discussed. Questions answered fully. Emphasized salt restriction, less than 2000mg  a day. Encouraged daily monitoring of the patient's weight, call office if 5 lb weight loss or gain in a day, of if weights trending above 148 lb; goal baseline weight 143 lb or so Encouraged regular exercise. If any increasing shortness of breath, swelling, or chest pressure go to ER immediately.  decrease your  fluid intake to less than 2 L daily please remember to always increase your potassium intake with any increase of your fluid pill.  -     furosemide (LASIX) 20 MG tablet; Take 1 tab (20 mg daily); if weight trending up above 148 lb with swelling or shortness of breath increase lasix for 40 mg (2 tabs) daily for 3-5 days until swelling  resolves  Anxiety/Agitation Increase zoloft; slowly taper down on daily xanax until using PRN Follow up as schedule with Dr. Melford Aase; can taper up further on zoloft if needed to 75 ot 100 mg daily -     sertraline (ZOLOFT) 50 MG tablet; Take 0.5 tablets (25 mg total) by mouth daily.  Follow Up Instructions:    I discussed the assessment and treatment plan with the patient. The patient was provided an opportunity to ask questions and all were answered. The patient agreed with the plan and demonstrated an understanding of the instructions.   The patient was advised to call back or seek an in-person evaluation if the symptoms worsen or if the condition fails to improve as anticipated.  I provided 15 minutes of non-face-to-face time during this encounter.  Future Appointments  Date Time Provider [place]  03/30/2019  4:15 PM Marie Comber, NP [facility] None  04/07/2019 10:50 AM Marie Amber K, DO [facility] None  06/06/2019  2:00 PM Marie Pinto, MD [facility] None  11/20/2019 11:15 AM Marie Mutters, PA-C [facility] None    Izora Ribas, NP

## 2019-03-30 ENCOUNTER — Ambulatory Visit: Payer: Medicare Other | Admitting: Adult Health

## 2019-03-30 ENCOUNTER — Other Ambulatory Visit: Payer: Self-pay

## 2019-03-30 ENCOUNTER — Encounter: Payer: Self-pay | Admitting: Adult Health

## 2019-03-30 VITALS — BP 119/68 | HR 83 | Resp 16 | Wt 145.0 lb

## 2019-03-30 DIAGNOSIS — I5032 Chronic diastolic (congestive) heart failure: Secondary | ICD-10-CM | POA: Diagnosis not present

## 2019-03-30 DIAGNOSIS — R451 Restlessness and agitation: Secondary | ICD-10-CM | POA: Diagnosis not present

## 2019-03-30 DIAGNOSIS — F419 Anxiety disorder, unspecified: Secondary | ICD-10-CM | POA: Diagnosis not present

## 2019-03-30 MED ORDER — SERTRALINE HCL 50 MG PO TABS: 25.0000 mg | ORAL_TABLET | Freq: Every day | ORAL | 1 refills | Status: DC

## 2019-03-30 MED ORDER — FUROSEMIDE 20 MG PO TABS: ORAL_TABLET | ORAL | 1 refills | Status: DC

## 2019-03-31 DIAGNOSIS — I119 Hypertensive heart disease without heart failure: Secondary | ICD-10-CM | POA: Diagnosis not present

## 2019-03-31 DIAGNOSIS — F0280 Dementia in other diseases classified elsewhere without behavioral disturbance: Secondary | ICD-10-CM | POA: Diagnosis not present

## 2019-03-31 DIAGNOSIS — R1312 Dysphagia, oropharyngeal phase: Secondary | ICD-10-CM | POA: Diagnosis not present

## 2019-03-31 DIAGNOSIS — J441 Chronic obstructive pulmonary disease with (acute) exacerbation: Secondary | ICD-10-CM | POA: Diagnosis not present

## 2019-03-31 DIAGNOSIS — G20 Parkinson's disease: Secondary | ICD-10-CM | POA: Diagnosis not present

## 2019-03-31 DIAGNOSIS — I69391 Dysphagia following cerebral infarction: Secondary | ICD-10-CM | POA: Diagnosis not present

## 2019-04-05 ENCOUNTER — Ambulatory Visit: Payer: Medicare Other | Admitting: Adult Health

## 2019-04-05 ENCOUNTER — Other Ambulatory Visit: Payer: Self-pay

## 2019-04-05 ENCOUNTER — Encounter: Payer: Self-pay | Admitting: Adult Health

## 2019-04-05 DIAGNOSIS — M549 Dorsalgia, unspecified: Secondary | ICD-10-CM

## 2019-04-05 MED ORDER — MELOXICAM 15 MG PO TABS: ORAL_TABLET | ORAL | 0 refills | Status: DC

## 2019-04-05 NOTE — Progress Notes (Signed)
Virtual Visit via Telephone Note  I connected with Marie [name] on 04/05/19 at  4:00 PM EDT by telephone and verified that I am speaking with the correct person using two identifiers.  Location: Patient: home Provider: daughter   I discussed the limitations, risks, security and privacy concerns of performing an evaluation and management service by telephone and the availability of in person appointments. I also discussed with the patient that there may be a patient responsible charge related to this service. The patient expressed understanding and agreed to proceed.   History of Present Illness:  There were no vitals taken for this visit.  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 (accompanied by her daughter). Reports current episode began 6 days ago, constant but waxing and waning, 6/10 currently, non-radiating. Has had this pain for quite some time intermittently for several months to years, just worse in the last 24-36 hours. Typically improves with 500 mg tylenol BID, hasn't tried higher doses.   Daughter palpates in area and reports non-tender over spine, no point bony tenderness, reporting generalized discomfort. Hasn't tried heat or ice. Patient is notably kyphotic at baseline per previous exams.   Recent CXR from 11/2018 and 03/2019 reviewed; no obvious/notably concerning bony abnormalities to spine. Denies falls or trauma recently. Denies chest pain, dyspnea, numbness/tingling, burning pain.   Past Medical History:  Diagnosis Date  . Balance disorder 2008.     Falls a lot.  She can be standing and then leans too far over to one side   . Bulging disc   . Chronic bronchitis ([place])    due to congestion at times, on prednisone and advair  . COPD (chronic obstructive pulmonary disease) ([place])   . COPD with acute  exacerbation ([place]) 12/15/2017  . Depression    many years ago  . GERD (gastroesophageal reflux disease)   . Hearing loss    mild  . Hyperlipidemia   . Hypertension   . Iatrogenic adrenal insufficiency ([place])   . Incontinence    not indicated at this visit.  Marland Kitchen LVH (left ventricular hypertrophy) due to hypertensive disease    a. 02/2017: echo showing an EF of 65-70% with moderate LVH, hyperdynamic LV with subvalvular gradient of 70 mmHg, SAM, and mild MR  . Neuropathy    legs stay numb   . Osteoarthritis    hands,   . Osteoporosis   . Shortness of breath dyspnea   . Tubulovillous adenoma of rectum   . Wears dentures   . Wears glasses    Allergies  Allergen Reactions  . Demerol  [Meperidine Hcl] Other (See Comments)    Pt. Feels like she is suffocating  . Ertapenem Hives    Caused whole body to turn red Other reaction(s): Redness Caused whole body to turn red  . Codeine Other (See Comments)    hallucinations  . Morphine And Related Other (See Comments)    hallucinations  . Other Other (See Comments)    Invan 7- pt became red all over  . Sulfa Antibiotics Rash     Observations/Objective:  General : Well sounding patient in no apparent distress HEENT: no hoarseness, no cough for duration of visit Lungs: speaks in complete sentences, no audible wheezing, no apparent distress Neurological: alert, oriented x 3 Psychiatric: pleasant, judgement appropriate    Assessment and Plan:  Marie [name] was seen today for back pain.  Diagnoses and all orders for this visit:  Upper back pain on left side Cautioned limited ability to diagnose without in office visit; chronic ongoing; worse in last 1-2 days Has CXR without obvious abnormality recently obtained; no other concerning sx Can try heat application, gentle massage Increase tylenol up to 1000 mg TID/QID PRN Alternately can try mobic; risks and SE reviewed; limit use; take with food Follow up if not improving -     meloxicam  (MOBIC) 15 MG tablet; Take one daily with food for 1-2 weeks, can take with tylenol, can not take with aleve, iburpofen, then take 1/2-1 tab as needed daily for pain   Follow Up Instructions:    I discussed the assessment and treatment plan with the patient. The patient was provided an opportunity to ask questions and all were answered. The patient agreed with the plan and demonstrated an understanding of the instructions.   The patient was advised to call back or seek an in-person evaluation if the symptoms worsen or if the condition fails to improve as anticipated.  I provided 15 minutes of non-face-to-face time during this encounter.   Izora Ribas, NP

## 2019-04-06 DIAGNOSIS — I119 Hypertensive heart disease without heart failure: Secondary | ICD-10-CM | POA: Diagnosis not present

## 2019-04-06 DIAGNOSIS — G20 Parkinson's disease: Secondary | ICD-10-CM | POA: Diagnosis not present

## 2019-04-06 DIAGNOSIS — R1312 Dysphagia, oropharyngeal phase: Secondary | ICD-10-CM | POA: Diagnosis not present

## 2019-04-06 DIAGNOSIS — F0280 Dementia in other diseases classified elsewhere without behavioral disturbance: Secondary | ICD-10-CM | POA: Diagnosis not present

## 2019-04-06 DIAGNOSIS — J441 Chronic obstructive pulmonary disease with (acute) exacerbation: Secondary | ICD-10-CM | POA: Diagnosis not present

## 2019-04-06 DIAGNOSIS — I69391 Dysphagia following cerebral infarction: Secondary | ICD-10-CM | POA: Diagnosis not present

## 2019-04-07 ENCOUNTER — Other Ambulatory Visit: Payer: Self-pay

## 2019-04-07 ENCOUNTER — Telehealth (INDEPENDENT_AMBULATORY_CARE_PROVIDER_SITE_OTHER): Payer: Medicare Other | Admitting: Neurology

## 2019-04-07 VITALS — Ht 64.5 in | Wt 130.0 lb

## 2019-04-07 DIAGNOSIS — M21372 Foot drop, left foot: Secondary | ICD-10-CM

## 2019-04-07 DIAGNOSIS — G20 Parkinson's disease: Secondary | ICD-10-CM | POA: Diagnosis not present

## 2019-04-07 DIAGNOSIS — F0280 Dementia in other diseases classified elsewhere without behavioral disturbance: Secondary | ICD-10-CM | POA: Diagnosis not present

## 2019-04-07 DIAGNOSIS — I69391 Dysphagia following cerebral infarction: Secondary | ICD-10-CM | POA: Diagnosis not present

## 2019-04-07 DIAGNOSIS — G231 Progressive supranuclear ophthalmoplegia [Steele-Richardson-Olszewski]: Secondary | ICD-10-CM | POA: Diagnosis not present

## 2019-04-07 DIAGNOSIS — R471 Dysarthria and anarthria: Secondary | ICD-10-CM

## 2019-04-07 DIAGNOSIS — I119 Hypertensive heart disease without heart failure: Secondary | ICD-10-CM | POA: Diagnosis not present

## 2019-04-07 DIAGNOSIS — R1312 Dysphagia, oropharyngeal phase: Secondary | ICD-10-CM | POA: Diagnosis not present

## 2019-04-07 DIAGNOSIS — J441 Chronic obstructive pulmonary disease with (acute) exacerbation: Secondary | ICD-10-CM | POA: Diagnosis not present

## 2019-04-07 MED ORDER — CARBIDOPA-LEVODOPA 25-100 MG PO TABS: ORAL_TABLET | ORAL | 3 refills | Status: DC

## 2019-04-07 NOTE — Progress Notes (Signed)
Virtual Visit via Telephone Note The purpose of this virtual visit is to provide medical care while limiting exposure to the novel coronavirus.    Consent was obtained for phone visit:  Yes.   Answered questions that patient had about telehealth interaction:  Yes.   I discussed the limitations, risks, security and privacy concerns of performing an evaluation and management service by telephone. I also discussed with the patient that there may be a patient responsible charge related to this service. The patient expressed understanding and agreed to proceed.  Pt location: Home Physician Location: office Name of referring provider:  [name], MD I connected with .Mathis Bud Mcquaig at patients initiation/request on 04/07/2019 at 10:50 AM EDT by telephone and verified that I am speaking with the correct person using two identifiers.  Pt MRN:  [id number] Pt DOB:  April 29, 1936   History of Present Illness: This is an 83 year-old female returning for progressive supranuclear palsy, a Parkinson-plus syndrome.  Since her last visit, she was getting home physical therapy and was doing relatively well.  She continues to walk with a walker and has not suffered any falls, however, her daughter feels that she is more sedentary and not as active as she previously was.  They have noticed new right knee buckling and they feel that this may be due to her compensating for left foot weakness.  She requires assistance for all ADLs, except for feeding.  Her mood is good.  She denies right knee pain or swelling.   No problems with swallowing.  Tremors are fairly well-controlled, but symptoms interfere with eating.  She remains on Sinemet 2 tablets twice daily, but again, they admit that medication compliance is sometimes an issue.   Assessment and Plan:   1.  Advanced progressive supranuclear palsy, Parkinson plus syndrome.  She is relatively stable over her last visit, but continues to be dependent for nearly  all of her ADLs, except for feeding.  She has husband, daughter, and a caregiver to assist.  Daughter has noticed that her walking is reduced over the past several months.  This is due to the progression of disease as well as her left foot weakness.  Fortunately, she has not had any falls.  We discussed that we need to find a balance between her staying active and being safe doing this.  I have encouraged her to start doing home exercises as directed by physical therapy as well as chair exercises.  I do not want her to overexert herself due to her high risk for falls and already weak left foot.  She will be continued on Sinemet 25/100 2 tablets twice daily at 9 AM and 3 PM, medication compliance was encouraged. Daughter also had questions about hand-assisted devices for tremors, and I suggested using weighted gloves and/or utensils.  2.  Left foot weakness, most likely due to L5 radiculopathy.  At her previous visit and today she does not want any aggressive therapies, such as surgery.  Therefore, it was mutually decided not to pursue MRI lumbar spine, as it would not change management.  She has already completed physical therapy with limited benefit.  I will refer her to [facility] for left AFO    Follow Up Instructions:   I discussed the assessment and treatment plan with the patient. The patient was provided an opportunity to ask questions and all were answered. The patient agreed with the plan and demonstrated an understanding of the instructions.   The patient was  advised to call back or seek an in-person evaluation if the symptoms worsen or if the condition fails to improve as anticipated.    Total Time spent in visit with the patient was:  30 min, of which 100% of the time was spent in counseling and/or coordinating care.   Pt understands and agrees with the plan of care outlined.     Alda Berthold, DO

## 2019-04-10 DIAGNOSIS — G20 Parkinson's disease: Secondary | ICD-10-CM | POA: Diagnosis not present

## 2019-04-10 DIAGNOSIS — I119 Hypertensive heart disease without heart failure: Secondary | ICD-10-CM | POA: Diagnosis not present

## 2019-04-10 DIAGNOSIS — R1312 Dysphagia, oropharyngeal phase: Secondary | ICD-10-CM | POA: Diagnosis not present

## 2019-04-10 DIAGNOSIS — I69391 Dysphagia following cerebral infarction: Secondary | ICD-10-CM | POA: Diagnosis not present

## 2019-04-10 DIAGNOSIS — J441 Chronic obstructive pulmonary disease with (acute) exacerbation: Secondary | ICD-10-CM | POA: Diagnosis not present

## 2019-04-10 DIAGNOSIS — F0280 Dementia in other diseases classified elsewhere without behavioral disturbance: Secondary | ICD-10-CM | POA: Diagnosis not present

## 2019-04-11 DIAGNOSIS — F0280 Dementia in other diseases classified elsewhere without behavioral disturbance: Secondary | ICD-10-CM | POA: Diagnosis not present

## 2019-04-11 DIAGNOSIS — I69391 Dysphagia following cerebral infarction: Secondary | ICD-10-CM | POA: Diagnosis not present

## 2019-04-11 DIAGNOSIS — R1312 Dysphagia, oropharyngeal phase: Secondary | ICD-10-CM | POA: Diagnosis not present

## 2019-04-11 DIAGNOSIS — J441 Chronic obstructive pulmonary disease with (acute) exacerbation: Secondary | ICD-10-CM | POA: Diagnosis not present

## 2019-04-11 DIAGNOSIS — I119 Hypertensive heart disease without heart failure: Secondary | ICD-10-CM | POA: Diagnosis not present

## 2019-04-11 DIAGNOSIS — G20 Parkinson's disease: Secondary | ICD-10-CM | POA: Diagnosis not present

## 2019-04-12 ENCOUNTER — Telehealth: Payer: Self-pay

## 2019-04-12 NOTE — Telephone Encounter (Signed)
Left message for pt to return call x2  Prescription for [facility] mailed to pt today. Need to inform pt of this and schedule a 4 month f/u with Posey Pronto.

## 2019-04-19 ENCOUNTER — Other Ambulatory Visit: Payer: Self-pay | Admitting: Physician Assistant

## 2019-04-19 DIAGNOSIS — Z1231 Encounter for screening mammogram for malignant neoplasm of breast: Secondary | ICD-10-CM

## 2019-05-17 DIAGNOSIS — I5032 Chronic diastolic (congestive) heart failure: Secondary | ICD-10-CM | POA: Diagnosis not present

## 2019-05-17 DIAGNOSIS — I10 Essential (primary) hypertension: Secondary | ICD-10-CM | POA: Diagnosis not present

## 2019-05-17 DIAGNOSIS — J449 Chronic obstructive pulmonary disease, unspecified: Secondary | ICD-10-CM | POA: Diagnosis not present

## 2019-05-17 DIAGNOSIS — J9611 Chronic respiratory failure with hypoxia: Secondary | ICD-10-CM | POA: Diagnosis not present

## 2019-05-21 ENCOUNTER — Other Ambulatory Visit: Payer: Self-pay | Admitting: Internal Medicine

## 2019-05-21 DIAGNOSIS — I10 Essential (primary) hypertension: Secondary | ICD-10-CM

## 2019-05-21 MED ORDER — METOPROLOL TARTRATE 25 MG PO TABS: ORAL_TABLET | ORAL | 3 refills | Status: DC

## 2019-05-29 DIAGNOSIS — I5032 Chronic diastolic (congestive) heart failure: Secondary | ICD-10-CM | POA: Diagnosis not present

## 2019-05-29 DIAGNOSIS — I10 Essential (primary) hypertension: Secondary | ICD-10-CM | POA: Diagnosis not present

## 2019-05-29 DIAGNOSIS — J449 Chronic obstructive pulmonary disease, unspecified: Secondary | ICD-10-CM | POA: Diagnosis not present

## 2019-05-29 DIAGNOSIS — L03032 Cellulitis of left toe: Secondary | ICD-10-CM | POA: Diagnosis not present

## 2019-06-05 ENCOUNTER — Encounter: Payer: Self-pay | Admitting: Internal Medicine

## 2019-06-05 NOTE — Patient Instructions (Signed)

## 2019-06-05 NOTE — Progress Notes (Signed)
Comprehensive Evaluation &  Examination     This very nice 83 y.o. MWF presents for a  comprehensive evaluation and management of multiple medical co-morbidities.  Patient has been followed for HTN, HLD, Restrictive Lung Disease, Hx/o Asthma,T2_NIDDM  and Vitamin D Deficiency. Patient is on both nebulized Meds as well as MDI for her Asthmatic Chronic Bronchitis. Patient is on chronic home O2. In February she was hospitalized with acute on chronic Respiratory Failure. Her respiratory therapy has been compromised by poor compliance with her inhalent Therapy and her spouse's likewise lack of insight / comprehension with her med regimen. Today she is g brought in by her daughter, Marcie Bal , who calls every day to check on her & frequently is at her house assisting her father and a 5 day/week caregiver.      Patient is followed by Dr Posey Pronto for unstable gait / frequent falls attributed to  progressive supranuclear palsy, a Parkinson-plus syndrome with Dementia (very limited insight  and comprehension).  Patient requires moderate 3-4+ max assistance in ADL's, but apparently is still able to feed herself. Apparently, patient's mobility is wheel chair limited for the last 3 to 4 months as patient is unable to stand or walk independently.       HTN predates since 1996.  Patient was dx'd with Diastolic Heart Failure in the ER in June - then diuresed & released. Patient's BP has been controlled at home and patient denies any cardiac symptoms as chest pain, palpitations, shortness of breath, dizziness or ankle swelling. Today's BP is at goal - 108/64      Patient's hyperlipidemia is controlled with diet and medications. Patient denies myalgias or other medication SE's. Last lipids were at goal albeit sl elevated Trig's: Lab Results  Component Value Date   CHOL 151 11/16/2018   HDL 56 11/16/2018   LDLCALC 71 11/16/2018   TRIG 160 (H) 11/16/2018   CHOLHDL 2.7 11/16/2018      Patient has hx/o prediabetes (A1c 6.0%  / 2011)and patient denies reactive hypoglycemic symptoms, visual blurring, diabetic polys or paresthesias. Last A1c was not at goal: Lab Results  Component Value Date   HGBA1C 6.4 (H) 11/16/2018      Finally, patient has history of Vitamin D Deficiency ("10" / 2008) and last Vitamin D was still low: Lab Results  Component Value Date   VD25OH 31 08/15/2018   Current Outpatient Medications on File Prior to Visit  Medication Sig  . ALPRAZolam (XANAX) 0.5 MG tablet Take 1/2-1 tablet 2 - 3 x /day ONLY if needed for Anxiety Attack &  limit to 5 days /week to avoid addiction (Patient taking differently: Take 0.5 mg by mouth 2 (two) times daily. )  . aspirin 81 MG tablet Take 81 mg by mouth daily. Buys OTC  . carbidopa-levodopa (SINEMET IR) 25-100 MG tablet Take 2 tablet at 9am and 2 tablets at 3pm.  . cetirizine (ZYRTEC) 10 MG tablet Take 10 mg by mouth daily.  . Cholecalciferol (VITAMIN D) 2000 units CAPS Take 2 capsules (4,000 Units total) by mouth daily. (Patient taking differently: Take 2,000 Units by mouth daily. )  . furosemide (LASIX) 20 MG tablet Take 1 tab (20 mg daily); if weight trending up above 148 lb with swelling or shortness of breath increase lasix for 40 mg (2 tabs) daily for 3-5 days until swelling resolves  . metoprolol tartrate (LOPRESSOR) 25 MG tablet Take 1/2 tablet 2 x /day  . omeprazole (PRILOSEC) 40 MG capsule Take 1 capsule (  40 mg total) by mouth daily.  . OXYGEN Inhale 2 L into the lungs daily as needed (for shortness of breath). Setting is on 2 when needed   . predniSONE (DELTASONE) 10 MG tablet Take 1 tablet 3 x /day or as directed (Patient taking differently: Take 10 mg by mouth 2 (two) times a day. )  . sertraline (ZOLOFT) 50 MG tablet Take 0.5 tablets (25 mg total) by mouth daily.  . TRELEGY ELLIPTA 100-62.5-25 MCG/INH AEPB Inhale 1 puff into the lungs daily.   . Zinc 50 MG TABS Take by mouth daily.  Marland Kitchen albuterol (PROVENTIL) (2.5 MG/3ML) 0.083% nebulizer solution  Use 1 ampule in nebulizer 4 times a day or every 4 hours as needed to rescue asthma. (Patient taking differently: Take 2.5 mg by nebulization See admin instructions. Use 1 ampule in nebulizer 4 times a day or every 4 hours as needed to rescue asthma.)   No current facility-administered medications on file prior to visit.    Allergies  Allergen Reactions  . Demerol  [Meperidine Hcl] Other (See Comments)    Pt. Feels like she is suffocating  . Ertapenem Hives    Caused whole body to turn red Other reaction(s): Redness Caused whole body to turn red  . Codeine Other (See Comments)    hallucinations  . Morphine And Related Other (See Comments)    hallucinations  . Other Other (See Comments)    Invan 7- pt became red all over  . Sulfa Antibiotics Rash   Past Medical History:  Diagnosis Date  . Balance disorder 2008.     Falls a lot.  She can be standing and then leans too far over to one side   . Bulging disc   . Chronic bronchitis ([place])    due to congestion at times, on prednisone and advair  . COPD (chronic obstructive pulmonary disease) ([place])   . COPD with acute exacerbation ([place]) 12/15/2017  . Depression    many years ago  . GERD (gastroesophageal reflux disease)   . Hearing loss    mild  . Hyperlipidemia   . Hypertension   . Iatrogenic adrenal insufficiency ([place])   . Incontinence    not indicated at this visit.  Marland Kitchen LVH (left ventricular hypertrophy) due to hypertensive disease    a. 02/2017: echo showing an EF of 65-70% with moderate LVH, hyperdynamic LV with subvalvular gradient of 70 mmHg, SAM, and mild MR  . Neuropathy    legs stay numb   . Osteoarthritis    hands,   . Osteoporosis   . Shortness of breath dyspnea   . Tubulovillous adenoma of rectum   . Wears dentures   . Wears glasses    Health Maintenance  Topic Date Due  . INFLUENZA VACCINE  05/20/2019  . TETANUS/TDAP  08/18/2023  . DEXA SCAN  Completed  . PNA vac Low Risk Adult  Completed   Immunization  History  Administered Date(s) Administered  . Influenza, High Dose Seasonal PF 08/23/2014, 07/24/2015, 08/13/2016, 07/12/2017, 07/13/2018  . Pneumococcal Conjugate-13 08/23/2014  . Pneumococcal-Unspecified 07/20/2003, 08/17/2013  . Td 08/17/2013  . Tdap 10/25/2012  . Zoster 03/19/2015   Last Colon - 11/28/2014 - [facility]  Last MGM - 04/19/2018 - and new MGM ordered on 04/19/2019 Past Surgical History:  Procedure Laterality Date  . ABDOMINAL HYSTERECTOMY    . APPENDECTOMY    . CHONDROPLASTY  09/19/2014   Procedure: CHONDROPLASTY;  Surgeon: Alta Corning, MD;  Location: [place];  Service: Orthopedics;;  . DILATION AND CURETTAGE OF UTERUS    . EXTERNAL FIXATION LEG  10/25/2012   Procedure: EXTERNAL FIXATION LEG;  Surgeon: Rozanna Box, MD;  Location: [place];  Service: Orthopedics;  Laterality: Right;  . EYE SURGERY     bilateral cataract surgery and lens implant  . FINGER SURGERY     fusions and debridements for OA  . INCONTINENCE SURGERY     multiple procedures, not cured  . KNEE ARTHROSCOPY WITH LATERAL MENISECTOMY Right 09/19/2014   Procedure: KNEE ARTHROSCOPY WITH LATERAL MENISECTOMY;  Surgeon: Alta Corning, MD;  Location: [place];  Service: Orthopedics;  Laterality: Right;  . KNEE ARTHROSCOPY WITH MEDIAL MENISECTOMY Right 09/19/2014   Procedure: RIGHT KNEE ARTHROSCOPY WITH MEDIAL AND LATERAL MENISECTOMIES. CHONDROPLASTY OF PATELLA-FEMORAL JOINT;  Surgeon: Alta Corning, MD;  Location: [place];  Service: Orthopedics;  Laterality: Right;  . RECTAL BIOPSY  09/21/2011   Procedure: BIOPSY RECTAL;  Surgeon: Merrie Roof, MD;  Location: [place];  Service: General;  Laterality: N/A;  3-4 cm  . RECTAL SURGERY     by dr. Marlou Starks, removal of polyp  . TONSILLECTOMY     Family History  Problem Relation Age of Onset  . Cancer Sister        pt unaware of what kind  . High blood pressure Mother   . COPD Mother    . Heart attack Father    Social History   Tobacco Use  . Smoking status: Never Smoker  . Smokeless tobacco: Never Used  Substance Use Topics  . Alcohol use: No  . Drug use: No    ROS Constitutional: Denies fever, chills, weight loss/gain, headaches, insomnia,  night sweats, and change in appetite. Does c/o fatigue. Eyes: Denies redness, blurred vision, diplopia, discharge, itchy, watery eyes.  ENT: Denies discharge, congestion, post nasal drip, epistaxis, sore throat, earache, hearing loss, dental pain, Tinnitus, Vertigo, Sinus pain, snoring.  Cardio: Denies chest pain, palpitations, irregular heartbeat, syncope, dyspnea, diaphoresis, orthopnea, PND, claudication, edema Respiratory: denies cough, dyspnea, DOE, pleurisy, hoarseness, laryngitis, wheezing.  Gastrointestinal: Denies dysphagia, heartburn, reflux, water brash, pain, cramps, nausea, vomiting, bloating, diarrhea, constipation, hematemesis, melena, hematochezia, jaundice, hemorrhoids Genitourinary: Denies dysuria, frequency, urgency, nocturia, hesitancy, discharge, hematuria, flank pain Breast: Breast lumps, nipple discharge, bleeding.  Musculoskeletal: Denies arthralgia, myalgia, stiffness, Jt. Swelling, pain, limp, and strain/sprain. Denies falls. Skin: Denies puritis, rash, hives, warts, acne, eczema, changing in skin lesion Neuro: No weakness, tremor, incoordination, spasms, paresthesia, pain Psychiatric: Denies confusion, memory loss, sensory loss. Denies Depression. Endocrine: Denies change in weight, skin, hair change, nocturia, and paresthesia, diabetic polys, visual blurring, hyper / hypo glycemic episodes.  Heme/Lymph: No excessive bleeding, bruising, enlarged lymph nodes.  Physical Exam  BP 108/64   Pulse 64   Temp (!) 97.4 F (36.3 C)   Resp 18   Ht 5\' 4"  (1.626 m)   Wt 140 lb (63.5 kg) Comment: unable to stand  BMI 24.03 kg/m   General Appearance: Well nourished, well groomed and in no apparent distress.   Eyes: PERRLA, EOMs, conjunctiva no swelling or erythema, normal fundi and vessels. Sinuses: No frontal/maxillary tenderness ENT/Mouth: EACs patent / TMs  nl. Nares clear without erythema, swelling, mucoid exudates. Oral hygiene is good. No erythema, swelling, or exudate. Tongue normal, non-obstructing. Tonsils not swollen or erythematous. Hearing normal.  Neck: Supple, thyroid not palpable. No bruits, nodes or JVD. Respiratory: Respiratory effort normal.  BS equal and clear bilateral without rales, rhonci, wheezing or stridor. Cardio: Heart sounds are normal with regular rate and rhythm and no murmurs, rubs or gallops. Peripheral pulses are normal and equal bilaterally without edema. No aortic or femoral bruits. Chest: symmetric with normal excursions and percussion. Breasts: Symmetric, without lumps, nipple discharge, retractions, or fibrocystic changes.  Abdomen: Flat, soft with bowel sounds active. Nontender, no guarding, rebound, hernias, masses, or organomegaly.  Lymphatics: Non tender without lymphadenopathy.  Musculoskeletal: Generalized decrease in muscle mass, power & decreased tone with stooped posture & broad-based gait. gait. Skin: Warm and dry without rashes, lesions, cyanosis, clubbing or  ecchymosis.  Neuro: Cranial nerves intact, reflexes equal bilaterally. Speech sl slurred and dysarthric. Titubation of the chin.  High freq / low amplitude tremor of hands left > right. Sensation intact.  Pysch: Alert and oriented x 3, flat affect, with poor Insight / comprehension and judgment.   Assessment and Plan  1. Essential hypertension  - EKG 12-Lead - Urinalysis, Routine w reflex microscopic - Microalbumin / creatinine urine ratio - CBC with Differential/Platelet - COMPLETE METABOLIC PANEL WITH GFR - Magnesium - TSH  2. Hyperlipidemia, mixed  - Lipid panel  3. Abnormal glucose  - Hemoglobin A1c - Insulin, random  4. Vitamin D deficiency  - VITAMIN D 25 Hydroxyl  5.  Prediabetes  - Hemoglobin A1c - Insulin, random  6. Parkinson's plus syndrome ([place])   7. Chronic respiratory failure with hypoxia, on home O2 therapy (HCC)  - COMPLETE METABOLIC PANEL WITH GFR  8. Chronic diastolic CHF (congestive heart failure) (HCC)  - EKG 12-Lead  9. Aortic atherosclerosis (HCC)  - EKG 12-Lead  10. Screening for colorectal cancer  - POC Hemoccult Bld/Stl  11. Screening for ischemic heart disease  - EKG 12-Lead  12. FHx: heart disease  - EKG 12-Lead  13. Medication management  - Urinalysis, Routine w reflex microscopic - Microalbumin / creatinine urine ratio - CBC with Differential/Platelet - COMPLETE METABOLIC PANEL WITH GFR - Magnesium - Lipid panel - TSH - Hemoglobin A1c - Insulin, random - VITAMIN D 25 Hydroxyl        Patient was counseled in prudent diet to achieve/maintain BMI less than 25 for weight control, BP monitoring, regular exercise and medications. Discussed med's effects and SE's. Screening labs and tests as requested with regular follow-up as recommended. Over 40 minutes of exam, counseling, chart review and high complex critical decision making was performed.   Kirtland Bouchard, MD

## 2019-06-06 ENCOUNTER — Other Ambulatory Visit: Payer: Self-pay

## 2019-06-06 ENCOUNTER — Ambulatory Visit (INDEPENDENT_AMBULATORY_CARE_PROVIDER_SITE_OTHER): Payer: Medicare Other | Admitting: Internal Medicine

## 2019-06-06 ENCOUNTER — Encounter: Payer: Self-pay | Admitting: Internal Medicine

## 2019-06-06 VITALS — BP 108/64 | HR 64 | Temp 97.4°F | Resp 18 | Ht 64.0 in | Wt 140.0 lb

## 2019-06-06 DIAGNOSIS — I700 Atherosclerosis of aorta: Secondary | ICD-10-CM | POA: Diagnosis not present

## 2019-06-06 DIAGNOSIS — G232 Striatonigral degeneration: Secondary | ICD-10-CM | POA: Diagnosis not present

## 2019-06-06 DIAGNOSIS — I5032 Chronic diastolic (congestive) heart failure: Secondary | ICD-10-CM | POA: Diagnosis not present

## 2019-06-06 DIAGNOSIS — R7309 Other abnormal glucose: Secondary | ICD-10-CM

## 2019-06-06 DIAGNOSIS — N3000 Acute cystitis without hematuria: Secondary | ICD-10-CM

## 2019-06-06 DIAGNOSIS — R7303 Prediabetes: Secondary | ICD-10-CM

## 2019-06-06 DIAGNOSIS — Z136 Encounter for screening for cardiovascular disorders: Secondary | ICD-10-CM | POA: Diagnosis not present

## 2019-06-06 DIAGNOSIS — Z9981 Dependence on supplemental oxygen: Secondary | ICD-10-CM | POA: Diagnosis not present

## 2019-06-06 DIAGNOSIS — I10 Essential (primary) hypertension: Secondary | ICD-10-CM

## 2019-06-06 DIAGNOSIS — Z79899 Other long term (current) drug therapy: Secondary | ICD-10-CM | POA: Diagnosis not present

## 2019-06-06 DIAGNOSIS — E782 Mixed hyperlipidemia: Secondary | ICD-10-CM | POA: Diagnosis not present

## 2019-06-06 DIAGNOSIS — Z1211 Encounter for screening for malignant neoplasm of colon: Secondary | ICD-10-CM

## 2019-06-06 DIAGNOSIS — E559 Vitamin D deficiency, unspecified: Secondary | ICD-10-CM | POA: Diagnosis not present

## 2019-06-06 DIAGNOSIS — J9611 Chronic respiratory failure with hypoxia: Secondary | ICD-10-CM | POA: Diagnosis not present

## 2019-06-06 DIAGNOSIS — Z8249 Family history of ischemic heart disease and other diseases of the circulatory system: Secondary | ICD-10-CM

## 2019-06-06 DIAGNOSIS — G20C Parkinsonism, unspecified: Secondary | ICD-10-CM

## 2019-06-07 LAB — COMPLETE METABOLIC PANEL WITH GFR
AG Ratio: 1.6 (calc) (ref 1.0–2.5)
ALT: 10 U/L (ref 6–29)
AST: 14 U/L (ref 10–35)
Albumin: 3.9 g/dL (ref 3.6–5.1)
Alkaline phosphatase (APISO): 49 U/L (ref 37–153)
BUN/Creatinine Ratio: 28 (calc) — ABNORMAL HIGH (ref 6–22)
BUN: 25 mg/dL (ref 7–25)
CO2: 30 mmol/L (ref 20–32)
Calcium: 9.6 mg/dL (ref 8.6–10.4)
Chloride: 105 mmol/L (ref 98–110)
Creat: 0.89 mg/dL — ABNORMAL HIGH (ref 0.60–0.88)
GFR, Est African American: 71 mL/min/{1.73_m2} (ref >=60)
GFR, Est Non African American: 61 mL/min/{1.73_m2} (ref >=60)
Globulin: 2.5 g/dL (calc) (ref 1.9–3.7)
Glucose, Bld: 118 mg/dL — ABNORMAL HIGH (ref 65–99)
Potassium: 4.8 mmol/L (ref 3.5–5.3)
Sodium: 142 mmol/L (ref 135–146)
Total Bilirubin: 0.6 mg/dL (ref 0.2–1.2)
Total Protein: 6.4 g/dL (ref 6.1–8.1)

## 2019-06-07 LAB — CBC WITH DIFFERENTIAL/PLATELET
Absolute Monocytes: 248 cells/uL (ref 200–950)
Basophils Absolute: 90 cells/uL (ref 0–200)
Basophils Relative: 1.2 %
Eosinophils Absolute: 23 cells/uL (ref 15–500)
Eosinophils Relative: 0.3 %
HCT: 43.3 % (ref 35.0–45.0)
Hemoglobin: 13.5 g/dL (ref 11.7–15.5)
Lymphs Abs: 1478 cells/uL (ref 850–3900)
MCH: 25.3 pg — ABNORMAL LOW (ref 27.0–33.0)
MCHC: 31.2 g/dL — ABNORMAL LOW (ref 32.0–36.0)
MCV: 81.1 fL (ref 80.0–100.0)
MPV: 11.1 fL (ref 7.5–12.5)
Monocytes Relative: 3.3 %
Neutro Abs: 5663 cells/uL (ref 1500–7800)
Neutrophils Relative %: 75.5 %
Platelets: 301 10*3/uL (ref 140–400)
RBC: 5.34 10*6/uL — ABNORMAL HIGH (ref 3.80–5.10)
RDW: 15.8 % — ABNORMAL HIGH (ref 11.0–15.0)
Total Lymphocyte: 19.7 %
WBC: 7.5 10*3/uL (ref 3.8–10.8)

## 2019-06-07 LAB — LIPID PANEL
Cholesterol: 171 mg/dL (ref <200)
HDL: 57 mg/dL (ref >=50)
LDL Cholesterol (Calc): 93 mg/dL (calc)
Non-HDL Cholesterol (Calc): 114 mg/dL (calc) (ref <130)
Total CHOL/HDL Ratio: 3 (calc) (ref <5.0)
Triglycerides: 117 mg/dL (ref <150)

## 2019-06-07 LAB — HEMOGLOBIN A1C
Hgb A1c MFr Bld: 6.3 % of total Hgb — ABNORMAL HIGH (ref <5.7)
Mean Plasma Glucose: 134 (calc)
eAG (mmol/L): 7.4 (calc)

## 2019-06-07 LAB — MAGNESIUM: Magnesium: 2.2 mg/dL (ref 1.5–2.5)

## 2019-06-07 LAB — VITAMIN D 25 HYDROXY (VIT D DEFICIENCY, FRACTURES): Vit D, 25-Hydroxy: 30 ng/mL (ref 30–100)

## 2019-06-07 LAB — TSH: TSH: 0.82 mIU/L (ref 0.40–4.50)

## 2019-06-07 LAB — INSULIN, RANDOM: Insulin: 6.2 u[IU]/mL

## 2019-06-12 DIAGNOSIS — M79675 Pain in left toe(s): Secondary | ICD-10-CM | POA: Diagnosis not present

## 2019-06-12 DIAGNOSIS — I739 Peripheral vascular disease, unspecified: Secondary | ICD-10-CM | POA: Diagnosis not present

## 2019-06-12 DIAGNOSIS — B351 Tinea unguium: Secondary | ICD-10-CM | POA: Diagnosis not present

## 2019-06-12 DIAGNOSIS — M79674 Pain in right toe(s): Secondary | ICD-10-CM | POA: Diagnosis not present

## 2019-06-12 DIAGNOSIS — J9611 Chronic respiratory failure with hypoxia: Secondary | ICD-10-CM | POA: Diagnosis not present

## 2019-06-12 DIAGNOSIS — I10 Essential (primary) hypertension: Secondary | ICD-10-CM | POA: Diagnosis not present

## 2019-06-12 DIAGNOSIS — R7303 Prediabetes: Secondary | ICD-10-CM | POA: Diagnosis not present

## 2019-06-12 DIAGNOSIS — E559 Vitamin D deficiency, unspecified: Secondary | ICD-10-CM | POA: Diagnosis not present

## 2019-06-12 DIAGNOSIS — Z79899 Other long term (current) drug therapy: Secondary | ICD-10-CM | POA: Diagnosis not present

## 2019-06-12 DIAGNOSIS — E782 Mixed hyperlipidemia: Secondary | ICD-10-CM | POA: Diagnosis not present

## 2019-06-12 DIAGNOSIS — S91102A Unspecified open wound of left great toe without damage to nail, initial encounter: Secondary | ICD-10-CM | POA: Diagnosis not present

## 2019-06-12 DIAGNOSIS — Z9981 Dependence on supplemental oxygen: Secondary | ICD-10-CM | POA: Diagnosis not present

## 2019-06-12 DIAGNOSIS — R7309 Other abnormal glucose: Secondary | ICD-10-CM | POA: Diagnosis not present

## 2019-06-13 ENCOUNTER — Other Ambulatory Visit: Payer: Self-pay | Admitting: Internal Medicine

## 2019-06-13 DIAGNOSIS — N3000 Acute cystitis without hematuria: Secondary | ICD-10-CM | POA: Diagnosis not present

## 2019-06-13 LAB — URINALYSIS, ROUTINE W REFLEX MICROSCOPIC
Bilirubin Urine: NEGATIVE
Glucose, UA: NEGATIVE
Hyaline Cast: NONE SEEN /LPF
Ketones, ur: NEGATIVE
Nitrite: POSITIVE — AB
Specific Gravity, Urine: 1.023 (ref 1.001–1.03)
pH: <=5 (ref 5.0–8.0)

## 2019-06-13 LAB — MICROALBUMIN / CREATININE URINE RATIO
Creatinine, Urine: 204 mg/dL (ref 20–275)
Microalb Creat Ratio: 6 mcg/mg creat (ref <30)
Microalb, Ur: 1.3 mg/dL

## 2019-06-13 NOTE — Addendum Note (Signed)
Addended by: Eulis Canner on: 06/13/2019 09:31 AM   Modules accepted: Orders

## 2019-06-15 ENCOUNTER — Ambulatory Visit: Payer: Medicare Other

## 2019-06-15 LAB — URINE CULTURE
MICRO NUMBER:: 809568
SPECIMEN QUALITY:: ADEQUATE

## 2019-07-02 ENCOUNTER — Other Ambulatory Visit: Payer: Self-pay | Admitting: Physician Assistant

## 2019-07-03 ENCOUNTER — Other Ambulatory Visit: Payer: Self-pay | Admitting: Internal Medicine

## 2019-07-03 DIAGNOSIS — F419 Anxiety disorder, unspecified: Secondary | ICD-10-CM

## 2019-07-03 MED ORDER — ALPRAZOLAM 0.5 MG PO TABS: ORAL_TABLET | ORAL | 0 refills | Status: DC

## 2019-07-05 DIAGNOSIS — Z23 Encounter for immunization: Secondary | ICD-10-CM | POA: Diagnosis not present

## 2019-07-09 ENCOUNTER — Other Ambulatory Visit: Payer: Self-pay | Admitting: Internal Medicine

## 2019-07-10 DIAGNOSIS — S91102A Unspecified open wound of left great toe without damage to nail, initial encounter: Secondary | ICD-10-CM | POA: Diagnosis not present

## 2019-07-10 DIAGNOSIS — I739 Peripheral vascular disease, unspecified: Secondary | ICD-10-CM | POA: Diagnosis not present

## 2019-07-17 NOTE — Progress Notes (Signed)
Assessment and Plan: Donn was seen today for back pain.  Diagnoses and all orders for this visit:  Acute midline thoracic back pain Discussed using ice 10-44min Try OTC Voltaren topical gel to area, discussed avoiding ice or heat immediately after application May also use Tylenol 1,000mg  TID PRN -     meloxicam (MOBIC) 15 MG tablet; Take one tablet daily as needed for pain.  Take with food.  Avoid ibuprofen but ok to take tylenol. When pain subsides, discussed strengthening/stretching that can be done from chair.  At high risk for falls / Unstable Gait Discussed safety around home with patient and family Keeping pathway to bathroom clear She is utilizing assistance with all ambulatory activities  Chronic respiratory failure with hypoxia, on home O2 therapy ([place]) Doing well at this time Encouraged deep breathing exercises Continue Trelegy daily, rinse mouth after use Has PRN albuterol nebs, has not had to use this in past couple weeks.  Essential hypertension Controlled today Continue metoprolol 25mg  and lasix 20mg   Protein-calorie malnutrition, severe Discussed methods to increase caloric intake Ensure / boost / milkshakes  Actinic keratoses Left cheek, schedule appointment for removal  No labs today  Further disposition pending results of labs. Discussed med's effects and SE's.   Over 30 minutes of exam, counseling, chart review, and critical decision making was performed.   Future Appointments  Date Time Provider [place]  07/18/2019  4:00 PM Garnet Sierras, NP [facility] None  07/26/2019 10:10 AM Narda Amber K, DO [facility] None  09/12/2019  2:30 PM Liane Comber, NP [facility] None  12/13/2019  2:00 PM Vicie Mutters, PA-C [facility] None  06/17/2020  2:00 PM [name], MD [facility] None    ------------------------------------------------------------------------------------------------------------------   HPI 83 y.o.female presents for  evaluation of back pain that is chronic.  She is accompanied with her daughter and hisband today.  She ambulates with a rolling walker.  She sits in her recliner chain the majority of the day.  She reclines herself during the day.  She is not very active and has sedentary lifestyle.  Daughter reports they assist her to the bathroom by pushing her on the rolling walker.  She does stand and transfer self to toilet and her recliner.  She is able to dress herself but also needs some assistance with this at times.  Meals are prepared for her and her appetite is fair although family has encouraged her to eat as well as ensure type supplements in the past.   She is alert and oriented x2.  She reports pain in the middle of her thoracic spine.  This is intermittent pain that has been chronic in nature.  She has taken meloxicam in the past with positive results.  She is almost out of this.  She has been taking tylenol 1,000mg  once or twice a day that mildly helps with the pain.  She has not used any ice but has used heat to the area.  She has not used any topical preparations.  She denies any falls or trauma, numbness, tingling, radiating pains, abdominal pain, nausea or vomiting, chest pains or shortness of breath.  Daughter reports area to left sie of her cheek that has been darkening in color, getting larger and noticed the patient picking at it.  Patient reports it is bothersome at times with some tenderness.  Denies any exudate or bleeding.  Past Medical History:  Diagnosis Date  . Balance disorder 2008.     Falls a lot.  She can be  standing and then leans too far over to one side   . Bulging disc   . Chronic bronchitis ([place])    due to congestion at times, on prednisone and advair  . COPD (chronic obstructive pulmonary disease) ([place])   . COPD with acute exacerbation ([place]) 12/15/2017  . Depression    many years ago  . GERD (gastroesophageal reflux disease)   . Hearing loss    mild  . Hyperlipidemia   .  Hypertension   . Iatrogenic adrenal insufficiency ([place])   . Incontinence    not indicated at this visit.  Marland Kitchen LVH (left ventricular hypertrophy) due to hypertensive disease    a. 02/2017: echo showing an EF of 65-70% with moderate LVH, hyperdynamic LV with subvalvular gradient of 70 mmHg, SAM, and mild MR  . Neuropathy    legs stay numb   . Osteoarthritis    hands,   . Osteoporosis   . Shortness of breath dyspnea   . Tubulovillous adenoma of rectum   . Wears dentures   . Wears glasses      Allergies  Allergen Reactions  . Demerol  [Meperidine Hcl] Other (See Comments)    Pt. Feels like she is suffocating  . Ertapenem Hives    Caused whole body to turn red Other reaction(s): Redness Caused whole body to turn red  . Codeine Other (See Comments)    hallucinations  . Morphine And Related Other (See Comments)    hallucinations  . Other Other (See Comments)    Invan 7- pt became red all over  . Sulfa Antibiotics Rash    Current Outpatient Medications on File Prior to Visit  Medication Sig  . albuterol (PROVENTIL) (2.5 MG/3ML) 0.083% nebulizer solution Use 1 ampule in nebulizer 4 times a day or every 4 hours as needed to rescue asthma. (Patient taking differently: Take 2.5 mg by nebulization See admin instructions. Use 1 ampule in nebulizer 4 times a day or every 4 hours as needed to rescue asthma.)  . ALPRAZolam (XANAX) 0.5 MG tablet Take 1/2-1 tablet 2 - 3 x /day ONLY if needed for Anxiety Attack &  limit to 5 days /week to avoid addiction  . aspirin 81 MG tablet Take 81 mg by mouth daily. Buys OTC  . carbidopa-levodopa (SINEMET IR) 25-100 MG tablet Take 2 tablet at 9am and 2 tablets at 3pm.  . cetirizine (ZYRTEC) 10 MG tablet Take 10 mg by mouth daily.  . Cholecalciferol (VITAMIN D) 2000 units CAPS Take 2 capsules (4,000 Units total) by mouth daily. (Patient taking differently: Take 2,000 Units by mouth daily. )  . furosemide (LASIX) 20 MG tablet Take 1 tab (20 mg daily); if  weight trending up above 148 lb with swelling or shortness of breath increase lasix for 40 mg (2 tabs) daily for 3-5 days until swelling resolves  . metoprolol tartrate (LOPRESSOR) 25 MG tablet Take 1/2 tablet 2 x /day  . omeprazole (PRILOSEC) 40 MG capsule Take 1 capsule Daily for Indigestion & Heartburn  . OXYGEN Inhale 2 L into the lungs daily as needed (for shortness of breath). Setting is on 2 when needed   . predniSONE (DELTASONE) 10 MG tablet Take 1 tablet 3 x /day or as directed (Patient taking differently: Take 10 mg by mouth 2 (two) times a day. )  . sertraline (ZOLOFT) 50 MG tablet Take 0.5 tablets (25 mg total) by mouth daily.  . TRELEGY ELLIPTA 100-62.5-25 MCG/INH AEPB Inhale 1 puff into the lungs daily.   Marland Kitchen  Zinc 50 MG TABS Take by mouth daily.   No current facility-administered medications on file prior to visit.     ROS: all negative except above.   Physical Exam:  There were no vitals taken for this visit.  General Appearance: Thin and frail, in no apparent distress. Eyes: PERRLA, EOMs, conjunctiva no swelling or erythema Sinuses: No Frontal/maxillary tenderness ENT/Mouth: Ext aud canals clear, TMs without erythema, bulging. No erythema, swelling, or exudate on post pharynx.  Tonsils not swollen or erythematous. Hearing normal.  Neck: Supple, thyroid normal.  Respiratory: Respiratory effort normal, BS equal bilaterally without rales, rhonchi, wheezing or stridor.  Cardio: RRR with no MRGs. Brisk peripheral pulses without edema.  Abdomen: Soft, + BS.  Non tender, no guarding, rebound, hernias, masses. Lymphatics: Non tender without lymphadenopathy.  Musculoskeletal: Full ROM, 5/5 strength, normal gait.  Skin: Warm, dry without rashes, ecchymosis. Prolonged tenting noted. Pigmented actinic keratosis noted to left cheek. Neuro: Cranial nerves intact. Normal muscle tone, no cerebellar symptoms. Sensation intact.  Psych: Awake and oriented X 3, normal affect, Insight and  Judgment appropriate related to baseline.    Garnet Sierras, NP 11:59 PM [facility]

## 2019-07-18 ENCOUNTER — Ambulatory Visit (INDEPENDENT_AMBULATORY_CARE_PROVIDER_SITE_OTHER): Payer: Medicare Other | Admitting: Adult Health Nurse Practitioner

## 2019-07-18 ENCOUNTER — Encounter: Payer: Self-pay | Admitting: Adult Health Nurse Practitioner

## 2019-07-18 ENCOUNTER — Other Ambulatory Visit: Payer: Self-pay

## 2019-07-18 VITALS — BP 110/72 | HR 82 | Temp 96.3°F

## 2019-07-18 DIAGNOSIS — R269 Unspecified abnormalities of gait and mobility: Secondary | ICD-10-CM

## 2019-07-18 DIAGNOSIS — Z9181 History of falling: Secondary | ICD-10-CM

## 2019-07-18 DIAGNOSIS — L570 Actinic keratosis: Secondary | ICD-10-CM

## 2019-07-18 DIAGNOSIS — J9611 Chronic respiratory failure with hypoxia: Secondary | ICD-10-CM | POA: Diagnosis not present

## 2019-07-18 DIAGNOSIS — E43 Unspecified severe protein-calorie malnutrition: Secondary | ICD-10-CM

## 2019-07-18 DIAGNOSIS — I10 Essential (primary) hypertension: Secondary | ICD-10-CM | POA: Diagnosis not present

## 2019-07-18 DIAGNOSIS — Z9981 Dependence on supplemental oxygen: Secondary | ICD-10-CM | POA: Diagnosis not present

## 2019-07-18 DIAGNOSIS — M546 Pain in thoracic spine: Secondary | ICD-10-CM | POA: Diagnosis not present

## 2019-07-18 MED ORDER — MELOXICAM 15 MG PO TABS: ORAL_TABLET | ORAL | 2 refills | Status: AC

## 2019-07-18 NOTE — Patient Instructions (Addendum)
Also consider trying Voltaren Gel (diclofenac).  This is a topical gel that is sold at any pharmacy.  We will send in Meloxicam may take one tablet tablet daily as needed for back pain.  This helps with inflammation  You may also take Tylenol 1,000mg  every 8 hours as needed.  No more than 3,000mg  a day.    Meloxicam tablets What is this medicine? MELOXICAM (mel OX i cam) is a non-steroidal anti-inflammatory drug (NSAID). It is used to reduce swelling and to treat pain. It may be used for osteoarthritis, rheumatoid arthritis, or juvenile rheumatoid arthritis. This medicine may be used for other purposes; ask your health care provider or pharmacist if you have questions. COMMON BRAND NAME(S): Mobic What should I tell my health care provider before I take this medicine? They need to know if you have any of these conditions:  bleeding disorders  cigarette smoker  coronary artery bypass graft (CABG) surgery within the past 2 weeks  drink more than 3 alcohol-containing drinks per day  heart disease  high blood pressure  history of stomach bleeding  kidney disease  liver disease  lung or breathing disease, like asthma  stomach or intestine problems  an unusual or allergic reaction to meloxicam, aspirin, other NSAIDs, other medicines, foods, dyes, or preservatives  pregnant or trying to get pregnant  breast-feeding How should I use this medicine? Take this medicine by mouth with a full glass of water. Follow the directions on the prescription label. You can take it with or without food. If it upsets your stomach, take it with food. Take your medicine at regular intervals. Do not take it more often than directed. Do not stop taking except on your doctor's advice. A special MedGuide will be given to you by the pharmacist with each prescription and refill. Be sure to read this information carefully each time. Talk to your pediatrician regarding the use of this medicine in children.  While this drug may be prescribed for selected conditions, precautions do apply. Patients over 75 years old may have a stronger reaction and need a smaller dose. Overdosage: If you think you have taken too much of this medicine contact a poison control center or emergency room at once. NOTE: This medicine is only for you. Do not share this medicine with others. What if I miss a dose? If you miss a dose, take it as soon as you can. If it is almost time for your next dose, take only that dose. Do not take double or extra doses. What may interact with this medicine? Do not take this medicine with any of the following medications:  cidofovir  ketorolac This medicine may also interact with the following medications:  aspirin and aspirin-like medicines  certain medicines for blood pressure, heart disease, irregular heart beat  certain medicines for depression, anxiety, or psychotic disturbances  certain medicines that treat or prevent blood clots like warfarin, enoxaparin, dalteparin, apixaban, dabigatran, rivaroxaban  cyclosporine  diuretics  fluconazole  lithium  methotrexate  other NSAIDs, medicines for pain and inflammation, like ibuprofen and naproxen  pemetrexed This list may not describe all possible interactions. Give your health care provider a list of all the medicines, herbs, non-prescription drugs, or dietary supplements you use. Also tell them if you smoke, drink alcohol, or use illegal drugs. Some items may interact with your medicine. What should I watch for while using this medicine? Tell your doctor or healthcare provider if your symptoms do not start to get better or  if they get worse. This medicine may cause serious skin reactions. They can happen weeks to months after starting the medicine. Contact your healthcare provider right away if you notice fevers or flu-like symptoms with a rash. The rash may be red or purple and then turn into blisters or peeling of the  skin. Or, you might notice a red rash with swelling of the face, lips or lymph nodes in your neck or under your arms. Do not take other medicines that contain aspirin, ibuprofen, or naproxen with this medicine. Side effects such as stomach upset, nausea, or ulcers may be more likely to occur. Many medicines available without a prescription should not be taken with this medicine. This medicine can cause ulcers and bleeding in the stomach and intestines at any time during treatment. This can happen with no warning and may cause death. There is increased risk with taking this medicine for a long time. Smoking, drinking alcohol, older age, and poor health can also increase risks. Call your doctor right away if you have stomach pain or blood in your vomit or stool. This medicine does not prevent heart attack or stroke. In fact, this medicine may increase the chance of a heart attack or stroke. The chance may increase with longer use of this medicine and in people who have heart disease. If you take aspirin to prevent heart attack or stroke, talk with your doctor or healthcare provider. What side effects may I notice from receiving this medicine? Side effects that you should report to your doctor or health care professional as soon as possible:  allergic reactions like skin rash, itching or hives, swelling of the face, lips, or tongue  nausea, vomiting  redness, blistering, peeling, or loosening of the skin, including inside the mouth  signs and symptoms of a blood clot such as breathing problems; changes in vision; chest pain; severe, sudden headache; pain, swelling, warmth in the leg; trouble speaking; sudden numbness or weakness of the face, arm, or leg  signs and symptoms of bleeding such as bloody or black, tarry stools; red or dark-brown urine; spitting up blood or brown material that looks like coffee grounds; red spots on the skin; unusual bruising or bleeding from the eye, gums, or nose  signs  and symptoms of liver injury like dark yellow or brown urine; general ill feeling or flu-like symptoms; light-colored stools; loss of appetite; nausea; right upper belly pain; unusually weak or tired; yellowing of the eyes or skin  signs and symptoms of stroke like changes in vision; confusion; trouble speaking or understanding; severe headaches; sudden numbness or weakness of the face, arm, or leg; trouble walking; dizziness; loss of balance or coordination Side effects that usually do not require medical attention (report to your doctor or health care professional if they continue or are bothersome):  constipation  diarrhea  gas This list may not describe all possible side effects. Call your doctor for medical advice about side effects. You may report side effects to FDA at 1-800-FDA-1088. Where should I keep my medicine? Keep out of the reach of children. Store at room temperature between 15 and 30 degrees C (59 and 86 degrees F). Throw away any unused medicine after the expiration date. NOTE: This sheet is a summary. It may not cover all possible information. If you have questions about this medicine, talk to your doctor, pharmacist, or health care provider.  2020 [employer]/Gold Standard (2019-01-04 11:21:28)    Here are some simple exercises I like you can  do from a chair.  This is great if you are not able to get outdoors or restricted by your breathing.  Step-By-Step Chair Exercises  To maximize your chair exercise routine, you'll need a set of small dumbbells weighing anywhere from 5 to 8 pounds each, as well as a resistance band. Start your routine by doing a couple of exercises, then gradually add more to your routine. Repeat each movement 3 to 10 times.     1. Sitting Side Tilt  This is a great exercise to begin with because it warms up the body while improving core strength. Sit up straight in your chair and tilt your upper body as far as you can to the left, return to  center, then repeat the exercise on your right.        2. Back Archer  The Back Fernande Boyden will help to improve posture and open up your chest. Clasp your hands behind your back. Arch your back and push your hands away from you. Hold for 1-3 seconds, then release your hands and relax your back. Repeat.     3. Shoulder Arcs  This exercise strengthens the shoulders and works the rotator cuffs, which tend to be a problem area for older adults as those muscles are used less often. Hold a weight in each hand, palms facing in. Move your arms forward and up to shoulder level, then lower. You can also try moving your arms out to the side and raising them to shoulder height, then lowering them. As you build strength, raise your arms as high as possible over your head before lowering them. Repeat.      4. Toe Up  The Toe Up strengthens your calves. Press toes into the floor to raise your knees, then lower and press heels into the floor. Repeat.       5. Sitting Quad Set  If you spend a significant amount of time in the sitting position, it is not uncommon to see muscle mass loss in your legs. To prevent muscle loss, the Sitting Quad Set will help keep your muscles strong and active. Raise your left foot to straighten your leg, tightening the muscle in your thigh. Hold for 10-30 seconds, then lower. Repeat with your right leg.     6. Chair Push-up  Chair Push-ups can greatly improve stability and strengthen your core. Though this exercise is slightly more advanced, you can work up to it with time. Grip the arms of the chair and press down to push your buttocks up. Lower yourself back into the chair. Repeat.     7. Side Leg Kick  For improved balance and strengthening your legs, try the Side Leg Kick exercise. Stand up, holding the back of the chair for balance, and raise your left leg out to the side. Lower your left leg, then repeat with your right leg.    8. Leg Swing  The Leg Swing  is another exercise that helps improve your balance and strengthen the legs. Stand behind your chair, holding the top for balance, and lift your left leg out in front of you. Swing your left leg out behind you, then bring back to the middle. Repeat with your right leg.     9. Ankle Bend  This exercise primarily works to improve your balance. Holding your chair for support, press your left heel into the floor and gently bend your ankle and raise your toes, keeping your body straight throughout the exercise. Lower your toes  and repeat with right leg.    If you are just getting started with chair exercises, avoid injury by having another person there for support. They can help make sure you are using good form for each exercise. Listen to your body and start out slowly in the beginning.  Do what you can, start with two and slowly add exercise every 3-4 days.  This will help to work your muscles to increase stability and bone health.

## 2019-07-21 ENCOUNTER — Encounter: Payer: Self-pay | Admitting: Adult Health Nurse Practitioner

## 2019-07-24 ENCOUNTER — Encounter: Payer: Self-pay | Admitting: Neurology

## 2019-07-26 ENCOUNTER — Telehealth (INDEPENDENT_AMBULATORY_CARE_PROVIDER_SITE_OTHER): Payer: Medicare Other | Admitting: Neurology

## 2019-07-26 ENCOUNTER — Other Ambulatory Visit: Payer: Self-pay

## 2019-07-26 DIAGNOSIS — M21372 Foot drop, left foot: Secondary | ICD-10-CM

## 2019-07-26 DIAGNOSIS — G231 Progressive supranuclear ophthalmoplegia [Steele-Richardson-Olszewski]: Secondary | ICD-10-CM | POA: Diagnosis not present

## 2019-07-26 MED ORDER — CARBIDOPA-LEVODOPA 25-100 MG PO TABS: ORAL_TABLET | ORAL | 3 refills | Status: DC

## 2019-07-26 NOTE — Progress Notes (Signed)
    Virtual Visit via Telephone Note The purpose of this virtual visit is to provide medical care while limiting exposure to the novel coronavirus.    Consent was obtained for phone visit:  Yes.   Answered questions that patient had about telehealth interaction:  Yes.   I discussed the limitations, risks, security and privacy concerns of performing an evaluation and management service by telephone. I also discussed with the patient that there may be a patient responsible charge related to this service. The patient expressed understanding and agreed to proceed.  Pt location: Home Physician Location: office Name of referring provider:  [name], MD I connected with .Marie [name] at patients initiation/request on 07/26/2019 at 10:10 AM EDT by telephone and verified that I am speaking with the correct person using two identifiers.  Pt MRN:  [id number] Pt DOB:  28-Feb-1936   History of Present Illness: This is an 83 year-old female returning for progressive supranuclear palsy, a Parkinson-plus syndrome.  Over the past several months, she has become much more sedentary and walks very little, will need to transfer to the bathroom.  When she does well, her daughter has noticed that she is very stiff and spastic.  She has not suffered any falls and does use a walker.  There has been no change in the degree of left foot weakness.  She requires assistance for nearly all the ADLs, except for feeding and toileting.  She has a caregiver that comes from 9 to 5 PM.  Family feels this is adequate support.  Her tremors are better controlled when she is taking medication.  Daughter feels that it is a little harder for her to take the number of pills she needs to and is asking for other options.  Appetite is faiir.   Assessment and Plan:   1.  Advanced progressive supranuclear palsy, Parkinson plus syndrome.  Overall, stable with advanced disease.  Recommend staying on Sinemet 2 tablets at 9 AM and 3 PM.   To help with administration, I recommend that they crush her tablets and offer them in applesauce or peanut butter.   Family is very appreciative for the telephone visits, as it is becoming increasingly difficult for her to leave the home.  Going forward, palliative medicine consultation may be appropriate. Continue supportive care  2.  Left foot weakness, most likely due to L5 radiculopathy.  Patient is not a surgical candidate and therefore imaging was declined.  Fall precautions discussed  Follow Up Instructions:   I discussed the assessment and treatment plan with the patient. The patient was provided an opportunity to ask questions and all were answered. The patient agreed with the plan and demonstrated an understanding of the instructions.   The patient was advised to call back or seek an in-person evaluation if the symptoms worsen or if the condition fails to improve as anticipated.    Total Time spent in visit with the patient was:  25 min, of which 100% of the time was spent in counseling and/or coordinating care.   Pt understands and agrees with the plan of care outlined.     Alda Berthold, DO

## 2019-07-28 ENCOUNTER — Encounter: Payer: Self-pay | Admitting: Neurology

## 2019-07-31 ENCOUNTER — Telehealth: Payer: Self-pay | Admitting: Neurology

## 2019-07-31 DIAGNOSIS — I739 Peripheral vascular disease, unspecified: Secondary | ICD-10-CM | POA: Diagnosis not present

## 2019-07-31 DIAGNOSIS — B351 Tinea unguium: Secondary | ICD-10-CM | POA: Diagnosis not present

## 2019-07-31 NOTE — Telephone Encounter (Signed)
Daughter notified 

## 2019-07-31 NOTE — Telephone Encounter (Signed)
The last dose is around 7 or 8pm, sometimes later, does she even need to even take the last dose?

## 2019-07-31 NOTE — Telephone Encounter (Signed)
As long as she can take it twice daily (9am and 3pm), ok to skip evening dose.

## 2019-07-31 NOTE — Telephone Encounter (Signed)
Patient daughter wants to talk to someone about her medication  Sinemet. She states the patient is not taking her medication at the times she should be taking them so she wasn't sure about the night time dosage

## 2019-08-07 ENCOUNTER — Ambulatory Visit: Payer: Medicare Other | Admitting: Neurology

## 2019-08-16 ENCOUNTER — Telehealth: Payer: Self-pay

## 2019-08-16 NOTE — Telephone Encounter (Signed)
Marie [name], patient's daughter called inquiring about getting a prescription for muscle spasms. Patient's back is really bothering her, is sedentary with limited mobility.  Per Caryl Pina, patient can try Voltaren gel and Solanpas and to call if not any better in the morning.

## 2019-09-11 DIAGNOSIS — J9611 Chronic respiratory failure with hypoxia: Secondary | ICD-10-CM | POA: Diagnosis not present

## 2019-09-11 DIAGNOSIS — I10 Essential (primary) hypertension: Secondary | ICD-10-CM | POA: Diagnosis not present

## 2019-09-11 DIAGNOSIS — J449 Chronic obstructive pulmonary disease, unspecified: Secondary | ICD-10-CM | POA: Diagnosis not present

## 2019-09-11 NOTE — Progress Notes (Deleted)
3 MONTH FOLLOW UP  Assessment:    LVH (left ventricular hypertrophy) Continue close monitoring of BP; follow up with cardiology as recommended.   Hypertensive heart disease without heart failure At goal; continue medications at this time *** Monitor blood pressure at home; call if consistently over 130/80 Continue DASH diet.   Reminder to go to the ER if any CP, SOB, nausea, dizziness, severe HA, changes vision/speech, left arm numbness and tingling and jaw pain.  Aortic atherosclerosis (HCC) Control blood pressure, cholesterol, glucose, increase exercise.   COPD with chronic bronchitis ([place]) Continue with respiratory medications; annual CXR; pulmonology f/u as indicated  Chronic respiratory failure with hypoxia, on home O2 therapy ([place]) Routinely evaluate need and status, continue home O2 therapy to maintain saturations above 92% Continue respiratory medications, daily prednisone 10 mg   Oropharyngeal dysphagia Continue to emphasize small bites, focus while eating, chew foods well  Reviewed medications today to discuss reducing pill burden  Gastroesophageal reflux disease without esophagitis Well managed on current medications Discussed diet, avoiding triggers and other lifestyle changes  Hereditary and idiopathic peripheral neuropathy Continue follow up with neurology  Parkinson's plus syndrome ([place]) Continue sinemet; follow up with neurology.   Osteoarthritis, unspecified osteoarthritis type, unspecified site Bilateral hands; s/p fusions and joint replacements? - per patient no longer with pain.   Urinary incontinence, unspecified type Wears depends; related to reduced mobility  Mixed hyperlipidemia Currently treated by pravastatin 40 mg; d/c'd today after discussion of risks and benefits to reduce pill burden Continue low cholesterol diet and exercise.  Check lipid panel routinely.   Unstable Gait Continue with walker at home; refer back to PT as needed,  discussed with patient  Prediabetes Recently well controlled A1Cs *** Discussed need to monitor with prendisone use Discussed disease and risks Discussed diet/exercise, weight management  Check A1Cs every 6 months  Vitamin D deficiency Continue supplementation for goal of 70-100 Check vitamin D level as needed  At high risk for falls Continue with walker; no falls this past year; refer back to PT as indicated  Depression, major, in remission ([place]) Doing well off of celexa ***;  xanax PRN for anxiety and O2 hunger Lifestyle discussed: diet/exerise, sleep hygiene, stress management, hydration  Protein-calorie malnutrition, severe Monitor albumin; continue ensure; encourage weight gain  Problem with medical care compliance Continue with home health medication management for poor compliance secondary to lack of knowledge; daughter is present today and will step in to help with this in the future with weekly pill box, monitoring and communication.   Pedal edema Improved; lasix PRN  Over 40 minutes of exam, counseling, chart review and critical decision making was performed Future Appointments  Date Time Provider [place]  09/12/2019  2:30 PM Liane Comber, NP [facility] None  11/30/2019 10:30 AM Narda Amber K, DO [facility] None  12/13/2019  2:00 PM Vicie Mutters, PA-C [facility] None  06/17/2020  2:00 PM [name], MD [facility] None     Subjective:  Marie [name] is a 83 y.o. Caucasian female with hypertensive heart disease, COPD, parkinson's plus syndrome, oropharyngeal dysphagia, ?chronic respiratory failure with home O2 who presents for 3 month follow up.   Restrictive Lung Dz and Parkinson's (+) with mild Dementia and limited insight has has numerous ER visits in the past for acute dyspnea compounding her moderately severe Restrictive Lung Disease when she panics and presents to the ER's. It has been long suspected that there is a large component of poor  compliance in medication regimen  of her inhalent bronchodilator therapy and husband likewise seems oblivious to her med dosing schedules and of little her in assessing her compliance.   The daughter is now more involved and managing medications and coordinate pill box weekly.    *** reducing pill burden   On home O2 therapy - 2L at home as needed to maintain saturations above 88%. Has not needed much recently. Cough is significantly improved; the patient reports she is doing well today without concerns. She has unchanged bilateral edema, weight is the same. She is on prednisone 10 mg every day for her breathing and for DJD, doing well on it.   BMI is There is no height or weight on file to calculate BMI., she has not been working on diet and exercise. Wt Readings from Last 3 Encounters:  06/06/19 140 lb (63.5 kg)  04/06/19 130 lb (59 kg)  03/30/19 145 lb (65.8 kg)   Her cholesterol is at goal. The cholesterol last visit was:   Lab Results  Component Value Date   CHOL 171 06/06/2019   HDL 57 06/06/2019   LDLCALC 93 06/06/2019   TRIG 117 06/06/2019   CHOLHDL 3.0 06/06/2019    She has been working on diet and exercise for prediabetes, and denies paresthesia of the feet, polydipsia, polyuria and visual disturbances. Last A1C in the office was:  Lab Results  Component Value Date   HGBA1C 6.3 (H) 06/06/2019    She has stable CKD II monitored via this office;  Lab Results  Component Value Date   [facility] 61 06/06/2019   Patient is *** on Vitamin D supplement.   Lab Results  Component Value Date   VD25OH 30 06/06/2019      Medication Review:  Current Outpatient Medications (Endocrine & Metabolic):  .  predniSONE (DELTASONE) 10 MG tablet, Take 1 tablet 3 x /day or as directed (Patient taking differently: Take 10 mg by mouth 2 (two) times a day. )  Current Outpatient Medications (Cardiovascular):  .  furosemide (LASIX) 20 MG tablet, Take 1 tab (20 mg daily); if weight trending up  above 148 lb with swelling or shortness of breath increase lasix for 40 mg (2 tabs) daily for 3-5 days until swelling resolves .  metoprolol tartrate (LOPRESSOR) 25 MG tablet, Take 1/2 tablet 2 x /day  Current Outpatient Medications (Respiratory):  .  albuterol (PROVENTIL) (2.5 MG/3ML) 0.083% nebulizer solution, Use 1 ampule in nebulizer 4 times a day or every 4 hours as needed to rescue asthma. (Patient taking differently: Take 2.5 mg by nebulization See admin instructions. Use 1 ampule in nebulizer 4 times a day or every 4 hours as needed to rescue asthma.) .  cetirizine (ZYRTEC) 10 MG tablet, Take 10 mg by mouth daily. .  TRELEGY ELLIPTA 100-62.5-25 MCG/INH AEPB, Inhale 1 puff into the lungs daily.   Current Outpatient Medications (Analgesics):  .  aspirin 81 MG tablet, Take 81 mg by mouth daily. Buys OTC .  meloxicam (MOBIC) 15 MG tablet, Take one tablet daily as needed for pain.  Take with food.  Avoid ibuprofen but ok to take tylenol.   Current Outpatient Medications (Other):  Marland Kitchen  ALPRAZolam (XANAX) 0.5 MG tablet, Take 1/2-1 tablet 2 - 3 x /day ONLY if needed for Anxiety Attack &  limit to 5 days /week to avoid addiction .  carbidopa-levodopa (SINEMET IR) 25-100 MG tablet, Take 2 tablet at 9am and 2 tablets at 3pm. .  Cholecalciferol (VITAMIN D) 2000 units CAPS, Take 2  capsules (4,000 Units total) by mouth daily. (Patient taking differently: Take 2,000 Units by mouth daily. ) .  omeprazole (PRILOSEC) 40 MG capsule, Take 1 capsule Daily for Indigestion & Heartburn .  OXYGEN, Inhale 2 L into the lungs daily as needed (for shortness of breath). Setting is on 2 when needed  .  sertraline (ZOLOFT) 50 MG tablet, Take 0.5 tablets (25 mg total) by mouth daily. .  vitamin C (ASCORBIC ACID) 500 MG tablet, Take 500 mg by mouth daily. .  Zinc 50 MG TABS, Take by mouth daily.   Allergies  Allergen Reactions  . Demerol  [Meperidine Hcl] Other (See Comments)    Pt. Feels like she is suffocating  .  Ertapenem Hives    Caused whole body to turn red Other reaction(s): Redness Caused whole body to turn red  . Codeine Other (See Comments)    hallucinations  . Morphine And Related Other (See Comments)    hallucinations  . Other Other (See Comments)    Invan 7- pt became red all over  . Sulfa Antibiotics Rash    Current Problems (verified) Patient Active Problem List   Diagnosis Date Noted  . Enrolled in chronic care management 03/22/2019  . Asthmatic bronchitis 01/17/2018  . Chronic diastolic CHF (congestive heart failure) ([place]) 12/15/2017  . Anxiety 12/15/2017  . Subvalvular aortic stenosis determined by imaging 11/15/2017  . Problem with medical care compliance 11/11/2017  . Aortic atherosclerosis ([place]) 10/19/2017  . Dysphagia 09/28/2017  . Chronic respiratory failure with hypoxia, on home O2 therapy ([place]) 09/20/2017  . Hypertensive heart disease 03/29/2017  . Protein-calorie malnutrition, severe 03/17/2017  . Parkinson's plus syndrome ([place]) 03/04/2017  . Restrictive lung disease 02/24/2017  . LVH (left ventricular hypertrophy) 01/07/2017  . Depression, major, in remission ([place]) 09/22/2016  . At high risk for falls 10/16/2015  . Prediabetes 08/23/2014  . Vitamin D deficiency 08/23/2014  . Medication management 08/23/2014  . Unstable Gait 01/30/2014  . Incontinence   . Hyperlipidemia   . GERD (gastroesophageal reflux disease)   . Hereditary and idiopathic peripheral neuropathy   . Essential hypertension   . Tubulovillous adenoma of rectum 09/04/2011    SURGICAL HISTORY She  has a past surgical history that includes Incontinence surgery; Rectal surgery; Appendectomy; Abdominal hysterectomy; Tonsillectomy; Eye surgery; Dilation and curettage of uterus; Rectal biopsy (09/21/2011); Finger surgery; External fixation leg (10/25/2012); Knee arthroscopy with medial menisectomy (Right, 09/19/2014); Knee arthroscopy with lateral menisectomy (Right, 09/19/2014); and Chondroplasty  (09/19/2014).   FAMILY HISTORY Her family history includes COPD in her mother; Cancer in her sister; Heart attack in her father; High blood pressure in her mother.   SOCIAL HISTORY She  reports that she has never smoked. She has never used smokeless tobacco. She reports that she does not drink alcohol or use drugs.   Review of Systems  Constitutional: Negative for malaise/fatigue and weight loss.  HENT: Negative for congestion, hearing loss, sore throat and tinnitus.   Eyes: Negative for blurred vision and double vision.  Respiratory: Negative for cough, shortness of breath and wheezing.   Cardiovascular: Negative for chest pain, palpitations, orthopnea, claudication and leg swelling.  Gastrointestinal: Negative for abdominal pain, blood in stool, constipation, diarrhea, heartburn, melena, nausea and vomiting.  Genitourinary: Negative.   Musculoskeletal: Negative for joint pain and myalgias.  Skin: Negative for rash.  Neurological: Negative for dizziness, tingling, sensory change, weakness and headaches.  Endo/Heme/Allergies: Negative for polydipsia.  Psychiatric/Behavioral: Negative.  Negative for depression and memory loss. The  patient is not nervous/anxious and does not have insomnia.   All other systems reviewed and are negative.    Objective:     There were no vitals filed for this visit. There is no height or weight on file to calculate BMI.  General appearance: alert, no distress, WD/WN, frail female HEENT: normocephalic, sclerae anicteric, TMs pearly, nares patent, no discharge or erythema, pharynx normal Oral cavity: MMM, no lesions Neck: supple, no lymphadenopathy, no thyromegaly, no masses Heart: RRR, normal S1, S2, with systolic murmur better with inspiration  Lungs: CTA bilaterally, course breath sounds left lower lung, no wheezes, rhonchi, or rales Abdomen: +bs, soft, non tender, non distended, no masses, no hepatomegaly, no splenomegaly Musculoskeletal: nontender,  no swelling, no obvious deformity Extremities: Scant edema to R foot, no cyanosis, no clubbing Pulses: 2+ symmetric, upper and lower extremities, normal cap refill Neurological: alert, oriented x 3, CN2-12 intact, strength symmetrical upper extremities and lower extremities except some decreased strength right leg, Bilateral feet with good pulses, decreased sensation up 1/3 of shin, cracking and dry skin, hammer toes and absent left 3rd toe. Patient in a wheel chair for long distances.  Psychiatric: normal affect, behavior normal, pleasant     Izora Ribas, NP   09/11/2019

## 2019-09-12 ENCOUNTER — Ambulatory Visit: Payer: Medicare Other | Admitting: Adult Health

## 2019-09-19 NOTE — Progress Notes (Signed)
Virtual Visit via Telephone Note  I connected with Marie [name] on 09/20/19 at  2:00 PM EST by telephone and verified that I am speaking with the correct person using two identifiers.  The patient/family reports does not have access to video enabled technology.   Location: Patient: home Provider: [place] office    I discussed the limitations, risks, security and privacy concerns of performing an evaluation and management service by telephone and the availability of in person appointments. I also discussed with the patient that there may be a patient responsible charge related to this service. The patient expressed understanding and agreed to proceed.  I discussed the assessment and treatment plan with the patient. The patient was provided an opportunity to ask questions and all were answered. The patient agreed with the plan and demonstrated an understanding of the instructions.   The patient was advised to call back or seek an in-person evaluation if the symptoms worsen or if the condition fails to improve as anticipated.  I provided 25 minutes of non-face-to-face time during this encounter.   Izora Ribas, NP     3 MONTH FOLLOW UP  Assessment:    LVH (left ventricular hypertrophy) Continue close monitoring of BP; follow up with cardiology as recommended.   Hypertensive heart disease without heart failure At goal; continue medications at this time  Monitor blood pressure at home; call if consistently over 130/80 Continue DASH diet.   Reminder to go to the ER if any CP, SOB, nausea, dizziness, severe HA, changes vision/speech, left arm numbness and tingling and jaw pain.  Aortic atherosclerosis (HCC) Control blood pressure, cholesterol, glucose, increase exercise.   COPD with chronic bronchitis (HCC)/restrictive lung disease Continue with respiratory medications; annual CXR; pulmonology f/u as indicated  Chronic respiratory failure with hypoxia, on home O2 therapy  ([place]) Routinely evaluate need and status, continue home O2 therapy to maintain saturations above 92% Continue inhalers (trellegy, PRN neb albuterol), daily prednisone 20 mg   Oropharyngeal dysphagia Continue to emphasize small bites, focus while eating, chew foods well  Reviewed medications today to discuss reducing pill burden  Gastroesophageal reflux disease without esophagitis Well managed on current medications Discussed diet, avoiding triggers and other lifestyle changes  Hereditary and idiopathic peripheral neuropathy Continue follow up with neurology  Parkinson's plus syndrome ([place]) Continue sinemet; follow up with neurology.   Osteoarthritis, unspecified osteoarthritis type, unspecified site Bilateral hands; s/p fusions and joint replacements, improved with prednisone   Urinary incontinence, unspecified type Wears depends; related to reduced mobility  Mixed hyperlipidemia No longer on pravastatin due to age and desire to reduce pill burden Continue low cholesterol diet and exercise.  Check lipid panel routinely.   Unstable Gait/high risk for falls Denies falls this past year  Continue with walker at home; refer back to PT as needed, discussed with patient  Prediabetes Discussed need to monitor with prendisone use Working on reducing sweets and starches Discussed disease and risks Discussed diet/exercise, weight management  Check A1Cs every 6 months  Vitamin D deficiency Continue supplementation for goal of 70-100, has increasd dose Check vitamin D level as needed  Depression, major, in remission ([place]) Doing well with low dose zoloft;  xanax PRN for anxiety and O2 hunger Lifestyle discussed: diet/exerise, sleep hygiene, stress management, hydration  Protein-calorie malnutrition, severe Monitor albumin; continue ensure; encourage weight gain  Problem with medical care compliance R/t memory and poor health literacy Improved with CMA/daughter assistance with  weekly pill box, monitoring and communication.  Pedal edema Improved; continue lasix PRN  Over 40 minutes of exam, counseling, chart review and critical decision making was performed Future Appointments  Date Time Provider [place]  11/30/2019 10:30 AM Narda Amber K, DO [facility] None  12/13/2019  2:00 PM Vicie Mutters, PA-C [facility] None  06/17/2020  2:00 PM [name], MD [facility] None     Subjective:  Marie [name] is a 83 y.o. Caucasian female with hypertensive heart disease, COPD, parkinson's plus syndrome, oropharyngeal dysphagia, restrictive lung disease/chronic respiratory failure with home O2 who is evaluated for 3 month follow up.   She is evaluated via telephone by strong preference of family due to high risk and current covid 19 pandemic trends, also very difficult to get patient out of the home due to mobility. They report do not have access to video communication technology.   The patient notably has restrictive Lung disease and Parkinson's (+), supranuclear palsy with mild Dementia and limited insight; has had numerous ER visits in the past for acute dyspnea compounding her Restrictive Lung Disease when she panics and presents to the ER's. It has been long suspected that there is a large component of poor compliance in medication regimen of her inhalent bronchodilator therapy and husband likewise oblivious to her med dosing schedules and of little her in assessing her compliance.   The daughter is now more involved and managing medications and coordinate pill box weekly with significant improvement in the past year. Reports doing much better with addition of trellegy (followed by pulm Dr. Luan Pulling in [place], but retiring and transitioning to [employer]), hasn't needed neb PRN albuterol.    She is now on zoloft 25 mg and PRN xanax for anxiety. Currently uses xanax approx once weekly with anxiety in the evening.   She follows with Dr. Posey Pronto for  Parkinson's, continues with senemet with benefit;  Over the past several months, she has become much more sedentary and walks very little, will need to transfer to the bathroom.  When she does well, her daughter has noticed that she is very stiff and spastic.  She has not suffered any falls and does use a walker.    She requires assistance for nearly all the ADLs, except for feeding and toileting.   She has a caregiver that comes from 57 to 5 PM, Latoya, CNA through [employer] in [place].   She does have GERD, oropharyngeal dysphagia  Working on reducing pill burden as possible, currently managing fairly well.   On home O2 therapy - 2L at home as needed to maintain saturations above 88%. Has not needed much recently. Denies recently dyspnea, wheezing, coughing.  She is on prednisone 10 mg BID for her breathing and for DJD, doing well on it.   BMI is There is no height or weight on file to calculate BMI., she has not been working on diet and exercise.  Wt Readings from Last 3 Encounters:  06/06/19 140 lb (63.5 kg)  04/06/19 130 lb (59 kg)  03/30/19 145 lb (65.8 kg)   She has LVH, hypertensive heart disease with preserved EF - She reports has unchanged bilateral edema, no pitting per CMA, reports weight is the same. Her blood pressure has been controlled at home, today their BP is BP: 108/64  She does not workout. She denies chest pain, shortness of breath, dizziness.  Her cholesterol is at goal. The cholesterol last visit was:   Lab Results  Component Value Date   CHOL 171 06/06/2019   HDL 57  06/06/2019   [place] 93 06/06/2019   TRIG 117 06/06/2019   CHOLHDL 3.0 06/06/2019    She has been working on diet for prediabetes (family assisting to significantly cut back on sweets), and denies paresthesia of the feet, polydipsia, polyuria and visual disturbances. Last A1C in the office was:  Lab Results  Component Value Date   HGBA1C 6.3 (H) 06/06/2019    She has stable CKD II monitored via this  office;  Lab Results  Component Value Date   GFRNONAA 61 06/06/2019   Patient is on Vitamin D supplement, has increased to 4000 IU since the last visit.    Lab Results  Component Value Date   VD25OH 30 06/06/2019      Medication Review:  Current Outpatient Medications (Endocrine & Metabolic):  .  predniSONE (DELTASONE) 10 MG tablet, Take 1 tablet 3 x /day or as directed (Patient taking differently: Take 10 mg by mouth 2 (two) times a day. )  Current Outpatient Medications (Cardiovascular):  .  furosemide (LASIX) 20 MG tablet, Take 1 tab (20 mg daily); if weight trending up above 148 lb with swelling or shortness of breath increase lasix for 40 mg (2 tabs) daily for 3-5 days until swelling resolves .  metoprolol tartrate (LOPRESSOR) 25 MG tablet, Take 1/2 tablet 2 x /day  Current Outpatient Medications (Respiratory):  .  cetirizine (ZYRTEC) 10 MG tablet, Take 10 mg by mouth daily. .  TRELEGY ELLIPTA 100-62.5-25 MCG/INH AEPB, Inhale 1 puff into the lungs daily.  Marland Kitchen  albuterol (PROVENTIL) (2.5 MG/3ML) 0.083% nebulizer solution, Use 1 ampule in nebulizer 4 times a day or every 4 hours as needed to rescue asthma. (Patient taking differently: Take 2.5 mg by nebulization See admin instructions. Use 1 ampule in nebulizer 4 times a day or every 4 hours as needed to rescue asthma.)  Current Outpatient Medications (Analgesics):  .  aspirin 81 MG tablet, Take 81 mg by mouth daily. Buys OTC .  meloxicam (MOBIC) 15 MG tablet, Take one tablet daily as needed for pain.  Take with food.  Avoid ibuprofen but ok to take tylenol.   Current Outpatient Medications (Other):  Marland Kitchen  ALPRAZolam (XANAX) 0.5 MG tablet, Take 1/2-1 tablet 2 - 3 x /day ONLY if needed for Anxiety Attack &  limit to 5 days /week to avoid addiction .  carbidopa-levodopa (SINEMET IR) 25-100 MG tablet, Take 2 tablet at 9am and 2 tablets at 3pm. .  Cholecalciferol (VITAMIN D) 2000 units CAPS, Take 2 capsules (4,000 Units total) by mouth  daily. Marland Kitchen  omeprazole (PRILOSEC) 40 MG capsule, Take 1 capsule Daily for Indigestion & Heartburn .  OXYGEN, Inhale 2 L into the lungs daily as needed (for shortness of breath). Setting is on 2 when needed  .  sertraline (ZOLOFT) 50 MG tablet, Take 0.5 tablets (25 mg total) by mouth daily. .  vitamin C (ASCORBIC ACID) 500 MG tablet, Take 500 mg by mouth daily. .  Zinc 50 MG TABS, Take by mouth daily.   Allergies  Allergen Reactions  . Demerol  [Meperidine Hcl] Other (See Comments)    Pt. Feels like she is suffocating  . Ertapenem Hives    Caused whole body to turn red Other reaction(s): Redness Caused whole body to turn red  . Codeine Other (See Comments)    hallucinations  . Morphine And Related Other (See Comments)    hallucinations  . Other Other (See Comments)    Invan 7- pt became red all over  .  Sulfa Antibiotics Rash    Current Problems (verified) Patient Active Problem List   Diagnosis Date Noted  . Enrolled in chronic care management 03/22/2019  . Asthmatic bronchitis 01/17/2018  . Chronic diastolic CHF (congestive heart failure) ([place]) 12/15/2017  . Anxiety 12/15/2017  . Subvalvular aortic stenosis determined by imaging 11/15/2017  . Problem with medical care compliance 11/11/2017  . Aortic atherosclerosis ([place]) 10/19/2017  . Dysphagia 09/28/2017  . Chronic respiratory failure with hypoxia, on home O2 therapy ([place]) 09/20/2017  . Hypertensive heart disease 03/29/2017  . Protein-calorie malnutrition, severe 03/17/2017  . Parkinson's plus syndrome ([place]) 03/04/2017  . Restrictive lung disease 02/24/2017  . LVH (left ventricular hypertrophy) 01/07/2017  . Depression, major, in remission ([place]) 09/22/2016  . At high risk for falls 10/16/2015  . Prediabetes 08/23/2014  . Vitamin D deficiency 08/23/2014  . Medication management 08/23/2014  . Unstable Gait 01/30/2014  . Incontinence   . Hyperlipidemia   . GERD (gastroesophageal reflux disease)   . Hereditary and  idiopathic peripheral neuropathy   . Essential hypertension   . Tubulovillous adenoma of rectum 09/04/2011    SURGICAL HISTORY She  has a past surgical history that includes Incontinence surgery; Rectal surgery; Appendectomy; Abdominal hysterectomy; Tonsillectomy; Eye surgery; Dilation and curettage of uterus; Rectal biopsy (09/21/2011); Finger surgery; External fixation leg (10/25/2012); Knee arthroscopy with medial menisectomy (Right, 09/19/2014); Knee arthroscopy with lateral menisectomy (Right, 09/19/2014); and Chondroplasty (09/19/2014).   FAMILY HISTORY Her family history includes COPD in her mother; Cancer in her sister; Heart attack in her father; High blood pressure in her mother.   SOCIAL HISTORY She  reports that she has never smoked. She has never used smokeless tobacco. She reports that she does not drink alcohol or use drugs.   Review of Systems  Constitutional: Negative for malaise/fatigue and weight loss.  HENT: Negative for congestion, hearing loss, sore throat and tinnitus.   Eyes: Negative for blurred vision and double vision.  Respiratory: Negative for cough, shortness of breath and wheezing.   Cardiovascular: Negative for chest pain, palpitations, orthopnea, claudication and leg swelling.  Gastrointestinal: Negative for abdominal pain, blood in stool, constipation, diarrhea, heartburn, melena, nausea and vomiting.  Genitourinary: Negative.   Musculoskeletal: Negative for falls, joint pain and myalgias.  Skin: Negative for rash.  Neurological: Negative for dizziness, tingling, sensory change, weakness and headaches.  Endo/Heme/Allergies: Negative for polydipsia.  Psychiatric/Behavioral: Negative for depression, hallucinations, memory loss, substance abuse and suicidal ideas. The patient is nervous/anxious (mild, intermittent). The patient does not have insomnia.   All other systems reviewed and are negative.    Objective:     Today's Vitals   09/20/19 1400  BP:  108/64  Pulse: 68  Temp: (!) 94.8 F (34.9 C)  SpO2: 96%   There is no height or weight on file to calculate BMI.  General : Well sounding patient in no apparent distress HEENT: no hoarseness, no cough for duration of visit Lungs: speaks in complete sentences, no audible wheezing, no apparent distress Neurological: alert, oriented x 3, slow vocalization Psychiatric: pleasant, judgement appropriate     Izora Ribas, NP   09/20/2019

## 2019-09-20 ENCOUNTER — Ambulatory Visit: Payer: Medicare Other | Admitting: Adult Health

## 2019-09-20 ENCOUNTER — Other Ambulatory Visit: Payer: Self-pay

## 2019-09-20 ENCOUNTER — Encounter: Payer: Self-pay | Admitting: Adult Health

## 2019-09-20 VITALS — BP 108/64 | HR 68 | Temp 94.8°F

## 2019-09-20 DIAGNOSIS — R269 Unspecified abnormalities of gait and mobility: Secondary | ICD-10-CM

## 2019-09-20 DIAGNOSIS — E782 Mixed hyperlipidemia: Secondary | ICD-10-CM

## 2019-09-20 DIAGNOSIS — Z9981 Dependence on supplemental oxygen: Secondary | ICD-10-CM

## 2019-09-20 DIAGNOSIS — F325 Major depressive disorder, single episode, in full remission: Secondary | ICD-10-CM | POA: Diagnosis not present

## 2019-09-20 DIAGNOSIS — Z9181 History of falling: Secondary | ICD-10-CM

## 2019-09-20 DIAGNOSIS — I700 Atherosclerosis of aorta: Secondary | ICD-10-CM | POA: Diagnosis not present

## 2019-09-20 DIAGNOSIS — G232 Striatonigral degeneration: Secondary | ICD-10-CM

## 2019-09-20 DIAGNOSIS — R1312 Dysphagia, oropharyngeal phase: Secondary | ICD-10-CM

## 2019-09-20 DIAGNOSIS — J9611 Chronic respiratory failure with hypoxia: Secondary | ICD-10-CM | POA: Diagnosis not present

## 2019-09-20 DIAGNOSIS — I517 Cardiomegaly: Secondary | ICD-10-CM

## 2019-09-20 DIAGNOSIS — J984 Other disorders of lung: Secondary | ICD-10-CM

## 2019-09-20 DIAGNOSIS — I10 Essential (primary) hypertension: Secondary | ICD-10-CM

## 2019-09-20 DIAGNOSIS — K219 Gastro-esophageal reflux disease without esophagitis: Secondary | ICD-10-CM

## 2019-09-20 DIAGNOSIS — I119 Hypertensive heart disease without heart failure: Secondary | ICD-10-CM

## 2019-09-20 DIAGNOSIS — Z79899 Other long term (current) drug therapy: Secondary | ICD-10-CM

## 2019-09-20 DIAGNOSIS — F419 Anxiety disorder, unspecified: Secondary | ICD-10-CM

## 2019-09-20 DIAGNOSIS — I5032 Chronic diastolic (congestive) heart failure: Secondary | ICD-10-CM | POA: Diagnosis not present

## 2019-09-20 DIAGNOSIS — R7303 Prediabetes: Secondary | ICD-10-CM

## 2019-09-20 DIAGNOSIS — E43 Unspecified severe protein-calorie malnutrition: Secondary | ICD-10-CM

## 2019-09-20 DIAGNOSIS — G20C Parkinsonism, unspecified: Secondary | ICD-10-CM

## 2019-09-20 DIAGNOSIS — E559 Vitamin D deficiency, unspecified: Secondary | ICD-10-CM

## 2019-10-03 ENCOUNTER — Other Ambulatory Visit: Payer: Self-pay

## 2019-10-03 ENCOUNTER — Ambulatory Visit: Payer: Medicare Other | Attending: Internal Medicine

## 2019-10-03 DIAGNOSIS — Z20822 Contact with and (suspected) exposure to covid-19: Secondary | ICD-10-CM

## 2019-10-04 LAB — NOVEL CORONAVIRUS, NAA: SARS-CoV-2, NAA: NOT DETECTED

## 2019-10-18 ENCOUNTER — Other Ambulatory Visit: Payer: Self-pay

## 2019-10-18 ENCOUNTER — Ambulatory Visit (INDEPENDENT_AMBULATORY_CARE_PROVIDER_SITE_OTHER): Payer: Medicare Other | Admitting: Adult Health

## 2019-10-18 ENCOUNTER — Encounter: Payer: Self-pay | Admitting: Adult Health

## 2019-10-18 VITALS — BP 126/62 | HR 71 | Temp 97.5°F

## 2019-10-18 DIAGNOSIS — R21 Rash and other nonspecific skin eruption: Secondary | ICD-10-CM

## 2019-10-18 MED ORDER — HYDROXYZINE HCL 25 MG PO TABS: 25.0000 mg | ORAL_TABLET | Freq: Four times a day (QID) | ORAL | 0 refills | Status: DC | PRN

## 2019-10-18 MED ORDER — TRIAMCINOLONE ACETONIDE 0.1 % EX OINT: 1.0000 "application " | TOPICAL_OINTMENT | Freq: Two times a day (BID) | CUTANEOUS | 1 refills | Status: DC

## 2019-10-18 NOTE — Progress Notes (Signed)
Assessment and Plan:  Marie [name] was seen today for rash.  Diagnoses and all orders for this visit:  Rash of neck Primarily excoriation marks today; suspect dry skin with lack of moisturizing routine in last 2 weeks with caregiver out and cooler weather  Advised luke warm water for bathing; gentle exfoliation with warm wet washcloth Moisturize with previous moisturizer regimen immediately after bathing, BID if needed Wait 30 min then apply triamcinolone cream BID PRN itching If severe/persistent over the weekend may pick up low dose hydroxyzine to prevent her from scratching/irritating area; discussed sedation risks, increased falls in elders; use low dose only as needed sparingly, stop immediately if any new falls, excess sedation Follow up if any worse, or with erythema of excoriation marks consider doxycycline  -     triamcinolone ointment (KENALOG) 0.1 %; Apply 1 application topically 2 (two) times daily. -     hydrOXYzine (ATARAX/VISTARIL) 25 MG tablet; Take 1 tablet (25 mg total) by mouth every 6 (six) hours as needed for itching. Use sparingly; may cause sedation.   Further disposition pending results of labs. Discussed med's effects and SE's.   Over 15 minutes of exam, counseling, chart review, and critical decision making was performed.   Future Appointments  Date Time Provider [place]  11/30/2019 10:30 AM Narda Amber K, DO [facility] None  12/13/2019  2:00 PM Vicie Mutters, PA-C [facility] None  06/17/2020  2:00 PM [name], MD [facility] None    ------------------------------------------------------------------------------------------------------------------   HPI BP 126/62   Pulse 71   Temp (!) 97.5 F (36.4 C)   SpO2 95%   83 y.o.female presents for evaluation of a pruritic rash to base of neck/left shoulder x 2 weeks.  Daughter and husband accompany her today;   Reports has had intermittent pruritic rash to abdomen and back, has resolved with OTC  hydrocortisone. New rash to shoulder x 2 weeks, pruritic per patient, has been scratching constantly per daughter.   She reports no significant changes, not spreading, has been doing hydrocortisone 1% with aloe BID which has helped itching but not to resolve. Denies fever/chills, HA, URI sx, abdominal sx, arthralgias, myalgias, fatigue. Denies new detergents, foods, soaps. Denies burning, discharge.  Notably her caregiver who typically assists with bathing and moisturizing has been out x 2 weeks, does vaseline and moisturizer mix that works well.   Past Medical History:  Diagnosis Date  . Balance disorder 2008.     Falls a lot.  She can be standing and then leans too far over to one side   . Bulging disc   . Chronic bronchitis ([place])    due to congestion at times, on prednisone and advair  . COPD (chronic obstructive pulmonary disease) ([place])   . COPD with acute exacerbation ([place]) 12/15/2017  . Depression    many years ago  . GERD (gastroesophageal reflux disease)   . Hearing loss    mild  . Hyperlipidemia   . Hypertension   . Iatrogenic adrenal insufficiency ([place])   . Incontinence    not indicated at this visit.  Marland Kitchen LVH (left ventricular hypertrophy) due to hypertensive disease    a. 02/2017: echo showing an EF of 65-70% with moderate LVH, hyperdynamic LV with subvalvular gradient of 70 mmHg, SAM, and mild MR  . Neuropathy    legs stay numb   . Osteoarthritis    hands,   . Osteoporosis   . Shortness of breath dyspnea   . Tubulovillous adenoma of rectum   .  Wears dentures   . Wears glasses      Allergies  Allergen Reactions  . Demerol  [Meperidine Hcl] Other (See Comments)    Pt. Feels like she is suffocating  . Ertapenem Hives    Caused whole body to turn red Other reaction(s): Redness Caused whole body to turn red  . Codeine Other (See Comments)    hallucinations  . Morphine And Related Other (See Comments)    hallucinations  . Other Other (See Comments)    Invan 7-  pt became red all over  . Sulfa Antibiotics Rash    Current Outpatient Medications on File Prior to Visit  Medication Sig  . albuterol (PROVENTIL) (2.5 MG/3ML) 0.083% nebulizer solution Use 1 ampule in nebulizer 4 times a day or every 4 hours as needed to rescue asthma. (Patient taking differently: Take 2.5 mg by nebulization See admin instructions. Use 1 ampule in nebulizer 4 times a day or every 4 hours as needed to rescue asthma.)  . ALPRAZolam (XANAX) 0.5 MG tablet Take 1/2-1 tablet 2 - 3 x /day ONLY if needed for Anxiety Attack &  limit to 5 days /week to avoid addiction  . aspirin 81 MG tablet Take 81 mg by mouth daily. Buys OTC  . carbidopa-levodopa (SINEMET IR) 25-100 MG tablet Take 2 tablet at 9am and 2 tablets at 3pm.  . cetirizine (ZYRTEC) 10 MG tablet Take 10 mg by mouth daily.  . Cholecalciferol (VITAMIN D) 2000 units CAPS Take 2 capsules (4,000 Units total) by mouth daily.  . furosemide (LASIX) 20 MG tablet Take 1 tab (20 mg daily); if weight trending up above 148 lb with swelling or shortness of breath increase lasix for 40 mg (2 tabs) daily for 3-5 days until swelling resolves (Patient taking differently: Take 1 tab (20 mg daily); if weight trending up above 148 lb with swelling or shortness of breath increase lasix for 40 mg (2 tabs) daily for 3-5 days until swelling resolves  Takes 1/2 tablet daily)  . meloxicam (MOBIC) 15 MG tablet Take one tablet daily as needed for pain.  Take with food.  Avoid ibuprofen but ok to take tylenol.  . metoprolol tartrate (LOPRESSOR) 25 MG tablet Take 1/2 tablet 2 x /day  . omeprazole (PRILOSEC) 40 MG capsule Take 1 capsule Daily for Indigestion & Heartburn  . OXYGEN Inhale 2 L into the lungs daily as needed (for shortness of breath). Setting is on 2 when needed   . predniSONE (DELTASONE) 10 MG tablet Take 1 tablet 3 x /day or as directed (Patient taking differently: Take 10 mg by mouth 2 (two) times a day. )  . sertraline (ZOLOFT) 50 MG tablet  Take 0.5 tablets (25 mg total) by mouth daily.  . TRELEGY ELLIPTA 100-62.5-25 MCG/INH AEPB Inhale 1 puff into the lungs daily.   . vitamin C (ASCORBIC ACID) 500 MG tablet Take 500 mg by mouth daily.  . Zinc 50 MG TABS Take by mouth daily.   No current facility-administered medications on file prior to visit.    ROS: all negative except above.   Physical Exam:  BP 126/62   Pulse 71   Temp (!) 97.5 F (36.4 C)   SpO2 95%   General Appearance: Well nourished, well dressed elder in no apparent distress. Eyes: PERRLA, conjunctiva no swelling or erythema ENT/Mouth: mask in place; oral exam deferred. Hearing normal.  Neck: Supple, thyroid normal.  Respiratory: Respiratory effort normal Abdomen: Soft, + BS.  Non tender, no guarding,  rebound, hernias, masses. Lymphatics: Non tender without lymphadenopathy.  Musculoskeletal: Generalized decrease in muscle mass, power & decreased tone with stooped posture. In Wheelchair.  Skin: Warm, dry; she has excoriation marks without visible distinct lesions to neck and upper shoulders without discharge, notable erythema, scaling, or other abnormality.  Neuro: Sensation intact.  Psych: Awake and oriented X 3, normal affect, Insight and Judgment appropriate.     Izora Ribas, NP 3:10 PM [facility]

## 2019-10-18 NOTE — Patient Instructions (Signed)
Moisturizer daily   Avoid very hot water - use luke warm  If itching use warm washcloth to gently rub  Then do moisturizer  30 min later topical steroid cream   Itching medication only if needed spraingly    Hydroxyzine capsules or tablets What is this medicine? HYDROXYZINE (hye [place] i zeen) is an antihistamine. This medicine is used to treat allergy symptoms. It is also used to treat anxiety and tension. This medicine can be used with other medicines to induce sleep before surgery. This medicine may be used for other purposes; ask your health care provider or pharmacist if you have questions. COMMON BRAND NAME(S): ANX, Atarax, Rezine, Vistaril What should I tell my health care provider before I take this medicine? They need to know if you have any of these conditions:  glaucoma  heart disease  history of irregular heartbeat  kidney disease  liver disease  lung or breathing disease, like asthma  stomach or intestine problems  thyroid disease  trouble passing urine  an unusual or allergic reaction to hydroxyzine, cetirizine, other medicines, foods, dyes or preservatives  pregnant or trying to get pregnant  breast-feeding How should I use this medicine? Take this medicine by mouth with a full glass of water. Follow the directions on the prescription label. You may take this medicine with food or on an empty stomach. Take your medicine at regular intervals. Do not take your medicine more often than directed. Talk to your pediatrician regarding the use of this medicine in children. Special care may be needed. While this drug may be prescribed for children as young as 62 years of age for selected conditions, precautions do apply. Patients over 40 years old may have a stronger reaction and need a smaller dose. Overdosage: If you think you have taken too much of this medicine contact a poison control center or emergency room at once. NOTE: This medicine is only for you. Do  not share this medicine with others. What if I miss a dose? If you miss a dose, take it as soon as you can. If it is almost time for your next dose, take only that dose. Do not take double or extra doses. What may interact with this medicine? Do not take this medicine with any of the following medications:  cisapride  dronedarone  pimozide  thioridazine This medicine may also interact with the following medications:  alcohol  antihistamines for allergy, cough, and cold  atropine  barbiturate medicines for sleep or seizures, like phenobarbital  certain antibiotics like erythromycin or clarithromycin  certain medicines for anxiety or sleep  certain medicines for bladder problems like oxybutynin, tolterodine  certain medicines for depression or psychotic disturbances  certain medicines for irregular heart beat  certain medicines for Parkinson's disease like benztropine, trihexyphenidyl  certain medicines for seizures like phenobarbital, primidone  certain medicines for stomach problems like dicyclomine, hyoscyamine  certain medicines for travel sickness like scopolamine  ipratropium  narcotic medicines for pain  other medicines that prolong the QT interval (an abnormal heart rhythm) like dofetilide This list may not describe all possible interactions. Give your health care provider a list of all the medicines, herbs, non-prescription drugs, or dietary supplements you use. Also tell them if you smoke, drink alcohol, or use illegal drugs. Some items may interact with your medicine. What should I watch for while using this medicine? Tell your doctor or health care professional if your symptoms do not improve. You may get drowsy or dizzy. Do not  drive, use machinery, or do anything that needs mental alertness until you know how this medicine affects you. Do not stand or sit up quickly, especially if you are an older patient. This reduces the risk of dizzy or fainting spells.  Alcohol may interfere with the effect of this medicine. Avoid alcoholic drinks. Your mouth may get dry. Chewing sugarless gum or sucking hard candy, and drinking plenty of water may help. Contact your doctor if the problem does not go away or is severe. This medicine may cause dry eyes and blurred vision. If you wear contact lenses you may feel some discomfort. Lubricating drops may help. See your eye doctor if the problem does not go away or is severe. If you are receiving skin tests for allergies, tell your doctor you are using this medicine. What side effects may I notice from receiving this medicine? Side effects that you should report to your doctor or health care professional as soon as possible:  allergic reactions like skin rash, itching or hives, swelling of the face, lips, or tongue  changes in vision  confusion  fast, irregular heartbeat  seizures  tremor  trouble passing urine or change in the amount of urine Side effects that usually do not require medical attention (report to your doctor or health care professional if they continue or are bothersome):  constipation  drowsiness  dry mouth  headache  tiredness This list may not describe all possible side effects. Call your doctor for medical advice about side effects. You may report side effects to FDA at 1-800-FDA-1088. Where should I keep my medicine? Keep out of the reach of children. Store at room temperature between 15 and 30 degrees C (59 and 86 degrees F). Keep container tightly closed. Throw away any unused medicine after the expiration date. NOTE: This sheet is a summary. It may not cover all possible information. If you have questions about this medicine, talk to your doctor, pharmacist, or health care provider.  2020 Elsevier/Gold Standard (2018-09-26 13:19:55)

## 2019-10-30 ENCOUNTER — Encounter: Payer: Self-pay | Admitting: Adult Health

## 2019-10-30 ENCOUNTER — Other Ambulatory Visit: Payer: Self-pay

## 2019-10-30 ENCOUNTER — Ambulatory Visit (INDEPENDENT_AMBULATORY_CARE_PROVIDER_SITE_OTHER): Payer: Medicare Other | Admitting: Adult Health

## 2019-10-30 VITALS — BP 110/64 | HR 72 | Temp 97.2°F | Resp 18

## 2019-10-30 DIAGNOSIS — L299 Pruritus, unspecified: Secondary | ICD-10-CM

## 2019-10-30 MED ORDER — TRIAMCINOLONE ACETONIDE 0.1 % EX OINT: 1.0000 "application " | TOPICAL_OINTMENT | Freq: Two times a day (BID) | CUTANEOUS | 1 refills | Status: AC

## 2019-10-30 MED ORDER — GABAPENTIN 100 MG PO CAPS: 100.0000 mg | ORAL_CAPSULE | Freq: Three times a day (TID) | ORAL | 2 refills | Status: DC | PRN

## 2019-10-30 NOTE — Patient Instructions (Addendum)
Ivory snow  Draft   Are good mild detergents   Pruritus Pruritus is an itchy feeling on the skin. One of the most common causes is dry skin, but many different things can cause itching. Most cases of itching do not require medical attention. Sometimes itchy skin can turn into a rash. Follow these instructions at home: Skin care   Apply moisturizing lotion to your skin as needed. Lotion that contains petroleum jelly is best.  Take medicines or apply medicated creams only as told by your health care provider. This may include: ? Corticosteroid cream. ? Anti-itch lotions. ? Oral antihistamines.  Apply a cool, wet cloth (cool compress) to the affected areas.  Take baths with one of the following: ? Epsom salts. You can get these at your local pharmacy or grocery store. Follow the instructions on the packaging. ? Baking soda. Pour a small amount into the bath as told by your health care provider. ? Colloidal oatmeal. You can get this at your local pharmacy or grocery store. Follow the instructions on the packaging.  Apply baking soda paste to your skin. To make the paste, stir water into a small amount of baking soda until it reaches a paste-like consistency.  Do not scratch your skin.  Do not take hot showers or baths, which can make itching worse. A cool shower may help with itching as long as you apply moisturizing lotion after the shower.  Do not use scented soaps, detergents, perfumes, and cosmetic products. Instead, use gentle, unscented versions of these items. General instructions  Avoid wearing tight clothes.  Keep a journal to help find out what is causing your itching. Write down: ? What you eat and drink. ? What cosmetic products you use. ? What soaps or detergents you use. ? What you wear, including jewelry.  Use a humidifier. This keeps the air moist, which helps to prevent dry skin.  Be aware of any changes in your itchiness. Contact a health care provider  if:  The itching does not go away after several days.  You are unusually thirsty or urinating more than normal.  Your skin tingles or feels numb.  Your skin or the white parts of your eyes turn yellow (jaundice).  You feel weak.  You have any of the following: ? Night sweats. ? Tiredness (fatigue). ? Weight loss. ? Abdominal pain. Summary  Pruritus is an itchy feeling on the skin. One of the most common causes is dry skin, but many different conditions and factors can cause itching.  Apply moisturizing lotion to your skin as needed. Lotion that contains petroleum jelly is best.  Take medicines or apply medicated creams only as told by your health care provider.  Do not take hot showers or baths. Do not use scented soaps, detergents, perfumes, or cosmetic products. This information is not intended to replace advice given to you by your health care provider. Make sure you discuss any questions you have with your health care provider. Document Revised: 10/19/2017 Document Reviewed: 10/19/2017 Elsevier Patient Education  2020 [employer].

## 2019-10-30 NOTE — Progress Notes (Signed)
Assessment and Plan:  Marie [name] was seen today for follow-up.  Diagnoses and all orders for this visit:  Pruritus of skin ? R/t dry skin and new detergent; no observed rash or other abnormality recommended switch back to previous, or ivory snow or draft brand products Recommended continue prednisone, vistaril, topical steroid PRN, topical aloe/benadryl gel Suggested oatmeal baths, aveeno, etc oat or lanolin based creams may be soothing Recommended trim nails short, keep skin covered, try ice cubes or other distraction/sensations  Suspect cyclical/habitual element  Per recommendation by Dr. Melford Aase will trial low dose gabapentin to assist Contact office if new symptoms; if persistent despite above can refer to dermatology  -     gabapentin (NEURONTIN) 100 MG capsule; Take 1-2 capsules (100-200 mg total) by mouth 3 (three) times daily as needed. -     triamcinolone ointment (KENALOG) 0.1 %; Apply 1 application topically 2 (two) times daily.  Further disposition pending results of labs. Discussed med's effects and SE's.   Over 30 minutes of exam, counseling, chart review, and critical decision making was performed.   Future Appointments  Date Time Provider [place]  11/02/2019 10:50 AM Narda Amber K, DO [facility] None  12/13/2019  2:00 PM Vicie Mutters, PA-C [facility] None  06/17/2020  2:00 PM [name], MD [facility] None    ------------------------------------------------------------------------------------------------------------------   HPI BP 110/64   Pulse 72   Temp (!) 97.2 F (36.2 C)   Resp 18   83 y.o.female presents for evaluation of persistent pruritis. She was seen 10/18/2019 for "rash" of neck/toro with itching, no observed rash by myself or family, mainly with excoriation marks. Denies new detergents or products at that time, but today states did change detergent, stopped as of this AM. Denies change, just not getting any better. Resumed previous  moisturizing routine which she had d/c'd with home health nurse on vacation, on prednisone chronically for breathing, topical triamcinolone 1% BID, topical aloe/anti-itch produces, vistaril but still unable to stop scratching.   Denies any other sx.   Past Medical History:  Diagnosis Date  . Balance disorder 2008.     Falls a lot.  She can be standing and then leans too far over to one side   . Bulging disc   . Chronic bronchitis ([place])    due to congestion at times, on prednisone and advair  . COPD (chronic obstructive pulmonary disease) ([place])   . COPD with acute exacerbation ([place]) 12/15/2017  . Depression    many years ago  . GERD (gastroesophageal reflux disease)   . Hearing loss    mild  . Hyperlipidemia   . Hypertension   . Iatrogenic adrenal insufficiency ([place])   . Incontinence    not indicated at this visit.  Marland Kitchen LVH (left ventricular hypertrophy) due to hypertensive disease    a. 02/2017: echo showing an EF of 65-70% with moderate LVH, hyperdynamic LV with subvalvular gradient of 70 mmHg, SAM, and mild MR  . Neuropathy    legs stay numb   . Osteoarthritis    hands,   . Osteoporosis   . Shortness of breath dyspnea   . Tubulovillous adenoma of rectum   . Wears dentures   . Wears glasses      Allergies  Allergen Reactions  . Demerol  [Meperidine Hcl] Other (See Comments)    Pt. Feels like she is suffocating  . Ertapenem Hives    Caused whole body to turn red Other reaction(s): Redness Caused whole body to turn red  .  Codeine Other (See Comments)    hallucinations  . Morphine And Related Other (See Comments)    hallucinations  . Other Other (See Comments)    Invan 7- pt became red all over  . Sulfa Antibiotics Rash    Current Outpatient Medications on File Prior to Visit  Medication Sig  . ALPRAZolam (XANAX) 0.5 MG tablet Take 1/2-1 tablet 2 - 3 x /day ONLY if needed for Anxiety Attack &  limit to 5 days /week to avoid addiction  . aspirin 81 MG tablet Take 81  mg by mouth daily. Buys OTC  . carbidopa-levodopa (SINEMET IR) 25-100 MG tablet Take 2 tablet at 9am and 2 tablets at 3pm.  . cetirizine (ZYRTEC) 10 MG tablet Take 10 mg by mouth daily.  . Cholecalciferol (VITAMIN D) 2000 units CAPS Take 2 capsules (4,000 Units total) by mouth daily.  . furosemide (LASIX) 20 MG tablet Take 1 tab (20 mg daily); if weight trending up above 148 lb with swelling or shortness of breath increase lasix for 40 mg (2 tabs) daily for 3-5 days until swelling resolves (Patient taking differently: Take 1 tab (20 mg daily); if weight trending up above 148 lb with swelling or shortness of breath increase lasix for 40 mg (2 tabs) daily for 3-5 days until swelling resolves  Takes 1/2 tablet daily)  . hydrOXYzine (ATARAX/VISTARIL) 25 MG tablet Take 1 tablet (25 mg total) by mouth every 6 (six) hours as needed for itching. Use sparingly; may cause sedation.  . meloxicam (MOBIC) 15 MG tablet Take one tablet daily as needed for pain.  Take with food.  Avoid ibuprofen but ok to take tylenol.  . metoprolol tartrate (LOPRESSOR) 25 MG tablet Take 1/2 tablet 2 x /day  . omeprazole (PRILOSEC) 40 MG capsule Take 1 capsule Daily for Indigestion & Heartburn  . OXYGEN Inhale 2 L into the lungs daily as needed (for shortness of breath). Setting is on 2 when needed   . predniSONE (DELTASONE) 10 MG tablet Take 1 tablet 3 x /day or as directed (Patient taking differently: Take 10 mg by mouth 2 (two) times a day. )  . sertraline (ZOLOFT) 50 MG tablet Take 0.5 tablets (25 mg total) by mouth daily.  . TRELEGY ELLIPTA 100-62.5-25 MCG/INH AEPB Inhale 1 puff into the lungs daily.   Marland Kitchen triamcinolone ointment (KENALOG) 0.1 % Apply 1 application topically 2 (two) times daily.  . vitamin C (ASCORBIC ACID) 500 MG tablet Take 500 mg by mouth daily.  . Zinc 50 MG TABS Take by mouth daily.  Marland Kitchen albuterol (PROVENTIL) (2.5 MG/3ML) 0.083% nebulizer solution Use 1 ampule in nebulizer 4 times a day or every 4 hours as  needed to rescue asthma. (Patient taking differently: Take 2.5 mg by nebulization See admin instructions. Use 1 ampule in nebulizer 4 times a day or every 4 hours as needed to rescue asthma.)   No current facility-administered medications on file prior to visit.    ROS: all negative except above.   Physical Exam:  BP 110/64   Pulse 72   Temp (!) 97.2 F (36.2 C)   Resp 18   General Appearance: Well nourished, well dressed elder in no apparent distress. Eyes: PERRLA, conjunctiva no swelling or erythema ENT/Mouth: mask in place; oral exam deferred. Hearing normal.  Neck: Supple, thyroid normal.  Respiratory: Respiratory effort normal Abdomen: Soft, + BS.  Non tender, no guarding, rebound, hernias, masses. Lymphatics: Non tender without lymphadenopathy.  Musculoskeletal: Generalized decrease in muscle mass,  power & decreased tone with stooped posture. In Wheelchair.  Skin: Warm, dry; she has excoriation marks without visible distinct lesions to post neck and upper back, bil forearms without discharge, notable erythema, scaling, or other abnormality. No notable rash or lesions to torso/back/abdomen Neuro: Sensation intact.  Psych: Awake and oriented X 3, normal affect, Insight and Judgment fair. She is observed picking at skin during visit. Denies visual/tactile hallucinations.     Izora Ribas, NP 2:38 PM [facility]

## 2019-11-01 ENCOUNTER — Encounter: Payer: Self-pay | Admitting: Neurology

## 2019-11-01 ENCOUNTER — Telehealth: Payer: Self-pay | Admitting: Neurology

## 2019-11-01 NOTE — Telephone Encounter (Signed)
States that she is returning a call to our office and is very upset that she was told that she could not be talked to by our office. She is on the DPR  As follows  DPR signed authorizing all [facility], S to release information to Etheleen Mayhew (daughter), OK to leave message on phone number [phone].    Please call her back

## 2019-11-01 NOTE — Telephone Encounter (Signed)
Spoke with Jan and explained when looking previously I seen the husbands name and some how did not see hers. Apologized for any inconvenience. Went over patients medications, no recent vitals.

## 2019-11-02 ENCOUNTER — Other Ambulatory Visit: Payer: Self-pay

## 2019-11-02 ENCOUNTER — Telehealth (INDEPENDENT_AMBULATORY_CARE_PROVIDER_SITE_OTHER): Payer: Medicare Other | Admitting: Neurology

## 2019-11-02 DIAGNOSIS — G231 Progressive supranuclear ophthalmoplegia [Steele-Richardson-Olszewski]: Secondary | ICD-10-CM

## 2019-11-02 DIAGNOSIS — M21372 Foot drop, left foot: Secondary | ICD-10-CM

## 2019-11-02 NOTE — Progress Notes (Signed)
Virtual Visit via Telephone Note The purpose of this virtual visit is to provide medical care while limiting exposure to the novel coronavirus.    Consent was obtained for phone visit:  Yes.   Answered questions that patient had about telehealth interaction:  Yes.   I discussed the limitations, risks, security and privacy concerns of performing an evaluation and management service by telephone. I also discussed with the patient that there may be a patient responsible charge related to this service. The patient expressed understanding and agreed to proceed.  Pt location: Home Physician Location: office Name of referring provider:  [name], MD I connected with .Marie [name] at patients initiation/request on 11/02/2019 at 10:50 AM EST by telephone and verified that I am speaking with the correct person using two identifiers.  Pt MRN:  [id number] Pt DOB:  [birth year]   History of Present Illness:  This is an 84 year-old female returning for follow-up of progressive supranuclear palsy.  It continues to be a very slow deterioration in her overall health and independent functioning.  Although, her daughter has not noticed a marked change since her last visit.  She continues to require assistance for nearly all ADLs, except for feeding.  She needs assistance with transfers in the bathroom.  She continues to use a walker and has not suffered any falls.  She has a caregiver, Phineas Real, that comes 5 days a week from 9 AM to 5 PM.  She is taking Sinemet 2 tablets in the afternoon, she is unable to take it twice daily.  Seems that she is taking all of her medications in the afternoon as she has difficulty with more than 1 day dosing.  Appetite is poor and she has lost about 30 pounds over the past year.  She does take Ensure daily.  Recently, she saw her PCP for itching and was started on gabapentin.  Her mood is good and she denies any pain.  Taking multiple naps during the day.   Assessment and  Plan:   1.  Advanced progressive supranuclear palsy, Parkinson plus syndrome.  She is chronically debilitated from her condition and has dependent on nearly all ADLs, except for feeding.  Daughter feels that caregiver support is adequate at this time.  She will stay on Sinemet 2 tablets at 3 PM.  PSP is not always dopa responsive is on a parkinsonian syndromes, so I am okay without further titrating the dose that she is unable to tolerate twice daily dosing.  Encouraged her to continue to eat well and supplement with Ensure as needed. Going forward, consider palliative care as it may become increasingly difficult for patient to make office visits  2.  Left foot weakness, likely due to L5 radiculopathy.  Unchanged.  Not a surgical candidate and therefore further testing with imaging was not performed.   Follow Up Instructions:   I discussed the assessment and treatment plan with the patient. The patient was provided an opportunity to ask questions and all were answered. The patient agreed with the plan and demonstrated an understanding of the instructions.   The patient was advised to call back or seek an in-person evaluation if the symptoms worsen or if the condition fails to improve as anticipated.    Total Time spent in visit with the patient was:  15 min, of which 100% of the time was spent in counseling and/or coordinating care.   Pt understands and agrees with the plan of care outlined.  Alda Berthold, DO

## 2019-11-06 ENCOUNTER — Telehealth: Payer: Self-pay | Admitting: *Deleted

## 2019-11-06 ENCOUNTER — Other Ambulatory Visit: Payer: Self-pay | Admitting: Internal Medicine

## 2019-11-06 MED ORDER — MIRTAZAPINE 15 MG PO TABS: ORAL_TABLET | ORAL | 1 refills | Status: DC

## 2019-11-06 NOTE — Telephone Encounter (Signed)
Daughter called and reported the patient has had no appetite for 3 to 4 days and is throwing up her medications, since no food on her stomach.  Per Dr Melford Aase, encourage the patient to eat foods, such as oatmeal, to coat her stomach.  Dr Melford Aase sent in an RX for medication to increase her appetite. Daughter is aware.

## 2019-11-07 ENCOUNTER — Emergency Department (HOSPITAL_COMMUNITY)
Admission: EM | Admit: 2019-11-07 | Discharge: 2019-11-07 | Disposition: A | Discharge status: Home / Self Care | Payer: Medicare Other | Attending: Emergency Medicine | Admitting: Emergency Medicine

## 2019-11-07 ENCOUNTER — Encounter (HOSPITAL_COMMUNITY): Payer: Self-pay | Admitting: Emergency Medicine

## 2019-11-07 ENCOUNTER — Other Ambulatory Visit: Payer: Self-pay

## 2019-11-07 ENCOUNTER — Emergency Department (HOSPITAL_COMMUNITY): Payer: Medicare Other

## 2019-11-07 DIAGNOSIS — Z20822 Contact with and (suspected) exposure to covid-19: Secondary | ICD-10-CM | POA: Insufficient documentation

## 2019-11-07 DIAGNOSIS — R0789 Other chest pain: Secondary | ICD-10-CM | POA: Diagnosis not present

## 2019-11-07 DIAGNOSIS — Z79899 Other long term (current) drug therapy: Secondary | ICD-10-CM | POA: Diagnosis not present

## 2019-11-07 DIAGNOSIS — Z7982 Long term (current) use of aspirin: Secondary | ICD-10-CM | POA: Insufficient documentation

## 2019-11-07 DIAGNOSIS — N390 Urinary tract infection, site not specified: Secondary | ICD-10-CM

## 2019-11-07 DIAGNOSIS — R079 Chest pain, unspecified: Secondary | ICD-10-CM

## 2019-11-07 DIAGNOSIS — R000 Tachycardia, unspecified: Secondary | ICD-10-CM | POA: Diagnosis not present

## 2019-11-07 DIAGNOSIS — R1111 Vomiting without nausea: Secondary | ICD-10-CM | POA: Diagnosis not present

## 2019-11-07 DIAGNOSIS — I110 Hypertensive heart disease with heart failure: Secondary | ICD-10-CM | POA: Insufficient documentation

## 2019-11-07 DIAGNOSIS — Z743 Need for continuous supervision: Secondary | ICD-10-CM | POA: Diagnosis not present

## 2019-11-07 DIAGNOSIS — J449 Chronic obstructive pulmonary disease, unspecified: Secondary | ICD-10-CM | POA: Diagnosis not present

## 2019-11-07 DIAGNOSIS — R52 Pain, unspecified: Secondary | ICD-10-CM | POA: Diagnosis not present

## 2019-11-07 DIAGNOSIS — R1084 Generalized abdominal pain: Secondary | ICD-10-CM | POA: Diagnosis not present

## 2019-11-07 DIAGNOSIS — I5032 Chronic diastolic (congestive) heart failure: Secondary | ICD-10-CM | POA: Insufficient documentation

## 2019-11-07 LAB — CBC
HCT: 46.4 % — ABNORMAL HIGH (ref 36.0–46.0)
Hemoglobin: 13.5 g/dL (ref 12.0–15.0)
MCH: 24.5 pg — ABNORMAL LOW (ref 26.0–34.0)
MCHC: 29.1 g/dL — ABNORMAL LOW (ref 30.0–36.0)
MCV: 84.2 fL (ref 80.0–100.0)
Platelets: 304 10*3/uL (ref 150–400)
RBC: 5.51 MIL/uL — ABNORMAL HIGH (ref 3.87–5.11)
RDW: 15.8 % — ABNORMAL HIGH (ref 11.5–15.5)
WBC: 9.2 10*3/uL (ref 4.0–10.5)
nRBC: 0 % (ref 0.0–0.2)

## 2019-11-07 LAB — COMPREHENSIVE METABOLIC PANEL
ALT: 5 U/L (ref 0–44)
AST: 18 U/L (ref 15–41)
Albumin: 3.8 g/dL (ref 3.5–5.0)
Alkaline Phosphatase: 54 U/L (ref 38–126)
Anion gap: 10 (ref 5–15)
BUN: 19 mg/dL (ref 8–23)
CO2: 27 mmol/L (ref 22–32)
Calcium: 8.8 mg/dL — ABNORMAL LOW (ref 8.9–10.3)
Chloride: 105 mmol/L (ref 98–111)
Creatinine, Ser: 0.87 mg/dL (ref 0.44–1.00)
GFR calc Af Amer: >60 mL/min (ref >60)
GFR calc non Af Amer: >60 mL/min (ref >60)
Glucose, Bld: 134 mg/dL — ABNORMAL HIGH (ref 70–99)
Potassium: 3.7 mmol/L (ref 3.5–5.1)
Sodium: 142 mmol/L (ref 135–145)
Total Bilirubin: 0.7 mg/dL (ref 0.3–1.2)
Total Protein: 7.3 g/dL (ref 6.5–8.1)

## 2019-11-07 LAB — URINALYSIS, ROUTINE W REFLEX MICROSCOPIC
Bilirubin Urine: NEGATIVE
Glucose, UA: NEGATIVE mg/dL
Ketones, ur: NEGATIVE mg/dL
Nitrite: POSITIVE — AB
Protein, ur: NEGATIVE mg/dL
Specific Gravity, Urine: 1.015 (ref 1.005–1.030)
pH: 6 (ref 5.0–8.0)

## 2019-11-07 LAB — LIPASE, BLOOD: Lipase: 32 U/L (ref 11–51)

## 2019-11-07 LAB — RESPIRATORY PANEL BY RT PCR (FLU A&B, COVID)
Influenza A by PCR: NEGATIVE
Influenza B by PCR: NEGATIVE
SARS Coronavirus 2 by RT PCR: NEGATIVE

## 2019-11-07 MED ORDER — SODIUM CHLORIDE 0.9% FLUSH
3.0000 mL | Freq: Once | INTRAVENOUS | Status: AC
  Administered 2019-11-07: 3 mL via INTRAVENOUS

## 2019-11-07 MED ORDER — CIPROFLOXACIN HCL 250 MG PO TABS
500.0000 mg | ORAL_TABLET | Freq: Once | ORAL | Status: AC
  Administered 2019-11-07: 500 mg via ORAL
  Filled 2019-11-07: qty 2

## 2019-11-07 MED ORDER — ONDANSETRON HCL 4 MG PO TABS: 4.0000 mg | ORAL_TABLET | Freq: Four times a day (QID) | ORAL | 0 refills | Status: DC | PRN

## 2019-11-07 MED ORDER — CIPROFLOXACIN HCL 500 MG PO TABS: 500.0000 mg | ORAL_TABLET | Freq: Two times a day (BID) | ORAL | 0 refills | Status: DC

## 2019-11-07 MED ORDER — LACTATED RINGERS IV BOLUS
500.0000 mL | Freq: Once | INTRAVENOUS | Status: AC
  Administered 2019-11-07: 500 mL via INTRAVENOUS

## 2019-11-07 NOTE — ED Triage Notes (Addendum)
Pain to RT rib area after vomiting x 1 today.  Pt's daughter called to report pt has been vomiting more than usual recently and also has a loss of appetite.  Pt has a history of occasional nausea and vomiting.

## 2019-11-07 NOTE — Discharge Instructions (Addendum)
You may have strained your chest wall when you were vomiting. I do not see anything concerning on your x-ray. Your COVID testing was negative. You do have a urinary tract infection though. You are being placed on antibiotics. Your symptoms might very well be from a urinary tract infection. It's certainly possible to have systemic symptoms like anorexia (lack of appetite), nausea and vomiting from this. If so, your symptoms should improve pretty quickly. You may not feel back to normal, but I expect you to feel noticeably better in the next 24-36 hours. If you do not or if you feel like you're getting significantly worse then I want you to be rechecked.

## 2019-11-08 NOTE — ED Provider Notes (Signed)
[facility] Provider Note   CSN: [id number] Arrival date & time: 11/07/19  1259     History Chief Complaint  Patient presents with  . Chest Pain    Marie [name] is a 84 y.o. female.  HPI   84 year old female with right lateral chest pain.  Onset after vomiting earlier today.  Daughter reports that over the past week or so patient has had persistent nausea and vomiting and poor appetite.  Discussed with PCP who apparently called in an appetite stimulant which she has yet to initiate.  Patient denies any abdominal pain.  Does feel sick to her stomach.  Past Medical History:  Diagnosis Date  . Balance disorder 2008.     Falls a lot.  She can be standing and then leans too far over to one side   . Bulging disc   . Chronic bronchitis ([place])    due to congestion at times, on prednisone and advair  . COPD (chronic obstructive pulmonary disease) ([place])   . COPD with acute exacerbation ([place]) 12/15/2017  . Depression    many years ago  . GERD (gastroesophageal reflux disease)   . Hearing loss    mild  . Hyperlipidemia   . Hypertension   . Iatrogenic adrenal insufficiency ([place])   . Incontinence    not indicated at this visit.  Marland Kitchen LVH (left ventricular hypertrophy) due to hypertensive disease    a. 02/2017: echo showing an EF of 65-70% with moderate LVH, hyperdynamic LV with subvalvular gradient of 70 mmHg, SAM, and mild MR  . Neuropathy    legs stay numb   . Osteoarthritis    hands,   . Osteoporosis   . Shortness of breath dyspnea   . Tubulovillous adenoma of rectum   . Wears dentures   . Wears glasses     Patient Active Problem List   Diagnosis Date Noted  . Asthmatic bronchitis 01/17/2018  . Chronic diastolic CHF (congestive heart failure) ([place]) 12/15/2017  . Anxiety 12/15/2017  . Subvalvular aortic stenosis determined by imaging 11/15/2017  . Problem with medical care compliance 11/11/2017  . Aortic atherosclerosis ([place]) 10/19/2017  . Dysphagia  09/28/2017  . Chronic respiratory failure with hypoxia, on home O2 therapy ([place]) 09/20/2017  . Hypertensive heart disease 03/29/2017  . Protein-calorie malnutrition, severe 03/17/2017  . 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 Restrictive lung disease 02/24/2017  . LVH (left ventricular hypertrophy) 01/07/2017  . Depression, major, in remission ([place]) 09/22/2016  . At high risk for falls 10/16/2015  . Prediabetes 08/23/2014  . Vitamin D deficiency 08/23/2014  . Medication management 08/23/2014  . Unstable Gait 01/30/2014  . Incontinence   . Hyperlipidemia   . GERD (gastroesophageal reflux disease)   . Hereditary and idiopathic peripheral neuropathy   . Essential hypertension   . Tubulovillous adenoma of rectum 09/04/2011    Past Surgical History:  Procedure Laterality Date  . ABDOMINAL HYSTERECTOMY    . APPENDECTOMY    . CHONDROPLASTY  09/19/2014   Procedure: CHONDROPLASTY;  Surgeon: Alta Corning, MD;  Location: [facility];  Service: Orthopedics;;  . DILATION AND CURETTAGE OF UTERUS    . EXTERNAL FIXATION LEG  10/25/2012   Procedure: EXTERNAL FIXATION LEG;  Surgeon: Rozanna Box, MD;  Location: [facility];  Service: Orthopedics;  Laterality: Right;  . EYE SURGERY     bilateral cataract surgery and lens implant  . FINGER SURGERY     fusions and debridements for OA  .  INCONTINENCE SURGERY     multiple procedures, not cured  . KNEE ARTHROSCOPY WITH LATERAL MENISECTOMY Right 09/19/2014   Procedure: KNEE ARTHROSCOPY WITH LATERAL MENISECTOMY;  Surgeon: Alta Corning, MD;  Location: [facility];  Service: Orthopedics;  Laterality: Right;  . KNEE ARTHROSCOPY WITH MEDIAL MENISECTOMY Right 09/19/2014   Procedure: RIGHT KNEE ARTHROSCOPY WITH MEDIAL AND LATERAL MENISECTOMIES. CHONDROPLASTY OF PATELLA-FEMORAL JOINT;  Surgeon: Alta Corning, MD;  Location: [facility];  Service: Orthopedics;  Laterality: Right;  . RECTAL BIOPSY  09/21/2011    Procedure: BIOPSY RECTAL;  Surgeon: Merrie Roof, MD;  Location: [facility];  Service: General;  Laterality: N/A;  3-4 cm  . RECTAL SURGERY     by dr. Marlou Starks, removal of polyp  . TONSILLECTOMY       OB History   No obstetric history on file.     Family History  Problem Relation Age of Onset  . Cancer Sister        pt unaware of what kind  . High blood pressure Mother   . COPD Mother   . Heart attack Father     Social History   Tobacco Use  . Smoking status: Never Smoker  . Smokeless tobacco: Never Used  Substance Use Topics  . Alcohol use: No  . Drug use: No    Home Medications Prior to Admission medications   Medication Sig Start Date End Date Taking? Authorizing Provider  ALPRAZolam (XANAX) 0.5 MG tablet Take 1/2-1 tablet 2 - 3 x /day ONLY if needed for Anxiety Attack &  limit to 5 days /week to avoid addiction Patient taking differently: Take 0.25-0.5 mg by mouth 3 (three) times daily as needed for sleep. ONLY if needed for Anxiety Attack &  limit to 5 days /week to avoid addiction 07/03/19  Yes [name], MD  aspirin 81 MG tablet Take 81 mg by mouth daily.    Yes [provider]  carbidopa-levodopa (SINEMET IR) 25-100 MG tablet Take 2 tablet at 9am and 2 tablets at 3pm. Patient taking differently: Take 2 tablets by mouth daily.  07/26/19  Yes Patel, Donika K, DO  cetirizine (ZYRTEC) 10 MG tablet Take 10 mg by mouth daily.   Yes [provider]  Cholecalciferol (VITAMIN D) 2000 units CAPS Take 2 capsules (4,000 Units total) by mouth daily. 08/16/18  Yes Liane Comber, NP  furosemide (LASIX) 20 MG tablet Take 1 tab (20 mg daily); if weight trending up above 148 lb with swelling or shortness of breath increase lasix for 40 mg (2 tabs) daily for 3-5 days until swelling resolves Patient taking differently: Take 20 mg by mouth daily.  03/30/19  Yes Liane Comber, NP  gabapentin (NEURONTIN) 100 MG capsule Take 1-2 capsules (100-200 mg total) by mouth 3  (three) times daily as needed. Patient taking differently: Take 100-200 mg by mouth 3 (three) times daily as needed (for itching).  10/30/19 10/29/20 Yes Liane Comber, NP  hydrOXYzine (ATARAX/VISTARIL) 25 MG tablet Take 1 tablet (25 mg total) by mouth every 6 (six) hours as needed for itching. Use sparingly; may cause sedation. 10/18/19  Yes Liane Comber, NP  meloxicam (MOBIC) 15 MG tablet Take one tablet daily as needed for pain.  Take with food.  Avoid ibuprofen but ok to take tylenol. Patient taking differently: Take 15 mg by mouth daily as needed for pain. Take with food.  Avoid ibuprofen but ok to take tylenol. 07/18/19  Yes Garnet Sierras, NP  metoprolol tartrate (LOPRESSOR) 25 MG tablet Take 1/2 tablet 2 x /day Patient taking differently: Take 12.5 mg by mouth 2 (two) times daily.  05/21/19  Yes [name], MD  omeprazole (PRILOSEC) 40 MG capsule Take 1 capsule Daily for Indigestion & Heartburn Patient taking differently: Take 40 mg by mouth every morning. for Indigestion & Heartburn 07/02/19  Yes [name], MD  OXYGEN Inhale 2 L into the lungs daily as needed (for shortness of breath). Setting is on 2 when needed    Yes [provider]  predniSONE (DELTASONE) 10 MG tablet Take 1 tablet 3 x /day or as directed Patient taking differently: Take 10 mg by mouth daily.  03/12/19  Yes [name], MD  sertraline (ZOLOFT) 50 MG tablet Take 0.5 tablets (25 mg total) by mouth daily. 03/30/19  Yes Corbett, Caryl Pina, NP  TRELEGY ELLIPTA 100-62.5-25 MCG/INH AEPB Inhale 1 puff into the lungs daily.  05/04/18  Yes [provider]  triamcinolone ointment (KENALOG) 0.1 % Apply 1 application topically 2 (two) times daily. 10/30/19  Yes Liane Comber, NP  ciprofloxacin (CIPRO) 500 MG tablet Take 1 tablet (500 mg total) by mouth 2 (two) times daily. 11/07/19   Virgel Manifold, MD  mirtazapine (REMERON) 15 MG tablet Take 1 tablet at Bedtime for Sleep & Appetite Patient not taking:  Reported on 11/07/2019 11/06/19   [name], MD  ondansetron (ZOFRAN) 4 MG tablet Take 1 tablet (4 mg total) by mouth every 6 (six) hours as needed for nausea or vomiting. 11/07/19   Virgel Manifold, MD    Allergies    Demerol  [meperidine hcl], Ertapenem, Codeine, Morphine and related, Other, and Sulfa antibiotics  Review of Systems   Review of Systems   All systems reviewed and negative, other than as noted in HPI.   Physical Exam Updated Vital Signs BP (!) 160/68   Pulse 83   Temp 98.2 F (36.8 C) (Oral)   Resp 18   Ht 5\' 4"  (1.626 m)   Wt 63 kg   SpO2 97%   BMI 23.84 kg/m   Physical Exam Vitals and nursing note reviewed.  Constitutional:      General: She is not in acute distress.    Appearance: She is well-developed.  HENT:     Head: Normocephalic and atraumatic.  Eyes:     General:        Right eye: No discharge.        Left eye: No discharge.     Conjunctiva/sclera: Conjunctivae normal.  Cardiovascular:     Rate and Rhythm: Normal rate and regular rhythm.     Heart sounds: Normal heart sounds. No murmur. No friction rub. No gallop.   Pulmonary:     Effort: Pulmonary effort is normal. No respiratory distress.     Breath sounds: Normal breath sounds.  Abdominal:     General: There is no distension.     Palpations: Abdomen is soft.     Tenderness: There is abdominal tenderness.     Comments: Tenderness to palpation over the right lateral chest wall.  No overlying skin changes.  No crepitus.  Musculoskeletal:        General: No tenderness.     Cervical back: Neck supple.  Skin:    General: Skin is warm and dry.  Neurological:     Mental Status: She is alert.  Psychiatric:        Behavior: Behavior normal.        Thought Content: Thought  content normal.     ED Results / Procedures / Treatments   Labs (all labs ordered are listed, but only abnormal results are displayed) Labs Reviewed  COMPREHENSIVE METABOLIC PANEL - Abnormal; Notable for the  following components:      Result Value   Glucose, Bld 134 (*)    Calcium 8.8 (*)    All other components within normal limits  CBC - Abnormal; Notable for the following components:   RBC 5.51 (*)    HCT 46.4 (*)    MCH 24.5 (*)    MCHC 29.1 (*)    RDW 15.8 (*)    All other components within normal limits  URINALYSIS, ROUTINE W REFLEX MICROSCOPIC - Abnormal; Notable for the following components:   APPearance CLOUDY (*)    Hgb urine dipstick MODERATE (*)    Nitrite POSITIVE (*)    Leukocytes,Ua MODERATE (*)    Bacteria, UA FEW (*)    All other components within normal limits  RESPIRATORY PANEL BY RT PCR (FLU A&B, COVID)  URINE CULTURE  LIPASE, BLOOD    EKG None  Radiology DG Chest 2 View  Result Date: 11/07/2019 CLINICAL DATA:  Right-sided chest pain EXAM: CHEST - 2 VIEW COMPARISON:  03/20/2019 FINDINGS: The heart size and mediastinal contours are within normal limits. Atherosclerotic calcifications of the aorta. Mild chronic bronchitic lung changes. No focal airspace consolidation, pleural effusion, or pneumothorax. The visualized skeletal structures are unremarkable. IMPRESSION: No active cardiopulmonary disease. Electronically Signed   By: Davina Poke D.O.   On: 11/07/2019 14:24    Procedures Procedures (including critical care time)  Medications Ordered in ED Medications  sodium chloride flush (NS) 0.9 % injection 3 mL (3 mLs Intravenous Given 11/07/19 1834)  lactated ringers bolus 500 mL (0 mLs Intravenous Stopped 11/07/19 1940)  ciprofloxacin (CIPRO) tablet 500 mg (500 mg Oral Given 11/07/19 2155)    ED Course  I have reviewed the triage vital signs and the nursing notes.  Pertinent labs & imaging results that were available during my care of the patient were reviewed by me and considered in my medical decision making (see chart for details).    MDM Rules/Calculators/A&P                      84 year old female with right lateral chest pain.  I suspect chest  wall strain from vomiting.  Imaging okay.  She is declining pain medicine.  No respiratory distress.  Doubt ACS, PE, dissection or other emergent process.  Her anorexia and nausea/vomiting may have some to do with urinary tract infection.  Abdominal exam is pretty benign.  Will place on antibiotics.  Hopefully this will improve some of her other systemic symptoms.  Needs to follow-up closely with her PCP.  Final Clinical Impression(s) / ED Diagnoses Final diagnoses:  Chest pain, unspecified type  Urinary tract infection without hematuria, site unspecified    Rx / DC Orders ED Discharge Orders         Ordered    ciprofloxacin (CIPRO) 500 MG tablet  2 times daily     11/07/19 2158    ondansetron (ZOFRAN) 4 MG tablet  Every 6 hours PRN     11/07/19 2158           Virgel Manifold, MD 11/08/19 1830

## 2019-11-10 LAB — URINE CULTURE: Culture: >=100000 — AB

## 2019-11-11 ENCOUNTER — Telehealth: Payer: Self-pay | Admitting: Emergency Medicine

## 2019-11-11 NOTE — Telephone Encounter (Signed)
Post ED Visit - Positive Culture Follow-up  Culture report reviewed by antimicrobial stewardship pharmacist: [facility] Team []  Elenor Quinones, Pharm.D. []  Heide Guile, Pharm.D., BCPS AQ-ID []  Parks Neptune, Pharm.D., BCPS []  Alycia Rossetti, Pharm.D., BCPS []  Nitro, Pharm.D., BCPS, AAHIVP []  Legrand Como, Pharm.D., BCPS, AAHIVP []  Salome Arnt, PharmD, BCPS []  Johnnette Gourd, PharmD, BCPS [x]  Hughes Better, PharmD, BCPS []  Leeroy Cha, PharmD []  Laqueta Linden, PharmD, BCPS []  Albertina Parr, PharmD  [facility] Team []  Leodis Sias, PharmD []  Lindell Spar, PharmD []  Royetta Asal, PharmD []  Graylin Shiver, Rph []  Rema Fendt) Glennon Mac, PharmD []  Arlyn Dunning, PharmD []  Netta Cedars, PharmD []  Dia Sitter, PharmD []  Leone Haven, PharmD []  Gretta Arab, PharmD []  Theodis Shove, PharmD []  Peggyann Juba, PharmD []  Reuel Boom, PharmD   Positive urine culture Treated with ciprofloxacin, organism sensitive to the same and no further patient follow-up is required at this time.  Hazle Nordmann 11/11/2019, 2:45 PM

## 2019-11-12 ENCOUNTER — Emergency Department (HOSPITAL_COMMUNITY): Payer: Medicare Other

## 2019-11-12 ENCOUNTER — Encounter (HOSPITAL_COMMUNITY): Payer: Self-pay | Admitting: Emergency Medicine

## 2019-11-12 ENCOUNTER — Other Ambulatory Visit: Payer: Self-pay

## 2019-11-12 ENCOUNTER — Emergency Department (HOSPITAL_COMMUNITY)
Admission: EM | Admit: 2019-11-12 | Discharge: 2019-11-12 | Disposition: A | Discharge status: Home / Self Care | Payer: Medicare Other | Attending: Emergency Medicine | Admitting: Emergency Medicine

## 2019-11-12 DIAGNOSIS — R2241 Localized swelling, mass and lump, right lower limb: Secondary | ICD-10-CM | POA: Insufficient documentation

## 2019-11-12 DIAGNOSIS — Y998 Other external cause status: Secondary | ICD-10-CM | POA: Insufficient documentation

## 2019-11-12 DIAGNOSIS — Z79899 Other long term (current) drug therapy: Secondary | ICD-10-CM | POA: Insufficient documentation

## 2019-11-12 DIAGNOSIS — S0990XA Unspecified injury of head, initial encounter: Secondary | ICD-10-CM | POA: Diagnosis not present

## 2019-11-12 DIAGNOSIS — W06XXXA Fall from bed, initial encounter: Secondary | ICD-10-CM | POA: Diagnosis not present

## 2019-11-12 DIAGNOSIS — Z7982 Long term (current) use of aspirin: Secondary | ICD-10-CM | POA: Insufficient documentation

## 2019-11-12 DIAGNOSIS — G20 Parkinson's disease: Secondary | ICD-10-CM | POA: Diagnosis not present

## 2019-11-12 DIAGNOSIS — Y9389 Activity, other specified: Secondary | ICD-10-CM | POA: Diagnosis not present

## 2019-11-12 DIAGNOSIS — Y92013 Bedroom of single-family (private) house as the place of occurrence of the external cause: Secondary | ICD-10-CM | POA: Insufficient documentation

## 2019-11-12 DIAGNOSIS — S8001XA Contusion of right knee, initial encounter: Secondary | ICD-10-CM | POA: Diagnosis not present

## 2019-11-12 DIAGNOSIS — R001 Bradycardia, unspecified: Secondary | ICD-10-CM | POA: Diagnosis not present

## 2019-11-12 DIAGNOSIS — S8011XA Contusion of right lower leg, initial encounter: Secondary | ICD-10-CM | POA: Diagnosis not present

## 2019-11-12 DIAGNOSIS — I5032 Chronic diastolic (congestive) heart failure: Secondary | ICD-10-CM | POA: Diagnosis not present

## 2019-11-12 DIAGNOSIS — J449 Chronic obstructive pulmonary disease, unspecified: Secondary | ICD-10-CM | POA: Diagnosis not present

## 2019-11-12 DIAGNOSIS — Z743 Need for continuous supervision: Secondary | ICD-10-CM | POA: Diagnosis not present

## 2019-11-12 DIAGNOSIS — R110 Nausea: Secondary | ICD-10-CM | POA: Diagnosis not present

## 2019-11-12 DIAGNOSIS — S8991XA Unspecified injury of right lower leg, initial encounter: Secondary | ICD-10-CM | POA: Diagnosis present

## 2019-11-12 DIAGNOSIS — I110 Hypertensive heart disease with heart failure: Secondary | ICD-10-CM | POA: Insufficient documentation

## 2019-11-12 DIAGNOSIS — R609 Edema, unspecified: Secondary | ICD-10-CM | POA: Diagnosis not present

## 2019-11-12 DIAGNOSIS — S82891A Other fracture of right lower leg, initial encounter for closed fracture: Secondary | ICD-10-CM | POA: Diagnosis not present

## 2019-11-12 DIAGNOSIS — R52 Pain, unspecified: Secondary | ICD-10-CM | POA: Diagnosis not present

## 2019-11-12 LAB — CBC WITH DIFFERENTIAL/PLATELET
Abs Immature Granulocytes: 0.03 10*3/uL (ref 0.00–0.07)
Basophils Absolute: 0.1 10*3/uL (ref 0.0–0.1)
Basophils Relative: 1 %
Eosinophils Absolute: 1.3 10*3/uL — ABNORMAL HIGH (ref 0.0–0.5)
Eosinophils Relative: 11 %
HCT: 40.8 % (ref 36.0–46.0)
Hemoglobin: 11.6 g/dL — ABNORMAL LOW (ref 12.0–15.0)
Immature Granulocytes: 0 %
Lymphocytes Relative: 34 %
Lymphs Abs: 4.1 10*3/uL — ABNORMAL HIGH (ref 0.7–4.0)
MCH: 24.8 pg — ABNORMAL LOW (ref 26.0–34.0)
MCHC: 28.4 g/dL — ABNORMAL LOW (ref 30.0–36.0)
MCV: 87.4 fL (ref 80.0–100.0)
Monocytes Absolute: 0.9 10*3/uL (ref 0.1–1.0)
Monocytes Relative: 8 %
Neutro Abs: 5.5 10*3/uL (ref 1.7–7.7)
Neutrophils Relative %: 46 %
Platelets: 305 10*3/uL (ref 150–400)
RBC: 4.67 MIL/uL (ref 3.87–5.11)
RDW: 15.6 % — ABNORMAL HIGH (ref 11.5–15.5)
WBC: 11.9 10*3/uL — ABNORMAL HIGH (ref 4.0–10.5)
nRBC: 0 % (ref 0.0–0.2)

## 2019-11-12 LAB — BASIC METABOLIC PANEL
Anion gap: 8 (ref 5–15)
BUN: 21 mg/dL (ref 8–23)
CO2: 28 mmol/L (ref 22–32)
Calcium: 8.7 mg/dL — ABNORMAL LOW (ref 8.9–10.3)
Chloride: 108 mmol/L (ref 98–111)
Creatinine, Ser: 0.91 mg/dL (ref 0.44–1.00)
GFR calc Af Amer: >60 mL/min (ref >60)
GFR calc non Af Amer: 59 mL/min — ABNORMAL LOW (ref >60)
Glucose, Bld: 121 mg/dL — ABNORMAL HIGH (ref 70–99)
Potassium: 3.9 mmol/L (ref 3.5–5.1)
Sodium: 144 mmol/L (ref 135–145)

## 2019-11-12 LAB — PROTIME-INR
INR: 1.2 (ref 0.8–1.2)
Prothrombin Time: 14.8 seconds (ref 11.4–15.2)

## 2019-11-12 MED ORDER — ONDANSETRON HCL 4 MG/2ML IJ SOLN
4.0000 mg | Freq: Once | INTRAMUSCULAR | Status: AC
  Administered 2019-11-12: 4 mg via INTRAVENOUS
  Filled 2019-11-12: qty 2

## 2019-11-12 MED ORDER — HYDROCODONE-ACETAMINOPHEN 5-325 MG PO TABS: 1.0000 | ORAL_TABLET | Freq: Four times a day (QID) | ORAL | 0 refills | Status: DC | PRN

## 2019-11-12 MED ORDER — ONDANSETRON 8 MG PO TBDP: 8.0000 mg | ORAL_TABLET | Freq: Three times a day (TID) | ORAL | 0 refills | Status: AC | PRN

## 2019-11-12 MED ORDER — MORPHINE SULFATE (PF) 4 MG/ML IV SOLN
4.0000 mg | Freq: Once | INTRAVENOUS | Status: AC
  Administered 2019-11-12: 4 mg via INTRAVENOUS
  Filled 2019-11-12: qty 1

## 2019-11-12 NOTE — ED Notes (Signed)
Peripheral pedal pulses are intact bilaterally.

## 2019-11-12 NOTE — ED Notes (Signed)
Discharge orders covered with pt's daughter and she verbalized understanding.

## 2019-11-12 NOTE — Discharge Instructions (Signed)
Apply ice to help with the swelling and bruising, try to keep the leg elevated, the knee immobilizer is for comfort and support and can removed as needed.  Follow up with your doctor or an orthopedic doctor if not improving

## 2019-11-12 NOTE — ED Triage Notes (Signed)
Per EMS patient fell at home when transferring from bed. Patient has deformity to right knee with swelling and bruising. Laceration to posterior head. No loss of consciousness. Patient received 4 mg of morphine and 4 mg of Zofran IV.  Patient was seen 1/19 for UTI treatment and had a negative COVID test in the ED.

## 2019-11-12 NOTE — ED Provider Notes (Signed)
[facility] EMERGENCY DEPARTMENT Provider Note   CSN: [id number] Arrival date & time: 11/12/19  1034     History Chief Complaint  Patient presents with  . Fall    Marie [name] is a 84 y.o. female.  HPI   Patient presents to the emergency room for evaluation of leg pain and swelling after a fall.  Patient has a history of Parkinson's disease causing balance issues.  Patient states she was trying to get out of bed when she fell.  She struck her head and her leg.  Denies significant loss of consciousness patient is now having significant pain and swelling of her lower leg.  Patient denies any other complaints of chest pain or shortness of breath.  No severe headache.  No fevers or chills.  No vomiting or diarrhea  Past Medical History:  Diagnosis Date  . Balance disorder 2008.     Falls a lot.  She can be standing and then leans too far over to one side   . Bulging disc   . Chronic bronchitis ([place])    due to congestion at times, on prednisone and advair  . COPD (chronic obstructive pulmonary disease) ([place])   . COPD with acute exacerbation ([place]) 12/15/2017  . Depression    many years ago  . GERD (gastroesophageal reflux disease)   . Hearing loss    mild  . Hyperlipidemia   . Hypertension   . Iatrogenic adrenal insufficiency ([place])   . Incontinence    not indicated at this visit.  Marland Kitchen LVH (left ventricular hypertrophy) due to hypertensive disease    a. 02/2017: echo showing an EF of 65-70% with moderate LVH, hyperdynamic LV with subvalvular gradient of 70 mmHg, SAM, and mild MR  . Neuropathy    legs stay numb   . Osteoarthritis    hands,   . Osteoporosis   . Shortness of breath dyspnea   . Tubulovillous adenoma of rectum   . Wears dentures   . Wears glasses     Patient Active Problem List   Diagnosis Date Noted  . Asthmatic bronchitis 01/17/2018  . Chronic diastolic CHF (congestive heart failure) ([place]) 12/15/2017  . Anxiety 12/15/2017  . Subvalvular aortic stenosis  determined by imaging 11/15/2017  . Problem with medical care compliance 11/11/2017  . Aortic atherosclerosis ([place]) 10/19/2017  . Dysphagia 09/28/2017  . Chronic respiratory failure with hypoxia, on home O2 therapy ([place]) 09/20/2017  . Hypertensive heart disease 03/29/2017  . Protein-calorie malnutrition, severe 03/17/2017  . 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 Restrictive lung disease 02/24/2017  . LVH (left ventricular hypertrophy) 01/07/2017  . Depression, major, in remission ([place]) 09/22/2016  . At high risk for falls 10/16/2015  . Prediabetes 08/23/2014  . Vitamin D deficiency 08/23/2014  . Medication management 08/23/2014  . Unstable Gait 01/30/2014  . Incontinence   . Hyperlipidemia   . GERD (gastroesophageal reflux disease)   . Hereditary and idiopathic peripheral neuropathy   . Essential hypertension   . Tubulovillous adenoma of rectum 09/04/2011    Past Surgical History:  Procedure Laterality Date  . ABDOMINAL HYSTERECTOMY    . APPENDECTOMY    . CHONDROPLASTY  09/19/2014   Procedure: CHONDROPLASTY;  Surgeon: Alta Corning, MD;  Location: [place];  Service: Orthopedics;;  . DILATION AND CURETTAGE OF UTERUS    . EXTERNAL FIXATION LEG  10/25/2012   Procedure: EXTERNAL FIXATION LEG;  Surgeon: Rozanna Box, MD;  Location: [place];  Service: Orthopedics;  Laterality: Right;  . EYE SURGERY     bilateral cataract surgery and lens implant  . FINGER SURGERY     fusions and debridements for OA  . INCONTINENCE SURGERY     multiple procedures, not cured  . KNEE ARTHROSCOPY WITH LATERAL MENISECTOMY Right 09/19/2014   Procedure: KNEE ARTHROSCOPY WITH LATERAL MENISECTOMY;  Surgeon: Alta Corning, MD;  Location: [place];  Service: Orthopedics;  Laterality: Right;  . KNEE ARTHROSCOPY WITH MEDIAL MENISECTOMY Right 09/19/2014   Procedure: RIGHT KNEE ARTHROSCOPY WITH MEDIAL AND LATERAL MENISECTOMIES. CHONDROPLASTY OF PATELLA-FEMORAL JOINT;   Surgeon: Alta Corning, MD;  Location: [place];  Service: Orthopedics;  Laterality: Right;  . RECTAL BIOPSY  09/21/2011   Procedure: BIOPSY RECTAL;  Surgeon: Merrie Roof, MD;  Location: [place];  Service: General;  Laterality: N/A;  3-4 cm  . RECTAL SURGERY     by dr. Marlou Starks, removal of polyp  . TONSILLECTOMY       OB History   No obstetric history on file.     Family History  Problem Relation Age of Onset  . Cancer Sister        pt unaware of what kind  . High blood pressure Mother   . COPD Mother   . Heart attack Father     Social History   Tobacco Use  . Smoking status: Never Smoker  . Smokeless tobacco: Never Used  Substance Use Topics  . Alcohol use: No  . Drug use: No    Home Medications Prior to Admission medications   Medication Sig Start Date End Date Taking? Authorizing Provider  ALPRAZolam (XANAX) 0.5 MG tablet Take 1/2-1 tablet 2 - 3 x /day ONLY if needed for Anxiety Attack &  limit to 5 days /week to avoid addiction Patient taking differently: Take 0.25-0.5 mg by mouth 3 (three) times daily as needed for sleep. ONLY if needed for Anxiety Attack &  limit to 5 days /week to avoid addiction 07/03/19  Yes [name], MD  aspirin 81 MG tablet Take 81 mg by mouth daily.    Yes [provider]  carbidopa-levodopa (SINEMET IR) 25-100 MG tablet Take 2 tablet at 9am and 2 tablets at 3pm. Patient taking differently: Take 2 tablets by mouth daily.  07/26/19  Yes Patel, Donika K, DO  cetirizine (ZYRTEC) 10 MG tablet Take 10 mg by mouth daily.   Yes [provider]  Cholecalciferol (VITAMIN D) 2000 units CAPS Take 2 capsules (4,000 Units total) by mouth daily. 08/16/18  Yes Liane Comber, NP  ciprofloxacin (CIPRO) 500 MG tablet Take 1 tablet (500 mg total) by mouth 2 (two) times daily. 11/07/19  Yes Virgel Manifold, MD  furosemide (LASIX) 20 MG tablet Take 1 tab (20 mg daily); if weight trending up above 148 lb with swelling or  shortness of breath increase lasix for 40 mg (2 tabs) daily for 3-5 days until swelling resolves Patient taking differently: Take 20 mg by mouth daily.  03/30/19  Yes Liane Comber, NP  gabapentin (NEURONTIN) 100 MG capsule Take 1-2 capsules (100-200 mg total) by mouth 3 (three) times daily as needed. Patient taking differently: Take 100-200 mg by mouth 3 (three) times daily as needed (for itching).  10/30/19 10/29/20 Yes Liane Comber, NP  hydrOXYzine (ATARAX/VISTARIL) 25 MG tablet Take 1 tablet (25 mg total) by mouth every 6 (six) hours as needed for itching. Use sparingly; may cause sedation. 10/18/19  Yes Liane Comber, NP  meloxicam (MOBIC) 15 MG tablet Take one tablet daily as needed for pain.  Take with food.  Avoid ibuprofen but ok to take tylenol. Patient taking differently: Take 15 mg by mouth daily as needed for pain. Take with food.  Avoid ibuprofen but ok to take tylenol. 07/18/19  Yes McClanahan, Danton Sewer, NP  metoprolol tartrate (LOPRESSOR) 25 MG tablet Take 1/2 tablet 2 x /day Patient taking differently: Take 12.5 mg by mouth 2 (two) times daily.  05/21/19  Yes [name], MD  mirtazapine (REMERON) 15 MG tablet Take 1 tablet at Bedtime for Sleep & Appetite 11/06/19  Yes [name], MD  omeprazole (PRILOSEC) 40 MG capsule Take 1 capsule Daily for Indigestion & Heartburn Patient taking differently: Take 40 mg by mouth every morning. for Indigestion & Heartburn 07/02/19  Yes [name], MD  ondansetron (ZOFRAN) 4 MG tablet Take 1 tablet (4 mg total) by mouth every 6 (six) hours as needed for nausea or vomiting. 11/07/19  Yes Virgel Manifold, MD  OXYGEN Inhale 2 L into the lungs daily as needed (for shortness of breath). Setting is on 2 when needed    Yes [provider]  predniSONE (DELTASONE) 10 MG tablet Take 1 tablet 3 x /day or as directed Patient taking differently: Take 10 mg by mouth daily.  03/12/19  Yes [name], MD  sertraline (ZOLOFT) 50 MG tablet  Take 0.5 tablets (25 mg total) by mouth daily. 03/30/19  Yes Corbett, Caryl Pina, NP  TRELEGY ELLIPTA 100-62.5-25 MCG/INH AEPB Inhale 1 puff into the lungs daily.  05/04/18  Yes [provider]  triamcinolone ointment (KENALOG) 0.1 % Apply 1 application topically 2 (two) times daily. 10/30/19  Yes Liane Comber, NP  HYDROcodone-acetaminophen (NORCO/VICODIN) 5-325 MG tablet Take 1 tablet by mouth every 6 (six) hours as needed. 11/12/19   Dorie Rank, MD    Allergies    Demerol  [meperidine hcl], Ertapenem, Codeine, Morphine and related, Other, and Sulfa antibiotics  Review of Systems   Review of Systems  All other systems reviewed and are negative.   Physical Exam Updated Vital Signs BP (!) 118/59   Pulse 74   Temp 98.8 F (37.1 C) (Oral)   Resp 14   Wt 63 kg   SpO2 100%   BMI 23.84 kg/m   Physical Exam Vitals and nursing note reviewed.  Constitutional:      Appearance: She is well-developed.     Comments: Elderly, frail  HENT:     Head: Normocephalic.     Comments: Hematoma posterior occiput, small skin abrasion    Right Ear: External ear normal.     Left Ear: External ear normal.  Eyes:     General: No scleral icterus.       Right eye: No discharge.        Left eye: No discharge.     Conjunctiva/sclera: Conjunctivae normal.  Neck:     Trachea: No tracheal deviation.  Cardiovascular:     Rate and Rhythm: Normal rate and regular rhythm.  Pulmonary:     Effort: Pulmonary effort is normal. No respiratory distress.     Breath sounds: Normal breath sounds. No stridor. No wheezing or rales.  Abdominal:     General: Bowel sounds are normal. There is no distension.     Palpations: Abdomen is soft.     Tenderness: There is no abdominal tenderness. There is no guarding or rebound.  Musculoskeletal:     Right shoulder: No swelling, tenderness or bony tenderness.  Left shoulder: No swelling, tenderness or bony tenderness.     Right wrist: No swelling, tenderness or  bony tenderness.     Left wrist: No swelling, tenderness or bony tenderness.     Cervical back: Neck supple. No swelling, tenderness or bony tenderness.     Thoracic back: No swelling, tenderness or bony tenderness.     Lumbar back: No swelling, tenderness or bony tenderness.     Right hip: No tenderness or bony tenderness. Normal range of motion.     Left hip: No tenderness or bony tenderness. Normal range of motion.     Right knee: Swelling, deformity, effusion and bony tenderness present. Tenderness present.     Right lower leg: Swelling present. Edema present.     Right ankle: No swelling. No tenderness.     Left ankle: No swelling. No tenderness.     Comments: Large amount of bruising around the right knee and right lower leg  Skin:    General: Skin is warm and dry.     Findings: No rash.  Neurological:     Mental Status: She is alert.     Cranial Nerves: Dysarthria present. No cranial nerve deficit (no facial droop, extraocular movements intact, no slurred speech).     Sensory: No sensory deficit.     Motor: No abnormal muscle tone or seizure activity.     Coordination: Coordination abnormal.     ED Results / Procedures / Treatments   Labs (all labs ordered are listed, but only abnormal results are displayed) Labs Reviewed  CBC WITH DIFFERENTIAL/PLATELET - Abnormal; Notable for the following components:      Result Value   WBC 11.9 (*)    Hemoglobin 11.6 (*)    MCH 24.8 (*)    MCHC 28.4 (*)    RDW 15.6 (*)    Lymphs Abs 4.1 (*)    Eosinophils Absolute 1.3 (*)    All other components within normal limits  BASIC METABOLIC PANEL - Abnormal; Notable for the following components:   Glucose, Bld 121 (*)    Calcium 8.7 (*)    GFR calc non Af Amer 59 (*)    All other components within normal limits  PROTIME-INR    EKG None  Radiology DG Tibia/Fibula Right  Result Date: 11/12/2019 CLINICAL DATA:  [place] this morning, bruising and swelling to RIGHT lower leg and RIGHT  knee, fell transferring from bed EXAM: RIGHT TIBIA AND FIBULA - 2 VIEW COMPARISON:  None FINDINGS: Significant soft tissue swelling at knee and proximal lower leg. Diffuse osseous demineralization. Joint space narrowing and spur formation at medial compartment RIGHT knee. Cortical lucency identified at mid diaphysis of RIGHT tibia, question related to prior external fixator patient had in 2014. Cannulated screw medial malleolus post ORIF. Syndesmotic screw lateral to medial at RIGHT ankle with fracture of the midportion of the screw; mild lucency surrounding the proximal fragment of the screw suggest this is old. Degenerative changes of RIGHT ankle joint with joint space narrowing and irregularity. Old healed distal fibular fracture. No definite acute fracture, dislocation, or bone destruction. IMPRESSION: Prior ORIF RIGHT ankle with fracture of the syndesmotic screw, suspect old. Osseous demineralization with degenerative changes RIGHT knee and RIGHT ankle. Old healed distal tibial and fibular fractures. No definite acute fracture or dislocation. Extensive soft tissue swelling greatest at the knee/proximal lower leg. Electronically Signed   By: Lavonia Dana M.D.   On: 11/12/2019 12:12   CT Head Wo Contrast  Result Date:  11/12/2019 CLINICAL DATA:  Status post fall, hit the back of the head. EXAM: CT HEAD WITHOUT CONTRAST TECHNIQUE: Contiguous axial images were obtained from the base of the skull through the vertex without intravenous contrast. COMPARISON:  None FINDINGS: Brain: No evidence of acute infarction, hemorrhage, extra-axial collection, ventriculomegaly, or mass effect. Generalized cerebral atrophy. Periventricular white matter low attenuation likely secondary to microangiopathy. Vascular: Cerebrovascular atherosclerotic calcifications are noted. Skull: Negative for acute fracture or focal lesion. Osteoarthritis of bilateral temporomandibular joints. Old fracture of the right mandibular condyle.  Sinuses/Orbits: Visualized portions of the orbits are unremarkable. Visualized portions of the paranasal sinuses are unremarkable. Visualized portions of the mastoid air cells are unremarkable. Other: None. IMPRESSION: 1. No acute intracranial pathology. 2. Chronic microvascular disease and cerebral atrophy. Electronically Signed   By: Kathreen Devoid   On: 11/12/2019 11:58   DG Knee Complete 4 Views Right  Result Date: 11/12/2019 CLINICAL DATA:  Pain, swelling and bruising to RIGHT lower leg and RIGHT knee post fall EXAM: RIGHT KNEE - COMPLETE 4+ VIEW COMPARISON:  05/20/2015 FINDINGS: Osseous demineralization. Medial compartment joint space narrowing and spur formation. Additional spurring at patellofemoral joint. Curvilinear bone density at superior aspect of medial femoral condyle consistent with Pellegrini-Stieda lesion and likely prior MCL injury. Slight bony protuberance at the medial tibial plateau on oblique view without cortical defect, not felt to represent an acute fracture. No definite acute fracture, dislocation or bone destruction. No definite joint effusion. Marked soft tissue swelling RIGHT knee greatest anteriorly. Scattered atherosclerotic calcifications. IMPRESSION: Degenerative changes RIGHT knee without acute fracture or dislocation. Suspected remote MCL injury. Extensive soft tissue swelling especially anteriorly. Electronically Signed   By: Lavonia Dana M.D.   On: 11/12/2019 12:05    Procedures Procedures (including critical care time)  Medications Ordered in ED Medications  morphine 4 MG/ML injection 4 mg (4 mg Intravenous Given 11/12/19 1122)    ED Course  I have reviewed the triage vital signs and the nursing notes.  Pertinent labs & imaging results that were available during my care of the patient were reviewed by me and considered in my medical decision making (see chart for details).  Clinical Course as of Nov 12 1303  Sun Nov 12, 2019  1223 Labs reviewed.  No  significant abnormalities.  X-rays reviewed.  No acute fracture.   [JK]    Clinical Course User Index [JK] Dorie Rank, MD   MDM Rules/Calculators/A&P                      Patient presented to the ED after a fall at home.  Additional information was provided by the daughter.  Patient has very limited mobility because of her Parkinson's.  She generally requires assistance with all transfers.  Patient normally gets help going to the bathroom but this morning she tried to do it without herself and she fell.  She has significant bruising of her leg but fortunately there does not appear to be any fracture.  Patient was given medications for pain with some improvement.  Discussed the findings with the daughter.  She feels they have adequate support at home.  Patient normally just stands to transfer.  They use her walker wheelchair to move her around the home.  They have home health assistance as well.  I will write a prescription for hydrocodone.  We did discuss the possibility of side effects including hallucinations and she may want to try just taking Tylenol.   Final Clinical  Impression(s) / ED Diagnoses Final diagnoses:  Contusion of multiple sites of right lower extremity, initial encounter    Rx / DC Orders ED Discharge Orders         Ordered    HYDROcodone-acetaminophen (NORCO/VICODIN) 5-325 MG tablet  Every 6 hours PRN     11/12/19 1302           Dorie Rank, MD 11/12/19 1305

## 2019-11-15 ENCOUNTER — Other Ambulatory Visit: Payer: Self-pay

## 2019-11-15 ENCOUNTER — Ambulatory Visit (INDEPENDENT_AMBULATORY_CARE_PROVIDER_SITE_OTHER): Payer: Medicare Other | Admitting: Orthopedic Surgery

## 2019-11-15 VITALS — BP 122/76 | HR 74 | Temp 96.4°F | Ht 64.0 in

## 2019-11-15 DIAGNOSIS — S8011XA Contusion of right lower leg, initial encounter: Secondary | ICD-10-CM

## 2019-11-15 MED ORDER — TRAMADOL HCL 50 MG PO TABS: 50.0000 mg | ORAL_TABLET | Freq: Four times a day (QID) | ORAL | 5 refills | Status: DC | PRN

## 2019-11-15 NOTE — Patient Instructions (Signed)
Continue ice for up to a week after injury  Patient is weightbearing as tolerated  May use Tylenol along with tramadol as well as meloxicam.

## 2019-11-15 NOTE — Progress Notes (Signed)
EMERGENCY ROOM FOLLOW UP  NEW PROBLEM/PATIENT   Patient ID: Marie [name], female   DOB: January 09, 1936, 84 y.o.   MRN: [id number]  Emergency room record from (date) 24 jan 21 has been reviewed and this is included by reference and includes the review of systems with the following addition:   Chief Complaint  Patient presents with  . Knee Pain    ER follow up on right knee, DOI 11-12-19.    HPI Marie [name] is a 84 y.o. female.  [place] out of her bed or getting out of her bed that was a high bed and had a platform for her to get on to get out of the bed  84 year old female who tends to bruise and bleed easily fell out of her bed getting off the platform injured her right knee had x-rays only showed soft tissue swelling presents with a very swollen leg lots of ecchymosis from the foot all the way up to the medial thigh complaining of medial knee pain with x-rays that show possible old medial collateral ligament injury versus possible acute injury.  She does complain of pain which is relieved with hydrocodone but it knocks her out  She is ambulatory slightly since the injury.   Review of Systems Review of Systems  Allergic/Immunologic:       Easy bruise bleed  Neurological: Positive for weakness.     has a past medical history of Balance disorder (2008.  ), Bulging disc, Chronic bronchitis ([place]), COPD (chronic obstructive pulmonary disease) ([place]), COPD with acute exacerbation ([place]) (12/15/2017), Depression, GERD (gastroesophageal reflux disease), Hearing loss, Hyperlipidemia, Hypertension, Iatrogenic adrenal insufficiency ([place]), Incontinence, LVH (left ventricular hypertrophy) due to hypertensive disease, Neuropathy, Osteoarthritis, Osteoporosis, Shortness of breath dyspnea, Tubulovillous adenoma of rectum, Wears dentures, and Wears glasses.   Past Surgical History:  Procedure Laterality Date  . ABDOMINAL HYSTERECTOMY    . APPENDECTOMY    . CHONDROPLASTY  09/19/2014   Procedure:  CHONDROPLASTY;  Surgeon: Alta Corning, MD;  Location: [facility];  Service: Orthopedics;;  . DILATION AND CURETTAGE OF UTERUS    . EXTERNAL FIXATION LEG  10/25/2012   Procedure: EXTERNAL FIXATION LEG;  Surgeon: Rozanna Box, MD;  Location: [facility];  Service: Orthopedics;  Laterality: Right;  . EYE SURGERY     bilateral cataract surgery and lens implant  . FINGER SURGERY     fusions and debridements for OA  . INCONTINENCE SURGERY     multiple procedures, not cured  . KNEE ARTHROSCOPY WITH LATERAL MENISECTOMY Right 09/19/2014   Procedure: KNEE ARTHROSCOPY WITH LATERAL MENISECTOMY;  Surgeon: Alta Corning, MD;  Location: [facility];  Service: Orthopedics;  Laterality: Right;  . KNEE ARTHROSCOPY WITH MEDIAL MENISECTOMY Right 09/19/2014   Procedure: RIGHT KNEE ARTHROSCOPY WITH MEDIAL AND LATERAL MENISECTOMIES. CHONDROPLASTY OF PATELLA-FEMORAL JOINT;  Surgeon: Alta Corning, MD;  Location: [facility];  Service: Orthopedics;  Laterality: Right;  . RECTAL BIOPSY  09/21/2011   Procedure: BIOPSY RECTAL;  Surgeon: Merrie Roof, MD;  Location: [place];  Service: General;  Laterality: N/A;  3-4 cm  . RECTAL SURGERY     by dr. Marlou Starks, removal of polyp  . TONSILLECTOMY      Family History  Problem Relation Age of Onset  . Cancer Sister        pt unaware of what kind  . High blood pressure Mother   . COPD Mother   . Heart attack Father  Social History Social History   Tobacco Use  . Smoking status: Never Smoker  . Smokeless tobacco: Never Used  Substance Use Topics  . Alcohol use: No  . Drug use: No    Allergies  Allergen Reactions  . Demerol  [Meperidine Hcl] Other (See Comments)    Pt. Feels like she is suffocating  . Ertapenem Hives    Caused whole body to turn red Other reaction(s): Redness Caused whole body to turn red  . Codeine Other (See Comments)    hallucinations  . Morphine And Related Other (See Comments)    hallucinations   . Other Other (See Comments)    Invan 7- pt became red all over  . Sulfa Antibiotics Rash    Current Outpatient Medications  Medication Sig Dispense Refill  . ALPRAZolam (XANAX) 0.5 MG tablet Take 1/2-1 tablet 2 - 3 x /day ONLY if needed for Anxiety Attack &  limit to 5 days /week to avoid addiction (Patient taking differently: Take 0.25-0.5 mg by mouth 3 (three) times daily as needed for sleep. ONLY if needed for Anxiety Attack &  limit to 5 days /week to avoid addiction) 90 tablet 0  . aspirin 81 MG tablet Take 81 mg by mouth daily.     . carbidopa-levodopa (SINEMET IR) 25-100 MG tablet Take 2 tablet at 9am and 2 tablets at 3pm. (Patient taking differently: Take 2 tablets by mouth daily. ) 360 tablet 3  . cetirizine (ZYRTEC) 10 MG tablet Take 10 mg by mouth daily.    . Cholecalciferol (VITAMIN D) 2000 units CAPS Take 2 capsules (4,000 Units total) by mouth daily.    . ciprofloxacin (CIPRO) 500 MG tablet Take 1 tablet (500 mg total) by mouth 2 (two) times daily. 10 tablet 0  . furosemide (LASIX) 20 MG tablet Take 1 tab (20 mg daily); if weight trending up above 148 lb with swelling or shortness of breath increase lasix for 40 mg (2 tabs) daily for 3-5 days until swelling resolves (Patient taking differently: Take 20 mg by mouth daily. ) 90 tablet 1  . gabapentin (NEURONTIN) 100 MG capsule Take 1-2 capsules (100-200 mg total) by mouth 3 (three) times daily as needed. (Patient taking differently: Take 100-200 mg by mouth 3 (three) times daily as needed (for itching). ) 180 capsule 2  . HYDROcodone-acetaminophen (NORCO/VICODIN) 5-325 MG tablet Take 1 tablet by mouth every 6 (six) hours as needed. 12 tablet 0  . hydrOXYzine (ATARAX/VISTARIL) 25 MG tablet Take 1 tablet (25 mg total) by mouth every 6 (six) hours as needed for itching. Use sparingly; may cause sedation. 30 tablet 0  . meloxicam (MOBIC) 15 MG tablet Take one tablet daily as needed for pain.  Take with food.  Avoid ibuprofen but ok to  take tylenol. (Patient taking differently: Take 15 mg by mouth daily as needed for pain. Take with food.  Avoid ibuprofen but ok to take tylenol.) 90 tablet 2  . metoprolol tartrate (LOPRESSOR) 25 MG tablet Take 1/2 tablet 2 x /day (Patient taking differently: Take 12.5 mg by mouth 2 (two) times daily. ) 90 tablet 3  . mirtazapine (REMERON) 15 MG tablet Take 1 tablet at Bedtime for Sleep & Appetite 90 tablet 1  . omeprazole (PRILOSEC) 40 MG capsule Take 1 capsule Daily for Indigestion & Heartburn (Patient taking differently: Take 40 mg by mouth every morning. for Indigestion & Heartburn) 90 capsule 3  . ondansetron (ZOFRAN ODT) 8 MG disintegrating tablet Take 1 tablet (8 mg  total) by mouth every 8 (eight) hours as needed for nausea or vomiting. 12 tablet 0  . ondansetron (ZOFRAN) 4 MG tablet Take 1 tablet (4 mg total) by mouth every 6 (six) hours as needed for nausea or vomiting. 12 tablet 0  . OXYGEN Inhale 2 L into the lungs daily as needed (for shortness of breath). Setting is on 2 when needed     . predniSONE (DELTASONE) 10 MG tablet Take 1 tablet 3 x /day or as directed (Patient taking differently: Take 10 mg by mouth daily. ) 270 tablet 0  . sertraline (ZOLOFT) 50 MG tablet Take 0.5 tablets (25 mg total) by mouth daily. 90 tablet 1  . traMADol (ULTRAM) 50 MG tablet Take 1 tablet (50 mg total) by mouth every 6 (six) hours as needed. 60 tablet 5  . TRELEGY ELLIPTA 100-62.5-25 MCG/INH AEPB Inhale 1 puff into the lungs daily.   0  . triamcinolone ointment (KENALOG) 0.1 % Apply 1 application topically 2 (two) times daily. 80 g 1   No current facility-administered medications for this visit.    Physical Exam BP 122/76   Pulse 74   Temp (!) 96.4 F (35.8 C)   Ht 5\' 4"  (1.626 m)   BMI 23.84 kg/m  Body mass index is 23.84 kg/m.  Well developed and well nourished  Stands with difficulty pretty much wheelchair-bound  alert and oriented x 3  Normal affect and mood  Ortho Exam  Right leg  is completely bruised and ecchymotic from the foot all the way to the groin.  It is most pronounced in the proximal thigh and knee.  She is tender over the medial femoral condyle proximal thigh.  However the cruciate ligaments feel stable and there is no collateral ligament instability the compartments are soft muscle tone is normal   Data Reviewed IMAGING From THE ER AND THE REPORT ARE REVIEWED, MY INTERPRETATION OF THE IMAGE(S) IS : 4 views right knee mild degenerative changes symmetrical joint space narrowing no acute fracture  Dr. Darcella Cheshire read the report as degenerative changes right knee without acute fracture or dislocation suspect remote MCL injury extensive soft tissue swelling anteriorly.  ER records have been reviewed.  Patient was placed in a knee immobilizer after evaluation revealed no acute fracture.  History confirmed as a fall getting out of bed.  Assessment  Encounter Diagnosis  Name Primary?  . Contusion of right leg, initial encounter Yes     Contusion right knee cannot completely rule out medial femoral condyle fracture to explain this excessive bleeding unless it is from the prednisone and her easy bleeding bruising history  Plan   DonJoy playmaker brace weight-bear as tolerated  Ice 4-week out from injury  Follow-up in 3 weeks  Meds ordered this encounter  Medications  . traMADol (ULTRAM) 50 MG tablet    Sig: Take 1 tablet (50 mg total) by mouth every 6 (six) hours as needed.    Dispense:  60 tablet    Refill:  5     Arther Abbott, MD 11/15/2019 11:34 AM

## 2019-11-19 ENCOUNTER — Other Ambulatory Visit: Payer: Self-pay | Admitting: Internal Medicine

## 2019-11-20 ENCOUNTER — Ambulatory Visit: Payer: Self-pay | Admitting: Physician Assistant

## 2019-11-20 ENCOUNTER — Other Ambulatory Visit: Payer: Self-pay

## 2019-11-20 DIAGNOSIS — I5032 Chronic diastolic (congestive) heart failure: Secondary | ICD-10-CM

## 2019-11-20 MED ORDER — FUROSEMIDE 20 MG PO TABS: ORAL_TABLET | ORAL | 1 refills | Status: DC

## 2019-11-25 ENCOUNTER — Emergency Department (HOSPITAL_COMMUNITY)
Admission: EM | Admit: 2019-11-25 | Discharge: 2019-11-25 | Disposition: A | Discharge status: Home / Self Care | Payer: Medicare Other | Attending: Emergency Medicine | Admitting: Emergency Medicine

## 2019-11-25 ENCOUNTER — Emergency Department (HOSPITAL_COMMUNITY): Payer: Medicare Other

## 2019-11-25 ENCOUNTER — Encounter (HOSPITAL_COMMUNITY): Payer: Self-pay | Admitting: *Deleted

## 2019-11-25 DIAGNOSIS — M79604 Pain in right leg: Secondary | ICD-10-CM | POA: Diagnosis not present

## 2019-11-25 DIAGNOSIS — J449 Chronic obstructive pulmonary disease, unspecified: Secondary | ICD-10-CM | POA: Insufficient documentation

## 2019-11-25 DIAGNOSIS — I10 Essential (primary) hypertension: Secondary | ICD-10-CM | POA: Diagnosis not present

## 2019-11-25 DIAGNOSIS — G20 Parkinson's disease: Secondary | ICD-10-CM | POA: Diagnosis not present

## 2019-11-25 DIAGNOSIS — M79605 Pain in left leg: Secondary | ICD-10-CM | POA: Insufficient documentation

## 2019-11-25 DIAGNOSIS — I5032 Chronic diastolic (congestive) heart failure: Secondary | ICD-10-CM | POA: Diagnosis not present

## 2019-11-25 DIAGNOSIS — R52 Pain, unspecified: Secondary | ICD-10-CM | POA: Diagnosis not present

## 2019-11-25 DIAGNOSIS — S79912A Unspecified injury of left hip, initial encounter: Secondary | ICD-10-CM | POA: Diagnosis not present

## 2019-11-25 DIAGNOSIS — R102 Pelvic and perineal pain: Secondary | ICD-10-CM | POA: Diagnosis not present

## 2019-11-25 DIAGNOSIS — S3992XA Unspecified injury of lower back, initial encounter: Secondary | ICD-10-CM | POA: Diagnosis not present

## 2019-11-25 HISTORY — DX: Parkinson's disease: G20

## 2019-11-25 HISTORY — DX: Parkinson's disease without dyskinesia, without mention of fluctuations: G20.A1

## 2019-11-25 MED ORDER — PREDNISONE 20 MG PO TABS: ORAL_TABLET | ORAL | 0 refills | Status: DC

## 2019-11-25 MED ORDER — METHOCARBAMOL 500 MG PO TABS
500.0000 mg | ORAL_TABLET | Freq: Once | ORAL | Status: AC
  Administered 2019-11-25: 500 mg via ORAL
  Filled 2019-11-25: qty 1

## 2019-11-25 MED ORDER — FENTANYL CITRATE (PF) 100 MCG/2ML IJ SOLN
25.0000 ug | Freq: Once | INTRAMUSCULAR | Status: AC
  Administered 2019-11-25: 25 ug via INTRAMUSCULAR
  Filled 2019-11-25: qty 2

## 2019-11-25 MED ORDER — METHOCARBAMOL 500 MG PO TABS: 500.0000 mg | ORAL_TABLET | Freq: Four times a day (QID) | ORAL | 0 refills | Status: DC | PRN

## 2019-11-25 NOTE — ED Triage Notes (Signed)
Pt recently seen for fall and resulting right leg injury. Here now for shooting pain in upper left thigh 10/10.

## 2019-11-25 NOTE — ED Provider Notes (Signed)
Emergency Department Provider Note   I have reviewed the triage vital signs and the nursing notes.   HISTORY  Chief Complaint Leg Pain   HPI Marie [name] is a 84 y.o. female with medical problems documented below who presents the emergency department today with left leg pain.  Appears that the patient fell a couple weeks ago and came here and was having right leg pain.  She states that seems to be improving although still there but recently over the last day or 2 she has had left leg pain.  She states is very excruciating shooting pain on the outside of her left leg it comes and goes.  Does not seem to be associated with position.  No new falls.  No other new symptoms.  Does not hurt at this current moment but hurts so bad tonight that brought her to the emergency room for further evaluation.  She has no back pain or urinary symptoms.  No other associated symptoms.   No other associated or modifying symptoms.    Past Medical History:  Diagnosis Date  . Balance disorder 2008.     Falls a lot.  She can be standing and then leans too far over to one side   . Bulging disc   . Chronic bronchitis ([place])    due to congestion at times, on prednisone and advair  . COPD (chronic obstructive pulmonary disease) ([place])   . COPD with acute exacerbation ([place]) 12/15/2017  . Depression    many years ago  . GERD (gastroesophageal reflux disease)   . Hearing loss    mild  . Hyperlipidemia   . Hypertension   . Iatrogenic adrenal insufficiency ([place])   . Incontinence    not indicated at this visit.  [place] [facility] LVH (left ventricular hypertrophy) due to hypertensive disease    a. 02/2017: echo showing an EF of 65-70% with moderate LVH, hyperdynamic LV with subvalvular gradient of 70 mmHg, SAM, and mild MR  . Neuropathy    legs stay numb   . Osteoarthritis    hands,   . Osteoporosis   . Parkinson's disease ([place])   . Shortness of breath dyspnea   . Tubulovillous adenoma of rectum   . Wears dentures    . Wears glasses     Patient Active Problem List   Diagnosis Date Noted  . Asthmatic bronchitis 01/17/2018  . Chronic diastolic CHF (congestive heart failure) ([place]) 12/15/2017  . Anxiety 12/15/2017  . Subvalvular aortic stenosis determined by imaging 11/15/2017  . Problem with medical care compliance 11/11/2017  . Aortic atherosclerosis ([place]) 10/19/2017  . Dysphagia 09/28/2017  . Chronic respiratory failure with hypoxia, on home O2 therapy ([place]) 09/20/2017  . Hypertensive heart disease 03/29/2017  . Protein-calorie malnutrition, severe 03/17/2017  . 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 Restrictive lung disease 02/24/2017  . LVH (left ventricular hypertrophy) 01/07/2017  . Depression, major, in remission ([place]) 09/22/2016  . At high risk for falls 10/16/2015  . Prediabetes 08/23/2014  . Vitamin D deficiency 08/23/2014  . Medication management 08/23/2014  . Unstable Gait 01/30/2014  . Incontinence   . Hyperlipidemia   . GERD (gastroesophageal reflux disease)   . Hereditary and idiopathic peripheral neuropathy   . Essential hypertension   . Tubulovillous adenoma of rectum 09/04/2011    Past Surgical History:  Procedure Laterality Date  . ABDOMINAL HYSTERECTOMY    . APPENDECTOMY    . CHONDROPLASTY  09/19/2014   Procedure: CHONDROPLASTY;  Surgeon: Karen Chafe  Berenice Primas, MD;  Location: [facility];  Service: Orthopedics;;  . DILATION AND CURETTAGE OF UTERUS    . EXTERNAL FIXATION LEG  10/25/2012   Procedure: EXTERNAL FIXATION LEG;  Surgeon: Rozanna Box, MD;  Location: [facility];  Service: Orthopedics;  Laterality: Right;  . EYE SURGERY     bilateral cataract surgery and lens implant  . FINGER SURGERY     fusions and debridements for OA  . INCONTINENCE SURGERY     multiple procedures, not cured  . KNEE ARTHROSCOPY WITH LATERAL MENISECTOMY Right 09/19/2014   Procedure: KNEE ARTHROSCOPY WITH LATERAL MENISECTOMY;  Surgeon: Alta Corning, MD;  Location: [facility];  Service: Orthopedics;  Laterality: Right;  . KNEE ARTHROSCOPY WITH MEDIAL MENISECTOMY Right 09/19/2014   Procedure: RIGHT KNEE ARTHROSCOPY WITH MEDIAL AND LATERAL MENISECTOMIES. CHONDROPLASTY OF PATELLA-FEMORAL JOINT;  Surgeon: Alta Corning, MD;  Location: [facility];  Service: Orthopedics;  Laterality: Right;  . RECTAL BIOPSY  09/21/2011   Procedure: BIOPSY RECTAL;  Surgeon: Merrie Roof, MD;  Location: [facility];  Service: General;  Laterality: N/A;  3-4 cm  . RECTAL SURGERY     by dr. Marlou Starks, removal of polyp  . TONSILLECTOMY      Current Outpatient Rx  . Order #: [id number] Class: Normal  . Order #: [id number] Class: Historical Med  . Order #: [id number] Class: Normal  . Order #: [id number] Class: Historical Med  . Order #: [id number] Class: OTC  . Order #: [id number] Class: Normal  . Order #: [id number] Class: No Print  . Order #: [id number] Class: Normal  . Order #: [id number] Class: Normal  . Order #: [id number] Class: Normal  . Order #: [id number] Class: Normal  . Order #: [id number] Class: Normal  . Order #: [id number] Class: Normal  . Order #: [id number] Class: Normal  . Order #: [id number] Class: Normal  . Order #: [id number] Class: Normal  . Order #: [id number] Class: Normal  . Order #: [id number] Class: Historical Med  . Order #: [id number] Class: Normal  . Order #: [id number] Class: Print  . Order #: [id number] Class: Normal  . Order #: [id number] Class: Normal  . Order #: [id number] Class: Historical Med  . Order #: [id number] Class: Normal    Allergies Demerol  [meperidine hcl], Ertapenem, Codeine, Morphine and related, Other, and Sulfa antibiotics  Family History  Problem Relation Age of Onset  . Cancer Sister        pt unaware of what kind  . High blood pressure Mother   . COPD Mother   . Heart attack Father     Social History Social History   Tobacco Use  . Smoking status: Never Smoker  . Smokeless tobacco: Never Used  Substance Use Topics  . Alcohol use: No  .  Drug use: No    Review of Systems  All other systems negative except as documented in the HPI. All pertinent positives and negatives as reviewed in the HPI. ____________________________________________   PHYSICAL EXAM:  VITAL SIGNS: ED Triage Vitals  Enc Vitals Group     BP --      Pulse --      Resp --      Temp 11/25/19 0036 98.3 F (36.8 C)     Temp Source 11/25/19 0036 Oral     SpO2 --      Weight 11/25/19 0034 140 lb (63.5 kg)     Height 11/25/19 0034 5' 4.5" (1.638 m)     Head Circumference --      Peak Flow --  Pain Score 11/25/19 0034 10     Pain Loc --      Pain Edu? --      Excl. in [place]? --     Constitutional: Alert and oriented. Well appearing and in no acute distress. Eyes: Conjunctivae are normal. PERRL. EOMI. Head: Atraumatic. Nose: No congestion/rhinnorhea. Mouth/Throat: Mucous membranes are moist.  Oropharynx non-erythematous. Neck: No stridor.  No meningeal signs.   Cardiovascular: Normal rate, regular rhythm. Good peripheral circulation. Grossly normal heart sounds.   Respiratory: Normal respiratory effort.  No retractions. Lungs CTAB. Gastrointestinal: Soft and nontender. No distention.  Musculoskeletal: No lower extremity tenderness nor edema. No gross deformities of extremities. Neurologic:  Normal speech and language. No gross focal neurologic deficits are appreciated.  Skin:  Skin is warm, dry and intact. No rash noted.   ____________________________________________   RADIOLOGY  DG Lumbar Spine Complete  Result Date: 11/25/2019 CLINICAL DATA:  Fall pain he EXAM: LUMBAR SPINE - COMPLETE 4+ VIEW COMPARISON:  None. FINDINGS: There is a chronic superior compression deformity of the L4 vertebral body which dates back to 2016 with approximately 30% loss in anterior vertebral body height. No acute fracture or malalignment is seen. Disc height loss and facet arthropathy most notable at L5-S1. Dense vascular calcifications are noted. There is diffuse  osteopenia. IMPRESSION: No acute fracture or malalignment. Chronic superior compression deformity of the L4 vertebral body with approximately 30% loss of vertebral body height. Electronically Signed   By: Prudencio Pair M.D.   On: 11/25/2019 02:41   DG Pelvis 1-2 Views  Result Date: 11/25/2019 CLINICAL DATA:  Fall and pain EXAM: PELVIS - 1-2 VIEW COMPARISON:  None. FINDINGS: No fracture or dislocation. There is diffuse osteopenia. Mild bilateral hip osteoarthritis is seen with superior joint space loss. IMPRESSION: Negative. Electronically Signed   By: Prudencio Pair M.D.   On: 11/25/2019 02:41   DG Femur Min 2 Views Left  Result Date: 11/25/2019 CLINICAL DATA:  Hip pain after fall EXAM: LEFT FEMUR 2 VIEWS COMPARISON:  None. FINDINGS: There is question of a linear lucency seen at the posterior aspect of the greater trochanter which could represent a nondisplaced fracture versus fragmented enthesophyte. There is diffuse osteopenia. Mild hip osteoarthritis is seen superior joint space loss and marginal osteophyte formation. Dense vascular calcifications are noted. IMPRESSION: Linear lucency at the posterior greater trochanter which could represent a nondisplaced fracture versus fragmented enthesophyte. Electronically Signed   By: Prudencio Pair M.D.   On: 11/25/2019 02:44    ____________________________________________   INITIAL IMPRESSION / ASSESSMENT AND PLAN / ED COURSE  eval for occult fracture, e/o spinal compression although with the intermittent nature suspect possibly muscular/ligamentous cause.  xr's with questionable greater trochanter fracture, but pain seems to be more intermittent in nature making muscle/neruo  Cause mor elikely. Will treat for both. Discussed with patient fu w/ pcp or her ortho if not improving.   Pertinent labs & imaging results that were available during my care of the patient were reviewed by me and considered in my medical decision making (see chart for details).  A  medical screening exam was performed and I feel the patient has had an appropriate workup for their chief complaint at this time and likelihood of emergent condition existing is low. They have been counseled on decision, discharge, follow up and which symptoms necessitate immediate return to the emergency department. They or their family verbally stated understanding and agreement with plan and discharged in stable condition.   ____________________________________________  FINAL CLINICAL IMPRESSION(S) / ED DIAGNOSES  Final diagnoses:  Left leg pain    MEDICATIONS GIVEN DURING THIS VISIT:  Medications  fentaNYL (SUBLIMAZE) injection 25 mcg (25 mcg Intramuscular Given 11/25/19 0127)  methocarbamol (ROBAXIN) tablet 500 mg (500 mg Oral Given 11/25/19 0124)    NEW OUTPATIENT MEDICATIONS STARTED DURING THIS VISIT:  New Prescriptions   METHOCARBAMOL (ROBAXIN) 500 MG TABLET    Take 1 tablet (500 mg total) by mouth every 6 (six) hours as needed for muscle spasms.   PREDNISONE (DELTASONE) 20 MG TABLET    3 tabs po daily x 3 days, then 2 tabs x 3 days, then 1.5 tabs x 3 days, then 1 tab x 3 days, then 0.5 tabs x 3 days Hold your daily 10 mg dose while doing this taper    Note:  This note was prepared with assistance of Dragon voice recognition software. Occasional wrong-word or sound-a-like substitutions may have occurred due to the inherent limitations of voice recognition software.   Tavarius Grewe, Corene Cornea, MD 11/25/19 [phone]

## 2019-11-28 DIAGNOSIS — L309 Dermatitis, unspecified: Secondary | ICD-10-CM | POA: Diagnosis not present

## 2019-11-28 DIAGNOSIS — R21 Rash and other nonspecific skin eruption: Secondary | ICD-10-CM | POA: Diagnosis not present

## 2019-11-29 ENCOUNTER — Telehealth: Payer: Medicare Other | Admitting: Neurology

## 2019-11-30 ENCOUNTER — Telehealth: Payer: Medicare Other | Admitting: Neurology

## 2019-12-01 ENCOUNTER — Other Ambulatory Visit: Payer: Self-pay

## 2019-12-01 DIAGNOSIS — I5032 Chronic diastolic (congestive) heart failure: Secondary | ICD-10-CM

## 2019-12-01 MED ORDER — FUROSEMIDE 20 MG PO TABS: ORAL_TABLET | ORAL | 1 refills | Status: DC

## 2019-12-06 ENCOUNTER — Ambulatory Visit (INDEPENDENT_AMBULATORY_CARE_PROVIDER_SITE_OTHER): Payer: Medicare Other | Admitting: Orthopedic Surgery

## 2019-12-06 ENCOUNTER — Other Ambulatory Visit: Payer: Self-pay

## 2019-12-06 VITALS — BP 102/60 | HR 67 | Temp 96.9°F | Ht 64.0 in

## 2019-12-06 DIAGNOSIS — S8011XD Contusion of right lower leg, subsequent encounter: Secondary | ICD-10-CM

## 2019-12-06 NOTE — Patient Instructions (Signed)
The hematoma will resolve by itself

## 2019-12-06 NOTE — Progress Notes (Signed)
Chief Complaint  Patient presents with  . Follow-up    Recheck on right ankle and right knee. DOI 11-12-19.   84 years old a recheck on her right foot and ankle and right knee after fall back in January she developed a hematoma had some instability in the right knee.  We gave her a brace that seemed to help her knee  She went to the ER again with some spontaneous ecchymosis on the medial side of her left leg cause unknown  She has had coagulation studies and platelet counts which were on the high side of normal in terms of her INR and PT.  Platelet count was 300,000  She has a large hematoma in the right leg with no evidence of compartment syndrome she has a blister over the right ankle which seems to be improving  Recommend expectant treatment and follow-up in  2 months  Encounter Diagnosis  Name Primary?  . Contusion of right lower extremity, subsequent encounter Yes

## 2019-12-08 ENCOUNTER — Other Ambulatory Visit: Payer: Self-pay

## 2019-12-08 MED ORDER — HYDROXYZINE HCL 25 MG PO TABS: 25.0000 mg | ORAL_TABLET | Freq: Four times a day (QID) | ORAL | 0 refills | Status: AC | PRN

## 2019-12-12 NOTE — Progress Notes (Signed)
MEDICARE ANNUAL WELLNESS VISIT AND FOLLOW UP  Assessment:    Atherosclerosis of aorta (HCC) Control blood pressure, cholesterol, glucose, increase exercise.   Chronic diastolic CHF (congestive heart failure) ([place]) -     Ambulatory referral to Home Health -     Amb Referral to Palliative Care - continue lasix, monitor weight and kidney function  Chronic respiratory failure with hypoxia, on home O2 therapy ([place]) -     Ambulatory referral to [facility] -     Amb Referral to Palliative Care  - continue O2 as needed  Venous stasis ulcer of other part of right lower leg limited to breakdown of skin without varicose veins (HCC) -     Ambulatory referral to [place] - right leg without evidence of infection but + bullous and ulcers from venous insufficiency and recent fall with hematoma - patient would benefit from wound care and unna boots to prevent infection and worsening of the ulcers  Restrictive lung disease -     Amb Referral to Palliative Care Continue follow up with pulmonary as needed - patient has chronic serious health conditions, would benefit from palliative care  Parkinson's plus syndrome ([place]) -     Ambulatory referral to [place] -     Amb Referral to Palliative Care - would benefit from PT, patient has not been walking much due to her leg swelling/pain Now has deconditioning A goal of hers is to try to be mobile again, currently she is in a wheel chair, will set up at home PT since it is difficult for the patient to leave the house on her own.   Depression, major, in remission ([place]) Monitor  Severe protein-calorie malnutrition ([place]) -     Ambulatory referral to [facility] -     Amb Referral to Palliative Care - add ensure  Essential hypertension -     CBC with Differential/Platelet -     COMPLETE METABOLIC PANEL WITH GFR -     TSH - continue medications, DASH diet, exercise and monitor at home. Call if greater than 130/80.   Hypertensive heart  disease with chronic diastolic congestive heart failure ([place]) - continue medications, DASH diet, exercise and monitor at home. Call if greater than 130/80.   LVH (left ventricular hypertrophy) Control blood pressure, cholesterol, glucose, increase exercise.   Subvalvular aortic stenosis determined by imaging  At high risk for falls Will do at home PT with home health due to high fall risk and deconditioning from parkinson's, recent fall, and chronic respiratory hypoxia on O2  Mixed hyperlipidemia -     Lipid panel check lipids decrease fatty foods increase activity.   Medication management -     Magnesium  Abnormal glucose -     Hemoglobin A1c Discussed disease progression and risks Discussed diet/exercise, weight management and risk modification  Vitamin D deficiency -     VITAMIN D 25 Hydroxy (Vit-D Deficiency, Fractures)  Unstable Gait Suggest PT  Urinary incontinence, unspecified type Monitor  Anxiety Stop zoloft, increase remeron  Hereditary and idiopathic peripheral neuropathy Monitor, suggest PT  Oropharyngeal dysphagia Declines OT at this time    Over 40 minutes of exam, counseling, chart review and critical decision making was performed Future Appointments  Date Time Provider [place]  01/22/2020  3:30 PM Alda Berthold, DO [facility] None  Feb 05, 2020 11:20 AM Carole Civil, MD [facility] None  06/17/2020  2:00 PM [name], MD [facility] None     Plan:  During the course of the visit the patient was educated and counseled about appropriate screening and preventive services including:    Pneumococcal vaccine   Prevnar 13  Influenza vaccine  Td vaccine  Screening electrocardiogram  Bone densitometry screening  Colorectal cancer screening  Diabetes screening  Glaucoma screening  Nutrition counseling   Advanced directives: requested   Subjective:  Marie [name] is a 84 y.o. Caucasian female with hypertensive  heart disease, COPD, parkinson's plus syndrome, oropharyngeal dysphagia, ?chronic respiratory failure with home O2 who presents for [facility] Annual Wellness Visit .    BP Readings from Last 3 Encounters:  12/13/19 118/80  12/06/19 102/60  11/25/19 (!) 158/60     The daughter is still managing medications and coordinate pill box weekly.  She has someone that comes out throught the [facility] program in [place], comes out 9-5pm. Daughter states she is not eating much, she was put on remeron last visit and states this is not helping at this dose.    She is very high risk for falls, uses a walker/has a wheelchair for long distances and daughter states she is not doing much walking due to the leg injury, her last fall was in Jan. She states she would like   She has had 2 rounds of ABX, cipro for UTI and her Dermatologist put her on keflex for itching which she just finished a few days.   She went to the ER 11/25/19 for left leg pain. Had Lumbar Xray with no acute fracture, just continuing deformity L4 vertebrae and pelvic/hip xray showed possible greater trochanter fracture. Saw Arther Abbott in ortho surgery 11/15/19 and 12/06/2019, had hematoma blisters over the right ankle.  Tramadol, meloxicam, robaxin and norco are as needed, has not been on in several weeks.   She has had multiple ER visits, 3 visits in just 2021.   On home O2 therapy - 2L at home as needed to maintain saturations above 88%. On trelegy and doing well.   She has unchanged bilateral edema, weight is stable. Marland Kitchen  BMI is Body mass index is 23.17 kg/m., she has not been working on diet and exercise. Wt Readings from Last 3 Encounters:  12/13/19 135 lb (61.2 kg)  11/25/19 140 lb (63.5 kg)  11/12/19 138 lb 14.2 oz (63 kg)   Her cholesterol is at goal. The cholesterol last visit was:   Lab Results  Component Value Date   CHOL 171 06/06/2019   HDL 57 06/06/2019   LDLCALC 93 06/06/2019   TRIG 117 06/06/2019   CHOLHDL 3.0  06/06/2019    She has been working on diet and exercise for prediabetes, and denies paresthesia of the feet, polydipsia, polyuria and visual disturbances. Last A1C in the office was:  Lab Results  Component Value Date   HGBA1C 6.3 (H) 06/06/2019   Patient is on Vitamin D supplement.   Lab Results  Component Value Date   VD25OH 30 06/06/2019      Medication Review:  Current Outpatient Medications (Endocrine & Metabolic):  .  predniSONE (DELTASONE) 10 MG tablet, Take 1 tablet 3 x /day as directed for COPD  Current Outpatient Medications (Cardiovascular):  .  furosemide (LASIX) 20 MG tablet, Take 1 tab (20 mg daily); if weight trending up above 148 lb with swelling or shortness of breath increase lasix for 40 mg (2 tabs) daily for 3-5 days until swelling resolves .  metoprolol tartrate (LOPRESSOR) 25 MG tablet, Take 1/2 tablet 2 x /day (Patient taking differently:  Take 12.5 mg by mouth 2 (two) times daily. )  Current Outpatient Medications (Respiratory):  .  cetirizine (ZYRTEC) 10 MG tablet, Take 10 mg by mouth daily. .  TRELEGY ELLIPTA 100-62.5-25 MCG/INH AEPB, Inhale 1 puff into the lungs daily.   Current Outpatient Medications (Analgesics):  .  aspirin 81 MG tablet, Take 81 mg by mouth daily.  .  meloxicam (MOBIC) 15 MG tablet, Take one tablet daily as needed for pain.  Take with food.  Avoid ibuprofen but ok to take tylenol. (Patient taking differently: Take 15 mg by mouth daily as needed for pain. Take with food.  Avoid ibuprofen but ok to take tylenol.) .  traMADol (ULTRAM) 50 MG tablet, Take 1 tablet (50 mg total) by mouth every 6 (six) hours as needed.   Current Outpatient Medications (Other):  Marland Kitchen  ALPRAZolam (XANAX) 0.5 MG tablet, Take 1/2-1 tablet 2 - 3 x /day ONLY if needed for Anxiety Attack &  limit to 5 days /week to avoid addiction (Patient taking differently: Take 0.25-0.5 mg by mouth 3 (three) times daily as needed for sleep. ONLY if needed for Anxiety Attack &  limit to  5 days /week to avoid addiction) .  carbidopa-levodopa (SINEMET IR) 25-100 MG tablet, Take 2 tablet at 9am and 2 tablets at 3pm. (Patient taking differently: Take 2 tablets by mouth daily. ) .  Cholecalciferol (VITAMIN D) 2000 units CAPS, Take 2 capsules (4,000 Units total) by mouth daily. Marland Kitchen  gabapentin (NEURONTIN) 100 MG capsule, Take 1-2 capsules (100-200 mg total) by mouth 3 (three) times daily as needed. (Patient taking differently: Take 100-200 mg by mouth 3 (three) times daily as needed (for itching). ) .  hydrOXYzine (ATARAX/VISTARIL) 25 MG tablet, Take 1 tablet (25 mg total) by mouth every 6 (six) hours as needed for itching. Use sparingly; may cause sedation. .  methocarbamol (ROBAXIN) 500 MG tablet, Take 1 tablet (500 mg total) by mouth every 6 (six) hours as needed for muscle spasms. .  mirtazapine (REMERON) 15 MG tablet, Take 1 tablet at Bedtime for Sleep & Appetite .  omeprazole (PRILOSEC) 40 MG capsule, Take 1 capsule Daily for Indigestion & Heartburn (Patient taking differently: Take 40 mg by mouth every morning. for Indigestion & Heartburn) .  ondansetron (ZOFRAN ODT) 8 MG disintegrating tablet, Take 1 tablet (8 mg total) by mouth every 8 (eight) hours as needed for nausea or vomiting. .  ondansetron (ZOFRAN) 4 MG tablet, Take 1 tablet (4 mg total) by mouth every 6 (six) hours as needed for nausea or vomiting. .  OXYGEN, Inhale 2 L into the lungs daily as needed (for shortness of breath). Setting is on 2 when needed  .  sertraline (ZOLOFT) 50 MG tablet, Take 0.5 tablets (25 mg total) by mouth daily. Marland Kitchen  triamcinolone ointment (KENALOG) 0.1 %, Apply 1 application topically 2 (two) times daily.   Allergies  Allergen Reactions  . Demerol  [Meperidine Hcl] Other (See Comments)    Pt. Feels like she is suffocating  . Ertapenem Hives    Caused whole body to turn red Other reaction(s): Redness Caused whole body to turn red  . Codeine Other (See Comments)    hallucinations  . Morphine  And Related Other (See Comments)    hallucinations  . Other Other (See Comments)    Invan 7- pt became red all over  . Sulfa Antibiotics Rash    Current Problems (verified) Patient Active Problem List   Diagnosis Date Noted  . Asthmatic  bronchitis 01/17/2018  . Chronic diastolic CHF (congestive heart failure) ([place]) 12/15/2017  . Anxiety 12/15/2017  . Subvalvular aortic stenosis determined by imaging 11/15/2017  . Problem with medical care compliance 11/11/2017  . Aortic atherosclerosis ([place]) 10/19/2017  . Dysphagia 09/28/2017  . Chronic respiratory failure with hypoxia, on home O2 therapy ([place]) 09/20/2017  . Hypertensive heart disease 03/29/2017  . Protein-calorie malnutrition, severe 03/17/2017  . Parkinson's plus syndrome ([place]) 03/04/2017  . Restrictive lung disease 02/24/2017  . LVH (left ventricular hypertrophy) 01/07/2017  . Depression, major, in remission ([place]) 09/22/2016  . At high risk for falls 10/16/2015  . Abnormal glucose 08/23/2014  . Vitamin D deficiency 08/23/2014  . Medication management 08/23/2014  . Unstable Gait 01/30/2014  . Incontinence   . Hyperlipidemia   . GERD (gastroesophageal reflux disease)   . Hereditary and idiopathic peripheral neuropathy   . Essential hypertension   . Tubulovillous adenoma of rectum 09/04/2011    Screening Tests Immunization History  Administered Date(s) Administered  . Influenza, High Dose Seasonal PF 08/23/2014, 07/24/2015, 08/13/2016, 07/12/2017, 07/13/2018  . Pneumococcal Conjugate-13 08/23/2014  . Pneumococcal-Unspecified 07/20/2003, 08/17/2013  . Td 08/17/2013  . Tdap 10/25/2012  . Zoster 03/19/2015   Preventative care: Last colonoscopy: 2012- will not get another due to age/comorbid conditions. Last mammogram: 04/2018 Last pap smear/pelvic exam: remote   DEXA: 2016 - osteopenia T-2.2 at last -need to get PFTs 2018 MRI brain 2018  Prior vaccinations: TD or Tdap: 2014  Influenza: 2020 Pneumococcal:  2014 Prevnar13: 2015 Shingles/Zostavax: 2016  Names of Other Physician/Practitioners you currently use: 1. [facility] here for primary care 2. Dr. Jorja Loa, eye doctor, last visit 2020 3. Harrington Challenger, dentist, last visit Nov 2018  Patient Care Team: [name], MD as PCP - General (Internal Medicine) Dorna Leitz, MD as Consulting Physician (Orthopedic Surgery) Teena Irani, MD (Inactive) as Consulting Physician (Gastroenterology) Garrel Ridgel, [place] as Consulting Physician (Podiatry) Juanito Doom, MD as Consulting Physician (Pulmonary Disease) Alda Berthold, DO as Consulting Physician (Neurology) Liane Comber, NP as Nurse Practitioner (Nurse Practitioner)  SURGICAL HISTORY She  has a past surgical history that includes Incontinence surgery; Rectal surgery; Appendectomy; Abdominal hysterectomy; Tonsillectomy; Eye surgery; Dilation and curettage of uterus; Rectal biopsy (09/21/2011); Finger surgery; External fixation leg (10/25/2012); Knee arthroscopy with medial menisectomy (Right, 09/19/2014); Knee arthroscopy with lateral menisectomy (Right, 09/19/2014); and Chondroplasty (09/19/2014).   FAMILY HISTORY Her family history includes COPD in her mother; Cancer in her sister; Heart attack in her father; High blood pressure in her mother.   SOCIAL HISTORY She  reports that she has never smoked. She has never used smokeless tobacco. She reports that she does not drink alcohol or use drugs.   MEDICARE WELLNESS OBJECTIVES: Physical activity:   Cardiac risk factors:   Depression/mood screen:   Depression screen [facility] 2/9 06/05/2019  Decreased Interest 0  Down, Depressed, Hopeless 0  PHQ - 2 Score 0  Some recent data might be hidden    ADLs:  In your present state of health, do you have any difficulty performing the following activities: 06/05/2019 03/03/2019  Hearing? N N  Vision? N N  Difficulty concentrating or making decisions? N N  Walking or  climbing stairs? N N  Dressing or bathing? N N  Doing errands, shopping? N N  Some recent data might be hidden     Cognitive Testing  Alert? Yes  Normal Appearance?Yes  Oriented to person? Yes  Place? Yes   Time?  Yes  Recall of three objects?  1/3  EOL planning: Does Patient Have a Medical Advance Directive?: Yes Type of Advance Directive: Healthcare Power of Attorney, Living will [place] in Chart?: No - copy requested  Review of Systems  Constitutional: Positive for weight loss. Negative for chills, diaphoresis, fever and malaise/fatigue.  HENT: Negative for congestion, hearing loss, sore throat and tinnitus.   Eyes: Negative for blurred vision and double vision.  Respiratory: Positive for shortness of breath. Negative for cough and wheezing.   Cardiovascular: Positive for leg swelling. Negative for chest pain, palpitations, orthopnea, claudication and PND.  Gastrointestinal: Negative for abdominal pain, blood in stool, constipation, diarrhea, heartburn, melena, nausea and vomiting.  Genitourinary: Negative.   Musculoskeletal: Positive for falls, joint pain and myalgias.  Skin: Positive for rash.  Neurological: Negative for dizziness, tingling, sensory change, weakness and headaches.  Endo/Heme/Allergies: Negative for polydipsia.  Psychiatric/Behavioral: Negative.  Negative for depression and memory loss. The patient is not nervous/anxious and does not have insomnia.   All other systems reviewed and are negative.    Objective:     Today's Vitals   12/13/19 1348  BP: 118/80  Temp: (!) 97.3 F (36.3 C)  Weight: 135 lb (61.2 kg)  Height: 5\' 4"  (1.626 m)  PainSc: 0-No pain   Body mass index is 23.17 kg/m.  General appearance: alert, no distress, WD/WN, frail female HEENT: normocephalic, sclerae anicteric, TMs pearly, nares patent, no discharge or erythema, pharynx normal Oral cavity: MMM, no lesions Neck: supple, no lymphadenopathy, no  thyromegaly, no masses Heart: RRR, normal S1, S2, with systolic murmur better with inspiration  Lungs: CTA bilaterally,no wheezes, rhonchi, or rales Abdomen: +bs, soft, non tender, non distended, no masses, no hepatomegaly, no splenomegaly Musculoskeletal: nontender, no swelling, no obvious deformity Extremities: right leg with 2-3+ edema with 2.5 cm bullous right anterior leg with 2x2cm erythematous ulcer with serous discharge, medial and lateral malleolus with ulcers  no cyanosis, no clubbing  Pulses: 2+ symmetric, upper and lower extremities Neurological: alert, oriented x 3, CN2-12 intact, strength symmetrical upper extremities and lower extremities except some decreased strength right leg, Bilateral feet with good pulses, decreased sensation up 1/3 of shin, cracking and dry skin, hammer toes and absent left 3rd toe. Patient in a wheel chair Psychiatric: normal affect, behavior normal, pleasant         Medicare Attestation I have personally reviewed: The patient's medical and social history Their use of alcohol, tobacco or illicit drugs Their current medications and supplements The patient's functional ability including ADLs,fall risks, home safety risks, cognitive, and hearing and visual impairment Diet and physical activities Evidence for depression or mood disorders  The patient's weight, height, BMI, and visual acuity have been recorded in the chart.  I have made referrals, counseling, and provided education to the patient based on review of the above and I have provided the patient with a written personalized care plan for preventive services.     Vicie Mutters, PA-C   12/13/2019

## 2019-12-13 ENCOUNTER — Encounter: Payer: Self-pay | Admitting: Physician Assistant

## 2019-12-13 ENCOUNTER — Ambulatory Visit (INDEPENDENT_AMBULATORY_CARE_PROVIDER_SITE_OTHER): Payer: Medicare Other | Admitting: Physician Assistant

## 2019-12-13 ENCOUNTER — Other Ambulatory Visit: Payer: Self-pay | Admitting: Internal Medicine

## 2019-12-13 ENCOUNTER — Other Ambulatory Visit: Payer: Self-pay

## 2019-12-13 VITALS — BP 118/80 | Temp 97.3°F | Ht 64.0 in | Wt 135.0 lb

## 2019-12-13 DIAGNOSIS — I110 Hypertensive heart disease with heart failure: Secondary | ICD-10-CM

## 2019-12-13 DIAGNOSIS — I700 Atherosclerosis of aorta: Secondary | ICD-10-CM

## 2019-12-13 DIAGNOSIS — I517 Cardiomegaly: Secondary | ICD-10-CM

## 2019-12-13 DIAGNOSIS — I5032 Chronic diastolic (congestive) heart failure: Secondary | ICD-10-CM | POA: Diagnosis not present

## 2019-12-13 DIAGNOSIS — Q244 Congenital subaortic stenosis: Secondary | ICD-10-CM

## 2019-12-13 DIAGNOSIS — F325 Major depressive disorder, single episode, in full remission: Secondary | ICD-10-CM | POA: Diagnosis not present

## 2019-12-13 DIAGNOSIS — J984 Other disorders of lung: Secondary | ICD-10-CM

## 2019-12-13 DIAGNOSIS — E559 Vitamin D deficiency, unspecified: Secondary | ICD-10-CM

## 2019-12-13 DIAGNOSIS — R7309 Other abnormal glucose: Secondary | ICD-10-CM

## 2019-12-13 DIAGNOSIS — R32 Unspecified urinary incontinence: Secondary | ICD-10-CM

## 2019-12-13 DIAGNOSIS — Z79899 Other long term (current) drug therapy: Secondary | ICD-10-CM | POA: Diagnosis not present

## 2019-12-13 DIAGNOSIS — E782 Mixed hyperlipidemia: Secondary | ICD-10-CM | POA: Diagnosis not present

## 2019-12-13 DIAGNOSIS — J9611 Chronic respiratory failure with hypoxia: Secondary | ICD-10-CM | POA: Diagnosis not present

## 2019-12-13 DIAGNOSIS — I872 Venous insufficiency (chronic) (peripheral): Secondary | ICD-10-CM | POA: Diagnosis not present

## 2019-12-13 DIAGNOSIS — G232 Striatonigral degeneration: Secondary | ICD-10-CM | POA: Diagnosis not present

## 2019-12-13 DIAGNOSIS — G609 Hereditary and idiopathic neuropathy, unspecified: Secondary | ICD-10-CM

## 2019-12-13 DIAGNOSIS — R1312 Dysphagia, oropharyngeal phase: Secondary | ICD-10-CM

## 2019-12-13 DIAGNOSIS — Z9981 Dependence on supplemental oxygen: Secondary | ICD-10-CM

## 2019-12-13 DIAGNOSIS — E43 Unspecified severe protein-calorie malnutrition: Secondary | ICD-10-CM | POA: Diagnosis not present

## 2019-12-13 DIAGNOSIS — R269 Unspecified abnormalities of gait and mobility: Secondary | ICD-10-CM

## 2019-12-13 DIAGNOSIS — Z0001 Encounter for general adult medical examination with abnormal findings: Secondary | ICD-10-CM

## 2019-12-13 DIAGNOSIS — I10 Essential (primary) hypertension: Secondary | ICD-10-CM

## 2019-12-13 DIAGNOSIS — Z9181 History of falling: Secondary | ICD-10-CM | POA: Diagnosis not present

## 2019-12-13 DIAGNOSIS — F419 Anxiety disorder, unspecified: Secondary | ICD-10-CM

## 2019-12-13 DIAGNOSIS — Z0000 Encounter for general adult medical examination without abnormal findings: Secondary | ICD-10-CM

## 2019-12-13 DIAGNOSIS — G20C Parkinsonism, unspecified: Secondary | ICD-10-CM

## 2019-12-13 DIAGNOSIS — L97811 Non-pressure chronic ulcer of other part of right lower leg limited to breakdown of skin: Secondary | ICD-10-CM

## 2019-12-13 DIAGNOSIS — R6889 Other general symptoms and signs: Secondary | ICD-10-CM

## 2019-12-13 MED ORDER — ALPRAZOLAM 0.5 MG PO TABS: ORAL_TABLET | ORAL | 0 refills | Status: AC

## 2019-12-13 NOTE — Patient Instructions (Addendum)
Stop the zoloft and the metoprolol  Increase the remeron to 2 pills at night and see how you are doing'  Will do home health and palliative care visit at this time.   Elevate your legs for at least 20 mins 3 x a day but do as much as you can.   Follow up 2-4 weeks   Venous Ulcer A venous ulcer is a shallow sore on your lower leg that is caused by poor circulation in your veins. This condition used to be called stasis ulcer. Venous ulcer is the most common type of lower leg ulcer. You may have venous ulcers on one leg or on both legs. The area where this condition most commonly develops is around the ankles. A venous ulcer may last for a long time (chronic ulcer) or it may return repeatedly (recurrent ulcer). What are the causes? A venous ulcer may be caused by any condition that causes poor blood flow in your legs. Veins have valves that help return blood to the heart. If these valves do not work properly:  Blood can flow backward and pool in the lower legs.  Blood can then leak out of your veins, which can irritate your skin.  Irritation can cause a break in the skin, which becomes a venous ulcer. What increases the risk? You are more likely to develop this condition if you:  Are 53 years of age or older.  Are female.  Are overweight.  Are not active.  Have had a leg ulcer in the past.  Have varicose veins.  Have clots in your lower leg veins (deep vein thrombosis).  Have inflammation of your leg veins (phlebitis).  Have recently had a pregnancy.  Use products that contain nicotine or tobacco. What are the signs or symptoms? The main symptom of this condition is an open sore near your ankle. Other symptoms may include:  Swelling.  Thickening of the skin.  Fluid leaking from the ulcer.  Bleeding.  Itching.  Pain and swelling that gets worse when you stand up and feels better when you raise your leg.  Blotchy skin.  Darkening of the skin. How is this  diagnosed? Your health care provider may suspect a venous ulcer based on your medical history and your risk factors. He or she may:  Do a physical exam.  Do other tests, such as: ? Measuring blood pressure in your arms and legs. ? Using sound waves (ultrasound) to measure blood flow in your leg veins. How is this treated? This condition may be treated by:  Keeping your leg raised (elevated).  Wearing a type of bandage or stocking to compress the veins of your leg (compression therapy).  Taking medicines to improve blood flow.  Taking antibiotic medicines to treat infection.  Cleaning your ulcer and removing any dead tissue from the wound (debridement).  Placing various types of medicated bandages (dressings) or medicated wraps on your ulcer.  Surgery to close the wound using a piece of skin taken from another area of your body (graft). This is only done for wounds that are deep or hard to heal. You may need to try several different types of treatment to get your venous ulcer to heal. Healing may take a long time. Follow these instructions at home: Medicines  Take or apply over-the-counter and prescription medicines only as told by your health care provider.  If you were prescribed an antibiotic medicine, take it as told by your health care provider. Do not stop using the  antibiotic even if you start to feel better.  Ask your health care provider if you should take aspirin before long trips. Wound care  Follow instructions from your health care provider about how to take care of your wound. Make sure you: ? Wash your hands with soap and water before and after you change your bandage (dressing). If soap and water are not available, use hand sanitizer. ? Change your dressing as told by your health care provider. ? If you had a skin graft, leave stitches (sutures) in place. These may need to stay in place for 2 weeks or longer. ? Ask when you should remove your dressing. If your  dressing is dry and sticks to your leg when you try to remove it, moisten or wet the dressing with saline solution or water so that the dressing can be removed without harming your skin or wound tissue.  When you are able to remove your dressing, check your wound every day for signs of infection. Have a caregiver do this for you if you are not able to do it yourself. Check for: ? More redness, swelling, or pain. ? More fluid or blood. ? Warmth. ? Pus or a bad smell. Activity  Avoid sitting for a long time without moving. Get up to take short walks every 1-2 hours. This is important to improve blood flow in your legs. Ask for help if you feel weak or unsteady.  Ask your health care provider what level of activity is safe for you.  Rest with your legs raised (elevated) during the day. If possible, elevate your legs above the level of your heart for 30 minutes, 3-4 times a day, or as told by your health care provider.  Do not sit with your legs crossed. General instructions   Wear elastic stockings, compression stockings, or support hose as told by your health care provider.  Raise the foot of your bed as told by your health care provider.  Do not use any products that contain nicotine or tobacco, such as cigarettes, e-cigarettes, and chewing tobacco. If you need help quitting, ask your health care provider.  Keep all follow-up visits as told by your health care provider. This is important. Contact a health care provider if:  Your ulcer is getting larger or is not healing.  Your pain gets worse. Get help right away if you have:  More redness, swelling, or pain around your ulcer.  More fluid or blood coming from your ulcer.  Warmth in the area around your ulcer.  Pus or a bad smell coming from your ulcer.  A fever. Summary  A venous ulcer is a shallow sore on your lower leg that is caused by poor circulation in your veins.  Follow instructions from your health care provider  about how to take care of your wound.  Check your wound every day for signs of infection.  Take over-the-counter and prescription medicines only as told by your health care provider.  Keep all follow-up visits as told by your health care provider. This is important. This information is not intended to replace advice given to you by your health care provider. Make sure you discuss any questions you have with your health care provider. Document Revised: 06/02/2018 Document Reviewed: 06/02/2018 Elsevier Patient Education  [place].

## 2019-12-14 ENCOUNTER — Other Ambulatory Visit: Payer: Self-pay

## 2019-12-14 DIAGNOSIS — S8011XD Contusion of right lower leg, subsequent encounter: Secondary | ICD-10-CM | POA: Diagnosis not present

## 2019-12-14 DIAGNOSIS — E559 Vitamin D deficiency, unspecified: Secondary | ICD-10-CM | POA: Diagnosis not present

## 2019-12-14 DIAGNOSIS — R7303 Prediabetes: Secondary | ICD-10-CM | POA: Diagnosis not present

## 2019-12-14 DIAGNOSIS — J9611 Chronic respiratory failure with hypoxia: Secondary | ICD-10-CM | POA: Diagnosis not present

## 2019-12-14 DIAGNOSIS — I5032 Chronic diastolic (congestive) heart failure: Secondary | ICD-10-CM | POA: Diagnosis not present

## 2019-12-14 DIAGNOSIS — Z9981 Dependence on supplemental oxygen: Secondary | ICD-10-CM | POA: Diagnosis not present

## 2019-12-14 DIAGNOSIS — E782 Mixed hyperlipidemia: Secondary | ICD-10-CM | POA: Diagnosis not present

## 2019-12-14 DIAGNOSIS — G20 Parkinson's disease: Secondary | ICD-10-CM | POA: Diagnosis not present

## 2019-12-14 DIAGNOSIS — L97319 Non-pressure chronic ulcer of right ankle with unspecified severity: Secondary | ICD-10-CM | POA: Diagnosis not present

## 2019-12-14 DIAGNOSIS — I110 Hypertensive heart disease with heart failure: Secondary | ICD-10-CM | POA: Diagnosis not present

## 2019-12-14 DIAGNOSIS — I872 Venous insufficiency (chronic) (peripheral): Secondary | ICD-10-CM | POA: Diagnosis not present

## 2019-12-14 DIAGNOSIS — R2689 Other abnormalities of gait and mobility: Secondary | ICD-10-CM | POA: Diagnosis not present

## 2019-12-14 DIAGNOSIS — I350 Nonrheumatic aortic (valve) stenosis: Secondary | ICD-10-CM | POA: Diagnosis not present

## 2019-12-14 DIAGNOSIS — M1990 Unspecified osteoarthritis, unspecified site: Secondary | ICD-10-CM | POA: Diagnosis not present

## 2019-12-14 DIAGNOSIS — Z7982 Long term (current) use of aspirin: Secondary | ICD-10-CM | POA: Diagnosis not present

## 2019-12-14 DIAGNOSIS — S51811D Laceration without foreign body of right forearm, subsequent encounter: Secondary | ICD-10-CM | POA: Diagnosis not present

## 2019-12-14 DIAGNOSIS — R1312 Dysphagia, oropharyngeal phase: Secondary | ICD-10-CM | POA: Diagnosis not present

## 2019-12-14 DIAGNOSIS — G609 Hereditary and idiopathic neuropathy, unspecified: Secondary | ICD-10-CM | POA: Diagnosis not present

## 2019-12-14 DIAGNOSIS — K219 Gastro-esophageal reflux disease without esophagitis: Secondary | ICD-10-CM | POA: Diagnosis not present

## 2019-12-14 DIAGNOSIS — F329 Major depressive disorder, single episode, unspecified: Secondary | ICD-10-CM | POA: Diagnosis not present

## 2019-12-14 DIAGNOSIS — J449 Chronic obstructive pulmonary disease, unspecified: Secondary | ICD-10-CM | POA: Diagnosis not present

## 2019-12-14 DIAGNOSIS — L97819 Non-pressure chronic ulcer of other part of right lower leg with unspecified severity: Secondary | ICD-10-CM | POA: Diagnosis not present

## 2019-12-14 DIAGNOSIS — W19XXXD Unspecified fall, subsequent encounter: Secondary | ICD-10-CM | POA: Diagnosis not present

## 2019-12-14 DIAGNOSIS — R32 Unspecified urinary incontinence: Secondary | ICD-10-CM | POA: Diagnosis not present

## 2019-12-14 DIAGNOSIS — E43 Unspecified severe protein-calorie malnutrition: Secondary | ICD-10-CM | POA: Diagnosis not present

## 2019-12-14 LAB — CBC WITH DIFFERENTIAL/PLATELET
Absolute Monocytes: 813 cells/uL (ref 200–950)
Basophils Absolute: 59 cells/uL (ref 0–200)
Basophils Relative: 0.6 %
Eosinophils Absolute: 1127 cells/uL — ABNORMAL HIGH (ref 15–500)
Eosinophils Relative: 11.5 %
HCT: 35.9 % (ref 35.0–45.0)
Hemoglobin: 10.7 g/dL — ABNORMAL LOW (ref 11.7–15.5)
Lymphs Abs: 2293 cells/uL (ref 850–3900)
MCH: 23.8 pg — ABNORMAL LOW (ref 27.0–33.0)
MCHC: 29.8 g/dL — ABNORMAL LOW (ref 32.0–36.0)
MCV: 79.8 fL — ABNORMAL LOW (ref 80.0–100.0)
MPV: 11.3 fL (ref 7.5–12.5)
Monocytes Relative: 8.3 %
Neutro Abs: 5508 cells/uL (ref 1500–7800)
Neutrophils Relative %: 56.2 %
Platelets: 340 10*3/uL (ref 140–400)
RBC: 4.5 10*6/uL (ref 3.80–5.10)
RDW: 14.9 % (ref 11.0–15.0)
Total Lymphocyte: 23.4 %
WBC: 9.8 10*3/uL (ref 3.8–10.8)

## 2019-12-14 LAB — LIPID PANEL
Cholesterol: 122 mg/dL (ref <200)
HDL: 39 mg/dL — ABNORMAL LOW (ref >=50)
LDL Cholesterol (Calc): 63 mg/dL (calc)
Non-HDL Cholesterol (Calc): 83 mg/dL (calc) (ref <130)
Total CHOL/HDL Ratio: 3.1 (calc) (ref <5.0)
Triglycerides: 113 mg/dL (ref <150)

## 2019-12-14 LAB — MAGNESIUM: Magnesium: 2 mg/dL (ref 1.5–2.5)

## 2019-12-14 LAB — COMPLETE METABOLIC PANEL WITH GFR
AG Ratio: 1.4 (calc) (ref 1.0–2.5)
ALT: 6 U/L (ref 6–29)
AST: 12 U/L (ref 10–35)
Albumin: 3.6 g/dL (ref 3.6–5.1)
Alkaline phosphatase (APISO): 60 U/L (ref 37–153)
BUN: 18 mg/dL (ref 7–25)
CO2: 27 mmol/L (ref 20–32)
Calcium: 9.2 mg/dL (ref 8.6–10.4)
Chloride: 108 mmol/L (ref 98–110)
Creat: 0.84 mg/dL (ref 0.60–0.88)
GFR, Est African American: 76 mL/min/{1.73_m2} (ref >=60)
GFR, Est Non African American: 65 mL/min/{1.73_m2} (ref >=60)
Globulin: 2.6 g/dL (calc) (ref 1.9–3.7)
Glucose, Bld: 88 mg/dL (ref 65–99)
Potassium: 3.9 mmol/L (ref 3.5–5.3)
Sodium: 143 mmol/L (ref 135–146)
Total Bilirubin: 0.7 mg/dL (ref 0.2–1.2)
Total Protein: 6.2 g/dL (ref 6.1–8.1)

## 2019-12-14 LAB — HEMOGLOBIN A1C
Hgb A1c MFr Bld: 5.4 % of total Hgb (ref <5.7)
Mean Plasma Glucose: 108 (calc)
eAG (mmol/L): 6 (calc)

## 2019-12-14 LAB — TSH: TSH: 1.09 mIU/L (ref 0.40–4.50)

## 2019-12-14 LAB — VITAMIN D 25 HYDROXY (VIT D DEFICIENCY, FRACTURES): Vit D, 25-Hydroxy: 40 ng/mL (ref 30–100)

## 2019-12-14 MED ORDER — FUROSEMIDE 20 MG PO TABS: ORAL_TABLET | ORAL | 1 refills | Status: AC

## 2019-12-15 DIAGNOSIS — L97319 Non-pressure chronic ulcer of right ankle with unspecified severity: Secondary | ICD-10-CM | POA: Diagnosis not present

## 2019-12-15 DIAGNOSIS — L97819 Non-pressure chronic ulcer of other part of right lower leg with unspecified severity: Secondary | ICD-10-CM | POA: Diagnosis not present

## 2019-12-15 DIAGNOSIS — W19XXXD Unspecified fall, subsequent encounter: Secondary | ICD-10-CM | POA: Diagnosis not present

## 2019-12-15 DIAGNOSIS — I872 Venous insufficiency (chronic) (peripheral): Secondary | ICD-10-CM | POA: Diagnosis not present

## 2019-12-15 DIAGNOSIS — S51811D Laceration without foreign body of right forearm, subsequent encounter: Secondary | ICD-10-CM | POA: Diagnosis not present

## 2019-12-15 DIAGNOSIS — S8011XD Contusion of right lower leg, subsequent encounter: Secondary | ICD-10-CM | POA: Diagnosis not present

## 2019-12-16 DIAGNOSIS — W19XXXD Unspecified fall, subsequent encounter: Secondary | ICD-10-CM | POA: Diagnosis not present

## 2019-12-16 DIAGNOSIS — S8011XD Contusion of right lower leg, subsequent encounter: Secondary | ICD-10-CM | POA: Diagnosis not present

## 2019-12-16 DIAGNOSIS — L97319 Non-pressure chronic ulcer of right ankle with unspecified severity: Secondary | ICD-10-CM | POA: Diagnosis not present

## 2019-12-16 DIAGNOSIS — I872 Venous insufficiency (chronic) (peripheral): Secondary | ICD-10-CM | POA: Diagnosis not present

## 2019-12-16 DIAGNOSIS — S51811D Laceration without foreign body of right forearm, subsequent encounter: Secondary | ICD-10-CM | POA: Diagnosis not present

## 2019-12-16 DIAGNOSIS — L97819 Non-pressure chronic ulcer of other part of right lower leg with unspecified severity: Secondary | ICD-10-CM | POA: Diagnosis not present

## 2019-12-18 DIAGNOSIS — S51811D Laceration without foreign body of right forearm, subsequent encounter: Secondary | ICD-10-CM | POA: Diagnosis not present

## 2019-12-18 DIAGNOSIS — L97319 Non-pressure chronic ulcer of right ankle with unspecified severity: Secondary | ICD-10-CM | POA: Diagnosis not present

## 2019-12-18 DIAGNOSIS — L97819 Non-pressure chronic ulcer of other part of right lower leg with unspecified severity: Secondary | ICD-10-CM | POA: Diagnosis not present

## 2019-12-18 DIAGNOSIS — I872 Venous insufficiency (chronic) (peripheral): Secondary | ICD-10-CM | POA: Diagnosis not present

## 2019-12-18 DIAGNOSIS — S8011XD Contusion of right lower leg, subsequent encounter: Secondary | ICD-10-CM | POA: Diagnosis not present

## 2019-12-18 DIAGNOSIS — W19XXXD Unspecified fall, subsequent encounter: Secondary | ICD-10-CM | POA: Diagnosis not present

## 2019-12-19 ENCOUNTER — Encounter: Payer: Self-pay | Admitting: Internal Medicine

## 2019-12-19 DIAGNOSIS — I872 Venous insufficiency (chronic) (peripheral): Secondary | ICD-10-CM | POA: Diagnosis not present

## 2019-12-19 DIAGNOSIS — L97319 Non-pressure chronic ulcer of right ankle with unspecified severity: Secondary | ICD-10-CM | POA: Diagnosis not present

## 2019-12-19 DIAGNOSIS — Z23 Encounter for immunization: Secondary | ICD-10-CM | POA: Diagnosis not present

## 2019-12-19 DIAGNOSIS — W19XXXD Unspecified fall, subsequent encounter: Secondary | ICD-10-CM | POA: Diagnosis not present

## 2019-12-19 DIAGNOSIS — L97819 Non-pressure chronic ulcer of other part of right lower leg with unspecified severity: Secondary | ICD-10-CM | POA: Diagnosis not present

## 2019-12-19 DIAGNOSIS — S51811D Laceration without foreign body of right forearm, subsequent encounter: Secondary | ICD-10-CM | POA: Diagnosis not present

## 2019-12-19 DIAGNOSIS — S8011XD Contusion of right lower leg, subsequent encounter: Secondary | ICD-10-CM | POA: Diagnosis not present

## 2019-12-19 NOTE — Progress Notes (Addendum)
FOLLOW UP  Addendum: Patient got worse, got urgent referral to skin surgery center for biopsy. Her daughter is also concerned with her comfort and care. This past visit we discussed palliative care but the patient and daughter would like to have a visit from hospice, will send in referral.  PT was stopped due to this.  Pending notes from derm.  Assessment:   Bullae-? Medication reaction/herpes zoster/SLE/paraneoplastic Will stop remeron, ? From recent keflex Patient is on prednisone 10mg  once a day, may have to increase Check labs- may need referral for biopsy if not improving The patient was advised to call immediately if she has any concerning symptoms in the interval. The patient voices understanding of current treatment options and is in agreement with the current care plan.The patient knows to call the clinic with any problems, questions or concerns or go to the ER if any further progression of symptoms.  -     acyclovir (ZOVIRAX) 200 MG/5ML suspension; Take 10 mLs (400 mg total) by mouth 5 (five) times daily for 9 days. -     CBC with Differential/Platelet -     COMPLETE METABOLIC PANEL WITH GFR -     ANA -     Anti-Smith antibody -     Serum protein electrophoresis with reflex  Decreased appetite -     megestrol (MEGACE) 400 MG/10ML suspension; Take 10 mLs (400 mg total) by mouth daily. Stop remeron.   Atherosclerosis of aorta (HCC) Control blood pressure, cholesterol, glucose, increase exercise.   Chronic diastolic CHF (congestive heart failure) (HCC) - continue lasix, monitor weight and kidney function  Chronic respiratory failure with hypoxia, on home O2 therapy ([place]) -     Ambulatory referral to [facility] -     Amb Referral to Palliative Care  - continue O2 as needed  Venous stasis ulcer of other part of right lower leg limited to breakdown of skin without varicose veins (HCC) - right leg without evidence of infection but + bullous and ulcers from venous  insufficiency and recent fall with hematoma - patient is benefiting from wound care and unna boots to prevent infection and worsening of the ulcers  Parkinson's plus syndrome (HCC) -      Palliative Care - would benefit from PT, patient has not been walking much due to her leg swelling/pain Now has deconditioning A goal of hers is to try to be mobile again, currently she is in a wheel chair, will set up at home PT since it is difficult for the patient to leave the house on her own.   Depression, major, in remission ([place]) Monitor  Severe protein-calorie malnutrition ([place]) -     Continue palliative care - add ensure   Over 40 minutes of exam, counseling, chart review and critical decision making was performed Future Appointments  Date Time Provider [place]  12/20/2019  3:45 PM Erlene Quan, PA-C [facility]  01/10/2020  2:30 PM Vicie Mutters, PA-C [facility] None  01/22/2020  3:30 PM Narda Amber K, DO [facility] None  2020/02/04 11:20 AM Carole Civil, MD [facility] None  06/17/2020  2:00 PM [name], MD [facility] None  12/23/2020  2:00 PM Vicie Mutters, PA-C [facility] None      Subjective:  Marie [name] is a 84 y.o. Caucasian female with hypertensive heart disease, COPD, parkinson's plus syndrome, oropharyngeal dysphagia, chronic respiratory failure with home O2 who presents for follow up for left leg pain and wounds.   Daughter and  husband with her today.  She has home health out to do unna boots and wound management with a nurse x last Thursday coming out twice a week.   Daughter states her leg is looking better but she is starting to have blisters at the top the of unna boot on the lateral right and right hip/buttocks. No burning, itching, or tenderness. No rash anywhere else, no rash in her mouth.   No fever, chills, warmth   She had cipro for UTI 2-3 weeks ago and finished a keflex dose for "itching" last visit. She also had remeron added  recently.  She has new bulla along right flank.  She is on prednisone once a day.   Colonoscopy 2012 Dr. Amedeo Plenty 2017- no family history of colon cancer Ct head 10/2019 CT chest 11/2016 MGM 11/2017  BP Readings from Last 3 Encounters:  12/20/19 110/74  12/13/19 118/80  12/06/19 102/60     The daughter is still managing medications and coordinate pill box weekly.  She has someone that comes out throught the [facility] program in [place], comes out 9-5pm.   She is very high risk for falls, uses a walker/has a wheelchair for long distances and daughter states she is not doing much walking due to the leg injury, her last fall was in Jan. Went to the Er 02/06 for leg pain, has blisters over her right ankle and leg, home health has came out and will do unna boots with wound care.    Saw Arther Abbott in ortho surgery 11/15/19 and 12/06/2019, had hematoma blisters over the right ankle.  Tramadol, meloxicam, robaxin and norco are as needed, has not been on in several weeks.   She has had multiple ER visits, 3 visits in just 2021.   On home O2 therapy - 2L at home as needed to maintain saturations above 88%. On trelegy and doing well.    Medication Review:  Current Outpatient Medications (Endocrine & Metabolic):  .  predniSONE (DELTASONE) 10 MG tablet, Take 1 tablet 3 x /day as directed for COPD  Current Outpatient Medications (Cardiovascular):  .  furosemide (LASIX) 20 MG tablet, Take 1 tab (20 mg daily); if weight trending up above 148 lb with swelling or shortness of breath increase lasix for 40 mg (2 tabs) daily for 3-5 days until swelling resolves  Current Outpatient Medications (Respiratory):  .  cetirizine (ZYRTEC) 10 MG tablet, Take 10 mg by mouth daily. .  TRELEGY ELLIPTA 100-62.5-25 MCG/INH AEPB, Inhale 1 puff into the lungs daily.   Current Outpatient Medications (Analgesics):  .  aspirin 81 MG tablet, Take 81 mg by mouth daily.  .  meloxicam (MOBIC) 15 MG tablet, Take one  tablet daily as needed for pain.  Take with food.  Avoid ibuprofen but ok to take tylenol. (Patient taking differently: Take 15 mg by mouth daily as needed for pain. Take with food.  Avoid ibuprofen but ok to take tylenol.)   Current Outpatient Medications (Other):  Marland Kitchen  ALPRAZolam (XANAX) 0.5 MG tablet, Take 1/2-1 tablet 2 - 3 x /day ONLY if needed for Anxiety Attack &  limit to 5 days /week to avoid Addiction & Dementia .  carbidopa-levodopa (SINEMET IR) 25-100 MG tablet, Take 2 tablet at 9am and 2 tablets at 3pm. (Patient taking differently: Take 2 tablets by mouth daily. ) .  Cholecalciferol (VITAMIN D) 2000 units CAPS, Take 2 capsules (4,000 Units total) by mouth daily. Marland Kitchen  gabapentin (NEURONTIN) 100 MG capsule, Take 1-2 capsules (  100-200 mg total) by mouth 3 (three) times daily as needed. (Patient taking differently: Take 100-200 mg by mouth 3 (three) times daily as needed (for itching). ) .  hydrOXYzine (ATARAX/VISTARIL) 25 MG tablet, Take 1 tablet (25 mg total) by mouth every 6 (six) hours as needed for itching. Use sparingly; may cause sedation. .  methocarbamol (ROBAXIN) 500 MG tablet, Take 1 tablet (500 mg total) by mouth every 6 (six) hours as needed for muscle spasms. .  mirtazapine (REMERON) 15 MG tablet, Take 1 tablet at Bedtime for Sleep & Appetite .  omeprazole (PRILOSEC) 40 MG capsule, Take 1 capsule Daily for Indigestion & Heartburn (Patient taking differently: Take 40 mg by mouth every morning. for Indigestion & Heartburn) .  ondansetron (ZOFRAN ODT) 8 MG disintegrating tablet, Take 1 tablet (8 mg total) by mouth every 8 (eight) hours as needed for nausea or vomiting. .  OXYGEN, Inhale 2 L into the lungs daily as needed (for shortness of breath). Setting is on 2 when needed  .  triamcinolone ointment (KENALOG) 0.1 %, Apply 1 application topically 2 (two) times daily.   Allergies  Allergen Reactions  . Demerol  [Meperidine Hcl] Other (See Comments)    Pt. Feels like she is  suffocating  . Ertapenem Hives    Caused whole body to turn red Other reaction(s): Redness Caused whole body to turn red  . Codeine Other (See Comments)    hallucinations  . Morphine And Related Other (See Comments)    hallucinations  . Other Other (See Comments)    Invan 7- pt became red all over  . Sulfa Antibiotics Rash    Current Problems (verified) Patient Active Problem List   Diagnosis Date Noted  . Asthmatic bronchitis 01/17/2018  . Chronic diastolic CHF (congestive heart failure) ([place]) 12/15/2017  . Anxiety 12/15/2017  . Subvalvular aortic stenosis determined by imaging 11/15/2017  . Problem with medical care compliance 11/11/2017  . Aortic atherosclerosis ([place]) 10/19/2017  . Dysphagia 09/28/2017  . Chronic respiratory failure with hypoxia, on home O2 therapy ([place]) 09/20/2017  . Hypertensive heart disease 03/29/2017  . Protein-calorie malnutrition, severe 03/17/2017  . Parkinson's plus syndrome ([place]) 03/04/2017  . Restrictive lung disease 02/24/2017  . LVH (left ventricular hypertrophy) 01/07/2017  . Depression, major, in remission ([place]) 09/22/2016  . At high risk for falls 10/16/2015  . Abnormal glucose 08/23/2014  . Vitamin D deficiency 08/23/2014  . Medication management 08/23/2014  . Unstable Gait 01/30/2014  . Incontinence   . Hyperlipidemia   . GERD (gastroesophageal reflux disease)   . Hereditary and idiopathic peripheral neuropathy   . Essential hypertension   . Tubulovillous adenoma of rectum 09/04/2011    Surgical History: reviewed and unchanged Family History: reviewed and unchanged Social History: reviewed and unchanged  Review of Systems  Constitutional: Positive for weight loss. Negative for chills, diaphoresis, fever and malaise/fatigue.  HENT: Negative for congestion, hearing loss, sore throat and tinnitus.   Eyes: Negative for blurred vision and double vision.  Respiratory: Positive for shortness of breath. Negative for cough and wheezing.    Cardiovascular: Positive for leg swelling. Negative for chest pain, palpitations, orthopnea, claudication and PND.  Gastrointestinal: Negative for abdominal pain, blood in stool, constipation, diarrhea, heartburn, melena, nausea and vomiting.  Genitourinary: Negative.   Musculoskeletal: Positive for falls, joint pain and myalgias.  Skin: Positive for rash.  Neurological: Negative for dizziness, tingling, sensory change, weakness and headaches.  Endo/Heme/Allergies: Negative for polydipsia.  Psychiatric/Behavioral: Negative.  Negative for depression  and memory loss. The patient is not nervous/anxious and does not have insomnia.   All other systems reviewed and are negative.    Objective:     Today's Vitals   12/20/19 1053  BP: 110/74  Temp: (!) 97.4 F (36.3 C)   There is no height or weight on file to calculate BMI.  General appearance: alert, no distress, WD/WN, frail female HEENT: normocephalic, sclerae anicteric, TMs pearly, nares patent, no discharge or erythema, pharynx normal Oral cavity: MMM, no lesions Neck: supple, no lymphadenopathy, no thyromegaly, no masses Heart: RRR, normal S1, S2, with systolic murmur better with inspiration  Lungs: CTA bilaterally,no wheezes, rhonchi, or rales, patient coughing frequently.  Abdomen: +bs, soft, non tender, non distended Musculoskeletal: nontender, no swelling, no obvious deformity Extremities: right leg with unna boot with two 1 cm bullous right anterior lateral leg above the unna boot and right flank with lattice rash and multiple bullous with serous discharge- see pictures.  Pulses: 2+ symmetric, upper and lower extremities Neurological: alert, oriented x 3, CN2-12 intact, strength symmetrical upper extremities and lower extremities except some decreased strength right leg. Patient in a wheel chair Psychiatric: normal affect, behavior normal, pleasant                                            Vicie Mutters,  PA-C   12/20/2019

## 2019-12-20 ENCOUNTER — Encounter: Payer: Self-pay | Admitting: Cardiology

## 2019-12-20 ENCOUNTER — Other Ambulatory Visit: Payer: Self-pay

## 2019-12-20 ENCOUNTER — Ambulatory Visit (INDEPENDENT_AMBULATORY_CARE_PROVIDER_SITE_OTHER): Payer: Medicare Other | Admitting: Physician Assistant

## 2019-12-20 ENCOUNTER — Telehealth (INDEPENDENT_AMBULATORY_CARE_PROVIDER_SITE_OTHER): Payer: Medicare Other | Admitting: Cardiology

## 2019-12-20 ENCOUNTER — Encounter: Payer: Self-pay | Admitting: Physician Assistant

## 2019-12-20 VITALS — BP 110/70 | Ht 64.0 in | Wt 135.0 lb

## 2019-12-20 VITALS — BP 110/74 | Temp 97.4°F

## 2019-12-20 DIAGNOSIS — R238 Other skin changes: Secondary | ICD-10-CM

## 2019-12-20 DIAGNOSIS — J9611 Chronic respiratory failure with hypoxia: Secondary | ICD-10-CM

## 2019-12-20 DIAGNOSIS — I5032 Chronic diastolic (congestive) heart failure: Secondary | ICD-10-CM

## 2019-12-20 DIAGNOSIS — G20C Parkinsonism, unspecified: Secondary | ICD-10-CM

## 2019-12-20 DIAGNOSIS — Q244 Congenital subaortic stenosis: Secondary | ICD-10-CM

## 2019-12-20 DIAGNOSIS — R630 Anorexia: Secondary | ICD-10-CM

## 2019-12-20 DIAGNOSIS — Z9981 Dependence on supplemental oxygen: Secondary | ICD-10-CM

## 2019-12-20 DIAGNOSIS — G232 Striatonigral degeneration: Secondary | ICD-10-CM

## 2019-12-20 DIAGNOSIS — E43 Unspecified severe protein-calorie malnutrition: Secondary | ICD-10-CM | POA: Diagnosis not present

## 2019-12-20 DIAGNOSIS — I700 Atherosclerosis of aorta: Secondary | ICD-10-CM

## 2019-12-20 DIAGNOSIS — F325 Major depressive disorder, single episode, in full remission: Secondary | ICD-10-CM | POA: Diagnosis not present

## 2019-12-20 DIAGNOSIS — I10 Essential (primary) hypertension: Secondary | ICD-10-CM

## 2019-12-20 MED ORDER — MEGESTROL ACETATE 400 MG/10ML PO SUSP: 400.0000 mg | Freq: Every day | ORAL | 0 refills | Status: AC

## 2019-12-20 MED ORDER — ACYCLOVIR 200 MG/5ML PO SUSP: 400.0000 mg | Freq: Every day | ORAL | 0 refills | Status: AC

## 2019-12-20 NOTE — Progress Notes (Signed)
Virtual Visit via Telephone Note   This visit type was conducted due to national recommendations for restrictions regarding the COVID-19 Pandemic (e.g. social distancing) in an effort to limit this patient's exposure and mitigate transmission in our community.  Due to her co-morbid illnesses, this patient is at least at moderate risk for complications without adequate follow up.  This format is felt to be most appropriate for this patient at this time.  The patient did not have access to video technology/had technical difficulties with video requiring transitioning to audio format only (telephone).  All issues noted in this document were discussed and addressed.  No physical exam could be performed with this format.  Please refer to the patient's chart for her  consent to telehealth for [facility].   Date:  12/20/2019   ID:  Marie [name], DOB 01-May-1936, MRN [id number]  Patient Location: Home Provider Location: Home  PCP:  [name], MD  Cardiologist:  Dr Harl Bowie Electrophysiologist:  None   Evaluation Performed:  Follow-Up Visit  Chief Complaint:  none  History of Present Illness:    Marie [name] is a 84 y.o. female with a history of subaortic obstruction and thickened left ventricle.  The patient is asymptomatic.  She has Parkinson's and is frail.  She lives at home with her husband.  She was contacted today for routine follow-up, we have not seen her since January 2019.  [name] was on the phone as well.  The patient has not had unusual chest pain or shortness of breath.  She does not have a history of syncope.  They had no concerns about her current cardiac condition.  She is not really on any cardiac medications.  The patient does not have symptoms concerning for COVID-19 infection (fever, chills, cough, or new shortness of breath).    Past Medical History:  Diagnosis Date  . Balance disorder 2008.     Falls a lot.  She can be standing and then leans too  far over to one side   . Bulging disc   . Chronic bronchitis ([place])    due to congestion at times, on prednisone and advair  . COPD (chronic obstructive pulmonary disease) ([place])   . COPD with acute exacerbation ([place]) 12/15/2017  . Depression    many years ago  . GERD (gastroesophageal reflux disease)   . Hearing loss    mild  . Hyperlipidemia   . Hypertension   . Iatrogenic adrenal insufficiency ([place])   . Incontinence    not indicated at this visit.  Marland Kitchen LVH (left ventricular hypertrophy) due to hypertensive disease    a. 02/2017: echo showing an EF of 65-70% with moderate LVH, hyperdynamic LV with subvalvular gradient of 70 mmHg, SAM, and mild MR  . Neuropathy    legs stay numb   . Osteoarthritis    hands,   . Osteoporosis   . Parkinson's disease ([place])   . Shortness of breath dyspnea   . Tubulovillous adenoma of rectum   . Wears dentures   . Wears glasses    Past Surgical History:  Procedure Laterality Date  . ABDOMINAL HYSTERECTOMY    . APPENDECTOMY    . CHONDROPLASTY  09/19/2014   Procedure: CHONDROPLASTY;  Surgeon: Alta Corning, MD;  Location: [facility];  Service: Orthopedics;;  . DILATION AND CURETTAGE OF UTERUS    . EXTERNAL FIXATION LEG  10/25/2012   Procedure: EXTERNAL FIXATION LEG;  Surgeon: Rozanna Box, MD;  Location: [facility];  Service: Orthopedics;  Laterality: Right;  . EYE SURGERY     bilateral cataract surgery and lens implant  . FINGER SURGERY     fusions and debridements for OA  . INCONTINENCE SURGERY     multiple procedures, not cured  . KNEE ARTHROSCOPY WITH LATERAL MENISECTOMY Right 09/19/2014   Procedure: KNEE ARTHROSCOPY WITH LATERAL MENISECTOMY;  Surgeon: Alta Corning, MD;  Location: [facility];  Service: Orthopedics;  Laterality: Right;  . KNEE ARTHROSCOPY WITH MEDIAL MENISECTOMY Right 09/19/2014   Procedure: RIGHT KNEE ARTHROSCOPY WITH MEDIAL AND LATERAL MENISECTOMIES. CHONDROPLASTY OF PATELLA-FEMORAL JOINT;  Surgeon:  Alta Corning, MD;  Location: [facility];  Service: Orthopedics;  Laterality: Right;  . RECTAL BIOPSY  09/21/2011   Procedure: BIOPSY RECTAL;  Surgeon: Merrie Roof, MD;  Location: [facility];  Service: General;  Laterality: N/A;  3-4 cm  . RECTAL SURGERY     by dr. Marlou Starks, removal of polyp  . TONSILLECTOMY       Current Meds  Medication Sig  . acyclovir (ZOVIRAX) 200 MG/5ML suspension Take 10 mLs (400 mg total) by mouth 5 (five) times daily for 9 days.  . ALPRAZolam (XANAX) 0.5 MG tablet Take 1/2-1 tablet 2 - 3 x /day ONLY if needed for Anxiety Attack &  limit to 5 days /week to avoid Addiction & Dementia  . aspirin 81 MG tablet Take 81 mg by mouth daily.   . carbidopa-levodopa (SINEMET IR) 25-100 MG tablet Take 2 tablets by mouth daily.  . cetirizine (ZYRTEC) 10 MG tablet Take 10 mg by mouth daily.  . Cholecalciferol (VITAMIN D) 2000 units CAPS Take 2 capsules (4,000 Units total) by mouth daily. (Patient taking differently: Take 5,000 Units by mouth daily. )  . furosemide (LASIX) 20 MG tablet Take 1 tab (20 mg daily); if weight trending up above 148 lb with swelling or shortness of breath increase lasix for 40 mg (2 tabs) daily for 3-5 days until swelling resolves  . hydrOXYzine (ATARAX/VISTARIL) 25 MG tablet Take 1 tablet (25 mg total) by mouth every 6 (six) hours as needed for itching. Use sparingly; may cause sedation.  . megestrol (MEGACE) 400 MG/10ML suspension Take 10 mLs (400 mg total) by mouth daily.  . meloxicam (MOBIC) 15 MG tablet Take one tablet daily as needed for pain.  Take with food.  Avoid ibuprofen but ok to take tylenol. (Patient taking differently: Take 15 mg by mouth daily as needed for pain. Take with food.  Avoid ibuprofen but ok to take tylenol.)  . omeprazole (PRILOSEC) 40 MG capsule Take 1 capsule Daily for Indigestion & Heartburn (Patient taking differently: Take 40 mg by mouth every morning. for Indigestion & Heartburn)  . ondansetron (ZOFRAN ODT) 8 MG  disintegrating tablet Take 1 tablet (8 mg total) by mouth every 8 (eight) hours as needed for nausea or vomiting.  . OXYGEN Inhale 2 L into the lungs daily as needed (for shortness of breath). Setting is on 2 when needed   . predniSONE (DELTASONE) 10 MG tablet Take 1 tablet 3 x /day as directed for COPD (Patient taking differently: Take 10 mg by mouth daily with breakfast. )  . TRELEGY ELLIPTA 100-62.5-25 MCG/INH AEPB Inhale 1 puff into the lungs daily.   Marland Kitchen triamcinolone ointment (KENALOG) 0.1 % Apply 1 application topically 2 (two) times daily.  . [DISCONTINUED] carbidopa-levodopa (SINEMET IR) 25-100 MG tablet Take 2 tablet at 9am and 2 tablets at 3pm. (  Patient taking differently: Take 2 tablets by mouth daily. )  . [DISCONTINUED] methocarbamol (ROBAXIN) 500 MG tablet Take 1 tablet (500 mg total) by mouth every 6 (six) hours as needed for muscle spasms.     Allergies:   Demerol  [meperidine hcl], Ertapenem, Codeine, Morphine and related, Other, and Sulfa antibiotics   Social History   Tobacco Use  . Smoking status: Never Smoker  . Smokeless tobacco: Never Used  Substance Use Topics  . Alcohol use: No  . Drug use: No     Family Hx: The patient's family history includes COPD in her mother; Cancer in her sister; Heart attack in her father; High blood pressure in her mother.  ROS:   Please see the history of present illness.    All other systems reviewed and are negative.   Prior CV studies:   The following studies were reviewed today: Echo 2018  Labs/Other Tests and Data Reviewed:    EKG:  An ECG dated 06/05/2019 was personally reviewed today and demonstrated:  NSR, LAD, twi 1, AVL, PACs  Recent Labs: 03/20/2019: B Natriuretic Peptide 273.0 12/13/2019: ALT 6; BUN 18; Creat 0.84; Hemoglobin 10.7; Magnesium 2.0; Platelets 340; Potassium 3.9; Sodium 143; TSH 1.09   Recent Lipid Panel Lab Results  Component Value Date/Time   CHOL 122 12/13/2019 03:14 PM   TRIG 113 12/13/2019 03:14  PM   HDL 39 (L) 12/13/2019 03:14 PM   CHOLHDL 3.1 12/13/2019 03:14 PM   LDLCALC 63 12/13/2019 03:14 PM    Wt Readings from Last 3 Encounters:  12/20/19 135 lb (61.2 kg)  12/13/19 135 lb (61.2 kg)  11/25/19 140 lb (63.5 kg)     Objective:    Vital Signs:  BP 110/70 Comment: taken at doc appt today  Ht 5\' 4"  (1.626 m)   Wt 135 lb (61.2 kg)   BMI 23.17 kg/m    VITAL SIGNS:  reviewed  ASSESSMENT & PLAN:    LVOT- Asymptomatic  Parkinson's- Frail- living at home, chronic O2   Plan:  No change in Rx- f/u in a year or PRN   COVID-19 Education: The signs and symptoms of COVID-19 were discussed with the patient and how to seek care for testing (follow up with PCP or arrange E-visit).  The importance of social distancing was discussed today.  Time:   Today, I have spent 10 minutes with the patient with telehealth technology discussing the above problems.     Medication Adjustments/Labs and Tests Ordered: Current medicines are reviewed at length with the patient today.  Concerns regarding medicines are outlined above.   Tests Ordered: No orders of the defined types were placed in this encounter.   Medication Changes: No orders of the defined types were placed in this encounter.   Follow Up:  Either In Person or Virtual one year Dr Harl Bowie  Signed, Kerin Ransom, PA-C  12/20/2019 4:05 PM    [place]

## 2019-12-20 NOTE — Patient Instructions (Addendum)
Cut back on the remeron from the 2 a day to 1 a day for 5-7 days and then stop Will add on megace after the first week for appetite after you stop the remeron.   Will get labs Will treat as shingles with acyclovir 5 x a day for 10   Suggest follow up with derm, may need biopsy if not better  Continue the prednisone once a day   Bullous Pemphigoid  Bullous pemphigoid is a skin disease that causes blisters to form. It ranges in severity and can last for a long time. The disease can come back months or years after it goes away. Bullous pemphigoid is an autoimmune disease. This means that the body's own disease-fighting system (immune system) attacks the body. What are the causes? The cause of this condition is not known. Certain medicines and conditions, such as psoriasis, lichen planus, and multiple sclerosis, have been associated with bullous pemphigoid. What increases the risk? This condition is more likely to develop in people over the age of 41. What are the signs or symptoms? This condition causes blisters to form on the skin. In mild cases, only a few small blisters form. In severe cases, many large blisters form in several areas of the body. The most common places blisters form are the groin, armpits, torso, thighs, and forearms. Some people develop blisters in the mouth. The blisters may break open, forming ulcers. Other symptoms of this condition include:  Redness.  Irritation.  Itchiness.  Bleeding gums.  Difficulty eating.  Cough.  Pain with swallowing.  Nosebleeds. How is this diagnosed? This condition may be diagnosed with a physical exam and blood tests. You may have a skin sample removed for testing (skin biopsy) to confirm diagnosis. You may work with a health care provider who specializes in skin care (dermatologist). How is this treated? This condition may be managed with medicines, such as:  Antibiotic medicines to prevent infection.  Steroid medicines  to reduce inflammation. These may be applied to the skin (topical), taken by mouth (oral), or given as injections.  Medicines that reduce the activity of (suppress) the immune system. If your mouth or lips are affected, your health care provider may recommend changing your diet while you are having symptoms. If your symptoms are severe, you may need to be treated at the hospital. Treatment at the hospital may include:  Treatment for ulcers.  IV medicines.  IV nutrition, if your mouth or lips are affected. Follow these instructions at home:  Take over-the-counter and prescription medicines only as told by your health care provider.  Keep your skin clean.  Do not scratch, pop, or drain your blisters. Doing so can lead to infection.  Cover blisters or ulcers with clean bandages until they heal. Change the bandages once a day or as often as recommended by your health care provider.  If you have blisters or ulcers in your mouth or lips: ? Try only drinking liquids or only eating soft foods to help relieve discomfort while eating. ? Avoid drinking very hot liquids.  Keep all follow-up visits as told by your health care provider. This is important. Contact a health care provider if you:  Have pain or itchiness that does not get better with medicine.  Develop redness, swelling, or pain that spreads away from your blisters or ulcers.  Have pus coming from a blister or ulcer. Get help right away if you:  Have a fever.  Become confused.  Have severe pain.  Feel  unusually tired or weak.  Cannot eat or drink because of blisters, ulcers, or pain in your lips or mouth.  Cannot care for yourself because of blisters, ulcers, or pain in your hands or in the soles of your feet. Summary  Bullous pemphigoid is a skin disease that causes blisters to form.  This an autoimmune disease, which means that the body's own disease-fighting system (immune system) attacks the body.  This  condition can be treated with medicines. In some cases, you may need treatment at a hospital.  Do not scratch, pop, or drain your blisters. This information is not intended to replace advice given to you by your health care provider. Make sure you discuss any questions you have with your health care provider. Document Revised: 09/17/2017 Document Reviewed: 08/11/2017 Elsevier Patient Education  2020 [employer].

## 2019-12-20 NOTE — Patient Instructions (Signed)
Medication Instructions:  Your physician recommends that you continue on your current medications as directed. Please refer to the Current Medication list given to you today.  *If you need a refill on your cardiac medications before your next appointment, please call your pharmacy*   Lab Work: NONE  Testing/Procedures: NONE  Follow-Up: At [facility], you and your health needs are our priority.  As part of our continuing mission to provide you with exceptional heart care, we have created designated Provider Care Teams.  These Care Teams include your primary Cardiologist (physician) and Advanced Practice Providers (APPs -  Physician Assistants and Nurse Practitioners) who all work together to provide you with the care you need, when you need it.  We recommend signing up for the patient portal called "MyChart".  Sign up information is provided on this After Visit Summary.  MyChart is used to connect with patients for Virtual Visits (Telemedicine).  Patients are able to view lab/test results, encounter notes, upcoming appointments, etc.  Non-urgent messages can be sent to your provider as well.   To learn more about what you can do with MyChart, go to [email or web address].    Your next appointment:   12 month(s)  The format for your next appointment:   In Person  Provider:   Carlyle Dolly, MD

## 2019-12-21 DIAGNOSIS — I872 Venous insufficiency (chronic) (peripheral): Secondary | ICD-10-CM | POA: Diagnosis not present

## 2019-12-21 DIAGNOSIS — W19XXXD Unspecified fall, subsequent encounter: Secondary | ICD-10-CM | POA: Diagnosis not present

## 2019-12-21 DIAGNOSIS — S8011XD Contusion of right lower leg, subsequent encounter: Secondary | ICD-10-CM | POA: Diagnosis not present

## 2019-12-21 DIAGNOSIS — S51811D Laceration without foreign body of right forearm, subsequent encounter: Secondary | ICD-10-CM | POA: Diagnosis not present

## 2019-12-21 DIAGNOSIS — L97819 Non-pressure chronic ulcer of other part of right lower leg with unspecified severity: Secondary | ICD-10-CM | POA: Diagnosis not present

## 2019-12-21 DIAGNOSIS — L97319 Non-pressure chronic ulcer of right ankle with unspecified severity: Secondary | ICD-10-CM | POA: Diagnosis not present

## 2019-12-22 LAB — CBC WITH DIFFERENTIAL/PLATELET
Absolute Monocytes: 786 cells/uL (ref 200–950)
Basophils Absolute: 78 cells/uL (ref 0–200)
Basophils Relative: 0.8 %
Eosinophils Absolute: 2386 cells/uL — ABNORMAL HIGH (ref 15–500)
Eosinophils Relative: 24.6 %
HCT: 38.8 % (ref 35.0–45.0)
Hemoglobin: 11.4 g/dL — ABNORMAL LOW (ref 11.7–15.5)
Lymphs Abs: 2008 cells/uL (ref 850–3900)
MCH: 23.5 pg — ABNORMAL LOW (ref 27.0–33.0)
MCHC: 29.4 g/dL — ABNORMAL LOW (ref 32.0–36.0)
MCV: 79.8 fL — ABNORMAL LOW (ref 80.0–100.0)
MPV: 10.9 fL (ref 7.5–12.5)
Monocytes Relative: 8.1 %
Neutro Abs: 4443 cells/uL (ref 1500–7800)
Neutrophils Relative %: 45.8 %
Platelets: 433 10*3/uL — ABNORMAL HIGH (ref 140–400)
RBC: 4.86 10*6/uL (ref 3.80–5.10)
RDW: 14.8 % (ref 11.0–15.0)
Total Lymphocyte: 20.7 %
WBC: 9.7 10*3/uL (ref 3.8–10.8)

## 2019-12-22 LAB — COMPLETE METABOLIC PANEL WITH GFR
AG Ratio: 1.4 (calc) (ref 1.0–2.5)
ALT: 10 U/L (ref 6–29)
AST: 14 U/L (ref 10–35)
Albumin: 3.6 g/dL (ref 3.6–5.1)
Alkaline phosphatase (APISO): 58 U/L (ref 37–153)
BUN: 19 mg/dL (ref 7–25)
CO2: 27 mmol/L (ref 20–32)
Calcium: 8.9 mg/dL (ref 8.6–10.4)
Chloride: 108 mmol/L (ref 98–110)
Creat: 0.79 mg/dL (ref 0.60–0.88)
GFR, Est African American: 81 mL/min/{1.73_m2} (ref >=60)
GFR, Est Non African American: 70 mL/min/{1.73_m2} (ref >=60)
Globulin: 2.6 g/dL (calc) (ref 1.9–3.7)
Glucose, Bld: 90 mg/dL (ref 65–99)
Potassium: 4 mmol/L (ref 3.5–5.3)
Sodium: 144 mmol/L (ref 135–146)
Total Bilirubin: 0.6 mg/dL (ref 0.2–1.2)
Total Protein: 6.2 g/dL (ref 6.1–8.1)

## 2019-12-22 LAB — PROTEIN ELECTROPHORESIS, SERUM, WITH REFLEX
Albumin ELP: 3.3 g/dL — ABNORMAL LOW (ref 3.8–4.8)
Alpha 1: 0.3 g/dL (ref 0.2–0.3)
Alpha 2: 0.6 g/dL (ref 0.5–0.9)
Beta 2: 0.4 g/dL (ref 0.2–0.5)
Beta Globulin: 0.5 g/dL (ref 0.4–0.6)
Gamma Globulin: 1 g/dL (ref 0.8–1.7)
Total Protein: 6.1 g/dL (ref 6.1–8.1)

## 2019-12-22 LAB — ANA: Anti Nuclear Antibody (ANA): NEGATIVE

## 2019-12-22 LAB — ANTI-SMITH ANTIBODY: ENA SM Ab Ser-aCnc: <1 AI

## 2019-12-24 DIAGNOSIS — S8011XD Contusion of right lower leg, subsequent encounter: Secondary | ICD-10-CM | POA: Diagnosis not present

## 2019-12-24 DIAGNOSIS — I872 Venous insufficiency (chronic) (peripheral): Secondary | ICD-10-CM | POA: Diagnosis not present

## 2019-12-24 DIAGNOSIS — W19XXXD Unspecified fall, subsequent encounter: Secondary | ICD-10-CM | POA: Diagnosis not present

## 2019-12-24 DIAGNOSIS — L97819 Non-pressure chronic ulcer of other part of right lower leg with unspecified severity: Secondary | ICD-10-CM | POA: Diagnosis not present

## 2019-12-24 DIAGNOSIS — L97319 Non-pressure chronic ulcer of right ankle with unspecified severity: Secondary | ICD-10-CM | POA: Diagnosis not present

## 2019-12-24 DIAGNOSIS — S51811D Laceration without foreign body of right forearm, subsequent encounter: Secondary | ICD-10-CM | POA: Diagnosis not present

## 2019-12-25 DIAGNOSIS — B351 Tinea unguium: Secondary | ICD-10-CM | POA: Diagnosis not present

## 2019-12-25 DIAGNOSIS — I739 Peripheral vascular disease, unspecified: Secondary | ICD-10-CM | POA: Diagnosis not present

## 2019-12-26 ENCOUNTER — Other Ambulatory Visit: Payer: Self-pay

## 2019-12-26 ENCOUNTER — Other Ambulatory Visit: Payer: Self-pay | Admitting: Adult Health

## 2019-12-26 ENCOUNTER — Encounter (HOSPITAL_COMMUNITY): Payer: Self-pay

## 2019-12-26 ENCOUNTER — Telehealth: Payer: Self-pay | Admitting: Physician Assistant

## 2019-12-26 ENCOUNTER — Emergency Department (HOSPITAL_COMMUNITY)
Admission: EM | Admit: 2019-12-26 | Discharge: 2019-12-26 | Disposition: A | Discharge status: Home / Self Care | Payer: Medicare Other | Attending: Emergency Medicine | Admitting: Emergency Medicine

## 2019-12-26 DIAGNOSIS — Z743 Need for continuous supervision: Secondary | ICD-10-CM | POA: Diagnosis not present

## 2019-12-26 DIAGNOSIS — Z7982 Long term (current) use of aspirin: Secondary | ICD-10-CM | POA: Insufficient documentation

## 2019-12-26 DIAGNOSIS — L299 Pruritus, unspecified: Secondary | ICD-10-CM | POA: Diagnosis not present

## 2019-12-26 DIAGNOSIS — R21 Rash and other nonspecific skin eruption: Secondary | ICD-10-CM | POA: Diagnosis not present

## 2019-12-26 DIAGNOSIS — S8011XD Contusion of right lower leg, subsequent encounter: Secondary | ICD-10-CM | POA: Diagnosis not present

## 2019-12-26 DIAGNOSIS — S51811D Laceration without foreign body of right forearm, subsequent encounter: Secondary | ICD-10-CM | POA: Diagnosis not present

## 2019-12-26 DIAGNOSIS — I110 Hypertensive heart disease with heart failure: Secondary | ICD-10-CM | POA: Insufficient documentation

## 2019-12-26 DIAGNOSIS — L97319 Non-pressure chronic ulcer of right ankle with unspecified severity: Secondary | ICD-10-CM | POA: Diagnosis not present

## 2019-12-26 DIAGNOSIS — Z79899 Other long term (current) drug therapy: Secondary | ICD-10-CM | POA: Diagnosis not present

## 2019-12-26 DIAGNOSIS — R0689 Other abnormalities of breathing: Secondary | ICD-10-CM | POA: Diagnosis not present

## 2019-12-26 DIAGNOSIS — W19XXXD Unspecified fall, subsequent encounter: Secondary | ICD-10-CM | POA: Diagnosis not present

## 2019-12-26 DIAGNOSIS — L97819 Non-pressure chronic ulcer of other part of right lower leg with unspecified severity: Secondary | ICD-10-CM | POA: Diagnosis not present

## 2019-12-26 DIAGNOSIS — J449 Chronic obstructive pulmonary disease, unspecified: Secondary | ICD-10-CM | POA: Insufficient documentation

## 2019-12-26 DIAGNOSIS — I872 Venous insufficiency (chronic) (peripheral): Secondary | ICD-10-CM | POA: Diagnosis not present

## 2019-12-26 DIAGNOSIS — I5032 Chronic diastolic (congestive) heart failure: Secondary | ICD-10-CM | POA: Diagnosis not present

## 2019-12-26 DIAGNOSIS — G20 Parkinson's disease: Secondary | ICD-10-CM | POA: Diagnosis not present

## 2019-12-26 DIAGNOSIS — R52 Pain, unspecified: Secondary | ICD-10-CM | POA: Diagnosis not present

## 2019-12-26 MED ORDER — ALPRAZOLAM 0.5 MG PO TABS
0.5000 mg | ORAL_TABLET | Freq: Once | ORAL | Status: AC
  Administered 2019-12-26: 0.5 mg via ORAL
  Filled 2019-12-26: qty 1

## 2019-12-26 NOTE — ED Triage Notes (Signed)
EMS reports pt has had blistery rash to r side of body x 3 days.  EMS says pt had a test for shingles and was told it was negative but was prescribed acyclovir.

## 2019-12-26 NOTE — Discharge Instructions (Addendum)
You were given 0.5 mg of alprazolam in the emergency room tonight. If you need additional medication before bed, recommend taking your gabapentin. If you wake up at night still itching, take your Atarax as prescribed. Follow-up with your skin specialist tomorrow, recommend calling the office at 8 AM to confirm appointment.

## 2019-12-26 NOTE — ED Provider Notes (Signed)
[facility] EMERGENCY DEPARTMENT Provider Note   CSN: [id number] Arrival date & time: 12/26/19  1659     History Chief Complaint  Patient presents with  . Rash    Marie [name] is a 84 y.o. female.  84 year old female brought in by EMS from home for rash.  Patient's daughter is at bedside, provides history.  States patient had a fall in January resulting in hematoma to her right lower leg, later developed blisters around her knee and ankle area which were thought to be secondary to the swelling in her leg.  Patient's home health nurse was in the house about a week ago when she noticed blisters on the patient's back, patient was seen at the PCP at that time who did labs, thought the blistering rash may be secondary to starting Remeron in February, discontinue the Remeron and switch to Megace.  Patient is has since developed new blisters to her medial knees as well as elbow areas, continues to have blisters on her buttocks and right flank.  Patient denies any pain however family reports that she is very uncomfortable, she is itching despite being on Vistaril and gabapentin as well as applying an antiitch gel.  Patient is scheduled to be seen at the skin surgery Center tomorrow morning at 930 however family called PCP and advised on their bring her to the emergency room due to her discomfort and was told that her appointment would be canceled.          Past Medical History:  Diagnosis Date  . Balance disorder 2008.     Falls a lot.  She can be standing and then leans too far over to one side   . Bulging disc   . Chronic bronchitis ([place])    due to congestion at times, on prednisone and advair  . COPD (chronic obstructive pulmonary disease) ([place])   . COPD with acute exacerbation ([place]) 12/15/2017  . Depression    many years ago  . GERD (gastroesophageal reflux disease)   . Hearing loss    mild  . Hyperlipidemia   . Hypertension   . Iatrogenic adrenal insufficiency ([place])   .  Incontinence    not indicated at this visit.  Marland Kitchen LVH (left ventricular hypertrophy) due to hypertensive disease    a. 02/2017: echo showing an EF of 65-70% with moderate LVH, hyperdynamic LV with subvalvular gradient of 70 mmHg, SAM, and mild MR  . Neuropathy    legs stay numb   . Osteoarthritis    hands,   . Osteoporosis   . Parkinson's disease ([place])   . Shortness of breath dyspnea   . Tubulovillous adenoma of rectum   . Wears dentures   . Wears glasses     Patient Active Problem List   Diagnosis Date Noted  . Asthmatic bronchitis 01/17/2018  . Chronic diastolic CHF (congestive heart failure) ([place]) 12/15/2017  . Anxiety 12/15/2017  . Subvalvular aortic stenosis determined by imaging 11/15/2017  . Problem with medical care compliance 11/11/2017  . Aortic atherosclerosis ([place]) 10/19/2017  . Dysphagia 09/28/2017  . Chronic respiratory failure with hypoxia, on home O2 therapy ([place]) 09/20/2017  . Hypertensive heart disease 03/29/2017  . Protein-calorie malnutrition, severe 03/17/2017  . 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 Restrictive lung disease 02/24/2017  . LVH (left ventricular hypertrophy) 01/07/2017  . Depression, major, in remission ([place]) 09/22/2016  . At high risk for falls 10/16/2015  . Abnormal glucose 08/23/2014  . Vitamin D deficiency 08/23/2014  .  Medication management 08/23/2014  . Unstable Gait 01/30/2014  . Incontinence   . Hyperlipidemia   . GERD (gastroesophageal reflux disease)   . Hereditary and idiopathic peripheral neuropathy   . Essential hypertension   . Tubulovillous adenoma of rectum 09/04/2011    Past Surgical History:  Procedure Laterality Date  . ABDOMINAL HYSTERECTOMY    . APPENDECTOMY    . CHONDROPLASTY  09/19/2014   Procedure: CHONDROPLASTY;  Surgeon: Alta Corning, MD;  Location: [facility];  Service: Orthopedics;;  . DILATION AND CURETTAGE OF UTERUS    . EXTERNAL FIXATION LEG  10/25/2012   Procedure: EXTERNAL  FIXATION LEG;  Surgeon: Rozanna Box, MD;  Location: [facility];  Service: Orthopedics;  Laterality: Right;  . EYE SURGERY     bilateral cataract surgery and lens implant  . FINGER SURGERY     fusions and debridements for OA  . INCONTINENCE SURGERY     multiple procedures, not cured  . KNEE ARTHROSCOPY WITH LATERAL MENISECTOMY Right 09/19/2014   Procedure: KNEE ARTHROSCOPY WITH LATERAL MENISECTOMY;  Surgeon: Alta Corning, MD;  Location: [facility];  Service: Orthopedics;  Laterality: Right;  . KNEE ARTHROSCOPY WITH MEDIAL MENISECTOMY Right 09/19/2014   Procedure: RIGHT KNEE ARTHROSCOPY WITH MEDIAL AND LATERAL MENISECTOMIES. CHONDROPLASTY OF PATELLA-FEMORAL JOINT;  Surgeon: Alta Corning, MD;  Location: [facility];  Service: Orthopedics;  Laterality: Right;  . RECTAL BIOPSY  09/21/2011   Procedure: BIOPSY RECTAL;  Surgeon: Merrie Roof, MD;  Location: [facility];  Service: General;  Laterality: N/A;  3-4 cm  . RECTAL SURGERY     by dr. Marlou Starks, removal of polyp  . TONSILLECTOMY       OB History   No obstetric history on file.     Family History  Problem Relation Age of Onset  . Cancer Sister        pt unaware of what kind  . High blood pressure Mother   . COPD Mother   . Heart attack Father     Social History   Tobacco Use  . Smoking status: Never Smoker  . Smokeless tobacco: Never Used  Substance Use Topics  . Alcohol use: No  . Drug use: No    Home Medications Prior to Admission medications   Medication Sig Start Date End Date Taking? Authorizing Provider  acyclovir (ZOVIRAX) 200 MG/5ML suspension Take 10 mLs (400 mg total) by mouth 5 (five) times daily for 9 days. 12/20/19 12/29/19  Vicie Mutters, PA-C  ALPRAZolam Duanne Moron) 0.5 MG tablet Take 1/2-1 tablet 2 - 3 x /day ONLY if needed for Anxiety Attack &  limit to 5 days /week to avoid Addiction & Dementia 12/13/19   [name], MD  aspirin 81 MG tablet Take 81 mg by mouth daily.     [provider]  carbidopa-levodopa (SINEMET IR) 25-100 MG tablet Take 2 tablets by mouth daily.    [provider]  cetirizine (ZYRTEC) 10 MG tablet Take 10 mg by mouth daily.    [provider]  Cholecalciferol (VITAMIN D) 2000 units CAPS Take 2 capsules (4,000 Units total) by mouth daily. Patient taking differently: Take 5,000 Units by mouth daily.  08/16/18   Liane Comber, NP  furosemide (LASIX) 20 MG tablet Take 1 tab (20 mg daily); if weight trending up above 148 lb with swelling or shortness of breath increase lasix for 40 mg (2 tabs) daily for 3-5 days until swelling resolves 12/14/19  Vicie Mutters, PA-C  hydrOXYzine (ATARAX/VISTARIL) 25 MG tablet Take 1 tablet (25 mg total) by mouth every 6 (six) hours as needed for itching. Use sparingly; may cause sedation. 12/08/19   Liane Comber, NP  megestrol (MEGACE) 400 MG/10ML suspension Take 10 mLs (400 mg total) by mouth daily. 12/20/19 01/19/20  Vicie Mutters, PA-C  meloxicam (MOBIC) 15 MG tablet Take one tablet daily as needed for pain.  Take with food.  Avoid ibuprofen but ok to take tylenol. Patient taking differently: Take 15 mg by mouth daily as needed for pain. Take with food.  Avoid ibuprofen but ok to take tylenol. 07/18/19   Garnet Sierras, NP  omeprazole (PRILOSEC) 40 MG capsule Take 1 capsule Daily for Indigestion & Heartburn Patient taking differently: Take 40 mg by mouth every morning. for Indigestion & Heartburn 07/02/19   [name], MD  ondansetron (ZOFRAN ODT) 8 MG disintegrating tablet Take 1 tablet (8 mg total) by mouth every 8 (eight) hours as needed for nausea or vomiting. 11/12/19   Dorie Rank, MD  OXYGEN Inhale 2 L into the lungs daily as needed (for shortness of breath). Setting is on 2 when needed     [provider]  predniSONE (DELTASONE) 10 MG tablet Take 1 tablet 3 x /day as directed for COPD Patient taking differently: Take 10 mg by mouth daily with breakfast.  11/19/19   [name], MD  TRELEGY ELLIPTA 100-62.5-25 MCG/INH AEPB Inhale 1 puff into the lungs daily.  05/04/18   [provider]  triamcinolone ointment (KENALOG) 0.1 % Apply 1 application topically 2 (two) times daily. 10/30/19   Liane Comber, NP    Allergies    Demerol  [meperidine hcl], Ertapenem, Codeine, Morphine and related, Other, and Sulfa antibiotics  Review of Systems   Review of Systems  Constitutional: Negative for fever.  Respiratory: Negative for shortness of breath.   Cardiovascular: Negative for chest pain.  Gastrointestinal: Negative for abdominal pain, nausea and vomiting.  Skin: Positive for rash.    Physical Exam Updated Vital Signs BP 126/81   Pulse 73   Temp (!) 97.5 F (36.4 C) (Oral)   Resp 20   Ht 5\' 4"  (1.626 m)   Wt 61.2 kg   BMI 23.17 kg/m   Physical Exam Vitals and nursing note reviewed.  Constitutional:      General: She is not in acute distress.    Appearance: She is well-developed. She is not diaphoretic.  HENT:     Head: Normocephalic and atraumatic.  Pulmonary:     Effort: Pulmonary effort is normal.  Abdominal:     Palpations: Abdomen is soft.     Tenderness: There is no abdominal tenderness.  Skin:    Findings: Rash present.     Comments: Bullous rash to right flank, smaller lesions noted to medial knees bilaterally as well as left elbow.  Neurological:     Mental Status: She is alert and oriented to person, place, and time.  Psychiatric:        Behavior: Behavior normal.     ED Results / Procedures / Treatments   Labs (all labs ordered are listed, but only abnormal results are displayed) Labs Reviewed - No data to display  EKG None  Radiology No results found.  Procedures Procedures (including critical care time)  Medications Ordered in ED Medications  ALPRAZolam Duanne Moron) tablet 0.5 mg (0.5 mg Oral Given 12/26/19 1931)    ED Course  I have reviewed the triage vital signs  and the nursing notes.  Pertinent labs &  imaging results that were available during my care of the patient were reviewed by me and considered in my medical decision making (see chart for details).  Clinical Course as of Dec 26 1930  Tue Dec 25, 5960  1848 84 year old female brought in by EMS from home, daughter at bedside, report of blistery rash, progressively worsening.  Patient has full to right flank, medial knees bilaterally, left elbow area, buttocks.  No signs of secondary infection at this time, pictures in chart, reviewed and compared with pictures from 6 days ago at PCP office.  Labs from PCP office reviewed, ANA and anti-Smith negative, CBC with elevated eosinophils.  Rash started after patient had a fall with bruising and swelling to the right leg, became worse after starting Remeron which she has since discontinued.  Patient was scheduled to follow-up with the skin surgery center tomorrow at 9:30 AM, this appointment was possibly canceled when patient's daughter called PCP office at 6 PM tonight advised and they are coming to the emergency room.  Office is closed at this time, I am unable to reach the office however did leave a message on the urgent message line requesting patient's appointment not be canceled, also advised patient's daughter to call the office at 8 AM when they open confirming appointment for 930.  Suspect bullous pemphigoid however will hold off on starting steroids as she may need biopsy in the office tomorrow and I am unsure if this will impact her results.  Patient normally takes 0.25 mg of alprazolam, will give 0.5 mg prior to discharge to see if this will help her discomfort, she can also take gabapentin when she arrives home if needed.  Family is comfortable with discharge instructions and plan.   [LM]    Clinical Course User Index [LM] Roque Lias   MDM Rules/Calculators/A&P                      Final Clinical Impression(s) / ED Diagnoses Final diagnoses:  Rash    Rx / DC Orders ED  Discharge Orders    None       Roque Lias 12/26/19 1932    Long, Wonda Old[facility], MD 12/28/19 1927

## 2019-12-26 NOTE — Telephone Encounter (Signed)
daughter called to advise patient is not doing well, she is going to take patient by EMS to ER. Patient is out of sorts, in pain, and stating, " I do not wanting to continue like this". Daughter requests Hospice. Daughter requests Physical Therapy be discontinued, patient has Physical Therapist with [facility], but  she can not tolerate the treatment. Please discontinue Physical Therapy.     Daughter has requested Hospice ordered for patient.

## 2019-12-27 DIAGNOSIS — L989 Disorder of the skin and subcutaneous tissue, unspecified: Secondary | ICD-10-CM | POA: Diagnosis not present

## 2019-12-27 DIAGNOSIS — L120 Bullous pemphigoid: Secondary | ICD-10-CM | POA: Diagnosis not present

## 2019-12-27 DIAGNOSIS — D485 Neoplasm of uncertain behavior of skin: Secondary | ICD-10-CM | POA: Diagnosis not present

## 2019-12-28 DIAGNOSIS — E43 Unspecified severe protein-calorie malnutrition: Secondary | ICD-10-CM | POA: Diagnosis not present

## 2019-12-28 DIAGNOSIS — I872 Venous insufficiency (chronic) (peripheral): Secondary | ICD-10-CM | POA: Diagnosis not present

## 2019-12-28 DIAGNOSIS — R32 Unspecified urinary incontinence: Secondary | ICD-10-CM | POA: Diagnosis not present

## 2019-12-28 DIAGNOSIS — J984 Other disorders of lung: Secondary | ICD-10-CM | POA: Diagnosis not present

## 2019-12-28 DIAGNOSIS — L97811 Non-pressure chronic ulcer of other part of right lower leg limited to breakdown of skin: Secondary | ICD-10-CM | POA: Diagnosis not present

## 2019-12-28 DIAGNOSIS — Z9981 Dependence on supplemental oxygen: Secondary | ICD-10-CM | POA: Diagnosis not present

## 2019-12-28 DIAGNOSIS — G232 Striatonigral degeneration: Secondary | ICD-10-CM | POA: Diagnosis not present

## 2019-12-28 DIAGNOSIS — J9611 Chronic respiratory failure with hypoxia: Secondary | ICD-10-CM | POA: Diagnosis not present

## 2019-12-28 NOTE — Addendum Note (Signed)
Addended by: Vicie Mutters R on: 12/28/2019 11:17 AM   Modules accepted: Orders

## 2019-12-29 DIAGNOSIS — J9611 Chronic respiratory failure with hypoxia: Secondary | ICD-10-CM | POA: Diagnosis not present

## 2019-12-29 DIAGNOSIS — I872 Venous insufficiency (chronic) (peripheral): Secondary | ICD-10-CM | POA: Diagnosis not present

## 2019-12-29 DIAGNOSIS — E43 Unspecified severe protein-calorie malnutrition: Secondary | ICD-10-CM | POA: Diagnosis not present

## 2019-12-29 DIAGNOSIS — Z9981 Dependence on supplemental oxygen: Secondary | ICD-10-CM | POA: Diagnosis not present

## 2019-12-29 DIAGNOSIS — J984 Other disorders of lung: Secondary | ICD-10-CM | POA: Diagnosis not present

## 2019-12-29 DIAGNOSIS — G232 Striatonigral degeneration: Secondary | ICD-10-CM | POA: Diagnosis not present

## 2019-12-29 NOTE — Telephone Encounter (Signed)
Spoke with [facility] will begin today.

## 2020-01-01 DIAGNOSIS — L120 Bullous pemphigoid: Secondary | ICD-10-CM | POA: Diagnosis not present

## 2020-01-02 DIAGNOSIS — G232 Striatonigral degeneration: Secondary | ICD-10-CM | POA: Diagnosis not present

## 2020-01-02 DIAGNOSIS — Z9981 Dependence on supplemental oxygen: Secondary | ICD-10-CM | POA: Diagnosis not present

## 2020-01-02 DIAGNOSIS — J9611 Chronic respiratory failure with hypoxia: Secondary | ICD-10-CM | POA: Diagnosis not present

## 2020-01-02 DIAGNOSIS — E43 Unspecified severe protein-calorie malnutrition: Secondary | ICD-10-CM | POA: Diagnosis not present

## 2020-01-02 DIAGNOSIS — J984 Other disorders of lung: Secondary | ICD-10-CM | POA: Diagnosis not present

## 2020-01-02 DIAGNOSIS — I872 Venous insufficiency (chronic) (peripheral): Secondary | ICD-10-CM | POA: Diagnosis not present

## 2020-01-09 DIAGNOSIS — J9611 Chronic respiratory failure with hypoxia: Secondary | ICD-10-CM | POA: Diagnosis not present

## 2020-01-09 DIAGNOSIS — G232 Striatonigral degeneration: Secondary | ICD-10-CM | POA: Diagnosis not present

## 2020-01-09 DIAGNOSIS — E43 Unspecified severe protein-calorie malnutrition: Secondary | ICD-10-CM | POA: Diagnosis not present

## 2020-01-09 DIAGNOSIS — J984 Other disorders of lung: Secondary | ICD-10-CM | POA: Diagnosis not present

## 2020-01-09 DIAGNOSIS — I872 Venous insufficiency (chronic) (peripheral): Secondary | ICD-10-CM | POA: Diagnosis not present

## 2020-01-09 DIAGNOSIS — Z9981 Dependence on supplemental oxygen: Secondary | ICD-10-CM | POA: Diagnosis not present

## 2020-01-10 ENCOUNTER — Ambulatory Visit: Payer: Medicare Other | Admitting: Physician Assistant

## 2020-01-12 DIAGNOSIS — J9611 Chronic respiratory failure with hypoxia: Secondary | ICD-10-CM | POA: Diagnosis not present

## 2020-01-12 DIAGNOSIS — J984 Other disorders of lung: Secondary | ICD-10-CM | POA: Diagnosis not present

## 2020-01-12 DIAGNOSIS — Z9981 Dependence on supplemental oxygen: Secondary | ICD-10-CM | POA: Diagnosis not present

## 2020-01-12 DIAGNOSIS — G232 Striatonigral degeneration: Secondary | ICD-10-CM | POA: Diagnosis not present

## 2020-01-12 DIAGNOSIS — I872 Venous insufficiency (chronic) (peripheral): Secondary | ICD-10-CM | POA: Diagnosis not present

## 2020-01-12 DIAGNOSIS — E43 Unspecified severe protein-calorie malnutrition: Secondary | ICD-10-CM | POA: Diagnosis not present

## 2020-01-15 ENCOUNTER — Telehealth: Payer: Self-pay | Admitting: *Deleted

## 2020-01-15 DIAGNOSIS — G232 Striatonigral degeneration: Secondary | ICD-10-CM | POA: Diagnosis not present

## 2020-01-15 DIAGNOSIS — J984 Other disorders of lung: Secondary | ICD-10-CM | POA: Diagnosis not present

## 2020-01-15 DIAGNOSIS — Z9981 Dependence on supplemental oxygen: Secondary | ICD-10-CM | POA: Diagnosis not present

## 2020-01-15 DIAGNOSIS — J9611 Chronic respiratory failure with hypoxia: Secondary | ICD-10-CM | POA: Diagnosis not present

## 2020-01-15 DIAGNOSIS — I872 Venous insufficiency (chronic) (peripheral): Secondary | ICD-10-CM | POA: Diagnosis not present

## 2020-01-15 DIAGNOSIS — E43 Unspecified severe protein-calorie malnutrition: Secondary | ICD-10-CM | POA: Diagnosis not present

## 2020-01-15 NOTE — Telephone Encounter (Signed)
Kim called and reported the patient has decided to stop her medications. She reported the large blisters on her legs have spread to her abdomen. The nurse has been applying the bandages to the area, but they are starting to stick to the wounds.  After looking at the report from the [facility], he advised the Hospice nurse to call the [facility] for advise on treating the blisters.

## 2020-01-18 DIAGNOSIS — E43 Unspecified severe protein-calorie malnutrition: Secondary | ICD-10-CM | POA: Diagnosis not present

## 2020-01-18 DIAGNOSIS — R32 Unspecified urinary incontinence: Secondary | ICD-10-CM | POA: Diagnosis not present

## 2020-01-18 DIAGNOSIS — L97811 Non-pressure chronic ulcer of other part of right lower leg limited to breakdown of skin: Secondary | ICD-10-CM | POA: Diagnosis not present

## 2020-01-18 DIAGNOSIS — I872 Venous insufficiency (chronic) (peripheral): Secondary | ICD-10-CM | POA: Diagnosis not present

## 2020-01-18 DIAGNOSIS — J984 Other disorders of lung: Secondary | ICD-10-CM | POA: Diagnosis not present

## 2020-01-18 DIAGNOSIS — Z9981 Dependence on supplemental oxygen: Secondary | ICD-10-CM | POA: Diagnosis not present

## 2020-01-18 DIAGNOSIS — G232 Striatonigral degeneration: Secondary | ICD-10-CM | POA: Diagnosis not present

## 2020-01-18 DIAGNOSIS — J9611 Chronic respiratory failure with hypoxia: Secondary | ICD-10-CM | POA: Diagnosis not present

## 2020-01-19 DIAGNOSIS — I872 Venous insufficiency (chronic) (peripheral): Secondary | ICD-10-CM | POA: Diagnosis not present

## 2020-01-19 DIAGNOSIS — J984 Other disorders of lung: Secondary | ICD-10-CM | POA: Diagnosis not present

## 2020-01-19 DIAGNOSIS — G232 Striatonigral degeneration: Secondary | ICD-10-CM | POA: Diagnosis not present

## 2020-01-19 DIAGNOSIS — E43 Unspecified severe protein-calorie malnutrition: Secondary | ICD-10-CM | POA: Diagnosis not present

## 2020-01-19 DIAGNOSIS — J9611 Chronic respiratory failure with hypoxia: Secondary | ICD-10-CM | POA: Diagnosis not present

## 2020-01-19 DIAGNOSIS — Z9981 Dependence on supplemental oxygen: Secondary | ICD-10-CM | POA: Diagnosis not present

## 2020-01-22 ENCOUNTER — Telehealth: Payer: Medicare Other | Admitting: Neurology

## 2020-01-22 DIAGNOSIS — J984 Other disorders of lung: Secondary | ICD-10-CM | POA: Diagnosis not present

## 2020-01-22 DIAGNOSIS — G232 Striatonigral degeneration: Secondary | ICD-10-CM | POA: Diagnosis not present

## 2020-01-22 DIAGNOSIS — I872 Venous insufficiency (chronic) (peripheral): Secondary | ICD-10-CM | POA: Diagnosis not present

## 2020-01-22 DIAGNOSIS — Z9981 Dependence on supplemental oxygen: Secondary | ICD-10-CM | POA: Diagnosis not present

## 2020-01-22 DIAGNOSIS — E43 Unspecified severe protein-calorie malnutrition: Secondary | ICD-10-CM | POA: Diagnosis not present

## 2020-01-22 DIAGNOSIS — J9611 Chronic respiratory failure with hypoxia: Secondary | ICD-10-CM | POA: Diagnosis not present

## 2020-01-26 DIAGNOSIS — E43 Unspecified severe protein-calorie malnutrition: Secondary | ICD-10-CM | POA: Diagnosis not present

## 2020-01-26 DIAGNOSIS — J9611 Chronic respiratory failure with hypoxia: Secondary | ICD-10-CM | POA: Diagnosis not present

## 2020-01-26 DIAGNOSIS — I872 Venous insufficiency (chronic) (peripheral): Secondary | ICD-10-CM | POA: Diagnosis not present

## 2020-01-26 DIAGNOSIS — G232 Striatonigral degeneration: Secondary | ICD-10-CM | POA: Diagnosis not present

## 2020-01-26 DIAGNOSIS — Z9981 Dependence on supplemental oxygen: Secondary | ICD-10-CM | POA: Diagnosis not present

## 2020-01-26 DIAGNOSIS — J984 Other disorders of lung: Secondary | ICD-10-CM | POA: Diagnosis not present

## 2020-01-29 DIAGNOSIS — Z9981 Dependence on supplemental oxygen: Secondary | ICD-10-CM | POA: Diagnosis not present

## 2020-01-29 DIAGNOSIS — G232 Striatonigral degeneration: Secondary | ICD-10-CM | POA: Diagnosis not present

## 2020-01-29 DIAGNOSIS — J9611 Chronic respiratory failure with hypoxia: Secondary | ICD-10-CM | POA: Diagnosis not present

## 2020-01-29 DIAGNOSIS — E43 Unspecified severe protein-calorie malnutrition: Secondary | ICD-10-CM | POA: Diagnosis not present

## 2020-01-29 DIAGNOSIS — I872 Venous insufficiency (chronic) (peripheral): Secondary | ICD-10-CM | POA: Diagnosis not present

## 2020-01-29 DIAGNOSIS — J984 Other disorders of lung: Secondary | ICD-10-CM | POA: Diagnosis not present

## 2020-01-31 DIAGNOSIS — Z9981 Dependence on supplemental oxygen: Secondary | ICD-10-CM | POA: Diagnosis not present

## 2020-01-31 DIAGNOSIS — G232 Striatonigral degeneration: Secondary | ICD-10-CM | POA: Diagnosis not present

## 2020-01-31 DIAGNOSIS — E43 Unspecified severe protein-calorie malnutrition: Secondary | ICD-10-CM | POA: Diagnosis not present

## 2020-01-31 DIAGNOSIS — I872 Venous insufficiency (chronic) (peripheral): Secondary | ICD-10-CM | POA: Diagnosis not present

## 2020-01-31 DIAGNOSIS — J984 Other disorders of lung: Secondary | ICD-10-CM | POA: Diagnosis not present

## 2020-01-31 DIAGNOSIS — J9611 Chronic respiratory failure with hypoxia: Secondary | ICD-10-CM | POA: Diagnosis not present

## 2020-02-01 DIAGNOSIS — J984 Other disorders of lung: Secondary | ICD-10-CM | POA: Diagnosis not present

## 2020-02-01 DIAGNOSIS — Z9981 Dependence on supplemental oxygen: Secondary | ICD-10-CM | POA: Diagnosis not present

## 2020-02-01 DIAGNOSIS — G232 Striatonigral degeneration: Secondary | ICD-10-CM | POA: Diagnosis not present

## 2020-02-01 DIAGNOSIS — I872 Venous insufficiency (chronic) (peripheral): Secondary | ICD-10-CM | POA: Diagnosis not present

## 2020-02-01 DIAGNOSIS — J9611 Chronic respiratory failure with hypoxia: Secondary | ICD-10-CM | POA: Diagnosis not present

## 2020-02-01 DIAGNOSIS — E43 Unspecified severe protein-calorie malnutrition: Secondary | ICD-10-CM | POA: Diagnosis not present

## 2020-02-02 ENCOUNTER — Telehealth: Payer: Self-pay | Admitting: Neurology

## 2020-02-02 ENCOUNTER — Ambulatory Visit: Payer: Medicare Other | Admitting: Orthopedic Surgery

## 2020-02-02 DIAGNOSIS — I872 Venous insufficiency (chronic) (peripheral): Secondary | ICD-10-CM | POA: Diagnosis not present

## 2020-02-02 DIAGNOSIS — G232 Striatonigral degeneration: Secondary | ICD-10-CM | POA: Diagnosis not present

## 2020-02-02 DIAGNOSIS — Z9981 Dependence on supplemental oxygen: Secondary | ICD-10-CM | POA: Diagnosis not present

## 2020-02-02 DIAGNOSIS — E43 Unspecified severe protein-calorie malnutrition: Secondary | ICD-10-CM | POA: Diagnosis not present

## 2020-02-02 DIAGNOSIS — J9611 Chronic respiratory failure with hypoxia: Secondary | ICD-10-CM | POA: Diagnosis not present

## 2020-02-02 DIAGNOSIS — J984 Other disorders of lung: Secondary | ICD-10-CM | POA: Diagnosis not present

## 2020-02-17 NOTE — Telephone Encounter (Signed)
Patient's daughter called to let Dr. Posey Pronto know the patient passed away this morning.

## 2020-02-17 NOTE — Telephone Encounter (Signed)
Personally called and expressed condolences.

## 2020-02-17 DEATH — deceased

## 2020-06-17 ENCOUNTER — Encounter: Payer: Medicare Other | Admitting: Internal Medicine

## 2020-12-23 ENCOUNTER — Ambulatory Visit: Payer: Medicare Other | Admitting: Physician Assistant
